# Patient Record
Sex: Male | Born: 1965 | Race: Black or African American | Hispanic: No | Marital: Married | Smoking: Current every day smoker
Health system: Southern US, Community
[De-identification: ages and names within clinical notes are randomized; demographics above are authoritative.]

## PROBLEM LIST (undated history)

## (undated) ENCOUNTER — Emergency Department (HOSPITAL_COMMUNITY)
Admission: EM | Disposition: A | Discharge status: Home / Self Care | Payer: Medicaid Other | Attending: Emergency Medicine | Admitting: Emergency Medicine

## (undated) DIAGNOSIS — E785 Hyperlipidemia, unspecified: Secondary | ICD-10-CM

## (undated) DIAGNOSIS — K852 Alcohol induced acute pancreatitis without necrosis or infection: Secondary | ICD-10-CM

## (undated) DIAGNOSIS — F329 Major depressive disorder, single episode, unspecified: Secondary | ICD-10-CM

## (undated) DIAGNOSIS — G8929 Other chronic pain: Secondary | ICD-10-CM

## (undated) DIAGNOSIS — F32A Depression, unspecified: Secondary | ICD-10-CM

## (undated) DIAGNOSIS — R519 Headache, unspecified: Secondary | ICD-10-CM

## (undated) DIAGNOSIS — G4730 Sleep apnea, unspecified: Secondary | ICD-10-CM

## (undated) DIAGNOSIS — I739 Peripheral vascular disease, unspecified: Secondary | ICD-10-CM

## (undated) DIAGNOSIS — M1990 Unspecified osteoarthritis, unspecified site: Secondary | ICD-10-CM

## (undated) DIAGNOSIS — E119 Type 2 diabetes mellitus without complications: Secondary | ICD-10-CM

## (undated) DIAGNOSIS — I10 Essential (primary) hypertension: Secondary | ICD-10-CM

## (undated) DIAGNOSIS — R51 Headache: Secondary | ICD-10-CM

## (undated) DIAGNOSIS — I639 Cerebral infarction, unspecified: Secondary | ICD-10-CM

## (undated) DIAGNOSIS — K219 Gastro-esophageal reflux disease without esophagitis: Secondary | ICD-10-CM

## (undated) DIAGNOSIS — I509 Heart failure, unspecified: Secondary | ICD-10-CM

## (undated) HISTORY — DX: Essential (primary) hypertension: I10

## (undated) HISTORY — DX: Cerebral infarction, unspecified: I63.9

## (undated) HISTORY — DX: Peripheral vascular disease, unspecified: I73.9

## (undated) HISTORY — DX: Alcohol induced acute pancreatitis without necrosis or infection: K85.20

## (undated) HISTORY — PX: OTHER SURGICAL HISTORY: SHX169

## (undated) HISTORY — DX: Other chronic pain: G89.29

## (undated) HISTORY — DX: Gastro-esophageal reflux disease without esophagitis: K21.9

## (undated) HISTORY — PX: CLOSED REDUCTION MANDIBULAR FRACTURE W/ ARCH BARS: SHX1360

---

## 2002-06-20 ENCOUNTER — Encounter: Payer: Self-pay | Admitting: *Deleted

## 2002-06-20 ENCOUNTER — Inpatient Hospital Stay (HOSPITAL_COMMUNITY): Admission: EM | Admit: 2002-06-20 | Discharge: 2002-06-21 | Payer: Self-pay

## 2002-06-21 ENCOUNTER — Encounter: Payer: Self-pay | Admitting: *Deleted

## 2002-06-24 ENCOUNTER — Ambulatory Visit (HOSPITAL_COMMUNITY): Admission: RE | Admit: 2002-06-24 | Discharge: 2002-06-24 | Payer: Self-pay | Admitting: Cardiovascular Disease

## 2003-08-09 ENCOUNTER — Emergency Department (HOSPITAL_COMMUNITY): Admission: EM | Admit: 2003-08-09 | Discharge: 2003-08-09 | Payer: Self-pay | Admitting: Emergency Medicine

## 2003-11-08 ENCOUNTER — Emergency Department (HOSPITAL_COMMUNITY): Admission: EM | Admit: 2003-11-08 | Discharge: 2003-11-08 | Payer: Self-pay | Admitting: Emergency Medicine

## 2005-07-30 ENCOUNTER — Emergency Department (HOSPITAL_COMMUNITY): Admission: EM | Admit: 2005-07-30 | Discharge: 2005-07-30 | Payer: Self-pay | Admitting: Emergency Medicine

## 2006-08-31 ENCOUNTER — Emergency Department (HOSPITAL_COMMUNITY): Admission: EM | Admit: 2006-08-31 | Discharge: 2006-08-31 | Payer: Self-pay | Admitting: Emergency Medicine

## 2006-09-20 ENCOUNTER — Emergency Department (HOSPITAL_COMMUNITY): Admission: EM | Admit: 2006-09-20 | Discharge: 2006-09-20 | Payer: Self-pay | Admitting: Emergency Medicine

## 2007-01-23 ENCOUNTER — Emergency Department (HOSPITAL_COMMUNITY): Admission: EM | Admit: 2007-01-23 | Discharge: 2007-01-23 | Payer: Self-pay | Admitting: Emergency Medicine

## 2007-02-16 ENCOUNTER — Emergency Department (HOSPITAL_COMMUNITY): Admission: EM | Admit: 2007-02-16 | Discharge: 2007-02-16 | Payer: Self-pay | Admitting: Emergency Medicine

## 2007-03-26 ENCOUNTER — Emergency Department (HOSPITAL_COMMUNITY): Admission: EM | Admit: 2007-03-26 | Discharge: 2007-03-26 | Payer: Self-pay | Admitting: Emergency Medicine

## 2007-04-30 ENCOUNTER — Emergency Department (HOSPITAL_COMMUNITY): Admission: EM | Admit: 2007-04-30 | Discharge: 2007-04-30 | Payer: Self-pay | Admitting: Emergency Medicine

## 2007-05-13 ENCOUNTER — Emergency Department (HOSPITAL_COMMUNITY): Admission: EM | Admit: 2007-05-13 | Discharge: 2007-05-13 | Payer: Self-pay | Admitting: Emergency Medicine

## 2007-06-16 ENCOUNTER — Emergency Department (HOSPITAL_COMMUNITY): Admission: EM | Admit: 2007-06-16 | Discharge: 2007-06-16 | Payer: Self-pay | Admitting: Emergency Medicine

## 2007-06-27 ENCOUNTER — Emergency Department (HOSPITAL_COMMUNITY): Admission: EM | Admit: 2007-06-27 | Discharge: 2007-06-27 | Payer: Self-pay | Admitting: *Deleted

## 2007-10-15 ENCOUNTER — Emergency Department (HOSPITAL_COMMUNITY): Admission: EM | Admit: 2007-10-15 | Discharge: 2007-10-16 | Payer: Self-pay | Admitting: Emergency Medicine

## 2008-06-23 ENCOUNTER — Emergency Department (HOSPITAL_COMMUNITY): Admission: EM | Admit: 2008-06-23 | Discharge: 2008-06-23 | Payer: Self-pay | Admitting: Emergency Medicine

## 2008-08-05 ENCOUNTER — Emergency Department (HOSPITAL_COMMUNITY): Admission: EM | Admit: 2008-08-05 | Discharge: 2008-08-05 | Payer: Self-pay | Admitting: Emergency Medicine

## 2008-09-27 ENCOUNTER — Emergency Department (HOSPITAL_COMMUNITY): Admission: EM | Admit: 2008-09-27 | Discharge: 2008-09-27 | Payer: Self-pay | Admitting: Emergency Medicine

## 2008-11-17 ENCOUNTER — Emergency Department (HOSPITAL_COMMUNITY): Admission: EM | Admit: 2008-11-17 | Discharge: 2008-11-17 | Payer: Self-pay | Admitting: Emergency Medicine

## 2008-12-07 ENCOUNTER — Ambulatory Visit (HOSPITAL_COMMUNITY): Admission: RE | Admit: 2008-12-07 | Discharge: 2008-12-07 | Payer: Self-pay | Admitting: Orthopaedic Surgery

## 2008-12-16 ENCOUNTER — Ambulatory Visit (HOSPITAL_COMMUNITY): Admission: RE | Admit: 2008-12-16 | Discharge: 2008-12-16 | Payer: Self-pay | Admitting: Orthopaedic Surgery

## 2009-02-07 ENCOUNTER — Inpatient Hospital Stay (HOSPITAL_COMMUNITY): Admission: RE | Admit: 2009-02-07 | Discharge: 2009-02-08 | Payer: Self-pay | Admitting: Orthopaedic Surgery

## 2009-02-20 ENCOUNTER — Encounter (HOSPITAL_COMMUNITY): Admission: RE | Admit: 2009-02-20 | Discharge: 2009-03-22 | Payer: Self-pay | Admitting: Orthopaedic Surgery

## 2009-02-26 ENCOUNTER — Emergency Department (HOSPITAL_COMMUNITY): Admission: EM | Admit: 2009-02-26 | Discharge: 2009-02-26 | Payer: Self-pay | Admitting: Emergency Medicine

## 2009-03-23 ENCOUNTER — Encounter (HOSPITAL_COMMUNITY): Admission: RE | Admit: 2009-03-23 | Discharge: 2009-05-03 | Payer: Self-pay | Admitting: Orthopaedic Surgery

## 2009-05-05 ENCOUNTER — Encounter (HOSPITAL_COMMUNITY): Admission: RE | Admit: 2009-05-05 | Discharge: 2009-06-04 | Payer: Self-pay | Admitting: Orthopaedic Surgery

## 2009-07-06 ENCOUNTER — Inpatient Hospital Stay (HOSPITAL_COMMUNITY): Admission: RE | Admit: 2009-07-06 | Discharge: 2009-07-07 | Payer: Self-pay | Admitting: Orthopaedic Surgery

## 2009-09-10 ENCOUNTER — Emergency Department (HOSPITAL_COMMUNITY): Admission: EM | Admit: 2009-09-10 | Discharge: 2009-09-10 | Payer: Self-pay | Admitting: Emergency Medicine

## 2009-09-11 ENCOUNTER — Emergency Department (HOSPITAL_COMMUNITY): Admission: EM | Admit: 2009-09-11 | Discharge: 2009-09-11 | Payer: Self-pay | Admitting: Emergency Medicine

## 2009-09-12 ENCOUNTER — Emergency Department (HOSPITAL_COMMUNITY): Admission: EM | Admit: 2009-09-12 | Discharge: 2009-09-12 | Payer: Self-pay | Admitting: Emergency Medicine

## 2009-12-07 ENCOUNTER — Emergency Department (HOSPITAL_COMMUNITY): Admission: EM | Admit: 2009-12-07 | Discharge: 2009-12-07 | Payer: Self-pay | Admitting: Emergency Medicine

## 2010-03-27 ENCOUNTER — Encounter (HOSPITAL_COMMUNITY): Admission: RE | Admit: 2010-03-27 | Discharge: 2010-04-26 | Payer: Self-pay | Admitting: Orthopaedic Surgery

## 2010-04-27 ENCOUNTER — Encounter (HOSPITAL_COMMUNITY): Admission: RE | Admit: 2010-04-27 | Discharge: 2010-05-27 | Payer: Self-pay | Admitting: Orthopaedic Surgery

## 2010-08-20 ENCOUNTER — Emergency Department (HOSPITAL_COMMUNITY): Admission: EM | Admit: 2010-08-20 | Discharge: 2010-08-20 | Payer: Self-pay | Admitting: Emergency Medicine

## 2010-11-20 ENCOUNTER — Emergency Department (HOSPITAL_COMMUNITY)
Admission: EM | Admit: 2010-11-20 | Discharge: 2010-11-20 | Payer: Self-pay | Source: Home / Self Care | Admitting: Emergency Medicine

## 2010-12-30 ENCOUNTER — Encounter: Payer: Self-pay | Admitting: Orthopaedic Surgery

## 2011-01-04 ENCOUNTER — Emergency Department (HOSPITAL_COMMUNITY)
Admission: EM | Admit: 2011-01-04 | Discharge: 2011-01-04 | Payer: Self-pay | Source: Home / Self Care | Admitting: Emergency Medicine

## 2011-01-12 ENCOUNTER — Emergency Department (HOSPITAL_COMMUNITY)
Admission: EM | Admit: 2011-01-12 | Discharge: 2011-01-12 | Disposition: A | Discharge status: Home / Self Care | Payer: Medicaid Other | Attending: Emergency Medicine | Admitting: Emergency Medicine

## 2011-01-12 ENCOUNTER — Emergency Department (HOSPITAL_COMMUNITY): Admit: 2011-01-12 | Discharge: 2011-01-12 | Disposition: A | Discharge status: Home / Self Care | Payer: Medicaid Other

## 2011-01-12 DIAGNOSIS — R05 Cough: Secondary | ICD-10-CM | POA: Insufficient documentation

## 2011-01-12 DIAGNOSIS — M549 Dorsalgia, unspecified: Secondary | ICD-10-CM | POA: Insufficient documentation

## 2011-01-12 DIAGNOSIS — R059 Cough, unspecified: Secondary | ICD-10-CM | POA: Insufficient documentation

## 2011-01-12 DIAGNOSIS — I10 Essential (primary) hypertension: Secondary | ICD-10-CM | POA: Insufficient documentation

## 2011-01-12 DIAGNOSIS — Z79899 Other long term (current) drug therapy: Secondary | ICD-10-CM | POA: Insufficient documentation

## 2011-01-12 DIAGNOSIS — R0602 Shortness of breath: Secondary | ICD-10-CM | POA: Insufficient documentation

## 2011-02-06 ENCOUNTER — Encounter (HOSPITAL_COMMUNITY): Payer: Medicaid Other

## 2011-02-06 ENCOUNTER — Other Ambulatory Visit: Payer: Self-pay | Admitting: General Surgery

## 2011-02-06 LAB — BASIC METABOLIC PANEL
Calcium: 9.2 mg/dL (ref 8.4–10.5)
GFR calc Af Amer: >60 mL/min (ref >60)
GFR calc non Af Amer: >60 mL/min (ref >60)
Glucose, Bld: 89 mg/dL (ref 70–99)
Sodium: 138 mEq/L (ref 135–145)

## 2011-02-06 LAB — CBC
HCT: 44.6 % (ref 39.0–52.0)
MCHC: 33.6 g/dL (ref 30.0–36.0)
RDW: 13.7 % (ref 11.5–15.5)

## 2011-02-13 ENCOUNTER — Other Ambulatory Visit: Payer: Self-pay | Admitting: General Surgery

## 2011-02-13 ENCOUNTER — Ambulatory Visit (HOSPITAL_COMMUNITY)
Admission: RE | Admit: 2011-02-13 | Discharge: 2011-02-13 | Disposition: A | Discharge status: Home / Self Care | Payer: Medicaid Other | Source: Ambulatory Visit | Attending: General Surgery | Admitting: General Surgery

## 2011-02-13 DIAGNOSIS — A630 Anogenital (venereal) warts: Secondary | ICD-10-CM | POA: Insufficient documentation

## 2011-02-13 DIAGNOSIS — I10 Essential (primary) hypertension: Secondary | ICD-10-CM | POA: Insufficient documentation

## 2011-02-13 DIAGNOSIS — Z79899 Other long term (current) drug therapy: Secondary | ICD-10-CM | POA: Insufficient documentation

## 2011-03-02 ENCOUNTER — Emergency Department (HOSPITAL_COMMUNITY)
Admission: EM | Admit: 2011-03-02 | Discharge: 2011-03-03 | Disposition: A | Discharge status: Home / Self Care | Payer: Medicaid Other | Source: Home / Self Care | Attending: Emergency Medicine | Admitting: Emergency Medicine

## 2011-03-02 DIAGNOSIS — Y838 Other surgical procedures as the cause of abnormal reaction of the patient, or of later complication, without mention of misadventure at the time of the procedure: Secondary | ICD-10-CM | POA: Insufficient documentation

## 2011-03-02 DIAGNOSIS — IMO0002 Reserved for concepts with insufficient information to code with codable children: Secondary | ICD-10-CM | POA: Insufficient documentation

## 2011-03-02 DIAGNOSIS — Y849 Medical procedure, unspecified as the cause of abnormal reaction of the patient, or of later complication, without mention of misadventure at the time of the procedure: Secondary | ICD-10-CM | POA: Insufficient documentation

## 2011-03-02 DIAGNOSIS — S3130XA Unspecified open wound of scrotum and testes, initial encounter: Secondary | ICD-10-CM | POA: Insufficient documentation

## 2011-03-03 ENCOUNTER — Emergency Department (HOSPITAL_COMMUNITY)
Admission: EM | Admit: 2011-03-03 | Discharge: 2011-03-03 | Disposition: A | Discharge status: Home / Self Care | Payer: Medicaid Other | Attending: Emergency Medicine | Admitting: Emergency Medicine

## 2011-03-17 LAB — COMPREHENSIVE METABOLIC PANEL
AST: 43 U/L — ABNORMAL HIGH (ref 0–37)
Albumin: 4 g/dL (ref 3.5–5.2)
Calcium: 9.7 mg/dL (ref 8.4–10.5)
Chloride: 107 mEq/L (ref 96–112)
Creatinine, Ser: 0.97 mg/dL (ref 0.4–1.5)
GFR calc Af Amer: >60 mL/min (ref >60)
Total Bilirubin: 0.5 mg/dL (ref 0.3–1.2)
Total Protein: 6.8 g/dL (ref 6.0–8.3)

## 2011-03-17 LAB — URINALYSIS, MICROSCOPIC ONLY
Glucose, UA: NEGATIVE mg/dL
Ketones, ur: NEGATIVE mg/dL
Specific Gravity, Urine: >1.03 — ABNORMAL HIGH (ref 1.005–1.030)
pH: 5.5 (ref 5.0–8.0)

## 2011-03-17 LAB — CBC
MCV: 93.3 fL (ref 78.0–100.0)
Platelets: 205 10*3/uL (ref 150–400)
RDW: 14.7 % (ref 11.5–15.5)
WBC: 5.3 10*3/uL (ref 4.0–10.5)

## 2011-03-17 LAB — DIFFERENTIAL
Eosinophils Relative: 1 % (ref 0–5)
Lymphocytes Relative: 27 % (ref 12–46)
Lymphs Abs: 1.4 10*3/uL (ref 0.7–4.0)
Monocytes Absolute: 0.3 10*3/uL (ref 0.1–1.0)
Monocytes Relative: 5 % (ref 3–12)

## 2011-03-17 LAB — GLUCOSE, CAPILLARY: Glucose-Capillary: 106 mg/dL — ABNORMAL HIGH (ref 70–99)

## 2011-03-26 LAB — CBC
MCV: 92.2 fL (ref 78.0–100.0)
Platelets: 237 10*3/uL (ref 150–400)
RDW: 13.6 % (ref 11.5–15.5)
WBC: 5.4 10*3/uL (ref 4.0–10.5)

## 2011-03-26 LAB — COMPREHENSIVE METABOLIC PANEL
AST: 39 U/L — ABNORMAL HIGH (ref 0–37)
Albumin: 4.1 g/dL (ref 3.5–5.2)
Chloride: 103 mEq/L (ref 96–112)
Creatinine, Ser: 0.89 mg/dL (ref 0.4–1.5)
GFR calc Af Amer: >60 mL/min (ref >60)
Potassium: 3.9 mEq/L (ref 3.5–5.1)
Total Bilirubin: 0.5 mg/dL (ref 0.3–1.2)
Total Protein: 6.6 g/dL (ref 6.0–8.3)

## 2011-03-26 LAB — URINALYSIS, ROUTINE W REFLEX MICROSCOPIC
Bilirubin Urine: NEGATIVE
Glucose, UA: NEGATIVE mg/dL
Ketones, ur: NEGATIVE mg/dL
Nitrite: NEGATIVE
Specific Gravity, Urine: >1.03 — ABNORMAL HIGH (ref 1.005–1.030)
pH: 5.5 (ref 5.0–8.0)

## 2011-03-26 LAB — DIFFERENTIAL
Basophils Absolute: 0 10*3/uL (ref 0.0–0.1)
Eosinophils Relative: 1 % (ref 0–5)
Lymphocytes Relative: 30 % (ref 12–46)
Monocytes Absolute: 0.4 10*3/uL (ref 0.1–1.0)
Monocytes Relative: 8 % (ref 3–12)
Neutro Abs: 3.3 10*3/uL (ref 1.7–7.7)

## 2011-04-01 ENCOUNTER — Emergency Department (HOSPITAL_COMMUNITY): Payer: Medicaid Other

## 2011-04-01 ENCOUNTER — Emergency Department (HOSPITAL_COMMUNITY)
Admission: EM | Admit: 2011-04-01 | Discharge: 2011-04-02 | Disposition: A | Discharge status: Home / Self Care | Payer: Medicaid Other | Attending: Emergency Medicine | Admitting: Emergency Medicine

## 2011-04-01 DIAGNOSIS — H5316 Psychophysical visual disturbances: Secondary | ICD-10-CM | POA: Insufficient documentation

## 2011-04-01 DIAGNOSIS — Z79899 Other long term (current) drug therapy: Secondary | ICD-10-CM | POA: Insufficient documentation

## 2011-04-01 DIAGNOSIS — G8929 Other chronic pain: Secondary | ICD-10-CM | POA: Insufficient documentation

## 2011-04-01 DIAGNOSIS — R51 Headache: Secondary | ICD-10-CM | POA: Insufficient documentation

## 2011-04-01 DIAGNOSIS — R079 Chest pain, unspecified: Secondary | ICD-10-CM | POA: Insufficient documentation

## 2011-04-01 DIAGNOSIS — R404 Transient alteration of awareness: Secondary | ICD-10-CM | POA: Insufficient documentation

## 2011-04-01 DIAGNOSIS — I10 Essential (primary) hypertension: Secondary | ICD-10-CM | POA: Insufficient documentation

## 2011-04-01 DIAGNOSIS — G4730 Sleep apnea, unspecified: Secondary | ICD-10-CM | POA: Insufficient documentation

## 2011-04-01 DIAGNOSIS — M549 Dorsalgia, unspecified: Secondary | ICD-10-CM | POA: Insufficient documentation

## 2011-04-01 LAB — BASIC METABOLIC PANEL
CO2: 26 mEq/L (ref 19–32)
Chloride: 106 mEq/L (ref 96–112)
GFR calc non Af Amer: >60 mL/min (ref >60)
Glucose, Bld: 125 mg/dL — ABNORMAL HIGH (ref 70–99)
Potassium: 3.5 mEq/L (ref 3.5–5.1)
Sodium: 139 mEq/L (ref 135–145)

## 2011-04-01 LAB — URINALYSIS, ROUTINE W REFLEX MICROSCOPIC
Glucose, UA: NEGATIVE mg/dL
Hgb urine dipstick: NEGATIVE
Ketones, ur: NEGATIVE mg/dL
Leukocytes, UA: NEGATIVE
Protein, ur: 30 mg/dL — AB

## 2011-04-01 LAB — CBC
HCT: 45.3 % (ref 39.0–52.0)
Hemoglobin: 15.9 g/dL (ref 13.0–17.0)
MCV: 92.6 fL (ref 78.0–100.0)
Platelets: 220 10*3/uL (ref 150–400)
RBC: 4.89 MIL/uL (ref 4.22–5.81)
WBC: 7 10*3/uL (ref 4.0–10.5)

## 2011-04-01 LAB — DIFFERENTIAL
Lymphocytes Relative: 19 % (ref 12–46)
Lymphs Abs: 1.3 10*3/uL (ref 0.7–4.0)
Neutro Abs: 5 10*3/uL (ref 1.7–7.7)
Neutrophils Relative %: 71 % (ref 43–77)

## 2011-04-01 LAB — RAPID URINE DRUG SCREEN, HOSP PERFORMED
Benzodiazepines: NOT DETECTED
Cocaine: NOT DETECTED

## 2011-04-01 LAB — POCT CARDIAC MARKERS
CKMB, poc: 2.4 ng/mL (ref 1.0–8.0)
Myoglobin, poc: 151 ng/mL (ref 12–200)
Troponin i, poc: <0.05 ng/mL (ref 0.00–0.09)

## 2011-04-04 ENCOUNTER — Emergency Department (HOSPITAL_COMMUNITY): Payer: Medicaid Other

## 2011-04-04 ENCOUNTER — Emergency Department (HOSPITAL_COMMUNITY)
Admission: EM | Admit: 2011-04-04 | Discharge: 2011-04-04 | Disposition: A | Discharge status: Home / Self Care | Payer: Medicaid Other | Attending: Emergency Medicine | Admitting: Emergency Medicine

## 2011-04-04 DIAGNOSIS — X58XXXA Exposure to other specified factors, initial encounter: Secondary | ICD-10-CM | POA: Insufficient documentation

## 2011-04-04 DIAGNOSIS — S335XXA Sprain of ligaments of lumbar spine, initial encounter: Secondary | ICD-10-CM | POA: Insufficient documentation

## 2011-04-08 NOTE — Op Note (Signed)
  NAME:  Taylor Obrien, Taylor Obrien NO.:  [id number]  MEDICAL RECORD NO.:  [id number]           PATIENT TYPE:  O  LOCATION:  DAYP                          FACILITY:  [facility]  PHYSICIAN:  Tilford Pillar, MD      DATE OF BIRTH:  1966-10-12  DATE OF PROCEDURE:  02/13/2011 DATE OF DISCHARGE:  02/13/2011                              OPERATIVE REPORT   PREOPERATIVE DIAGNOSIS:  Condyloma of the penis and perineum.  POSTOPERATIVE DIAGNOSIS:  Condyloma of the penis and perineum.  PROCEDURE:  Electrocautery, desiccation of condylomatous warts of the penis and perineum.  SURGEON:  Tilford Pillar, MD  ANESTHESIA:  General endotracheal, local anesthetic 0.5% Sensorcaine plain.  SPECIMEN:  Pedunculated wart sent as permanent specimen to Pathology.  ESTIMATED BLOOD LOSS:  Minimal  DISPOSITION:  Postanesthetic Care Unit.  INDICATIONS:  The patient is a 45 year old male who presented to my office with a history of multiple genitalia warts easily been increasing in size.  He have 1 area which was causing some discomfort, pain, and occasional bleeding.  On evaluation he had multiple warts including one large pedunculated wart of the penile shaft.  Risks, benefits and alternatives of excision and desiccation were discussed at length with the patient including but not limited to the risk of recurrence, bleeding, infection as well as postoperative pain.  His questions and concerns were addressed and the patient was consented for the planned procedure.  OPERATION:  The patient was taken to the operating room.  He was placed in supine position on the operating room table, at which time the general anesthetic was administered.  Once the patient was asleep, he was endotracheally intubated by Anesthesia.  At this point he was placed into a high lithotomy position in bilateral Yellofin stirrups.  His perineum was prepped with Betadine solution and draped in standard fashion.  At this point we  began careful dissection with electrocautery. The pedunculated wart was divided and excised and sent as a permanent specimen.  Multiple warts were meticulously desiccated with the electrocautery.  Due to the number of warts, this process did take a long duration to desiccate all clearly evidencing gross-appearing warts. At this point there was no bleeding.  The area was covered with Silvadene cream and the gauze dressing was placed as best as possible around the genitalia as well as the perineum.  The drapes were removed. The patient was taken down the Yellofin stirrups, placed into the mesh underwear to help secure the dressing and then was transferred back to a regular hospital bed, then transferred to the postanesthetic care unit in stable condition.  At the conclusion of procedure, all instrument, sponge and needle counts were correct.  The patient tolerated the procedure well.     Tilford Pillar, MD     BZ/MEDQ  D:  04/02/2011  T:  04/03/2011  Job:  [id number]  Electronically Signed by Tilford Pillar MD on 04/08/2011 02:24:41 PM

## 2011-04-23 NOTE — Op Note (Signed)
NAME:  Taylor Obrien, Taylor Obrien NO.:  [id number]   MEDICAL RECORD NO.:  [id number]          PATIENT TYPE:  INP   LOCATION:  A306                          FACILITY:  [facility]   PHYSICIAN:  J. Darreld Mclean, M.D. DATE OF BIRTH:  March 24, 1966   DATE OF PROCEDURE:  02/07/2009  DATE OF DISCHARGE:                               OPERATIVE REPORT   PREOPERATIVE DIAGNOSIS:  Tear right rotator cuff.   POSTOPERATIVE DIAGNOSIS:  Tear right rotator cuff.   PROCEDURE:  Open repair right rotator cuff modified Neer acromioplasty  piece of surgical anchor.   ANESTHESIA:  General.   TOURNIQUET:  No tourniquet.   DRAINS:  No drains.   SURGEON:  J. Darreld Mclean, MD   INDICATIONS:  The patient is a 45 year old male with pain and tenderness  in his right shoulder.  MRI shows tear on the right shoulder of the  supraspinatus at its insertion.  Distractor surgery was not successful,  so it is not recommended.  Risks and imponderables of the procedure have  been explained as well as alternatives.   DESCRIPTION OF PROCEDURE:  The patient was seen in the holding area.  The right shoulder was identified as correct surgical site.  We put a  mark on the right shoulder, placed a mark on the right shoulder.  He was  taken to the OR and given general anesthesia while supine.  Sandbag  placed under the right shoulder under the padding of the table.  He was  positioned in a semi-barber position.  He was prepped and draped in the  usual manner.  We had a time-out, identified Taylor Obrien as the patient,  right shoulder as the correct surgical site.  All instrumentation was  was deemed to be properly working.  Surgical team was prepared.  Incision was made between the end of the acromion and the coracoid.  Careful dissection of the deltoid was opened and so-called weak spot was  opened.  Coracoacromial ligament was identified and removed.  Modified  Neer acromioplasty was carried out with broad-based  osteotome.  A power  rasp was used to smooth this out.  Tear with insertion of the  supraspinatus and this was repaired with a surgical anchor.  Good repair  was obtained.  The shoulder was moved.  We have no significant  difficulty or impingement.  The deltoid was repaired with 2-0 chromic  described by near.  Subcutaneous tissue were closed with interrupted 2-0  plain and a running subcuticular 3-0 nylon was used.  Steri-Strips  reinforced to one.  Sterile dressing applied, bulky dressing applied.  It should be noted I  used Marcaine 0.25% into the wound prior to application of the Steri-  Strips.  The patient tolerated the procedure well.  He will be admitted  overnight for pain control.           ______________________________  J. Darreld Mclean, M.D.     JWK/MEDQ  D:  02/07/2009  T:  02/07/2009  Job:  [id number]
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
## 2011-04-23 NOTE — H&P (Signed)
NAME:  Taylor Obrien, Taylor Obrien                 ACCOUNT NO.:  [id number]   MEDICAL RECORD NO.:  [id number]          PATIENT TYPE:  AMB   LOCATION:  DAY                           FACILITY:  [facility]   PHYSICIAN:  J. Darreld Mclean, M.D. DATE OF BIRTH:  May 20, 1966   DATE OF ADMISSION:  07/05/2009  DATE OF DISCHARGE:  LH                              HISTORY & PHYSICAL   CHIEF COMPLAINT:  My shoulder hurts.   SUBJECTIVE:  The patient is a 45 year old male who I did surgery on his  right rotator cuff on 02/07/2009 earlier this year.  He has done well  and has progressed well, but just recently he fell.  He is still having  some pain again in the right shoulder.  Obtained x-rays and the surgical  anchor that I placed had come loose, previously it was well-located.  He  is having pain with motion of his shoulder.  I have shown him the x-  rays, explained what has happened.  The anchor will need to be removed  because it is now a loose body within the joint.  I told him I would do  the surgery, put him in the hospital overnight for pain control and can  let him go the next day.   PAST MEDICAL HISTORY:  1. Positive for a problem with his neck.  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  MRI shows this and it is stable.  2. The patient has a history of hypertension.   ALLERGIES:  He has no allergies.   SOCIAL HISTORY:  He smokes and uses alcohol socially.  His family doctor  is [facility].  The patient lives in [place].  Is  married.  He works for the [employer].   CURRENT MEDICATIONS:  1. Benazepril 40 mg daily.  2. Flexeril 10 mg as needed.  3. Amlodipine 10 mg daily.  4. Labetalol 200 mg daily.  5. The patient has recently been on some mild hydrocodone, 7.5.   OBJECTIVE:  VITAL SIGNS:  The patient is 5 feet 6 inches, weight 225  pounds, pulse 84, respirations 16, afebrile.  GENERAL:  The  patient is alert, cooperative and oriented.  HEENT: Negative.  NECK:  Supple.  LUNGS:  Clear to P and A.  HEART: Regular without murmur heard.  ABDOMEN:  Soft, nontender without masses.  He is obese, stocky built.  EXTREMITIES:  Range of motion of the right shoulder is good.  He can  abduct to about 90-100 degrees, forward flex to 120, but has pain and  will wince at times, other times he does not.  He has a well-healed  scar.  Neurovascularly is intact.  Other extremities are negative.  CNS: Intact.  SKIN:  Intact.   IMPRESSION:  Status post right rotator cuff tear with loose surgical  anchor.   PLAN:  Removal of surgical anchor.  I will do this under general  anesthesia.  Admit the patient  overnight for pain control.  Labs are  pending.                                            ______________________________  J. Darreld Mclean, M.D.     JWK/MEDQ  D:  07/05/2009  T:  07/05/2009  Job:  [id number]

## 2011-04-23 NOTE — Discharge Summary (Signed)
NAME:  Taylor Obrien, SUSTAITA NO.:  [id number]   MEDICAL RECORD NO.:  [id number]          PATIENT TYPE:  INP   LOCATION:  A306                          FACILITY:  [facility]   PHYSICIAN:  J. Darreld Mclean, M.D. DATE OF BIRTH:  1966-05-18   DATE OF ADMISSION:  02/07/2009  DATE OF DISCHARGE:  LH                               DISCHARGE SUMMARY   DISCHARGE DIAGNOSIS:  Rotator cuff tear, right.   PROCEDURE PERFORMED:  Open repair of rotator cuff tear on the right.   DISCHARGE ASSESSMENT:  Appropriate.   PROGNOSIS:  Good.   DISPOSITION:  To home.  To be seen in my office on March 15 at 9:00 for  suture removal.   WOUND CARE:  Thin dressing applied.  To keep the wound dry.  Use of  Cryo/Cuff.  He will be shown how to use the Cryo/Cuff prior to  discharge.   DISCHARGE MEDICATIONS:  1. Percocet 7.5/325 p.o. q.4h. has been added.  2. He is to stop his hydrocodone.  3. Resume all the other medicines.  Please see medical reconciliation      sheet.   BRIEF HISTORY:  The patient was admitted and underwent the above-  mentioned procedure.  He tolerated it well.  Postop, his pain has been  well-controlled with a PCA pump.  Neurovascular is intact.  He slept  well.  Neurovascular remained intact.  He remained afebrile.  Vital  signs are stable.  Care of the wound is as stated.  I will see him in  the office on March 15.  If any difficulties, to contact me through the  office or hospital.  The beeper system numbers have been provided.                                            ______________________________  Shela Commons. Darreld Mclean, M.D.     JWK/MEDQ  D:  02/08/2009  T:  02/08/2009  Job:  [id number]

## 2011-04-23 NOTE — Op Note (Signed)
NAME:  Taylor Obrien, Taylor Obrien                 ACCOUNT NO.:  [id number]   MEDICAL RECORD NO.:  [id number]          PATIENT TYPE:  INP   LOCATION:  A308                          FACILITY:  [facility]   PHYSICIAN:  J. Darreld Mclean, M.D. DATE OF BIRTH:  10-23-1966   DATE OF PROCEDURE:  07/06/2009  DATE OF DISCHARGE:                               OPERATIVE REPORT   PREOPERATIVE DIAGNOSIS:  Status post rotator cuff repair on the right  shoulder now with loose surgical anchor within the right shoulder joint.   POSTOPERATIVE DIAGNOSIS:  Status post rotator cuff repair on the right  shoulder now with loose surgical anchor within the right shoulder joint.   PROCEDURE:  Removal of foreign body (loose surgical anchor) right  shoulder.   ANESTHESIA:  General.   SURGEON:  J. Darreld Mclean, MD   DRAINS:  No drains.   INDICATIONS:  The patient is a 45 year old male who had shoulder surgery  in March.  He had a rotator cuff tear.  He has done very well until  recently when he fell.  He had increased pain and tenderness to his  shoulder.  His motion have been very good and his strength was  returning.  I got x-ray of the office last week and showed that the  surgical anchor was displaced within the joint.  I recommended this  removal.  The patient thought about it over the weekend and came back  and saw me Monday and elected to have the ankle removed.  I told him it  has an impingement on all loose body including the joint.  Risks and  imponderables procedure were discussed preoperatively at that time.  He  asked appropriate questions and appeared to understand.  I told him I  got to limit the incision as small as possible so that we can get the  anchor out or reopen the entire wound as originally done.   DESCRIPTION OF PROCEDURE:  The patient was seen in the holding area.  The right shoulder was identified as correct surgical site.  He placed a  mark on the right shoulder.  I placed a mark on the right  shoulder.  He  was brought to the operating room and given general anesthesia while  supine.  He was placed in the semi-barber position on the table.  He was  prepped and draped in the usual manner.  We had a time-out, identified  the patient as Mr. Maragh and the right shoulder as the correct surgical  site.  All instrumentation was deemed to be properly positioned and  working.  Operating room team knew each other.  A C-arm was brought in  first, but due to the metal on the table, we could not visualize the  shoulder well, therefore this was removed and regular x-ray was brought  in.  I got to scalp film after he was prepped and draped and I put 3  needles and tried to localize where the anchor was.  It was closed with  the largest needle.  We made a small incision over this area, but  I  could not readily identify.  Then repeated x-rays again with needles in  place and then got also a lateral view.  This isolated it better.  It  took a little while, but with careful dissection, I was able to find the  anchor and removed it.  Part of the tear was still attached and I  removed this portion of the suture.  Rotator cuff appeared to be intact  and was one area, of course, where the anchor had come through, but  other than that the rotator cuff looked good.  Repaired this with  Surgilon suture #1.  The deltoid was repaired with 2-0 chromic.  The  subcutaneous tissues reapproximated using 2-0 chromic and then a  running subcuticular 3-0 nylon.  Sterile dressing applied.  The patient  tolerated the procedure well, went to recovery in good condition.  He  had been placed in the hospital overnight on observation to make sure  there is pain control.  We will let him to go home tomorrow if he is  doing well.            ______________________________  J. Darreld Mclean, M.D.     JWK/MEDQ  D:  07/06/2009  T:  07/07/2009  Job:  [id number]

## 2011-04-23 NOTE — Discharge Summary (Signed)
NAME:  Taylor Obrien, Taylor Obrien                 ACCOUNT NO.:  [id number]   MEDICAL RECORD NO.:  [id number]          PATIENT TYPE:  INP   LOCATION:  A308                          FACILITY:  [facility]   PHYSICIAN:  J. Darreld Mclean, M.D. DATE OF BIRTH:  12/13/1965   DATE OF ADMISSION:  07/06/2009  DATE OF DISCHARGE:  07/30/2010LH                               DISCHARGE SUMMARY   DISCHARGE DIAGNOSIS:  Status post rotator cuff repair on the right with  a loose orthopedic device (surgical anchor).   DISCHARGE STATUS:  Improved and sent home.   DISCHARGE MEDICATIONS:  Percocet 7.5/325 one every 2 hours p.r.n. pain.   PROCEDURE PERFORMED:  Removal of surgical anchor in the right shoulder.   The patient was seen in my office in approximately 10 days, 2 weeks.   Wound care has been explained.   Begin gentle range of motion as tolerated.   Use a sling as tolerated.   BRIEF HISTORY:  The patient had surgery on his right shoulder for  rotator cuff repair in March, he did well, went to physical therapy and  was progressing nicely.  Several weeks ago, he fell, injured his right  shoulder, he had increasing pain.  X-rays revealed the surgical anchor  come out, it was loose within the joint.  I recommended removal.  He  agreed.  He was admitted yesterday for surgery and he underwent the  procedure, anchor was removed without difficulty.  He has done well last  night.  Pain is well-controlled.  Neurovascular is intact.   He understands wound care, begin gentle range of motion as tolerated.  I  will see him in the office as stated.  If difficulty, contact me, labs  were negative.                                            ______________________________  J. Darreld Mclean, M.D.     JWK/MEDQ  D:  07/07/2009  T:  07/07/2009  Job:  [id number]

## 2011-04-26 NOTE — Procedures (Signed)
[facility]  Patient:    SOLON, ALBAN Visit Number: [id number] MRN: [id number]          Service Type: OUT Location: RAD Attending Physician:  Burnetta Sabin Dictated by:   Slater Bing, M.D. Proc. Date: 06/24/02 Admit Date:  06/24/2002 Discharge Date: 06/24/2002                              Echocardiograms  CLINICAL DATA:  The patient is a 45 year old gentleman with hypertension, cardiomegaly, and an abnormal EKG.  IMPRESSION: 1. Technically adequate echocardiographic study. 2. Mild left atrial enlargement; normal right atrium and right ventricle. 3. Normal mitral, tricuspid, and pulmonic valves. 4. Probably normal aortic valve; number of leaflets cannot be unequivocally    determined but probably trileaflet. 5. Normal internal dimension of the left ventricle; moderate concentric LVH.    Normal regional and global LV systolic function. 6. Normal IVC. Dictated by:   Lincoln Bing, M.D. Attending Physician:  Burnetta Sabin DD:  06/24/02 TD:  06/30/02 Job: [id number]

## 2011-05-22 ENCOUNTER — Emergency Department (HOSPITAL_COMMUNITY)
Admission: EM | Admit: 2011-05-22 | Discharge: 2011-05-22 | Disposition: A | Discharge status: Home / Self Care | Payer: Medicaid Other | Attending: Emergency Medicine | Admitting: Emergency Medicine

## 2011-05-22 DIAGNOSIS — M545 Low back pain, unspecified: Secondary | ICD-10-CM | POA: Insufficient documentation

## 2011-05-22 DIAGNOSIS — G4730 Sleep apnea, unspecified: Secondary | ICD-10-CM | POA: Insufficient documentation

## 2011-05-22 DIAGNOSIS — F17200 Nicotine dependence, unspecified, uncomplicated: Secondary | ICD-10-CM | POA: Insufficient documentation

## 2011-05-22 DIAGNOSIS — I10 Essential (primary) hypertension: Secondary | ICD-10-CM | POA: Insufficient documentation

## 2011-07-25 ENCOUNTER — Emergency Department (HOSPITAL_COMMUNITY)
Admission: EM | Admit: 2011-07-25 | Discharge: 2011-07-25 | Disposition: A | Discharge status: Other Acute Inpatient Hospital | Payer: Medicaid Other | Attending: Emergency Medicine | Admitting: Emergency Medicine

## 2011-07-25 DIAGNOSIS — E119 Type 2 diabetes mellitus without complications: Secondary | ICD-10-CM | POA: Insufficient documentation

## 2011-07-25 DIAGNOSIS — M549 Dorsalgia, unspecified: Secondary | ICD-10-CM | POA: Insufficient documentation

## 2011-07-25 DIAGNOSIS — R45851 Suicidal ideations: Secondary | ICD-10-CM | POA: Insufficient documentation

## 2011-07-25 DIAGNOSIS — I10 Essential (primary) hypertension: Secondary | ICD-10-CM | POA: Insufficient documentation

## 2011-07-25 DIAGNOSIS — E785 Hyperlipidemia, unspecified: Secondary | ICD-10-CM | POA: Insufficient documentation

## 2011-07-25 DIAGNOSIS — F17200 Nicotine dependence, unspecified, uncomplicated: Secondary | ICD-10-CM | POA: Insufficient documentation

## 2011-07-25 HISTORY — DX: Hyperlipidemia, unspecified: E78.5

## 2011-07-25 LAB — CBC
HCT: 46.3 % (ref 39.0–52.0)
Hemoglobin: 16.2 g/dL (ref 13.0–17.0)
MCH: 33.2 pg (ref 26.0–34.0)
MCHC: 35 g/dL (ref 30.0–36.0)

## 2011-07-25 LAB — BASIC METABOLIC PANEL
BUN: 14 mg/dL (ref 6–23)
Chloride: 103 mEq/L (ref 96–112)
Glucose, Bld: 87 mg/dL (ref 70–99)
Potassium: 3.6 mEq/L (ref 3.5–5.1)

## 2011-07-25 LAB — URINALYSIS, ROUTINE W REFLEX MICROSCOPIC
Bilirubin Urine: NEGATIVE
Nitrite: NEGATIVE
Specific Gravity, Urine: 1.03 (ref 1.005–1.030)
Urobilinogen, UA: 0.2 mg/dL (ref 0.0–1.0)

## 2011-07-25 LAB — DIFFERENTIAL
Basophils Relative: 0 % (ref 0–1)
Eosinophils Absolute: 0.1 10*3/uL (ref 0.0–0.7)
Monocytes Absolute: 0.6 10*3/uL (ref 0.1–1.0)
Monocytes Relative: 8 % (ref 3–12)

## 2011-07-25 LAB — ETHANOL: Alcohol, Ethyl (B): <11 mg/dL (ref 0–11)

## 2011-07-25 LAB — URINE MICROSCOPIC-ADD ON

## 2011-07-25 LAB — RAPID URINE DRUG SCREEN, HOSP PERFORMED: Barbiturates: NOT DETECTED

## 2011-07-25 MED ORDER — BENAZEPRIL HCL 10 MG PO TABS
40.0000 mg | ORAL_TABLET | Freq: Every day | ORAL | Status: DC
  Administered 2011-07-25: 40 mg via ORAL
  Filled 2011-07-25: qty 4

## 2011-07-25 MED ORDER — ACETAMINOPHEN 325 MG PO TABS
650.0000 mg | ORAL_TABLET | ORAL | Status: DC | PRN
  Administered 2011-07-25: 650 mg via ORAL
  Filled 2011-07-25: qty 2

## 2011-07-25 MED ORDER — LORAZEPAM 1 MG PO TABS: 1.0000 mg | ORAL_TABLET | Freq: Three times a day (TID) | ORAL | Status: DC | PRN

## 2011-07-25 MED ORDER — AMLODIPINE BESYLATE 5 MG PO TABS
10.0000 mg | ORAL_TABLET | Freq: Every day | ORAL | Status: DC
  Administered 2011-07-25: 10 mg via ORAL
  Filled 2011-07-25: qty 2

## 2011-07-25 NOTE — ED Notes (Signed)
Pt brought in by [employer].  Was [place] the train tracks stating " I want to end my life, I can't deal with it anymore".  [employer] offered to help and got him to come here to the hospital.

## 2011-07-25 NOTE — ED Notes (Signed)
Pt has hx of depression but has been unable to afford his medication. Pt reports being found standing at the train tracks this a.m by [employer].  Pt reports wanting to jump in front of the train.  Pt states " it is my destiny to be hit by that train".  Pt reports " i have bills, family, and demons that make me want to kill myself".  Pt admits to taking street drugs for relief of symptoms.  Pt is calm and cooperative.  Pt states that he wants help.  Pt in secured room, in paper scrubs, sitter at bedside.  nad noted

## 2011-07-25 NOTE — ED Notes (Signed)
Left in c/o [employer] for transport; calm, cooperative.
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
## 2011-07-25 NOTE — ED Notes (Signed)
Pt c/o back pain and is questioning "is anyone going to do anything about my back? That's why I'm here."; pt reminded that he was brought in by [employer] and told his nurse upon arrival that he felt like he wanted to jump in front of a train; informed that this is the reason he is being worked up from a medical clearance/psychiatric stand point, and the reason for the sitter at his bedside. Pt denies ever saying this and states he "does not need all that.  I'm not going to jump in front of no train." EDPA notified-states pt's back has been examined and that he has Tylenol ordered prn pain.  Medicated per orders. Pt now questioning if he is going to have CT of his back done. EDPA notified of pt questions and asked her to please speak with pt re: plan of care.

## 2011-07-25 NOTE — ED Provider Notes (Addendum)
History     CSN: [id number] Arrival date & time: 07/25/2011  9:31 AM  Chief Complaint  Patient presents with  . Suicidal   HPI Comments: Patient presents with the police [place] and professing he wants to die.  Upon further questioning patient denies suicidal ideation,  States he is here for medicine for his chronic back pain.  He states he drove himself here,  He did not come with the police, and the only person he has spoken to since arrival was his pastor.   It is unclear how long his suicidal ideation has been present given lack of cooperativeness by patient.  Patient is a 45 y.o. male presenting with mental health disorder. The history is provided by the patient and the police. History Limited By: Patient not forthcoming with reason for this visit.  Mental Health Problem Episode onset: Unclear.  Associated symptoms comments: Unknown, except for chronic back pain. He does have a plan to commit suicide. He contemplates harming himself. He has not already injured self. He does not contemplate injuring another person. Risk factors that are present for mental illness include a history of mental illness and substance abuse.    Past Medical History  Diagnosis Date  . Hypertension   . Diabetes mellitus   . Hyperlipidemia     Past Surgical History  Procedure Date  . Rotator cuff repair     No family history on file.  History  Substance Use Topics  . Smoking status: Current Everyday Smoker  . Smokeless tobacco: Not on file  . Alcohol Use: Yes      Review of Systems  Unable to perform ROS   Physical Exam  BP 178/115  Pulse 82  Temp(Src) 98.8 F (37.1 C) (Oral)  Resp 18  Ht 5\' 5"  (1.651 m)  Wt 185 lb (83.915 kg)  BMI 30.79 kg/m2  SpO2 99%  Physical Exam  Nursing note and vitals reviewed. Constitutional: He is oriented to person, place, and time. He appears well-developed and well-nourished.       Hypertension.  Has not taken home  meds today.  HENT:  Head: Normocephalic and atraumatic.  Eyes: Conjunctivae are normal.  Neck: Normal range of motion.  Cardiovascular: Normal rate, regular rhythm and normal heart sounds.   Pulmonary/Chest: Effort normal and breath sounds normal. He has no wheezes.  Abdominal: Soft. Bowel sounds are normal.  Musculoskeletal: Normal range of motion.  Neurological: He is alert and oriented to person, place, and time. No cranial nerve deficit. Coordination normal.  Skin: Skin is warm and dry.  Psychiatric: His speech is normal. His affect is blunt. He expresses inappropriate judgment. He expresses suicidal ideation. He expresses suicidal plans.    ED Course  Procedures  MDM ACT team here to assess and place patient.  He has been involuntarily committed,  As did try to leave shortly after arrival.    Psych hold orders written. Pt w depression, [place]. Has normal mood/affect in ed. Also c/o chronic back pain. No injury. Spine nt.   Medical screening examination/treatment/procedure(s) were conducted as a shared visit with non-physician practitioner(s) and myself.  I personally evaluated the patient during the encounter   Candis Musa, [place] 07/25/11 1215  Suzi Roots, MD 07/25/11 1216  Suzi Roots, MD 07/25/11 [phone]

## 2011-07-25 NOTE — ED Notes (Signed)
Pt agitated, stating he did not know that he needed a sitter to be with him; states he thought he was just coming up here to get help with his back pain; [employer] telephoned d/t pt agitated.

## 2011-08-02 ENCOUNTER — Emergency Department (HOSPITAL_COMMUNITY)
Admission: EM | Admit: 2011-08-02 | Discharge: 2011-08-02 | Discharge status: Against Medical Advice | Payer: Medicaid Other | Attending: Emergency Medicine | Admitting: Emergency Medicine

## 2011-08-02 ENCOUNTER — Encounter (HOSPITAL_COMMUNITY): Payer: Self-pay | Admitting: *Deleted

## 2011-08-02 DIAGNOSIS — Z5321 Procedure and treatment not carried out due to patient leaving prior to being seen by health care provider: Secondary | ICD-10-CM

## 2011-08-02 DIAGNOSIS — Z5320 Procedure and treatment not carried out because of patient's decision for unspecified reasons: Secondary | ICD-10-CM | POA: Insufficient documentation

## 2011-08-02 DIAGNOSIS — M549 Dorsalgia, unspecified: Secondary | ICD-10-CM | POA: Insufficient documentation

## 2011-08-02 NOTE — ED Notes (Signed)
Upon PA entering room to evaluate pt. Pt was not in room. Corky Crafts was on counter. Pt was not in bathroom or lobby. Name called in lobby and no answer. Pt is a LWBS prior to eval by provider.

## 2011-08-02 NOTE — ED Notes (Signed)
Pt c/o lower back pain. Pt states that he has chronic back pain and it got worse this am. Pt denies injury.

## 2011-08-08 NOTE — ED Provider Notes (Signed)
History     CSN: [id number] Arrival date & time: 08/02/2011 11:18 AM  Chief Complaint  Patient presents with  . Back Pain   HPI  Past Medical History  Diagnosis Date  . Hypertension   . Diabetes mellitus   . Hyperlipidemia     Past Surgical History  Procedure Date  . Rotator cuff repair     History reviewed. No pertinent family history.  History  Substance Use Topics  . Smoking status: Current Everyday Smoker  . Smokeless tobacco: Not on file  . Alcohol Use: Yes      Review of Systems  Physical Exam  BP 165/111  Pulse 80  Temp(Src) 98.5 F (36.9 C) (Oral)  Resp 20  Ht 5\' 5"  (1.651 m)  Wt 217 lb (98.431 kg)  BMI 36.11 kg/m2  SpO2 100%  Physical Exam  ED Course  Procedures  MDM      Toy Baker, MD 08/08/11 1643

## 2011-09-17 LAB — COMPREHENSIVE METABOLIC PANEL
ALT: 41
AST: 33
Albumin: 3.9
CO2: 26
Calcium: 9.8
GFR calc Af Amer: >60
GFR calc non Af Amer: >60
Sodium: 141

## 2011-09-17 LAB — URINALYSIS, ROUTINE W REFLEX MICROSCOPIC
Bilirubin Urine: NEGATIVE
Glucose, UA: NEGATIVE
Ketones, ur: NEGATIVE
Protein, ur: 100 — AB

## 2011-09-17 LAB — CBC
MCHC: 34.7
RBC: 5.15
WBC: 7.4

## 2011-09-17 LAB — DIFFERENTIAL
Basophils Relative: 1
Eosinophils Relative: 1
Lymphs Abs: 2.1
Monocytes Absolute: 0.6
Neutro Abs: 4.5

## 2011-09-17 LAB — POCT CARDIAC MARKERS
CKMB, poc: 2
Troponin i, poc: <0.05

## 2011-09-17 LAB — RAPID URINE DRUG SCREEN, HOSP PERFORMED
Benzodiazepines: NOT DETECTED
Cocaine: NOT DETECTED
Opiates: NOT DETECTED
Tetrahydrocannabinol: NOT DETECTED

## 2011-10-15 ENCOUNTER — Encounter (HOSPITAL_COMMUNITY): Payer: Self-pay | Admitting: Emergency Medicine

## 2011-10-15 ENCOUNTER — Emergency Department (HOSPITAL_COMMUNITY)
Admission: EM | Admit: 2011-10-15 | Discharge: 2011-10-15 | Disposition: A | Discharge status: Home / Self Care | Payer: Medicaid Other | Attending: Emergency Medicine | Admitting: Emergency Medicine

## 2011-10-15 ENCOUNTER — Emergency Department (HOSPITAL_COMMUNITY): Payer: Medicaid Other

## 2011-10-15 DIAGNOSIS — E119 Type 2 diabetes mellitus without complications: Secondary | ICD-10-CM | POA: Insufficient documentation

## 2011-10-15 DIAGNOSIS — I10 Essential (primary) hypertension: Secondary | ICD-10-CM | POA: Insufficient documentation

## 2011-10-15 DIAGNOSIS — F3289 Other specified depressive episodes: Secondary | ICD-10-CM | POA: Insufficient documentation

## 2011-10-15 DIAGNOSIS — F17200 Nicotine dependence, unspecified, uncomplicated: Secondary | ICD-10-CM | POA: Insufficient documentation

## 2011-10-15 DIAGNOSIS — W268XXA Contact with other sharp object(s), not elsewhere classified, initial encounter: Secondary | ICD-10-CM | POA: Insufficient documentation

## 2011-10-15 DIAGNOSIS — Y92009 Unspecified place in unspecified non-institutional (private) residence as the place of occurrence of the external cause: Secondary | ICD-10-CM | POA: Insufficient documentation

## 2011-10-15 DIAGNOSIS — Z23 Encounter for immunization: Secondary | ICD-10-CM | POA: Insufficient documentation

## 2011-10-15 DIAGNOSIS — S81009A Unspecified open wound, unspecified knee, initial encounter: Secondary | ICD-10-CM | POA: Insufficient documentation

## 2011-10-15 DIAGNOSIS — IMO0002 Reserved for concepts with insufficient information to code with codable children: Secondary | ICD-10-CM | POA: Insufficient documentation

## 2011-10-15 DIAGNOSIS — F329 Major depressive disorder, single episode, unspecified: Secondary | ICD-10-CM | POA: Insufficient documentation

## 2011-10-15 DIAGNOSIS — E785 Hyperlipidemia, unspecified: Secondary | ICD-10-CM | POA: Insufficient documentation

## 2011-10-15 HISTORY — DX: Major depressive disorder, single episode, unspecified: F32.9

## 2011-10-15 HISTORY — DX: Depression, unspecified: F32.A

## 2011-10-15 MED ORDER — HYDROCODONE-ACETAMINOPHEN 5-325 MG PO TABS: 1.0000 | ORAL_TABLET | ORAL | Status: AC | PRN

## 2011-10-15 MED ORDER — HYDROCODONE-ACETAMINOPHEN 5-325 MG PO TABS
1.0000 | ORAL_TABLET | Freq: Once | ORAL | Status: AC
  Administered 2011-10-15: 1 via ORAL
  Filled 2011-10-15: qty 1

## 2011-10-15 MED ORDER — TETANUS-DIPHTH-ACELL PERTUSSIS 5-2.5-18.5 LF-MCG/0.5 IM SUSP
0.5000 mL | Freq: Once | INTRAMUSCULAR | Status: AC
  Administered 2011-10-15: 0.5 mL via INTRAMUSCULAR
  Filled 2011-10-15: qty 0.5

## 2011-10-15 MED ORDER — CEPHALEXIN 500 MG PO CAPS: 500.0000 mg | ORAL_CAPSULE | Freq: Four times a day (QID) | ORAL | Status: AC

## 2011-10-15 NOTE — ED Notes (Signed)
Patient c/o laceration to left lower leg. Per patient cut it yesterday with a old machete. Patient reports applying neosporin and bandage last night. Area scabbed over, no active bleeding noted. Slight swelling noted at site. Patient unsure of tetanus shot status.

## 2011-10-17 NOTE — ED Provider Notes (Signed)
History     CSN: [id number] Arrival date & time: 10/15/2011  2:46 PM   First MD Initiated Contact with Patient 10/15/11 1555      Chief Complaint  Patient presents with  . Extremity Laceration    (Consider location/radiation/quality/duration/timing/severity/associated sxs/prior treatment) Patient is a 45 y.o. male presenting with leg pain. The history is provided by the patient.  Leg Pain  The incident occurred yesterday. The incident occurred at home. The injury mechanism was a direct blow (Patient injured his left lower leg by hitting it with a hatchet while chopping wood.). The pain is present in the left leg. The quality of the pain is described as sharp. The pain is at a severity of 10/10. The pain is severe. The pain has been constant since onset. Pertinent negatives include no numbness and no inability to bear weight. He reports no foreign bodies present. He has tried nothing for the symptoms.    Past Medical History  Diagnosis Date  . Hypertension   . Diabetes mellitus   . Hyperlipidemia   . Depression     Past Surgical History  Procedure Date  . Rotator cuff repair     Family History  Problem Relation Age of Onset  . Diabetes Mother   . Hypertension Mother   . Diabetes Father   . Hypertension Father   . Diabetes Brother   . Hypertension Brother     History  Substance Use Topics  . Smoking status: Current Everyday Smoker -- 0.5 packs/day for 30 years    Types: Cigarettes  . Smokeless tobacco: Never Used  . Alcohol Use: 4.2 oz/week    7 Cans of beer per week      Review of Systems  Constitutional: Negative for fever.  HENT: Negative for sore throat and neck pain.   Eyes: Negative.   Respiratory: Negative for chest tightness and shortness of breath.   Cardiovascular: Negative for chest pain.  Gastrointestinal: Negative for abdominal pain.  Genitourinary: Negative.   Musculoskeletal: Positive for arthralgias. Negative for joint swelling.  Skin:  Positive for wound. Negative for rash.  Neurological: Negative for dizziness, weakness, light-headedness, numbness and headaches.  Hematological: Negative.   Psychiatric/Behavioral: Negative.     Allergies  Oxcarbazepine  Home Medications   Current Outpatient Rx  Name Route Sig Dispense Refill  . AMLODIPINE BESYLATE 10 MG PO TABS Oral Take 10 mg by mouth daily.     Marland Kitchen BENAZEPRIL HCL 40 MG PO TABS Oral Take 40 mg by mouth 2 (two) times daily.      Marland Kitchen FLUOXETINE HCL 20 MG PO CAPS Oral Take 20 mg by mouth daily.      Marland Kitchen PERPHENAZINE 4 MG PO TABS Oral Take 4 mg by mouth at bedtime.      Marland Kitchen SIMVASTATIN 20 MG PO TABS Oral Take 20 mg by mouth at bedtime.      . CEPHALEXIN 500 MG PO CAPS Oral Take 1 capsule (500 mg total) by mouth 4 (four) times daily. 28 capsule 0  . HYDROCODONE-ACETAMINOPHEN 5-325 MG PO TABS Oral Take 1 tablet by mouth every 4 (four) hours as needed for pain. 30 tablet 0    BP 187/112  Pulse 112  Temp(Src) 98.5 F (36.9 C) (Oral)  Resp 20  Ht 5\' 5"  (1.651 m)  Wt 220 lb (99.791 kg)  BMI 36.61 kg/m2  SpO2 100%  Physical Exam  Nursing note and vitals reviewed. Constitutional: He is oriented to person, place, and time. He appears well-developed  and well-nourished.  HENT:  Head: Normocephalic and atraumatic.  Eyes: Conjunctivae are normal.  Neck: Normal range of motion.  Cardiovascular: Normal rate and intact distal pulses.   Pulmonary/Chest: Effort normal and breath sounds normal.  Abdominal: Soft. He exhibits no distension.  Musculoskeletal: Normal range of motion.       3 cm hematoma mid anterior left tibia with 1 cm laceration,  Approximated and hemostatic.  Neurological: He is alert and oriented to person, place, and time.  Skin: Skin is warm and dry. Laceration noted.       Per musculoskeletal  comment  Psychiatric: He has a normal mood and affect.    ED Course  Procedures (including critical care time)  Laceration is 24 hours old,  Well approximated and  hemostatic,  No stitches or other wound repair indicated.    1. Laceration       MDM  Tetanus updated.  Patient also placed on Keflex to decrease risk for infection.        Candis Musa, PA 10/17/11 2216

## 2011-10-18 NOTE — ED Provider Notes (Signed)
Medical screening examination/treatment/procedure(s) were performed by non-physician practitioner and as supervising physician I was immediately available for consultation/collaboration.   Joya Gaskins, MD 10/18/11 1214

## 2012-04-15 ENCOUNTER — Emergency Department (HOSPITAL_COMMUNITY): Payer: Medicaid Other

## 2012-04-15 ENCOUNTER — Other Ambulatory Visit: Payer: Self-pay

## 2012-04-15 ENCOUNTER — Emergency Department (HOSPITAL_COMMUNITY)
Admission: EM | Admit: 2012-04-15 | Discharge: 2012-04-16 | Disposition: A | Discharge status: Home / Self Care | Payer: Medicaid Other | Attending: Emergency Medicine | Admitting: Emergency Medicine

## 2012-04-15 ENCOUNTER — Encounter (HOSPITAL_COMMUNITY): Payer: Self-pay

## 2012-04-15 DIAGNOSIS — I10 Essential (primary) hypertension: Secondary | ICD-10-CM | POA: Insufficient documentation

## 2012-04-15 DIAGNOSIS — I498 Other specified cardiac arrhythmias: Secondary | ICD-10-CM | POA: Insufficient documentation

## 2012-04-15 DIAGNOSIS — E785 Hyperlipidemia, unspecified: Secondary | ICD-10-CM | POA: Insufficient documentation

## 2012-04-15 DIAGNOSIS — K2990 Gastroduodenitis, unspecified, without bleeding: Secondary | ICD-10-CM | POA: Insufficient documentation

## 2012-04-15 DIAGNOSIS — R079 Chest pain, unspecified: Secondary | ICD-10-CM | POA: Insufficient documentation

## 2012-04-15 DIAGNOSIS — F329 Major depressive disorder, single episode, unspecified: Secondary | ICD-10-CM | POA: Insufficient documentation

## 2012-04-15 DIAGNOSIS — F3289 Other specified depressive episodes: Secondary | ICD-10-CM | POA: Insufficient documentation

## 2012-04-15 DIAGNOSIS — R0602 Shortness of breath: Secondary | ICD-10-CM | POA: Insufficient documentation

## 2012-04-15 DIAGNOSIS — K2970 Gastritis, unspecified, without bleeding: Secondary | ICD-10-CM | POA: Insufficient documentation

## 2012-04-15 LAB — CBC
MCH: 33.5 pg (ref 26.0–34.0)
MCV: 95.5 fL (ref 78.0–100.0)
Platelets: 196 10*3/uL (ref 150–400)
RBC: 4.45 MIL/uL (ref 4.22–5.81)
RDW: 14.3 % (ref 11.5–15.5)
WBC: 6.9 10*3/uL (ref 4.0–10.5)

## 2012-04-15 LAB — POCT I-STAT TROPONIN I: Troponin i, poc: 0.05 ng/mL (ref 0.00–0.08)

## 2012-04-15 LAB — BASIC METABOLIC PANEL
CO2: 24 mEq/L (ref 19–32)
Calcium: 9.7 mg/dL (ref 8.4–10.5)
Chloride: 100 mEq/L (ref 96–112)
Creatinine, Ser: 1.43 mg/dL — ABNORMAL HIGH (ref 0.50–1.35)
GFR calc Af Amer: 68 mL/min — ABNORMAL LOW (ref >90)
Sodium: 138 mEq/L (ref 135–145)

## 2012-04-15 MED ORDER — ASPIRIN 81 MG PO CHEW
324.0000 mg | CHEWABLE_TABLET | Freq: Once | ORAL | Status: AC
  Administered 2012-04-15: 324 mg via ORAL
  Filled 2012-04-15: qty 4

## 2012-04-15 NOTE — ED Notes (Signed)
Pt states his chest pain is better at this time.

## 2012-04-15 NOTE — ED Notes (Signed)
Hurting in chest, short of breath, burning sensation, started after eating per pt. This is the 4th day that it has happened. Tonight it has gotten worse per pt.

## 2012-04-15 NOTE — ED Notes (Signed)
Pt reports burning in his chest after eating. States spicy food & alcohol makes it worse. Pt states chest burning, back pain & headache.

## 2012-04-15 NOTE — ED Notes (Signed)
Was advised by pharmacy tech that pt states he has been taking 1600mg  ibuprofen 3 times a day for pain. PA idol notified.

## 2012-04-16 MED ORDER — PANTOPRAZOLE SODIUM 40 MG PO TBEC: 40.0000 mg | DELAYED_RELEASE_TABLET | Freq: Every day | ORAL | Status: DC

## 2012-04-16 NOTE — ED Notes (Signed)
Pt alert & oriented x4, stable gait. Pt given discharge instructions, paperwork & prescription(s). Patient instructed to stop at the registration desk to finish any additional paperwork. pt verbalized understanding. Pt left department w/ no further questions.  

## 2012-04-16 NOTE — ED Notes (Signed)
PA states going to draw a 0200 troponin.

## 2012-04-16 NOTE — Progress Notes (Signed)
0115 Assumed care/dispostion of patient who presented with chest discomfort after eating. First troponin 0.95. EKG is normal for the patient. Awaiting second troponin. 0250 Second troponin 0.09.  Patient has remained pain free since arrival in the ER. With recent history of ingestion of regular ibuprofen, agree with probable cause being GI. D/C ibuprofen. Rx for protonix written by mid level.Patient is ready for discharge.

## 2012-04-16 NOTE — ED Provider Notes (Signed)
History     CSN: [id number]  Arrival date & time 04/15/12  2049   First MD Initiated Contact with Patient 04/15/12 2112      Chief Complaint  Patient presents with  . Shortness of Breath  . Chest Pain  . Headache    (Consider location/radiation/quality/duration/timing/severity/associated sxs/prior treatment) HPI Comments: Taylor Obrien presents for evaluation of chest pain which he describes as squeezing sensation that lasted for about an hour after he ate dinner this evening around 8 PM.  He reports the pain was constant and resolved spontaneously prior to arrival.   He denies diaphoresis, lightheadedness and palpitations during this event.    He has taken no medications including no antacids or aspirin prior to arrival.  He does describe having a moderate headache at the time of the chest pain which is also resolved.   He also reports chronic shortness of breath with exertion but does notice the past 4 days he has had increased shortness of breath, describing that while he was fishing today he had to rest several times as [place].  He did not have any chest pain during the shortness of breath episodes.  Positive risk factors include hypertension hyperlipidemia and tobacco abuse.  Patient is a 46 y.o. male presenting with chest pain. The history is provided by the patient.  Chest Pain The chest pain is resolved. The pain is associated with eating. At its most intense, the pain is at 9/10. The pain is currently at 0/10. The quality of the pain is described as pressure-like, burning and squeezing. The pain does not radiate. Primary symptoms include shortness of breath. Pertinent negatives for primary symptoms include no fever, no wheezing, no palpitations, no abdominal pain, no nausea and no dizziness.  Pertinent negatives for associated symptoms include no numbness and no weakness. He tried nothing for the symptoms.  His past medical history is  significant for diabetes, hyperlipidemia and hypertension.     Past Medical History  Diagnosis Date  . Hypertension   . Hyperlipidemia   . Depression     Past Surgical History  Procedure Date  . Rotator cuff repair     Family History  Problem Relation Age of Onset  . Diabetes Mother   . Hypertension Mother   . Diabetes Father   . Hypertension Father   . Diabetes Brother   . Hypertension Brother     History  Substance Use Topics  . Smoking status: Current Everyday Smoker -- 0.5 packs/day for 30 years    Types: Cigarettes  . Smokeless tobacco: Never Used  . Alcohol Use: No      Review of Systems  Constitutional: Negative for fever.  HENT: Negative for congestion, sore throat and neck pain.   Eyes: Negative.   Respiratory: Positive for chest tightness and shortness of breath. Negative for wheezing.   Cardiovascular: Negative for chest pain and palpitations.  Gastrointestinal: Negative for nausea and abdominal pain.  Genitourinary: Negative.   Musculoskeletal: Negative for joint swelling and arthralgias.  Skin: Negative.  Negative for rash and wound.  Neurological: Negative for dizziness, weakness, light-headedness, numbness and headaches.  Hematological: Negative.   Psychiatric/Behavioral: Negative.     Allergies  Oxcarbazepine  Home Medications   Current Outpatient Rx  Name Route Sig Dispense Refill  . AMLODIPINE BESYLATE 10 MG PO TABS Oral Take 10 mg by mouth daily.     . ABILIFY PO Oral Take 1 tablet by  mouth every evening.    Marland Kitchen BENAZEPRIL HCL 40 MG PO TABS Oral Take 40 mg by mouth 2 (two) times daily.      Marland Kitchen FLUOXETINE HCL 20 MG PO CAPS Oral Take 20 mg by mouth daily.      Marland Kitchen HYDROCODONE-APAP-DIETARY PROD PO Oral Take 1 tablet by mouth daily as needed. For pain    . IBUPROFEN 800 MG PO TABS Oral Take 1,600 mg by mouth 3 (three) times daily as needed. For pain    . SIMVASTATIN 20 MG PO TABS Oral Take 20 mg by mouth at bedtime.      Marland Kitchen PANTOPRAZOLE SODIUM  40 MG PO TBEC Oral Take 1 tablet (40 mg total) by mouth daily. 30 tablet 0    BP 170/111  Pulse 97  Temp(Src) 98.5 F (36.9 C) (Oral)  Resp 22  Wt 230 lb (104.327 kg)  SpO2 100%  Physical Exam  Nursing note and vitals reviewed. Constitutional: He appears well-developed and well-nourished.  HENT:  Head: Normocephalic and atraumatic.  Eyes: Conjunctivae are normal.  Neck: Normal range of motion.  Cardiovascular: Normal rate, regular rhythm, normal heart sounds and intact distal pulses.   No murmur heard. Pulmonary/Chest: Effort normal and breath sounds normal. No respiratory distress. He has no wheezes. He has no rales.  Abdominal: Soft. Bowel sounds are normal. There is no tenderness.  Musculoskeletal: Normal range of motion. He exhibits no edema.  Neurological: He is alert.  Skin: Skin is warm and dry.  Psychiatric: He has a normal mood and affect.    ED Course  Procedures (including critical care time)  Results for orders placed during the hospital encounter of 04/15/12  CBC      Component Value Range   WBC 6.9  4.0 - 10.5 (K/uL)   RBC 4.45  4.22 - 5.81 (MIL/uL)   Hemoglobin 14.9  13.0 - 17.0 (g/dL)   HCT 16.1  09.6 - 04.5 (%)   MCV 95.5  78.0 - 100.0 (fL)   MCH 33.5  26.0 - 34.0 (pg)   MCHC 35.1  30.0 - 36.0 (g/dL)   RDW 40.9  81.1 - 91.4 (%)   Platelets 196  150 - 400 (K/uL)  BASIC METABOLIC PANEL      Component Value Range   Sodium 138  135 - 145 (mEq/L)   Potassium 3.6  3.5 - 5.1 (mEq/L)   Chloride 100  96 - 112 (mEq/L)   CO2 24  19 - 32 (mEq/L)   Glucose, Bld 127 (*) 70 - 99 (mg/dL)   BUN 10  6 - 23 (mg/dL)   Creatinine, Ser 7.82 (*) 0.50 - 1.35 (mg/dL)   Calcium 9.7  8.4 - 95.6 (mg/dL)   GFR calc non Af Amer 59 (*) >90 (mL/min)   GFR calc Af Amer 68 (*) >90 (mL/min)  POCT I-STAT TROPONIN I      Component Value Range   Troponin i, poc 0.05  0.00 - 0.08 (ng/mL)   Comment 3           D-DIMER, QUANTITATIVE      Component Value Range   D-Dimer, Quant  <0.22  0.00 - 0.48 (ug/mL-FEU)   Dg Chest Portable 1 View  04/15/2012  *RADIOLOGY REPORT*  Clinical Data: Shortness of breath and chest pain.  PORTABLE CHEST - 1 VIEW  Comparison: 04/01/2011  Findings: 2149 hours.  Lung volumes are low. The cardiopericardial silhouette is enlarged.  Vascular congestion noted.  There is bibasilar atelectasis.  No  dense airspace consolidation.  No pleural effusion is visible. Telemetry leads overlie the chest.  IMPRESSION: Low volume film with cardiomegaly and vascular congestion.  Basilar atelectasis.  Original Report Authenticated By: ERIC A. MANSELL, M.D.     1. Gastritis   2. Hypertension      Date: 04/15/2012  Rate: 101  Rhythm: sinus tachycardia  QRS Axis: normal  Intervals: QT prolonged  ST/T Wave abnormalities: ST depressions laterally and early repolarization  Conduction Disutrbances:none  Narrative Interpretation:   Old EKG Reviewed: unchanged from 04/01/2011      MDM  1:29 AM Patient has remained pain free since arrival in ed.  It has come to light that patient has been taking ibuprofen 1600 mg tid prn (low back pain).  And has been taking this elevated amount this past week.  Suspect gastritis as source of chest pain,  Particularly with pain worsening after eating a meal tonight.  However,  Given significant risk factors for cad/angina,  Will obtain second 6 hour cardiac troponin prior to disposition of patient.  If second marker is negative,  Pt should dc his ibuprofen and be place on protonix.    Patient discussed with Dr Colon Branch who will dispo pt.         Burgess Amor, PA 04/16/12 1610  Nicoletta Dress. Colon Branch, MD 04/16/12 [phone]

## 2012-04-16 NOTE — Discharge Instructions (Signed)
Gastritis Gastritis is an inflammation (the body's way of reacting to injury and/or infection) of the stomach. It is often caused by viral or bacterial (germ) infections. It can also be caused by chemicals (including alcohol) and medications. This illness may be associated with generalized malaise (feeling tired, not well), cramps, and fever. The illness may last 2 to 7 days. If symptoms of gastritis continue, gastroscopy (looking into the stomach with a telescope-like instrument), biopsy (taking tissue samples), and/or blood tests may be necessary to determine the cause. Antibiotics will not affect the illness unless there is a bacterial infection present. One common bacterial cause of gastritis is an organism known as H. Pylori. This can be treated with antibiotics. Other forms of gastritis are caused by too much acid in the stomach. They can be treated with medications such as H2 blockers and antacids. Home treatment is usually all that is needed. Young children will quickly become dehydrated (loss of body fluids) if vomiting and diarrhea are both present. Medications may be given to control nausea. Medications are usually not given for diarrhea unless especially bothersome. Some medications slow the removal of the virus from the gastrointestinal tract. This slows down the healing process. HOME CARE INSTRUCTIONS Home care instructions for nausea and vomiting:  For adults: drink small amounts of fluids often. Drink at least 2 quarts a day. Take sips frequently. Do not drink large amounts of fluid at one time. This may worsen the nausea.   Only take over-the-counter or prescription medicines for pain, discomfort, or fever as directed by your caregiver.   Drink clear liquids only. Those are anything you can see through such as water, broth, or soft drinks.   Once you are keeping clear liquids down, you may start full liquids, soups, juices, and ice cream or sherbet. Slowly add bland (plain, not spicy)  foods to your diet.  Home care instructions for diarrhea:  Diarrhea can be caused by bacterial infections or a virus. Your condition should improve with time, rest, fluids, and/or anti-diarrheal medication.   Until your diarrhea is under control, you should drink clear liquids often in small amounts. Clear liquids include: water, broth, jell-o water and weak tea.  Avoid:  Milk.   Fruits.   Tobacco.   Alcohol.   Extremely hot or cold fluids.   Too much intake of anything at one time.  When your diarrhea stops you may add the following foods, which help the stool to become more formed:  Rice.   Bananas.   Apples without skin.   Dry toast.  Once these foods are tolerated you may add low-fat yogurt and low-fat cottage cheese. They will help to restore the normal bacterial balance in your bowel. Wash your hands well to avoid spreading bacteria (germ) or virus. SEEK IMMEDIATE MEDICAL CARE IF:   You are unable to keep fluids down.   Vomiting or diarrhea become persistent (constant).   Abdominal pain develops, increases, or localizes. (Right sided pain can be appendicitis. Left sided pain in adults can be diverticulitis.)   You develop a fever (an oral temperature above 102 F (38.9 C)).   Diarrhea becomes excessive or contains blood or mucus.   You have excessive weakness, dizziness, fainting or extreme thirst.   You are not improving or you are getting worse.   You have any other questions or concerns.  Document Released: 11/19/2001 Document Revised: 11/14/2011 Document Reviewed: 11/25/2005 [facility] Patient Information 2012 [place], [place].Hypertension Information As your heart beats, it forces blood through  your arteries. This force is your blood pressure. If the pressure is too high, it is called hypertension (HTN) or high blood pressure. HTN is dangerous because you may have it and not know it. High blood pressure may mean that your heart has to work harder to pump  blood. Your arteries may be narrow or stiff. The extra work puts you at risk for heart disease, stroke, and other problems.  Blood pressure consists of two numbers, a higher number over a lower, 110/72, for example. It is stated as "110 over 72." The ideal is below 120 for the top number (systolic) and under 80 for the bottom (diastolic).  You should pay close attention to your blood pressure if you have certain conditions such as:  Heart failure.   Prior heart attack.   Diabetes   Chronic kidney disease.   Prior stroke.   Multiple risk factors for heart disease.  To see if you have HTN, your blood pressure should be measured while you are seated with your arm held at the level of the heart. It should be measured at least twice. A one-time elevated blood pressure reading (especially in the Emergency Department) does not mean that you need treatment. There may be conditions in which the blood pressure is different between your right and left arms. It is important to see your caregiver soon for a recheck. Most people have essential hypertension which means that there is not a specific cause. This type of high blood pressure may be lowered by changing lifestyle factors such as:  Stress.   Smoking.   Lack of exercise.   Excessive weight.   Drug/tobacco/alcohol use.   Eating less salt.  Most people do not have symptoms from high blood pressure until it has caused damage to the body. Effective treatment can often prevent, delay or reduce that damage. TREATMENT  Treatment for high blood pressure, when a cause has been identified, is directed at the cause. There are a large number of medications to treat HTN. These fall into several categories, and your caregiver will help you select the medicines that are best for you. Medications may have side effects. You should review side effects with your caregiver. If your blood pressure stays high after you have made lifestyle changes or started on  medicines,   Your medication(s) may need to be changed.   Other problems may need to be addressed.   Be certain you understand your prescriptions, and know how and when to take your medicine.   Be sure to follow up with your caregiver within the time frame advised (usually within two weeks) to have your blood pressure rechecked and to review your medications.   If you are taking more than one medicine to lower your blood pressure, make sure you know how and at what times they should be taken. Taking two medicines at the same time can result in blood pressure that is too low.  Document Released: 01/28/2006 Document Revised: 08/07/2011 Document Reviewed: 02/04/2008 [facility] Patient Information 2012 [place], [place].   You are taking a dangerous amount of ibuprofen  Which can be causing you to have your gastritis pain and also can be the source of your elevated blood pressure.  This medication can also cause you to have kidney problems.  Stop taking this medication and start using the protonix prescribed to help protect and heal your stomach from the irritation I suspect the ibuprofen has caused.  Call your doctor for a recheck of your symptoms  within the next week.  Your lab tests tonight do not show any sign of a cardiac source for your symptoms tonight.

## 2012-04-30 ENCOUNTER — Encounter: Payer: Self-pay | Admitting: Cardiology

## 2012-04-30 ENCOUNTER — Encounter (INDEPENDENT_AMBULATORY_CARE_PROVIDER_SITE_OTHER): Payer: Medicaid Other | Admitting: Cardiology

## 2012-05-01 ENCOUNTER — Encounter: Payer: Self-pay | Admitting: Cardiology

## 2012-05-01 ENCOUNTER — Ambulatory Visit: Payer: Medicaid Other | Admitting: Cardiology

## 2012-05-02 NOTE — Progress Notes (Signed)
This encounter was created in error - please disregard.

## 2012-07-02 ENCOUNTER — Emergency Department (HOSPITAL_COMMUNITY)
Admission: EM | Admit: 2012-07-02 | Discharge: 2012-07-02 | Disposition: A | Discharge status: Home / Self Care | Payer: Medicaid Other | Attending: Emergency Medicine | Admitting: Emergency Medicine

## 2012-07-02 ENCOUNTER — Emergency Department (HOSPITAL_COMMUNITY): Payer: Medicaid Other

## 2012-07-02 ENCOUNTER — Encounter (HOSPITAL_COMMUNITY): Payer: Self-pay | Admitting: Emergency Medicine

## 2012-07-02 DIAGNOSIS — K219 Gastro-esophageal reflux disease without esophagitis: Secondary | ICD-10-CM | POA: Insufficient documentation

## 2012-07-02 DIAGNOSIS — F17200 Nicotine dependence, unspecified, uncomplicated: Secondary | ICD-10-CM | POA: Insufficient documentation

## 2012-07-02 DIAGNOSIS — I10 Essential (primary) hypertension: Secondary | ICD-10-CM | POA: Insufficient documentation

## 2012-07-02 DIAGNOSIS — M5416 Radiculopathy, lumbar region: Secondary | ICD-10-CM

## 2012-07-02 DIAGNOSIS — G8929 Other chronic pain: Secondary | ICD-10-CM | POA: Insufficient documentation

## 2012-07-02 DIAGNOSIS — IMO0002 Reserved for concepts with insufficient information to code with codable children: Secondary | ICD-10-CM | POA: Insufficient documentation

## 2012-07-02 DIAGNOSIS — M545 Low back pain: Secondary | ICD-10-CM

## 2012-07-02 DIAGNOSIS — E785 Hyperlipidemia, unspecified: Secondary | ICD-10-CM | POA: Insufficient documentation

## 2012-07-02 MED ORDER — PREDNISONE 20 MG PO TABS
60.0000 mg | ORAL_TABLET | Freq: Once | ORAL | Status: AC
  Administered 2012-07-02: 60 mg via ORAL
  Filled 2012-07-02: qty 3

## 2012-07-02 MED ORDER — DIAZEPAM 5 MG PO TABS
10.0000 mg | ORAL_TABLET | Freq: Once | ORAL | Status: AC
  Administered 2012-07-02: 10 mg via ORAL
  Filled 2012-07-02: qty 2

## 2012-07-02 MED ORDER — HYDROMORPHONE HCL PF 1 MG/ML IJ SOLN
1.0000 mg | Freq: Once | INTRAMUSCULAR | Status: AC
  Administered 2012-07-02: 1 mg via INTRAMUSCULAR
  Filled 2012-07-02: qty 1

## 2012-07-02 MED ORDER — HYDROCODONE-ACETAMINOPHEN 5-325 MG PO TABS: 1.0000 | ORAL_TABLET | Freq: Four times a day (QID) | ORAL | Status: DC | PRN

## 2012-07-02 MED ORDER — DIAZEPAM 5 MG PO TABS: 10.0000 mg | ORAL_TABLET | Freq: Four times a day (QID) | ORAL | Status: AC | PRN

## 2012-07-02 MED ORDER — ONDANSETRON 4 MG PO TBDP
4.0000 mg | ORAL_TABLET | Freq: Once | ORAL | Status: AC
  Administered 2012-07-02: 4 mg via ORAL
  Filled 2012-07-02: qty 1

## 2012-07-02 MED ORDER — PREDNISONE 50 MG PO TABS: ORAL_TABLET | ORAL | Status: AC

## 2012-07-02 NOTE — ED Notes (Signed)
Pt returned from xray

## 2012-07-02 NOTE — ED Notes (Signed)
Pt given phone to call friend to pick him up. Pt talking on phone at this time.

## 2012-07-02 NOTE — ED Notes (Signed)
Having back pain for three days. History of pain. Was getting cortisone shots but quit

## 2012-07-02 NOTE — ED Notes (Signed)
Pt now states that he help lift someone out of a wheelchair and since has had worsening pain.

## 2012-07-02 NOTE — ED Provider Notes (Signed)
History     CSN: [id number]  Arrival date & time 07/02/12  1601   First MD Initiated Contact with Patient 07/02/12 0818      Chief Complaint  Patient presents with  . Back Pain    (Consider location/radiation/quality/duration/timing/severity/associated sxs/prior treatment) HPI Comments: Has a  Several year history of low back pain but worse in past 3 days and radiation to R foot new in same time period.  No incontinence.  No saddle dysthesia.  Worse with movement.  PCP is dr. Janna Arch.  Also states he's seen "dr. Freida Busman, a spine specialist" who has administered cortisone injections into spine with no relief.  "pins and needles" sensation to  R foot.  Patient is a 46 y.o. male presenting with back pain. The history is provided by the patient. No language interpreter was used.  Back Pain  This is a chronic problem. The problem occurs constantly. The problem has not changed since onset.Associated with: does not recall any specific event  but thinks he may have caused a flare up  "playing with my kids". The pain is present in the lumbar spine. The pain is severe. The symptoms are aggravated by bending and twisting. The pain is the same all the time. Associated symptoms include leg pain and paresthesias. Pertinent negatives include no fever, no bowel incontinence, no perianal numbness, no bladder incontinence, no dysuria, no paresis, no tingling and no weakness. He has tried nothing for the symptoms. Risk factors include obesity.    Past Medical History  Diagnosis Date  . Essential hypertension, benign   . Hyperlipidemia   . Depression   . Chronic pain   . GERD (gastroesophageal reflux disease)     Past Surgical History  Procedure Date  . Right rotator cuff repair   . Closed reduction mandibular fracture w/ arch bars     + multiple extractions  . Removal foreign body right shoulder   . Condyloma resection     Family History  Problem Relation Age of Onset  . Diabetes Mother   .  Hypertension Mother   . Diabetes Father   . Hypertension Father   . Diabetes Brother   . Hypertension Brother     History  Substance Use Topics  . Smoking status: Current Everyday Smoker -- 0.5 packs/day for 30 years    Types: Cigarettes  . Smokeless tobacco: Never Used  . Alcohol Use: No      Review of Systems  Constitutional: Negative for fever and chills.  Gastrointestinal: Negative for bowel incontinence.  Genitourinary: Negative for bladder incontinence, dysuria, urgency, frequency, hematuria and penile pain.  Musculoskeletal: Positive for back pain.  Neurological: Positive for paresthesias. Negative for tingling and weakness.  All other systems reviewed and are negative.    Allergies  Oxcarbazepine  Home Medications   Current Outpatient Rx  Name Route Sig Dispense Refill  . AMLODIPINE BESYLATE 10 MG PO TABS Oral Take 10 mg by mouth daily.     Marland Kitchen BENAZEPRIL HCL 40 MG PO TABS Oral Take 40 mg by mouth 2 (two) times daily.      Marland Kitchen FLUOXETINE HCL 20 MG PO CAPS Oral Take 20 mg by mouth daily.      Marland Kitchen HYDROCODONE-ACETAMINOPHEN 7.5-325 MG PO TABS Oral Take 1 tablet by mouth every 6 (six) hours as needed. Pain    . IBUPROFEN 800 MG PO TABS Oral Take 1,600 mg by mouth 3 (three) times daily as needed. For pain    . SIMVASTATIN 20 MG  PO TABS Oral Take 20 mg by mouth at bedtime.      Marland Kitchen DIAZEPAM 5 MG PO TABS Oral Take 2 tablets (10 mg total) by mouth every 6 (six) hours as needed for anxiety. 20 tablet 0  . HYDROCODONE-ACETAMINOPHEN 5-325 MG PO TABS Oral Take 1 tablet by mouth every 6 (six) hours as needed for pain. 20 tablet 0  . PREDNISONE 50 MG PO TABS  One tab po QD 5 tablet 0    BP 189/118  Pulse 99  Temp 98.4 F (36.9 C) (Oral)  Resp 20  Ht 5\' 9"  (1.753 m)  Wt 225 lb (102.059 kg)  BMI 33.23 kg/m2  SpO2 98%  Physical Exam  Nursing note and vitals reviewed. Constitutional: He is oriented to person, place, and time. He appears well-developed and well-nourished.    HENT:  Head: Normocephalic and atraumatic.  Eyes: EOM are normal.  Neck: Normal range of motion.  Cardiovascular: Normal rate, regular rhythm, normal heart sounds and intact distal pulses.   Pulmonary/Chest: Effort normal and breath sounds normal. No respiratory distress.  Abdominal: Soft. He exhibits no distension. There is no tenderness.  Musculoskeletal:       Lumbar back: He exhibits decreased range of motion. He exhibits no tenderness, no bony tenderness, no swelling, no deformity, no spasm and normal pulse.       Back:  Neurological: He is alert and oriented to person, place, and time. He has normal strength. He displays normal reflexes. No sensory deficit. Coordination and gait normal.  Reflex Scores:      Patellar reflexes are 3+ on the right side and 3+ on the left side.      Achilles reflexes are 3+ on the right side and 3+ on the left side. Skin: Skin is warm and dry.  Psychiatric: He has a normal mood and affect. Judgment normal.    ED Course  Procedures (including critical care time)  Labs Reviewed - No data to display Dg Lumbar Spine Complete  07/02/2012  *RADIOLOGY REPORT*  Clinical Data: Low back pain radiating down right leg, injured lifting  LUMBAR SPINE - COMPLETE 4+ VIEW  Comparison: 04/04/2011  Findings: Five non-rib bearing lumbar vertebrae. Vertebral body and disc space heights maintained. No acute fracture, subluxation or bone destruction. Minimal atherosclerotic calcification. No spondylolysis. SI joints symmetric. Question osseous demineralization.  IMPRESSION: No acute osseous abnormalities.  Original Report Authenticated By: Lollie Marrow, M.D.     1. Acute lumbar back pain   2. Lumbar radiculopathy       MDM  Ice rx-pred 60 mg, 5 rx-hydrocodone, 20 rx-valium 10 mg, 20 F/u with dr. Theotis Burrow, PA 07/02/12 1015

## 2012-07-03 NOTE — ED Provider Notes (Signed)
Medical screening examination/treatment/procedure(s) were performed by non-physician practitioner and as supervising physician I was immediately available for consultation/collaboration.  Donnetta Hutching, MD 07/03/12 [phone]

## 2012-07-11 ENCOUNTER — Inpatient Hospital Stay (HOSPITAL_COMMUNITY)
Admission: EM | Admit: 2012-07-11 | Discharge: 2012-07-12 | DRG: 305 | Discharge status: Against Medical Advice | Payer: Medicaid Other | Attending: Internal Medicine | Admitting: Internal Medicine

## 2012-07-11 ENCOUNTER — Emergency Department (HOSPITAL_COMMUNITY): Payer: Medicaid Other

## 2012-07-11 ENCOUNTER — Encounter (HOSPITAL_COMMUNITY): Payer: Self-pay | Admitting: *Deleted

## 2012-07-11 DIAGNOSIS — K219 Gastro-esophageal reflux disease without esophagitis: Secondary | ICD-10-CM | POA: Diagnosis present

## 2012-07-11 DIAGNOSIS — E785 Hyperlipidemia, unspecified: Secondary | ICD-10-CM | POA: Diagnosis present

## 2012-07-11 DIAGNOSIS — Z833 Family history of diabetes mellitus: Secondary | ICD-10-CM

## 2012-07-11 DIAGNOSIS — M545 Low back pain, unspecified: Secondary | ICD-10-CM | POA: Diagnosis present

## 2012-07-11 DIAGNOSIS — I161 Hypertensive emergency: Secondary | ICD-10-CM

## 2012-07-11 DIAGNOSIS — F17200 Nicotine dependence, unspecified, uncomplicated: Secondary | ICD-10-CM | POA: Diagnosis present

## 2012-07-11 DIAGNOSIS — Z79899 Other long term (current) drug therapy: Secondary | ICD-10-CM

## 2012-07-11 DIAGNOSIS — I10 Essential (primary) hypertension: Principal | ICD-10-CM | POA: Diagnosis present

## 2012-07-11 DIAGNOSIS — Z8249 Family history of ischemic heart disease and other diseases of the circulatory system: Secondary | ICD-10-CM

## 2012-07-11 DIAGNOSIS — G8929 Other chronic pain: Secondary | ICD-10-CM | POA: Diagnosis present

## 2012-07-11 DIAGNOSIS — R079 Chest pain, unspecified: Secondary | ICD-10-CM | POA: Diagnosis present

## 2012-07-11 DIAGNOSIS — F3289 Other specified depressive episodes: Secondary | ICD-10-CM | POA: Diagnosis present

## 2012-07-11 DIAGNOSIS — F329 Major depressive disorder, single episode, unspecified: Secondary | ICD-10-CM | POA: Diagnosis present

## 2012-07-11 DIAGNOSIS — IMO0002 Reserved for concepts with insufficient information to code with codable children: Secondary | ICD-10-CM

## 2012-07-11 LAB — COMPREHENSIVE METABOLIC PANEL
AST: 47 U/L — ABNORMAL HIGH (ref 0–37)
Albumin: 3.8 g/dL (ref 3.5–5.2)
BUN: 14 mg/dL (ref 6–23)
Calcium: 9.7 mg/dL (ref 8.4–10.5)
Chloride: 98 mEq/L (ref 96–112)
Creatinine, Ser: 1.3 mg/dL (ref 0.50–1.35)
Total Bilirubin: 0.3 mg/dL (ref 0.3–1.2)
Total Protein: 7.4 g/dL (ref 6.0–8.3)

## 2012-07-11 LAB — CBC WITH DIFFERENTIAL/PLATELET
Basophils Absolute: 0 10*3/uL (ref 0.0–0.1)
Basophils Relative: 0 % (ref 0–1)
Eosinophils Absolute: 0.1 10*3/uL (ref 0.0–0.7)
Eosinophils Relative: 2 % (ref 0–5)
HCT: 46.4 % (ref 39.0–52.0)
Hemoglobin: 16.1 g/dL (ref 13.0–17.0)
MCH: 33.1 pg (ref 26.0–34.0)
MCHC: 34.7 g/dL (ref 30.0–36.0)
Monocytes Absolute: 0.6 10*3/uL (ref 0.1–1.0)
Monocytes Relative: 7 % (ref 3–12)
RDW: 13.5 % (ref 11.5–15.5)

## 2012-07-11 LAB — SALICYLATE LEVEL: Salicylate Lvl: <2 mg/dL — ABNORMAL LOW (ref 2.8–20.0)

## 2012-07-11 LAB — ETHANOL: Alcohol, Ethyl (B): 67 mg/dL — ABNORMAL HIGH (ref 0–11)

## 2012-07-11 LAB — TROPONIN I: Troponin I: <0.3 ng/mL (ref <0.30)

## 2012-07-11 MED ORDER — NITROGLYCERIN IN D5W 200-5 MCG/ML-% IV SOLN
5.0000 ug/min | Freq: Once | INTRAVENOUS | Status: AC
  Administered 2012-07-11: 5 ug/min via INTRAVENOUS
  Filled 2012-07-11: qty 250

## 2012-07-11 MED ORDER — FLUOXETINE HCL 20 MG PO CAPS
20.0000 mg | ORAL_CAPSULE | Freq: Every day | ORAL | Status: DC
  Filled 2012-07-11 (×2): qty 1

## 2012-07-11 MED ORDER — MORPHINE SULFATE 4 MG/ML IJ SOLN
4.0000 mg | Freq: Once | INTRAMUSCULAR | Status: AC
  Administered 2012-07-11: 4 mg via INTRAVENOUS
  Filled 2012-07-11: qty 1

## 2012-07-11 MED ORDER — HYDROCODONE-ACETAMINOPHEN 7.5-325 MG PO TABS
1.0000 | ORAL_TABLET | Freq: Four times a day (QID) | ORAL | Status: DC | PRN
  Administered 2012-07-12 (×2): 1 via ORAL
  Filled 2012-07-11 (×2): qty 1

## 2012-07-11 MED ORDER — ASPIRIN 81 MG PO CHEW
CHEWABLE_TABLET | ORAL | Status: AC
  Administered 2012-07-11: 81 mg
  Filled 2012-07-11: qty 4

## 2012-07-11 MED ORDER — DIAZEPAM 5 MG PO TABS
10.0000 mg | ORAL_TABLET | Freq: Four times a day (QID) | ORAL | Status: DC | PRN
  Administered 2012-07-12: 10 mg via ORAL
  Filled 2012-07-11: qty 2

## 2012-07-11 MED ORDER — BENAZEPRIL HCL 10 MG PO TABS
40.0000 mg | ORAL_TABLET | Freq: Two times a day (BID) | ORAL | Status: DC
  Administered 2012-07-12: 40 mg via ORAL
  Filled 2012-07-11: qty 4
  Filled 2012-07-11 (×4): qty 1

## 2012-07-11 MED ORDER — AMLODIPINE BESYLATE 5 MG PO TABS: 10.0000 mg | ORAL_TABLET | Freq: Every day | ORAL | Status: DC

## 2012-07-11 NOTE — ED Notes (Addendum)
Pt reporting pain in left side of chest, states he does have some in right arm as well.  Reporting headache.  Reports pain began "about 10 min ago".  Pt somewhat uncooperative in answering questions.  Reports having been drinking today states "about a 40".

## 2012-07-11 NOTE — ED Provider Notes (Signed)
History   This chart was scribed for [employer]. [name], MD by Charolett Bumpers . The patient was seen in room APA07/APA07. [name] care was started at 2058.    CSN: [id number]  Arrival date & time 07/11/12  2039   First MD Initiated Contact with Patient 07/11/12 2058      Chief Complaint  Patient presents with  . Chest Pain    (Consider location/radiation/quality/duration/timing/severity/associated sxs/prior treatment) HPI Taylor Obrien is a 46 y.o. male who presents to the Emergency Department complaining of constant, moderate left-sided chest pain with an onset of 10 minutes PTA. Pt describes his chest pain as burning. Pt reports his symptoms are relieved with "calming down". Pt reports associated right arm pain and headache. Pt denies any associated cough or SOB. Pt reports a h/o similar chest pain but not this severe. Pt denies any h/o MI. Pt reports a h/o sleep apnea. Pt reports that he is a smoker. BP here in ED is 204/121. Pt reports drinking "about a 40" today. Pt reports taking X3 aspirin PTA with no relief.    Past Medical History  Diagnosis Date  . Essential hypertension, benign   . Hyperlipidemia   . Depression   . Chronic pain   . GERD (gastroesophageal reflux disease)     Past Surgical History  Procedure Date  . Right rotator cuff repair   . Closed reduction mandibular fracture w/ arch bars     + multiple extractions  . Removal foreign body right shoulder   . Condyloma resection     Family History  Problem Relation Age of Onset  . Diabetes Mother   . Hypertension Mother   . Diabetes Father   . Hypertension Father   . Diabetes Brother   . Hypertension Brother     History  Substance Use Topics  . Smoking status: Current Everyday Smoker -- 0.5 packs/day for 30 years    Types: Cigarettes  . Smokeless tobacco: Never Used  . Alcohol Use: No      Review of Systems  Respiratory: Negative for cough and shortness of breath.   Cardiovascular:  Positive for chest pain.  Musculoskeletal: Positive for myalgias.  Neurological: Positive for headaches.  All other systems reviewed and are negative.    Allergies  Oxcarbazepine  Home Medications   Current Outpatient Rx  Name Route Sig Dispense Refill  . AMLODIPINE BESYLATE 10 MG PO TABS Oral Take 10 mg by mouth daily.     . ASPIRIN 325 MG PO TABS Oral Take 975 mg by mouth 3 (three) times daily as needed.    Marland Kitchen BENAZEPRIL HCL 40 MG PO TABS Oral Take 40 mg by mouth 2 (two) times daily.      Marland Kitchen DIAZEPAM 5 MG PO TABS Oral Take 2 tablets (10 mg total) by mouth every 6 (six) hours as needed for anxiety. 20 tablet 0  . FLUOXETINE HCL 20 MG PO CAPS Oral Take 20 mg by mouth daily.      Marland Kitchen HYDROCODONE-ACETAMINOPHEN 7.5-325 MG PO TABS Oral Take 1 tablet by mouth every 6 (six) hours as needed. Pain    . IBUPROFEN 800 MG PO TABS Oral Take 1,600 mg by mouth 3 (three) times daily as needed. For pain    . PREDNISONE 50 MG PO TABS  One tab po QD 5 tablet 0    BP 137/87  Pulse 94  Temp 98.2 F (36.8 C) (Oral)  Resp 18  Ht 5\' 5"  (1.651 m)  Wt  220 lb (99.791 kg)  BMI 36.61 kg/m2  SpO2 98%  Physical Exam  Nursing note and vitals reviewed. Constitutional: He is oriented to person, place, and time. He appears well-developed and well-nourished. No distress.       Obese. Smells of alcohol.   HENT:  Head: Normocephalic and atraumatic.  Eyes: EOM are normal. Pupils are equal, round, and reactive to light.  Neck: Neck supple. No tracheal deviation present.  Cardiovascular: Normal rate.   Pulmonary/Chest: Effort normal and breath sounds normal. No respiratory distress. He has no rales. He exhibits no tenderness.       No crackles or signs of pneumonia.   Abdominal: Soft. He exhibits no distension. There is hepatosplenomegaly. There is no tenderness. There is no rebound and no guarding. No hernia.  Musculoskeletal: Normal range of motion. He exhibits no edema.  Neurological: He is alert and oriented  to person, place, and time. No sensory deficit.  Skin: Skin is warm and dry.  Psychiatric: He has a normal mood and affect. His behavior is normal.    ED Course  Procedures (including critical care time)  DIAGNOSTIC STUDIES: Oxygen Saturation is 97% on room air, normal by my interpretation.    COORDINATION OF CARE:  21:00-Medication Orders: Aspirin 81 mg chewable tablet-once  21:05-Discussed planned course of treatment with the patient, who is agreeable at this time.   21:15-Medication Orders: Nitroglycerin 0.2 mg/mL in dextrose 5% infusion-once; Morphine 4 mg/mL injection 4 mg-once.   Results for orders placed during the hospital encounter of 07/11/12  CBC WITH DIFFERENTIAL      Component Value Range   WBC 8.2  4.0 - 10.5 K/uL   RBC 4.86  4.22 - 5.81 MIL/uL   Hemoglobin 16.1  13.0 - 17.0 g/dL   HCT 16.1  09.6 - 04.5 %   MCV 95.5  78.0 - 100.0 fL   MCH 33.1  26.0 - 34.0 pg   MCHC 34.7  30.0 - 36.0 g/dL   RDW 40.9  81.1 - 91.4 %   Platelets 215  150 - 400 K/uL   Neutrophils Relative 65  43 - 77 %   Neutro Abs 5.3  1.7 - 7.7 K/uL   Lymphocytes Relative 27  12 - 46 %   Lymphs Abs 2.2  0.7 - 4.0 K/uL   Monocytes Relative 7  3 - 12 %   Monocytes Absolute 0.6  0.1 - 1.0 K/uL   Eosinophils Relative 2  0 - 5 %   Eosinophils Absolute 0.1  0.0 - 0.7 K/uL   Basophils Relative 0  0 - 1 %   Basophils Absolute 0.0  0.0 - 0.1 K/uL  COMPREHENSIVE METABOLIC PANEL      Component Value Range   Sodium 136  135 - 145 mEq/L   Potassium 3.3 (*) 3.5 - 5.1 mEq/L   Chloride 98  96 - 112 mEq/L   CO2 23  19 - 32 mEq/L   Glucose, Bld 122 (*) 70 - 99 mg/dL   BUN 14  6 - 23 mg/dL   Creatinine, Ser 7.82  0.50 - 1.35 mg/dL   Calcium 9.7  8.4 - 95.6 mg/dL   Total Protein 7.4  6.0 - 8.3 g/dL   Albumin 3.8  3.5 - 5.2 g/dL   AST 47 (*) 0 - 37 U/L   ALT 60 (*) 0 - 53 U/L   Alkaline Phosphatase 53  39 - 117 U/L   Total Bilirubin 0.3  0.3 - 1.2 mg/dL  GFR calc non Af Amer 66 (*) >90 mL/min   GFR  calc Af Amer 76 (*) >90 mL/min  TROPONIN I      Component Value Range   Troponin I <0.30  <0.30 ng/mL  ETHANOL      Component Value Range   Alcohol, Ethyl (B) 67 (*) 0 - 11 mg/dL  SALICYLATE LEVEL      Component Value Range   Salicylate Lvl <2.0 (*) 2.8 - 20.0 mg/dL     Dg Chest Port 1 View  07/11/2012  *RADIOLOGY REPORT*  Clinical Data: 46 year old male with chest pain.  PORTABLE CHEST - 1 VIEW  Comparison: 04/15/2012  Findings: Cardiomegaly and mild pulmonary congestion noted. There is no evidence of focal airspace disease, pulmonary edema, suspicious pulmonary nodule/mass, pleural effusion, or pneumothorax. No acute bony abnormalities are identified.  IMPRESSION: Cardiomegaly with mild pulmonary vascular congestion.  Original Report Authenticated By: Rosendo Gros, M.D.     1. Chest pain   2. Hypertensive emergency      Date: 07/11/2012  Rate: 103  Rhythm: sinus tachycardia  QRS Axis: normal  Intervals: normal  ST/T Wave abnormalities: ST elevations inferiorly and ST depressions laterally  Conduction Disutrbances:none  Narrative Interpretation: LVH with repol changes  Old EKG Reviewed: unchanged    MDM  Patient with chest pain. Also severe hypertension. EKG shows some chronic changes with ST depression inferiorly and laterally and severe LVH. These changes are relatively stable compared to previous. [name] chest pain is improved with improvement of his blood pressure. He'll be admitted for chest pain and hypertensive emergency.  I personally performed the services described in this documentation, which was scribed in my presence. The recorded information has been reviewed and considered.       Juliet Rude. [name], MD 07/11/12 [phone]

## 2012-07-11 NOTE — ED Notes (Signed)
Made Dr. [name] aware that patient states he takes 3 full strength ASA 3xdaily, most days for pain, in addition to drinking an avg. Of 40 oz. ETOH daily.

## 2012-07-12 LAB — MRSA PCR SCREENING: MRSA by PCR: NEGATIVE

## 2012-07-12 LAB — CARDIAC PANEL(CRET KIN+CKTOT+MB+TROPI)
CK, MB: 6.1 ng/mL (ref 0.3–4.0)
Relative Index: 2.7 — ABNORMAL HIGH (ref 0.0–2.5)
Total CK: 229 U/L (ref 7–232)
Troponin I: 0.79 ng/mL (ref <0.30)

## 2012-07-12 MED ORDER — ENOXAPARIN SODIUM 40 MG/0.4ML ~~LOC~~ SOLN: 40.0000 mg | SUBCUTANEOUS | Status: DC

## 2012-07-12 MED ORDER — NITROGLYCERIN IN D5W 200-5 MCG/ML-% IV SOLN: 5.0000 ug/min | Freq: Once | INTRAVENOUS | Status: DC

## 2012-07-12 MED ORDER — SODIUM CHLORIDE 0.9 % IV SOLN: INTRAVENOUS | Status: DC

## 2012-07-12 MED ORDER — PNEUMOCOCCAL VAC POLYVALENT 25 MCG/0.5ML IJ INJ
0.5000 mL | INJECTION | INTRAMUSCULAR | Status: DC
  Filled 2012-07-12: qty 0.5

## 2012-07-12 MED ORDER — ACETAMINOPHEN 500 MG PO TABS: 500.0000 mg | ORAL_TABLET | ORAL | Status: DC | PRN

## 2012-07-12 MED ORDER — SODIUM CHLORIDE 0.9 % IJ SOLN
INTRAMUSCULAR | Status: AC
  Administered 2012-07-12: 3 mL
  Filled 2012-07-12: qty 3

## 2012-07-12 NOTE — Progress Notes (Signed)
md notified of ckmb of 6.1 and troponin of 0.79 and ekg results. New orders received and noted.

## 2012-07-12 NOTE — Progress Notes (Signed)
Subjective: Patient was admitted last night with chest pain and hypertensive crisis. He is feeling better today. His chest pain subsided. No headache.   Objective: Vital signs in last 24 hours: Temp:  [97.9 F (36.6 C)-98.5 F (36.9 C)] 97.9 F (36.6 C) (08/04 0800) Pulse Rate:  [63-103] 71  (08/04 0815) Resp:  [12-20] 16  (08/04 0815) BP: (104-204)/(54-121) 141/87 mmHg (08/04 0815) SpO2:  [93 %-100 %] 100 % (08/04 0815) Weight:  [95.4 kg (210 lb 5.1 oz)-99.791 kg (220 lb)] 96 kg (211 lb 10.3 oz) (08/04 0500) Weight change:  Last BM Date: 07/11/12  Intake/Output from previous day: 08/03 0701 - 08/04 0700 In: 254.7 [P.O.:240; I.V.:14.7] Out: -   PHYSICAL EXAM General appearance: alert and no distress Resp: clear to auscultation bilaterally Cardio: regular rate and rhythm, S1, S2 normal, no murmur, click, rub or gallop GI: soft, non-tender; bowel sounds normal; no masses,  no organomegaly Extremities: extremities normal, atraumatic, no cyanosis or edema  Lab Results:    @labtest @ [facility] No results found for this basename: PHART,PCO2,PO2ART,TCO2,HCO3 in the last 72 hours CULTURES Recent Results (from the past 240 hour(s))  MRSA PCR SCREENING     Status: Normal   Collection Time   07/12/12 12:18 AM      Component Value Range Status Comment   MRSA by PCR NEGATIVE  NEGATIVE Final    Studies/Results: Dg Chest Port 1 View  07/11/2012  *RADIOLOGY REPORT*  Clinical Data: 46 year old male with chest pain.  PORTABLE CHEST - 1 VIEW  Comparison: 04/15/2012  Findings: Cardiomegaly and mild pulmonary congestion noted. There is no evidence of focal airspace disease, pulmonary edema, suspicious pulmonary nodule/mass, pleural effusion, or pneumothorax. No acute bony abnormalities are identified.  IMPRESSION: Cardiomegaly with mild pulmonary vascular congestion.  Original Report Authenticated By: Rosendo Gros, M.D.    Medications: I have reviewed the patient's current  medications.  Assesment: 1.chest pain 2. Hypertensive crisis 3. Back pain Active Problems:  * No active hospital problems. *     Plan: 1. Continue telemetry 2. Continue serial EKG and cardiac enzyme 3. Cardiology consult.    LOS: 1 day   Monseratt Ledin 07/12/2012, 9:48 AM

## 2012-07-12 NOTE — H&P (Signed)
NAMEMarland Obrien  [name], [name] NO.:  [id number]  MEDICAL RECORD NO.:  [id number]  LOCATION:  IC09                          FACILITY:  [facility]  PHYSICIAN:  Mila Homer. Sudie Bailey, M.D.DATE OF BIRTH:  06-04-1966  DATE OF ADMISSION:  07/11/2012 DATE OF DISCHARGE:  LH                             HISTORY & PHYSICAL   This 46 year old developed chest pain and pain radiating down the left arm, and came to the emergency room tonight.  He has a long history: 1. Hypertension, which can be difficult to control. 2. Hyperlipidemia. 3. Depression. 4. Chronic low back pain. 5. Reflux esophagitis.  He smoked cigarettes from age 89, currently about a pack a day.  He has a family history of CAD, hypertension, diabetes, with diabetes and hypertension in both his mother, father, and brother.  He is currently married.  He has 4 children at home with 3 children who have left home.  He sees Dr. Felecia Shelling.  CURRENT MEDICATIONS: 1. Amlodipine 10 mg daily. 2. Benazepril 40 mg daily. 3. Diazepam 5 mg 2 q.6 hours for anxiety. 4. Fluoxetine 20 mg daily. 5. Hydrocodone/APAP 7.5/325 q.6 hours for pain. 6. Ibuprofen 800 mg which he apparently uses two 3 times a day. 7. Prednisone.  When he developed the chest discomfort, he took 3 regular strength aspirins.  In the emergency room, he had a blood pressure of 204/121, respiratory rate 20, pulse 103, temperature 98.2, and O2 saturation 97%. By the time I saw him, after nitroglycerin drip, 4 mg of morphine, 81 mg of aspirin, his chest discomfort had cleared and he was feeling better. He was alert and oriented.  He did give a fairly good history. SKIN:  Turgor was normal. HEART:  Regular rhythm, rate of about 90 on my exam. LUNGS:  Appeared to be clear throughout, but with somewhat decreased breath sounds. ABDOMEN:  Soft without organomegaly or mass. He had 1+ dorsalis pedis pulses.  White cell count was 8,200 of which 65% neutrophils, hemoglobin  16.1. His Chem profile showed a potassium 3.3, BUN 14, creatinine 1.3.  His glucose is 122.  AST 47, ALT 61, and troponin less than 0.30.  Chest x-ray showed cardiomegaly and mild pulmonary vascular congestion.  Recent lumbar spine was negative.  ADMISSION DIAGNOSES: 1. Hypertensive emergency. 2. Chest pain and pressure, probably secondary to hypertensive     cardiovascular disease. 3. Tobacco use disorder. 4. Hyperlipidemia. 5. Depression. He will be continued on the same outpatient antihypertensive meds, is currently on nitroglycerin drip.  We will get his blood pressure under control, recheck an EKG and enzymes in the morning, and Dr. Felecia Shelling, his local medical doctor, will see him at that time.  I talked to him about getting off cigarettes.  He tried Chantix in the past, but his depression got significantly worse with this.     Mila Homer. Sudie Bailey, M.D.     SDK/MEDQ  D:  07/11/2012  T:  07/12/2012  Job:  [id number]

## 2012-07-12 NOTE — Progress Notes (Signed)
Patient signed Non-responsibility for Leaving Against Medical Advice form, witnessed by Billy Coast, RN at [phone]. Dr. Felecia Shelling aware.

## 2012-07-24 NOTE — Progress Notes (Signed)
UR chart review completed.  

## 2012-08-04 NOTE — Discharge Summary (Signed)
Physician Discharge Summary  Patient ID: Taylor Obrien MRN: [id number] DOB/AGE: [id number] y.o. Primary Care Physician:Shontel Santee, MD Admit date: 07/11/2012 Discharge date: 08/04/2012    Discharge Diagnoses:  1.chest pain  2. Hypertensive crisis  3. Back pain  Active Problems:  * No active hospital problems. *    Medication List  As of 08/04/2012  9:27 PM   ASK your doctor about these medications         amLODipine 10 MG tablet   Commonly known as: NORVASC   Take 10 mg by mouth daily.      aspirin 325 MG tablet   Take 975 mg by mouth 3 (three) times daily as needed.      benazepril 40 MG tablet   Commonly known as: LOTENSIN   Take 40 mg by mouth 2 (two) times daily.      diazepam 5 MG tablet   Commonly known as: VALIUM   Take 2 tablets (10 mg total) by mouth every 6 (six) hours as needed for anxiety.      FLUoxetine 20 MG capsule   Commonly known as: PROZAC   Take 20 mg by mouth daily.      HYDROcodone-acetaminophen 7.5-325 MG per tablet   Commonly known as: NORCO   Take 1 tablet by mouth every 6 (six) hours as needed. Pain      ibuprofen 800 MG tablet   Commonly known as: ADVIL,MOTRIN   Take 1,600 mg by mouth 3 (three) times daily as needed. For pain      predniSONE 50 MG tablet   Commonly known as: DELTASONE   One tab po QD            Discharged Condition:stable    Consults: Cardiology consult pending  Significant Diagnostic Studies: Dg Chest Port 1 View  07/11/2012  *RADIOLOGY REPORT*  Clinical Data: 46 year old male with chest pain.  PORTABLE CHEST - 1 VIEW  Comparison: 04/15/2012  Findings: Cardiomegaly and mild pulmonary congestion noted. There is no evidence of focal airspace disease, pulmonary edema, suspicious pulmonary nodule/mass, pleural effusion, or pneumothorax. No acute bony abnormalities are identified.  IMPRESSION: Cardiomegaly with mild pulmonary vascular congestion.  Original Report Authenticated By: Rosendo Gros, M.D.    Lab  Results: Basic Metabolic Panel: No results found for this basename: NA:2,K:2,CL:2,CO2:2,GLUCOSE:2,BUN:2,CREATININE:2,CALCIUM:2,MG:2,PHOS:2 in the last 72 hours Liver Function Tests: No results found for this basename: AST:2,ALT:2,ALKPHOS:2,BILITOT:2,PROT:2,ALBUMIN:2 in the last 72 hours   CBC: No results found for this basename: WBC:2,NEUTROABS:2,HGB:2,HCT:2,MCV:2,PLT:2 in the last 72 hours  No results found for this or any previous visit (from the past 240 hour(s)).   Hospital Course:  Patient was admitted due to chest pain and hypertensive crisis, however patient signed out against medical advise before his evaluation was completed.  Discharge Exam: Blood pressure 141/87, pulse 71, temperature 97.9 F (36.6 C), temperature source Oral, resp. rate 14, height 5\' 5"  (1.651 m), weight 96 kg (211 lb 10.3 oz), SpO2 100.00%.   Disposition: home      Signed: Rennie Rouch   08/04/2012, 9:27 PM

## 2012-08-21 ENCOUNTER — Encounter: Payer: Self-pay | Admitting: Cardiology

## 2012-08-25 ENCOUNTER — Emergency Department (HOSPITAL_COMMUNITY): Payer: Medicaid Other

## 2012-08-25 ENCOUNTER — Emergency Department (HOSPITAL_COMMUNITY)
Admission: EM | Admit: 2012-08-25 | Discharge: 2012-08-25 | Disposition: A | Discharge status: Home / Self Care | Payer: Medicaid Other | Attending: Emergency Medicine | Admitting: Emergency Medicine

## 2012-08-25 ENCOUNTER — Encounter (HOSPITAL_COMMUNITY): Payer: Self-pay | Admitting: *Deleted

## 2012-08-25 DIAGNOSIS — Y9389 Activity, other specified: Secondary | ICD-10-CM | POA: Insufficient documentation

## 2012-08-25 DIAGNOSIS — F17200 Nicotine dependence, unspecified, uncomplicated: Secondary | ICD-10-CM | POA: Insufficient documentation

## 2012-08-25 DIAGNOSIS — G8929 Other chronic pain: Secondary | ICD-10-CM | POA: Insufficient documentation

## 2012-08-25 DIAGNOSIS — S838X9A Sprain of other specified parts of unspecified knee, initial encounter: Secondary | ICD-10-CM | POA: Insufficient documentation

## 2012-08-25 DIAGNOSIS — X500XXA Overexertion from strenuous movement or load, initial encounter: Secondary | ICD-10-CM | POA: Insufficient documentation

## 2012-08-25 DIAGNOSIS — E785 Hyperlipidemia, unspecified: Secondary | ICD-10-CM | POA: Insufficient documentation

## 2012-08-25 DIAGNOSIS — K219 Gastro-esophageal reflux disease without esophagitis: Secondary | ICD-10-CM | POA: Insufficient documentation

## 2012-08-25 DIAGNOSIS — Y998 Other external cause status: Secondary | ICD-10-CM | POA: Insufficient documentation

## 2012-08-25 DIAGNOSIS — S86919A Strain of unspecified muscle(s) and tendon(s) at lower leg level, unspecified leg, initial encounter: Secondary | ICD-10-CM

## 2012-08-25 MED ORDER — HYDROCODONE-ACETAMINOPHEN 5-500 MG PO TABS: 1.0000 | ORAL_TABLET | Freq: Four times a day (QID) | ORAL | Status: DC | PRN

## 2012-08-25 MED ORDER — NAPROXEN 500 MG PO TABS: 500.0000 mg | ORAL_TABLET | Freq: Two times a day (BID) | ORAL | Status: DC

## 2012-08-25 MED ORDER — METHOCARBAMOL 500 MG PO TABS: 500.0000 mg | ORAL_TABLET | Freq: Two times a day (BID) | ORAL | Status: DC | PRN

## 2012-08-25 MED ORDER — KETOROLAC TROMETHAMINE 60 MG/2ML IM SOLN
60.0000 mg | Freq: Once | INTRAMUSCULAR | Status: AC
  Administered 2012-08-25: 60 mg via INTRAMUSCULAR
  Filled 2012-08-25: qty 2

## 2012-08-25 NOTE — ED Notes (Signed)
Patient states he has always had high blood pressure. RN made aware.

## 2012-08-25 NOTE — ED Provider Notes (Signed)
History     CSN: [id number]  Arrival date & time 08/25/12  0340   First MD Initiated Contact with Patient 08/25/12 [phone]      Chief Complaint  Patient presents with  . Leg Pain    (Consider location/radiation/quality/duration/timing/severity/associated sxs/prior treatment) HPI Comments: 46 year old male who presents with right lower extremity pain. He states that earlier in the day he was helping somebody in a wheelchair and as he tried to push to person up the stairs he fell backwards pulling his right leg, twisting his right knee and falling to the ground. His pain has been constant, gradually worsening, located in the posterior calf on the right and radiating up to the right knee and just above the right knee. It is worse with ambulation, worse with dorsiflexion of the foot and extension of the knee. He denies numbness or weakness of the leg on the right and denies any other pain including no back pain neck pain or other lower extremity pain.  Patient is a 46 y.o. male presenting with leg pain. The history is provided by the patient.  Leg Pain  Pertinent negatives include no numbness.    Past Medical History  Diagnosis Date  . Essential hypertension, benign   . Hyperlipidemia   . Depression   . Chronic pain   . GERD (gastroesophageal reflux disease)     Past Surgical History  Procedure Date  . Right rotator cuff repair   . Closed reduction mandibular fracture w/ arch bars     + multiple extractions  . Removal foreign body right shoulder   . Condyloma resection     Family History  Problem Relation Age of Onset  . Diabetes Mother   . Hypertension Mother   . Diabetes Father   . Hypertension Father   . Diabetes Brother   . Hypertension Brother     History  Substance Use Topics  . Smoking status: Current Every Day Smoker -- 0.5 packs/day for 30 years    Types: Cigarettes  . Smokeless tobacco: Never Used  . Alcohol Use: No      Review of Systems    Musculoskeletal: Positive for gait problem. Negative for back pain and joint swelling.  Skin: Negative for wound.  Neurological: Negative for weakness and numbness.    Allergies  Oxcarbazepine  Home Medications   Current Outpatient Rx  Name Route Sig Dispense Refill  . AMLODIPINE BESYLATE 10 MG PO TABS Oral Take 10 mg by mouth daily.     . ASPIRIN 325 MG PO TABS Oral Take 975 mg by mouth 3 (three) times daily as needed.    Marland Kitchen BENAZEPRIL HCL 40 MG PO TABS Oral Take 40 mg by mouth 2 (two) times daily.      Marland Kitchen FLUOXETINE HCL 20 MG PO CAPS Oral Take 20 mg by mouth daily.      Marland Kitchen HYDROCODONE-ACETAMINOPHEN 7.5-325 MG PO TABS Oral Take 1 tablet by mouth every 6 (six) hours as needed. Pain    . HYDROCODONE-ACETAMINOPHEN 5-500 MG PO TABS Oral Take 1-2 tablets by mouth every 6 (six) hours as needed for pain. 15 tablet 0  . IBUPROFEN 800 MG PO TABS Oral Take 1,600 mg by mouth 3 (three) times daily as needed. For pain    . METHOCARBAMOL 500 MG PO TABS Oral Take 1 tablet (500 mg total) by mouth 2 (two) times daily as needed. 20 tablet 0  . NAPROXEN 500 MG PO TABS Oral Take 1 tablet (500 mg total) by  mouth 2 (two) times daily with a meal. 30 tablet 0    BP 182/104  Pulse 87  Temp 98.2 F (36.8 C) (Oral)  Resp 16  Ht 5\' 5"  (1.651 m)  Wt 205 lb (92.987 kg)  BMI 34.11 kg/m2  SpO2 96%  Physical Exam  Nursing note and vitals reviewed. Constitutional: He appears well-developed and well-nourished. No distress.  HENT:  Head: Normocephalic and atraumatic.  Eyes: Conjunctivae normal are normal. No scleral icterus.  Musculoskeletal: He exhibits tenderness ( dec ROM of the R knee 2/2 pain, has ttp in the calf, normal achilles tendon and preserved ability to dorsi and plantar flex the R foot despite pain). He exhibits no edema.       No ttp over the foot or the ankle on the R  Neurological: He is alert. Coordination normal.  Skin: Skin is warm and dry. No rash noted.    ED Course  Procedures  (including critical care time)  Labs Reviewed - No data to display Dg Knee Complete 4 Views Right  08/25/2012  *RADIOLOGY REPORT*  Clinical Data: Twisting injury.  Pain.  RIGHT KNEE - COMPLETE 4+ VIEW  Comparison: None.  Findings: Imaged bones, joints and soft tissues appear normal.  IMPRESSION: Normal study.   Original Report Authenticated By: Bernadene Bell. D'ALESSIO, M.D.      1. Muscle strain of lower leg       MDM  R/o frx, pain meds ordered, likely muscle strain.  Toradol IM ordered  X-ray negative for fracture, no dislocation, pain medications prescribed for home, the patient is well-appearing overall and in no acute distress.   Discharge Prescriptions include:  Robaxin Naprosyn Hydrocodone / apap   [name], MD 08/25/12 [phone]

## 2012-08-25 NOTE — ED Notes (Signed)
Patient was helping to transfer someone to car and pulled  Muscle right leg

## 2012-08-28 ENCOUNTER — Encounter: Payer: Self-pay | Admitting: *Deleted

## 2012-08-31 ENCOUNTER — Encounter: Payer: Self-pay | Admitting: Cardiology

## 2012-08-31 ENCOUNTER — Ambulatory Visit (INDEPENDENT_AMBULATORY_CARE_PROVIDER_SITE_OTHER): Payer: Medicaid Other | Admitting: Cardiology

## 2012-08-31 VITALS — BP 172/110 | HR 71 | Ht 65.0 in | Wt 213.0 lb

## 2012-08-31 DIAGNOSIS — I10 Essential (primary) hypertension: Secondary | ICD-10-CM

## 2012-08-31 DIAGNOSIS — I119 Hypertensive heart disease without heart failure: Secondary | ICD-10-CM

## 2012-08-31 DIAGNOSIS — R072 Precordial pain: Secondary | ICD-10-CM

## 2012-08-31 MED ORDER — SPIRONOLACTONE 25 MG PO TABS: 25.0000 mg | ORAL_TABLET | Freq: Every day | ORAL | Status: DC

## 2012-08-31 MED ORDER — LABETALOL HCL 200 MG PO TABS: 200.0000 mg | ORAL_TABLET | Freq: Two times a day (BID) | ORAL | Status: DC

## 2012-08-31 MED FILL — Hydrocodone-Acetaminophen Tab 5-325 MG: ORAL | Qty: 6 | Status: AC

## 2012-08-31 NOTE — Assessment & Plan Note (Signed)
Patient indicates a fairly long-standing history of poorly controlled hypertension. He states he has been taking his medicines as directed. I discussed with him regular walking regimen, weight loss, also strict sodium restriction. He will likely require additional multimodal therapy for better blood pressure control. I reviewed his recent lab work. He states that he did not tolerate HCTZ. We will try Aldactone 25 mg daily, also add low-dose labetalol 200 mg twice daily. Office followup is arranged.

## 2012-08-31 NOTE — Progress Notes (Signed)
Clinical Summary Taylor Obrien is a 46 y.o.male referred for cardiology consultation by Dr. Felecia Shelling. He was actually referred back in May, and is only now presenting. Record review finds recent hospital admission in August with chest pain and HTN urgency - signed out AMA prior to full evaluation. Single troponin I was normal at that time. CXR reported cardiomegaly with mild vascular congestion. ECG from that time showed sinus rhythm with LVH, BAE, and significant repolarization abnormalities. Today's tracing is similar.  He states that he has been compliant with his medications, on similar treatment over the last few years. We discussed salt restriction and walking regimen, also smoking cessation.  He describes intermittent chest pain, generally a tightness, some discomfort in his left arm and also left leg. Back pain also occurs at times. He reports functional limitation related to his back pain, no regular exercise regimen. We discussed his diet, specifically sodium restriction.  States he has been told in the past he has an "enlarged heart." No recent imaging noted.  Allergies  Allergen Reactions  . Oxcarbazepine Other (See Comments)    Patient goes out of right state of mind.     Current Outpatient Prescriptions  Medication Sig Dispense Refill  . amLODipine (NORVASC) 10 MG tablet Take 10 mg by mouth daily.       . benazepril (LOTENSIN) 40 MG tablet Take 40 mg by mouth 2 (two) times daily.        . cyclobenzaprine (FLEXERIL) 10 MG tablet Take 10 mg by mouth 3 (three) times daily as needed.      Marland Kitchen FLUoxetine (PROZAC) 20 MG capsule Take 20 mg by mouth daily.        Marland Kitchen HYDROcodone-acetaminophen (VICODIN) 5-500 MG per tablet Take 1-2 tablets by mouth every 6 (six) hours as needed for pain.  15 tablet  0  . ibuprofen (ADVIL,MOTRIN) 800 MG tablet Take 1,600 mg by mouth 3 (three) times daily as needed. For pain      . naproxen (NAPROSYN) 500 MG tablet Take 1 tablet (500 mg total) by mouth 2 (two)  times daily with a meal.  30 tablet  0  . labetalol (NORMODYNE) 200 MG tablet Take 1 tablet (200 mg total) by mouth 2 (two) times daily.  60 tablet  11  . spironolactone (ALDACTONE) 25 MG tablet Take 1 tablet (25 mg total) by mouth daily.  30 tablet  11    Past Medical History  Diagnosis Date  . Essential hypertension, benign   . Hyperlipidemia   . Depression   . Chronic pain   . GERD (gastroesophageal reflux disease)     Past Surgical History  Procedure Date  . Right rotator cuff repair   . Closed reduction mandibular fracture w/ arch bars     + multiple extractions  . Removal foreign body right shoulder   . Condyloma resection     Family History  Problem Relation Age of Onset  . Diabetes Mother   . Hypertension Mother   . Diabetes Father   . Hypertension Father   . Diabetes Brother   . Hypertension Brother     Social History Taylor Obrien reports that he has been smoking Cigarettes.  He has a 15 pack-year smoking history. He has never used smokeless tobacco. Taylor Obrien reports that he does not drink alcohol.  Review of Systems Denies any syncope, palpitations, bleeding problems, orthopnea, or PND. Reports chronic "arthritis" pain. Otherwise negative except as outlined.  Physical Examination Filed Vitals:  08/31/12 0945  BP: 172/110  Pulse: 71   Filed Weights   08/31/12 0945  Weight: 213 lb (96.616 kg)   Patient no acute distress. HEENT: Conjunctiva and lids normal, oropharynx clear. Neck: Supple, no elevated JVP or carotid bruits, no thyromegaly. Lungs: Clear to auscultation, nonlabored breathing at rest. Cardiac: Regular rate and rhythm, no S3, 2/6 systolic murmur, S4 noted, no pericardial rub. Abdomen: Soft, nontender, protuberant, bowel sounds present, no guarding or rebound. Extremities: No pitting edema, distal pulses 2+. Skin: Warm and dry. Musculoskeletal: No kyphosis. Neuropsychiatric: Alert and oriented x3, affect grossly appropriate.   Problem  List and Plan   Accelerated hypertension Patient indicates a fairly long-standing history of poorly controlled hypertension. He states he has been taking his medicines as directed. I discussed with him regular walking regimen, weight loss, also strict sodium restriction. He will likely require additional multimodal therapy for better blood pressure control. I reviewed his recent lab work. He states that he did not tolerate HCTZ. We will try Aldactone 25 mg daily, also add low-dose labetalol 200 mg twice daily. Office followup is arranged.  Hypertensive heart disease Echocardiogram to be obtained to assess LV size, EF, also wall thickness.  Precordial pain Depending on how he does on broadened medical therapy for additional blood pressure control, we will consider whether further evaluation is needed in the short-term. He denies any further episodes beyond his admission back in August.    [name], M.D., F.A.C.C.

## 2012-08-31 NOTE — Assessment & Plan Note (Signed)
Echocardiogram to be obtained to assess LV size, EF, also wall thickness.

## 2012-08-31 NOTE — Assessment & Plan Note (Signed)
Depending on how he does on broadened medical therapy for additional blood pressure control, we will consider whether further evaluation is needed in the short-term. He denies any further episodes beyond his admission back in August.

## 2012-08-31 NOTE — Patient Instructions (Addendum)
Your physician recommends that you schedule a follow-up appointment in: 1 month  Your physician has requested that you have an echocardiogram. Echocardiography is a painless test that uses sound waves to create images of your heart. It provides your doctor with information about the size and shape of your heart and how well your heart's chambers and valves are working. This procedure takes approximately one hour. There are no restrictions for this procedure.  Your physician has recommended you make the following change in your medication:  1 - START Aldactone 25 mg daily 2 - START Labetalol 200 mg twice daily

## 2012-09-02 ENCOUNTER — Ambulatory Visit (HOSPITAL_COMMUNITY)
Admission: RE | Admit: 2012-09-02 | Discharge: 2012-09-02 | Disposition: A | Discharge status: Home / Self Care | Payer: Medicaid Other | Source: Ambulatory Visit | Attending: Cardiology | Admitting: Cardiology

## 2012-09-02 DIAGNOSIS — R072 Precordial pain: Secondary | ICD-10-CM

## 2012-09-02 DIAGNOSIS — E785 Hyperlipidemia, unspecified: Secondary | ICD-10-CM | POA: Insufficient documentation

## 2012-09-02 DIAGNOSIS — I10 Essential (primary) hypertension: Secondary | ICD-10-CM | POA: Insufficient documentation

## 2012-09-02 DIAGNOSIS — I119 Hypertensive heart disease without heart failure: Secondary | ICD-10-CM

## 2012-09-02 DIAGNOSIS — R079 Chest pain, unspecified: Secondary | ICD-10-CM | POA: Insufficient documentation

## 2012-09-02 NOTE — Progress Notes (Signed)
  Echocardiogram 2D Echocardiogram has been performed.  Antony Salmon, RCS 09/02/2012, 9:07 AM

## 2012-09-03 ENCOUNTER — Other Ambulatory Visit (HOSPITAL_COMMUNITY): Payer: Self-pay | Admitting: Orthopaedic Surgery

## 2012-09-03 ENCOUNTER — Encounter: Payer: Self-pay | Admitting: *Deleted

## 2012-09-03 DIAGNOSIS — M545 Low back pain, unspecified: Secondary | ICD-10-CM

## 2012-09-08 ENCOUNTER — Ambulatory Visit (HOSPITAL_COMMUNITY)
Admission: RE | Admit: 2012-09-08 | Discharge: 2012-09-08 | Disposition: A | Discharge status: Home / Self Care | Payer: Medicaid Other | Source: Ambulatory Visit | Attending: Orthopaedic Surgery | Admitting: Orthopaedic Surgery

## 2012-09-08 DIAGNOSIS — M545 Low back pain, unspecified: Secondary | ICD-10-CM | POA: Insufficient documentation

## 2012-09-08 DIAGNOSIS — M79609 Pain in unspecified limb: Secondary | ICD-10-CM | POA: Insufficient documentation

## 2012-09-18 ENCOUNTER — Telehealth: Payer: Self-pay | Admitting: *Deleted

## 2012-09-18 NOTE — Telephone Encounter (Signed)
Patient notified of Echocardiogram results.

## 2012-10-02 ENCOUNTER — Ambulatory Visit: Payer: Medicaid Other | Admitting: Cardiology

## 2012-10-06 ENCOUNTER — Ambulatory Visit: Payer: Medicaid Other | Admitting: Cardiology

## 2012-10-08 ENCOUNTER — Encounter (HOSPITAL_COMMUNITY): Payer: Self-pay | Admitting: Emergency Medicine

## 2012-10-08 ENCOUNTER — Emergency Department (HOSPITAL_COMMUNITY): Payer: Medicaid Other

## 2012-10-08 ENCOUNTER — Observation Stay (HOSPITAL_COMMUNITY)
Admission: EM | Admit: 2012-10-08 | Discharge: 2012-10-09 | Discharge status: Against Medical Advice | Payer: Medicaid Other | Attending: Internal Medicine | Admitting: Internal Medicine

## 2012-10-08 DIAGNOSIS — M549 Dorsalgia, unspecified: Secondary | ICD-10-CM | POA: Insufficient documentation

## 2012-10-08 DIAGNOSIS — G8929 Other chronic pain: Secondary | ICD-10-CM | POA: Diagnosis present

## 2012-10-08 DIAGNOSIS — Z720 Tobacco use: Secondary | ICD-10-CM | POA: Diagnosis present

## 2012-10-08 DIAGNOSIS — F329 Major depressive disorder, single episode, unspecified: Secondary | ICD-10-CM | POA: Insufficient documentation

## 2012-10-08 DIAGNOSIS — Z23 Encounter for immunization: Secondary | ICD-10-CM | POA: Insufficient documentation

## 2012-10-08 DIAGNOSIS — R079 Chest pain, unspecified: Secondary | ICD-10-CM | POA: Diagnosis present

## 2012-10-08 DIAGNOSIS — F17200 Nicotine dependence, unspecified, uncomplicated: Secondary | ICD-10-CM | POA: Insufficient documentation

## 2012-10-08 DIAGNOSIS — F3289 Other specified depressive episodes: Secondary | ICD-10-CM | POA: Insufficient documentation

## 2012-10-08 DIAGNOSIS — I10 Essential (primary) hypertension: Principal | ICD-10-CM | POA: Diagnosis present

## 2012-10-08 DIAGNOSIS — R0602 Shortness of breath: Secondary | ICD-10-CM | POA: Insufficient documentation

## 2012-10-08 DIAGNOSIS — F1210 Cannabis abuse, uncomplicated: Secondary | ICD-10-CM | POA: Insufficient documentation

## 2012-10-08 DIAGNOSIS — K219 Gastro-esophageal reflux disease without esophagitis: Secondary | ICD-10-CM

## 2012-10-08 LAB — POCT I-STAT TROPONIN I: Troponin i, poc: 0.1 ng/mL (ref 0.00–0.08)

## 2012-10-08 LAB — BASIC METABOLIC PANEL
BUN: 12 mg/dL (ref 6–23)
Chloride: 101 mEq/L (ref 96–112)
GFR calc non Af Amer: 82 mL/min — ABNORMAL LOW (ref >90)
Glucose, Bld: 129 mg/dL — ABNORMAL HIGH (ref 70–99)
Potassium: 3.3 mEq/L — ABNORMAL LOW (ref 3.5–5.1)

## 2012-10-08 LAB — CBC
HCT: 44.9 % (ref 39.0–52.0)
Hemoglobin: 15.8 g/dL (ref 13.0–17.0)
MCHC: 35.2 g/dL (ref 30.0–36.0)

## 2012-10-08 MED ORDER — ASPIRIN 81 MG PO CHEW
324.0000 mg | CHEWABLE_TABLET | Freq: Once | ORAL | Status: AC
  Administered 2012-10-08: 324 mg via ORAL

## 2012-10-08 MED ORDER — ASPIRIN 81 MG PO CHEW
CHEWABLE_TABLET | ORAL | Status: AC
  Filled 2012-10-08: qty 4

## 2012-10-08 MED ORDER — NITROGLYCERIN IN D5W 200-5 MCG/ML-% IV SOLN
5.0000 ug/min | INTRAVENOUS | Status: DC
  Administered 2012-10-08: 5 ug/min via INTRAVENOUS
  Filled 2012-10-08: qty 250

## 2012-10-08 MED ORDER — MORPHINE SULFATE 4 MG/ML IJ SOLN
4.0000 mg | Freq: Once | INTRAMUSCULAR | Status: AC
  Administered 2012-10-08: 4 mg via INTRAVENOUS
  Filled 2012-10-08: qty 1

## 2012-10-08 MED ORDER — ONDANSETRON HCL 4 MG/2ML IJ SOLN
4.0000 mg | Freq: Once | INTRAMUSCULAR | Status: AC
  Administered 2012-10-08: 4 mg via INTRAVENOUS
  Filled 2012-10-08: qty 2

## 2012-10-08 NOTE — ED Provider Notes (Signed)
History   This chart was scribed for Sunnie Nielsen, MD, by Frederik Pear. The patient was seen in room APA18/APA18 and the patient's care was started at 2300.    CSN: [id number]  Arrival date & time 10/08/12  2236   First MD Initiated Contact with Patient 10/08/12 2300      Chief Complaint  Patient presents with  . Chest Pain  . Back Pain    (Consider location/radiation/quality/duration/timing/severity/associated sxs/prior treatment) HPI Comments: Taylor Obrien is a 46 y.o. male who presents to the Emergency Department complaining of severe, constant chest pain that radiates to the back and began approximately 35 minutes ago while at rest. He reports that he has experienced similar episodes of previous pain. He states that he has been on BP medication, but ran out 5 days ago. He reports that he had a cardiac catherisation a month ago and was supposed to follow up with his physician this week, but forgot. He has no h/o MI. The patient is a current every day smoker.   PCP is Dr. Felecia Shelling.  Patient is a 46 y.o. male presenting with chest pain and back pain. The history is limited by the condition of the patient.  Chest Pain Primary symptoms include shortness of breath. Pertinent negatives for primary symptoms include no fever, no abdominal pain, no nausea and no vomiting.    Back Pain  Associated symptoms include chest pain. Pertinent negatives include no fever and no abdominal pain.    Past Medical History  Diagnosis Date  . Essential hypertension, benign   . Hyperlipidemia   . Depression   . Chronic pain   . GERD (gastroesophageal reflux disease)     Past Surgical History  Procedure Date  . Right rotator cuff repair   . Closed reduction mandibular fracture w/ arch bars     + multiple extractions  . Removal foreign body right shoulder   . Condyloma resection     Family History  Problem Relation Age of Onset  . Diabetes Mother   . Hypertension Mother   . Diabetes Father    . Hypertension Father   . Diabetes Brother   . Hypertension Brother     History  Substance Use Topics  . Smoking status: Current Every Day Smoker -- 0.5 packs/day for 30 years    Types: Cigarettes  . Smokeless tobacco: Never Used  . Alcohol Use: 0.0 oz/week      Review of Systems  Constitutional: Negative for fever.  Respiratory: Positive for shortness of breath.   Cardiovascular: Positive for chest pain.  Gastrointestinal: Negative for nausea, vomiting, abdominal pain and diarrhea.  Musculoskeletal: Positive for back pain.  Skin: Negative for rash.  All other systems reviewed and are negative.    Allergies  Oxcarbazepine  Home Medications   Current Outpatient Rx  Name Route Sig Dispense Refill  . AMLODIPINE BESYLATE 10 MG PO TABS Oral Take 10 mg by mouth daily.     Marland Kitchen BENAZEPRIL HCL 40 MG PO TABS Oral Take 40 mg by mouth 2 (two) times daily.      . CYCLOBENZAPRINE HCL 10 MG PO TABS Oral Take 10 mg by mouth 3 (three) times daily as needed.    Marland Kitchen FLUOXETINE HCL 20 MG PO CAPS Oral Take 20 mg by mouth daily.      Marland Kitchen HYDROCODONE-ACETAMINOPHEN 5-500 MG PO TABS Oral Take 1-2 tablets by mouth every 6 (six) hours as needed for pain. 15 tablet 0  . IBUPROFEN 800 MG PO  TABS Oral Take 1,600 mg by mouth 3 (three) times daily as needed. For pain    . LABETALOL HCL 200 MG PO TABS Oral Take 1 tablet (200 mg total) by mouth 2 (two) times daily. 60 tablet 11  . NAPROXEN 500 MG PO TABS Oral Take 1 tablet (500 mg total) by mouth 2 (two) times daily with a meal. 30 tablet 0  . SPIRONOLACTONE 25 MG PO TABS Oral Take 1 tablet (25 mg total) by mouth daily. 30 tablet 11    BP 221/128  Pulse 101  Temp 98.8 F (37.1 C) (Oral)  Resp 20  Ht 5\' 5"  (1.651 m)  Wt 220 lb (99.791 kg)  BMI 36.61 kg/m2  SpO2 98%  Physical Exam  Constitutional: He is oriented to person, place, and time. He appears well-developed and well-nourished.  HENT:  Head: Normocephalic and atraumatic.  Eyes: EOM are  normal. Pupils are equal, round, and reactive to light.  Neck: Neck supple. No tracheal deviation present.  Cardiovascular: Normal rate and normal heart sounds.        Radial pulses are equal.  Pulmonary/Chest: Effort normal and breath sounds normal. No stridor. No respiratory distress. He has no wheezes. He has no rales. He exhibits no tenderness.  Abdominal: Soft. Bowel sounds are normal. He exhibits no distension. There is no tenderness.  Musculoskeletal: Normal range of motion. He exhibits no edema and no tenderness.       Calves are nontender. Legs are negative for homans.   Neurological: He is alert and oriented to person, place, and time.  Skin: Skin is warm and dry.    ED Course  Procedures (including critical care time)  DIAGNOSTIC STUDIES: Oxygen Saturation is 98% on room air, normal by my interpretation.    COORDINATION OF CARE:  23:15- Discussed planned course of treatment with the patient, including aspirin and blood work, who is agreeable at this time.  23:30- Medication Orders- asprin chewable tablet 324 mg- Once.  Results for orders placed during the hospital encounter of 10/08/12  CBC      Component Value Range   WBC 6.9  4.0 - 10.5 K/uL   RBC 4.71  4.22 - 5.81 MIL/uL   Hemoglobin 15.8  13.0 - 17.0 g/dL   HCT 29.5  62.1 - 30.8 %   MCV 95.3  78.0 - 100.0 fL   MCH 33.5  26.0 - 34.0 pg   MCHC 35.2  30.0 - 36.0 g/dL   RDW 65.7  84.6 - 96.2 %   Platelets 192  150 - 400 K/uL  BASIC METABOLIC PANEL      Component Value Range   Sodium 137  135 - 145 mEq/L   Potassium 3.3 (*) 3.5 - 5.1 mEq/L   Chloride 101  96 - 112 mEq/L   CO2 24  19 - 32 mEq/L   Glucose, Bld 129 (*) 70 - 99 mg/dL   BUN 12  6 - 23 mg/dL   Creatinine, Ser 9.52  0.50 - 1.35 mg/dL   Calcium 9.5  8.4 - 84.1 mg/dL   GFR calc non Af Amer 82 (*) >90 mL/min   GFR calc Af Amer >90  >90 mL/min  PRO B NATRIURETIC PEPTIDE      Component Value Range   Pro B Natriuretic peptide (BNP) 85.3  0 - 125 pg/mL    TROPONIN I      Component Value Range   Troponin I <0.30  <0.30 ng/mL  POCT I-STAT TROPONIN I  Component Value Range   Troponin i, poc 0.10 (*) 0.00 - 0.08 ng/mL   Comment NOTIFIED PHYSICIAN     Comment 3            Dg Chest Portable 1 View  10/08/2012  *RADIOLOGY REPORT*  Clinical Data: Chest pain.  PORTABLE CHEST - 1 VIEW  Comparison: Chest 07/11/2012.  Findings: There is marked cardiomegaly without edema.  Lungs clear. No pneumothorax pleural fluid.  IMPRESSION: Cardiomegaly without acute disease.   Original Report Authenticated By: Holley Dexter, M.D.     Date: 10/09/2012  Rate: 100  Rhythm: normal sinus rhythm  QRS Axis: normal  Intervals: normal  ST/T Wave abnormalities: nonspecific ST/T changes  Conduction Disutrbances:none  Narrative Interpretation: LVH with old T wave inversions and ST depressions inf and lat leads  Old EKG Reviewed: changes noted     CRITICAL CARE Performed by: Hagen Bohorquez   Total critical care time: 35  Critical care time was exclusive of separately billable procedures and treating other patients.  Critical care was necessary to treat or prevent imminent or life-threatening deterioration.  Critical care was time spent personally by me on the following activities: development of treatment plan with patient and/or surrogate as well as nursing, discussions with consultants, evaluation of patient's response to treatment, examination of patient, obtaining history from patient or surrogate, ordering and performing treatments and interventions, ordering and review of laboratory studies, ordering and review of radiographic studies, pulse oximetry and re-evaluation of patient's condition. Dx HTN Urgency with active CP and ECG changes. Serial assessments for active CP improving with IV narcotics and NTG.   NTG IV drip for pain and BP control requiring ICU vs step down ICU.    12:11 AM d/w Dr Orvan Falconer, plan Labetalol drip and admit. Step down admit to  Dr Felecia Shelling   MDM  HTN urgency with CP and ECG changes, evaluated with CXR and labs and started IV nitro for symptoms and BP control, switched to Labetalol drip and admitted by Dr Orvan Falconer   I personally performed the services described in this documentation, which was scribed in my presence. The recorded information has been reviewed and considered.         Sunnie Nielsen, MD 10/09/12 0330

## 2012-10-08 NOTE — ED Notes (Signed)
Pt reports a burning to his chest & back. Pt very anxious at this time. Pt has not had his blood pressure meds in the last 3 to 4 days due to running out.

## 2012-10-08 NOTE — ED Notes (Signed)
Patient c/o mid chest pain that he states is burning.  Patient also c/o upper back pain.

## 2012-10-09 ENCOUNTER — Encounter (HOSPITAL_COMMUNITY): Payer: Self-pay | Admitting: Internal Medicine

## 2012-10-09 DIAGNOSIS — K219 Gastro-esophageal reflux disease without esophagitis: Secondary | ICD-10-CM

## 2012-10-09 DIAGNOSIS — Z720 Tobacco use: Secondary | ICD-10-CM | POA: Diagnosis present

## 2012-10-09 DIAGNOSIS — R079 Chest pain, unspecified: Secondary | ICD-10-CM

## 2012-10-09 DIAGNOSIS — M549 Dorsalgia, unspecified: Secondary | ICD-10-CM

## 2012-10-09 DIAGNOSIS — G8929 Other chronic pain: Secondary | ICD-10-CM | POA: Diagnosis present

## 2012-10-09 DIAGNOSIS — I10 Essential (primary) hypertension: Secondary | ICD-10-CM

## 2012-10-09 LAB — CBC
Hemoglobin: 14 g/dL (ref 13.0–17.0)
MCH: 32.8 pg (ref 26.0–34.0)
MCHC: 34 g/dL (ref 30.0–36.0)
Platelets: 184 10*3/uL (ref 150–400)

## 2012-10-09 LAB — BASIC METABOLIC PANEL
BUN: 13 mg/dL (ref 6–23)
Calcium: 8.7 mg/dL (ref 8.4–10.5)
GFR calc Af Amer: 88 mL/min — ABNORMAL LOW (ref >90)
GFR calc non Af Amer: 76 mL/min — ABNORMAL LOW (ref >90)
Glucose, Bld: 97 mg/dL (ref 70–99)
Potassium: 3.6 mEq/L (ref 3.5–5.1)
Sodium: 139 mEq/L (ref 135–145)

## 2012-10-09 LAB — TROPONIN I: Troponin I: 0.67 ng/mL (ref <0.30)

## 2012-10-09 LAB — APTT: aPTT: 32 seconds (ref 24–37)

## 2012-10-09 LAB — MRSA PCR SCREENING: MRSA by PCR: NEGATIVE

## 2012-10-09 LAB — PROTIME-INR: Prothrombin Time: 12.8 seconds (ref 11.6–15.2)

## 2012-10-09 MED ORDER — LABETALOL HCL 5 MG/ML IV SOLN
0.5000 mg/min | INTRAVENOUS | Status: DC
  Administered 2012-10-09: 0.5 mg/min via INTRAVENOUS
  Filled 2012-10-09: qty 100

## 2012-10-09 MED ORDER — ONDANSETRON HCL 4 MG/2ML IJ SOLN: 4.0000 mg | Freq: Three times a day (TID) | INTRAMUSCULAR | Status: DC | PRN

## 2012-10-09 MED ORDER — PANTOPRAZOLE SODIUM 40 MG IV SOLR
40.0000 mg | INTRAVENOUS | Status: DC
  Administered 2012-10-09 (×2): 40 mg via INTRAVENOUS
  Filled 2012-10-09 (×2): qty 40

## 2012-10-09 MED ORDER — HYDROMORPHONE HCL PF 1 MG/ML IJ SOLN: 1.0000 mg | INTRAMUSCULAR | Status: DC | PRN

## 2012-10-09 MED ORDER — ACETAMINOPHEN 325 MG PO TABS: 650.0000 mg | ORAL_TABLET | ORAL | Status: DC | PRN

## 2012-10-09 MED ORDER — NICOTINE 21 MG/24HR TD PT24
21.0000 mg | MEDICATED_PATCH | Freq: Every day | TRANSDERMAL | Status: DC
  Administered 2012-10-09 (×2): 21 mg via TRANSDERMAL
  Filled 2012-10-09 (×2): qty 1

## 2012-10-09 MED ORDER — BENAZEPRIL HCL 10 MG PO TABS
40.0000 mg | ORAL_TABLET | Freq: Two times a day (BID) | ORAL | Status: DC
  Administered 2012-10-09: 40 mg via ORAL
  Filled 2012-10-09: qty 4

## 2012-10-09 MED ORDER — SPIRONOLACTONE 25 MG PO TABS
25.0000 mg | ORAL_TABLET | Freq: Every day | ORAL | Status: DC
  Administered 2012-10-09: 25 mg via ORAL
  Filled 2012-10-09: qty 1

## 2012-10-09 MED ORDER — ONDANSETRON HCL 4 MG/2ML IJ SOLN: 4.0000 mg | INTRAMUSCULAR | Status: DC | PRN

## 2012-10-09 MED ORDER — TRAZODONE HCL 50 MG PO TABS: 25.0000 mg | ORAL_TABLET | Freq: Every evening | ORAL | Status: DC | PRN

## 2012-10-09 MED ORDER — ASPIRIN EC 81 MG PO TBEC
81.0000 mg | DELAYED_RELEASE_TABLET | Freq: Every day | ORAL | Status: DC
  Administered 2012-10-09: 81 mg via ORAL
  Filled 2012-10-09: qty 1

## 2012-10-09 MED ORDER — OXYCODONE HCL 5 MG PO TABS
5.0000 mg | ORAL_TABLET | ORAL | Status: DC | PRN
  Administered 2012-10-09: 5 mg via ORAL
  Filled 2012-10-09: qty 1

## 2012-10-09 MED ORDER — LABETALOL HCL 5 MG/ML IV SOLN
INTRAVENOUS | Status: AC
  Filled 2012-10-09: qty 12

## 2012-10-09 MED ORDER — HYDROMORPHONE HCL PF 1 MG/ML IJ SOLN
0.5000 mg | INTRAMUSCULAR | Status: DC | PRN
  Administered 2012-10-09 (×2): 1 mg via INTRAVENOUS
  Filled 2012-10-09 (×2): qty 1

## 2012-10-09 MED ORDER — FLEET ENEMA 7-19 GM/118ML RE ENEM: 1.0000 | ENEMA | Freq: Once | RECTAL | Status: DC | PRN

## 2012-10-09 MED ORDER — BISACODYL 5 MG PO TBEC: 5.0000 mg | DELAYED_RELEASE_TABLET | Freq: Every day | ORAL | Status: DC | PRN

## 2012-10-09 MED ORDER — INFLUENZA VIRUS VACC SPLIT PF IM SUSP
0.5000 mL | Freq: Once | INTRAMUSCULAR | Status: AC
  Administered 2012-10-09: 0.5 mL via INTRAMUSCULAR
  Filled 2012-10-09: qty 0.5

## 2012-10-09 MED ORDER — AMLODIPINE BESYLATE 5 MG PO TABS
10.0000 mg | ORAL_TABLET | Freq: Every day | ORAL | Status: DC
  Administered 2012-10-09: 10 mg via ORAL
  Filled 2012-10-09: qty 2

## 2012-10-09 MED ORDER — PNEUMOCOCCAL VAC POLYVALENT 25 MCG/0.5ML IJ INJ
0.5000 mL | INJECTION | INTRAMUSCULAR | Status: DC
  Filled 2012-10-09: qty 0.5

## 2012-10-09 MED ORDER — FLUOXETINE HCL 20 MG PO CAPS
20.0000 mg | ORAL_CAPSULE | Freq: Every day | ORAL | Status: DC
  Administered 2012-10-09: 20 mg via ORAL
  Filled 2012-10-09: qty 1

## 2012-10-09 NOTE — ED Notes (Addendum)
CRITICAL VALUE ALERT  Critical value received:  tropinin 0.34  Date of notification:  10/09/12  Time of notification:  0220  Critical value read back:yes  Nurse who received alert:  Lorriane Shire, RN  MD notified (1st page):  campbell  Time of first page:  0222  MD notified (2nd page):  Time of second page:  Responding MD:  campbell  Time MD responded:  [phone]

## 2012-10-09 NOTE — Progress Notes (Signed)
DR Felecia Shelling CALLED AND INFORMED THAT PT STATING THAT HE WILL BE LEAVING AMA. DR Felecia Shelling STATES THAT THIS HAS HAPPENED BEFORE AND THAT RN IS TO INFORM THE PT THAT DR Felecia Shelling WILL NO LONGER BE HIS PRIMARY CARE PHYSICAN WHEN HE LEAVES THE HOSPITAL.

## 2012-10-09 NOTE — Plan of Care (Signed)
Problem: Consults Goal: Chest Pain Patient Education (See Patient Education module for education specifics.) Outcome: Progressing Pt came to ED for c/o chest &back pain.Pt BP=229/126. Pt was started on NTG drip & then changed to a labatolol drip@0 .16mcg/min with bp129/83

## 2012-10-09 NOTE — Progress Notes (Signed)
PT HEARD RN SPEAKING W/ DR Felecia Shelling. PT GOT DRESSED AND STATED THAT THE STAFF WERE "SNITCHES" AND THAT HE WAS LEAVING AMA NOW. PT SIGNED AMA PAPER AND ALLOWED RN TO REMOVE IV. THEN HE LEFT THE FLOOR.Marland Kitchen BEFORE LEAVING RN TOLD PT THAT DR Felecia Shelling STATED HE WOULD NO LONGER BE HIS PRIMARY DOCTOR. PT SAOD THAT HE UNDERSTOOD

## 2012-10-09 NOTE — ED Notes (Signed)
AC called for medications. 

## 2012-10-09 NOTE — Progress Notes (Signed)
Subjective: Admitted last night due to hypertensive emergency. Patient didn't his medications for 4 days. Claims he didn't had money for copayment to get his medications. He feels better today. No headache or chest pain.  Objective: Vital signs in last 24 hours: Temp:  [98.3 F (36.8 C)-98.8 F (37.1 C)] 98.3 F (36.8 C) (11/01 0800) Pulse Rate:  [74-101] 75  (11/01 0900) Resp:  [14-20] 17  (11/01 0900) BP: (113-221)/(62-128) 123/77 mmHg (11/01 0900) SpO2:  [91 %-100 %] 92 % (11/01 0900) Weight:  [97.2 kg (214 lb 4.6 oz)-99.791 kg (220 lb)] 97.2 kg (214 lb 4.6 oz) (11/01 0300) Weight change:  Last BM Date: 10/08/12  Intake/Output from previous day: 10/31 0701 - 11/01 0700 In: 40.8 [I.V.:40.8] Out: -   PHYSICAL EXAM General appearance: alert and no distress Resp: clear to auscultation bilaterally Cardio: regular rate and rhythm, S1, S2 normal, no murmur, click, rub or gallop GI: soft, non-tender; bowel sounds normal; no masses,  no organomegaly Extremities: extremities normal, atraumatic, no cyanosis or edema  Lab Results:    @labtest @ [facility] No results found for this basename: PHART,PCO2,PO2ART,TCO2,HCO3 in the last 72 hours CULTURES Recent Results (from the past 240 hour(s))  MRSA PCR SCREENING     Status: Normal   Collection Time   10/09/12  3:21 AM      Component Value Range Status Comment   MRSA by PCR NEGATIVE  NEGATIVE Final    Studies/Results: Dg Chest Portable 1 View  10/08/2012  *RADIOLOGY REPORT*  Clinical Data: Chest pain.  PORTABLE CHEST - 1 VIEW  Comparison: Chest 07/11/2012.  Findings: There is marked cardiomegaly without edema.  Lungs clear. No pneumothorax pleural fluid.  IMPRESSION: Cardiomegaly without acute disease.   Original Report Authenticated By: Holley Dexter, M.D.     Medications: I have reviewed the patient's current medications.  Assesment: Active Problems:  Accelerated hypertension  Chest pain  GERD (gastroesophageal reflux  disease)  Chronic back pain  Tobacco abuse    Plan: We will taper and D/c IV labetalol Continue patient on oral antihypertensives Continue tel metry We will continue to monitor B/P    LOS: 1 day   Troyce Febo 10/09/2012, 10:29 AM

## 2012-10-09 NOTE — ED Notes (Signed)
Pt remains on cardiac monitor w/ NIBP vital signs WNL. NAD noted. No needs voiced at this time. 

## 2012-10-09 NOTE — Plan of Care (Signed)
Problem: Phase I Progression Outcomes Goal: Hemodynamically stable Outcome: Not Progressing Pt BP  Is normal with labetalol drip. Pt cannot afford RX's Goal: Anginal pain relieved Outcome: Progressing No  C/O any chest pain since arrival to ICU. Pt did receive prn dilaudid for c/o #5 burning back pain Goal: MD aware of Cardiac Marker results Outcome: Progressing Troponin @ 0500=0.67 Dr Orvan Falconer updated

## 2012-10-09 NOTE — Plan of Care (Signed)
Problem: Consults Goal: Tobacco Cessation referral if indicated Outcome: Not Progressing Pt has smoked for 25 years about 1/2 ppd. He has a nicotine patch. He would benefit greatly from this referral

## 2012-10-09 NOTE — Progress Notes (Signed)
PT STATING HE  NEEDS  TO LEAVE  BEFORE 1400 D/T TO FAMILY OBLIGATION.  RN INFORMED PT THAT HIS CONDITION WAS VERY SERIOUS AND THE DRS WOULD NOT DISCHARGE HIM AT THIS TIME. PT STATED THAT HE KNEW HE WOULD BE LEAVING AMA BEFORE 1400. PT COUNSELED ON IMPORTANCE OF STABLIZING BP AND TAKING BP MEDS AS PRESCRIBED TO PREVENT STROKE OR FUTHER HEART DAMAGE. CONSELED ON IMPORTANCE OF MD FOLLOW UP AND HEART HEALTHY DIET.  IV LABETALOL OFF AT THIS TIME AND PO ANTIHYPERTENSIVE MEDS GIVEN. NSL REMAINS IN PLACE IN CASE IV LABETALOL NEEDS TO BE RESTARTED.

## 2012-10-09 NOTE — Progress Notes (Signed)
UR Chart Review Completed  

## 2012-10-09 NOTE — ED Notes (Signed)
Attempted to call report. Was advised nurse receiving pt will call this nurse back for report. 

## 2012-10-09 NOTE — H&P (Signed)
Taylor Obrien History and Physical  Taylor Obrien  Taylor Obrien:[id number]  DOB: 1966/10/10   DOA: 10/09/2012   PCP:   Taylor Gully, MD   Chief Complaint:  Chest pain and headaches since t today  HPI: Taylor Obrien is an 46 y.o. male.   Obese African American gentleman with a history of stage III hypertension noncompliant with medical therapy, presents to the emergency room with the above symptoms. He describes the pain as burning associated with a numbness of his right arm, and headache. He also reports a pain and tenderness in his central back which she says is been going on for some time., And is also associated with pain across both shoulders. He denies nausea vomiting or fever.  In the emergency room his blood pressure was found to to be 220/130 and the hospitalist service was called to assist with management  After some months patient was eventually convinced to see a cardiologist one month ago and an echo at that time showed hypertensive cardiomyopathy without any wall motion abnormality; ejection fraction was 65%  This gentleman does have a history of tobacco and marijuana use. He does have a history of depression  Rewiew of Systems:   All systems negative except as marked bold or noted in the HPI;  Constitutional: Negative for malaise, fever and chills. ;  Eyes: Negative for eye pain, redness and discharge. ;  ENMT: Negative for ear pain, hoarseness, nasal congestion, sinus pressure and sore throat. ;  Respiratory: Negative for cough, hemoptysis, wheezing and stridor. ;  Gastrointestinal: Negative for nausea, vomiting, diarrhea, constipation, abdominal pain, melena, blood in stool, hematemesis, jaundice and rectal bleeding. unusual weight loss..   Genitourinary: Negative for frequency, dysuria, incontinence,flank pain and hematuria; Musculoskeletal:  Negative for swelling and trauma.;  Skin: . Negative for pruritus, rash, abrasions, bruising and skin lesion.; ulcerations Neuro:  Negative for headache, lightheadedness and neck stiffness. Negative for weakness, altered level of consciousness , altered mental status, extremity weakness, burning feet, involuntary movement, seizure and syncope.  Psych: negative for , insomnia, tearfulness, panic attacks, hallucinations, paranoia, suicidal or homicidal ideation   Past Medical History  Diagnosis Date  . Essential hypertension, benign   . Hyperlipidemia   . Depression   . Chronic pain   . GERD (gastroesophageal reflux disease)     Past Surgical History  Procedure Date  . Right rotator cuff repair   . Closed reduction mandibular fracture w/ arch bars     + multiple extractions  . Removal foreign body right shoulder   . Condyloma resection     Medications:  HOME MEDS: Prior to Admission medications   Medication Sig Start Date End Date Taking? Authorizing Provider  amLODipine (NORVASC) 10 MG tablet Take 10 mg by mouth daily.     Historical Provider, MD  benazepril (LOTENSIN) 40 MG tablet Take 40 mg by mouth 2 (two) times daily.      Historical Provider, MD  cyclobenzaprine (FLEXERIL) 10 MG tablet Take 10 mg by mouth 3 (three) times daily as needed.    Historical Provider, MD  FLUoxetine (PROZAC) 20 MG capsule Take 20 mg by mouth daily.      Historical Provider, MD  HYDROcodone-acetaminophen (VICODIN) 5-500 MG per tablet Take 1-2 tablets by mouth every 6 (six) hours as needed for pain. 08/25/12   [name], MD  ibuprofen (ADVIL,MOTRIN) 800 MG tablet Take 1,600 mg by mouth 3 (three) times daily as needed. For pain    Historical Provider, MD  labetalol (  NORMODYNE) 200 MG tablet Take 1 tablet (200 mg total) by mouth 2 (two) times daily. 08/31/12   [name], MD  naproxen (NAPROSYN) 500 MG tablet Take 1 tablet (500 mg total) by mouth 2 (two) times daily with a meal. 08/25/12   [name], MD  spironolactone (ALDACTONE) 25 MG tablet Take 1 tablet (25 mg total) by mouth daily. 08/31/12   [name], MD       Allergies:  Allergies  Allergen Reactions  . Oxcarbazepine Other (See Comments)    Patient goes out of right state of mind.     Social History:   reports that he has been smoking Cigarettes.  He has a 15 pack-year smoking history. He has never used smokeless tobacco. He reports that he drinks alcohol. He reports that he uses illicit drugs (Marijuana).  Family History: Family History  Problem Relation Age of Onset  . Diabetes Mother   . Hypertension Mother   . Diabetes Father   . Hypertension Father   . Diabetes Brother   . Hypertension Brother      Physical Exam: Filed Vitals:   10/08/12 2243 10/08/12 2340 10/09/12 0001 10/09/12 0041  BP: 221/128 181/101 171/98 165/94  Pulse: 101 97 91 93  Temp: 98.8 F (37.1 C)     TempSrc: Oral     Resp: 20 19 19 18   Height: 5\' 5"  (1.651 m)     Weight: 99.791 kg (220 lb)     SpO2: 98% 100% 93% 94%   Blood pressure 165/94, pulse 93, temperature 98.8 F (37.1 C), temperature source Oral, resp. rate 18, height 5\' 5"  (1.651 m), weight 99.791 kg (220 lb), SpO2 94.00%.  GEN:  Pleasant obese African American gentleman lying in the stretcher; cooperative with exam PSYCH:  alert and oriented x4; appears somewhat anxious;  HEENT: Mucous membranes pink and anicteric; PERRLA; EOM intact; thick neck or carotid bruit; no JVD; Breasts:: Not examined CHEST WALL: No tenderness CHEST: Normal respiration, occasional rhonchi HEART: Regular rate and rhythm; S4 gallop; 3/6 early systolic murmur BACK: No kyphosis or scoliosis; tender over the lower thoracic spine ABDOMEN: Obese, soft non-tender; no masses, no organomegaly, normal abdominal bowel sounds;  no intertriginous candida. Rectal Exam: Not done EXTREMITIES: ; age-appropriate arthropathy of the hands and knees; no edema; no ulcerations. Genitalia: not examined PULSES: 2+ and symmetric SKIN: Normal hydration no rash or ulceration CNS: Cranial nerves 2-12 grossly intact no focal  lateralizing neurologic deficit   Labs on Admission:  Basic Metabolic Panel:  Lab 10/08/12 4098  NA 137  K 3.3*  CL 101  CO2 24  GLUCOSE 129*  BUN 12  CREATININE 1.08  CALCIUM 9.5  MG --  PHOS --   Liver Function Tests: No results found for this basename: AST:5,ALT:5,ALKPHOS:5,BILITOT:5,PROT:5,ALBUMIN:5 in the last 168 hours No results found for this basename: LIPASE:5,AMYLASE:5 in the last 168 hours No results found for this basename: AMMONIA:5 in the last 168 hours CBC:  Lab 10/08/12 2247  WBC 6.9  NEUTROABS --  HGB 15.8  HCT 44.9  MCV 95.3  PLT 192   Cardiac Enzymes:  Lab 10/08/12 2247  CKTOTAL --  CKMB --  CKMBINDEX --  TROPONINI <0.30   BNP: No components found with this basename: POCBNP:5 D-dimer: No components found with this basename: D-DIMER:5 CBG: No results found for this basename: GLUCAP:5 in the last 168 hours  Radiological Exams on Admission: Dg Chest Portable 1 View  10/08/2012  *RADIOLOGY REPORT*  Clinical  Data: Chest pain.  PORTABLE CHEST - 1 VIEW  Comparison: Chest 07/11/2012.  Findings: There is marked cardiomegaly without edema.  Lungs clear. No pneumothorax pleural fluid.  IMPRESSION: Cardiomegaly without acute disease.   Original Report Authenticated By: Holley Dexter, M.D.     EKG: Independently reviewed. Sinus tachycardia   Assessment/Plan Present on Admission:  .Accelerated hypertension .Chest pain, likely secondary to the above  .GERD (gastroesophageal reflux disease), a possible cause of chest pain in the setting of end-stage use  .Chronic back pain .Tobacco abuse Marijuana abuse Depression  PLAN: We'll admit this gentleman to the step down unit and place him on a labetalol drip to slowly titrate his blood pressure down.  We'll immediately restart his oral antihypertensive medications, with the exception of his labetalol. Will give her proton pump inhibitors for his GERD at the same time we'll give Dilaudid when  necessary for his pain. Will not use Lovenox for DVT prophylaxis given his markedly elevated blood pressure risk of cerebral bleed, will use SCDs and Ted stocking. Counseling on my on tobacco abuse and often nicotine replacement    Code Status: FULL CODE   Critical care time: 60 minutes.   Rosina Cressler Nocturnist Taylor Obrien Pager [phone]   10/09/2012, 1:10 AM

## 2012-11-17 NOTE — Discharge Summary (Signed)
Physician Discharge Summary  Patient ID: MENACHEM URBANEK MRN: [id number] DOB/AGE: [birth year] 46 y.o. Primary Care Physician:Isobel Eisenhuth, MD Admit date: 10/08/2012 Discharge date: 11/17/2012   Discharge Diagnoses:   Active Problems:  Accelerated hypertension  Chest pain  GERD (gastroesophageal reflux disease)  Chronic back pain  Tobacco abuse     Medication List     As of 11/17/2012  4:25 PM    ASK your doctor about these medications         amLODipine 10 MG tablet   Commonly known as: NORVASC   Take 10 mg by mouth daily.      benazepril 40 MG tablet   Commonly known as: LOTENSIN   Take 40 mg by mouth 2 (two) times daily.      cyclobenzaprine 10 MG tablet   Commonly known as: FLEXERIL   Take 10 mg by mouth 3 (three) times daily as needed.      FLUoxetine 20 MG capsule   Commonly known as: PROZAC   Take 20 mg by mouth daily.      HYDROcodone-acetaminophen 5-500 MG per tablet   Commonly known as: VICODIN   Take 1-2 tablets by mouth every 6 (six) hours as needed for pain.      ibuprofen 800 MG tablet   Commonly known as: ADVIL,MOTRIN   Take 1,600 mg by mouth 3 (three) times daily as needed. For pain      labetalol 200 MG tablet   Commonly known as: NORMODYNE   Take 1 tablet (200 mg total) by mouth 2 (two) times daily.      naproxen 500 MG tablet   Commonly known as: NAPROSYN   Take 1 tablet (500 mg total) by mouth 2 (two) times daily with a meal.      spironolactone 25 MG tablet   Commonly known as: ALDACTONE   Take 1 tablet (25 mg total) by mouth daily.        Discharged Condition:*stable   Consults:*none Significant Diagnostic Studies: No results found.  Lab Results: Basic Metabolic Panel: No results found for this basename: NA:2,K:2,CL:2,CO2:2,GLUCOSE:2,BUN:2,CREATININE:2,CALCIUM:2,MG:2,PHOS:2 in the last 72 hours Liver Function Tests: No results found for this basename: AST:2,ALT:2,ALKPHOS:2,BILITOT:2,PROT:2,ALBUMIN:2 in the last 72  hours   CBC: No results found for this basename: WBC:2,NEUTROABS:2,HGB:2,HCT:2,MCV:2,PLT:2 in the last 72 hours  No results found for this or any previous visit (from the past 240 hour(s)).   Hospital Course: * PATIENT WAS ADMITTED DUE TO ACCELERATED HYPERTENSION. PATIENT WAS ADMITTED UNDER TELEMETRY AND TREATMENT WAS STARTED BUT PATIENT SIGNED OUT AGAINST MEDICAL ADVISE   Discharge Exam: Blood pressure 146/85, pulse 69, temperature 98.3 F (36.8 C), temperature source Oral, resp. rate 14, height 5\' 5"  (1.651 m), weight 97.2 kg (214 lb 4.6 oz), SpO2 97.00%. *\ Disposition: * HOME     Signed: Braeson Rupe   11/17/2012, 4:25 PM

## 2012-11-27 ENCOUNTER — Other Ambulatory Visit (HOSPITAL_COMMUNITY): Payer: Self-pay | Admitting: Internal Medicine

## 2012-11-27 ENCOUNTER — Ambulatory Visit (HOSPITAL_COMMUNITY)
Admission: RE | Admit: 2012-11-27 | Discharge: 2012-11-27 | Disposition: A | Discharge status: Home / Self Care | Payer: Medicaid Other | Source: Ambulatory Visit | Attending: Internal Medicine | Admitting: Internal Medicine

## 2012-11-27 DIAGNOSIS — M549 Dorsalgia, unspecified: Secondary | ICD-10-CM | POA: Insufficient documentation

## 2012-11-27 DIAGNOSIS — R059 Cough, unspecified: Secondary | ICD-10-CM | POA: Insufficient documentation

## 2012-11-27 DIAGNOSIS — J40 Bronchitis, not specified as acute or chronic: Secondary | ICD-10-CM

## 2012-11-27 DIAGNOSIS — R05 Cough: Secondary | ICD-10-CM | POA: Insufficient documentation

## 2013-06-07 ENCOUNTER — Encounter (HOSPITAL_COMMUNITY): Payer: Self-pay | Admitting: *Deleted

## 2013-06-07 ENCOUNTER — Emergency Department (HOSPITAL_COMMUNITY)
Admission: EM | Admit: 2013-06-07 | Discharge: 2013-06-07 | Discharge status: Against Medical Advice | Payer: Medicaid Other | Source: Home / Self Care

## 2013-06-07 ENCOUNTER — Emergency Department (HOSPITAL_COMMUNITY): Payer: Medicaid Other

## 2013-06-07 ENCOUNTER — Emergency Department (HOSPITAL_COMMUNITY)
Admission: EM | Admit: 2013-06-07 | Discharge: 2013-06-07 | Disposition: A | Discharge status: Home / Self Care | Payer: Medicaid Other | Attending: Emergency Medicine | Admitting: Emergency Medicine

## 2013-06-07 DIAGNOSIS — F329 Major depressive disorder, single episode, unspecified: Secondary | ICD-10-CM | POA: Insufficient documentation

## 2013-06-07 DIAGNOSIS — F17200 Nicotine dependence, unspecified, uncomplicated: Secondary | ICD-10-CM | POA: Insufficient documentation

## 2013-06-07 DIAGNOSIS — I10 Essential (primary) hypertension: Secondary | ICD-10-CM | POA: Insufficient documentation

## 2013-06-07 DIAGNOSIS — F3289 Other specified depressive episodes: Secondary | ICD-10-CM | POA: Insufficient documentation

## 2013-06-07 DIAGNOSIS — Z8719 Personal history of other diseases of the digestive system: Secondary | ICD-10-CM | POA: Insufficient documentation

## 2013-06-07 DIAGNOSIS — Z8639 Personal history of other endocrine, nutritional and metabolic disease: Secondary | ICD-10-CM | POA: Insufficient documentation

## 2013-06-07 DIAGNOSIS — Z79899 Other long term (current) drug therapy: Secondary | ICD-10-CM | POA: Insufficient documentation

## 2013-06-07 DIAGNOSIS — Z862 Personal history of diseases of the blood and blood-forming organs and certain disorders involving the immune mechanism: Secondary | ICD-10-CM | POA: Insufficient documentation

## 2013-06-07 DIAGNOSIS — G8929 Other chronic pain: Secondary | ICD-10-CM | POA: Insufficient documentation

## 2013-06-07 LAB — COMPREHENSIVE METABOLIC PANEL
ALT: 58 U/L — ABNORMAL HIGH (ref 0–53)
AST: 69 U/L — ABNORMAL HIGH (ref 0–37)
Albumin: 3.9 g/dL (ref 3.5–5.2)
Alkaline Phosphatase: 56 U/L (ref 39–117)
GFR calc non Af Amer: 78 mL/min — ABNORMAL LOW (ref >90)
Glucose, Bld: 90 mg/dL (ref 70–99)
Total Bilirubin: 0.3 mg/dL (ref 0.3–1.2)

## 2013-06-07 LAB — CBC WITH DIFFERENTIAL/PLATELET
Basophils Relative: 0 % (ref 0–1)
Eosinophils Absolute: 0.1 10*3/uL (ref 0.0–0.7)
Eosinophils Relative: 2 % (ref 0–5)
Hemoglobin: 15.6 g/dL (ref 13.0–17.0)
Lymphs Abs: 1.9 10*3/uL (ref 0.7–4.0)
MCH: 33.9 pg (ref 26.0–34.0)
MCHC: 35.9 g/dL (ref 30.0–36.0)
MCV: 94.3 fL (ref 78.0–100.0)
Monocytes Relative: 8 % (ref 3–12)
RBC: 4.6 MIL/uL (ref 4.22–5.81)

## 2013-06-07 MED ORDER — LABETALOL HCL 5 MG/ML IV SOLN
40.0000 mg | Freq: Once | INTRAVENOUS | Status: AC
  Administered 2013-06-07: 40 mg via INTRAVENOUS
  Filled 2013-06-07: qty 8

## 2013-06-07 MED ORDER — BENAZEPRIL HCL 10 MG PO TABS: 40.0000 mg | ORAL_TABLET | Freq: Every day | ORAL | Status: DC

## 2013-06-07 MED ORDER — HYDROMORPHONE HCL PF 1 MG/ML IJ SOLN
1.0000 mg | Freq: Once | INTRAMUSCULAR | Status: AC
  Administered 2013-06-07: 1 mg via INTRAVENOUS
  Filled 2013-06-07: qty 1

## 2013-06-07 MED ORDER — OXYCODONE-ACETAMINOPHEN 5-325 MG PO TABS
ORAL_TABLET | ORAL | Status: AC
  Administered 2013-06-07: 1 via ORAL
  Filled 2013-06-07: qty 1

## 2013-06-07 MED ORDER — OXYCODONE-ACETAMINOPHEN 5-325 MG PO TABS
1.0000 | ORAL_TABLET | Freq: Once | ORAL | Status: AC
  Administered 2013-06-07: 1 via ORAL

## 2013-06-07 MED ORDER — HYDROCHLOROTHIAZIDE 25 MG PO TABS: 25.0000 mg | ORAL_TABLET | Freq: Every day | ORAL | Status: DC

## 2013-06-07 MED ORDER — CLONIDINE HCL 0.2 MG PO TABS
0.2000 mg | ORAL_TABLET | Freq: Once | ORAL | Status: AC
  Administered 2013-06-07: 0.2 mg via ORAL
  Filled 2013-06-07: qty 1

## 2013-06-07 MED ORDER — HYDROCODONE-ACETAMINOPHEN 5-325 MG PO TABS: 1.0000 | ORAL_TABLET | Freq: Three times a day (TID) | ORAL | Status: DC | PRN

## 2013-06-07 NOTE — ED Provider Notes (Signed)
History     This chart was scribed for Benny Lennert, MD, MD by Smitty Pluck, ED Scribe. The patient was seen in room APA15/[facility]15 and the patient's care was started at 8:42PM.  CSN: [id number] Arrival date & time 06/07/13  1956    Chief Complaint  Patient presents with  . Headache    Patient is a 47 y.o. male presenting with headaches. The history is provided by the patient and medical records. No language interpreter was used.  Headache Pain location:  Occipital Quality:  Unable to specify Radiates to: posterior neck. Severity currently:  Unable to specify Severity at highest:  Unable to specify Duration:  1 day Timing:  Constant Chronicity:  Recurrent Similar to prior headaches: yes   Relieved by:  None tried Ineffective treatments:  None tried Associated symptoms: no abdominal pain, no back pain, no congestion, no cough, no diarrhea, no fatigue, no seizures and no sinus pressure    HPI Comments: Taylor Obrien is a 47 y.o. male who presents to the Emergency Department complaining of constant, moderate occipital HA onset 1 day ago with pain radiating to posterior neck. Pt reports having elevated BP at home 229/147. Pt states that he has not taken his HTN medication within the past week. Pt denies fever, chills, nausea, vomiting, diarrhea, weakness, cough, SOB and any other pain.  PCP  Dr Agustin Cree  Past Medical History  Diagnosis Date  . Essential hypertension, benign   . Hyperlipidemia   . Depression   . Chronic pain   . GERD (gastroesophageal reflux disease)    Past Surgical History  Procedure Laterality Date  . Right rotator cuff repair    . Closed reduction mandibular fracture w/ arch bars      + multiple extractions  . Removal foreign body right shoulder    . Condyloma resection     Family History  Problem Relation Age of Onset  . Diabetes Mother   . Hypertension Mother   . Diabetes Father   . Hypertension Father   . Diabetes Brother   . Hypertension  Brother    History  Substance Use Topics  . Smoking status: Current Every Day Smoker -- 0.50 packs/day for 30 years    Types: Cigarettes  . Smokeless tobacco: Never Used  . Alcohol Use: 0.0 oz/week     Comment: daily    Review of Systems  Constitutional: Negative for appetite change and fatigue.  HENT: Negative for congestion, sinus pressure and ear discharge.   Eyes: Negative for discharge.  Respiratory: Negative for cough.   Cardiovascular: Negative for chest pain.  Gastrointestinal: Negative for abdominal pain and diarrhea.  Genitourinary: Negative for frequency and hematuria.  Musculoskeletal: Negative for back pain.  Skin: Negative for rash.  Neurological: Positive for headaches. Negative for seizures.  Psychiatric/Behavioral: Negative for hallucinations.    Allergies  Oxcarbazepine  Home Medications   Current Outpatient Rx  Name  Route  Sig  Dispense  Refill  . acetaminophen (TYLENOL) 500 MG tablet   Oral   Take 1,000 mg by mouth every 6 (six) hours as needed for pain.         Marland Kitchen amLODipine (NORVASC) 10 MG tablet   Oral   Take 10 mg by mouth daily.          . benazepril (LOTENSIN) 40 MG tablet   Oral   Take 40 mg by mouth 2 (two) times daily.           . cyclobenzaprine (  FLEXERIL) 10 MG tablet   Oral   Take 10 mg by mouth 3 (three) times daily as needed.         Marland Kitchen FLUoxetine (PROZAC) 20 MG capsule   Oral   Take 20 mg by mouth daily.            BP 200/127  Pulse 95  Temp(Src) 97.9 F (36.6 C) (Oral)  Resp 20  Ht 5\' 5"  (1.651 m)  Wt 220 lb (99.791 kg)  BMI 36.61 kg/m2  SpO2 100% Physical Exam  Nursing note and vitals reviewed. Constitutional: He is oriented to person, place, and time. He appears well-developed.  HENT:  Head: Normocephalic.  Eyes: EOM are normal. No scleral icterus.  Both eyes are hyperemic    Neck: Neck supple. No thyromegaly present.  Cardiovascular: Normal rate and regular rhythm.  Exam reveals no gallop and no  friction rub.   No murmur heard. Pulmonary/Chest: No stridor. He has no wheezes. He has no rales. He exhibits no tenderness.  Abdominal: He exhibits no distension. There is no tenderness. There is no rebound.  Musculoskeletal: Normal range of motion. He exhibits no edema.  Lymphadenopathy:    He has no cervical adenopathy.  Neurological: He is oriented to person, place, and time. Coordination normal.  Skin: No rash noted. No erythema.  Psychiatric: He has a normal mood and affect. His behavior is normal.    ED Course  Procedures (including critical care time) DIAGNOSTIC STUDIES: Oxygen Saturation is 100% on room air, normal by my interpretation.    COORDINATION OF CARE: 8:46 PM Discussed ED treatment with pt and pt agrees.  8:49 PM Ordered:  Medications  cloNIDine (CATAPRES) tablet 0.2 mg (0.2 mg Oral Given 06/07/13 2052)  HYDROmorphone (DILAUDID) injection 1 mg (1 mg Intravenous Given 06/07/13 2052)  labetalol (NORMODYNE,TRANDATE) injection 40 mg (40 mg Intravenous Given 06/07/13 2214)       Labs Reviewed  COMPREHENSIVE METABOLIC PANEL - Abnormal; Notable for the following:    AST 69 (*)    ALT 58 (*)    GFR calc non Af Amer 78 (*)    All other components within normal limits  CBC WITH DIFFERENTIAL   Ct Head Wo Contrast  06/07/2013   *RADIOLOGY REPORT*  Clinical Data: Occipital headache for 1 day.  Posterior neck pain.  CT HEAD WITHOUT CONTRAST  Technique:  Contiguous axial images were obtained from the base of the skull through the vertex without contrast.  Comparison: None.  Findings: No mass lesion, mass effect, midline shift, hydrocephalus, hemorrhage.  No territorial ischemia or acute infarction.  Calvarium intact.  Paranasal sinuses are within normal limits.  IMPRESSION: Negative CT head.   Original Report Authenticated By: Andreas Newport, M.D.   No diagnosis found.  MDM    .   Benny Lennert, MD 06/07/13 [phone]

## 2013-06-07 NOTE — ED Notes (Signed)
Been out of bp meds over 1 wk - woke up this morning, checked bp and was 199 systolic.  C/o visual disturbances, back pain and neck pain .

## 2013-06-07 NOTE — ED Notes (Signed)
Headache, and bp up,when checked at home.  Has not taken his bp med in over 1 week,

## 2013-06-07 NOTE — ED Notes (Signed)
Pt stopped while waking up the hallway. Pt stated he was leaving because "It is taking too long". PT signed AMA form, has not been seen by the physician. Pt told the risks of leaving could include worsening of condition or death. Pt stated, "It is what it is" and ambulated out.

## 2013-07-30 ENCOUNTER — Emergency Department (HOSPITAL_COMMUNITY)
Admission: EM | Admit: 2013-07-30 | Discharge: 2013-07-30 | Disposition: A | Discharge status: Home / Self Care | Payer: Medicaid Other | Attending: Emergency Medicine | Admitting: Emergency Medicine

## 2013-07-30 ENCOUNTER — Encounter (HOSPITAL_COMMUNITY): Payer: Self-pay | Admitting: *Deleted

## 2013-07-30 DIAGNOSIS — M545 Low back pain, unspecified: Secondary | ICD-10-CM | POA: Insufficient documentation

## 2013-07-30 DIAGNOSIS — G8929 Other chronic pain: Secondary | ICD-10-CM

## 2013-07-30 DIAGNOSIS — Z8719 Personal history of other diseases of the digestive system: Secondary | ICD-10-CM | POA: Insufficient documentation

## 2013-07-30 DIAGNOSIS — Z79899 Other long term (current) drug therapy: Secondary | ICD-10-CM | POA: Insufficient documentation

## 2013-07-30 DIAGNOSIS — F17200 Nicotine dependence, unspecified, uncomplicated: Secondary | ICD-10-CM | POA: Insufficient documentation

## 2013-07-30 DIAGNOSIS — Z862 Personal history of diseases of the blood and blood-forming organs and certain disorders involving the immune mechanism: Secondary | ICD-10-CM | POA: Insufficient documentation

## 2013-07-30 DIAGNOSIS — Z8659 Personal history of other mental and behavioral disorders: Secondary | ICD-10-CM | POA: Insufficient documentation

## 2013-07-30 DIAGNOSIS — I10 Essential (primary) hypertension: Secondary | ICD-10-CM | POA: Insufficient documentation

## 2013-07-30 DIAGNOSIS — Z8639 Personal history of other endocrine, nutritional and metabolic disease: Secondary | ICD-10-CM | POA: Insufficient documentation

## 2013-07-30 MED ORDER — OXYCODONE-ACETAMINOPHEN 5-325 MG PO TABS: 1.0000 | ORAL_TABLET | ORAL | Status: DC | PRN

## 2013-07-30 MED ORDER — LABETALOL HCL 200 MG PO TABS: 200.0000 mg | ORAL_TABLET | Freq: Two times a day (BID) | ORAL | Status: DC

## 2013-07-30 MED ORDER — OXYCODONE-ACETAMINOPHEN 5-325 MG PO TABS
2.0000 | ORAL_TABLET | Freq: Once | ORAL | Status: AC
  Administered 2013-07-30: 2 via ORAL
  Filled 2013-07-30: qty 2

## 2013-07-30 MED ORDER — LABETALOL HCL 100 MG PO TABS: 200.0000 mg | ORAL_TABLET | Freq: Two times a day (BID) | ORAL | Status: DC

## 2013-07-30 NOTE — ED Notes (Signed)
Chronic back pain, worsening today.  Denies new injury.

## 2013-07-30 NOTE — ED Notes (Signed)
Pt says he has chronic back pain from his neck down.  Says his pain pills are not helping.  No recent injury.  Alert, says he has been taking his pt meds

## 2013-07-30 NOTE — ED Provider Notes (Signed)
CSN: [id number]     Arrival date & time 07/30/13  1059 History     First MD Initiated Contact with Patient 07/30/13 1121     Chief Complaint  Patient presents with  . Back Pain   (Consider location/radiation/quality/duration/timing/severity/associated sxs/prior Treatment) HPI Comments: ALPHONSA BRICKLE is a 47 y.o. Male presenting with acute on chronic low back pain which has which has been worsened  for the past several days.   Patient denies any new injury specifically.  There is no  radiation into his lower extremities.  There has been no weakness or numbness in the lower extremities, no abdominal pain and no urinary or bowel retention or incontinence.  Patient does not have a history of cancer or IVDU.  His pain is improved with leaning forward, worse with attempts to stand straight.  He is taking ibuprofen at home without relief.  His blood pressure is very elevated today, yet he denies symptoms including headache, visual changes, chest pain or shortness of breath.  He is taking his bp medicines which include norvasc, lotensin and hctz.  And reports took his medicines this morning.        The history is provided by the patient.    Past Medical History  Diagnosis Date  . Essential hypertension, benign   . Hyperlipidemia   . Depression   . Chronic pain   . GERD (gastroesophageal reflux disease)    Past Surgical History  Procedure Laterality Date  . Right rotator cuff repair    . Closed reduction mandibular fracture w/ arch bars      + multiple extractions  . Removal foreign body right shoulder    . Condyloma resection     Family History  Problem Relation Age of Onset  . Diabetes Mother   . Hypertension Mother   . Diabetes Father   . Hypertension Father   . Diabetes Brother   . Hypertension Brother    History  Substance Use Topics  . Smoking status: Current Every Day Smoker -- 0.50 packs/day for 30 years    Types: Cigarettes  . Smokeless tobacco: Never Used  . Alcohol  Use: 0.0 oz/week     Comment: daily    Review of Systems  Constitutional: Negative for fever.  HENT: Negative for congestion, sore throat and neck pain.   Eyes: Negative.  Negative for visual disturbance.  Respiratory: Negative for chest tightness and shortness of breath.   Cardiovascular: Negative for chest pain and leg swelling.  Gastrointestinal: Negative for nausea, abdominal pain, constipation and abdominal distention.  Genitourinary: Negative.  Negative for dysuria, urgency, frequency, flank pain and difficulty urinating.  Musculoskeletal: Positive for back pain. Negative for joint swelling, arthralgias and gait problem.  Skin: Negative.  Negative for rash and wound.  Neurological: Negative for dizziness, weakness, light-headedness, numbness and headaches.  Psychiatric/Behavioral: Negative.     Allergies  Oxcarbazepine  Home Medications   Current Outpatient Rx  Name  Route  Sig  Dispense  Refill  . acetaminophen (TYLENOL) 500 MG tablet   Oral   Take 1,000 mg by mouth every 6 (six) hours as needed for pain.         Marland Kitchen amLODipine (NORVASC) 10 MG tablet   Oral   Take 10 mg by mouth daily.          . benazepril (LOTENSIN) 10 MG tablet   Oral   Take 4 tablets (40 mg total) by mouth daily.   30 tablet   0   .  benazepril (LOTENSIN) 40 MG tablet   Oral   Take 40 mg by mouth 2 (two) times daily.           . hydrochlorothiazide (HYDRODIURIL) 25 MG tablet   Oral   Take 1 tablet (25 mg total) by mouth daily.   30 tablet   0   . HYDROcodone-acetaminophen (NORCO/VICODIN) 5-325 MG per tablet   Oral   Take 1 tablet by mouth every 8 (eight) hours as needed for pain.   20 tablet   0    BP 182/122  Pulse 83  Temp(Src) 98.8 F (37.1 C) (Oral)  Resp 16  Ht 5\' 5"  (1.651 m)  Wt 205 lb (92.987 kg)  BMI 34.11 kg/m2  SpO2 100% Physical Exam  Nursing note and vitals reviewed. Constitutional: He appears well-developed and well-nourished.  Uncomfortable appearing,  standing, bent over counter top upon entering room  HENT:  Head: Normocephalic.  Eyes: Conjunctivae are normal.  Neck: Normal range of motion. Neck supple.  Cardiovascular: Normal rate and intact distal pulses.   Pedal pulses normal.  Pulmonary/Chest: Effort normal.  Abdominal: Soft. Bowel sounds are normal. He exhibits no distension, no pulsatile midline mass and no mass. There is no tenderness.  Musculoskeletal: Normal range of motion. He exhibits no edema.       Lumbar back: He exhibits tenderness. He exhibits no swelling, no edema and no spasm.  Neurological: He is alert. He has normal strength. He displays no atrophy and no tremor. No sensory deficit. Gait normal.  Reflex Scores:      Patellar reflexes are 2+ on the right side and 2+ on the left side.      Achilles reflexes are 2+ on the right side and 2+ on the left side. No strength deficit noted in hip and knee flexor and extensor muscle groups.  Ankle flexion and extension intact.  Skin: Skin is warm and dry.  Psychiatric: He has a normal mood and affect.    ED Course   Procedures (including critical care time)  Labs Reviewed - No data to display No results found. No diagnosis found.  MDM  Discussed case including elevated bp prior to dc with Dr. Bebe Shaggy.  Review of patients chart including cardiology consult from last fall recommending adding labetalol 200 mg bid.  Pt is unaware of this med rec.  This was prescribed for him today, asked to f/u with his pcp within 1 week for a recheck of his bp.  Continue other meds.  Prescribed oxycodone for back pain.  No neuro deficit on exam or by history to suggest emergent or surgical presentation.  Also discussed worsened sx that should prompt immediate re-evaluation including distal weakness, bowel/bladder retention/incontinence.        Burgess Amor, PA-C 08/01/13 [phone]

## 2013-07-30 NOTE — ED Notes (Signed)
edpa request pt be moved to ED side due to elevated BP

## 2013-07-30 NOTE — ED Notes (Signed)
Pt with chronic neck pain that radiates down back, pt states taken ibuprofen at home

## 2013-08-04 NOTE — ED Provider Notes (Signed)
Medical screening examination/treatment/procedure(s) were performed by non-physician practitioner and as supervising physician I was immediately available for consultation/collaboration.   Joya Gaskins, MD 08/04/13 [phone]

## 2013-08-31 ENCOUNTER — Encounter: Payer: Self-pay | Admitting: *Deleted

## 2014-10-03 ENCOUNTER — Encounter (HOSPITAL_COMMUNITY): Payer: Self-pay | Admitting: Emergency Medicine

## 2014-10-03 ENCOUNTER — Emergency Department (HOSPITAL_COMMUNITY)
Admission: EM | Admit: 2014-10-03 | Discharge: 2014-10-03 | Disposition: A | Discharge status: Home / Self Care | Payer: Medicaid Other | Attending: Emergency Medicine | Admitting: Emergency Medicine

## 2014-10-03 DIAGNOSIS — Z79899 Other long term (current) drug therapy: Secondary | ICD-10-CM | POA: Diagnosis not present

## 2014-10-03 DIAGNOSIS — G8929 Other chronic pain: Secondary | ICD-10-CM | POA: Insufficient documentation

## 2014-10-03 DIAGNOSIS — Z720 Tobacco use: Secondary | ICD-10-CM | POA: Insufficient documentation

## 2014-10-03 DIAGNOSIS — Z8639 Personal history of other endocrine, nutritional and metabolic disease: Secondary | ICD-10-CM | POA: Diagnosis not present

## 2014-10-03 DIAGNOSIS — R51 Headache: Secondary | ICD-10-CM | POA: Diagnosis not present

## 2014-10-03 DIAGNOSIS — I10 Essential (primary) hypertension: Secondary | ICD-10-CM | POA: Insufficient documentation

## 2014-10-03 DIAGNOSIS — Z8659 Personal history of other mental and behavioral disorders: Secondary | ICD-10-CM | POA: Diagnosis not present

## 2014-10-03 DIAGNOSIS — K088 Other specified disorders of teeth and supporting structures: Secondary | ICD-10-CM | POA: Diagnosis not present

## 2014-10-03 DIAGNOSIS — K0889 Other specified disorders of teeth and supporting structures: Secondary | ICD-10-CM

## 2014-10-03 DIAGNOSIS — Z8719 Personal history of other diseases of the digestive system: Secondary | ICD-10-CM | POA: Insufficient documentation

## 2014-10-03 DIAGNOSIS — K029 Dental caries, unspecified: Secondary | ICD-10-CM | POA: Insufficient documentation

## 2014-10-03 MED ORDER — AMOXICILLIN 500 MG PO CAPS: 500.0000 mg | ORAL_CAPSULE | Freq: Three times a day (TID) | ORAL | Status: DC

## 2014-10-03 MED ORDER — HYDROCODONE-ACETAMINOPHEN 5-325 MG PO TABS: 1.0000 | ORAL_TABLET | ORAL | Status: DC | PRN

## 2014-10-03 MED ORDER — HYDROCHLOROTHIAZIDE 25 MG PO TABS: 25.0000 mg | ORAL_TABLET | Freq: Every day | ORAL | Status: DC

## 2014-10-03 MED ORDER — AMLODIPINE BESYLATE 10 MG PO TABS: 10.0000 mg | ORAL_TABLET | Freq: Every day | ORAL | Status: DC

## 2014-10-03 NOTE — ED Provider Notes (Signed)
CSN: [id number]     Arrival date & time 10/03/14  3810 History   First MD Initiated Contact with Patient 10/03/14 1005     Chief Complaint  Patient presents with  . Dental Pain     (Consider location/radiation/quality/duration/timing/severity/associated sxs/prior Treatment) Patient is a 48 y.o. male presenting with tooth pain. The history is provided by the patient.  Dental Pain Location:  Upper Quality:  Throbbing and aching Severity:  Severe Onset quality:  Sudden Duration:  1 day Timing:  Intermittent Progression:  Worsening Chronicity:  Chronic Context: dental caries and poor dentition   Relieved by:  Nothing Worsened by:  Nothing tried Ineffective treatments:  Acetaminophen Associated symptoms: facial pain, gum swelling and headaches   Associated symptoms: no difficulty swallowing, no drooling and no neck pain   Risk factors: lack of dental care and smoking     Past Medical History  Diagnosis Date  . Essential hypertension, benign   . Hyperlipidemia   . Depression   . Chronic pain   . GERD (gastroesophageal reflux disease)    Past Surgical History  Procedure Laterality Date  . Right rotator cuff repair    . Closed reduction mandibular fracture w/ arch bars      + multiple extractions  . Removal foreign body right shoulder    . Condyloma resection     Family History  Problem Relation Age of Onset  . Diabetes Mother   . Hypertension Mother   . Diabetes Father   . Hypertension Father   . Diabetes Brother   . Hypertension Brother    History  Substance Use Topics  . Smoking status: Current Every Day Smoker -- 0.50 packs/day for 30 years    Types: Cigarettes  . Smokeless tobacco: Never Used  . Alcohol Use: 0.0 oz/week     Comment: daily    Review of Systems  Constitutional: Negative for activity change.       All ROS Neg except as noted in HPI  HENT: Positive for dental problem. Negative for drooling.   Eyes: Negative for photophobia and discharge.   Respiratory: Negative for cough, shortness of breath and wheezing.   Cardiovascular: Negative for chest pain and palpitations.  Gastrointestinal: Negative for abdominal pain and blood in stool.  Genitourinary: Negative for dysuria, frequency and hematuria.  Musculoskeletal: Negative for arthralgias, back pain and neck pain.  Skin: Negative.   Neurological: Positive for headaches. Negative for dizziness, seizures and speech difficulty.  Psychiatric/Behavioral: Negative for hallucinations and confusion.      Allergies  Oxcarbazepine  Home Medications   Prior to Admission medications   Medication Sig Start Date End Date Taking? Authorizing Provider  acetaminophen (TYLENOL) 500 MG tablet Take 1,000 mg by mouth every 6 (six) hours as needed for pain.    Historical Provider, MD  amLODipine (NORVASC) 10 MG tablet Take 10 mg by mouth daily.     Historical Provider, MD  benazepril (LOTENSIN) 10 MG tablet Take 4 tablets (40 mg total) by mouth daily. 06/07/13   Maudry Diego, MD  benazepril (LOTENSIN) 40 MG tablet Take 40 mg by mouth 2 (two) times daily.      Historical Provider, MD  hydrochlorothiazide (HYDRODIURIL) 25 MG tablet Take 1 tablet (25 mg total) by mouth daily. 06/07/13   Maudry Diego, MD  HYDROcodone-acetaminophen (NORCO/VICODIN) 5-325 MG per tablet Take 1 tablet by mouth every 8 (eight) hours as needed for pain. 06/07/13   Maudry Diego, MD  labetalol (NORMODYNE) 200 MG  tablet Take 1 tablet (200 mg total) by mouth 2 (two) times daily. 07/30/13   Evalee Jefferson, PA-C  oxyCODONE-acetaminophen (PERCOCET/ROXICET) 5-325 MG per tablet Take 1 tablet by mouth every 4 (four) hours as needed for pain. 07/30/13   Evalee Jefferson, PA-C   BP 204/133  Pulse 98  Temp(Src) 98.4 F (36.9 C) (Oral)  Resp 17  Ht 5\' 6"  (1.676 m)  Wt 220 lb (99.791 kg)  BMI 35.53 kg/m2  SpO2 100% Physical Exam  Nursing note and vitals reviewed. Constitutional: He is oriented to person, place, and time. He appears  well-developed and well-nourished.  Non-toxic appearance.  HENT:  Head: Normocephalic.  Right Ear: Tympanic membrane and external ear normal.  Left Ear: Tympanic membrane and external ear normal.  Multiple dental caries, left greater than right. No visible abscess. Advanced gum disease.  Eyes: EOM and lids are normal. Pupils are equal, round, and reactive to light.  Neck: Normal range of motion. Neck supple. Carotid bruit is not present.  Cardiovascular: Normal rate, regular rhythm, normal heart sounds, intact distal pulses and normal pulses.   Pulmonary/Chest: Breath sounds normal. No respiratory distress.  Abdominal: Soft. Bowel sounds are normal. There is no tenderness. There is no guarding.  Musculoskeletal: Normal range of motion.  Lymphadenopathy:       Head (right side): No submandibular adenopathy present.       Head (left side): No submandibular adenopathy present.    He has no cervical adenopathy.  Neurological: He is alert and oriented to person, place, and time. He has normal strength. No cranial nerve deficit or sensory deficit.  Skin: Skin is warm and dry.  Psychiatric: He has a normal mood and affect. His speech is normal.    ED Course  Procedures (including critical care time) Labs Review Labs Reviewed - No data to display  Imaging Review No results found.   EKG Interpretation None      MDM  Blood pressure 204/133 on admission. Pt has hx of hypertension Vital signs reviewed. No visible abscess. No evidence for Ludwig's angina.   Plan - amoxil, norco,  For dental pain. Rx for norvasc and HCTC for hypertension until pt can see his PCP. Discussed importance of medication compliance and danger of hypertension.   Final diagnoses:  Pain, dental  Essential hypertension    *I have reviewed nursing notes, vital signs, and all appropriate lab and imaging results for this patient.Lenox Ahr, PA-C 10/06/14 [phone]

## 2014-10-03 NOTE — ED Notes (Signed)
Patient c/o left upper and lower dental pain that started yesterday. C/o this causing headache on left side.

## 2014-10-03 NOTE — Discharge Instructions (Signed)
Dental Pain Toothache is pain in or around a tooth. It may get worse with chewing or with cold or heat.  HOME CARE  Your dentist may use a numbing medicine during treatment. If so, you may need to avoid eating until the medicine wears off. Ask your dentist about this.  Only take medicine as told by your dentist or doctor.  Avoid chewing food near the painful tooth until after all treatment is done. Ask your dentist about this. GET HELP RIGHT AWAY IF:   The problem gets worse or new problems appear.  You have a fever.  There is redness and puffiness (swelling) of the face, jaw, or neck.  You cannot open your mouth.  There is pain in the jaw.  There is very bad pain that is not helped by medicine. MAKE SURE YOU:   Understand these instructions.  Will watch your condition.  Will get help right away if you are not doing well or get worse. Document Released: 05/13/2008 Document Revised: 02/17/2012 Document Reviewed: 05/13/2008 [facility] Patient Information 2015 [place], [place]. This information is not intended to replace advice given to you by your health care provider. Make sure you discuss any questions you have with your health care provider.  Hypertension Hypertension is another name for high blood pressure. High blood pressure forces your heart to work harder to pump blood. A blood pressure reading has two numbers, which includes a higher number over a lower number (example: 110/72). HOME CARE   Have your blood pressure rechecked by your doctor.  Only take medicine as told by your doctor. Follow the directions carefully. The medicine does not work as well if you skip doses. Skipping doses also puts you at risk for problems.  Do not smoke.  Monitor your blood pressure at home as told by your doctor. GET HELP IF:  You think you are having a reaction to the medicine you are taking.  You have repeat headaches or feel dizzy.  You have puffiness (swelling) in your  ankles.  You have trouble with your vision. GET HELP RIGHT AWAY IF:   You get a very bad headache and are confused.  You feel weak, numb, or faint.  You get chest or belly (abdominal) pain.  You throw up (vomit).  You cannot breathe very well. MAKE SURE YOU:   Understand these instructions.  Will watch your condition.  Will get help right away if you are not doing well or get worse. Document Released: 05/13/2008 Document Revised: 11/30/2013 Document Reviewed: 09/17/2013 [facility] Patient Information 2015 [place], [place]. This information is not intended to replace advice given to you by your health care provider. Make sure you discuss any questions you have with your health care provider.

## 2014-10-06 NOTE — ED Provider Notes (Signed)
Medical screening examination/treatment/procedure(s) were performed by non-physician practitioner and as supervising physician I was immediately available for consultation/collaboration.   EKG Interpretation None        Delice Bison Chasity Outten, DO 10/06/14 1220

## 2014-11-24 ENCOUNTER — Emergency Department (HOSPITAL_COMMUNITY)
Admission: EM | Admit: 2014-11-24 | Discharge: 2014-11-24 | Disposition: A | Discharge status: Home / Self Care | Payer: Medicaid Other | Attending: Emergency Medicine | Admitting: Emergency Medicine

## 2014-11-24 ENCOUNTER — Encounter (HOSPITAL_COMMUNITY): Payer: Self-pay | Admitting: Emergency Medicine

## 2014-11-24 DIAGNOSIS — Z8719 Personal history of other diseases of the digestive system: Secondary | ICD-10-CM | POA: Diagnosis not present

## 2014-11-24 DIAGNOSIS — S3091XA Unspecified superficial injury of lower back and pelvis, initial encounter: Secondary | ICD-10-CM | POA: Insufficient documentation

## 2014-11-24 DIAGNOSIS — Z8659 Personal history of other mental and behavioral disorders: Secondary | ICD-10-CM | POA: Diagnosis not present

## 2014-11-24 DIAGNOSIS — Z79899 Other long term (current) drug therapy: Secondary | ICD-10-CM | POA: Insufficient documentation

## 2014-11-24 DIAGNOSIS — Y998 Other external cause status: Secondary | ICD-10-CM | POA: Diagnosis not present

## 2014-11-24 DIAGNOSIS — Y9301 Activity, walking, marching and hiking: Secondary | ICD-10-CM | POA: Diagnosis not present

## 2014-11-24 DIAGNOSIS — Z8639 Personal history of other endocrine, nutritional and metabolic disease: Secondary | ICD-10-CM | POA: Insufficient documentation

## 2014-11-24 DIAGNOSIS — Y9289 Other specified places as the place of occurrence of the external cause: Secondary | ICD-10-CM | POA: Diagnosis not present

## 2014-11-24 DIAGNOSIS — W108XXA Fall (on) (from) other stairs and steps, initial encounter: Secondary | ICD-10-CM | POA: Diagnosis not present

## 2014-11-24 DIAGNOSIS — Z720 Tobacco use: Secondary | ICD-10-CM | POA: Diagnosis not present

## 2014-11-24 DIAGNOSIS — I10 Essential (primary) hypertension: Secondary | ICD-10-CM | POA: Diagnosis not present

## 2014-11-24 DIAGNOSIS — M549 Dorsalgia, unspecified: Secondary | ICD-10-CM

## 2014-11-24 DIAGNOSIS — R3919 Other difficulties with micturition: Secondary | ICD-10-CM | POA: Insufficient documentation

## 2014-11-24 DIAGNOSIS — G8929 Other chronic pain: Secondary | ICD-10-CM | POA: Insufficient documentation

## 2014-11-24 LAB — URINALYSIS, ROUTINE W REFLEX MICROSCOPIC
Bilirubin Urine: NEGATIVE
Glucose, UA: NEGATIVE mg/dL
KETONES UR: NEGATIVE mg/dL
LEUKOCYTES UA: NEGATIVE
NITRITE: NEGATIVE
PH: 5 (ref 5.0–8.0)
Protein, ur: 100 mg/dL — AB
SPECIFIC GRAVITY, URINE: 1.025 (ref 1.005–1.030)
Urobilinogen, UA: 0.2 mg/dL (ref 0.0–1.0)

## 2014-11-24 LAB — URINE MICROSCOPIC-ADD ON

## 2014-11-24 MED ORDER — OXYCODONE-ACETAMINOPHEN 5-325 MG PO TABS: 1.0000 | ORAL_TABLET | ORAL | Status: DC | PRN

## 2014-11-24 MED ORDER — IBUPROFEN 800 MG PO TABS
800.0000 mg | ORAL_TABLET | Freq: Once | ORAL | Status: AC
Start: 2014-11-24 — End: 2014-11-24
  Administered 2014-11-24: 800 mg via ORAL
  Filled 2014-11-24: qty 1

## 2014-11-24 NOTE — ED Provider Notes (Signed)
CSN: [id number]     Arrival date & time 11/24/14  1105 History  This chart was scribed for Sharyon Cable, MD by Tula Nakayama, ED Scribe. This patient was seen in room APA07/APA07 and the patient's care was started at 11:45 AM.    Chief Complaint  Patient presents with  . Tailbone Pain   Patient is a 49 y.o. male presenting with back pain. The history is provided by the patient. No language interpreter was used.  Back Pain Location:  Sacro-iliac joint Quality:  Aching Radiates to:  Does not radiate Pain severity:  Moderate Pain is:  Same all the time Onset quality:  Gradual Duration:  2 days Timing:  Constant Progression:  Unchanged Chronicity:  New Context: falling   Relieved by:  Nothing Worsened by:  Coughing, sneezing and bowel movement Ineffective treatments:  None tried Associated symptoms: no abdominal pain, no bladder incontinence, no bowel incontinence, no chest pain, no fever, no numbness and no weakness     HPI Comments: Taylor Obrien is a 48 y.o. male who presents to the Emergency Department complaining of constant, unchanged, moderate sacral pain that started 2 days ago after he fell onto his buttocks. He notes difficulty moving bowels and difficulty urinating due to pain as associated symptoms. Pt states he missed a step while walking on stairs. He reports that the pain becomes worse with sneezing, coughing and bowel movements. He denies a history of back surgery. Pt also denies fever, vomiting, abdominal pain, chest pain, weakness, numbness and incontinence as associated symptoms.  Past Medical History  Diagnosis Date  . Essential hypertension, benign   . Hyperlipidemia   . Depression   . Chronic pain   . GERD (gastroesophageal reflux disease)    Past Surgical History  Procedure Laterality Date  . Right rotator cuff repair    . Closed reduction mandibular fracture w/ arch bars      + multiple extractions  . Removal foreign body right shoulder    .  Condyloma resection     Family History  Problem Relation Age of Onset  . Diabetes Mother   . Hypertension Mother   . Diabetes Father   . Hypertension Father   . Diabetes Brother   . Hypertension Brother    History  Substance Use Topics  . Smoking status: Current Every Day Smoker -- 0.50 packs/day for 30 years    Types: Cigarettes  . Smokeless tobacco: Never Used  . Alcohol Use: 0.0 oz/week     Comment: daily-per patient 10 40oz a week    Review of Systems  Constitutional: Negative for fever.  Cardiovascular: Negative for chest pain.  Gastrointestinal: Negative for vomiting, abdominal pain and bowel incontinence.  Genitourinary: Positive for difficulty urinating. Negative for bladder incontinence.  Musculoskeletal: Positive for back pain.  Skin: Negative for wound.  Neurological: Negative for weakness and numbness.  All other systems reviewed and are negative.   Allergies  Oxcarbazepine  Home Medications   Prior to Admission medications   Medication Sig Start Date End Date Taking? Authorizing Provider  acetaminophen (TYLENOL) 500 MG tablet Take 1,000 mg by mouth every 6 (six) hours as needed for pain.   Yes Historical Provider, MD  amLODipine (NORVASC) 10 MG tablet Take 1 tablet (10 mg total) by mouth daily. 10/03/14  Yes Lenox Ahr, PA-C  benazepril (LOTENSIN) 40 MG tablet Take 40 mg by mouth 2 (two) times daily.     Yes Historical Provider, MD  hydrochlorothiazide (HYDRODIURIL) 25  MG tablet Take 1 tablet (25 mg total) by mouth daily. 10/03/14  Yes Lenox Ahr, PA-C  labetalol (NORMODYNE) 200 MG tablet Take 1 tablet (200 mg total) by mouth 2 (two) times daily. 07/30/13  Yes Evalee Jefferson, PA-C  oxyCODONE-acetaminophen (PERCOCET/ROXICET) 5-325 MG per tablet Take 1 tablet by mouth every 4 (four) hours as needed for severe pain. 11/24/14   Sharyon Cable, MD   BP 168/107 mmHg  Pulse 98  Temp(Src) 98.7 F (37.1 C) (Oral)  Resp 20  Ht 5\' 5"  (1.651 m)  Wt 220 lb  (99.791 kg)  BMI 36.61 kg/m2  SpO2 98% Physical Exam  Nursing note and vitals reviewed.  CONSTITUTIONAL: Well developed/well nourished HEAD: Normocephalic/atraumatic EYES: EOMI/PERRL ENMT: Mucous membranes moist NECK: supple no meningeal signs SPINE/BACK: sacral tenderness, no other spinal tenderness No bruising/crepitance/stepoffs noted to spine CV: S1/S2 noted, no murmurs/rubs/gallops noted LUNGS: Lungs are clear to auscultation bilaterally, no apparent distress ABDOMEN: soft, nontender, no rebound or guarding GU:no cva tenderness NEURO: Awake/alert, equal motor 5/5 strength noted with the following: hip flexion/knee flexion/extension, foot dorsi/plantar flexion, great toe extension intact bilaterally, no clonus bilaterally, plantar reflex appropriate (toes downgoing), no sensory deficit in any dermatome.  Equal patellar/achilles reflex noted (2+) in bilateral lower extremities.  Pt is able to ambulate unassisted. EXTREMITIES: pulses normal, full ROM SKIN: warm, color normal PSYCH: no abnormalities of mood noted, alert and oriented to situation   ED Course  Procedures  DIAGNOSTIC STUDIES: Oxygen Saturation is 98% on RA, normal by my interpretation.    COORDINATION OF CARE: 11:51 AM Discussed treatment plan with pt at bedside and pt agreed to plan. Pt well appearing, he is ambulatory, he had no thoracic/lumbar tenderness, I don't imaging required at this time  Labs Review Labs Reviewed  URINALYSIS, ROUTINE W REFLEX MICROSCOPIC - Abnormal; Notable for the following:    Hgb urine dipstick TRACE (*)    Protein, ur 100 (*)    All other components within normal limits  URINE MICROSCOPIC-ADD ON - Abnormal; Notable for the following:    Squamous Epithelial / LPF FEW (*)    All other components within normal limits    MDM   Final diagnoses:  None    Nursing notes including past medical history and social history reviewed and considered in documentation Labs/vital reviewed  myself and considered during evaluation   I personally performed the services described in this documentation, which was scribed in my presence. The recorded information has been reviewed and is accurate.      Sharyon Cable, MD 11/24/14 [id number]

## 2014-11-24 NOTE — ED Notes (Addendum)
Patient c/o sacral pain. Per patient was carrying food in hands and was not paying attention to steps, missed step and fall onto buttock. Denies hitting head or LOC. Patient reports taking ibuprofen with no relief. Patient ambulated to triage, gait steady. Per patient pain with urinating and BMs.

## 2014-11-24 NOTE — ED Notes (Signed)
Patient given discharge instruction, verbalized understand. Patient ambulatory out of the department.  

## 2014-11-24 NOTE — Discharge Instructions (Signed)

## 2015-01-29 ENCOUNTER — Emergency Department (HOSPITAL_COMMUNITY)
Admission: EM | Admit: 2015-01-29 | Discharge: 2015-01-29 | Disposition: A | Discharge status: Home / Self Care | Payer: Medicaid Other | Attending: Emergency Medicine | Admitting: Emergency Medicine

## 2015-01-29 ENCOUNTER — Encounter (HOSPITAL_COMMUNITY): Payer: Self-pay

## 2015-01-29 DIAGNOSIS — Z8659 Personal history of other mental and behavioral disorders: Secondary | ICD-10-CM | POA: Diagnosis not present

## 2015-01-29 DIAGNOSIS — R319 Hematuria, unspecified: Secondary | ICD-10-CM | POA: Diagnosis present

## 2015-01-29 DIAGNOSIS — Z8719 Personal history of other diseases of the digestive system: Secondary | ICD-10-CM | POA: Insufficient documentation

## 2015-01-29 DIAGNOSIS — Z720 Tobacco use: Secondary | ICD-10-CM | POA: Insufficient documentation

## 2015-01-29 DIAGNOSIS — I10 Essential (primary) hypertension: Secondary | ICD-10-CM | POA: Insufficient documentation

## 2015-01-29 DIAGNOSIS — Z79899 Other long term (current) drug therapy: Secondary | ICD-10-CM | POA: Diagnosis not present

## 2015-01-29 DIAGNOSIS — Z8639 Personal history of other endocrine, nutritional and metabolic disease: Secondary | ICD-10-CM | POA: Insufficient documentation

## 2015-01-29 DIAGNOSIS — R361 Hematospermia: Secondary | ICD-10-CM | POA: Diagnosis not present

## 2015-01-29 DIAGNOSIS — G8929 Other chronic pain: Secondary | ICD-10-CM | POA: Insufficient documentation

## 2015-01-29 LAB — URINE MICROSCOPIC-ADD ON

## 2015-01-29 LAB — URINALYSIS, ROUTINE W REFLEX MICROSCOPIC
Bilirubin Urine: NEGATIVE
Glucose, UA: NEGATIVE mg/dL
Ketones, ur: NEGATIVE mg/dL
Leukocytes, UA: NEGATIVE
Nitrite: NEGATIVE
Protein, ur: >300 mg/dL — AB
Specific Gravity, Urine: >1.03 — ABNORMAL HIGH (ref 1.005–1.030)
Urobilinogen, UA: 0.2 mg/dL (ref 0.0–1.0)
pH: 6 (ref 5.0–8.0)

## 2015-01-29 MED ORDER — AMLODIPINE BESYLATE 5 MG PO TABS
10.0000 mg | ORAL_TABLET | Freq: Once | ORAL | Status: AC
  Administered 2015-01-29: 10 mg via ORAL
  Filled 2015-01-29: qty 2

## 2015-01-29 MED ORDER — HYDROCHLOROTHIAZIDE 25 MG PO TABS
25.0000 mg | ORAL_TABLET | Freq: Every day | ORAL | Status: DC
  Administered 2015-01-29: 25 mg via ORAL
  Filled 2015-01-29 (×2): qty 1

## 2015-01-29 NOTE — ED Notes (Signed)
MD at bedside. 

## 2015-01-29 NOTE — ED Provider Notes (Signed)
CSN: [id number]     Arrival date & time 01/29/15  7106 History  This chart was scribed for Taylor Manifold, MD by Dellis Filbert, ED Scribe. The patient was seen in [place] and the patient's care was started at 7:57 AM.   Chief Complaint  Patient presents with  . S74.5    The history is provided by the patient. No language interpreter was used.    HPI Comments: Taylor Obrien is a 49 y.o. male who presents to the Emergency Department complaining of blood in his semen and urine. First noticed 8 days ago. Blood is a bright red. Noticed more when he ejaculates but has seen it in his urine. Pt does have a history of HTN.  Past Medical History  Diagnosis Date  . Essential hypertension, benign   . Hyperlipidemia   . Depression   . Chronic pain   . GERD (gastroesophageal reflux disease)    Past Surgical History  Procedure Laterality Date  . Right rotator cuff repair    . Closed reduction mandibular fracture w/ arch bars      + multiple extractions  . Removal foreign body right shoulder    . Condyloma resection     Family History  Problem Relation Age of Onset  . Diabetes Mother   . Hypertension Mother   . Diabetes Father   . Hypertension Father   . Diabetes Brother   . Hypertension Brother    History  Substance Use Topics  . Smoking status: Current Every Day Smoker -- 0.50 packs/day for 30 years    Types: Cigarettes  . Smokeless tobacco: Never Used  . Alcohol Use: 0.0 oz/week     Comment: daily-per patient 10 40oz a week    Review of Systems  Genitourinary: Positive for hematuria.  All other systems reviewed and are negative.  Allergies  Oxcarbazepine  Home Medications   Prior to Admission medications   Medication Sig Start Date End Date Taking? Authorizing Provider  acetaminophen (TYLENOL) 500 MG tablet Take 1,000 mg by mouth every 6 (six) hours as needed for pain.    Historical Provider, MD  amLODipine (NORVASC) 10 MG tablet Take 1 tablet (10 mg total) by mouth  daily. 10/03/14   Lenox Ahr, PA-C  benazepril (LOTENSIN) 40 MG tablet Take 40 mg by mouth 2 (two) times daily.      Historical Provider, MD  hydrochlorothiazide (HYDRODIURIL) 25 MG tablet Take 1 tablet (25 mg total) by mouth daily. 10/03/14   Lenox Ahr, PA-C  labetalol (NORMODYNE) 200 MG tablet Take 1 tablet (200 mg total) by mouth 2 (two) times daily. 07/30/13   Evalee Jefferson, PA-C  oxyCODONE-acetaminophen (PERCOCET/ROXICET) 5-325 MG per tablet Take 1 tablet by mouth every 4 (four) hours as needed for severe pain. 11/24/14   Sharyon Cable, MD   BP 183/143 mmHg  Pulse 96  Temp(Src) 98.6 F (37 C) (Oral)  Resp 18  Ht 5\' 5"  (1.651 m)  Wt 205 lb (92.987 kg)  BMI 34.11 kg/m2  SpO2 97% Physical Exam  Constitutional: He appears well-developed and well-nourished. No distress.  HENT:  Head: Normocephalic and atraumatic.  Eyes: Conjunctivae are normal. Right eye exhibits no discharge. Left eye exhibits no discharge.  Neck: Neck supple.  Cardiovascular: Normal rate, regular rhythm and normal heart sounds.  Exam reveals no gallop and no friction rub.   No murmur heard. Pulmonary/Chest: Effort normal and breath sounds normal. No respiratory distress.  Abdominal: Soft. He exhibits no distension.  There is no tenderness.  Genitourinary:  Normal external male genitalia. No lesions noted. No discharge. No adenopathy. Testicles nontender. Prostate smooth, mildly enlarged, nontender.   Musculoskeletal: He exhibits no edema or tenderness.  Neurological: He is alert.  Skin: Skin is warm and dry.  Psychiatric: He has a normal mood and affect. His behavior is normal. Thought content normal.  Nursing note and vitals reviewed.   ED Course  Procedures  DIAGNOSTIC STUDIES: Oxygen Saturation is 97% on room air, normal by my interpretation.    COORDINATION OF CARE: 8:03 AM Discussed treatment plan with pt at bedside and pt agreed to plan.  Labs Review Labs Reviewed  URINALYSIS, ROUTINE W  REFLEX MICROSCOPIC  RPR  GC/CHLAMYDIA PROBE AMP ([place])   Imaging Review No results found.   EKG Interpretation None      MDM   Final diagnoses:  Hematospermia   48yM with hematospermia. No other complaints. Exam nonfocal. Likely benign process. Will check for STD. Prostate felt normal. Potentially 2/2 malignant hypertension? No symptoms otherwise. Proteinuria noted though. Advised of importance of compliance with medications and need to not delay follow-up with PCP as I suspect he will need titration of meds. Reassurance provided at this point. Urology follow-up information provided if these symptoms persist beyond a couple more weeksor he has other symptoms.   Vitals - 1 value per visit 01/29/2015 92/42/6834  SYSTOLIC 196 222  DIASTOLIC 979 892[[facility] patient has taking B   Vitals - 1 value per visit 10/03/2014 07/30/2013 03/25/4080  SYSTOLIC 448 185 631  DIASTOLIC 497[WYO not had BP meds x 2 days.[ 126 119   Vitals - 1 value per visit 3/78/5885  SYSTOLIC 027  DIASTOLIC 741     I personally preformed the services scribed in my presence. The recorded information has been reviewed is accurate. Taylor Manifold, MD.      Taylor Manifold, MD 02/02/15 [phone]

## 2015-01-29 NOTE — ED Notes (Signed)
Pt reports  Dark red blood in semen since valentines day.

## 2015-01-29 NOTE — ED Notes (Signed)
Pharmacy called and they stated medication should be delivered to ED as soon as possible.

## 2015-01-30 LAB — GC/CHLAMYDIA PROBE AMP (~~LOC~~) NOT AT ARMC
Chlamydia: NEGATIVE
Neisseria Gonorrhea: NEGATIVE

## 2015-01-30 LAB — RPR: RPR Ser Ql: NONREACTIVE

## 2015-01-31 LAB — URINE CULTURE
Colony Count: NO GROWTH
Culture: NO GROWTH

## 2015-09-22 ENCOUNTER — Encounter (HOSPITAL_COMMUNITY): Payer: Self-pay

## 2015-09-22 ENCOUNTER — Emergency Department (HOSPITAL_COMMUNITY)
Admission: EM | Admit: 2015-09-22 | Discharge: 2015-09-22 | Disposition: A | Discharge status: Home / Self Care | Payer: Medicaid Other | Attending: Emergency Medicine | Admitting: Emergency Medicine

## 2015-09-22 DIAGNOSIS — Z8639 Personal history of other endocrine, nutritional and metabolic disease: Secondary | ICD-10-CM | POA: Diagnosis not present

## 2015-09-22 DIAGNOSIS — Z79899 Other long term (current) drug therapy: Secondary | ICD-10-CM | POA: Insufficient documentation

## 2015-09-22 DIAGNOSIS — G8929 Other chronic pain: Secondary | ICD-10-CM | POA: Insufficient documentation

## 2015-09-22 DIAGNOSIS — Z8659 Personal history of other mental and behavioral disorders: Secondary | ICD-10-CM | POA: Diagnosis not present

## 2015-09-22 DIAGNOSIS — Z8719 Personal history of other diseases of the digestive system: Secondary | ICD-10-CM | POA: Insufficient documentation

## 2015-09-22 DIAGNOSIS — Z720 Tobacco use: Secondary | ICD-10-CM | POA: Diagnosis not present

## 2015-09-22 DIAGNOSIS — N410 Acute prostatitis: Secondary | ICD-10-CM | POA: Diagnosis not present

## 2015-09-22 DIAGNOSIS — I10 Essential (primary) hypertension: Secondary | ICD-10-CM | POA: Insufficient documentation

## 2015-09-22 DIAGNOSIS — R369 Urethral discharge, unspecified: Secondary | ICD-10-CM | POA: Diagnosis present

## 2015-09-22 LAB — URINALYSIS, ROUTINE W REFLEX MICROSCOPIC
Bilirubin Urine: NEGATIVE
GLUCOSE, UA: NEGATIVE mg/dL
KETONES UR: NEGATIVE mg/dL
Leukocytes, UA: NEGATIVE
NITRITE: NEGATIVE
Protein, ur: 100 mg/dL — AB
Specific Gravity, Urine: >1.03 — ABNORMAL HIGH (ref 1.005–1.030)
Urobilinogen, UA: 0.2 mg/dL (ref 0.0–1.0)
pH: 6 (ref 5.0–8.0)

## 2015-09-22 LAB — URINE MICROSCOPIC-ADD ON

## 2015-09-22 LAB — CBG MONITORING, ED: Glucose-Capillary: 105 mg/dL — ABNORMAL HIGH (ref 65–99)

## 2015-09-22 MED ORDER — CIPROFLOXACIN HCL 500 MG PO TABS: 500.0000 mg | ORAL_TABLET | Freq: Two times a day (BID) | ORAL | Status: DC

## 2015-09-22 NOTE — ED Notes (Signed)
Patient states he noticed blood in his semen when he ejaculates. Patient denies pain or any other urinary symptoms.

## 2015-09-22 NOTE — ED Provider Notes (Signed)
CSN: [id number]     Arrival date & time 09/22/15  5993 History  By signing my name below, I, Taylor Obrien, attest that this documentation has been prepared under the direction and in the presence of No att. providers found. Electronically Signed: Terressa Obrien, ED Scribe. 09/22/2015. 8:32 AM.  Chief Complaint  Patient presents with  . Penile Discharge   The history is provided by the patient. No language interpreter was used.   HPI Comments: Taylor Obrien is a 49 y.o. male, with PMHx noted below, who presents to the Emergency Department complaining of penile discharge onset several days ago. Pt describes the discharge as blood in his semen that occurs only after sexual intercourse. Pt is sexually active with women only. Pt denies any urinary Sx (including dysuria, hematuria), however, pt notes that he urinates 40x a day at baseline. Pt further fever, chills, flank pain, perineal pain.   Past Medical History  Diagnosis Date  . Essential hypertension, benign   . Hyperlipidemia   . Depression   . Chronic pain   . GERD (gastroesophageal reflux disease)    Past Surgical History  Procedure Laterality Date  . Right rotator cuff repair    . Closed reduction mandibular fracture w/ arch bars      + multiple extractions  . Removal foreign body right shoulder    . Condyloma resection     Family History  Problem Relation Age of Onset  . Diabetes Mother   . Hypertension Mother   . Diabetes Father   . Hypertension Father   . Diabetes Brother   . Hypertension Brother    Social History  Substance Use Topics  . Smoking status: Current Every Day Smoker -- 0.50 packs/day for 30 years    Types: Cigarettes  . Smokeless tobacco: Never Used  . Alcohol Use: 0.0 oz/week     Comment: daily-per patient 10 40oz a week    Review of Systems  Constitutional: Negative for fever and chills.  Genitourinary: Positive for frequency (at baseline  (urinates 40x a day) ) and discharge. Negative for dysuria,  hematuria, flank pain and penile pain.    A complete 10 system review of systems was obtained and all systems are negative except as noted in the HPI and PMH.    Allergies  Oxcarbazepine  Home Medications   Prior to Admission medications   Medication Sig Start Date End Date Taking? Authorizing Provider  acetaminophen (TYLENOL) 500 MG tablet Take 1,000 mg by mouth every 6 (six) hours as needed for pain.    Historical Provider, MD  amLODipine (NORVASC) 10 MG tablet Take 1 tablet (10 mg total) by mouth daily. 10/03/14   Lily Kocher, PA-C  ciprofloxacin (CIPRO) 500 MG tablet Take 1 tablet (500 mg total) by mouth 2 (two) times daily. 09/22/15   Nat Christen, MD  hydrochlorothiazide (HYDRODIURIL) 25 MG tablet Take 1 tablet (25 mg total) by mouth daily. 10/03/14   Lily Kocher, PA-C  labetalol (NORMODYNE) 200 MG tablet Take 1 tablet (200 mg total) by mouth 2 (two) times daily. Patient not taking: Reported on 01/29/2015 07/30/13   Evalee Jefferson, PA-C  oxyCODONE-acetaminophen (PERCOCET/ROXICET) 5-325 MG per tablet Take 1 tablet by mouth every 4 (four) hours as needed for severe pain. Patient not taking: Reported on 01/29/2015 11/24/14   Ripley Fraise, MD   Triage Vitals: BP 215/128 mmHg  Pulse 97  Temp(Src) 98.1 F (36.7 C) (Oral)  Resp 20  Ht 5\' 5"  (1.651 m)  Wt 197  lb (89.359 kg)  BMI 32.78 kg/m2  SpO2 100% Physical Exam  Constitutional: He is oriented to person, place, and time. He appears well-developed and well-nourished.  HENT:  Head: Normocephalic and atraumatic.  Eyes: Conjunctivae and EOM are normal. Pupils are equal, round, and reactive to light.  Neck: Normal range of motion. Neck supple.  Cardiovascular: Normal rate and regular rhythm.   Pulmonary/Chest: Effort normal and breath sounds normal.  Abdominal: Soft. Bowel sounds are normal.  Genitourinary: Testes normal and penis normal. Circumcised.  Perineum normal.   Musculoskeletal: Normal range of motion.  Neurological: He  is alert and oriented to person, place, and time.  Skin: Skin is warm and dry.  Psychiatric: He has a normal mood and affect. His behavior is normal.  Nursing note and vitals reviewed.   ED Course  Procedures (including critical care time) DIAGNOSTIC STUDIES: Oxygen Saturation is 100% on ra, nl by my interpretation.    COORDINATION OF CARE: 8:11 AM-Discussed treatment plan which includes UA and GC/chlamydia swab, consult with urology, with pt at bedside and pt agreed to plan.   Labs Review Labs Reviewed  URINALYSIS, ROUTINE W REFLEX MICROSCOPIC (NOT AT [facility]) - Abnormal; Notable for the following:    Specific Gravity, Urine >1.030 (*)    Hgb urine dipstick SMALL (*)    Protein, ur 100 (*)    All other components within normal limits  URINE MICROSCOPIC-ADD ON - Abnormal; Notable for the following:    Casts GRANULAR CAST (*)    All other components within normal limits  CBG MONITORING, ED - Abnormal; Notable for the following:    Glucose-Capillary 105 (*)    All other components within normal limits  GC/CHLAMYDIA PROBE AMP ([place]) NOT AT [facility]   I have personally reviewed and evaluated these lab results as part of my medical decision-making.  MDM   Final diagnoses:  Acute prostatitis    Patient has not taken his blood pressure medicine yet today. He states his BP is normally very high. Discharge medications Cipro 500 mg twice a day. Referral to urology for prostate study.  I, [name], personally performed the services described in this documentation. All medical record entries made by the scribe were at my direction and in my presence.  I have reviewed the chart and discharge instructions and agree that the record reflects my personal performance and is accurate and complete. Ocean Schildt.  09/22/2015. 3:47 PM.     Nat Christen, MD 09/22/15 [phone]

## 2015-09-22 NOTE — Discharge Instructions (Signed)
Prostatitis Prostatitis is redness, soreness, and puffiness (swelling) of the prostate gland. The prostate gland is the walnut-sized gland located just below your bladder. HOME CARE:   Take all medicines as told by your doctor.  Take warm-water baths (sitz baths) as told by your doctor. GET HELP IF:  Your symptoms get worse, not better.  You have a fever. GET HELP RIGHT AWAY IF:   You have chills.  You feel sick to your stomach (nauseous) or like you will throw up (vomit).  You feel lightheaded or like you will pass out (faint).  You are unable to pee (urinate).  You have blood or blood clumps (clots) in your pee (urine). MAKE SURE YOU:  Understand these instructions.  Will watch your condition.  Will get help right away if you are not doing well or get worse.   This information is not intended to replace advice given to you by your health care provider. Make sure you discuss any questions you have with your health care provider.   Document Released: 05/26/2012 Document Revised: 12/16/2014 Document Reviewed: 06/14/2013 Elsevier Interactive Patient Education 2016 [employer].   Prescription for antibiotic. You'll need to see a specialist called a urologist. Phone number given. I would recommend calling them today to try to get an appointment.   We have also obtained cultures for gonorrhea and chlamydia which are pending.  Your glucose was normal

## 2015-09-25 LAB — GC/CHLAMYDIA PROBE AMP (~~LOC~~) NOT AT ARMC
CHLAMYDIA, DNA PROBE: NEGATIVE
NEISSERIA GONORRHEA: NEGATIVE

## 2015-11-18 ENCOUNTER — Inpatient Hospital Stay (HOSPITAL_COMMUNITY): Payer: Medicaid Other | Admitting: Anesthesiology

## 2015-11-18 ENCOUNTER — Inpatient Hospital Stay (HOSPITAL_COMMUNITY): Payer: Medicaid Other

## 2015-11-18 ENCOUNTER — Encounter (HOSPITAL_COMMUNITY)
Admission: EM | Disposition: A | Discharge status: Rehabilitation Facility | Payer: Self-pay | Source: Home / Self Care | Attending: Neurology

## 2015-11-18 ENCOUNTER — Encounter (HOSPITAL_COMMUNITY): Payer: Self-pay

## 2015-11-18 ENCOUNTER — Emergency Department (HOSPITAL_COMMUNITY): Payer: Medicaid Other

## 2015-11-18 ENCOUNTER — Inpatient Hospital Stay (HOSPITAL_COMMUNITY)
Admission: EM | Admit: 2015-11-18 | Discharge: 2015-11-23 | DRG: 062 | Disposition: A | Discharge status: Rehabilitation Facility | Payer: Medicaid Other | Attending: Neurology | Admitting: Neurology

## 2015-11-18 DIAGNOSIS — N179 Acute kidney failure, unspecified: Secondary | ICD-10-CM | POA: Diagnosis not present

## 2015-11-18 DIAGNOSIS — Z888 Allergy status to other drugs, medicaments and biological substances status: Secondary | ICD-10-CM | POA: Diagnosis not present

## 2015-11-18 DIAGNOSIS — I69398 Other sequelae of cerebral infarction: Secondary | ICD-10-CM | POA: Diagnosis not present

## 2015-11-18 DIAGNOSIS — R471 Dysarthria and anarthria: Secondary | ICD-10-CM | POA: Diagnosis present

## 2015-11-18 DIAGNOSIS — I10 Essential (primary) hypertension: Secondary | ICD-10-CM | POA: Insufficient documentation

## 2015-11-18 DIAGNOSIS — G894 Chronic pain syndrome: Secondary | ICD-10-CM | POA: Diagnosis present

## 2015-11-18 DIAGNOSIS — E782 Mixed hyperlipidemia: Secondary | ICD-10-CM | POA: Insufficient documentation

## 2015-11-18 DIAGNOSIS — G8194 Hemiplegia, unspecified affecting left nondominant side: Secondary | ICD-10-CM | POA: Diagnosis present

## 2015-11-18 DIAGNOSIS — G9589 Other specified diseases of spinal cord: Secondary | ICD-10-CM | POA: Diagnosis present

## 2015-11-18 DIAGNOSIS — F418 Other specified anxiety disorders: Secondary | ICD-10-CM | POA: Diagnosis present

## 2015-11-18 DIAGNOSIS — I63421 Cerebral infarction due to embolism of right anterior cerebral artery: Secondary | ICD-10-CM | POA: Diagnosis present

## 2015-11-18 DIAGNOSIS — Z6836 Body mass index (BMI) 36.0-36.9, adult: Secondary | ICD-10-CM

## 2015-11-18 DIAGNOSIS — Z8249 Family history of ischemic heart disease and other diseases of the circulatory system: Secondary | ICD-10-CM

## 2015-11-18 DIAGNOSIS — I6350 Cerebral infarction due to unspecified occlusion or stenosis of unspecified cerebral artery: Secondary | ICD-10-CM

## 2015-11-18 DIAGNOSIS — K219 Gastro-esophageal reflux disease without esophagitis: Secondary | ICD-10-CM | POA: Diagnosis present

## 2015-11-18 DIAGNOSIS — E785 Hyperlipidemia, unspecified: Secondary | ICD-10-CM | POA: Diagnosis present

## 2015-11-18 DIAGNOSIS — Z881 Allergy status to other antibiotic agents status: Secondary | ICD-10-CM

## 2015-11-18 DIAGNOSIS — G4733 Obstructive sleep apnea (adult) (pediatric): Secondary | ICD-10-CM | POA: Diagnosis not present

## 2015-11-18 DIAGNOSIS — I639 Cerebral infarction, unspecified: Secondary | ICD-10-CM | POA: Diagnosis not present

## 2015-11-18 DIAGNOSIS — F1010 Alcohol abuse, uncomplicated: Secondary | ICD-10-CM | POA: Diagnosis present

## 2015-11-18 DIAGNOSIS — I63031 Cerebral infarction due to thrombosis of right carotid artery: Secondary | ICD-10-CM | POA: Insufficient documentation

## 2015-11-18 DIAGNOSIS — R269 Unspecified abnormalities of gait and mobility: Secondary | ICD-10-CM | POA: Diagnosis not present

## 2015-11-18 DIAGNOSIS — R531 Weakness: Secondary | ICD-10-CM | POA: Diagnosis present

## 2015-11-18 DIAGNOSIS — F17210 Nicotine dependence, cigarettes, uncomplicated: Secondary | ICD-10-CM | POA: Diagnosis present

## 2015-11-18 DIAGNOSIS — I6789 Other cerebrovascular disease: Secondary | ICD-10-CM | POA: Diagnosis not present

## 2015-11-18 DIAGNOSIS — I129 Hypertensive chronic kidney disease with stage 1 through stage 4 chronic kidney disease, or unspecified chronic kidney disease: Secondary | ICD-10-CM | POA: Diagnosis not present

## 2015-11-18 DIAGNOSIS — F32A Depression, unspecified: Secondary | ICD-10-CM | POA: Insufficient documentation

## 2015-11-18 DIAGNOSIS — R740 Nonspecific elevation of levels of transaminase and lactic acid dehydrogenase [LDH]: Secondary | ICD-10-CM | POA: Diagnosis present

## 2015-11-18 DIAGNOSIS — I69354 Hemiplegia and hemiparesis following cerebral infarction affecting left non-dominant side: Secondary | ICD-10-CM | POA: Diagnosis not present

## 2015-11-18 DIAGNOSIS — F1210 Cannabis abuse, uncomplicated: Secondary | ICD-10-CM | POA: Insufficient documentation

## 2015-11-18 DIAGNOSIS — R300 Dysuria: Secondary | ICD-10-CM | POA: Diagnosis not present

## 2015-11-18 DIAGNOSIS — I739 Peripheral vascular disease, unspecified: Secondary | ICD-10-CM | POA: Diagnosis present

## 2015-11-18 DIAGNOSIS — F329 Major depressive disorder, single episode, unspecified: Secondary | ICD-10-CM | POA: Diagnosis not present

## 2015-11-18 DIAGNOSIS — R609 Edema, unspecified: Secondary | ICD-10-CM | POA: Diagnosis not present

## 2015-11-18 DIAGNOSIS — I69954 Hemiplegia and hemiparesis following unspecified cerebrovascular disease affecting left non-dominant side: Secondary | ICD-10-CM | POA: Diagnosis not present

## 2015-11-18 DIAGNOSIS — R030 Elevated blood-pressure reading, without diagnosis of hypertension: Secondary | ICD-10-CM | POA: Diagnosis not present

## 2015-11-18 DIAGNOSIS — I63521 Cerebral infarction due to unspecified occlusion or stenosis of right anterior cerebral artery: Secondary | ICD-10-CM | POA: Insufficient documentation

## 2015-11-18 DIAGNOSIS — N170 Acute kidney failure with tubular necrosis: Secondary | ICD-10-CM | POA: Diagnosis not present

## 2015-11-18 DIAGNOSIS — Z880 Allergy status to penicillin: Secondary | ICD-10-CM | POA: Diagnosis not present

## 2015-11-18 HISTORY — PX: RADIOLOGY WITH ANESTHESIA: SHX6223

## 2015-11-18 LAB — COMPREHENSIVE METABOLIC PANEL
ALT: 64 U/L — ABNORMAL HIGH (ref 17–63)
AST: 90 U/L — AB (ref 15–41)
Albumin: 4.1 g/dL (ref 3.5–5.0)
Alkaline Phosphatase: 57 U/L (ref 38–126)
Anion gap: 13 (ref 5–15)
BUN: 10 mg/dL (ref 6–20)
CHLORIDE: 97 mmol/L — AB (ref 101–111)
CO2: 27 mmol/L (ref 22–32)
Calcium: 9.4 mg/dL (ref 8.9–10.3)
Creatinine, Ser: 1.16 mg/dL (ref 0.61–1.24)
GFR calc Af Amer: >60 mL/min (ref >60)
GFR calc non Af Amer: >60 mL/min (ref >60)
GLUCOSE: 140 mg/dL — AB (ref 65–99)
POTASSIUM: 3.7 mmol/L (ref 3.5–5.1)
Sodium: 137 mmol/L (ref 135–145)
Total Bilirubin: 1.2 mg/dL (ref 0.3–1.2)
Total Protein: 8 g/dL (ref 6.5–8.1)

## 2015-11-18 LAB — URINALYSIS, ROUTINE W REFLEX MICROSCOPIC
Bilirubin Urine: NEGATIVE
GLUCOSE, UA: 100 mg/dL — AB
Ketones, ur: NEGATIVE mg/dL
Leukocytes, UA: NEGATIVE
Nitrite: NEGATIVE
Specific Gravity, Urine: 1.02 (ref 1.005–1.030)
pH: 6.5 (ref 5.0–8.0)

## 2015-11-18 LAB — I-STAT TROPONIN, ED: Troponin i, poc: 0.1 ng/mL (ref 0.00–0.08)

## 2015-11-18 LAB — RAPID URINE DRUG SCREEN, HOSP PERFORMED
AMPHETAMINES: NOT DETECTED
BARBITURATES: NOT DETECTED
BENZODIAZEPINES: NOT DETECTED
Cocaine: NOT DETECTED
Opiates: NOT DETECTED
TETRAHYDROCANNABINOL: POSITIVE — AB

## 2015-11-18 LAB — CBC
HCT: 48.7 % (ref 39.0–52.0)
HEMOGLOBIN: 17.4 g/dL — AB (ref 13.0–17.0)
MCH: 34.5 pg — ABNORMAL HIGH (ref 26.0–34.0)
MCHC: 35.7 g/dL (ref 30.0–36.0)
MCV: 96.6 fL (ref 78.0–100.0)
Platelets: 172 10*3/uL (ref 150–400)
RBC: 5.04 MIL/uL (ref 4.22–5.81)
RDW: 13.1 % (ref 11.5–15.5)
WBC: 7.4 10*3/uL (ref 4.0–10.5)

## 2015-11-18 LAB — CBG MONITORING, ED: GLUCOSE-CAPILLARY: 133 mg/dL — AB (ref 65–99)

## 2015-11-18 LAB — I-STAT CHEM 8, ED
BUN: 8 mg/dL (ref 6–20)
CHLORIDE: 99 mmol/L — AB (ref 101–111)
CREATININE: 1.1 mg/dL (ref 0.61–1.24)
Calcium, Ion: 1.1 mmol/L — ABNORMAL LOW (ref 1.12–1.23)
Glucose, Bld: 135 mg/dL — ABNORMAL HIGH (ref 65–99)
HEMATOCRIT: 54 % — AB (ref 39.0–52.0)
HEMOGLOBIN: 18.4 g/dL — AB (ref 13.0–17.0)
Potassium: 3.7 mmol/L (ref 3.5–5.1)
Sodium: 138 mmol/L (ref 135–145)
TCO2: 26 mmol/L (ref 0–100)

## 2015-11-18 LAB — URINE MICROSCOPIC-ADD ON

## 2015-11-18 LAB — DIFFERENTIAL
BASOS PCT: 0 %
Basophils Absolute: 0 10*3/uL (ref 0.0–0.1)
EOS ABS: 0.1 10*3/uL (ref 0.0–0.7)
Eosinophils Relative: 1 %
Lymphocytes Relative: 19 %
Lymphs Abs: 1.4 10*3/uL (ref 0.7–4.0)
Monocytes Absolute: 0.5 10*3/uL (ref 0.1–1.0)
Monocytes Relative: 7 %
NEUTROS PCT: 73 %
Neutro Abs: 5.4 10*3/uL (ref 1.7–7.7)

## 2015-11-18 LAB — ETHANOL: Alcohol, Ethyl (B): <5 mg/dL (ref <5)

## 2015-11-18 LAB — PROTIME-INR
INR: 0.98 (ref 0.00–1.49)
Prothrombin Time: 13.2 seconds (ref 11.6–15.2)

## 2015-11-18 LAB — MRSA PCR SCREENING: MRSA by PCR: NEGATIVE

## 2015-11-18 LAB — APTT: APTT: 29 s (ref 24–37)

## 2015-11-18 SURGERY — RADIOLOGY WITH ANESTHESIA
Anesthesia: General

## 2015-11-18 MED ORDER — ONDANSETRON HCL 4 MG/2ML IJ SOLN
INTRAMUSCULAR | Status: DC | PRN
  Administered 2015-11-18: 4 mg via INTRAVENOUS

## 2015-11-18 MED ORDER — SODIUM CHLORIDE 0.9 % IV SOLN
INTRAVENOUS | Status: DC | PRN
  Administered 2015-11-18: 15:00:00 via INTRAVENOUS

## 2015-11-18 MED ORDER — LABETALOL HCL 5 MG/ML IV SOLN
10.0000 mg | Freq: Once | INTRAVENOUS | Status: AC
  Administered 2015-11-18: 10 mg via INTRAVENOUS

## 2015-11-18 MED ORDER — PANTOPRAZOLE SODIUM 40 MG IV SOLR
40.0000 mg | Freq: Every day | INTRAVENOUS | Status: DC
  Administered 2015-11-18 – 2015-11-22 (×5): 40 mg via INTRAVENOUS
  Filled 2015-11-18 (×5): qty 40

## 2015-11-18 MED ORDER — ALTEPLASE 100 MG IV SOLR
INTRAVENOUS | Status: AC
  Administered 2015-11-18: 81 mg via INTRAVENOUS
  Filled 2015-11-18: qty 100

## 2015-11-18 MED ORDER — ONDANSETRON HCL 4 MG/2ML IJ SOLN
4.0000 mg | INTRAMUSCULAR | Status: DC | PRN
  Administered 2015-11-18: 4 mg via INTRAVENOUS

## 2015-11-18 MED ORDER — LORAZEPAM 2 MG/ML IJ SOLN
0.5000 mg | Freq: Once | INTRAMUSCULAR | Status: AC
  Administered 2015-11-18: 0.5 mg via INTRAVENOUS
  Filled 2015-11-18: qty 1

## 2015-11-18 MED ORDER — ONDANSETRON HCL 4 MG/2ML IJ SOLN
INTRAMUSCULAR | Status: AC
Start: 2015-11-18 — End: 2015-11-18
  Filled 2015-11-18: qty 2

## 2015-11-18 MED ORDER — LABETALOL HCL 5 MG/ML IV SOLN
INTRAVENOUS | Status: AC
  Administered 2015-11-18: 10 mg via INTRAVENOUS
  Filled 2015-11-18: qty 4

## 2015-11-18 MED ORDER — LORAZEPAM 2 MG/ML IJ SOLN: 0.0000 mg | Freq: Two times a day (BID) | INTRAMUSCULAR | Status: AC

## 2015-11-18 MED ORDER — ADULT MULTIVITAMIN W/MINERALS CH
1.0000 | ORAL_TABLET | Freq: Every day | ORAL | Status: DC
  Administered 2015-11-18 – 2015-11-23 (×4): 1 via ORAL
  Filled 2015-11-18 (×4): qty 1

## 2015-11-18 MED ORDER — IOHEXOL 350 MG/ML SOLN
75.0000 mL | Freq: Once | INTRAVENOUS | Status: AC | PRN
  Administered 2015-11-18: 75 mL via INTRAVENOUS

## 2015-11-18 MED ORDER — LORAZEPAM 2 MG/ML IJ SOLN
1.0000 mg | Freq: Once | INTRAMUSCULAR | Status: AC
  Administered 2015-11-18: 1 mg via INTRAVENOUS
  Filled 2015-11-18: qty 1

## 2015-11-18 MED ORDER — THIAMINE HCL 100 MG/ML IJ SOLN
100.0000 mg | Freq: Every day | INTRAMUSCULAR | Status: DC
  Administered 2015-11-18 – 2015-11-21 (×4): 100 mg via INTRAVENOUS
  Filled 2015-11-18 (×4): qty 2

## 2015-11-18 MED ORDER — STROKE: EARLY STAGES OF RECOVERY BOOK
Freq: Once | Status: AC
  Administered 2015-11-18: 18:00:00
  Filled 2015-11-18: qty 1

## 2015-11-18 MED ORDER — NICARDIPINE HCL IN NACL 20-0.86 MG/200ML-% IV SOLN
3.0000 mg/h | INTRAVENOUS | Status: DC
  Administered 2015-11-18: 16:00:00 via INTRAVENOUS
  Administered 2015-11-18: 12.5 mg/h via INTRAVENOUS
  Administered 2015-11-18: 15 mg/h via INTRAVENOUS
  Administered 2015-11-19: 7 mg/h via INTRAVENOUS
  Administered 2015-11-19: 5 mg/h via INTRAVENOUS
  Administered 2015-11-19: 10 mg/h via INTRAVENOUS
  Administered 2015-11-19: 7 mg/h via INTRAVENOUS
  Administered 2015-11-19: 7.5 mg/h via INTRAVENOUS
  Administered 2015-11-20 (×3): 10 mg/h via INTRAVENOUS
  Administered 2015-11-20: 11 mg/h via INTRAVENOUS
  Administered 2015-11-21 (×2): 5 mg/h via INTRAVENOUS
  Administered 2015-11-21 (×2): 7.5 mg/h via INTRAVENOUS
  Administered 2015-11-21: 3 mg/h via INTRAVENOUS
  Administered 2015-11-21: 2.5 mg/h via INTRAVENOUS
  Filled 2015-11-18 (×17): qty 200

## 2015-11-18 MED ORDER — IOHEXOL 300 MG/ML  SOLN
200.0000 mL | Freq: Once | INTRAMUSCULAR | Status: AC | PRN
  Administered 2015-11-18: 75 mL via INTRAVENOUS

## 2015-11-18 MED ORDER — ALTEPLASE 100 MG IV SOLR
INTRAVENOUS | Status: AC
  Filled 2015-11-18: qty 100

## 2015-11-18 MED ORDER — NICARDIPINE HCL IN NACL 20-0.86 MG/200ML-% IV SOLN
3.0000 mg/h | Freq: Once | INTRAVENOUS | Status: AC
  Administered 2015-11-18: 5 mg/h via INTRAVENOUS
  Filled 2015-11-18: qty 200

## 2015-11-18 MED ORDER — ALTEPLASE (STROKE) FULL DOSE INFUSION
0.9000 mg/kg | Freq: Once | INTRAVENOUS | Status: AC
  Administered 2015-11-18: 81 mg via INTRAVENOUS

## 2015-11-18 MED ORDER — INFLUENZA VAC SPLIT QUAD 0.5 ML IM SUSY
0.5000 mL | PREFILLED_SYRINGE | INTRAMUSCULAR | Status: AC
  Administered 2015-11-19: 0.5 mL via INTRAMUSCULAR
  Filled 2015-11-18: qty 0.5

## 2015-11-18 MED ORDER — LABETALOL HCL 5 MG/ML IV SOLN
20.0000 mg | Freq: Once | INTRAVENOUS | Status: AC
  Administered 2015-11-18: 20 mg via INTRAVENOUS
  Filled 2015-11-18: qty 4

## 2015-11-18 MED ORDER — LORAZEPAM 1 MG PO TABS: 1.0000 mg | ORAL_TABLET | Freq: Four times a day (QID) | ORAL | Status: AC | PRN

## 2015-11-18 MED ORDER — SODIUM CHLORIDE 0.9 % IV SOLN: 50.0000 mL | Freq: Once | INTRAVENOUS | Status: AC

## 2015-11-18 MED ORDER — SODIUM CHLORIDE 0.9 % IV SOLN: INTRAVENOUS | Status: DC

## 2015-11-18 MED ORDER — LIDOCAINE HCL (PF) 1 % IJ SOLN
INTRAMUSCULAR | Status: AC
  Filled 2015-11-18: qty 30

## 2015-11-18 MED ORDER — PROMETHAZINE HCL 25 MG/ML IJ SOLN: 6.2500 mg | INTRAMUSCULAR | Status: DC | PRN

## 2015-11-18 MED ORDER — ACETAMINOPHEN 325 MG PO TABS
650.0000 mg | ORAL_TABLET | ORAL | Status: DC | PRN
  Administered 2015-11-20: 650 mg via ORAL
  Filled 2015-11-18: qty 2

## 2015-11-18 MED ORDER — VITAMIN B-1 100 MG PO TABS
100.0000 mg | ORAL_TABLET | Freq: Every day | ORAL | Status: DC
  Administered 2015-11-22 – 2015-11-23 (×2): 100 mg via ORAL
  Filled 2015-11-18 (×2): qty 1

## 2015-11-18 MED ORDER — CETYLPYRIDINIUM CHLORIDE 0.05 % MT LIQD
7.0000 mL | Freq: Two times a day (BID) | OROMUCOSAL | Status: DC
  Administered 2015-11-18 – 2015-11-23 (×10): 7 mL via OROMUCOSAL

## 2015-11-18 MED ORDER — LABETALOL HCL 5 MG/ML IV SOLN
10.0000 mg | INTRAVENOUS | Status: DC | PRN
  Administered 2015-11-20 (×4): 10 mg via INTRAVENOUS
  Filled 2015-11-18 (×4): qty 4

## 2015-11-18 MED ORDER — LABETALOL HCL 5 MG/ML IV SOLN
INTRAVENOUS | Status: AC
  Filled 2015-11-18: qty 4

## 2015-11-18 MED ORDER — FOLIC ACID 1 MG PO TABS
1.0000 mg | ORAL_TABLET | Freq: Every day | ORAL | Status: DC
  Administered 2015-11-18 – 2015-11-23 (×5): 1 mg via ORAL
  Filled 2015-11-18 (×5): qty 1

## 2015-11-18 MED ORDER — SODIUM CHLORIDE 0.9 % IV SOLN
INTRAVENOUS | Status: DC
  Administered 2015-11-18: 17:00:00 via INTRAVENOUS

## 2015-11-18 MED ORDER — ACETAMINOPHEN 650 MG RE SUPP: 650.0000 mg | RECTAL | Status: DC | PRN

## 2015-11-18 MED ORDER — LORAZEPAM 2 MG/ML IJ SOLN
0.0000 mg | Freq: Four times a day (QID) | INTRAMUSCULAR | Status: AC
  Administered 2015-11-19: 1 mg via INTRAVENOUS
  Administered 2015-11-20: 2 mg via INTRAVENOUS
  Filled 2015-11-18 (×3): qty 1

## 2015-11-18 MED ORDER — LORAZEPAM 2 MG/ML IJ SOLN
1.0000 mg | Freq: Four times a day (QID) | INTRAMUSCULAR | Status: AC | PRN
  Administered 2015-11-18: 1 mg via INTRAVENOUS
  Filled 2015-11-18: qty 1

## 2015-11-18 NOTE — Anesthesia Preprocedure Evaluation (Addendum)
Anesthesia Evaluation  Patient identified by MRN, date of birth, ID band Patient awake    Reviewed: Allergy & Precautions, NPO status , [name] Chart, lab work & pertinent test results  Airway Mallampati: II  TM Distance: >3 FB Neck ROM: Full    Dental no notable dental hx. (+) Poor Dentition, Dental Advisory Given   Pulmonary Current Smoker,    Pulmonary exam normal breath sounds clear to auscultation       Cardiovascular hypertension, Pt. on medications Normal cardiovascular exam Rhythm:Regular Rate:Normal  Uncontrolled HTN   Neuro/Psych CVA negative psych ROS   GI/Hepatic negative GI ROS, Neg liver ROS,   Endo/Other  negative endocrine ROS  Renal/GU negative Renal ROS  negative genitourinary   Musculoskeletal negative musculoskeletal ROS (+)   Abdominal   Peds negative pediatric ROS (+)  Hematology negative hematology ROS (+)   Anesthesia Other Findings   Reproductive/Obstetrics negative OB ROS                         Anesthesia Physical Anesthesia Plan  ASA: III and emergent  Anesthesia Plan: MAC   Post-op Pain Management:    Induction: Intravenous  Airway Management Planned: Nasal Cannula  Additional Equipment:   Intra-op Plan:   Post-operative Plan:   Informed Consent: I have reviewed the patients History and Physical, chart, labs and discussed the procedure including the risks, benefits and alternatives for the proposed anesthesia with the patient or authorized representative who has indicated his/her understanding and acceptance.   Dental advisory given  Plan Discussed with: CRNA and Surgeon  Anesthesia Plan Comments:        Anesthesia Quick Evaluation

## 2015-11-18 NOTE — ED Notes (Signed)
Called [facility] to page [facility] Hospitalist at [facility] for Dr. Rogene Houston.

## 2015-11-18 NOTE — ED Provider Notes (Signed)
CSN: [id number]     Arrival date & time 11/18/15  1047 History  By signing my name below, I, Hilda Lias, attest that this documentation has been prepared under the direction and in the presence of Fredia Sorrow, MD. Electronically Signed: Hilda Lias, ED Scribe. 11/18/2015. 11:16 AM.    Chief Complaint  Patient presents with  . Code Stroke    The history is provided by the patient. No language interpreter was used.   HPI Comments: Taylor Obrien is a 49 y.o. male who presents to the Emergency Department complaining of constant weakness to his left leg and left arm that has been present since this morning around 0900. Pt reports he was normal when he woke up this morning, and states he was sitting on his computer at home when he began having weakness in his left arm and leg and dizziness. Pt denies any trouble speaking, hx of strokes in the past, and states he is not taking blood thinners.   [name] family reports patient with a history of heavy drinking. No history of any bleeding. No history of any prior strokes. Patient not on any blood thinners.  Past Medical History  Diagnosis Date  . Essential hypertension, benign   . Hyperlipidemia   . Depression   . Chronic pain   . GERD (gastroesophageal reflux disease)    Past Surgical History  Procedure Laterality Date  . Right rotator cuff repair    . Closed reduction mandibular fracture w/ arch bars      + multiple extractions  . Removal foreign body right shoulder    . Condyloma resection     Family History  Problem Relation Age of Onset  . Diabetes Mother   . Hypertension Mother   . Diabetes Father   . Hypertension Father   . Diabetes Brother   . Hypertension Brother    Social History  Substance Use Topics  . Smoking status: Current Every Day Smoker -- 0.50 packs/day for 30 years    Types: Cigarettes  . Smokeless tobacco: Never Used  . Alcohol Use: 0.0 oz/week     Comment: daily-per patiet    Review of Systems   Constitutional: Negative for fever.  HENT: Negative for congestion.   Eyes: Negative for visual disturbance.  Respiratory: Negative for shortness of breath.   Cardiovascular: Negative for chest pain.  Gastrointestinal: Negative for nausea, vomiting and abdominal pain.  Genitourinary: Negative for dysuria.  Musculoskeletal: Negative for back pain.  Skin: Negative for rash.  Neurological: Positive for dizziness and weakness. Negative for facial asymmetry, speech difficulty and headaches.  Hematological: Does not bruise/bleed easily.  Psychiatric/Behavioral: Negative for confusion.  All other systems reviewed and are negative.     Allergies  Oxcarbazepine  Home Medications   Prior to Admission medications   Medication Sig Start Date End Date Taking? Authorizing Provider  acetaminophen (TYLENOL) 500 MG tablet Take 1,000 mg by mouth every 6 (six) hours as needed for pain.    Historical Provider, MD  amLODipine (NORVASC) 10 MG tablet Take 1 tablet (10 mg total) by mouth daily. 10/03/14   Lily Kocher, PA-C  ciprofloxacin (CIPRO) 500 MG tablet Take 1 tablet (500 mg total) by mouth 2 (two) times daily. 09/22/15   Nat Christen, MD  hydrochlorothiazide (HYDRODIURIL) 25 MG tablet Take 1 tablet (25 mg total) by mouth daily. 10/03/14   Lily Kocher, PA-C   BP 156/106 mmHg  Pulse 83  Temp(Src) 98.6 F (37 C) (Oral)  Resp 18  Ht 5\' 5"  (1.651 m)  Wt 89.812 kg  BMI 32.95 kg/m2  SpO2 97% Physical Exam  Constitutional: He is oriented to person, place, and time. He appears well-developed and well-nourished. No distress.  HENT:  Head: Normocephalic and atraumatic.  Mouth/Throat: Oropharynx is clear and moist.  Eyes: Conjunctivae and EOM are normal. Pupils are equal, round, and reactive to light.  Neck: Normal range of motion. Neck supple.  Cardiovascular: Normal rate, regular rhythm and normal heart sounds.   No murmur heard. Pulmonary/Chest: Effort normal and breath sounds normal. No  respiratory distress.  Abdominal: Soft. Bowel sounds are normal. There is no tenderness.  Musculoskeletal: Normal range of motion.  Neurological: He is alert and oriented to person, place, and time.  Patient with significant weakness in the left leg and some heaviness in partial weakness in the left arm. No facial droop no visual changes no speech problems.  Skin: Skin is warm. No erythema.  Nursing note and vitals reviewed.   ED Course  Procedures (including critical care time)  DIAGNOSTIC STUDIES: Oxygen Saturation is 100% on room air, normal by my interpretation.    COORDINATION OF CARE: 11:02 AM Discussed treatment plan with pt at bedside and pt agreed to plan.   Labs Review Labs Reviewed  CBC - Abnormal; Notable for the following:    Hemoglobin 17.4 (*)    MCH 34.5 (*)    All other components within normal limits  COMPREHENSIVE METABOLIC PANEL - Abnormal; Notable for the following:    Chloride 97 (*)    Glucose, Bld 140 (*)    AST 90 (*)    ALT 64 (*)    All other components within normal limits  URINALYSIS, ROUTINE W REFLEX MICROSCOPIC (NOT AT [facility]) - Abnormal; Notable for the following:    Glucose, UA 100 (*)    Hgb urine dipstick SMALL (*)    Protein, ur >300 (*)    All other components within normal limits  URINE MICROSCOPIC-ADD ON - Abnormal; Notable for the following:    Squamous Epithelial / LPF 0-5 (*)    Bacteria, UA FEW (*)    All other components within normal limits  I-STAT CHEM 8, ED - Abnormal; Notable for the following:    Chloride 99 (*)    Glucose, Bld 135 (*)    Calcium, Ion 1.10 (*)    Hemoglobin 18.4 (*)    HCT 54.0 (*)    All other components within normal limits  I-STAT TROPOININ, ED - Abnormal; Notable for the following:    Troponin i, poc 0.10 (*)    All other components within normal limits  CBG MONITORING, ED - Abnormal; Notable for the following:    Glucose-Capillary 133 (*)    All other components within normal limits  ETHANOL   PROTIME-INR  APTT  DIFFERENTIAL  URINE RAPID DRUG SCREEN, HOSP PERFORMED    Imaging Review Ct Head Wo Contrast  11/18/2015  CLINICAL DATA:  One day history of left-sided weakness EXAM: CT HEAD WITHOUT CONTRAST TECHNIQUE: Contiguous axial images were obtained from the base of the skull through the vertex without intravenous contrast. COMPARISON:  June 07, 2013 FINDINGS: The ventricles are normal in size and configuration. There is no intracranial mass, hemorrhage, extra-axial fluid collection, or midline shift. There is a small focus of decreased attenuation in the midline of the upper pons, possibly a small brainstem infarct. There is minimal small vessel disease adjacent to the frontal horns of each lateral ventricle. Elsewhere gray-white compartments  appear normal. Bony calvarium appears intact. The visualized mastoid air cells are clear. No intraorbital lesions are identified. IMPRESSION: Small focus of decreased attenuation in the midline of the upper pons, not present previously. A small brainstem infarct must be of concern given this appearance. Elsewhere gray-white compartments appear normal except for rather minimal small vessel disease adjacent to the frontal horns of the lateral ventricles. No edema is appreciable. No hemorrhage. Critical Value/emergent results were called by telephone at the time of interpretation on 11/18/2015 at 11:20 am to Dr. Fredia Sorrow , who verbally acknowledged these results. Electronically Signed   By: Lowella Grip III M.D.   On: 11/18/2015 11:21   I have personally reviewed and evaluated these images and lab results as part of my medical decision-making.   EKG Interpretation   Date/Time:  Saturday November 18 2015 11:00:01 EST Ventricular Rate:  102 PR Interval:  174 QRS Duration: 105 QT Interval:  361 QTC Calculation: 470 R Axis:   -3 Text Interpretation:  Sinus tachycardia Biatrial enlargement LVH with  secondary repolarization abnormality  Anterior ST elevation, probably due  to LVH Confirmed by Galen Malkowski  MD, Nikkita Adeyemi [id number]) on 11/18/2015 11:03:54 AM         CRITICAL CARE Performed by: Fredia Sorrow Total critical care time: 90 minutes Critical care time was exclusive of separately billable procedures and treating other patients. Critical care was necessary to treat or prevent imminent or life-threatening deterioration. Critical care was time spent personally by me on the following activities: development of treatment plan with patient and/or surrogate as well as nursing, discussions with consultants, evaluation of patient's response to treatment, examination of patient, obtaining history from patient or surrogate, ordering and performing treatments and interventions, ordering and review of laboratory studies, ordering and review of radiographic studies, pulse oximetry and re-evaluation of patient's condition.  MDM   Final diagnoses:  CVA (cerebral infarction)    The patient presented with acute onset stroke symptoms at 9:00 this morning. Patient was awake sitting at a computer table slumped over sudden weakness in left leg and heaviness and some weakness left arm. No headache no speech problems did have some dizziness. No visual problems.  Patient was recognized as potential stroke. NH the was 6 to start with. Code stroke called. Interviewed by specialist on-call neurologic team. Recommended TPA. [name] initial blood pressures were 220/125. Patient received labetalol 20 mg initially and then followed up with 10 mg several times without significant improvement in the diastolic. The systolic did come down below 185. They recommended that the diastolic be at [id number]. So started on a Cardene drip. It eventually did get his diastolic down to [id number]. TPA was started. Once the blood pressure was down. Shortly after TPA started patient started to develop left facial droop and a little bit of occasional slurred speech. Also some numbness  which increased his NIH scale to 12. Patient without any severe headache. All symptoms remained on the left side.   Patient was arranged for transfer to the neuro ICU at [place] discussed with neural hospitalist Dr. Leonel Ramsay. He recommended getting a CT angiogram of head and neck while [facility] was coming to get the patient. This has been ordered. [name] blood pressure currently is 156/106.    I personally performed the services described in this documentation, which was scribed in my presence. The recorded information has been reviewed and is accurate.       Fredia Sorrow, MD 11/18/15 1314

## 2015-11-18 NOTE — Addendum Note (Signed)
Addendum  created 11/18/15 1658 by Myrtie Soman, MD   Modules edited: Clinical Notes   Clinical Notes:  File: [id number]

## 2015-11-18 NOTE — Anesthesia Postprocedure Evaluation (Signed)
Anesthesia Post Note  Patient: Taylor Obrien  Procedure(s) Performed: Procedure(s) (LRB): RADIOLOGY WITH ANESTHESIA (N/A)  Patient location during evaluation: ICU Anesthesia Type: MAC Level of consciousness: awake and awake and alert Pain management: satisfactory to patient Vital Signs Assessment: post-procedure vital signs reviewed and stable Respiratory status: spontaneous breathing and patient connected to nasal cannula oxygen Cardiovascular status: blood pressure returned to baseline and stable Postop Assessment: no headache and no backache Anesthetic complications: no    Last Vitals:  Filed Vitals:   11/18/15 1445 11/18/15 1500  BP: 162/107 150/100  Pulse: 88 92  Temp:    Resp: 21 18    Last Pain:  Filed Vitals:   11/18/15 1543  PainSc: 0-No pain                 Patria Warzecha

## 2015-11-18 NOTE — H&P (Addendum)
Neurology H&P  CC: Stroke  History is obtained from:patient   HPI: Taylor Obrien is a 49 y.o. male with a history of htn who presents with acute onset left sided weakness that started right at 9am. He was evaluated at [facility] and had a CTA which shows an M2 occlusion. He has a continued NIH of 9 after tpa and has a potentially intervenable lesion and therefore I have discussed with Dr. Estanislado Pandy who will perform angiogram to see if he is able to intervene.    LKW: 9am tpa given?: yes  ROS: A 14 point ROS was performed and is negative except as noted in the HPI.    Past Medical History  Diagnosis Date  . Essential hypertension, benign   . Hyperlipidemia   . Depression   . Chronic pain   . GERD (gastroesophageal reflux disease)      Family History  Problem Relation Age of Onset  . Diabetes Mother   . Hypertension Mother   . Diabetes Father   . Hypertension Father   . Diabetes Brother   . Hypertension Brother      Social History:  reports that he has been smoking Cigarettes.  He has a 15 pack-year smoking history. He has never used smokeless tobacco. He reports that he drinks alcohol. He reports that he uses illicit drugs (Marijuana).   Exam: Current vital signs: BP 159/101 mmHg  Pulse 85  Temp(Src) 98.6 F (37 C) (Oral)  Resp 18  Ht 5\' 5"  (1.651 m)  Wt 89.812 kg (198 lb)  BMI 32.95 kg/m2  SpO2 96% Vital signs in last 24 hours: Temp:  [98.6 F (37 C)] 98.6 F (37 C) (12/10 1059) Pulse Rate:  [72-104] 85 (12/10 1328) Resp:  [16-20] 18 (12/10 1328) BP: (156-220)/(101-145) 159/101 mmHg (12/10 1328) SpO2:  [95 %-100 %] 96 % (12/10 1328) Weight:  [89.812 kg (198 lb)] 89.812 kg (198 lb) (12/10 1059)  Physical Exam  Constitutional: Appears well-developed and well-nourished.  Psych: Affect appropriate to situation Eyes: No scleral injection HENT: No OP obstrucion Head: Normocephalic.  Cardiovascular: Normal rate and regular rhythm.  Respiratory: Effort  normal and breath sounds normal to anterior ascultation GI: Soft.  No distension. There is no tenderness.  Skin: WDI  Neuro: Mental Status: Patient is awake, alert, oriented to person, place, month, year, and situation. Patient is able to give a clear and coherent history. No signs of aphasia or neglect Cranial Nerves: II: Visual Fields are full. Pupils are equal, round, and reactive to light.   III,IV, VI: EOMI without ptosis or diploplia.  V: Facial sensation is symmetric to temperature VII: Facial movement is mildly decerased on the left wwith mild dysarthria.  VIII: hearing is intact to voice X: Uvula elevates symmetrically XI: Shoulder shrug is symmetric. XII: tongue is midline without atrophy or fasciculations.  Motor: Tone is normal. Bulk is normal. 5/5 strength on the right, no movemetn left leg, he is able to move his left arm distally, but no effort vs gravity proximally  Sensory: Sensation is symmetric to light touch and temperature in the arms and legs. Deep Tendon Reflexes: 2+ and symmetric in the biceps and patellae.  Cerebellar: FNF and HKS are intact on the right.     I have reviewed labs in epic and the results pertinent to this consultation are: Cbc unremarkable cmp - mildly elevated transaminases.   I have reviewed the images obtained:CTA - decreased perfusion in teh anterior division.   Impression: 49  yo M with right MCA branch infarct. He is receiving IV tpa and going for angio to determine if the proximal branch occlusion is intervenable.   Recommendations: 1. HgbA1c, fasting lipid panel 2. To IR for intervention.  3. Frequent neuro checks 4. Echocardiogram 5. Prophylactic therapy-Antiplatelet med: none for 24 hours.  6. Risk factor modification 7. Telemetry monitoring 8. PT consult, OT consult, Speech consult    Roland Rack, MD [facility] [phone]  If 7pm- 7am, please page neurology on call as listed in  [place].  Outside of [place] tpa window and M2 vessel too small for mechanical intervention, therefore no intervention was possible. He also has a drinking history, with a 40 oz beer every day,  will start CIWA.  Roland Rack, MD [facility] [phone]  If 7pm- 7am, please page neurology on call as listed in [place].

## 2015-11-18 NOTE — Transfer of Care (Signed)
Immediate Anesthesia Transfer of Care Note  Patient: Taylor Obrien  Procedure(s) Performed: Procedure(s): RADIOLOGY WITH ANESTHESIA (N/A)  Patient Location: ICU  Anesthesia Type:MAC  Level of Consciousness: awake and alert   Airway & Oxygen Therapy: Patient Spontanous Breathing and Patient connected to nasal cannula oxygen  Post-op Assessment: Report given to RN and Post -op Vital signs reviewed and stable  Post vital signs: Reviewed and stable  Last Vitals:  Filed Vitals:   11/18/15 1445 11/18/15 1500  BP: 162/107 150/100  Pulse: 88 92  Temp:    Resp: 21 18    Complications: No apparent anesthesia complications

## 2015-11-18 NOTE — ED Notes (Signed)
Patient and family talking to determine if he would like to receive TPA.

## 2015-11-18 NOTE — ED Notes (Addendum)
Patient no longer has sensation in left leg. Patient unable to lift left arm but moves fingers. Weak grip.

## 2015-11-18 NOTE — Progress Notes (Signed)
CODE STROKE, Beeper  10:57 am  , Call from ED 10 :59, Scan begun @ 11:06.  11:12 called [facility] radiology spoke with O'Donnell.

## 2015-11-18 NOTE — ED Notes (Signed)
PT reports was sitting at the computer at home and had sudden onset of dizziness, heaviness in left arm, and unable to move left leg at 9am.

## 2015-11-18 NOTE — ED Notes (Signed)
Administering labetalol to bring blood pressure to 185/110 before TPA can be started.

## 2015-11-18 NOTE — ED Notes (Signed)
Received Faxed report from [facility] and given to EDP.

## 2015-11-18 NOTE — Procedures (Addendum)
S/P 4 vessel cerebral arteriogram. RT CFA approach. 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

## 2015-11-19 ENCOUNTER — Inpatient Hospital Stay (HOSPITAL_COMMUNITY): Payer: Medicaid Other

## 2015-11-19 DIAGNOSIS — E785 Hyperlipidemia, unspecified: Secondary | ICD-10-CM | POA: Insufficient documentation

## 2015-11-19 DIAGNOSIS — I10 Essential (primary) hypertension: Secondary | ICD-10-CM | POA: Insufficient documentation

## 2015-11-19 DIAGNOSIS — I63031 Cerebral infarction due to thrombosis of right carotid artery: Secondary | ICD-10-CM | POA: Insufficient documentation

## 2015-11-19 DIAGNOSIS — E782 Mixed hyperlipidemia: Secondary | ICD-10-CM | POA: Insufficient documentation

## 2015-11-19 LAB — LIPID PANEL
CHOL/HDL RATIO: 6.9 ratio
CHOLESTEROL: 277 mg/dL — AB (ref 0–200)
HDL: 40 mg/dL — AB (ref >40)
LDL Cholesterol: 179 mg/dL — ABNORMAL HIGH (ref 0–99)
Triglycerides: 290 mg/dL — ABNORMAL HIGH (ref <150)
VLDL: 58 mg/dL — ABNORMAL HIGH (ref 0–40)

## 2015-11-19 MED ORDER — SODIUM BICARBONATE 8.4 % IV SOLN
INTRAVENOUS | Status: AC
  Filled 2015-11-19: qty 50

## 2015-11-19 MED ORDER — SODIUM BICARBONATE 8.4 % IV SOLN
INTRAVENOUS | Status: DC
Start: 2015-11-19 — End: 2015-11-19
  Filled 2015-11-19: qty 50

## 2015-11-19 MED ORDER — ASPIRIN 300 MG RE SUPP
300.0000 mg | Freq: Every day | RECTAL | Status: DC
  Administered 2015-11-19 – 2015-11-20 (×2): 300 mg via RECTAL
  Filled 2015-11-19 (×2): qty 1

## 2015-11-19 MED ORDER — SODIUM BICARBONATE 8.4 % IV SOLN
INTRAVENOUS | Status: AC
Start: 2015-11-19 — End: 2015-11-19
  Filled 2015-11-19: qty 50

## 2015-11-19 MED ORDER — HYDROMORPHONE HCL 1 MG/ML IJ SOLN
2.0000 mg | INTRAMUSCULAR | Status: DC | PRN
  Administered 2015-11-19 (×2): 2 mg via INTRAVENOUS
  Administered 2015-11-20: 1 mg via INTRAVENOUS
  Administered 2015-11-20 – 2015-11-22 (×5): 2 mg via INTRAVENOUS
  Filled 2015-11-19 (×8): qty 2

## 2015-11-19 NOTE — Progress Notes (Signed)
5Fr RFA sheath removed using an Exoseal device at 0850am.  Manual pressure used. Groin site soft with no signs of hematoma.  Distal DP pulse is 2+. Pressure dressing applied with sandbag.  Site review with Su Grand.

## 2015-11-19 NOTE — Progress Notes (Addendum)
STROKE TEAM PROGRESS NOTE   HISTORY Taylor Obrien is a 49 y.o. male with a history of htn who presented with acute onset left sided weakness that started right at 9am on the day of admission. He was evaluated at [facility] and had a CTA which shows an M2 occlusion. He has a continued NIH of 9 after tpa and had a lesion possibly amenable to intervention.  Therefore Dr. Estanislado Pandy performed angiogram .  Findings below.   LKW: 9am tpa given?: yes  Dr Estanislado Pandy S/P 4 vessel cerebral arteriogram. RT CFA approach. 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  Overall he lethargic and unable to discuss his condition in detail.     OBJECTIVE Temp:  [97.6 F (36.4 C)-98.6 F (37 C)] 98.3 F (36.8 C) (12/11 0800) Pulse Rate:  [58-104] 90 (12/11 0800) Cardiac Rhythm:  [-] Normal sinus rhythm (12/10 2000) Resp:  [0-25] 16 (12/11 0800) BP: (120-220)/(79-145) 138/93 mmHg (12/11 0800) SpO2:  [92 %-100 %] 100 % (12/11 0800) Arterial Line BP: (138-178)/(72-100) 159/81 mmHg (12/11 0800) Weight:  [89.812 kg (198 lb)-98.567 kg (217 lb 4.8 oz)] 98.567 kg (217 lb 4.8 oz) (12/10 1430)  CBC:   Recent Labs Lab 11/18/15 1100 11/18/15 1111  WBC 7.4  --   NEUTROABS 5.4  --   HGB 17.4* 18.4*  HCT 48.7 54.0*  MCV 96.6  --   PLT 172  --     Basic Metabolic Panel:   Recent Labs Lab 11/18/15 1100 11/18/15 1111  NA 137 138  K 3.7 3.7  CL 97* 99*  CO2 27  --   GLUCOSE 140* 135*  BUN 10 8  CREATININE 1.16 1.10  CALCIUM 9.4  --     Lipid Panel:     Component Value Date/Time   CHOL 277* 11/19/2015 0549   TRIG 290* 11/19/2015 0549   HDL 40* 11/19/2015 0549   CHOLHDL 6.9 11/19/2015 0549   VLDL 58* 11/19/2015 0549   LDLCALC 179* 11/19/2015 0549   HgbA1c: No results found for: HGBA1C Urine Drug Screen:     Component Value  Date/Time   LABOPIA NONE DETECTED 11/18/2015 1234   COCAINSCRNUR NONE DETECTED 11/18/2015 1234   LABBENZ NONE DETECTED 11/18/2015 1234   AMPHETMU NONE DETECTED 11/18/2015 1234   THCU POSITIVE* 11/18/2015 1234   LABBARB NONE DETECTED 11/18/2015 1234      IMAGING  Ct Angio Head and Neck W/cm &/or Wo Cm 11/18/2015   1. Limited by venous dominant contrast timing in the distal neck and head.  2. Anterior division right MCA M2 occlusion strongly suspected. The right right ICA and MCA M1 segment are patent.  3. Bilateral carotid bifurcation atherosclerosis without hemodynamically significant stenosis. Negative arch and CCA origins.  4. Vertebral arteries are poorly evaluated in the neck. No posterior circulation occlusion.    Ct Head Wo Contrast 11/18/2015   1. Moderate sized area of cortically based infarct in the medial right superior frontal gyrus compatible with posterior right ACA versus MCA infarct.  2. No associated hemorrhage or mass effect.  3. Otherwise stable noncontrast CT appearance of the brain.    Ct Head Wo Contrast 11/18/2015   Small focus of decreased attenuation in the midline of the upper pons, not present previously. A small brainstem infarct must be of concern given this appearance. Elsewhere gray-white compartments appear  normal except for rather minimal small vessel disease adjacent to the frontal horns of the lateral ventricles. No edema is appreciable. No hemorrhage.   S/P 4 vessel cerebral arteriogram. RT CFA approach. 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  Psych: Affect appropriate to situation Eyes: No scleral injection; PERRL Head: Normocephalic.  Cardiovascular: Normal rate and regular rhythm.  Respiratory: Effort normal and breath sounds normal to  anterior ascultation GI: Soft. No distension. There is no tenderness.  Skin: WDI Groin site bandaged and bandage is bloody.  10lb weight on site  Neuro: Mental Status: Patient is awake, alert, oriented to person, place, month, year, and situation. No signs of aphasia or neglect  Cranial Nerves: II: Pupils are equal, round, and reactive to light.  III,IV, VI: EOMI without ptosis or diploplia.  V: Facial sensation is symmetric to temperature VII: Facial movement is mildly decerased on the left with mild dysarthria.  VIII: hearing is intact to voice XI: Shoulder shrug decreased on the left. XII: tongue is midline without atrophy or fasciculations.   Motor: Tone is normal. Bulk is normal. 5/5 strength on the right, no movement left leg to command, but occasional reflexive movement; He is able to grip but does not have strength proximally.  No effort vs gravity proximally   Sensory: Sensation is decreased on the left   Cerebellar:  Not tested due to lethargy  Gait: Not tested due to line in groin and hemiplegia  ASSESSMENT/PLAN [name] is a 49 y.o. male with history of depression, ongoing tobacco use, hyperlipidemia, chronic pain, and substance abuse presenting with acute onset of left-sided weakness. He received TPA 81 mg on Saturday, 11/18/2015 at 1145.  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  Resultant  Left hemiplegia  MRI  pending  MRA  not ordered  Carotid Doppler  refer to CTA of the neck  2D Echo  pending  LDL 179  HgbA1c pending  VTE prophylaxis - SCDs Diet NPO time specified  No antithrombotic prior to admission, now on No antithrombotic - secondary to TPA.  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   Substance abuse history   Other Active Problems  Status post cerebral angiogram without intervention  Status post [facility] day # 1  CRITICAL CARE NEUROLOGY ATTENDING NOTE Patient was seen and examined by me personally. I reviewed viewed imaging studies, performed medical decision making and plan of care.  The laboratory and radiographic studies were personally reviewed by me.  ROS completed by me personally.  Only complaint is right leg pain  Assessment and plan completed by me personally and fully documented above.   Condition is critical   CT scan at 24 hours  MRI pending  ECHO pending  Stroke  workup ongoing  This patient is critically ill and at significant risk of neurological worsening, death and care requires constant monitoring of vital signs, hemodynamics,respiratory and cardiac monitoring, extensive review of multiple databases, frequent neurological assessment, discussion with family, other specialists and medical decision making of high complexity.  This critical care time does not reflect procedure time, or teaching time or supervisory time of PA/NP/Med Resident etc. but could involve care discussion time.  I spent 30 minutes of Neurocritical Care time in the care of  this patient.  SIGNED BY: Dr. Elissa Hefty      To contact Stroke Continuity provider, please refer to [email or web address] After hours, contact General Neurology

## 2015-11-19 NOTE — Progress Notes (Signed)
Restraints removed after removal of femoral sheath. Order was not discontinued at that time. Patient following commands and acting appropriately. Will continue to monitor.

## 2015-11-19 NOTE — Progress Notes (Signed)
Dr. Estanislado Pandy notified of patient's increased bleeding from groin site. Orders received to restart cardene to maintain SBP of <130-140's and to apply pressure to groin site. RN will continue to monitor patient closely.

## 2015-11-20 ENCOUNTER — Inpatient Hospital Stay (HOSPITAL_COMMUNITY): Payer: Medicaid Other

## 2015-11-20 ENCOUNTER — Encounter (HOSPITAL_COMMUNITY): Payer: Self-pay | Admitting: Interventional Radiology

## 2015-11-20 DIAGNOSIS — Z720 Tobacco use: Secondary | ICD-10-CM

## 2015-11-20 DIAGNOSIS — I10 Essential (primary) hypertension: Secondary | ICD-10-CM | POA: Insufficient documentation

## 2015-11-20 DIAGNOSIS — F329 Major depressive disorder, single episode, unspecified: Secondary | ICD-10-CM | POA: Insufficient documentation

## 2015-11-20 DIAGNOSIS — I6789 Other cerebrovascular disease: Secondary | ICD-10-CM

## 2015-11-20 DIAGNOSIS — F1010 Alcohol abuse, uncomplicated: Secondary | ICD-10-CM | POA: Insufficient documentation

## 2015-11-20 DIAGNOSIS — E785 Hyperlipidemia, unspecified: Secondary | ICD-10-CM

## 2015-11-20 DIAGNOSIS — F1210 Cannabis abuse, uncomplicated: Secondary | ICD-10-CM | POA: Insufficient documentation

## 2015-11-20 DIAGNOSIS — I63521 Cerebral infarction due to unspecified occlusion or stenosis of right anterior cerebral artery: Secondary | ICD-10-CM

## 2015-11-20 DIAGNOSIS — F32A Depression, unspecified: Secondary | ICD-10-CM | POA: Insufficient documentation

## 2015-11-20 DIAGNOSIS — G894 Chronic pain syndrome: Secondary | ICD-10-CM | POA: Insufficient documentation

## 2015-11-20 LAB — HEMOGLOBIN A1C
HEMOGLOBIN A1C: 5.8 % — AB (ref 4.8–5.6)
MEAN PLASMA GLUCOSE: 120 mg/dL

## 2015-11-20 LAB — TROPONIN I
TROPONIN I: 0.07 ng/mL — AB (ref <0.031)
TROPONIN I: 0.08 ng/mL — AB (ref <0.031)
Troponin I: 0.07 ng/mL — ABNORMAL HIGH (ref <0.031)

## 2015-11-20 MED ORDER — NICOTINE 14 MG/24HR TD PT24
14.0000 mg | MEDICATED_PATCH | Freq: Every day | TRANSDERMAL | Status: DC
  Administered 2015-11-20 – 2015-11-23 (×4): 14 mg via TRANSDERMAL
  Filled 2015-11-20 (×4): qty 1

## 2015-11-20 MED ORDER — LABETALOL HCL 5 MG/ML IV SOLN
10.0000 mg | INTRAVENOUS | Status: DC | PRN
Start: 2015-11-20 — End: 2015-11-23
  Administered 2015-11-20 – 2015-11-23 (×5): 20 mg via INTRAVENOUS
  Filled 2015-11-20 (×5): qty 4

## 2015-11-20 NOTE — Evaluation (Signed)
Occupational Therapy Evaluation Patient Details Name: Taylor Obrien MRN: [id number] DOB: February 08, 1966 Today's Date: 11/20/2015    History of Present Illness pt presents with Posterior R ACA Infarct s/p Cerebral Angiogram.  pt with hx of HTN, Depression, and Polysubstance.     Clinical Impression   Pt was independent prior to admission.  Presents with dense [name] with impaired sensation and neglect, R gaze preference, impaired cognition and poor balance with pushing to L side.  He requires +2 assist for all mobility and is dependent in self care.  Pt will need intensive rehab.  Will follow acutely.    Follow Up Recommendations  CIR;Supervision/Assistance - 24 hour    Equipment Recommendations  3 in 1 bedside comode;Tub/shower bench    Recommendations for Other Services       Precautions / Restrictions Precautions Precautions: Fall Restrictions Weight Bearing Restrictions: No      Mobility Bed Mobility Overal bed mobility: Needs Assistance;+2 for physical assistance Bed Mobility: Supine to Sit     Supine to sit: Max assist;+2 for physical assistance;HOB elevated     General bed mobility comments: pt does attempt to participate in bed mobility with R UE, but needs A for L UE and LE and trunk.  pt strong pusher with R UE and trunk.    Transfers Overall transfer level: Needs assistance Equipment used: 2 person hand held assist Transfers: Sit to/from [facility] Sit to Stand: Max assist;+2 physical assistance Stand pivot transfers: Total assist;+2 physical assistance       General transfer comment: pt needs A positioning Bil LEs prior to attempting to stand.  Blocking Bil LEs and strong facilitation for weightshifting towards R side as pt continues to be strong pusher to L side.  Total A to complete transfer towards R side.      Balance Overall balance assessment: Needs assistance Sitting-balance support: Single extremity supported;No upper  extremity supported;Feet supported Sitting balance-Leahy Scale: Poor Sitting balance - Comments: pt strong pusher towards L side.  Needs consistent cues and facilitation for weightshifting towards R and reaching R UE towards R side.  Worked on propping on R elbow while sitting.   Postural control: Left lateral lean Standing balance support: During functional activity Standing balance-Leahy Scale: Zero                              ADL Overall ADL's : Needs assistance/impaired Eating/Feeding: Minimal assistance;Sitting Eating/Feeding Details (indicate cue type and reason): used cup, spoon and finger fed Grooming: Wash/dry face;Set up;Sitting   Upper Body Bathing: Maximal assistance;Bed level   Lower Body Bathing: Total assistance;Bed level   Upper Body Dressing : Maximal assistance;Sitting   Lower Body Dressing: Total assistance;Bed level   Toilet Transfer: +2 for physical assistance;Maximal assistance;Stand-pivot;BSC   Toileting- Clothing Manipulation and Hygiene: +2 for physical assistance;Total assistance;Sit to/from stand               Vision Vision Assessment?: Yes Eye Alignment: Within Functional Limits Ocular Range of Motion: Within Functional Limits (grossly able to follow OTs face ) Alignment/Gaze Preference: Gaze right Visual Fields: Left visual field deficit Additional Comments: able to locate food items L of midline with maximum verbal cues   Perception     Praxis      Pertinent Vitals/Pain Pain Assessment: No/denies pain Pain Score: 0-No pain     Hand Dominance Right   Extremity/Trunk Assessment Upper Extremity Assessment Upper Extremity Assessment: [name]  deficits/detail [name] Deficits / Details: gross grasp only, no other voluntary movement, L neglect [name] Sensation: decreased light touch;decreased proprioception [name] Coordination: decreased fine motor;decreased gross motor   Lower Extremity Assessment Lower Extremity Assessment: Defer to  PT evaluation [name] Deficits / Details: No active movement noted.  pt flaccid in LE.  Sensation diminished, but does have some sense of pain and ? proprioception.   [name] Sensation: decreased light touch;decreased proprioception [name] Coordination: decreased fine motor;decreased gross motor   Cervical / Trunk Assessment Cervical / Trunk Assessment: Normal   Communication Communication Communication: Other (comment) (low volume)   Cognition Arousal/Alertness: Awake/alert Behavior During Therapy: WFL for tasks assessed/performed Overall Cognitive Status: Impaired/Different from baseline Area of Impairment: Attention;Following commands;Safety/judgement   Current Attention Level: Sustained   Following Commands: Follows one step commands with increased time (and multimodal cues) Safety/Judgement: Decreased awareness of safety;Decreased awareness of deficits     General Comments: pt with L sided Neglect and strong gaze preference to R side.  pt with decreased awareness of L sided weakness and safety with mobility.     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  Lives With:  (4 children)    Prior Functioning/Environment Level of Independence: Independent             OT Diagnosis: Generalized weakness;Cognitive deficits;Hemiplegia non-dominant side   OT Problem List: Decreased strength;Decreased activity tolerance;Impaired balance (sitting and/or  standing);Impaired vision/perception;Decreased coordination;Decreased cognition;Decreased safety awareness;Decreased knowledge of use of DME or AE;Impaired sensation;Impaired tone;Impaired UE functional use   OT Treatment/Interventions: Self-care/ADL training;Neuromuscular education;Therapeutic activities;DME and/or AE instruction;Therapeutic exercise;Visual/perceptual remediation/compensation;Patient/family education;Balance training;Cognitive remediation/compensation    OT Goals(Current goals can be found in the care plan section) Acute Rehab OT Goals Patient Stated Goal: Per wife to move well enough for her to A at home.   OT Goal Formulation: With patient Time For Goal Achievement: 12/04/15 Potential to Achieve Goals: Good ADL Goals Pt Will Perform Eating: with supervision;sitting;with set-up Pt Will Perform Grooming: with min assist;sitting Pt Will Perform Upper Body Dressing: with mod assist;sitting Pt Will Transfer to Toilet: with mod assist;stand pivot transfer;bedside commode Additional ADL Goal #1: Pt will sit EOB x 5 minutes with min guard assist in preparation for ADL. Additional ADL Goal #2: Pt will locate  ADL items R of midline with minimal verbal cues. Additional ADL Goal #3: Pt will perform bed mobility with min assist in preparation for ADL.  OT Frequency: Min 3X/week   Barriers to D/C:            Co-evaluation PT/OT/SLP Co-Evaluation/Treatment: Yes Reason for Co-Treatment: For patient/therapist safety PT goals addressed during session: Mobility/safety with mobility;Balance;Strengthening/ROM OT goals addressed during session: ADL's and self-care      End of Session Equipment Utilized During Treatment: Gait belt Nurse Communication: Mobility status;Need for lift equipment  Activity Tolerance: Patient tolerated treatment well Patient left: in chair;with call bell/phone within reach;with chair alarm set;with family/visitor present   Time: 1127-1202 OT Time  Calculation (min):  35 min Charges:  OT General Charges $OT Visit: 1 Procedure OT Evaluation $Initial OT Evaluation Tier I: 1 Procedure G-Codes:    [name] 11/20/2015, 3:00 PM  [phone]

## 2015-11-20 NOTE — Evaluation (Signed)
Physical Therapy Evaluation Patient Details Name: Taylor Obrien MRN: [id number] DOB: 14-Mar-1966 Today's Date: 11/20/2015   History of Present Illness  pt presents with Posterior R ACA Infarct s/p Cerebral Angiogram.  pt with hx of HTN, Depression, and Polysubstance.    Clinical Impression  Pt very eager for mobility and at this time requires 2 person A for all mobility.  Pt is a strong pusher towards L side and needs consistent cueing to position R UE to minimize pushing.  Pt with L sided neglect, but with consistent cueing is able to bring gaze past midline.  Feel pt would benefit from CIR at D/C to maximize independence prior to returning to home.  Will continue to follow.      Follow Up Recommendations CIR    Equipment Recommendations  None recommended by PT    Recommendations for Other Services Rehab consult     Precautions / Restrictions Precautions Precautions: Fall Restrictions Weight Bearing Restrictions: No      Mobility  Bed Mobility Overal bed mobility: Needs Assistance;+2 for physical assistance Bed Mobility: Supine to Sit     Supine to sit: Max assist;+2 for physical assistance;HOB elevated     General bed mobility comments: pt does attempt to participate in bed mobility with R UE, but needs A for L UE and LE and trunk.  pt strong pusher with R UE and trunk.    Transfers Overall transfer level: Needs assistance Equipment used: 2 person hand held assist Transfers: Sit to/from [facility] Sit to Stand: Max assist;+2 physical assistance Stand pivot transfers: Total assist;+2 physical assistance       General transfer comment: pt needs A positioning Bil LEs prior to attempting to stand.  Blocking Bil LEs and strong facilitation for weightshifting towards R side as pt continues to be strong pusher to L side.  Total A to complete transfer towards R side.    Ambulation/Gait                Stairs            Wheelchair Mobility     Modified Rankin (Stroke Patients Only) Modified Rankin (Stroke Patients Only) Pre-Morbid Rankin Score: No symptoms Modified Rankin: Severe disability     Balance Overall balance assessment: Needs assistance Sitting-balance support: Single extremity supported;No upper extremity supported;Feet supported Sitting balance-Leahy Scale: Poor Sitting balance - Comments: pt strong pusher towards L side.  Needs consistent cues and facilitation for weightshifting towards R and reaching R UE towards R side.  Worked on propping on R elbow while sitting.   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  pt flaccid in LE.  Sensation diminished, but does have some sense of pain and ? proprioception.    Cervical / Trunk Assessment: Normal  Communication   Communication:  (Speaks very quietly.)  Cognition Arousal/Alertness: Awake/alert Behavior During Therapy: WFL for tasks assessed/performed Overall Cognitive Status: Impaired/Different from baseline Area of Impairment:  Attention;Following commands;Safety/judgement   Current Attention Level: Selective   Following Commands: Follows one step commands with increased time Safety/Judgement: Decreased awareness of safety;Decreased awareness of deficits     General Comments: pt with L sided Neglect and strong gaze preference to R side.  pt with decreased awareness of L sided weakness and safety with mobility.      General Comments      Exercises        Assessment/Plan    PT Assessment Patient needs continued PT services  PT Diagnosis Difficulty walking;Hemiplegia non-dominant side   PT Problem List Decreased strength;Decreased activity tolerance;Decreased balance;Decreased mobility;Decreased coordination;Decreased cognition;Decreased knowledge of use of DME;Decreased safety awareness;Impaired sensation  PT Treatment Interventions DME instruction;Gait training;Functional mobility training;Therapeutic activities;Therapeutic exercise;Balance training;Neuromuscular re-education;Cognitive remediation;Patient/family education   PT Goals (Current goals can be found in the Care Plan section) Acute Rehab PT Goals Patient Stated Goal: Per wife to move well enough for her to A at home.   PT Goal Formulation: With patient/family Time For Goal Achievement: 12/04/15 Potential to Achieve Goals: Good    Frequency Min 4X/week   Barriers to discharge        Co-evaluation PT/OT/SLP Co-Evaluation/Treatment: Yes Reason for Co-Treatment: For patient/therapist safety PT goals addressed during session: Mobility/safety with mobility;Balance;Strengthening/ROM OT goals addressed during session: ADL's and self-care       End of Session Equipment Utilized During Treatment: Gait belt Activity Tolerance: Patient tolerated treatment well Patient left: in chair;with call bell/phone within reach;with chair alarm set;with family/visitor present Nurse Communication: Mobility status         Time: [id number] PT Time  Calculation (min) (ACUTE ONLY): 29 min   Charges:   PT Evaluation $Initial PT Evaluation Tier I: 1 Procedure     PT G CodesCatarina Hartshorn, [place] 11/20/2015, 12:21 PM

## 2015-11-20 NOTE — Evaluation (Signed)
Speech Language Pathology Evaluation Patient Details Name: Taylor Obrien MRN: [id number] DOB: April 06, 1966 Today's Date: 11/20/2015 Time: [id number] SLP Time Calculation (min) (ACUTE ONLY): 27 min  Problem List:  Patient Active Problem List   Diagnosis Date Noted  . Cerebrovascular accident (CVA) due to occlusion of right anterior cerebral artery ([place])   . Essential hypertension   . Depression   . Chronic pain syndrome   . ETOH abuse   . Marijuana abuse   . Cerebrovascular accident (CVA) due to thrombosis of right carotid artery ([place])   . Malignant hypertension   . Hyperlipidemia   . CVA (cerebral infarction) 11/18/2015  . Stroke (cerebrum) ([place]) 11/18/2015  . Chest pain 10/09/2012  . GERD (gastroesophageal reflux disease) 10/09/2012  . Chronic back pain 10/09/2012  . Tobacco abuse 10/09/2012  . Hypertensive heart disease 08/31/2012  . Accelerated hypertension 08/31/2012  . Precordial pain 08/31/2012   Past Medical History:  Past Medical History  Diagnosis Date  . Essential hypertension, benign   . Hyperlipidemia   . Depression   . Chronic pain   . GERD (gastroesophageal reflux disease)    Past Surgical History:  Past Surgical History  Procedure Laterality Date  . Right rotator cuff repair    . Closed reduction mandibular fracture w/ arch bars      + multiple extractions  . Removal foreign body right shoulder    . Condyloma resection    . Radiology with anesthesia N/A 11/18/2015    Procedure: RADIOLOGY WITH ANESTHESIA;  Surgeon: Luanne Bras, MD;  Location: [facility];  Service: Radiology;  Laterality: N/A;   HPI:  pt presents with Posterior R ACA Infarct s/p Cerebral Angiogram.  pt with hx of HTN, Depression, and Polysubstance.     Assessment / Plan / Recommendation Clinical Impression  Pt has moderate cognitive impairments in the areas of sustained attention, storage of new information, basic problem solving, and emergent/anticipatory awareness. He requires Max  cues to attend to the left side of his environment. His speech is dysarthric although primarily impacted by hypophonia. Pt will benefit from acute SLP services as well as CIR level therapy upon discharge.    SLP Assessment  Patient needs continued Speech Lanaguage Pathology Services    Follow Up Recommendations  Inpatient Rehab    Frequency and Duration min 2x/week  2 weeks      SLP Evaluation Prior Functioning  Cognitive/Linguistic Baseline: Within functional limits Type of Home: Apartment  Lives With: Spouse;Other (Comment) (4 children) Available Help at Discharge: Family;Available 24 hours/day Vocation: Full time employment   Cognition  Overall Cognitive Status: Impaired/Different from baseline Arousal/Alertness: Awake/alert Orientation Level: Oriented X4 Attention: Sustained Sustained Attention: Impaired Sustained Attention Impairment: Verbal basic;Functional basic Memory: Impaired Memory Impairment: Storage deficit;Decreased recall of new information Awareness: Impaired Awareness Impairment: Emergent impairment;Anticipatory impairment Problem Solving: Impaired Problem Solving Impairment: Verbal basic;Functional basic Behaviors: Impulsive Safety/Judgment: Impaired    Comprehension  Auditory Comprehension Overall Auditory Comprehension: Impaired Yes/No Questions: Within Functional Limits Commands: Impaired One Step Basic Commands: 75-100% accurate Two Step Basic Commands: 25-49% accurate Complex Commands: 25-49% accurate Conversation: Simple Interfering Components: Attention;Working memory;Processing speed    Expression Expression Primary Mode of Expression: Verbal Verbal Expression Overall Verbal Expression: Appears within functional limits for tasks assessed Written Expression Dominant Hand: Right   Oral / Motor Oral Motor/Sensory Function Overall Oral Motor/Sensory Function: Mild impairment Facial ROM: Reduced left;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 VII (facial)  dysfunction Facial Symmetry: Abnormal symmetry left;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 VII (facial) dysfunction Lingual ROM: Within Functional Limits  Lingual Symmetry: Within Functional Limits Lingual Strength: Within Functional Limits Mandible: Within Functional Limits Motor Speech Overall Motor Speech: Impaired Respiration: Within functional limits Phonation: Low vocal intensity Resonance: Within functional limits Articulation: Within functional limitis Intelligibility: Intelligibility reduced Conversation: 50-74% accurate Motor Planning: Witnin functional limits Effective Techniques: Increased vocal intensity    Germain Osgood, M.A. CCC-SLP [phone]  Germain Osgood 11/20/2015, 1:51 PM

## 2015-11-20 NOTE — Progress Notes (Signed)
Rehab Admissions Coordinator Note:  Patient was screened by Retta Diones for appropriateness for an Inpatient Acute Rehab Consult.  At this time, we are recommending Inpatient Rehab consult.  Retta Diones 11/20/2015, 12:27 PM  I can be reached at [phone].

## 2015-11-20 NOTE — Progress Notes (Signed)
STROKE TEAM PROGRESS NOTE   HISTORY Taylor Obrien is a 49 y.o. male with a history of htn who presented with acute onset left sided weakness that started right at 9am on the day of admission. He was evaluated at [facility] and had a CTA which shows an M2 occlusion. He has a continued NIH of 9 after tpa and had a lesion possibly amenable to intervention.  Therefore Dr. Estanislado Pandy performed angiogram .  Findings below. 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  Overall he lethargic and unable to discuss his condition in detail.     OBJECTIVE Temp:  [97.9 F (36.6 C)-98.9 F (37.2 C)] 98.9 F (37.2 C) (12/12 1200) Pulse Rate:  [76-101] 83 (12/12 1530) Cardiac Rhythm:  [-] Normal sinus rhythm (12/12 0800) Resp:  [12-27] 13 (12/12 1530) BP: (136-194)/(80-123) 155/97 mmHg (12/12 1530) SpO2:  [91 %-100 %] 96 % (12/12 1530)  CBC:   Recent Labs Lab 11/18/15 1100 11/18/15 1111  WBC 7.4  --   NEUTROABS 5.4  --   HGB 17.4* 18.4*  HCT 48.7 54.0*  MCV 96.6  --   PLT 172  --     Basic Metabolic Panel:   Recent Labs Lab 11/18/15 1100 11/18/15 1111  NA 137 138  K 3.7 3.7  CL 97* 99*  CO2 27  --   GLUCOSE 140* 135*  BUN 10 8  CREATININE 1.16 1.10  CALCIUM 9.4  --     Lipid Panel:     Component Value Date/Time   CHOL 277* 11/19/2015 0549   TRIG 290* 11/19/2015 0549   HDL 40* 11/19/2015 0549   CHOLHDL 6.9 11/19/2015 0549   VLDL 58* 11/19/2015 0549   LDLCALC 179* 11/19/2015 0549   HgbA1c:  Lab Results  Component Value Date   HGBA1C 5.8* 11/19/2015   Urine Drug Screen:     Component Value Date/Time   LABOPIA NONE DETECTED 11/18/2015 1234   COCAINSCRNUR NONE DETECTED 11/18/2015 1234   LABBENZ NONE DETECTED 11/18/2015 1234   AMPHETMU NONE DETECTED 11/18/2015 1234   THCU  POSITIVE* 11/18/2015 1234   LABBARB NONE DETECTED 11/18/2015 1234      IMAGING  Ct Angio Head and Neck W/cm &/or Wo Cm 11/18/2015   1. Limited by venous dominant contrast timing in the distal neck and head.  2. Anterior division right MCA M2 occlusion strongly suspected. The right right ICA and MCA M1 segment are patent.  3. Bilateral carotid bifurcation atherosclerosis without hemodynamically significant stenosis. Negative arch and CCA origins.  4. Vertebral arteries are poorly evaluated in the neck. No posterior circulation occlusion.    Ct Head Wo Contrast 11/18/2015   1. Moderate sized area of cortically based infarct in the medial right superior frontal gyrus compatible with posterior right ACA versus MCA infarct.  2. No associated hemorrhage or mass effect.  3. Otherwise stable noncontrast CT appearance of the brain.    Ct Head Wo Contrast 11/18/2015   Small focus of decreased attenuation in the midline of the upper pons, not present previously. A small brainstem infarct must be of concern given this appearance. Elsewhere gray-white compartments appear normal except for rather minimal small vessel disease adjacent to the frontal horns of the lateral ventricles. No edema is appreciable. No hemorrhage.   S/P 4 vessel cerebral  arteriogram. RT CFA approach. 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  Psych: Affect appropriate to situation Eyes: No scleral injection; PERRL Head: Normocephalic.  Cardiovascular: Normal rate and regular rhythm.  Respiratory: Effort normal and breath sounds normal to anterior ascultation GI: Soft. No distension. There is no tenderness.  Skin: WDI Groin site bandaged and bandage is bloody.  10lb weight on site  Neuro: Mental Status: Patient is  awake, alert, oriented to person, place, month, year, and situation. No signs of aphasia or neglect  Cranial Nerves: II: Pupils are equal, round, and reactive to light.  III,IV, VI: EOMI without ptosis or diploplia.  V: Facial sensation is symmetric to temperature VII: Facial movement is mildly decerased on the left with mild dysarthria.  VIII: hearing is intact to voice XI: Shoulder shrug decreased on the left. XII: tongue is midline without atrophy or fasciculations.   Motor: Tone is normal. Bulk is normal. 5/5 strength on the right, no movement left leg to command, but occasional reflexive movement; He is able to grip but does not have strength proximally.  No effort vs gravity proximally   Sensory: Sensation is decreased on the left   Cerebellar:  Not tested due to lethargy  Gait: Not tested due to line in groin and hemiplegia  ASSESSMENT/PLAN [name] is a 49 y.o. male with history of depression, ongoing tobacco use, hyperlipidemia, chronic pain, and substance abuse presenting with acute onset of left-sided weakness. He received TPA 81 mg on Saturday, 11/18/2015 at 1145.  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  Resultant  Left hemiplegia MRI  1. Large acute right ACA territory infarct. No associated hemorrhage or mass effect. 2. No other acute intracranial abnormality. 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 3. 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    MRA  not ordered see catheter angio  Carotid Doppler  refer to CTA of the neck  2D Echo  pending  LDL 179  HgbA1c 5.8  VTE prophylaxis - SCDs Diet NPO time specified Diet regular Room service appropriate?: Yes; Fluid consistency:: Thin  No antithrombotic prior to admission, now on No antithrombotic - secondary  to TPA.  Patient counseled to be compliant with his antithrombotic medications  Ongoing aggressive stroke risk factor management  Therapy recommendations: Pending  Disposition: Pending  Hypertension  Stable Permissive hypertension (OK if < 220/120) but gradually normalize in 5-7 days; however, currently has malignant hypertension and requires Nicardipine for BP control  Hyperlipidemia  Home meds: No lipid lowering medications prior to admission  LDL 179, goal < 70  Added Lipitor 40 mg daily (currently NPO)  Continue statin at discharge   Other Stroke Risk Factors  Cigarette smoker; will need cessation counseling  ETOH use  Obesity, Body mass index is 36.16 kg/(m^2).   Substance abuse history   Other Active Problems  Status post cerebral angiogram without intervention  Status post [facility] day # 2 I had a long discussion with the patient's wife at the bedside about his condition, plan of care and answered questions. Recommend transfer to the neurology floor bed if off IV blood pressure medications. Mobilize out of bed. Physical occupational rehabilitation consults.    Antony Contras, MD  To contact Stroke Continuity provider, please refer to [email or web address] After hours, contact General Neurology

## 2015-11-20 NOTE — Evaluation (Signed)
Clinical/Bedside Swallow Evaluation Patient Details  Name: Taylor Obrien MRN: [id number] Date of Birth: 1965-12-22  Today's Date: 11/20/2015 Time: SLP Start Time (ACUTE ONLY): 1148 SLP Stop Time (ACUTE ONLY): 1215 SLP Time Calculation (min) (ACUTE ONLY): 27 min  Past Medical History:  Past Medical History  Diagnosis Date  . Essential hypertension, benign   . Hyperlipidemia   . Depression   . Chronic pain   . GERD (gastroesophageal reflux disease)    Past Surgical History:  Past Surgical History  Procedure Laterality Date  . Right rotator cuff repair    . Closed reduction mandibular fracture w/ arch bars      + multiple extractions  . Removal foreign body right shoulder    . Condyloma resection    . Radiology with anesthesia N/A 11/18/2015    Procedure: RADIOLOGY WITH ANESTHESIA;  Surgeon: Luanne Bras, MD;  Location: [facility];  Service: Radiology;  Laterality: N/A;   HPI:  pt presents with Posterior R ACA Infarct s/p Cerebral Angiogram.  pt with hx of HTN, Depression, and Polysubstance.     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 Anterior loss is observed primarily with self-fed trials of puree, however he appears to sense the food and uses his tongue to clean his lips with Mod I. He uses a lingual sweep to clean the inside of his oral cavity as well. One instance of delayed throat clearing noted after several consecutive straw sips. Recommend to initiate a regular diet and thin liquids but with full supervision given cognitive function (see speech/language evaluation for further details) and impulsivity. SLP to f/u briefly given mild aspiration risk due to cognitive deficits.    Aspiration Risk  Mild aspiration risk    Diet Recommendation  Regular diet, thin liquids Full supervision due to cognitive deficits   Medication Administration: Whole meds with liquid    Other  Recommendations Oral  Care Recommendations: Oral care BID   Follow up Recommendations  Inpatient Rehab    Frequency and Duration min 2x/week  1 week       Prognosis        Swallow Study   General Date of Onset: 11/18/15 HPI: pt presents with Posterior R ACA Infarct s/p Cerebral Angiogram.  pt with hx of HTN, Depression, and Polysubstance.   Type of Study: Bedside Swallow Evaluation Previous Swallow Assessment: none in chart Diet Prior to this Study: NPO Temperature Spikes Noted: No Respiratory Status: Room air History of Recent Intubation: No Behavior/Cognition: Alert;Cooperative;Pleasant mood;Distractible;Requires cueing Oral Cavity Assessment: Within Functional Limits Oral Care Completed by SLP: No Vision: Functional for self-feeding (needs cues to attend to left side) Self-Feeding Abilities: Able to feed self;Needs set up Patient Positioning: Upright in chair Baseline Vocal Quality: Low vocal intensity    Oral/Motor/Sensory Function Overall Oral Motor/Sensory Function: Mild impairment Facial ROM: Reduced left;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 VII (facial) dysfunction Facial Symmetry: Abnormal symmetry left;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 VII (facial) dysfunction Lingual ROM: Within Functional Limits Lingual Symmetry: Within Functional Limits Lingual Strength: Within Functional Limits Mandible: Within Functional Limits   Ice Chips Ice chips: Not tested   Thin Liquid Thin Liquid: Impaired Presentation: Cup;Self Fed;Straw Pharyngeal  Phase Impairments: Throat Clearing - Delayed    Nectar Thick Nectar Thick Liquid: Not tested   Honey Thick Honey Thick Liquid: Not tested   Puree Puree: Impaired Presentation: Self Fed;Spoon Oral Phase Impairments: Reduced labial seal Oral Phase Functional Implications: Left anterior spillage   Solid Solid:  Within functional limits Presentation: Self Fed      Germain Osgood, M.A. CCC-SLP [phone]  Germain Osgood 11/20/2015,12:31 PM

## 2015-11-20 NOTE — Consult Note (Addendum)
Physical Medicine and Rehabilitation Consult   Reason for Consult: Left sided weakness, right gaze preference with left inattention. Referring Physician: Dr. Leonie Man.   HPI: Taylor Obrien is a 49 y.o. male with history of HTN, depression, chronic pain who was admitted via [facility] on 11/18/15 with acute onset of left sided weakness.  CTA showed M2 occlusion. He was treated with TPA and underwent cerebral angio showing occluded R-ACA proximal segment with partial  distal leptomeningeal collateralization and R-MCA without filling defects. 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 UDS positive for THC.  Neurology feels that patient with embolic stroke of unknown etiology and work up underway. Therapy evaluations initiated and patient limited by right sided weakness, left inattention with right gaze preference, lacks awareness of deficits, poor safety with strong pusher tendency and needed BLE blocked for transfers.  CIR recommended for further therapies.   Review of Systems  Eyes: Negative for blurred vision and double vision.  Respiratory: Negative for cough, sputum production and shortness of breath.   Cardiovascular: Negative for chest pain, palpitations and leg swelling.  Gastrointestinal: Positive for heartburn. Negative for nausea and vomiting.  Musculoskeletal: Positive for myalgias, back pain and joint pain (chronic right shoulder).  Skin: Negative for rash.  Neurological: Positive for focal weakness and headaches. Negative for dizziness and tingling.  Psychiatric/Behavioral: Negative for memory loss. The patient does not have insomnia.   All other systems reviewed and are negative.    Past Medical History  Diagnosis Date  . Essential hypertension, benign   . Hyperlipidemia   . Depression   . Chronic pain   . GERD  (gastroesophageal reflux disease)     Past Surgical History  Procedure Laterality Date  . Right rotator cuff repair    . Closed reduction mandibular fracture w/ arch bars      + multiple extractions  . Removal foreign body right shoulder    . Condyloma resection    . Radiology with anesthesia N/A 11/18/2015    Procedure: RADIOLOGY WITH ANESTHESIA;  Surgeon: Luanne Bras, MD;  Location: [facility];  Service: Radiology;  Laterality: N/A;    Family History  Problem Relation Age of Onset  . Diabetes Mother   . Hypertension Mother   . Diabetes Father   . Hypertension Father   . Diabetes Brother   . Hypertension Brother     Social History:  Married. Lives with wife and 4 children in a first floor apt.  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  Wife laid off couple of months ago and is available 24/7 at discharge. He reports that he has been smoking Cigarettes-1 PPD.  He has a 15 pack-year smoking history. He has never used smokeless tobacco. He reports that he drinks three 40 ounce cans of beers/daily.  He reports that he uses illicit drugs (Marijuana)---"smokes 2-3 blunts daily"    Allergies  Allergen Reactions  . Amoxicillin   . Oxcarbazepine Other (See Comments)    Patient goes out of right state of mind.   . Penicillins     Medications Prior to Admission  Medication Sig Dispense Refill  . acetaminophen (TYLENOL) 500 MG tablet Take 1,000 mg by mouth every 6 (six) hours as needed for pain.    Marland Kitchen amLODipine (NORVASC) 10 MG tablet Take 1 tablet (10 mg total) by mouth daily. 30 tablet 0  .  cloNIDine (CATAPRES) 0.3 MG tablet Take 0.3 mg by mouth at bedtime.    . hydrochlorothiazide (HYDRODIURIL) 25 MG tablet Take 1 tablet (25 mg total) by mouth daily. 30 tablet 0  . HYDROcodone-acetaminophen (NORCO) 7.5-325 MG tablet Take 1 tablet by mouth every 4 (four) hours as needed for moderate pain.      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  pt strong pusher with R UE and trunk.   Transfers Overall transfer level: Needs assistance Equipment used: 2 person hand held assist Transfers: Sit to/from Stand, Stand Pivot Transfers Sit to Stand: Max assist, +2 physical assistance Stand pivot transfers: Total assist, +2 physical assistance General transfer comment: pt needs A positioning Bil LEs prior to attempting to stand.  Blocking Bil LEs and strong facilitation for weightshifting towards R side as pt continues to be strong pusher to L side.  Total A to complete transfer towards R side.        ADL:    Cognition: Cognition Overall Cognitive Status: Impaired/Different from baseline Orientation Level: Oriented X4 Cognition Arousal/Alertness: Awake/alert Behavior During Therapy: WFL for tasks assessed/performed Overall Cognitive Status: Impaired/Different from baseline Area of Impairment: Attention, Following commands, Safety/judgement Current Attention Level: Selective Following Commands: Follows one step commands with increased time Safety/Judgement: Decreased awareness of safety, Decreased awareness of  deficits General Comments: pt with L sided Neglect and strong gaze preference to R side.  pt with decreased awareness of L sided weakness and safety with mobility.     Blood pressure 182/111, pulse 80, temperature 98.9 F (37.2 C), temperature source Oral, resp. rate 15, height 5\' 5"  (1.651 m), weight 98.567 kg (217 lb 4.8 oz), SpO2 94 %. Physical Exam  Nursing note and vitals reviewed. Constitutional: He is oriented to person, place, and time. He appears well-developed and well-nourished.  HENT:  Head: Normocephalic.  Right Ear: External ear normal.  Left Ear: External ear normal.  Foam dressing on scalp  Eyes: Pupils are equal, round, and reactive to light. Scleral icterus is present.  Neck: Normal range of motion. Neck supple.  Cardiovascular: Normal rate and regular rhythm.   Respiratory: Effort normal. No respiratory distress. He has no wheezes.  Upper airway sounds  GI: Soft. Bowel sounds are normal. He exhibits no distension. There is no tenderness.  Musculoskeletal: He exhibits no edema or tenderness.  PROM WNL  Neurological: He is alert and oriented to person, place, and time. He has normal reflexes.  Slumped to the left in the chair and unable to correct even with max verbal/tactile cues.  Left inattention but able to turn eyes to left field.  He is impulsive and lacks insight/ awareness of deficits. Able to follow simple one and two step commands but with perseverative behaviors requiring redirection.  Left facial weakness Motor: RUE/RLE: 5/5 proximal to distal [name]: 0/5 shoulder abduction, elbow flex/ext, 3/5 finger grip [name]: 0/5 proximal to distal   Skin: Skin is warm and dry.  Foam dressing on scalp  Psychiatric: His affect is inappropriate. His speech is not slurred. He is slowed. He expresses impulsivity. He is inattentive.    Results for orders placed or performed during the hospital encounter of 11/18/15 (from the past 24 hour(s))  Troponin I (q 6hr x 3)      Status: Abnormal   Collection Time: 11/20/15 10:38 AM  Result Value Ref Range   Troponin I 0.07 (H) <0.031 ng/mL   Ct Angio Head W/cm &/or Wo Cm  11/18/2015  ADDENDUM REPORT: 11/18/2015 14:02 ADDENDUM: Study discussed by telephone with Dr. Roland Rack on 11/18/2015 at 1357 hours. At this time the patient is in route from [facility]. Electronically Signed   By: Genevie Ann M.D.   On: 11/18/2015 14:02  11/18/2015  CLINICAL DATA:  49 year old male code stroke. Left side weakness. Initial encounter. EXAM: CT ANGIOGRAPHY HEAD AND NECK TECHNIQUE: Multidetector CT imaging of the head and neck was performed using the standard protocol during bolus administration of intravenous contrast. Multiplanar CT image reconstructions and MIPs were obtained to evaluate the vascular anatomy. Carotid stenosis measurements (when applicable) are obtained utilizing NASCET criteria, using the distal internal carotid diameter as the denominator. CONTRAST:  26mL OMNIPAQUE IOHEXOL 350 MG/ML SOLN COMPARISON:  Head CT without contrast 1107 hours today. And earlier Cervical spine MRI 12/16/2008. FINDINGS: CTA NECK Skeleton: Chronic odontoid fracture or os odontoidium, unchanged from 2010 No acute osseous abnormality identified. Visualized paranasal sinuses and mastoids are clear. Other neck: Cardiomegaly. No pericardial effusion. Negative visualized lung parenchyma. No superior mediastinal lymphadenopathy. Streak artifact at the thoracic inlet related to large body habitus. Thyroid, larynx, pharynx, parapharyngeal spaces, retropharyngeal space, sublingual space, submandibular glands, and parotid glands are within normal limits. No cervical lymphadenopathy. Aortic arch: Suboptimal arterial contrast bolus timing distal to the common carotid arteries. Negative visualized thoracic aorta. There is coronary artery calcified plaque and/or stent noted. No great vessel origin stenosis. Bovine type arch configuration. Right carotid  system: Streak artifact at the thoracic inlet degrading detail of the proximal right CCA which appears normal. Suboptimal contrast bolus as the vessel approaches the right carotid bifurcation. At the bifurcation there is calcified plaque, but no hemodynamically significant stenosis. Negative cervical right ICA otherwise. Left carotid system: Bovine type arch configuration otherwise negative left CCA origin. Suboptimal contrast bolus beginning just proximal to the carotid bifurcation. Soft and calcified plaque at the carotid bifurcation but no proximal left ICA stenosis results. More distal cervical left ICA appears within normal limits. Vertebral arteries:No proximal subclavian artery stenosis. Detail of both vertebral artery origins and V1 segments is degraded by streak artifact. Both V2 segments are patent in the neck and appear codominant. CTA HEAD Posterior circulation: Suboptimal intracranial contrast, venous contrast phase is dominant. Both distal vertebral arteries are patent. beyond the left PICA origin the right is dominant. No distal vertebral artery stenosis identified. No definite basilar artery stenosis. Fetal type right PCA origin. Left PCA origin within normal limits. No proximal PCA occlusion. Anterior circulation: Suboptimal intracranial contrast, primarily in the venous phase. Moderate calcified siphon plaque. Both ICA siphons remain patent. Both carotid termini are patent. Both MCA and ACA origins are patent. Tortuous proximal ACAs. Both proximal and mid A2 segments are patent. Left MCA M1 segment and left MCA bifurcation are patent. Right  MCA M1 segment is patent, but there is asymmetric decreased enhancement in the anterior right MCA division. Venous sinuses: Patent. Anatomic variants: Fetal type right PCA origin. No changes of acute cortically based infarct are identified on this study. IMPRESSION: 1. Limited by venous dominant contrast timing in the distal neck and head. 2. Anterior division  right MCA M2 occlusion strongly suspected. The right right ICA and MCA M1 segment are patent. 3. Bilateral carotid bifurcation atherosclerosis without hemodynamically significant stenosis. Negative arch and CCA origins. 4. Vertebral arteries are poorly evaluated in the neck. No posterior circulation occlusion. Electronically Signed: By: Genevie Ann M.D. On: 11/18/2015 13:56   Ct Head Wo Contrast  11/18/2015  CLINICAL DATA:  49 year old male code stroke status post neuro intervention. Initial encounter. EXAM: CT HEAD WITHOUT CONTRAST TECHNIQUE: Contiguous axial images were obtained from the base of the skull through the vertex without intravenous contrast. COMPARISON:  CTA 1319 hours today. Noncontrast head CT 1107 hours today. FINDINGS: No acute intracranial hemorrhage identified. No midline shift, mass effect, or evidence of intracranial mass lesion. There is some residual intra-arterial contrast, most apparent in the venous sinuses. There is cytotoxic edema in the medial right superior frontal gyrus (series 2, image 23). No other changes of acute cortically based infarct are identified. Gray-white matter differentiation elsewhere is stable. No acute osseous abnormality identified. Stable paranasal sinuses and mastoids. Stable orbit and scalp soft tissues. IMPRESSION: 1. Moderate sized area of cortically based infarct in the medial right superior frontal gyrus compatible with posterior right ACA versus MCA infarct. 2. No associated hemorrhage or mass effect. 3. Otherwise stable noncontrast CT appearance of the brain. Electronically Signed   By: Genevie Ann M.D.   On: 11/18/2015 20:29     Mr Brain Wo Contrast  11/19/2015  CLINICAL DATA:  49 year old male presented with acute onset left side weakness, suspected right M2 branch occlusion on CTA. Status post IV tPA cerebral angiogram revealing right ACA A2 occlusion, no right MCA defect. Initial encounter. EXAM: MRI HEAD WITHOUT CONTRAST TECHNIQUE: Multiplanar,  multiecho pulse sequences of the brain and surrounding structures were obtained without intravenous contrast. COMPARISON:  Post angiogram CT head 11/18/2015 at 2003 hours, and earlier. Including cervical spinal MRI 12/16/2008 FINDINGS: Confluent restricted diffusion throughout the posterior right ACA territory encompassing an area of 9 cm long, and extending from the right corpus callosum cephalad to the right superior frontal gyrus. Diffusion abnormality extends to the right splenium. Associated cytotoxic edema with T2 and FLAIR hyperintensity. Gyral swelling with no associated hemorrhage. No significant intracranial mass effect. No other convincing restricted diffusion. Major intracranial vascular flow voids are preserved. No ventriculomegaly. Negative pituitary. There is a chronic lacunar infarct in the central pons (series 8, image 10). This is new since 2010. Chronic focal spinal cord myelomalacia re- identified at the odontoid level. Chronically abnormal odontoid compatible with ununited remote fracture. Chronic lacunar infarct of the left caudate or caudothalamic groove also suspected (series 11, image 17). No chronic cerebral blood products. Aside from these an the acute findings there is mild to moderate for age nonspecific white matter (mostly periventricular) T2 and FLAIR hyperintensity. Visible internal auditory structures appear normal. Normal bone marrow signal. Trace mastoid fluid. Negative paranasal sinuses. Orbit and scalp soft tissues are within normal limits. IMPRESSION: 1. Large acute right ACA territory infarct. No associated hemorrhage or mass effect. 2. No other acute intracranial abnormality. 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 3. 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 Electronically Signed   By: Genevie Ann M.D.   On: 11/19/2015 13:38     Assessment/Plan: Diagnosis: Acute R-ACA infarct Labs and images independently reviewed.  Records reviewed and summated above. Stroke: Continue secondary stroke prophylaxis and Risk Factor Modification listed below:   Antiplatelet therapy Blood Pressure Management:  Continue current medication with prn's with permisive HTN per primary team Statin Agent Substance abuse:  Cont to counsel Left sided hemiparesis: fit for orthotics to prevent contractures (resting hand splint for day, wrist cock up splint at night, PRAFO, etc)  Motor recovery: Fluoxetine  1. Does the need for close, 24 hr/day medical supervision in concert with the patient's rehab needs make it unreasonable for this patient to be served in a less intensive setting? Yes  2. Co-Morbidities requiring supervision/potential complications: HTN (monitor and provide prns in accordance with increased physical exertion and pain), depression (ensure mood does not limit functional progress; consider medications if warranted), chronic pain (biofeedback training with therapies to help reduce reliance on opiate pain medications, monitor pain control during therapies, and sedation at rest and titrate to maximum efficacy to ensure participation and gains in therapies), substance abuse (marijuana, tobacco, Etoh - cont to counsel, watch for withdrawals, CIWA) 3. Due to safety, skin/wound care, disease management, medication administration, pain management and patient education, does the patient require 24 hr/day rehab nursing? Yes 4. Does the patient require coordinated care of a physician, rehab nurse, PT (1-2 hrs/day, 5 days/week) and OT (1-2 hrs/day, 5 days/week) to address physical and functional deficits in the context of the above medical diagnosis(es)? Yes Addressing deficits in the following areas: balance, endurance, locomotion, strength, transferring, bathing, dressing, grooming, toileting and psychosocial support 5. Can the patient actively  participate in an intensive therapy program of at least 3 hrs of therapy per day at least 5 days per week? Yes 6. The potential for patient to make measurable gains while on inpatient rehab is excellent 7. Anticipated functional outcomes upon discharge from inpatient rehab are min assist and mod assist  with PT, min assist with OT, n/a with SLP. 8. Estimated rehab length of stay to reach the above functional goals is: 20-24 days. 9. Does the patient have adequate social supports and living environment to accommodate these discharge functional goals? Yes 10. Anticipated D/C setting: Home 11. Anticipated post D/C treatments: HH therapy and Home excercise program 12. Overall Rehab/Functional Prognosis: good  RECOMMENDATIONS: This patient's condition is appropriate for continued rehabilitative care in the following setting: CIR Patient has agreed to participate in recommended program. Potentially Note that insurance prior authorization may be required for reimbursement for recommended care.  Comment: Rehab Admissions Coordinator to follow up.  Delice Lesch, MD 11/20/2015

## 2015-11-20 NOTE — Progress Notes (Signed)
  Echocardiogram 2D Echocardiogram has been performed.  Diamond Nickel 11/20/2015, 3:33 PM

## 2015-11-20 NOTE — Progress Notes (Signed)
    [facility] has been requested to perform a transesophageal echocardiogram for stroke evaluation.  After careful review of history and examination, the risks and benefits of transesophageal echocardiogram have been explained including risks of esophageal damage, perforation (1:10,000 risk), bleeding, pharyngeal hematoma as well as other potential complications associated with conscious sedation including aspiration, arrhythmia, respiratory failure and death. Alternatives to treatment were discussed, questions were answered. Patient is willing to proceed.  TEE - Dr. Aundra Dubin @ 1500. NPO after midnight. Meds with sips.   Taylor Broeker, PA-C 11/20/2015 7:05 PM

## 2015-11-20 NOTE — Care Management Note (Signed)
Case Management Note  Patient Details  Name: Taylor Obrien MRN: [id number] Date of Birth: 10-04-1966  Subjective/Objective:    Pt admitted on 11/18/15 with Rt MCA branch infarct.  PTA, pt independent, lives with spouse.                  Action/Plan: PT/OT recommending CIR, and consult has been ordered.  Wife states she can provide 24hr assistance at dc.  Will follow progress.    Expected Discharge Date:                  Expected Discharge Plan:  [place]  In-House Referral:     Discharge planning Services  CM Consult  Post Acute Care Choice:    Choice offered to:     DME Arranged:    DME Agency:     HH Arranged:    [place] Agency:     Status of Service:  In process, will continue to follow  Medicare Important Message Given:    Date Medicare IM Given:    Medicare IM give by:    Date Additional Medicare IM Given:    Additional Medicare Important Message give by:     If discussed at [place] of Stay Meetings, dates discussed:    Additional Comments:  Reinaldo Raddle, RN, BSN  Trauma/Neuro ICU Case Manager [phone]

## 2015-11-20 NOTE — Progress Notes (Signed)
MD notified of patient's new onset confusion. Patient still following commands and pupils are equal and reactive. Patient alert and oriented to self and time and partially situation. No new orders received at this time. RN will continue to monitor patient closely.

## 2015-11-21 ENCOUNTER — Other Ambulatory Visit: Payer: Self-pay | Admitting: Nurse Practitioner

## 2015-11-21 ENCOUNTER — Encounter (HOSPITAL_COMMUNITY)
Admission: EM | Disposition: A | Discharge status: Rehabilitation Facility | Payer: Self-pay | Source: Home / Self Care | Attending: Neurology

## 2015-11-21 ENCOUNTER — Inpatient Hospital Stay (HOSPITAL_COMMUNITY): Payer: Medicaid Other

## 2015-11-21 DIAGNOSIS — I639 Cerebral infarction, unspecified: Secondary | ICD-10-CM

## 2015-11-21 HISTORY — PX: TEE WITHOUT CARDIOVERSION: SHX5443

## 2015-11-21 SURGERY — ECHOCARDIOGRAM, TRANSESOPHAGEAL
Anesthesia: Moderate Sedation

## 2015-11-21 MED ORDER — HYDROCHLOROTHIAZIDE 25 MG PO TABS
25.0000 mg | ORAL_TABLET | Freq: Every day | ORAL | Status: DC
  Administered 2015-11-21 – 2015-11-23 (×3): 25 mg via ORAL
  Filled 2015-11-21 (×3): qty 1

## 2015-11-21 MED ORDER — DIPHENHYDRAMINE HCL 50 MG/ML IJ SOLN
INTRAMUSCULAR | Status: AC
  Filled 2015-11-21: qty 1

## 2015-11-21 MED ORDER — MIDAZOLAM HCL 10 MG/2ML IJ SOLN
INTRAMUSCULAR | Status: DC | PRN
  Administered 2015-11-21: 1 mg via INTRAVENOUS
  Administered 2015-11-21 (×2): 2 mg via INTRAVENOUS

## 2015-11-21 MED ORDER — ASPIRIN 325 MG PO TABS
325.0000 mg | ORAL_TABLET | Freq: Every day | ORAL | Status: DC
  Administered 2015-11-21 – 2015-11-23 (×3): 325 mg via ORAL
  Filled 2015-11-21 (×2): qty 1

## 2015-11-21 MED ORDER — MIDAZOLAM HCL 5 MG/ML IJ SOLN
INTRAMUSCULAR | Status: AC
  Filled 2015-11-21: qty 2

## 2015-11-21 MED ORDER — CLONIDINE HCL 0.1 MG PO TABS
0.3000 mg | ORAL_TABLET | Freq: Every evening | ORAL | Status: DC | PRN
  Administered 2015-11-21 – 2015-11-22 (×4): 0.3 mg via ORAL
  Filled 2015-11-21 (×5): qty 3

## 2015-11-21 MED ORDER — SODIUM CHLORIDE 0.9 % IV SOLN: INTRAVENOUS | Status: DC

## 2015-11-21 MED ORDER — AMLODIPINE BESYLATE 10 MG PO TABS
10.0000 mg | ORAL_TABLET | Freq: Every day | ORAL | Status: DC
  Administered 2015-11-21 – 2015-11-23 (×3): 10 mg via ORAL
  Filled 2015-11-21 (×3): qty 1

## 2015-11-21 MED ORDER — FENTANYL CITRATE (PF) 100 MCG/2ML IJ SOLN
INTRAMUSCULAR | Status: AC
  Filled 2015-11-21: qty 2

## 2015-11-21 MED ORDER — BUTAMBEN-TETRACAINE-BENZOCAINE 2-2-14 % EX AERO
INHALATION_SPRAY | CUTANEOUS | Status: DC | PRN
  Administered 2015-11-21: 2 via TOPICAL

## 2015-11-21 MED ORDER — ASPIRIN 325 MG PO TABS
ORAL_TABLET | ORAL | Status: AC
  Filled 2015-11-21: qty 1

## 2015-11-21 MED ORDER — FENTANYL CITRATE (PF) 100 MCG/2ML IJ SOLN
INTRAMUSCULAR | Status: DC | PRN
  Administered 2015-11-21 (×3): 25 ug via INTRAVENOUS

## 2015-11-21 NOTE — Progress Notes (Signed)
  Echocardiogram Echocardiogram Transesophageal has been performed.  Darlina Sicilian M 11/21/2015, 4:05 PM

## 2015-11-21 NOTE — Progress Notes (Signed)
Physical Therapy Treatment Patient Details Name: Taylor Obrien MRN: [id number] DOB: 11-24-1966 Today's Date: 11/21/2015    History of Present Illness pt presents with Posterior R ACA Infarct s/p Cerebral Angiogram.  pt with hx of HTN, Depression, and Polysubstance.      PT Comments    Pt with improved ability to follow cueing today and able to decrease amount of pushing with R UE.  Pt also needs less cueing to attend to L side and even tracked PT to L side without cueing.  Continue to feel pt would benefit from CIR at D/C.    Follow Up Recommendations  CIR     Equipment Recommendations  None recommended by PT    Recommendations for Other Services Rehab consult     Precautions / Restrictions Precautions Precautions: Fall Restrictions Weight Bearing Restrictions: No    Mobility  Bed Mobility Overal bed mobility: Needs Assistance;+2 for physical assistance Bed Mobility: Supine to Sit     Supine to sit: Mod assist;+2 for physical assistance     General bed mobility comments: cues for sequencing and use of R UE to minimize pushing.    Transfers Overall transfer level: Needs assistance Equipment used: 2 person hand held assist Transfers: Sit to/from [facility] Sit to Stand: Mod assist;+2 physical assistance Stand pivot transfers: Max assist;+2 physical assistance       General transfer comment: With cueing, pt with decreased pushing today and better able to participate in transfer towards R side.  Continues to need L LE blocked and increased A to complete pivot.    Ambulation/Gait                 Stairs            Wheelchair Mobility    Modified Rankin (Stroke Patients Only) Modified Rankin (Stroke Patients Only) Pre-Morbid Rankin Score: No symptoms Modified Rankin: Severe disability     Balance Overall balance assessment: Needs assistance Sitting-balance support: Single extremity supported;Feet supported Sitting balance-Leahy  Scale: Poor Sitting balance - Comments: pt strong pusher towards L side.  Needs consistent cues and facilitation for weightshifting towards R and reaching R UE towards R side.  Worked on propping on R elbow while sitting.   Postural control: Left lateral lean Standing balance support: No upper extremity supported;During functional activity Standing balance-Leahy Scale: Poor                      Cognition Arousal/Alertness: Awake/alert Behavior During Therapy: WFL for tasks assessed/performed Overall Cognitive Status: Impaired/Different from baseline Area of Impairment: Attention;Following commands;Safety/judgement   Current Attention Level: Selective   Following Commands: Follows one step commands with increased time Safety/Judgement: Decreased awareness of safety;Decreased awareness of deficits     General Comments: Less cueing needed today to attend to L side.      Exercises      General Comments        Pertinent Vitals/Pain Pain Assessment: No/denies pain    Home Living                      Prior Function            PT Goals (current goals can now be found in the care plan section) Acute Rehab PT Goals Patient Stated Goal: Per wife to move well enough for her to A at home.   PT Goal Formulation: With patient/family Time For Goal Achievement: 12/04/15 Potential to Achieve Goals: Good Progress  towards PT goals: Progressing toward goals    Frequency  Min 4X/week    PT Plan Current plan remains appropriate    Co-evaluation             End of Session Equipment Utilized During Treatment: Gait belt Activity Tolerance: Patient tolerated treatment well Patient left: in chair;with call bell/phone within reach;with chair alarm set;with family/visitor present     Time: 0835-0900 PT Time Calculation (min) (ACUTE ONLY): 25 min  Charges:  $Therapeutic Activity: 23-37 mins                    G CodesCatarina Hartshorn,  [place] 11/21/2015, 9:48 AM

## 2015-11-21 NOTE — CV Procedure (Signed)
Procedure: TEE  Indication: CVA  Sedation: Versed 5 mg IV, Fentanyl 75 mcg IV  Findings: Please see echo section for full report.  Normal LV size with moderate-severe LV hypertrophy.  EF 55-60%. Normal RV size and systolic function.  Mild left atrial enlargement with no LA appendage thrombus.  Normal RA.  No significant mitral regurgitation.  Trileaflet aortic valve without stenosis or regurgitation.  Normal caliber aorta with minimal plaque.  Negative bubble study, no PFO or ASD.   No source for embolism noted.   [name] 11/21/2015 3:51 PM

## 2015-11-21 NOTE — Progress Notes (Signed)
SLP Cancellation Note  Patient Details Name: Taylor Obrien MRN: [id number] DOB: [birth year]   Cancelled treatment:       Reason Eval/Treat Not Completed: Medical issues which prohibited therapy. Per chart, pt is NPO pending TEE this afternoon. Will f/u as able - likely to be done on next date given planned time for procedure.   Taylor Obrien, M.A. CCC-SLP [phone]  Taylor Obrien 11/21/2015, 8:50 AM

## 2015-11-21 NOTE — H&P (View-Only) (Signed)
STROKE TEAM PROGRESS NOTE   HISTORY Taylor Obrien is a 49 y.o. male with a history of htn who presented with acute onset left sided weakness that started right at 9am on the day of admission. He was evaluated at [facility] and had a CTA which shows an M2 occlusion. He has a continued NIH of 9 after tpa and had a lesion possibly amenable to intervention.  Therefore Dr. Estanislado Pandy performed angiogram .  Findings below. 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  Overall he is awake and able to discuss his condition in detail.  Plan is to do TEE followed by a loop recorder today   OBJECTIVE Temp:  [97.5 F (36.4 C)-98.5 F (36.9 C)] 98.4 F (36.9 C) (12/13 1146) Pulse Rate:  [71-112] 88 (12/13 1429) Cardiac Rhythm:  [-] Normal sinus rhythm (12/13 1200) Resp:  [10-30] 14 (12/13 1429) BP: (123-186)/(83-120) 155/105 mmHg (12/13 1400) SpO2:  [92 %-100 %] 99 % (12/13 1429)  CBC:   Recent Labs Lab 11/18/15 1100 11/18/15 1111  WBC 7.4  --   NEUTROABS 5.4  --   HGB 17.4* 18.4*  HCT 48.7 54.0*  MCV 96.6  --   PLT 172  --     Basic Metabolic Panel:   Recent Labs Lab 11/18/15 1100 11/18/15 1111  NA 137 138  K 3.7 3.7  CL 97* 99*  CO2 27  --   GLUCOSE 140* 135*  BUN 10 8  CREATININE 1.16 1.10  CALCIUM 9.4  --     Lipid Panel:     Component Value Date/Time   CHOL 277* 11/19/2015 0549   TRIG 290* 11/19/2015 0549   HDL 40* 11/19/2015 0549   CHOLHDL 6.9 11/19/2015 0549   VLDL 58* 11/19/2015 0549   LDLCALC 179* 11/19/2015 0549   HgbA1c:  Lab Results  Component Value Date   HGBA1C 5.8* 11/19/2015   Urine Drug Screen:     Component Value Date/Time   LABOPIA NONE DETECTED 11/18/2015 1234   COCAINSCRNUR NONE DETECTED 11/18/2015 1234   LABBENZ NONE DETECTED 11/18/2015 1234   AMPHETMU NONE DETECTED 11/18/2015 1234   THCU POSITIVE* 11/18/2015 1234   LABBARB NONE DETECTED 11/18/2015 1234      IMAGING  Ct Angio Head and Neck W/cm &/or Wo Cm 11/18/2015   1. Limited by venous dominant contrast timing in the distal neck and head.  2. Anterior division right MCA M2 occlusion strongly suspected. The right right ICA and MCA M1 segment are patent.  3. Bilateral carotid bifurcation atherosclerosis without hemodynamically significant stenosis. Negative arch and CCA origins.  4. Vertebral arteries are poorly evaluated in the neck. No posterior circulation occlusion.    Ct Head Wo Contrast 11/18/2015   1. Moderate sized area of cortically based infarct in the medial right superior frontal gyrus compatible with posterior right ACA versus MCA infarct.  2. No associated hemorrhage or mass effect.  3. Otherwise stable noncontrast CT appearance of the brain.    Ct Head Wo Contrast 11/18/2015   Small focus of decreased attenuation in the midline of the upper pons, not present previously. A small brainstem infarct must be of concern given this appearance. Elsewhere gray-white compartments appear normal except for rather minimal small vessel disease adjacent to the frontal horns of the lateral ventricles. No edema  is appreciable. No hemorrhage.   S/P 4 vessel cerebral arteriogram. RT CFA approach. 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  Psych: Affect appropriate to situation Eyes: No scleral injection; PERRL Head: Normocephalic.  Cardiovascular: Normal rate and regular rhythm.  Respiratory: Effort normal and breath sounds normal to anterior ascultation GI: Soft. No distension. There is no tenderness.  Skin: WDI Groin site bandaged and bandage is bloody.  10lb  weight on site  Neuro: Mental Status: Patient is awake, alert, oriented to person, place, month, year, and situation. No signs of aphasia or neglect  Cranial Nerves: II: Pupils are equal, round, and reactive to light.  III,IV, VI: EOMI without ptosis or diploplia.  V: Facial sensation is symmetric to temperature VII: Facial movement is mildly decerased on the left with mild dysarthria.  VIII: hearing is intact to voice XI: Shoulder shrug decreased on the left. XII: tongue is midline without atrophy or fasciculations.   Motor: Tone is normal. Bulk is normal. 5/5 strength on the right, no movement left leg to command, but occasional reflexive movement; He is able to grip but does not have strength proximally.  No effort vs gravity proximally   Sensory: Sensation is decreased on the left   Cerebellar:  Not tested due to lethargy  Gait: Not tested due to line in groin and hemiplegia  ASSESSMENT/PLAN [name] is a 49 y.o. male with history of depression, ongoing tobacco use, hyperlipidemia, chronic pain, and substance abuse presenting with acute onset of left-sided weakness. He received TPA 81 mg on Saturday, 11/18/2015 at 1145.  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  Resultant  Left hemiplegia MRI  1. Large acute right ACA territory infarct. No associated hemorrhage or mass effect. 2. No other acute intracranial abnormality. 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 3. 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    MRA  not ordered see catheter angio  Carotid Doppler  refer to CTA of the neck  2D Echo  pending  LDL 179  HgbA1c 5.8  VTE prophylaxis - SCDs Diet NPO time specified  No antithrombotic prior to admission, now on No antithrombotic - secondary to TPA.  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   Substance abuse history   Other Active Problems  Status post cerebral angiogram without intervention  Status post [facility] day # 3 I had a long discussion with the patient and his wife at the bedside about his condition, plan of care and answered questions. Recommend transfer to the neurology floor bed if off IV blood pressure medications. Mobilize out of bed. TEE and loop recorder today.  Antony Contras, MD   To contact Stroke Continuity provider, please refer to [email or web address] After hours, contact General Neurology

## 2015-11-21 NOTE — Consult Note (Signed)
ELECTROPHYSIOLOGY CONSULT NOTE  Patient ID: Taylor Obrien MRN: [id number], DOB/AGE: February 19, 1966   Admit date: 11/18/2015 Date of Consult: 11/21/2015  Primary Physician: PROVIDER NOT IN SYSTEM Primary Cardiologist: Domenic Polite Reason for Consultation: Cryptogenic stroke; recommendations regarding Implantable Loop Recorder  History of Present Illness Taylor Obrien was admitted on 11/18/2015 with acute onset left sided weakness.  His symptoms began at 9AM the morning of admission.  He was seen at [facility] where imaging demonstrated M2 occlusion. He was transferred to [facility] for further evaluation.  He was given tPA.   He has undergone workup for stroke including echocardiogram and imaging of carotids.  The patient has been monitored on telemetry which has demonstrated sinus rhythm with no arrhythmias.  Inpatient stroke work-up is to be completed with a TEE.   Echocardiogram this admission demonstrated EF 0000000, grade 2 diastolic dysfunction, LA 51.  Lab work is reviewed.  Prior to admission, the patient denies chest pain, shortness of breath, dizziness, palpitations, or syncope.     EP has been asked to evaluate for placement of an implantable loop recorder to monitor for atrial fibrillation.   Past Medical History  Diagnosis Date  . Essential hypertension, benign   . Hyperlipidemia   . Depression   . Chronic pain   . GERD (gastroesophageal reflux disease)      Surgical History:  Past Surgical History  Procedure Laterality Date  . Right rotator cuff repair    . Closed reduction mandibular fracture w/ arch bars      + multiple extractions  . Removal foreign body right shoulder    . Condyloma resection    . Radiology with anesthesia N/A 11/18/2015    Procedure: RADIOLOGY WITH ANESTHESIA;  Surgeon: Luanne Bras, MD;  Location: [facility];  Service: Radiology;  Laterality: N/A;     Prescriptions prior to admission  Medication Sig Dispense Refill Last Dose  . acetaminophen  (TYLENOL) 500 MG tablet Take 1,000 mg by mouth every 6 (six) hours as needed for pain.   Past Week at Unknown time  . amLODipine (NORVASC) 10 MG tablet Take 1 tablet (10 mg total) by mouth daily. 30 tablet 0 Past Week at Unknown time  . cloNIDine (CATAPRES) 0.3 MG tablet Take 0.3 mg by mouth at bedtime.   Past Week at Unknown time  . hydrochlorothiazide (HYDRODIURIL) 25 MG tablet Take 1 tablet (25 mg total) by mouth daily. 30 tablet 0 Past Week at Unknown time  . HYDROcodone-acetaminophen (NORCO) 7.5-325 MG tablet Take 1 tablet by mouth every 4 (four) hours as needed for moderate pain.   Past Week at Unknown time    Inpatient Medications:  . antiseptic oral rinse  7 mL Mouth Rinse BID  . aspirin  300 mg Rectal Daily  . folic acid  1 mg Oral Daily  . LORazepam  0-4 mg Intravenous Q12H  . multivitamin with minerals  1 tablet Oral Daily  . nicotine  14 mg Transdermal Daily  . pantoprazole (PROTONIX) IV  40 mg Intravenous QHS  . thiamine  100 mg Oral Daily   Or  . thiamine  100 mg Intravenous Daily    Allergies:  Allergies  Allergen Reactions  . Amoxicillin   . Oxcarbazepine Other (See Comments)    Patient goes out of right state of mind.   . Penicillins     Social History   Social History  . Marital Status: Married    Spouse Name: N/A  . Number of Children:  N/A  . Years of Education: N/A   Occupational History  . Not on file.   Social History Main Topics  . Smoking status: Current Every Day Smoker -- 0.50 packs/day for 30 years    Types: Cigarettes  . Smokeless tobacco: Never Used  . Alcohol Use: 0.0 oz/week     Comment: daily-per patiet  . Drug Use: Yes    Special: Marijuana  . Sexual Activity: Yes    Birth Control/ Protection: None   Other Topics Concern  . Not on file   Social History Narrative     Family History  Problem Relation Age of Onset  . Diabetes Mother   . Hypertension Mother   . Diabetes Father   . Hypertension Father   . Diabetes Brother   .  Hypertension Brother       Review of Systems: All other systems reviewed and are otherwise negative except as noted above.  Physical Exam: Filed Vitals:   11/21/15 0545 11/21/15 0600 11/21/15 0615 11/21/15 0630  BP: 173/106 167/105 173/119 157/105  Pulse: 89 83 84 82  Temp:      TempSrc:      Resp: 18 20 13 16   Height:      Weight:      SpO2: 98% 100% 100% 100%    GEN- The patient is obese appearing, alert and oriented x 3 today.   Head- normocephalic, atraumatic Eyes-  Sclera clear, conjunctiva pink Ears- hearing intact Oropharynx- clear Neck- supple Lungs- Clear to ausculation bilaterally, normal work of breathing Heart- Regular rate and rhythm, no murmurs, rubs or gallops  GI- soft, NT, ND, + BS Extremities- no clubbing, cyanosis, or edema MS- no significant deformity or atrophy Skin- no rash or lesion Psych- euthymic mood, full affect   Labs:   Lab Results  Component Value Date   WBC 7.4 11/18/2015   HGB 18.4* 11/18/2015   HCT 54.0* 11/18/2015   MCV 96.6 11/18/2015   PLT 172 11/18/2015    Recent Labs Lab 11/18/15 1100 11/18/15 1111  NA 137 138  K 3.7 3.7  CL 97* 99*  CO2 27  --   BUN 10 8  CREATININE 1.16 1.10  CALCIUM 9.4  --   PROT 8.0  --   BILITOT 1.2  --   ALKPHOS 57  --   ALT 64*  --   AST 90*  --   GLUCOSE 140* 135*     Radiology/Studies: Ct Angio Head W/cm &/or Wo Cm 11/18/2015  ADDENDUM REPORT: 11/18/2015 14:02 ADDENDUM: Study discussed by telephone with Dr. Roland Rack on 11/18/2015 at 1357 hours. At this time the patient is in route from [facility]. Electronically Signed   By: Genevie Ann M.D.   On: 11/18/2015 14:02 11/18/2015  CLINICAL DATA:  49 year old male code stroke. Left side weakness. Initial encounter. EXAM: CT ANGIOGRAPHY HEAD AND NECK TECHNIQUE: Multidetector CT imaging of the head and neck was performed using the standard protocol during bolus administration of intravenous contrast. Multiplanar CT image  reconstructions and MIPs were obtained to evaluate the vascular anatomy. Carotid stenosis measurements (when applicable) are obtained utilizing NASCET criteria, using the distal internal carotid diameter as the denominator. CONTRAST:  4mL OMNIPAQUE IOHEXOL 350 MG/ML SOLN COMPARISON:  Head CT without contrast 1107 hours today. And earlier Cervical spine MRI 12/16/2008. FINDINGS: CTA NECK Skeleton: Chronic odontoid fracture or os odontoidium, unchanged from 2010 No acute osseous abnormality identified. Visualized paranasal sinuses and mastoids are clear. Other neck: Cardiomegaly. No  pericardial effusion. Negative visualized lung parenchyma. No superior mediastinal lymphadenopathy. Streak artifact at the thoracic inlet related to large body habitus. Thyroid, larynx, pharynx, parapharyngeal spaces, retropharyngeal space, sublingual space, submandibular glands, and parotid glands are within normal limits. No cervical lymphadenopathy. Aortic arch: Suboptimal arterial contrast bolus timing distal to the common carotid arteries. Negative visualized thoracic aorta. There is coronary artery calcified plaque and/or stent noted. No great vessel origin stenosis. Bovine type arch configuration. Right carotid system: Streak artifact at the thoracic inlet degrading detail of the proximal right CCA which appears normal. Suboptimal contrast bolus as the vessel approaches the right carotid bifurcation. At the bifurcation there is calcified plaque, but no hemodynamically significant stenosis. Negative cervical right ICA otherwise. Left carotid system: Bovine type arch configuration otherwise negative left CCA origin. Suboptimal contrast bolus beginning just proximal to the carotid bifurcation. Soft and calcified plaque at the carotid bifurcation but no proximal left ICA stenosis results. More distal cervical left ICA appears within normal limits. Vertebral arteries:No proximal subclavian artery stenosis. Detail of both vertebral  artery origins and V1 segments is degraded by streak artifact. Both V2 segments are patent in the neck and appear codominant. CTA HEAD Posterior circulation: Suboptimal intracranial contrast, venous contrast phase is dominant. Both distal vertebral arteries are patent. beyond the left PICA origin the right is dominant. No distal vertebral artery stenosis identified. No definite basilar artery stenosis. Fetal type right PCA origin. Left PCA origin within normal limits. No proximal PCA occlusion. Anterior circulation: Suboptimal intracranial contrast, primarily in the venous phase. Moderate calcified siphon plaque. Both ICA siphons remain patent. Both carotid termini are patent. Both MCA and ACA origins are patent. Tortuous proximal ACAs. Both proximal and mid A2 segments are patent. Left MCA M1 segment and left MCA bifurcation are patent. Right MCA M1 segment is patent, but there is asymmetric decreased enhancement in the anterior right MCA division. Venous sinuses: Patent. Anatomic variants: Fetal type right PCA origin. No changes of acute cortically based infarct are identified on this study. IMPRESSION: 1. Limited by venous dominant contrast timing in the distal neck and head. 2. Anterior division right MCA M2 occlusion strongly suspected. The right right ICA and MCA M1 segment are patent. 3. Bilateral carotid bifurcation atherosclerosis without hemodynamically significant stenosis. Negative arch and CCA origins. 4. Vertebral arteries are poorly evaluated in the neck. No posterior circulation occlusion. Electronically Signed: By: Genevie Ann M.D. On: 11/18/2015 13:56   Mr Brain Wo Contrast 11/19/2015  CLINICAL DATA:  49 year old male presented with acute onset left side weakness, suspected right M2 branch occlusion on CTA. Status post IV tPA cerebral angiogram revealing right ACA A2 occlusion, no right MCA defect. Initial encounter. EXAM: MRI HEAD WITHOUT CONTRAST TECHNIQUE: Multiplanar, multiecho pulse sequences of  the brain and surrounding structures were obtained without intravenous contrast. COMPARISON:  Post angiogram CT head 11/18/2015 at 2003 hours, and earlier. Including cervical spinal MRI 12/16/2008 FINDINGS: Confluent restricted diffusion throughout the posterior right ACA territory encompassing an area of 9 cm long, and extending from the right corpus callosum cephalad to the right superior frontal gyrus. Diffusion abnormality extends to the right splenium. Associated cytotoxic edema with T2 and FLAIR hyperintensity. Gyral swelling with no associated hemorrhage. No significant intracranial mass effect. No other convincing restricted diffusion. Major intracranial vascular flow voids are preserved. No ventriculomegaly. Negative pituitary. There is a chronic lacunar infarct in the central pons (series 8, image 10). This is new since 2010. Chronic focal spinal cord myelomalacia re- identified at  the odontoid level. Chronically abnormal odontoid compatible with ununited remote fracture. Chronic lacunar infarct of the left caudate or caudothalamic groove also suspected (series 11, image 17). No chronic cerebral blood products. Aside from these an the acute findings there is mild to moderate for age nonspecific white matter (mostly periventricular) T2 and FLAIR hyperintensity. Visible internal auditory structures appear normal. Normal bone marrow signal. Trace mastoid fluid. Negative paranasal sinuses. Orbit and scalp soft tissues are within normal limits. IMPRESSION: 1. Large acute right ACA territory infarct. No associated hemorrhage or mass effect. 2. No other acute intracranial abnormality. 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 3. 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 Electronically Signed   By: Genevie Ann M.D.   On: 11/19/2015 13:38    12-lead ECG sinus tach,  rate 102, LVH, normal intervals All prior EKG's in EPIC reviewed with no documented atrial fibrillation  Telemetry sinus rhythm  Assessment and Plan:  1. Cryptogenic stroke The patient presents with cryptogenic stroke.  The patient has a TEE planned for later today.  I spoke at length with the patient about monitoring for afib with either a 30 day event monitor or an implantable loop recorder.  Risks, benefits, and alteratives to implantable loop recorder were discussed with the patient today.   At this time, the patient is very clear in their decision to proceed with 30 day monitor. Will order and follow up in the office in 6 weeks to review results. If no AF detected, would recommend ILR at that time.    Please call with questions.    I have seen, examined the patient, and reviewed the above assessment and plan.  On exam, morbidly obese, RRR. Changes to above are made where necessary.  Would consider outpatient sleep study.  Offered 30 day event monitor vs implantable loop recorder today.  He is very clear that he would prefer 30 day monitor.  He has severe LA enlargement and is likely to have atrial fibrillation discovered on 30 day monitor.  Outpatient follow-up with Dr Domenic Polite in [place].  Could consider implantable loop recorder if no afib on 30 monitor upon follow-up  Electrophysiology team to see as needed while here. Please call with questions.   Co Sign: Thompson Grayer, MD 11/21/2015 7:53 AM

## 2015-11-21 NOTE — Interval H&P Note (Signed)
History and Physical Interval Note:  11/21/2015 3:37 PM  Taylor Obrien  has presented today for surgery, with the diagnosis of stroke  The various methods of treatment have been discussed with the patient and family. After consideration of risks, benefits and other options for treatment, the patient has consented to  Procedure(s): TRANSESOPHAGEAL ECHOCARDIOGRAM (TEE) (N/A) as a surgical intervention .  The patient's history has been reviewed, patient examined, no change in status, stable for surgery.  I have reviewed the patient's chart and labs.  Questions were answered to the patient's satisfaction.     [name] [employer]

## 2015-11-21 NOTE — Progress Notes (Signed)
STROKE TEAM PROGRESS NOTE   HISTORY Taylor Obrien is a 49 y.o. male with a history of htn who presented with acute onset left sided weakness that started right at 9am on the day of admission. He was evaluated at [facility] and had a CTA which shows an M2 occlusion. He has a continued NIH of 9 after tpa and had a lesion possibly amenable to intervention.  Therefore Dr. Estanislado Pandy performed angiogram .  Findings below. 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  Overall he is awake and able to discuss his condition in detail.  Plan is to do TEE followed by a loop recorder today   OBJECTIVE Temp:  [97.5 F (36.4 C)-98.5 F (36.9 C)] 98.4 F (36.9 C) (12/13 1146) Pulse Rate:  [71-112] 88 (12/13 1429) Cardiac Rhythm:  [-] Normal sinus rhythm (12/13 1200) Resp:  [10-30] 14 (12/13 1429) BP: (123-186)/(83-120) 155/105 mmHg (12/13 1400) SpO2:  [92 %-100 %] 99 % (12/13 1429)  CBC:   Recent Labs Lab 11/18/15 1100 11/18/15 1111  WBC 7.4  --   NEUTROABS 5.4  --   HGB 17.4* 18.4*  HCT 48.7 54.0*  MCV 96.6  --   PLT 172  --     Basic Metabolic Panel:   Recent Labs Lab 11/18/15 1100 11/18/15 1111  NA 137 138  K 3.7 3.7  CL 97* 99*  CO2 27  --   GLUCOSE 140* 135*  BUN 10 8  CREATININE 1.16 1.10  CALCIUM 9.4  --     Lipid Panel:     Component Value Date/Time   CHOL 277* 11/19/2015 0549   TRIG 290* 11/19/2015 0549   HDL 40* 11/19/2015 0549   CHOLHDL 6.9 11/19/2015 0549   VLDL 58* 11/19/2015 0549   LDLCALC 179* 11/19/2015 0549   HgbA1c:  Lab Results  Component Value Date   HGBA1C 5.8* 11/19/2015   Urine Drug Screen:     Component Value Date/Time   LABOPIA NONE DETECTED 11/18/2015 1234   COCAINSCRNUR NONE DETECTED 11/18/2015 1234   LABBENZ NONE DETECTED 11/18/2015 1234   AMPHETMU NONE DETECTED 11/18/2015 1234   THCU POSITIVE* 11/18/2015 1234   LABBARB NONE DETECTED 11/18/2015 1234      IMAGING  Ct Angio Head and Neck W/cm &/or Wo Cm 11/18/2015   1. Limited by venous dominant contrast timing in the distal neck and head.  2. Anterior division right MCA M2 occlusion strongly suspected. The right right ICA and MCA M1 segment are patent.  3. Bilateral carotid bifurcation atherosclerosis without hemodynamically significant stenosis. Negative arch and CCA origins.  4. Vertebral arteries are poorly evaluated in the neck. No posterior circulation occlusion.    Ct Head Wo Contrast 11/18/2015   1. Moderate sized area of cortically based infarct in the medial right superior frontal gyrus compatible with posterior right ACA versus MCA infarct.  2. No associated hemorrhage or mass effect.  3. Otherwise stable noncontrast CT appearance of the brain.    Ct Head Wo Contrast 11/18/2015   Small focus of decreased attenuation in the midline of the upper pons, not present previously. A small brainstem infarct must be of concern given this appearance. Elsewhere gray-white compartments appear normal except for rather minimal small vessel disease adjacent to the frontal horns of the lateral ventricles. No edema  is appreciable. No hemorrhage.   S/P 4 vessel cerebral arteriogram. RT CFA approach. 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  Psych: Affect appropriate to situation Eyes: No scleral injection; PERRL Head: Normocephalic.  Cardiovascular: Normal rate and regular rhythm.  Respiratory: Effort normal and breath sounds normal to anterior ascultation GI: Soft. No distension. There is no tenderness.  Skin: WDI Groin site bandaged and bandage is bloody.  10lb  weight on site  Neuro: Mental Status: Patient is awake, alert, oriented to person, place, month, year, and situation. No signs of aphasia or neglect  Cranial Nerves: II: Pupils are equal, round, and reactive to light.  III,IV, VI: EOMI without ptosis or diploplia.  V: Facial sensation is symmetric to temperature VII: Facial movement is mildly decerased on the left with mild dysarthria.  VIII: hearing is intact to voice XI: Shoulder shrug decreased on the left. XII: tongue is midline without atrophy or fasciculations.   Motor: Tone is normal. Bulk is normal. 5/5 strength on the right, no movement left leg to command, but occasional reflexive movement; He is able to grip but does not have strength proximally.  No effort vs gravity proximally   Sensory: Sensation is decreased on the left   Cerebellar:  Not tested due to lethargy  Gait: Not tested due to line in groin and hemiplegia  ASSESSMENT/PLAN [name] is a 49 y.o. male with history of depression, ongoing tobacco use, hyperlipidemia, chronic pain, and substance abuse presenting with acute onset of left-sided weakness. He received TPA 81 mg on Saturday, 11/18/2015 at 1145.  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  Resultant  Left hemiplegia MRI  1. Large acute right ACA territory infarct. No associated hemorrhage or mass effect. 2. No other acute intracranial abnormality. 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 3. 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    MRA  not ordered see catheter angio  Carotid Doppler  refer to CTA of the neck  2D Echo  pending  LDL 179  HgbA1c 5.8  VTE prophylaxis - SCDs Diet NPO time specified  No antithrombotic prior to admission, now on No antithrombotic - secondary to TPA.  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   Substance abuse history   Other Active Problems  Status post cerebral angiogram without intervention  Status post [facility] day # 3 I had a long discussion with the patient and his wife at the bedside about his condition, plan of care and answered questions. Recommend transfer to the neurology floor bed if off IV blood pressure medications. Mobilize out of bed. TEE and loop recorder today.  Antony Contras, MD   To contact Stroke Continuity provider, please refer to [email or web address] After hours, contact General Neurology

## 2015-11-21 NOTE — Progress Notes (Signed)
Inpatient Rehabilitation  Pt. Currently down for TEE and remains on cardene drip.  I will follow up with pt. And family tomorrow.  Please call if questions.  [facility] Admissions Coordinator Cell [phone] Office [phone]

## 2015-11-22 ENCOUNTER — Encounter (HOSPITAL_COMMUNITY): Payer: Self-pay | Admitting: Cardiology

## 2015-11-22 NOTE — Progress Notes (Signed)
Speech Language Pathology Treatment: Dysphagia  Patient Details Name: Taylor Obrien MRN: [id number] DOB: 06-Aug-1966 Today's Date: 11/22/2015 Time: [id number] SLP Time Calculation (min) (ACUTE ONLY): 10 min  Assessment / Plan / Recommendation Clinical Impression  Treatment focused on diet tolerance and use of compensatory strategies. Skilled observation of consumption of am meal noted. Overall, oropharyngeal swallowing abilities appear [facility] without overt indication of aspiration. Patient continues however to require max verbal and tactile cueing for use of small bites/sips and slow rate as a general precaution as patient severely impulsive. Although goals is for modified independent at this time, swallowing function appears to be significantly improved. Would recommend discontinuation of dysphagia goals with focus on impulsivity during cognitive treatment. Meal time or other functional ADL would continue to be a good time to address this goal.    HPI HPI: pt presents with Posterior R ACA Infarct s/p Cerebral Angiogram.  pt with hx of HTN, Depression, and Polysubstance.        SLP Plan  Discharge SLP treatment due to (comment) (see clinical impression statement)     Recommendations  Diet recommendations: Regular;Thin liquid Liquids provided via: Cup;Straw Medication Administration: Whole meds with liquid Supervision: Patient able to self feed;Intermittent supervision to cue for compensatory strategies Compensations: Slow rate;Small sips/bites Postural Changes and/or Swallow Maneuvers: Seated upright 90 degrees              Oral Care Recommendations: Oral care BID Follow up Recommendations: Inpatient Rehab Plan: Discharge SLP treatment due to (comment) (see clinical impression statement)  Gabriel Rainwater [place], CCC-SLP [phone]  Taylor Obrien Meryl 11/22/2015, 9:42 AM

## 2015-11-22 NOTE — Progress Notes (Signed)
STROKE TEAM PROGRESS NOTE   HISTORY Taylor Obrien is a 49 y.o. male with a history of htn who presented with acute onset left sided weakness that started right at 9am on the day of admission. He was evaluated at [facility] and had a CTA which shows an M2 occlusion. He has a continued NIH of 9 after tpa and had a lesion possibly amenable to intervention.  Therefore Dr. Estanislado Pandy performed angiogram .  Findings below. 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  Overall he is awake and able to discuss his condition in detail.  TEE fwas unremarkable and Dr Rayann Heman thinks 30 day monitor is preferable over loop recorder implant   OBJECTIVE Temp:  [98.2 F (36.8 C)-98.6 F (37 C)] 98.3 F (36.8 C) (12/14 1134) Pulse Rate:  [68-108] 102 (12/14 1200) Cardiac Rhythm:  [-] Sinus tachycardia (12/14 1200) Resp:  [10-21] 17 (12/14 1200) BP: (113-195)/(79-121) 157/99 mmHg (12/14 0900) SpO2:  [93 %-100 %] 100 % (12/14 1200)  CBC:   Recent Labs Lab 11/18/15 1100 11/18/15 1111  WBC 7.4  --   NEUTROABS 5.4  --   HGB 17.4* 18.4*  HCT 48.7 54.0*  MCV 96.6  --   PLT 172  --     Basic Metabolic Panel:   Recent Labs Lab 11/18/15 1100 11/18/15 1111  NA 137 138  K 3.7 3.7  CL 97* 99*  CO2 27  --   GLUCOSE 140* 135*  BUN 10 8  CREATININE 1.16 1.10  CALCIUM 9.4  --     Lipid Panel:     Component Value Date/Time   CHOL 277* 11/19/2015 0549   TRIG 290* 11/19/2015 0549   HDL 40* 11/19/2015 0549   CHOLHDL 6.9 11/19/2015 0549   VLDL 58* 11/19/2015 0549   LDLCALC 179* 11/19/2015 0549   HgbA1c:  Lab Results  Component Value Date   HGBA1C 5.8* 11/19/2015   Urine Drug Screen:     Component Value Date/Time   LABOPIA NONE DETECTED 11/18/2015 1234   COCAINSCRNUR NONE DETECTED 11/18/2015 1234   LABBENZ NONE DETECTED 11/18/2015 1234   AMPHETMU NONE DETECTED 11/18/2015 1234   THCU POSITIVE* 11/18/2015 1234   LABBARB NONE DETECTED 11/18/2015 1234      IMAGING  Ct Angio Head and Neck W/cm &/or Wo Cm 11/18/2015   1. Limited by venous dominant contrast timing in the distal neck and head.  2. Anterior division right MCA M2 occlusion strongly suspected. The right right ICA and MCA M1 segment are patent.  3. Bilateral carotid bifurcation atherosclerosis without hemodynamically significant stenosis. Negative arch and CCA origins.  4. Vertebral arteries are poorly evaluated in the neck. No posterior circulation occlusion.    Ct Head Wo Contrast 11/18/2015   1. Moderate sized area of cortically based infarct in the medial right superior frontal gyrus compatible with posterior right ACA versus MCA infarct.  2. No associated hemorrhage or mass effect.  3. Otherwise stable noncontrast CT appearance of the brain.    Ct Head Wo Contrast 11/18/2015   Small focus of decreased attenuation in the midline of the upper pons, not present previously. A small brainstem infarct must be of concern given this appearance. Elsewhere gray-white compartments appear normal except for rather minimal small vessel disease adjacent to the frontal horns of the  lateral ventricles. No edema is appreciable. No hemorrhage.   S/P 4 vessel cerebral arteriogram. RT CFA approach. 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  Psych: Affect appropriate to situation Eyes: No scleral injection; PERRL Head: Normocephalic.  Cardiovascular: Normal rate and regular rhythm.  Respiratory: Effort normal and breath sounds normal to anterior ascultation GI: Soft. No distension. There is no tenderness.  Skin: WDI Groin site  bandaged and bandage is bloody.  10lb weight on site  Neuro: Mental Status: Patient is awake, alert, oriented to person, place, month, year, and situation. No signs of aphasia or neglect  Cranial Nerves: II: Pupils are equal, round, and reactive to light.  III,IV, VI: EOMI without ptosis or diploplia.  V: Facial sensation is symmetric to temperature VII: Facial movement is mildly decerased on the left with mild dysarthria.  VIII: hearing is intact to voice XI: Shoulder shrug decreased on the left. XII: tongue is midline without atrophy or fasciculations.   Motor: Tone is normal. Bulk is normal. 5/5 strength on the right, no movement left leg to command, but occasional reflexive movement; He is able to grip but does not have strength proximally.  No effort vs gravity proximally   Sensory: Sensation is decreased on the left   Cerebellar:  Not tested due to lethargy  Gait: Not tested due to line in groin and hemiplegia  ASSESSMENT/PLAN Taylor Obrien is a 49 y.o. male with history of depression, ongoing tobacco use, hyperlipidemia, chronic pain, and substance abuse presenting with acute onset of left-sided weakness. He received TPA 81 mg on Saturday, 11/18/2015 at 1145.  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  Resultant  Left hemiplegia MRI  1. Large acute right ACA territory infarct. No associated hemorrhage or mass effect. 2. No other acute intracranial abnormality. 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 3. 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    MRA  not ordered see catheter angio  Carotid Doppler  refer to CTA of the neck  2D Echo  Left ventricle: The cavity size was normal. There was moderate concentric hypertrophy. Systolic function was normal. The estimated  ejection fraction was in the range of 55% to 60%. There is akinesis of the basalinferior myocardium.LDL 179  HgbA1c 5.8  VTE prophylaxis - SCDs Diet regular Room service appropriate?: Yes; Fluid consistency:: Thin  No antithrombotic prior to admission, now on No antithrombotic - secondary to TPA.  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   Substance abuse history   Other Active Problems  Status post cerebral angiogram without intervention  Status post [facility] day # 4 I had a long discussion with  the patient and his wife at the bedside about his condition, plan of care and answered questions. Recommend transfer to the neurology floor bed i Mobilize out of bed. Rehab disposition pending   Antony Contras, MD   To contact Stroke Continuity provider, please refer to [email or web address] After hours, contact General Neurology

## 2015-11-22 NOTE — Progress Notes (Signed)
Inpatient Rehabilitation  I met the patient at the bedside then his wife in the waiting area. I explained the details and purpose of IP Rehab.  Pt. And wife would like for pt. To receive his rehab care in CIR.  I will follow along for medical readiness and will tentatively plan to admit when he is medically ready, pending bed availability.  Please call if questions.  [place] Admissions Coordinator Cell [phone] Office [phone]

## 2015-11-23 ENCOUNTER — Inpatient Hospital Stay (HOSPITAL_COMMUNITY)
Admission: AD | Admit: 2015-11-23 | Discharge: 2015-12-20 | DRG: 064 | Disposition: A | Discharge status: Home Health | Payer: Medicaid Other | Source: Intra-hospital | Attending: Physical Medicine & Rehabilitation | Admitting: Physical Medicine & Rehabilitation

## 2015-11-23 DIAGNOSIS — I63031 Cerebral infarction due to thrombosis of right carotid artery: Secondary | ICD-10-CM

## 2015-11-23 DIAGNOSIS — I69398 Other sequelae of cerebral infarction: Secondary | ICD-10-CM

## 2015-11-23 DIAGNOSIS — I69954 Hemiplegia and hemiparesis following unspecified cerebrovascular disease affecting left non-dominant side: Secondary | ICD-10-CM

## 2015-11-23 DIAGNOSIS — R269 Unspecified abnormalities of gait and mobility: Secondary | ICD-10-CM

## 2015-11-23 DIAGNOSIS — E785 Hyperlipidemia, unspecified: Secondary | ICD-10-CM | POA: Diagnosis present

## 2015-11-23 DIAGNOSIS — Z9119 Patient's noncompliance with other medical treatment and regimen: Secondary | ICD-10-CM | POA: Diagnosis not present

## 2015-11-23 DIAGNOSIS — F418 Other specified anxiety disorders: Secondary | ICD-10-CM | POA: Diagnosis present

## 2015-11-23 DIAGNOSIS — N170 Acute kidney failure with tubular necrosis: Secondary | ICD-10-CM | POA: Diagnosis not present

## 2015-11-23 DIAGNOSIS — G4733 Obstructive sleep apnea (adult) (pediatric): Secondary | ICD-10-CM | POA: Insufficient documentation

## 2015-11-23 DIAGNOSIS — I129 Hypertensive chronic kidney disease with stage 1 through stage 4 chronic kidney disease, or unspecified chronic kidney disease: Secondary | ICD-10-CM | POA: Diagnosis present

## 2015-11-23 DIAGNOSIS — F17210 Nicotine dependence, cigarettes, uncomplicated: Secondary | ICD-10-CM | POA: Diagnosis present

## 2015-11-23 DIAGNOSIS — N179 Acute kidney failure, unspecified: Secondary | ICD-10-CM

## 2015-11-23 DIAGNOSIS — R4587 Impulsiveness: Secondary | ICD-10-CM | POA: Diagnosis present

## 2015-11-23 DIAGNOSIS — E876 Hypokalemia: Secondary | ICD-10-CM | POA: Diagnosis present

## 2015-11-23 DIAGNOSIS — I69390 Apraxia following cerebral infarction: Secondary | ICD-10-CM

## 2015-11-23 DIAGNOSIS — R030 Elevated blood-pressure reading, without diagnosis of hypertension: Secondary | ICD-10-CM | POA: Diagnosis not present

## 2015-11-23 DIAGNOSIS — M21379 Foot drop, unspecified foot: Secondary | ICD-10-CM | POA: Diagnosis present

## 2015-11-23 DIAGNOSIS — R29810 Facial weakness: Secondary | ICD-10-CM | POA: Diagnosis present

## 2015-11-23 DIAGNOSIS — R4189 Other symptoms and signs involving cognitive functions and awareness: Secondary | ICD-10-CM | POA: Diagnosis present

## 2015-11-23 DIAGNOSIS — I10 Essential (primary) hypertension: Secondary | ICD-10-CM

## 2015-11-23 DIAGNOSIS — R609 Edema, unspecified: Secondary | ICD-10-CM | POA: Diagnosis not present

## 2015-11-23 DIAGNOSIS — I69354 Hemiplegia and hemiparesis following cerebral infarction affecting left non-dominant side: Secondary | ICD-10-CM | POA: Diagnosis not present

## 2015-11-23 DIAGNOSIS — G4700 Insomnia, unspecified: Secondary | ICD-10-CM | POA: Diagnosis present

## 2015-11-23 DIAGNOSIS — I63521 Cerebral infarction due to unspecified occlusion or stenosis of right anterior cerebral artery: Secondary | ICD-10-CM

## 2015-11-23 DIAGNOSIS — R300 Dysuria: Secondary | ICD-10-CM | POA: Insufficient documentation

## 2015-11-23 DIAGNOSIS — B369 Superficial mycosis, unspecified: Secondary | ICD-10-CM | POA: Diagnosis present

## 2015-11-23 DIAGNOSIS — R040 Epistaxis: Secondary | ICD-10-CM | POA: Diagnosis not present

## 2015-11-23 DIAGNOSIS — N411 Chronic prostatitis: Secondary | ICD-10-CM | POA: Diagnosis present

## 2015-11-23 DIAGNOSIS — N183 Chronic kidney disease, stage 3 (moderate): Secondary | ICD-10-CM | POA: Diagnosis present

## 2015-11-23 DIAGNOSIS — IMO0001 Reserved for inherently not codable concepts without codable children: Secondary | ICD-10-CM

## 2015-11-23 DIAGNOSIS — I63421 Cerebral infarction due to embolism of right anterior cerebral artery: Principal | ICD-10-CM | POA: Diagnosis present

## 2015-11-23 HISTORY — DX: Essential (primary) hypertension: I10

## 2015-11-23 MED ORDER — CLONIDINE HCL 0.1 MG PO TABS
0.1000 mg | ORAL_TABLET | ORAL | Status: DC | PRN
  Administered 2015-11-23 – 2015-11-29 (×5): 0.1 mg via ORAL
  Filled 2015-11-23 (×6): qty 1

## 2015-11-23 MED ORDER — FLEET ENEMA 7-19 GM/118ML RE ENEM: 1.0000 | ENEMA | Freq: Once | RECTAL | Status: DC | PRN

## 2015-11-23 MED ORDER — PROCHLORPERAZINE 25 MG RE SUPP: 12.5000 mg | Freq: Four times a day (QID) | RECTAL | Status: DC | PRN

## 2015-11-23 MED ORDER — AMLODIPINE BESYLATE 10 MG PO TABS
10.0000 mg | ORAL_TABLET | Freq: Every day | ORAL | Status: DC
  Administered 2015-11-24 – 2015-11-27 (×4): 10 mg via ORAL
  Filled 2015-11-23 (×4): qty 1

## 2015-11-23 MED ORDER — METHOCARBAMOL 500 MG PO TABS
500.0000 mg | ORAL_TABLET | Freq: Four times a day (QID) | ORAL | Status: DC | PRN
  Administered 2015-12-01 – 2015-12-12 (×3): 500 mg via ORAL
  Filled 2015-11-23 (×3): qty 1

## 2015-11-23 MED ORDER — PANTOPRAZOLE SODIUM 40 MG PO TBEC: 40.0000 mg | DELAYED_RELEASE_TABLET | Freq: Every day | ORAL | Status: DC

## 2015-11-23 MED ORDER — FOLIC ACID 1 MG PO TABS
1.0000 mg | ORAL_TABLET | Freq: Every day | ORAL | Status: DC
  Administered 2015-11-24 – 2015-12-20 (×27): 1 mg via ORAL
  Filled 2015-11-23 (×27): qty 1

## 2015-11-23 MED ORDER — VITAMIN B-1 100 MG PO TABS
100.0000 mg | ORAL_TABLET | Freq: Every day | ORAL | Status: DC
  Administered 2015-11-24 – 2015-12-20 (×27): 100 mg via ORAL
  Filled 2015-11-23 (×27): qty 1

## 2015-11-23 MED ORDER — TRAZODONE HCL 50 MG PO TABS
25.0000 mg | ORAL_TABLET | Freq: Every evening | ORAL | Status: DC | PRN
  Administered 2015-11-24 – 2015-11-25 (×2): 50 mg via ORAL
  Filled 2015-11-23 (×2): qty 1

## 2015-11-23 MED ORDER — ALUM & MAG HYDROXIDE-SIMETH 200-200-20 MG/5ML PO SUSP: 30.0000 mL | ORAL | Status: DC | PRN

## 2015-11-23 MED ORDER — ATORVASTATIN CALCIUM 40 MG PO TABS
40.0000 mg | ORAL_TABLET | Freq: Every day | ORAL | Status: DC
  Administered 2015-11-23: 40 mg via ORAL
  Filled 2015-11-23: qty 1

## 2015-11-23 MED ORDER — BISACODYL 10 MG RE SUPP
10.0000 mg | Freq: Every day | RECTAL | Status: DC | PRN
  Administered 2015-11-26 – 2015-12-01 (×2): 10 mg via RECTAL
  Filled 2015-11-23 (×3): qty 1

## 2015-11-23 MED ORDER — ASPIRIN 325 MG PO TABS
325.0000 mg | ORAL_TABLET | Freq: Every day | ORAL | Status: DC
  Administered 2015-11-24 – 2015-12-20 (×27): 325 mg via ORAL
  Filled 2015-11-23 (×27): qty 1

## 2015-11-23 MED ORDER — PANTOPRAZOLE SODIUM 40 MG PO TBEC
40.0000 mg | DELAYED_RELEASE_TABLET | Freq: Every day | ORAL | Status: DC
  Administered 2015-11-23 – 2015-12-19 (×27): 40 mg via ORAL
  Filled 2015-11-23 (×27): qty 1

## 2015-11-23 MED ORDER — SENNOSIDES-DOCUSATE SODIUM 8.6-50 MG PO TABS
1.0000 | ORAL_TABLET | Freq: Every evening | ORAL | Status: DC | PRN
  Filled 2015-11-23: qty 1

## 2015-11-23 MED ORDER — GUAIFENESIN-DM 100-10 MG/5ML PO SYRP: 5.0000 mL | ORAL_SOLUTION | Freq: Four times a day (QID) | ORAL | Status: DC | PRN

## 2015-11-23 MED ORDER — PROCHLORPERAZINE EDISYLATE 5 MG/ML IJ SOLN: 5.0000 mg | Freq: Four times a day (QID) | INTRAMUSCULAR | Status: DC | PRN

## 2015-11-23 MED ORDER — CLONIDINE HCL 0.3 MG PO TABS
0.3000 mg | ORAL_TABLET | Freq: Every evening | ORAL | Status: DC | PRN
  Administered 2015-11-23 – 2015-11-26 (×5): 0.3 mg via ORAL
  Filled 2015-11-23 (×5): qty 1

## 2015-11-23 MED ORDER — NICOTINE 14 MG/24HR TD PT24
14.0000 mg | MEDICATED_PATCH | Freq: Every day | TRANSDERMAL | Status: DC
  Administered 2015-11-24 – 2015-12-20 (×28): 14 mg via TRANSDERMAL
  Filled 2015-11-23 (×29): qty 1

## 2015-11-23 MED ORDER — HYDROCODONE-ACETAMINOPHEN 7.5-325 MG PO TABS
1.0000 | ORAL_TABLET | ORAL | Status: DC | PRN
  Administered 2015-11-23 – 2015-11-27 (×3): 1 via ORAL
  Administered 2015-11-28 (×2): 2 via ORAL
  Administered 2015-11-29: 1 via ORAL
  Administered 2015-11-29: 2 via ORAL
  Administered 2015-11-30 – 2015-12-12 (×16): 1 via ORAL
  Administered 2015-12-13: 2 via ORAL
  Administered 2015-12-14 – 2015-12-17 (×7): 1 via ORAL
  Filled 2015-11-23 (×8): qty 1
  Filled 2015-11-23: qty 2
  Filled 2015-11-23: qty 1
  Filled 2015-11-23: qty 2
  Filled 2015-11-23 (×3): qty 1
  Filled 2015-11-23: qty 2
  Filled 2015-11-23 (×3): qty 1
  Filled 2015-11-23: qty 2
  Filled 2015-11-23 (×3): qty 1
  Filled 2015-11-23: qty 2
  Filled 2015-11-23: qty 1
  Filled 2015-11-23: qty 2
  Filled 2015-11-23 (×4): qty 1
  Filled 2015-11-23: qty 2
  Filled 2015-11-23 (×2): qty 1

## 2015-11-23 MED ORDER — PROCHLORPERAZINE MALEATE 5 MG PO TABS: 5.0000 mg | ORAL_TABLET | Freq: Four times a day (QID) | ORAL | Status: DC | PRN

## 2015-11-23 MED ORDER — ADULT MULTIVITAMIN W/MINERALS CH
1.0000 | ORAL_TABLET | Freq: Every day | ORAL | Status: DC
  Administered 2015-11-24 – 2015-12-20 (×27): 1 via ORAL
  Filled 2015-11-23 (×27): qty 1

## 2015-11-23 MED ORDER — ATORVASTATIN CALCIUM 40 MG PO TABS
40.0000 mg | ORAL_TABLET | Freq: Every day | ORAL | Status: DC
  Administered 2015-11-24 – 2015-12-19 (×26): 40 mg via ORAL
  Filled 2015-11-23 (×26): qty 1

## 2015-11-23 MED ORDER — DIPHENHYDRAMINE HCL 12.5 MG/5ML PO ELIX: 12.5000 mg | ORAL_SOLUTION | Freq: Four times a day (QID) | ORAL | Status: DC | PRN

## 2015-11-23 NOTE — Care Management Note (Signed)
Case Management Note  Patient Details  Name: MARCH WALDROP MRN: [id number] Date of Birth: [birth year]  Subjective/Objective:    Pt medically stable for dc to Inpatient Rehab today.                  Action/Plan: Plan dc to CIR later today when bed available.    Expected Discharge Date:     11/23/2015             Expected Discharge Plan:  [place]  In-House Referral:     Discharge planning Services  CM Consult  Post Acute Care Choice:    Choice offered to:     DME Arranged:    DME Agency:     HH Arranged:    HH Agency:     Status of Service:  Completed, signed off  Medicare Important Message Given:    Date Medicare IM Given:    Medicare IM give by:    Date Additional Medicare IM Given:    Additional Medicare Important Message give by:     If discussed at [place] of Stay Meetings, dates discussed:    Additional Comments:  Reinaldo Raddle, RN, BSN  Trauma/Neuro ICU Case Manager [phone]

## 2015-11-23 NOTE — PMR Pre-admission (Signed)
PMR Admission Coordinator Pre-Admission Assessment  Patient: Taylor Obrien is an 49 y.o., male MRN: [id number] DOB: [birth year] Height: 5\' 5"  (165.1 cm) Weight: 98.567 kg (217 lb 4.8 oz)              Insurance Information HMO:    PPO:      PCP:      IPA:      80/20:      OTHER:  PRIMARY: Medicaid [place] Access in effect as of 123456     Policy#:  [id number]      Subscriber:  self CM Name:       Phone#:      Fax#:  Pre-Cert#:       Employer:  Benefits:  Phone #:      Name:  Eff. Date:      Deduct:       Out of Pocket Max:       Life Max:  CIR:       SNF:  Outpatient:      Co-Pay:  Home Health:       Co-Pay:  DME:      Co-Pay:  Providers:  SECONDARY:        Policy#:       Subscriber:  CM Name:       Phone#:      Fax#:  Pre-Cert#:       Employer:  Benefits:  Phone #:      Name:  Eff. Date:      Deduct:       Out of Pocket Max:       Life Max:  CIR:       SNF:  Outpatient:      Co-Pay:  Home Health:       Co-Pay:  DME:      Co-Pay:   Medicaid Application Date:       Case Manager:  Disability Application Date:       Case Worker:   Emergency Facilities manager Information    Name Relation Home Work Mobile   [place] Spouse [phone]  [phone]   Lewayne Bunting [phone]       Current Medical History  Patient Admitting Diagnosis: Acute R-ACA infarct History of Present Illness: HPI: Taylor Obrien is a 49 y.o. male with history of HTN, depression, chronic pain who was admitted via [facility] on 11/18/15 with acute onset of left sided weakness. CTA showed M2 occlusion. He was treated with TPA and underwent cerebral angio showing occluded R-ACA proximal segment with partial distal leptomeningeal collateralization and R-MCA without filling defects. 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 UDS  positive for THC. Neurology feels that patient with embolic stroke of unknown etiology and 30 day monitor recommended by cardiology as TEE showed moderate LVH with EF 55-60% but no thrombus, ASD or PFO. Patient has had malignant requiring IV labetalol and Neurology recommends permissive HTN but gradually normalize in 5-7 days. Swallow evaluation done and patient started on regular diet. Patient with resultant right sided weakness, left inattention, poor awareness of deficits, poor safety with strong pusher tendencies. Therapy ongoing and working on pre-gait activity a well as [name] stability. CIR mmended for further therapies. Total: 10 NIH    Past Medical History  Past Medical History  Diagnosis Date  . Essential hypertension, benign   . Hyperlipidemia   .  Depression   . Chronic pain   . GERD (gastroesophageal reflux disease)     Family History  family history includes Diabetes in his brother, father, and mother; Hypertension in his brother, father, and mother.  Prior Rehab/Hospitalizations:  Has the patient had major surgery during 100 days prior to admission? No  Current Medications   Current facility-administered medications:  .  acetaminophen (TYLENOL) tablet 650 mg, 650 mg, Oral, Q4H PRN, 650 mg at 11/20/15 1817 **OR** acetaminophen (TYLENOL) suppository 650 mg, 650 mg, Rectal, Q4H PRN, Greta Doom, MD .  amLODipine (NORVASC) tablet 10 mg, 10 mg, Oral, Daily, Garvin Fila, MD, 10 mg at 11/23/15 0919 .  antiseptic oral rinse (CPC / CETYLPYRIDINIUM CHLORIDE 0.05%) solution 7 mL, 7 mL, Mouth Rinse, BID, Greta Doom, MD, 7 mL at 11/23/15 1000 .  aspirin tablet 325 mg, 325 mg, Oral, Daily, Garvin Fila, MD, 325 mg at 11/23/15 0919 .  atorvastatin (LIPITOR) tablet 40 mg, 40 mg, Oral, q1800, Garvin Fila, MD .  cloNIDine (CATAPRES) tablet 0.3 mg, 0.3 mg, Oral, QHS,MR X 1, Garvin Fila, MD, 0.3 mg at 11/22/15 2241 .  folic acid (FOLVITE) tablet 1 mg, 1 mg,  Oral, Daily, Greta Doom, MD, 1 mg at 11/23/15 0919 .  hydrochlorothiazide (HYDRODIURIL) tablet 25 mg, 25 mg, Oral, Daily, Garvin Fila, MD, 25 mg at 11/23/15 0919 .  HYDROmorphone (DILAUDID) injection 2 mg, 2 mg, Intravenous, Q4H PRN, Catha Gosselin, MD, 2 mg at 11/22/15 2041 .  labetalol (NORMODYNE,TRANDATE) injection 10-20 mg, 10-20 mg, Intravenous, Q10 min PRN, Garvin Fila, MD, 20 mg at 11/23/15 0906 .  multivitamin with minerals tablet 1 tablet, 1 tablet, Oral, Daily, Greta Doom, MD, 1 tablet at 11/23/15 0919 .  nicardipine (CARDENE) 20mg  in 0.86% saline 217ml IV infusion (0.1 mg/ml), 3-15 mg/hr, Intravenous, Continuous, Greta Doom, MD, Stopped at 11/21/15 2259 .  nicotine (NICODERM CQ - dosed in mg/24 hours) patch 14 mg, 14 mg, Transdermal, Daily, Garvin Fila, MD, 14 mg at 11/23/15 0920 .  ondansetron (ZOFRAN) injection 4 mg, 4 mg, Intravenous, Q4H PRN, Greta Doom, MD, 4 mg at 11/18/15 1900 .  pantoprazole (PROTONIX) EC tablet 40 mg, 40 mg, Oral, QHS, Eudelia Bunch, RPH .  thiamine (VITAMIN B-1) tablet 100 mg, 100 mg, Oral, Daily, 100 mg at 11/23/15 0919 **OR** [DISCONTINUED] thiamine (B-1) injection 100 mg, 100 mg, Intravenous, Daily, Greta Doom, MD, 100 mg at 11/21/15 1100  Patients Current Diet: Diet regular Room service appropriate?: Yes; Fluid consistency:: Thin  Precautions / Restrictions Precautions Precautions: Fall Restrictions Weight Bearing Restrictions: No   Has the patient had 2 or more falls or a fall with injury in the past year?No  Prior Activity Level Community (5-7x/wk): Pt. was working part time with [employer] 3-5 days per week.  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 None of  the above no device used PTA  Prior Functional Level Prior Function Level of Independence: Independent  Self Care: Did the patient need help bathing, dressing, using the toilet or eating?  Independent  Indoor Mobility: Did the patient need assistance with walking from room to room (with or without device)? Independent  Stairs: Did the patient need assistance with internal or external stairs (with or  without device)? Independent  Functional Cognition: Did the patient need help planning regular tasks such as shopping or remembering to take medications? Independent  Current Functional Level Cognition  Arousal/Alertness: Awake/alert Overall Cognitive Status: Impaired/Different from baseline Current Attention Level: Selective Orientation Level: Oriented X4 Following Commands: Follows one step commands with increased time Safety/Judgement: Decreased awareness of safety, Decreased awareness of deficits General Comments: improved awareness of L visual field Attention: Sustained Sustained Attention: Impaired Sustained Attention Impairment: Verbal basic, Functional basic Memory: Impaired Memory Impairment: Storage deficit, Decreased recall of new information Awareness: Impaired Awareness Impairment: Emergent impairment, Anticipatory impairment Problem Solving: Impaired Problem Solving Impairment: Verbal basic, Functional basic Behaviors: Impulsive Safety/Judgment: Impaired    Extremity Assessment (includes Sensation/Coordination)  Upper Extremity Assessment: [name] deficits/detail [name] Deficits / Details: gross grasp only, no other voluntary movement, L neglect [name] Sensation: decreased light touch, decreased proprioception [name] Coordination: decreased fine motor, decreased gross motor  Lower Extremity Assessment: Defer to PT evaluation [name] Deficits / Details: No active movement noted.  pt flaccid in LE.  Sensation diminished, but does have some sense of pain and ? proprioception.   [name]  Sensation: decreased light touch, decreased proprioception [name] Coordination: decreased fine motor, decreased gross motor    ADLs  Overall ADL's : Needs assistance/impaired Eating/Feeding: Minimal assistance, Bed level Eating/Feeding Details (indicate cue type and reason): assist to open containers and cut food Grooming: Wash/dry hands, Wash/dry face, Oral care, Minimal assistance, Bed level Upper Body Bathing: Maximal assistance, Bed level Lower Body Bathing: Total assistance, Bed level Upper Body Dressing : Maximal assistance, Sitting Lower Body Dressing: Total assistance, Bed level Toilet Transfer: +2 for physical assistance, Maximal assistance, Stand-pivot, BSC Toileting- Clothing Manipulation and Hygiene: +2 for physical assistance, Total assistance, Sit to/from stand General ADL Comments: Focus on resistive push-pull patterns, joint approximation [name] followed by grasp/release transferring object from hand to hand and towel exercises with L UE supported on table. Instructed pt in visual compensation/monitioring L UE. Pt reporting that L UE "feels like it belongs to someone else."      Mobility  Overal bed mobility: Needs Assistance, +2 for physical assistance Bed Mobility: Supine to Sit Supine to sit: Mod assist, +2 for physical assistance General bed mobility comments: cues for sequencing and use of R UE to minimize pushing.      Transfers  Overall transfer level: Needs assistance Equipment used: 2 person hand held assist Transfers: Sit to/from Stand, Stand Pivot Transfers Sit to Stand: Mod assist, +2 physical assistance Stand pivot transfers: Max assist, +2 physical assistance General transfer comment: With cueing, pt with decreased pushing today and better able to participate in transfer towards R side.  Continues to need L LE blocked and increased A to complete pivot.      Ambulation / Gait / Stairs / Office manager / Balance Dynamic Sitting  Balance Sitting balance - Comments: pt strong pusher towards L side.  Needs consistent cues and facilitation for weightshifting towards R and reaching R UE towards R side.  Worked on propping on R elbow while sitting.   Balance Overall balance assessment: Needs assistance Sitting-balance support: Single extremity supported, Feet supported Sitting balance-Leahy Scale: Poor Sitting balance - Comments: pt strong pusher towards L side.  Needs consistent cues and facilitation for weightshifting towards R and reaching R UE towards R side.  Worked on propping on R elbow while sitting.   Postural control: Left lateral lean Standing balance support: No upper extremity supported,  During functional activity Standing balance-Leahy Scale: Poor    Special needs/care consideration BiPAP/CPAP per wife, pt has sleep apnea but does not use his cpap CPM   no Continuous Drip IV  no Dialysis   no        Life Vest   no Oxygen   no Special Bed   no Trach Size   no Wound Vac (area)   no       Skin WDL per nursing assessment                             Bowel mgmt: last BM 11/20/15  Bladder mgmt: urinary catheter Diabetic mgmt     Previous Home Environment Living Arrangements: Spouse/significant other, Children (4 kids)  Lives With:  (4 children) Available Help at Discharge: Family, Available 24 hours/day Type of Home: Apartment Home Layout: One level Home Access: Stairs to enter Entrance Stairs-Rails: Right, Left, Can reach both Entrance Stairs-Number of Steps: 4 Bathroom Shower/Tub: Chiropodist: Standard Home Care Services: No 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  Discharge Living Setting Plans for Discharge Living Setting: [name] home Type of Home at Discharge: Apartment Discharge Home Layout: One level Discharge Home Access: Stairs to enter Entrance Stairs-Rails: Right, Left Entrance Stairs-Number of Steps: 4-5 (steps lead down to apartment) Discharge  Bathroom Shower/Tub: Tub/shower unit Discharge Bathroom Toilet: Standard Discharge Bathroom Accessibility: Yes How Accessible: Accessible via walker Does the patient have any problems obtaining your medications?: No  Social/Family/Support Systems Patient Roles: Spouse, Parent Anticipated Caregiver: wife, Counsellor Anticipated Ambulance person Information: [phone] (cell) Ability/Limitations of Caregiver: no limitations Caregiver Availability: 24/7 Discharge Plan Discussed with Primary Caregiver: Yes Is Caregiver In Agreement with Plan?: Yes Does Caregiver/Family have Issues with Lodging/Transportation while Pt is in Rehab?: No   Goals/Additional Needs Patient/Family Goal for Rehab: min and mod PT; min OT , n/a SLP Expected length of stay: 20-24 days Cultural Considerations: none Dietary Needs: regular diet, thin liquids Equipment Needs: TBA Pt/Family Agrees to Admission and willing to participate: Yes Program Orientation Provided & Reviewed with Pt/Caregiver Including Roles  & Responsibilities: Yes   Decrease burden of Care through IP rehab admission:  no   Possible need for SNF placement upon discharge:   Not anticipated   Patient Condition: This patient's medical and functional status has changed since the consult dated: 11/20/15 in which the Rehabilitation Physician determined and documented that the patient's condition is appropriate for intensive rehabilitative care in an inpatient rehabilitation facility. See "History of Present Illness" (above) for medical update. Functional changes are:  +2 max assist for transfers, improved tracking during therapy session. [name] medical and functional status update has been discussed with the Rehabilitation physician and patient remains appropriate for inpatient rehabilitation. Will admit to inpatient rehab today.  Preadmission Screen Completed By:  Gerlean Ren, 11/23/2015 5:05  PM ______________________________________________________________________   Discussed status with Dr.  Letta Pate on 11/23/15 at  1705  and received telephone approval for admission today.  Admission Coordinator:  Gerlean Ren, time 1705 Sudie Grumbling 11/23/15

## 2015-11-23 NOTE — Progress Notes (Signed)
STROKE TEAM PROGRESS NOTE   HISTORY Taylor Obrien is a 49 y.o. male with a history of htn who presented with acute onset left sided weakness that started right at 9am on the day of admission. He was evaluated at [facility] and had a CTA which shows an M2 occlusion. He has a continued NIH of 9 after tpa and had a lesion possibly amenable to intervention.  Therefore Dr. Estanislado Pandy performed angiogram .  Findings below. 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 The plan is for the patient to go to the inpatient rehabilitation center today.   OBJECTIVE Temp:  [98.1 F (36.7 C)-98.9 F (37.2 C)] 98.6 F (37 C) (12/15 0800) Pulse Rate:  [67-102] 82 (12/15 1100) Cardiac Rhythm:  [-] Normal sinus rhythm (12/15 0800) Resp:  [11-22] 18 (12/15 1100) BP: (117-209)/(80-123) 158/104 mmHg (12/15 1100) SpO2:  [92 %-100 %] 98 % (12/15 1100)  CBC:   Recent Labs Lab 11/18/15 1100 11/18/15 1111  WBC 7.4  --   NEUTROABS 5.4  --   HGB 17.4* 18.4*  HCT 48.7 54.0*  MCV 96.6  --   PLT 172  --     Basic Metabolic Panel:   Recent Labs Lab 11/18/15 1100 11/18/15 1111  NA 137 138  K 3.7 3.7  CL 97* 99*  CO2 27  --   GLUCOSE 140* 135*  BUN 10 8  CREATININE 1.16 1.10  CALCIUM 9.4  --     Lipid Panel:     Component Value Date/Time   CHOL 277* 11/19/2015 0549   TRIG 290* 11/19/2015 0549   HDL 40* 11/19/2015 0549   CHOLHDL 6.9 11/19/2015 0549   VLDL 58* 11/19/2015 0549   LDLCALC 179* 11/19/2015 0549   HgbA1c:  Lab Results  Component Value Date   HGBA1C 5.8* 11/19/2015   Urine Drug Screen:     Component Value Date/Time   LABOPIA NONE DETECTED 11/18/2015 1234   COCAINSCRNUR NONE DETECTED 11/18/2015 1234   LABBENZ NONE DETECTED 11/18/2015 1234   AMPHETMU NONE DETECTED 11/18/2015  1234   THCU POSITIVE* 11/18/2015 1234   LABBARB NONE DETECTED 11/18/2015 1234      IMAGING  Ct Angio Head and Neck W/cm &/or Wo Cm 11/18/2015   1. Limited by venous dominant contrast timing in the distal neck and head.  2. Anterior division right MCA M2 occlusion strongly suspected. The right right ICA and MCA M1 segment are patent.  3. Bilateral carotid bifurcation atherosclerosis without hemodynamically significant stenosis. Negative arch and CCA origins.  4. Vertebral arteries are poorly evaluated in the neck. No posterior circulation occlusion.    Ct Head Wo Contrast 11/18/2015   1. Moderate sized area of cortically based infarct in the medial right superior frontal gyrus compatible with posterior right ACA versus MCA infarct.  2. No associated hemorrhage or mass effect.  3. Otherwise stable noncontrast CT appearance of the brain.    Ct Head Wo Contrast 11/18/2015   Small focus of decreased attenuation in the midline of the upper pons, not present previously. A small brainstem infarct must be of concern given this appearance. Elsewhere gray-white compartments appear normal except for rather minimal small vessel disease adjacent to the frontal horns of the lateral ventricles. No edema is appreciable. No hemorrhage.   S/P 4 vessel  cerebral arteriogram. RT CFA approach. 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  Psych: Affect appropriate to situation Eyes: No scleral injection; PERRL Head: Normocephalic.  Cardiovascular: Normal rate and regular rhythm.  Respiratory: Effort normal and breath sounds normal to anterior ascultation GI: Soft. No distension. There is no tenderness.  Skin: WDI Groin site bandaged and bandage is bloody.  10lb weight on site  Neuro: Mental  Status: Patient is awake, alert, oriented to person, place, month, year, and situation. No signs of aphasia or neglect  Cranial Nerves: II: Pupils are equal, round, and reactive to light.  III,IV, VI: EOMI without ptosis or diploplia.  V: Facial sensation is symmetric to temperature VII: Facial movement is mildly decerased on the left with mild dysarthria.  VIII: hearing is intact to voice XI: Shoulder shrug decreased on the left. XII: tongue is midline without atrophy or fasciculations.   Motor: Tone is normal. Bulk is normal. 5/5 strength on the right, no movement left leg to command, but occasional reflexive movement; He is able to grip but does not have strength proximally.  No effort vs gravity proximally   Sensory: Sensation is decreased on the left   Cerebellar:  Not tested due to lethargy  Gait: Not tested due to line in groin and hemiplegia  ASSESSMENT/PLAN [name] is a 49 y.o. male with history of depression, ongoing tobacco use, hyperlipidemia, chronic pain, and substance abuse presenting with acute onset of left-sided weakness. He received TPA 81 mg on Saturday, 11/18/2015 at 1145.  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  Resultant  Left hemiplegia MRI  1. Large acute right ACA territory infarct. No associated hemorrhage or mass effect. 2. No other acute intracranial abnormality. 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 3. 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    MRA  not ordered see catheter angio  Carotid Doppler  refer to CTA of the neck  2D Echo  Left ventricle: The cavity size was normal. There was moderate concentric hypertrophy. Systolic function was normal. The estimated ejection fraction was in the range of 55% to 60%. There is akinesis of the  basalinferior myocardium.LDL 179  HgbA1c 5.8  VTE prophylaxis - SCDs Diet regular Room service appropriate?: Yes; Fluid consistency:: Thin  No antithrombotic prior to admission, now on No antithrombotic - secondary to TPA.  Patient counseled to be compliant with his antithrombotic medications  Ongoing aggressive stroke risk factor management  Therapy recommendation :Rehab   Disposition - inpatient rehabilitation today.  Hypertension  Stable Permissive hypertension (OK if < 220/120) but gradually normalize in 5-7 days.   Hyperlipidemia  Home meds: No lipid lowering medications prior to admission  LDL 179, goal < 70  Added Lipitor 40 mg daily   Continue statin at discharge   Other Stroke Risk Factors  Cigarette smoker; will need cessation counseling  ETOH use  Obesity, Body mass index is 36.16 kg/(m^2).   Substance abuse history   Other Active Problems  Status post cerebral angiogram without intervention  Status post [facility] day # [place] PA-C [facility] Pager [phone] 11/23/2015, 11:56 AM  I have personally examined this patient, reviewed notes, independently  viewed imaging studies, participated in medical decision making and plan of care. I have made any additions or clarifications directly to the above note. Agree with note above. Patient is neurologically stable to be transferred to inpatient rehabilitation today. Follow-up as an outpatient in stroke clinic in 1-2 months. Antony Contras, MD Medical Director [facility] Pager: [phone] 11/23/2015 5:38 PM    To contact Stroke Continuity provider, please refer to [email or web address] After hours, contact General Neurology

## 2015-11-23 NOTE — H&P (Signed)
Physical Medicine and Rehabilitation Admission H&P   Chief Complaint  Patient presents with  . Left sided weakness, right gaze preference with left inattention    HPI: Taylor Obrien is a 49 y.o. male with history of HTN, depression, chronic pain who was admitted via [facility] on 11/18/15 with acute onset of left sided weakness. CTA showed M2 occlusion. He was treated with TPA and underwent cerebral angio showing occluded R-ACA proximal segment with partial distal leptomeningeal collateralization and R-MCA without filling defects. 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 UDS positive for THC. Neurology feels that patient with embolic stroke of unknown etiology and 30 day monitor recommended by cardiology as TEE showed moderate LVH with EF 55-60% but no thrombus, ASD or PFO. Patient has had malignant requiring IV labetalol and Neurology recommends permissive HTN but gradually normalize in 5-7 days. Swallow evaluation done and patient started on regular diet. Patient with resultant right sided weakness, left inattention, poor awareness of deficits, poor safety with strong pusher tendencies. Therapy ongoing and working on pre-gait activity a well as [name] stability. CIR mmended for further therapies.   "I want to be home by Christmas"  Review of Systems  HENT: Negative for hearing loss.  Eyes: Negative for blurred vision and double vision.  Respiratory: Negative for cough and shortness of breath.  Cardiovascular: Negative for chest pain and palpitations.  Gastrointestinal: Positive for constipation. Negative for heartburn, nausea and abdominal pain.  Genitourinary: Positive for urgency (with incontinence) and frequency.  Musculoskeletal: Positive for back pain (chronic issues) and joint pain (chronic right shoulder issues.  ). Negative for myalgias.  Neurological: Positive for focal weakness. Negative for dizziness, tingling and headaches.  Psychiatric/Behavioral: Negative for depression. The patient has insomnia.      Past Medical History  Diagnosis Date  . Essential hypertension, benign   . Hyperlipidemia   . Depression   . Chronic pain   . GERD (gastroesophageal reflux disease)     Past Surgical History  Procedure Laterality Date  . Right rotator cuff repair    . Closed reduction mandibular fracture w/ arch bars      + multiple extractions  . Removal foreign body right shoulder    . Condyloma resection    . Radiology with anesthesia N/A 11/18/2015    Procedure: RADIOLOGY WITH ANESTHESIA; Surgeon: Luanne Bras, MD; Location: [facility]; Service: Radiology; Laterality: N/A;  . Tee without cardioversion N/A 11/21/2015    Procedure: TRANSESOPHAGEAL ECHOCARDIOGRAM (TEE); Surgeon: Larey Dresser, MD; Location: [facility] ENDOSCOPY; Service: Cardiovascular; Laterality: N/A;    Family History  Problem Relation Age of Onset  . Diabetes Mother   . Hypertension Mother   . Diabetes Father   . Hypertension Father   . Diabetes Brother   . Hypertension Brother      Social History: Married. Lives with wife and 4 children in a first floor apt. 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 Wife laid off couple of months ago and is available 24/7 at discharge. He reports that he has been smoking Cigarettes-1 PPD. He has a 15 pack-year smoking history. He has never used smokeless tobacco. He reports that he drinks three 40 ounce cans of beers/daily. He reports that he uses illicit drugs (Marijuana)---"smokes 2-3 blunts daily"     Allergies  Allergen Reactions  . Amoxicillin   . Oxcarbazepine Other (See Comments)  Patient goes out of right state of mind.   . Penicillins     Medications Prior to  Admission  Medication Sig Dispense Refill  . acetaminophen (TYLENOL) 500 MG tablet Take 1,000 mg by mouth every 6 (six) hours as needed for pain.    Marland Kitchen amLODipine (NORVASC) 10 MG tablet Take 1 tablet (10 mg total) by mouth daily. 30 tablet 0  . cloNIDine (CATAPRES) 0.3 MG tablet Take 0.3 mg by mouth at bedtime.    . hydrochlorothiazide (HYDRODIURIL) 25 MG tablet Take 1 tablet (25 mg total) by mouth daily. 30 tablet 0  . HYDROcodone-acetaminophen (NORCO) 7.5-325 MG tablet Take 1 tablet by mouth every 4 (four) hours as needed for moderate pain.      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  Transfers Overall transfer level: Needs assistance Equipment used: 2 person hand held assist Transfers: Sit to/from Stand, Stand Pivot Transfers Sit to Stand: Mod assist, +2 physical assistance Stand pivot transfers: Max assist, +2 physical assistance General transfer comment: With cueing, pt with decreased pushing today and better able to participate in transfer towards R side. Continues to need L LE blocked and increased A to  complete pivot.       ADL: ADL Overall ADL's : Needs assistance/impaired Eating/Feeding: Minimal assistance, Sitting Eating/Feeding Details (indicate cue type and reason): used cup, spoon and finger fed Grooming: Wash/dry face, Set up, Sitting Upper Body Bathing: Maximal assistance, Bed level Lower Body Bathing: Total assistance, Bed level Upper Body Dressing : Maximal assistance, Sitting Lower Body Dressing: Total assistance, Bed level Toilet Transfer: +2 for physical assistance, Maximal assistance, Stand-pivot, BSC Toileting- Clothing Manipulation and Hygiene: +2 for physical assistance, Total assistance, Sit to/from stand  Cognition: Cognition Overall Cognitive Status: Impaired/Different from baseline Arousal/Alertness: Awake/alert Orientation Level: Oriented X4 Attention: Sustained Sustained Attention: Impaired Sustained Attention Impairment: Verbal basic, Functional basic Memory: Impaired Memory Impairment: Storage deficit, Decreased recall of new information Awareness: Impaired Awareness Impairment: Emergent impairment, Anticipatory impairment Problem Solving: Impaired Problem Solving Impairment: Verbal basic, Functional basic Behaviors: Impulsive Safety/Judgment: Impaired Cognition Arousal/Alertness: Awake/alert Behavior During Therapy: WFL for tasks assessed/performed Overall Cognitive Status: Impaired/Different from baseline Area of Impairment: Attention, Following commands, Safety/judgement Current Attention Level: Selective Following Commands: Follows one step commands with increased time Safety/Judgement: Decreased awareness of safety, Decreased awareness of deficits General Comments: Less cueing needed today to attend to L side.   Physical Exam: Blood pressure 158/104, pulse 82, temperature 98.6 F (37 C), temperature source Oral, resp. rate 18, height 5\' 5"  (1.651 m), weight 98.567 kg (217 lb 4.8 oz), SpO2 98 %. Physical Exam  Nursing note and vitals  reviewed. Constitutional: He is oriented to person, place, and time. He appears well-developed and well-nourished.  HENT:  Head: Normocephalic and atraumatic.  Mouth/Throat: Oropharyngeal exudate present.  Eyes:  EOM are normal. Pupils are equal, round, and reactive to light. Right eye exhibits discharge. Left eye exhibits discharge.  Neck: Normal range of motion. Neck supple.  Cardiovascular: Normal rate and regular rhythm.  No murmur heard. Respiratory: Effort normal and breath sounds normal. No respiratory distress. He has no wheezes.  GI: Soft. Bowel sounds are normal. He exhibits no distension. There is no tenderness. There is no rebound.  Musculoskeletal: He exhibits no edema or tenderness.  Neurological: He is alert and oriented to person, place, and time.  Has poor postural reflexes with tendency to slump to the left. Left inattention but able to turn eyes to left field.  He is impulsive and lacks insight/ awareness of deficits. Able to follow simple one and two step commands. Impulsive with decreased insight into deficits.   Left facial weakness without dysarthria.  Motor: RUE/RLE: 5/5 proximal to distal [name]: 0/5 shoulder abduction, elbow flex/ext, 3/5 finger grip [name]: 0/5 proximal to distal  Skin: Skin is warm and dry. No rash noted. No erythema.  Psychiatric: He has a normal mood and affect. His speech is normal. He expresses impulsivity.     Lab Results Last 48 Hours    No results found for this or any previous visit (from the past 48 hour(s)).    Imaging Results (Last 48 hours)    No results found.       Medical Problem List and Plan: 1. 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 DVT Prophylaxis/Anticoagulation: Mechanical: Sequential compression devices, below knee Bilateral lower extremities 3. Pain Management: Denies any HA today. Will order hydrocodone prn for pain 4. Mood: LCSW to follow for evaluation and support. Has supportive girlfriend.   5. Neuropsych: This patient is not fully capable of making decisions on his own behalf. 6. Skin/Wound Care: Routine pressure relief measures. Maintain adequate nutrition and hydration status.  7. Fluids/Electrolytes/Nutrition: Monitor I/O. Appetite remains good. Check lytes in am. 8. HTN: Continue HCTZ and Norvasc daily. Is also getting catapres 0.3 mg with repeat doses at nights. Monitor every 6 hours as still labile requiring IV labetalol. Will add catapres prn additionally for SBP > 180 or DBP >105 as per Neurology parameters. 9. Dyslipidemia: On Lipitor.  10. H/o depression with anxiety: Used to see mental health in the past. Has been off meds for years.       Post Admission Physician Evaluation: 1. Functional deficits secondary to Left Hemiparesis secondary to right anterior cerebral artery infarct. 2. Patient is admitted to receive collaborative, interdisciplinary care between the physiatrist, rehab nursing staff, and therapy team. 3. [name] level of medical complexity and substantial therapy needs in context of that medical necessity cannot be provided at a lesser intensity of care such as a SNF. 4. Patient has experienced substantial functional loss from his/her baseline which was documented above under the "Functional History" and "Functional Status" headings. Judging by the patient's diagnosis, physical exam, and functional history, the patient has potential for functional progress which will result in measurable gains while on inpatient rehab. These gains will be of substantial and practical use upon discharge in facilitating mobility and self-care at the household level. 5. Physiatrist will provide 24 hour management of medical needs as well as oversight of the therapy plan/treatment and provide guidance as appropriate regarding the interaction of the two. 6. 24 hour rehab nursing will assist with bladder management, bowel management, safety, skin/wound care, disease  management, medication administration, pain management and patient education and help integrate therapy concepts, techniques,education,  etc. 7. PT will assess and treat for/with: pre gait, gait training, endurance , safety, equipment, neuromuscular re education. Goals are: Sup/MinA. 8. OT will assess and treat for/with: ADLs, Cognitive perceptual skills, Neuromuscular re education, safety, endurance, equipment. Goals are: Min assist to supervision for ADLs, maximize neuromuscular reeducation left upper extremity. Therapy may proceed with showering this patient. 9. SLP will assess and treat for/with: Evaluate  attention, concentration, problem solving. Goals are: Supervision medication management.Increase awareness of deficits 10. Case Management and Social Worker will assess and treat for psychological issues and discharge planning. 11. Team conference will be held weekly to assess progress toward goals and to determine barriers to discharge. 12. Patient will receive at least 3 hours of therapy per day at least 5 days per week. 13. ELOS: 20-22 days  14. Prognosis: good     Charlett Blake M.D. [facility] FAAPM&R (Sports Med, Neuromuscular Med) Diplomate [place] Board of Electrodiagnostic Med  11/23/2015

## 2015-11-23 NOTE — Progress Notes (Signed)
[name], MD Physician Addendum Physical Medicine and Rehabilitation Consult Note 11/20/2015 12:29 PM  Related encounter: ED to Hosp-Admission (Current) from 11/18/2015 in [place] [facility]    Expand All Collapse All        Physical Medicine and Rehabilitation Consult   Reason for Consult: Left sided weakness, right gaze preference with left inattention. Referring Physician: Dr. Leonie Man.   HPI: Taylor Obrien is a 49 y.o. male with history of HTN, depression, chronic pain who was admitted via [facility] on 11/18/15 with acute onset of left sided weakness. CTA showed M2 occlusion. He was treated with TPA and underwent cerebral angio showing occluded R-ACA proximal segment with partial distal leptomeningeal collateralization and R-MCA without filling defects. 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 UDS positive for THC. Neurology feels that patient with embolic stroke of unknown etiology and work up underway. Therapy evaluations initiated and patient limited by right sided weakness, left inattention with right gaze preference, lacks awareness of deficits, poor safety with strong pusher tendency and needed BLE blocked for transfers. CIR recommended for further therapies.   Review of Systems  Eyes: Negative for blurred vision and double vision.  Respiratory: Negative for cough, sputum production and shortness of breath.  Cardiovascular: Negative for chest pain, palpitations and leg swelling.  Gastrointestinal: Positive for heartburn. Negative for nausea and vomiting.  Musculoskeletal: Positive for myalgias, back pain and joint pain (chronic right shoulder).  Skin: Negative for rash.  Neurological: Positive for focal weakness and headaches. Negative for dizziness and tingling.    Psychiatric/Behavioral: Negative for memory loss. The patient does not have insomnia.  All other systems reviewed and are negative.    Past Medical History  Diagnosis Date  . Essential hypertension, benign   . Hyperlipidemia   . Depression   . Chronic pain   . GERD (gastroesophageal reflux disease)     Past Surgical History  Procedure Laterality Date  . Right rotator cuff repair    . Closed reduction mandibular fracture w/ arch bars      + multiple extractions  . Removal foreign body right shoulder    . Condyloma resection    . Radiology with anesthesia N/A 11/18/2015    Procedure: RADIOLOGY WITH ANESTHESIA; Surgeon: Luanne Bras, MD; Location: [facility]; Service: Radiology; Laterality: N/A;    Family History  Problem Relation Age of Onset  . Diabetes Mother   . Hypertension Mother   . Diabetes Father   . Hypertension Father   . Diabetes Brother   . Hypertension Brother     Social History: Married. Lives with wife and 4 children in a first floor apt. 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 Wife laid off couple of months ago and is available 24/7 at discharge. He reports that he has been smoking Cigarettes-1 PPD. He has a 15 pack-year smoking history. He has never used smokeless tobacco. He reports that he drinks three 40 ounce cans of beers/daily. He reports that he uses illicit drugs (Marijuana)---"smokes 2-3 blunts daily"    Allergies  Allergen Reactions  . Amoxicillin   . Oxcarbazepine Other (See Comments)    Patient goes out of right state of mind.   . Penicillins     Medications Prior to Admission  Medication Sig Dispense Refill  . acetaminophen (TYLENOL) 500 MG tablet Take 1,000 mg by mouth  every 6 (six) hours as needed for pain.    Marland Kitchen amLODipine (NORVASC) 10 MG tablet Take 1 tablet (10 mg total) by mouth daily. 30 tablet 0  . cloNIDine  (CATAPRES) 0.3 MG tablet Take 0.3 mg by mouth at bedtime.    . hydrochlorothiazide (HYDRODIURIL) 25 MG tablet Take 1 tablet (25 mg total) by mouth daily. 30 tablet 0  . HYDROcodone-acetaminophen (NORCO) 7.5-325 MG tablet Take 1 tablet by mouth every 4 (four) hours as needed for moderate pain.      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 pt strong pusher with R UE and trunk.  Transfers Overall transfer level: Needs assistance Equipment used: 2 person hand held assist Transfers: Sit to/from Stand, Stand Pivot Transfers Sit to Stand: Max assist, +2 physical assistance Stand pivot transfers: Total assist, +2 physical assistance General transfer comment: pt needs A positioning Bil LEs prior to attempting to stand. Blocking Bil LEs and strong facilitation for weightshifting towards R side as pt continues to be strong pusher to L side. Total A to complete transfer towards R side.       ADL:    Cognition: Cognition Overall Cognitive Status: Impaired/Different from baseline Orientation Level: Oriented  X4 Cognition Arousal/Alertness: Awake/alert Behavior During Therapy: WFL for tasks assessed/performed Overall Cognitive Status: Impaired/Different from baseline Area of Impairment: Attention, Following commands, Safety/judgement Current Attention Level: Selective Following Commands: Follows one step commands with increased time Safety/Judgement: Decreased awareness of safety, Decreased awareness of deficits General Comments: pt with L sided Neglect and strong gaze preference to R side. pt with decreased awareness of L sided weakness and safety with mobility.    Blood pressure 182/111, pulse 80, temperature 98.9 F (37.2 C), temperature source Oral, resp. rate 15, height 5\' 5"  (1.651 m), weight 98.567 kg (217 lb 4.8 oz), SpO2 94 %. Physical Exam  Nursing note and vitals reviewed. Constitutional: He is oriented to person, place, and time. He appears well-developed and well-nourished.  HENT:  Head: Normocephalic.  Right Ear: External ear normal.  Left Ear: External ear normal.  Foam dressing on scalp  Eyes: Pupils are equal, round, and reactive to light. Scleral icterus is present.  Neck: Normal range of motion. Neck supple.  Cardiovascular: Normal rate and regular rhythm.  Respiratory: Effort normal. No respiratory distress. He has no wheezes.  Upper airway sounds  GI: Soft. Bowel sounds are normal. He exhibits no distension. There is no tenderness.  Musculoskeletal: He exhibits no edema or tenderness.  PROM WNL  Neurological: He is alert and oriented to person, place, and time. He has normal reflexes.  Slumped to the left in the chair and unable to correct even with max verbal/tactile cues.  Left inattention but able to turn eyes to left field.  He  is impulsive and lacks insight/ awareness of deficits. Able to follow simple one and two step commands but with perseverative behaviors requiring redirection.  Left facial weakness Motor: RUE/RLE: 5/5 proximal to distal [name]: 0/5  shoulder abduction, elbow flex/ext, 3/5 finger grip [name]: 0/5 proximal to distal  Skin: Skin is warm and dry.  Foam dressing on scalp  Psychiatric: His affect is inappropriate. His speech is not slurred. He is slowed. He expresses impulsivity. He is inattentive.     Lab Results Last 24 Hours    Results for orders placed or performed during the hospital encounter of 11/18/15 (from the past 24 hour(s))  Troponin I (q 6hr x 3) Status: Abnormal   Collection Time: 11/20/15 10:38 AM  Result Value Ref Range   Troponin I 0.07 (H) <0.031 ng/mL     Ct Angio Head W/cm &/or Wo Cm  11/18/2015 ADDENDUM REPORT: 11/18/2015 14:02 ADDENDUM: Study discussed by telephone with Dr. Roland Rack on 11/18/2015 at 1357 hours. At this time the patient is in route [facility]. Electronically Signed By: Genevie Ann M.D. On: 11/18/2015 14:02  11/18/2015 CLINICAL DATA: 49 year old male code stroke. Left side weakness. Initial encounter. EXAM: CT ANGIOGRAPHY HEAD AND NECK TECHNIQUE: Multidetector CT imaging of the head and neck was performed using the standard protocol during bolus administration of intravenous contrast. Multiplanar CT image reconstructions and MIPs were obtained to evaluate the vascular anatomy. Carotid stenosis measurements (when applicable) are obtained utilizing NASCET criteria, using the distal internal carotid diameter as the denominator. CONTRAST: 58mL OMNIPAQUE IOHEXOL 350 MG/ML SOLN COMPARISON: Head CT without contrast 1107 hours today. And earlier Cervical spine MRI 12/16/2008. FINDINGS: CTA NECK Skeleton: Chronic odontoid fracture or os odontoidium, unchanged from 2010 No acute osseous abnormality identified. Visualized paranasal sinuses and mastoids are clear. Other neck: Cardiomegaly. No pericardial effusion. Negative visualized lung parenchyma. No superior mediastinal lymphadenopathy. Streak artifact at the thoracic inlet related to large body habitus.  Thyroid, larynx, pharynx, parapharyngeal spaces, retropharyngeal space, sublingual space, submandibular glands, and parotid glands are within normal limits. No cervical lymphadenopathy. Aortic arch: Suboptimal arterial contrast bolus timing distal to the common carotid arteries. Negative visualized thoracic aorta. There is coronary artery calcified plaque and/or stent noted. No great vessel origin stenosis. Bovine type arch configuration. Right carotid system: Streak artifact at the thoracic inlet degrading detail of the proximal right CCA which appears normal. Suboptimal contrast bolus as the vessel approaches the right carotid bifurcation. At the bifurcation there is calcified plaque, but no hemodynamically significant stenosis. Negative cervical right ICA otherwise. Left carotid system: Bovine type arch configuration otherwise negative left CCA origin. Suboptimal contrast bolus beginning just proximal to the carotid bifurcation. Soft and calcified plaque at the carotid bifurcation but no proximal left ICA stenosis results. More distal cervical left ICA appears within normal limits. Vertebral arteries:No proximal subclavian artery stenosis. Detail of both vertebral artery origins and V1 segments is degraded by streak artifact. Both V2 segments are patent in the neck and appear codominant. CTA HEAD Posterior circulation: Suboptimal intracranial contrast, venous contrast phase is dominant. Both distal vertebral arteries are patent. beyond the left PICA origin the right is dominant. No distal vertebral artery stenosis identified. No definite basilar artery stenosis. Fetal type right PCA origin. Left PCA origin within normal limits. No proximal PCA occlusion. Anterior circulation: Suboptimal intracranial contrast, primarily in the venous phase. Moderate calcified siphon plaque. Both ICA siphons remain patent. Both carotid termini are patent. Both MCA and ACA origins are patent. Tortuous proximal  ACAs. Both proximal  and mid A2 segments are patent. Left MCA M1 segment and left MCA bifurcation are patent. Right MCA M1 segment is patent, but there is asymmetric decreased enhancement in the anterior right MCA division. Venous sinuses: Patent. Anatomic variants: Fetal type right PCA origin. No changes of acute cortically based infarct are identified on this study. IMPRESSION: 1. Limited by venous dominant contrast timing in the distal neck and head. 2. Anterior division right MCA M2 occlusion strongly suspected. The right right ICA and MCA M1 segment are patent. 3. Bilateral carotid bifurcation atherosclerosis without hemodynamically significant stenosis. Negative arch and CCA origins. 4. Vertebral arteries are poorly evaluated in the neck. No posterior circulation occlusion. Electronically Signed: By: Genevie Ann M.D. On: 11/18/2015 13:56   Ct Head Wo Contrast  11/18/2015 CLINICAL DATA: 49 year old male code stroke status post neuro intervention. Initial encounter. EXAM: CT HEAD WITHOUT CONTRAST TECHNIQUE: Contiguous axial images were obtained from the base of the skull through the vertex without intravenous contrast. COMPARISON: CTA 1319 hours today. Noncontrast head CT 1107 hours today. FINDINGS: No acute intracranial hemorrhage identified. No midline shift, mass effect, or evidence of intracranial mass lesion. There is some residual intra-arterial contrast, most apparent in the venous sinuses. There is cytotoxic edema in the medial right superior frontal gyrus (series 2, image 23). No other changes of acute cortically based infarct are identified. Gray-white matter differentiation elsewhere is stable. No acute osseous abnormality identified. Stable paranasal sinuses and mastoids. Stable orbit and scalp soft tissues. IMPRESSION: 1. Moderate sized area of cortically based infarct in the medial right superior frontal gyrus compatible with posterior right ACA versus MCA infarct. 2. No associated hemorrhage or mass effect. 3.  Otherwise stable noncontrast CT appearance of the brain. Electronically Signed By: Genevie Ann M.D. On: 11/18/2015 20:29     Mr Brain Wo Contrast  11/19/2015 CLINICAL DATA: 49 year old male presented with acute onset left side weakness, suspected right M2 branch occlusion on CTA. Status post IV tPA cerebral angiogram revealing right ACA A2 occlusion, no right MCA defect. Initial encounter. EXAM: MRI HEAD WITHOUT CONTRAST TECHNIQUE: Multiplanar, multiecho pulse sequences of the brain and surrounding structures were obtained without intravenous contrast. COMPARISON: Post angiogram CT head 11/18/2015 at 2003 hours, and earlier. Including cervical spinal MRI 12/16/2008 FINDINGS: Confluent restricted diffusion throughout the posterior right ACA territory encompassing an area of 9 cm long, and extending from the right corpus callosum cephalad to the right superior frontal gyrus. Diffusion abnormality extends to the right splenium. Associated cytotoxic edema with T2 and FLAIR hyperintensity. Gyral swelling with no associated hemorrhage. No significant intracranial mass effect. No other convincing restricted diffusion. Major intracranial vascular flow voids are preserved. No ventriculomegaly. Negative pituitary. There is a chronic lacunar infarct in the central pons (series 8, image 10). This is new since 2010. Chronic focal spinal cord myelomalacia re- identified at the odontoid level. Chronically abnormal odontoid compatible with ununited remote fracture. Chronic lacunar infarct of the left caudate or caudothalamic groove also suspected (series 11, image 17). No chronic cerebral blood products. Aside from these an the acute findings there is mild to moderate for age nonspecific white matter (mostly periventricular) T2 and FLAIR hyperintensity. Visible internal auditory structures appear normal. Normal bone marrow signal. Trace mastoid fluid. Negative paranasal sinuses. Orbit and scalp soft tissues are within  normal limits. IMPRESSION: 1. Large acute right ACA territory infarct. No associated hemorrhage or mass effect. 2. No other acute intracranial abnormality. 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 3. 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 Electronically Signed By: Genevie Ann M.D. On: 11/19/2015 13:38    Assessment/Plan: Diagnosis: Acute R-ACA infarct Labs and images independently reviewed. Records reviewed and summated above. Stroke: Continue secondary stroke prophylaxis and Risk Factor Modification listed below:  Antiplatelet therapy Blood Pressure Management: Continue current medication with prn's with permisive HTN per primary team Statin Agent Substance abuse: Cont to counsel Left sided hemiparesis: fit for orthotics to prevent contractures (resting hand splint for day, wrist cock up splint at night, PRAFO, etc)  Motor recovery: Fluoxetine  1. Does the need for close, 24 hr/day medical supervision in concert with the patient's rehab needs make it unreasonable for this patient to be served in a less intensive setting? Yes  2. Co-Morbidities requiring supervision/potential complications: HTN (monitor and provide prns in accordance with increased physical exertion and pain), depression (ensure mood does not limit functional progress; consider medications if warranted), chronic pain (biofeedback training with therapies to help reduce reliance on opiate pain medications, monitor pain control during therapies, and sedation at rest and titrate to maximum efficacy to ensure participation and gains in therapies), substance abuse (marijuana, tobacco, Etoh - cont to counsel, watch for withdrawals, CIWA) 3. Due to safety, skin/wound care, disease management, medication administration, pain management and patient education, does the patient  require 24 hr/day rehab nursing? Yes 4. Does the patient require coordinated care of a physician, rehab nurse, PT (1-2 hrs/day, 5 days/week) and OT (1-2 hrs/day, 5 days/week) to address physical and functional deficits in the context of the above medical diagnosis(es)? Yes Addressing deficits in the following areas: balance, endurance, locomotion, strength, transferring, bathing, dressing, grooming, toileting and psychosocial support 5. Can the patient actively participate in an intensive therapy program of at least 3 hrs of therapy per day at least 5 days per week? Yes 6. The potential for patient to make measurable gains while on inpatient rehab is excellent 7. Anticipated functional outcomes upon discharge from inpatient rehab are min assist and mod assist with PT, min assist with OT, n/a with SLP. 8. Estimated rehab length of stay to reach the above functional goals is: 20-24 days. 9. Does the patient have adequate social supports and living environment to accommodate these discharge functional goals? Yes 10. Anticipated D/C setting: Home 11. Anticipated post D/C treatments: HH therapy and Home excercise program 12. Overall Rehab/Functional Prognosis: good  RECOMMENDATIONS: This patient's condition is appropriate for continued rehabilitative care in the following setting: CIR Patient has agreed to participate in recommended program. Potentially Note that insurance prior authorization may be required for reimbursement for recommended care.  Comment: Rehab Admissions Coordinator to follow up.  Delice Lesch, MD 11/20/2015       Revision History     Date/Time User Provider Type Action   11/20/2015 3:36 PM [name], MD Physician Addend   11/20/2015 1:50 PM [name], MD Physician Sign   11/20/2015 1:14 PM Bary Leriche, PA-C Physician Assistant [place]   View Details Report       Routing History

## 2015-11-23 NOTE — Discharge Summary (Signed)
Stroke Discharge Summary  Patient ID: Taylor Obrien   MRN: [id number]      DOB: 1966/07/26  Date of Admission: 11/18/2015 Date of Discharge: 11/23/2015  Attending Physician:  Garvin Fila, MD, Stroke MD  Consulting Physician(s):  Treatment Team:  [facility] Stroke, MD cardiology - Dr. Thompson Grayer - 30 day cardiac event monitor recommended Physical medicine and rehabilitation - Dr. Posey Pronto  [name] PCP:  PR[facility] NOT [facility]  Discharge Diagnoses: Large acute right ACA territory infarct. 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  Past Medical History  Diagnosis Date  . Essential hypertension, benign   . Hyperlipidemia   . Depression   . Chronic pain   . GERD (gastroesophageal reflux disease)    Past Surgical History  Procedure Laterality Date  . Right rotator cuff repair    . Closed reduction mandibular fracture w/ arch bars      + multiple extractions  . Removal foreign body right shoulder    . Condyloma resection    . Radiology with anesthesia N/A 11/18/2015    Procedure: RADIOLOGY WITH ANESTHESIA;  Surgeon: Luanne Bras, MD;  Location: [facility];  Service: Radiology;  Laterality: N/A;  . Tee without cardioversion N/A 11/21/2015    Procedure: TRANSESOPHAGEAL ECHOCARDIOGRAM (TEE);  Surgeon: Larey Dresser, MD;  Location: [facility];  Service: Cardiovascular;  Laterality: N/A;    Medications to be continued on Rehab . amLODipine  10 mg Oral Daily  .  antiseptic oral rinse  7 mL Mouth Rinse BID  . aspirin  325 mg Oral Daily  . atorvastatin  40 mg Oral q1800  . cloNIDine  0.3 mg Oral QHS,MR X 1  . folic acid  1 mg Oral Daily  . hydrochlorothiazide  25 mg Oral Daily  . multivitamin with minerals  1 tablet Oral Daily  . nicotine  14 mg Transdermal Daily  . pantoprazole  40 mg Oral QHS  . thiamine  100 mg Oral Daily    LABORATORY STUDIES CBC    Component Value Date/Time   WBC 7.4 11/18/2015 1100   RBC 5.04 11/18/2015 1100   HGB 18.4* 11/18/2015 1111   HCT 54.0* 11/18/2015 1111   PLT 172 11/18/2015 1100   MCV 96.6 11/18/2015 1100   MCH 34.5* 11/18/2015 1100   MCHC 35.7 11/18/2015 1100   RDW 13.1 11/18/2015 1100   LYMPHSABS 1.4 11/18/2015 1100   MONOABS 0.5 11/18/2015 1100   EOSABS 0.1 11/18/2015 1100   BASOSABS 0.0 11/18/2015 1100   CMP    Component Value Date/Time   NA 138 11/18/2015 1111   K 3.7 11/18/2015 1111   CL 99* 11/18/2015 1111   CO2 27 11/18/2015 1100   GLUCOSE 135* 11/18/2015 1111   BUN 8 11/18/2015 1111   CREATININE 1.10 11/18/2015 1111   CALCIUM 9.4 11/18/2015 1100   PROT 8.0 11/18/2015 1100   ALBUMIN 4.1 11/18/2015 1100   AST 90* 11/18/2015 1100   ALT 64* 11/18/2015 1100  ALKPHOS 57 11/18/2015 1100   BILITOT 1.2 11/18/2015 1100   GFRNONAA >60 11/18/2015 1100   GFRAA >60 11/18/2015 1100   COAGS Lab Results  Component Value Date   INR 0.98 11/18/2015   INR 0.97 10/09/2012   Lipid Panel    Component Value Date/Time   CHOL 277* 11/19/2015 0549   TRIG 290* 11/19/2015 0549   HDL 40* 11/19/2015 0549   CHOLHDL 6.9 11/19/2015 0549   VLDL 58* 11/19/2015 0549   LDLCALC 179* 11/19/2015 0549   HgbA1C  Lab Results  Component Value Date   HGBA1C 5.8* 11/19/2015   Cardiac Panel (last 3 results)   Recent Labs  11/20/15 2140  TROPONINI 0.08*   Urinalysis    Component Value Date/Time   COLORURINE YELLOW 11/18/2015 [place] 11/18/2015 1234   LABSPEC 1.020 11/18/2015 1234    PHURINE 6.5 11/18/2015 1234   GLUCOSEU 100* 11/18/2015 1234   HGBUR SMALL* 11/18/2015 1234   [place] 11/18/2015 [place] 11/18/2015 1234   PROTEINUR >300* 11/18/2015 1234   UROBILINOGEN 0.2 09/22/2015 0750   NITRITE NEGATIVE 11/18/2015 1234   LEUKOCYTESUR NEGATIVE 11/18/2015 1234   Urine Drug Screen     Component Value Date/Time   LABOPIA NONE DETECTED 11/18/2015 1234   COCAINSCRNUR NONE DETECTED 11/18/2015 1234   LABBENZ NONE DETECTED 11/18/2015 1234   AMPHETMU NONE DETECTED 11/18/2015 1234   THCU POSITIVE* 11/18/2015 1234   LABBARB NONE DETECTED 11/18/2015 1234    Alcohol Level    Component Value Date/Time   ETH <5 11/18/2015 1100     SIGNIFICANT DIAGNOSTIC STUDIES  Ct Angio Head and Neck W/cm &/or Wo Cm 11/18/2015  1. Limited by venous dominant contrast timing in the distal neck and head.  2. Anterior division right MCA M2 occlusion strongly suspected. The right right ICA and MCA M1 segment are patent.  3. Bilateral carotid bifurcation atherosclerosis without hemodynamically significant stenosis. Negative arch and CCA origins.  4. Vertebral arteries are poorly evaluated in the neck. No posterior circulation occlusion.    Ct Head Wo Contrast 11/18/2015  1. Moderate sized area of cortically based infarct in the medial right superior frontal gyrus compatible with posterior right ACA versus MCA infarct.  2. No associated hemorrhage or mass effect.  3. Otherwise stable noncontrast CT appearance of the brain.    Ct Head Wo Contrast 11/18/2015  Small focus of decreased attenuation in the midline of the upper pons, not present previously. A small brainstem infarct must be of concern given this appearance. Elsewhere gray-white compartments appear normal except for rather minimal small vessel disease adjacent to the frontal horns of the lateral ventricles. No edema is appreciable. No hemorrhage.    MRI of the brain without  contrast 11/19/2015 1. Large acute right ACA territory infarct. No associated hemorrhage or mass effect. 2. No other acute intracranial abnormality. 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 3. 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      S/P 4 vessel cerebral arteriogram. RT CFA approach. 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 Topical anesthesia was obtained using benzocaine spray. A transesophageal probe was inserted by the attending cardiologist. Image quality was poor. This was a technically difficult study. The study  was technically difficult, as a result of inadequate probe contact. Intravenous contrast (agitated saline) was administered. - Left ventricle: The cavity size was normal. Wall thickness was increased in a pattern of moderate to severe LVH. Systolic function was normal. The estimated ejection fraction was in the range of 55% to 60%. Wall motion was normal; there were no regional wall motion abnormalities. - Aortic valve: There was no stenosis. - Aorta: Normal caliber aorta with minimal plaque. - Mitral valve: There was no significant regurgitation. - Left atrium: The atrium was mildly dilated. No evidence of thrombus in the atrial cavity or appendage. - Right ventricle: The cavity size was normal. Systolic function was normal. - Right atrium: No evidence of thrombus in the atrial cavity or appendage. - Atrial septum: No defect or patent foramen ovale was identified. Echo contrast study showed no right-to-left atrial level shunt, at baseline or with  provocation. Impressions: - No cardiac source of emboli was indentified.   2-D echocardiogram 11/20/2015 Study Conclusions - Left ventricle: The cavity size was normal. There was moderate concentric hypertrophy. Systolic function was normal. The estimated ejection fraction was in the range of 55% to 60%. There is akinesis of the basalinferior myocardium. Features are consistent with a pseudonormal left ventricular filling pattern, with concomitant abnormal relaxation and increased filling pressure (grade 2 diastolic dysfunction). Doppler parameters are consistent with high ventricular filling pressure. - Left atrium: The atrium was severely dilated.    HISTORY OF PRESENT ILLNES SAW PERELLI is a 49 y.o. male with a history of htn who presented with acute onset left sided weakness that started right at 9am on the day of admission. He was evaluated at [facility] and had a CTA which shows an M2 occlusion. He had a continued NIH of 9 after tpa and had a lesion possibly amenable to intervention. Therefore Dr. Estanislado Pandy performed angiogram . Findings below.  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 ERICO DENTE is a 49 y.o. male with history of depression, ongoing tobacco use, hyperlipidemia, chronic pain, and substance abuse presenting with acute onset of left-sided weakness. He received TPA 81 mg on Saturday, 11/18/2015 at 1145.  Stroke: Non-dominant infarct embolic secondary to an unknown source.  Resultant Left hemiplegia  MRI- as above  MRA not ordered see catheter angio  Carotid Doppler refer to CTA of the neck  2D Echo and TEE as noted above  LDL 179  HgbA1c 5.8  VTE prophylaxis - SCDs  Diet NPO time specified  No antithrombotic prior to admission, now on aspirin 325 mg  daily  Patient counseled to be compliant with his antithrombotic medications  Ongoing aggressive stroke risk factor management  Therapy recommendations: Inpatient rehabilitation admission  Disposition: Admitted to the inpatient rehabilitation center at [facility]  Hypertension  Stable  Permissive hypertension (OK if < 220/120) but gradually normalize in 5-7 days; however, currently has malignant hypertension and requires Nicardipine for BP control  Hyperlipidemia  Home meds: No lipid lowering medications prior to admission  LDL 179, goal < 70  Added Lipitor 40 mg daily  Continue statin at discharge   Other Stroke Risk Factors  Cigarette smoker; will need cessation counseling  ETOH use  Obesity, Body mass index is 36.16 kg/(m^2).   Substance abuse history   Other Active Problems  Status post cerebral angiogram without intervention  Status  post TPA  DISCHARGE EXAM Blood pressure 139/105, pulse 84, temperature 99.8 F (37.7 C), temperature source Oral, resp. rate 23, height 5\' 5"  (1.651 m), weight 98.567 kg (217 lb 4.8 oz), SpO2 97 %.  Constitutional: Appears well-developed and well-nourished.  Psych: Affect appropriate to situation Eyes: No scleral injection; PERRL Head: Normocephalic.  Cardiovascular: Normal rate and regular rhythm.  Respiratory: Effort normal and breath sounds normal to anterior ascultation GI: Soft. No distension. There is no tenderness.  Skin: WDI Groin site bandaged and bandage is bloody. 10lb weight on site  Neuro: Mental Status: Patient is awake, alert, oriented to person, place, month, year, and situation. No signs of aphasia or neglect  Cranial Nerves: II: Pupils are equal, round, and reactive to light.  III,IV, VI: EOMI without ptosis or diploplia.  V: Facial sensation is symmetric to temperature VII: Facial movement is mildly decerased on the left with mild dysarthria.  VIII: hearing is intact to  voice XI: Shoulder shrug decreased on the left. XII: tongue is midline without atrophy or fasciculations.   Motor: Tone is normal. Bulk is normal. 5/5 strength on the right, no movement left leg to command, but occasional reflexive movement; He is able to grip but does not have strength proximally. No effort vs gravity proximally   Sensory: Sensation is decreased on the left  Cerebellar: Not tested due to lethargy  Gait: Not tested due to line in groin and hemiplegia  Discharge Diet  Diet regular Room service appropriate?: Yes; Fluid consistency:: Thin liquids  DISCHARGE PLAN  Disposition:  Transfer to [facility] for ongoing PT, OT and ST  aspirin 325 mg daily for secondary stroke prevention.  Recommend ongoing risk factor control by Primary Care Physician at time of discharge from inpatient rehabilitation.  Follow-up PROVIDER NOT IN SYSTEM in 2 weeks following discharge from rehab.  Follow-up with Dr. Antony Contras, Stroke Clinic in 2 months.   25 minutes were spent preparing discharge.  Mikey Bussing PA-C [facility] Pager [phone] 11/23/2015, 5:40 PM  I have personally examined this patient, reviewed notes, independently viewed imaging studies, participated in medical decision making and plan of care. I have made any additions or clarifications directly to the above note. Agree with note above.   Antony Contras, MD Medical Director [facility] Pager: [phone] 12/12/2015 8:35 AM

## 2015-11-23 NOTE — Progress Notes (Signed)
Occupational Therapy Treatment Patient Details Name: Taylor Obrien MRN: [id number] DOB: [birth year] Today's Date: 11/23/2015    History of present illness pt presents with Posterior R ACA Infarct s/p Cerebral Angiogram.  pt with hx of HTN, Depression, and Polysubstance.     OT comments  Pt able to self feed with assist to cut food and open containers.  Locating ADL and food items in L visual field. Excellent participation in neuromuscular rehab activities. Pt is eager to go rehab and regain independence.  Follow Up Recommendations  CIR;Supervision/Assistance - 24 hour    Equipment Recommendations  3 in 1 bedside comode;Tub/shower bench    Recommendations for Other Services      Precautions / Restrictions Precautions Precautions: Fall       Mobility Bed Mobility                  Transfers                      Balance                                   ADL Overall ADL's : Needs assistance/impaired Eating/Feeding: Minimal assistance;Bed level Eating/Feeding Details (indicate cue type and reason): assist to open containers and cut food Grooming: Wash/dry hands;Wash/dry face;Oral care;Minimal assistance;Bed level                                 General ADL Comments: Focus on resistive push-pull patterns, joint approximation [name] followed by grasp/release transferring object from hand to hand and towel exercises with L UE supported on table. Instructed pt in visual compensation/monitioring L UE. Pt reporting that L UE "feels like it belongs to someone else."        Vision                 Additional Comments: Pt able to locate 10/10 objects in L hemispace.   Perception     Praxis      Cognition   Behavior During Therapy: WFL for tasks assessed/performed Overall Cognitive Status: Impaired/Different from baseline Area of Impairment: Safety/judgement;Following commands        Following Commands: Follows one step  commands with increased time Safety/Judgement: Decreased awareness of safety;Decreased awareness of deficits     General Comments: improved awareness of L visual field    Extremity/Trunk Assessment               Exercises     Shoulder Instructions       General Comments      Pertinent Vitals/ Pain       Pain Assessment: No/denies pain  Home Living                                          Prior Functioning/Environment              Frequency Min 3X/week     Progress Toward Goals  OT Goals(current goals can now be found in the care plan section)  Progress towards OT goals: Progressing toward goals  Acute Rehab OT Goals Patient Stated Goal: Per wife to move well enough for her to A at home.    Plan Discharge plan remains appropriate    Co-evaluation  End of Session     Activity Tolerance Patient tolerated treatment well   Patient Left in bed;with call bell/phone within reach;with bed alarm set   Nurse Communication          Time: [id number] OT Time Calculation (min): 23 min  Charges: OT General Charges $OT Visit: 1 Procedure OT Treatments $Self Care/Home Management : 8-22 mins $Neuromuscular Re-education: 8-22 mins  [name] 11/23/2015, 3:53 PM  [phone]

## 2015-11-23 NOTE — Progress Notes (Signed)
[facility] Admission Coordinator Signed Physical Medicine and Rehabilitation PMR Pre-admission 11/23/2015 4:48 PM  Related encounter: ED to Hosp-Admission (Current) from 11/18/2015 in [place] [facility]    Expand All Collapse All   PMR Admission Coordinator Pre-Admission Assessment  Patient: Taylor Obrien is an 49 y.o., male MRN: [id number] DOB: May 26, 1966 Height: 5\' 5"  (165.1 cm) Weight: 98.567 kg (217 lb 4.8 oz)  Insurance Information HMO: PPO: PCP: IPA: 80/20: OTHER:  PRIMARY: Medicaid [place] Access in effect as of 123456 Policy#: [id number]  Subscriber: self CM Name: Phone#: Fax#:  Pre-Cert#: Employer:  Benefits: Phone #: Name:  Eff. Date: Deduct: Out of Pocket Max: Life Max:  CIR: SNF:  Outpatient: Co-Pay:  Home Health: Co-Pay:  DME: Co-Pay:  Providers:  SECONDARY: Policy#: Subscriber:  CM Name: Phone#: Fax#:  Pre-Cert#: Employer:  Benefits: Phone #: Name:  Eff. Date: Deduct: Out of Pocket Max: Life Max:  CIR: SNF:  Outpatient: Co-Pay:  Home Health: Co-Pay:  DME: Co-Pay:   Medicaid Application Date: Case Manager:  Disability Application Date: Case Worker:   Emergency Facilities manager Information    Name Relation Home Work Mobile   [place] Spouse [phone]  [phone]   Lewayne Bunting [phone]       Current Medical History  Patient Admitting Diagnosis: Acute R-ACA infarct History of Present Illness: HPI: HASSEL TETTERTON is a 49 y.o. male with history of HTN, depression, chronic pain who was admitted via [facility] on 11/18/15 with acute  onset of left sided weakness. CTA showed M2 occlusion. He was treated with TPA and underwent cerebral angio showing occluded R-ACA proximal segment with partial distal leptomeningeal collateralization and R-MCA without filling defects. 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 UDS positive for THC. Neurology feels that patient with embolic stroke of unknown etiology and 30 day monitor recommended by cardiology as TEE showed moderate LVH with EF 55-60% but no thrombus, ASD or PFO. Patient has had malignant requiring IV labetalol and Neurology recommends permissive HTN but gradually normalize in 5-7 days. Swallow evaluation done and patient started on regular diet. Patient with resultant right sided weakness, left inattention, poor awareness of deficits, poor safety with strong pusher tendencies. Therapy ongoing and working on pre-gait activity a well as [name] stability. CIR mmended for further therapies. Total: 10 NIH    Past Medical History  Past Medical History  Diagnosis Date  . Essential hypertension, benign   . Hyperlipidemia   . Depression   . Chronic pain   . GERD (gastroesophageal reflux disease)     Family History  family history includes Diabetes in his brother, father, and mother; Hypertension in his brother, father, and mother.  Prior Rehab/Hospitalizations:  Has the patient had major surgery during 100 days prior to admission? No  Current Medications   Current facility-administered medications:  . acetaminophen (TYLENOL) tablet 650 mg, 650 mg, Oral, Q4H PRN, 650 mg at 11/20/15 1817 **OR** acetaminophen (TYLENOL) suppository 650 mg, 650 mg, Rectal, Q4H PRN, Greta Doom, MD . amLODipine (NORVASC) tablet 10 mg, 10 mg, Oral, Daily, Garvin Fila, MD, 10 mg at 11/23/15 0919 .  antiseptic oral rinse (CPC / CETYLPYRIDINIUM CHLORIDE 0.05%) solution 7 mL, 7 mL, Mouth Rinse, BID, Greta Doom, MD, 7 mL at 11/23/15 1000 . aspirin tablet 325 mg, 325 mg, Oral, Daily, Pramod S Sethi,  MD, 325 mg at 11/23/15 0919 . atorvastatin (LIPITOR) tablet 40 mg, 40 mg, Oral, q1800, Garvin Fila, MD . cloNIDine (CATAPRES) tablet 0.3 mg, 0.3 mg, Oral, QHS,MR X 1, Garvin Fila, MD, 0.3 mg at 11/22/15 2241 . folic acid (FOLVITE) tablet 1 mg, 1 mg, Oral, Daily, Greta Doom, MD, 1 mg at 11/23/15 0919 . hydrochlorothiazide (HYDRODIURIL) tablet 25 mg, 25 mg, Oral, Daily, Garvin Fila, MD, 25 mg at 11/23/15 0919 . HYDROmorphone (DILAUDID) injection 2 mg, 2 mg, Intravenous, Q4H PRN, Catha Gosselin, MD, 2 mg at 11/22/15 2041 . labetalol (NORMODYNE,TRANDATE) injection 10-20 mg, 10-20 mg, Intravenous, Q10 min PRN, Garvin Fila, MD, 20 mg at 11/23/15 0906 . multivitamin with minerals tablet 1 tablet, 1 tablet, Oral, Daily, Greta Doom, MD, 1 tablet at 11/23/15 0919 . nicardipine (CARDENE) 20mg  in 0.86% saline 211ml IV infusion (0.1 mg/ml), 3-15 mg/hr, Intravenous, Continuous, Greta Doom, MD, Stopped at 11/21/15 2259 . nicotine (NICODERM CQ - dosed in mg/24 hours) patch 14 mg, 14 mg, Transdermal, Daily, Garvin Fila, MD, 14 mg at 11/23/15 0920 . ondansetron (ZOFRAN) injection 4 mg, 4 mg, Intravenous, Q4H PRN, Greta Doom, MD, 4 mg at 11/18/15 1900 . pantoprazole (PROTONIX) EC tablet 40 mg, 40 mg, Oral, QHS, Eudelia Bunch, RPH . thiamine (VITAMIN B-1) tablet 100 mg, 100 mg, Oral, Daily, 100 mg at 11/23/15 0919 **OR** [DISCONTINUED] thiamine (B-1) injection 100 mg, 100 mg, Intravenous, Daily, Greta Doom, MD, 100 mg at 11/21/15 1100  Patients Current Diet: Diet regular Room service appropriate?: Yes; Fluid consistency:: Thin  Precautions / Restrictions Precautions Precautions: Fall Restrictions Weight Bearing  Restrictions: No   Has the patient had 2 or more falls or a fall with injury in the past year?No  Prior Activity Level Community (5-7x/wk): Pt. was working part time with [employer] 3-5 days per week. 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 None of the above no device used PTA  Prior Functional Level Prior Function Level of Independence: Independent  Self Care: Did the patient need help bathing, dressing, using the toilet or eating? Independent  Indoor Mobility: Did the patient need assistance with walking from room to room (with or without device)? Independent  Stairs: Did the patient need assistance with internal or external stairs (with or without device)? Independent  Functional Cognition: Did the patient need help planning regular tasks such as shopping or remembering to take medications? Independent  Current Functional Level Cognition  Arousal/Alertness: Awake/alert Overall Cognitive Status: Impaired/Different from baseline Current Attention Level: Selective Orientation Level: Oriented X4 Following Commands: Follows one step commands with increased time Safety/Judgement: Decreased awareness of safety, Decreased awareness of deficits General Comments: improved awareness of L visual field Attention: Sustained Sustained Attention: Impaired Sustained Attention Impairment: Verbal basic, Functional basic Memory: Impaired Memory Impairment: Storage deficit, Decreased recall of new information Awareness: Impaired Awareness Impairment: Emergent impairment, Anticipatory impairment Problem Solving: Impaired Problem Solving Impairment: Verbal basic, Functional basic Behaviors: Impulsive Safety/Judgment: Impaired   Extremity Assessment (includes  Sensation/Coordination)  Upper Extremity Assessment: [name] deficits/detail [name] Deficits / Details: gross grasp only, no other voluntary movement, L neglect [name] Sensation: decreased light touch, decreased proprioception [name] Coordination: decreased fine motor, decreased gross motor  Lower Extremity Assessment: Defer to PT evaluation [name] Deficits / Details: No  active movement noted. pt flaccid in LE. Sensation diminished, but does have some sense of pain and ? proprioception.  [name] Sensation: decreased light touch, decreased proprioception [name] Coordination: decreased fine motor, decreased gross motor    ADLs  Overall ADL's : Needs assistance/impaired Eating/Feeding: Minimal assistance, Bed level Eating/Feeding Details (indicate cue type and reason): assist to open containers and cut food Grooming: Wash/dry hands, Wash/dry face, Oral care, Minimal assistance, Bed level Upper Body Bathing: Maximal assistance, Bed level Lower Body Bathing: Total assistance, Bed level Upper Body Dressing : Maximal assistance, Sitting Lower Body Dressing: Total assistance, Bed level Toilet Transfer: +2 for physical assistance, Maximal assistance, Stand-pivot, BSC Toileting- Clothing Manipulation and Hygiene: +2 for physical assistance, Total assistance, Sit to/from stand General ADL Comments: Focus on resistive push-pull patterns, joint approximation [name] followed by grasp/release transferring object from hand to hand and towel exercises with L UE supported on table. Instructed pt in visual compensation/monitioring L UE. Pt reporting that L UE "feels like it belongs to someone else."     Mobility  Overal bed mobility: Needs Assistance, +2 for physical assistance Bed Mobility: Supine to Sit Supine to sit: Mod assist, +2 for physical assistance General bed mobility comments: cues for sequencing and use of R UE to minimize pushing.     Transfers  Overall transfer level: Needs assistance Equipment  used: 2 person hand held assist Transfers: Sit to/from Stand, Stand Pivot Transfers Sit to Stand: Mod assist, +2 physical assistance Stand pivot transfers: Max assist, +2 physical assistance General transfer comment: With cueing, pt with decreased pushing today and better able to participate in transfer towards R side. Continues to need L LE blocked and increased A to complete pivot.     Ambulation / Gait / Stairs / Office manager / Balance Dynamic Sitting Balance Sitting balance - Comments: pt strong pusher towards L side. Needs consistent cues and facilitation for weightshifting towards R and reaching R UE towards R side. Worked on propping on R elbow while sitting.  Balance Overall balance assessment: Needs assistance Sitting-balance support: Single extremity supported, Feet supported Sitting balance-Leahy Scale: Poor Sitting balance - Comments: pt strong pusher towards L side. Needs consistent cues and facilitation for weightshifting towards R and reaching R UE towards R side. Worked on propping on R elbow while sitting.  Postural control: Left lateral lean Standing balance support: No upper extremity supported, During functional activity Standing balance-Leahy Scale: Poor    Special needs/care consideration BiPAP/CPAP per wife, pt has sleep apnea but does not use his cpap CPM no Continuous Drip IV no Dialysis no  Life Vest no Oxygen no Special Bed no Trach Size no Wound Vac (area) no  Skin WDL per nursing assessment  Bowel mgmt: last BM 11/20/15  Bladder mgmt: urinary catheter Diabetic mgmt     Previous Home Environment Living Arrangements: Spouse/significant other, Children (4 kids) Lives With: (4 children) Available Help at Discharge: Family, Available 24 hours/day Type of Home: Apartment Home Layout: One level Home Access: Stairs to enter Entrance Stairs-Rails: Right,  Left, Can reach both Entrance Stairs-Number of Steps: 4 Bathroom Shower/Tub: Chiropodist: Standard Home Care Services: No 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  Discharge Living Setting Plans for Discharge Living Setting: [name] home Type of Home at Discharge: Apartment Discharge Home Layout: One level Discharge Home Access: Stairs to enter Entrance Stairs-Rails: Right, Left Entrance Stairs-Number of Steps: 4-5 (steps lead down to apartment) Discharge Bathroom Shower/Tub:  Tub/shower unit Discharge Bathroom Toilet: Standard Discharge Bathroom Accessibility: Yes How Accessible: Accessible via walker Does the patient have any problems obtaining your medications?: No  Social/Family/Support Systems Patient Roles: Spouse, Parent Anticipated Caregiver: wife, Kalai Royes Anticipated Ambulance person Information: [phone] (cell) Ability/Limitations of Caregiver: no limitations Caregiver Availability: 24/7 Discharge Plan Discussed with Primary Caregiver: Yes Is Caregiver In Agreement with Plan?: Yes Does Caregiver/Family have Issues with Lodging/Transportation while Pt is in Rehab?: No   Goals/Additional Needs Patient/Family Goal for Rehab: min and mod PT; min OT , n/a SLP Expected length of stay: 20-24 days Cultural Considerations: none Dietary Needs: regular diet, thin liquids Equipment Needs: TBA Pt/Family Agrees to Admission and willing to participate: Yes Program Orientation Provided & Reviewed with Pt/Caregiver Including Roles & Responsibilities: Yes   Decrease burden of Care through IP rehab admission: no   Possible need for SNF placement upon discharge: Not anticipated   Patient Condition: This patient's medical and functional status has changed since the consult dated: 11/20/15 in which the Rehabilitation Physician determined and documented that the patient's condition is appropriate for intensive rehabilitative care in  an inpatient rehabilitation facility. See "History of Present Illness" (above) for medical update. Functional changes are: +2 max assist for transfers, improved tracking during therapy session. [name] medical and functional status update has been discussed with the Rehabilitation physician and patient remains appropriate for inpatient rehabilitation. Will admit to inpatient rehab today.  Preadmission Screen Completed By: Gerlean Ren, 11/23/2015 5:05 PM ______________________________________________________________________  Discussed status with Dr. Letta Pate on 11/23/15 at 1705 and received telephone approval for admission today.  Admission Coordinator: Gerlean Ren, time [id number] /Date 11/23/15          Cosigned by: Charlett Blake, MD at 11/23/2015 5:30 PM  Revision History

## 2015-11-23 NOTE — Progress Notes (Signed)
Patient was admitted onto inpatient rehab to room 4 M 05. Patient and wife at bedside given admission packet, and all questions answered. Patient oriented to call bell, and discussed safety plan and safety agreement- patient and patient's wife verbalized understanding. On admission patient vital signs collected. Manual blood pressure noted to be 190/118, patient given PRN Clonidine 0.1mg . Report given to on-coming nurse-will continue to monitor patient.

## 2015-11-24 ENCOUNTER — Ambulatory Visit (HOSPITAL_COMMUNITY): Payer: Medicaid Other

## 2015-11-24 ENCOUNTER — Inpatient Hospital Stay (HOSPITAL_COMMUNITY): Payer: Medicaid Other | Admitting: Physical Therapy

## 2015-11-24 ENCOUNTER — Inpatient Hospital Stay (HOSPITAL_COMMUNITY): Payer: Medicaid Other

## 2015-11-24 DIAGNOSIS — I10 Essential (primary) hypertension: Secondary | ICD-10-CM

## 2015-11-24 DIAGNOSIS — I69398 Other sequelae of cerebral infarction: Secondary | ICD-10-CM

## 2015-11-24 DIAGNOSIS — I69954 Hemiplegia and hemiparesis following unspecified cerebrovascular disease affecting left non-dominant side: Secondary | ICD-10-CM

## 2015-11-24 DIAGNOSIS — R609 Edema, unspecified: Secondary | ICD-10-CM

## 2015-11-24 DIAGNOSIS — I63521 Cerebral infarction due to unspecified occlusion or stenosis of right anterior cerebral artery: Secondary | ICD-10-CM

## 2015-11-24 DIAGNOSIS — R269 Unspecified abnormalities of gait and mobility: Secondary | ICD-10-CM

## 2015-11-24 LAB — COMPREHENSIVE METABOLIC PANEL
ALK PHOS: 48 U/L (ref 38–126)
ALT: 55 U/L (ref 17–63)
AST: 51 U/L — AB (ref 15–41)
Albumin: 3.1 g/dL — ABNORMAL LOW (ref 3.5–5.0)
Anion gap: 11 (ref 5–15)
BUN: 16 mg/dL (ref 6–20)
CALCIUM: 9.8 mg/dL (ref 8.9–10.3)
CHLORIDE: 96 mmol/L — AB (ref 101–111)
CO2: 28 mmol/L (ref 22–32)
CREATININE: 1.48 mg/dL — AB (ref 0.61–1.24)
GFR calc Af Amer: >60 mL/min (ref >60)
GFR, EST NON AFRICAN AMERICAN: 55 mL/min — AB (ref >60)
Glucose, Bld: 126 mg/dL — ABNORMAL HIGH (ref 65–99)
Potassium: 3.5 mmol/L (ref 3.5–5.1)
Sodium: 135 mmol/L (ref 135–145)
Total Bilirubin: 0.9 mg/dL (ref 0.3–1.2)
Total Protein: 7 g/dL (ref 6.5–8.1)

## 2015-11-24 MED ORDER — HYDROCHLOROTHIAZIDE 12.5 MG PO CAPS
12.5000 mg | ORAL_CAPSULE | Freq: Every day | ORAL | Status: DC
  Administered 2015-11-24: 12.5 mg via ORAL
  Filled 2015-11-24 (×2): qty 1

## 2015-11-24 NOTE — Progress Notes (Signed)
VASCULAR LAB PRELIMINARY  PRELIMINARY  PRELIMINARY  PRELIMINARY  Bilateral lower extremity venous duplex  completed.    Preliminary report:  Bilateral:  No evidence of DVT, superficial thrombosis, or Baker's Cyst.    Lowery Paullin, RVT 11/24/2015, 2:36 PM

## 2015-11-24 NOTE — Progress Notes (Signed)
49 y.o. male with history of HTN, depression, chronic pain who was admitted via [facility] on 11/18/15 with acute onset of left sided weakness. CTA showed M2 occlusion. He was treated with TPA and underwent cerebral angio showing occluded R-ACA proximal segment with partial distal leptomeningeal collateralization and R-MCA without filling defects. 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 UDS positive for THC. Neurology feels that patient with embolic stroke of unknown etiology and 30 day monitor recommended by cardiology as TEE showed moderate LVH with EF 55-60% but no thrombus  Subjective/Complaints:   Objective: Vital Signs: Blood pressure 153/100, pulse 74, temperature 98.9 F (37.2 C), temperature source Oral, resp. rate 20, SpO2 100 %. No results found. Results for orders placed or performed during the hospital encounter of 11/23/15 (from the past 72 hour(s))  Comprehensive metabolic panel     Status: Abnormal   Collection Time: 11/24/15  5:03 AM  Result Value Ref Range   Sodium 135 135 - 145 mmol/L   Potassium 3.5 3.5 - 5.1 mmol/L   Chloride 96 (L) 101 - 111 mmol/L   CO2 28 22 - 32 mmol/L   Glucose, Bld 126 (H) 65 - 99 mg/dL   BUN 16 6 - 20 mg/dL   Creatinine, Ser 1.48 (H) 0.61 - 1.24 mg/dL   Calcium 9.8 8.9 - 10.3 mg/dL   Total Protein 7.0 6.5 - 8.1 g/dL   Albumin 3.1 (L) 3.5 - 5.0 g/dL   AST 51 (H) 15 - 41 U/L   ALT 55 17 - 63 U/L   Alkaline Phosphatase 48 38 - 126 U/L   Total Bilirubin 0.9 0.3 - 1.2 mg/dL   GFR calc non Af Amer 55 (L) >60 mL/min   GFR calc Af Amer >60 >60 mL/min    Comment: (NOTE) The eGFR has been calculated using the CKD EPI equation. This calculation has not been validated in all clinical situations. eGFR's persistently <60 mL/min signify possible Chronic Kidney Disease.    Anion gap 11 5 -  15     HEENT: normal Cardio: RRR and no murmur Resp: CTA B/L and unlabored GI: BS positive and NT, ND Extremity:  Pulses positive and No Edema Skin:   Intact Neuro: Alert/Oriented, Abnormal Sensory absent in [name] and [name], Abnormal Motor 2- Left biceps and triceps, 3- grip , 0/5 [name]    and Abnormal FMC Ataxic/ dec FMC Musc/Skel:  Other no pain with UE and LE ROM Gen NAD   Assessment/Plan: 1. Functional deficits secondary to Right ACA infarct with Left hemiparesis  which require 3+ hours per day of interdisciplinary therapy in a comprehensive inpatient rehab setting. Physiatrist is providing close team supervision and 24 hour management of active medical problems listed below. Physiatrist and rehab team continue to assess barriers to discharge/monitor patient progress toward functional and medical goals. FIM:                   Function - Comprehension Comprehension: Auditory Comprehension assist level: Understands basic 50 - 74% of the time/ requires cueing 25 - 49% of the time  Function - Expression Expression: Verbal Expression assist level: Expresses basic 50 - 74% of the time/requires cueing 25 - 49% of the time. Needs to repeat parts of sentences.  Function - Social Interaction Social Interaction assist level: Interacts appropriately 50 - 74% of the time - May be  physically or verbally inappropriate.  Function - Problem Solving Problem solving assist level: Solves basic 50 - 74% of the time/requires cueing 25 - 49% of the time  Function - Memory Memory assist level: Recognizes or recalls 50 - 74% of the time/requires cueing 25 - 49% of the time Patient normally able to recall (first 3 days only): Current season, That he or she is in a hospital  Medical Problem List and Plan: 1.  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  DVT Prophylaxis/Anticoagulation: Mechanical: Sequential compression devices, below knee Bilateral lower extremities, Screening doppler  today 3. Pain Management: Denies any HA today. Will order hydrocodone prn for pain 4. Mood: LCSW to follow for evaluation and support.  Has supportive wife   5. Neuropsych: This patient is not fully capable of making decisions on his own behalf. 6. Skin/Wound Care: Routine pressure relief measures. Maintain adequate nutrition and hydration status.   7. Fluids/Electrolytes/Nutrition: Monitor I/O. Appetite remains good. Check lytes in am. 8.  HTN: restart HCTZ and cont 76m  Norvasc daily. Is also getting catapres 0.3 mg with repeat doses at nights.   Monitor every 6 hours as still labile requiring IV labetalol. Will add catapres prn additionally for SBP > 180 or DBP >105 as per Neurology parameters. 9. Dyslipidemia: On Lipitor.   10. H/o depression with anxiety: Used to see mental health in the past. Has been off meds for years.   LOS (Days) 1 A FACE TO FACE EVALUATION WAS PERFORMED  Taylor Obrien E 11/24/2015, 7:17 AM

## 2015-11-24 NOTE — Progress Notes (Signed)
Social Work  Social Work Assessment and Plan  Patient Details  Name: Taylor Obrien MRN: [id number] Date of Birth: 12-Oct-1966  Today's Date: 11/24/2015  Problem List:  Patient Active Problem List   Diagnosis Date Noted  . 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 Gait disturbance, post-stroke 11/23/2015  . Stroke due to occlusion of right anterior cerebral artery ([place]) 11/23/2015  . Cerebrovascular accident (CVA) due to occlusion of right anterior cerebral artery ([place])   . Essential hypertension   . Depression   . Chronic pain syndrome   . ETOH abuse   . Marijuana abuse   . Cerebrovascular accident (CVA) due to thrombosis of right carotid artery ([place])   . Malignant hypertension   . Hyperlipidemia   . CVA (cerebral infarction) 11/18/2015  . Stroke (cerebrum) ([place]) 11/18/2015  . Chest pain 10/09/2012  . GERD (gastroesophageal reflux disease) 10/09/2012  . Chronic back pain 10/09/2012  . Tobacco abuse 10/09/2012  . Hypertensive heart disease 08/31/2012  . Accelerated hypertension 08/31/2012  . Precordial pain 08/31/2012   Past Medical History:  Past Medical History  Diagnosis Date  . Essential hypertension, benign   . Hyperlipidemia   . Depression   . Chronic pain   . GERD (gastroesophageal reflux disease)    Past Surgical History:  Past Surgical History  Procedure Laterality Date  . Right rotator cuff repair    . Closed reduction mandibular fracture w/ arch bars      + multiple extractions  . Removal foreign body right shoulder    . Condyloma resection    . Radiology with anesthesia N/A 11/18/2015    Procedure: RADIOLOGY WITH ANESTHESIA;  Surgeon: Luanne Bras, MD;  Location: [facility];  Service: Radiology;  Laterality: N/A;  . Tee without cardioversion N/A 11/21/2015    Procedure: TRANSESOPHAGEAL ECHOCARDIOGRAM (TEE);  Surgeon: Larey Dresser, MD;  Location: [facility] ENDOSCOPY;  Service:  Cardiovascular;  Laterality: N/A;   Social History:  reports that he has been smoking Cigarettes.  He has a 15 pack-year smoking history. He has never used smokeless tobacco. He reports that he drinks alcohol. He reports that he uses illicit drugs (Marijuana).  Family / Support Systems Marital Status: Married Patient Roles: Spouse, Parent, Other (Comment) (employee) Spouse/Significant Other: Rosario Adie [phone] Children: Four children range in age 73 yo-16 yo Other Supports: John-brother [phone] Anticipated Caregiver: Wife Ability/Limitations of Caregiver: recently laid off Caregiver Availability: 24/7 Family Dynamics: Close family who look out for one another. Wife is here and propviding emotional support. Pt is somewhat down regarding his deficits from his stroke. He does not want to burden his wife and would like to be home for Christmas but may not due to just next week.  Social History Preferred language: English Religion:  Cultural Background: No issues Education: High School Read: Yes Write: Yes Employment Status: Employed Name of Employer: [employer] part time Return to Work Plans: Would like to return but unsure if will be able too Freight forwarder Issues: No issues Guardian/Conservator: None-according to MD pt is not fully capable of making his own decisions while here, so will look toward his wife since she is next o kin.   Abuse/Neglect Physical Abuse: Denies Verbal Abuse: Denies Sexual Abuse: Denies Exploitation of patient/patient's resources: Denies Self-Neglect: Denies  Emotional Status Pt's affect, behavior adn adjustment status: Pt is trying to be positive and states: " My rehab is just beginning."  He wants to be  independent and recover from this. Wife is here and supportive, she worries about him and his history of depression and feels it may affect him now. He is willing to do what he can to improve while  here. Recent Psychosocial Issues: other health issues-chronic back pain.  Pyschiatric History: History of depression was on medication but not now, may need to re-start due to stroke and high risk of depression. Neuro-psych to see on Monday for coping and assess for depression. Will ask MD regarding starting medication Substance Abuse History: Tobacco and positive for THC he feels not a big issue, can quit at any time.  Will discuss more throughout his stay  Patient / Family Perceptions, Expectations & Goals Pt/Family understanding of illness & functional limitations: Pt and wife can explain his stroke and deficits. Both have spoken with the MD and are trying to be optimistic abut his recovery. Wife feels her questions and concerns are being addressed. Premorbid pt/family roles/activities: Husband, father, Brother, employee, friend, etc Anticipated changes in roles/activities/participation: resume Pt/family expectations/goals: Pt states: " It's is early I want to do well here."  Wife states: " I hope he does well and can do for himself, but I will help."  [employer]: None Premorbid Home Care/DME Agencies: None Transportation available at discharge: Wife Resource referrals recommended: Neuropsychology, Support group (specify)  Discharge Planning Living Arrangements: Spouse/significant other, Children Support Systems: Spouse/significant other, Children, Other relatives, Friends/neighbors Type of Residence: Private residence Insurance Resources: Medicaid (specify county) [employer]) Pensions consultant: Employment, Secondary school teacher Screen Referred: Yes Living Expenses: Education officer, community Management: Patient, Spouse Does the patient have any problems obtaining your medications?: No (was not seeng a MD prior to admission) Home Management: Wife and pt helped one another Patient/Family Preliminary Plans: Return home wiht family, wife recenlty laid off and can  assist him. Their four children are in school during the day. Wife plans to be here as much as poossible to see him in therapies, kids going to be out for Christmas break next two weeks. Social Work Anticipated Follow Up Needs: HH/OP, Support Group  Clinical Impression Pleasant gentleman who is willing to put forth the effort in order to make progress here. Supportive wife who is willing to assist and be here as much as possible, they have four school age children at home. Have encouraged wife to apply for SSD, they already have Medicaid. Neuro-psych to see pt Monday due to his history of depression concern is he will become depressed due to deficits from his stroke and not being able to be home for Christmas. Will work on getting a PCP for pt prior to discharge and work on discharge plan.  Elease Hashimoto 11/24/2015, 2:37 PM

## 2015-11-24 NOTE — Evaluation (Signed)
Occupational Therapy Assessment and Plan  Patient Details  Name: STIVEN KASPAR MRN: [id number] Date of Birth: Mar 18, 1966  OT Diagnosis: abnormal posture, apraxia, cognitive deficits and hemiplegia affecting non-dominant side Rehab Potential: Rehab Potential (ACUTE ONLY): Good ELOS: 3-4 weeks   Today's Date: 11/24/2015 OT Individual Time: 1030-1200 OT Individual Time Calculation (min): 90 min     Problem List:  Patient Active Problem List   Diagnosis Date Noted  . 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 Gait disturbance, post-stroke 11/23/2015  . Stroke due to occlusion of right anterior cerebral artery ([place]) 11/23/2015  . Cerebrovascular accident (CVA) due to occlusion of right anterior cerebral artery ([place])   . Essential hypertension   . Depression   . Chronic pain syndrome   . ETOH abuse   . Marijuana abuse   . Cerebrovascular accident (CVA) due to thrombosis of right carotid artery ([place])   . Malignant hypertension   . Hyperlipidemia   . CVA (cerebral infarction) 11/18/2015  . Stroke (cerebrum) ([place]) 11/18/2015  . Chest pain 10/09/2012  . GERD (gastroesophageal reflux disease) 10/09/2012  . Chronic back pain 10/09/2012  . Tobacco abuse 10/09/2012  . Hypertensive heart disease 08/31/2012  . Accelerated hypertension 08/31/2012  . Precordial pain 08/31/2012    Past Medical History:  Past Medical History  Diagnosis Date  . Essential hypertension, benign   . Hyperlipidemia   . Depression   . Chronic pain   . GERD (gastroesophageal reflux disease)    Past Surgical History:  Past Surgical History  Procedure Laterality Date  . Right rotator cuff repair    . Closed reduction mandibular fracture w/ arch bars      + multiple extractions  . Removal foreign body right shoulder    . Condyloma resection    . Radiology with anesthesia N/A 11/18/2015    Procedure: RADIOLOGY WITH ANESTHESIA;   Surgeon: Luanne Bras, MD;  Location: [facility];  Service: Radiology;  Laterality: N/A;  . Tee without cardioversion N/A 11/21/2015    Procedure: TRANSESOPHAGEAL ECHOCARDIOGRAM (TEE);  Surgeon: Larey Dresser, MD;  Location: [facility] ENDOSCOPY;  Service: Cardiovascular;  Laterality: N/A;    Assessment & Plan Clinical Impression: Patient is a 49 y.o. male with history of HTN, depression, chronic pain who was admitted via [facility] on 11/18/15 with acute onset of left sided weakness. CTA showed M2 occlusion. He was treated with TPA and underwent cerebral angio showing occluded R-ACA proximal segment with partial distal leptomeningeal collateralization and R-MCA without filling defects. 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 UDS positive for THC. Neurology feels that patient with embolic stroke of unknown etiology and 30 day monitor recommended by cardiology as TEE showed moderate LVH with EF 55-60% but no thrombus, ASD or PFO. Patient has had malignant requiring IV labetalol and Neurology recommends permissive HTN but gradually normalize in 5-7 days. Swallow evaluation done and patient started on regular diet. Patient with resultant right sided weakness, left inattention, poor awareness of deficits, poor safety with strong pusher tendencies. Therapy ongoing and working on pre-gait activity a well as [name] stability.    Patient transferred to CIR on 11/23/2015.    Patient currently requires overall max assist with basic self-care skills secondary to impaired timing and sequencing, unbalanced muscle activation, motor apraxia and decreased motor planning.  Prior to hospitalization,  patient could complete BADL/iADL independently.  Patient will benefit from skilled intervention to decrease level of assist with basic self-care skills prior to  discharge home with care partner.  Anticipate patient will require 24 hour supervision and follow up home health.  OT - End of Session Activity Tolerance: Tolerates 30+ min activity with multiple rests Endurance Deficit: Yes OT Assessment Rehab Potential (ACUTE ONLY): Good OT Patient demonstrates impairments in the following area(s): Balance;Cognition;Endurance;Safety;Perception;Motor OT Basic ADL's Functional Problem(s): Eating;Grooming;Bathing;Dressing;Toileting OT Advanced ADL's Functional Problem(s): Laundry OT Transfers Functional Problem(s): Toilet;Tub/Shower OT Additional Impairment(s): Fuctional Use of Upper Extremity OT Plan OT Intensity: Minimum of 1-2 x/day, 45 to 90 minutes OT Frequency: 5 out of 7 days OT Duration/Estimated Length of Stay: 2-3 weeks OT Treatment/Interventions: Balance/vestibular training;Discharge planning;DME/adaptive equipment instruction;Patient/family education;Neuromuscular re-education;Functional mobility training;Self Care/advanced ADL retraining;Therapeutic Activities;Therapeutic Exercise;UE/LE Coordination activities;Cognitive remediation/compensation OT Self Feeding Anticipated Outcome(s): Supervision OT Basic Self-Care Anticipated Outcome(s): Miin Assist OT Toileting Anticipated Outcome(s): Min Assist OT Bathroom Transfers Anticipated Outcome(s): Min Assist OT Recommendation Patient destination: Home Follow Up Recommendations: 24 hour supervision/assistance Equipment Recommended: To be determined  Skilled Therapeutic Intervention OT 1:1 evaluation completed with treatment provided to address transfers, sitting balance, postural control, attention, and family education on treatment goals and methods.   Pt presents with left inattention, pusher syndrome, and motor planning deficits requiring +2 assist to complete transfers.   Pt able to wash upper body with overall mod assist but required max-total assist to bathe legs seated at sink.     OT  Evaluation Precautions/Restrictions  Precautions Precautions: Fall Restrictions Weight Bearing Restrictions: No  General Chart Reviewed: Yes Family/Caregiver Present: Yes ([place])  Vital Signs Therapy Vitals BP: (!) 161/104 mmHg  Pain Pain Assessment Pain Assessment: No/denies pain  Home Living/Prior Functioning Home Living Available Help at Discharge: Family Type of Home: Apartment Home Access: Stairs to enter Technical brewer of Steps: 4 Entrance Stairs-Rails: Right, Left, Can reach both Home Layout: One level Bathroom Shower/Tub: Optometrist: Yes Additional Comments: works for the [employer], part-time 4 hrs/wk  Lives With: Spouse (4 children: 2 boys and 2 girls  44-16 y/o) IADL History Homemaking Responsibilities: Yes Meal Prep Responsibility: Secondary Laundry Responsibility: Secondary Cleaning Responsibility: No Bill Paying/Finance Responsibility: Primary Shopping Responsibility: Primary Child Care Responsibility: Secondary Current License: Yes Mode of Transportation:  (1991 Mazda PU, standard trans) Education: GED Occupation: Part time employment Type of Occupation: Biomedical scientist, general clean-up, some painting Leisure and Hobbies: Likes to "make tracks" (mixing music, electronically), played drums, violin, alto sax Prior Function Level of Independence: Independent with basic ADLs  Able to Take Stairs?: Yes Driving: Yes Vocation: Part time employment  ADL ADL ADL Comments: see Functional Tool  Vision/Perception  Vision- History Baseline Vision/History: No visual deficits Wears Glasses: Reading only Patient Visual Report: No change from baseline Vision- Assessment Vision Assessment?: Yes Eye Alignment: Within Functional Limits Ocular Range of Motion: Within Functional Limits   Cognition Memory: Impaired Memory Impairment: Storage deficit;Decreased recall of new  information Attention: Sustained Sustained Attention: Appears intact Awareness: Impaired Awareness Impairment: Emergent impairment;Anticipatory impairment Safety/Judgment: Impaired Comments: very impulsive  Sensation Sensation Light Touch: Impaired Detail Light Touch Impaired Details: Absent [name];Impaired [name] Stereognosis: Impaired by gross assessment Stereognosis Impaired Details: Impaired [name] Hot/Cold: Appears Intact Proprioception Impaired Details: Absent [name];Impaired [name] Coordination Gross Motor Movements are Fluid and Coordinated: No Fine Motor Movements are Fluid and Coordinated: No Coordination and Movement Description: Alien hand (left hand)  Motor  Motor Motor: Hemiplegia;Abnormal tone;Abnormal  postural alignment and control  Trunk/Postural Assessment  Cervical Assessment Cervical Assessment: Within Functional Limits Thoracic Assessment Thoracic Assessment: Exceptions to [facility] (left lateral lean) Lumbar Assessment Lumbar Assessment: Exceptions to [facility] (Posterior tilt) Postural Control Postural Control: Deficits on evaluation Trunk Control: impaired and L lateral lean   Balance Balance Balance Assessed: Yes Static Sitting Balance Static Sitting - Level of Assistance: 1: +2 Total assist;3: Mod assist Dynamic Sitting Balance Dynamic Sitting - Level of Assistance: 1: +1 Total assist Sitting balance - Comments: pt strong pusher towards L side.  Needs consistent cues and facilitation for weightshifting towards R and reaching R UE towards R side.  Half lap tray at left UE for w/c. Static Standing Balance Static Standing - Level of Assistance: 1: +2 Total assist Dynamic Standing Balance Dynamic Standing - Level of Assistance: 1: +2 Total assist  Extremity/Trunk Assessment RUE Assessment RUE Assessment: Within Functional Limits [name] Assessment [name] Assessment: left hemiparesis (strong grip/forearm/biceps 4+/5; 0/5 triceps, 1+/5 shoulder flexion),    See Function  Navigator for Current Functional Status.   Refer to Care Plan for Long Term Goals  Recommendations for other services: Speech (cognitive assess and treat)  Discharge Criteria: Patient will be discharged from OT if patient refuses treatment 3 consecutive times without medical reason, if treatment goals not met, if there is a change in medical status, if patient makes no progress towards goals or if patient is discharged from hospital.  The above assessment, treatment plan, treatment alternatives and goals were discussed and mutually agreed upon: by patient and by family  [facility] 11/24/2015, 3:17 PM

## 2015-11-24 NOTE — Progress Notes (Signed)
Physical Therapy Session Note  Patient Details  Name: Taylor Obrien MRN: [id number] Date of Birth: Oct 20, 1966  Today's Date: 11/24/2015 PT Individual Time: 0930-1005 PT Individual Time Calculation (min): 35 min   Short Term Goals: Week 1:  PT Short Term Goal 1 (Week 1): Pt will be able to demonstrate static sitting balance at midline with mod assist x 5 min PT Short Term Goal 2 (Week 1): Pt will be able to perform basic transfers on level surfaces with max assist of 1 PT Short Term Goal 3 (Week 1): Pt will be able to attend to [name] and [name] during transfers 50% of the time PT Short Term Goal 4 (Week 1): Pt will be able to propel w/c with hemitechnique x 50' with min assist  Skilled Therapeutic Interventions/Progress Updates:    Pt received up in w/c with wife present entire session. Neuromuscular Reeducation - PT instructs pt in L LE NMR activity: seated hip adduction with PT providing manual resistance to R (strong) LE in order to facilitation neurologic overflow and 2-/5 L hip adduction seen with this technique: x 10 reps. W/C Management - PT instructs pt in hemi-technique of self propelling manual w/c with R UE/LE req mod - max A for steering due to poor coordination of R UE/LE. R UE demonstrate strong propulsion, but R LE poor use of foot to steer (shoes on). PT took pt's BP after w/c propulsion and found it to be 165/109 in sit. PT notified RN as diastolic BP was above parameters (105) and she agrees to continue monitoring pt. Pt ended up in w/c with quick release belt in place and wife present. Continue per PT POC.   Therapy Documentation Precautions:  Precautions Precautions: Fall Precaution Comments: [name] Restrictions Weight Bearing Restrictions: No Pain: Pain Assessment Pain Assessment: No/denies pain   See Function Navigator for Current Functional Status.   Therapy/Group: Individual Therapy  Yakima Kreitzer M 11/24/2015, 12:38 PM

## 2015-11-24 NOTE — Interval H&P Note (Signed)
Taylor Obrien was admitted today to Inpatient Rehabilitation with the diagnosis of Left Hemiparesis due to R ACA infarct.  The patient's history has been reviewed, patient examined, and there is no change in status.  Patient continues to be appropriate for intensive inpatient rehabilitation.  I have reviewed the patient's chart and labs.  Questions were answered to the patient's satisfaction. The PAPE has been reviewed and assessment remains appropriate.  Alysia Penna E 11/24/2015, 7:14 AM

## 2015-11-24 NOTE — Care Management Note (Signed)
[facility] Individual Statement of Services  Patient Name:  Taylor Obrien  Date:  11/24/2015  Welcome to the [facility].  Our goal is to provide you with an individualized program based on your diagnosis and situation, designed to meet your specific needs.  With this comprehensive rehabilitation program, you will be expected to participate in at least 3 hours of rehabilitation therapies Monday-Friday, with modified therapy programming on the weekends.  Your rehabilitation program will include the following services:  Physical Therapy (PT), Occupational Therapy (OT), Speech Therapy (ST), 24 hour per day rehabilitation nursing, Therapeutic Recreaction (TR), Neuropsychology, Case Management (Social Worker), Rehabilitation Medicine, Nutrition Services and Pharmacy Services  Weekly team conferences will be held on Wednesday to discuss your progress.  Your Social Worker will talk with you frequently to get your input and to update you on team discussions.  Team conferences with you and your family in attendance may also be held.  Expected length of stay: 21-28 days  Overall anticipated outcome: min/mod level  Depending on your progress and recovery, your program may change. Your Social Worker will coordinate services and will keep you informed of any changes. Your Social Worker's name and contact numbers are listed  below.  The following services may also be recommended but are not provided by the [facility] will be made to provide these services after discharge if needed.  Arrangements include referral to agencies that provide these services.  Your insurance has been verified to be:  Medicaid Your primary doctor is:  None  Pertinent information will be shared with your doctor and your  insurance company.  Social Worker:  Taylor Obrien, Taylor Obrien or ([phone]  Information discussed with and copy given to patient by: Elease Hashimoto, 11/24/2015, 10:38 AM

## 2015-11-24 NOTE — Progress Notes (Signed)
Physical Therapy Session Note  Patient Details  Name: Taylor Obrien MRN: [id number] Date of Birth: 10-14-1966  Today's Date: 11/24/2015 PT Individual Time: 1500-1530 PT Individual Time Calculation (min): 30 min   Short Term Goals: Week 1:  PT Short Term Goal 1 (Week 1): Pt will be able to demonstrate static sitting balance at midline with mod assist x 5 min PT Short Term Goal 2 (Week 1): Pt will be able to perform basic transfers on level surfaces with max assist of 1 PT Short Term Goal 3 (Week 1): Pt will be able to attend to [name] and [name] during transfers 50% of the time PT Short Term Goal 4 (Week 1): Pt will be able to propel w/c with hemitechnique x 50' with min assist  Skilled Therapeutic Interventions/Progress Updates:    Session focused on education with pt and pt's wife on positioning and management of [name] in the bed, risk of subluxation if pulling on shoulder or not protected, ROM and stretching to [name] with return demonstration by wife, and edema control techniques for [name]/[name] as appropriate. Both receptive to learning.   Therapy Documentation Precautions:  Precautions Precautions: Fall Precaution Comments: [name] Restrictions Weight Bearing Restrictions: No  Pain:  Denies pain.    See Function Navigator for Current Functional Status.   Therapy/Group: Individual Therapy  [name] [name], PT, DPT  11/24/2015, 3:46 PM

## 2015-11-24 NOTE — IPOC Note (Signed)
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 (HCC) Active Problems:   Gait disturbance, post-stroke   Stroke due to occlusion of right anterior cerebral artery [facility])     Functional Problem List: Nursing Bladder, Bowel, Medication Management, Pain, Safety, Behavior  PT Balance, Edema, Endurance, Motor, Pain, Perception, Safety, Sensory, Skin Integrity  OT Balance, Cognition, Endurance, Safety, Perception, Motor  SLP    TR         Basic ADL's: OT Eating, Grooming, Bathing, Dressing, Toileting     Advanced  ADL's: OT Laundry     Transfers: PT Bed Mobility, Bed to Chair, Musician, Manufacturing systems engineer, Metallurgist: PT Ambulation, Emergency planning/management officer, Stairs     Additional Impairments: OT Fuctional Use of Upper Extremity  SLP        TR      Anticipated Outcomes Item Anticipated Outcome  Self Feeding Supervision  Swallowing      Basic self-care  Miin Assist  Toileting  Min Assist   Bathroom Transfers Min Assist  Bowel/Bladder  Continent of bowel and bladder with timed toileting.   Transfers  min assist  Locomotion  supervision w/c mobility; mod assist gait short distances  Communication     Cognition     Pain  Pain less than 3  Safety/Judgment  Supervision   Therapy Plan: PT Intensity: Minimum of 1-2 x/day ,45 to 90 minutes PT Frequency: 5 out of 7 days PT Duration Estimated Length of Stay: 21-28 days OT Intensity: Minimum of 1-2 x/day, 45 to 90 minutes OT Frequency: 5 out of 7 days OT Duration/Estimated Length of Stay: 3-4 weeks         Team Interventions: Nursing Interventions Patient/Family Education, Bowel Management, Bladder Management, Disease Management/Prevention, Medication Management, Pain Management   PT interventions Ambulation/gait training, Balance/vestibular training, Cognitive remediation/compensation, Community reintegration, Discharge planning, Disease management/prevention, DME/adaptive equipment instruction, Functional mobility training, Functional electrical stimulation, Neuromuscular re-education, Pain management, Patient/family education, Psychosocial support, Skin care/wound management, Splinting/orthotics, Stair training, Therapeutic Activities, Therapeutic Exercise, UE/LE Strength taining/ROM, UE/LE Coordination activities, Visual/perceptual remediation/compensation, Wheelchair propulsion/positioning  OT Interventions Training and development officer, Discharge planning, DME/adaptive equipment instruction, Patient/family education, Neuromuscular re-education, Functional mobility training, Self Care/advanced ADL retraining, Therapeutic Activities, Therapeutic Exercise, UE/LE Coordination activities, Cognitive remediation/compensation  SLP Interventions    TR Interventions    SW/CM Interventions Discharge Planning, Psychosocial Support, Patient/Family Education    Team Discharge Planning: Destination: PT-Home ,OT- Home , SLP-  Projected Follow-up: PT-Home health PT, 24 hour supervision/assistance, OT-  24 hour supervision/assistance, SLP-  Projected Equipment Needs: PT-Wheelchair (measurements), Wheelchair cushion (measurements), Other (comment) (assistive device TBD), OT- To be determined, SLP-  Equipment Details: PT- , OT-  Patient/family involved in discharge planning: PT- Patient, Family member/caregiver,  OT-Patient, Family member/caregiver, SLP-   MD ELOS: 20-22d Medical Rehab Prognosis:  Good Assessment: 49 y.o. male with history of HTN, depression, chronic pain who was admitted via [facility] on 11/18/15 with acute onset of left sided weakness. CTA showed M2 occlusion. He was treated with TPA and underwent cerebral angio showing occluded R-ACA proximal segment with partial distal  leptomeningeal collateralization and R-MCA without filling defects. 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  Treatment team will focus on ADLs and mobility with goals set at [facility]  See Team Conference Notes for weekly updates to the plan of care

## 2015-11-24 NOTE — Evaluation (Signed)
Physical Therapy Assessment and Plan  Patient Details  Name: Taylor Obrien MRN: [id number] Date of Birth: 25-Feb-1966  PT Diagnosis: Abnormal posture, Difficulty walking, Edema, Hemiplegia non-dominant, Hypotonia, Impaired cognition, Impaired sensation, Muscle weakness, Pain in joint and Paralysis Rehab Potential: Good ELOS: 21-28 days   Today's Date: 11/24/2015 PT Individual Time: 0800-0900 PT Individual Time Calculation (min): 60 min    Problem List:  Patient Active Problem List   Diagnosis Date Noted  . 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 Gait disturbance, post-stroke 11/23/2015  . Stroke due to occlusion of right anterior cerebral artery ([place]) 11/23/2015  . Cerebrovascular accident (CVA) due to occlusion of right anterior cerebral artery ([place])   . Essential hypertension   . Depression   . Chronic pain syndrome   . ETOH abuse   . Marijuana abuse   . Cerebrovascular accident (CVA) due to thrombosis of right carotid artery ([place])   . Malignant hypertension   . Hyperlipidemia   . CVA (cerebral infarction) 11/18/2015  . Stroke (cerebrum) ([place]) 11/18/2015  . Chest pain 10/09/2012  . GERD (gastroesophageal reflux disease) 10/09/2012  . Chronic back pain 10/09/2012  . Tobacco abuse 10/09/2012  . Hypertensive heart disease 08/31/2012  . Accelerated hypertension 08/31/2012  . Precordial pain 08/31/2012    Past Medical History:  Past Medical History  Diagnosis Date  . Essential hypertension, benign   . Hyperlipidemia   . Depression   . Chronic pain   . GERD (gastroesophageal reflux disease)    Past Surgical History:  Past Surgical History  Procedure Laterality Date  . Right rotator cuff repair    . Closed reduction mandibular fracture w/ arch bars      + multiple extractions  . Removal foreign body right shoulder    . Condyloma resection    . Radiology with anesthesia N/A  11/18/2015    Procedure: RADIOLOGY WITH ANESTHESIA;  Surgeon: Luanne Bras, MD;  Location: [place];  Service: Radiology;  Laterality: N/A;  . Tee without cardioversion N/A 11/21/2015    Procedure: TRANSESOPHAGEAL ECHOCARDIOGRAM (TEE);  Surgeon: Larey Dresser, MD;  Location: [facility] ENDOSCOPY;  Service: Cardiovascular;  Laterality: N/A;    Assessment & Plan Clinical Impression: Patient is a 49 y.o. year old male with recent admission to the hospital with history of HTN, depression, chronic pain who was admitted via [facility] on 11/18/15 with acute onset of left sided weakness. CTA showed M2 occlusion. He was treated with TPA and underwent cerebral angio showing occluded R-ACA proximal segment with partial distal leptomeningeal collateralization and R-MCA without filling defects. 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 UDS positive for THC. Neurology feels that patient with embolic stroke of unknown etiology and 30 day monitor recommended by cardiology as TEE showed moderate LVH with EF 55-60% but no thrombus, ASD or PFO. Patient has had malignant requiring IV labetalol and Neurology recommends permissive HTN but gradually normalize in 5-7 days. Swallow evaluation done and patient started on regular diet. Patient with resultant right sided weakness, left inattention, poor awareness of deficits, poor safety with strong pusher tendencies. Therapy ongoing and working on pre-gait activity a well as [name] stability..  Patient transferred to CIR on 11/23/2015 .   Patient currently requires total assist +2 with mobility secondary to muscle weakness, muscle joint tightness and muscle paralysis, decreased  cardiorespiratoy endurance, abnormal tone, decreased coordination and decreased motor planning, decreased midline orientation and decreased motor  planning, decreased awareness, decreased problem solving and decreased safety awareness and decreased sitting balance, decreased standing balance, decreased postural control, hemiplegia and decreased balance strategies.  Prior to hospitalization, patient was independent  with mobility and lived with Spouse (4 children: 2 boys and 2 girls  57-16 y/o) in a [place] home.  Home access is 4Stairs to enter.  Patient will benefit from skilled PT intervention to maximize safe functional mobility, minimize fall risk and decrease caregiver burden for planned discharge home with 24 hour supervision.  Anticipate patient will benefit from follow up [place] at discharge.  PT - End of Session Activity Tolerance: Decreased this session Endurance Deficit: Yes PT Assessment Rehab Potential (ACUTE/IP ONLY): Good Barriers to Discharge: Inaccessible home environment PT Patient demonstrates impairments in the following area(s): Balance;Edema;Endurance;Motor;Pain;Perception;Safety;Sensory;Skin Integrity PT Transfers Functional Problem(s): Bed Mobility;Bed to Chair;Car;Furniture PT Locomotion Functional Problem(s): Ambulation;Wheelchair Mobility;Stairs PT Plan PT Intensity: Minimum of 1-2 x/day ,45 to 90 minutes PT Frequency: 5 out of 7 days PT Duration Estimated Length of Stay: 21-28 days PT Treatment/Interventions: Ambulation/gait training;Balance/vestibular training;Cognitive remediation/compensation;Community reintegration;Discharge planning;Disease management/prevention;DME/adaptive equipment instruction;Functional mobility training;Functional electrical stimulation;Neuromuscular re-education;Pain management;Patient/family education;Psychosocial support;Skin care/wound management;Splinting/orthotics;Stair training;Therapeutic Activities;Therapeutic Exercise;UE/LE Strength taining/ROM;UE/LE Coordination activities;Visual/perceptual remediation/compensation;Wheelchair propulsion/positioning PT Transfers Anticipated  Outcome(s): min assist PT Locomotion Anticipated Outcome(s): supervision w/c mobility; mod assist gait short distances PT Recommendation Recommendations for Other Services: Speech consult Follow Up Recommendations: Home health PT;24 hour supervision/assistance Patient destination: Home Equipment Recommended: Wheelchair (measurements);Wheelchair cushion (measurements);Other (comment) (assistive device TBD)  Skilled Therapeutic Intervention Individual treatment initiated with focus on orientation to PT POC and goals, neuro re-ed for functional balance, reorientation to midline, and postural control re-training, functional transfers, initiated gait training, w/c positioning, and education on importance and attention to [name]/[name] during mobility. Pt noted with impaired cognition demonstrating decreased awareness, impulsivity, impaired sequencing, and impaired problem solving with recommendation for speech consult to assess. Pt's wife present during session and helpful and supportive. Education ongoing.  PT Evaluation Precautions/Restrictions Precautions Precautions: Fall Precaution Comments: [name] Restrictions Weight Bearing Restrictions: No Pain Reports discomfort in shoulders and neck - repositioned.  Home Living/Prior Functioning Home Living Available Help at Discharge: Family Type of Home: Apartment Home Access: Stairs to enter Technical brewer of Steps: 4 Entrance Stairs-Rails: Right;Left;Can reach both Home Layout: One level Bathroom Shower/Tub: Optometrist: Yes Additional Comments: works for the [employer], part-time 4 hrs/wk  Lives With: Spouse (4 children: 2 boys and 2 girls  38-16 y/o) Prior Function Level of Independence: Independent with basic ADLs  Able to Take Stairs?: Yes Driving: Yes Vocation: Part time employment Vision/Perception  Vision - Assessment Eye Alignment: Within Functional  Limits Ocular Range of Motion: Within Functional Limits Perception Perception: Impaired Inattention/Neglect:  (L inattention noted) Spatial Orientation: impaired orientation to midline. Pt with heavy L lateral lean  Cognition Orientation Level: Oriented X4 Attention: Sustained Sustained Attention: Appears intact Memory: Impaired Memory Impairment: Storage deficit;Decreased recall of new information Awareness: Impaired Awareness Impairment: Emergent impairment;Anticipatory impairment Safety/Judgment: Impaired Comments: very impulsive Sensation Sensation Light Touch: Impaired Detail Light Touch Impaired Details: Absent [name];Impaired [name] Proprioception: Impaired Detail Proprioception Impaired Details: Absent [name];Impaired [name] Coordination Gross Motor Movements are Fluid and Coordinated: No Motor  Motor Motor: Hemiplegia;Abnormal tone;Abnormal postural alignment and control   Trunk/Postural Assessment  Cervical Assessment Cervical Assessment: Within Functional Limits Thoracic Assessment Thoracic Assessment: Exceptions to [facility] (impaired balance; L lateral  lean) Lumbar Assessment Lumbar Assessment: Exceptions to [facility] (posterior tilt) Postural Control Postural Control: Deficits on evaluation Trunk Control: impaired and L lateral lean  Balance Balance Balance Assessed: Yes Static Sitting Balance Static Sitting - Level of Assistance: 1: +2 Total assist;3: Mod assist Dynamic Sitting Balance Dynamic Sitting - Level of Assistance: 1: +1 Total assist Static Standing Balance Static Standing - Level of Assistance: 1: +2 Total assist Dynamic Standing Balance Dynamic Standing - Level of Assistance: 1: +2 Total assist Extremity Assessment     See OT evaluation for UE assessment RLE Assessment RLE Assessment: Within Functional Limits [name] Assessment [name] Assessment: Exceptions to [facility] [name] Strength [name] Overall Strength Comments: No active movement noted. Maybe trace at hip [name]  Tone [name] Tone: Hypotonic   See Function Navigator for Current Functional Status.   Refer to Care Plan for Long Term Goals  Recommendations for other services: Other: speech consult  Discharge Criteria: Patient will be discharged from PT if patient refuses treatment 3 consecutive times without medical reason, if treatment goals not met, if there is a change in medical status, if patient makes no progress towards goals or if patient is discharged from hospital.  The above assessment, treatment plan, treatment alternatives and goals were discussed and mutually agreed upon: by patient and by family  Juanna Cao, PT, DPT  11/24/2015, 12:29 PM

## 2015-11-24 NOTE — H&P (View-Only) (Signed)
Physical Medicine and Rehabilitation Admission H&P   Chief Complaint  Patient presents with  . Left sided weakness, right gaze preference with left inattention    HPI: Taylor Obrien is a 49 y.o. male with history of HTN, depression, chronic pain who was admitted via [facility] on 11/18/15 with acute onset of left sided weakness. CTA showed M2 occlusion. He was treated with TPA and underwent cerebral angio showing occluded R-ACA proximal segment with partial distal leptomeningeal collateralization and R-MCA without filling defects. 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 UDS positive for THC. Neurology feels that patient with embolic stroke of unknown etiology and 30 day monitor recommended by cardiology as TEE showed moderate LVH with EF 55-60% but no thrombus, ASD or PFO. Patient has had malignant requiring IV labetalol and Neurology recommends permissive HTN but gradually normalize in 5-7 days. Swallow evaluation done and patient started on regular diet. Patient with resultant right sided weakness, left inattention, poor awareness of deficits, poor safety with strong pusher tendencies. Therapy ongoing and working on pre-gait activity a well as [name] stability. CIR mmended for further therapies.   "I want to be home by Christmas"  Review of Systems  HENT: Negative for hearing loss.  Eyes: Negative for blurred vision and double vision.  Respiratory: Negative for cough and shortness of breath.  Cardiovascular: Negative for chest pain and palpitations.  Gastrointestinal: Positive for constipation. Negative for heartburn, nausea and abdominal pain.  Genitourinary: Positive for urgency (with incontinence) and frequency.  Musculoskeletal: Positive for back pain (chronic issues) and joint pain (chronic right shoulder issues.  ). Negative for myalgias.  Neurological: Positive for focal weakness. Negative for dizziness, tingling and headaches.  Psychiatric/Behavioral: Negative for depression. The patient has insomnia.      Past Medical History  Diagnosis Date  . Essential hypertension, benign   . Hyperlipidemia   . Depression   . Chronic pain   . GERD (gastroesophageal reflux disease)     Past Surgical History  Procedure Laterality Date  . Right rotator cuff repair    . Closed reduction mandibular fracture w/ arch bars      + multiple extractions  . Removal foreign body right shoulder    . Condyloma resection    . Radiology with anesthesia N/A 11/18/2015    Procedure: RADIOLOGY WITH ANESTHESIA; Surgeon: Luanne Bras, MD; Location: [place]; Service: Radiology; Laterality: N/A;  . Tee without cardioversion N/A 11/21/2015    Procedure: TRANSESOPHAGEAL ECHOCARDIOGRAM (TEE); Surgeon: Larey Dresser, MD; Location: [facility] ENDOSCOPY; Service: Cardiovascular; Laterality: N/A;    Family History  Problem Relation Age of Onset  . Diabetes Mother   . Hypertension Mother   . Diabetes Father   . Hypertension Father   . Diabetes Brother   . Hypertension Brother      Social History: Married. Lives with wife and 4 children in a first floor apt. 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 Wife laid off couple of months ago and is available 24/7 at discharge. He reports that he has been smoking Cigarettes-1 PPD. He has a 15 pack-year smoking history. He has never used smokeless tobacco. He reports that he drinks three 40 ounce cans of beers/daily. He reports that he uses illicit drugs (Marijuana)---"smokes 2-3 blunts daily"     Allergies  Allergen Reactions  . Amoxicillin   . Oxcarbazepine Other (See Comments)  Patient goes out of right state of mind.   . Penicillins     Medications Prior to  Admission  Medication Sig Dispense Refill  . acetaminophen (TYLENOL) 500 MG tablet Take 1,000 mg by mouth every 6 (six) hours as needed for pain.    Marland Kitchen amLODipine (NORVASC) 10 MG tablet Take 1 tablet (10 mg total) by mouth daily. 30 tablet 0  . cloNIDine (CATAPRES) 0.3 MG tablet Take 0.3 mg by mouth at bedtime.    . hydrochlorothiazide (HYDRODIURIL) 25 MG tablet Take 1 tablet (25 mg total) by mouth daily. 30 tablet 0  . HYDROcodone-acetaminophen (NORCO) 7.5-325 MG tablet Take 1 tablet by mouth every 4 (four) hours as needed for moderate pain.      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  Transfers Overall transfer level: Needs assistance Equipment used: 2 person hand held assist Transfers: Sit to/from Stand, Stand Pivot Transfers Sit to Stand: Mod assist, +2 physical assistance Stand pivot transfers: Max assist, +2 physical assistance General transfer comment: With cueing, pt with decreased pushing today and better able to participate in transfer towards R side. Continues to need L LE blocked and increased A to  complete pivot.       ADL: ADL Overall ADL's : Needs assistance/impaired Eating/Feeding: Minimal assistance, Sitting Eating/Feeding Details (indicate cue type and reason): used cup, spoon and finger fed Grooming: Wash/dry face, Set up, Sitting Upper Body Bathing: Maximal assistance, Bed level Lower Body Bathing: Total assistance, Bed level Upper Body Dressing : Maximal assistance, Sitting Lower Body Dressing: Total assistance, Bed level Toilet Transfer: +2 for physical assistance, Maximal assistance, Stand-pivot, BSC Toileting- Clothing Manipulation and Hygiene: +2 for physical assistance, Total assistance, Sit to/from stand  Cognition: Cognition Overall Cognitive Status: Impaired/Different from baseline Arousal/Alertness: Awake/alert Orientation Level: Oriented X4 Attention: Sustained Sustained Attention: Impaired Sustained Attention Impairment: Verbal basic, Functional basic Memory: Impaired Memory Impairment: Storage deficit, Decreased recall of new information Awareness: Impaired Awareness Impairment: Emergent impairment, Anticipatory impairment Problem Solving: Impaired Problem Solving Impairment: Verbal basic, Functional basic Behaviors: Impulsive Safety/Judgment: Impaired Cognition Arousal/Alertness: Awake/alert Behavior During Therapy: WFL for tasks assessed/performed Overall Cognitive Status: Impaired/Different from baseline Area of Impairment: Attention, Following commands, Safety/judgement Current Attention Level: Selective Following Commands: Follows one step commands with increased time Safety/Judgement: Decreased awareness of safety, Decreased awareness of deficits General Comments: Less cueing needed today to attend to L side.   Physical Exam: Blood pressure 158/104, pulse 82, temperature 98.6 F (37 C), temperature source Oral, resp. rate 18, height 5\' 5"  (1.651 m), weight 98.567 kg (217 lb 4.8 oz), SpO2 98 %. Physical Exam  Nursing note and vitals  reviewed. Constitutional: He is oriented to person, place, and time. He appears well-developed and well-nourished.  HENT:  Head: Normocephalic and atraumatic.  Mouth/Throat: Oropharyngeal exudate present.  Eyes:  EOM are normal. Pupils are equal, round, and reactive to light. Right eye exhibits discharge. Left eye exhibits discharge.  Neck: Normal range of motion. Neck supple.  Cardiovascular: Normal rate and regular rhythm.  No murmur heard. Respiratory: Effort normal and breath sounds normal. No respiratory distress. He has no wheezes.  GI: Soft. Bowel sounds are normal. He exhibits no distension. There is no tenderness. There is no rebound.  Musculoskeletal: He exhibits no edema or tenderness.  Neurological: He is alert and oriented to person, place, and time.  Has poor postural reflexes with tendency to slump to the left. Left inattention but able to turn eyes to left field.  He is impulsive and lacks insight/ awareness of deficits. Able to follow simple one and two step commands. Impulsive with decreased insight into deficits.   Left facial weakness without dysarthria.  Motor: RUE/RLE: 5/5 proximal to distal [name]: 0/5 shoulder abduction, elbow flex/ext, 3/5 finger grip [name]: 0/5 proximal to distal  Skin: Skin is warm and dry. No rash noted. No erythema.  Psychiatric: He has a normal mood and affect. His speech is normal. He expresses impulsivity.     Lab Results Last 48 Hours    No results found for this or any previous visit (from the past 48 hour(s)).    Imaging Results (Last 48 hours)    No results found.       Medical Problem List and Plan: 1. 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 DVT Prophylaxis/Anticoagulation: Mechanical: Sequential compression devices, below knee Bilateral lower extremities 3. Pain Management: Denies any HA today. Will order hydrocodone prn for pain 4. Mood: LCSW to follow for evaluation and support. Has supportive girlfriend.   5. Neuropsych: This patient is not fully capable of making decisions on his own behalf. 6. Skin/Wound Care: Routine pressure relief measures. Maintain adequate nutrition and hydration status.  7. Fluids/Electrolytes/Nutrition: Monitor I/O. Appetite remains good. Check lytes in am. 8. HTN: Continue HCTZ and Norvasc daily. Is also getting catapres 0.3 mg with repeat doses at nights. Monitor every 6 hours as still labile requiring IV labetalol. Will add catapres prn additionally for SBP > 180 or DBP >105 as per Neurology parameters. 9. Dyslipidemia: On Lipitor.  10. H/o depression with anxiety: Used to see mental health in the past. Has been off meds for years.       Post Admission Physician Evaluation: 1. Functional deficits secondary to Left Hemiparesis secondary to right anterior cerebral artery infarct. 2. Patient is admitted to receive collaborative, interdisciplinary care between the physiatrist, rehab nursing staff, and therapy team. 3. [name] level of medical complexity and substantial therapy needs in context of that medical necessity cannot be provided at a lesser intensity of care such as a SNF. 4. Patient has experienced substantial functional loss from his/her baseline which was documented above under the "Functional History" and "Functional Status" headings. Judging by the patient's diagnosis, physical exam, and functional history, the patient has potential for functional progress which will result in measurable gains while on inpatient rehab. These gains will be of substantial and practical use upon discharge in facilitating mobility and self-care at the household level. 5. Physiatrist will provide 24 hour management of medical needs as well as oversight of the therapy plan/treatment and provide guidance as appropriate regarding the interaction of the two. 6. 24 hour rehab nursing will assist with bladder management, bowel management, safety, skin/wound care, disease  management, medication administration, pain management and patient education and help integrate therapy concepts, techniques,education,  etc. 7. PT will assess and treat for/with: pre gait, gait training, endurance , safety, equipment, neuromuscular re education. Goals are: Sup/MinA. 8. OT will assess and treat for/with: ADLs, Cognitive perceptual skills, Neuromuscular re education, safety, endurance, equipment. Goals are: Min assist to supervision for ADLs, maximize neuromuscular reeducation left upper extremity. Therapy may proceed with showering this patient. 9. SLP will assess and treat for/with: Evaluate  attention, concentration, problem solving. Goals are: Supervision medication management.Increase awareness of deficits 10. Case Management and Social Worker will assess and treat for psychological issues and discharge planning. 11. Team conference will be held weekly to assess progress toward goals and to determine barriers to discharge. 12. Patient will receive at least 3 hours of therapy per day at least 5 days per week. 13. ELOS: 20-22 days  14. Prognosis: good     Charlett Blake M.D. [facility] FAAPM&R (Sports Med, Neuromuscular Med) Diplomate [place] Board of Electrodiagnostic Med  11/23/2015

## 2015-11-25 ENCOUNTER — Inpatient Hospital Stay (HOSPITAL_COMMUNITY): Payer: Medicaid Other | Admitting: Occupational Therapy

## 2015-11-25 ENCOUNTER — Inpatient Hospital Stay (HOSPITAL_COMMUNITY): Payer: Medicaid Other | Admitting: Physical Therapy

## 2015-11-25 ENCOUNTER — Inpatient Hospital Stay (HOSPITAL_COMMUNITY): Payer: Medicaid Other | Admitting: Speech Pathology

## 2015-11-25 MED ORDER — HYDROCHLOROTHIAZIDE 25 MG PO TABS
25.0000 mg | ORAL_TABLET | Freq: Every day | ORAL | Status: DC
  Administered 2015-11-25 – 2015-11-29 (×5): 25 mg via ORAL
  Filled 2015-11-25 (×4): qty 1

## 2015-11-25 NOTE — Evaluation (Signed)
Speech Language Pathology Assessment and Plan  Patient Details  Name: Taylor Obrien MRN: [id number] Date of Birth: 1966/10/20  SLP Diagnosis: Cognitive Impairments;Dysphagia  Rehab Potential: Good ELOS: 7-10 days    Today's Date: 11/25/2015 SLP Individual Time: 1300-1400 SLP Individual Time Calculation (min): 60 min   Problem List:  Patient Active Problem List   Diagnosis Date Noted  . Hemiplegia and hemiparesis following unspecified cerebrovascular disease affecting left non-dominant side ([place]) 11/23/2015  . Gait disturbance, post-stroke 11/23/2015  . Stroke due to occlusion of right anterior cerebral artery ([place]) 11/23/2015  . Cerebrovascular accident (CVA) due to occlusion of right anterior cerebral artery ([place])   . Essential hypertension   . Depression   . Chronic pain syndrome   . ETOH abuse   . Marijuana abuse   . Cerebrovascular accident (CVA) due to thrombosis of right carotid artery ([place])   . Malignant hypertension   . Hyperlipidemia   . CVA (cerebral infarction) 11/18/2015  . Stroke (cerebrum) ([place]) 11/18/2015  . Chest pain 10/09/2012  . GERD (gastroesophageal reflux disease) 10/09/2012  . Chronic back pain 10/09/2012  . Tobacco abuse 10/09/2012  . Hypertensive heart disease 08/31/2012  . Accelerated hypertension 08/31/2012  . Precordial pain 08/31/2012   Past Medical History:  Past Medical History  Diagnosis Date  . Essential hypertension, benign   . Hyperlipidemia   . Depression   . Chronic pain   . GERD (gastroesophageal reflux disease)    Past Surgical History:  Past Surgical History  Procedure Laterality Date  . Right rotator cuff repair    . Closed reduction mandibular fracture w/ arch bars      + multiple extractions  . Removal foreign body right shoulder    . Condyloma resection    . Radiology with anesthesia N/A 11/18/2015    Procedure: RADIOLOGY WITH ANESTHESIA;  Surgeon: Luanne Bras, MD;  Location: [place];  Service: Radiology;   Laterality: N/A;  . Tee without cardioversion N/A 11/21/2015    Procedure: TRANSESOPHAGEAL ECHOCARDIOGRAM (TEE);  Surgeon: Larey Dresser, MD;  Location: [place];  Service: Cardiovascular;  Laterality: N/A;    Assessment / Plan / Recommendation Clinical Impression Taylor Obrien is a 49 y.o. male with history of HTN, depression, chronic pain who was admitted via [facility] on 11/18/15 with acute onset of left sided weakness.  CTA showed M2 occlusion. He was treated with TPA and underwent cerebral angio showing occluded R-ACA proximal segment with partial  distal leptomeningeal collateralization and R-MCA without filling defects. 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 UDS positive for THC.  Neurology feels that patient with embolic stroke of unknown etiology and 30 day monitor recommended by cardiology as TEE showed moderate LVH with EF 55-60% but no thrombus, ASD or PFO.  Patient has had malignant requiring IV labetalol and Neurology recommends permissive HTN  but gradually normalize in 5-7 days.  Swallow evaluation done and patient started on regular diet.  Patient with resultant right sided weakness, left inattention, poor awareness of deficits, poor safety with strong pusher tendencies. Therapy ongoing and working on pre-gait activity a well as [name] stability.   CIR recommended for further therapies. Patient would benefit from rehabilitation to increase cognition and overall functional independence related to problem solving, safety awareness, memory and safe swallow.  Skilled Therapeutic Interventions  Bedside swallow evaluation completed as well as Oral Motor examination. Pt noted with L-sided weakness and decreased ROM. Mild-Mod pocketing noted in L-sulci. Pt seemed to be aware of pocketed material and cleared with tongue and/or  finger sweep. No overt s/s of aspiration noted with regular/thin liquids. Speech-language evaluation completed. Pt noted with mild-mod cognitive deficits. Pt limited by impulsive behaviors throughout evaluation, requiring verbal cues for redirection. Expressive and Receptive communication abilities appear WFL, as well as reading and writing abilities. Pt noted with decreased problem solving and recall. Pt was 60% accurate with simple problem solving scenarios and 50% accurate with recall of new information. Pt reports that he handles his own medication and finances at home.   SLP Assessment  Patient will need skilled Speech Lanaguage Pathology Services during CIR admission    Recommendations  SLP Diet Recommendations: Thin;Age appropriate regular solids Liquid Administration via: Cup;Straw Medication Administration: Whole meds with liquid Supervision: Patient able to self feed;Intermittent supervision to cue for compensatory strategies Compensations: Slow rate;Small sips/bites Postural Changes and/or Swallow Maneuvers: Seated upright 90 degrees Oral Care Recommendations: Oral care BID Patient destination: Home Follow up Recommendations: None Equipment Recommended: None recommended by SLP    SLP Frequency 3 to 5 out of 7 days   SLP Treatment/Interventions Cognitive remediation/compensation;Functional tasks;Oral motor exercises;Cueing hierarchy;Internal/external aids;Patient/family education;Medication managment;Speech/Language facilitation;Therapeutic Activities;Therapeutic Exercise;Environmental controls;Dysphagia/aspiration precaution training   Pain Pain Assessment Pain Assessment: No/denies pain Prior Functioning Cognitive/Linguistic Baseline: Within functional limits Type of Home: Apartment  Lives With: Spouse Available Help at Discharge: Family Education: GED Vocation: Part time employment  Function:  Eating Eating   Modified Consistency Diet: No Eating Assist Level: Set up  assist for           Cognition Comprehension Comprehension assist level: Understands basic 90% of the time/cues < 10% of the time  Expression   Expression assist level: Expresses basic 90% of the time/requires cueing < 10% of the time.  Social Interaction Social Interaction assist level: Interacts appropriately 75 - 89% of the time - Needs redirection for appropriate language or to initiate interaction.  Problem Solving Problem solving assist level: Solves basic 25 - 49% of the time - needs direction more than half the time to initiate, plan or complete simple activities  Memory Memory assist level: Recognizes or recalls 50 - 74% of the time/requires cueing 25 - 49% of the time   Short Term Goals: Week 1: SLP Short Term Goal 1 (Week 1): Pt will locate call bell and demonstrate appropriate use, min A SLP Short Term Goal 2 (Week 1): pt will demonstrate functional problem solving for mildly complex tasks with supervision and min verbal cues.  SLP Short Term Goal 3 (Week 1): Pt will utilize external aids to recall new information, min A SLP Short Term Goal 4 (Week 1): Pt will complete oral motor exercises to decrease pocketed in L sulci, min A  Refer to Care Plan for Long Term Goals  Recommendations for other services: None  Discharge Criteria: Patient will be discharged from SLP if patient refuses treatment 3 consecutive times without medical reason, if treatment goals not met, if there is a change in medical status, if patient makes no progress towards goals or if patient is discharged from hospital.  The above assessment, treatment plan, treatment alternatives and goals were discussed and mutually agreed upon: by patient  Adele Dan 11/25/2015, 3:58 PM

## 2015-11-25 NOTE — Progress Notes (Signed)
Occupational Therapy Session Note  Patient Details  Name: Taylor Obrien MRN: [id number] Date of Birth: 06/15/1966  Today's Date: 12/17/20161000-1100   (60 min)  1st session      Short Term Goals: Week 1:  OT Short Term Goal 1 (Week 1): Pt will complete upper body dressing sitting at EOB with mod assist OT Short Term Goal 2 (Week 1): Pt will transfer from w/c to drop-arm commode with max assist OT Short Term Goal 3 (Week 1): Pt will complete 3 of 5 grooming tasks with min cues to sequence OT Short Term Goal 4 (Week 1): Pt will demo improved attention to left as evidenced by ability to locate supplies needed for BADL with min vc OT Short Term Goal 5 (Week 1): Pt will bathe seated at sink with mod assist to maintain supported standing while washing buttocks. :     Skilled Therapeutic Interventions/Progress Updates:    1.  Skilled OT intervention with treatment focus on the following: sitting balance, transfers, sit to stand, [name] NMRE, midline control and decreased leaning to left.    Engaged in bathing and dressing at sink.  OT instructs pt on putting weight through right hip to obtain symmetrical posture.  Did sit to stand with +2 for safety with max cues to WB on right leg.  Pt. Left in wc at end with all needs in reach.    2.   Time:  1545-1615   (30 min) Pain:  none Individual session  Brushed teeth with [name] and mod guiding.  Did [name] exercises with finger tapping and placing clothe from lap tray to rolling table.  Pt perserverated on activiy and he was aware of it.  He commented that "I just can not stop."   Pt. Left in wc at end with all needs in reach.      Therapy Documentation Precautions:  Precautions Precautions: Fall Precaution Comments: [name] Restrictions Weight Bearing Restrictions: No       Pain:  none   ADL: ADL ADL Comments: see Functional Tool   See Function Navigator for Current Functional Status.   Therapy/Group: Individual Therapy  Lisa Roca 11/25/2015, 10:10 AM

## 2015-11-25 NOTE — Progress Notes (Signed)
49 y.o. male with history of HTN, depression, chronic pain who was admitted via [facility] on 11/18/15 with acute onset of left sided weakness. CTA showed M2 occlusion. He was treated with TPA and underwent cerebral angio showing occluded R-ACA proximal segment with partial distal leptomeningeal collateralization and R-MCA without filling defects. 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 UDS positive for THC. Neurology feels that patient with embolic stroke of unknown etiology and 30 day monitor recommended by cardiology as TEE showed moderate LVH with EF 55-60% but no thrombus  Subjective/Complaints: Had a good night. Denies headache. Appetite good. Making progress with therapies.  ROS: Pt denies fever, rash/itching, headache, blurred or double vision, nausea, vomiting, abdominal pain, diarrhea, chest pain, shortness of breath, palpitations, dysuria, dizziness, neck or back pain, bleeding, anxiety, or depression   Objective: Vital Signs: Blood pressure 117/76, pulse 84, temperature 99 F (37.2 C), temperature source Oral, resp. rate 18, SpO2 100 %. No results found. Results for orders placed or performed during the hospital encounter of 11/23/15 (from the past 72 hour(s))  Comprehensive metabolic panel     Status: Abnormal   Collection Time: 11/24/15  5:03 AM  Result Value Ref Range   Sodium 135 135 - 145 mmol/L   Potassium 3.5 3.5 - 5.1 mmol/L   Chloride 96 (L) 101 - 111 mmol/L   CO2 28 22 - 32 mmol/L   Glucose, Bld 126 (H) 65 - 99 mg/dL   BUN 16 6 - 20 mg/dL   Creatinine, Ser 1.48 (H) 0.61 - 1.24 mg/dL   Calcium 9.8 8.9 - 10.3 mg/dL   Total Protein 7.0 6.5 - 8.1 g/dL   Albumin 3.1 (L) 3.5 - 5.0 g/dL   AST 51 (H) 15 - 41 U/L   ALT 55 17 - 63 U/L   Alkaline Phosphatase 48 38 - 126 U/L   Total Bilirubin 0.9 0.3 - 1.2 mg/dL   GFR calc non Af Amer 55 (L) >60 mL/min   GFR calc Af Amer >60 >60 mL/min    Comment: (NOTE) The eGFR has been calculated using the CKD EPI equation. This calculation has not been validated in all clinical situations. eGFR's persistently <60 mL/min signify possible Chronic Kidney Disease.    Anion gap 11 5 - 15     HEENT: normal Cardio: RRR and no murmur Resp: CTA B/L and unlabored GI: BS positive and NT, ND Extremity:  Pulses positive and No Edema Skin:   Intact Neuro: Alert/Oriented, Abnormal Sensory absent in [name] and [name], Abnormal Motor 2- Left biceps and triceps, 3- grip , 0/5 [name]    and Abnormal FMC Ataxic/ dec FMC. Early tone [name]. 3-4 beats clonus at ankle left Musc/Skel:  Other no pain with UE and LE ROM Gen NAD   Assessment/Plan: 1. Functional deficits secondary to Right ACA infarct with Left hemiparesis  which require 3+ hours per day of interdisciplinary therapy in a comprehensive inpatient rehab setting. Physiatrist is providing close team supervision and 24 hour management of active medical problems listed below. Physiatrist and rehab team continue to assess barriers to discharge/monitor patient progress toward functional and medical goals. FIM: Function - Bathing Position: Wheelchair/chair at sink Body parts bathed by patient: Left arm, Chest, Abdomen, Left upper leg Body parts bathed by helper: Right arm, Back Assist Level: Touching or steadying assistance(Pt > 75%)  Function- Upper Body Dressing/Undressing  What is the patient wearing?: Hospital gown Assist Level: Touching or steadying assistance(Pt > 75%) Function - Lower Body Dressing/Undressing What is the patient wearing?: Hospital Gown Position: Wheelchair/chair at sink Assist for lower body dressing: Touching or steadying assistance (Pt > 75%)  Function - Toileting Toileting steps completed by patient: Adjust clothing prior to toileting, Performs perineal hygiene, Adjust clothing after toileting Assist  level: Two helpers  Function - Air cabin crew transfer assistive device: Elevated toilet seat/BSC over toilet Assist level to toilet: 2 helpers Assist level from toilet: 2 helpers  Function - Chair/bed transfer Chair/bed transfer [place]: Squat pivot Chair/bed transfer assist level: 2 helpers  Function - Locomotion: Wheelchair Will patient use wheelchair at discharge?: Yes Type: Manual Max wheelchair distance: 150' Assist Level: Maximal assistance (Pt 25 - 49%) (mod-max A) Assist Level: Maximal assistance (Pt 25 - 49%) Assist Level: Maximal assistance (Pt 25 - 49%) Turns around,maneuvers to table,bed, and toilet,negotiates 3% grade,maneuvers on rugs and over doorsills: No Function - Locomotion: Ambulation Assistive device: Hand held assist Max distance: 2 Assist level: 2 helpers Walk 10 feet activity did not occur: Safety/medical concerns Walk 50 feet with 2 turns activity did not occur: Safety/medical concerns Walk 150 feet activity did not occur: Safety/medical concerns Walk 10 feet on uneven surfaces activity did not occur: Safety/medical concerns  Function - Comprehension Comprehension: Auditory Comprehension assist level: Understands basic 90% of the time/cues < 10% of the time  Function - Expression Expression: Verbal Expression assist level: Expresses basic 90% of the time/requires cueing < 10% of the time.  Function - Social Interaction Social Interaction assist level: Interacts appropriately 90% of the time - Needs monitoring or encouragement for participation or interaction.  Function - Problem Solving Problem solving assist level: Solves basic 25 - 49% of the time - needs direction more than half the time to initiate, plan or complete simple activities  Function - Memory Memory assist level: Recognizes or recalls 50 - 74% of the time/requires cueing 25 - 49% of the time Patient normally able to recall (first 3 days only): Current season, That he or she  is in a hospital  Medical Problem List and Plan: 1.  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  DVT Prophylaxis/Anticoagulation: Mechanical: Sequential compression devices, below knee Bilateral lower extremities, Screening doppler today 3. Pain Management: no h/a's.  hydrocodone prn for pain 4. Mood: LCSW to follow for evaluation and support.      5. Neuropsych: This patient is not fully capable of making decisions on his own behalf. 6. Skin/Wound Care: Routine pressure relief measures. Maintain adequate nutrition and hydration status.   7. Fluids/Electrolytes/Nutrition: Monitor I/O. Appetite remains good.  8.  HTN: increase HCTZ to 76m and cont 16m Norvasc daily. Is also getting catapres 0.3 mg with repeat doses at night prn.     catapres prn additionally for SBP > 180 or DBP >105 as per Neurology parameters. 9. Dyslipidemia: On Lipitor.   10. H/o depression with anxiety: Used to see mental health in the past. Has been off meds for years.   LOS (Days) 2 A FACE TO FACE EVALUATION WAS PERFORMED  Janyth Riera T 11/25/2015, 7:41 AM

## 2015-11-25 NOTE — Progress Notes (Signed)
Physical Therapy Session Note  Patient Details  Name: Taylor Obrien MRN: [id number] Date of Birth: 11/18/1966  Today's Date: 11/25/2015 PT Individual Time: 0800-0900 PT Individual Time Calculation (min): 60 min   Short Term Goals: Week 1:  PT Short Term Goal 1 (Week 1): Pt will be able to demonstrate static sitting balance at midline with mod assist x 5 min PT Short Term Goal 2 (Week 1): Pt will be able to perform basic transfers on level surfaces with max assist of 1 PT Short Term Goal 3 (Week 1): Pt will be able to attend to [name] and [name] during transfers 50% of the time PT Short Term Goal 4 (Week 1): Pt will be able to propel w/c with hemitechnique x 50' with min assist  Skilled Therapeutic Interventions/Progress Updates:  Pt was seen bedside in the am. Pt transferred supine to edge of bed with head of bed elevated, side rails and max A with verbal cues and increased time. Pt transferred sit to stand with max A and verbal cues. Pt transferred edge of bed to w/c squat pivot with max A and second person stand by for safety. Pt propelled w/c about 100 feet with R UE and LE with S and multiple verbal cues, increased time for mobility. Pt transferred w/c to car with sliding board and max A with second person min A to maintain placement of sliding board with verbal cues for technique. Pt transferred car to w/c with squat pivot transfer max A and second person stand by for safety. Pt propelled w/c about 100 feet with R UE and LE with S and verbal cues. Pt transferred w/c to mat, mat to w/c with max A and verbal cues, squat pivot transfer. Pt propelled w/c back towards room about 75 feet with R LE and UE with S and verbal cues. Pt returned to room and left with call bell within reach and quick release belt in place.   Therapy Documentation Precautions:  Precautions Precautions: Fall Precaution Comments: [name] Restrictions Weight Bearing Restrictions: No  Pain: No c/o pain.   See  Function Navigator for Current Functional Status.   Therapy/Group: Individual Therapy  Dub Amis 11/25/2015, 9:38 AM

## 2015-11-26 ENCOUNTER — Inpatient Hospital Stay (HOSPITAL_COMMUNITY): Payer: Medicaid Other | Admitting: Physical Therapy

## 2015-11-26 LAB — BASIC METABOLIC PANEL
ANION GAP: 11 (ref 5–15)
BUN: 21 mg/dL — ABNORMAL HIGH (ref 6–20)
CALCIUM: 9.8 mg/dL (ref 8.9–10.3)
CO2: 28 mmol/L (ref 22–32)
Chloride: 97 mmol/L — ABNORMAL LOW (ref 101–111)
Creatinine, Ser: 1.52 mg/dL — ABNORMAL HIGH (ref 0.61–1.24)
GFR, EST NON AFRICAN AMERICAN: 53 mL/min — AB (ref >60)
GLUCOSE: 126 mg/dL — AB (ref 65–99)
POTASSIUM: 3.5 mmol/L (ref 3.5–5.1)
Sodium: 136 mmol/L (ref 135–145)

## 2015-11-26 MED ORDER — SALINE SPRAY 0.65 % NA SOLN
1.0000 | NASAL | Status: DC | PRN
  Administered 2015-12-01: 1 via NASAL
  Filled 2015-11-26: qty 44

## 2015-11-26 MED ORDER — ALPRAZOLAM 0.25 MG PO TABS
0.2500 mg | ORAL_TABLET | Freq: Two times a day (BID) | ORAL | Status: DC | PRN
  Administered 2015-11-29 – 2015-12-14 (×2): 0.25 mg via ORAL
  Filled 2015-11-26 (×2): qty 1

## 2015-11-26 NOTE — Progress Notes (Signed)
49 y.o. male with history of HTN, depression, chronic pain who was admitted via [facility] on 11/18/15 with acute onset of left sided weakness. CTA showed M2 occlusion. He was treated with TPA and underwent cerebral angio showing occluded R-ACA proximal segment with partial distal leptomeningeal collateralization and R-MCA without filling defects. 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 UDS positive for THC. Neurology feels that patient with embolic stroke of unknown etiology and 30 day monitor recommended by cardiology as TEE showed moderate LVH with EF 55-60% but no thrombus  Subjective/Complaints: Doesn't have control of left arm. It reaches, grabs, "does things on its own"---pulled out iv. Complains of epistaxis also  ROS: Pt denies fever, rash/itching, headache, blurred or double vision, nausea, vomiting, abdominal pain, diarrhea, chest pain, shortness of breath, palpitations, dysuria, dizziness, neck or back pain, bleeding, anxiety, or depression   Objective: Vital Signs: Blood pressure 151/96, pulse 86, temperature 98.8 F (37.1 C), temperature source Oral, resp. rate 18, SpO2 100 %. No results found. Results for orders placed or performed during the hospital encounter of 11/23/15 (from the past 72 hour(s))  Comprehensive metabolic panel     Status: Abnormal   Collection Time: 11/24/15  5:03 AM  Result Value Ref Range   Sodium 135 135 - 145 mmol/L   Potassium 3.5 3.5 - 5.1 mmol/L   Chloride 96 (L) 101 - 111 mmol/L   CO2 28 22 - 32 mmol/L   Glucose, Bld 126 (H) 65 - 99 mg/dL   BUN 16 6 - 20 mg/dL   Creatinine, Ser 1.48 (H) 0.61 - 1.24 mg/dL   Calcium 9.8 8.9 - 10.3 mg/dL   Total Protein 7.0 6.5 - 8.1 g/dL   Albumin 3.1 (L) 3.5 - 5.0 g/dL   AST 51 (H) 15 - 41 U/L   ALT 55 17 - 63 U/L   Alkaline Phosphatase 48 38 - 126  U/L   Total Bilirubin 0.9 0.3 - 1.2 mg/dL   GFR calc non Af Amer 55 (L) >60 mL/min   GFR calc Af Amer >60 >60 mL/min    Comment: (NOTE) The eGFR has been calculated using the CKD EPI equation. This calculation has not been validated in all clinical situations. eGFR's persistently <60 mL/min signify possible Chronic Kidney Disease.    Anion gap 11 5 - 15  Basic metabolic panel     Status: Abnormal   Collection Time: 11/26/15  5:30 AM  Result Value Ref Range   Sodium 136 135 - 145 mmol/L   Potassium 3.5 3.5 - 5.1 mmol/L   Chloride 97 (L) 101 - 111 mmol/L   CO2 28 22 - 32 mmol/L   Glucose, Bld 126 (H) 65 - 99 mg/dL   BUN 21 (H) 6 - 20 mg/dL   Creatinine, Ser 1.52 (H) 0.61 - 1.24 mg/dL   Calcium 9.8 8.9 - 10.3 mg/dL   GFR calc non Af Amer 53 (L) >60 mL/min   GFR calc Af Amer >60 >60 mL/min    Comment: (NOTE) The eGFR has been calculated using the CKD EPI equation. This calculation has not been validated in all clinical situations. eGFR's persistently <60 mL/min signify possible Chronic Kidney Disease.    Anion gap 11 5 - 15     HEENT: normal Cardio: RRR and no murmur Resp: CTA B/L and unlabored GI: BS positive and NT, ND Extremity:  Pulses positive and No Edema Skin:   Intact Neuro: Alert/Oriented, Abnormal Sensory absent in [name] and [name], Abnormal Motor 2- Left biceps and triceps, 3- grip , 0/5 [name]    and Abnormal FMC Ataxic/ dec FMC. Early tone [name]. 3-4 beats clonus at ankle left Musc/Skel:  Other no pain with UE and LE ROM Gen NAD   Assessment/Plan: 1. Functional deficits secondary to Right ACA infarct with Left hemiparesis  which require 3+ hours per day of interdisciplinary therapy in a comprehensive inpatient rehab setting. Physiatrist is providing close team supervision and 24 hour management of active medical problems listed below. Physiatrist and rehab team continue to assess barriers to discharge/monitor patient progress toward functional and medical  goals. FIM: Function - Bathing Position: Wheelchair/chair at sink Body parts bathed by patient: Left arm, Chest, Abdomen, Left upper leg, Right upper leg Body parts bathed by helper: Right arm, Back, Right lower leg, Left lower leg, Buttocks, Front perineal area Bathing not applicable: Front perineal area, Buttocks Assist Level: Touching or steadying assistance(Pt > 75%)  Function- Upper Body Dressing/Undressing What is the patient wearing?: Hospital gown, Pull over shirt/dress Assist Level: Touching or steadying assistance(Pt > 75%) Function - Lower Body Dressing/Undressing What is the patient wearing?: Hospital Gown, Pants, Non-skid slipper socks Position: Wheelchair/chair at sink Pants- Performed by helper: Thread/unthread right pants leg, Thread/unthread left pants leg, Pull pants up/down Non-skid slipper socks- Performed by helper: Don/doff right sock, Don/doff left sock Assist for footwear: Maximal assist Assist for lower body dressing: Touching or steadying assistance (Pt > 75%)  Function - Toileting Toileting activity did not occur: Refused Toileting steps completed by patient: Adjust clothing prior to toileting, Performs perineal hygiene, Adjust clothing after toileting Assist level: Two helpers  Function - Air cabin crew transfer activity did not occur: Risk analyst transfer assistive device: Elevated toilet seat/BSC over toilet Assist level to toilet: 2 helpers Assist level from toilet: 2 helpers  Function - Chair/bed transfer Chair/bed transfer method: Squat pivot Chair/bed transfer assist level: 2 helpers Chair/bed transfer assistive device: Armrests  Function - Locomotion: Wheelchair Will patient use wheelchair at discharge?: Yes Type: Manual Max wheelchair distance: 100 Assist Level: Supervision or verbal cues Assist Level: Supervision or verbal cues Assist Level: Maximal assistance (Pt 25 - 49%) Turns around,maneuvers to table,bed, and  toilet,negotiates 3% grade,maneuvers on rugs and over doorsills: No Function - Locomotion: Ambulation Assistive device: Hand held assist Max distance: 2 Assist level: 2 helpers Walk 10 feet activity did not occur: Safety/medical concerns Walk 50 feet with 2 turns activity did not occur: Safety/medical concerns Walk 150 feet activity did not occur: Safety/medical concerns Walk 10 feet on uneven surfaces activity did not occur: Safety/medical concerns  Function - Comprehension Comprehension: Auditory Comprehension assist level: Understands basic 90% of the time/cues < 10% of the time  Function - Expression Expression: Verbal Expression assist level: Expresses basic 90% of the time/requires cueing < 10% of the time.  Function - Social Interaction Social Interaction assist level: Interacts appropriately 75 - 89% of the time - Needs redirection for appropriate language or to initiate interaction.  Function - Problem Solving Problem solving assist level: Solves basic 25 - 49% of the time - needs direction more than half the time to initiate, plan or complete simple activities  Function - Memory Memory assist level: Recognizes or recalls 50 - 74% of the time/requires cueing 25 - 49% of the time Patient normally able to recall (first 3 days only): Current season, Location of own  room, Staff names and faces, That he or she is in a hospital  Medical Problem List and Plan: 1.  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  DVT Prophylaxis/Anticoagulation: Mechanical: Sequential compression devices, below knee Bilateral lower extremities, 3. Pain Management: no h/a's.  hydrocodone prn for pain 4. Mood: LCSW to follow for evaluation and support.      5. Neuropsych: This patient is not fully capable of making decisions on his own behalf. 6. Skin/Wound Care: Routine pressure  relief measures. Maintain adequate nutrition and hydration status.   7. Fluids/Electrolytes/Nutrition: Monitor I/O. Appetite remains good.  8.  HTN: increased HCTZ to 37m and cont 170m Norvasc daily. Is also getting catapres 0.3 mg with repeat doses at night prn.     catapres prn additionally for SBP > 180 or DBP >105 as per Neurology parameters.  -some improvement yesterday 9. Dyslipidemia: On Lipitor.   10. H/o depression with anxiety: Used to see mental health in the past. Has been off meds for years.   LOS (Days) 3 A FACE TO FACE EVALUATION WAS PERFORMED  SWARTZ,ZACHARY T 11/26/2015, 7:50 AM

## 2015-11-26 NOTE — Progress Notes (Signed)
Physical Therapy Session Note  Patient Details  Name: Taylor Obrien MRN: [id number] Date of Birth: 1966-03-27  Today's Date: 11/26/2015 PT Individual Time: 1300-1400 PT Individual Time Calculation (min): 60 min   Short Term Goals: Week 1:  PT Short Term Goal 1 (Week 1): Pt will be able to demonstrate static sitting balance at midline with mod assist x 5 min PT Short Term Goal 2 (Week 1): Pt will be able to perform basic transfers on level surfaces with max assist of 1 PT Short Term Goal 3 (Week 1): Pt will be able to attend to [name] and [name] during transfers 50% of the time PT Short Term Goal 4 (Week 1): Pt will be able to propel w/c with hemitechnique x 50' with min assist  Skilled Therapeutic Interventions/Progress Updates:  Pt was seen bedside in the pm. Pt propelled w/c about 150 feet with R UE and LE, S and verbal cues. Pt transferred w/c to edge of mat, edge of mat to w/c x 2 with max A and verbal cues. In parallel bars treatment focused on NMR. Pt stood multiple times with mod A and verbal cues. While standing treatment focused on upright posture, midline and weight shifting. Pt ambulated 25 and 4 feet in hallway with R rail and max A with second person follow with w/c. Pt required verbal cues for sequencing as well as physical assist to advance L LE and support L LE during stance phase. Pt propelled w/c back to room about 150 feet with R UE and LE, S and verbal cues. Pt left sitting up in w/c with quick release belt in place and call within reach.   Therapy Documentation Precautions:  Precautions Precautions: Fall Precaution Comments: [name] Restrictions Weight Bearing Restrictions: No General:   Pain: No c/o pain.   See Function Navigator for Current Functional Status.   Therapy/Group: Individual Therapy  Dub Amis 11/26/2015, 2:35 PM

## 2015-11-27 ENCOUNTER — Inpatient Hospital Stay (HOSPITAL_COMMUNITY): Payer: Medicaid Other | Admitting: *Deleted

## 2015-11-27 ENCOUNTER — Inpatient Hospital Stay (HOSPITAL_COMMUNITY): Payer: Medicaid Other | Admitting: Physical Therapy

## 2015-11-27 ENCOUNTER — Encounter (HOSPITAL_COMMUNITY): Payer: Medicaid Other

## 2015-11-27 ENCOUNTER — Inpatient Hospital Stay (HOSPITAL_COMMUNITY): Payer: Medicaid Other | Admitting: Speech Pathology

## 2015-11-27 ENCOUNTER — Encounter (HOSPITAL_COMMUNITY): Payer: Self-pay | Admitting: Physical Medicine & Rehabilitation

## 2015-11-27 DIAGNOSIS — N179 Acute kidney failure, unspecified: Secondary | ICD-10-CM

## 2015-11-27 DIAGNOSIS — I10 Essential (primary) hypertension: Secondary | ICD-10-CM

## 2015-11-27 HISTORY — DX: Essential (primary) hypertension: I10

## 2015-11-27 MED ORDER — TRAZODONE HCL 50 MG PO TABS: 50.0000 mg | ORAL_TABLET | Freq: Every evening | ORAL | Status: DC | PRN

## 2015-11-27 MED ORDER — TRAZODONE HCL 50 MG PO TABS
50.0000 mg | ORAL_TABLET | Freq: Every day | ORAL | Status: DC
  Administered 2015-11-27 – 2015-11-29 (×3): 50 mg via ORAL
  Filled 2015-11-27 (×3): qty 1

## 2015-11-27 MED ORDER — CLONIDINE HCL 0.3 MG/24HR TD PTWK
0.3000 mg | MEDICATED_PATCH | TRANSDERMAL | Status: DC
  Administered 2015-11-27 – 2015-12-18 (×4): 0.3 mg via TRANSDERMAL
  Filled 2015-11-27 (×4): qty 1

## 2015-11-27 MED ORDER — AMLODIPINE BESYLATE 10 MG PO TABS
10.0000 mg | ORAL_TABLET | Freq: Every day | ORAL | Status: DC
  Administered 2015-11-28 – 2015-12-01 (×4): 10 mg via ORAL
  Filled 2015-11-27 (×4): qty 1

## 2015-11-27 MED ORDER — CLONIDINE HCL 0.2 MG PO TABS
0.2000 mg | ORAL_TABLET | Freq: Three times a day (TID) | ORAL | Status: DC
  Administered 2015-11-27: 0.2 mg via ORAL
  Filled 2015-11-27: qty 1

## 2015-11-27 NOTE — Progress Notes (Signed)
Occupational Therapy Session Note  Patient Details  Name: Taylor Obrien MRN: [id number] Date of Birth: [birth year]  Today's Date: 11/27/2015 OT Individual Time: 1100-1200 OT Individual Time Calculation (min): 60 min    Short Term Goals: Week 1:  OT Short Term Goal 1 (Week 1): Pt will complete upper body dressing sitting at EOB with mod assist OT Short Term Goal 2 (Week 1): Pt will transfer from w/c to drop-arm commode with max assist OT Short Term Goal 3 (Week 1): Pt will complete 3 of 5 grooming tasks with min cues to sequence OT Short Term Goal 4 (Week 1): Pt will demo improved attention to left as evidenced by ability to locate supplies needed for BADL with min vc OT Short Term Goal 5 (Week 1): Pt will bathe seated at sink with mod assist to maintain supported standing while washing buttocks.  Skilled Therapeutic Interventions/Progress Updates: ADL-retraining with focus on weight-shifting, static/dyhamic sitting balance, transfers, effective use of DME, and improved left attention.   Pt completed bed mobility and transfers to w/c and tub bench with max vc for technique, min-max facilitation to weight shift and place [name] and steadying assist during supported static standing required to wash buttocks and pull up pants.   Overall, pt is improving in ability to maintain upright static sitting without leaning or pushing toward his left however he continues to require cues, physical assist and additional supports to correct sitting posture posteriorly.   Pt demo'd improved attention to left while grooming at sink this session.   See functional assessment tool for explicit details on performance of BADL.     Therapy Documentation Precautions:  Precautions Precautions: Fall Precaution Comments: [name] Restrictions Weight Bearing Restrictions: No  Vital Signs: Therapy Vitals BP: (!) 103/102 mmHg   Pain:No/denies pain   ADL: ADL ADL Comments: see Functional  Tool    Therapy/Group: Individual Therapy  [place] 11/27/2015, 12:31 PM

## 2015-11-27 NOTE — Progress Notes (Signed)
Speech Language Pathology Daily Session Note  Patient Details  Name: Taylor Obrien MRN: [id number] Date of Birth: 18-Jul-1966  Today's Date: 11/27/2015 SLP Individual Time: 1000-1055 SLP Individual Time Calculation (min): 55 min  Short Term Goals: Week 1: SLP Short Term Goal 1 (Week 1): Pt will locate call bell and demonstrate appropriate use with min A verbal cues in 75% of observable opportunities. SLP Short Term Goal 2 (Week 1): pt will demonstrate functional problem solving for basic and familiar tasks with min verbal cues.  SLP Short Term Goal 3 (Week 1): Pt will utilize external aids to recall daily, functional information with min A verbal and visual cues.  SLP Short Term Goal 4 (Week 1): Patient will demonstrate selective attention in a mildly distracting enviornment for 15 minutes with Min A verbal cues for redirection.  SLP Short Term Goal 5 (Week 1): Patient will attend to the left field of enviornment/[name] during functional tasks with Min A verbal cues.  SLP Short Term Goal 6 (Week 1): Patient will consume current diet with minimal overt s/s of aspiration with Mod I for use of swallowing compensatory strategies.   Skilled Therapeutic Interventions: Skilled treatment session focused on cognitive goals. SLP facilitated session by providing Mod A verbal cues to maximize safety and decrease impulsivity with functional and familiar tasks. Patient attended to the left field of environment during a reading task at the paragraph level with Min A verbal and visual cues and required Mod A verbal cues for redirection to task due to his phone constantly ringing throughout the session. Patient also required Mod-Max A verbal cues to attend to [name] throughout functional tasks. Patient left upright in bed with all needs within reach and bed alarm on. Continue with current plan of care.    Function:  Eating Eating     Eating Assist Level: Set up assist for   Eating Set Up Assist For: Opening  containers       Cognition Comprehension Comprehension assist level: Understands basic 90% of the time/cues < 10% of the time  Expression   Expression assist level: Expresses basic 90% of the time/requires cueing < 10% of the time.  Social Interaction Social Interaction assist level: Interacts appropriately 75 - 89% of the time - Needs redirection for appropriate language or to initiate interaction.  Problem Solving Problem solving assist level: Solves basic 50 - 74% of the time/requires cueing 25 - 49% of the time  Memory Memory assist level: Recognizes or recalls 50 - 74% of the time/requires cueing 25 - 49% of the time    Pain Pain Assessment Pain Assessment: 0-10 Pain Score: 6  Pain Type: Neuropathic pain Pain Location: Head Pain Orientation: Right;Left Pain Descriptors / Indicators: Throbbing Pain Frequency: Constant Pain Onset: On-going Pain Intervention(s): RN made aware  Therapy/Group: Individual Therapy  Ritika Hellickson 11/27/2015, 4:19 PM

## 2015-11-27 NOTE — ED Notes (Addendum)
Foley placed while waiting for patient's blood pressure to come down to neurologist suggested range 185/110.

## 2015-11-27 NOTE — Progress Notes (Signed)
Recreational Therapy Assessment and Plan  Patient Details  Name: Taylor Obrien MRN: [id number] Date of Birth: 1966/07/14 Today's Date: 11/27/2015  Rehab Potential: Good ELOS: 3 weeks   Assessment Clinical Impression:  Problem List:  Patient Active Problem List   Diagnosis Date Noted  . 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 Gait disturbance, post-stroke 11/23/2015  . Stroke due to occlusion of right anterior cerebral artery ([place]) 11/23/2015  . Cerebrovascular accident (CVA) due to occlusion of right anterior cerebral artery ([place])   . Essential hypertension   . Depression   . Chronic pain syndrome   . ETOH abuse   . Marijuana abuse   . Cerebrovascular accident (CVA) due to thrombosis of right carotid artery ([place])   . Malignant hypertension   . Hyperlipidemia   . CVA (cerebral infarction) 11/18/2015  . Stroke (cerebrum) ([place]) 11/18/2015  . Chest pain 10/09/2012  . GERD (gastroesophageal reflux disease) 10/09/2012  . Chronic back pain 10/09/2012  . Tobacco abuse 10/09/2012  . Hypertensive heart disease 08/31/2012  . Accelerated hypertension 08/31/2012  . Precordial pain 08/31/2012    Past Medical History:  Past Medical History  Diagnosis Date  . Essential hypertension, benign   . Hyperlipidemia   . Depression   . Chronic pain   . GERD (gastroesophageal reflux disease)    Past Surgical History:  Past Surgical History  Procedure Laterality Date  . Right rotator cuff repair    . Closed reduction mandibular fracture w/ arch bars      + multiple extractions  . Removal foreign body right shoulder    . Condyloma resection    . Radiology with anesthesia N/A 11/18/2015    Procedure: RADIOLOGY WITH ANESTHESIA; Surgeon: Luanne Bras, MD; Location: [facility]; Service:  Radiology; Laterality: N/A;  . Tee without cardioversion N/A 11/21/2015    Procedure: TRANSESOPHAGEAL ECHOCARDIOGRAM (TEE); Surgeon: Larey Dresser, MD; Location: [facility] ENDOSCOPY; Service: Cardiovascular; Laterality: N/A;    Assessment & Plan Clinical Impression: Patient is a 49 y.o. year old male with recent admission to the hospital with history of HTN, depression, chronic pain who was admitted via [facility] on 11/18/15 with acute onset of left sided weakness. CTA showed M2 occlusion. He was treated with TPA and underwent cerebral angio showing occluded R-ACA proximal segment with partial distal leptomeningeal collateralization and R-MCA without filling defects. 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 UDS positive for THC. Neurology feels that patient with embolic stroke of unknown etiology and 30 day monitor recommended by cardiology as TEE showed moderate LVH with EF 55-60% but no thrombus, ASD or PFO. Patient has had malignant requiring IV labetalol and Neurology recommends permissive HTN but gradually normalize in 5-7 days. Swallow evaluation done and patient started on regular diet. Patient with resultant right sided weakness, left inattention, poor awareness of deficits, poor safety with strong pusher tendencies. Therapy ongoing and working on pre-gait activity a well as [name] stability.. Patient transferred to CIR on 11/23/2015 .       Pt presents with decreased activity tolerance, decreased functional mobility, decreased balance, decreased coordination, decreased awareness of midline, decreased problem solving, decreased attention, decreased safety Limiting pt's independence with leisure/community pursuits.   Leisure History/Participation Premorbid leisure interest/current participation: Petra Kuba - Passenger transport manager  Interests: Music (Comment) ("spinning tracks" making music, rapping) Other Leisure Interests: Television;Movies  Leisure Participation Style: With Family/Friends Awareness of Community Resources: Good-identify 3 post discharge leisure resources Psychosocial / Spiritual Social interaction - Mood/Behavior: Cooperative Strengths/Weaknesses Patient Strengths/Abilities: Willingness to participate Patient weaknesses: Physical limitations TR Patient demonstrates impairments in the following area(s): Behavior;Edema;Endurance;Motor;Safety;Perception;Sensory  Plan Rec Therapy Plan Is patient appropriate for Therapeutic Recreation?: Yes Rehab Potential: Good Treatment times per week: Min 1 time per week >20 minutes Estimated Length of Stay: 2 weeks TR Treatment/Interventions: Adaptive equipment instruction;1:1 session;Balance/vestibular training;Functional mobility training;Community reintegration;Cognitive remediation/compensation;Patient/family education;Therapeutic activities;Recreation/leisure participation;Provide activity resources in room;Therapeutic exercise;UE/LE Coordination activities  Recommendations for other services: None  Discharge Criteria: Patient will be discharged from TR if patient refuses treatment 3 consecutive times without medical reason.  If treatment goals not met, if there is a change in medical status, if patient makes no progress towards goals or if patient is discharged from hospital.  The above assessment, treatment plan, treatment alternatives and goals were discussed and mutually agreed upon: by patient  Onset 11/27/2015, 3:51 PM

## 2015-11-27 NOTE — ED Notes (Signed)
Patient being transported to CT

## 2015-11-27 NOTE — Progress Notes (Signed)
Patient information reviewed and entered into eRehab system by Charnetta Wulff, RN, CRRN, PPS Coordinator.  Information including medical coding and functional independence measure will be reviewed and updated through discharge.    

## 2015-11-27 NOTE — ED Notes (Addendum)
Patient back in room from CT. Cardene stopped while in CT in order to infuse IVP dye. Verbal order for Cardene to remain stopped till further notice given by Dr Rogene Houston.

## 2015-11-27 NOTE — Progress Notes (Signed)
Physical Therapy Session Note  Patient Details  Name: Taylor Obrien MRN: [id number] Date of Birth: 1966-02-20  Today's Date: 11/27/2015 PT Individual Time: [id number] PT Individual Time Calculation (min): 88 min   Short Term Goals: Week 1:  PT Short Term Goal 1 (Week 1): Pt will be able to demonstrate static sitting balance at midline with mod assist x 5 min PT Short Term Goal 2 (Week 1): Pt will be able to perform basic transfers on level surfaces with max assist of 1 PT Short Term Goal 3 (Week 1): Pt will be able to attend to [name] and [name] during transfers 50% of the time PT Short Term Goal 4 (Week 1): Pt will be able to propel w/c with hemitechnique x 50' with min assist  Skilled Therapeutic Interventions/Progress Updates:   Pt received in w/c after lunch; pt reporting headache 6/10.  Assessed BP and RN notified for pain medication and HTN medication due to elevated BP.  Pt performed w/c mobility x 150' in controlled environment with R hemi technique and min A with verbal and visual cues for sequencing, safety and attention to L environment.  In gym performed car transfer training again without slideboard with squat pivot w/c <> car with max A and max verbal and tactile cues for sequencing, full anterior and lateral lean/weight shifting to minimize pushing to L and cues for full pivot to L side.  Pt continued to require max A for postural control and balance and to bring [name] into and out of car.  Transitioned to larger gym and BP reassessed; BP still high after HTN medication; RN and PA notified.  PA recommending seated activity until BP below parameters.  Pt transferred w/c > mat with max A squat pivot.  Pt performed NMR as below.  BP re-assessed by RN and below parameters.  Performed NMR during sit<>Stand activity as below.  BP assessed after sit <> stand activity with BP returning to 164/117; ceased standing activities and returned to w/c squat pivot max A.  Pt also reporting HA and dizziness; RN  notified.  Returned to room; pt elected to remain up in w/c.  Pt left with quick release belt in place and all items within reach.    Therapy Documentation Precautions:  Precautions Precautions: Fall Precaution Comments: [name] Restrictions Weight Bearing Restrictions: No Vital Signs: Therapy Vitals Temp: 98.9 F (37.2 C) Temp Source: Oral Pulse Rate: 100 Resp: 18 BP: (!) 177/98 mmHg Patient Position (if appropriate): Sitting Oxygen Therapy SpO2: 99 % O2 Device: Not Delivered Pain: Pain Assessment Pain Assessment: 0-10 Pain Score: 6  Pain Type: Neuropathic pain Pain Location: Head Pain Orientation: Right;Left Pain Descriptors / Indicators: Throbbing Pain Frequency: Constant Pain Onset: On-going Pain Intervention(s): RN made aware Other Treatments: Treatments Neuromuscular Facilitation: Left;Upper Extremity;Lower Extremity;Forced use;Activity to increase coordination;Activity to increase motor control;Activity to increase timing and sequencing;Activity to increase grading;Activity to increase sustained activation;Activity to increase lateral weight shifting;Activity to increase anterior-posterior weight shifting in sitting with focus on attention to L, sequencing, attention to task, postural control, sitting balance, coordination and motor control of [name] during pipe tree activity in unsupported sitting on mat with mod A and max verbal cues for sequencing, attention to and muscle grading of [name] as well as self-correction of LOB.  Continued NMR in sitting with reaching up, forwards and to the L for controlled weight shifting and activation of extensors in L trunk and LE during reaching in sitting and during reaching with sit <> stand from  mat for full forwards weight shifting with +2 A.      See Function Navigator for Current Functional Status.   Therapy/Group: Individual Therapy  Raylene Everts [facility] 11/27/2015, 3:45 PM

## 2015-11-27 NOTE — Plan of Care (Signed)
Problem: RH PAIN MANAGEMENT Goal: RH STG PAIN MANAGED AT OR BELOW PT'S PAIN GOAL 3 or less  Outcome: Not Progressing Reports headache pain as 8

## 2015-11-27 NOTE — Progress Notes (Signed)
Subjective/Complaints: Patient seen and examined this a.m. sleeping in bed. Patient states he had difficulty falling asleep as result of being in the hospital.  ROS: Denies CP,SOB, N/V/D   Objective: Vital Signs: Blood pressure 140/102, pulse 64, temperature 98.5 F (36.9 C), temperature source Oral, resp. rate 18, SpO2 96 %. No results found. Results for orders placed or performed during the hospital encounter of 11/23/15 (from the past 72 hour(s))  Basic metabolic panel     Status: Abnormal   Collection Time: 11/26/15  5:30 AM  Result Value Ref Range   Sodium 136 135 - 145 mmol/L   Potassium 3.5 3.5 - 5.1 mmol/L   Chloride 97 (L) 101 - 111 mmol/L   CO2 28 22 - 32 mmol/L   Glucose, Bld 126 (H) 65 - 99 mg/dL   BUN 21 (H) 6 - 20 mg/dL   Creatinine, Ser 1.52 (H) 0.61 - 1.24 mg/dL   Calcium 9.8 8.9 - 10.3 mg/dL   GFR calc non Af Amer 53 (L) >60 mL/min   GFR calc Af Amer >60 >60 mL/min    Comment: (NOTE) The eGFR has been calculated using the CKD EPI equation. This calculation has not been validated in all clinical situations. eGFR's persistently <60 mL/min signify possible Chronic Kidney Disease.    Anion gap 11 5 - 15    BP 140/102 mmHg  Pulse 64  Temp(Src) 98.5 F (36.9 C) (Oral)  Resp 18  SpO2 96% Gen NAD. Vital signs reviewed  HEENT:  Normocephalic, atraumatic Cardio: RRR and no murmur Resp: CTA B/L and unlabored GI: BS positive and NT, ND Musc/Skel:  No tenderness.  No Edema Neuro: Alert/Oriented,  Sensation diminished to light touch left upper and left lower extremity Motor: [name]: Shoulder abduction 2/5, elbow flexion/extension 3/5, finger grip 3+/5 [name]: 0/5 RUE/RLE: 5/5 Skin:   Intact. Warm and dry.  Assessment/Plan: 1. Functional deficits secondary to Right ACA infarct with Left hemiparesis  which require 3+ hours per day of interdisciplinary therapy in a comprehensive inpatient rehab setting. Physiatrist is providing close team supervision and 24 hour  management of active medical problems listed below. Physiatrist and rehab team continue to assess barriers to discharge/monitor patient progress toward functional and medical goals. FIM: Function - Bathing Position: Wheelchair/chair at sink Body parts bathed by patient: Left arm, Chest, Abdomen, Left upper leg, Right upper leg Body parts bathed by helper: Right arm, Back, Right lower leg, Left lower leg, Buttocks, Front perineal area Bathing not applicable: Front perineal area, Buttocks Assist Level: Touching or steadying assistance(Pt > 75%)  Function- Upper Body Dressing/Undressing What is the patient wearing?: Hospital gown, Pull over shirt/dress Assist Level: Touching or steadying assistance(Pt > 75%) Function - Lower Body Dressing/Undressing What is the patient wearing?: Hospital Gown, Pants, Non-skid slipper socks Position: Wheelchair/chair at sink Pants- Performed by helper: Thread/unthread right pants leg, Thread/unthread left pants leg, Pull pants up/down Non-skid slipper socks- Performed by helper: Don/doff right sock, Don/doff left sock Assist for footwear: Maximal assist Assist for lower body dressing: Touching or steadying assistance (Pt > 75%)  Function - Toileting Toileting activity did not occur: Refused Toileting steps completed by patient: Adjust clothing prior to toileting, Performs perineal hygiene, Adjust clothing after toileting Assist level: Two helpers  Function - Air cabin crew transfer activity did not occur: Risk analyst transfer assistive device: Elevated toilet seat/BSC over toilet Assist level to toilet: 2 helpers Assist level from toilet: 2 helpers  Function - Chair/bed transfer Chair/bed transfer method: Squat pivot  Chair/bed transfer assist level: Maximal assist (Pt 25 - 49%/lift and lower) Chair/bed transfer assistive device: Armrests  Function - Locomotion: Wheelchair Will patient use wheelchair at discharge?: Yes Type: Manual Max  wheelchair distance: 150 Assist Level: Supervision or verbal cues Assist Level: Supervision or verbal cues Assist Level: Supervision or verbal cues Turns around,maneuvers to table,bed, and toilet,negotiates 3% grade,maneuvers on rugs and over doorsills: No Function - Locomotion: Ambulation Assistive device: Rail in hallway Max distance: 25 Assist level: 2 helpers Walk 10 feet activity did not occur: Safety/medical concerns Assist level: 2 helpers Walk 50 feet with 2 turns activity did not occur: Safety/medical concerns Walk 150 feet activity did not occur: Safety/medical concerns Walk 10 feet on uneven surfaces activity did not occur: Safety/medical concerns  Function - Comprehension Comprehension: Auditory Comprehension assist level: Understands basic 90% of the time/cues < 10% of the time  Function - Expression Expression: Verbal Expression assist level: Expresses basic 90% of the time/requires cueing < 10% of the time.  Function - Social Interaction Social Interaction assist level: Interacts appropriately 75 - 89% of the time - Needs redirection for appropriate language or to initiate interaction.  Function - Problem Solving Problem solving assist level: Solves basic 25 - 49% of the time - needs direction more than half the time to initiate, plan or complete simple activities  Function - Memory Memory assist level: Recognizes or recalls 50 - 74% of the time/requires cueing 25 - 49% of the time Patient normally able to recall (first 3 days only): Current season, Location of own room, Staff names and faces, That he or she is in a hospital  Medical Problem List and Plan: 1.  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  DVT Prophylaxis/Anticoagulation: Mechanical: Sequential compression devices, below knee Bilateral lower extremities, 3. Pain Management:  hydrocodone prn for pain 4. Mood: LCSW to follow for evaluation and support.      5.  Neuropsych: This patient is not fully capable of making decisions on his own behalf. 6. Skin/Wound Care: Routine pressure relief measures. Maintain adequate nutrition and hydration status.   7. Fluids/Electrolytes/Nutrition: Monitor I/O. Appetite remains good.  8.  HTN: HCTZ 23m, cont 136m Norvasc daily. Is also getting catapres 0.3 mg with repeat doses at night prn.       catapres prn additionally for SBP > 180 or DBP >105 as per Neurology parameters.  Within acceptable range today 9. Dyslipidemia: On Lipitor.   10. H/o depression with anxiety: Used to see mental health in the past. Has been off meds for years.   low dose xanax started 11. AKI: Cr. 1.52 on 12/18.  Will encourage fluid intake and consider IVF if necessary.  Will recheck labs periodically.  LOS (Days) 4 A FACE TO FACE EVALUATION WAS PERFORMED  Taylor Obrien Taylor [place]ix2/19/2016, 8:41 AM

## 2015-11-28 ENCOUNTER — Inpatient Hospital Stay (HOSPITAL_COMMUNITY): Payer: Medicaid Other

## 2015-11-28 ENCOUNTER — Inpatient Hospital Stay (HOSPITAL_COMMUNITY): Payer: Medicaid Other | Admitting: Speech Pathology

## 2015-11-28 ENCOUNTER — Inpatient Hospital Stay (HOSPITAL_COMMUNITY): Payer: Medicaid Other | Admitting: Physical Therapy

## 2015-11-28 NOTE — Progress Notes (Signed)
Physical Therapy Session Note  Patient Details  Name: Taylor Obrien MRN: [id number] Date of Birth: 1966/10/30  Today's Date: 11/28/2015 PT Individual Time: 1435-1546 PT Individual Time Calculation (min): 71 min   Short Term Goals: Week 1:  PT Short Term Goal 1 (Week 1): Pt will be able to demonstrate static sitting balance at midline with mod assist x 5 min PT Short Term Goal 2 (Week 1): Pt will be able to perform basic transfers on level surfaces with max assist of 1 PT Short Term Goal 3 (Week 1): Pt will be able to attend to [name] and [name] during transfers 50% of the time PT Short Term Goal 4 (Week 1): Pt will be able to propel w/c with hemitechnique x 50' with min assist  Skilled Therapeutic Interventions/Progress Updates:  Pt received in w/c with wife present; wife requesting to learn how to perform transfers to/from toilet with use of Stedy.  BP assessed prior to beginning transfer training with Dinamap and manual cuff; BP continues to be elevated above parameters. RN notified who provide pt with medication and pt agreeable to perform transfer training with wife.  Demonstrated and had wife return demonstrate how to safely set up w/c and Stedy, where to position herself during sit <> stand, positioning of [name] and safety during transfer w/c <>toilet; also discussed safety with pt regarding attention to [name] and maintaining standing long enough to allow wife to assist with clothing management and hygiene.  Returned to w/c and pt requesting to go to Taylor Obrien.  Pt transported in w/c total A to avoid elevating BP further.  In dayroom performed NMR with [name]; following Holiday Party pt's BP reassessed with diastolic increasing to [id number] and HR 101; pt reporting throbbing headache.  Returned to Taylor Obrien station in w/c total A and Therapist, sports and PA notified of elevated BP.  PA recommending pt return to bed to rest; pt transferred w/c > bed squat pivot max A and sit > supine min-mod A.  Pt left with all items  within reach and wife present.   Therapy Documentation Precautions:  Precautions Precautions: Fall Precaution Comments: [name] Restrictions Weight Bearing Restrictions: No General: PT Amount of Missed Time (min): 19 Minutes PT Missed Treatment Reason: Other (Comment) (BP elevated above parameters) Vital Signs: Therapy Vitals Pulse Rate: (!) 101 BP: (!) 205/132 mmHg Patient Position (if appropriate): Sitting Pain: Pain Assessment Pain Score: 5  Pain Type: Neuropathic pain Pain Location: Head Pain Orientation: Left Pain Descriptors / Indicators: Throbbing Pain Onset: Gradual Pain Intervention(s): RN made aware Other Treatments: Treatments Neuromuscular Facilitation: Left;Upper Extremity;Activity to increase coordination;Activity to increase motor control;Activity to increase timing and sequencing;Activity to increase grading;Activity to increase sustained activation with [name] with focus on sustained attention to [name] in distracting environment while holding plate steady while therapist placed food on plate.  Pt then engaged in functional eating/drinking tasks with [name] with focus on coordination, motor planning, muscle grading of self feeding as well was switching between tasks of holding fork, food or drink cup.  Pt required increased time, verbal and visual cues for cessation of task or to release grasp due to perseveration.     See Function Navigator for Current Functional Status.   Therapy/Group: Individual Therapy  Taylor Obrien [facility] 11/28/2015, 3:59 PM

## 2015-11-28 NOTE — Progress Notes (Signed)
Subjective/Complaints: [name] lying in bed. He states that he feels a headache coming on and believes that his blood pressure is increasing again.  ROS: + Headaches. Denies CP,SOB, N/V/D   Objective: Vital Signs: Blood pressure 162/102, pulse 78, temperature 98.3 F (36.8 C), temperature source Oral, resp. rate 20, SpO2 99 %. No results found. Results for orders placed or performed during the hospital encounter of 11/23/15 (from the past 72 hour(s))  Basic metabolic panel     Status: Abnormal   Collection Time: 11/26/15  5:30 AM  Result Value Ref Range   Sodium 136 135 - 145 mmol/L   Potassium 3.5 3.5 - 5.1 mmol/L   Chloride 97 (L) 101 - 111 mmol/L   CO2 28 22 - 32 mmol/L   Glucose, Bld 126 (H) 65 - 99 mg/dL   BUN 21 (H) 6 - 20 mg/dL   Creatinine, Ser 1.52 (H) 0.61 - 1.24 mg/dL   Calcium 9.8 8.9 - 10.3 mg/dL   GFR calc non Af Amer 53 (L) >60 mL/min   GFR calc Af Amer >60 >60 mL/min    Comment: (NOTE) The eGFR has been calculated using the CKD EPI equation. This calculation has not been validated in all clinical situations. eGFR's persistently <60 mL/min signify possible Chronic Kidney Disease.    Anion gap 11 5 - 15    BP 162/102 mmHg  Pulse 78  Temp(Src) 98.3 F (36.8 C) (Oral)  Resp 20  SpO2 99% Gen NAD. Vital signs reviewed  HEENT:  Normocephalic, atraumatic Cardio: RRR and no murmur Resp: CTA B/L and unlabored GI: BS positive and NT, ND Musc/Skel:  No tenderness.  No Edema Neuro: Alert and Oriented Sensation diminished to light touch left upper and left lower extremity Motor: [name]: Shoulder abduction 2/5, elbow flexion/extension 3/5, finger grip 3+/5 [name]: 0/5 RUE/RLE: 5/5 Skin:   Intact. Warm and dry.  Assessment/Plan: 1. Functional deficits secondary to Right ACA infarct with Left hemiparesis  which require 3+ hours per day of interdisciplinary therapy in a comprehensive inpatient rehab setting. Physiatrist is providing close team supervision and 24 hour  management of active medical problems listed below. Physiatrist and rehab team continue to assess barriers to discharge/monitor patient progress toward functional and medical goals. FIM: Function - Bathing Position: Shower Body parts bathed by patient: Right arm, Left arm, Chest, Abdomen, Front perineal area, Right upper leg, Left upper leg, Right lower leg Body parts bathed by helper: Buttocks, Left lower leg, Back Bathing not applicable: Front perineal area, Buttocks Assist Level: Touching or steadying assistance(Pt > 75%)  Function- Upper Body Dressing/Undressing What is the patient wearing?: Pull over shirt/dress Pull over shirt/dress - Perfomed by patient: Thread/unthread right sleeve, Thread/unthread left sleeve Pull over shirt/dress - Perfomed by helper: Put head through opening, Pull shirt over trunk Assist Level: Touching or steadying assistance(Pt > 75%) Function - Lower Body Dressing/Undressing What is the patient wearing?: Non-skid slipper socks, Socks Position: Wheelchair/chair at sink Pants- Performed by helper: Thread/unthread right pants leg, Thread/unthread left pants leg, Pull pants up/down, Fasten/unfasten pants Non-skid slipper socks- Performed by helper: Don/doff right sock, Don/doff left sock Assist for footwear: Maximal assist Assist for lower body dressing: Touching or steadying assistance (Pt > 75%)  Function - Toileting Toileting activity did not occur: Refused Toileting steps completed by patient: Adjust clothing prior to toileting, Performs perineal hygiene, Adjust clothing after toileting Assist level: Touching or steadying assistance (Pt.75%), Two helpers  Function - Air cabin crew transfer activity did not occur: Refused  Toilet transfer assistive device: Mechanical lift Mechanical lift: Stedy Assist level to toilet: Moderate assist (Pt 50 - 74%/lift or lower) Assist level from toilet: Moderate assist (Pt 50 - 74%/lift or lower)  Function -  Chair/bed transfer Chair/bed transfer method: Squat pivot Chair/bed transfer assist level: Maximal assist (Pt 25 - 49%/lift and lower) Chair/bed transfer assistive device: Armrests  Function - Locomotion: Wheelchair Will patient use wheelchair at discharge?: Yes Type: Manual Max wheelchair distance: 150 Assist Level: Touching or steadying assistance (Pt > 75%) Assist Level: Touching or steadying assistance (Pt > 75%) Assist Level: Touching or steadying assistance (Pt > 75%) Turns around,maneuvers to table,bed, and toilet,negotiates 3% grade,maneuvers on rugs and over doorsills: No Function - Locomotion: Ambulation Assistive device: Rail in hallway Max distance: 25 Assist level: 2 helpers Walk 10 feet activity did not occur: Safety/medical concerns Assist level: 2 helpers Walk 50 feet with 2 turns activity did not occur: Safety/medical concerns Walk 150 feet activity did not occur: Safety/medical concerns Walk 10 feet on uneven surfaces activity did not occur: Safety/medical concerns  Function - Comprehension Comprehension: Auditory Comprehension assist level: Understands basic 90% of the time/cues < 10% of the time  Function - Expression Expression: Verbal Expression assist level: Expresses basic 90% of the time/requires cueing < 10% of the time.  Function - Social Interaction Social Interaction assist level: Interacts appropriately 75 - 89% of the time - Needs redirection for appropriate language or to initiate interaction.  Function - Problem Solving Problem solving assist level: Solves basic 50 - 74% of the time/requires cueing 25 - 49% of the time  Function - Memory Memory assist level: Recognizes or recalls 50 - 74% of the time/requires cueing 25 - 49% of the time Patient normally able to recall (first 3 days only): Current season, Location of own room, Staff names and faces, That he or she is in a hospital  Medical Problem List and Plan: 1.  Left Hemiparesis secondary  to Right ACA infarct  alien arm symptoms [name] 2.  DVT Prophylaxis/Anticoagulation: Mechanical: Sequential compression devices, below knee Bilateral lower extremities 3. Pain Management:  hydrocodone prn for pain 4. Mood: LCSW to follow for evaluation and support.      5. Neuropsych: This patient is not fully capable of making decisions on his own behalf. 6. Skin/Wound Care: Routine pressure relief measures. Maintain adequate nutrition and hydration status.   7. Fluids/Electrolytes/Nutrition: Monitor I/O. Appetite remains good.  8.  HTN: HCTZ 80m, cont 134m Norvasc daily, catapres 0.3 patch.   catapres prn additionally for SBP > 180 or DBP >105 as per Neurology parameters.  Was elevated yesterday afternoon, however appears to be trending down in addition of new medication.   Will continue to monitor  9. Dyslipidemia: On Lipitor.   10. H/o depression with anxiety: Used to see mental health in the past. Has been off meds for years.   low dose xanax started 11. AKI: Cr. 1.52 on 12/18.  Will encourage fluid intake and consider IVF if necessary.  Will recheck labs periodically.  LOS (Days) 5 A FACE TO FACE EVALUATION WAS PERFORMED  Taylor Obrien AnLorie Phenix2/20/2016, 8:44 AM

## 2015-11-28 NOTE — Progress Notes (Signed)
Occupational Therapy Session Note  Patient Details  Name: Taylor Obrien MRN: [id number] Date of Birth: 1966-01-17  Today's Date: 11/28/2015 OT Individual Time: 0930-1030 OT Individual Time Calculation (min): 60 min    Short Term Goals: Week 1:  OT Short Term Goal 1 (Week 1): Pt will complete upper body dressing sitting at EOB with mod assist OT Short Term Goal 2 (Week 1): Pt will transfer from w/c to drop-arm commode with max assist OT Short Term Goal 3 (Week 1): Pt will complete 3 of 5 grooming tasks with min cues to sequence OT Short Term Goal 4 (Week 1): Pt will demo improved attention to left as evidenced by ability to locate supplies needed for BADL with min vc OT Short Term Goal 5 (Week 1): Pt will bathe seated at sink with mod assist to maintain supported standing while washing buttocks.  Skilled Therapeutic Interventions/Progress Updates:    Pt resting in bed upon arrival with wife present.  Pt engaged in BADL retraining including bathing at shower level and dressing with sit<>stand from w/c at sink.  Pt required max A for squat pivot transfer bed->w/c and w/c<>tub bench in shower.  Pt required max verbal cues for sequencing and safety awareness.  Pt is impulsive during transitional movements.  Pt required max verbal cues for attention to [name] and positioning during sit<>stand and transfers. Pt initiates use of [name] for all functional tasks but requires mod verbal cues for [name] awareness when not engaged functionally.  Pt recalled hemi dressing strategies but required physical assistance to complete tasks. Pt requires mod A for standing balance with support at Lt knee to prevent buckling.  Focus on activity tolerance, sit<>stand, standing balance, functional transfers, attention to left, [name] attention, and safety awareness to increase independence with BADLs.  Therapy Documentation Precautions:  Precautions Precautions: Fall Precaution Comments: [name] Restrictions Weight  Bearing Restrictions: No Pain:  Pt denied pain ADL: ADL ADL Comments: see Functional Tool  See Function Navigator for Current Functional Status.   Therapy/Group: Individual Therapy  Leroy Libman 11/28/2015, 10:33 AM

## 2015-11-28 NOTE — Progress Notes (Signed)
Speech Language Pathology Daily Session Note  Patient Details  Name: Taylor Obrien MRN: [id number] Date of Birth: 1966/06/17  Today's Date: 11/28/2015 SLP Individual Time: 1130-1210 SLP Individual Time Calculation (min): 40 min  Short Term Goals: Week 1: SLP Short Term Goal 1 (Week 1): Pt will locate call bell and demonstrate appropriate use with min A verbal cues in 75% of observable opportunities. SLP Short Term Goal 2 (Week 1): pt will demonstrate functional problem solving for basic and familiar tasks with min verbal cues.  SLP Short Term Goal 3 (Week 1): Pt will utilize external aids to recall daily, functional information with min A verbal and visual cues.  SLP Short Term Goal 4 (Week 1): Patient will demonstrate selective attention in a mildly distracting enviornment for 15 minutes with Min A verbal cues for redirection.  SLP Short Term Goal 5 (Week 1): Patient will attend to the left field of enviornment/[name] during functional tasks with Min A verbal cues.  SLP Short Term Goal 6 (Week 1): Patient will consume current diet with minimal overt s/s of aspiration with Mod I for use of swallowing compensatory strategies.   Skilled Therapeutic Interventions:  Pt was seen for skilled ST targeting cognitive goals. SLP facilitated the session with a basic money management task targeting functional problem solving.  Pt required mod assist verbal cues for organization as well as to recognize and correct errors during the abovementioned task due to impulsivity and decreased awareness of errors.  Pt demonstrated slightly improved impulsivity during a medication management task but required overall min assist verbal cues to recognize and correct errors when organizing pills into a medication chart.  While propelling his wheelchair back to his room at the end of today's session, pt required overall max assist verbal, visual, and tactile cues for sequencing and awareness of obstacles on his left.  Pt left  upright in wheelchair with quick release belt donned.  Call bell left within reach.  Continue per current plan of care.    Function:  Eating Eating        Cognition Comprehension Comprehension assist level: Understands basic 90% of the time/cues < 10% of the time  Expression   Expression assist level: Expresses basic 90% of the time/requires cueing < 10% of the time.  Social Interaction Social Interaction assist level: Interacts appropriately 90% of the time - Needs monitoring or encouragement for participation or interaction.  Problem Solving Problem solving assist level: Solves basic 50 - 74% of the time/requires cueing 25 - 49% of the time  Memory Memory assist level: Recognizes or recalls 50 - 74% of the time/requires cueing 25 - 49% of the time    Pain Pain Assessment Pain Assessment: No/denies pain   Therapy/Group: Individual Therapy  Taylor Obrien, [name] 11/28/2015, 4:35 PM

## 2015-11-29 ENCOUNTER — Inpatient Hospital Stay (HOSPITAL_COMMUNITY): Payer: Medicaid Other

## 2015-11-29 ENCOUNTER — Inpatient Hospital Stay (HOSPITAL_COMMUNITY): Payer: Medicaid Other | Admitting: Speech Pathology

## 2015-11-29 DIAGNOSIS — R030 Elevated blood-pressure reading, without diagnosis of hypertension: Secondary | ICD-10-CM

## 2015-11-29 DIAGNOSIS — IMO0001 Reserved for inherently not codable concepts without codable children: Secondary | ICD-10-CM | POA: Insufficient documentation

## 2015-11-29 DIAGNOSIS — I69354 Hemiplegia and hemiparesis following cerebral infarction affecting left non-dominant side: Secondary | ICD-10-CM | POA: Insufficient documentation

## 2015-11-29 DIAGNOSIS — R040 Epistaxis: Secondary | ICD-10-CM

## 2015-11-29 DIAGNOSIS — I69959 Hemiplegia and hemiparesis following unspecified cerebrovascular disease affecting unspecified side: Secondary | ICD-10-CM

## 2015-11-29 DIAGNOSIS — I69993 Ataxia following unspecified cerebrovascular disease: Secondary | ICD-10-CM

## 2015-11-29 DIAGNOSIS — I6350 Cerebral infarction due to unspecified occlusion or stenosis of unspecified cerebral artery: Secondary | ICD-10-CM

## 2015-11-29 LAB — PROTEIN / CREATININE RATIO, URINE
Creatinine, Urine: 90.83 mg/dL
Protein Creatinine Ratio: 1.1 mg/mg{Cre} — ABNORMAL HIGH (ref 0.00–0.15)
Total Protein, Urine: 100 mg/dL

## 2015-11-29 LAB — URINE MICROSCOPIC-ADD ON: RBC / HPF: NONE SEEN RBC/hpf (ref 0–5)

## 2015-11-29 LAB — URINALYSIS, ROUTINE W REFLEX MICROSCOPIC
BILIRUBIN URINE: NEGATIVE
GLUCOSE, UA: NEGATIVE mg/dL
HGB URINE DIPSTICK: NEGATIVE
Ketones, ur: NEGATIVE mg/dL
Leukocytes, UA: NEGATIVE
Nitrite: NEGATIVE
PH: 5 (ref 5.0–8.0)
Protein, ur: 100 mg/dL — AB
SPECIFIC GRAVITY, URINE: 1.015 (ref 1.005–1.030)

## 2015-11-29 MED ORDER — CHLORTHALIDONE 50 MG PO TABS
50.0000 mg | ORAL_TABLET | Freq: Every day | ORAL | Status: DC
  Administered 2015-11-30 – 2015-12-01 (×2): 50 mg via ORAL
  Filled 2015-11-29 (×4): qty 1

## 2015-11-29 MED ORDER — CHLORTHALIDONE 25 MG PO TABS: 25.0000 mg | ORAL_TABLET | Freq: Every day | ORAL | Status: DC

## 2015-11-29 MED ORDER — LABETALOL HCL 100 MG PO TABS
100.0000 mg | ORAL_TABLET | Freq: Three times a day (TID) | ORAL | Status: DC
  Administered 2015-11-29: 100 mg via ORAL
  Filled 2015-11-29: qty 1

## 2015-11-29 MED ORDER — LABETALOL HCL 200 MG PO TABS
200.0000 mg | ORAL_TABLET | Freq: Three times a day (TID) | ORAL | Status: DC
  Administered 2015-11-29 – 2015-12-20 (×62): 200 mg via ORAL
  Filled 2015-11-29 (×42): qty 1
  Filled 2015-11-29: qty 2
  Filled 2015-11-29 (×19): qty 1

## 2015-11-29 NOTE — Consult Note (Addendum)
Taylor Obrien is an 49 y.o. male referred by Dr Letta Pate   Chief Complaint: Uncontrolled HTN HPI: 49yo BM recently hospitalized for CVA and transferred to rehab.  Asked to see for poorly controlled HTN.  Has known about HTN since at least 1996 but has not taken meds consistently.  Hx of excessive alcohol drinking and sleep apnea for which he does not use CPAP machine.  Says he was on benazepril in the past.  Has not been low Na diet.  No known hx CKD and Scr 1.1 on admission 11/18/15, however, Scr now about 1.5.  Had episode of gross hematuria and hematospermia 2 months ago that he say was related to prostatitis.  Past Medical History  Diagnosis Date  . Essential hypertension, benign   . Hyperlipidemia   . Depression   . Chronic pain   . GERD (gastroesophageal reflux disease)   . HTN (hypertension) 11/27/2015    Past Surgical History  Procedure Laterality Date  . Right rotator cuff repair    . Closed reduction mandibular fracture w/ arch bars      + multiple extractions  . Removal foreign body right shoulder    . Condyloma resection    . Radiology with anesthesia N/A 11/18/2015    Procedure: RADIOLOGY WITH ANESTHESIA;  Surgeon: Luanne Bras, MD;  Location: [place];  Service: Radiology;  Laterality: N/A;  . Tee without cardioversion N/A 11/21/2015    Procedure: TRANSESOPHAGEAL ECHOCARDIOGRAM (TEE);  Surgeon: Larey Dresser, MD;  Location: [facility] ENDOSCOPY;  Service: Cardiovascular;  Laterality: N/A;    Family History  Problem Relation Age of Onset  . Diabetes Mother   . Hypertension Mother   . Diabetes Father   . Hypertension Father   . Diabetes Brother   . Hypertension Brother   Fh neg for CKD  Social History:  reports that he has been smoking Cigarettes.  He has a 15 pack-year smoking history. He has never used smokeless tobacco. He reports that he drinks alcohol. He reports that he uses illicit drugs (Marijuana). Married and lives with wife in [place].  Has 5 children.   Works for [employer] in [place]. Allergies:  Allergies  Allergen Reactions  . Amoxicillin   . Oxcarbazepine Other (See Comments)    Patient goes out of right state of mind.   . Penicillins     Medications Prior to Admission  Medication Sig Dispense Refill  . acetaminophen (TYLENOL) 500 MG tablet Take 1,000 mg by mouth every 6 (six) hours as needed for pain.    Marland Kitchen amLODipine (NORVASC) 10 MG tablet Take 1 tablet (10 mg total) by mouth daily. 30 tablet 0  . cloNIDine (CATAPRES) 0.3 MG tablet Take 0.3 mg by mouth at bedtime.    . hydrochlorothiazide (HYDRODIURIL) 25 MG tablet Take 1 tablet (25 mg total) by mouth daily. 30 tablet 0  . HYDROcodone-acetaminophen (NORCO) 7.5-325 MG tablet Take 1 tablet by mouth every 4 (four) hours as needed for moderate pain.       Lab Results: UA: >300 protein 6-30 wbc's 0-5 rbc though this was done 11/18/15  No results for input(s): WBC, HGB, HCT, PLT in the last 72 hours. BMET No results for input(s): NA, K, CL, CO2, GLUCOSE, BUN, CREATININE, CALCIUM, PHOS in the last 72 hours.  Invalid input(s): MAG LFT No results for input(s): PROT, ALBUMIN, AST, ALT, ALKPHOS, BILITOT, BILIDIR, IBILI in the last 72 hours. No results found.  ROS: No change in vision No SOB No CP No  abd pain No arthritic CO Weakness Lt Arm and leg No dysuria  PHYSICAL EXAM: Blood pressure 194/128, pulse 95, temperature 98.1 F (36.7 C), temperature source Oral, resp. rate 18, SpO2 100 %. HEENT: PERRLA EOMI NECK:No bruits, No JVD LUNGS:Clear CARDIAC:RRR wo MRG ABD:+ BS NTND No HSM  No bruits EXT:No CCE 4/5 strength in Lt arm.  Unable to raise Lt leg off bed Pulses: 1/4 DP and PT NEURO:CNI Ox3  Assessment: 1. HTN, poorly controlled 2. 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 Large Rt stroke with residual Lt sided weakness 4. Sleep apnea, untreated 5. ETOH abuse PLAN: 1. Note renal US for RAS ordered 2. Increase labetalol to 200mg  BID 3. Recheck UA and  quantify proteinuria 4. Check SPEP due to proteinuria though my suspicion is low 5. Like to see how renal fx does before considering ARB and also make sure he will FU 6. Recheck Scr in AM 7. Discussed need to get sleep apnea treated to help with BP control as  well as need to stop drinking ETOH 8.  Dietitian to see for low Na diet 9. [place] T 11/29/2015, 11:30 AM

## 2015-11-29 NOTE — Progress Notes (Signed)
Occupational Therapy Note  Patient Details  Name: Taylor Obrien MRN: [id number] Date of Birth: 07-Feb-1966  Today's Date: 11/29/2015 OT Individual Time: 1430-1500 OT Individual Time Calculation (min): 30 min   Pt denied pain Individual therapy  Pt resting in bed upon arrival and agreeable to therapy. Pt engaged in dynamic sitting tasks while seated EOB.  Focus on increased controlled use of [name] to grasp items and place them in a different location.  Focus on lateral leans to increased sitting balance when engaging BUE in functional tasks.    [facility] 11/29/2015, 3:05 PM

## 2015-11-29 NOTE — Progress Notes (Signed)
Occupational Therapy Session Note  Patient Details  Name: Taylor Obrien MRN: [id number] Date of Birth: 26-Nov-1966  Today's Date: 11/29/2015 OT Individual Time: 0930-1030 OT Individual Time Calculation (min): 60 min    Short Term Goals: Week 1:  OT Short Term Goal 1 (Week 1): Pt will complete upper body dressing sitting at EOB with mod assist OT Short Term Goal 2 (Week 1): Pt will transfer from w/c to drop-arm commode with max assist OT Short Term Goal 3 (Week 1): Pt will complete 3 of 5 grooming tasks with min cues to sequence OT Short Term Goal 4 (Week 1): Pt will demo improved attention to left as evidenced by ability to locate supplies needed for BADL with min vc OT Short Term Goal 5 (Week 1): Pt will bathe seated at sink with mod assist to maintain supported standing while washing buttocks.  Skilled Therapeutic Interventions/Progress Updates:    Pt engaged in BADL retraining including bathing at shower level and dressing with sit<>stand from w/c at sink.  Focus on increased controlled use of [name] for all functional tasks, standing balance, functional transfers, hemi dressing techniques and safety awareness.  Pt continues to exhibit slight impulsivity with transitional movements.  Pt initiates use of [name] in all functional tasks.  Pt performs squat pivot transfers with mod A and mod verbal cues for sequencing and safety.   Therapy Documentation Precautions:  Precautions Precautions: Fall Precaution Comments: [name] Restrictions Weight Bearing Restrictions: No Pain: Pain Assessment Pain Assessment: No/denies pain ADL: ADL ADL Comments: see Functional Tool  See Function Navigator for Current Functional Status.   Therapy/Group: Individual Therapy  Leroy Libman 11/29/2015, 10:33 AM
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
## 2015-11-29 NOTE — Progress Notes (Addendum)
Subjective/Complaints: Pt seen this AM.  He states he slept much better overnight.  He does note epistaxis for the last few days and dry nose.   ROS: Denies CP,SOB, N/V/D   Objective: Vital Signs: Blood pressure 190/100, pulse 95, temperature 98.1 F (36.7 C), temperature source Oral, resp. rate 18, SpO2 100 %. No results found. No results found for this or any previous visit (from the past 72 hour(s)).  BP 190/100 mmHg  Pulse 95  Temp(Src) 98.1 F (36.7 C) (Oral)  Resp 18  SpO2 100% Gen NAD. Vital signs reviewed  HEENT:  Normocephalic, atraumatic Cardio: RRR and no murmur Resp: CTA B/L and unlabored GI: BS positive and NT, ND Musc/Skel:  No tenderness.  No Edema Neuro: Alert and Oriented Sensation diminished to light touch left upper and left lower extremity Motor: [name]: Shoulder abduction 2/5, elbow flexion/extension 3+/5, finger grip 4/5 [name]: 0/5 RUE/RLE: 5/5 Skin:   Intact. Warm and dry.  Assessment/Plan: 1. Functional deficits secondary to Right ACA infarct with Left hemiparesis  which require 3+ hours per day of interdisciplinary therapy in a comprehensive inpatient rehab setting. Physiatrist is providing close team supervision and 24 hour management of active medical problems listed below. Physiatrist and rehab team continue to assess barriers to discharge/monitor patient progress toward functional and medical goals. FIM: Function - Bathing Position: Shower Body parts bathed by patient: Right arm, Left arm, Chest, Abdomen, Front perineal area, Right upper leg, Left upper leg Body parts bathed by helper: Buttocks, Right lower leg, Left lower leg Bathing not applicable: Back Assist Level: Touching or steadying assistance(Pt > 75%)  Function- Upper Body Dressing/Undressing What is the patient wearing?: Pull over shirt/dress Pull over shirt/dress - Perfomed by patient: Thread/unthread right sleeve, Thread/unthread left sleeve, Put head through opening Pull over  shirt/dress - Perfomed by helper: Pull shirt over trunk Assist Level: Touching or steadying assistance(Pt > 75%) Function - Lower Body Dressing/Undressing What is the patient wearing?: Pants, Socks, Shoes Position: Wheelchair/chair at sink Pants- Performed by patient: Thread/unthread right pants leg Pants- Performed by helper: Thread/unthread left pants leg, Pull pants up/down Non-skid slipper socks- Performed by helper: Don/doff right sock, Don/doff left sock Socks - Performed by helper: Don/doff right sock, Don/doff left sock Shoes - Performed by helper: Don/doff right shoe, Don/doff left shoe, Fasten right, Fasten left Assist for footwear: Dependant Assist for lower body dressing: Touching or steadying assistance (Pt > 75%)  Function - Toileting Toileting activity did not occur: Refused Toileting steps completed by patient: Adjust clothing prior to toileting, Performs perineal hygiene, Adjust clothing after toileting Toileting steps completed by helper: Adjust clothing after toileting, Performs perineal hygiene, Adjust clothing prior to toileting Toileting Assistive Devices: Grab bar or rail Assist level: Touching or steadying assistance (Pt.75%)  Function - Air cabin crew transfer activity did not occur: Refused Toilet transfer assistive device: Facilities manager lift: Stedy Assist level to toilet: Touching or steadying assistance (Pt > 75%) Assist level from toilet: Touching or steadying assistance (Pt > 75%)  Function - Chair/bed transfer Chair/bed transfer method: Squat pivot Chair/bed transfer assist level: 2 helpers Chair/bed transfer assistive device: Armrests Chair/bed transfer details: Verbal cues for precautions/safety, Verbal cues for technique  Function - Locomotion: Wheelchair Will patient use wheelchair at discharge?: Yes Type: Manual Max wheelchair distance: 150 Assist Level: Touching or steadying assistance (Pt > 75%) Assist Level: Touching  or steadying assistance (Pt > 75%) Assist Level: Touching or steadying assistance (Pt > 75%) Turns around,maneuvers to table,bed, and toilet,negotiates  3% grade,maneuvers on rugs and over doorsills: No Function - Locomotion: Ambulation Assistive device: Rail in hallway Max distance: 25 Assist level: 2 helpers Walk 10 feet activity did not occur: Safety/medical concerns Assist level: 2 helpers Walk 50 feet with 2 turns activity did not occur: Safety/medical concerns Walk 150 feet activity did not occur: Safety/medical concerns Walk 10 feet on uneven surfaces activity did not occur: Safety/medical concerns  Function - Comprehension Comprehension: Auditory Comprehension assist level: Understands basic 90% of the time/cues < 10% of the time  Function - Expression Expression: Verbal Expression assist level: Expresses basic 90% of the time/requires cueing < 10% of the time.  Function - Social Interaction Social Interaction assist level: Interacts appropriately 90% of the time - Needs monitoring or encouragement for participation or interaction.  Function - Problem Solving Problem solving assist level: Solves basic 50 - 74% of the time/requires cueing 25 - 49% of the time  Function - Memory Memory assist level: Recognizes or recalls 50 - 74% of the time/requires cueing 25 - 49% of the time Patient normally able to recall (first 3 days only): Current season, Location of own room, Staff names and faces, That he or she is in a hospital  Medical Problem List and Plan: 1.  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  DVT Prophylaxis/Anticoagulation: Mechanical: Sequential compression devices, below knee Bilateral lower extremities 3. Pain Management:  hydrocodone prn for pain 4. Mood: LCSW to follow for evaluation and support.      5. Neuropsych: This patient is not fully capable of making decisions on his own behalf. 6. Skin/Wound Care: Routine pressure  relief measures. Maintain adequate nutrition and hydration status.   7. Fluids/Electrolytes/Nutrition: Monitor I/O. Appetite remains good.  8.  HTN:   Cont 10mg   Norvasc 10 daily, catapres 0.3 patch.   HCTZ changed to chlorthalidone on 12/21  Catapres prn additionally for SBP > 180 or DBP >105 as per Neurology parameters.  Renal ultrasound ordered  Will consult Nephro as well  Will continue to monitor  9. Dyslipidemia: On Lipitor.   10. H/o depression with anxiety: Used to see mental health in the past. Has been off meds for years.   low dose xanax started 11. AKI: Cr. 1.52 on 12/18.  Will encourage fluid intake and consider IVF if necessary.  Will recheck labs periodically. 12. Epistaxes  Will order humidifier   LOS (Days) 6 A FACE TO FACE EVALUATION WAS PERFORMED  [name] 11/29/2015, 9:15 AM

## 2015-11-29 NOTE — Progress Notes (Signed)
Patient noted with high blood pressures throughout day. See flowsheet. Blood pressure decreased around 1410 for 143/86. Patient reports feeling better. Continue with plan of care .  Mliss Sax

## 2015-11-29 NOTE — Progress Notes (Signed)
Physical Therapy Session Note  Patient Details  Name: Taylor Obrien MRN: [id number] Date of Birth: 03-09-1966  Today's Date: 11/29/2015 PT Individual Time:0800  - 0900, 60 min    Short Term Goals: Week 1:  PT Short Term Goal 1 (Week 1): Pt will be able to demonstrate static sitting balance at midline with mod assist x 5 min PT Short Term Goal 2 (Week 1): Pt will be able to perform basic transfers on level surfaces with max assist of 1 PT Short Term Goal 3 (Week 1): Pt will be able to attend to [name] and [name] during transfers 50% of the time PT Short Term Goal 4 (Week 1): Pt will be able to propel w/c with hemitechnique x 50' with min assist  Skilled Therapeutic Interventions/Progress Updates: wife observed and assisted when requested.  W/c> mat to R with +2 assist; impulsive and inattentive to L side.  neuromuscular re-education via demo, VCS, visual feedback for [name] grasp and release, trunk shortening/lengthening/rotating during reaching L/R out of BOS laterally and upward; pelvic dissociation during reciprocal scooting; midline orientation during static sitting and A-P during reaching; R and L lateral leans; L stance stability during standing with bil UE support. flex/extension.    Slide board transfer mat> w/c to L with mod/max assist.    Pt returned to room and left resting in w/c, with all needs in place and wife in room.  Quick release belt donned.  Therapy Documentation Precautions:  Precautions Precautions: Fall Precaution Comments: [name] Restrictions Weight Bearing Restrictions: No   Vital Signs: Therapy Vitals Pulse Rate:95 BP: (!) 170/16mmHg Patient Position (if appropriate): sitting Pain: Pain Assessment Pain Assessment: No/denies pain     See Function Navigator for Current Functional Status.   Therapy/Group: Individual Therapy  Chancey Ringel 11/29/2015, 4:38 PM

## 2015-11-29 NOTE — Progress Notes (Signed)
Speech Language Pathology Daily Session Note  Patient Details  Name: Taylor Obrien MRN: [id number] Date of Birth: 11-30-1966  Today's Date: 11/29/2015 SLP Individual Time: [id number] SLP Individual Time Calculation (min): 32 min  Short Term Goals: Week 1: SLP Short Term Goal 1 (Week 1): Pt will locate call bell and demonstrate appropriate use with min A verbal cues in 75% of observable opportunities. SLP Short Term Goal 2 (Week 1): pt will demonstrate functional problem solving for basic and familiar tasks with min verbal cues.  SLP Short Term Goal 3 (Week 1): Pt will utilize external aids to recall daily, functional information with min A verbal and visual cues.  SLP Short Term Goal 4 (Week 1): Patient will demonstrate selective attention in a mildly distracting enviornment for 15 minutes with Min A verbal cues for redirection.  SLP Short Term Goal 5 (Week 1): Patient will attend to the left field of enviornment/[name] during functional tasks with Min A verbal cues.  SLP Short Term Goal 6 (Week 1): Patient will consume current diet with minimal overt s/s of aspiration with Mod I for use of swallowing compensatory strategies.   Skilled Therapeutic Interventions: Skilled treatment session focused on cognitive goals. Upon arrival, patient was awake while sitting upright in the wheelchair. Patient independently requested to use the bathroom and required Min A verbal cues for safety with transfer via the [facility] and for attention to [name]. SLP also facilitated the session by providing Min-Mod A question cues for functional problem solving and recall during a new learning, novel task. Patient independently turned off his television to decrease distractions and maximize selective attention to task. Patient left upright in wheelchair with all needs within reach. Continue with current plan of care.    Function:  Cognition Comprehension Comprehension assist level: Understands basic 90% of the time/cues < 10%  of the time  Expression   Expression assist level: Expresses basic 90% of the time/requires cueing < 10% of the time.  Social Interaction Social Interaction assist level: Interacts appropriately 90% of the time - Needs monitoring or encouragement for participation or interaction.  Problem Solving Problem solving assist level: Solves basic 50 - 74% of the time/requires cueing 25 - 49% of the time  Memory Memory assist level: Recognizes or recalls 50 - 74% of the time/requires cueing 25 - 49% of the time    Pain Pain Assessment Pain Assessment: No/denies pain  Therapy/Group: Individual Therapy  Saahas Hidrogo 11/29/2015, 4:49 PM

## 2015-11-29 NOTE — Progress Notes (Signed)
Speech Language Pathology Daily Session Note  Patient Details  Name: Taylor Obrien MRN: [id number] Date of Birth: May 14, 1966  Today's Date: 11/29/2015 SLP Individual Time: 1105-1150 SLP Individual Time Calculation (min): 45 min  Short Term Goals: Week 1: SLP Short Term Goal 1 (Week 1): Pt will locate call bell and demonstrate appropriate use with min A verbal cues in 75% of observable opportunities. SLP Short Term Goal 2 (Week 1): pt will demonstrate functional problem solving for basic and familiar tasks with min verbal cues.  SLP Short Term Goal 3 (Week 1): Pt will utilize external aids to recall daily, functional information with min A verbal and visual cues.  SLP Short Term Goal 4 (Week 1): Patient will demonstrate selective attention in a mildly distracting enviornment for 15 minutes with Min A verbal cues for redirection.  SLP Short Term Goal 5 (Week 1): Patient will attend to the left field of enviornment/[name] during functional tasks with Min A verbal cues.  SLP Short Term Goal 6 (Week 1): Patient will consume current diet with minimal overt s/s of aspiration with Mod I for use of swallowing compensatory strategies.   Skilled Therapeutic Interventions:  Pt was seen for skilled ST targeting cognitive goals.  Per RN, pt with elevated BPs today and PA requested bedside therapies today.  SLP facilitated the session with a novel card game targeting functional problem solving.  Pt required mod assist verbal cues for impulsivity and working memory of task protocols and procedures.  SLP attempted a catalog shopping task; however, MD arrived from nephrology for consult regarding hypertension.  As a result, pt missed 15 minutes of skilled ST.  Pt left in bed with wife and MD at bedside.  Continue per current plan of care.    Function:  Eating Eating                 Cognition Comprehension Comprehension assist level: Understands basic 90% of the time/cues < 10% of the time  Expression    Expression assist level: Expresses basic 90% of the time/requires cueing < 10% of the time.  Social Interaction Social Interaction assist level: Interacts appropriately 90% of the time - Needs monitoring or encouragement for participation or interaction.  Problem Solving Problem solving assist level: Solves basic 50 - 74% of the time/requires cueing 25 - 49% of the time  Memory Memory assist level: Recognizes or recalls 50 - 74% of the time/requires cueing 25 - 49% of the time    Pain Pain Assessment Pain Assessment: No/denies pain  Therapy/Group: Individual Therapy  Taylor Obrien, [name] 11/29/2015, 1:06 PM

## 2015-11-29 NOTE — Patient Care Conference (Signed)
Inpatient RehabilitationTeam Conference and Plan of Care Update Date: 11/29/2015   Time: 10:40 AM    Patient Name: Taylor Obrien      Medical Record Number: [id number]  Date of Birth: 1966/11/15 Sex: Male         Room/Bed: 4M05C/4M05C-01 Payor Info: Payor: MEDICAID [place] / Plan: MEDICAID [place] ACCESS / Product Type: *No Product type* /    Admitting Diagnosis: r cva  Admit Date/Time:  11/23/2015  6:43 PM Admission Comments: No comment available   Primary Diagnosis:  Hemiplegia and hemiparesis following unspecified cerebrovascular disease affecting left non-dominant side (HCC) Principal Problem: Hemiplegia and hemiparesis following unspecified cerebrovascular disease affecting left non-dominant side [facility])  Patient Active Problem List   Diagnosis Date Noted  . OSA (obstructive sleep apnea)   . Hypokalemia   . Elevated blood pressure   . Hemiparesis affecting left side as late effect of stroke ([place])   . Epistaxis   . HTN (hypertension) 11/27/2015  . AKI (acute kidney injury) ([place]) 11/27/2015  . Hemiplegia and hemiparesis following unspecified cerebrovascular disease affecting left non-dominant side ([place]) 11/23/2015  . Gait disturbance, post-stroke 11/23/2015  . Stroke due to occlusion of right anterior cerebral artery ([place]) 11/23/2015  . Cerebrovascular accident (CVA) due to occlusion of right anterior cerebral artery ([place])   . Essential hypertension   . Depression   . Chronic pain syndrome   . ETOH abuse   . Marijuana abuse   . Cerebrovascular accident (CVA) due to thrombosis of right carotid artery ([place])   . Malignant hypertension   . Hyperlipidemia   . CVA (cerebral infarction) 11/18/2015  . Stroke (cerebrum) ([place]) 11/18/2015  . Chest pain 10/09/2012  . GERD (gastroesophageal reflux disease) 10/09/2012  . Chronic back pain 10/09/2012  . Tobacco abuse 10/09/2012  . Hypertensive heart disease 08/31/2012  . Accelerated hypertension 08/31/2012  . Precordial pain 08/31/2012     Expected Discharge Date: Expected Discharge Date: 12/15/15  Team Members Present: Physician leading conference: Dr. Delice Lesch Social Worker Present: Ovidio Kin, LCSW Nurse Present: Dorien Chihuahua, RN PT Present: Georjean Mode, PT OT Present: Willeen Cass, OT SLP Present: Windell Moulding, SLP PPS Coordinator present : Daiva Nakayama, RN, CRRN     Current Status/Progress Goal Weekly Team Focus  Medical   Left Hemiparesis secondary to Right ACA infarct with hx of noncompliance  Improve BP, renal function, epistaxis, family edu  see above   Bowel/Bladder   Continent of bowel and bladder. LBM 11/27/15  Pt to remain continent of bowel and bladder  Monitor   Swallow/Nutrition/ Hydration     na        ADL's             Mobility   Supervision-min A w/c mobility, mod-max A squat pivots; gait/standing +2 but limited due to continued elevated BP above parameters  supervision bed mobility and w/c mobility, min A transfers, mod A standing and gait  NMR for [name] and [name], motor control/coordination, safety with transfers, family ed with wife for toilet transfers with [facility]   Communication     na        Safety/Cognition/ Behavioral Observations  mod assist for basic tasks due to impulsivity, left inattention, poor awareness of deficits, decreased sustained attention to tasks  min assist   safety awareness, awareness of deficits, impulsivity, problem solving    Pain   Vicodin 1-2tabs q 4hrs for headache discomfort  <3  Monitor for nonverbal signs of pain   Skin  R groin incision healing appropriately  No additional skin breakdown  Monitor q shift. Encourage turn q 2hrs      *See Care Plan and progress notes for long and short-term goals.  Barriers to Discharge: Uncontrolled BP, elevated Cr, safety awareness    Possible Resolutions to Barriers:  Optimize BP meds, Nephro consulted, pt and family edu    Discharge Planning/Teaching Needs:  Wife to be his caregiver recently laided off  from job. Neuro-psych seeing weekly for support. Wife here to observe in therpaies      Team Discussion:  Goals-min/mod level of assist-neuro-psych seeing for support. MD adjusting BP medicines. When high it limits his therapies. Chainging diet to Low sodium 2 g. Having renal ultrasound and Neph MD consult. Wife to be his caregiver  Revisions to Treatment Plan:  None   Continued Need for Acute Rehabilitation Level of Care: The patient requires daily medical management by a physician with specialized training in physical medicine and rehabilitation for the following conditions: Daily direction of a multidisciplinary physical rehabilitation program to ensure safe treatment while eliciting the highest outcome that is of practical value to the patient.: Yes Daily medical management of patient stability for increased activity during participation in an intensive rehabilitation regime.: Yes Daily analysis of laboratory values and/or radiology reports with any subsequent need for medication adjustment of medical intervention for : Neurological problems;Other  Jarrod Bodkins, Gardiner Rhyme 11/30/2015, 1:56 PM

## 2015-11-30 ENCOUNTER — Inpatient Hospital Stay (HOSPITAL_COMMUNITY): Payer: Medicaid Other | Admitting: Speech Pathology

## 2015-11-30 ENCOUNTER — Inpatient Hospital Stay (HOSPITAL_COMMUNITY): Payer: Medicaid Other | Admitting: Occupational Therapy

## 2015-11-30 ENCOUNTER — Inpatient Hospital Stay (HOSPITAL_COMMUNITY): Payer: Medicaid Other

## 2015-11-30 DIAGNOSIS — G4733 Obstructive sleep apnea (adult) (pediatric): Secondary | ICD-10-CM | POA: Insufficient documentation

## 2015-11-30 DIAGNOSIS — E876 Hypokalemia: Secondary | ICD-10-CM | POA: Insufficient documentation

## 2015-11-30 LAB — RENAL FUNCTION PANEL
ALBUMIN: 2.7 g/dL — AB (ref 3.5–5.0)
Anion gap: 12 (ref 5–15)
BUN: 22 mg/dL — AB (ref 6–20)
CALCIUM: 9.3 mg/dL (ref 8.9–10.3)
CO2: 27 mmol/L (ref 22–32)
Chloride: 95 mmol/L — ABNORMAL LOW (ref 101–111)
Creatinine, Ser: 1.29 mg/dL — ABNORMAL HIGH (ref 0.61–1.24)
GFR calc Af Amer: >60 mL/min (ref >60)
GFR calc non Af Amer: >60 mL/min (ref >60)
GLUCOSE: 95 mg/dL (ref 65–99)
PHOSPHORUS: 6.2 mg/dL — AB (ref 2.5–4.6)
Potassium: 3.1 mmol/L — ABNORMAL LOW (ref 3.5–5.1)
SODIUM: 134 mmol/L — AB (ref 135–145)

## 2015-11-30 LAB — PROTEIN ELECTROPHORESIS, SERUM
A/G RATIO SPE: 0.9 (ref 0.7–1.7)
ALPHA-1-GLOBULIN: 0.3 g/dL (ref 0.0–0.4)
Albumin ELP: 3 g/dL (ref 2.9–4.4)
Alpha-2-Globulin: 1.2 g/dL — ABNORMAL HIGH (ref 0.4–1.0)
Beta Globulin: 1.1 g/dL (ref 0.7–1.3)
GLOBULIN, TOTAL: 3.5 g/dL (ref 2.2–3.9)
Gamma Globulin: 1 g/dL (ref 0.4–1.8)
TOTAL PROTEIN ELP: 6.5 g/dL (ref 6.0–8.5)

## 2015-11-30 MED ORDER — POTASSIUM CHLORIDE CRYS ER 20 MEQ PO TBCR
20.0000 meq | EXTENDED_RELEASE_TABLET | Freq: Once | ORAL | Status: AC
  Administered 2015-11-30: 20 meq via ORAL
  Filled 2015-11-30: qty 1

## 2015-11-30 MED ORDER — TRAZODONE HCL 50 MG PO TABS
100.0000 mg | ORAL_TABLET | Freq: Every day | ORAL | Status: DC
  Administered 2015-11-30 – 2015-12-19 (×20): 100 mg via ORAL
  Filled 2015-11-30 (×20): qty 2

## 2015-11-30 NOTE — Progress Notes (Signed)
Occupational Therapy Session Note  Patient Details  Name: Taylor Obrien MRN: [id number] Date of Birth: [birth year]  Today's Date: 11/30/2015 OT Individual Time: 1300-1414 OT Individual Time Calculation (min): 74 min    Short Term Goals: Week 1:  OT Short Term Goal 1 (Week 1): Pt will complete upper body dressing sitting at EOB with mod assist OT Short Term Goal 2 (Week 1): Pt will transfer from w/c to drop-arm commode with max assist OT Short Term Goal 3 (Week 1): Pt will complete 3 of 5 grooming tasks with min cues to sequence OT Short Term Goal 4 (Week 1): Pt will demo improved attention to left as evidenced by ability to locate supplies needed for BADL with min vc OT Short Term Goal 5 (Week 1): Pt will bathe seated at sink with mod assist to maintain supported standing while washing buttocks.  Skilled Therapeutic Interventions/Progress Updates:  Upon entering the room, pt seated in wheelchair with no c/o pain this session. OT transferred pt onto toilet with mod A. Pt required assistance for clothing management and hygiene. Pt returning to wheelchair and requesting to wash this session. Mod A for stand pivot into walk in shower from wheelchair <>TTB with use of grab bars. Pt showing good safety awareness in shower but notifying therapist when he wished to stand. Pt dressing with sit<>stand at sink side. Pt requiring mod assist for sit <>stand with therapist blocking L knee in standing. Pt utilizing mirror for visual feedback to correct body posture. OT encouraged use of L UE for functional tasks. Once dressed, pt seated in wheelchair with quick release belt donned and call bell within reach upon exiting the room.   Therapy Documentation Precautions:  Precautions Precautions: Fall Precaution Comments: [name]; watch BP; "alien" L UE Restrictions Weight Bearing Restrictions: No Vital Signs: Therapy Vitals Temp: 98.8 F (37.1 C) Pulse Rate: 86 Resp: 18 BP: 102/88 mmHg Patient  Position (if appropriate): Sitting Oxygen Therapy SpO2: 100 % O2 Device: Not Delivered ADL: ADL ADL Comments: see Functional Tool  See Function Navigator for Current Functional Status.   Therapy/Group: Individual Therapy  Phineas Semen 11/30/2015, 4:31 PM

## 2015-11-30 NOTE — Progress Notes (Signed)
Social Work Patient ID: Taylor Obrien, male   DOB: [birth year], 49 y.o.   MRN: [id number] Met with pt and spoke with wife via telephone to discuss team conference goals-supervision/mod assist with ambulation and target discharge date 1/6. He is pleased with his progress and is hopeful he will make more progress and not be so much care for his wife. She has been here and observed pt in therapies. She will do whatever she needs to do for him, she is very committed to him. Will work on obtaining a PCP for pt and discharge plans.

## 2015-11-30 NOTE — Plan of Care (Signed)
Problem: Food- and Nutrition-Related Knowledge Deficit (NB-1.1) Goal: Nutrition education Formal process to instruct or train a patient/client in a skill or to impart knowledge to help patients/clients voluntarily manage or modify food choices and eating behavior to maintain or improve health. Outcome: Completed/Met Date Met:  11/30/15 Nutrition Education Note  RD consulted for nutrition education regarding a low sodium diet.  RD provided "Low Sodium Nutrition Therapy" handout from the Academy of [employer]etics. Reviewed patient's dietary recall. Provided examples on ways to decrease sodium intake in diet. Discouraged intake of processed foods and use of salt shaker. Encouraged fresh fruits and vegetables as well as whole grain sources of carbohydrates to maximize fiber intake. RD discussed why it is important for patient to adhere to diet recommendations. Teach back method used.  Expect good compliance.  Current diet order is 2 gram sodium diet, patient is consuming approximately 100% of meals at this time. Labs and medications reviewed. No further nutrition interventions warranted at this time. RD contact information provided. If additional nutrition issues arise, please re-consult RD.   Corrin Parker, MS, RD, LDN Pager # [phone] After hours/ weekend pager # [phone]

## 2015-11-30 NOTE — Progress Notes (Addendum)
Subjective/Complaints: Pt seen this AM resting in bed.  He states he slept well overnight and feels better this AM.  He did not have a H/a or nose bleed yesterday.  .   ROS: Denies CP,SOB, N/V/D   Objective: Vital Signs: Blood pressure 163/102, pulse 66, temperature 97.7 F (36.5 C), temperature source Oral, resp. rate 16, SpO2 100 %. No results found. Results for orders placed or performed during the hospital encounter of 11/23/15 (from the past 72 hour(s))  Urinalysis, Routine w reflex microscopic (not at [facility])     Status: Abnormal   Collection Time: 11/29/15  3:22 PM  Result Value Ref Range   Color, Urine YELLOW YELLOW   APPearance CLEAR CLEAR   Specific Gravity, Urine 1.015 1.005 - 1.030   pH 5.0 5.0 - 8.0   Glucose, UA NEGATIVE NEGATIVE mg/dL   Hgb urine dipstick NEGATIVE NEGATIVE   Bilirubin Urine NEGATIVE NEGATIVE   Ketones, ur NEGATIVE NEGATIVE mg/dL   Protein, ur 100 (A) NEGATIVE mg/dL   Nitrite NEGATIVE NEGATIVE   Leukocytes, UA NEGATIVE NEGATIVE  Protein / creatinine ratio, urine     Status: Abnormal   Collection Time: 11/29/15  3:22 PM  Result Value Ref Range   Creatinine, Urine 90.83 mg/dL   Total Protein, Urine 100 mg/dL    Comment: NO NORMAL RANGE ESTABLISHED FOR THIS TEST   Protein Creatinine Ratio 1.10 (H) 0.00 - 0.15 mg/mg[Cre]  Urine microscopic-add on     Status: Abnormal   Collection Time: 11/29/15  3:22 PM  Result Value Ref Range   Squamous Epithelial / LPF 0-5 (A) NONE SEEN   WBC, UA 0-5 0 - 5 WBC/hpf   RBC / HPF NONE SEEN 0 - 5 RBC/hpf   Bacteria, UA RARE (A) NONE SEEN   Casts GRANULAR CAST (A) NEGATIVE  Renal function panel     Status: Abnormal   Collection Time: 11/30/15  4:20 AM  Result Value Ref Range   Sodium 134 (L) 135 - 145 mmol/L   Potassium 3.1 (L) 3.5 - 5.1 mmol/L   Chloride 95 (L) 101 - 111 mmol/L   CO2 27 22 - 32 mmol/L   Glucose, Bld 95 65 - 99 mg/dL   BUN 22 (H) 6 - 20 mg/dL   Creatinine, Ser 1.29 (H) 0.61 - 1.24 mg/dL   Calcium  9.3 8.9 - 10.3 mg/dL   Phosphorus 6.2 (H) 2.5 - 4.6 mg/dL   Albumin 2.7 (L) 3.5 - 5.0 g/dL   GFR calc non Af Amer >60 >60 mL/min   GFR calc Af Amer >60 >60 mL/min    Comment: (NOTE) The eGFR has been calculated using the CKD EPI equation. This calculation has not been validated in all clinical situations. eGFR's persistently <60 mL/min signify possible Chronic Kidney Disease.    Anion gap 12 5 - 15    BP 163/102 mmHg  Pulse 66  Temp(Src) 97.7 F (36.5 C) (Oral)  Resp 16  SpO2 100% Gen NAD. Vital signs reviewed  HEENT:  Normocephalic, atraumatic Cardio: RRR and no murmur Resp: CTA B/L and unlabored GI: BS positive and NT, ND Musc/Skel:  No tenderness.  No Edema Neuro: Alert and Oriented Sensation diminished to light touch left upper and left lower extremity Motor: [name]: Shoulder abduction 2/5, elbow flexion/extension 3+/5, finger grip 4/5 [name]: 0/5 RUE/RLE: 5/5 Skin:   Intact. Warm and dry.  Assessment/Plan: 1. Functional deficits secondary to Right ACA infarct with Left hemiparesis  which require 3+ hours per day of  interdisciplinary therapy in a comprehensive inpatient rehab setting. Physiatrist is providing close team supervision and 24 hour management of active medical problems listed below. Physiatrist and rehab team continue to assess barriers to discharge/monitor patient progress toward functional and medical goals. FIM: Function - Bathing Position: Shower Body parts bathed by patient: Right arm, Left arm, Chest, Abdomen, Front perineal area, Right upper leg, Left upper leg Body parts bathed by helper: Buttocks, Right lower leg, Left lower leg Bathing not applicable: Back Assist Level: Touching or steadying assistance(Pt > 75%)  Function- Upper Body Dressing/Undressing What is the patient wearing?: Pull over shirt/dress Pull over shirt/dress - Perfomed by patient: Thread/unthread right sleeve, Thread/unthread left sleeve, Put head through opening, Pull shirt over  trunk Pull over shirt/dress - Perfomed by helper: Pull shirt over trunk Assist Level: Supervision or verbal cues Function - Lower Body Dressing/Undressing What is the patient wearing?: Pants, Non-skid slipper socks Position: Wheelchair/chair at sink Pants- Performed by patient: Thread/unthread right pants leg Pants- Performed by helper: Thread/unthread left pants leg, Pull pants up/down Non-skid slipper socks- Performed by helper: Don/doff right sock, Don/doff left sock Socks - Performed by helper: Don/doff right sock, Don/doff left sock Shoes - Performed by helper: Don/doff right shoe, Don/doff left shoe, Fasten right, Fasten left Assist for footwear: Dependant Assist for lower body dressing: Touching or steadying assistance (Pt > 75%)  Function - Toileting Toileting activity did not occur: Refused Toileting steps completed by patient: Adjust clothing prior to toileting, Performs perineal hygiene, Adjust clothing after toileting Toileting steps completed by helper: Adjust clothing after toileting, Performs perineal hygiene, Adjust clothing prior to toileting Toileting Assistive Devices: Grab bar or rail Assist level: Touching or steadying assistance (Pt.75%)  Function - Air cabin crew transfer activity did not occur: Refused Toilet transfer assistive device: Facilities manager lift: Stedy Assist level to toilet: Touching or steadying assistance (Pt > 75%) Assist level from toilet: Touching or steadying assistance (Pt > 75%)  Function - Chair/bed transfer Chair/bed transfer method: Squat pivot Chair/bed transfer assist level: 2 helpers Chair/bed transfer assistive device: Armrests Chair/bed transfer details: Verbal cues for precautions/safety, Verbal cues for technique  Function - Locomotion: Wheelchair Will patient use wheelchair at discharge?: Yes Type: Manual Max wheelchair distance: 150 Assist Level: Touching or steadying assistance (Pt > 75%) Assist Level:  Touching or steadying assistance (Pt > 75%) Assist Level: Touching or steadying assistance (Pt > 75%) Turns around,maneuvers to table,bed, and toilet,negotiates 3% grade,maneuvers on rugs and over doorsills: No Function - Locomotion: Ambulation Assistive device: Rail in hallway Max distance: 25 Assist level: 2 helpers Walk 10 feet activity did not occur: Safety/medical concerns Assist level: 2 helpers Walk 50 feet with 2 turns activity did not occur: Safety/medical concerns Walk 150 feet activity did not occur: Safety/medical concerns Walk 10 feet on uneven surfaces activity did not occur: Safety/medical concerns  Function - Comprehension Comprehension: Auditory Comprehension assist level: Understands basic 90% of the time/cues < 10% of the time  Function - Expression Expression: Verbal Expression assist level: Expresses basic 90% of the time/requires cueing < 10% of the time.  Function - Social Interaction Social Interaction assist level: Interacts appropriately 90% of the time - Needs monitoring or encouragement for participation or interaction.  Function - Problem Solving Problem solving assist level: Solves basic 50 - 74% of the time/requires cueing 25 - 49% of the time  Function - Memory Memory assist level: Recognizes or recalls 50 - 74% of the time/requires cueing 25 - 49% of the  time Patient normally able to recall (first 3 days only): Current season, Location of own room, Staff names and faces, That he or she is in a hospital  Medical Problem List and Plan: 1.  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  DVT Prophylaxis/Anticoagulation: Mechanical: Sequential compression devices, below knee Bilateral lower extremities 3. Pain Management:  hydrocodone prn for pain 4. Mood: LCSW to follow for evaluation and support.      5. Neuropsych: This patient is not fully capable of making decisions on his own behalf. 6. Skin/Wound Care: Routine  pressure relief measures. Maintain adequate nutrition and hydration status.   7. Fluids/Electrolytes/Nutrition: Monitor I/O. Appetite remains good.   Hypokalemia: 3.1 on 12/22, replaced 8.  HTN:   HCTZ changed to chlorthalidone on 12/21  Catapres prn additionally for SBP > 180 or DBP >105 as per Neurology parameters.  Renal ultrasound ordered, pending  Nephro consulted, appreciate recs, Labetolol increased to 200  Will continue to monitor and follow recs per Nephro 9. Dyslipidemia: On Lipitor.   10. H/o depression with anxiety: Used to see mental health in the past. Has been off meds for years.   low dose xanax started 11. AKI: Cr. 1.29 on 12/22.    Will recheck labs periodically. 12. Epistaxes  Will order humidifier, pending 13. OSA  CPAP ordered  LOS (Days) 7 A FACE TO FACE EVALUATION WAS PERFORMED  Ajla Mcgeachy Lorie Phenix 11/30/2015, 8:52 AM

## 2015-11-30 NOTE — Progress Notes (Signed)
Speech Language Pathology Daily Session Note  Patient Details  Name: Taylor Obrien MRN: [id number] Date of Birth: September 08, 1966  Today's Date: 11/30/2015 SLP Individual Time: 1109-1205 SLP Individual Time Calculation (min): 56 min  Short Term Goals: Week 1: SLP Short Term Goal 1 (Week 1): Pt will locate call bell and demonstrate appropriate use with min A verbal cues in 75% of observable opportunities. SLP Short Term Goal 2 (Week 1): pt will demonstrate functional problem solving for basic and familiar tasks with min verbal cues.  SLP Short Term Goal 3 (Week 1): Pt will utilize external aids to recall daily, functional information with min A verbal and visual cues.  SLP Short Term Goal 4 (Week 1): Patient will demonstrate selective attention in a mildly distracting enviornment for 15 minutes with Min A verbal cues for redirection.  SLP Short Term Goal 5 (Week 1): Patient will attend to the left field of enviornment/[name] during functional tasks with Min A verbal cues.  SLP Short Term Goal 6 (Week 1): Patient will consume current diet with minimal overt s/s of aspiration with Mod I for use of swallowing compensatory strategies.   Skilled Therapeutic Interventions:  Pt was seen for skilled ST targeting cognitive goals.  SLP facilitated the session with a novel visuospatial-constructional problem solving task, during which pt required mod assist verbal cues to plan and execute a problem solving strategy.  Mod assist verbal cues were also needed to recognize and correct errors.  Furthermore, SLP facilitated the session with a novel card game targeting attention to task.  Pt selectively attended to task for 10 minutes with min assist verbal cues for redirection.  Pt was returned to room and left with call bell within reach and quick release belt donned for safety.  Continue per current plan of care.      Function:  Eating Eating                 Cognition Comprehension Comprehension assist  level: Understands basic 90% of the time/cues < 10% of the time  Expression   Expression assist level: Expresses basic 90% of the time/requires cueing < 10% of the time.  Social Interaction Social Interaction assist level: Interacts appropriately 90% of the time - Needs monitoring or encouragement for participation or interaction.  Problem Solving Problem solving assist level: Solves basic 50 - 74% of the time/requires cueing 25 - 49% of the time  Memory Memory assist level: Recognizes or recalls 50 - 74% of the time/requires cueing 25 - 49% of the time    Pain Pain Assessment Pain Assessment: No/denies pain  Therapy/Group: Individual Therapy  Rashidi Loh, [name] 11/30/2015, 6:12 PM

## 2015-11-30 NOTE — Plan of Care (Signed)
Problem: RH PAIN MANAGEMENT Goal: RH STG PAIN MANAGED AT OR BELOW PT'S PAIN GOAL 3 or less  Outcome: Progressing No c/o pain     

## 2015-11-30 NOTE — Progress Notes (Signed)
S: No new CO O:BP 153/88 mmHg  Pulse 87  Temp(Src) 97.7 F (36.5 C) (Oral)  Resp 16  SpO2 100%  Intake/Output Summary (Last 24 hours) at 11/30/15 1053 Last data filed at 11/30/15 1000  Gross per 24 hour  Intake    960 ml  Output   1350 ml  Net   -390 ml   Weight change:  [id number] and alert CVS: RRR Resp:clear Abd:+ BS NTND Ext:no edema NEURO:CNI Ox3 Decreased strength lt leg >> lt arm   . amLODipine  10 mg Oral Daily  . aspirin  325 mg Oral Daily  . atorvastatin  40 mg Oral q1800  . chlorthalidone  50 mg Oral Daily  . cloNIDine  0.3 mg Transdermal Weekly  . folic acid  1 mg Oral Daily  . labetalol  200 mg Oral TID  . multivitamin with minerals  1 tablet Oral Daily  . nicotine  14 mg Transdermal Daily  . pantoprazole  40 mg Oral QHS  . thiamine  100 mg Oral Daily  . traZODone  100 mg Oral QHS   No results found. BMET    Component Value Date/Time   NA 134* 11/30/2015 0420   K 3.1* 11/30/2015 0420   CL 95* 11/30/2015 0420   CO2 27 11/30/2015 0420   GLUCOSE 95 11/30/2015 0420   BUN 22* 11/30/2015 0420   CREATININE 1.29* 11/30/2015 0420   CALCIUM 9.3 11/30/2015 0420   GFRNONAA >60 11/30/2015 0420   GFRAA >60 11/30/2015 0420   CBC    Component Value Date/Time   WBC 7.4 11/18/2015 1100   RBC 5.04 11/18/2015 1100   HGB 18.4* 11/18/2015 1111   HCT 54.0* 11/18/2015 1111   PLT 172 11/18/2015 1100   MCV 96.6 11/18/2015 1100   MCH 34.5* 11/18/2015 1100   MCHC 35.7 11/18/2015 1100   RDW 13.1 11/18/2015 1100   LYMPHSABS 1.4 11/18/2015 1100   MONOABS 0.5 11/18/2015 1100   EOSABS 0.1 11/18/2015 1100   BASOSABS 0.0 11/18/2015 1100     Assessment: 1. HTN, under better control.  Goal SBP 130-140 for now 2. CKD 2/3 sec HTN 3. SP stroke 4. Sleep apnea 5 ETOH abuse 6. Hypokalemia Plan: 1. Treating his sleep apnea will help with BP control when out of hospital. 2. Cont current meds 3.  Renal art doppler pending 4. Replace K   Coutney Wildermuth T

## 2015-11-30 NOTE — Progress Notes (Signed)
Physical Therapy Session Note  Patient Details  Name: Taylor Obrien MRN: [id number] Date of Birth: June 28, 1966  Today's Date: 11/30/2015 PT Individual Time: 0903-1003 PT Individual Time Calculation (min): 60 min   Short Term Goals: Week 1:  PT Short Term Goal 1 (Week 1): Pt will be able to demonstrate static sitting balance at midline with mod assist x 5 min PT Short Term Goal 2 (Week 1): Pt will be able to perform basic transfers on level surfaces with max assist of 1 PT Short Term Goal 3 (Week 1): Pt will be able to attend to [name] and [name] during transfers 50% of the time PT Short Term Goal 4 (Week 1): Pt will be able to propel w/c with hemitechnique x 50' with min assist  Skilled Therapeutic Interventions/Progress Updates:  W/c> NuStep stand pivot with mod/max assist.  neuromuscular re-education via forced use, manual and visual cues for bil LEs at level 3, focusing on coordinated, alternating movement with L hand assisting in pushing [name] into extension; standing in parallel bars, wt shifting L><R and with = wt bearing, working on L stance stability with max assist.  Pt tends to hyperextend L knee, which is painful.  Attempted L hip flexion in sitting with visual floor targets; pt unable to elicit any muscle contracton.    PT returned pt to room, and left him resting in w/c with quick release belt on, talking on phone, with all needs within reach. Therapy Documentation Precautions:  Precautions Precautions: Fall Precaution Comments: [name]; watch BP; "alien" L UE Restrictions Weight Bearing Restrictions: No   Vital Signs: Therapy Vitals Pulse Rate: 87 BP: (!) 153/88 mmHg (after activity) Patient Position (if appropriate): Sitting Pain: Pain Assessment Pain Assessment: No/denies pain     See Function Navigator for Current Functional Status.   Therapy/Group: Individual Therapy  [name] 11/30/2015, 10:24 AM

## 2015-12-01 ENCOUNTER — Inpatient Hospital Stay (HOSPITAL_COMMUNITY): Payer: Medicaid Other | Admitting: Occupational Therapy

## 2015-12-01 ENCOUNTER — Inpatient Hospital Stay (HOSPITAL_COMMUNITY): Payer: Medicaid Other | Admitting: Speech Pathology

## 2015-12-01 ENCOUNTER — Inpatient Hospital Stay (HOSPITAL_COMMUNITY): Payer: Medicaid Other

## 2015-12-01 ENCOUNTER — Inpatient Hospital Stay (HOSPITAL_COMMUNITY): Payer: Medicaid Other | Admitting: Physical Therapy

## 2015-12-01 DIAGNOSIS — R030 Elevated blood-pressure reading, without diagnosis of hypertension: Secondary | ICD-10-CM

## 2015-12-01 LAB — BASIC METABOLIC PANEL
ANION GAP: 12 (ref 5–15)
BUN: 25 mg/dL — AB (ref 6–20)
CHLORIDE: 97 mmol/L — AB (ref 101–111)
CO2: 29 mmol/L (ref 22–32)
Calcium: 9.6 mg/dL (ref 8.9–10.3)
Creatinine, Ser: 1.9 mg/dL — ABNORMAL HIGH (ref 0.61–1.24)
GFR calc Af Amer: 47 mL/min — ABNORMAL LOW (ref >60)
GFR calc non Af Amer: 40 mL/min — ABNORMAL LOW (ref >60)
GLUCOSE: 116 mg/dL — AB (ref 65–99)
POTASSIUM: 4.1 mmol/L (ref 3.5–5.1)
Sodium: 138 mmol/L (ref 135–145)

## 2015-12-01 MED ORDER — BISACODYL 10 MG RE SUPP: 10.0000 mg | Freq: Every day | RECTAL | Status: DC | PRN

## 2015-12-01 MED ORDER — SENNOSIDES-DOCUSATE SODIUM 8.6-50 MG PO TABS
2.0000 | ORAL_TABLET | Freq: Every day | ORAL | Status: DC
  Administered 2015-12-01 – 2015-12-19 (×15): 2 via ORAL
  Filled 2015-12-01 (×18): qty 2

## 2015-12-01 MED ORDER — CHLORTHALIDONE 50 MG PO TABS
50.0000 mg | ORAL_TABLET | Freq: Every day | ORAL | Status: DC
  Administered 2015-12-02 – 2015-12-17 (×16): 50 mg via ORAL
  Filled 2015-12-01 (×17): qty 1

## 2015-12-01 MED ORDER — FLEET ENEMA 7-19 GM/118ML RE ENEM: 1.0000 | ENEMA | Freq: Every day | RECTAL | Status: DC | PRN

## 2015-12-01 MED ORDER — AMLODIPINE BESYLATE 10 MG PO TABS
10.0000 mg | ORAL_TABLET | Freq: Every day | ORAL | Status: DC
  Administered 2015-12-02 – 2015-12-20 (×19): 10 mg via ORAL
  Filled 2015-12-01 (×19): qty 1

## 2015-12-01 NOTE — Progress Notes (Signed)
Renal artery duplex has been completed. Preliminary results = No evidence of renal artery stenosis.   Landry Mellow, RDMS, RVT 12/01/2015

## 2015-12-01 NOTE — Progress Notes (Signed)
Physical Therapy Note  Patient Details  Name: Taylor Obrien MRN: [id number] Date of Birth: 07-Nov-1966 Today's Date: 12/01/2015    Time:1055-1151 56 minutes  1:1 No c/o pain. Standing in parallel bars with max A for L LE control, tactile cues for glute and quad contraction. Standing reaching with R UE with max/total A for balance and L LE control, improves with tactile cues and manual facilitation at trunk and hips and verbal cues for smooth and controlled movements.  Nustep for L LE mm activation x 8 minutes level 2 with tactile cuing.  W/c mobility hemitechnique 50' x 2 with cues for safety.  Pt needs to use urinal, PT assisted with max A to stand and steady with nurse tech assisting with urinal use. Pt left in room with nurse tech present, quick release belt donned, needs at hand.   Benjamyn Hestand 12/01/2015, 11:52 AM

## 2015-12-01 NOTE — Progress Notes (Signed)
Patient tolerating therapy. B/P elevated this pm prior to therapy. 1316 B/P 144/113 per OT. Rechecked at 1350 for 127/102. Scheduled labetalol given and B/P re checked at 1416 for 138/78. Patient remained asymptomatic. Continue with plan of care. Mliss Sax

## 2015-12-01 NOTE — Progress Notes (Addendum)
Speech Language Pathology Weekly Progress and Session Note  Patient Details  Name: Taylor Obrien MRN: [id number] Date of Birth: 07/17/1966  Beginning of progress report period: November 24, 2015  End of progress report period: December 01, 2015  Today's Date: 12/01/2015 SLP Individual Time: 1005-1030; 5188-4166 SLP Individual Time Calculation (min): 25 min; 13 min  Short Term Goals: Week 1: SLP Short Term Goal 1 (Week 1): Pt will locate call bell and demonstrate appropriate use with min A verbal cues in 75% of observable opportunities. SLP Short Term Goal 1 - Progress (Week 1): Met SLP Short Term Goal 2 (Week 1): pt will demonstrate functional problem solving for basic and familiar tasks with min verbal cues.  SLP Short Term Goal 2 - Progress (Week 1): Met SLP Short Term Goal 3 (Week 1): Pt will utilize external aids to recall daily, functional information with min A verbal and visual cues.  SLP Short Term Goal 3 - Progress (Week 1): Other (comment) (not addressed this reporting period, will carry goals into next reporting period ) SLP Short Term Goal 4 (Week 1): Patient will demonstrate selective attention in a mildly distracting enviornment for 15 minutes with Min A verbal cues for redirection.  SLP Short Term Goal 4 - Progress (Week 1): Met SLP Short Term Goal 5 (Week 1): Patient will attend to the left field of enviornment/[name] during functional tasks with Min A verbal cues.  SLP Short Term Goal 5 - Progress (Week 1): Met SLP Short Term Goal 6 (Week 1): Patient will consume current diet with minimal overt s/s of aspiration with Mod I for use of swallowing compensatory strategies.  SLP Short Term Goal 6 - Progress (Week 1): Met    New Short Term Goals: Week 2: SLP Short Term Goal 1 (Week 2): Pt will locate call bell and demonstrate appropriate use with supervision cues in 75% of observable opportunities. SLP Short Term Goal 2 (Week 2): pt will demonstrate functional problem solving for  basic and familiar tasks with supervision cues.  SLP Short Term Goal 3 (Week 2): Pt will utilize external aids to recall daily, functional information with min A verbal and visual cues.  SLP Short Term Goal 4 (Week 2): Patient will demonstrate selective attention in a mildly distracting enviornment for 15 minutes with supervision cues for redirection.  SLP Short Term Goal 5 (Week 2): Patient will attend to the left field of enviornment/[name] during functional tasks with supervision cues.   Weekly Progress Updates:  Pt made functional gains this reporting period and has met 5 out of 5 targeted short term goals.  1 short term goal not addressed due to focus of therapies on problem solving, attention, and left inattention.  Pt requires overall min assist to complete basic, familiar tasks due to decreased emergent awareness of deficits, impulsivity, decreased selective attention to tasks, decreased recall of daily information, and decreased functional problem solving.  Pt would continue to benefit from skilled ST while inpatient in order to maximize functional independence and reduce burden of care prior to discharge.  Anticipate that pt will need 24/7 supervision and ST follow up at next level of care.     Intensity: Minumum of 1-2 x/day, 30 to 90 minutes Frequency: 3 to 5 out of 7 days Duration/Length of Stay: anticipated d/c date of 1/6 Treatment/Interventions: Cognitive remediation/compensation;Functional tasks;Oral motor exercises;Cueing hierarchy;Internal/external aids;Patient/family education;Medication managment;Speech/Language facilitation;Therapeutic Activities;Therapeutic Exercise;Environmental controls;Dysphagia/aspiration precaution training   Daily Session  Skilled Therapeutic Interventions:  Session 1: Pt was seen  for skilled ST targeting cognitive goals.  SLP facilitated the session with a basic table top task targeting problem solving and selective attention.  Pt selectively attended to  task in a highly distracting environment for ~20 minutes with mod faded to min assist verbal cues.  Pt also benefited from min verbal cues for organization for functional problem solving during the abovementioned task.  Pt was left in wheelchair with quick release belt donned for safety.      Session 2: Pt was seen for skilled ST targeting dysphagia goals.  SLP facilitated the session with a snack of regular textures and thin liquids for ongoing diagnostic treatment of swallowing function.  Pt consumed both solids and liquids without overt s/s of aspiration.  Pt was able to masticate and clear solids from the oral cavity effectively with mod I.  Pt reports no difficulty tolerating his currently prescribed diet.  As a result, recommend discharging swallowing goals. Goals updated on this date to reflect current progress and plan of care.        Function:   Eating Eating   Modified Consistency Diet: No Eating Assist Level: Swallowing techniques: self managed           Cognition Comprehension Comprehension assist level: Understands basic 90% of the time/cues < 10% of the time  Expression      Social Interaction Social Interaction assist level: Interacts appropriately 90% of the time - Needs monitoring or encouragement for participation or interaction.  Problem Solving Problem solving assist level: Solves basic 75 - 89% of the time/requires cueing 10 - 24% of the time  Memory Memory assist level: Recognizes or recalls 75 - 89% of the time/requires cueing 10 - 24% of the time   General    Pain Pain Assessment Pain Assessment: No/denies pain  Therapy/Group: Individual Therapy  Kerrianne Jeng, [name] 12/01/2015, 4:44 PM

## 2015-12-01 NOTE — Progress Notes (Signed)
Subjective/Complaints: Patient seen this morning lying in bed. He states he slept much better last night and he wore his CPAP all night without issues.  ROS: Denies CP,SOB, N/V/D   Objective: Vital Signs: Blood pressure 137/75, pulse 75, temperature 98.9 F (37.2 C), temperature source Oral, resp. rate 20, SpO2 94 %. No results found. Results for orders placed or performed during the hospital encounter of 11/23/15 (from the past 72 hour(s))  Protein electrophoresis, serum     Status: Abnormal   Collection Time: 11/29/15  1:35 PM  Result Value Ref Range   Total Protein ELP 6.5 6.0 - 8.5 g/dL   Albumin ELP 3.0 2.9 - 4.4 g/dL   Alpha-1-Globulin 0.3 0.0 - 0.4 g/dL   Alpha-2-Globulin 1.2 (H) 0.4 - 1.0 g/dL   Beta Globulin 1.1 0.7 - 1.3 g/dL   Gamma Globulin 1.0 0.4 - 1.8 g/dL   M-Spike, % Not Observed Not Observed g/dL   SPE Interp. Comment     Comment: (NOTE) The SPE pattern is suggestive of a subacute inflammatory response. This condition represents an intermediate stage between two possible courses for acute inflammation: total convalescense with a return to normal, or the onset of a chronic inflammatory condition. Performed At: [facility] [place], [place] [id number] Lindon Romp MD VX:[id number]    Comment Comment     Comment: (NOTE) Protein electrophoresis scan will follow via computer, mail, or courier delivery.    GLOBULIN, TOTAL 3.5 2.2 - 3.9 g/dL   A/G Ratio 0.9 0.7 - 1.7  Urinalysis, Routine w reflex microscopic (not at [facility])     Status: Abnormal   Collection Time: 11/29/15  3:22 PM  Result Value Ref Range   Color, Urine YELLOW YELLOW   APPearance CLEAR CLEAR   Specific Gravity, Urine 1.015 1.005 - 1.030   pH 5.0 5.0 - 8.0   Glucose, UA NEGATIVE NEGATIVE mg/dL   Hgb urine dipstick NEGATIVE NEGATIVE   Bilirubin Urine NEGATIVE NEGATIVE   Ketones, ur NEGATIVE NEGATIVE mg/dL   Protein, ur 100 (A) NEGATIVE mg/dL   Nitrite NEGATIVE  NEGATIVE   Leukocytes, UA NEGATIVE NEGATIVE  Protein / creatinine ratio, urine     Status: Abnormal   Collection Time: 11/29/15  3:22 PM  Result Value Ref Range   Creatinine, Urine 90.83 mg/dL   Total Protein, Urine 100 mg/dL    Comment: NO NORMAL RANGE ESTABLISHED FOR THIS TEST   Protein Creatinine Ratio 1.10 (H) 0.00 - 0.15 mg/mg[Cre]  Urine microscopic-add on     Status: Abnormal   Collection Time: 11/29/15  3:22 PM  Result Value Ref Range   Squamous Epithelial / LPF 0-5 (A) NONE SEEN   WBC, UA 0-5 0 - 5 WBC/hpf   RBC / HPF NONE SEEN 0 - 5 RBC/hpf   Bacteria, UA RARE (A) NONE SEEN   Casts GRANULAR CAST (A) NEGATIVE  Renal function panel     Status: Abnormal   Collection Time: 11/30/15  4:20 AM  Result Value Ref Range   Sodium 134 (L) 135 - 145 mmol/L   Potassium 3.1 (L) 3.5 - 5.1 mmol/L   Chloride 95 (L) 101 - 111 mmol/L   CO2 27 22 - 32 mmol/L   Glucose, Bld 95 65 - 99 mg/dL   BUN 22 (H) 6 - 20 mg/dL   Creatinine, Ser 1.29 (H) 0.61 - 1.24 mg/dL   Calcium 9.3 8.9 - 10.3 mg/dL   Phosphorus 6.2 (H) 2.5 - 4.6 mg/dL  Albumin 2.7 (L) 3.5 - 5.0 g/dL   GFR calc non Af Amer >60 >60 mL/min   GFR calc Af Amer >60 >60 mL/min    Comment: (NOTE) The eGFR has been calculated using the CKD EPI equation. This calculation has not been validated in all clinical situations. eGFR's persistently <60 mL/min signify possible Chronic Kidney Disease.    Anion gap 12 5 - 15  Basic metabolic panel     Status: Abnormal   Collection Time: 12/01/15  5:30 AM  Result Value Ref Range   Sodium 138 135 - 145 mmol/L   Potassium 4.1 3.5 - 5.1 mmol/L    Comment: DELTA CHECK NOTED   Chloride 97 (L) 101 - 111 mmol/L   CO2 29 22 - 32 mmol/L   Glucose, Bld 116 (H) 65 - 99 mg/dL   BUN 25 (H) 6 - 20 mg/dL   Creatinine, Ser 1.90 (H) 0.61 - 1.24 mg/dL   Calcium 9.6 8.9 - 10.3 mg/dL   GFR calc non Af Amer 40 (L) >60 mL/min   GFR calc Af Amer 47 (L) >60 mL/min    Comment: (NOTE) The eGFR has been  calculated using the CKD EPI equation. This calculation has not been validated in all clinical situations. eGFR's persistently <60 mL/min signify possible Chronic Kidney Disease.    Anion gap 12 5 - 15    BP 137/75 mmHg  Pulse 75  Temp(Src) 98.9 F (37.2 C) (Oral)  Resp 20  SpO2 94% Gen NAD. Vital signs reviewed  HEENT:  Normocephalic, atraumatic Cardio: RRR and no murmur Resp: CTA B/L and unlabored GI: BS positive and NT, ND Musc/Skel:  No tenderness.  No Edema Neuro: Alert and Oriented Sensation diminished to light touch left upper and left lower extremity Motor: [name]: Shoulder abduction 3/5, elbow flexion/extension 3+/5, finger grip 4/5 [name]: 0/5 RUE/RLE: 5/5 Skin:   Intact. Warm and dry.  Assessment/Plan: 1. Functional deficits secondary to Right ACA infarct with Left hemiparesis  which require 3+ hours per day of interdisciplinary therapy in a comprehensive inpatient rehab setting. Physiatrist is providing close team supervision and 24 hour management of active medical problems listed below. Physiatrist and rehab team continue to assess barriers to discharge/monitor patient progress toward functional and medical goals. FIM: Function - Bathing Position: Shower Body parts bathed by patient: Right arm, Left arm, Chest, Abdomen, Front perineal area, Right upper leg, Left upper leg Body parts bathed by helper: Buttocks, Right lower leg, Left lower leg Bathing not applicable: Back Assist Level:  (mod A)  Function- Upper Body Dressing/Undressing What is the patient wearing?: Pull over shirt/dress Pull over shirt/dress - Perfomed by patient: Thread/unthread right sleeve, Thread/unthread left sleeve, Put head through opening, Pull shirt over trunk Pull over shirt/dress - Perfomed by helper: Pull shirt over trunk Assist Level: Supervision or verbal cues Function - Lower Body Dressing/Undressing What is the patient wearing?: Pants, Non-skid slipper socks Position:  Wheelchair/chair at sink Pants- Performed by patient: Thread/unthread right pants leg Pants- Performed by helper: Thread/unthread left pants leg, Pull pants up/down Non-skid slipper socks- Performed by helper: Don/doff right sock, Don/doff left sock Socks - Performed by helper: Don/doff right sock, Don/doff left sock Shoes - Performed by helper: Don/doff right shoe, Don/doff left shoe, Fasten right, Fasten left Assist for footwear: Dependant Assist for lower body dressing:  (max A)  Function - Toileting Toileting activity did not occur: Refused Toileting steps completed by patient: Adjust clothing prior to toileting, Performs perineal hygiene, Adjust clothing after  toileting Toileting steps completed by helper: Adjust clothing prior to toileting, Performs perineal hygiene, Adjust clothing after toileting Toileting Assistive Devices: Grab bar or rail Assist level: Two helpers  Function - Air cabin crew transfer activity did not occur: Risk analyst transfer assistive device: Facilities manager lift: Stedy Assist level to toilet: Touching or steadying assistance (Pt > 75%) Assist level from toilet: Touching or steadying assistance (Pt > 75%)  Function - Chair/bed transfer Chair/bed transfer method: Squat pivot Chair/bed transfer assist level: Maximal assist (Pt 25 - 49%/lift and lower) Chair/bed transfer assistive device: Armrests Chair/bed transfer details: Verbal cues for precautions/safety, Verbal cues for technique  Function - Locomotion: Wheelchair Will patient use wheelchair at discharge?: Yes Type: Manual Max wheelchair distance: 150 Assist Level: Touching or steadying assistance (Pt > 75%) Assist Level: Touching or steadying assistance (Pt > 75%) Assist Level: Touching or steadying assistance (Pt > 75%) Turns around,maneuvers to table,bed, and toilet,negotiates 3% grade,maneuvers on rugs and over doorsills: No Function - Locomotion: Ambulation Assistive  device: Rail in hallway Max distance: 25 Assist level: 2 helpers Walk 10 feet activity did not occur: Safety/medical concerns Assist level: 2 helpers Walk 50 feet with 2 turns activity did not occur: Safety/medical concerns Walk 150 feet activity did not occur: Safety/medical concerns Walk 10 feet on uneven surfaces activity did not occur: Safety/medical concerns  Function - Comprehension Comprehension: Auditory Comprehension assist level: Understands basic 90% of the time/cues < 10% of the time  Function - Expression Expression: Verbal Expression assist level: Expresses basic 90% of the time/requires cueing < 10% of the time.  Function - Social Interaction Social Interaction assist level: Interacts appropriately 90% of the time - Needs monitoring or encouragement for participation or interaction.  Function - Problem Solving Problem solving assist level: Solves basic 50 - 74% of the time/requires cueing 25 - 49% of the time  Function - Memory Memory assist level: Recognizes or recalls 50 - 74% of the time/requires cueing 25 - 49% of the time Patient normally able to recall (first 3 days only): Current season, Location of own room, Staff names and faces, That he or she is in a hospital  Medical Problem List and Plan: 1.  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  DVT Prophylaxis/Anticoagulation: Mechanical: Sequential compression devices, below knee Bilateral lower extremities 3. Pain Management:  hydrocodone prn for pain 4. Mood: LCSW to follow for evaluation and support.      5. Neuropsych: This patient is not fully capable of making decisions on his own behalf. 6. Skin/Wound Care: Routine pressure relief measures. Maintain adequate nutrition and hydration status.   7. Fluids/Electrolytes/Nutrition: Monitor I/O. Appetite remains good.   Hypokalemia: 4.1 on 12/23 8.  HTN:   HCTZ changed to chlorthalidone on 12/21  Catapres prn additionally for  SBP > 180 or DBP >105 as per Neurology parameters.    Nephro consulted, appreciate recs, Labetolol increased to 200  Will continue to monitor and follow recs per Nephro  Improved this AM, 137/75  Renal ultrasound ordered, remains pending 9. Dyslipidemia: On Lipitor.   10. H/o depression with anxiety: Used to see mental health in the past. Has been off meds for years.   low dose xanax started 11. AKI: Cr. 1.90 on 12/ 23.    Will recheck labs periodically. 12. Epistaxes  Resolved  Humidifier ordered 13. OSA  CPAP ordered  Patient sleeping better with device  LOS (Days) 8 A FACE TO FACE EVALUATION  WAS PERFORMED  Trask Vosler Lorie Phenix 12/01/2015, 9:43 AM

## 2015-12-01 NOTE — Progress Notes (Addendum)
Occupational Therapy Session Note  Patient Details  Name: Taylor Obrien MRN: [id number] Date of Birth: 06-Dec-1966  Today's Date: 12/01/2015 OT Individual Time: 1300-1400 OT Individual Time Calculation (min): 60 min    Short Term Goals: Week 1:  OT Short Term Goal 1 (Week 1): Pt will complete upper body dressing sitting at EOB with mod assist OT Short Term Goal 2 (Week 1): Pt will transfer from w/c to drop-arm commode with max assist OT Short Term Goal 3 (Week 1): Pt will complete 3 of 5 grooming tasks with min cues to sequence OT Short Term Goal 4 (Week 1): Pt will demo improved attention to left as evidenced by ability to locate supplies needed for BADL with min vc OT Short Term Goal 5 (Week 1): Pt will bathe seated at sink with mod assist to maintain supported standing while washing buttocks.  Skilled Therapeutic Interventions/Progress Updates:    1:1 NMR focus on functional use of left hand grossly and [place] with table top activities. Pt transferred w/c to mat squat pivot with min A with extra time and VC and tactile cues.  On EOM focused on in hand manipulation, grasp and release on command, coordination required to manipulate items including picking up items of different sizes and  Opening and closing containers. Pt does present with right hand associative reactions.   See BP in vitals to see limited activity due to high diastolic number.   Left pt in room with safety belt on.   Therapy Documentation Precautions:  Precautions Precautions: Fall Precaution Comments: [name]; watch BP; "alien" L UE Restrictions Weight Bearing Restrictions: No Vital Signs:  144/113 at beginning of session - RN and Pa made aware. PA recommended only seated therapeutic activities at this time Therapy Vitals Pulse Rate: 87 BP: (!) 127/102 mmHg Patient Position (if appropriate): Sitting Pain:  no c/o pain  ADL: ADL ADL Comments: see Functional Tool  See Function Navigator for Current  Functional Status.   Therapy/Group: Individual Therapy  Willeen Cass [facility] 12/01/2015, 2:52 PM

## 2015-12-01 NOTE — Progress Notes (Signed)
Subjective:  Blood pressure up and down- as low as 102/88- as high as 180/90- renal artery duplex neg for RAS- creatinine bumped today- UOP seems OK Objective Vital signs in last 24 hours: Filed Vitals:   11/30/15 2028 11/30/15 2030 12/01/15 0002 12/01/15 0625  BP: 150/80 180/90 110/60 137/75  Pulse: 80 80 76 75  Temp:    98.9 F (37.2 C)  TempSrc:    Oral  Resp:    20  SpO2:    94%   Weight change:   Intake/Output Summary (Last 24 hours) at 12/01/15 1112 Last data filed at 11/30/15 1849  Gross per 24 hour  Intake    480 ml  Output    450 ml  Net     30 ml    Assessment/ Plan: Pt is a 49 y.o. yo male who was admitted on 11/23/2015 with  CVA and poorly controlled HTN  Assessment/Plan: 1. HTN- is on amlodipine 10 daily- admin at 8 AM -chlorthalidone 50 daily at 10 AM- clonidine #3 patch as well as 0.1  PRN- also labetalol 200 TID - has dips that may be too low for him but also some highs- I wonder how much anxiety is having to do with his highs- we may need to use xanax more for his highs instead of going to clonidine- I will make modification in PRN order  2. Renal- creatinine bumped today- possibly because blood pressure got too low for him - see recs for above.  U/A negative for blood- only 100 of protein and granular casts which strengthens the argument for ATN- BP getting too low for him  [facility] [place] A    Labs: Basic Metabolic Panel:  Recent Labs Lab 11/26/15 0530 11/30/15 0420 12/01/15 0530  NA 136 134* 138  K 3.5 3.1* 4.1  CL 97* 95* 97*  CO2 28 27 29   GLUCOSE 126* 95 116*  BUN 21* 22* 25*  CREATININE 1.52* 1.29* 1.90*  CALCIUM 9.8 9.3 9.6  PHOS  --  6.2*  --    Liver Function Tests:  Recent Labs Lab 11/30/15 0420  ALBUMIN 2.7*   No results for input(s): LIPASE, AMYLASE in the last 168 hours. No results for input(s): AMMONIA in the last 168 hours. CBC: No results for input(s): WBC, NEUTROABS, HGB, HCT, MCV, PLT in the last 168 hours. Cardiac  Enzymes: No results for input(s): CKTOTAL, CKMB, CKMBINDEX, TROPONINI in the last 168 hours. CBG: No results for input(s): GLUCAP in the last 168 hours.  Iron Studies: No results for input(s): IRON, TIBC, TRANSFERRIN, FERRITIN in the last 72 hours. Studies/Results: No results found. Medications: Infusions:    Scheduled Medications: . amLODipine  10 mg Oral Daily  . aspirin  325 mg Oral Daily  . atorvastatin  40 mg Oral q1800  . chlorthalidone  50 mg Oral Daily  . cloNIDine  0.3 mg Transdermal Weekly  . folic acid  1 mg Oral Daily  . labetalol  200 mg Oral TID  . multivitamin with minerals  1 tablet Oral Daily  . nicotine  14 mg Transdermal Daily  . pantoprazole  40 mg Oral QHS  . thiamine  100 mg Oral Daily  . traZODone  100 mg Oral QHS    have reviewed scheduled and prn medications.  Physical Exam: General: working with PT- reports dizziness Heart:  RRR Lungs: clear Abdomen: soft, non tender Extremities: no edema     12/01/2015,11:12 AM  LOS: 8 days

## 2015-12-01 NOTE — Progress Notes (Signed)
Occupational Therapy Session Note  Patient Details  Name: Taylor Obrien MRN: [id number] Date of Birth: 08-29-1966  Today's Date: 12/01/2015 OT Individual Time: 0900-1005 OT Individual Time Calculation (min): 65 min    Short Term Goals: Week 1:  OT Short Term Goal 1 (Week 1): Pt will complete upper body dressing sitting at EOB with mod assist OT Short Term Goal 2 (Week 1): Pt will transfer from w/c to drop-arm commode with max assist OT Short Term Goal 3 (Week 1): Pt will complete 3 of 5 grooming tasks with min cues to sequence OT Short Term Goal 4 (Week 1): Pt will demo improved attention to left as evidenced by ability to locate supplies needed for BADL with min vc OT Short Term Goal 5 (Week 1): Pt will bathe seated at sink with mod assist to maintain supported standing while washing buttocks. Week 2:    Week 3:     Skilled Therapeutic Interventions/Progress Updates:    Pt sitting in wc eating breakfast upon OT arrival.  Agreed to shower using stedy for transfers.  Went over UE NMRE instructed in this OT's last session.  Pt recalled exercises with no cues.  Used stedy to toilet and then to shower.  Pt able to perform peri care.  Transferred to shower with stedy.  Did sit to stand with grab bars and mod assist.  Pt completed bathing and then transferred to stedy. Completed dressing at wc level.  Did sit to stand at sink with mod assist.   Pt. Provided verbal cues when he was about to move and when he wished to stand. Pt dressing with sit<>stand at sink side. Pt requiring mod assist for sit <>stand with therapist blocking L knee in standing. Pt utilizing mirror for visual feedback to correct body posture. OT encouraged use of L UE for functional tasks..  Pt has made much improvement with static midline control and posture.   Once dressed, pt seated in wheelchair with quick release belt donned and call bell within reach upon exiting the room.   Therapy Documentation Precautions:   Precautions Precautions: Fall Precaution Comments: [name]; watch BP; "alien" L UE Restrictions Weight Bearing Restrictions: No       Pain: Pain Assessment Pain Assessment: No/denies pain ADL: ADL ADL Comments: see Functional Tool       See Function Navigator for Current Functional Status.   Therapy/Group: Individual Therapy  Lisa Roca 12/01/2015, 6:37 PM

## 2015-12-02 LAB — RENAL FUNCTION PANEL
Albumin: 2.8 g/dL — ABNORMAL LOW (ref 3.5–5.0)
Anion gap: 11 (ref 5–15)
BUN: 25 mg/dL — AB (ref 6–20)
CHLORIDE: 98 mmol/L — AB (ref 101–111)
CO2: 30 mmol/L (ref 22–32)
Calcium: 9.9 mg/dL (ref 8.9–10.3)
Creatinine, Ser: 1.59 mg/dL — ABNORMAL HIGH (ref 0.61–1.24)
GFR calc Af Amer: 58 mL/min — ABNORMAL LOW (ref >60)
GFR calc non Af Amer: 50 mL/min — ABNORMAL LOW (ref >60)
GLUCOSE: 104 mg/dL — AB (ref 65–99)
POTASSIUM: 3.5 mmol/L (ref 3.5–5.1)
Phosphorus: 5 mg/dL — ABNORMAL HIGH (ref 2.5–4.6)
Sodium: 139 mmol/L (ref 135–145)

## 2015-12-02 MED ORDER — SORBITOL 70 % SOLN
30.0000 mL | Freq: Every day | Status: DC | PRN
  Administered 2015-12-02 – 2015-12-18 (×4): 30 mL via ORAL
  Filled 2015-12-02 (×4): qty 30

## 2015-12-02 NOTE — Progress Notes (Signed)
Subjective:  Blood pressure seems good the last 24 hours- creatinine back down  Objective Vital signs in last 24 hours: Filed Vitals:   12/01/15 1350 12/01/15 1416 12/01/15 2034 12/02/15 0605  BP: 127/102 138/78 136/90 140/91  Pulse: 87 80 72 71  Temp:  98.7 F (37.1 C)  98.2 F (36.8 C)  TempSrc:  Oral  Oral  Resp:  20  20  SpO2:  100%  98%   Weight change:   Intake/Output Summary (Last 24 hours) at 12/02/15 [id number] Last data filed at 12/02/15 0606  Gross per 24 hour  Intake    480 ml  Output    300 ml  Net    180 ml    Assessment/ Plan: Pt is a 49 y.o. yo male who was admitted on 11/23/2015 with  CVA and poorly controlled HTN  Assessment/Plan: 1. HTN- is on amlodipine 10 daily- admin at 8 AM -chlorthalidone 50 daily at 10 AM- clonidine #3 patch as well as 0.1  PRN- also labetalol 200 TID - had dips that were too low for him and gave him some ATN-seems to have stabilized out some- I wonder how much anxiety is having to do with his highs- we are using xanax more for his highs instead of going to clonidine- I have made modification in PRN order  2. Renal- creatinine bumped yest- back down today- possibly because blood pressure got too low for him -   U/A negative for blood- only 100 of protein and granular casts which strengthens the argument for ATN- BP getting too low for him- is better today  3. Will check labs on 12/26- will not see tomorrow- call if any questions- will revisit on 12/26  [facility] A    Labs: Basic Metabolic Panel:  Recent Labs Lab 11/30/15 0420 12/01/15 0530 12/02/15 0647  NA 134* 138 139  K 3.1* 4.1 3.5  CL 95* 97* 98*  CO2 27 29 30   GLUCOSE 95 116* 104*  BUN 22* 25* 25*  CREATININE 1.29* 1.90* 1.59*  CALCIUM 9.3 9.6 9.9  PHOS 6.2*  --  5.0*   Liver Function Tests:  Recent Labs Lab 11/30/15 0420 12/02/15 0647  ALBUMIN 2.7* 2.8*   No results for input(s): LIPASE, AMYLASE in the last 168 hours. No results for input(s): AMMONIA in  the last 168 hours. CBC: No results for input(s): WBC, NEUTROABS, HGB, HCT, MCV, PLT in the last 168 hours. Cardiac Enzymes: No results for input(s): CKTOTAL, CKMB, CKMBINDEX, TROPONINI in the last 168 hours. CBG: No results for input(s): GLUCAP in the last 168 hours.  Iron Studies: No results for input(s): IRON, TIBC, TRANSFERRIN, FERRITIN in the last 72 hours. Studies/Results: No results found. Medications: Infusions:    Scheduled Medications: . amLODipine  10 mg Oral Daily  . aspirin  325 mg Oral Daily  . atorvastatin  40 mg Oral q1800  . chlorthalidone  50 mg Oral Daily  . cloNIDine  0.3 mg Transdermal Weekly  . folic acid  1 mg Oral Daily  . labetalol  200 mg Oral TID  . multivitamin with minerals  1 tablet Oral Daily  . nicotine  14 mg Transdermal Daily  . pantoprazole  40 mg Oral QHS  . senna-docusate  2 tablet Oral QHS  . thiamine  100 mg Oral Daily  . traZODone  100 mg Oral QHS    have reviewed scheduled and prn medications.  Physical Exam: General: NAD  Heart:  RRR Lungs: clear Abdomen: soft, non  tender Extremities: no edema     12/02/2015,9:06 AM  LOS: 9 days

## 2015-12-02 NOTE — Progress Notes (Signed)
Subjective/Complaints:  Patient feels well. He is eager to get started with therapy again. Nurse reports he hasn't had a bowel movement since December 19.  Objective: Vital Signs: Blood pressure 140/91, pulse 71, temperature 98.2 F (36.8 C), temperature source Oral, resp. rate 20, SpO2 98 %.    BP 140/91 mmHg  Pulse 71  Temp(Src) 98.2 F (36.8 C) (Oral)  Resp 20  SpO2 98%   Overweight male in no acute distress. HEENT exam atraumatic, normocephalic Neck is supple without lymphadenopathy. Chest is clear to auscultation without increased her current breathing. Cardiac exam S1 and S2 are regular Abdominal exam overweight, bowel sounds, soft, nontender Extremities without significant edema.  Assessment/Plan: 1. Functional deficits secondary to Right ACA infarct with Left hemiparesis    Medical Problem List and Plan: 1.  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  DVT Prophylaxis/Anticoagulation: Mechanical: Sequential compression devices, below knee Bilateral lower extremities 3. Pain Management:  Patient currently denies pain. 4. Mood: LCSW to follow for evaluation and support.      5. Neuropsych: This patient is not fully capable of making decisions on his own behalf. 6. Skin/Wound Care: Routine pressure relief measures. Maintain adequate nutrition and hydration status.   7. Fluids/Electrolytes/Nutrition: Monitor I/O. Appetite remains good.    Basic Metabolic Panel:    Component Value Date/Time   NA 139 12/02/2015 0647   K 3.5 12/02/2015 0647   CL 98* 12/02/2015 0647   CO2 30 12/02/2015 0647   BUN 25* 12/02/2015 0647   CREATININE 1.59* 12/02/2015 0647   GLUCOSE 104* 12/02/2015 0647   CALCIUM 9.9 12/02/2015 0647    8.  HTN: 127/102-144/113  HCTZ changed to chlorthalidone on 12/21  Catapres prn additionally for SBP > 180 or DBP >105 as per Neurology parameters.    Nephrology is following and making recommendations and supplying  orders. 9. Dyslipidemia: On Lipitor.   10. H/o depression with anxiety: Used to see mental health in the past. Has been off meds for years.   low dose xanax started 11. AKI:  See labs above. 12. Epistaxes  No Recurrence. 13. OSA    Patient sleeping better with device  LOS (Days) 9 A FACE TO [place] 12/02/2015, 8:39 AM

## 2015-12-02 NOTE — Progress Notes (Signed)
Placed patient on CPAP and he is tolerating it well. RT will continue to monitor as needed.

## 2015-12-03 NOTE — Progress Notes (Signed)
Subjective/Complaints:  No complaints this morning. He is enjoying therapy and wants to do more of it.  Objective: Vital Signs: Blood pressure 139/82, pulse 65, temperature 97.9 F (36.6 C), temperature source Oral, resp. rate 18, SpO2 100 %.    BP 139/82 mmHg  Pulse 65  Temp(Src) 97.9 F (36.6 C) (Oral)  Resp 18  SpO2 100%   Overweight male in no acute distress. HEENT exam atraumatic, normocephalic Neck is supple without lymphadenopathy. Chest is clear to auscultation without increased her current breathing. Cardiac exam S1 and S2 are regular Abdominal exam overweight, bowel sounds, soft, nontender Extremities without significant edema.  Assessment/Plan: 1. Functional deficits secondary to Right ACA infarct with Left hemiparesis    Medical Problem List and Plan: 1.  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  DVT Prophylaxis/Anticoagulation: Mechanical: Sequential compression devices, below knee Bilateral lower extremities 3. Pain Management:  Patient currently denies pain. 4. Mood: LCSW to follow for evaluation and support.      5. Neuropsych: This patient is not fully capable of making decisions on his own behalf. 6. Skin/Wound Care: Routine pressure relief measures. Maintain adequate nutrition and hydration status.   7. Fluids/Electrolytes/Nutrition: Monitor I/O. Appetite remains good.    Basic Metabolic Panel:    Component Value Date/Time   NA 139 12/02/2015 0647   K 3.5 12/02/2015 0647   CL 98* 12/02/2015 0647   CO2 30 12/02/2015 0647   BUN 25* 12/02/2015 0647   CREATININE 1.59* 12/02/2015 0647   GLUCOSE 104* 12/02/2015 0647   CALCIUM 9.9 12/02/2015 0647    8.  HTN: 132/80-148/93  HCTZ changed to chlorthalidone on 12/21  Catapres prn additionally for SBP > 180 or DBP >105 as per Neurology parameters.    Nephrology is following and making recommendations and supplying orders. 9. Dyslipidemia: On Lipitor.   10. H/o depression  with anxiety: Used to see mental health in the past. Has been off meds for years.   low dose xanax started 11. AKI:  See labs above. 12. Epistaxes  No Recurrence. 13. OSA    Patient sleeping better with device  LOS (Days) [place] 12/03/2015, 8:51 AM

## 2015-12-03 NOTE — Progress Notes (Signed)
Placed patient on CPAP for the night via auto-mode.  

## 2015-12-04 ENCOUNTER — Inpatient Hospital Stay (HOSPITAL_COMMUNITY): Payer: Medicaid Other | Admitting: Physical Therapy

## 2015-12-04 ENCOUNTER — Inpatient Hospital Stay (HOSPITAL_COMMUNITY): Payer: Medicaid Other | Admitting: Occupational Therapy

## 2015-12-04 ENCOUNTER — Inpatient Hospital Stay (HOSPITAL_COMMUNITY): Payer: Medicaid Other | Admitting: Speech Pathology

## 2015-12-04 LAB — RENAL FUNCTION PANEL
ALBUMIN: 3.3 g/dL — AB (ref 3.5–5.0)
Anion gap: 10 (ref 5–15)
BUN: 23 mg/dL — AB (ref 6–20)
CO2: 30 mmol/L (ref 22–32)
CREATININE: 1.68 mg/dL — AB (ref 0.61–1.24)
Calcium: 9.9 mg/dL (ref 8.9–10.3)
Chloride: 98 mmol/L — ABNORMAL LOW (ref 101–111)
GFR calc Af Amer: 54 mL/min — ABNORMAL LOW (ref >60)
GFR, EST NON AFRICAN AMERICAN: 47 mL/min — AB (ref >60)
Glucose, Bld: 112 mg/dL — ABNORMAL HIGH (ref 65–99)
PHOSPHORUS: 5.1 mg/dL — AB (ref 2.5–4.6)
Potassium: 3.5 mmol/L (ref 3.5–5.1)
SODIUM: 138 mmol/L (ref 135–145)

## 2015-12-04 MED ORDER — CLOTRIMAZOLE 1 % EX CREA
TOPICAL_CREAM | Freq: Two times a day (BID) | CUTANEOUS | Status: DC
  Administered 2015-12-04: 12:00:00 via TOPICAL
  Administered 2015-12-05: 1 via TOPICAL
  Administered 2015-12-05 – 2015-12-19 (×17): via TOPICAL
  Filled 2015-12-04: qty 15

## 2015-12-04 NOTE — Progress Notes (Signed)
Occupational Therapy Session Note  Patient Details  Name: Taylor Obrien MRN: [id number] Date of Birth: 31-Aug-1966  Today's Date: 12/04/2015 OT Individual Time:  - 0800-0900   (60 min)      Short Term Goals: Week 1:  OT Short Term Goal 1 (Week 1): Pt will complete upper body dressing sitting at EOB with mod assist OT Short Term Goal 2 (Week 1): Pt will transfer from w/c to drop-arm commode with max assist OT Short Term Goal 3 (Week 1): Pt will complete 3 of 5 grooming tasks with min cues to sequence OT Short Term Goal 4 (Week 1): Pt will demo improved attention to left as evidenced by ability to locate supplies needed for BADL with min vc OT Short Term Goal 5 (Week 1): Pt will bathe seated at sink with mod assist to maintain supported standing while washing buttocks. :     Skilled Therapeutic Interventions/Progress Updates:    Addressed NMRE, functional mobility in ADL setting, transfers.  Pt reported no pain.   Trasferred >wc>shower chair with max assist;  sit to stand with mod assist for transitional mvmts.  Gripping with [name] through out session with increased time to release.  Pt needed min assist to released 25 % of time to release grip.  Completed dressing with mod cues to verbalize his transitional movements.  Pt became more consistent hear end of session with this.  SEE Function.  Pt. Left in wc with next therapy.     Therapy Documentation Precautions:  Precautions Precautions: Fall Precaution Comments: [name]; watch BP; "alien" L UE Restrictions Weight Bearing Restrictions: No      Pain: Pain Assessment Pain Score: none  ADL ADL Comments: see Functional Tool     See Function Navigator for Current Functional Status.   Therapy/Group: Individual Therapy  Lisa Roca 12/04/2015, 7:45 AM

## 2015-12-04 NOTE — Progress Notes (Signed)
Subjective/Complaints: Patient seen this morning resting comfortably in bed. He states he is wearing his CPAP for the last 3 or 4 days and has had good sleep as a result. He states he had a "boring weekend" and is anxious to resume therapies.  ROS: Denies CP,SOB, N/V/D   Objective: Vital Signs: Blood pressure 151/89, pulse 66, temperature 98 F (36.7 C), temperature source Oral, resp. rate 18, SpO2 100 %. No results found. Results for orders placed or performed during the hospital encounter of 11/23/15 (from the past 72 hour(s))  Renal function panel     Status: Abnormal   Collection Time: 12/02/15  6:47 AM  Result Value Ref Range   Sodium 139 135 - 145 mmol/L   Potassium 3.5 3.5 - 5.1 mmol/L   Chloride 98 (L) 101 - 111 mmol/L   CO2 30 22 - 32 mmol/L   Glucose, Bld 104 (H) 65 - 99 mg/dL   BUN 25 (H) 6 - 20 mg/dL   Creatinine, Ser 1.59 (H) 0.61 - 1.24 mg/dL   Calcium 9.9 8.9 - 10.3 mg/dL   Phosphorus 5.0 (H) 2.5 - 4.6 mg/dL   Albumin 2.8 (L) 3.5 - 5.0 g/dL   GFR calc non Af Amer 50 (L) >60 mL/min   GFR calc Af Amer 58 (L) >60 mL/min    Comment: (NOTE) The eGFR has been calculated using the CKD EPI equation. This calculation has not been validated in all clinical situations. eGFR's persistently <60 mL/min signify possible Chronic Kidney Disease.    Anion gap 11 5 - 15  Renal function panel     Status: Abnormal   Collection Time: 12/04/15  6:40 AM  Result Value Ref Range   Sodium 138 135 - 145 mmol/L   Potassium 3.5 3.5 - 5.1 mmol/L   Chloride 98 (L) 101 - 111 mmol/L   CO2 30 22 - 32 mmol/L   Glucose, Bld 112 (H) 65 - 99 mg/dL   BUN 23 (H) 6 - 20 mg/dL   Creatinine, Ser 1.68 (H) 0.61 - 1.24 mg/dL   Calcium 9.9 8.9 - 10.3 mg/dL   Phosphorus 5.1 (H) 2.5 - 4.6 mg/dL   Albumin 3.3 (L) 3.5 - 5.0 g/dL   GFR calc non Af Amer 47 (L) >60 mL/min   GFR calc Af Amer 54 (L) >60 mL/min    Comment: (NOTE) The eGFR has been calculated using the CKD EPI equation. This calculation has  not been validated in all clinical situations. eGFR's persistently <60 mL/min signify possible Chronic Kidney Disease.    Anion gap 10 5 - 15    BP 151/89 mmHg  Pulse 66  Temp(Src) 98 F (36.7 C) (Oral)  Resp 18  SpO2 100% Gen NAD. Vital signs reviewed  HEENT:  Normocephalic, atraumatic Cardio: RRR and no murmur Resp: CTA B/L and unlabored GI: BS positive and NT, ND Musc/Skel:  No tenderness.  No Edema Neuro: Alert and Oriented Sensation diminished to light touch left upper and left lower extremity Motor: [name]: Shoulder abduction 4+/5, elbow flexion/extension 4+/5, finger grip 4/5 [name]: 0/5 RUE/RLE: 5/5 Skin:   Intact. Warm and dry.  Assessment/Plan: 1. Functional deficits secondary to Right ACA infarct with Left hemiparesis  which require 3+ hours per day of interdisciplinary therapy in a comprehensive inpatient rehab setting. Physiatrist is providing close team supervision and 24 hour management of active medical problems listed below. Physiatrist and rehab team continue to assess barriers to discharge/monitor patient progress toward functional and medical goals. FIM: Function -  Bathing Position: Shower Body parts bathed by patient: Right arm, Left arm, Chest, Abdomen, Front perineal area, Right upper leg, Left upper leg Body parts bathed by helper: Right lower leg, Left lower leg, Buttocks Bathing not applicable: Back Assist Level: Touching or steadying assistance(Pt > 75%)  Function- Upper Body Dressing/Undressing What is the patient wearing?: Pull over shirt/dress Pull over shirt/dress - Perfomed by patient: Thread/unthread right sleeve, Thread/unthread left sleeve, Put head through opening, Pull shirt over trunk Pull over shirt/dress - Perfomed by helper: Pull shirt over trunk Assist Level: Supervision or verbal cues Function - Lower Body Dressing/Undressing What is the patient wearing?: Pants Position: Bed Pants- Performed by patient: Thread/unthread right pants  leg Pants- Performed by helper: Thread/unthread left pants leg, Pull pants up/down Non-skid slipper socks- Performed by helper: Don/doff right sock, Don/doff left sock Socks - Performed by helper: Don/doff right sock, Don/doff left sock Shoes - Performed by helper: Don/doff right shoe, Don/doff left shoe, Fasten right, Fasten left Assist for footwear: Dependant Assist for lower body dressing:  (max A)  Function - Toileting Toileting activity did not occur: No continent bowel/bladder event Toileting steps completed by patient: Adjust clothing prior to toileting, Performs perineal hygiene, Adjust clothing after toileting Toileting steps completed by helper: Adjust clothing prior to toileting, Performs perineal hygiene, Adjust clothing after toileting Toileting Assistive Devices: Grab bar or rail Assist level: Touching or steadying assistance (Pt.75%)  Function - Air cabin crew transfer activity did not occur: N/A Toilet transfer assistive device: Mechanical lift Mechanical lift: Stedy Assist level to toilet: Touching or steadying assistance (Pt > 75%) Assist level from toilet: Touching or steadying assistance (Pt > 75%)  Function - Chair/bed transfer Chair/bed transfer method: Squat pivot Chair/bed transfer assist level: Maximal assist (Pt 25 - 49%/lift and lower) Chair/bed transfer assistive device: Armrests Chair/bed transfer details: Verbal cues for precautions/safety, Verbal cues for technique  Function - Locomotion: Wheelchair Will patient use wheelchair at discharge?: Yes Type: Manual Max wheelchair distance: 150 Assist Level: Touching or steadying assistance (Pt > 75%) Assist Level: Touching or steadying assistance (Pt > 75%) Assist Level: Touching or steadying assistance (Pt > 75%) Turns around,maneuvers to table,bed, and toilet,negotiates 3% grade,maneuvers on rugs and over doorsills: No Function - Locomotion: Ambulation Assistive device: Rail in hallway Max  distance: 25 Assist level: 2 helpers Walk 10 feet activity did not occur: Safety/medical concerns Assist level: 2 helpers Walk 50 feet with 2 turns activity did not occur: Safety/medical concerns Walk 150 feet activity did not occur: Safety/medical concerns Walk 10 feet on uneven surfaces activity did not occur: Safety/medical concerns  Function - Comprehension Comprehension: Auditory Comprehension assist level: Understands basic 90% of the time/cues < 10% of the time  Function - Expression Expression: Verbal Expression assist level: Expresses basic 90% of the time/requires cueing < 10% of the time.  Function - Social Interaction Social Interaction assist level: Interacts appropriately 90% of the time - Needs monitoring or encouragement for participation or interaction.  Function - Problem Solving Problem solving assist level: Solves basic 75 - 89% of the time/requires cueing 10 - 24% of the time  Function - Memory Memory assist level: Recognizes or recalls 75 - 89% of the time/requires cueing 10 - 24% of the time Patient normally able to recall (first 3 days only): Current season, Location of own room, Staff names and faces, That he or she is in a hospital  Medical Problem List and Plan: 1.  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  DVT Prophylaxis/Anticoagulation: Mechanical: Sequential compression devices, below knee Bilateral lower extremities 3. Pain Management:  hydrocodone prn for pain 4. Mood: LCSW to follow for evaluation and support.      5. Neuropsych: This patient is not fully capable of making decisions on his own behalf. 6. Skin/Wound Care: Routine pressure relief measures. Maintain adequate nutrition and hydration status.   7. Fluids/Electrolytes/Nutrition: Monitor I/O. Appetite remains good.   Hypokalemia:  15 on 12/26 8.  HTN:   HCTZ changed to chlorthalidone on 12/21  Catapres prn additionally for SBP > 180 or DBP >105 as per  Neurology parameters.  Nephro consulted, appreciate recs, Labetolol increased to 200  Will continue to monitor and follow recs per Nephro  Renal ultrasound  Unremarkable 9. Dyslipidemia: On Lipitor.   10. H/o depression with anxiety: Used to see mental health in the past. Has been off meds for years.   low dose xanax started 11. AKI/CKD: Cr.  1.68 on 12/26    Will recheck labs periodically. 12. Epistaxes  Resolved  Humidifier ordered 13. OSA  CPAP ordered  Patient sleeping better with device 14. Fungal rash  He states he gets this at home as well  Will start Lotrimin on 12/26  LOS (Days) 11 A FACE TO FACE EVALUATION WAS PERFORMED  [name] 12/04/2015, 9:44 AM

## 2015-12-04 NOTE — Progress Notes (Signed)
Physical Therapy Note  Patient Details  Name: Taylor Obrien MRN: [id number] Date of Birth: 10-04-1966 Today's Date: 12/04/2015    Time: 1045-1155 70 minutes  1:1 No c/o pain.  Session focused on NMR with forced use of L LE.  Pt performed multiple reps of standing reaching tasks in all directions with reaching and squatting with max A for balance, tactile cues for posture and L LE control. Pt requires max manual facilitation to control L LE in stance and for trunk control during R LE movement tasks. Pt attempted to step with R LE but L LE still too weak to be safe and functional, pt then attempts to tap toes and heel on R with max A for standing balance and L LE control, much improved with repetition.  Functional transfers with min A to strong side, mod A to weak side with max cuing for sequencing and safety.  Pt continues to be motivated to improve.   Taylor Obrien 12/04/2015, 2:34 PM

## 2015-12-04 NOTE — Progress Notes (Signed)
Physical Therapy Weekly Progress Note  Patient Details  Name: Taylor Obrien MRN: [id number] Date of Birth: 09-25-1966  Beginning of progress report period: November 24, 2015 End of progress report period: December 04, 2015   Patient has met 4 of 4 short term goals.  Improved attention and awareness, increased strength and mobility.  Patient continues to demonstrate the following deficits: decreased L sided strength, impaired postural control, trunk weakness, decreased mobility and therefore will continue to benefit from skilled PT intervention to enhance overall performance with activity tolerance, balance, postural control, ability to compensate for deficits, functional use of  left upper extremity and left lower extremity, attention, awareness and coordination.  Patient progressing toward long term goals. Gait goal downgraded to max A.  Continue plan of care.  PT Short Term Goals Week 1:  PT Short Term Goal 1 (Week 1): Pt will be able to demonstrate static sitting balance at midline with mod assist x 5 min PT Short Term Goal 1 - Progress (Week 1): Met PT Short Term Goal 2 (Week 1): Pt will be able to perform basic transfers on level surfaces with max assist of 1 PT Short Term Goal 2 - Progress (Week 1): Met PT Short Term Goal 3 (Week 1): Pt will be able to attend to [name] and [name] during transfers 50% of the time PT Short Term Goal 3 - Progress (Week 1): Met PT Short Term Goal 4 (Week 1): Pt will be able to propel w/c with hemitechnique x 50' with min assist PT Short Term Goal 4 - Progress (Week 1): Met Week 2:  PT Short Term Goal 1 (Week 2): Pt will consistently perform bed <> chair transfers with min A PT Short Term Goal 2 (Week 2): Pt will perform gait 20' with +2 assist PT Short Term Goal 3 (Week 2): Pt will demo standing balance for functional task with max A  Skilled Therapeutic Interventions/Progress Updates:  Ambulation/gait training;Balance/vestibular training;Cognitive  remediation/compensation;Community reintegration;Discharge planning;Disease management/prevention;DME/adaptive equipment instruction;Functional mobility training;Functional electrical stimulation;Neuromuscular re-education;Pain management;Patient/family education;Psychosocial support;Skin care/wound management;Splinting/orthotics;Stair training;Therapeutic Activities;Therapeutic Exercise;UE/LE Strength taining/ROM;UE/LE Coordination activities;Visual/perceptual remediation/compensation;Wheelchair propulsion/positioning     See Function Navigator for Current Functional Status.   DONAWERTH,KAREN 12/04/2015, 7:45 AM

## 2015-12-04 NOTE — Progress Notes (Signed)
Subjective:  Blood pressure seems reasonable the last 48 hours- creatinine stable- report floaters but no dizziness  Objective Vital signs in last 24 hours: Filed Vitals:   12/03/15 1400 12/03/15 2042 12/03/15 2226 12/04/15 0526  BP: 126/74 134/84  151/89  Pulse: 81 81 77 66  Temp: 98.7 F (37.1 C)   98 F (36.7 C)  TempSrc: Oral   Oral  Resp: 18  16 18   SpO2: 98%  97% 100%   Weight change:   Intake/Output Summary (Last 24 hours) at 12/04/15 0954 Last data filed at 12/04/15 [id number]  Gross per 24 hour  Intake    720 ml  Output   1300 ml  Net   -580 ml    Assessment/ Plan: Pt is a 49 y.o. yo male who was admitted on 11/23/2015 with  CVA and poorly controlled HTN  Assessment/Plan: 1. HTN- is on amlodipine 10 daily- admin at 8 AM -chlorthalidone 50 daily at 10 AM- clonidine #3 patch as well as 0.1  PRN- also labetalol 200 TID - had dips that were too low for him and gave him some ATN-seems to have stabilized out some- no changes in regimen  2. Renal- creatinine bumped up to 2 this hosp- back down and fairly stable today-    U/A negative for blood- only 100 of protein and granular casts, SPEP neg- crt now as good as it was the last few months, suspect this is just due to variability in BP still- will take time to settle- will order chemistries later in the week to assess 3. Will order labs to be checked on 12/28 and follow at a distance- call us back for concerns .    Kamille Toomey A    Labs: Basic Metabolic Panel:  Recent Labs Lab 11/30/15 0420 12/01/15 0530 12/02/15 0647 12/04/15 0640  NA 134* 138 139 138  K 3.1* 4.1 3.5 3.5  CL 95* 97* 98* 98*  CO2 27 29 30 30   GLUCOSE 95 116* 104* 112*  BUN 22* 25* 25* 23*  CREATININE 1.29* 1.90* 1.59* 1.68*  CALCIUM 9.3 9.6 9.9 9.9  PHOS 6.2*  --  5.0* 5.1*   Liver Function Tests:  Recent Labs Lab 11/30/15 0420 12/02/15 0647 12/04/15 0640  ALBUMIN 2.7* 2.8* 3.3*   No results for input(s): LIPASE, AMYLASE in the last  168 hours. No results for input(s): AMMONIA in the last 168 hours. CBC: No results for input(s): WBC, NEUTROABS, HGB, HCT, MCV, PLT in the last 168 hours. Cardiac Enzymes: No results for input(s): CKTOTAL, CKMB, CKMBINDEX, TROPONINI in the last 168 hours. CBG: No results for input(s): GLUCAP in the last 168 hours.  Iron Studies: No results for input(s): IRON, TIBC, TRANSFERRIN, FERRITIN in the last 72 hours. Studies/Results: No results found. Medications: Infusions:    Scheduled Medications: . amLODipine  10 mg Oral Daily  . aspirin  325 mg Oral Daily  . atorvastatin  40 mg Oral q1800  . chlorthalidone  50 mg Oral Daily  . cloNIDine  0.3 mg Transdermal Weekly  . clotrimazole   Topical BID  . folic acid  1 mg Oral Daily  . labetalol  200 mg Oral TID  . multivitamin with minerals  1 tablet Oral Daily  . nicotine  14 mg Transdermal Daily  . pantoprazole  40 mg Oral QHS  . senna-docusate  2 tablet Oral QHS  . thiamine  100 mg Oral Daily  . traZODone  100 mg Oral QHS    have reviewed  scheduled and prn medications.  Physical Exam: General: NAD  Heart:  RRR Lungs: clear Abdomen: soft, non tender Extremities: no edema     12/04/2015,9:54 AM  LOS: 11 days

## 2015-12-04 NOTE — Progress Notes (Signed)
Speech Language Pathology Daily Session Note  Patient Details  Name: Taylor Obrien MRN: [id number] Date of Birth: 1966/03/20  Today's Date: 12/04/2015 SLP Individual Time: 0903-1000 SLP Individual Time Calculation (min): 57 min  Short Term Goals: Week 2: SLP Short Term Goal 1 (Week 2): Pt will locate call bell and demonstrate appropriate use with supervision cues in 75% of observable opportunities. SLP Short Term Goal 2 (Week 2): pt will demonstrate functional problem solving for basic and familiar tasks with supervision cues.  SLP Short Term Goal 3 (Week 2): Pt will utilize external aids to recall daily, functional information with min A verbal and visual cues.  SLP Short Term Goal 4 (Week 2): Patient will demonstrate selective attention in a mildly distracting enviornment for 15 minutes with supervision cues for redirection.  SLP Short Term Goal 5 (Week 2): Patient will attend to the left field of enviornment/[name] during functional tasks with supervision cues.   Skilled Therapeutic Interventions:  Pt was seen for skilled ST targeting cognitive goals.  SLP facilitated the session with a novel card game targeting memory and functional problem solving.  Pt generated category-picture associations to facilitate category recall with min assist verbal cues.  Pt was able to recall 4 out of 4 categories after a 2-3 minute delay with supervision question cues.  Pt required min assist faded to supervision cues for mental flexibility to generate specific category members.  Pt selectively attended to task in a moderately distracting environment for 30 minute intervals with supervision cues for redirection.  Continue per current plan of care.    Function:  Eating Eating                 Cognition Comprehension Comprehension assist level: Understands basic 90% of the time/cues < 10% of the time  Expression   Expression assist level: Expresses basic 90% of the time/requires cueing < 10% of the time.   Social Interaction Social Interaction assist level: Interacts appropriately 90% of the time - Needs monitoring or encouragement for participation or interaction.  Problem Solving Problem solving assist level: Solves basic 75 - 89% of the time/requires cueing 10 - 24% of the time  Memory Memory assist level: Recognizes or recalls 75 - 89% of the time/requires cueing 10 - 24% of the time    Pain Pain Assessment Pain Assessment: No/denies pain  Therapy/Group: Individual Therapy  Aryella Besecker, [name] 12/04/2015, 12:13 PM

## 2015-12-04 NOTE — Progress Notes (Signed)
Patient placed on CPAP for the night with no complications. Patient is tolerating it well and RT will continue to monitor as needed.

## 2015-12-05 ENCOUNTER — Inpatient Hospital Stay (HOSPITAL_COMMUNITY): Payer: Medicaid Other | Admitting: Occupational Therapy

## 2015-12-05 ENCOUNTER — Inpatient Hospital Stay (HOSPITAL_COMMUNITY): Payer: Medicaid Other | Admitting: Physical Therapy

## 2015-12-05 ENCOUNTER — Inpatient Hospital Stay (HOSPITAL_COMMUNITY): Payer: Medicaid Other | Admitting: Speech Pathology

## 2015-12-05 MED ORDER — CITALOPRAM HYDROBROMIDE 10 MG PO TABS
10.0000 mg | ORAL_TABLET | Freq: Every day | ORAL | Status: DC
Start: 2015-12-05 — End: 2015-12-20
  Administered 2015-12-05 – 2015-12-20 (×16): 10 mg via ORAL
  Filled 2015-12-05 (×16): qty 1

## 2015-12-05 NOTE — Progress Notes (Signed)
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 rate 18, SpO2 100 %. No results found. Results for orders placed or performed during the hospital encounter of 11/23/15 (from the past 72 hour(s))  Renal function panel     Status: Abnormal   Collection Time: 12/04/15  6:40 AM  Result Value Ref Range   Sodium 138 135 - 145 mmol/L   Potassium 3.5 3.5 - 5.1 mmol/L   Chloride 98 (L) 101 - 111 mmol/L   CO2 30 22 - 32 mmol/L   Glucose, Bld 112 (H) 65 - 99 mg/dL   BUN 23 (H) 6 - 20 mg/dL   Creatinine, Ser 1.68 (H) 0.61 - 1.24 mg/dL   Calcium 9.9 8.9 - 10.3 mg/dL   Phosphorus 5.1 (H) 2.5 - 4.6 mg/dL   Albumin 3.3 (L) 3.5 - 5.0 g/dL   GFR calc non Af Amer 47 (L) >60 mL/min   GFR calc Af Amer 54 (L) >60 mL/min    Comment: (NOTE) The eGFR has been calculated using the CKD EPI equation. This calculation has not been validated in all clinical situations. eGFR's persistently <60 mL/min signify possible Chronic Kidney Disease.    Anion gap 10 5 - 15    BP 149/87 mmHg  Pulse 70  Temp(Src) 98.6 F (37 C) (Oral)  Resp 18  SpO2 100% Gen NAD. Vital signs reviewed  HEENT:  Normocephalic, atraumatic Cardio: RRR and no murmur Resp: CTA B/L and unlabored GI: BS positive and NT, ND Musc/Skel:  No tenderness.  No Edema Neuro: Alert and Oriented Sensation diminished to light touch left upper and left lower extremity Motor: [name]: Shoulder abduction 4+/5, elbow flexion/extension 4+/5, finger grip 4/5 [name]: 0/5 RUE/RLE: 5/5 Skin:   Intact. Warm and dry.  Assessment/Plan: 1. Functional deficits secondary to Right ACA infarct with Left hemiparesis  which require 3+ hours per day of interdisciplinary therapy in a comprehensive inpatient rehab setting. Physiatrist is providing close team supervision and 24 hour management of active medical problems listed  below. Physiatrist and rehab team continue to assess barriers to discharge/monitor patient progress toward functional and medical goals. FIM: Function - Bathing Position: Shower Body parts bathed by patient: Right arm, Left arm, Chest, Abdomen, Front perineal area, Right upper leg, Left upper leg Body parts bathed by helper: Right lower leg, Left lower leg, Buttocks Bathing not applicable: Back Assist Level: Touching or steadying assistance(Pt > 75%)  Function- Upper Body Dressing/Undressing What is the patient wearing?: Pull over shirt/dress Pull over shirt/dress - Perfomed by patient: Thread/unthread right sleeve, Thread/unthread left sleeve, Put head through opening, Pull shirt over trunk Pull over shirt/dress - Perfomed by helper: Pull shirt over trunk Assist Level: Touching or steadying assistance(Pt > 75%) Function - Lower Body Dressing/Undressing What is the patient wearing?: Pants Position: Wheelchair/chair at sink Pants- Performed by patient: Thread/unthread right pants leg, Pull pants up/down, Fasten/unfasten pants Pants- Performed by helper: Thread/unthread left pants leg Non-skid slipper socks- Performed by helper: Don/doff right sock, Don/doff left sock Socks - Performed by helper: Don/doff right sock, Don/doff left sock Shoes - Performed by helper: Don/doff right shoe, Don/doff left shoe, Fasten right, Fasten left Assist for footwear: Dependant Assist for lower body dressing:  (max A)  Function - Toileting Toileting activity did not occur: No continent bowel/bladder event Toileting steps completed by patient: Adjust clothing prior to toileting, Performs perineal  hygiene, Adjust clothing after toileting Toileting steps completed by helper: Adjust clothing prior to toileting, Performs perineal hygiene, Adjust clothing after toileting Toileting Assistive Devices: Grab bar or rail Assist level: Touching or steadying assistance (Pt.75%)  Function - Toilet Transfers Toilet  transfer activity did not occur: N/A Toilet transfer assistive device: Mechanical lift Mechanical lift: Stedy Assist level to toilet: Touching or steadying assistance (Pt > 75%) Assist level from toilet: Touching or steadying assistance (Pt > 75%)  Function - Chair/bed transfer Chair/bed transfer method: Squat pivot Chair/bed transfer assist level: Moderate assist (Pt 50 - 74%/lift or lower) Chair/bed transfer assistive device: Armrests Chair/bed transfer details: Verbal cues for precautions/safety, Verbal cues for technique  Function - Locomotion: Wheelchair Will patient use wheelchair at discharge?: Yes Type: Manual Max wheelchair distance: 150 Assist Level: Touching or steadying assistance (Pt > 75%) Assist Level: Touching or steadying assistance (Pt > 75%) Assist Level: Touching or steadying assistance (Pt > 75%) Turns around,maneuvers to table,bed, and toilet,negotiates 3% grade,maneuvers on rugs and over doorsills: No Function - Locomotion: Ambulation Assistive device: Rail in hallway Max distance: 25 Assist level: 2 helpers Walk 10 feet activity did not occur: Safety/medical concerns Assist level: 2 helpers Walk 50 feet with 2 turns activity did not occur: Safety/medical concerns Walk 150 feet activity did not occur: Safety/medical concerns Walk 10 feet on uneven surfaces activity did not occur: Safety/medical concerns  Function - Comprehension Comprehension: Auditory Comprehension assist level: Understands basic 90% of the time/cues < 10% of the time  Function - Expression Expression: Verbal Expression assist level: Expresses basic 90% of the time/requires cueing < 10% of the time.  Function - Social Interaction Social Interaction assist level: Interacts appropriately 90% of the time - Needs monitoring or encouragement for participation or interaction.  Function - Problem Solving Problem solving assist level: Solves basic 75 - 89% of the time/requires cueing 10 - 24%  of the time  Function - Memory Memory assist level: Recognizes or recalls 75 - 89% of the time/requires cueing 10 - 24% of the time Patient normally able to recall (first 3 days only): Current season, Location of own room, Staff names and faces, That he or she is in a hospital  Medical Problem List and Plan: 1.  Left Hemiparesis secondary to Right ACA infarct, also reduce sensation [name]>[name]  alien arm symptoms [name] 2.  DVT Prophylaxis/Anticoagulation: Mechanical: Sequential compression devices, below knee Bilateral lower extremities 3. Pain Management:  hydrocodone prn for pain 4. Mood: LCSW to follow for evaluation and support.      5. Neuropsych: This patient is not fully capable of making decisions on his own behalf. 6. Skin/Wound Care: Routine pressure relief measures. Maintain adequate nutrition and hydration status.   7. Fluids/Electrolytes/Nutrition: Monitor I/O. Appetite remains good.   Hypokalemia:  boderline low at 3.5 last BMET 8.  HTN:   HCTZ changed to chlorthalidone on 12/21  Catapres prn additionally for SBP > 180 or DBP >105 as per Neurology parameters.  Nephro consulted, appreciate recs, Labetolol increased to 200  Will continue to monitor and follow recs per Nephro  Renal ultrasound  Unremarkable 9. Dyslipidemia: On Lipitor.   10. H/o depression with anxiety: Used to see mental health in the past. Has been off meds for years. Start SSRI  low dose xanax started 11. AKI/CKD: Cr.  1.68 on 12/26  - Nephro following  Will recheck labs periodically. 12. Epistaxis  Resolved  Humidifier ordered 13. OSA  CPAP ordered  Patient sleeping better with  device 14. Fungal rash  He states he gets this at home as well  Will start Lotrimin on 12/26  LOS (Days) 12 A FACE TO FACE EVALUATION WAS PERFORMED  Azarya Oconnell E 12/05/2015, 7:51 AM

## 2015-12-05 NOTE — Progress Notes (Signed)
Occupational Therapy Session Note  Patient Details  Name: Taylor Obrien MRN: [id number] Date of Birth: [birth year]  Today's Date: 12/05/2015 OT Individual Time: 1400-1430 OT Individual Time Calculation (min): 30 min    Short Term Goals: Week 1:  OT Short Term Goal 1 (Week 1): Pt will complete upper body dressing sitting at EOB with mod assist OT Short Term Goal 2 (Week 1): Pt will transfer from w/c to drop-arm commode with max assist OT Short Term Goal 3 (Week 1): Pt will complete 3 of 5 grooming tasks with min cues to sequence OT Short Term Goal 4 (Week 1): Pt will demo improved attention to left as evidenced by ability to locate supplies needed for BADL with min vc OT Short Term Goal 5 (Week 1): Pt will bathe seated at sink with mod assist to maintain supported standing while washing buttocks.  Skilled Therapeutic Interventions/Progress Updates:    Pt seen for OT tx session focusing on neuro re-ed with functional use of L UE. Pt sitting up in w/c upon arrival, agreeable to tx session. Pt's wife present for session. He self propelled w/c to therapy gym with increased time using hemi technique and VCs to attend to task. In gym, pt completed functional reaching task, reaching for items in various planes with L UE and placing them on left side. Pt demonstrated good functional control and grasp/ release of L UE during intentional movements with L UE reaching for cups. Then completed grasp/ release using smaller size object (golf balls), able to manipulate with increased time and concentration.  Pt then completed stereogenosis activity reaching into back to pick out various ADL items. Pt able to correctly identify ~80% of objects. Pt returned to room total A at end of session, left sitting in room with all needs in reach and wife present.   Therapy Documentation Precautions:  Precautions Precautions: Fall Precaution Comments: [name]; watch BP; "alien" L UE Restrictions Weight Bearing  Restrictions: No Pain:   No/ denies pain ADL: ADL ADL Comments: see Functional Tool  See Function Navigator for Current Functional Status.   Therapy/Group: Individual Therapy  Lewis, Brennan Litzinger C 12/05/2015, 7:22 AM

## 2015-12-05 NOTE — Plan of Care (Signed)
Problem: RH SAFETY Goal: RH STG ADHERE TO SAFETY PRECAUTIONS W/ASSISTANCE/DEVICE STG Adhere to Safety Precautions With Assistance/Device. Supervision  Outcome: Progressing Utilizing bed alarm and quick release belt up in chair. Patient calls for assist .

## 2015-12-05 NOTE — Progress Notes (Signed)
Occupational Therapy Weekly Progress Note  Patient Details  Name: Taylor Obrien MRN: [id number] Date of Birth: [birth year]  Beginning of progress report period: November 25, 2015 End of progress report period: December 05, 2015  Today's Date: 12/05/2015 OT Individual Time: 0900-1000 OT Individual Time Calculation (min): 60 min    Patient has met 5 of 5 short term goals.  Pt making steady progress towards goals and shows motivation towards OT intervention. Pt continues to have decreased coordination, motor planning, and strength in L UE/LE. Pt performing toilet transfer and shower transfer with mod A for squat pivot transfers. Bed >wheechair to the R side with min A squat pivot and mod A needed for transfer to the left side. Pt needing mod verbal cues for proper technique and safety. Pt will continue to benefit from OT intervention.   Patient continues to demonstrate the following deficits: decreased I in self care, balance, functional mobility, functional use of L UE/LE, safety, and standing and therefore will continue to benefit from skilled OT intervention to enhance overall performance with BADL.  Patient progressing toward long term goals..  Continue plan of care.  OT Short Term Goals Week 1:  OT Short Term Goal 1 (Week 1): Pt will complete upper body dressing sitting at EOB with mod assist OT Short Term Goal 1 - Progress (Week 1): Met OT Short Term Goal 2 (Week 1): Pt will transfer from w/c to drop-arm commode with max assist OT Short Term Goal 2 - Progress (Week 1): Met OT Short Term Goal 3 (Week 1): Pt will complete 3 of 5 grooming tasks with min cues to sequence OT Short Term Goal 3 - Progress (Week 1): Met OT Short Term Goal 4 (Week 1): Pt will demo improved attention to left as evidenced by ability to locate supplies needed for BADL with min vc OT Short Term Goal 4 - Progress (Week 1): Met OT Short Term Goal 5 (Week 1): Pt will bathe seated at sink with mod assist to maintain  supported standing while washing buttocks. OT Short Term Goal 5 - Progress (Week 1): Met Week 2:  OT Short Term Goal 1 (Week 2): STGs=LTGs secondary to estimated LOS  Skilled Therapeutic Interventions/Progress Updates:  Upon entering the room, pt supine in bed with wife present in the room. Pt with no c/o pain this session. He declines bathing and dressing this session. Pt needing Mod A for supine >sit for trunk and L LE with use of bed rails. Pt sitting on EOB with mod A verbal cues for proper technique for squat pivot transfer bed >wheelchair with mod A for lifting. Pt propelling wheelchair to sink for grooming tasks while seated with set up - supervision and forced use of L UE for functional tasks. Pt brushing teeth so hard he caused bleeding in gums. Pt engaged in bilateral coordination task of folding laundry with Both hands. Pt requiring mod verbal cues for technique and encouragement. Pt discovering that smaller articles of clothing are much easier to manage. Pt remained seated in wheelchair at end of session with wife present in room. She requested quick release belt remained off while she stayed with pt.   Therapy Documentation Precautions:  Precautions Precautions: Fall Precaution Comments: [name]; watch BP; "alien" L UE Restrictions Weight Bearing Restrictions: No General:   Vital Signs: Therapy Vitals Pulse Rate: 76 BP: (!) 150/80 mmHg Pain: Pain Assessment Pain Assessment: No/denies pain Pain Score: 2  ADL: ADL ADL Comments: see Functional Tool Exercises:  Other Treatments:    See Function Navigator for Current Functional Status.   Therapy/Group: Individual Therapy  Phineas Semen 12/05/2015, 12:29 PM

## 2015-12-05 NOTE — Plan of Care (Signed)
Problem: RH Cognition - SLP Goal: RH LTG Patient will demonstrate orientation with cues LTG: Patient will demonstrate orientation to person/place/time/situation with cues (SLP)  Outcome: Not Applicable Date Met:  09/32/67 Pt fully oriented consistently, goal no longer applicable  Problem: RH Problem Solving Goal: LTG Patient will demonstrate problem solving for (SLP) LTG: Patient will demonstrate problem solving for basic/complex daily situations with cues (SLP)  Upgraded   Problem: RH Memory Goal: LTG Patient will follow step by step directions w/cues (SLP) LTG: Patient will follow step by step directions with cues (SLP).  Upgraded     Goal: LTG Patient will use memory compensatory aids to (SLP) LTG: Patient will use memory compensatory aids to recall biographical/new, daily complex information with cues (SLP)  Upgraded

## 2015-12-05 NOTE — Progress Notes (Signed)
Physical Therapy Note  Patient Details  Name: Taylor Obrien MRN: [id number] Date of Birth: 04/20/1966 Today's Date: 12/05/2015    Time: 1030-1125 55 minutes  1:1 No c/o pain.  NMR in standing with focus on forced use of L LE and postural control. Pt requires mod/max manual facilitation for standing balance and posture, max-total A when wt shifting onto L LE. Pt performed squats with tactile cues and focus on full extension of  L LE, improved with repetition. kinetron for LE strengthening with pt able to push pedal with L LE without assistance at no resistance.  Gait in hallway with railing with max A for posture and L LE control. Pt still with difficulty with stance phase control of L LE, needs total A to advance L LE. Some adductor tone emerging in L LE.  Pt pleased with progress and increasing L LE strength.   Jovian Lembcke 12/05/2015, 11:24 AM

## 2015-12-05 NOTE — Progress Notes (Addendum)
Speech Language Pathology Daily Session Note  Patient Details  Name: Taylor Obrien MRN: [id number] Date of Birth: March 29, 1966  Today's Date: 12/05/2015 SLP Individual Time: 1005-1030; 1447- 1515 SLP Individual Time Calculation (min): 25 min; 28 min   Short Term Goals: Week 2: SLP Short Term Goal 1 (Week 2): Pt will locate call bell and demonstrate appropriate use with supervision cues in 75% of observable opportunities. SLP Short Term Goal 2 (Week 2): pt will demonstrate functional problem solving for basic and familiar tasks with supervision cues.  SLP Short Term Goal 3 (Week 2): Pt will utilize external aids to recall daily, functional information with min A verbal and visual cues.  SLP Short Term Goal 4 (Week 2): Patient will demonstrate selective attention in a mildly distracting enviornment for 15 minutes with supervision cues for redirection.  SLP Short Term Goal 5 (Week 2): Patient will attend to the left field of enviornment/[name] during functional tasks with supervision cues.   Skilled Therapeutic Interventions:  Session 1: Pt was seen for skilled ST targeting cognitive goals.  Upon arrival, pt was transferring from toilet with assistance from his wife and use of Stedy.  Wife had been signed off to complete transfers.  Pt required overall supervision verbal cues for safety during transfers and pt demonstrated improved impulsivity in comparison to initial therapy sessions.  SLP facilitated the session with a deductive reasoning task targeting functional problem solving, during which pt required min assist verbal cues for organization.  Pt began but did not complete task due to time constraints; as a result, SLP will address task at next available appointment.   Session 2: Pt was seen for skilled ST targeting cognitive goals.  Pt completed the deductive reasoning task from this morning's therapy session with mod assist verbal cues to recognize and correct errors.  SLP also facilitated the  session with a semi-complex scheduling task targeting functional problem solving.  Pt required overall min assist verbal cues for organization to sequence 7 steps for  100% accuracy during the abovementioned task.  Pt was left in wheelchair with wife at bedside.  Continue per current plan of care.    Function:  Eating Eating                 Cognition Comprehension Comprehension assist level: Follows basic conversation/direction with extra time/assistive device  Expression   Expression assist level: Expresses basic 90% of the time/requires cueing < 10% of the time.  Social Interaction Social Interaction assist level: Interacts appropriately 75 - 89% of the time - Needs redirection for appropriate language or to initiate interaction.  Problem Solving Problem solving assist level: Solves basic 75 - 89% of the time/requires cueing 10 - 24% of the time  Memory Memory assist level: Recognizes or recalls 75 - 89% of the time/requires cueing 10 - 24% of the time    Pain Pain Assessment (Session 1)  Pain Assessment: No/denies pain Pain Assessment (Session 2)  Pain Assessment: No/denies pain   Therapy/Group: Individual Therapy  Alethea Terhaar, [name] 12/05/2015, 12:13 PM

## 2015-12-06 ENCOUNTER — Inpatient Hospital Stay (HOSPITAL_COMMUNITY): Payer: Medicaid Other

## 2015-12-06 ENCOUNTER — Inpatient Hospital Stay (HOSPITAL_COMMUNITY): Payer: Medicaid Other | Admitting: Occupational Therapy

## 2015-12-06 ENCOUNTER — Inpatient Hospital Stay (HOSPITAL_COMMUNITY): Payer: Medicaid Other | Admitting: *Deleted

## 2015-12-06 ENCOUNTER — Inpatient Hospital Stay (HOSPITAL_COMMUNITY): Payer: Medicaid Other | Admitting: Speech Pathology

## 2015-12-06 LAB — RENAL FUNCTION PANEL
Albumin: 3.2 g/dL — ABNORMAL LOW (ref 3.5–5.0)
Anion gap: 10 (ref 5–15)
BUN: 20 mg/dL (ref 6–20)
CALCIUM: 10 mg/dL (ref 8.9–10.3)
CO2: 30 mmol/L (ref 22–32)
CREATININE: 1.58 mg/dL — AB (ref 0.61–1.24)
Chloride: 97 mmol/L — ABNORMAL LOW (ref 101–111)
GFR, EST AFRICAN AMERICAN: 59 mL/min — AB (ref >60)
GFR, EST NON AFRICAN AMERICAN: 51 mL/min — AB (ref >60)
Glucose, Bld: 124 mg/dL — ABNORMAL HIGH (ref 65–99)
Phosphorus: 5.4 mg/dL — ABNORMAL HIGH (ref 2.5–4.6)
Potassium: 3.4 mmol/L — ABNORMAL LOW (ref 3.5–5.1)
SODIUM: 137 mmol/L (ref 135–145)

## 2015-12-06 MED ORDER — POTASSIUM CHLORIDE CRYS ER 10 MEQ PO TBCR
10.0000 meq | EXTENDED_RELEASE_TABLET | Freq: Every day | ORAL | Status: DC
  Administered 2015-12-06 – 2015-12-19 (×14): 10 meq via ORAL
  Filled 2015-12-06 (×14): qty 1

## 2015-12-06 NOTE — Patient Care Conference (Signed)
Inpatient RehabilitationTeam Conference and Plan of Care Update Date: 12/06/2015   Time: 10:45 AM    Patient Name: Taylor Obrien      Medical Record Number: [id number]  Date of Birth: 10/07/1966 Sex: Male         Room/Bed: 4W16C/4W16C-01 Payor Info: Payor: MEDICAID [place] / Plan: MEDICAID [place] ACCESS / Product Type: *No Product type* /    Admitting Diagnosis: r cva  Admit Date/Time:  11/23/2015  6:43 PM Admission Comments: No comment available   Primary Diagnosis:  Hemiplegia and hemiparesis following unspecified cerebrovascular disease affecting left non-dominant side (HCC) Principal Problem: Hemiplegia and hemiparesis following unspecified cerebrovascular disease affecting left non-dominant side [facility])  Patient Active Problem List   Diagnosis Date Noted  . OSA (obstructive sleep apnea)   . Hypokalemia   . Elevated blood pressure   . Hemiparesis affecting left side as late effect of stroke ([place])   . Epistaxis   . HTN (hypertension) 11/27/2015  . AKI (acute kidney injury) ([place]) 11/27/2015  . Hemiplegia and hemiparesis following unspecified cerebrovascular disease affecting left non-dominant side ([place]) 11/23/2015  . Gait disturbance, post-stroke 11/23/2015  . Stroke due to occlusion of right anterior cerebral artery ([place]) 11/23/2015  . Cerebrovascular accident (CVA) due to occlusion of right anterior cerebral artery ([place])   . Essential hypertension   . Depression   . Chronic pain syndrome   . ETOH abuse   . Marijuana abuse   . Cerebrovascular accident (CVA) due to thrombosis of right carotid artery ([place])   . Malignant hypertension   . Hyperlipidemia   . CVA (cerebral infarction) 11/18/2015  . Stroke (cerebrum) ([place]) 11/18/2015  . Chest pain 10/09/2012  . GERD (gastroesophageal reflux disease) 10/09/2012  . Chronic back pain 10/09/2012  . Tobacco abuse 10/09/2012  . Hypertensive heart disease 08/31/2012  . Accelerated hypertension 08/31/2012  . Precordial pain 08/31/2012     Expected Discharge Date: Expected Discharge Date: 12/20/15  Team Members Present: Social Worker Present: Ovidio Kin, LCSW Nurse Present: Dorien Chihuahua, RN PT Present: Georjean Mode, PT OT Present: Clyda Greener, OT SLP Present: Windell Moulding, SLP PPS Coordinator present : Daiva Nakayama, RN, CRRN     Current Status/Progress Goal Weekly Team Focus  Medical   absent sensation, poor [name] control. alien hand  home at [facility]  orthotic management , family training   Bowel/Bladder   continent bowel and bladder 1 episode of incontinence during the night , patient thought he had on condom cath. LBM 12-26  mod I  continue with plan of care    Swallow/Nutrition/ Hydration   regular, thin; ST signed off on dysphagia goals.           ADL's   mod A functional transfers, Min A UB self care, Mod A for LB self care, Mod A bathing, standing balance with mod assistance as well.   supervision - min A  NMR for L, pt/family education, transfer training, standing balanbce, self care re training.    Mobility   mod transfers, max/total for limited gait, min assist w/c x 150'; very little motor return [name]  supervision bed mobility and w/c mobility, min A transfers, mod A standing and gait  neuro re-ed, mobility, pre-gait/gait, safety, family ed, pt ed, family ed   Communication     na        Safety/Cognition/ Behavioral Observations  Min assist for semi-complex tasks, improved impulsivity and attention to the left, improved awareness of deficits, improved selective attention to tasks.  Supervision, upgraded   continue to address awareness of deficits, higher level problem solving, selective attention to tasks, recall of daily information   Pain   pain decresed  min use of vicodin decrease headaches  less than 3  continue with plan of care .   Skin   dry flaky skin to bilateral edges of nose and cheeks. lotimen ointment applied   no new breakdown  continue with plan of care       *See Care Plan and  progress notes for long and short-term goals.  Barriers to Discharge: heavy assist wife taking care of 4 kids at home    Possible Resolutions to Barriers:  cont med management, extend stay    Discharge Planning/Teaching Needs:  Wife has not been in as mcuh kids out of school for holiday-will need to begin family education ealy next week      Team Discussion:  Team felt turing a corner and able to participate more in therapies and making progress. Goals met for SP swallowing. L-UE better, still lack sensation in arm and leg. AFO-ordered. celexa started-mood brighter. Wife here to observe and participating. Four steps into home, questioning if needs a ramp  Revisions to Treatment Plan:  Changed discharge date due to progress-1/11   Continued Need for Acute Rehabilitation Level of Care: The patient requires daily medical management by a physician with specialized training in physical medicine and rehabilitation for the following conditions: Daily direction of a multidisciplinary physical rehabilitation program to ensure safe treatment while eliciting the highest outcome that is of practical value to the patient.: Yes Daily medical management of patient stability for increased activity during participation in an intensive rehabilitation regime.: Yes Daily analysis of laboratory values and/or radiology reports with any subsequent need for medication adjustment of medical intervention for : Neurological problems;Other  Elease Hashimoto 12/07/2015, 8:36 AM

## 2015-12-06 NOTE — Progress Notes (Signed)
Occupational Therapy Note  Patient Details  Name: TARY DIEUDONNE MRN: [id number] Date of Birth: 24-Jul-1966  Today's Date: 12/06/2015 OT Individual Time: 1300-1345 OT Individual Time Calculation (min): 45 min   Pt denied pain Individual Therapy  Pt engaged in dynamic standing/squating tasks while reaching for objects with [name] across midline and to left.  Pt performed squat from seated position to reach objects outside BOS and place the object in a different plane while remaining in squat position.  Pt required support on Lt knee and min A at trunk to maintain balance.  Pt was able to grasp and release objects during tasks.  Pt also engaged in assembling a PVC structure utilizing BUE to complete task.  Pt continues to exhibit impulsive movements during transitions required mod multimodal cues for safety and sequencing.   Leotis Shames [facility] 12/06/2015, 2:53 PM

## 2015-12-06 NOTE — Progress Notes (Signed)
Occupational Therapy Session Note  Patient Details  Name: Taylor Obrien MRN: [id number] Date of Birth: 1966/03/12  Today's Date: 12/06/2015 OT Individual Time: 0800-0900 OT Individual Time Calculation (min): 60 min    Short Term Goals: Week 2:  OT Short Term Goal 1 (Week 2): STGs=LTGs secondary to estimated LOS  Skilled Therapeutic Interventions/Progress Updates:    Pt already stated he had washed up earlier with assistance from wife secondary to bladder incontinence.  Worked on Probation officer with increased time.  Pt noted with pinching of the left hand with difficulty releasing objects.  Max assist needed for crossing the [name] over the right knee to donn sock and shoe.  He was able to use both UEs to donn sock and shoe on the right foot with max assist.  Mod instructional cueing also needed for sequencing LB dressing as well.  Second part of session had him working on [name] functional use, sit to stand transitions and standing balance.  He needed mod assist for sit to stand and max assist to maintain standing while incorporating [name] functional reach.  He demonstrates increased rotation to the left in standing and needs initial hand over hand to grasp clothespins with the [name].  He was able to carry them out with supervision after the initial one.  Pt internally distracted at times by gas episodes resulting in the need to sit back down.  Max facilitation for activation of the left knee in standing and to correct LOB posteriorly.    Therapy Documentation Precautions:  Precautions Precautions: Fall Precaution Comments: [name]; watch BP; "alien" L UE Restrictions Weight Bearing Restrictions: No  Pain: Pain Assessment Pain Assessment: No/denies pain ADL: ADL ADL Comments: see Functional Tool  See Function Navigator for Current Functional Status.   Therapy/Group: Individual Therapy  Johneric Mcfadden OTR/L 12/06/2015, 10:42 AM

## 2015-12-06 NOTE — Progress Notes (Addendum)
Physical Therapy Session Note  Patient Details  Name: Taylor Obrien MRN: [id number] Date of Birth: [birth year]  Today's Date: 12/06/2015 PT Individual Time: 1130-1200; 1400-1430 PT Individual Time Calculation (min): 30 min , 30 min  Short Term Goals: Week 2:  PT Short Term Goal 1 (Week 2): Pt will consistently perform bed <> chair transfers with min A PT Short Term Goal 2 (Week 2): Pt will perform gait 20' with +2 assist PT Short Term Goal 3 (Week 2): Pt will demo standing balance for functional task with max A  Skilled Therapeutic Interventions/Progress Updates:  tx 1:  Wife Rosario Adie present and assisted when asked.  Therapeutic activity standing in static stander x 20 minutes without c/o, during L hand pinch grip/release of clothes pins on /off vertical rod.  neuromuscular re-education via manual cues, VCS, visual feedback bil hip terminal extension, fine motor function L hand, and midline orientation due to L pelvic back rotation and drift L. Pt returned to sitting in w/c, quick release belt donned, and wife pushed pt back to room. Pt's sitting posture poor with present cushion in w/c; marked posterior pelvic tilt; plan to change.     tx 2:  Squat pivot to R and L with mod assist, focusing on head/hips relationship and avoiding ballistic movements.  Family ed training for bed mobility, supine exs.  Sit><supine through R side-lying with mod assist for trunk and [name].  Wife trained in bil bridging without use of UEs, and bil lower trunk rotation x 10 x 2 each. W/c cushion changed to [employer] with apple wood insert for improved pelvic positioning and comfort. Pt passed off to SLP.  Therapy Documentation Precautions:  Precautions Precautions: Fall Precaution Comments: [name]; watch BP; "alien" L UE Restrictions Weight Bearing Restrictions: No   Pain: Pain Assessment Pain Assessment: No/denies pain            See Function Navigator for Current Functional  Status.   Therapy/Group: Individual Therapy  Adasyn Mcadams 12/06/2015, 12:06 PM

## 2015-12-06 NOTE — Progress Notes (Addendum)
Subjective/Complaints: Seen in therapy this morning. Patient is standing but mainly bears weight on the right side. No significant problems with spasticity noted.  ROS: Denies CP,SOB, N/V/D   Objective: Vital Signs: Blood pressure 150/80, pulse 68, temperature 98.4 F (36.9 C), temperature source Oral, resp. rate 17, SpO2 100 %. No results found. Results for orders placed or performed during the hospital encounter of 11/23/15 (from the past 72 hour(s))  Renal function panel     Status: Abnormal   Collection Time: 12/04/15  6:40 AM  Result Value Ref Range   Sodium 138 135 - 145 mmol/L   Potassium 3.5 3.5 - 5.1 mmol/L   Chloride 98 (L) 101 - 111 mmol/L   CO2 30 22 - 32 mmol/L   Glucose, Bld 112 (H) 65 - 99 mg/dL   BUN 23 (H) 6 - 20 mg/dL   Creatinine, Ser 1.68 (H) 0.61 - 1.24 mg/dL   Calcium 9.9 8.9 - 10.3 mg/dL   Phosphorus 5.1 (H) 2.5 - 4.6 mg/dL   Albumin 3.3 (L) 3.5 - 5.0 g/dL   GFR calc non Af Amer 47 (L) >60 mL/min   GFR calc Af Amer 54 (L) >60 mL/min    Comment: (NOTE) The eGFR has been calculated using the CKD EPI equation. This calculation has not been validated in all clinical situations. eGFR's persistently <60 mL/min signify possible Chronic Kidney Disease.    Anion gap 10 5 - 15  Renal function panel     Status: Abnormal   Collection Time: 12/06/15  5:33 AM  Result Value Ref Range   Sodium 137 135 - 145 mmol/L   Potassium 3.4 (L) 3.5 - 5.1 mmol/L   Chloride 97 (L) 101 - 111 mmol/L   CO2 30 22 - 32 mmol/L   Glucose, Bld 124 (H) 65 - 99 mg/dL   BUN 20 6 - 20 mg/dL   Creatinine, Ser 1.58 (H) 0.61 - 1.24 mg/dL   Calcium 10.0 8.9 - 10.3 mg/dL   Phosphorus 5.4 (H) 2.5 - 4.6 mg/dL   Albumin 3.2 (L) 3.5 - 5.0 g/dL   GFR calc non Af Amer 51 (L) >60 mL/min   GFR calc Af Amer 59 (L) >60 mL/min    Comment: (NOTE) The eGFR has been calculated using the CKD EPI equation. This calculation has not been validated in all clinical situations. eGFR's persistently <60 mL/min  signify possible Chronic Kidney Disease.    Anion gap 10 5 - 15    BP 150/80 mmHg  Pulse 68  Temp(Src) 98.4 F (36.9 C) (Oral)  Resp 17  SpO2 100% Gen NAD. Vital signs reviewed  HEENT:  Normocephalic, atraumatic Cardio: RRR and no murmur Resp: CTA B/L and unlabored GI: BS positive and NT, ND Musc/Skel:  No tenderness.  No Edema Neuro: Alert and Oriented Sensation Absent to light touch left upper and left lower extremity Motor: [name]: Shoulder abduction 4+/5, elbow flexion/extension 4+/5, finger grip 4/5 [name]: 0/5 RUE/RLE: 5/5 Skin:   Intact. Warm and dry.  Assessment/Plan: 1. Functional deficits secondary to Right ACA infarct with Left hemiparesis  which require 3+ hours per day of interdisciplinary therapy in a comprehensive inpatient rehab setting. Physiatrist is providing close team supervision and 24 hour management of active medical problems listed below. Physiatrist and rehab team continue to assess barriers to discharge/monitor patient progress toward functional and medical goals. FIM: Function - Bathing Bathing activity did not occur: Refused Position: Shower Body parts bathed by patient: Right arm, Left arm, Chest, Abdomen,  Front perineal area, Right upper leg, Left upper leg Body parts bathed by helper: Right lower leg, Left lower leg, Buttocks Bathing not applicable: Back Assist Level: Touching or steadying assistance(Pt > 75%)  Function- Upper Body Dressing/Undressing Upper body dressing/undressing activity did not occur: Refused What is the patient wearing?: Pull over shirt/dress Pull over shirt/dress - Perfomed by patient: Thread/unthread right sleeve, Thread/unthread left sleeve, Put head through opening, Pull shirt over trunk Pull over shirt/dress - Perfomed by helper: Pull shirt over trunk Assist Level: Touching or steadying assistance(Pt > 75%) Function - Lower Body Dressing/Undressing Lower body dressing/undressing activity did not occur: Refused What is  the patient wearing?: Pants Position: Wheelchair/chair at sink Pants- Performed by patient: Thread/unthread right pants leg, Pull pants up/down, Fasten/unfasten pants Pants- Performed by helper: Thread/unthread left pants leg Non-skid slipper socks- Performed by helper: Don/doff right sock, Don/doff left sock Socks - Performed by helper: Don/doff right sock, Don/doff left sock Shoes - Performed by helper: Don/doff right shoe, Don/doff left shoe, Fasten right, Fasten left Assist for footwear: Dependant Assist for lower body dressing:  (max A)  Function - Toileting Toileting activity did not occur: No continent bowel/bladder event Toileting steps completed by patient: Adjust clothing prior to toileting, Performs perineal hygiene, Adjust clothing after toileting Toileting steps completed by helper: Adjust clothing prior to toileting, Performs perineal hygiene, Adjust clothing after toileting Toileting Assistive Devices: Grab bar or rail Assist level: Touching or steadying assistance (Pt.75%)  Function - Air cabin crew transfer activity did not occur: N/A Toilet transfer assistive device: Mechanical lift Mechanical lift: Stedy Assist level to toilet: Touching or steadying assistance (Pt > 75%) Assist level from toilet: Touching or steadying assistance (Pt > 75%)  Function - Chair/bed transfer Chair/bed transfer method: Squat pivot Chair/bed transfer assist level: Moderate assist (Pt 50 - 74%/lift or lower) Chair/bed transfer assistive device: Armrests Chair/bed transfer details: Verbal cues for precautions/safety, Verbal cues for technique  Function - Locomotion: Wheelchair Will patient use wheelchair at discharge?: Yes Type: Manual Max wheelchair distance: 150 Assist Level: Touching or steadying assistance (Pt > 75%) Assist Level: Touching or steadying assistance (Pt > 75%) Assist Level: Touching or steadying assistance (Pt > 75%) Turns around,maneuvers to table,bed, and  toilet,negotiates 3% grade,maneuvers on rugs and over doorsills: No Function - Locomotion: Ambulation Assistive device: Rail in hallway Max distance: 25 Assist level: 2 helpers Walk 10 feet activity did not occur: Safety/medical concerns Assist level: 2 helpers Walk 50 feet with 2 turns activity did not occur: Safety/medical concerns Walk 150 feet activity did not occur: Safety/medical concerns Walk 10 feet on uneven surfaces activity did not occur: Safety/medical concerns  Function - Comprehension Comprehension: Auditory Comprehension assist level: Follows basic conversation/direction with extra time/assistive device  Function - Expression Expression: Verbal Expression assist level: Expresses complex 90% of the time/cues < 10% of the time  Function - Social Interaction Social Interaction assist level: Interacts appropriately 75 - 89% of the time - Needs redirection for appropriate language or to initiate interaction.  Function - Problem Solving Problem solving assist level: Solves basic 75 - 89% of the time/requires cueing 10 - 24% of the time  Function - Memory Memory assist level: Recognizes or recalls 75 - 89% of the time/requires cueing 10 - 24% of the time Patient normally able to recall (first 3 days only): Current season, Location of own room, Staff names and faces, That he or she is in a hospital  Medical Problem List and Plan: 1.  Left Hemiparesis  secondary to Right ACA infarct, also reduce sensation [name]>[name]-Team conference today please see physician documentation under team conference tab, met with team face-to-face to discuss problems,progress, and goals. Formulized individual treatment plan based on medical history, underlying problem and comorbidities.  alien arm symptoms [name] 2.  DVT Prophylaxis/Anticoagulation: Mechanical: Sequential compression devices, below knee Bilateral lower extremities 3. Pain Management:  hydrocodone prn for pain 4. Mood: LCSW to follow for  evaluation and support.      5. Neuropsych: This patient is not fully capable of making decisions on his own behalf. 6. Skin/Wound Care: Routine pressure relief measures. Maintain adequate nutrition and hydration status.   7. Fluids/Electrolytes/Nutrition: Monitor I/O. Appetite remains good.   Hypokalemia:  boderline low at 3.5 last BMET, Recheck be met today, K3.4 will increase supplementation 8.  HTN: 150/81 this am  HCTZ changed to chlorthalidone on 12/21  [place]ap[place] prn additionally for SBP > 180 or DBP >105 as per Neurology parameters.  Nephro consulted, appreciate recs, Labetolol increased to 200     9. Dyslipidemia: On Lipitor.   10. H/o depression with anxiety: Used to see mental health in the past. Has been off meds for years. Start SSRI- Celexa on 12/27  low dose xanax started, no plans to d/c home on this 11. AKI/CKD: GFR 51 today, improving  Will recheck labs periodically.  12. OSA  CPAP at noc     LOS (Days) 13 A FACE TO FACE EVALUATION WAS PERFORMED  KIRSTEINS,ANDREW E 12/06/2015, 8:34 AM

## 2015-12-06 NOTE — Progress Notes (Signed)
Social Work Patient ID: Taylor Obrien, male   DOB: 23-May-1966, 49 y.o.   MRN: [id number] Met with pt and wife to discuss team conference goals and progress, pt upset discharge date changed and wants to know if does better can he go home sooner. Discussed the amount of care he will be for his wife and the need to get him as high level as possible before going home. Concerned about the four steps into home and possible Need for ramp. Wife pleased with how well he is doing but also asking if he will have someone to assist her at home. Will see if eligible for PCS services to assist wife with his care. Made him aware if he progresses more quickly he could go home sooner. They also have four children ranging in age from 60-9 yo, wife is responsible for. Will conitnue to work on discharge needs  For home.

## 2015-12-06 NOTE — Progress Notes (Signed)
Speech Language Pathology Daily Session Note  Patient Details  Name: Taylor Obrien MRN: [id number] Date of Birth: [birth year]  Today's Date: 12/06/2015 SLP Individual Time: 1430-1500 SLP Individual Time Calculation (min): 30 min  Short Term Goals: Week 2: SLP Short Term Goal 1 (Week 2): Pt will locate call bell and demonstrate appropriate use with supervision cues in 75% of observable opportunities. SLP Short Term Goal 2 (Week 2): pt will demonstrate functional problem solving for basic and familiar tasks with supervision cues.  SLP Short Term Goal 3 (Week 2): Pt will utilize external aids to recall daily, functional information with min A verbal and visual cues.  SLP Short Term Goal 4 (Week 2): Patient will demonstrate selective attention in a mildly distracting enviornment for 15 minutes with supervision cues for redirection.  SLP Short Term Goal 5 (Week 2): Patient will attend to the left field of enviornment/[name] during functional tasks with supervision cues.   Skilled Therapeutic Interventions:  Pt was seen for skilled ST targeting cognitive goals. SLP facilitated the session with a money management task targeting functional problem solving.  Pt balanced a check book with overall min assist verbal cues for organization as well as to recognize and correct errors for 100% accuracy.  Pt was returned to room and left in wheelchair with quick release belt donned and wife at bedside.  Continue per current plan of care.    Function:  Eating Eating                 Cognition Comprehension Comprehension assist level: Follows basic conversation/direction with extra time/assistive device  Expression   Expression assist level: Expresses complex 90% of the time/cues < 10% of the time  Social Interaction Social Interaction assist level: Interacts appropriately 75 - 89% of the time - Needs redirection for appropriate language or to initiate interaction.  Problem Solving Problem solving assist  level: Solves basic 75 - 89% of the time/requires cueing 10 - 24% of the time  Memory Memory assist level: Recognizes or recalls 75 - 89% of the time/requires cueing 10 - 24% of the time    Pain Pain Assessment Pain Assessment: No/denies pain  Therapy/Group: Individual Therapy  Areatha Kalata, [name] 12/06/2015, 3:50 PM

## 2015-12-07 ENCOUNTER — Inpatient Hospital Stay (HOSPITAL_COMMUNITY): Payer: Medicaid Other

## 2015-12-07 ENCOUNTER — Inpatient Hospital Stay (HOSPITAL_COMMUNITY): Payer: Medicaid Other | Admitting: Occupational Therapy

## 2015-12-07 ENCOUNTER — Inpatient Hospital Stay (HOSPITAL_COMMUNITY): Payer: Medicaid Other | Admitting: Speech Pathology

## 2015-12-07 NOTE — Progress Notes (Addendum)
Speech Language Pathology Daily Session Note  Patient Details  Name: Taylor Obrien MRN: [id number] Date of Birth: May 28, 1966  Today's Date: 12/07/2015 SLP Individual Time: 1003  - [id number]; 60 min     Short Term Goals: Week 2: SLP Short Term Goal 1 (Week 2): Pt will locate call bell and demonstrate appropriate use with supervision cues in 75% of observable opportunities. SLP Short Term Goal 2 (Week 2): pt will demonstrate functional problem solving for basic and familiar tasks with supervision cues.  SLP Short Term Goal 3 (Week 2): Pt will utilize external aids to recall daily, functional information with min A verbal and visual cues.  SLP Short Term Goal 4 (Week 2): Patient will demonstrate selective attention in a mildly distracting enviornment for 15 minutes with supervision cues for redirection.  SLP Short Term Goal 5 (Week 2): Patient will attend to the left field of enviornment/[name] during functional tasks with supervision cues.   Skilled Therapeutic Interventions:  Pt was seen for skilled ST targeting cognitive goals.  SLP facilitated the session with a medication management task targeting functional problem solving and recall of daily information.  Pt recalled function of medications when named for ~50% accuracy independently, improving to ~75% accuracy with mod assist verbal cues and repetition of information via spaced retrieval training.  Pt required overall min assist verbal cues for organization and to recognize and correct errors when loading pills of varying dosages and frequencies into a QD pill organizer.  Pt was returned to room and left in wheelchair with wife at bedside.  Continue per current plan of care.    Function:  Eating Eating               Cognition Comprehension Comprehension assist level: Follows basic conversation/direction with extra time/assistive device  Expression   Expression assist level: Expresses complex 90% of the time/cues < 10% of the time   Social Interaction Social Interaction assist level: Interacts appropriately 75 - 89% of the time - Needs redirection for appropriate language or to initiate interaction.  Problem Solving Problem solving assist level: Solves basic 25 - 49% of the time - needs direction more than half the time to initiate, plan or complete simple activities  Memory Memory assist level: Recognizes or recalls 25 - 49% of the time/requires cueing 50 - 75% of the time    Pain Pain Assessment Pain Assessment: 0-10 Pain Score: 4 Pain Type: Acute pain Pain Location: Head Pain Descriptors / Indicators: Aching Pain Intervention(s):RN made aware  Therapy/Group: Individual Therapy  Kissie Ziolkowski, [name] 12/07/2015, 12:42 PM

## 2015-12-07 NOTE — Progress Notes (Signed)
   12/04/15 1900  What Happened  Was fall witnessed? No  Was patient injured? No  Patient found on floor  Found by Staff-comment (Melissa R and Lyman Balingit H)  Stated prior activity other (comment) (slid out od chair)  Follow Up  MD notified Sharion Settler PA  Time MD notified [place] notified Yes-comment (called pt at the time of the fall)  Time family notified 1918  Additional tests No  Progress note created (see row info) Yes  Adult Fall Risk Assessment  Risk Factor Category (scoring not indicated) High fall risk per protocol (document High fall risk)  [name] Fall Risk High Fall Risk (>13 points)  Adult Fall Risk Interventions  Required Bundle Interventions *See Row Information* High fall risk - low, moderate, and high requirements implemented  Additional Interventions Fall risk signage;Room near nurses station;Use of appropriate toileting equipment (bedpan, BSC, etc.)  Fall with Injury Screening  Risk For Fall Injury- See Row Information  Nurse judgement  Intervention(s) for 2 or more risk criteria identified Gait Belt;Low Bed  Vitals  Temp 98 F (36.7 C)  BP (!) 155/93 mmHg  BP Location Left Arm  BP Method Automatic  Patient Position (if appropriate) Sitting  Pulse Rate 85  Pulse Rate Source Dinamap  Resp 18  Oxygen Therapy  SpO2 100 %  O2 Device Room Air  Pain Assessment  Pain Assessment 0-10  Pain Score 9  Pain Type Acute pain  Pain Location Sacrum  Pain Descriptors / Indicators Aching;Discomfort  Pain Onset Gradual

## 2015-12-07 NOTE — Progress Notes (Signed)
Occupational Therapy Session Note  Patient Details  Name: Taylor Obrien MRN: [id number] Date of Birth: 1966/02/08  Today's Date: 12/07/2015 OT Individual Time: [id number] OT Individual Time Calculation (min): 75 min    Short Term Goals: Week 2:  OT Short Term Goal 1 (Week 2): STGs=LTGs secondary to estimated LOS  Skilled Therapeutic Interventions/Progress Updates:    Pt completed shower and dressing during session.  Mod assist for all squat pivot transfers with max assist for stand pivot from the shower.  Pt needs total assist for positioning and moving the [name].  Mod assist for standing in the shower with BUE support, however if attempting to wash his peri area in standing he needs max assist to maintain standing balance.  Therapist issued LH sponge for washing lower legs and feet secondary to decreased room available.  Max assist needed for crossing the [name] over the right knee when performing LB dressing tasks.  Mod assist for sit to stand when pulling brief and pants over hips.  Pt demonstrates very little active movement in the [name] with sit to stand and the leg tends to externally rotate as well.  He was able to complete grooming tasks in sitting with increased time and supervision.  He was able to also integrate the [name] throughout session but needed increased time and still exhibits motor apraxia when attempting to use for opening items or brushing his teeth.    Therapy Documentation Precautions:  Precautions Precautions: Fall Precaution Comments: [name]; watch BP; "alien" L UE Restrictions Weight Bearing Restrictions: No  Pain: Pain Assessment Pain Assessment: 0-10 Pain Score: 0-No pain Pain Type: Acute pain Pain Location: Head Pain Descriptors / Indicators: Aching Pain Intervention(s): Medication (See eMAR) ADL: ADL ADL Comments: see Functional Tool   See Function Navigator for Current Functional Status.   Therapy/Group: Individual Therapy  Anastasio Wogan  OTR/L 12/07/2015, 12:25 PM

## 2015-12-07 NOTE — Progress Notes (Signed)
Subjective/Complaints: No issues overnite, did not use condom cath, wife took him 3 times to BR ROS: Denies CP,SOB, N/V/D   Objective: Vital Signs: Blood pressure 161/91, pulse 67, temperature 98.2 F (36.8 C), temperature source Oral, resp. rate 18, SpO2 98 %. No results found. Results for orders placed or performed during the hospital encounter of 11/23/15 (from the past 72 hour(s))  Renal function panel     Status: Abnormal   Collection Time: 12/06/15  5:33 AM  Result Value Ref Range   Sodium 137 135 - 145 mmol/L   Potassium 3.4 (L) 3.5 - 5.1 mmol/L   Chloride 97 (L) 101 - 111 mmol/L   CO2 30 22 - 32 mmol/L   Glucose, Bld 124 (H) 65 - 99 mg/dL   BUN 20 6 - 20 mg/dL   Creatinine, Ser 1.58 (H) 0.61 - 1.24 mg/dL   Calcium 10.0 8.9 - 10.3 mg/dL   Phosphorus 5.4 (H) 2.5 - 4.6 mg/dL   Albumin 3.2 (L) 3.5 - 5.0 g/dL   GFR calc non Af Amer 51 (L) >60 mL/min   GFR calc Af Amer 59 (L) >60 mL/min    Comment: (NOTE) The eGFR has been calculated using the CKD EPI equation. This calculation has not been validated in all clinical situations. eGFR's persistently <60 mL/min signify possible Chronic Kidney Disease.    Anion gap 10 5 - 15    BP 161/91 mmHg  Pulse 67  Temp(Src) 98.2 F (36.8 C) (Oral)  Resp 18  SpO2 98% Gen NAD. Vital signs reviewed  HEENT:  Normocephalic, atraumatic Cardio: RRR and no murmur Resp: CTA B/L and unlabored GI: BS positive and NT, ND Musc/Skel:  No tenderness.  No Edema Neuro: Alert and Oriented Sensation Absent to light touch left upper and left lower extremity Motor: [name]: Shoulder abduction 4+/5, elbow flexion/extension 4+/5, finger grip 4/5 [name]: 0/5 RUE/RLE: 5/5 Skin:   Intact. Warm and dry.  Assessment/Plan: 1. Functional deficits secondary to Right ACA infarct with Left hemiparesis  which require 3+ hours per day of interdisciplinary therapy in a comprehensive inpatient rehab setting. Physiatrist is providing close team supervision and 24 hour  management of active medical problems listed below. Physiatrist and rehab team continue to assess barriers to discharge/monitor patient progress toward functional and medical goals. FIM: Function - Bathing Bathing activity did not occur: Refused Position: Shower Body parts bathed by patient: Right arm, Left arm, Chest, Abdomen, Front perineal area, Right upper leg, Left upper leg Body parts bathed by helper: Right lower leg, Left lower leg, Buttocks Bathing not applicable: Back Assist Level: Touching or steadying assistance(Pt > 75%)  Function- Upper Body Dressing/Undressing Upper body dressing/undressing activity did not occur: Refused What is the patient wearing?: Pull over shirt/dress Pull over shirt/dress - Perfomed by patient: Thread/unthread right sleeve, Thread/unthread left sleeve, Put head through opening, Pull shirt over trunk Pull over shirt/dress - Perfomed by helper: Pull shirt over trunk Assist Level: Touching or steadying assistance(Pt > 75%) Function - Lower Body Dressing/Undressing Lower body dressing/undressing activity did not occur: Refused What is the patient wearing?: Pants Position: Wheelchair/chair at sink Pants- Performed by patient: Thread/unthread right pants leg, Pull pants up/down, Fasten/unfasten pants Pants- Performed by helper: Thread/unthread left pants leg Non-skid slipper socks- Performed by patient: Don/doff right sock Non-skid slipper socks- Performed by helper: Don/doff right sock, Don/doff left sock Socks - Performed by helper: Don/doff right sock, Don/doff left sock Shoes - Performed by helper: Don/doff right shoe, Don/doff left shoe, Fasten right,  Fasten left Assist for footwear: Dependant Assist for lower body dressing: Assistive device  Function - Toileting Toileting activity did not occur: No continent bowel/bladder event Toileting steps completed by patient: Performs perineal hygiene, Adjust clothing prior to toileting, Adjust clothing  after toileting Toileting steps completed by helper: Adjust clothing prior to toileting, Performs perineal hygiene, Adjust clothing after toileting Toileting Assistive Devices: Grab bar or rail Assist level: Touching or steadying assistance (Pt.75%)  Function - Air cabin crew transfer activity did not occur: N/A Toilet transfer assistive device: Mechanical lift Mechanical lift: Stedy Assist level to toilet: Touching or steadying assistance (Pt > 75%) Assist level from toilet: Touching or steadying assistance (Pt > 75%)  Function - Chair/bed transfer Chair/bed transfer method: Squat pivot Chair/bed transfer assist level: Moderate assist (Pt 50 - 74%/lift or lower) Chair/bed transfer assistive device: Armrests Chair/bed transfer details: Verbal cues for precautions/safety, Verbal cues for technique  Function - Locomotion: Wheelchair Will patient use wheelchair at discharge?: Yes Type: Manual Max wheelchair distance: 150 Assist Level: Touching or steadying assistance (Pt > 75%) Assist Level: Touching or steadying assistance (Pt > 75%) Assist Level: Touching or steadying assistance (Pt > 75%) Turns around,maneuvers to table,bed, and toilet,negotiates 3% grade,maneuvers on rugs and over doorsills: No Function - Locomotion: Ambulation Assistive device: Rail in hallway Max distance: 25 Assist level: 2 helpers Walk 10 feet activity did not occur: Safety/medical concerns Assist level: 2 helpers Walk 50 feet with 2 turns activity did not occur: Safety/medical concerns Walk 150 feet activity did not occur: Safety/medical concerns Walk 10 feet on uneven surfaces activity did not occur: Safety/medical concerns  Function - Comprehension Comprehension: Auditory Comprehension assist level: Follows basic conversation/direction with extra time/assistive device  Function - Expression Expression: Verbal Expression assist level: Expresses complex 90% of the time/cues < 10% of the  time  Function - Social Interaction Social Interaction assist level: Interacts appropriately 75 - 89% of the time - Needs redirection for appropriate language or to initiate interaction.  Function - Problem Solving Problem solving assist level: Solves basic 25 - 49% of the time - needs direction more than half the time to initiate, plan or complete simple activities  Function - Memory Memory assist level: Recognizes or recalls 25 - 49% of the time/requires cueing 50 - 75% of the time Patient normally able to recall (first 3 days only): Current season, Location of own room, Staff names and faces, That he or she is in a hospital  Medical Problem List and Plan: 1.  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  DVT Prophylaxis/Anticoagulation: Mechanical: Sequential compression devices, below knee Bilateral lower extremities 3. Pain Management:  hydrocodone prn for pain 4. Mood: LCSW to follow for evaluation and support.      5. Neuropsych: This patient is not fully capable of making decisions on his own behalf. 6. Skin/Wound Care: Routine pressure relief measures. Maintain adequate nutrition and hydration status.   7. Fluids/Electrolytes/Nutrition: Monitor I/O. Appetite remains good.   Hypokalemia:  boderline low at 3.4 will start po supplementation 8.  HTN: 161/91 this am- but generally running in desired range  Norvasc, normodyne , chlorthalidone   Catapres prn additionally for SBP > 180 or DBP >105 as per Neurology parameters.  Nephro consulted, appreciate recs,     9. Dyslipidemia: On Lipitor.   10. H/o depression with anxiety: Used to see mental health in the past. Has been off meds for years. Start SSRI-  Celexa on 12/27  low dose xanax started, will wean no plans to d/c home on this 11. AKI/CKD: GFR 51 today, improving  Will recheck labs in am  12. OSA  CPAP at noc     LOS (Days) 14 A FACE TO FACE EVALUATION WAS  PERFORMED  Jansen Sciuto E 12/07/2015, 8:04 AM

## 2015-12-07 NOTE — Progress Notes (Signed)
Physical Therapy Session Note  Patient Details  Name: Taylor Obrien MRN: [id number] Date of Birth: 1966/06/08  Today's Date: 12/07/2015 PT Individual Time: 1400-1430 PT Individual Time Calculation (min): 30 min   Short Term Goals: Week 2:  PT Short Term Goal 1 (Week 2): Pt will consistently perform bed <> chair transfers with min A PT Short Term Goal 2 (Week 2): Pt will perform gait 20' with +2 assist PT Short Term Goal 3 (Week 2): Pt will demo standing balance for functional task with max A    Skilled Therapeutic Interventions/Progress Updates:   tx 1:  W/c propulsion using hemi method on level tile, room> gym.  VCs for lock bil brakes, and use R foot efficiently for turning, instead of backing up slightly when attempting to turn. neuromuscular re-education via forced use, visual feedback, manual cues for seated [name] mass extension from mass flexed position; muscle flicker felt [id number] reps, and gait using railing in hall.  Pt required max assist for advancement of [name], wt shifting, max VCs for upright trunk, forward gaze. No active extension detected [name].  Trial of Toe Off LAFO for L stance stability in parallel bars for wt shifting L><R x 20.  Pt has difficulty relaxing [name] on bar, limiting wt shift to L. L knee buckled with full L wt bearing.     PT returned pt to room; quick release belt donned. Wife present. PT instructed pt and wife in placing a towel roll next to L hip/thigh in w/c, to limit Lhip ER and sacral sitting which results from it. Safety plan updated regarding w/c positioning.  tx 2:  Wife using Stedy to transfer pt off toilet.  W/c propulsion on level tile in hall.  Up/down 6 3" high steps L rail, sideways up with max assist, down with +2 due to unsafe placemetn of R foot on step. Wife returned pt to room. Therapy Documentation Precautions:  Precautions Precautions: Fall Precaution Comments: [name]; watch BP; "alien" L UE Restrictions Weight Bearing Restrictions:  No Pain: Pain Assessment "Hurts a little bit" Pain Type: Acute pain Pain Location: Head Pain Descriptors / Indicators: Aching Pain Intervention(s): Medication (See eMAR)      See Function Navigator for Current Functional Status.   Therapy/Group: Individual Therapy  [name] 12/07/2015, 2:51 PM

## 2015-12-08 ENCOUNTER — Inpatient Hospital Stay (HOSPITAL_COMMUNITY): Payer: Medicaid Other

## 2015-12-08 ENCOUNTER — Inpatient Hospital Stay (HOSPITAL_COMMUNITY): Payer: Medicaid Other | Admitting: Speech Pathology

## 2015-12-08 ENCOUNTER — Inpatient Hospital Stay (HOSPITAL_COMMUNITY): Payer: Medicaid Other | Admitting: Occupational Therapy

## 2015-12-08 NOTE — Plan of Care (Signed)
Problem: RH PAIN MANAGEMENT Goal: RH STG PAIN MANAGED AT OR BELOW PT'S PAIN GOAL 3 or less  Outcome: Not Progressing Rate pain 6/10

## 2015-12-08 NOTE — Progress Notes (Signed)
Physical Therapy Session Note  Patient Details  Name: Taylor Obrien MRN: [id number] Date of Birth: 1966-02-26  Today's Date: 12/08/2015 PT Individual Time: 1132-1205; 1300-1350 PT Individual Time Calculation (min): 33 min , 50 min  Short Term Goals: Week 2:  PT Short Term Goal 1 (Week 2): Pt will consistently perform bed <> chair transfers with min A PT Short Term Goal 2 (Week 2): Pt will perform gait 20' with +2 assist PT Short Term Goal 3 (Week 2): Pt will demo standing balance for functional task with max A     Skilled Therapeutic Interventions/Progress Updates:  tx 1:  W/c propulsion using hemi method.  Pt initially tries to use bil UEs without [name], but is unable to steer.  Up 3% grade in hallway to [address] with min assist x 1, max VCs, down 3% grade with min assist x 5.  Pt unable to grasp concept/motor plan using R hand and foot to slow w/c down and use hand and foot to brake. Sit>stand using R hand on upright of Universal wt machine, min guard assist.  neuromuscular re-education via manual cues, VCs, for L knee control in standing, x 2 x 10.  Pt unable to elicit extension for 5-7 reps, then began to extend knee and able to repeat several times. Switched pt into 18' wide w/c, with basic cushion and back.  Discussed apartment accessibility; pt and wife state they would have to move to get into a handicap-accessible apt.  PT issued Home Measurements Form; 18" wide w/c is almost 27" wide; wife informed.  tx 2:  neuromuscular re-education via forced use, manual and VCs for standing in static stander for terminal hip ext, wt shifting R><L and midline orientation; also gait for L stance control, wt shifting, decreasing L back rotation of pelvis.  Family ed ongoing with squat pivot transfers w/c>< level mat to R and L, donning L AFO. Pt demonstrates safer transfers to L. Gait with hallway rail x 10' , L Toe Off AFO, transitioning to Taylor Obrien x 20' with wife steering it, and max/total assist for  [name] forward progression and L stance stability, wt shifting; max multimodal cues for upright trunk, forward gaze. Pt propelled w/c back to room with supervision, hemi method; wife with pt.     Therapy Documentation Precautions:  Precautions Precautions: Fall Precaution Comments: [name]; watch BP; "alien" L UE Restrictions Weight Bearing Restrictions: No Pain: Pain Assessment Pain Assessment: No/denies pain       See Function Navigator for Current Functional Status.   Therapy/Group: Individual Therapy  Taylor Obrien 12/08/2015, 12:09 PM

## 2015-12-08 NOTE — Progress Notes (Signed)
Speech Language Pathology Weekly Progress and Session Note  Patient Details  Name: Taylor Obrien MRN: [id number] Date of Birth: [birth year]  Beginning of progress report period: December 01, 2015   End of progress report period: December 08, 2015   Today's Date: 12/08/2015 SLP Individual Time: 0900-1000 SLP Individual Time Calculation (min): 60 min  Short Term Goals: Week 2: SLP Short Term Goal 1 (Week 2): Pt will locate call bell and demonstrate appropriate use with supervision cues in 75% of observable opportunities. SLP Short Term Goal 1 - Progress (Week 2): Met SLP Short Term Goal 2 (Week 2): pt will demonstrate functional problem solving for basic and familiar tasks with supervision cues.  SLP Short Term Goal 2 - Progress (Week 2): Met SLP Short Term Goal 3 (Week 2): Pt will utilize external aids to recall daily, functional information with min A verbal and visual cues.  SLP Short Term Goal 3 - Progress (Week 2): Progressing toward goal SLP Short Term Goal 4 (Week 2): Patient will demonstrate selective attention in a mildly distracting enviornment for 15 minutes with supervision cues for redirection.  SLP Short Term Goal 4 - Progress (Week 2): Met SLP Short Term Goal 5 (Week 2): Patient will attend to the left field of enviornment/[name] during functional tasks with supervision cues.  SLP Short Term Goal 5 - Progress (Week 2): Met    New Short Term Goals: Week 3: SLP Short Term Goal 1 (Week 3): Pt will demonstrate functional problem solving for semi-complex tasks with min assist verbal cues.  SLP Short Term Goal 2 (Week 3): Pt will utilize external aids to recall daily, functional information with min A verbal and visual cues.  SLP Short Term Goal 3 (Week 3): Patient will demonstrate selective attention in a mildly distracting enviornment for 45-60 minutes with supervision cues for redirection.  SLP Short Term Goal 4 (Week 3): Patient will attend to the left field of enviornment/[name]  during functional tasks with mod I   Weekly Progress Updates:  Pt has made functional gains and has met 4 out of 5 short term goals.  Pt requires mod assist for semi-complex tasks due to decreased selective attention to tasks, decreased emergent awareness of deficits, decreased functional problem solving, and decreased recall of new information.  Pt would continue to benefit from skilled ST while inpatient in order to maximize functional independence and reduce burden of care prior to discharge.  Pt and family education is ongoing.  Continue to anticipate that pt will need close 24/7 supervision at discharge due to cognitive deficits in addition to ST follow up at next level of care.     Intensity: Minumum of 1-2 x/day, 30 to 90 minutes Frequency: 3 to 5 out of 7 days Duration/Length of Stay: anticipated d/c date of 1/6 Treatment/Interventions: Cognitive remediation/compensation;Functional tasks;Oral motor exercises;Cueing hierarchy;Internal/external aids;Patient/family education;Medication managment;Speech/Language facilitation;Therapeutic Activities;Therapeutic Exercise;Environmental controls;Dysphagia/aspiration precaution training   Daily Session  Skilled Therapeutic Interventions: Pt was seen for skilled ST targeting cognitive goals.  Pt completed yesterday's medication management task with overall mod assist verbal cues to recognize and correct errors.  Pt demonstrated decreased carryover of function of medications from yesterday's therapy session; therefore, SLP again utilized spaced retrieval training to facilitate recall of daily information.  Pt was initially only able to recall 2 medications but after use of the abovementioned intervention, pt was able to recall ~75% of medications after a ~1-2 minute delay with min assist verbal cues.  Pt was returned to room and left  with call bell within reach.  Quick release belt donned for safety.  Goals updated on this date to reflect current progress  and plan of care.      Function:   Eating Eating                 Cognition Comprehension Comprehension assist level: Follows basic conversation/direction with extra time/assistive device  Expression   Expression assist level: Expresses complex 90% of the time/cues < 10% of the time  Social Interaction Social Interaction assist level: Interacts appropriately 75 - 89% of the time - Needs redirection for appropriate language or to initiate interaction.  Problem Solving Problem solving assist level: Solves basic 50 - 74% of the time/requires cueing 25 - 49% of the time  Memory Memory assist level: Recognizes or recalls 50 - 74% of the time/requires cueing 25 - 49% of the time   General    Pain Pain Assessment Pain Assessment: No/denies pain  Therapy/Group: Individual Therapy  Bettyann Birchler, [name] 12/08/2015, 12:39 PM

## 2015-12-08 NOTE — Progress Notes (Signed)
Occupational Therapy Session Note  Patient Details  Name: Taylor Obrien MRN: [id number] Date of Birth: [birth year]  Today's Date: 12/08/2015 OT Individual Time: 0800-0900 OT Individual Time Calculation (min): 60 min   Skilled Therapeutic Interventions/Progress Updates:    Pt donned shoes with mod assist overall with total assist for tying them.  Therapist attempted to get shoe buttons to try but none in stock at this time so hopefully can try later next week.  Had pt work on wheelchair mobility to the therapy gym with mod assist and max instructional cueing.  Pt continually pushing the wheelchair to the left side of the hall secondary to demonstrating decreased functional coordination and use in the [name].  Once in the gym had pt transfer to the therapy mat with max assist stand pivot.  Pt needing cueing to slow down with sit to stand transitions as he demonstrates increased impulsivity.  Worked on activation of the left trunk with incorporating the [name] for reciprical scooting tasks.  Pt needing max assist to complete as he performs all transitional movements with overuse of the right trunk and RLE especially.  Had pt transition to supine to work on activation of the abdominals for rolling to the right side.  He needed total assist for flexion the left knee but then could bring his head into flexion and reach across with the RUE to complete rolling.  Noted slight activation of the left glutes with bridging activity prior to rolling.  Transitioned back to sitting with min assist for therapist to bring the [name] off of the EOM.  Finished session with sit to stand and stand squat transitions.  Max assist needed for facilitation of the left knee into extension.  Slight tone noted in the hamstrings when attempting to assist with this.  Pt returned to room at end of session.  Therapist issued small pieces of foam for pt to work in picking up and releasing with the [name] as well as in-hand manipulation.    Therapy  Documentation Precautions:  Precautions Precautions: Fall Precaution Comments: [name]; watch BP; "alien" L UE Restrictions Weight Bearing Restrictions: No  Pain: Pain Assessment Pain Assessment: No/denies pain ADL: See Function Navigator for Current Functional Status.   Therapy/Group: Individual Therapy  Rayana Geurin OTR/L 12/08/2015, 11:37 AM

## 2015-12-08 NOTE — Progress Notes (Signed)
Subjective/Complaints: Occ spasms of [name], wife helped him stretch last noc ROS: Denies CP,SOB, N/V/D   Objective: Vital Signs: Blood pressure 155/88, pulse 65, temperature 98 F (36.7 C), temperature source Oral, resp. rate 16, SpO2 96 %. No results found. Results for orders placed or performed during the hospital encounter of 11/23/15 (from the past 72 hour(s))  Renal function panel     Status: Abnormal   Collection Time: 12/06/15  5:33 AM  Result Value Ref Range   Sodium 137 135 - 145 mmol/L   Potassium 3.4 (L) 3.5 - 5.1 mmol/L   Chloride 97 (L) 101 - 111 mmol/L   CO2 30 22 - 32 mmol/L   Glucose, Bld 124 (H) 65 - 99 mg/dL   BUN 20 6 - 20 mg/dL   Creatinine, Ser 1.58 (H) 0.61 - 1.24 mg/dL   Calcium 10.0 8.9 - 10.3 mg/dL   Phosphorus 5.4 (H) 2.5 - 4.6 mg/dL   Albumin 3.2 (L) 3.5 - 5.0 g/dL   GFR calc non Af Amer 51 (L) >60 mL/min   GFR calc Af Amer 59 (L) >60 mL/min    Comment: (NOTE) The eGFR has been calculated using the CKD EPI equation. This calculation has not been validated in all clinical situations. eGFR's persistently <60 mL/min signify possible Chronic Kidney Disease.    Anion gap 10 5 - 15    BP 155/88 mmHg  Pulse 65  Temp(Src) 98 F (36.7 C) (Oral)  Resp 16  SpO2 96% Gen NAD. Vital signs reviewed  HEENT:  Normocephalic, atraumatic Cardio: RRR and no murmur Resp: CTA B/L and unlabored GI: BS positive and NT, ND Musc/Skel:  No tenderness.  No Edema Neuro: Alert and Oriented Sensation Absent to light touch left upper and left lower extremity Motor: [name]: Shoulder abduction 4+/5, elbow flexion/extension 4+/5, finger grip 4/5 [name]: 0/5 RUE/RLE: 5/5 Skin:   Intact. Warm and dry.  Assessment/Plan: 1. Functional deficits secondary to Right ACA infarct with Left hemiparesis  which require 3+ hours per day of interdisciplinary therapy in a comprehensive inpatient rehab setting. Physiatrist is providing close team supervision and 24 hour management of active  medical problems listed below. Physiatrist and rehab team continue to assess barriers to discharge/monitor patient progress toward functional and medical goals. FIM: Function - Bathing Bathing activity did not occur: Refused Position: Shower Body parts bathed by patient: Right arm, Left arm, Chest, Abdomen, Front perineal area, Right upper leg, Left upper leg, Right lower leg, Left lower leg Body parts bathed by helper: Buttocks, Back Bathing not applicable: Back Assist Level: Touching or steadying assistance(Pt > 75%)  Function- Upper Body Dressing/Undressing Upper body dressing/undressing activity did not occur: Refused What is the patient wearing?: Pull over shirt/dress Pull over shirt/dress - Perfomed by patient: Thread/unthread right sleeve, Thread/unthread left sleeve, Put head through opening, Pull shirt over trunk Pull over shirt/dress - Perfomed by helper: Pull shirt over trunk Assist Level: Touching or steadying assistance(Pt > 75%) Function - Lower Body Dressing/Undressing Lower body dressing/undressing activity did not occur: Refused What is the patient wearing?: Pants, Socks, Shoes Position: Wheelchair/chair at [employer]- Performed by patient: Thread/unthread right pants leg Pants- Performed by helper: Thread/unthread left pants leg, Pull pants up/down Non-skid slipper socks- Performed by patient: Don/doff right sock Non-skid slipper socks- Performed by helper: Don/doff right sock, Don/doff left sock Socks - Performed by helper: Don/doff right sock, Don/doff left sock Shoes - Performed by patient: Don/doff right shoe Shoes - Performed by helper: Don/doff left shoe, Fasten  right, Fasten left Assist for footwear: Dependant Assist for lower body dressing: Assistive device  Function - Toileting Toileting activity did not occur: No continent bowel/bladder event Toileting steps completed by patient: Performs perineal hygiene, Adjust clothing prior to toileting, Adjust  clothing after toileting Toileting steps completed by helper: Adjust clothing prior to toileting, Performs perineal hygiene, Adjust clothing after toileting Toileting Assistive Devices: Grab bar or rail Assist level: Touching or steadying assistance (Pt.75%)  Function - Toilet Transfers Toilet transfer activity did not occur: N/A Toilet transfer assistive device: Mechanical lift Mechanical lift: Stedy Assist level to toilet: Touching or steadying assistance (Pt > 75%) Assist level from toilet: Touching or steadying assistance (Pt > 75%)  Function - Chair/bed transfer Chair/bed transfer method: Squat pivot Chair/bed transfer assist level: Moderate assist (Pt 50 - 74%/lift or lower) Chair/bed transfer assistive device: Armrests Chair/bed transfer details: Verbal cues for precautions/safety, Verbal cues for technique  Function - Locomotion: Wheelchair Will patient use wheelchair at discharge?: Yes Type: Manual Max wheelchair distance: 125 Assist Level: Supervision or verbal cues Assist Level: Supervision or verbal cues Assist Level: Touching or steadying assistance (Pt > 75%) Turns around,maneuvers to table,bed, and toilet,negotiates 3% grade,maneuvers on rugs and over doorsills: No Function - Locomotion: Ambulation Assistive device: Rail in hallway, Orthosis Max distance: 25 Assist level: Maximal assist (Pt 25 - 49%) Walk 10 feet activity did not occur: Safety/medical concerns Assist level: 2 helpers Walk 50 feet with 2 turns activity did not occur: Safety/medical concerns Walk 150 feet activity did not occur: Safety/medical concerns Walk 10 feet on uneven surfaces activity did not occur: Safety/medical concerns  Function - Comprehension Comprehension: Auditory Comprehension assist level: Follows basic conversation/direction with extra time/assistive device  Function - Expression Expression: Verbal Expression assist level: Expresses complex 90% of the time/cues < 10% of the  time  Function - Social Interaction Social Interaction assist level: Interacts appropriately 75 - 89% of the time - Needs redirection for appropriate language or to initiate interaction.  Function - Problem Solving Problem solving assist level: Solves basic 25 - 49% of the time - needs direction more than half the time to initiate, plan or complete simple activities  Function - Memory Memory assist level: Recognizes or recalls 25 - 49% of the time/requires cueing 50 - 75% of the time Patient normally able to recall (first 3 days only): Current season, Location of own room, Staff names and faces, That he or she is in a hospital  Medical Problem List and Plan: 1.  Left Hemiparesis secondary to Right ACA infarct,, ambulating max A with Rail ,  alien arm symptoms [name] improving 2.  DVT Prophylaxis/Anticoagulation: Mechanical: Sequential compression devices, below knee Bilateral lower extremities 3. Pain Management:  hydrocodone prn for pain 4. Mood: LCSW to follow for evaluation and support.      5. Neuropsych: This patient is not fully capable of making decisions on his own behalf. 6. Skin/Wound Care: Routine pressure relief measures. Maintain adequate nutrition and hydration status.   7. Fluids/Electrolytes/Nutrition: Monitor I/O. Appetite remains good.   Hypokalemia:  boderline low at 3.4 cont po supplementation 8.  HTN: 155/88 this am- but generally running higher in am, check orthostatics  Norvasc, normodyne , chlorthalidone   Catapres prn additionally for SBP > 180 or DBP >105 as per Neurology parameters.  Nephro consulted, appreciate recs,     9. Dyslipidemia: On Lipitor.   10. H/o depression with anxiety: Used to see mental health in the past. Has been off meds  for years. Start SSRI- Celexa on 12/27  low dose xanax started, will d/c 11. AKI/CKD: GFR 59 today, no need to recheck prior to d/c    12. OSA  CPAP at noc     LOS (Days) 15 A FACE TO FACE EVALUATION WAS  PERFORMED  KIRSTEINS,ANDREW E 12/08/2015, 7:27 AM

## 2015-12-09 ENCOUNTER — Inpatient Hospital Stay (HOSPITAL_COMMUNITY): Payer: Medicaid Other | Admitting: Physical Therapy

## 2015-12-09 ENCOUNTER — Inpatient Hospital Stay (HOSPITAL_COMMUNITY): Payer: Medicaid Other | Admitting: Occupational Therapy

## 2015-12-09 DIAGNOSIS — R300 Dysuria: Secondary | ICD-10-CM | POA: Insufficient documentation

## 2015-12-09 LAB — URINE MICROSCOPIC-ADD ON: BACTERIA UA: NONE SEEN

## 2015-12-09 LAB — URINALYSIS, ROUTINE W REFLEX MICROSCOPIC
Bilirubin Urine: NEGATIVE
GLUCOSE, UA: NEGATIVE mg/dL
HGB URINE DIPSTICK: NEGATIVE
Ketones, ur: NEGATIVE mg/dL
LEUKOCYTES UA: NEGATIVE
Nitrite: NEGATIVE
PROTEIN: 30 mg/dL — AB
SPECIFIC GRAVITY, URINE: 1.017 (ref 1.005–1.030)
pH: 5 (ref 5.0–8.0)

## 2015-12-09 NOTE — Progress Notes (Signed)
Pt complained of burning with urination, new onset. MD notified, orders given for urinalysis

## 2015-12-09 NOTE — Progress Notes (Signed)
Subjective/Complaints: Pt slept well overnight, but did not use his CPAP because it was making him too hot.  He also states his facial rash is improving.   ROS: Denies CP,SOB, N/V/D   Objective: Vital Signs: Blood pressure 127/82, pulse 74, temperature 98.7 F (37.1 C), temperature source Oral, resp. rate 18, SpO2 100 %. No results found. Results for orders placed or performed during the hospital encounter of 11/23/15 (from the past 72 hour(s))  Urinalysis, Routine w reflex microscopic (not at [facility])     Status: Abnormal   Collection Time: 12/09/15  3:17 PM  Result Value Ref Range   Color, Urine YELLOW YELLOW   APPearance CLEAR CLEAR   Specific Gravity, Urine 1.017 1.005 - 1.030   pH 5.0 5.0 - 8.0   Glucose, UA NEGATIVE NEGATIVE mg/dL   Hgb urine dipstick NEGATIVE NEGATIVE   Bilirubin Urine NEGATIVE NEGATIVE   Ketones, ur NEGATIVE NEGATIVE mg/dL   Protein, ur 30 (A) NEGATIVE mg/dL   Nitrite NEGATIVE NEGATIVE   Leukocytes, UA NEGATIVE NEGATIVE  Urine microscopic-add on     Status: Abnormal   Collection Time: 12/09/15  3:17 PM  Result Value Ref Range   Squamous Epithelial / LPF 0-5 (A) NONE SEEN   WBC, UA 0-5 0 - 5 WBC/hpf   RBC / HPF 0-5 0 - 5 RBC/hpf   Bacteria, UA NONE SEEN NONE SEEN    BP 127/82 mmHg  Pulse 74  Temp(Src) 98.7 F (37.1 C) (Oral)  Resp 18  SpO2 100% Gen NAD. Vital signs reviewed  HEENT:  Normocephalic, atraumatic Cardio: RRR and no murmur Resp: CTA B/L and unlabored GI: BS positive and NT, ND Musc/Skel:  No tenderness.  No Edema Neuro: Alert and Oriented Sensation Absent to light touch left upper and left lower extremity Motor: [name]: Shoulder abduction 4+/5, elbow flexion/extension 4+/5, finger grip 4/5 [name]: 0/5 RUE/RLE: Antigravity strength Skin:   Intact. Warm and dry.  Assessment/Plan: 1. Functional deficits secondary to Right ACA infarct with Left hemiparesis  which require 3+ hours per day of interdisciplinary therapy in a comprehensive  inpatient rehab setting. Physiatrist is providing close team supervision and 24 hour management of active medical problems listed below. Physiatrist and rehab team continue to assess barriers to discharge/monitor patient progress toward functional and medical goals. FIM: Function - Bathing Bathing activity did not occur: Refused Position: Shower Body parts bathed by patient: Right arm, Left arm, Chest, Abdomen, Front perineal area, Right upper leg, Left upper leg, Right lower leg, Left lower leg Body parts bathed by helper: Buttocks, Back Bathing not applicable: Back Assist Level: Touching or steadying assistance(Pt > 75%)  Function- Upper Body Dressing/Undressing Upper body dressing/undressing activity did not occur: Refused What is the patient wearing?: Pull over shirt/dress Pull over shirt/dress - Perfomed by patient: Thread/unthread right sleeve, Thread/unthread left sleeve, Put head through opening, Pull shirt over trunk Pull over shirt/dress - Perfomed by helper: Pull shirt over trunk Assist Level: Touching or steadying assistance(Pt > 75%) Function - Lower Body Dressing/Undressing Lower body dressing/undressing activity did not occur: Refused What is the patient wearing?: Pants, Socks, Shoes Position: Wheelchair/chair at [employer]- Performed by patient: Thread/unthread right pants leg Pants- Performed by helper: Thread/unthread left pants leg, Pull pants up/down Non-skid slipper socks- Performed by patient: Don/doff right sock Non-skid slipper socks- Performed by helper: Don/doff right sock, Don/doff left sock Socks - Performed by helper: Don/doff right sock, Don/doff left sock Shoes - Performed by patient: Don/doff right shoe Shoes - Performed by  helper: Don/doff left shoe, Fasten right, Fasten left Assist for footwear: Dependant Assist for lower body dressing: Assistive device  Function - Toileting Toileting activity did not occur: No continent bowel/bladder  event Toileting steps completed by patient: Performs perineal hygiene, Adjust clothing prior to toileting, Adjust clothing after toileting Toileting steps completed by helper: Adjust clothing prior to toileting, Performs perineal hygiene, Adjust clothing after toileting Toileting Assistive Devices: Grab bar or rail Assist level: Touching or steadying assistance (Pt.75%)  Function - Air cabin crew transfer activity did not occur: N/A Toilet transfer assistive device: Mechanical lift Mechanical lift: Stedy Assist level to toilet: Touching or steadying assistance (Pt > 75%) Assist level from toilet: Touching or steadying assistance (Pt > 75%)  Function - Chair/bed transfer Chair/bed transfer method: Squat pivot Chair/bed transfer assist level: Moderate assist (Pt 50 - 74%/lift or lower) Chair/bed transfer assistive device: Armrests Chair/bed transfer details: Verbal cues for precautions/safety, Verbal cues for technique  Function - Locomotion: Wheelchair Will patient use wheelchair at discharge?: Yes Type: Manual Max wheelchair distance: 150 Assist Level: Supervision or verbal cues Assist Level: Supervision or verbal cues Assist Level: Supervision or verbal cues Turns around,maneuvers to table,bed, and toilet,negotiates 3% grade,maneuvers on rugs and over doorsills: No Function - Locomotion: Ambulation Assistive device: Rail in hallway, Orthosis Max distance: 25 Assist level: Maximal assist (Pt 25 - 49%) Walk 10 feet activity did not occur: Safety/medical concerns Assist level: 2 helpers Walk 50 feet with 2 turns activity did not occur: Safety/medical concerns Walk 150 feet activity did not occur: Safety/medical concerns Walk 10 feet on uneven surfaces activity did not occur: Safety/medical concerns  Function - Comprehension Comprehension: Auditory Comprehension assist level: Follows basic conversation/direction with extra time/assistive device  Function -  Expression Expression: Verbal Expression assist level: Expresses complex 90% of the time/cues < 10% of the time  Function - Social Interaction Social Interaction assist level: Interacts appropriately 75 - 89% of the time - Needs redirection for appropriate language or to initiate interaction.  Function - Problem Solving Problem solving assist level: Solves basic 50 - 74% of the time/requires cueing 25 - 49% of the time  Function - Memory Memory assist level: Recognizes or recalls 50 - 74% of the time/requires cueing 25 - 49% of the time Patient normally able to recall (first 3 days only): Current season, Location of own room, Staff names and faces, That he or she is in a hospital  Medical Problem List and Plan: 1.  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  DVT Prophylaxis/Anticoagulation: Mechanical: Sequential compression devices, below knee Bilateral lower extremities 3. Pain Management:  hydrocodone prn for pain 4. Mood: LCSW to follow for evaluation and support.      5. Neuropsych: This patient is not fully capable of making decisions on his own behalf. 6. Skin/Wound Care: Routine pressure relief measures. Maintain adequate nutrition and hydration status.   7. Fluids/Electrolytes/Nutrition: Monitor I/O. Appetite remains good.   Hypokalemia:  boderline low at 3.4 cont po supplementation 8.  HTN:  Norvasc, normodyne , chlorthalidone   Catapres prn additionally for SBP > 180 or DBP >105 as per Neurology parameters.  Nephro consulted, appreciate recs  Fairly well controlled this AM 9. Dyslipidemia: On Lipitor.   10. H/o depression with anxiety: Used to see mental health in the past. Has been off meds for years.   Started SSRI- Celexa on 12/27  low dose xanax d/ced 11. AKI/CKD:  GFR 59 on last check, no need to recheck prior to d/c 12. OSA  CPAP at noc 13. Dysuria  UA, Ucx ordered   LOS (Days) 16 A FACE TO  FACE EVALUATION WAS PERFORMED  [name] 12/09/2015, 5:54 PM

## 2015-12-09 NOTE — Progress Notes (Addendum)
Physical Therapy Session Note  Patient Details  Name: Taylor Obrien MRN: [id number] Date of Birth: 26-Feb-1966  Today's Date: 12/09/2015 PT Individual Time: 1000-1030 PT Individual Time Calculation (min): 30 min   Short Term Goals: Week 1:  PT Short Term Goal 1 (Week 1): Pt will be able to demonstrate static sitting balance at midline with mod assist x 5 min PT Short Term Goal 1 - Progress (Week 1): Met PT Short Term Goal 2 (Week 1): Pt will be able to perform basic transfers on level surfaces with max assist of 1 PT Short Term Goal 2 - Progress (Week 1): Met PT Short Term Goal 3 (Week 1): Pt will be able to attend to [name] and [name] during transfers 50% of the time PT Short Term Goal 3 - Progress (Week 1): Met PT Short Term Goal 4 (Week 1): Pt will be able to propel w/c with hemitechnique x 50' with min assist PT Short Term Goal 4 - Progress (Week 1): Met Week 2:  PT Short Term Goal 1 (Week 2): Pt will consistently perform bed <> chair transfers with min A PT Short Term Goal 2 (Week 2): Pt will perform gait 20' with +2 assist PT Short Term Goal 3 (Week 2): Pt will demo standing balance for functional task with max A  Skilled Therapeutic Interventions/Progress Updates:    Pt demonstrates improvement in session requiring decreased assist for later transfer trials. Pt benefits most from cues for weight shift and activation of L lateral L/S flexors. Pt would continue to benefit from skilled PT services to increase functional mobility.  Therapy Documentation Precautions:  Precautions Precautions: Fall Precaution Comments: [name]; watch BP; "alien" L UE Restrictions Weight Bearing Restrictions: No Pain: Pain Assessment Pain Assessment: 0-10 Pain Score: 0-No pain Mobility:  Mod A for transfers with cues for weight shift, LE placement, core activation, and technique Other Treatments:  Pt educated on rehab plan, safety in mobility, core stability, and weight shift throughout. Pt  performs transfers x20 in session. Pt performs RUE reaching in sitting with [name] forced use 3x10. Pt performs standing RUE reaching with facilitation of L lateral trunk flexion 2x10. Pt performs Static standing 2'x2 with cues for L lateral trunk flexion. Partial sit to stands with [name] forced use performed x10.  See Function Navigator for Current Functional Status.   Therapy/Group: Individual Therapy  Monia Pouch 12/09/2015, 10:18 AM

## 2015-12-09 NOTE — Progress Notes (Signed)
Physical Therapy Session Note  Patient Details  Name: Taylor Obrien MRN: [id number] Date of Birth: 28-May-1966  Today's Date: 12/09/2015 PT Individual Time: 1135-1200 and [id number] PT Individual Time Calculation (min): 25 min and 28 min  Short Term Goals: Week 2:  PT Short Term Goal 1 (Week 2): Pt will consistently perform bed <> chair transfers with min A PT Short Term Goal 2 (Week 2): Pt will perform gait 20' with +2 assist PT Short Term Goal 3 (Week 2): Pt will demo standing balance for functional task with max A  Skilled Therapeutic Interventions/Progress Updates:   AM session: pt received in w/c with wife present.  Wife reports she has not practiced car transfer with pt.  Pt and wife agreeable to practice on car simulator.  Vitals assessed prior to beginning: 143/93, HR: 72 bpm, 99% within parameters.  In gym verbalized and demonstrated safe squat pivot sequence to patient and wife.  Pt performed transfer w/c <> car squat pivot with therapist x 2 reps with therapist providing mod A during pivot for [name] positioning and control, facilitation at trunk to maintain forward lean and weight shift during sit > squat and during full pivot + max verbal cues for sequencing (pt continues to attempt to stand or use momentum causing COG to shift posterior).  Pt and wife gave repeat demonstration x 2 reps with first attempt pt performing transfer too quickly and throwing wife off balance; re-emphasized with pt allowing wife to count and to initiate sit > squat and pivot in controlled manner.  After transfer training returned to room and pt set up for lunch.  PM session: pt received in room after toileting with wife.  Pt reporting headache this pm, denied need for pain medication.  Vitals assessed in gym: 139/90.  Pt reporting fatigue but willing to participate.  Pt transferred w/c < >Nustep squat pivot with mod A with continued need for therapist to provide [name] positioning and control, facilitation at trunk to  maintain forward lean and anterior weight shift.  On Nustep performed NMR; see below.  Returned to room in w/c with wife present and all items within reach.    Therapy Documentation Precautions:  Precautions Precautions: Fall Precaution Comments: [name]; watch BP; "alien" L UE Restrictions Weight Bearing Restrictions: No Vital Signs: Therapy Vitals Pulse Rate: 79 BP: 121/88 mmHg Pain: Pain Assessment Pain Assessment: 0-10 Pain Score: 0-No pain Other Treatments: Treatments Neuromuscular Facilitation: Right;Left;Lower Extremity;Forced use;Activity to increase coordination;Activity to increase motor control;Activity to increase timing and sequencing;Activity to increase grading;Activity to increase sustained activation;Activity to increase lateral weight shifting;Activity to increase anterior-posterior weight shifting on Nustep with LE only with focus on grading of RLE activation and sustained extension activation of [name] x 6 minutes at level 4 resistance.  Transitioned to squatting activity to facilitate WB and proprioceptive input through bilat UE and LE for increased activation and weight shifting with pt placing bilat UE on w/c arm rests in front of him and coming to squat position from Nustep seat and maintaining position with COG in midline x 2 reps with max A from therapist to maintain [name] position, [name] position and pelvis position in midline for activation of [name].     See Function Navigator for Current Functional Status.   Therapy/Group: Individual Therapy  [name] 12/09/2015, 1:06 PM

## 2015-12-09 NOTE — Progress Notes (Signed)
Occupational Therapy Session Note  Patient Details  Name: Taylor Obrien MRN: [id number] Date of Birth: 1966-03-17  Today's Date: 12/09/2015 OT Individual Time:  -   [id number]  (60 min)  1st session                                          1400-1500  (60 min)  2nd session      Short Term Goals: Week 1:  Week 2:  OT Short Term Goal 1 (Week 2): STGs=LTGs secondary to estimated LOS     Skilled Therapeutic Interventions/Progress Updates:   1st session:   Wife assisting pt to toilet using stedy.  Checked off.  Pt propelled wc to gym with min cues for not hitting on left side.  Pt transferred to mat with mod assist with lateral scooting to right.  Wife present during session.  Educated wife and pt on facilitating [name] exension movements.   Extension exercises with stop in law, tray passing and reaching performed with decreased elbow extension.  Transitioned to side lying with increased difficulty with segmental movements and control. Transferred to wc and propelled wc back to room.  2nd session:  Pt propelled wc to gym; transferred to mat with min assist and lateral scoots to left.  Engaged in [place] using yard stick for measuring length.  Pt reached 23 inches on left and 33 on right.  Pt reported it was difficult to reach and maintain eccentric control.  DId sit to stand x 5 with control and pushing with [name]  Completed holding arm in extension over cards for 5 seconds.  Pt. Had more difficulty crossing midline and holding than laterally.  Pt. Returned to room with all needs in reach.    Therapy Documentation Precautions:  Precautions Precautions: Fall Precaution Comments: [name]; watch BP; "alien" L UE Restrictions Weight Bearing Restrictions: No      Pain: Pain Assessment Pain Assessment: 0-10 Pain Score: Asleep Pain Type: Acute pain Pain Location: Head Pain Descriptors / Indicators: Headache Pain Onset: Gradual Pain Intervention(s): Medication (See eMAR) ADL: ADL ADL  Comments: see Functional Tool     See Function Navigator for Current Functional Status.   Therapy/Group: Individual Therapy  Lisa Roca 12/09/2015, 8:58 AM

## 2015-12-10 ENCOUNTER — Inpatient Hospital Stay (HOSPITAL_COMMUNITY): Payer: Medicaid Other | Admitting: Physical Therapy

## 2015-12-10 NOTE — Progress Notes (Signed)
RT note: Pt. placed on CPAP, tolerating well, nasal mask, room air, RT to monitor.

## 2015-12-10 NOTE — Progress Notes (Signed)
Physical Therapy Session Note  Patient Details  Name: Taylor Obrien MRN: [id number] Date of Birth: Jul 31, 1966  Today's Date: 12/10/2015 PT Individual Time: 1100-1200 PT Individual Time Calculation (min): 60 min   Short Term Goals: Week 2:  PT Short Term Goal 1 (Week 2): Pt will consistently perform bed <> chair transfers with min A PT Short Term Goal 2 (Week 2): Pt will perform gait 20' with +2 assist PT Short Term Goal 3 (Week 2): Pt will demo standing balance for functional task with max A  Skilled Therapeutic Interventions/Progress Updates:    Pt received up in w/c with wife present entire session. W/C Management - PT instructs pt in w/c propulsion with B UEs x 150' req Supervision and increased time for management of L UE. Pt req verbal cues and hand over hand assist to locate legrest release lever on each side, but pt is able to manage brakes with supervision. Neuromuscular Reeducation - PT places pt in // bars and instructs him in marching R LE up & down, while PT blocks L LE from buckling (L AFO in place) 2 x 10 reps - focus on forced use in weightbearing of L LE to elicit muscle activation. PT then places standing mirror in front of pt in // bars and instructs him in weight shifts to his L using mirror feedback - pt does well with hip weightshifts but leaves shoulders to the R. PT instructs pt in relaxing R elbow & shoulder and pt is briefly able to overcompensate weight shifting to his L. PT progresses this activity to do it with eyes closed so pt can begin to feel a weight shift L, progressed back to eyes open. Pt demonstrates much improved weight shift to L, but L knee slowly buckles/"melts" with good weight shifts and must be blocked by PT. PT instructs pt in B hip adduction in sit with PT maximally resisting R hip adduction in order to utilize neurologic overflow and is able to elicit L hip adduction muscle activation x 10 reps. Therapeutic Exercise - PT places pt on nu-step machine with L  hip guide attachment to prevent passive L hip ER at L3 x 10 minutes B LEs only with rest breaks taken prn. Pt is slowly progressing, evidenced by decreased pushing in sit & stand, improved squat-pivot transfers, and beginnings of L LE motor return. Pt ended up in w/c. Continue per PT POC.   Therapy Documentation Precautions:  Precautions Precautions: Fall Precaution Comments: [name]; watch BP; "alien" L UE Restrictions Weight Bearing Restrictions: No Pain: Pain Assessment Pain Assessment: No/denies pain   See Function Navigator for Current Functional Status.   Therapy/Group: Individual Therapy  Doak Mah M 12/10/2015, 11:03 AM

## 2015-12-10 NOTE — Progress Notes (Signed)
Subjective/Complaints: States he went to sleep at 9:00 last night and Mrs. Diboll drop. He is doing well, but complains of dysuria.   ROS: + Dysuria. Denies CP,SOB, N/V/D   Objective: Vital Signs: Blood pressure 142/86, pulse 67, temperature 98 F (36.7 C), temperature source Oral, resp. rate 20, SpO2 100 %. No results found. Results for orders placed or performed during the hospital encounter of 11/23/15 (from the past 72 hour(s))  Urinalysis, Routine w reflex microscopic (not at [facility])     Status: Abnormal   Collection Time: 12/09/15  3:17 PM  Result Value Ref Range   Color, Urine YELLOW YELLOW   APPearance CLEAR CLEAR   Specific Gravity, Urine 1.017 1.005 - 1.030   pH 5.0 5.0 - 8.0   Glucose, UA NEGATIVE NEGATIVE mg/dL   Hgb urine dipstick NEGATIVE NEGATIVE   Bilirubin Urine NEGATIVE NEGATIVE   Ketones, ur NEGATIVE NEGATIVE mg/dL   Protein, ur 30 (A) NEGATIVE mg/dL   Nitrite NEGATIVE NEGATIVE   Leukocytes, UA NEGATIVE NEGATIVE  Urine microscopic-add on     Status: Abnormal   Collection Time: 12/09/15  3:17 PM  Result Value Ref Range   Squamous Epithelial / LPF 0-5 (A) NONE SEEN   WBC, UA 0-5 0 - 5 WBC/hpf   RBC / HPF 0-5 0 - 5 RBC/hpf   Bacteria, UA NONE SEEN NONE SEEN    BP 142/86 mmHg  Pulse 67  Temp(Src) 98 F (36.7 C) (Oral)  Resp 20  SpO2 100% Gen NAD. Vital signs reviewed  HEENT:  Normocephalic, atraumatic Cardio: RRR and no murmur Resp: CTA B/L and unlabored GI: BS positive and NT, ND Musc/Skel:  No tenderness.  No Edema Neuro: Alert and Oriented Sensation Absent to light touch left upper and left lower extremity Motor: [name]: Shoulder abduction 4+/5, elbow flexion/extension 4+/5, finger grip 4 +/5 [name]: 0/5 RUE/RLE: Antigravity strength Skin:   Intact. Warm and dry.  Assessment/Plan: 1. Functional deficits secondary to Right ACA infarct with Left hemiparesis  which require 3+ hours per day of interdisciplinary therapy in a comprehensive inpatient rehab  setting. Physiatrist is providing close team supervision and 24 hour management of active medical problems listed below. Physiatrist and rehab team continue to assess barriers to discharge/monitor patient progress toward functional and medical goals. FIM: Function - Bathing Bathing activity did not occur: Refused Position: Shower Body parts bathed by patient: Right arm, Left arm, Chest, Abdomen, Front perineal area, Right upper leg, Left upper leg, Right lower leg, Left lower leg Body parts bathed by helper: Buttocks, Back Bathing not applicable: Back Assist Level: Touching or steadying assistance(Pt > 75%)  Function- Upper Body Dressing/Undressing Upper body dressing/undressing activity did not occur: Refused What is the patient wearing?: Pull over shirt/dress Pull over shirt/dress - Perfomed by patient: Thread/unthread right sleeve, Thread/unthread left sleeve, Put head through opening, Pull shirt over trunk Pull over shirt/dress - Perfomed by helper: Pull shirt over trunk Assist Level: Touching or steadying assistance(Pt > 75%) Function - Lower Body Dressing/Undressing Lower body dressing/undressing activity did not occur: Refused What is the patient wearing?: Pants, Socks, Shoes Position: Wheelchair/chair at [employer]- Performed by patient: Thread/unthread right pants leg Pants- Performed by helper: Thread/unthread left pants leg, Pull pants up/down Non-skid slipper socks- Performed by patient: Don/doff right sock Non-skid slipper socks- Performed by helper: Don/doff right sock, Don/doff left sock Socks - Performed by helper: Don/doff right sock, Don/doff left sock Shoes - Performed by patient: Don/doff right shoe Shoes - Performed by helper: Don/doff  left shoe, Fasten right, Fasten left Assist for footwear: Dependant Assist for lower body dressing: Assistive device  Function - Toileting Toileting activity did not occur: No continent bowel/bladder event Toileting steps  completed by patient: Performs perineal hygiene, Adjust clothing prior to toileting, Adjust clothing after toileting Toileting steps completed by helper: Adjust clothing prior to toileting, Performs perineal hygiene, Adjust clothing after toileting Toileting Assistive Devices: Grab bar or rail Assist level: Touching or steadying assistance (Pt.75%)  Function - Toilet Transfers Toilet transfer activity did not occur: N/A Toilet transfer assistive device: Mechanical lift Mechanical lift: Stedy Assist level to toilet: Touching or steadying assistance (Pt > 75%) Assist level from toilet: Touching or steadying assistance (Pt > 75%)  Function - Chair/bed transfer Chair/bed transfer method: Squat pivot Chair/bed transfer assist level: Moderate assist (Pt 50 - 74%/lift or lower) Chair/bed transfer assistive device: Armrests Chair/bed transfer details: Verbal cues for precautions/safety, Verbal cues for technique  Function - Locomotion: Wheelchair Will patient use wheelchair at discharge?: Yes Type: Manual Max wheelchair distance: 150 Assist Level: Supervision or verbal cues Assist Level: Supervision or verbal cues Assist Level: Supervision or verbal cues Turns around,maneuvers to table,bed, and toilet,negotiates 3% grade,maneuvers on rugs and over doorsills: No Function - Locomotion: Ambulation Assistive device: Rail in hallway, Orthosis Max distance: 25 Assist level: Maximal assist (Pt 25 - 49%) Walk 10 feet activity did not occur: Safety/medical concerns Assist level: 2 helpers Walk 50 feet with 2 turns activity did not occur: Safety/medical concerns Walk 150 feet activity did not occur: Safety/medical concerns Walk 10 feet on uneven surfaces activity did not occur: Safety/medical concerns  Function - Comprehension Comprehension: Auditory Comprehension assist level: Follows basic conversation/direction with extra time/assistive device  Function - Expression Expression:  Verbal Expression assist level: Expresses complex 90% of the time/cues < 10% of the time  Function - Social Interaction Social Interaction assist level: Interacts appropriately 75 - 89% of the time - Needs redirection for appropriate language or to initiate interaction.  Function - Problem Solving Problem solving assist level: Solves basic 50 - 74% of the time/requires cueing 25 - 49% of the time  Function - Memory Memory assist level: Recognizes or recalls 50 - 74% of the time/requires cueing 25 - 49% of the time Patient normally able to recall (first 3 days only): Current season, Location of own room, Staff names and faces, That he or she is in a hospital  Medical Problem List and Plan: 1.  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  DVT Prophylaxis/Anticoagulation: Mechanical: Sequential compression devices, below knee Bilateral lower extremities 3. Pain Management:  hydrocodone prn for pain 4. Mood: LCSW to follow for evaluation and support.      5. Neuropsych: This patient is not fully capable of making decisions on his own behalf. 6. Skin/Wound Care: Routine pressure relief measures. Maintain adequate nutrition and hydration status.   7. Fluids/Electrolytes/Nutrition: Monitor I/O. Appetite remains good.   Hypokalemia:  boderline low at 3.4 cont po supplementation 8.  HTN:  Norvasc, normodyne , chlorthalidone   Catapres prn additionally for SBP > 180 or DBP >105 as per Neurology parameters.  Nephro consulted, appreciate recs  Slightly elevated this a.m. otherwise fairly well controlled yesterday   Will continue to monitor  9. Dyslipidemia: On Lipitor.   10. H/o depression with anxiety: Used to see mental health in the past. Has been off meds for years.   Started SSRI- Celexa  on 12/27  low dose xanax d/ced 11. AKI/CKD: GFR 59 on last check, no need to recheck prior to d/c 12. OSA  CPAP at noc 13.  Dysuria  UA relatively unremarkable for infection  Ucx pending   LOS (Days) 17 A FACE TO FACE EVALUATION WAS PERFORMED  Taylor Obrien 12/10/2015, 9:06 AM

## 2015-12-11 ENCOUNTER — Inpatient Hospital Stay (HOSPITAL_COMMUNITY): Payer: Medicaid Other | Admitting: Speech Pathology

## 2015-12-11 ENCOUNTER — Inpatient Hospital Stay (HOSPITAL_COMMUNITY): Payer: Medicaid Other | Admitting: Occupational Therapy

## 2015-12-11 ENCOUNTER — Inpatient Hospital Stay (HOSPITAL_COMMUNITY): Payer: Medicaid Other

## 2015-12-11 LAB — URINE CULTURE

## 2015-12-11 MED ORDER — SULFAMETHOXAZOLE-TRIMETHOPRIM 800-160 MG PO TABS
1.0000 | ORAL_TABLET | Freq: Two times a day (BID) | ORAL | Status: DC
  Administered 2015-12-11 – 2015-12-16 (×12): 1 via ORAL
  Filled 2015-12-11 (×15): qty 1

## 2015-12-11 NOTE — Progress Notes (Signed)
Social Work Patient ID: Taylor Obrien, male   DOB: March 06, 1966, 50 y.o.   MRN: [id number] Met with pt to give him MD and medical information for wife to assist with the SSD application. Have contacted wife but no voice mail and contacted home phone with no answer. Pt reports wife will be in tomorrow, will talk with her then and provide other information if needed.

## 2015-12-11 NOTE — Progress Notes (Addendum)
Speech Language Pathology Daily Session Notes  Patient Details  Name: Taylor Obrien MRN: [id number] Date of Birth: September 13, 1966  Today's Date: 12/11/2015  Session 1 SLP Individual Time: 0900-0930 SLP Individual Time Calculation (min): 30 min Session 2 SLP Individual Time: 1001-1030 SLP Individual Time Calculation (min): 29 min  Short Term Goals: Week 3: SLP Short Term Goal 1 (Week 3): Pt will demonstrate functional problem solving for semi-complex tasks with min assist verbal cues.  SLP Short Term Goal 2 (Week 3): Pt will utilize external aids to recall daily, functional information with min A verbal and visual cues.  SLP Short Term Goal 3 (Week 3): Patient will demonstrate selective attention in a mildly distracting enviornment for 45-60 minutes with supervision cues for redirection.  SLP Short Term Goal 4 (Week 3): Patient will attend to the left field of enviornment/[name] during functional tasks with mod I   Skilled Therapeutic Interventions: Session 1 Skilled treatment session focused on addressing cognition goals. SLP facilitated session by providing Mod verbal cues to increase patient's attention to left upper extremity while propelling wheelchair from room to speech office.  Patient required Supervision cues to recall location.  SLP also facilitated session with semi-complex instructions, to locate low sodium diet restrictions; patient required Mod assist verbal and visual cues to problem solve use of Internet and search browsers.  Session was ended with SLP asking patient to think of 3 different breakfast, lunch and dinner meal plans.   Session 2 Skilled treatment session focused on continuation of semi-complex planning task from earlier session.  After mental menu planning, patient with appropriate questions regarding if recipes were low sodium and required Mod assist cues to search for information.  Patient required Mod cues to attend to and utilize left upper extremity during typing.   Continue with current plan of care.   Function:  Eating Eating                 Cognition Comprehension Comprehension assist level: Follows basic conversation/direction with extra time/assistive device  Expression   Expression assist level: Expresses complex 90% of the time/cues < 10% of the time  Social Interaction Social Interaction assist level: Interacts appropriately 90% of the time - Needs monitoring or encouragement for participation or interaction.  Problem Solving Problem solving assist level: Solves basic 50 - 74% of the time/requires cueing 25 - 49% of the time  Memory Memory assist level: Recognizes or recalls 50 - 74% of the time/requires cueing 25 - 49% of the time    Pain Pain Assessment Pain Assessment: No/denies pain x2  Therapy/Group: Individual Therapy x2  Carmelia Roller., [place] [id number]  [place] 12/11/2015, 12:47 PM

## 2015-12-11 NOTE — Progress Notes (Signed)
Physical Therapy Session Note  Patient Details  Name: Taylor Obrien MRN: [id number] Date of Birth: 1965/12/16  Today's Date: 12/11/2015 PT Individual Time: 1430-1530 PT Individual Time Calculation (min): 60 min   Short Term Goals: Week 2:  PT Short Term Goal 1 (Week 2): Pt will consistently perform bed <> chair transfers with min A PT Short Term Goal 2 (Week 2): Pt will perform gait 20' with +2 assist PT Short Term Goal 3 (Week 2): Pt will demo standing balance for functional task with max A  Skilled Therapeutic Interventions/Progress Updates:    Session focused on neuro re-ed to address postural control, balance, functional use and weightbearing through [name]/[name], and weightshifting, functional transfer training with squat pivot technique and emphasis on pt directing transfer (mod assist for transfer), and w/c propulsion training using RLE and BUE at supervision level with extra time and verbal cues for efficiency. Pt able to gait with hallway rail on R and L AFO with max assist for facilitation for weightshifting, blocking/control at L knee in stance, and weighshifting x 20' with +2 for w/c follow for safety. Pt requires cues throughout for safety during gait activity. For neuro re-ed in standing to address the above mentioned, pt engaged in [employer] (which he thoroughly enjoyed!) with min to max assist for balance, blocking and positioning of L knee, and weighshifting for maintaining midline/postural control during activity. Seated rest breaks as needed. End of session positioned in w/c with call bell and all needs in reach.   Therapy Documentation Precautions:  Precautions Precautions: Fall Precaution Comments: flaccid [name] and motor planning difficulties with the [name] Restrictions Weight Bearing Restrictions: No    Pain: Pain Assessment Pain Assessment: No/denies pain   See Function Navigator for Current Functional Status.   Therapy/Group: Individual Therapy  Taylor Obrien  [name], PT, DPT  12/11/2015, 3:49 PM

## 2015-12-11 NOTE — Progress Notes (Signed)
Patient placed on Auto CPAP. Patient tolerating well. No O2 bleed in needed. RT will continue to monitor as needed.

## 2015-12-11 NOTE — Progress Notes (Signed)
Speech Language Pathology Daily Session Note  Patient Details  Name: Taylor Obrien MRN: [id number] Date of Birth: 1966-08-18  Today's Date: 12/11/2015 SLP Individual Time: 1400-1430 SLP Individual Time Calculation (min): 30 min  Short Term Goals: Week 3: SLP Short Term Goal 1 (Week 3): Pt will demonstrate functional problem solving for semi-complex tasks with min assist verbal cues.  SLP Short Term Goal 2 (Week 3): Pt will utilize external aids to recall daily, functional information with min A verbal and visual cues.  SLP Short Term Goal 3 (Week 3): Patient will demonstrate selective attention in a mildly distracting enviornment for 45-60 minutes with supervision cues for redirection.  SLP Short Term Goal 4 (Week 3): Patient will attend to the left field of enviornment/[name] during functional tasks with mod I   Skilled Therapeutic Interventions: Skilled treatment session focused on cognitive goals. SLP facilitated session by providing Mod A verbal and question cues for problem solving during a novel task. Patient also required Mod A verbal cues for use of [name] throughout task and was Mod I for selective attention in a moderately distracting environment for 20 minutes. Patient left upright in wheelchair with all needs within reach. Continue with current plan of care.    Function:  Cognition Comprehension Comprehension assist level: Follows basic conversation/direction with extra time/assistive device  Expression   Expression assist level: Expresses complex 90% of the time/cues < 10% of the time  Social Interaction Social Interaction assist level: Interacts appropriately 90% of the time - Needs monitoring or encouragement for participation or interaction.  Problem Solving Problem solving assist level: Solves basic 50 - 74% of the time/requires cueing 25 - 49% of the time  Memory Memory assist level: Recognizes or recalls 50 - 74% of the time/requires cueing 25 - 49% of the time    Pain Pain  Assessment Pain Assessment: No/denies pain  Therapy/Group: Individual Therapy  Annmarie Plemmons 12/11/2015, 3:59 PM

## 2015-12-11 NOTE — Progress Notes (Signed)
Occupational Therapy Session Note  Patient Details  Name: Taylor Obrien MRN: [id number] Date of Birth: 08-17-1966  Today's Date: 12/11/2015 OT Individual Time: 1103-1200 OT Individual Time Calculation (min): 57 min    Short Term Goals: Week 1:  OT Short Term Goal 1 (Week 1): Pt will complete upper body dressing sitting at EOB with mod assist OT Short Term Goal 1 - Progress (Week 1): Met OT Short Term Goal 2 (Week 1): Pt will transfer from w/c to drop-arm commode with max assist OT Short Term Goal 2 - Progress (Week 1): Met OT Short Term Goal 3 (Week 1): Pt will complete 3 of 5 grooming tasks with min cues to sequence OT Short Term Goal 3 - Progress (Week 1): Met OT Short Term Goal 4 (Week 1): Pt will demo improved attention to left as evidenced by ability to locate supplies needed for BADL with min vc OT Short Term Goal 4 - Progress (Week 1): Met OT Short Term Goal 5 (Week 1): Pt will bathe seated at sink with mod assist to maintain supported standing while washing buttocks. OT Short Term Goal 5 - Progress (Week 1): Met  Skilled Therapeutic Interventions/Progress Updates:    Pt worked on functional wheelchair mobility using both UEs to propel chair.  Mod assist overall as pt demonstrates difficulty coordinating use of both the RUE and [name] at the same time.  Decreased efficiency with use of the [name] noted.  Practiced tub shower transfers in the ADL apartment using the tub shower bench.  Mod assist for completion of this including therapist assist to lift the [name] into and out of the tub.  Discussed use of a hand held shower as well, which wife will have to purchase.  Transitioned to use of the UE ergonometer for [name] coordination.  Had pt perform 3 intervals of 4 mins with isolated use to the [name] only.  At times pt with decreased ability to coordinate movement efficiently but maintained RPMs at 15-24 in both forward and reverse direction.  After completion and transfer back to the wheelchair with  mod assist, pt was rolled back to his room where he was left in the wheelchair with safety belt in place.    Therapy Documentation Precautions:  Precautions Precautions: Fall Precaution Comments: flaccid [name] and motor planning difficulties with the [name] Restrictions Weight Bearing Restrictions: No  Pain: Pain Assessment Pain Assessment: No/denies pain ADL: See Function Navigator for Current Functional Status.   Therapy/Group: Individual Therapy  Daishaun Ayre OTR/L 12/11/2015, 12:33 PM

## 2015-12-11 NOTE — Progress Notes (Signed)
Subjective/Complaints: Pt had an episode of burning with urination, states he saw blood in urine.  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 Denies CP,SOB, N/V/D   Objective: Vital Signs: Blood pressure 165/89, pulse 67, temperature 98.4 F (36.9 C), temperature source Oral, resp. rate 18, SpO2 99 %. No results found. Results for orders placed or performed during the hospital encounter of 11/23/15 (from the past 72 hour(s))  Urinalysis, Routine w reflex microscopic (not at [facility])     Status: Abnormal   Collection Time: 12/09/15  3:17 PM  Result Value Ref Range   Color, Urine YELLOW YELLOW   APPearance CLEAR CLEAR   Specific Gravity, Urine 1.017 1.005 - 1.030   pH 5.0 5.0 - 8.0   Glucose, UA NEGATIVE NEGATIVE mg/dL   Hgb urine dipstick NEGATIVE NEGATIVE   Bilirubin Urine NEGATIVE NEGATIVE   Ketones, ur NEGATIVE NEGATIVE mg/dL   Protein, ur 30 (A) NEGATIVE mg/dL   Nitrite NEGATIVE NEGATIVE   Leukocytes, UA NEGATIVE NEGATIVE  Urine microscopic-add on     Status: Abnormal   Collection Time: 12/09/15  3:17 PM  Result Value Ref Range   Squamous Epithelial / LPF 0-5 (A) NONE SEEN   WBC, UA 0-5 0 - 5 WBC/hpf   RBC / HPF 0-5 0 - 5 RBC/hpf   Bacteria, UA NONE SEEN NONE SEEN  Culture, Urine     Status: None (Preliminary result)   Collection Time: 12/10/15  3:29 PM  Result Value Ref Range   Specimen Description URINE, RANDOM    Special Requests NONE    Culture NO GROWTH < 24 HOURS    Report Status PENDING     BP 165/89 mmHg  Pulse 67  Temp(Src) 98.4 F (36.9 C) (Oral)  Resp 18  SpO2 99% Gen NAD. Vital signs reviewed  HEENT:  Normocephalic, atraumatic Cardio: RRR and no murmur Resp: CTA B/L and unlabored GI: BS positive and NT, ND Musc/Skel:  No tenderness.  No Edema Neuro: Alert and Oriented Sensation Absent to light touch left upper and left lower extremity Motor: [name]: Shoulder abduction 4+/5, elbow flexion/extension 4+/5, finger grip 4  +/5 [name]: 0/5 RUE/RLE: Antigravity strength Skin:   Intact. Warm and dry.  Assessment/Plan: 1. Functional deficits secondary to Right ACA infarct with Left hemiparesis  which require 3+ hours per day of interdisciplinary therapy in a comprehensive inpatient rehab setting. Physiatrist is providing close team supervision and 24 hour management of active medical problems listed below. Physiatrist and rehab team continue to assess barriers to discharge/monitor patient progress toward functional and medical goals. FIM: Function - Bathing Bathing activity did not occur: Refused Position: Shower Body parts bathed by patient: Right arm, Left arm, Chest, Abdomen, Front perineal area, Right upper leg, Left upper leg, Right lower leg, Left lower leg Body parts bathed by helper: Buttocks, Back Bathing not applicable: Back Assist Level: Touching or steadying assistance(Pt > 75%)  Function- Upper Body Dressing/Undressing Upper body dressing/undressing activity did not occur: Refused What is the patient wearing?: Pull over shirt/dress Pull over shirt/dress - Perfomed by patient: Thread/unthread right sleeve, Thread/unthread left sleeve, Put head through opening, Pull shirt over trunk Pull over shirt/dress - Perfomed by helper: Pull shirt over trunk Assist Level: Touching or steadying assistance(Pt > 75%) Function - Lower Body Dressing/Undressing Lower body dressing/undressing activity did not occur: Refused What is the patient wearing?: Pants, Socks, Shoes Position: Wheelchair/chair at sink Pants- Performed by patient: Thread/unthread right pants  leg Pants- Performed by helper: Thread/unthread left pants leg, Pull pants up/down Non-skid slipper socks- Performed by patient: Don/doff right sock Non-skid slipper socks- Performed by helper: Don/doff right sock, Don/doff left sock Socks - Performed by helper: Don/doff right sock, Don/doff left sock Shoes - Performed by patient: Don/doff right shoe Shoes  - Performed by helper: Don/doff left shoe, Fasten right, Fasten left Assist for footwear: Dependant Assist for lower body dressing: Assistive device  Function - Toileting Toileting activity did not occur: No continent bowel/bladder event Toileting steps completed by patient: Performs perineal hygiene, Adjust clothing prior to toileting, Adjust clothing after toileting Toileting steps completed by helper: Adjust clothing prior to toileting, Performs perineal hygiene, Adjust clothing after toileting Toileting Assistive Devices: Grab bar or rail Assist level: Touching or steadying assistance (Pt.75%)  Function - Air cabin crew transfer activity did not occur: N/A Toilet transfer assistive device: Mechanical lift Mechanical lift: Stedy Assist level to toilet: Touching or steadying assistance (Pt > 75%) Assist level from toilet: Touching or steadying assistance (Pt > 75%)  Function - Chair/bed transfer Chair/bed transfer method: Squat pivot Chair/bed transfer assist level: Moderate assist (Pt 50 - 74%/lift or lower) Chair/bed transfer assistive device: Armrests Chair/bed transfer details: Verbal cues for precautions/safety, Verbal cues for technique, Manual facilitation for placement, Manual facilitation for weight bearing, Verbal cues for sequencing  Function - Locomotion: Wheelchair Will patient use wheelchair at discharge?: Yes Type: Manual Max wheelchair distance: 150' Assist Level: Supervision or verbal cues Assist Level: Supervision or verbal cues Assist Level: Supervision or verbal cues Turns around,maneuvers to table,bed, and toilet,negotiates 3% grade,maneuvers on rugs and over doorsills: No Function - Locomotion: Ambulation Assistive device: Rail in hallway, Orthosis Max distance: 25 Assist level: Maximal assist (Pt 25 - 49%) Walk 10 feet activity did not occur: Safety/medical concerns Assist level: 2 helpers Walk 50 feet with 2 turns activity did not occur:  Safety/medical concerns Walk 150 feet activity did not occur: Safety/medical concerns Walk 10 feet on uneven surfaces activity did not occur: Safety/medical concerns  Function - Comprehension Comprehension: Auditory Comprehension assist level: Follows basic conversation/direction with extra time/assistive device  Function - Expression Expression: Verbal Expression assist level: Expresses complex 90% of the time/cues < 10% of the time  Function - Social Interaction Social Interaction assist level: Interacts appropriately 75 - 89% of the time - Needs redirection for appropriate language or to initiate interaction.  Function - Problem Solving Problem solving assist level: Solves basic 50 - 74% of the time/requires cueing 25 - 49% of the time  Function - Memory Memory assist level: Recognizes or recalls 50 - 74% of the time/requires cueing 25 - 49% of the time Patient normally able to recall (first 3 days only): Current season, Location of own room, Staff names and faces, That he or she is in a hospital  Medical Problem List and Plan: 1.  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  DVT Prophylaxis/Anticoagulation: Mechanical: Sequential compression devices, below knee Bilateral lower extremities 3. Pain Management:  hydrocodone prn for pain 4. Mood: LCSW to follow for evaluation and support.      5. Neuropsych: This patient is not fully capable of making decisions on his own behalf. 6. Skin/Wound Care: Routine pressure relief measures. Maintain adequate nutrition and hydration status.   7. Fluids/Electrolytes/Nutrition: Monitor I/O. Appetite remains good.   Hypokalemia:  boderline low at 3.4 cont po supplementation 8.  HTN:  Norvasc,  normodyne , chlorthalidone at max doses- reluctant to add ACE or diuretic givne recent AKI  Catapres prn additionally for SBP > 180 or DBP >105 as per Neurology parameters.  Nephro  consulted, appreciate recs  Systolic  [id number]  Will continue to monitor  9. Dyslipidemia: On Lipitor.   10. H/o depression with anxiety: Used to see mental health in the past. Has been off meds for years.   Started SSRI- Celexa on 12/27  low dose xanax d/ced 11. AKI/CKD: GFR 59 on last check, no need to recheck prior to d/c 12. OSA  CPAP at noc 13. Dysuria-hx of prostatitis- urine is neg which would be consistent , given hx will tx with bactrim x 2 wks     LOS (Days) 18 A FACE TO FACE EVALUATION WAS PERFORMED  KIRSTEINS,ANDREW E 12/11/2015, 8:39 AM

## 2015-12-12 ENCOUNTER — Inpatient Hospital Stay (HOSPITAL_COMMUNITY): Payer: Medicaid Other | Admitting: *Deleted

## 2015-12-12 ENCOUNTER — Inpatient Hospital Stay (HOSPITAL_COMMUNITY): Payer: Medicaid Other | Admitting: Physical Therapy

## 2015-12-12 ENCOUNTER — Inpatient Hospital Stay (HOSPITAL_COMMUNITY): Payer: Medicaid Other | Admitting: Occupational Therapy

## 2015-12-12 ENCOUNTER — Inpatient Hospital Stay (HOSPITAL_COMMUNITY): Payer: Medicaid Other | Admitting: Speech Pathology

## 2015-12-12 NOTE — Progress Notes (Signed)
Recreational Therapy Session Note  Patient Details  Name: Taylor Obrien MRN: [id number] Date of Birth: December 15, 1965 Today's Date: 12/12/2015  Pain: no c/o Skilled Therapeutic Interventions/Progress Updates: Session focused on education about use of leisure time post discharge and making healthy lifestyle changes.  Pt states intentions of "not smoking & drinking" at discharge.  Pt also states interest & concern about intimacy and sexual intercourse.  Discussion with pt about the above and informed RN & PA of pt's questions and concerns.  Pt stated appreciation   Therapy/Group: Individual Therapy   Laurelle Skiver 12/12/2015, 3:49 PM

## 2015-12-12 NOTE — Progress Notes (Signed)
Occupational Therapy Session Note  Patient Details  Name: Taylor Obrien MRN: [id number] Date of Birth: 1966-06-14  Today's Date: 12/12/2015 OT Individual Time: 0800-0900 OT Individual Time Calculation (min): 60 min    Short Term Goals: Week 1:  OT Short Term Goal 1 (Week 1): Pt will complete upper body dressing sitting at EOB with mod assist OT Short Term Goal 1 - Progress (Week 1): Met OT Short Term Goal 2 (Week 1): Pt will transfer from w/c to drop-arm commode with max assist OT Short Term Goal 2 - Progress (Week 1): Met OT Short Term Goal 3 (Week 1): Pt will complete 3 of 5 grooming tasks with min cues to sequence OT Short Term Goal 3 - Progress (Week 1): Met OT Short Term Goal 4 (Week 1): Pt will demo improved attention to left as evidenced by ability to locate supplies needed for BADL with min vc OT Short Term Goal 4 - Progress (Week 1): Met OT Short Term Goal 5 (Week 1): Pt will bathe seated at sink with mod assist to maintain supported standing while washing buttocks. OT Short Term Goal 5 - Progress (Week 1): Met  Skilled Therapeutic Interventions/Progress Updates:    Pt completed bathing, grooming, and dressing tasks during session.  Mod assist for transfers from wheelchair to the walk-in shower.  Supervision for all bathing except for washing peri anal, which he needed mod assist to maintain standing balance with BUE support.  Max assist for him letting go with the RUE when washing.  Max facilitation at the left knee for support in standing as well.  He needs max assist for maintaining the [name] over the right knee for dressing tasks.  Pt continues to demonstrate impulsivity with sit to stand, requiring max demonstratrional cueing to position his [name] and to flex his trunk forward and move slowly.  Pt completed grooming in sitting position from wheelchair with supervision as well.  Pt left in wheelchair with call button and phone within reach.  Safety belt in place at end of session.     Therapy Documentation Precautions:  Precautions Precautions: Fall Precaution Comments: flaccid [name] and motor planning difficulties with the [name] Restrictions Weight Bearing Restrictions: No   Pain: Pain Assessment Pain Assessment: No/denies pain Pain Score: 0-No pain ADL: See Function Navigator for Current Functional Status.   Therapy/Group: Individual Therapy  Bird Tailor OTR/L 12/12/2015, 11:56 AM

## 2015-12-12 NOTE — Progress Notes (Signed)
Speech Language Pathology Daily Session Note  Patient Details  Name: Taylor Obrien MRN: [id number] Date of Birth: Feb 14, 1966  Today's Date: 12/12/2015 SLP Individual Time: 1000-1100 SLP Individual Time Calculation (min): 60 min  Short Term Goals: Week 3: SLP Short Term Goal 1 (Week 3): Pt will demonstrate functional problem solving for semi-complex tasks with min assist verbal cues.  SLP Short Term Goal 2 (Week 3): Pt will utilize external aids to recall daily, functional information with min A verbal and visual cues.  SLP Short Term Goal 3 (Week 3): Patient will demonstrate selective attention in a mildly distracting enviornment for 45-60 minutes with supervision cues for redirection.  SLP Short Term Goal 4 (Week 3): Patient will attend to the left field of enviornment/[name] during functional tasks with mod I   Skilled Therapeutic Interventions:  Pt was seen for skilled ST targeting cognitive goals.  SLP facilitated the session with a semi-complex problem solving task targeting organization and abstract reasoning.  Pt required mod-max assist verbal cues for organization during the abovementioned task, min assist question cues needed for abstract reasoning.  Pt also benefited from use of a bottom marginal anchor and finger tracking to facilitate visual scanning from left to right while reading task materials.  Pt was able to utilize the abovementioned interventions following initial instruction with mod faded to min assist verbal cues.   SLP also facilitated the session with a categorical naming task targeting functional problem solving.  Pt required min assist verbal cues for mental flexibility and extra time to complete the abovementioned task.  Pt selectively attended to the abovementioned tasks for ~30 minute intervals with supervision cues for redirection.  Pt was handed off to PT at the end of today's session.  Continue per current plan of care.     Function:  Eating Eating                Cognition Comprehension Comprehension assist level: Follows basic conversation/direction with extra time/assistive device  Expression   Expression assist level: Expresses complex 90% of the time/cues < 10% of the time  Social Interaction Social Interaction assist level: Interacts appropriately 90% of the time - Needs monitoring or encouragement for participation or interaction.  Problem Solving Problem solving assist level: Solves basic 50 - 74% of the time/requires cueing 25 - 49% of the time  Memory Memory assist level: Recognizes or recalls 50 - 74% of the time/requires cueing 25 - 49% of the time    Pain Pain Assessment Pain Assessment: No/denies pain Pain Score: 0-No pain  Therapy/Group: Individual Therapy  Ahri Olson, [name] 12/12/2015, 12:38 PM

## 2015-12-12 NOTE — Progress Notes (Signed)
Subjective/Complaints: Pt states trial AFO is helping him  ROS: + Dysuria. Denies CP,SOB, N/V/D   Objective: Vital Signs: Blood pressure 152/86, pulse 64, temperature 98.3 F (36.8 C), temperature source Oral, resp. rate 18, SpO2 100 %. No results found. Results for orders placed or performed during the hospital encounter of 11/23/15 (from the past 72 hour(s))  Urinalysis, Routine w reflex microscopic (not at [facility])     Status: Abnormal   Collection Time: 12/09/15  3:17 PM  Result Value Ref Range   Color, Urine YELLOW YELLOW   APPearance CLEAR CLEAR   Specific Gravity, Urine 1.017 1.005 - 1.030   pH 5.0 5.0 - 8.0   Glucose, UA NEGATIVE NEGATIVE mg/dL   Hgb urine dipstick NEGATIVE NEGATIVE   Bilirubin Urine NEGATIVE NEGATIVE   Ketones, ur NEGATIVE NEGATIVE mg/dL   Protein, ur 30 (A) NEGATIVE mg/dL   Nitrite NEGATIVE NEGATIVE   Leukocytes, UA NEGATIVE NEGATIVE  Urine microscopic-add on     Status: Abnormal   Collection Time: 12/09/15  3:17 PM  Result Value Ref Range   Squamous Epithelial / LPF 0-5 (A) NONE SEEN   WBC, UA 0-5 0 - 5 WBC/hpf   RBC / HPF 0-5 0 - 5 RBC/hpf   Bacteria, UA NONE SEEN NONE SEEN  Culture, Urine     Status: None   Collection Time: 12/10/15  3:29 PM  Result Value Ref Range   Specimen Description URINE, RANDOM    Special Requests NONE    Culture 5,000 COLONIES/mL INSIGNIFICANT GROWTH    Report Status 12/11/2015 FINAL     BP 152/86 mmHg  Pulse 64  Temp(Src) 98.3 F (36.8 C) (Oral)  Resp 18  SpO2 100% Gen NAD. Vital signs reviewed  HEENT:  Normocephalic, atraumatic Cardio: RRR and no murmur Resp: CTA B/L and unlabored GI: BS positive and NT, ND Musc/Skel:  No tenderness.  No Edema Neuro: Alert and Oriented Sensation Absent to light touch left upper and left lower extremity Motor: [name]: Shoulder abduction 4+/5, elbow flexion/extension 4+/5, finger grip 4 +/5 [name]: 0/5 RUE/RLE: Antigravity strength Skin:   Intact. Warm and  dry.  Assessment/Plan: 1. Functional deficits secondary to Right ACA infarct with Left hemiparesis  which require 3+ hours per day of interdisciplinary therapy in a comprehensive inpatient rehab setting. Physiatrist is providing close team supervision and 24 hour management of active medical problems listed below. Physiatrist and rehab team continue to assess barriers to discharge/monitor patient progress toward functional and medical goals. FIM: Function - Bathing Bathing activity did not occur: Refused Position: Shower Body parts bathed by patient: Right arm, Left arm, Chest, Abdomen, Front perineal area, Right upper leg, Left upper leg, Right lower leg, Left lower leg Body parts bathed by helper: Buttocks, Back Bathing not applicable: Back Assist Level: Touching or steadying assistance(Pt > 75%)  Function- Upper Body Dressing/Undressing Upper body dressing/undressing activity did not occur: Refused What is the patient wearing?: Pull over shirt/dress Pull over shirt/dress - Perfomed by patient: Thread/unthread right sleeve, Thread/unthread left sleeve, Put head through opening, Pull shirt over trunk Pull over shirt/dress - Perfomed by helper: Pull shirt over trunk Assist Level: Touching or steadying assistance(Pt > 75%) Function - Lower Body Dressing/Undressing Lower body dressing/undressing activity did not occur: Refused What is the patient wearing?: Pants, Socks, Shoes Position: Wheelchair/chair at [employer]- Performed by patient: Thread/unthread right pants leg Pants- Performed by helper: Thread/unthread left pants leg, Pull pants up/down Non-skid slipper socks- Performed by patient: Don/doff right sock Non-skid slipper  socks- Performed by helper: Don/doff right sock, Don/doff left sock Socks - Performed by helper: Don/doff right sock, Don/doff left sock Shoes - Performed by patient: Don/doff right shoe Shoes - Performed by helper: Don/doff left shoe, Fasten right, Fasten  left Assist for footwear: Dependant Assist for lower body dressing: Assistive device  Function - Toileting Toileting activity did not occur: No continent bowel/bladder event Toileting steps completed by patient: Performs perineal hygiene, Adjust clothing prior to toileting, Adjust clothing after toileting Toileting steps completed by helper: Adjust clothing prior to toileting, Performs perineal hygiene, Adjust clothing after toileting Toileting Assistive Devices: Grab bar or rail Assist level: Touching or steadying assistance (Pt.75%)  Function - Air cabin crew transfer activity did not occur: N/A Toilet transfer assistive device: Mechanical lift Mechanical lift: Stedy Assist level to toilet: Touching or steadying assistance (Pt > 75%) Assist level from toilet: Touching or steadying assistance (Pt > 75%)  Function - Chair/bed transfer Chair/bed transfer method: Squat pivot Chair/bed transfer assist level: Moderate assist (Pt 50 - 74%/lift or lower) Chair/bed transfer assistive device: Armrests, Orthosis Chair/bed transfer details: Verbal cues for precautions/safety, Verbal cues for technique, Manual facilitation for placement, Manual facilitation for weight bearing, Verbal cues for sequencing  Function - Locomotion: Wheelchair Will patient use wheelchair at discharge?: Yes Type: Manual Max wheelchair distance: 150' Assist Level: Supervision or verbal cues Assist Level: Supervision or verbal cues Assist Level: Supervision or verbal cues Turns around,maneuvers to table,bed, and toilet,negotiates 3% grade,maneuvers on rugs and over doorsills: No Function - Locomotion: Ambulation Assistive device: Rail in hallway, Orthosis Max distance: 20 Assist level: Maximal assist (Pt 25 - 49%) Walk 10 feet activity did not occur: Safety/medical concerns Assist level: 2 helpers (+2 for w/c follow; max assist gait) Walk 50 feet with 2 turns activity did not occur: Safety/medical  concerns Walk 150 feet activity did not occur: Safety/medical concerns Walk 10 feet on uneven surfaces activity did not occur: Safety/medical concerns  Function - Comprehension Comprehension: Auditory Comprehension assist level: Follows basic conversation/direction with extra time/assistive device  Function - Expression Expression: Verbal Expression assist level: Expresses complex 90% of the time/cues < 10% of the time  Function - Social Interaction Social Interaction assist level: Interacts appropriately 90% of the time - Needs monitoring or encouragement for participation or interaction.  Function - Problem Solving Problem solving assist level: Solves basic 50 - 74% of the time/requires cueing 25 - 49% of the time  Function - Memory Memory assist level: Recognizes or recalls 50 - 74% of the time/requires cueing 25 - 49% of the time Patient normally able to recall (first 3 days only): Current season, Location of own room, Staff names and faces, That he or she is in a hospital  Medical Problem List and Plan: 1.  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  DVT Prophylaxis/Anticoagulation: Mechanical: Sequential compression devices, below knee Bilateral lower extremities 3. Pain Management:  hydrocodone prn for pain 4. Mood: LCSW to follow for evaluation and support.      5. Neuropsych: This patient is not fully capable of making decisions on his own behalf. 6. Skin/Wound Care: Routine pressure relief measures. Maintain adequate nutrition and hydration status.   7. Fluids/Electrolytes/Nutrition: Monitor I/O. Appetite remains good.   Hypokalemia:  boderline low at 3.4 cont po supplementation 8.  HTN:  Norvasc, normodyne , chlorthalidone at max doses- reluctant to add ACE or diuretic givne recent AKI  Catapres  prn additionally for SBP > 180 or DBP >105 as per Neurology parameters.  Nephro consulted, appreciate  recs  152/86 this am  Will continue to monitor  9. Dyslipidemia: On Lipitor.   10. H/o depression with anxiety: Used to see mental health in the past. Has been off meds for years.   Started SSRI- Celexa on 12/27  low dose xanax d/ced 11. AKI/CKD: GFR 59 on last check,will recheck now that pt on bactrim 12. OSA  CPAP at noc 13. 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

## 2015-12-12 NOTE — Progress Notes (Signed)
Physical Therapy Note  Patient Details  Name: Taylor Obrien MRN: [id number] Date of Birth: January 27, 1966 Today's Date: 12/12/2015    Time:1100-1155 55 minutes  1:1 No c/o pain.  Standing balance training with wii bowling. Pt mod/max A for standing balance, tactile cues and manual facilitation for L LE control, pt with poor awareness of LOB during bowling game.  Pt able to perform transfers with min A w/c <> mat. Gait at railing in hallway with max A, pt with increased L adductor tone noted, max A to advance L LE, mod-max A for posture and wt shifting during gait. Pt improved with continued verbal and tactile cues and repetition.  W/c mobility with hemi technique with supervision in controlled environment.  Time: 1400-1425 25 minutes  1:1 No c/o pain.  Pt able to transfer w/c <> nustep with min A, cues for safety and set up. Nu step with LEs only level 2 x 10 minutes for strengthening and control. Pt requires strap to keep L LE from abducting/ER during nustep activity. Pt enjoys nu step and states he legs feel "strong" after session.   [name] 12/12/2015, 3:05 PM

## 2015-12-12 NOTE — Progress Notes (Signed)
Social Work Patient ID: Taylor Obrien, male   DOB: [birth year], 50 y.o.   MRN: [id number] Spoke with wife via telephone to discuss information for SSD application she will get that tonight. She also wanted for worker to d a FL2 so pt can be placed on the CAP list for assistance at home. She wants worker to mail it to her home, will do once MD signs.

## 2015-12-12 NOTE — Progress Notes (Signed)
Orthopedic Tech Progress Note Patient Details:  Taylor Obrien 1966/07/21 [id number]  Patient ID: [name], male   DOB: 08-14-1966, 50 y.o.   MRN: [id number] Called in advanced brace order; spoke with Richardson Landry with the on call answering service  [name] 12/12/2015, 9:21 AM

## 2015-12-13 ENCOUNTER — Encounter (HOSPITAL_COMMUNITY): Payer: Medicaid Other

## 2015-12-13 ENCOUNTER — Inpatient Hospital Stay (HOSPITAL_COMMUNITY): Payer: Medicaid Other | Admitting: Speech Pathology

## 2015-12-13 ENCOUNTER — Inpatient Hospital Stay (HOSPITAL_COMMUNITY): Payer: Medicaid Other

## 2015-12-13 ENCOUNTER — Inpatient Hospital Stay (HOSPITAL_COMMUNITY): Payer: Medicaid Other | Admitting: Occupational Therapy

## 2015-12-13 LAB — BASIC METABOLIC PANEL
Anion gap: 12 (ref 5–15)
BUN: 20 mg/dL (ref 6–20)
CHLORIDE: 98 mmol/L — AB (ref 101–111)
CO2: 28 mmol/L (ref 22–32)
CREATININE: 1.84 mg/dL — AB (ref 0.61–1.24)
Calcium: 10.4 mg/dL — ABNORMAL HIGH (ref 8.9–10.3)
GFR calc Af Amer: 49 mL/min — ABNORMAL LOW (ref >60)
GFR, EST NON AFRICAN AMERICAN: 42 mL/min — AB (ref >60)
GLUCOSE: 97 mg/dL (ref 65–99)
Potassium: 3.8 mmol/L (ref 3.5–5.1)
SODIUM: 138 mmol/L (ref 135–145)

## 2015-12-13 LAB — CBC
HCT: 42.7 % (ref 39.0–52.0)
Hemoglobin: 14.7 g/dL (ref 13.0–17.0)
MCH: 32.5 pg (ref 26.0–34.0)
MCHC: 34.4 g/dL (ref 30.0–36.0)
MCV: 94.5 fL (ref 78.0–100.0)
PLATELETS: 286 10*3/uL (ref 150–400)
RBC: 4.52 MIL/uL (ref 4.22–5.81)
RDW: 12.3 % (ref 11.5–15.5)
WBC: 7.7 10*3/uL (ref 4.0–10.5)

## 2015-12-13 NOTE — Progress Notes (Addendum)
Subjective/Complaints: Patient is using urinal independently without incontinence except for one episode while sleeping  ROS: + Dysuria. Denies CP,SOB, N/V/D   Objective: Vital Signs: Blood pressure 156/87, pulse 70, temperature 98.5 F (36.9 C), temperature source Oral, resp. rate 18, weight 87.181 kg (192 lb 3.2 oz), SpO2 98 %. No results found. Results for orders placed or performed during the hospital encounter of 11/23/15 (from the past 72 hour(s))  Culture, Urine     Status: None   Collection Time: 12/10/15  3:29 PM  Result Value Ref Range   Specimen Description URINE, RANDOM    Special Requests NONE    Culture 5,000 COLONIES/mL INSIGNIFICANT GROWTH    Report Status 12/11/2015 FINAL     BP 156/87 mmHg  Pulse 70  Temp(Src) 98.5 F (36.9 C) (Oral)  Resp 18  Wt 87.181 kg (192 lb 3.2 oz)  SpO2 98% Gen NAD. Vital signs reviewed  HEENT:  Normocephalic, atraumatic Cardio: RRR and no murmur Resp: CTA B/L and unlabored GI: BS positive and NT, ND Musc/Skel:  No tenderness.  No Edema Neuro: Alert and Oriented Sensation Absent to light touch left upper and left lower extremity Motor: [name]: Shoulder abduction 4+/5, elbow flexion/extension 4+/5, finger grip 4 +/5 [name]: 0/5 RUE/RLE: Antigravity strength Skin:   Intact. Warm and dry.  Assessment/Plan: 1. Functional deficits secondary to Right ACA infarct with Left hemiparesis  which require 3+ hours per day of interdisciplinary therapy in a comprehensive inpatient rehab setting. Physiatrist is providing close team supervision and 24 hour management of active medical problems listed below. Physiatrist and rehab team continue to assess barriers to discharge/monitor patient progress toward functional and medical goals. FIM: Function - Bathing Bathing activity did not occur: Refused Position: Shower Body parts bathed by patient: Right arm, Left arm, Chest, Abdomen, Front perineal area, Right upper leg, Left upper leg, Right lower leg,  Left lower leg Body parts bathed by helper: Buttocks, Back Bathing not applicable: Back Assist Level: Touching or steadying assistance(Pt > 75%)  Function- Upper Body Dressing/Undressing Upper body dressing/undressing activity did not occur: Refused What is the patient wearing?: Pull over shirt/dress Pull over shirt/dress - Perfomed by patient: Thread/unthread right sleeve, Thread/unthread left sleeve, Put head through opening, Pull shirt over trunk Pull over shirt/dress - Perfomed by helper: Pull shirt over trunk Assist Level: Touching or steadying assistance(Pt > 75%) Function - Lower Body Dressing/Undressing Lower body dressing/undressing activity did not occur: Refused What is the patient wearing?: Pants, Socks, Shoes Position: Wheelchair/chair at [employer]- Performed by patient: Thread/unthread right pants leg Pants- Performed by helper: Thread/unthread left pants leg, Pull pants up/down Non-skid slipper socks- Performed by patient: Don/doff right sock Non-skid slipper socks- Performed by helper: Don/doff right sock, Don/doff left sock Socks - Performed by patient: Don/doff right sock Socks - Performed by helper: Don/doff left sock Shoes - Performed by patient: Don/doff right shoe Shoes - Performed by helper: Fasten right, Fasten left, Don/doff left shoe Assist for footwear: Dependant Assist for lower body dressing: Assistive device  Function - Toileting Toileting activity did not occur: No continent bowel/bladder event Toileting steps completed by patient: Performs perineal hygiene, Adjust clothing prior to toileting, Adjust clothing after toileting Toileting steps completed by helper: Adjust clothing prior to toileting, Performs perineal hygiene, Adjust clothing after toileting Toileting Assistive Devices: Grab bar or rail Assist level: Touching or steadying assistance (Pt.75%)  Function - Toilet Transfers Toilet transfer activity did not occur: N/A Toilet transfer  assistive device: Mechanical lift Mechanical lift: [employer]  Assist level to toilet: Touching or steadying assistance (Pt > 75%) Assist level from toilet: Touching or steadying assistance (Pt > 75%)  Function - Chair/bed transfer Chair/bed transfer method: Squat pivot Chair/bed transfer assist level: Moderate assist (Pt 50 - 74%/lift or lower) Chair/bed transfer assistive device: Armrests, Orthosis Chair/bed transfer details: Verbal cues for precautions/safety, Verbal cues for technique, Manual facilitation for placement, Manual facilitation for weight bearing, Verbal cues for sequencing  Function - Locomotion: Wheelchair Will patient use wheelchair at discharge?: Yes Type: Manual Max wheelchair distance: 150' Assist Level: Supervision or verbal cues Assist Level: Supervision or verbal cues Assist Level: Supervision or verbal cues Turns around,maneuvers to table,bed, and toilet,negotiates 3% grade,maneuvers on rugs and over doorsills: No Function - Locomotion: Ambulation Assistive device: Rail in hallway, Orthosis Max distance: 20 Assist level: Maximal assist (Pt 25 - 49%) Walk 10 feet activity did not occur: Safety/medical concerns Assist level: 2 helpers (+2 for w/c follow; max assist gait) Walk 50 feet with 2 turns activity did not occur: Safety/medical concerns Walk 150 feet activity did not occur: Safety/medical concerns Walk 10 feet on uneven surfaces activity did not occur: Safety/medical concerns  Function - Comprehension Comprehension: Auditory Comprehension assist level: Follows basic conversation/direction with extra time/assistive device  Function - Expression Expression: Verbal Expression assist level: Expresses complex 90% of the time/cues < 10% of the time  Function - Social Interaction Social Interaction assist level: Interacts appropriately 90% of the time - Needs monitoring or encouragement for participation or interaction.  Function - Problem Solving Problem  solving assist level: Solves basic 50 - 74% of the time/requires cueing 25 - 49% of the time  Function - Memory Memory assist level: Recognizes or recalls 50 - 74% of the time/requires cueing 25 - 49% of the time Patient normally able to recall (first 3 days only): Current season, Location of own room, Staff names and faces, That he or she is in a hospital  Medical Problem List and Plan: 1.  Left Hemiparesis secondary to Right ACA infarct, Team conference today please see physician documentation under team conference tab, met with team face-to-face to discuss problems,progress, and goals. Formulized individual treatment plan based on medical history, underlying problem and comorbidities. 2.  DVT Prophylaxis/Anticoagulation: Mechanical: Sequential compression devices, below knee Bilateral lower extremities 3. Pain Management:  hydrocodone prn for pain 4. Mood: LCSW to follow for evaluation and support.      5. Neuropsych: This patient is not fully capable of making decisions on his own behalf. 6. Skin/Wound Care: Routine pressure relief measures. Maintain adequate nutrition and hydration status.   7. Fluids/Electrolytes/Nutrition: Monitor I/O. Appetite remains good.   Hypokalemia:  boderline low at 3.4 cont po supplementation,recheck BMET 8.  HTN:  Norvasc, normodyne , chlorthalidone, Catapres TTS at max doses- NO ACE or diuretic given recent AKI  Catapres prn additionally for SBP > 180 or DBP >105 as per Neurology parameters Nephro consulted, appreciate recs  156/87 this am  Will continue to monitor  9. Dyslipidemia: On Lipitor.   10. H/o depression with anxiety: Used to see mental health in the past. Has been off meds for years.   Started SSRI- Celexa on 12/27  low dose xanax d/ced 11. AKI/CKD: GFR 59 on last check,will recheck Today now that pt on bactrim 12. OSA  CPAP at noc  13. 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

## 2015-12-13 NOTE — Progress Notes (Signed)
Physical Therapy Session Note  Patient Details  Name: Taylor Obrien MRN: [id number] Date of Birth: 1966/09/03  Today's Date: 12/13/2015 PT Individual Time: 1300-1430 PT Individual Time Calculation (min): 90 min       Skilled Therapeutic Interventions/Progress Updates: pt reported he needed to use urinal to void.  Pt began using urinal in sitting, but L hand bumped urinal, spilling urine on pt, floor, and clothing.  +2 for balance and clothing management to clean up.    Marcello Moores Page from [facility] here for consultation for L AFO.  He observed gait with pt wearing trial Toe Off LAFO during gait.  Marcello Moores will return on Friday 12/15/15 to determine if an AFO is indicated at this point.  Neuromuscular re-education via visual feedback, forced use, VCS, manual cues for L knee flex/ext with foot on MaxiSlide in sitting and standing, pre-gait and gait.  Pt unable to elicit knee motions in sitting or standing.  Gait with hallway rail, L Toe Off AFO and L shoe cover x 10' x 2, x 25' with max assist, and w/c follow, and gait with weighted grocery cart x 15' +2 for control of cart, [name] advancement and [name] stance stability.  w/c> mat to L unsafe, impulsive transfer with pt requesting pt not to stand up, just lift bottom up, but pt stood fully, and was unable to advance [name] at all with resulting LOB; requireing +2 to prevent fall.  neuromuscular re-education via forced use, manual and VCs for bil terminal hip ext in tall kneeling, x 10 x 2.  When attempting to use R hand to reach in tall kneeling, pt quickly flexed at trunk, dropping chest onto stool in front of him, unsafely; activity terminated.  Biased standing to L during RUE activity on table in front of him, to manipulate stacking cups; L knee continually buckled.  With max VCS, pt able to extend knee 50% of tries. Pt returned to room; quick belt donned and all needs within reach.   Pt is limited by impulsivity, decreased memory, motor planning  problems. Therapy Documentation Precautions:  Precautions Precautions: Fall Precaution Comments: flaccid [name] and motor planning difficulties with the [name] Restrictions Weight Bearing Restrictions: No        See Function Navigator for Current Functional Status.   Therapy/Group: Individual Therapy  Arleen Bar 12/13/2015, 4:30 PM

## 2015-12-13 NOTE — Progress Notes (Signed)
Placed pt on cpap tolerating well no issues to report.

## 2015-12-13 NOTE — Consult Note (Signed)
DIAGNOSTIC EVALUATION - [name]   MEDICAL NECESSITY:  Taylor Obrien was seen on the [facility] for an initial diagnostic evaluation owing to the patient's diagnosis of cerebral infarction.   Records indicate that Taylor Obrien is a "50 y.o. male with history of HTN, depression, chronic pain who was admitted via [facility] on 11/18/15 with acute onset of left sided weakness.  CTA showed M2 occlusion. He was treated with TPA and underwent cerebral angio showing occluded R-ACA proximal segment with partial distal leptomeningeal collateralization and R-MCA without filling defects. 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 UDS positive for THC.  Neurology feels that patient with embolic stroke of unknown etiology and 30 day monitor recommended by cardiology as TEE showed moderate LVH with EF 55-60% but no thrombus, ASD or PFO.  Patient has had malignant [HTN] requiring IV labetalol and Neurology recommends permissive HTN but gradually normalize in 5-7 days."   Patient was previously seen by this provider for neurocognitive testing. Results from that visit were as follows:   Overall, Taylor Obrien denied experiencing any overt cognitive symptoms, though testing revealed mild issues with delayed free recall. However, most of his skills were within normal limits. From an emotional standpoint, he denied suffering any adjustment issues but it is clear that he had a traumatic childhood that influenced the rest of his life. Counseling is strongly recommended and I will follow the patient throughout this admission. I will ask his social worker to provide resources for more intensive psychotherapy upon discharge. A psychiatry consult at some point may also be warranted. Substance abuse counseling  could also be beneficial. I ask that his social work inquire about treatment for sleep disturbance. 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  During today's visit, Taylor Obrien endorsed a much improved mood. He was more optimistic and his affect was brighter. He has been sleeping better with CPAP therapy as well as trazadone, which is also likely helping with depression and anxiety. He successfully use nicotine patches for smoking cessation. He remains interested in participating in counseling upon discharge. He is thinking about returning to a behavioral health program in [facility] (name unknown). He wishes for the program to additionally assist with anger management. I encouraged him to also communicate with them his issues with substance abuse. There was some discussion about concern for completing basic self-care activities and the humiliation that his medical situation can cause. We spent time processing these feelings.   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 He is excited about his discharge date (the 11th) and the plan is for his wife to assist with his transition home as well as with instrumental daily activities and basic self-care. Therapy continues to go well and he feels that progress is being made. He described the rehab staff as "excellent." From a cognitive perspective, no new issues reported.   No adjustment issues endorsed. Suicidal/homicidal ideation, plan or intent was denied. No manic or hypomanic episodes were reported. The patient denied experiencing any auditory/visual hallucinations. No major behavioral or personality changes were endorsed.   PROCEDURES: [1 unit 90791] Diagnostic clinical interview  Review of available records   IMPRESSION: Mr.  Obrien reported no cognitive difficulties.  Mood appears much improved. Recommend continuing trazadone for sleep and depression/anxiety. He plans to seek counseling with a behavioral health program in [facility] but I would love for him to have some additional resources for anger management and substance abuse in case this organization does not provide these services. I will ask that his Social Worker compile this information. With regard to alien hand syndrome, I encouraged him to keep his left hand busy with a stress ball or hand strengthener. I was happy to hear that he was supplied with a CPAP machine to help him get the best quality sleep. Time was also spent discussing stroke recovery course.   Neuropsychology plans to follow-up with Taylor Obrien next Monday for one final check-in and to ensure he feels confident about his discharge on the 11th. This can be done formally or informally.     Rutha Bouchard, Psy.D.  Clinical Neuropsychologist

## 2015-12-13 NOTE — Patient Care Conference (Signed)
Inpatient RehabilitationTeam Conference and Plan of Care Update Date: 12/13/2015   Time: 10:30 AM    Patient Name: Taylor Obrien      Medical Record Number: [id number]  Date of Birth: 01-Nov-1966 Sex: Male         Room/Bed: 4W16C/4W16C-01 Payor Info: Payor: MEDICAID [place] / Plan: MEDICAID [place] ACCESS / Product Type: *No Product type* /    Admitting Diagnosis: r cva  Admit Date/Time:  11/23/2015  6:43 PM Admission Comments: No comment available   Primary Diagnosis:  Hemiplegia and hemiparesis following unspecified cerebrovascular disease affecting left non-dominant side (HCC) Principal Problem: Hemiplegia and hemiparesis following unspecified cerebrovascular disease affecting left non-dominant side [facility])  Patient Active Problem List   Diagnosis Date Noted  . Dysuria   . OSA (obstructive sleep apnea)   . Hypokalemia   . Elevated blood pressure   . Hemiparesis affecting left side as late effect of stroke ([place])   . Epistaxis   . HTN (hypertension) 11/27/2015  . AKI (acute kidney injury) ([place]) 11/27/2015  . Hemiplegia and hemiparesis following unspecified cerebrovascular disease affecting left non-dominant side ([place]) 11/23/2015  . Gait disturbance, post-stroke 11/23/2015  . Stroke due to occlusion of right anterior cerebral artery ([place]) 11/23/2015  . Cerebrovascular accident (CVA) due to occlusion of right anterior cerebral artery ([place])   . Essential hypertension   . Depression   . Chronic pain syndrome   . ETOH abuse   . Marijuana abuse   . Cerebrovascular accident (CVA) due to thrombosis of right carotid artery ([place])   . Malignant hypertension   . Hyperlipidemia   . CVA (cerebral infarction) 11/18/2015  . Stroke (cerebrum) ([place]) 11/18/2015  . Chest pain 10/09/2012  . GERD (gastroesophageal reflux disease) 10/09/2012  . Chronic back pain 10/09/2012  . Tobacco abuse 10/09/2012  . Hypertensive heart disease 08/31/2012  . Accelerated hypertension 08/31/2012  . Precordial pain  08/31/2012    Expected Discharge Date: Expected Discharge Date: 12/20/15  Team Members Present: Physician leading conference: Dr. Alysia Penna Social Worker Present: Ovidio Kin, LCSW Nurse Present: Heather Roberts, RN PT Present: Georjean Mode, PT OT Present: Clyda Greener, OT SLP Present: Windell Moulding, SLP PPS Coordinator present : Daiva Nakayama, RN, CRRN     Current Status/Progress Goal Weekly Team Focus  Medical   increased tone Left hip adductors, sup ADL, no active movement  home with family assist  Training with new orthotic, D/C planning   Bowel/Bladder   Continent of bowel and bladder; LBM 1/2  Mod I  Assess and treat for constipation as needed   Swallow/Nutrition/ Hydration     na        ADL's   mod assist for LB selfcare sit to stand, supervision for UB selfcare, mod assist for functional transfers,  still with motor planning issues with [name] use  supervision - min A  neuromuscular re-education,  transfer training, balance re-training, [name] coordination and functional use, selfcare re-training, pt'family education   Mobility   min A transfers, max A gait at railing, supervision w/c  supervision bed mobility and w/c mobility, min A transfers, mod A standing and gait  neuro re-ed, mobility, gait/pre-gait   Communication     na        Safety/Cognition/ Behavioral Observations  min assist for basic tasks, mod assist for complex tasks   supervision   continue to address semi-complex cognition    Pain   C/o occasional headache- norco adequately relieves pain  < 3  Assess and  treat for pain q shift and prn   Skin   lotrimen cream applied to face rash- no skin breakdown or infection noted elsewhere  Mod I  Assess skin q shift and prn      *See Care Plan and progress notes for long and short-term goals.  Barriers to Discharge: has an apt with 4 descending steps to enter    Possible Resolutions to Barriers:  cont rehab    Discharge Planning/Teaching Needs:  Wife  planning to come in to begin family training to begin to prepare for discharge next week. Working on getting pt CAP services and PCS      Team Discussion:  Making good progress in therapies. More encouraged by his progress. BP controlled on four meds. MD addressing pt's questions regarding sex. Alien hand better. Four steps into apartment need much family education with wife and son or another family member. Need to begin family education with wife  Revisions to Treatment Plan:  None   Continued Need for Acute Rehabilitation Level of Care: The patient requires daily medical management by a physician with specialized training in physical medicine and rehabilitation for the following conditions: Daily direction of a multidisciplinary physical rehabilitation program to ensure safe treatment while eliciting the highest outcome that is of practical value to the patient.: Yes Daily medical management of patient stability for increased activity during participation in an intensive rehabilitation regime.: Yes Daily analysis of laboratory values and/or radiology reports with any subsequent need for medication adjustment of medical intervention for : Neurological problems  Zarinah Oviatt, Gardiner Rhyme 12/13/2015, 12:24 PM

## 2015-12-13 NOTE — Progress Notes (Signed)
Speech Language Pathology Daily Session Note  Patient Details  Name: Taylor Obrien MRN: [id number] Date of Birth: Mar 03, 1966  Today's Date: 12/13/2015 SLP Individual Time: 1000-1030; 1432-1500  SLP Individual Time Calculation (min): 30 min; 28 min   Short Term Goals: Week 3: SLP Short Term Goal 1 (Week 3): Pt will demonstrate functional problem solving for semi-complex tasks with min assist verbal cues.  SLP Short Term Goal 2 (Week 3): Pt will utilize external aids to recall daily, functional information with min A verbal and visual cues.  SLP Short Term Goal 3 (Week 3): Patient will demonstrate selective attention in a mildly distracting enviornment for 45-60 minutes with supervision cues for redirection.  SLP Short Term Goal 4 (Week 3): Patient will attend to the left field of enviornment/[name] during functional tasks with mod I   Skilled Therapeutic Interventions:  Session 1: Pt was seen for skilled ST targeting cognitive goals.  SLP facilitated the session with a semi-complex functional problem solving task.  Pt required mod assist verbal and visual cues for organization and sequencing when navigating websites to plan a family trip.  He selectively attended to task in a mildly distracting environment for >20 minutes with supervision cues for redirection.  Pt was returned to room and left with call bell within reach, quick release belt donned for safety.  Continue per current plan of care.   Session 2:  Pt was seen for skilled ST targeting cognitive goals.  SLP continued with AM therapy session's semi-complex problem solving task.  Pt continue to require mod assist verbal and visual cues for sequencing and organization when navigating between websites to find targeted information. Supervision cues needed to complete functional math calculations accurately to calculate a total budget of the abovementioned trip.  Pt also watched a 4 minute video related to his trip and was able to immediately recall 3  details from the video with min question cues.  Pt was returned to room and left with call bell within reach and quick release belt donned for safety.  Continue per current plan of care.    Function:  Eating Eating                 Cognition Comprehension Comprehension assist level: Follows basic conversation/direction with extra time/assistive device  Expression   Expression assist level: Expresses complex 90% of the time/cues < 10% of the time  Social Interaction Social Interaction assist level: Interacts appropriately 90% of the time - Needs monitoring or encouragement for participation or interaction.  Problem Solving Problem solving assist level: Solves basic 50 - 74% of the time/requires cueing 25 - 49% of the time  Memory Memory assist level: Recognizes or recalls 50 - 74% of the time/requires cueing 25 - 49% of the time    Pain Pain Assessment Pain Assessment: No/denies pain Pain Score: 0-No pain  Therapy/Group: Individual Therapy  Travonta Gill, [name] 12/13/2015, 12:39 PM

## 2015-12-13 NOTE — Progress Notes (Signed)
Occupational Therapy Weekly Progress Note  Patient Details  Name: Taylor Obrien MRN: [id number] Date of Birth: Jul 23, 1966  Beginning of progress report period: December 06, 2015 End of progress report period: December 13, 2015  Today's Date: 12/13/2015 OT Individual Time: 0900-1000 OT Individual Time Calculation (min): 60 min    Skilled Therapeutic Interventions/Progress Updates:    Weekly Progress:  Mr. Folk continues to need mod assist for squat pivot transfers from wheelchair to tub bench and commode seat.  Mod assist for sit to stand when performing LB selfcare as well as he demonstrates very little activity in the left knee.  Pt still exhibit impulsivity as well and tends to throw his body around during these transitional movements instead of moving slowly.  Increased difficulty noted with motor planning in the [name] as well but uses functionally with self care tasks with increased time.  Supervision for UB selfcare tasks and mod assist for LB.  He continues to need max assist to cross and maintain the [name] over the right knee with dressing tasks following hemi techniques.  Feel he is making steady progress overall but will still need min to mod assist at discharge with update goals accordingly.    Pt still demonstrates decreased balance, decreased awareness, decreased [name] strength and functional use, decreased sensation, decreased motor planning and will continue to benefit from continued OT.  Care Plan goals have been downgraded based on pt's progress   Session Note:  Worked on neuromuscular re-education for the [name] and [name] during session.  Had pt work on [employer] pipe puzzle incorporating problem solving and planning as well.  Pt able to spontaneously use the [name] for activity with good results, however he would occasionally grasp the wrong piece with the [name] repeatedly secondary to apraxia.  Overall min questioning cueing needed to complete PVC puzzle.  Had pt work on sit to stand transitions and  standing while performing puzzle as well.  Had pt work on standing while reaching down to the left with the left UE and then having to return to standing to place the piece.  Max assist needed for completion of intervals and support the [name].  At times pt falling backwards and to the left requiring max assist to correct or having to sit down and re-organize his body before standing.  Emphasis on slow forward trunk flexion with [name] placed correctly during all sit to stand transitons.      Therapy Documentation Precautions:  Precautions Precautions: Fall Precaution Comments: flaccid [name] and motor planning difficulties with the [name] Restrictions Weight Bearing Restrictions: No  Pain: Pain Assessment Pain Assessment: No/denies pain Pain Score: 0-No pain ADL: ADL ADL Comments: see Functional Tool  See Function Navigator for Current Functional Status.   Therapy/Group: Individual Therapy  Cabria Micalizzi OTR/L  12/13/2015, 10:58 AM

## 2015-12-14 ENCOUNTER — Inpatient Hospital Stay (HOSPITAL_COMMUNITY): Payer: Medicaid Other | Admitting: Speech Pathology

## 2015-12-14 ENCOUNTER — Inpatient Hospital Stay (HOSPITAL_COMMUNITY): Payer: Medicaid Other

## 2015-12-14 NOTE — Progress Notes (Signed)
Patient reported having chest pain at approximately 1640. Patient denied shortness of breath. Reported discomfort in left chest. Patient reported "grabbing pain and would let go then grab again". VS B/P 133/84 HR 75 O2 sat 100% on room air. P. Love, PA notified and orders received. EKG  Results obtained and given to P. Love, PA with no new orders. Taylor Obrien

## 2015-12-14 NOTE — Progress Notes (Signed)
Physical Therapy Weekly Progress Note  Patient Details  Name: Taylor Obrien MRN: [id number] Date of Birth: Jun 24, 1966  Beginning of progress report period: 12/05/15 End of progress report period: 12/13/15, late entry  Today's Date: 12/14/2015   -     Patient has met 2 of 3 short term goals.  Pt performed an unsafe transfer yesterday when asked to stand fully, but he did and [name] stayed in an awkward position as he shifted wt toward support surface; +2 needed.  Patient continues to demonstrate the following deficits:impulsivity, motor control, coordination, postural control, balance, memory and therefore will continue to benefit from skilled PT intervention to enhance overall performance with balance, postural control, ability to compensate for deficits, functional use of  left upper extremity and left lower extremity, awareness and coordination.  Patient progressing toward long term goals..  Continue plan of care. Pt's wife has participated in family ed, but needs more.  Access to pt's apt is down 4 steps with railing on R as he descends.  Family says landlord will not add another railing.  PT informed pt and wife that another adult will be needed for pt to descend these steps.  They plan for their teen age son to help, and he will come in for family ed.   PT Short Term Goals Week 2:  PT Short Term Goal 1 (Week 2): Pt will consistently perform bed <> chair transfers with min A PT Short Term Goal 1 - Progress (Week 2): Not met PT Short Term Goal 2 (Week 2): Pt will perform gait 20' with +2 assist PT Short Term Goal 2 - Progress (Week 2): Met PT Short Term Goal 3 (Week 2): Pt will demo standing balance for functional task with max A PT Short Term Goal 3 - Progress (Week 2): Met STG = LTGs due to LOS     Therapy Documentation Precautions:  Precautions Precautions: Fall Precaution Comments: flaccid [name] and motor planning difficulties with the [name] Restrictions Weight Bearing Restrictions: No      See Function Navigator for Current Functional Status.  Therapy/Group: Individual Therapy  [name] 12/14/2015, 8:43 AM

## 2015-12-14 NOTE — Plan of Care (Signed)
Problem: RH BOWEL ELIMINATION Goal: RH STG MANAGE BOWEL WITH ASSISTANCE STG Manage Bowel with Assistance. Mod I  Outcome: Not Progressing Required mod assist with sorbitol Goal: RH STG MANAGE BOWEL W/MEDICATION W/ASSISTANCE STG Manage Bowel with Medication with Assistance. Mod I  Outcome: Not Progressing Mod assist

## 2015-12-14 NOTE — Progress Notes (Signed)
Orthopedic Tech Progress Note Patient Details:  Taylor Obrien 04-08-1966 [id number]  Patient ID: [name], male   DOB: [birth year], 50 y.o.   MRN: [id number]   [name] 12/14/2015, 7:51 AMCalled [facility] for left AFO.

## 2015-12-14 NOTE — Progress Notes (Signed)
Speech Language Pathology Daily Session Note  Patient Details  Name: Taylor Obrien MRN: [id number] Date of Birth: [birth year]  Today's Date: 12/14/2015 SLP Individual Time: 1003-1100 SLP Individual Time Calculation (min): 57 min  Short Term Goals: Week 3: SLP Short Term Goal 1 (Week 3): Pt will demonstrate functional problem solving for semi-complex tasks with min assist verbal cues.  SLP Short Term Goal 2 (Week 3): Pt will utilize external aids to recall daily, functional information with min A verbal and visual cues.  SLP Short Term Goal 3 (Week 3): Patient will demonstrate selective attention in a mildly distracting enviornment for 45-60 minutes with supervision cues for redirection.  SLP Short Term Goal 4 (Week 3): Patient will attend to the left field of enviornment/[name] during functional tasks with mod I   Skilled Therapeutic Interventions:  Pt was seen for skilled ST targeting cognitive goals.  SLP facilitated the session with a semi-complex deductive reasoning task targeting functional problem solving.  Pt required max assist for organization, impulsivity, and left attention to complete that abovementioned task for 100% accuracy.  SLP also facilitated the session with a descriptive naming task targeting selective attention.  Pt was able to selectively attend to task for 20 minutes with supervision cues for redirection.  Pt was returned to room and left with call bell within reach.  Continue per current plan of care.   Function:  Eating Eating                 Cognition Comprehension Comprehension assist level: Follows basic conversation/direction with extra time/assistive device  Expression   Expression assist level: Expresses complex 90% of the time/cues < 10% of the time  Social Interaction Social Interaction assist level: Interacts appropriately 90% of the time - Needs monitoring or encouragement for participation or interaction.  Problem Solving Problem solving assist level:  Solves basic 90% of the time/requires cueing < 10% of the time  Memory Memory assist level: Recognizes or recalls 50 - 74% of the time/requires cueing 25 - 49% of the time    Pain Pain Assessment Pain Assessment: No/denies pain Pain Score: 7  Pain Type: Acute pain Pain Location: Back Pain Orientation: Lower Pain Descriptors / Indicators: Aching Pain Frequency: Occasional Pain Onset: Gradual Patients Stated Pain Goal: 3 Pain Intervention(s): Medication (See eMAR) (vicodin 1 po)  Therapy/Group: Individual Therapy  Rayli Wiederhold, Elmyra Ricks L 12/14/2015, 1:40 PM

## 2015-12-14 NOTE — Progress Notes (Addendum)
Physical Therapy Session Note  Patient Details  Name: Taylor Obrien MRN: [id number] Date of Birth: 03/31/1966  Today's Date: 12/14/2015 PT Individual Time: 1300-1430 PT Individual Time Calculation (min): 90 min   Short Term Goals: Week 3: = LTGs due to LOS    Skilled Therapeutic Interventions/Progress Updates:    neuromuscular re-education via forced use, visual feedback with mirror, manual cues, VCs for gait with L AFO, L  Knee ACE, shoe cover; up/down 8 3" high steps , R rail; L visual attention for L foot placement before transfer; Kinetron in sitting and standing,  focusing on L mass extension; L visual attention and L hand use for large peg board in sitting, dual task of keeping track of numbers of 3 colors of pegs..  Pt demonstrated minimal forward progression of L LE during gait and extension on Kinetron. In standing, pt able to extend knee on command 50% of tries.  Gait on steps required +2 due to impulsivity, L knee buckling, motor planning problems; ascended forward, descended backward, step to method, R rail only. Gait on steps unsafe even with +2.  Pt reported that [employer] is building a ramp at his apt; PT passed this on to [place] and requested verification.  With dual task, pt remembered totals correctly for 2/3 colors.   PT returned pt to his room and left him sitting in w/c with quick release belt on and all needs within reach.    Therapy Documentation Precautions:  Precautions Precautions: Fall Precaution Comments: flaccid [name] and motor planning difficulties with the [name] Restrictions Weight Bearing Restrictions: No Pain: Pain Assessment Pain Assessment: No/denies pain     See Function Navigator for Current Functional Status.   Therapy/Group: Individual Therapy  Toniyah Dilmore 12/14/2015, 2:44 PM

## 2015-12-14 NOTE — Progress Notes (Signed)
Patient reported to have brief episode of chest pain this afternoon. No radiation, no SOB or N/V.  EKG shows NSR. Patient resting comfortably without complaints. He reports doing a lot of UE activity with therapy and question MS symptoms.   Exam: Chest wall: non-tender to palpation.  Lungs clear without wheezes. Heart: RRR, no murmurs.  Abdomen: soft, non-tender. +BS.   A/P: 1. Transient episode CP: Question muscle spasms. Heat advised. Will monitor for now.

## 2015-12-14 NOTE — Progress Notes (Signed)
Subjective/Complaints: Appreciate neuropsych note Low back pain occurs with prolonged sitting no prior hx ROS: + Dysuria. Denies CP,SOB, N/V/D   Objective: Vital Signs: Blood pressure 141/78, pulse 64, temperature 98 F (36.7 C), temperature source Oral, resp. rate 18, weight 87.181 kg (192 lb 3.2 oz), SpO2 96 %. No results found. Results for orders placed or performed during the hospital encounter of 11/23/15 (from the past 72 hour(s))  CBC     Status: None   Collection Time: 12/13/15 12:15 PM  Result Value Ref Range   WBC 7.7 4.0 - 10.5 K/uL   RBC 4.52 4.22 - 5.81 MIL/uL   Hemoglobin 14.7 13.0 - 17.0 g/dL   HCT 42.7 39.0 - 52.0 %   MCV 94.5 78.0 - 100.0 fL   MCH 32.5 26.0 - 34.0 pg   MCHC 34.4 30.0 - 36.0 g/dL   RDW 12.3 11.5 - 15.5 %   Platelets 286 150 - 400 K/uL  Basic metabolic panel     Status: Abnormal   Collection Time: 12/13/15 12:15 PM  Result Value Ref Range   Sodium 138 135 - 145 mmol/L   Potassium 3.8 3.5 - 5.1 mmol/L   Chloride 98 (L) 101 - 111 mmol/L   CO2 28 22 - 32 mmol/L   Glucose, Bld 97 65 - 99 mg/dL   BUN 20 6 - 20 mg/dL   Creatinine, Ser 1.84 (H) 0.61 - 1.24 mg/dL   Calcium 10.4 (H) 8.9 - 10.3 mg/dL   GFR calc non Af Amer 42 (L) >60 mL/min   GFR calc Af Amer 49 (L) >60 mL/min    Comment: (NOTE) The eGFR has been calculated using the CKD EPI equation. This calculation has not been validated in all clinical situations. eGFR's persistently <60 mL/min signify possible Chronic Kidney Disease.    Anion gap 12 5 - 15    BP 141/78 mmHg  Pulse 64  Temp(Src) 98 F (36.7 C) (Oral)  Resp 18  Wt 87.181 kg (192 lb 3.2 oz)  SpO2 96% Gen NAD. Vital signs reviewed  HEENT:  Normocephalic, atraumatic Cardio: RRR and no murmur Resp: CTA B/L and unlabored GI: BS positive and NT, ND Musc/Skel:  No tenderness.  No Edema Neuro: Alert and Oriented Sensation Absent to light touch left upper and left lower extremity Motor: [name]: Shoulder abduction 4+/5, elbow  flexion/extension 4+/5, finger grip 4 +/5 [name]: 0/5 RUE/RLE: Antigravity strength Skin:   Intact. Warm and dry.  Assessment/Plan: 1. Functional deficits secondary to Right ACA infarct with Left hemiparesis  which require 3+ hours per day of interdisciplinary therapy in a comprehensive inpatient rehab setting. Physiatrist is providing close team supervision and 24 hour management of active medical problems listed below. Physiatrist and rehab team continue to assess barriers to discharge/monitor patient progress toward functional and medical goals. FIM: Function - Bathing Bathing activity did not occur: Refused Position: Shower Body parts bathed by patient: Right arm, Left arm, Chest, Abdomen, Front perineal area, Right upper leg, Left upper leg, Right lower leg, Left lower leg Body parts bathed by helper: Buttocks, Back Bathing not applicable: Back Assist Level: Touching or steadying assistance(Pt > 75%)  Function- Upper Body Dressing/Undressing Upper body dressing/undressing activity did not occur: Refused What is the patient wearing?: Pull over shirt/dress Pull over shirt/dress - Perfomed by patient: Thread/unthread right sleeve, Thread/unthread left sleeve, Put head through opening, Pull shirt over trunk Pull over shirt/dress - Perfomed by helper: Pull shirt over trunk Assist Level: Touching or steadying assistance(Pt >  75%) Function - Lower Body Dressing/Undressing Lower body dressing/undressing activity did not occur: Refused What is the patient wearing?: Shoes Position: Wheelchair/chair at sink Pants- Performed by patient: Thread/unthread right pants leg Pants- Performed by helper: Thread/unthread left pants leg, Pull pants up/down Non-skid slipper socks- Performed by patient: Don/doff right sock Non-skid slipper socks- Performed by helper: Don/doff right sock, Don/doff left sock Socks - Performed by patient: Don/doff right sock Socks - Performed by helper: Don/doff left  sock Shoes - Performed by patient: Don/doff right shoe Shoes - Performed by helper: Don/doff left shoe, Fasten right, Fasten left Assist for footwear: Dependant Assist for lower body dressing: Assistive device  Function - Toileting Toileting activity did not occur: No continent bowel/bladder event Toileting steps completed by patient: Performs perineal hygiene, Adjust clothing prior to toileting, Adjust clothing after toileting Toileting steps completed by helper: Adjust clothing prior to toileting, Performs perineal hygiene, Adjust clothing after toileting Toileting Assistive Devices: Grab bar or rail Assist level: Touching or steadying assistance (Pt.75%)  Function - Air cabin crew transfer activity did not occur: N/A Toilet transfer assistive device: Mechanical lift Mechanical lift: Stedy Assist level to toilet: Touching or steadying assistance (Pt > 75%) Assist level from toilet: Touching or steadying assistance (Pt > 75%)  Function - Chair/bed transfer Chair/bed transfer method: Stand pivot Chair/bed transfer assist level: 2 helpers ([name] became trapped under him as he turned to L) Chair/bed transfer assistive device: Armrests, Orthosis Chair/bed transfer details: Verbal cues for precautions/safety, Verbal cues for technique, Manual facilitation for placement, Manual facilitation for weight bearing, Verbal cues for sequencing  Function - Locomotion: Wheelchair Will patient use wheelchair at discharge?: Yes Type: Manual Max wheelchair distance: 150' Assist Level: Supervision or verbal cues Assist Level: Supervision or verbal cues Assist Level: Supervision or verbal cues Turns around,maneuvers to table,bed, and toilet,negotiates 3% grade,maneuvers on rugs and over doorsills: No Function - Locomotion: Ambulation Assistive device: Rail in hallway, Orthosis Max distance: 25 Assist level: Maximal assist (Pt 25 - 49%) Walk 10 feet activity did not occur: Safety/medical  concerns Assist level: 2 helpers Walk 50 feet with 2 turns activity did not occur: Safety/medical concerns Walk 150 feet activity did not occur: Safety/medical concerns Walk 10 feet on uneven surfaces activity did not occur: Safety/medical concerns  Function - Comprehension Comprehension: Auditory Comprehension assist level: Follows basic conversation/direction with extra time/assistive device  Function - Expression Expression: Verbal Expression assist level: Expresses complex 90% of the time/cues < 10% of the time  Function - Social Interaction Social Interaction assist level: Interacts appropriately 90% of the time - Needs monitoring or encouragement for participation or interaction.  Function - Problem Solving Problem solving assist level: Solves basic less than 25% of the time - needs direction nearly all the time or does not effectively solve problems and may need a restraint for safety  Function - Memory Memory assist level: Recognizes or recalls 50 - 74% of the time/requires cueing 25 - 49% of the time Patient normally able to recall (first 3 days only): Current season, Location of own room, Staff names and faces, That he or she is in a hospital  Medical Problem List and Plan: 1.  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  DVT Prophylaxis/Anticoagulation: Mechanical: Sequential compression devices, below knee Bilateral lower extremities 3. Pain Management:  hydrocodone prn for pain, ad Kpad for low back 4. Mood: LCSW to follow for evaluation and support.      5. Neuropsych: This patient is  not fully capable of making decisions on his own behalf. 6. Skin/Wound Care: Routine pressure relief measures. Maintain adequate nutrition and hydration status.   7. Fluids/Electrolytes/Nutrition: Monitor I/O. Appetite remains good.   Hypokalemia:  boderline low at 3.4 cont po supplementation,recheck BMET 8.  HTN:  Norvasc, normodyne , chlorthalidone, Catapres TTS  at max doses- NO ACE or diuretic given recent AKI  Catapres prn additionally for SBP > 180 or DBP >105 as per Neurology parameters Nephro consulted, appreciate recs  141/78 this am  Will continue to monitor  9. Dyslipidemia: On Lipitor.   10. H/o depression with anxiety: Used to see mental health in the past. Has been off meds for years.   Started SSRI- Celexa on 12/27  low dose xanax d/ced 11. AKI/CKD: GFR 42 worsening likely due to  bactrim 12. OSA  CPAP at noc  13. 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

## 2015-12-14 NOTE — Progress Notes (Signed)
Occupational Therapy Session Note  Patient Details  Name: Taylor Obrien MRN: [id number] Date of Birth: 1966/04/30  Today's Date: 12/14/2015 OT Individual Time: [id number] Individual Time Calculation (min): 60 min   Short Term Goals: Week 2:  OT Short Term Goal 1 (Week 2): STGs=LTGs secondary to estimated LOS  Skilled Therapeutic Interventions/Progress Updates: ADL-retraining at shower level with focus on improved awareness and attention to [name] during transfers, re-ed on functional use of [name] as stabilizer, improved dynamic standing balance, and discharge planning.   Pt received supine in bed c/o back pain and receptive for assist out of bed to shower.   +2 helper present but not required as pt was able to roll to his right and rise to sit at EOB w/o HOB elevated or bed rails this date, given extra time and min cues for technique.   Pt required mod vc to sequence squat pivot transfer to w/c with steadying assist and manual facilitation to place [name] properly prior to transfer.   Pt able to propel w/c to doorway of bathroom unassisted with min vc for technique and problem-solving.   Pt completed squat pivot transfer to shower using grab bar again with manual facilitation to attend and place [name] with foot flat and within proper distance prior to transfer.   No instability noted during transfer and sitting on bench as pt undressed however he required cues to reposition 3 times d/t migration toward edge of bench.   Pt bathed thoroughly using LH sponge but requested assist to thoroughly wash buttocks while standing supported by grab bar in shower.  Pt recovered to w/c and dressed at sink with overall min assist to lace underwear and pant left leg.   Pt attempts to cross over leg but is unable to manage dominant extension pattern or release leg using his left hand.   As pt reports that wpouse will assist with lower body dressing, OT educated pt on use of [name] as stabilizer while standing to incorporate use of  left hand grasp response interfering with skilled use of left hand during BADL.  Pt attempts to use both hands to pull up pants when standing but loses his balance during each attempt as grasp response dominates and interferes with protective reaction during LOB.   Pt able to don shirt with min vc for problem solving.    Pt left in w/c at end of session with call light placed within reach, QRB applied for safety.     Therapy Documentation Precautions:  Precautions Precautions: Fall Precaution Comments: flaccid [name] and motor planning difficulties with the [name] Restrictions Weight Bearing Restrictions: No  Vital Signs: Therapy Vitals Temp: 98 F (36.7 C) Temp Source: Oral Pulse Rate: 64 Resp: 18 BP: (!) 141/78 mmHg Patient Position (if appropriate): Lying Oxygen Therapy SpO2: 96 % O2 Device: Not Delivered   Pain: Pain Assessment Pain Assessment: 0-10 Pain Score: 7  Pain Type: Acute pain Pain Location: Back Pain Descriptors / Indicators: Aching Pain Intervention(s): Shower;Distraction  ADL: ADL ADL Comments: see Functional Tool   See Function Navigator for Current Functional Status.   Therapy/Group: Individual Therapy  Paula Busenbark,Jodie 12/14/2015, 9:30 AM

## 2015-12-15 ENCOUNTER — Inpatient Hospital Stay (HOSPITAL_COMMUNITY): Payer: Medicaid Other | Admitting: Speech Pathology

## 2015-12-15 ENCOUNTER — Inpatient Hospital Stay (HOSPITAL_COMMUNITY): Payer: Medicaid Other | Admitting: Occupational Therapy

## 2015-12-15 ENCOUNTER — Inpatient Hospital Stay (HOSPITAL_COMMUNITY): Payer: Medicaid Other | Admitting: Physical Therapy

## 2015-12-15 MED ORDER — HYDROCERIN EX CREA
TOPICAL_CREAM | Freq: Three times a day (TID) | CUTANEOUS | Status: DC
Start: 2015-12-15 — End: 2015-12-20
  Administered 2015-12-15 – 2015-12-20 (×9): via TOPICAL
  Filled 2015-12-15: qty 113

## 2015-12-15 NOTE — Progress Notes (Signed)
Speech Language Pathology Weekly Progress and Session Note  Patient Details  Name: Taylor Obrien MRN: [id number] Date of Birth: May 13, 1966  Beginning of progress report period: December 08, 2015  End of progress report period: December 15, 2015  Today's Date: 12/15/2015 SLP Individual Time: 1004-1103 SLP Individual Time Calculation (min): 59 min  Short Term Goals: Week 3: SLP Short Term Goal 1 (Week 3): Pt will demonstrate functional problem solving for semi-complex tasks with min assist verbal cues.  SLP Short Term Goal 1 - Progress (Week 3): Progressing toward goal SLP Short Term Goal 2 (Week 3): Pt will utilize external aids to recall daily, functional information with min A verbal and visual cues.  SLP Short Term Goal 2 - Progress (Week 3): Progressing toward goal SLP Short Term Goal 3 (Week 3): Patient will demonstrate selective attention in a mildly distracting enviornment for 45-60 minutes with supervision cues for redirection.  SLP Short Term Goal 3 - Progress (Week 3): Met SLP Short Term Goal 4 (Week 3): Patient will attend to the left field of enviornment/[name] during functional tasks with mod I  SLP Short Term Goal 4 - Progress (Week 3): Met    New Short Term Goals: Week 4: SLP Short Term Goal 1 (Week 4): Pt will demonstrate functional problem solving for semi-complex tasks with min assist verbal cues.  SLP Short Term Goal 2 (Week 4): Pt will utilize external aids to recall daily, functional information with min A verbal and visual cues.  SLP Short Term Goal 3 (Week 4): Patient will demonstrate selective attention in a moderately distracting enviornment for 45-60 minutes with supervision cues for redirection.  SLP Short Term Goal 4 (Week 4): Pt will recognize and correct errors in the moment during basic tasks with min assist verbal cues.   Weekly Progress Updates:  Pt made functional gains this reporting period and has met 2 out of 4 short term goals. Pt is currently at and  overall mod assist level for semi-complex functional tasks due to decreased emergent awareness of deficits, impulsivity, decreased recall of new information and decreased working memory, decreased selective attention to task, as well as decreased functional problem solving.  Pt would continue to benefit from skilled ST while inpatient in order to maximize functional independence and reduce burden of care prior to discharge.  Continue to anticipate that pt will need 24/7 supervision at discharge in addition to follow up ST services at next level of care.  Pt and family education is ongoing.     Intensity: Minumum of 1-2 x/day, 30 to 90 minutes Frequency: 3 to 5 out of 7 days Duration/Length of Stay: anticipated d/c date of 1/11 Treatment/Interventions: Cognitive remediation/compensation;Functional tasks;Oral motor exercises;Cueing hierarchy;Internal/external aids;Patient/family education;Medication managment;Speech/Language facilitation;Therapeutic Activities;Therapeutic Exercise;Environmental controls;Dysphagia/aspiration precaution training   Daily Session  Skilled Therapeutic Interventions: Pt was seen for skilled ST targeting cognitive goals.  SLP facilitated the session with a semi-complex deductive reasoning/scheduling task targeting functional problem solving.  Pt was able to place items in sequential order when given inferential clues with mod assist verbal cues to recognize and correct errors; max assist verbal cues were needed during another deductive reasoning puzzle due to increased cognitive load for working memory and organization. Pt was also able to identify ~50% of written errors during a functional problem solving task, which improved to 100% accuracy for error recognition with mod assist verbal and visual cues.  Pt was returned to room and left in bathroom with nurse tech present to assist in toileting.  Goals updated on this date to reflect current progress and plan of care.         Function:   Eating Eating                 Cognition Comprehension Comprehension assist level: Follows basic conversation/direction with extra time/assistive device  Expression   Expression assist level: Expresses complex 90% of the time/cues < 10% of the time  Social Interaction Social Interaction assist level: Interacts appropriately 90% of the time - Needs monitoring or encouragement for participation or interaction.  Problem Solving Problem solving assist level: Solves basic 50 - 74% of the time/requires cueing 25 - 49% of the time  Memory Memory assist level: Recognizes or recalls 50 - 74% of the time/requires cueing 25 - 49% of the time   General    Pain Pain Assessment Pain Assessment: No/denies pain  Therapy/Group: Individual Therapy  Liyat Faulkenberry, [name] 12/15/2015, 12:41 PM

## 2015-12-15 NOTE — Progress Notes (Signed)
Subjective/Complaints: Brief episode of CP yesterday evaluated by PA, impession was MSK related, occurred while sitting inchair but had been exercising arm in therapy earlier Pt states episode lasted a few minutes, no diaphoresis, no recurrent ROS: + Dysuria. Denies CP,SOB, N/V/D   Objective: Vital Signs: Blood pressure 141/87, pulse 68, temperature 98.5 F (36.9 C), temperature source Oral, resp. rate 18, weight 87.181 kg (192 lb 3.2 oz), SpO2 98 %. No results found. Results for orders placed or performed during the hospital encounter of 11/23/15 (from the past 72 hour(s))  CBC     Status: None   Collection Time: 12/13/15 12:15 PM  Result Value Ref Range   WBC 7.7 4.0 - 10.5 K/uL   RBC 4.52 4.22 - 5.81 MIL/uL   Hemoglobin 14.7 13.0 - 17.0 g/dL   HCT 42.7 39.0 - 52.0 %   MCV 94.5 78.0 - 100.0 fL   MCH 32.5 26.0 - 34.0 pg   MCHC 34.4 30.0 - 36.0 g/dL   RDW 12.3 11.5 - 15.5 %   Platelets 286 150 - 400 K/uL  Basic metabolic panel     Status: Abnormal   Collection Time: 12/13/15 12:15 PM  Result Value Ref Range   Sodium 138 135 - 145 mmol/L   Potassium 3.8 3.5 - 5.1 mmol/L   Chloride 98 (L) 101 - 111 mmol/L   CO2 28 22 - 32 mmol/L   Glucose, Bld 97 65 - 99 mg/dL   BUN 20 6 - 20 mg/dL   Creatinine, Ser 1.84 (H) 0.61 - 1.24 mg/dL   Calcium 10.4 (H) 8.9 - 10.3 mg/dL   GFR calc non Af Amer 42 (L) >60 mL/min   GFR calc Af Amer 49 (L) >60 mL/min    Comment: (NOTE) The eGFR has been calculated using the CKD EPI equation. This calculation has not been validated in all clinical situations. eGFR's persistently <60 mL/min signify possible Chronic Kidney Disease.    Anion gap 12 5 - 15    BP 141/87 mmHg  Pulse 68  Temp(Src) 98.5 F (36.9 C) (Oral)  Resp 18  Wt 87.181 kg (192 lb 3.2 oz)  SpO2 98% Gen NAD. Vital signs reviewed  HEENT:  Normocephalic, atraumatic Cardio: RRR and no murmur Resp: CTA B/L and unlabored GI: BS positive and NT, ND Musc/Skel:  No tenderness.  No  Edema Neuro: Alert and Oriented Sensation Absent to light touch left upper and left lower extremity Motor: [name]: Shoulder abduction 4+/5, elbow flexion/extension 4+/5, finger grip 4 +/5 [name]: 0/5 RUE/RLE: Antigravity strength Skin:   Intact. Warm and dry.  Assessment/Plan: 1. Functional deficits secondary to Right ACA infarct with Left hemiparesis  which require 3+ hours per day of interdisciplinary therapy in a comprehensive inpatient rehab setting. Physiatrist is providing close team supervision and 24 hour management of active medical problems listed below. Physiatrist and rehab team continue to assess barriers to discharge/monitor patient progress toward functional and medical goals. FIM: Function - Bathing Bathing activity did not occur: Refused Position: Shower Body parts bathed by patient: Left arm, Right arm, Chest, Abdomen, Front perineal area, Right upper leg, Right lower leg, Left lower leg, Left upper leg Body parts bathed by helper: Buttocks Bathing not applicable: Back Assist Level: Touching or steadying assistance(Pt > 75%)  Function- Upper Body Dressing/Undressing Upper body dressing/undressing activity did not occur: Refused What is the patient wearing?: Pull over shirt/dress Pull over shirt/dress - Perfomed by patient: Thread/unthread right sleeve, Thread/unthread left sleeve, Put head through opening, Pull  shirt over trunk Pull over shirt/dress - Perfomed by helper: Pull shirt over trunk Assist Level: Set up, Supervision or verbal cues Set up : To obtain clothing/put away Function - Lower Body Dressing/Undressing Lower body dressing/undressing activity did not occur: Refused What is the patient wearing?: Underwear, Pants, Non-skid slipper socks Position: Wheelchair/chair at sink Underwear - Performed by patient: Thread/unthread right underwear leg, Pull underwear up/down Underwear - Performed by helper: Thread/unthread left underwear leg Pants- Performed by patient:  Thread/unthread right pants leg, Thread/unthread left pants leg Pants- Performed by helper: Pull pants up/down Non-skid slipper socks- Performed by patient: Don/doff right sock Non-skid slipper socks- Performed by helper: Don/doff left sock Socks - Performed by patient: Don/doff right sock Socks - Performed by helper: Don/doff left sock Shoes - Performed by patient: Don/doff right shoe Shoes - Performed by helper: Don/doff left shoe, Fasten right, Fasten left Assist for footwear: Dependant Assist for lower body dressing: Touching or steadying assistance (Pt > 75%)  Function - Toileting Toileting activity did not occur: No continent bowel/bladder event Toileting steps completed by patient: Performs perineal hygiene, Adjust clothing prior to toileting, Adjust clothing after toileting Toileting steps completed by helper: Adjust clothing prior to toileting, Performs perineal hygiene, Adjust clothing after toileting Toileting Assistive Devices: Grab bar or rail Assist level: Touching or steadying assistance (Pt.75%)  Function - Air cabin crew transfer activity did not occur: N/A Toilet transfer assistive device: Mechanical lift Mechanical lift: Stedy Assist level to toilet: Touching or steadying assistance (Pt > 75%) Assist level from toilet: Touching or steadying assistance (Pt > 75%)  Function - Chair/bed transfer Chair/bed transfer method: Squat pivot Chair/bed transfer assist level: Moderate assist (Pt 50 - 74%/lift or lower) Chair/bed transfer assistive device: Armrests, Orthosis Chair/bed transfer details: Verbal cues for technique, Visual cues for safe use of DME/AE, Verbal cues for precautions/safety, Verbal cues for sequencing  Function - Locomotion: Wheelchair Will patient use wheelchair at discharge?: Yes Type: Manual Max wheelchair distance: 150' Assist Level: Supervision or verbal cues Assist Level: Supervision or verbal cues Assist Level: Supervision or verbal  cues Turns around,maneuvers to table,bed, and toilet,negotiates 3% grade,maneuvers on rugs and over doorsills: No Function - Locomotion: Ambulation Assistive device: Rail in hallway, Orthosis Max distance: 25 Assist level: 2 helpers (wc follow) Walk 10 feet activity did not occur: Safety/medical concerns Assist level: 2 helpers Walk 50 feet with 2 turns activity did not occur: Safety/medical concerns Walk 150 feet activity did not occur: Safety/medical concerns Walk 10 feet on uneven surfaces activity did not occur: Safety/medical concerns  Function - Comprehension Comprehension: Auditory Comprehension assist level: Follows basic conversation/direction with extra time/assistive device  Function - Expression Expression: Verbal Expression assist level: Expresses complex 90% of the time/cues < 10% of the time  Function - Social Interaction Social Interaction assist level: Interacts appropriately 90% of the time - Needs monitoring or encouragement for participation or interaction.  Function - Problem Solving Problem solving assist level: Solves basic less than 25% of the time - needs direction nearly all the time or does not effectively solve problems and may need a restraint for safety  Function - Memory Memory assist level: Recognizes or recalls 25 - 49% of the time/requires cueing 50 - 75% of the time Patient normally able to recall (first 3 days only): Current season, Location of own room, Staff names and faces, That he or she is in a hospital  Medical Problem List and Plan: 1.  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  DVT Prophylaxis/Anticoagulation: Mechanical: Sequential compression devices, below knee Bilateral lower extremities 3. Pain Management:  hydrocodone prn for pain, ad Kpad for low back 4. Mood: LCSW to follow for evaluation and support.      5. Neuropsych: This patient is not fully capable of making decisions on his own behalf. 6.  Skin/Wound Care: Routine pressure relief measures. Maintain adequate nutrition and hydration status.   7. Fluids/Electrolytes/Nutrition: Monitor I/O. Appetite remains good.   Hypokalemia:  boderline low at 3.4 cont po supplementation,recheck BMET 8.  HTN:  Norvasc, normodyne , chlorthalidone, Catapres TTS at max doses- NO ACE or diuretic given recent AKI  Catapres prn additionally for SBP > 180 or DBP >105 as per Neurology parameters Nephro consulted, appreciate recs  141/87 this am- acceptable reading  Will continue to monitor  9. Dyslipidemia: On Lipitor.   10. H/o depression with anxiety: Used to see mental health in the past. Has been off meds for years.   Started SSRI- Celexa on 12/27- appreciate Neuropsych follow up  low dose xanax d/ced 11. AKI/CKD: GFR 42 worsening likely due to  bactrim 12. OSA  CPAP at noc  13. 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

## 2015-12-15 NOTE — Progress Notes (Signed)
Occupational Therapy Session Note  Patient Details  Name: Taylor Obrien MRN: [id number] Date of Birth: [birth year]  Today's Date: 12/15/2015 OT Individual Time: 0903-1000 OT Individual Time Calculation (min): 57 min    Short Term Goals: Week 2:  OT Short Term Goal 1 (Week 2): STGs=LTGs secondary to estimated LOS Week 3:  OT Short Term Goal 1 (Week 3): Continue working on short term goals downgraded to min/mod assist level for discharge.   Skilled Therapeutic Interventions/Progress Updates:    1:1 self care retraining including grooming and dressing at sink. (Pt declined bathing today due to through shower yesterday). Focused on ability to orient clothing from folded and inside out position (the way they were in the drawer) with more than reasonable amt of time but was able to be successful. Focus on controlled use of left hand at non-dominant level with cues to release items at times. Pt required at times a to cross left LE over right knee to thread underwear and pants and A to sustain position. Pt required min VC for threading the correct side. Focus on [facility] and bilateral hand coordination with tying shoes. Pt with increased success with foot propped up on foot stool.  He was able to successfully tie left shoe after 3 attempts. Required A for right side fastening today. Performed sit to stands 4x with min A with multimodal cues for positioning at midline. Pt participated in grooming with focus on bilateral UE use.  Assisted with urinal with mod A. Left in w/c with quick release.    Therapy Documentation Precautions:  Precautions Precautions: Fall Precaution Comments: flaccid [name] and motor planning difficulties with the [name] Restrictions Weight Bearing Restrictions: No Pain:  no c/o pain this morning ADL: ADL ADL Comments: see Functional Tool  See Function Navigator for Current Functional Status.   Therapy/Group: Individual Therapy  Willeen Cass [facility] 12/15/2015, 9:25 AM

## 2015-12-15 NOTE — Progress Notes (Signed)
Physical Therapy Note  Patient Details  Name: Taylor Obrien MRN: [id number] Date of Birth: [birth year] Today's Date: 12/15/2015    Time: 1300-1428 88 minutes  1:1 no c/o pain.  Spoke with pt regarding home set up. Pt states he has a side door with only 1 STE. Pt would have to go through the grass to access this door but his wife is able to bump him up/down 1 step in w/c.  Pt agrees to pt/family education with his wife on bumping up/down step in w/c.  Met with orthotist and performed gait multiple attempts with blue rocker AFO. Pt better able to clear L LE and with more stability in stance with AFO. Pt educated by orthotist on wear and maintenance of AFO. Pt gait at railing in hallway x 5 with max A for wt shift and L LE clearance.  Tall kneeling with UEs on bench with focus on Lt hip strength and control with mod A of 2.  Pt able to get glute initiation with reaching tasks and tactile cues.  Prone hip flexor stretch x 3 minutes.  Stair negotiation x 4 stairs 6''.  Pt requires +2 assist for safety, manual facilitation for wt shifting and L LE control, cues for safety.  Pt improving and getting trace return in Lt hamstrings and glutes.   Salisha Bardsley 12/15/2015, 2:28 PM

## 2015-12-16 ENCOUNTER — Inpatient Hospital Stay (HOSPITAL_COMMUNITY): Payer: Medicaid Other | Admitting: Physical Therapy

## 2015-12-16 NOTE — Progress Notes (Signed)
Physical Therapy Session Note  Patient Details  Name: Taylor Obrien MRN: [id number] Date of Birth: 1965-12-16  Today's Date: 12/16/2015 PT Individual Time: 1000-1030 PT Individual Time Calculation (min): 30 min   Short Term Goals: Week 1:  PT Short Term Goal 1 (Week 1): Pt will be able to demonstrate static sitting balance at midline with mod assist x 5 min PT Short Term Goal 1 - Progress (Week 1): Met PT Short Term Goal 2 (Week 1): Pt will be able to perform basic transfers on level surfaces with max assist of 1 PT Short Term Goal 2 - Progress (Week 1): Met PT Short Term Goal 3 (Week 1): Pt will be able to attend to [name] and [name] during transfers 50% of the time PT Short Term Goal 3 - Progress (Week 1): Met PT Short Term Goal 4 (Week 1): Pt will be able to propel w/c with hemitechnique x 50' with min assist PT Short Term Goal 4 - Progress (Week 1): Met Week 2:  PT Short Term Goal 1 (Week 2): Pt will consistently perform bed <> chair transfers with min A PT Short Term Goal 1 - Progress (Week 2): Not met PT Short Term Goal 2 (Week 2): Pt will perform gait 20' with +2 assist PT Short Term Goal 2 - Progress (Week 2): Met PT Short Term Goal 3 (Week 2): Pt will demo standing balance for functional task with max A PT Short Term Goal 3 - Progress (Week 2): Met  Skilled Therapeutic Interventions/Progress Updates:   [name] with limited active weight bearing and supinates at rest in sitting limiting transfers. Pt benefits most from self approximation through [name] with BUE to increase [name] weightbearing in 1/2 stand. Pt would continue to benefit from skilled PT services to increase functional mobility.  Therapy Documentation Precautions:  Precautions Precautions: Fall Precaution Comments: hypotonic [name] and motor planning difficulties with the [name] Restrictions Weight Bearing Restrictions: No Vital Signs: Therapy Vitals Temp: 98.5 F (36.9 C) Temp Source: Oral Pulse Rate: 67 Resp: 20 BP: (!)  142/86 mmHg Patient Position (if appropriate): Lying Oxygen Therapy SpO2: 100 % O2 Device: Not Delivered Pain: Pain Assessment Pain Assessment: 0-10 Pain Score: 3  Pain Location: Arm Pain Orientation: Right Mobility:  Mod A transfers with cues for weight shift, sequencing, and technique Other Treatments:  Pt performs anterior weight shifts with BUE approximation through [name], partial sit to stands with BUE approximation through [name], hip abd with orange theraband, 1/2 standing weight shifts with BUE approximation weight shifts. Pt educated on forced use, weight shift, and progressing mobility. Transfers x10 in session. Static standing 1'x2 in session.    See Function Navigator for Current Functional Status.   Therapy/Group: Individual Therapy  Monia Pouch 12/16/2015, 10:12 AM

## 2015-12-16 NOTE — Progress Notes (Signed)
Subjective/Complaints: No further CP, slept well 4 BMs after prune juice yesterday ROS: + Dysuria. Denies CP,SOB, N/V/D   Objective: Vital Signs: Blood pressure 142/86, pulse 67, temperature 98.5 F (36.9 C), temperature source Oral, resp. rate 20, weight 87.181 kg (192 lb 3.2 oz), SpO2 100 %. No results found. Results for orders placed or performed during the hospital encounter of 11/23/15 (from the past 72 hour(s))  CBC     Status: None   Collection Time: 12/13/15 12:15 PM  Result Value Ref Range   WBC 7.7 4.0 - 10.5 K/uL   RBC 4.52 4.22 - 5.81 MIL/uL   Hemoglobin 14.7 13.0 - 17.0 g/dL   HCT 42.7 39.0 - 52.0 %   MCV 94.5 78.0 - 100.0 fL   MCH 32.5 26.0 - 34.0 pg   MCHC 34.4 30.0 - 36.0 g/dL   RDW 12.3 11.5 - 15.5 %   Platelets 286 150 - 400 K/uL  Basic metabolic panel     Status: Abnormal   Collection Time: 12/13/15 12:15 PM  Result Value Ref Range   Sodium 138 135 - 145 mmol/L   Potassium 3.8 3.5 - 5.1 mmol/L   Chloride 98 (L) 101 - 111 mmol/L   CO2 28 22 - 32 mmol/L   Glucose, Bld 97 65 - 99 mg/dL   BUN 20 6 - 20 mg/dL   Creatinine, Ser 1.84 (H) 0.61 - 1.24 mg/dL   Calcium 10.4 (H) 8.9 - 10.3 mg/dL   GFR calc non Af Amer 42 (L) >60 mL/min   GFR calc Af Amer 49 (L) >60 mL/min    Comment: (NOTE) The eGFR has been calculated using the CKD EPI equation. This calculation has not been validated in all clinical situations. eGFR's persistently <60 mL/min signify possible Chronic Kidney Disease.    Anion gap 12 5 - 15    BP 142/86 mmHg  Pulse 67  Temp(Src) 98.5 F (36.9 C) (Oral)  Resp 20  Wt 87.181 kg (192 lb 3.2 oz)  SpO2 100% Gen NAD. Vital signs reviewed  HEENT:  Normocephalic, atraumatic Cardio: RRR and no murmur Resp: CTA B/L and unlabored GI: BS positive and NT, ND Musc/Skel:  No tenderness.  No Edema Neuro: Alert and Oriented Sensation Absent to light touch left upper and left lower extremity Motor: [name]: Shoulder abduction 4+/5, elbow flexion/extension  4+/5, finger grip 4 +/5 [name]: 0/5 RUE/RLE: Antigravity strength Skin:   Intact. Warm and dry.  Assessment/Plan: 1. Functional deficits secondary to Right ACA infarct with Left hemiparesis  which require 3+ hours per day of interdisciplinary therapy in a comprehensive inpatient rehab setting. Physiatrist is providing close team supervision and 24 hour management of active medical problems listed below. Physiatrist and rehab team continue to assess barriers to discharge/monitor patient progress toward functional and medical goals. FIM: Function - Bathing Bathing activity did not occur: Refused Position: Shower Body parts bathed by patient: Left arm, Right arm, Chest, Abdomen, Front perineal area, Right upper leg, Right lower leg, Left lower leg, Left upper leg Body parts bathed by helper: Buttocks Bathing not applicable: Back Assist Level: Touching or steadying assistance(Pt > 75%)  Function- Upper Body Dressing/Undressing Upper body dressing/undressing activity did not occur: Refused What is the patient wearing?: Pull over shirt/dress Pull over shirt/dress - Perfomed by patient: Thread/unthread right sleeve, Thread/unthread left sleeve, Put head through opening, Pull shirt over trunk Pull over shirt/dress - Perfomed by helper: Pull shirt over trunk Assist Level: Set up, Supervision or verbal cues Set up :  To obtain clothing/put away Function - Lower Body Dressing/Undressing Lower body dressing/undressing activity did not occur: Refused What is the patient wearing?: Underwear, Pants, Socks, Shoes Position: Wheelchair/chair at sink Underwear - Performed by patient: Thread/unthread right underwear leg, Pull underwear up/down Underwear - Performed by helper: Thread/unthread left underwear leg Pants- Performed by patient: Thread/unthread right pants leg, Thread/unthread left pants leg, Pull pants up/down Pants- Performed by helper: Pull pants up/down Non-skid slipper socks- Performed by  patient: Don/doff right sock Non-skid slipper socks- Performed by helper: Don/doff left sock Socks - Performed by patient: Don/doff right sock, Don/doff left sock Socks - Performed by helper: Don/doff left sock Shoes - Performed by patient: Don/doff right shoe, Don/doff left shoe, Fasten left Shoes - Performed by helper: Fasten right Assist for footwear: Supervision/touching assist Assist for lower body dressing: Touching or steadying assistance (Pt > 75%)  Function - Toileting Toileting activity did not occur: No continent bowel/bladder event Toileting steps completed by patient: Performs perineal hygiene, Adjust clothing prior to toileting, Adjust clothing after toileting Toileting steps completed by helper: Adjust clothing prior to toileting, Performs perineal hygiene, Adjust clothing after toileting Toileting Assistive Devices: Grab bar or rail Assist level: Touching or steadying assistance (Pt.75%)  Function - Toilet Transfers Toilet transfer activity did not occur: N/A Toilet transfer assistive device: Mechanical lift Mechanical lift: Stedy Assist level to toilet: Touching or steadying assistance (Pt > 75%) Assist level from toilet: Touching or steadying assistance (Pt > 75%)  Function - Chair/bed transfer Chair/bed transfer method: Squat pivot Chair/bed transfer assist level: Moderate assist (Pt 50 - 74%/lift or lower) Chair/bed transfer assistive device: Armrests, Orthosis Chair/bed transfer details: Verbal cues for technique, Visual cues for safe use of DME/AE, Verbal cues for precautions/safety, Verbal cues for sequencing  Function - Locomotion: Wheelchair Will patient use wheelchair at discharge?: Yes Type: Manual Max wheelchair distance: 150' Assist Level: Supervision or verbal cues Assist Level: Supervision or verbal cues Assist Level: Supervision or verbal cues Turns around,maneuvers to table,bed, and toilet,negotiates 3% grade,maneuvers on rugs and over doorsills:  No Function - Locomotion: Ambulation Assistive device: Rail in hallway, Orthosis Max distance: 25 Assist level: 2 helpers (wc follow) Walk 10 feet activity did not occur: Safety/medical concerns Assist level: 2 helpers Walk 50 feet with 2 turns activity did not occur: Safety/medical concerns Walk 150 feet activity did not occur: Safety/medical concerns Walk 10 feet on uneven surfaces activity did not occur: Safety/medical concerns  Function - Comprehension Comprehension: Auditory Comprehension assist level: Follows basic conversation/direction with extra time/assistive device  Function - Expression Expression: Verbal Expression assist level: Expresses complex 90% of the time/cues < 10% of the time  Function - Social Interaction Social Interaction assist level: Interacts appropriately 90% of the time - Needs monitoring or encouragement for participation or interaction.  Function - Problem Solving Problem solving assist level: Solves basic 50 - 74% of the time/requires cueing 25 - 49% of the time  Function - Memory Memory assist level: Recognizes or recalls 50 - 74% of the time/requires cueing 25 - 49% of the time Patient normally able to recall (first 3 days only): Current season, Location of own room, Staff names and faces, That he or she is in a hospital  Medical Problem List and Plan: 1.  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  DVT Prophylaxis/Anticoagulation: Mechanical: Sequential compression devices, below knee Bilateral lower extremities 3. Pain Management:  hydrocodone prn for pain, ad Kpad for low back, MSK CP resolved 4. Mood: LCSW to follow  for evaluation and support.      5. Neuropsych: This patient is not fully capable of making decisions on his own behalf. 6. Skin/Wound Care: Routine pressure relief measures. Maintain adequate nutrition and hydration status.   7. Fluids/Electrolytes/Nutrition: Monitor I/O. Appetite remains good.   Hypokalemia:  boderline  low at 3.4 cont po supplementation,recheck BMET 8.  HTN:  Norvasc, normodyne , chlorthalidone, Catapres TTS at max doses- NO ACE or diuretic given recent AKI  Catapres prn additionally for SBP > 180 or DBP >105 as per Neurology parameters Nephro consulted, appreciate recs  142/86 this am- no med adjustment   9. Dyslipidemia: On Lipitor.   10. H/o depression with anxiety: Used to see mental health in the past. Has been off meds for years.   Started SSRI- Celexa on 12/27- appreciate Neuropsych follow up  low dose xanax d/ced 11. AKI/CKD: GFR  worsening likely due to  Bactrim will recheck after switch to Cipro 12. OSA  CPAP at noc  13. 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

## 2015-12-17 ENCOUNTER — Inpatient Hospital Stay (HOSPITAL_COMMUNITY): Payer: Medicaid Other | Admitting: Physical Therapy

## 2015-12-17 LAB — BASIC METABOLIC PANEL
ANION GAP: 10 (ref 5–15)
BUN: 17 mg/dL (ref 6–20)
CHLORIDE: 96 mmol/L — AB (ref 101–111)
CO2: 29 mmol/L (ref 22–32)
CREATININE: 1.97 mg/dL — AB (ref 0.61–1.24)
Calcium: 9.9 mg/dL (ref 8.9–10.3)
GFR calc non Af Amer: 39 mL/min — ABNORMAL LOW (ref >60)
GFR, EST AFRICAN AMERICAN: 45 mL/min — AB (ref >60)
Glucose, Bld: 106 mg/dL — ABNORMAL HIGH (ref 65–99)
POTASSIUM: 3.6 mmol/L (ref 3.5–5.1)
SODIUM: 135 mmol/L (ref 135–145)

## 2015-12-17 MED ORDER — CIPROFLOXACIN HCL 250 MG PO TABS
250.0000 mg | ORAL_TABLET | Freq: Two times a day (BID) | ORAL | Status: DC
  Administered 2015-12-17 – 2015-12-20 (×7): 250 mg via ORAL
  Filled 2015-12-17 (×7): qty 1

## 2015-12-17 NOTE — Progress Notes (Signed)
Subjective/Complaints: L Leg shakes when he yawns ROS: + Dysuria. Denies CP,SOB, N/V/D   Objective: Vital Signs: Blood pressure 150/97, pulse 68, temperature 97.7 F (36.5 C), temperature source Oral, resp. rate 18, weight 87.181 kg (192 lb 3.2 oz), SpO2 100 %. No results found. Results for orders placed or performed during the hospital encounter of 11/23/15 (from the past 72 hour(s))  Basic metabolic panel     Status: Abnormal   Collection Time: 12/17/15  6:06 AM  Result Value Ref Range   Sodium 135 135 - 145 mmol/L   Potassium 3.6 3.5 - 5.1 mmol/L   Chloride 96 (L) 101 - 111 mmol/L   CO2 29 22 - 32 mmol/L   Glucose, Bld 106 (H) 65 - 99 mg/dL   BUN 17 6 - 20 mg/dL   Creatinine, Ser 1.97 (H) 0.61 - 1.24 mg/dL   Calcium 9.9 8.9 - 10.3 mg/dL   GFR calc non Af Amer 39 (L) >60 mL/min   GFR calc Af Amer 45 (L) >60 mL/min    Comment: (NOTE) The eGFR has been calculated using the CKD EPI equation. This calculation has not been validated in all clinical situations. eGFR's persistently <60 mL/min signify possible Chronic Kidney Disease.    Anion gap 10 5 - 15    BP 150/97 mmHg  Pulse 68  Temp(Src) 97.7 F (36.5 C) (Oral)  Resp 18  Wt 87.181 kg (192 lb 3.2 oz)  SpO2 100% Gen NAD. Vital signs reviewed  HEENT:  Normocephalic, atraumatic Cardio: RRR and no murmur Resp: CTA B/L and unlabored GI: BS positive and NT, ND Musc/Skel:  No tenderness.  No Edema Neuro: Alert and Oriented Sensation Absent to light touch left upper and left lower extremity Motor: [name]: Shoulder abduction 4+/5, elbow flexion/extension 4+/5, finger grip 4 +/5 [name]: 0/5 RUE/RLE: Antigravity strength Skin:   Intact. Warm and dry.  Assessment/Plan: 1. Functional deficits secondary to Right ACA infarct with Left hemiparesis  which require 3+ hours per day of interdisciplinary therapy in a comprehensive inpatient rehab setting. Physiatrist is providing close team supervision and 24 hour management of active  medical problems listed below. Physiatrist and rehab team continue to assess barriers to discharge/monitor patient progress toward functional and medical goals. FIM: Function - Bathing Bathing activity did not occur: Refused Position: Shower Body parts bathed by patient: Left arm, Right arm, Chest, Abdomen, Front perineal area, Right upper leg, Right lower leg, Left lower leg, Left upper leg Body parts bathed by helper: Buttocks Bathing not applicable: Back Assist Level: Touching or steadying assistance(Pt > 75%)  Function- Upper Body Dressing/Undressing Upper body dressing/undressing activity did not occur: Refused What is the patient wearing?: Pull over shirt/dress Pull over shirt/dress - Perfomed by patient: Thread/unthread right sleeve, Thread/unthread left sleeve, Put head through opening, Pull shirt over trunk Pull over shirt/dress - Perfomed by helper: Pull shirt over trunk Assist Level: Set up, Supervision or verbal cues Set up : To obtain clothing/put away Function - Lower Body Dressing/Undressing Lower body dressing/undressing activity did not occur: Refused What is the patient wearing?: Underwear, Pants, Socks, Shoes Position: Wheelchair/chair at sink Underwear - Performed by patient: Thread/unthread right underwear leg, Pull underwear up/down Underwear - Performed by helper: Thread/unthread left underwear leg Pants- Performed by patient: Thread/unthread right pants leg, Thread/unthread left pants leg, Pull pants up/down Pants- Performed by helper: Pull pants up/down Non-skid slipper socks- Performed by patient: Don/doff right sock Non-skid slipper socks- Performed by helper: Don/doff left sock Socks - Performed by  patient: Don/doff right sock, Don/doff left sock Socks - Performed by helper: Don/doff left sock Shoes - Performed by patient: Don/doff right shoe, Don/doff left shoe, Fasten left Shoes - Performed by helper: Fasten right Assist for footwear: Supervision/touching  assist Assist for lower body dressing: Touching or steadying assistance (Pt > 75%)  Function - Toileting Toileting activity did not occur: No continent bowel/bladder event Toileting steps completed by patient: Performs perineal hygiene, Adjust clothing prior to toileting, Adjust clothing after toileting Toileting steps completed by helper: Adjust clothing prior to toileting, Performs perineal hygiene, Adjust clothing after toileting Toileting Assistive Devices: Grab bar or rail Assist level: Touching or steadying assistance (Pt.75%)  Function - Toilet Transfers Toilet transfer activity did not occur: N/A Toilet transfer assistive device: Mechanical lift Mechanical lift: Stedy Assist level to toilet: Touching or steadying assistance (Pt > 75%) Assist level from toilet: Touching or steadying assistance (Pt > 75%)  Function - Chair/bed transfer Chair/bed transfer method: Squat pivot Chair/bed transfer assist level: Moderate assist (Pt 50 - 74%/lift or lower) Chair/bed transfer assistive device: Armrests, Orthosis Chair/bed transfer details: Verbal cues for technique, Visual cues for safe use of DME/AE, Verbal cues for precautions/safety, Verbal cues for sequencing  Function - Locomotion: Wheelchair Will patient use wheelchair at discharge?: Yes Type: Manual Max wheelchair distance: 150' Assist Level: Supervision or verbal cues Assist Level: Supervision or verbal cues Assist Level: Supervision or verbal cues Turns around,maneuvers to table,bed, and toilet,negotiates 3% grade,maneuvers on rugs and over doorsills: No Function - Locomotion: Ambulation Assistive device: Rail in hallway, Orthosis Max distance: 25 Assist level:  (wc follow) Walk 10 feet activity did not occur: Safety/medical concerns Assist level: 2 helpers Walk 50 feet with 2 turns activity did not occur: Safety/medical concerns Walk 150 feet activity did not occur: Safety/medical concerns Walk 10 feet on uneven  surfaces activity did not occur: Safety/medical concerns  Function - Comprehension Comprehension: Auditory Comprehension assist level: Follows basic conversation/direction with extra time/assistive device  Function - Expression Expression: Verbal Expression assist level: Expresses complex 90% of the time/cues < 10% of the time  Function - Social Interaction Social Interaction assist level: Interacts appropriately 90% of the time - Needs monitoring or encouragement for participation or interaction.  Function - Problem Solving Problem solving assist level: Solves basic 50 - 74% of the time/requires cueing 25 - 49% of the time  Function - Memory Memory assist level: Recognizes or recalls 50 - 74% of the time/requires cueing 25 - 49% of the time Patient normally able to recall (first 3 days only): Current season, Location of own room, Staff names and faces, That he or she is in a hospital  Medical Problem List and Plan: 1.  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  DVT Prophylaxis/Anticoagulation: Mechanical: Sequential compression devices, below knee Bilateral lower extremities 3. Pain Management:  hydrocodone prn for pain, ad Kpad for low back, MSK CP resolved 4. Mood: LCSW to follow for evaluation and support.      5. Neuropsych: This patient is not fully capable of making decisions on his own behalf. 6. Skin/Wound Care: Routine pressure relief measures. Maintain adequate nutrition and hydration status.   7. Fluids/Electrolytes/Nutrition: Monitor I/O. Appetite remains good.   Hypokalemia:  boderline low at 3.6 cont po supplementation,recheck BMET in am 8.  HTN:  Norvasc, normodyne , chlorthalidone, Catapres TTS at max doses- NO ACE or diuretic given recent AKI  Catapres prn additionally for SBP > 180 or DBP >105 as per Neurology parameters Nephro  consulted, appreciate recs  150/97 this am- no med adjustment   9. Dyslipidemia: On Lipitor.   10. H/o depression with anxiety:  Used to see mental health in the past. Has been off meds for years.   Started SSRI- Celexa on 12/27- appreciate Neuropsych follow up  low dose xanax d/ced 11. AKI/CKD: GFR  worsening likely due to  Bactrim will recheck after switch to Cipro, BMET in am, may need nephro f/u 12. OSA  CPAP at noc  13. 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

## 2015-12-17 NOTE — Progress Notes (Signed)
Occupational Therapy Note  Patient Details  Name: ALTON SEIVERT MRN: [id number] Date of Birth: [birth year]  Today's Date: 12/17/2015 OT Individual Time: 1530-1600 OT Individual Time Calculation (min): 30 min   Pt denied pain Individual therapy  Pt engaged in [name] [facility] and gross motor tasks with focus on controlled movements.  Activities included stacking cups, placing small pegs in peg board, stacking checkers, and flipping and stacking cards.  Pt's wife present and provided appropriate level of supervision and direction.   Leotis Shames [facility] 12/17/2015, 4:13 PM

## 2015-12-18 ENCOUNTER — Inpatient Hospital Stay (HOSPITAL_COMMUNITY): Payer: Medicaid Other | Admitting: Occupational Therapy

## 2015-12-18 ENCOUNTER — Inpatient Hospital Stay (HOSPITAL_COMMUNITY): Payer: Medicaid Other | Admitting: Speech Pathology

## 2015-12-18 ENCOUNTER — Inpatient Hospital Stay (HOSPITAL_COMMUNITY): Payer: Medicaid Other

## 2015-12-18 LAB — URINALYSIS, ROUTINE W REFLEX MICROSCOPIC
BILIRUBIN URINE: NEGATIVE
Glucose, UA: NEGATIVE mg/dL
Ketones, ur: 15 mg/dL — AB
Leukocytes, UA: NEGATIVE
NITRITE: NEGATIVE
PH: 5.5 (ref 5.0–8.0)
Protein, ur: 100 mg/dL — AB

## 2015-12-18 LAB — URINE MICROSCOPIC-ADD ON

## 2015-12-18 MED ORDER — CHLORTHALIDONE 50 MG PO TABS
50.0000 mg | ORAL_TABLET | Freq: Every day | ORAL | Status: DC
  Administered 2015-12-18 – 2015-12-19 (×2): 50 mg via ORAL
  Filled 2015-12-18 (×3): qty 1

## 2015-12-18 MED ORDER — HYDROCODONE-ACETAMINOPHEN 7.5-325 MG PO TABS
1.0000 | ORAL_TABLET | Freq: Four times a day (QID) | ORAL | Status: DC | PRN
  Administered 2015-12-18 – 2015-12-20 (×5): 1 via ORAL
  Filled 2015-12-18 (×6): qty 1

## 2015-12-18 NOTE — Progress Notes (Signed)
Occupational Therapy Session Note  Patient Details  Name: Taylor Obrien MRN: [id number] Date of Birth: [birth year]  Today's Date: 12/18/2015 OT Individual Time: 1300-1329 OT Individual Time Calculation (min): 29 min    Skilled Therapeutic Interventions/Progress Updates:    Educated pt on FM coordination activities and provided handout for pt and pt's spouse.  Pt and spouse both voice understanding.  Discussed progression of exercises as well such as progressing from picking up larger checkers to picking up coins.  Next had pt work on the [employer] down in the gym.  He completed 3 sets of 3 mins each.  First 2 sets performed with resistance on level 9 using just the [name].  He then complete one last set using both UEs on level 15 resistance.  All sets were completed with RPMs at level 25 or greater.  Pt still with significant difficulty advancing the wheelchair and coordinating BUE movement when rolling himself back to the room.  Pt left with spouse, still up in the wheelchair with call button within reach.    Therapy Documentation Precautions:  Precautions Precautions: Fall Precaution Comments: hypotonic [name] and motor planning difficulties with the [name] Restrictions Weight Bearing Restrictions: No  Pain: Pain Assessment Pain Assessment: No/denies pain Pain Score: 1  Pain Type: Acute pain Pain Location: Back Pain Orientation: Left Pain Radiating Towards: leg Pain Descriptors / Indicators: Aching;Discomfort Pain Frequency: Occasional Pain Onset: Gradual Patients Stated Pain Goal: 3 Pain Intervention(s): Medication (See eMAR) ADL: See Function Navigator for Current Functional Status.   Therapy/Group: Individual Therapy  Tremond Shimabukuro OTR/L 12/18/2015, 3:46 PM

## 2015-12-18 NOTE — Progress Notes (Signed)
Subjective/Complaints: Slept ok, no new issues     ROS: + Dysuria. Denies CP,SOB, N/V/D   Objective: Vital Signs: Blood pressure 146/80, pulse 76, temperature 97.4 F (36.3 C), temperature source Oral, resp. rate 18, weight 87.181 kg (192 lb 3.2 oz), SpO2 99 %. No results found. Results for orders placed or performed during the hospital encounter of 11/23/15 (from the past 72 hour(s))  Basic metabolic panel     Status: Abnormal   Collection Time: 12/17/15  6:06 AM  Result Value Ref Range   Sodium 135 135 - 145 mmol/L   Potassium 3.6 3.5 - 5.1 mmol/L   Chloride 96 (L) 101 - 111 mmol/L   CO2 29 22 - 32 mmol/L   Glucose, Bld 106 (H) 65 - 99 mg/dL   BUN 17 6 - 20 mg/dL   Creatinine, Ser 1.97 (H) 0.61 - 1.24 mg/dL   Calcium 9.9 8.9 - 10.3 mg/dL   GFR calc non Af Amer 39 (L) >60 mL/min   GFR calc Af Amer 45 (L) >60 mL/min    Comment: (NOTE) The eGFR has been calculated using the CKD EPI equation. This calculation has not been validated in all clinical situations. eGFR's persistently <60 mL/min signify possible Chronic Kidney Disease.    Anion gap 10 5 - 15    BP 146/80 mmHg  Pulse 76  Temp(Src) 97.4 F (36.3 C) (Oral)  Resp 18  Wt 87.181 kg (192 lb 3.2 oz)  SpO2 99% Gen NAD. Vital signs reviewed  HEENT:  Normocephalic, atraumatic Cardio: RRR and no murmur Resp: CTA B/L and unlabored GI: BS positive and NT, ND Musc/Skel:  No tenderness.  No Edema Neuro: Alert and Oriented Sensation Absent to light touch left upper and left lower extremity Motor: [name]: Shoulder abduction 4+/5, elbow flexion/extension 4+/5, finger grip 4 +/5 [name]: 0/5 RUE/RLE: Antigravity strength Skin:   Intact. Warm and dry.  Assessment/Plan: 1. Functional deficits secondary to Right ACA infarct with Left hemiparesis  which require 3+ hours per day of interdisciplinary therapy in a comprehensive inpatient rehab setting. Physiatrist is providing close team supervision and 24 hour management of active medical  problems listed below. Physiatrist and rehab team continue to assess barriers to discharge/monitor patient progress toward functional and medical goals. FIM: Function - Bathing Bathing activity did not occur: Refused Position: Shower Body parts bathed by patient: Left arm, Right arm, Chest, Abdomen, Front perineal area, Right upper leg, Right lower leg, Left lower leg, Left upper leg Body parts bathed by helper: Buttocks Bathing not applicable: Back Assist Level: Touching or steadying assistance(Pt > 75%)  Function- Upper Body Dressing/Undressing Upper body dressing/undressing activity did not occur: Refused What is the patient wearing?: Pull over shirt/dress Pull over shirt/dress - Perfomed by patient: Thread/unthread right sleeve, Thread/unthread left sleeve, Put head through opening, Pull shirt over trunk Pull over shirt/dress - Perfomed by helper: Pull shirt over trunk Assist Level: Set up, Supervision or verbal cues Set up : To obtain clothing/put away Function - Lower Body Dressing/Undressing Lower body dressing/undressing activity did not occur: Refused What is the patient wearing?: Underwear, Pants, Socks, Shoes Position: Wheelchair/chair at sink Underwear - Performed by patient: Thread/unthread right underwear leg, Pull underwear up/down Underwear - Performed by helper: Thread/unthread left underwear leg Pants- Performed by patient: Thread/unthread right pants leg, Thread/unthread left pants leg, Pull pants up/down Pants- Performed by helper: Pull pants up/down Non-skid slipper socks- Performed by patient: Don/doff right sock Non-skid slipper socks- Performed by helper: Don/doff left sock Socks -  Performed by patient: Don/doff right sock, Don/doff left sock Socks - Performed by helper: Don/doff left sock Shoes - Performed by patient: Don/doff right shoe, Don/doff left shoe, Fasten left Shoes - Performed by helper: Fasten right Assist for footwear: Supervision/touching  assist Assist for lower body dressing: Touching or steadying assistance (Pt > 75%)  Function - Toileting Toileting activity did not occur: No continent bowel/bladder event Toileting steps completed by patient: Performs perineal hygiene, Adjust clothing prior to toileting, Adjust clothing after toileting Toileting steps completed by helper: Adjust clothing prior to toileting, Performs perineal hygiene, Adjust clothing after toileting Toileting Assistive Devices: Grab bar or rail Assist level: Touching or steadying assistance (Pt.75%)  Function - Toilet Transfers Toilet transfer activity did not occur: N/A Toilet transfer assistive device: Mechanical lift Mechanical lift: Stedy Assist level to toilet: Touching or steadying assistance (Pt > 75%) Assist level from toilet: Touching or steadying assistance (Pt > 75%)  Function - Chair/bed transfer Chair/bed transfer method: Squat pivot Chair/bed transfer assist level: Moderate assist (Pt 50 - 74%/lift or lower) Chair/bed transfer assistive device: Armrests, Orthosis Chair/bed transfer details: Verbal cues for technique, Visual cues for safe use of DME/AE, Verbal cues for precautions/safety, Verbal cues for sequencing  Function - Locomotion: Wheelchair Will patient use wheelchair at discharge?: Yes Type: Manual Max wheelchair distance: 150' Assist Level: Supervision or verbal cues Assist Level: Supervision or verbal cues Assist Level: Supervision or verbal cues Turns around,maneuvers to table,bed, and toilet,negotiates 3% grade,maneuvers on rugs and over doorsills: No Function - Locomotion: Ambulation Assistive device: Rail in hallway, Orthosis Max distance: 25 Assist level:  (wc follow) Walk 10 feet activity did not occur: Safety/medical concerns Assist level: 2 helpers Walk 50 feet with 2 turns activity did not occur: Safety/medical concerns Walk 150 feet activity did not occur: Safety/medical concerns Walk 10 feet on uneven  surfaces activity did not occur: Safety/medical concerns  Function - Comprehension Comprehension: Auditory Comprehension assist level: Follows basic conversation/direction with extra time/assistive device  Function - Expression Expression: Verbal Expression assist level: Expresses complex 90% of the time/cues < 10% of the time  Function - Social Interaction Social Interaction assist level: Interacts appropriately 90% of the time - Needs monitoring or encouragement for participation or interaction.  Function - Problem Solving Problem solving assist level: Solves basic 50 - 74% of the time/requires cueing 25 - 49% of the time  Function - Memory Memory assist level: Recognizes or recalls 50 - 74% of the time/requires cueing 25 - 49% of the time Patient normally able to recall (first 3 days only): Current season, Location of own room, Staff names and faces, That he or she is in a hospital  Medical Problem List and Plan: 1.  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  DVT Prophylaxis/Anticoagulation: Mechanical: Sequential compression devices, below knee Bilateral lower extremities 3. Pain Management:  hydrocodone prn for pain, ad Kpad for low back, MSK CP resolved 4. Mood: LCSW to follow for evaluation and support.      5. Neuropsych: This patient is not fully capable of making decisions on his own behalf. 6. Skin/Wound Care: Routine pressure relief measures. Maintain adequate nutrition and hydration status.   7. Fluids/Electrolytes/Nutrition: Monitor I/O. Appetite remains good.   Hypokalemia:  boderline low at 3.6 cont po supplementation,recheck BMET today 8.  HTN:  Norvasc, normodyne , chlorthalidone, Catapres TTS at max doses- NO ACE or diuretic given recent AKI  Catapres prn additionally for SBP > 180 or DBP >105 as per Neurology parameters  Nephro consulted, appreciate recs  146/80 this am- no med adjustment   9. Dyslipidemia: On Lipitor.   10. H/o depression with anxiety:  Used to see mental health in the past. Has been off meds for years.   Started SSRI- Celexa on 12/27- appreciate Neuropsych follow up  low dose xanax d/ced 11. AKI/CKD: GFR  worsening likely due to  Bactrim will recheck after switch to Cipro, BMET Pending- Labs late today, ask Nephro to re eval 12. OSA  CPAP at noc-Pt with variable compliance  13. 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

## 2015-12-18 NOTE — Progress Notes (Signed)
Social Work Patient ID: Taylor Obrien, male   DOB: 26-Oct-1966, 50 y.o.   MRN: [id number] Met with pt and wife who was here for family education today to prepare for his discharge Wed. Both feel it went well and wife is comfortable with his care needs. Equipment coming to room tomorrow and follow up arranged via [facility]. MD offices are closed today due to bad weather. Will contact tomorrow to set up follow up appointment with MD office. Pt on [facility] waiting list in [place], [place] sent last week. Work toward discharge for [employer].

## 2015-12-18 NOTE — Progress Notes (Signed)
  [facility] Progress Note    Assessment/ Plan:   Admitted 11/23/2015 with CVA and poorly controlled HTN, had been started on Norvasc 10mg  daily and chlorthalidone + clonidine patch and labetolol 200mg  TID w/ intermittent drops in BP leading to ischemic ATN. Now reconsulted bec of bump in creatinine. I don't see any obvious offenders.   1. HTN- is on amlodipine 10 daily- admin at 8 AM -chlorthalidone 50 daily at 10 AM- clonidine #3 patch weekly 0.3mg  also labetalol 200 TID - had dips that were too low for him and gave him some ATN-seems to have stabilized out some. - BP seems to be consistently high in the morning which suggests that he may not have a nocturnal dip in BP. - Will change chlorthalidone to 50mg  at 5pm to help stabilize the BP. 2. Renal- creatinine bumped up to 1.9 this hosp; likely hemodynamic. Only offending agent would be cipro but would not stop that as of yet.  - Will first recheck a U/A. Previous U/A with only 100 of protein and granular casts and minimal RBC/WBC.  SPEP neg.  - Variation in Cr likely  due to variability in BP --> will follow chemistry panels to see if it settles.  Subjective:    No complaints. Denies f/c/n/v. Good appetite. No dizziness or diplopia.   Objective:   BP 146/80 mmHg  Pulse 76  Temp(Src) 97.4 F (36.3 C) (Oral)  Resp 18  Wt 87.181 kg (192 lb 3.2 oz)  SpO2 99%  Intake/Output Summary (Last 24 hours) at 12/18/15 1323 Last data filed at 12/18/15 0936  Gross per 24 hour  Intake    480 ml  Output    575 ml  Net    -95 ml   Weight change:   Physical Exam: General: NAD  Heart: RRR Lungs: clear Abdomen: soft, non tender Extremities: no edema  Imaging: No results found.  Labs: BMET  Recent Labs Lab 12/13/15 1215 12/17/15 0606  NA 138 135  K 3.8 3.6  CL 98* 96*  CO2 28 29  GLUCOSE 97 106*  BUN 20 17  CREATININE 1.84* 1.97*  CALCIUM 10.4* 9.9   CBC  Recent Labs Lab 12/13/15 1215  WBC 7.7  HGB  14.7  HCT 42.7  MCV 94.5  PLT 286    Medications:    . amLODipine  10 mg Oral Daily  . aspirin  325 mg Oral Daily  . atorvastatin  40 mg Oral q1800  . chlorthalidone  50 mg Oral Daily  . ciprofloxacin  250 mg Oral BID  . citalopram  10 mg Oral Daily  . cloNIDine  0.3 mg Transdermal Weekly  . clotrimazole   Topical BID  . folic acid  1 mg Oral Daily  . hydrocerin   Topical TID  . labetalol  200 mg Oral TID  . multivitamin with minerals  1 tablet Oral Daily  . nicotine  14 mg Transdermal Daily  . pantoprazole  40 mg Oral QHS  . potassium chloride  10 mEq Oral Daily  . senna-docusate  2 tablet Oral QHS  . thiamine  100 mg Oral Daily  . traZODone  100 mg Oral QHS      Otelia Santee, MD 12/18/2015, 1:23 PM

## 2015-12-18 NOTE — Progress Notes (Addendum)
Physical Therapy Session Note  Patient Details  Name: Taylor Obrien MRN: [id number] Date of Birth: 08-Mar-1966  Today's Date: 12/18/2015 PT Individual Time: 1103-1208 PT Individual Time Calculation (min): 65 min   Short Term Goals: Week 2:  PT Short Term Goal 1 (Week 2): Pt will consistently perform bed <> chair transfers with min A PT Short Term Goal 1 - Progress (Week 2): Not met PT Short Term Goal 2 (Week 2): Pt will perform gait 20' with +2 assist PT Short Term Goal 2 - Progress (Week 2): Met PT Short Term Goal 3 (Week 2): Pt will demo standing balance for functional task with max A PT Short Term Goal 3 - Progress (Week 2): Met Week 3: =  LTGs due to LOS       Skilled Therapeutic Interventions/Progress Updates:   w/c propulsion using hemi method.  wife here for family ed; she observed and return demonstrated simulated truck transfer, folding w/c, donning L AFO, bumping w/c up/down ramp/curb.  Pt has a side entry down 1 step, in grass.  PT discussed possible need for someone to shovel snow to make a pathway for approach to step, down to apt.  She is not positive who they will get a ride home with on Wed, to d/c home, but they may be in a regular truck. Pt transferred to simulated truck ht seat using partial stand technique.  Gait using hallway railing x 25' with max assist for progression/placement of L foot, L hip and knee ext, upright trunk.  Max multimodal cues throughout.  Pt wearing shoe cover over L shoe, LAFO.  Pt initiates L hip flexion, but is unable to initiate knee extension.  L hip retracted throughout all phases of gait. Gait x 15' pushing grocery cart with bil hands, max/mod assist, with wife assisting in steering cart.  Pt benefits from using his L hand in a safe way.   Wife returned pt to room.    Therapy Documentation Precautions:  Precautions Precautions: Fall Precaution Comments: hypotonic [name] and motor planning difficulties with the [name] Restrictions Weight  Bearing Restrictions: No    See Function Navigator for Current Functional Status.   Therapy/Group: Individual Therapy  Shloimy Michalski 12/18/2015, 12:16 PM

## 2015-12-18 NOTE — Progress Notes (Signed)
Speech Language Pathology Daily Session Note  Patient Details  Name: Taylor Obrien MRN: [id number] Date of Birth: [birth year]  Today's Date: 12/18/2015 SLP Individual Time: 0902-1000 SLP Individual Time Calculation (min): 58 min  Short Term Goals: Week 4: SLP Short Term Goal 1 (Week 4): Pt will demonstrate functional problem solving for semi-complex tasks with min assist verbal cues.  SLP Short Term Goal 2 (Week 4): Pt will utilize external aids to recall daily, functional information with min A verbal and visual cues.  SLP Short Term Goal 3 (Week 4): Patient will demonstrate selective attention in a moderately distracting enviornment for 45-60 minutes with supervision cues for redirection.  SLP Short Term Goal 4 (Week 4): Pt will recognize and correct errors in the moment during basic tasks with min assist verbal cues.   Skilled Therapeutic Interventions:  Pt was seen for skilled ST targeting family education.  Pt's wife was present and actively participatory in training session.  SLP provided skilled education regarding goals and progress in therapies, including rationale for ST services.  SLP also provided skilled instruction for techniques to maximize attention to tasks, memory compensatory strategies, and functional problem solving strategies.  Handouts were provided to facilitate carryover of strategies in the home environment.  SLP also provided a handout of cognitive reorganization activities to increase treatment effects in between therapy sessions at home.  All questions were answered to pt's and family's satisfaction at this time.  Pt was left with wife at bedside and call bell left within reach.  Continue per current plan of care.    Function:  Eating Eating                 Cognition Comprehension Comprehension assist level: Follows basic conversation/direction with extra time/assistive device  Expression   Expression assist level: Expresses complex 90% of the time/cues < 10%  of the time  Social Interaction Social Interaction assist level: Interacts appropriately 90% of the time - Needs monitoring or encouragement for participation or interaction.  Problem Solving Problem solving assist level: Solves basic 50 - 74% of the time/requires cueing 25 - 49% of the time  Memory Memory assist level: Recognizes or recalls 50 - 74% of the time/requires cueing 25 - 49% of the time    Pain Pain Assessment Pain Assessment: No/denies pain   Therapy/Group: Individual Therapy  [name], [name] 12/18/2015, 3:56 PM

## 2015-12-18 NOTE — Progress Notes (Signed)
Occupational Therapy Session Note  Patient Details  Name: Taylor Obrien MRN: [id number] Date of Birth: 09-30-1966  Today's Date: 12/18/2015 OT Individual Time: 0800-0900 OT Individual Time Calculation (min): 60 min    Short Term Goals: Week 3:  OT Short Term Goal 1 (Week 3): Continue working on short term goals downgraded to min/mod assist level for discharge.   Skilled Therapeutic Interventions/Progress Updates:    Pt's wife present for session for family education.  Had them work on sit to stand transitions, squat pivot toilet transfers, and squat pivot tub/shower transfers.  Pt tends to still move impulsively with transfers and attempts to stand instead of completing squat pivot method.  Pt's  Wife is able to help cue him and complete 90% of setup prior to transfers.  Min instructional cueing to remove arm rests on the side he is transferring to.  Discussed need for hand held shower and suction cup grab bar for safety.    Therapy Documentation Precautions:  Precautions Precautions: Fall Precaution Comments: hypotonic [name] and motor planning difficulties with the [name] Restrictions Weight Bearing Restrictions: No  ADL: See Function Navigator for Current Functional Status.   Therapy/Group: Individual Therapy  Sinia Antosh OTR/L 12/18/2015, 12:19 PM

## 2015-12-19 ENCOUNTER — Ambulatory Visit (HOSPITAL_COMMUNITY): Payer: Medicaid Other | Admitting: *Deleted

## 2015-12-19 ENCOUNTER — Inpatient Hospital Stay (HOSPITAL_COMMUNITY): Payer: Medicaid Other | Admitting: Speech Pathology

## 2015-12-19 ENCOUNTER — Inpatient Hospital Stay (HOSPITAL_COMMUNITY): Payer: Medicaid Other | Admitting: Occupational Therapy

## 2015-12-19 ENCOUNTER — Telehealth: Payer: Self-pay

## 2015-12-19 LAB — BASIC METABOLIC PANEL
ANION GAP: 7 (ref 5–15)
BUN: 26 mg/dL — ABNORMAL HIGH (ref 6–20)
CO2: 30 mmol/L (ref 22–32)
Calcium: 9.4 mg/dL (ref 8.9–10.3)
Chloride: 99 mmol/L — ABNORMAL LOW (ref 101–111)
Creatinine, Ser: 1.99 mg/dL — ABNORMAL HIGH (ref 0.61–1.24)
GFR calc Af Amer: 44 mL/min — ABNORMAL LOW (ref >60)
GFR, EST NON AFRICAN AMERICAN: 38 mL/min — AB (ref >60)
GLUCOSE: 112 mg/dL — AB (ref 65–99)
POTASSIUM: 3.3 mmol/L — AB (ref 3.5–5.1)
Sodium: 136 mmol/L (ref 135–145)

## 2015-12-19 MED ORDER — POTASSIUM CHLORIDE CRYS ER 20 MEQ PO TBCR
20.0000 meq | EXTENDED_RELEASE_TABLET | Freq: Once | ORAL | Status: AC
  Administered 2015-12-19: 20 meq via ORAL
  Filled 2015-12-19: qty 1

## 2015-12-19 MED ORDER — POTASSIUM CHLORIDE CRYS ER 20 MEQ PO TBCR
20.0000 meq | EXTENDED_RELEASE_TABLET | Freq: Every day | ORAL | Status: DC
  Administered 2015-12-20: 20 meq via ORAL
  Filled 2015-12-19 (×2): qty 1

## 2015-12-19 NOTE — Plan of Care (Signed)
Problem: RH Balance Goal: LTG: Patient will maintain dynamic sitting balance (OT) LTG: Patient will maintain dynamic sitting balance with assistance during activities of daily living (OT)  Outcome: Not Met (add Reason) Needs min assist for dynamic sitting balance.

## 2015-12-19 NOTE — Progress Notes (Signed)
Subjective/Complaints: No pain c/os  Slept ok, no new issues     ROS: + Dysuria. Denies CP,SOB, N/V/D   Objective: Vital Signs: Blood pressure 135/81, pulse 61, temperature 97.6 F (36.4 C), temperature source Oral, resp. rate 16, weight 87.181 kg (192 lb 3.2 oz), SpO2 98 %. No results found. Results for orders placed or performed during the hospital encounter of 11/23/15 (from the past 72 hour(s))  Basic metabolic panel     Status: Abnormal   Collection Time: 12/17/15  6:06 AM  Result Value Ref Range   Sodium 135 135 - 145 mmol/L   Potassium 3.6 3.5 - 5.1 mmol/L   Chloride 96 (L) 101 - 111 mmol/L   CO2 29 22 - 32 mmol/L   Glucose, Bld 106 (H) 65 - 99 mg/dL   BUN 17 6 - 20 mg/dL   Creatinine, Ser 1.97 (H) 0.61 - 1.24 mg/dL   Calcium 9.9 8.9 - 10.3 mg/dL   GFR calc non Af Amer 39 (L) >60 mL/min   GFR calc Af Amer 45 (L) >60 mL/min    Comment: (NOTE) The eGFR has been calculated using the CKD EPI equation. This calculation has not been validated in all clinical situations. eGFR's persistently <60 mL/min signify possible Chronic Kidney Disease.    Anion gap 10 5 - 15  Urinalysis, Routine w reflex microscopic (not at [facility])     Status: Abnormal   Collection Time: 12/18/15  6:45 PM  Result Value Ref Range   Color, Urine YELLOW YELLOW   APPearance HAZY (A) CLEAR   Specific Gravity, Urine >1.030 (H) 1.005 - 1.030    Comment: REPEATED TO VERIFY   pH 5.5 5.0 - 8.0   Glucose, UA NEGATIVE NEGATIVE mg/dL   Hgb urine dipstick MODERATE (A) NEGATIVE   Bilirubin Urine NEGATIVE NEGATIVE   Ketones, ur 15 (A) NEGATIVE mg/dL   Protein, ur 100 (A) NEGATIVE mg/dL   Nitrite NEGATIVE NEGATIVE   Leukocytes, UA NEGATIVE NEGATIVE  Urine microscopic-add on     Status: Abnormal   Collection Time: 12/18/15  6:45 PM  Result Value Ref Range   Squamous Epithelial / LPF 0-5 (A) NONE SEEN   WBC, UA 0-5 0 - 5 WBC/hpf   RBC / HPF 6-30 0 - 5 RBC/hpf   Bacteria, UA RARE (A) NONE SEEN   Casts HYALINE  CASTS (A) NEGATIVE   Urine-Other SPERM PRESENT   Basic metabolic panel     Status: Abnormal   Collection Time: 12/19/15  5:34 AM  Result Value Ref Range   Sodium 136 135 - 145 mmol/L   Potassium 3.3 (L) 3.5 - 5.1 mmol/L   Chloride 99 (L) 101 - 111 mmol/L   CO2 30 22 - 32 mmol/L   Glucose, Bld 112 (H) 65 - 99 mg/dL   BUN 26 (H) 6 - 20 mg/dL   Creatinine, Ser 1.99 (H) 0.61 - 1.24 mg/dL   Calcium 9.4 8.9 - 10.3 mg/dL   GFR calc non Af Amer 38 (L) >60 mL/min   GFR calc Af Amer 44 (L) >60 mL/min    Comment: (NOTE) The eGFR has been calculated using the CKD EPI equation. This calculation has not been validated in all clinical situations. eGFR's persistently <60 mL/min signify possible Chronic Kidney Disease.    Anion gap 7 5 - 15    BP 135/81 mmHg  Pulse 61  Temp(Src) 97.6 F (36.4 C) (Oral)  Resp 16  Wt 87.181 kg (192 lb 3.2 oz)  SpO2  98% Gen NAD. Vital signs reviewed  HEENT:  Normocephalic, atraumatic Cardio: RRR and no murmur Resp: CTA B/L and unlabored GI: BS positive and NT, ND Musc/Skel:  No tenderness.  No Edema Neuro: Alert and Oriented Sensation Absent to light touch left upper and left lower extremity Motor: [name]: Shoulder abduction 4+/5, elbow flexion/extension 4+/5, finger grip 4 +/5 [name]: 0/5 RUE/RLE: Antigravity strength Skin:   Intact. Warm and dry.  Assessment/Plan: 1. Functional deficits secondary to Right ACA infarct with Left hemiparesis  which require 3+ hours per day of interdisciplinary therapy in a comprehensive inpatient rehab setting. Physiatrist is providing close team supervision and 24 hour management of active medical problems listed below. Physiatrist and rehab team continue to assess barriers to discharge/monitor patient progress toward functional and medical goals. FIM: Function - Bathing Bathing activity did not occur: Refused Position: Shower Body parts bathed by patient: Left arm, Right arm, Chest, Abdomen, Front perineal area, Right upper  leg, Right lower leg, Left lower leg, Left upper leg Body parts bathed by helper: Buttocks Bathing not applicable: Back Assist Level: Touching or steadying assistance(Pt > 75%)  Function- Upper Body Dressing/Undressing Upper body dressing/undressing activity did not occur: Refused What is the patient wearing?: Pull over shirt/dress Pull over shirt/dress - Perfomed by patient: Thread/unthread right sleeve, Thread/unthread left sleeve, Put head through opening, Pull shirt over trunk Pull over shirt/dress - Perfomed by helper: Pull shirt over trunk Assist Level: Set up, Supervision or verbal cues Set up : To obtain clothing/put away Function - Lower Body Dressing/Undressing Lower body dressing/undressing activity did not occur: Refused What is the patient wearing?: Shoes Position: Wheelchair/chair at sink Underwear - Performed by patient: Thread/unthread right underwear leg, Pull underwear up/down Underwear - Performed by helper: Thread/unthread left underwear leg Pants- Performed by patient: Thread/unthread right pants leg, Thread/unthread left pants leg, Pull pants up/down Pants- Performed by helper: Pull pants up/down Non-skid slipper socks- Performed by patient: Don/doff right sock Non-skid slipper socks- Performed by helper: Don/doff left sock Socks - Performed by patient: Don/doff right sock, Don/doff left sock Socks - Performed by helper: Don/doff left sock Shoes - Performed by patient: Fasten right, Fasten left, Don/doff left shoe Shoes - Performed by helper: Don/doff right shoe Assist for footwear: Supervision/touching assist Assist for lower body dressing: Touching or steadying assistance (Pt > 75%)  Function - Toileting Toileting activity did not occur: No continent bowel/bladder event Toileting steps completed by patient: Performs perineal hygiene, Adjust clothing prior to toileting, Adjust clothing after toileting Toileting steps completed by helper: Adjust clothing prior to  toileting, Performs perineal hygiene, Adjust clothing after toileting Toileting Assistive Devices: Grab bar or rail Assist level: Touching or steadying assistance (Pt.75%)  Function - Air cabin crew transfer activity did not occur: N/A Toilet transfer assistive device: Mechanical lift Mechanical lift: Stedy Assist level to toilet: Touching or steadying assistance (Pt > 75%) Assist level from toilet: Touching or steadying assistance (Pt > 75%)  Function - Chair/bed transfer Chair/bed transfer method: Squat pivot Chair/bed transfer assist level: Moderate assist (Pt 50 - 74%/lift or lower) Chair/bed transfer assistive device: Armrests, Orthosis Chair/bed transfer details: Verbal cues for technique, Visual cues for safe use of DME/AE, Verbal cues for precautions/safety, Verbal cues for sequencing  Function - Locomotion: Wheelchair Will patient use wheelchair at discharge?: Yes Type: Manual Max wheelchair distance: 150 Assist Level: Supervision or verbal cues Assist Level: Supervision or verbal cues Assist Level: Supervision or verbal cues Turns around,maneuvers to table,bed, and toilet,negotiates 3% grade,maneuvers on  rugs and over doorsills: No Function - Locomotion: Ambulation Assistive device: Rail in hallway, Orthosis Max distance: 25 Assist level:  (wc follow) Walk 10 feet activity did not occur: Safety/medical concerns Assist level: Maximal assist (Pt 25 - 49%) Walk 50 feet with 2 turns activity did not occur: Safety/medical concerns Walk 150 feet activity did not occur: Safety/medical concerns Walk 10 feet on uneven surfaces activity did not occur: Safety/medical concerns  Function - Comprehension Comprehension: Auditory Comprehension assist level: Follows basic conversation/direction with extra time/assistive device  Function - Expression Expression: Verbal Expression assist level: Expresses complex 90% of the time/cues < 10% of the time  Function - Social  Interaction Social Interaction assist level: Interacts appropriately 90% of the time - Needs monitoring or encouragement for participation or interaction.  Function - Problem Solving Problem solving assist level: Solves basic 50 - 74% of the time/requires cueing 25 - 49% of the time  Function - Memory Memory assist level: Recognizes or recalls 50 - 74% of the time/requires cueing 25 - 49% of the time Patient normally able to recall (first 3 days only): Current season, Location of own room, Staff names and faces, That he or she is in a hospital  Medical Problem List and Plan: 1.  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  DVT Prophylaxis/Anticoagulation: Mechanical: Sequential compression devices, below knee Bilateral lower extremities 3. Pain Management:  hydrocodone prn for pain, ad Kpad for low back, MSK CP resolved 4. Mood: LCSW to follow for evaluation and support.      5. Neuropsych: This patient is not fully capable of making decisions on his own behalf. 6. Skin/Wound Care: Routine pressure relief measures. Maintain adequate nutrition and hydration status.   7. Fluids/Electrolytes/Nutrition: Monitor I/O. Appetite remains good.   Hypokalemia:  boderline low at 3.3 increase po supplementation,renal also following 8.  HTN:  Norvasc, normodyne , chlorthalidone, Catapres TTS at max doses- NO ACE or diuretic given recent AKI  Catapres prn additionally for SBP > 180 or DBP >105 as per Neurology parameters Nephro consulted, appreciate recs  135/81 this am- no med adjustment   9. Dyslipidemia: On Lipitor.   10. H/o depression with anxiety: Used to see mental health in the past. Has been off meds for years.   Started SSRI- Celexa on 12/27- appreciate Neuropsych follow up  low dose xanax d/ced 11. AKI/CKD: GFR  worsening likely due to  ATN, pressure related per Renal who will f/u today-should not hold up d/c in am 12. OSA  CPAP at noc-  13. 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

## 2015-12-19 NOTE — Progress Notes (Signed)
Social Work Patient ID: Taylor Obrien, male   DOB: Sep 08, 1966, 50 y.o.   MRN: [id number] Met with pt and wife to [employer] questions pt had regarding transportation services-[facility] and medical appointments. Aware sleep study appointment cancelled here in [place]. Pam-PA working on scheduling one in [place] area closer to pt's home. Have scheduled medical appointment for Jan 16 @ 9:00 am in the clinic where he has been followed. Equipment has been delivered to his room so ready for home. Wife feels comfortable with the care pt requires and is ready for him to come home. Will also complete [facility] paperwork and fax in  For services prior to CAP services. Both aware and will wait a phone call from them.

## 2015-12-19 NOTE — Progress Notes (Signed)
Speech Language Pathology Discharge Summary  Patient Details  Name: Taylor Obrien MRN: [id number] Date of Birth: [birth year]  Today's Date: 12/19/2015 SLP Individual Time: 0800-0900 SLP Individual Time Calculation (min): 60 min   Skilled Therapeutic Interventions:  Pt was seen for skilled ST targeting cognitive goals.  SLP administered the [facility] standardized cognitive assessment to measure progress from initial evaluation.  Pt scored a 23/30 on evaluation (n>/= 26) with deficits most notable for executive function and sentence recall.  Pt in agreement with recommendation for continued ST follow up to continue to address cognitive function.  SLP also facilitated the session with a previously targeted categorical naming task targeting functional problem solving.  Pt was able to selectively attend to task for >30 minutes with multiple environmental distractions with self directed redirection to task.  Pt required intermittent min assist verbal cues to recall previously named items but was otherwise able to name category members with supervision.  All education is complete at this time.  All pt's and family's questions were answered to their satisfaction.  Pt was left in wheelchair with wife at bedside.      Patient has met 4 of 6 long term goals.  Patient to discharge at [facility] level.  Reasons goals not met:  Pt still with deficits in intellectual/emergent awareness and functional problem solving requiring min-mod assist   Clinical Impression/Discharge Summary:  Pt made functional gains while inpatient and is discharging having met 4 out of 6 long term goals.  Pt is currently an overall min assist for basic to semi-complex functional tasks due to decreased intellectual/emergent awareness of deficits, impaired recall of new information, and decreased functional problem solving.  Pt and family education is complete at this time.  Pt is discharging home with 24/7 supervision from wife.  Pt would benefit  from additional ST follow up at next level of care to address cognitive function.    Care Partner:  Caregiver Able to Provide Assistance: Yes  Type of Caregiver Assistance: Physical;Cognitive  Recommendation:  Home Health SLP;Outpatient SLP;24 hour supervision/assistance  Rationale for SLP Follow Up: Reduce caregiver burden;Maximize cognitive function and independence   Equipment: none recommended by SLP    Reasons for discharge: Discharged from hospital   Patient/Family Agrees with Progress Made and Goals Achieved: Yes   Function:  Eating Eating                 Cognition Comprehension Comprehension assist level: Follows basic conversation/direction with extra time/assistive device  Expression   Expression assist level: Expresses basic needs/ideas: With extra time/assistive device  Social Interaction Social Interaction assist level: Interacts appropriately 75 - 89% of the time - Needs redirection for appropriate language or to initiate interaction.  Problem Solving Problem solving assist level: Solves basic 75 - 89% of the time/requires cueing 10 - 24% of the time  Memory Memory assist level: Recognizes or recalls 75 - 89% of the time/requires cueing 10 - 24% of the time   Windell Moulding L 12/19/2015, 11:11 AM

## 2015-12-19 NOTE — Discharge Instructions (Signed)
Inpatient Rehab Discharge Instructions   [facility] Discharge date and time:    Activities/Precautions/ Functional Status: Activity: activity as tolerated Diet: cardiac diet 2 gram Wound Care: none needed   Functional status:  ___ No restrictions     ___ Walk up steps independently ___ 24/7 supervision/assistance   ___ Walk up steps with assistance ___ Intermittent supervision/assistance  ___ Bathe/dress independently ___ Walk with walker     ___ Bathe/dress with assistance ___ Walk Independently    ___ Shower independently ___ Walk with assistance    ___ Shower with assistance ___ No alcohol     ___ Return to work/school ________  Special Instructions:     COMMUNITY REFERRALS UPON DISCHARGE:    Home Health:   PT,OT,RN    [place]   Date of last service:12/20/2015  Medical Equipment/Items Ordered:WHEELCHAIR, DROP-ARM [place]   [place] LIST-FL2 SENT COUNSELING: [place] m-f 8:00-5:00 PM TRANSPORTATION: [facility]  [phone]  GENERAL COMMUNITY RESOURCES FOR PATIENT/FAMILY: Support Groups:[facility] SUPPORT GROUP  My questions have been answered and I understand these instructions. I will adhere to these goals and the provided educational materials after my discharge from the hospital.  Patient/Caregiver Signature _______________________________ Date __________  Clinician Signature _______________________________________ Date __________  Please bring this form and your medication list with you to all your follow-up doctor's appointments.

## 2015-12-19 NOTE — Progress Notes (Signed)
Occupational Therapy Session Note  Patient Details  Name: Taylor Obrien MRN: [id number] Date of Birth: [birth year]  Today's Date: 12/19/2015 OT Individual Time: 1100-1130 OT Individual Time Calculation (min): 30 min    Short Term Goals: Week 1:  OT Short Term Goal 1 (Week 1): Pt will complete upper body dressing sitting at EOB with mod assist OT Short Term Goal 1 - Progress (Week 1): Met OT Short Term Goal 2 (Week 1): Pt will transfer from w/c to drop-arm commode with max assist OT Short Term Goal 2 - Progress (Week 1): Met OT Short Term Goal 3 (Week 1): Pt will complete 3 of 5 grooming tasks with min cues to sequence OT Short Term Goal 3 - Progress (Week 1): Met OT Short Term Goal 4 (Week 1): Pt will demo improved attention to left as evidenced by ability to locate supplies needed for BADL with min vc OT Short Term Goal 4 - Progress (Week 1): Met OT Short Term Goal 5 (Week 1): Pt will bathe seated at sink with mod assist to maintain supported standing while washing buttocks. OT Short Term Goal 5 - Progress (Week 1): Met Week 2:  OT Short Term Goal 1 (Week 2): STGs=LTGs secondary to estimated LOS Week 3:  OT Short Term Goal 1 (Week 3): Continue working on short term goals downgraded to min/mod assist level for discharge.   Skilled Therapeutic Interventions/Progress Updates:    Pt seen for skilled OT with a focus on family education with patient and his wife with drop arm BSC transfers.  Therapist demonstrated with pt 2x set up, positioning, and technique to complete squat  Pivot transfer to wide drop arm BSC. The [facility] that they will take home had arrived so pt was able to transfer to that one and seat set at correct height.  Pt's wife was able to return demonstrate the transfer practicing with her husband.  Also practiced pt using R hand on arm rest to give him more support when standing as she assists him with clothing management. Discussed having a 3rd family member present the first few  times they do this transfer at home.  They plan to use BSC over the toilet at home.    Therapy Documentation Precautions:  Precautions Precautions: Fall Precaution Comments: hypotonic [name] and motor planning difficulties with the [name] Restrictions Weight Bearing Restrictions: No    Vital Signs: Therapy Vitals Temp: 97.6 F (36.4 C) Temp Source: Oral Pulse Rate: 61 Resp: 16 BP: 135/81 mmHg Patient Position (if appropriate): Lying Oxygen Therapy SpO2: 98 % O2 Device: Not Delivered Pain:   ADL: ADL ADL Comments: see Functional Tool  See Function Navigator for Current Functional Status.   Therapy/Group: Individual Therapy  SAGUIER,JULIA 12/19/2015, 8:29 AM

## 2015-12-19 NOTE — Progress Notes (Signed)
Occupational Therapy Discharge Summary  Patient Details  Name: Taylor Obrien MRN: [id number] Date of Birth: 1966/09/22  Today's Date: 12/19/2015 OT Individual Time: 0901-1000 OT Individual Time Calculation (min): 59 min   Session Note:  Pt's wife assisted with ADL session including shower, grooming, bathing tasks.  She was able to help with transfer squat pivot to the tub bench for bathing and then back to the chair.  Educated her on not just helping the pt with bathing tasks but having him complete all aspects including turning on and off the water and soaping up his washcloth.  Discussed having him stand with min assist while using both UEs on the grab bar and for her just to complete peri hygiene secondary to his increased likely hood of falling in the shower if he lets go of the grab bar with his right hand to perform tasks.  He was able to complete dressing tasks at the sink with mod assist for LB and supervision for UB.  He continues to need cueing to position the [name], flex trunk forward, and move slowly through the position.    Patient has met 12 of 15 long term goals due to improved balance, postural control, ability to compensate for deficits, functional use of  LEFT upper extremity, improved attention, improved awareness and improved coordination.  Patient to discharge at [facility] Assist level.  [name] care partner is independent to provide the necessary physical and cognitive assistance at discharge.    Reasons goals not met: Pt continues to need min assist for dynamic standing balance during selfcare tasks.  He also needs mod assist for LB dressing tasks  Recommendation:  Patient will benefit from ongoing skilled OT services in home health setting to continue to advance functional skills in the area of BADL.  Pt still demonstrates significant balance deficits as well as motor planning issues and safety awareness issues related to his CVA.  Feel he will need 24 hour assist for safety  and ongoing [place].  He needs constant verbal cueing for the setting up and completion of all selfcare tasks and functional transfers.  Taylor Obrien continues to move impulsivity with lots of extensor pattern movements during all transitional movements as well.  Feel he has potential to reach a supervision level or better with increased OT services.  His wife has been educated and is able to return demonstrate safe assist with all selfcare tasks at this time.   Equipment: tub bench, drop arm commode  Reasons for discharge: treatment goals met and discharge from hospital  Patient/family agrees with progress made and goals achieved: Yes  OT Discharge Precautions/Restrictions  Precautions Precautions: Fall Precaution Comments: hypotonic [name] and motor planning difficulties with the [name]  Pain Pain Assessment Pain Assessment: 0-10 Pain Score: 8  Pain Type: Acute pain Pain Location: Back Pain Intervention(s): RN made aware ADL ADL ADL Comments: see Functional Tool Vision/Perception  Vision- History Baseline Vision/History: No visual deficits Wears Glasses: Reading only Patient Visual Report: No change from baseline Vision- Assessment Vision Assessment?: Yes Eye Alignment: Within Functional Limits Ocular Range of Motion: Within Functional Limits Alignment/Gaze Preference: Within Defined Limits Tracking/Visual Pursuits: Able to track stimulus in all quads without difficulty Saccades: Within functional limits Convergence: Within functional limits Visual Fields: No apparent deficits Praxis Praxis-Other Comments: 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  Cognition Overall Cognitive Status: Impaired/Different from baseline Arousal/Alertness: Awake/alert Orientation Level: Oriented X4 Attention: Selective Sustained Attention: Appears intact Sustained Attention  Impairment: Verbal basic;Functional basic Selective Attention: Appears intact Memory Impairment:  Decreased short term memory Awareness: Impaired Awareness Impairment: Emergent impairment Problem Solving: Impaired Problem Solving Impairment: Functional basic Reasoning: Impaired Safety/Judgment: Impaired Comments: Pt with decreased emergent and anticipatory awareness with regards to functional transfers and sit to stand.  Pt needs step by step cueing for positioning of the [name] and for scooting forward to the EOC and performing transitonal movmements in a slow and controlled fashion.   Sensation Sensation Light Touch: Impaired Detail Light Touch Impaired Details: Impaired [name] Stereognosis: Appears Intact (Pt able to correctly identify 3/3 objects placed in the palm of the hand. ) Hot/Cold: Appears Intact Proprioception: Impaired Detail (Decreased proprioception noted in the wrist and digits) Proprioception Impaired Details: Impaired [name] Coordination Gross Motor Movements are Fluid and Coordinated: No Fine Motor Movements are Fluid and Coordinated: No Coordination and Movement Description: Pt uses the [name] functionally for all tasks but demonstrates decreased gross and FM coordination.  Decreased ability to actively let go of objects at times and will also repeatedly reach for objects, such as the wheelchair brake, when not really trying to do so.   Finger Nose Finger Test: much slower movment with finger to nose in the [name] compared to the right.  Motor  Motor Motor: Hemiplegia;Motor apraxia Motor - Discharge Observations: Pt still with dense hemiparesis in the [name] with some knee flexor tone as well.  Decreased coordination and use of the [name] as well as decreased motor planning. Mobility  Transfers Transfers: Sit to Stand;Stand to Sit Sit to Stand: 4: Min assist;From toilet;With armrests;With upper extremity assist Sit to Stand Details: Verbal cues for technique;Manual facilitation for weight shifting;Verbal cues for sequencing Stand to Sit: 4: Min assist;With upper extremity assist;To  toilet Stand to Sit Details (indicate cue type and reason): Manual facilitation for weight shifting;Verbal cues for safe use of DME/AE;Manual facilitation for placement  Trunk/Postural Assessment  Cervical Assessment Cervical Assessment: Within Functional Limits Thoracic Assessment Thoracic Assessment: Within Functional Limits Lumbar Assessment Lumbar Assessment: Exceptions to [facility] (pt maintains posterior pelvic tilt) Postural Control Trunk Control: Pt still with occasional LOB posteriorly and to the right in sitting during unsupported dressing tasks.  Balance Static Sitting Balance Static Sitting - Level of Assistance: 5: Stand by assistance Dynamic Sitting Balance Dynamic Sitting - Level of Assistance: 4: Min assist Sitting balance - Comments: Pt with LOB to the right in sitting unsupported when donning LB clothing.  Static Standing Balance Static Standing - Level of Assistance: 4: Min assist Dynamic Standing Balance Dynamic Standing - Level of Assistance: 3: Mod assist Extremity/Trunk Assessment RUE Assessment RUE Assessment: Within Functional Limits [name] Assessment [name] Assessment: Exceptions to [facility] (Pt with AROM WFLS for all joints and gross strength 4/5 throughout.  Pt with limited gross and FM coordination secondary to motor planning deficits and "Alien arm syndrome".  Decreased ability to tie shoes and peforming in-hand manipulation. )   See Function Navigator for Current Functional Status.  Muzamil Harker OTR/L 12/19/2015, 1:36 PM

## 2015-12-19 NOTE — Plan of Care (Signed)
Problem: RH Problem Solving Goal: LTG Patient will demonstrate problem solving for (SLP) LTG: Patient will demonstrate problem solving for basic/complex daily situations with cues (SLP)  Outcome: Not Met (add Reason) Pt can complete basic tasks with min assist but needs mod assist for complex tasks   Problem: RH Awareness Goal: LTG: Patient will demonstrate intellectual/emergent (SLP) LTG: Patient will demonstrate intellectual/emergent/anticipatory awareness with assist during a cognitive/linguistic activity (SLP)  Outcome: Not Met (add Reason) Min assist needed for emergent awareness of deficits, supervision for intellectual awareness

## 2015-12-19 NOTE — Progress Notes (Signed)
Social Work  Discharge Note  The overall goal for the admission was met for:   Discharge location: [place]  Length of Stay: Yes-27 DAYS  Discharge activity level: Yes-MIN/MOD WHEELCHAIR LEVEL  Home/community participation: Yes  Services provided included: MD, RD, PT, OT, SLP, RN, CM, TR, Pharmacy, Neuropsych and SW  Financial Services: Medicaid  Follow-up services arranged: Home Health: [facility]-PT,OT,SP,RN, DME: [facility] and Patient/Family has no preference for HH/DME agencies  Comments (or additional information):WIFE HERE FOR TWO DAYS AND LEARNED PT'S CARE, BOTH COMFORTABLE WITH HIS CARE AND READY TO [place]. DISABILITY APPLICATION PENDING [facility]-COUNSELING INFORMATION GIVEN TO BOTH TO FOLLOW UP. PCS REFERRAL MADE AND PLACED ON CAP WAITING LIST IN [place] [facility] INFORMATION GIVEN FOR ASSIST WITH  TRANSPORTATION. FEELS NO NEED FOR SUBSTANCE ABUSE SERVICES WILL GET THROUGH [facility]  Patient/Family verbalized understanding of follow-up arrangements: Yes  Individual responsible for coordination of the follow-up plan: PATIENT & CHIQUITA-WIFE  Confirmed correct DME delivered: Elease Hashimoto 12/19/2015    Elease Hashimoto

## 2015-12-19 NOTE — Progress Notes (Signed)
Physical Therapy Note  Patient Details  Name: Taylor Obrien MRN: [id number] Date of Birth: Jul 30, 1966 Today's Date: 12/19/2015    Time: 1300-1355 55 minutes  1:1 No c/o pain.  Co-tx with rec therapist for education with pt/wife and discussion of home safety, energy conservation, problem solving ideas for home.  Pt propelled w/c in home environment with supervision, 2 x 150' with supervision in controlled environment.  Gait at railing in hallway with max A x 30' with manual facilitation for wt shift and L LE advancement.  Stair negotiation with bilat handrails with +2 assist for safety with manual facilitation for L LE advancement and wt shifts, cues for safety on stairs.  Nu step per pt request for LE strengthening x 6 minutes level 2.  Both pt and wife state that they have no further questions about d/c home and feel safe and ready to d/c home tomorrow.   Kirtan Sada 12/19/2015, 2:43 PM

## 2015-12-19 NOTE — Progress Notes (Signed)
Physical Therapy Discharge Summary  Patient Details  Name: Taylor Obrien MRN: [id number] Date of Birth: 1966-09-18    Patient has met 7 of 8 long term goals due to improved activity tolerance, improved balance, improved postural control, increased strength, ability to compensate for deficits, functional use of  left upper extremity and left lower extremity, improved attention and improved awareness.  Patient to discharge at a wheelchair level [name].   [name] care partner is independent to provide the necessary physical and cognitive assistance at discharge.  Reasons goals not met: pt requires +2 assist for stairs  Recommendation:  Patient will benefit from ongoing skilled PT services in home health setting to continue to advance safe functional mobility, address ongoing impairments in strength, balance, gait, mobility, and minimize fall risk.  Equipment: w/c, AFO  Reasons for discharge: treatment goals met and discharge from hospital  Patient/family agrees with progress made and goals achieved: Yes  PT Discharge Precautions/Restrictions Precautions Precautions: Fall Precaution Comments: hypotonic [name] and motor planning difficulties with the [name] Restrictions Weight Bearing Restrictions: No  Cognition Overall Cognitive Status: Impaired/Different from baseline Arousal/Alertness: Awake/alert Orientation Level: Oriented X4 Attention: Selective Sustained Attention: Appears intact Sustained Attention Impairment: Verbal basic;Functional basic Selective Attention: Appears intact Reasoning: Impaired Safety/Judgment: Impaired Comments: Pt with decreased emergent and anticipatory awareness with regards to functional transfers and sit to stand.  Pt needs step by step cueing for positioning of the [name] and for scooting forward to the EOC and performing transitonal movmements in a slow and controlled fashion.   Sensation Sensation Light Touch: Impaired Detail Light Touch Impaired  Details: Impaired [name];Impaired [name] Proprioception: Impaired Detail (Decreased proprioception noted in the wrist and digits) Proprioception Impaired Details: Impaired [name];Impaired [name] Coordination Gross Motor Movements are Fluid and Coordinated: No Fine Motor Movements are Fluid and Coordinated: No Coordination and Movement Description: hemiplegi  Motor  Motor Motor: Hemiplegia;Motor apraxia Motor - Discharge Observations: Pt still with dense hemiparesis in the [name] with some knee flexor tone as well.  Decreased coordination and use of the [name] as well as decreased motor planning.   Trunk/Postural Assessment  Cervical Assessment Cervical Assessment: Within Functional Limits Thoracic Assessment Thoracic Assessment: Within Functional Limits Lumbar Assessment Lumbar Assessment:  (posterior tilt) Postural Control Trunk Control: pt still with occasional LOB to the Rt and posteriorly  Balance Static Sitting Balance Static Sitting - Level of Assistance: 5: Stand by assistance Dynamic Sitting Balance Dynamic Sitting - Level of Assistance: 4: Min assist Sitting balance - Comments: Pt with LOB to the right in sitting unsupported when donning LB clothing.  Static Standing Balance Static Standing - Level of Assistance: 4: Min assist Dynamic Standing Balance Dynamic Standing - Level of Assistance: 2: Max assist Extremity Assessment  RUE Assessment RUE Assessment: Within Functional Limits [name] Assessment [name] Assessment: Exceptions to [facility] (Pt with AROM WFLS for all joints and gross strength 4/5 throughout.  Pt with limited gross and FM coordination secondary to motor planning deficits and "Alien arm syndrome".  Decreased ability to tie shoes and peforming in-hand manipulation. ) RLE Assessment RLE Assessment: Within Functional Limits [name] Strength [name] Overall Strength Comments: trace hamstring and glute strength, increased tone in adductors, still 0/5 ankle PF/DF and knee ext   See Function  Navigator for Current Functional Status.  Taylor Obrien 12/19/2015, 1:57 PM

## 2015-12-19 NOTE — Progress Notes (Signed)
Social Work Patient ID: Taylor Obrien, male   DOB: Oct 05, 1966, 50 y.o.   MRN: [id number] Contacted Neuro office to cancel pt's appointment tomorrow, told he needs to re-schedule when able. Met with pt and wife to inform them of this.

## 2015-12-19 NOTE — Progress Notes (Signed)
  [facility] Progress Note    Assessment/ Plan:   Admitted 11/23/2015 with CVA and poorly controlled HTN, had been started on Norvasc 10mg  daily and chlorthalidone + clonidine patch and labetolol 200mg  TID w/ intermittent drops in BP leading to ischemic ATN. Now reconsulted bec of bump in creatinine. I don't see any obvious offenders.   1. HTN- is on amlodipine 10 daily- admin at 8 AM -chlorthalidone 50 daily at 10 AM- clonidine #3 patch weekly 0.3mg  also labetalol 200 TID - had dips that were too low for him and gave him some ATN-seems to have stabilized out some. - BP seems to be consistently high in the morning which suggests that he may not have a nocturnal dip in BP. - I just changed chlorthalidone to 50mg  at 5pm to help stabilize the BP. 2. Renal- creatinine bumped up to 1.9 this hosp; likely hemodynamic. Only offending agent would be cipro but would not stop that as of yet.  - Will first recheck a U/A. Previous U/A with only 100 of protein and granular casts and minimal RBC/WBC. SPEP neg.  - Variation in Cr likely due to variability in BP --> will follow chemistry panels to see if it settles - Renal function ~ same as yesterday. If stable then can check every other day. He's feeling great.   Subjective:   No complaints. Denies f/c/n/v. Good appetite. No dizziness or diplopia.   Objective:   BP 135/81 mmHg  Pulse 61  Temp(Src) 97.6 F (36.4 C) (Oral)  Resp 16  Wt 87.181 kg (192 lb 3.2 oz)  SpO2 98%  Intake/Output Summary (Last 24 hours) at 12/19/15 1344 Last data filed at 12/19/15 1300  Gross per 24 hour  Intake    720 ml  Output    425 ml  Net    295 ml   Weight change:   Physical Exam: General: NAD  Heart: RRR Lungs: clear Abdomen: soft, non tender Extremities: no edema  Imaging: No results found.  Labs: BMET  Recent Labs Lab 12/13/15 1215 12/17/15 0606 12/19/15 0534  NA 138 135 136  K 3.8 3.6 3.3*  CL 98* 96* 99*  CO2 28 29 30    GLUCOSE 97 106* 112*  BUN 20 17 26*  CREATININE 1.84* 1.97* 1.99*  CALCIUM 10.4* 9.9 9.4   CBC  Recent Labs Lab 12/13/15 1215  WBC 7.7  HGB 14.7  HCT 42.7  MCV 94.5  PLT 286    Medications:    . amLODipine  10 mg Oral Daily  . aspirin  325 mg Oral Daily  . atorvastatin  40 mg Oral q1800  . chlorthalidone  50 mg Oral Daily  . ciprofloxacin  250 mg Oral BID  . citalopram  10 mg Oral Daily  . cloNIDine  0.3 mg Transdermal Weekly  . clotrimazole   Topical BID  . folic acid  1 mg Oral Daily  . hydrocerin   Topical TID  . labetalol  200 mg Oral TID  . multivitamin with minerals  1 tablet Oral Daily  . nicotine  14 mg Transdermal Daily  . pantoprazole  40 mg Oral QHS  . [START ON 12/20/2015] potassium chloride  20 mEq Oral Daily  . potassium chloride  20 mEq Oral Once  . senna-docusate  2 tablet Oral QHS  . thiamine  100 mg Oral Daily  . traZODone  100 mg Oral QHS      Taylor Santee, MD 12/19/2015, 1:44 PM

## 2015-12-19 NOTE — Plan of Care (Signed)
Problem: RH Dressing Goal: LTG Patient will perform lower body dressing w/assist (OT) LTG: Patient will perform lower body dressing with assist, with/without cues in positioning using equipment (OT)  Outcome: Not Met (add Reason) Currently needs mod assist for dressing.  Problem: RH Awareness Goal: LTG: Patient will demonstrate intellectual/emergent (OT) LTG: Patient will demonstrate intellectual/emergent/anticipatory awareness with assist during a functional activity (OT)  Outcome: Not Met (add Reason) Still needs cueing for emergent and anticipatory awareness.

## 2015-12-19 NOTE — Telephone Encounter (Signed)
LM to confirm that patient can come to appt tomorrow (pt may be in rehab facility currently). I asked him to call back if he cannot make appt.

## 2015-12-20 ENCOUNTER — Institutional Professional Consult (permissible substitution): Payer: Medicaid Other | Admitting: Neurology

## 2015-12-20 LAB — BASIC METABOLIC PANEL
ANION GAP: 11 (ref 5–15)
BUN: 22 mg/dL — ABNORMAL HIGH (ref 6–20)
CALCIUM: 9.8 mg/dL (ref 8.9–10.3)
CO2: 28 mmol/L (ref 22–32)
Chloride: 99 mmol/L — ABNORMAL LOW (ref 101–111)
Creatinine, Ser: 1.82 mg/dL — ABNORMAL HIGH (ref 0.61–1.24)
GFR, EST AFRICAN AMERICAN: 49 mL/min — AB (ref >60)
GFR, EST NON AFRICAN AMERICAN: 43 mL/min — AB (ref >60)
Glucose, Bld: 110 mg/dL — ABNORMAL HIGH (ref 65–99)
Potassium: 3.2 mmol/L — ABNORMAL LOW (ref 3.5–5.1)
Sodium: 138 mmol/L (ref 135–145)

## 2015-12-20 MED ORDER — CHLORTHALIDONE 50 MG PO TABS: 50.0000 mg | ORAL_TABLET | Freq: Every day | ORAL | Status: DC

## 2015-12-20 MED ORDER — CIPROFLOXACIN HCL 250 MG PO TABS: 250.0000 mg | ORAL_TABLET | Freq: Two times a day (BID) | ORAL | Status: DC

## 2015-12-20 MED ORDER — ACETAMINOPHEN 500 MG PO TABS: 1000.0000 mg | ORAL_TABLET | Freq: Three times a day (TID) | ORAL | Status: DC | PRN

## 2015-12-20 MED ORDER — NICOTINE 14 MG/24HR TD PT24: 14.0000 mg | MEDICATED_PATCH | Freq: Every day | TRANSDERMAL | Status: DC

## 2015-12-20 MED ORDER — SALINE SPRAY 0.65 % NA SOLN: 1.0000 | NASAL | Status: DC | PRN

## 2015-12-20 MED ORDER — AMLODIPINE BESYLATE 10 MG PO TABS: 10.0000 mg | ORAL_TABLET | Freq: Every day | ORAL | Status: DC

## 2015-12-20 MED ORDER — TRAZODONE HCL 100 MG PO TABS: 100.0000 mg | ORAL_TABLET | Freq: Every day | ORAL | Status: DC

## 2015-12-20 MED ORDER — LABETALOL HCL 200 MG PO TABS: 200.0000 mg | ORAL_TABLET | Freq: Three times a day (TID) | ORAL | Status: DC

## 2015-12-20 MED ORDER — FOLIC ACID 1 MG PO TABS: 1.0000 mg | ORAL_TABLET | Freq: Every day | ORAL | Status: DC

## 2015-12-20 MED ORDER — PANTOPRAZOLE SODIUM 40 MG PO TBEC: 40.0000 mg | DELAYED_RELEASE_TABLET | Freq: Every day | ORAL | Status: DC

## 2015-12-20 MED ORDER — HYDROCODONE-ACETAMINOPHEN 7.5-325 MG PO TABS: 1.0000 | ORAL_TABLET | Freq: Three times a day (TID) | ORAL | Status: DC | PRN

## 2015-12-20 MED ORDER — ATORVASTATIN CALCIUM 40 MG PO TABS: 40.0000 mg | ORAL_TABLET | Freq: Every day | ORAL | Status: DC

## 2015-12-20 MED ORDER — SENNOSIDES-DOCUSATE SODIUM 8.6-50 MG PO TABS: 2.0000 | ORAL_TABLET | Freq: Every day | ORAL | Status: DC

## 2015-12-20 MED ORDER — POTASSIUM CHLORIDE CRYS ER 20 MEQ PO TBCR
20.0000 meq | EXTENDED_RELEASE_TABLET | Freq: Once | ORAL | Status: AC
  Administered 2015-12-20: 20 meq via ORAL

## 2015-12-20 MED ORDER — HYDROCERIN EX CREA: 1.0000 "application " | TOPICAL_CREAM | Freq: Three times a day (TID) | CUTANEOUS | Status: DC

## 2015-12-20 MED ORDER — ADULT MULTIVITAMIN W/MINERALS CH: 1.0000 | ORAL_TABLET | Freq: Every day | ORAL | Status: DC

## 2015-12-20 MED ORDER — POTASSIUM CHLORIDE CRYS ER 20 MEQ PO TBCR: 20.0000 meq | EXTENDED_RELEASE_TABLET | Freq: Two times a day (BID) | ORAL | Status: DC

## 2015-12-20 MED ORDER — CLOTRIMAZOLE 1 % EX CREA: TOPICAL_CREAM | Freq: Two times a day (BID) | CUTANEOUS | Status: DC

## 2015-12-20 MED ORDER — ASPIRIN 325 MG PO TABS: 325.0000 mg | ORAL_TABLET | Freq: Every day | ORAL | Status: DC

## 2015-12-20 MED ORDER — CITALOPRAM HYDROBROMIDE 10 MG PO TABS: 10.0000 mg | ORAL_TABLET | Freq: Every day | ORAL | Status: DC

## 2015-12-20 MED ORDER — CLONIDINE HCL 0.3 MG/24HR TD PTWK: 0.3000 mg | MEDICATED_PATCH | TRANSDERMAL | Status: DC

## 2015-12-20 MED ORDER — ALPRAZOLAM 0.25 MG PO TABS: 0.2500 mg | ORAL_TABLET | Freq: Two times a day (BID) | ORAL | Status: DC | PRN

## 2015-12-20 NOTE — Discharge Summary (Signed)
Physician Discharge Summary  Patient ID: Taylor Obrien MRN: [id number] DOB/AGE: 1966/09/05 50 y.o.  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  Labs:  Basic Metabolic Panel:  Recent Labs Lab 12/17/15 0606 12/19/15 0534 12/20/15 0630  NA 135 136 138  K 3.6 3.3* 3.2*  CL 96* 99* 99*  CO2 29 30 28   GLUCOSE 106* 112* 110*  BUN 17 26* 22*  CREATININE 1.97* 1.99* 1.82*  CALCIUM 9.9 9.4 9.8    CBC: CBC Latest Ref Rng 12/13/2015 11/18/2015 11/18/2015  WBC 4.0 - 10.5 K/uL 7.7 - 7.4  Hemoglobin 13.0 - 17.0 g/dL 14.7 18.4(H) 17.4(H)  Hematocrit 39.0 - 52.0 % 42.7 54.0(H) 48.7  Platelets 150 - 400 K/uL 286 - 172     CBG: No results for input(s): GLUCAP in the last 168 hours.  Brief HPI:   Taylor Obrien is a 50 y.o. male with history of HTN, depression, chronic pain who was admitted via [facility] on 11/18/15 with acute onset of left sided weakness. CTA showed M2 occlusion. He was treated with TPA and underwent cerebral angio showing occluded R-ACA proximal segment with partial distal leptomeningeal collateralization and R-MCA without filling defects. 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 UDS positive for THC. Neurology feels that patient with  embolic stroke of unknown etiology and 30 day monitor recommended by cardiology as TEE showed moderate LVH with EF 55-60% but no thrombus, ASD or PFO.  Patient with  resultant right sided weakness, left inattention, poor awareness of deficits, poor safety with strong pusher tendencies. Therapy ongoing and working on pre-gait activity a well as [name] stability. CIR mmended for further therapies.    Hospital Course: Taylor Obrien was admitted to rehab 11/23/2015 for inpatient therapies to consist of PT, ST and OT at least three hours five days a week. Past admission physiatrist, therapy team and rehab RN have worked together to provide customized collaborative inpatient rehab. Blood pressures were monitored on tid basis but continued to be difficult to control and patient developed worsening of renal status with rise in creatinine to 1.8.  renal artery duplex was negative for stenosis and Dr. Mercy Moore with nephrology was consulted for input.  UA showed evidence of protein and SPEP was negative.  Medications were adjusted to avoid hypotension as this felt to causing  ATN affecting renal function and medications were adjusted to prevent this. His creatinine has stabilized at  1.8 to1.9 range.  Lytes have been monitored and has had persistent hypokalemia therefore K dur was increased to bid at discharge. He had recurrent episode of hematuria but urine culture was negative for infection. Hematuria likely due to recurrent prostatitis and hee was started 3 week antibiotic course of treatment. He was treated with septra briefly but this was changed to cipro due to worsening of Cr.  Headaches have resolved but he has required use of vicodin for management of chronic right shoulder pain. He has been using this twice a day and this was refilled due to pending follow up appointment with Dr. Luna Glasgow.   Po intake has been good and he's continent of bowel and bladder. Mood and outlook has improved with addition of Celexa to  help with mood disruption. Dr. Vikki Ports psychologist has been following for support and patient has been instructed to follow up with [facility] for counseling after discharge.  Trazodone was added to help manage insomnia and low dose xanax has been used as needed to manage uncontrolled left arm movements. He has been attempting to use CPAP during his hospital stay and preferred to have sleep study set up in his home town.  His primary care was contacted and is to refer him for evaluation in [place]. Patient has been educated on importance of mediation adherence, THC cessation and well as compliance with MD office visits.  He has made steady progress and was fitted with L-AFO to help foot drop and knee stability.  He continues to be limited dense left hemiparesis with sensory deficits and motor apraxia. He is currently at min assist at wheelchair level. He will continue to receive follow up [place], [place], [place] and [place] by [place]. He has also been given information on [facility] and placed on CAP waiting list.    Rehab course: During patient's stay in rehab weekly team conferences were held to monitor patient's progress, set goals and discuss barriers to discharge. At admission, patient had min to moderate cognitive deficits with impulsive behaviors as well as left facial weakness with pocketing. He required max assist with self-care tasks and +2 total assist with mobility. He has had improvement in activity tolerance, balance, postural control, as well as ability to compensate for deficits. He is has had improvement in functional use [name]  And [name] as well as improved awareness. He is able to complete bathing and LB dressing with min assist. He requires min assist for transfers and min to moderate assistance with standing balance. He is able to complete basic to semi-complex tasks with min assist and requires min assist for recall, problem solving and safety/awareness. Family education was done with wife  regarding all aspects of care as well as safety precautions.    Disposition: Home  Diet: Heart Healthy. 2 gram sodium.   Special Instructions: 1. No driving. No strenuous activity. 2. Needs 24 hours supervision/assistance.  3. Needs to have GU referral rescheduled for work up of hematuria.  4. Needs to have sleep study set up.  5. Needs to follow up with [facility] in [place] for 30 day event monitor.    Discharge Instructions    Ambulatory referral to Physical Medicine Rehab    Complete by:  As directed   Needs follow up post stroke appointment in 4 weeks            Medication List    STOP taking these medications        cloNIDine 0.3 MG tablet  Commonly known as:  CATAPRES  Replaced by:  cloNIDine 0.3 mg/24hr patch     hydrochlorothiazide 25 MG tablet  Commonly known as:  HYDRODIURIL      TAKE these medications        acetaminophen 500 MG tablet  Commonly known as:  TYLENOL  Take 2 tablets (1,000 mg total) by mouth every 8 (eight) hours as needed.  ALPRAZolam 0.25 MG tablet  Commonly known as:  XANAX  Take 1 tablet (0.25 mg total) by mouth 2 (two) times daily as needed for anxiety (uncontrolled left arm movements).     amLODipine 10 MG tablet  Commonly known as:  NORVASC  Take 1 tablet (10 mg total) by mouth daily.     aspirin 325 MG tablet  Take 1 tablet (325 mg total) by mouth daily.     atorvastatin 40 MG tablet  Commonly known as:  LIPITOR  Take 1 tablet (40 mg total) by mouth daily at 6 PM.     chlorthalidone 50 MG tablet  Commonly known as:  HYGROTON  Take 1 tablet (50 mg total) by mouth daily.     ciprofloxacin 250 MG tablet  Commonly known as:  CIPRO  Take 1 tablet (250 mg total) by mouth 2 (two) times daily.     citalopram 10 MG tablet  Commonly known as:  CELEXA  Take 1 tablet (10 mg total) by mouth daily.     cloNIDine 0.3 mg/24hr patch  Commonly known as:  CATAPRES - Dosed in mg/24 hr  Place 1 patch (0.3 mg total) onto  the skin once a week. Next change on monday     clotrimazole 1 % cream  Commonly known as:  LOTRIMIN  Apply topically 2 (two) times daily.     folic acid 1 MG tablet  Commonly known as:  FOLVITE  Take 1 tablet (1 mg total) by mouth daily.     hydrocerin Crea  Apply 1 application topically 3 (three) times daily. Available over the counter     HYDROcodone-acetaminophen 7.5-325 MG tablet--Rx # 60 pills  Commonly known as:  NORCO  Take 1 tablet by mouth every 8 (eight) hours as needed for moderate pain.     labetalol 200 MG tablet  Commonly known as:  NORMODYNE  Take 1 tablet (200 mg total) by mouth 3 (three) times daily.     multivitamin with minerals Tabs tablet  Take 1 tablet by mouth daily.     nicotine 14 mg/24hr patch  Commonly known as:  NICODERM CQ - dosed in mg/24 hours  Place 1 patch (14 mg total) onto the skin daily.     pantoprazole 40 MG tablet  Commonly known as:  PROTONIX  Take 1 tablet (40 mg total) by mouth at bedtime.     potassium chloride SA 20 MEQ tablet  Commonly known as:  K-DUR,KLOR-CON  Take 1 tablet (20 mEq total) by mouth 2 (two) times daily.     senna-docusate 8.6-50 MG tablet  Commonly known as:  Senokot-S  Take 2 tablets by mouth at bedtime.     sodium chloride 0.65 % Soln nasal spray  Commonly known as:  OCEAN  Place 1 spray into both nostrils as needed for congestion.     traZODone 100 MG tablet  Commonly known as:  DESYREL  Take 1 tablet (100 mg total) by mouth at bedtime.       Follow-up Information    Follow up with Charlett Blake, MD.   Specialty:  Physical Medicine and Rehabilitation   Why:  office to call you with follow up appointment   Contact information:   [place] [address] [phone]       Follow up with Antony Contras, MD. Call today.   Specialties:  Neurology, Radiology   Why:  for stroke follow up in 4 weeks.    Contact information:  [address] [place]  [address] [phone]       Follow up with [facility] On 12/25/2015.   Why:  APPT @ 9:00 am for post hospital follow up. Needs referral to sleep clinic and urology for follow up.    Contact information:   [place] [address] [phone]       Signed: Bary Leriche 12/22/2015, 5:52 PM

## 2015-12-20 NOTE — Progress Notes (Signed)
Subjective/Complaints: No new c/os Discussed CPAP, sleep study, only tolerated CPAP for 3 hr last noc  Slept ok, no new issues     ROS: + Dysuria. Denies CP,SOB, N/V/D   Objective: Vital Signs: Blood pressure 146/82, pulse 63, temperature 98.3 F (36.8 C), temperature source Oral, resp. rate 20, weight 87.181 kg (192 lb 3.2 oz), SpO2 96 %. No results found. Results for orders placed or performed during the hospital encounter of 11/23/15 (from the past 72 hour(s))  Urinalysis, Routine w reflex microscopic (not at [facility])     Status: Abnormal   Collection Time: 12/18/15  6:45 PM  Result Value Ref Range   Color, Urine YELLOW YELLOW   APPearance HAZY (A) CLEAR   Specific Gravity, Urine >1.030 (H) 1.005 - 1.030    Comment: REPEATED TO VERIFY   pH 5.5 5.0 - 8.0   Glucose, UA NEGATIVE NEGATIVE mg/dL   Hgb urine dipstick MODERATE (A) NEGATIVE   Bilirubin Urine NEGATIVE NEGATIVE   Ketones, ur 15 (A) NEGATIVE mg/dL   Protein, ur 100 (A) NEGATIVE mg/dL   Nitrite NEGATIVE NEGATIVE   Leukocytes, UA NEGATIVE NEGATIVE  Urine microscopic-add on     Status: Abnormal   Collection Time: 12/18/15  6:45 PM  Result Value Ref Range   Squamous Epithelial / LPF 0-5 (A) NONE SEEN   WBC, UA 0-5 0 - 5 WBC/hpf   RBC / HPF 6-30 0 - 5 RBC/hpf   Bacteria, UA RARE (A) NONE SEEN   Casts HYALINE CASTS (A) NEGATIVE   Urine-Other SPERM PRESENT   Basic metabolic panel     Status: Abnormal   Collection Time: 12/19/15  5:34 AM  Result Value Ref Range   Sodium 136 135 - 145 mmol/L   Potassium 3.3 (L) 3.5 - 5.1 mmol/L   Chloride 99 (L) 101 - 111 mmol/L   CO2 30 22 - 32 mmol/L   Glucose, Bld 112 (H) 65 - 99 mg/dL   BUN 26 (H) 6 - 20 mg/dL   Creatinine, Ser 1.99 (H) 0.61 - 1.24 mg/dL   Calcium 9.4 8.9 - 10.3 mg/dL   GFR calc non Af Amer 38 (L) >60 mL/min   GFR calc Af Amer 44 (L) >60 mL/min    Comment: (NOTE) The eGFR has been calculated using the CKD EPI equation. This calculation has not been validated in all  clinical situations. eGFR's persistently <60 mL/min signify possible Chronic Kidney Disease.    Anion gap 7 5 - 15    BP 146/82 mmHg  Pulse 63  Temp(Src) 98.3 F (36.8 C) (Oral)  Resp 20  Wt 87.181 kg (192 lb 3.2 oz)  SpO2 96% Gen NAD. Vital signs reviewed  HEENT:  Normocephalic, atraumatic Cardio: RRR and no murmur Resp: CTA B/L and unlabored GI: BS positive and NT, ND Musc/Skel:  No tenderness.  No Edema Neuro: Alert and Oriented Sensation Absent to light touch left upper and left lower extremity Motor: [name]: Shoulder abduction 4+/5, elbow flexion/extension 4+/5, finger grip 4 +/5 [name]: 0/5 RUE/RLE: Antigravity strength Skin:   Intact. Warm and dry.  Assessment/Plan: 1. Functional deficits secondary to Right ACA infarct with Left hemiparesis  which require 3+ hours per day of interdisciplinary therapy in a comprehensive inpatient rehab setting. Physiatrist is providing close team supervision and 24 hour management of active medical problems listed below. Physiatrist and rehab team continue to assess barriers to discharge/monitor patient progress toward functional and medical goals. FIM: Function - Bathing Bathing activity did not occur:  Refused Position: Research scientist (life sciences) parts bathed by patient: Left arm, Right arm, Chest, Abdomen, Front perineal area, Right upper leg, Right lower leg, Left lower leg, Left upper leg Body parts bathed by helper: Buttocks, Back Bathing not applicable: Back Assist Level: Touching or steadying assistance(Pt > 75%)  Function- Upper Body Dressing/Undressing Upper body dressing/undressing activity did not occur: Refused What is the patient wearing?: Pull over shirt/dress Pull over shirt/dress - Perfomed by patient: Thread/unthread right sleeve, Thread/unthread left sleeve, Put head through opening, Pull shirt over trunk Pull over shirt/dress - Perfomed by helper: Pull shirt over trunk Assist Level: Set up, Supervision or verbal cues Set up : To  obtain clothing/put away Function - Lower Body Dressing/Undressing Lower body dressing/undressing activity did not occur: Refused What is the patient wearing?: Shoes, Underwear, Pants, Socks, AFO Position: Wheelchair/chair at sink Underwear - Performed by patient: Thread/unthread right underwear leg, Pull underwear up/down, Thread/unthread left underwear leg Underwear - Performed by helper: Thread/unthread right underwear leg, Thread/unthread left underwear leg, Pull underwear up/down Pants- Performed by patient: Thread/unthread right pants leg, Thread/unthread left pants leg, Pull pants up/down Pants- Performed by helper: Pull pants up/down Non-skid slipper socks- Performed by patient: Don/doff right sock, Don/doff left sock Non-skid slipper socks- Performed by helper: Don/doff left sock Socks - Performed by patient: Don/doff right sock, Don/doff left sock Socks - Performed by helper: Don/doff left sock Shoes - Performed by patient: Don/doff right shoe Shoes - Performed by helper: Fasten right, Don/doff left shoe AFO - Performed by helper: Don/doff left AFO Assist for footwear: Supervision/touching assist Assist for lower body dressing: Touching or steadying assistance (Pt > 75%)  Function - Toileting Toileting activity did not occur: No continent bowel/bladder event Toileting steps completed by patient: Performs perineal hygiene, Adjust clothing prior to toileting, Adjust clothing after toileting Toileting steps completed by helper: Adjust clothing prior to toileting, Performs perineal hygiene, Adjust clothing after toileting Toileting Assistive Devices: Grab bar or rail Assist level: Touching or steadying assistance (Pt.75%)  Function - Toilet Transfers Toilet transfer activity did not occur: N/A Toilet transfer assistive device: Mechanical lift Mechanical lift: Stedy Assist level to toilet: Touching or steadying assistance (Pt > 75%) Assist level from toilet: Touching or steadying  assistance (Pt > 75%)  Function - Chair/bed transfer Chair/bed transfer method: Squat pivot Chair/bed transfer assist level: Touching or steadying assistance (Pt > 75%) Chair/bed transfer assistive device: Armrests, Orthosis Chair/bed transfer details: Verbal cues for technique, Visual cues for safe use of DME/AE, Verbal cues for precautions/safety, Verbal cues for sequencing  Function - Locomotion: Wheelchair Will patient use wheelchair at discharge?: Yes Type: Manual Max wheelchair distance: 150 Assist Level: Supervision or verbal cues Assist Level: Supervision or verbal cues Assist Level: Supervision or verbal cues Turns around,maneuvers to table,bed, and toilet,negotiates 3% grade,maneuvers on rugs and over doorsills: No Function - Locomotion: Ambulation Assistive device: Rail in hallway, Orthosis Max distance: 30 Assist level: Maximal assist (Pt 25 - 49%) Walk 10 feet activity did not occur: Safety/medical concerns Assist level: Maximal assist (Pt 25 - 49%) Walk 50 feet with 2 turns activity did not occur: Safety/medical concerns Walk 150 feet activity did not occur: Safety/medical concerns Walk 10 feet on uneven surfaces activity did not occur: Safety/medical concerns  Function - Comprehension Comprehension: Auditory Comprehension assist level: Follows basic conversation/direction with extra time/assistive device  Function - Expression Expression: Verbal Expression assist level: Expresses complex 90% of the time/cues < 10% of the time  Function - Social Interaction Social Interaction assist  level: Interacts appropriately 90% of the time - Needs monitoring or encouragement for participation or interaction.  Function - Problem Solving Problem solving assist level: Solves basic 50 - 74% of the time/requires cueing 25 - 49% of the time  Function - Memory Memory assist level: Recognizes or recalls 50 - 74% of the time/requires cueing 25 - 49% of the time Patient normally  able to recall (first 3 days only): Current season, Location of own room, Staff names and faces, That he or she is in a hospital  Medical Problem List and Plan: 1.  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  DVT Prophylaxis/Anticoagulation: Mechanical: Sequential compression devices, below knee Bilateral lower extremities 3. Pain Management:  hydrocodone prn for pain, ad Kpad for low back, MSK CP resolved 4. Mood: LCSW to follow for evaluation and support.      5. Neuropsych: This patient is not fully capable of making decisions on his own behalf. 6. Skin/Wound Care: Routine pressure relief measures. Maintain adequate nutrition and hydration status.   7. Fluids/Electrolytes/Nutrition: Monitor I/O. Appetite remains good.   Hypokalemia:  boderline low at 3.3 increase po supplementation,renal also following 8.  HTN:  Norvasc, normodyne , chlorthalidone, Catapres TTS at max doses- NO ACE or diuretic given recent AKI  F/u Renal and PCP 9. Dyslipidemia: On Lipitor.   10. H/o depression with anxiety: Used to see mental health in the past. Has been off meds for years.   Started SSRI- Celexa on 12/27- appreciate Neuropsych follow up  low dose xanax d/ced 11. AKI/CKD: ATN, renal f/u as oupt, will check BMET prior to D/C if still going up ask renal to re eval prior to D/C 12. OSA  CPAP at noc-Sleep study as outpt  13. 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

## 2015-12-20 NOTE — Progress Notes (Signed)
Recreational Therapy Discharge Summary Patient Details  Name: JEVON SHELLS MRN: [id number] Date of Birth: [birth year] Today's Date: 12/20/2015  Long term goals set: 1  Long term goals met: 1  Comments on progress toward goals: Pt has made good progress toward goal and is ready for discharge home with wife to provide/coordinate 24 hour supervision/assist.  Pt is discharging home at overall supervision-min assist level for TR task seated.  Pt is anxious to return home and to participate in previously enjoyed activities.  Education during all TR sessions on use of leisure time with potential adaptation/modifications.  Reasons for discharge: discharge from hospital  Patient/family agrees with progress made and goals achieved: Yes  Joshus Rogan 12/20/2015, 10:51 AM

## 2015-12-20 NOTE — Consult Note (Signed)
  NEUROPSYCHOLOGY NOTE - [facility]   MEDICAL NECESSITY:  Mr. Tenny was seen on the [facility] for follow-up owing to the patient's diagnosis of cerebral infarction.   Records indicate that Mr. Danos is a "50 y.o. male with history of HTN, depression, chronic pain who was admitted via [facility] on 11/18/15 with acute onset of left sided weakness.  CTA showed M2 occlusion. He was treated with TPA and underwent cerebral angio showing occluded R-ACA proximal segment with partial distal leptomeningeal collateralization and R-MCA without filling defects. 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 UDS positive for THC.  Neurology feels that patient with embolic stroke of unknown etiology and 30 day monitor recommended by cardiology as TEE showed moderate LVH with EF 55-60% but no thrombus, ASD or PFO.  Patient has had malignant [HTN] requiring IV labetalol and Neurology recommends permissive HTN but gradually normalize in 5-7 days."   During today's visit, Mr. Angst endorsed a much improved mood. He is more optimistic and his affect continues to be brighter. His sleep remains improved with CPAP therapy and trazadone, which is also likely helping with depression and anxiety. He mentioned having to reschedule a sleep study and he asks that his social worker assist with this endeavor. He also never received certain paperwork involving financial assistance (CAPS?). He again would like his social worker to look into this for him. With regard to discharging home, he is excited and eager. He still plans to participate in psychotherapy once he gets acclimated to being home. His wife will support him with the transition. 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  Strategies to help with this were reviewed.    IMPRESSION: Mr. Delage reported no new cognitive difficulties. Mood continues to improve. Recommend continuing trazadone for sleep and depression/anxiety. He will stick with the plan to seek counseling with a behavioral health program in [place]. He asks that his Education officer, museum to assist with this as well as to check on paperwork that has yet to be received (CAPS?). In addition, he wishes to receive help with a sleep study that needs to be rescheduled. At this time, I have no concerns about his transition home. Neuropsychology does not need to follow-up any more throughout this admission.     Rutha Bouchard, Psy.D.  Clinical Neuropsychologist

## 2015-12-20 NOTE — Progress Notes (Signed)
Recreational Therapy Session Note  Patient Details  Name: Taylor Obrien MRN: [id number] Date of Birth: 1966/11/21 Today's Date: 12/20/2015 LATE ENTRY from 12/19/15 Pain: no c/o Skilled Therapeutic Interventions/Progress Updates: Session focused on discharge planning with pt & wife during co-treat with PT.  Pt propelled w/c to and from therapy gym with supervision. Discussed developing a plan with specific leisure activities and responsibilities for pt at discharge to reduce boredom, risk of depression & potential for substance abuse. Both stated agreement & understanding & had no further questions or concerns. Therapy/Group: Co-Treatment   Taylor Obrien 12/20/2015, 10:46 AM

## 2015-12-20 NOTE — Progress Notes (Signed)
Patient A/O, no noted distress. Patient tolerated meds well. Patient slept throughout the night. Spouse stated she administered all topical creams. Staff will continue to monitor and meet needs.

## 2015-12-21 NOTE — Progress Notes (Signed)
Pt and wife verbaliized understanding of D/C instructions given by [facility] Pam Love. VSS. Pt. requested PRN Norco prior to DC. PAC aware.

## 2016-01-01 DIAGNOSIS — I10 Essential (primary) hypertension: Secondary | ICD-10-CM | POA: Diagnosis not present

## 2016-01-01 DIAGNOSIS — I69354 Hemiplegia and hemiparesis following cerebral infarction affecting left non-dominant side: Secondary | ICD-10-CM | POA: Diagnosis not present

## 2016-01-01 DIAGNOSIS — G8929 Other chronic pain: Secondary | ICD-10-CM | POA: Diagnosis not present

## 2016-01-01 DIAGNOSIS — I69318 Other symptoms and signs involving cognitive functions following cerebral infarction: Secondary | ICD-10-CM | POA: Diagnosis not present

## 2016-01-02 ENCOUNTER — Other Ambulatory Visit: Payer: Self-pay | Admitting: Nurse Practitioner

## 2016-01-02 DIAGNOSIS — I639 Cerebral infarction, unspecified: Secondary | ICD-10-CM

## 2016-01-02 DIAGNOSIS — I4891 Unspecified atrial fibrillation: Secondary | ICD-10-CM

## 2016-01-03 ENCOUNTER — Ambulatory Visit (INDEPENDENT_AMBULATORY_CARE_PROVIDER_SITE_OTHER): Payer: Medicaid Other

## 2016-01-03 DIAGNOSIS — I4891 Unspecified atrial fibrillation: Secondary | ICD-10-CM | POA: Diagnosis not present

## 2016-01-03 DIAGNOSIS — I639 Cerebral infarction, unspecified: Secondary | ICD-10-CM

## 2016-01-05 ENCOUNTER — Encounter: Payer: Self-pay | Admitting: Physical Medicine & Rehabilitation

## 2016-01-18 ENCOUNTER — Institutional Professional Consult (permissible substitution): Payer: Medicaid Other | Admitting: Neurology

## 2016-01-18 ENCOUNTER — Ambulatory Visit: Payer: Self-pay | Admitting: Neurology

## 2016-01-19 ENCOUNTER — Other Ambulatory Visit: Payer: Self-pay | Admitting: Physical Medicine & Rehabilitation

## 2016-01-19 ENCOUNTER — Encounter: Payer: Medicaid Other | Attending: Physical Medicine & Rehabilitation

## 2016-01-19 ENCOUNTER — Ambulatory Visit (HOSPITAL_BASED_OUTPATIENT_CLINIC_OR_DEPARTMENT_OTHER): Payer: Medicaid Other | Admitting: Physical Medicine & Rehabilitation

## 2016-01-19 ENCOUNTER — Encounter: Payer: Self-pay | Admitting: Physical Medicine & Rehabilitation

## 2016-01-19 VITALS — BP 135/85 | HR 74 | Resp 14

## 2016-01-19 DIAGNOSIS — K219 Gastro-esophageal reflux disease without esophagitis: Secondary | ICD-10-CM | POA: Insufficient documentation

## 2016-01-19 DIAGNOSIS — G8929 Other chronic pain: Secondary | ICD-10-CM | POA: Insufficient documentation

## 2016-01-19 DIAGNOSIS — E785 Hyperlipidemia, unspecified: Secondary | ICD-10-CM | POA: Diagnosis not present

## 2016-01-19 DIAGNOSIS — I69319 Unspecified symptoms and signs involving cognitive functions following cerebral infarction: Secondary | ICD-10-CM | POA: Diagnosis not present

## 2016-01-19 DIAGNOSIS — G8114 Spastic hemiplegia affecting left nondominant side: Secondary | ICD-10-CM | POA: Insufficient documentation

## 2016-01-19 DIAGNOSIS — I10 Essential (primary) hypertension: Secondary | ICD-10-CM | POA: Insufficient documentation

## 2016-01-19 DIAGNOSIS — F329 Major depressive disorder, single episode, unspecified: Secondary | ICD-10-CM | POA: Diagnosis not present

## 2016-01-19 MED ORDER — TRAZODONE HCL 100 MG PO TABS: 100.0000 mg | ORAL_TABLET | Freq: Every day | ORAL | Status: DC

## 2016-01-19 MED ORDER — CITALOPRAM HYDROBROMIDE 10 MG PO TABS: 10.0000 mg | ORAL_TABLET | Freq: Every day | ORAL | Status: DC

## 2016-01-19 MED ORDER — NICOTINE 7 MG/24HR TD PT24: 14.0000 mg | MEDICATED_PATCH | Freq: Every day | TRANSDERMAL | Status: DC

## 2016-01-19 MED ORDER — ATORVASTATIN CALCIUM 40 MG PO TABS: 40.0000 mg | ORAL_TABLET | Freq: Every day | ORAL | Status: DC

## 2016-01-19 MED ORDER — LABETALOL HCL 200 MG PO TABS: 200.0000 mg | ORAL_TABLET | Freq: Three times a day (TID) | ORAL | Status: DC

## 2016-01-19 MED ORDER — CHLORTHALIDONE 50 MG PO TABS: 50.0000 mg | ORAL_TABLET | Freq: Every day | ORAL | Status: DC

## 2016-01-19 MED ORDER — POTASSIUM CHLORIDE CRYS ER 20 MEQ PO TBCR: 20.0000 meq | EXTENDED_RELEASE_TABLET | Freq: Two times a day (BID) | ORAL | Status: DC

## 2016-01-19 MED ORDER — CLOTRIMAZOLE 1 % EX CREA: TOPICAL_CREAM | Freq: Two times a day (BID) | CUTANEOUS | Status: DC

## 2016-01-19 MED ORDER — TIZANIDINE HCL 4 MG PO TABS: 4.0000 mg | ORAL_TABLET | Freq: Every day | ORAL | Status: DC

## 2016-01-19 MED ORDER — PANTOPRAZOLE SODIUM 40 MG PO TBEC: 40.0000 mg | DELAYED_RELEASE_TABLET | Freq: Every day | ORAL | Status: DC

## 2016-01-19 MED ORDER — AMLODIPINE BESYLATE 10 MG PO TABS: 10.0000 mg | ORAL_TABLET | Freq: Every day | ORAL | Status: DC

## 2016-01-19 NOTE — Progress Notes (Signed)
Subjective:    Patient ID: Taylor Obrien, male    DOB: 1966/05/02, 50 y.o.   MRN: [id number] 50 y.o. male with history of HTN, depression, chronic pain who was admitted via [facility] on 11/18/15 with acute onset of left sided weakness.  CTA showed M2 occlusion. He was treated with TPA and underwent cerebral angio showing occluded R-ACA proximal segment with partial  distal leptomeningeal collateralization and R-MCA without filling defects. 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 UDS positive for THC.  Neurology feels that patient with embolic stroke of unknown etiology and 30 day monitor recommended by cardiology as TEE showed moderate LVH with EF 55-60% but no thrombus, ASD or PFO Admit date: 11/23/2015 Discharge date: 12/20/2015  HPI Patient has completed inpatient rehabilitation as well as home health. He continues to require help for bathing and dressing and mobility. He lives in [place]. He Will follow up with primary care Next week.  He has run out of all of his medications today Pain Inventory Average Pain 7 Pain Right Now NA My pain is sharp, dull and aching  In the last 24 hours, has pain interfered with the following? General activity 3 Relation with others 1 Enjoyment of life 4 What TIME of day is your pain at its worst? daytime, evening Sleep (in general) Good  Pain is worse with: sitting Pain improves with: rest and medication Relief from Meds: good  Mobility ability to climb steps?  no do you drive?  yes use a wheelchair needs help with transfers  Function not employed: date last employed 11/18/15 I need assistance with the following:  dressing, toileting, meal prep and household duties Do you have any goals in this area?  yes  Neuro/Psych bladder control  problems weakness tingling spasms confusion depression anxiety  Prior Studies Any changes since last visit?  no CT/MRI  Physicians involved in your care Any changes since last visit?  no Primary care .   Family History  Problem Relation Age of Onset  . Diabetes Mother   . Hypertension Mother   . Drug abuse Mother   . Hyperlipidemia Mother   . Diabetes Father   . Hypertension Father   . Hyperlipidemia Father   . Diabetes Brother   . Hypertension Brother    Social History   Social History  . Marital Status: Married    Spouse Name: N/A  . Number of Children: N/A  . Years of Education: N/A   Social History Main Topics  . Smoking status: Current Every Day Smoker -- 0.50 packs/day for 30 years    Types: Cigarettes  . Smokeless tobacco: Never Used  . Alcohol Use: 0.0 oz/week     Comment: daily-per patiet  . Drug Use: Yes    Special: Marijuana  . Sexual Activity: Yes    Birth Control/ Protection: None   Other Topics Concern  . None   Social History Narrative   Past Surgical History  Procedure Laterality Date  . Right rotator cuff repair    . Closed reduction mandibular fracture w/ arch bars      + multiple extractions  . Removal foreign body right shoulder    . Condyloma resection    . Radiology with anesthesia N/A 11/18/2015    Procedure: RADIOLOGY WITH ANESTHESIA;  Surgeon: Luanne Bras, MD;  Location: [facility];  Service: Radiology;  Laterality:  N/A;  Darden Dates without cardioversion N/A 11/21/2015    Procedure: TRANSESOPHAGEAL ECHOCARDIOGRAM (TEE);  Surgeon: Larey Dresser, MD;  Location: [facility];  Service: Cardiovascular;  Laterality: N/A;   Past Medical History  Diagnosis Date  . Essential hypertension, benign   . Hyperlipidemia   . Depression   . Chronic pain   . GERD (gastroesophageal reflux disease)   . HTN (hypertension) 11/27/2015   BP 135/85 mmHg  Pulse 74  Resp 14  SpO2 98%  Opioid Risk Score:   Fall Risk Score:  `1  Depression  screen PHQ 2/9  Depression screen PHQ 2/9 01/19/2016  Decreased Interest 0  Down, Depressed, Hopeless 0  PHQ - 2 Score 0     Review of Systems  Constitutional:       Bowel control problems  Neurological: Positive for weakness.       Tingling spasms  Psychiatric/Behavioral: Positive for confusion and dysphoric mood. The patient is nervous/anxious.   All other systems reviewed and are negative.      Objective:   Physical Exam  Constitutional: He is oriented to person, place, and time. He appears well-developed and well-nourished.  HENT:  Head: Normocephalic and atraumatic.  Eyes: Conjunctivae are normal. Pupils are equal, round, and reactive to light.  Cardiovascular: Normal rate, regular rhythm and normal heart sounds.   Pulmonary/Chest: Effort normal and breath sounds normal.  Abdominal: Soft. Bowel sounds are normal.  Neurological: He is alert and oriented to person, place, and time. No sensory deficit.  Reflex Scores:      Patellar reflexes are 2+ on the right side and 3+ on the left side. Trace left hip knee extensor synergy 0 at the ankle 0 hip flexor Left upper 4/5 in the deltoid, biceps, triceps, grip 5/5 in the right side deltoid bicep tricep grip hip flexor and extensor ankle dorsiflexor Sensation equal to light touch bilateral upper and lower limbs  Psychiatric: He has a normal mood and affect.  Nursing note and vitals reviewed.         Assessment & Plan:  1. Right ACA infarct with left hemiparesis affecting the lower extremity Greater than the Upper extremity Recommend outpatient PT OT and speech I have written orders for this to be performed in [place]  Physical medicine follow-up in 4-6 weeks 2. Multiple stroke risk factors including hypertension and hyperlipidemia. Refilled all of his medications from the hospital today. I instructed both he and his wife that further refills Would need to come from his primary care physician. I reviewed all his  medications when he is taking when he is not taking. I will prescriptions for each of them one-month supply.

## 2016-01-19 NOTE — Patient Instructions (Addendum)
[facility] Ob[facility] outpatient rehabilitation will call you to set up appointments for outpatient PT OT and speech therapy  The spasm medicine is called tizanidine also called Zanaflex  Further prescriptions will come from your primary doctor

## 2016-01-22 NOTE — Telephone Encounter (Signed)
We received this electronic request for refills.  Patient was seen by Dr. Letta Pate on 01/19/2016...Marland KitchenMarland KitchenMarland Kitchenplease advise

## 2016-01-26 ENCOUNTER — Ambulatory Visit (HOSPITAL_COMMUNITY): Payer: Medicaid Other | Attending: Physical Medicine & Rehabilitation | Admitting: Occupational Therapy

## 2016-01-26 ENCOUNTER — Encounter (HOSPITAL_COMMUNITY): Payer: Self-pay | Admitting: Occupational Therapy

## 2016-01-26 DIAGNOSIS — IMO0002 Reserved for concepts with insufficient information to code with codable children: Secondary | ICD-10-CM

## 2016-01-26 DIAGNOSIS — I63521 Cerebral infarction due to unspecified occlusion or stenosis of right anterior cerebral artery: Secondary | ICD-10-CM

## 2016-01-26 DIAGNOSIS — R29898 Other symptoms and signs involving the musculoskeletal system: Secondary | ICD-10-CM

## 2016-01-26 DIAGNOSIS — I6980 Unspecified sequelae of other cerebrovascular disease: Secondary | ICD-10-CM | POA: Insufficient documentation

## 2016-01-26 DIAGNOSIS — I69954 Hemiplegia and hemiparesis following unspecified cerebrovascular disease affecting left non-dominant side: Secondary | ICD-10-CM | POA: Diagnosis present

## 2016-01-26 DIAGNOSIS — R279 Unspecified lack of coordination: Secondary | ICD-10-CM | POA: Diagnosis present

## 2016-01-26 DIAGNOSIS — Z658 Other specified problems related to psychosocial circumstances: Secondary | ICD-10-CM | POA: Insufficient documentation

## 2016-01-26 DIAGNOSIS — M6281 Muscle weakness (generalized): Secondary | ICD-10-CM | POA: Diagnosis present

## 2016-01-26 DIAGNOSIS — Z789 Other specified health status: Secondary | ICD-10-CM

## 2016-01-26 NOTE — Patient Instructions (Signed)
 -  Weightbearing exercises: While standing at table or countertop, place weight on left hand/arm while reaching for objects with right arm. Complete reaching 10X with weight on left arm/hand.     -Grasp and release: When left hand spasticity prevents release of an object, use right hand to bend left wrist forward into wrist flexion to relax flexor tone and release object.        Home Exercises Program Theraputty Exercises  Do the following exercises 1-2 times a day using your affected hand.  1. Roll putty into a ball.  2. Make into a pancake.  3. Roll putty into a roll.  4. Pinch along log with first finger and thumb.   5. Make into a ball.  6. Roll it back into a log.   7. Pinch using thumb and side of first finger.  8. Roll into a ball, then flatten into a pancake.  9. Using your fingers, make putty into a mountain.

## 2016-01-26 NOTE — Therapy (Signed)
[facility] [place], [place], [address] Phone: [phone]   Fax:  [phone]  Occupational Therapy Evaluation  Patient Details  Name: Taylor Obrien MRN: [id number] Date of Birth: [birth year] Referring Provider: Dr. Alysia Penna  Encounter Date: 01/26/2016      OT End of Session - 01/26/16 1052    Visit Number 1   Number of Visits 9   Date for OT Re-Evaluation 03/22/16  mini reassessment 02/19/2016   Authorization Type Medicaid   Authorization Time Period Requesting 8 treatment visits 02/07/16-03/22/16   OT Start Time 0932   OT Stop Time 1020   OT Time Calculation (min) 48 min   Activity Tolerance Patient tolerated treatment well   Behavior During Therapy [facility] for tasks assessed/performed      Past Medical History  Diagnosis Date  . Essential hypertension, benign   . Hyperlipidemia   . Depression   . Chronic pain   . GERD (gastroesophageal reflux disease)   . HTN (hypertension) 11/27/2015    Past Surgical History  Procedure Laterality Date  . Right rotator cuff repair    . Closed reduction mandibular fracture w/ arch bars      + multiple extractions  . Removal foreign body right shoulder    . Condyloma resection    . Radiology with anesthesia N/A 11/18/2015    Procedure: RADIOLOGY WITH ANESTHESIA;  Surgeon: Luanne Bras, MD;  Location: [facility];  Service: Radiology;  Laterality: N/A;  . Tee without cardioversion N/A 11/21/2015    Procedure: TRANSESOPHAGEAL ECHOCARDIOGRAM (TEE);  Surgeon: Larey Dresser, MD;  Location: [facility];  Service: Cardiovascular;  Laterality: N/A;    There were no vitals filed for this visit.  Visit Diagnosis:  Stroke due to occlusion of right anterior cerebral artery (HCC)  Hemiplegia and hemiparesis following unspecified cerebrovascular disease affecting left non-dominant side (HCC)  Decreased grip strength of left hand  Decreased independence with activities of daily  living  Lack of coordination due to stroke      Subjective Assessment - 01/26/16 1048    Subjective  S: I'm ready to get moving again.    Patient is accompained by: Family member  wife   Pertinent History Pt is a 50 y/o male s/p CVA on 11/20/15 presenting with left spastic hemiplegia. Pt received CIR for 4 weeks and HH services for an additional 4 weeks. Pt is anxious to work on independence in ADL tasks and would like to be able to stand and perform functional mobility tasks independently. Pt was referred for occupational therapy evaluation and treatment by Dr. Alysia Penna.    Patient Stated Goals To be able to be independent again.    Currently in Pain? No/denies           [facility] OT Assessment - 01/26/16 0932    Assessment   Diagnosis left spastic hemiparesis   Referring Provider Dr. Alysia Penna   Onset Date 11/20/15   Prior Therapy CIR, HH services   Precautions   Precautions Fall   Balance Screen   Has the patient fallen in the past 6 months Yes   How many times? 3   Has the patient had a decrease in activity level because of a fear of falling?  No   Is the patient reluctant to leave their home because of a fear of falling?  No   Home  Environment   Family/patient expects to be discharged to: Private residence   Living Arrangements  Spouse/significant other   Available Help at Discharge Family   Bathroom Shower/Tub Tub/Shower unit   How accessible Accessible via walker   Home Equipment Wheelchair - manual;Tub bench;Bedside commode   Lives With Spouse   Prior Function   Level of Independence Independent with basic ADLs   Vocation Part time employment   Vocation Requirements maintenance tasks   Leisure fishing, spending time outdoors, playing drums   ADL   Eating/Feeding Set up   Grooming Set up   Grooming details pt able to complete grooming tasks once wife has necessary items out for him   Upper Body Bathing Supervision/safety   Upper Body bathing details  Per pt and wife report: pt is able to perform upper body bathing with supervision   Lower Body Bathing Minimal assistance   Lower Body Bathing Details Per pt and wife report, pt is able to perform a majority of lower body bathing. Pt wife assists with difficulty to reach areas such as feet & lower legs   Upper Body Dressing Performed;Minimal assistance   Upper Body Dressing Details Min assist required for donning jacket at end of task   Lower Body Dressing Minimal assistance   Lower Body Dressing Details Pt able to dress with mod I at bed level, requires assistance for pulling pants over hips when dressing with sit to stand transfer   Toilet Tranfer Moderate assistance   Toilet Transfer Details (indicate cue type and reason Pt unable to fit wheelchair into bathroom therefore is using BSC for toileting. Pt wife assists with squat-pivot or stand-pivot transfer   Nature conservation officer Minimal assistance   Tub/Shower Transfer Moderate assistance   Tub/Shower Transfer Details (indicate cue type and reason Pt able to slide on tub bench, wife assists to lift left leg into tub.   Tub/Shower Environmental consultant   Transfers/Ambulation Related to ADL's Pt unable to fit wheelchair into bathroom, therefore wife provides max assist with functional mobility into bathroom. Pt using wheelchair at all other times   Mobility   Mobility Status Needs assist   Written Expression   Dominant Hand Right   Vision - History   Baseline Vision Wears glasses only for reading   Cognition   Overall Cognitive Status Within Functional Limits for tasks assessed   Sensation   Light Touch Impaired Detail   Light Touch Impaired Details Impaired [name]   Stereognosis Impaired Detail   Stereognosis Impaired Details Impaired [name]   Hot/Cold Appears Intact   Additional Comments Pt able to identify 2/5 items with  stereognosis testing, unable to describe other items accurately. Pt light touch sensation present but altered. For example: when middle finger touched he reports thumb is touched; reports middle finger on second trial   Coordination   Gross Motor Movements are Fluid and Coordinated No   Fine Motor Movements are Fluid and Coordinated No   Coordination and Movement Description Pt has difficulty with bilateral coordination    9 Hole Peg Test Right;Left   Right 9 Hole Peg Test 27.60"   Left 9 Hole Peg Test 31.10"   ROM / Strength   AROM / PROM / Strength Strength;AROM;PROM   AROM   Overall AROM Comments A/ROM of [name] is WFL   PROM   Overall PROM Comments A/ROM of [name] is WNL   Strength   Strength Assessment Site Shoulder;Elbow;Wrist;Hand   Right/Left Shoulder Left   Left Shoulder  Flexion 4/5   Left Shoulder ABduction 4/5   Left Shoulder Internal Rotation 4/5   Left Shoulder External Rotation 4/5   Right/Left Elbow Left   Left Elbow Flexion 4/5   Left Elbow Extension 4/5   Right/Left Wrist Left   Left Wrist Flexion 4/5   Left Wrist Extension 4/5   Left Wrist Radial Deviation 4/5   Left Wrist Ulnar Deviation 4/5   Right/Left hand Left;Right   Right Hand Gross Grasp Functional   Right Hand Grip (lbs) 122   Right Hand Lateral Pinch 20 lbs   Right Hand 3 Point Pinch 18 lbs   Left Hand Gross Grasp Functional   Left Hand Grip (lbs) 70   Left Hand Lateral Pinch 20 lbs   Left Hand 3 Point Pinch 16 lbs                         OT Education - 01/26/16 1047    Education provided Yes   Education Details Provided weightbearing exercise, theraputty exercises, educated on relaxing flexor tone in left hand during grasp and release tasks   Person(s) Educated Patient   Methods Explanation;Demonstration;Handout   Comprehension Verbalized understanding;Returned demonstration          OT Short Term Goals - 01/26/16 1104    OT SHORT TERM GOAL #1   Title Pt will be educated on  and independent in HEP to continue learned techniques and exercises at home.    Time 4   Period Weeks   Status New   OT SHORT TERM GOAL #2   Title Pt will increase left grip strength by 10# to increase ability to incorporate [name] during daily tasks.    Time 4   Period Weeks   Status New   OT SHORT TERM GOAL #3   Title Pt will improve bilateral hand coordination to increase ability to hold an object/wiring and use a screwdriver successfully.    Time 4   Period Weeks   Status New   OT SHORT TERM GOAL #4   Title Pt will require min assist for LB dressing tasks incorporating sit to stand transfer to donn pants.    Time 4   Period Weeks   OT SHORT TERM GOAL #5   Title Pt will be independent in use of sock aid to donn socks and improve bilateral coordination.    Time 4   Period Weeks   Status New           OT Long Term Goals - 01/26/16 1301    OT LONG TERM GOAL #1   Title Pt will achieve highest level of functioning and independence with basic ADL tasks and leisure activities.    Time 8   Period Weeks   Status New   OT LONG TERM GOAL #2   Title Pt will require supervision during LB dressing tasks while incorporating sit to stand transfer.    Time 8   Period Weeks   Status New   OT LONG TERM GOAL #3   Title Pt will increase bilateral hand coordination to increase ability to complete fishing preparation tasks with mod independence.    Time 8   Period Weeks   Status New   OT LONG TERM GOAL #4   Title Pt will increase [name] strength to 4+/5 to increase ability to use [name] for daily tasks including positioning for dressing, bathing, etc.    Time 8   Period Weeks  Status New               Plan - 01/26/16 1055    Clinical Impression Statement A: Pt is a 50 y/o male s/p CVA on 11/20/15 presenting with spastic left hemiplegia, resulting in difficulty with independence in ADL tasks, decreased [name] strength, decreased grip strength, and difficulty with bilateral coordination  tasks. Pt's wife is currently assisting with ADL tasks at home as needed and pt is working on incorporating the [name] during daily tasks. Provided pt with weightbearing exercises, grasp and release pattern, and theraputty exercises for HEP.     Pt will benefit from skilled therapeutic intervention in order to improve on the following deficits (Retired) Decreased strength;Impaired sensation;Decreased knowledge of use of DME;Impaired tone;Impaired UE functional use;Decreased mobility;Decreased activity tolerance;Increased muscle spasms;Decreased coordination;Impaired flexibility;Decreased endurance   Rehab Potential Good   OT Frequency 2x / week  1x/week    OT Duration 2 weeks  for additional 4 weeks   OT Treatment/Interventions Self-care/ADL training;Energy conservation;DME and/or AE instruction;Passive range of motion;Visual/perceptual remediation/compensation;Patient/family education;Electrical Stimulation;Therapist, nutritional;Therapeutic exercise;Manual Therapy;Therapeutic activities;Splinting   Plan P: Pt will benefit from skilled OT services to increase ability to perform ADL and leisure tasks with greater independence, increase [name] grip strength, increase bilateral coordination, increase overal function of [name]. Treatment plan: A/ROM and strengthening exercises for [name], ADL/self-care training, grip strengthening exercises, bilateral coordination tasks, patient/family education.    OT Home Exercise Plan weightbearing, instructions for grasp and release, theraputty exercises-green   Consulted and Agree with Plan of Care Patient;Family member/caregiver   Family Member Consulted wife        Problem List Patient Active Problem List   Diagnosis Date Noted  . Dysuria   . OSA (obstructive sleep apnea)   . Hypokalemia   . Elevated blood pressure   . Hemiparesis affecting left side as late effect of stroke ([place])   . Epistaxis   . HTN (hypertension) 11/27/2015  . AKI (acute kidney injury)  ([place]) 11/27/2015  . Hemiplegia and hemiparesis following unspecified cerebrovascular disease affecting left non-dominant side ([place]) 11/23/2015  . Gait disturbance, post-stroke 11/23/2015  . Stroke due to occlusion of right anterior cerebral artery ([place]) 11/23/2015  . Cerebrovascular accident (CVA) due to occlusion of right anterior cerebral artery ([place])   . Essential hypertension   . Depression   . Chronic pain syndrome   . ETOH abuse   . Marijuana abuse   . Cerebrovascular accident (CVA) due to thrombosis of right carotid artery ([place])   . Malignant hypertension   . Hyperlipidemia   . CVA (cerebral infarction) 11/18/2015  . Stroke (cerebrum) ([place]) 11/18/2015  . Chest pain 10/09/2012  . GERD (gastroesophageal reflux disease) 10/09/2012  . Chronic back pain 10/09/2012  . Tobacco abuse 10/09/2012  . Hypertensive heart disease 08/31/2012  . Accelerated hypertension 08/31/2012  . Precordial pain 08/31/2012    Guadelupe Sabin, OTR/L  [phone]  01/26/2016, 1:08 PM  [facility] [address] [place], [place], [address] Phone: [phone]   Fax:  [phone]  Name: Taylor Obrien MRN: [id number] Date of Birth: [birth year]

## 2016-02-07 ENCOUNTER — Ambulatory Visit (HOSPITAL_COMMUNITY): Payer: Medicaid Other | Admitting: Speech Pathology

## 2016-02-07 ENCOUNTER — Ambulatory Visit (HOSPITAL_COMMUNITY): Payer: Medicaid Other | Admitting: Occupational Therapy

## 2016-02-09 ENCOUNTER — Encounter (HOSPITAL_COMMUNITY): Payer: Medicaid Other | Admitting: Occupational Therapy

## 2016-02-12 ENCOUNTER — Ambulatory Visit: Payer: Medicaid Other | Admitting: Physical Therapy

## 2016-02-13 ENCOUNTER — Encounter (HOSPITAL_COMMUNITY): Payer: Medicaid Other | Admitting: Occupational Therapy

## 2016-02-15 ENCOUNTER — Encounter (HOSPITAL_COMMUNITY): Payer: Medicaid Other | Admitting: Occupational Therapy

## 2016-02-16 ENCOUNTER — Ambulatory Visit (HOSPITAL_BASED_OUTPATIENT_CLINIC_OR_DEPARTMENT_OTHER): Payer: Medicaid Other | Admitting: Physical Medicine & Rehabilitation

## 2016-02-16 ENCOUNTER — Encounter: Payer: Medicaid Other | Attending: Physical Medicine & Rehabilitation

## 2016-02-16 ENCOUNTER — Encounter: Payer: Self-pay | Admitting: Physical Medicine & Rehabilitation

## 2016-02-16 VITALS — BP 128/62 | HR 72 | Resp 14

## 2016-02-16 DIAGNOSIS — I10 Essential (primary) hypertension: Secondary | ICD-10-CM | POA: Insufficient documentation

## 2016-02-16 DIAGNOSIS — K219 Gastro-esophageal reflux disease without esophagitis: Secondary | ICD-10-CM | POA: Diagnosis not present

## 2016-02-16 DIAGNOSIS — F329 Major depressive disorder, single episode, unspecified: Secondary | ICD-10-CM | POA: Diagnosis not present

## 2016-02-16 DIAGNOSIS — R269 Unspecified abnormalities of gait and mobility: Secondary | ICD-10-CM

## 2016-02-16 DIAGNOSIS — I69954 Hemiplegia and hemiparesis following unspecified cerebrovascular disease affecting left non-dominant side: Secondary | ICD-10-CM | POA: Diagnosis not present

## 2016-02-16 DIAGNOSIS — G8114 Spastic hemiplegia affecting left nondominant side: Secondary | ICD-10-CM | POA: Insufficient documentation

## 2016-02-16 DIAGNOSIS — I69319 Unspecified symptoms and signs involving cognitive functions following cerebral infarction: Secondary | ICD-10-CM | POA: Insufficient documentation

## 2016-02-16 DIAGNOSIS — G8929 Other chronic pain: Secondary | ICD-10-CM | POA: Insufficient documentation

## 2016-02-16 DIAGNOSIS — E785 Hyperlipidemia, unspecified: Secondary | ICD-10-CM | POA: Diagnosis not present

## 2016-02-16 DIAGNOSIS — I69398 Other sequelae of cerebral infarction: Secondary | ICD-10-CM

## 2016-02-16 MED ORDER — TIZANIDINE HCL 4 MG PO TABS: 8.0000 mg | ORAL_TABLET | Freq: Every day | ORAL | Status: DC

## 2016-02-16 NOTE — Progress Notes (Signed)
Subjective:    Patient ID: Taylor Obrien, male    DOB: [birth year], 50 y.o.   MRN: [id number]  HPI 50 y.o. male with history of HTN, depression, chronic pain who was admitted via [facility] on 11/18/15 with acute onset of left sided weakness.  CTA showed M2 occlusion. He was treated with TPA and underwent cerebral angio showing occluded R-ACA proximal segment with partial  distal leptomeningeal collateralization and R-MCA without filling defects. 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 UDS positive for THC.  Neurology feels that patient with embolic stroke of unknown etiology and 30 day monitor recommended by cardiology as TEE showed moderate LVH with EF 55-60% but no thrombus, ASD or PFO Admit date: 11/23/2015 Discharge date: 12/20/2015  OP PT visit 3/16 Gained 10-15 lb No falls Left Thigh and leg pain and spasm , worse at night, On Zanaflex 4 mg daily at bedtime. This is not enough. Seen by PCP changed medications for burning with urination, will see urology  Pain Inventory Average Pain 6 Pain Right Now 6 My pain is sharp, stabbing, tingling and aching  In the last 24 hours, has pain interfered with the following? General activity 4 Relation with others 5 Enjoyment of life 7 What TIME of day is your pain at its worst? evening Sleep (in general) Fair  Pain is worse with: sitting and some activites Pain improves with: rest, heat/ice and medication Relief from Meds: 5  Mobility ability to climb steps?  no do you drive?  no use a wheelchair needs help with transfers Do you have any goals in this area?  yes  Function I need assistance with the following:  toileting and household duties  Neuro/Psych bladder control problems weakness trouble walking spasms confusion depression anxiety  Prior Studies Any changes  since last visit?  no  Physicians involved in your care Any changes since last visit?  no   Family History  Problem Relation Age of Onset  . Diabetes Mother   . Hypertension Mother   . Drug abuse Mother   . Hyperlipidemia Mother   . Diabetes Father   . Hypertension Father   . Hyperlipidemia Father   . Diabetes Brother   . Hypertension Brother    Social History   Social History  . Marital Status: Married    Spouse Name: N/A  . Number of Children: N/A  . Years of Education: N/A   Social History Main Topics  . Smoking status: Current Every Day Smoker -- 0.50 packs/day for 30 years    Types: Cigarettes  . Smokeless tobacco: Never Used  . Alcohol Use: 0.0 oz/week     Comment: daily-per patiet  . Drug Use: Yes    Special: Marijuana  . Sexual Activity: Yes    Birth Control/ Protection: None   Other Topics Concern  . None   Social History Narrative   Past Surgical History  Procedure Laterality Date  . Right rotator cuff repair    . Closed reduction mandibular fracture w/ arch bars      + multiple extractions  . Removal foreign body right shoulder    . Condyloma resection    . Radiology with anesthesia N/A 11/18/2015    Procedure: RADIOLOGY WITH ANESTHESIA;  Surgeon: Luanne Bras, MD;  Location: [facility];  Service: Radiology;  Laterality: N/A;  . Tee without cardioversion N/A 11/21/2015  Procedure: TRANSESOPHAGEAL ECHOCARDIOGRAM (TEE);  Surgeon: Larey Dresser, MD;  Location: [facility] ENDOSCOPY;  Service: Cardiovascular;  Laterality: N/A;   Past Medical History  Diagnosis Date  . Essential hypertension, benign   . Hyperlipidemia   . Depression   . Chronic pain   . GERD (gastroesophageal reflux disease)   . HTN (hypertension) 11/27/2015   BP 128/62 mmHg  Pulse 72  Resp 14  SpO2 96%  Opioid Risk Score:   Fall Risk Score:  `1  Depression screen PHQ 2/9  Depression screen PHQ 2/9 01/19/2016  Decreased Interest 0  Down, Depressed, Hopeless 0  PHQ - 2 Score  0     Review of Systems  Genitourinary: Positive for dysuria.  All other systems reviewed and are negative.      Objective:   Physical Exam  Constitutional: He is oriented to person, place, and time. He appears well-developed and well-nourished.  HENT:  Head: Normocephalic and atraumatic.  Eyes: Conjunctivae and EOM are normal. Pupils are equal, round, and reactive to light.  Neurological: He is alert and oriented to person, place, and time.  Psychiatric: He has a normal mood and affect.  Nursing note and vitals reviewed.    4/5 left deltoid, biceps, triceps, grip 0/5 left hip flexor, knee extensor, 2 minus hip knee extensor synergy, 0 at the ankle dorsi flexor plantar flexor 5/5 right deltoid, biceps, triceps and grip Intact finger to thumb opposition on the left side Posture dysdiadochokinesis rapid alternating supination and pronation left side  Normal tone left upper extremity Left lower extremity has sustained clonus at the ankle.     Assessment & Plan:  1. 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 His tone is interfering with sleep at night. Will increase tizanidine to 8 mg daily at bedtime We'll be starting therapy next week which should help Hamstring exercises, stretching  If above is not helpful consider tibial nerve block with phenol   Return to clinic in 1 month

## 2016-02-16 NOTE — Patient Instructions (Signed)
May need a botox injection or a nerve block if your spasms don't improve   Hamstring Strain With Rehab The hamstring muscle and tendons are vulnerable to muscle or tendon tear (strain). Hamstring tears cause pain and inflammation in the backside of the thigh, where the hamstring muscles are located. The hamstring is comprised of three muscles that are responsible for straightening the hip, bending the knee, and stabilizing the knee. These muscles are important for walking, running, and jumping. Hamstring strain is the most common injury of the thigh. Hamstring strains are classified as grade 1 or 2 strains. Grade 1 strains cause pain, but the tendon is not lengthened. Grade 2 strains include a lengthened ligament due to the ligament being stretched or partially ruptured. With grade 2 strains there is still function, although the function may be decreased.  SYMPTOMS   Pain, tenderness, swelling, warmth, or redness over the hamstring muscles, at the back of the thigh.  Pain that gets worse during and after intense activity.  A "pop" heard in the area, at the time of injury.  Muscle spasm in the hamstring muscles.  Pain or weakness with running, jumping, or bending the knee against resistance.  Crackling sound (crepitation) when the tendon is moved or touched.  Bruising (contusion) in the thigh within 48 hours of injury.  Loss of fullness of the muscle, or area of muscle bulging in the case of a complete rupture. CAUSES  A muscle strain occurs when a force is placed on the muscle or tendon that is greater than it can withstand. Common causes of injury include:  Strain from overuse or sudden increase in the frequency, duration, or intensity of activity.  Single violent blow or force to the back of the knee or the hamstring area of the thigh. RISK INCREASES WITH:  Sports that require quick starts (sprinting, racquetball, tennis).  Sports that require jumping (basketball,  volleyball).  Kicking sports and water skiing.  Contact sports (soccer, football).  Poor strength and flexibility.  Failure to warm up properly before activity.  Previous thigh, knee, or pelvis injury.  Poor exercise technique.  Poor posture. PREVENTION  Maintain physical fitness:  Strength, flexibility, and endurance.  Cardiovascular fitness.  Learn and use proper exercise technique and posture.  Wear proper fitted and padded protective equipment. PROGNOSIS  If treated properly, hamstring strains are usually curable in 2 to 6 weeks. RELATED COMPLICATIONS   Longer healing time, if not properly treated or if not given adequate time to heal.  Chronically inflamed tendon, causing persistent pain with activity that may progress to constant pain.  Recurring symptoms, if activity is resumed too soon.  Vulnerable to repeated injury (in up to 25% of cases). TREATMENT  Treatment first involves the use of ice and medication to help reduce pain and inflammation. It is also important to complete strengthening and stretching exercises, as well as modifying any activities that aggravate the symptoms. These exercises may be completed at home or with a therapist. Your caregiver may recommend the use of crutches to help reduce pain and discomfort, especially is the strain is severe enough to cause limping. If the tendon has pulled away from the bone, then surgery is necessary to reattach it. MEDICATION   If pain medicine is needed, nonsteroidal anti-inflammatory medicines (aspirin and ibuprofen), or other minor pain relievers (acetaminophen), are often advised.  Do not take pain medicine for 7 days before surgery.  Prescription pain relievers may be given if your caregiver thinks they are needed.  Use only as directed and only as much as you need.  Corticosteroid injections may be recommended. However, these injections should only be used for serious cases, as they can only be given a  certain number of times.  Ointments applied to the skin may be beneficial. HEAT AND COLD  Cold treatment (icing) relieves pain and reduces inflammation. Cold treatment should be applied for 10 to 15 minutes every 2 to 3 hours, and immediately after activity that aggravates your symptoms. Use ice packs or an ice massage.  Heat treatment may be used before performing the stretching and strengthening activities prescribed by your caregiver, physical therapist, or athletic trainer. Use a heat pack or a warm water soak. SEEK MEDICAL CARE IF:   Symptoms get worse or do not improve in 2 weeks, despite treatment.  New, unexplained symptoms develop. (Drugs used in treatment may produce side effects.) EXERCISES RANGE OF MOTION (ROM) AND STRETCHING EXERCISES - Hamstring Strain These exercises may help you when beginning to rehabilitate your injury. Your symptoms may go away with or without further involvement from your physician, physical therapist or athletic trainer. While completing these exercises, remember:   Restoring tissue flexibility helps normal motion to return to the joints. This allows healthier, less painful movement and activity.  An effective stretch should be held for at least 30 seconds.  A stretch should never be painful. You should only feel a gentle lengthening or release in the stretched tissue. STRETCH - Hamstrings, Standing  Stand or sit, and extend your right / left leg, placing your foot on a chair or foot stool.  Keep a slight arch in your low back and your hips straight forward.  Lead with your chest, and lean forward at the waist until you feel a gentle stretch in the back of your right / left knee or thigh. (When done correctly, this exercise requires leaning only a small distance.)  Hold this position for __________ seconds. Repeat __________ times. Complete this stretch __________ times per day. STRETCH - Hamstrings, Supine   Lie on your back. Loop a belt or  towel over the ball of your right / left foot.  Straighten your right / left knee and slowly pull on the belt to raise your leg. Do not allow the right / left knee to bend. Keep your opposite leg flat on the floor.  Raise the leg until you feel a gentle stretch behind your right / left knee or thigh. Hold this position for __________ seconds. Repeat __________ times. Complete this stretch __________ times per day.  STRETCH - Hamstrings, Doorway  Lie on your back with your right / left leg extended and resting on the wall, and the opposite leg flat on the ground through the door. Initially, position your bottom farther away from the wall.  Keep your right / left knee straight. If you feel a stretch behind your knee or thigh, hold this position for __________ seconds.  If you do not feel a stretch, scoot your bottom closer to the door and hold __________ seconds. Repeat __________ times. Complete this stretch __________ times per day.  STRETCH - Hamstrings/Adductors, V-Sit   Sit on the floor with your legs extended in a large "V," keeping your knees straight.  With your head and chest upright, bend at your waist reaching for your left foot to stretch your right thigh muscles.  You should feel a stretch in your right inner thigh. Hold for __________ seconds.  Return to the upright position to  relax your leg muscles.  Continuing to keep your chest upright, bend straight forward at your waist to stretch your hamstrings.  You should feel a stretch behind both of your thighs and knees. Hold for __________ seconds.  Return to the upright position to relax your leg muscles.  With your head and chest upright, bend at your waist reaching for your right foot to stretch your left thigh muscles.  You should feel a stretch in your left inner thigh. Hold for __________ seconds.  Return to the upright position to relax your leg muscles. Repeat __________ times. Complete this exercise __________  times per day.  STRENGTHENING EXERCISES - Hamstring Strain These exercises may help you when beginning to rehabilitate your injury. They may resolve your symptoms with or without further involvement from your physician, physical therapist or athletic trainer. While completing these exercises, remember:   Muscles can gain both the endurance and the strength needed for everyday activities through controlled exercises.  Complete these exercises as instructed by your physician, physical therapist or athletic trainer. Increase the resistance and repetitions only as guided.  You may experience muscle soreness or fatigue, but the pain or discomfort you are trying to eliminate should never get worse during these exercises. If this pain does get worse, stop and make certain you are following the directions exactly. If the pain is still present after adjustments, discontinue the exercise until you can discuss the trouble with your clinician. STRENGTH - Hip Extensors, Straight Leg Raises   Lie on your stomach on a firm surface.  Tense the muscles in your buttocks to lift your right / left leg about 4 inches. If you cannot lift your leg this high without arching your back, place a pillow under your hips.  Keep your knee straight. Hold for __________ seconds.  Slowly lower your leg to the starting position and allow it to relax completely before starting the next repetition. Repeat __________ times. Complete this exercise __________ times per day.  STRENGTH - Hamstring, Isometrics   Lie on your back on a firm surface.  Bend your right / left knee approximately __________ degrees.  Dig your heel into the surface, as if you are trying to pull it toward your buttocks. Tighten the muscles in the back of your thighs to "dig" as hard as you can, without increasing any pain.  Hold this position for __________ seconds.  Release the tension gradually and allow your muscles to completely relax for __________  seconds between each exercise. Repeat __________ times. Complete this exercise __________ times per day.  STRENGTH - Hamstring, Curls   Lay on your stomach with your legs extended. (If you lay on a bed, your feet may hang over the edge.)  Tighten the muscles in the back of your thigh to bend your right / left knee up to 90 degrees. Keep your hips flat on the bed or floor.  Hold this position for __________ seconds.  Slowly lower your leg back to the starting position. Repeat __________ times. Complete this exercise __________ times per day.  OPTIONAL ANKLE WEIGHTS: Begin with ____________________, but DO NOT exceed ____________________. Increase in 1 pound/0.5 kilogram increments.   This information is not intended to replace advice given to you by your health care provider. Make sure you discuss any questions you have with your health care provider.   Document Released: 11/25/2005 Document Revised: 12/16/2014 Document Reviewed: 03/09/2009 Elsevier Interactive Patient Education [employer].

## 2016-02-19 ENCOUNTER — Telehealth: Payer: Self-pay | Admitting: Physical Medicine & Rehabilitation

## 2016-02-19 NOTE — Telephone Encounter (Signed)
Patient needs refills on: Celexa; Potassium Chloride; Labetalol; Chlorthalidone and Protonix.

## 2016-02-20 ENCOUNTER — Other Ambulatory Visit: Payer: Self-pay | Admitting: *Deleted

## 2016-02-20 ENCOUNTER — Telehealth: Payer: Self-pay | Admitting: Physical Medicine & Rehabilitation

## 2016-02-20 NOTE — Telephone Encounter (Signed)
Wife is calling wondering why we will not refill his medication. I have left message for her to call back.  Please advise if you are willing to refill

## 2016-02-20 NOTE — Telephone Encounter (Signed)
Request already rejected, Electronic script sent back to pharmacy to be rerouted to patient's PCP.

## 2016-02-20 NOTE — Telephone Encounter (Signed)
Please see my last note. It says that his primary care physician saw him which means they can take over the medications.. My first office note indicated that I would fill his prescriptions for 1 more month only until he gets in with his primary care

## 2016-02-20 NOTE — Telephone Encounter (Signed)
Spoke with wife to let her know that they will need to get his prescriptions filled by his primary care.

## 2016-02-22 ENCOUNTER — Encounter (HOSPITAL_COMMUNITY): Payer: Medicaid Other | Admitting: Occupational Therapy

## 2016-02-22 ENCOUNTER — Ambulatory Visit (HOSPITAL_COMMUNITY): Payer: Medicaid Other | Attending: Physical Medicine & Rehabilitation | Admitting: Physical Therapy

## 2016-02-22 DIAGNOSIS — M6281 Muscle weakness (generalized): Secondary | ICD-10-CM | POA: Diagnosis present

## 2016-02-22 DIAGNOSIS — M25669 Stiffness of unspecified knee, not elsewhere classified: Secondary | ICD-10-CM

## 2016-02-22 DIAGNOSIS — I63521 Cerebral infarction due to unspecified occlusion or stenosis of right anterior cerebral artery: Secondary | ICD-10-CM | POA: Diagnosis present

## 2016-02-22 DIAGNOSIS — R29898 Other symptoms and signs involving the musculoskeletal system: Secondary | ICD-10-CM | POA: Diagnosis present

## 2016-02-22 DIAGNOSIS — I69954 Hemiplegia and hemiparesis following unspecified cerebrovascular disease affecting left non-dominant side: Secondary | ICD-10-CM

## 2016-02-22 DIAGNOSIS — R2681 Unsteadiness on feet: Secondary | ICD-10-CM | POA: Diagnosis present

## 2016-02-22 DIAGNOSIS — R279 Unspecified lack of coordination: Secondary | ICD-10-CM | POA: Insufficient documentation

## 2016-02-22 DIAGNOSIS — I6980 Unspecified sequelae of other cerebrovascular disease: Secondary | ICD-10-CM | POA: Diagnosis present

## 2016-02-22 DIAGNOSIS — Z658 Other specified problems related to psychosocial circumstances: Secondary | ICD-10-CM | POA: Insufficient documentation

## 2016-02-22 DIAGNOSIS — IMO0002 Reserved for concepts with insufficient information to code with codable children: Secondary | ICD-10-CM

## 2016-02-22 DIAGNOSIS — Z7409 Other reduced mobility: Secondary | ICD-10-CM

## 2016-02-22 DIAGNOSIS — R531 Weakness: Secondary | ICD-10-CM

## 2016-02-22 DIAGNOSIS — Z789 Other specified health status: Secondary | ICD-10-CM

## 2016-02-22 NOTE — Therapy (Signed)
[place], [place], [address] Phone: [phone]   Fax:  [phone]  Physical Therapy Evaluation  Patient Details  Name: Taylor Obrien MRN: [id number] Date of Birth: 04/13/1966 Referring Provider: Alysia Penna, MD  Encounter Date: 02/22/2016      PT End of Session - 02/22/16 1043    Visit Number 1   Number of Visits 4   Date for PT Re-Evaluation 03/21/16   Authorization Type Medicaid [place]    Authorization Time Period 02/22/16 to 03/21/16   PT Start Time 0930   PT Stop Time 1045   PT Time Calculation (min) 75 min   Equipment Utilized During Treatment Gait belt   Activity Tolerance Patient tolerated treatment well   Behavior During Therapy [facility] for tasks assessed/performed      Past Medical History  Diagnosis Date  . Essential hypertension, benign   . Hyperlipidemia   . Depression   . Chronic pain   . GERD (gastroesophageal reflux disease)   . HTN (hypertension) 11/27/2015    Past Surgical History  Procedure Laterality Date  . Right rotator cuff repair    . Closed reduction mandibular fracture w/ arch bars      + multiple extractions  . Removal foreign body right shoulder    . Condyloma resection    . Radiology with anesthesia N/A 11/18/2015    Procedure: RADIOLOGY WITH ANESTHESIA;  Surgeon: Luanne Bras, MD;  Location: [place];  Service: Radiology;  Laterality: N/A;  . Tee without cardioversion N/A 11/21/2015    Procedure: TRANSESOPHAGEAL ECHOCARDIOGRAM (TEE);  Surgeon: Larey Dresser, MD;  Location: [place];  Service: Cardiovascular;  Laterality: N/A;    There were no vitals filed for this visit.  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 He made it to the bed for 10-15 minutes then couldn't get out of bed. His L side is primarily affected. He has more difficulty with [name] function than anything. He has has "many" falls since his strok while attempting to transfer from his wheelchair to the toilet. He received home health services, but did not get up and walk much other than what his wife was able to help him with.    Patient is accompained by: Family member  Pt's daughter   Pertinent History HTN, CVA, Depression, GERD, Chronic pain syndrome, R RTC sx.    Limitations Standing;Walking;House hold activities   How long can you stand comfortably? Unable    How long can you walk comfortably? only able to take a few steps with wife assisting with LE advancement   Patient Stated Goals moving and walking better   Currently in Pain? Yes   Pain Score 8    Pain Location Buttocks  Back and buttock   Pain Orientation Mid   Pain Descriptors / Indicators Throbbing   Pain Type Chronic pain   Pain Radiating Towards radiating into BLE  Pain Onset More than a month ago   Pain Frequency Intermittent   Aggravating Factors  Sitting for long periods of time   Pain Relieving Factors getting out of his chair             [facility] PT Assessment - 02/22/16 0001    Assessment   Medical Diagnosis L spastic hemiparesis s/p CVA   Referring Provider Alysia Penna, MD   Next MD Visit unsure   Precautions   Precautions Fall   Balance Screen   Has the patient fallen in the past 6 months Yes   How many times? 5   Has the patient had a decrease in activity level because of a fear of falling?  Yes   Is the patient  reluctant to leave their home because of a fear of falling?  No   Home Environment   Living Environment Private residence   Additional Comments Unable to get wheelchair through his bathroom door    Prior Function   Level of Independence Independent  prior to CVA   Tone   Assessment Location Left Lower Extremity   PROM   Overall PROM Comments L hip IR limited, L hip ER WNL   Strength   Right Hip Flexion 3-/5   Right Hip ABduction 4/5  in sitting   Left Hip Flexion 0/5   Left Hip ABduction 1/5  in sitting   Right Knee Flexion 4+/5   Right Knee Extension 5/5   Left Knee Flexion 1/5   Left Knee Extension 1/5   Right Ankle Dorsiflexion 5/5   Left Ankle Dorsiflexion 0/5   Bed Mobility   Bed Mobility Rolling Left;Rolling Right;Scooting to HOB;Sit to Supine;Supine to Sit   Rolling Right 6: Modified independent (Device/Increase time)   Rolling Left 6: Modified independent (Device/Increase time)   Supine to Sit 4: Min assist   Sit to Supine --   Transfers   Transfers Stand Pivot Transfers;Sit to Stand;Stand to Sit   Sit to Stand 4: Min assist   Stand Pivot Transfers 4: Min assist   Ambulation/Gait   Ambulation Distance (Feet) 4 Feet  ModA from PT and pt swinging his hip for LE advancement   Assistive device Rolling walker   Gait Pattern Step-to pattern;Decreased dorsiflexion - left;Decreased hip/knee flexion - left;Decreased stance time - left  increased weight bearing through BUE   Wheelchair Mobility   Wheelchair Mobility --                   [facility] Adult PT Treatment/Exercise - 02/22/16 0001    Exercises   Exercises Lumbar;Knee/Hip   Lumbar Exercises: Seated   Other Seated Lumbar Exercises Seated PWR! sequence   Knee/Hip Exercises: Aerobic   Nustep 8 minutes including transfer from w/c <> seat. L1  Pt symptoms monitored throughout session                PT Education - 02/22/16 1035    Education provided Yes   Education Details Discussed POC,  provided and reviewed initial HEP, discussed getting a RW for home to improve safety and decrease caregiver support   Person(s) Educated Patient   Methods Explanation;Demonstration;Handout   Comprehension Verbalized understanding;Returned demonstration          PT Short Term Goals - 02/22/16 1059    PT SHORT TERM GOAL #1   Title Pt will demonstrate improved w/c safety and mobility evident by his ability to lock/unlock wheels prior to transfer to various surfaces as well  as navigating around obstacles throughout his session without cuing from therapist.   Time 2   Period Weeks   Status New   PT SHORT TERM GOAL #2   Title Pt will be able to demonstrate pressure relief techniques to decrease pain raiting in his low back and buttock region.    Time 2   Period Weeks   Status New   PT SHORT TERM GOAL #3   Title Pt will demonstrate improved bed mobility evident by his ability to move supine to sit each direction modI without cuing from therapist to assist with him getting out of bed in the morning.    Baseline Initial: ModI with verbal cues   Time 2   Period Weeks   Status New   PT SHORT TERM GOAL #4   Title Pt will demonstrate improved strength evident by his ability to perform sit to stand with ModI using RW to improve his transfers.   Time 2   Period Weeks   Status New   PT SHORT TERM GOAL #5   Title Pt will be independent and consistent at performing his HEP.    Time 2   Period Weeks   Status New           PT Long Term Goals - 02/22/16 1108    PT LONG TERM GOAL #1   Title Pt will demonstrate improved transfers evident by his ability to move from w/c to/from chair via stand pivot transfer and using LRAD for decreased caregiver support.    Baseline Initial: MinA using RW   Time 4   Period Weeks   Status New   PT LONG TERM GOAL #2   Title Pt will demonstrate recreated bathroom transfer with safety and no fall risk with SBA in order to increase independence with mobility in  the home.    Time 4   Period Weeks   Status New   PT LONG TERM GOAL #3   Title Pt will ambulate atleast 150 ft with improved [name] advancement and stability requiring no more than MinA using LRAD to improve his ability to ambulate around his home with decreased assistance.    Time 4   Period Weeks   Status New   PT LONG TERM GOAL #4   Title Pt will be able to correctly answer questions regarding safety scenerios and will demonstrate good safety awareness during transfers and general mobility throughout his sessions.    Time 4   Period Weeks   Status New   PT LONG TERM GOAL #5   Title Pt's wife will demonstrate correct guarding and assistance technqiues with mobility and gait to decrease fall and injury risk at home.    Time 4   Period Weeks   Status New               Plan - 02/22/16 1046    Clinical Impression Statement Pt is a pleasant and highly motivated 50yo M presenting to outpatient rehab with [name] s/p CVA in December of 2016. He did receive some therapy in the hospital as well as in the home environment. He presents with pain and limited endurance, balance, coordination, strength, and ROM impacting his ability to perform bed mobility, transfers and ambulate without increased assistance. He has also had multiple falls since returning home, all of which happened during his transfers from his w/c to the toilet. This is secondary to the listed impairments as well as his lack of an appropriate AD that  will fit through his bathroom door. He would benefit from a RW for home to decrease his risk of falls and further injury along with decreasing caregiver burden as his wife is currently assisting with all acitivity. He would benefit from skilled PT services to address his impairments to improve his mobility and overall functional independce at home and in the community.    Pt will benefit from skilled therapeutic intervention in order to improve on the following deficits Abnormal  gait;Decreased activity tolerance;Decreased balance;Decreased mobility;Decreased knowledge of use of DME;Decreased endurance;Decreased coordination;Decreased range of motion;Decreased strength;Difficulty walking;Increased muscle spasms;Impaired flexibility;Impaired sensation;Improper body mechanics;Postural dysfunction;Impaired tone;Pain   Rehab Potential Good   Clinical Impairments Affecting Rehab Potential Limited visits   PT Frequency 1x / week   PT Duration 4 weeks  Going to talk with rehab supervisor to see if we can approve pt for more visits   PT Treatment/Interventions ADLs/Self Care Home Management;Aquatic Therapy;Biofeedback;Cryotherapy;Electrical Stimulation;Moist Heat;Balance training;Therapeutic exercise;Therapeutic activities;Functional mobility training;Stair training;Gait training;Ultrasound;Neuromuscular re-education;Patient/family education;Orthotic Fit/Training;Wheelchair mobility training;Manual techniques;Taping;Passive range of motion   PT Next Visit Plan Continue with strengthening ther-ex, balance, core stabilization and pre-gait activity progression as appropriate. Teach spouse correct body mechanics with transfers, etc.   PT Home Exercise Plan Seated PWR! sequence   Recommended Other Services Pt would benefit from RW at home as well as KAFO/AFO for improved standing stability   Consulted and Agree with Plan of Care Patient         Problem List Patient Active Problem List   Diagnosis Date Noted  . Dysuria   . OSA (obstructive sleep apnea)   . Hypokalemia   . Elevated blood pressure   . Hemiparesis affecting left side as late effect of stroke ([place])   . Epistaxis   . HTN (hypertension) 11/27/2015  . AKI (acute kidney injury) ([place]) 11/27/2015  . 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 Gait disturbance, post-stroke 11/23/2015  . Stroke due to occlusion of right anterior cerebral  artery ([place]) 11/23/2015  . Cerebrovascular accident (CVA) due to occlusion of right anterior cerebral artery ([place])   . Essential hypertension   . Depression   . Chronic pain syndrome   . ETOH abuse   . Marijuana abuse   . Cerebrovascular accident (CVA) due to thrombosis of right carotid artery ([place])   . Malignant hypertension   . Hyperlipidemia   . CVA (cerebral infarction) 11/18/2015  . Stroke (cerebrum) ([place]) 11/18/2015  . Chest pain 10/09/2012  . GERD (gastroesophageal reflux disease) 10/09/2012  . Chronic back pain 10/09/2012  . Tobacco abuse 10/09/2012  . Hypertensive heart disease 08/31/2012  . Accelerated hypertension 08/31/2012  . Precordial pain 08/31/2012    Elly Modena 02/22/2016, 12:05 PM  [place] [address] [place], [place], [address] Phone: [phone]   Fax:  [phone]  Name: JOHAAN BLEND MRN: [id number] Date of Birth: 09-26-1966

## 2016-02-29 ENCOUNTER — Encounter (HOSPITAL_COMMUNITY): Payer: Medicaid Other

## 2016-03-05 ENCOUNTER — Other Ambulatory Visit: Payer: Self-pay

## 2016-03-05 ENCOUNTER — Ambulatory Visit (HOSPITAL_COMMUNITY): Payer: Medicaid Other | Admitting: Physical Therapy

## 2016-03-05 NOTE — Patient Outreach (Signed)
Telephone outreach to patient to obtain mRS was successfully completed. With patient permission, answers provided by patient's caregiver and wife, Taylor Obrien. mRS = [place], [employer]

## 2016-03-06 ENCOUNTER — Ambulatory Visit (HOSPITAL_COMMUNITY): Payer: Medicaid Other | Admitting: Speech Pathology

## 2016-03-07 ENCOUNTER — Encounter (HOSPITAL_COMMUNITY): Payer: Medicaid Other | Admitting: Occupational Therapy

## 2016-03-11 ENCOUNTER — Ambulatory Visit: Payer: Medicaid Other

## 2016-03-11 ENCOUNTER — Ambulatory Visit: Payer: Medicaid Other | Admitting: Physical Medicine & Rehabilitation

## 2016-03-13 ENCOUNTER — Ambulatory Visit (HOSPITAL_COMMUNITY): Payer: Medicaid Other | Attending: Physical Medicine & Rehabilitation

## 2016-03-13 DIAGNOSIS — R2681 Unsteadiness on feet: Secondary | ICD-10-CM | POA: Diagnosis present

## 2016-03-13 DIAGNOSIS — I6980 Unspecified sequelae of other cerebrovascular disease: Secondary | ICD-10-CM | POA: Insufficient documentation

## 2016-03-13 DIAGNOSIS — I63521 Cerebral infarction due to unspecified occlusion or stenosis of right anterior cerebral artery: Secondary | ICD-10-CM

## 2016-03-13 DIAGNOSIS — R29898 Other symptoms and signs involving the musculoskeletal system: Secondary | ICD-10-CM | POA: Diagnosis present

## 2016-03-13 DIAGNOSIS — M25669 Stiffness of unspecified knee, not elsewhere classified: Secondary | ICD-10-CM

## 2016-03-13 DIAGNOSIS — R279 Unspecified lack of coordination: Secondary | ICD-10-CM | POA: Insufficient documentation

## 2016-03-13 DIAGNOSIS — M6281 Muscle weakness (generalized): Secondary | ICD-10-CM | POA: Diagnosis present

## 2016-03-13 DIAGNOSIS — R531 Weakness: Secondary | ICD-10-CM

## 2016-03-13 DIAGNOSIS — Z658 Other specified problems related to psychosocial circumstances: Secondary | ICD-10-CM | POA: Diagnosis present

## 2016-03-13 DIAGNOSIS — I69954 Hemiplegia and hemiparesis following unspecified cerebrovascular disease affecting left non-dominant side: Secondary | ICD-10-CM | POA: Insufficient documentation

## 2016-03-13 DIAGNOSIS — IMO0002 Reserved for concepts with insufficient information to code with codable children: Secondary | ICD-10-CM

## 2016-03-13 DIAGNOSIS — Z789 Other specified health status: Secondary | ICD-10-CM

## 2016-03-13 DIAGNOSIS — R2689 Other abnormalities of gait and mobility: Secondary | ICD-10-CM | POA: Insufficient documentation

## 2016-03-13 NOTE — Patient Instructions (Signed)
Bridging    Slowly raise buttocks from floor, keeping stomach tight. Repeat 10 times per set. Do 1-2 sets per session. Do ____ sessions per day.  http://orth.exer.us/1096   Copyright  VHI. All rights reserved.   BED MOBILITY: Rolling    Bend knees and hips; reach arm across chest. Roll to side.5 reps per set. Roll legs and reach arm at same time with spinal precautions  Copyright  VHI. All rights reserved.

## 2016-03-13 NOTE — Therapy (Signed)
[place], [place], [address] Phone: [phone]   Fax:  [phone]  Physical Therapy Treatment  Patient Details  Name: FRANCESCA VOS MRN: [id number] Date of Birth: [birth year] Referring Provider: Alysia Penna, MD  Encounter Date: 03/13/2016      PT End of Session - 03/13/16 1202    Visit Number 2   Number of Visits 4   Date for PT Re-Evaluation 03/21/16   Authorization Type Medicaid [place]    Authorization Time Period 02/22/16 to 03/21/16: [employer] approval dates 02/28/2016-03/26/2016 4 units   PT Start Time 1022   PT Stop Time 1102   PT Time Calculation (min) 40 min   Equipment Utilized During Treatment Gait belt   Activity Tolerance Patient tolerated treatment well;Patient limited by fatigue   Behavior During Therapy [facility] for tasks assessed/performed      Past Medical History  Diagnosis Date  . Essential hypertension, benign   . Hyperlipidemia   . Depression   . Chronic pain   . GERD (gastroesophageal reflux disease)   . HTN (hypertension) 11/27/2015    Past Surgical History  Procedure Laterality Date  . Right rotator cuff repair    . Closed reduction mandibular fracture w/ arch bars      + multiple extractions  . Removal foreign body right shoulder    . Condyloma resection    . Radiology with anesthesia N/A 11/18/2015    Procedure: RADIOLOGY WITH ANESTHESIA;  Surgeon: Luanne Bras, MD;  Location: [place];  Service: Radiology;  Laterality: N/A;  . Tee without cardioversion N/A 11/21/2015    Procedure: TRANSESOPHAGEAL ECHOCARDIOGRAM (TEE);  Surgeon: Larey Dresser, MD;  Location: [place];  Service: Cardiovascular;  Laterality: N/A;    There were no vitals filed for this visit.  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   Pertinent History HTN, CVA, Depression, GERD, Chronic pain syndrome, R RTC sx.    Patient Stated Goals moving and walking better   Currently in Pain? No/denies           [facility] Adult PT Treatment/Exercise - 03/13/16 0001    Bed Mobility   Bed Mobility Rolling Right;Rolling Left;Right Sidelying to Sit;Left Sidelying to Sit   Rolling Right 6: Modified independent (Device/Increase time)   Rolling Left 6: Modified independent (Device/Increase time)   Supine to Sit 4: Min assist   Transfers   Transfers Stand Pivot Transfers;Sit to Stand;Stand to Sit   Sit to Stand 4: Min assist   Stand Pivot Transfers 4: Min assist   Comments 5 STS max cueing for handplacement   Knee/Hip Exercises: Seated   Sit to Sand 5 reps;with UE support  cueing for handplacement for safety   Knee/Hip Exercises: Supine   Bridges Limitations 2sets x 5 reps   Knee/Hip Exercises: Sidelying   Hip ABduction Right;15 reps                PT Education - 03/13/16 1149    Education provided Yes   Education Details Reviewed goals, compliance with HEP and  copy of eval given to pt/ spouse; Wife educated on Economist for transfers   Person(s) Educated Patient   Methods Explanation;Demonstration;Handout   Comprehension Verbalized understanding;Returned demonstration;Need further instruction          PT Short Term Goals - 02/22/16 1059    PT SHORT TERM GOAL #1   Title Pt will demonstrate improved w/c safety and mobility evident by his ability to lock/unlock wheels prior to transfer to various surfaces as well as navigating around obstacles throughout his session without cuing from therapist.   Time 2   Period Weeks    Status New   PT SHORT TERM GOAL #2   Title Pt will be able to demonstrate pressure relief techniques to decrease pain raiting in his low back and buttock region.    Time 2   Period Weeks   Status New   PT SHORT TERM GOAL #3   Title Pt will demonstrate improved bed mobility evident by his ability to move supine to sit each direction modI without cuing from therapist to assist with him getting out of bed in the morning.    Baseline Initial: ModI with verbal cues   Time 2   Period Weeks   Status New   PT SHORT TERM GOAL #4   Title Pt will demonstrate improved strength evident by his ability to perform sit to stand with ModI using RW to improve his transfers.   Time 2   Period Weeks   Status New   PT SHORT TERM GOAL #5   Title Pt will be independent and consistent at performing his HEP.    Time 2   Period Weeks   Status New           PT Long Term Goals - 02/22/16 1108    PT LONG TERM GOAL #1   Title Pt will demonstrate improved transfers evident by his ability to move from w/c to/from chair via stand pivot transfer and using LRAD for decreased caregiver support.    Baseline Initial: MinA using RW   Time 4   Period Weeks   Status New   PT LONG TERM GOAL #2   Title Pt will demonstrate recreated bathroom transfer with safety and no fall risk with SBA in order to increase independence with mobility in the home.    Time 4   Period Weeks   Status New   PT LONG TERM GOAL #3   Title Pt will ambulate atleast 150 ft with improved [name] advancement and stability requiring no more than MinA using LRAD to improve his ability to ambulate around his home with decreased assistance.    Time 4   Period Weeks   Status New   PT LONG TERM GOAL #4   Title Pt will be able to correctly answer questions regarding safety scenerios and will demonstrate good safety awareness during transfers and general mobility throughout his sessions.    Time 4   Period Weeks   Status New   PT LONG TERM GOAL #5    Title Pt's wife will demonstrate correct guarding and assistance technqiues with mobility and gait to decrease fall and injury risk at home.    Time 4   Period Weeks   Status New               Plan - 03/13/16 1247    Clinical Impression Statement Reviewed goals, compliance with HEP and copy of eval given to pt.  Pt and wife educated on importance  of proper body mechanics with transfers, able to complete independent sliding transfer from w/c <> mat with cueing for proper body mechnaics and handplacement for safety.  Pt educated on importance of improving awareness with safety to reduce risk of falls, constant cueing for handplacement prior sit to stand and to lock breaks prior transfers.  Pt able to complete rolling tasks independently with cueing to be aware of surrounding to reduce risk of rolling off mat.  Pt will continue to benefit from skilled intervention to improve awareness for safety and reduce risk of falls as well as education to spouse for safe body mechanics.     Pt will benefit from skilled therapeutic intervention in order to improve on the following deficits Abnormal gait;Decreased activity tolerance;Decreased balance;Decreased mobility;Decreased knowledge of use of DME;Decreased endurance;Decreased coordination;Decreased range of motion;Decreased strength;Difficulty walking;Increased muscle spasms;Impaired flexibility;Impaired sensation;Improper body mechanics;Postural dysfunction;Impaired tone;Pain   Rehab Potential Good   PT Frequency 1x / week   PT Duration 4 weeks   PT Treatment/Interventions ADLs/Self Care Home Management;Aquatic Therapy;Biofeedback;Cryotherapy;Electrical Stimulation;Moist Heat;Balance training;Therapeutic exercise;Therapeutic activities;Functional mobility training;Stair training;Gait training;Ultrasound;Neuromuscular re-education;Patient/family education;Orthotic Fit/Training;Wheelchair mobility training;Manual techniques;Taping;Passive range of motion    PT Next Visit Plan Continue with strengthening ther-ex, balance, core stabilization and pre-gait activity progression as appropriate. Teach spouse correct body mechanics with transfers, etc.        Problem List Patient Active Problem List   Diagnosis Date Noted  . Dysuria   . OSA (obstructive sleep apnea)   . Hypokalemia   . Elevated blood pressure   . Hemiparesis affecting left side as late effect of stroke ([place])   . Epistaxis   . HTN (hypertension) 11/27/2015  . AKI (acute kidney injury) ([place]) 11/27/2015  . Hemiplegia and hemiparesis following unspecified cerebrovascular disease affecting left non-dominant side ([place]) 11/23/2015  . Gait disturbance, post-stroke 11/23/2015  . Stroke due to occlusion of right anterior cerebral artery ([place]) 11/23/2015  . Cerebrovascular accident (CVA) due to occlusion of right anterior cerebral artery ([place])   . Essential hypertension   . Depression   . Chronic pain syndrome   . ETOH abuse   . Marijuana abuse   . Cerebrovascular accident (CVA) due to thrombosis of right carotid artery ([place])   . Malignant hypertension   . Hyperlipidemia   . CVA (cerebral infarction) 11/18/2015  . Stroke (cerebrum) ([place]) 11/18/2015  . Chest pain 10/09/2012  . GERD (gastroesophageal reflux disease) 10/09/2012  . Chronic back pain 10/09/2012  . Tobacco abuse 10/09/2012  . Hypertensive heart disease 08/31/2012  . Accelerated hypertension 08/31/2012  . Precordial pain 08/31/2012   Ihor Austin, [place]; [place]  Aldona Lento 03/13/2016, 12:56 PM  [facility] [address] [place], [place], [address] Phone: [phone]   Fax:  [phone]  Name: RESHARD KITTELSON MRN: [id number] Date of Birth: November 20, 1966

## 2016-03-14 ENCOUNTER — Encounter (HOSPITAL_COMMUNITY): Payer: Medicaid Other | Admitting: Occupational Therapy

## 2016-03-19 ENCOUNTER — Other Ambulatory Visit: Payer: Self-pay | Admitting: *Deleted

## 2016-03-19 ENCOUNTER — Ambulatory Visit (INDEPENDENT_AMBULATORY_CARE_PROVIDER_SITE_OTHER): Payer: Medicaid Other | Admitting: Neurology

## 2016-03-19 ENCOUNTER — Encounter: Payer: Self-pay | Admitting: Neurology

## 2016-03-19 VITALS — BP 120/79 | HR 89 | Ht 65.0 in

## 2016-03-19 DIAGNOSIS — I639 Cerebral infarction, unspecified: Secondary | ICD-10-CM | POA: Diagnosis not present

## 2016-03-19 MED ORDER — TIZANIDINE HCL 4 MG PO TABS: 8.0000 mg | ORAL_TABLET | Freq: Every day | ORAL | Status: DC

## 2016-03-19 NOTE — Patient Instructions (Signed)
I had a long d/w patient and his wife about his recent stroke, risk for recurrent stroke/TIAs, personally independently reviewed imaging studies and stroke evaluation results and answered questions.Continue aspirin 325 mg daily  for secondary stroke prevention and maintain strict control of hypertension with blood pressure goal below 130/90, diabetes with hemoglobin A1c goal below 6.5% and lipids with LDL cholesterol goal below 70 mg/dL. I complimented him on stopping smoking and drinking alcohol and encouraged him to continue to do so. I also advised the patient to eat a healthy diet with plenty of whole grains, cereals, fruits and vegetables, exercise regularly and maintain ideal body weight .Continue ongoing outpatient physical and occupational therapy. Followup in the future with stroke NP in 6 months or call earlier if necessary.  Stroke Prevention Some medical conditions and behaviors are associated with an increased chance of having a stroke. You may prevent a stroke by making healthy choices and managing medical conditions. HOW CAN I REDUCE MY RISK OF HAVING A STROKE?   Stay physically active. Get at least 30 minutes of activity on most or all days.  Do not smoke. It may also be helpful to avoid exposure to secondhand smoke.  Limit alcohol use. Moderate alcohol use is considered to be:  No more than 2 drinks per day for men.  No more than 1 drink per day for nonpregnant women.  Eat healthy foods. This involves:  Eating 5 or more servings of fruits and vegetables a day.  Making dietary changes that address high blood pressure (hypertension), high cholesterol, diabetes, or obesity.  Manage your cholesterol levels.  Making food choices that are high in fiber and low in saturated fat, trans fat, and cholesterol may control cholesterol levels.  Take any prescribed medicines to control cholesterol as directed by your health care provider.  Manage your diabetes.  Controlling your  carbohydrate and sugar intake is recommended to manage diabetes.  Take any prescribed medicines to control diabetes as directed by your health care provider.  Control your hypertension.  Making food choices that are low in salt (sodium), saturated fat, trans fat, and cholesterol is recommended to manage hypertension.  Ask your health care provider if you need treatment to lower your blood pressure. Take any prescribed medicines to control hypertension as directed by your health care provider.  If you are 47-78 years of age, have your blood pressure checked every 3-5 years. If you are 95 years of age or older, have your blood pressure checked every year.  Maintain a healthy weight.  Reducing calorie intake and making food choices that are low in sodium, saturated fat, trans fat, and cholesterol are recommended to manage weight.  Stop drug abuse.  Avoid taking birth control pills.  Talk to your health care provider about the risks of taking birth control pills if you are over 47 years old, smoke, get migraines, or have ever had a blood clot.  Get evaluated for sleep disorders (sleep apnea).  Talk to your health care provider about getting a sleep evaluation if you snore a lot or have excessive sleepiness.  Take medicines only as directed by your health care provider.  For some people, aspirin or blood thinners (anticoagulants) are helpful in reducing the risk of forming abnormal blood clots that can lead to stroke. If you have the irregular heart rhythm of atrial fibrillation, you should be on a blood thinner unless there is a good reason you cannot take them.  Understand all your medicine instructions.  Make  sure that other conditions (such as anemia or atherosclerosis) are addressed. SEEK IMMEDIATE MEDICAL CARE IF:   You have sudden weakness or numbness of the face, arm, or leg, especially on one side of the body.  Your face or eyelid droops to one side.  You have sudden  confusion.  You have trouble speaking (aphasia) or understanding.  You have sudden trouble seeing in one or both eyes.  You have sudden trouble walking.  You have dizziness.  You have a loss of balance or coordination.  You have a sudden, severe headache with no known cause.  You have new chest pain or an irregular heartbeat. Any of these symptoms may represent a serious problem that is an emergency. Do not wait to see if the symptoms will go away. Get medical help at once. Call your local emergency services (911 in [place] Do not drive yourself to the hospital.   This information is not intended to replace advice given to you by your health care provider. Make sure you discuss any questions you have with your health care provider.   Document Released: 01/02/2005 Document Revised: 12/16/2014 Document Reviewed: 05/28/2013 Elsevier Interactive Patient Education [employer].

## 2016-03-19 NOTE — Progress Notes (Signed)
[facility] [address] [place]. [place] [address] [phone]       OFFICE FOLLOW-UP NOTE  Taylor. Taylor Obrien Date of Birth:  Oct 30, 1966 Medical Record Number:  [id number]   HPI: Taylor Obrien is a 60 year African-American male seen for first office follow-up visit following hospital admission for stroke in December 2016. He is accompanied today by his wife. Taylor Obrien is a 50 y.o. male with a history of htn who presented with acute onset left sided weakness that started right at 9am on the day of admission. He was evaluated at [facility] and had a CTA which shows an M2 occlusion. He has a continued NIH of 9 after tpa and had a lesion possibly amenable to intervention. Therefore Dr. Estanislado Pandy performed angiogram . Findings below. 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 CT angiogram showed the occlusion of the anterior division of the right middle cerebral artery. Catheter angiogram done by Dr. Estanislado Pandy showed occlusion of the right anterior cerebral artery in the proximal A2 segment but the right middle cerebral artery was patent. Patient had dense left leg weakness which persisted throughout hospitalization. He was seen by physical occupational speech therapy and felt to be a good patient for inpatient rehabilitation where he was transferred. LDL cholesterol was elevated at 179 for which he was started on Lipitor. Hemoglobin A1c was 5.9. Urine drug screen was positive for marijuana. Patient was counseled to quit smoking cigarettes, marijuana as well as drinking alcohol which he states he has done. Transthoracic echo showed normal ejection fraction and transesophageal echo showed no cardiac source of embolism. Patient  has been home and has finished physical and occupational therapy and is currently but spitting and outpatient therapies. Unfortunately he remains quite plegic in his left leg and is unable to bear weight or walk even with assistance. He has 24-hour help at home. His blood pressure is well controlled and today it is 120/79. He is tolerating aspirin well without bleeding or bruising. ROS:   14 system review of systems is positive for  restless leg, apnea, frequent waking, snoring, back pain, joint swelling, aching muscles, muscle cramps, walking difficulty, neck pain, neck stiffness, agitation, behavior problem, depression and all other systems negative PMH:  Past Medical History  Diagnosis Date  . Essential hypertension, benign   . Hyperlipidemia   . Depression   . Chronic pain   . GERD (gastroesophageal reflux disease)   . HTN (hypertension) 11/27/2015  . Stroke [facility])     Social History:  Social History   Social History  . Marital Status: Married    Spouse Name: N/A  . Number of Children: N/A  . Years of Education: N/A   Occupational History  . Not on file.   Social History Main Topics  . Smoking status: Former Smoker -- 0.50 packs/day for 30 years    Types: Cigarettes    Quit date: 11/17/2015  . Smokeless tobacco: Never Used  . Alcohol Use: No  . Drug Use: Yes    Special: Marijuana  . Sexual Activity: Yes    Birth Control/ Protection: None   Other Topics Concern  . Not on file   Social History Narrative    Medications:   Current Outpatient  Prescriptions on File Prior to Visit  Medication Sig Dispense Refill  . acetaminophen (TYLENOL) 500 MG tablet Take 2 tablets (1,000 mg total) by mouth every 8 (eight) hours as needed. 30 tablet 0  . ALPRAZolam (XANAX) 0.25 MG tablet Take 1 tablet (0.25 mg total) by mouth 2 (two) times daily as needed for anxiety (uncontrolled left arm movements). 15 tablet 0  . amLODipine (NORVASC) 10 MG tablet Take 1 tablet (10 mg total) by mouth  daily. 30 tablet 0  . aspirin 325 MG tablet Take 1 tablet (325 mg total) by mouth daily. 100 tablet 0  . atorvastatin (LIPITOR) 40 MG tablet Take 1 tablet (40 mg total) by mouth daily at 6 PM. 30 tablet 0  . chlorthalidone (HYGROTON) 50 MG tablet Take 1 tablet (50 mg total) by mouth daily. 30 tablet 0  . citalopram (CELEXA) 10 MG tablet Take 1 tablet (10 mg total) by mouth daily. 30 tablet 0  . cloNIDine (CATAPRES - DOSED IN MG/24 HR) 0.3 mg/24hr patch Place 1 patch (0.3 mg total) onto the skin once a week. Next change on monday 4 patch 12  . clotrimazole (LOTRIMIN) 1 % cream Apply topically 2 (two) times daily. 60 g 0  . folic acid (FOLVITE) 1 MG tablet Take 1 tablet (1 mg total) by mouth daily. 30 tablet 0  . hydrocerin (EUCERIN) CREA Apply 1 application topically 3 (three) times daily. Available over the counter  0  . HYDROcodone-acetaminophen (NORCO) 7.5-325 MG tablet Take 1 tablet by mouth every 8 (eight) hours as needed for moderate pain. 60 tablet 0  . labetalol (NORMODYNE) 200 MG tablet Take 1 tablet (200 mg total) by mouth 3 (three) times daily. 90 tablet 0  . Multiple Vitamin (MULTIVITAMIN WITH MINERALS) TABS tablet Take 1 tablet by mouth daily. 100 tablet 1  . nicotine (NICODERM CQ - DOSED IN MG/24 HR) 7 mg/24hr patch Place 2 patches (14 mg total) onto the skin daily. 30 patch 1  . pantoprazole (PROTONIX) 40 MG tablet Take 1 tablet (40 mg total) by mouth at bedtime. 30 tablet 0  . potassium chloride SA (K-DUR,KLOR-CON) 20 MEQ tablet Take 1 tablet (20 mEq total) by mouth 2 (two) times daily. 60 tablet 0  . sodium chloride (OCEAN) 0.65 % SOLN nasal spray Place 1 spray into both nostrils as needed for congestion.  0  . tiZANidine (ZANAFLEX) 4 MG tablet Take 2 tablets (8 mg total) by mouth at bedtime. 60 tablet 1  . traZODone (DESYREL) 100 MG tablet Take 1 tablet (100 mg total) by mouth at bedtime. 30 tablet 1   No current facility-administered medications on file prior to visit.     Allergies:   Allergies  Allergen Reactions  . Amoxicillin   . Oxcarbazepine Other (See Comments)    Patient goes out of right state of mind.   . Penicillins     Physical Exam General: well developed, well nourished, seated, in no evident distress Head: head normocephalic and atraumatic.  Neck: supple with no carotid or supraclavicular bruits Cardiovascular: regular rate and rhythm, no murmurs Musculoskeletal: no deformity Skin:  no rash/petichiae Vascular:  Normal pulses all extremities Filed Vitals:   03/19/16 0905  BP: 120/79  Pulse: 89   Neurologic Exam Mental Status: Awake and fully alert. Oriented to place and time. Recent and remote memory intact. Attention span, concentration and fund of knowledge appropriate. Mood and affect appropriate.  Cranial Nerves: Fundoscopic exam reveals sharp disc margins. Pupils equal, briskly reactive to  light. Extraocular movements full without nystagmus. Visual fields full to confrontation. Hearing intact. Facial sensation intact. Face, tongue, palate moves normally and symmetrically.  Motor: Normal bulk and tone. Normal strength in all tested extremity muscles except left lower extremity is 0/5 with increase tone and spasticity.. Sensory.: intact to touch ,pinprick .position and vibratory sensation.  Coordination: Rapid alternating movements normal in all extremities. Finger-to-nose and heel-to-shin performed accurately bilaterally. Gait and Station: unable to arise from wheel chair hence gait deferred Reflexes: 1+ and symmetric. Toes downgoing.   NIHSS  3 Modified Rankin  4   ASSESSMENT: 80 year African-American male with the right anterior cerebral artery infarct in December 2016 of likely cryptogenic etiology with multiple vascular risk factors of hypertension, hyperlipidemia and smoking cigarettes, marijuana and alcohol.    PLAN: I had a long d/w patient and his wife about his recent stroke, risk for recurrent stroke/TIAs,  personally independently reviewed imaging studies and stroke evaluation results and answered questions.Continue aspirin 325 mg daily  for secondary stroke prevention and maintain strict control of hypertension with blood pressure goal below 130/90, diabetes with hemoglobin A1c goal below 6.5% and lipids with LDL cholesterol goal below 70 mg/dL. I complimented him on stopping smoking and drinking alcohol and encouraged him to continue to do so. I also advised the patient to eat a healthy diet with plenty of whole grains, cereals, fruits and vegetables, exercise regularly and maintain ideal body weight .Continue ongoing outpatient physical and occupational therapy.Greater than 50% of time during this 25 minute visit was spent on counseling,explanation of diagnosis, planning of further management, discussion with patient and family and coordination of care Followup in the future with stroke NP in 6 months or call earlier if necessary. Antony Contras, MD    Note: This document was prepared with digital dictation and possible smart phrase technology. Any transcriptional errors that result from this process are unintentional

## 2016-03-21 ENCOUNTER — Ambulatory Visit (HOSPITAL_COMMUNITY): Payer: Medicaid Other | Admitting: Physical Therapy

## 2016-03-21 DIAGNOSIS — R279 Unspecified lack of coordination: Secondary | ICD-10-CM

## 2016-03-21 DIAGNOSIS — M6281 Muscle weakness (generalized): Secondary | ICD-10-CM

## 2016-03-21 DIAGNOSIS — R2689 Other abnormalities of gait and mobility: Secondary | ICD-10-CM

## 2016-03-21 DIAGNOSIS — I69954 Hemiplegia and hemiparesis following unspecified cerebrovascular disease affecting left non-dominant side: Secondary | ICD-10-CM | POA: Diagnosis not present

## 2016-03-21 NOTE — Patient Instructions (Signed)
ADDITIONS TO HOME PROGRAM 03/21/16  1) FORWARD REACHING  While sitting solidly and comfortably on your wheelchair, place some targets on another chair in front of you and reach forward with each arm, 10 times each.            2) SIDE TO SIDE TRUNK MOTION   While sitting solidly and comfortably on the side of the couch or your bed, with both feet on the floor, drop to the right side so that you are putting your weight down through your right elbow/forearm, then use your tummy muscles to pull yourself up.  Repeat 10 times each side, making sure you are really putting weight down through your left arm especially when you go to the left.         3) PASSIVE ROTATION OF LEFT LEG TO REDUCE TONE   While you are laying down or sitting in your chair, you can have a family member passively (they do the work, you do not) rotate your left leg as demonstrated by PT today.

## 2016-03-21 NOTE — Therapy (Signed)
[facility] [place], [place], [address] Phone: [phone]   Fax:  [phone]  Physical Therapy Treatment  Patient Details  Name: Taylor Obrien MRN: [id number] Date of Birth: May 05, 1966 Referring Provider: Alysia Penna, MD  Encounter Date: 03/21/2016      PT End of Session - 03/21/16 1246    Visit Number 3   Number of Visits 4   Authorization Type Medicaid [place]    Authorization Time Period 02/22/16 to 03/21/16: [employer] approval dates 02/28/2016-03/26/2016 4 units; re-auth done 4/13 for last visit    PT Start Time 0944   PT Stop Time 1031   PT Time Calculation (min) 47 min   Equipment Utilized During Treatment Gait belt   Activity Tolerance Patient tolerated treatment well   Behavior During Therapy [facility] for tasks assessed/performed      Past Medical History  Diagnosis Date  . Essential hypertension, benign   . Hyperlipidemia   . Depression   . Chronic pain   . GERD (gastroesophageal reflux disease)   . HTN (hypertension) 11/27/2015  . Stroke [facility])     Past Surgical History  Procedure Laterality Date  . Right rotator cuff repair    . Closed reduction mandibular fracture w/ arch bars      + multiple extractions  . Removal foreign body right shoulder    . Condyloma resection    . Radiology with anesthesia N/A 11/18/2015    Procedure: RADIOLOGY WITH ANESTHESIA;  Surgeon: Luanne Bras, MD;  Location: [place];  Service: Radiology;  Laterality: N/A;  . Tee without cardioversion N/A 11/21/2015    Procedure: TRANSESOPHAGEAL ECHOCARDIOGRAM (TEE);  Surgeon: Larey Dresser, MD;  Location: [place];  Service: Cardiovascular;  Laterality: N/A;    There were no vitals filed for this visit.      Subjective Assessment - 03/21/16 1242    Subjective Patient reports he is doing well today, no pain and feels good    Pertinent History HTN, CVA, Depression, GERD, Chronic pain syndrome, R RTC sx.    Currently in Pain?  No/denies                         [facility] Adult PT Treatment/Exercise - 03/21/16 0001    Transfers   Comments WC to mat table close supervision/min guard    Knee/Hip Exercises: Seated   Other Seated Knee/Hip Exercises forward reaches t moving targer of cones; back extension into swiss ball; lateral turnk crunches elbow to table; cone rotations    Knee/Hip Exercises: Supine   Bridges Limitations 2x10; cues for form and definitive motion    Other Supine Knee/Hip Exercises supine hip ABD, Max assist for L LE 2x10 each                 PT Education - 03/21/16 1245    Education provided Yes   Education Details PT needs to update Medicaid re-auth before last visit can occur; wil call when clear    Person(s) Educated Patient;Child(ren)   Methods Explanation   Comprehension Verbalized understanding          PT Short Term Goals - 02/22/16 1059    PT SHORT TERM GOAL #1   Title Pt will demonstrate improved w/c safety and mobility evident by his ability to lock/unlock wheels prior to transfer to various surfaces as well as navigating around obstacles throughout his session without cuing from therapist.   Time 2  Period Weeks   Status New   PT SHORT TERM GOAL #2   Title Pt will be able to demonstrate pressure relief techniques to decrease pain raiting in his low back and buttock region.    Time 2   Period Weeks   Status New   PT SHORT TERM GOAL #3   Title Pt will demonstrate improved bed mobility evident by his ability to move supine to sit each direction modI without cuing from therapist to assist with him getting out of bed in the morning.    Baseline Initial: ModI with verbal cues   Time 2   Period Weeks   Status New   PT SHORT TERM GOAL #4   Title Pt will demonstrate improved strength evident by his ability to perform sit to stand with ModI using RW to improve his transfers.   Time 2   Period Weeks   Status New   PT SHORT TERM GOAL #5   Title Pt will be  independent and consistent at performing his HEP.    Time 2   Period Weeks   Status New           PT Long Term Goals - 02/22/16 1108    PT LONG TERM GOAL #1   Title Pt will demonstrate improved transfers evident by his ability to move from w/c to/from chair via stand pivot transfer and using LRAD for decreased caregiver support.    Baseline Initial: MinA using RW   Time 4   Period Weeks   Status New   PT LONG TERM GOAL #2   Title Pt will demonstrate recreated bathroom transfer with safety and no fall risk with SBA in order to increase independence with mobility in the home.    Time 4   Period Weeks   Status New   PT LONG TERM GOAL #3   Title Pt will ambulate atleast 150 ft with improved [name] advancement and stability requiring no more than MinA using LRAD to improve his ability to ambulate around his home with decreased assistance.    Time 4   Period Weeks   Status New   PT LONG TERM GOAL #4   Title Pt will be able to correctly answer questions regarding safety scenerios and will demonstrate good safety awareness during transfers and general mobility throughout his sessions.    Time 4   Period Weeks   Status New   PT LONG TERM GOAL #5   Title Pt's wife will demonstrate correct guarding and assistance technqiues with mobility and gait to decrease fall and injury risk at home.    Time 4   Period Weeks   Status New               Plan - 03/21/16 1247    Clinical Impression Statement focused on functional proximal muscle strengthening, also core activiation and mobility drills sitting at side of table today, with Mod assist and Mod faciliation for majority of activities today. Educated patient that he will need towait until Medicaid re-auth comes back before we can complete last session due to how dates of auth/scheduling worked out.    Rehab Potential Good   Clinical Impairments Affecting Rehab Potential Limited visits   PT Frequency Other (comment)  one more visit     PT Duration Other (comment)  one more visit    PT Treatment/Interventions ADLs/Self Care Home Management;Aquatic Therapy;Biofeedback;Cryotherapy;Electrical Stimulation;Moist Heat;Balance training;Therapeutic exercise;Therapeutic activities;Functional mobility training;Stair training;Gait training;Ultrasound;Neuromuscular re-education;Patient/family education;Orthotic Fit/Training;Wheelchair mobility training;Manual techniques;Taping;Passive range  of motion   PT Next Visit Plan Continue with strengthening ther-ex, balance, core stabilization and pre-gait activity progression as appropriate. Teach spouse correct body mechanics with transfers, etc.One last visit, don't worry about measures.    PT Home Exercise Plan updated today   Consulted and Agree with Plan of Care Patient      Patient will benefit from skilled therapeutic intervention in order to improve the following deficits and impairments:  Abnormal gait, Decreased activity tolerance, Decreased balance, Decreased mobility, Decreased knowledge of use of DME, Decreased endurance, Decreased coordination, Decreased range of motion, Decreased strength, Difficulty walking, Increased muscle spasms, Impaired flexibility, Impaired sensation, Improper body mechanics, Postural dysfunction, Impaired tone, Pain  Visit Diagnosis: Unspecified lack of coordination - Plan: PT plan of care cert/re-cert  Muscle weakness (generalized) - Plan: PT plan of care cert/re-cert  Other abnormalities of gait and mobility - Plan: PT plan of care cert/re-cert     Problem List Patient Active Problem List   Diagnosis Date Noted  . Dysuria   . OSA (obstructive sleep apnea)   . Hypokalemia   . Elevated blood pressure   . Hemiparesis affecting left side as late effect of stroke ([place])   . Epistaxis   . HTN (hypertension) 11/27/2015  . AKI (acute kidney injury) ([place]) 11/27/2015  . Hemiplegia and hemiparesis following unspecified cerebrovascular disease affecting left  non-dominant side ([place]) 11/23/2015  . Gait disturbance, post-stroke 11/23/2015  . Stroke due to occlusion of right anterior cerebral artery ([place]) 11/23/2015  . Cerebrovascular accident (CVA) due to occlusion of right anterior cerebral artery ([place])   . Essential hypertension   . Depression   . Chronic pain syndrome   . ETOH abuse   . Marijuana abuse   . Cerebrovascular accident (CVA) due to thrombosis of right carotid artery ([place])   . Malignant hypertension   . Hyperlipidemia   . CVA (cerebral infarction) 11/18/2015  . Stroke (cerebrum) ([place]) 11/18/2015  . Chest pain 10/09/2012  . GERD (gastroesophageal reflux disease) 10/09/2012  . Chronic back pain 10/09/2012  . Tobacco abuse 10/09/2012  . Hypertensive heart disease 08/31/2012  . Accelerated hypertension 08/31/2012  . Precordial pain 08/31/2012    Deniece Ree PT, DPT [place] [facility] [address] [place], [place], [address] Phone: [phone]   Fax:  [phone]  Name: MAURIZIO BRANDENBERGER MRN: [id number] Date of Birth: 09-Oct-1966

## 2016-03-28 ENCOUNTER — Ambulatory Visit (HOSPITAL_COMMUNITY): Payer: Medicaid Other | Admitting: Physical Therapy

## 2016-04-02 ENCOUNTER — Encounter: Payer: Medicaid Other | Attending: Physical Medicine & Rehabilitation

## 2016-04-02 ENCOUNTER — Encounter: Payer: Self-pay | Admitting: Physical Medicine & Rehabilitation

## 2016-04-02 ENCOUNTER — Ambulatory Visit (HOSPITAL_BASED_OUTPATIENT_CLINIC_OR_DEPARTMENT_OTHER): Payer: Medicaid Other | Admitting: Physical Medicine & Rehabilitation

## 2016-04-02 VITALS — BP 126/76 | HR 80 | Resp 16

## 2016-04-02 DIAGNOSIS — G8929 Other chronic pain: Secondary | ICD-10-CM | POA: Diagnosis present

## 2016-04-02 DIAGNOSIS — I69319 Unspecified symptoms and signs involving cognitive functions following cerebral infarction: Secondary | ICD-10-CM | POA: Insufficient documentation

## 2016-04-02 DIAGNOSIS — G8110 Spastic hemiplegia affecting unspecified side: Secondary | ICD-10-CM | POA: Diagnosis not present

## 2016-04-02 DIAGNOSIS — E785 Hyperlipidemia, unspecified: Secondary | ICD-10-CM | POA: Insufficient documentation

## 2016-04-02 DIAGNOSIS — I10 Essential (primary) hypertension: Secondary | ICD-10-CM | POA: Insufficient documentation

## 2016-04-02 DIAGNOSIS — F329 Major depressive disorder, single episode, unspecified: Secondary | ICD-10-CM | POA: Insufficient documentation

## 2016-04-02 DIAGNOSIS — G8114 Spastic hemiplegia affecting left nondominant side: Secondary | ICD-10-CM | POA: Diagnosis present

## 2016-04-02 DIAGNOSIS — K219 Gastro-esophageal reflux disease without esophagitis: Secondary | ICD-10-CM | POA: Insufficient documentation

## 2016-04-02 NOTE — Progress Notes (Signed)
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  Informed consent was obtained after describing the risks and benefits of the procedure with the patient this includes bleeding bruising and infection as well as medication side effects. The patient elected to proceed and has given written consent. Patient placed in a prone position on the exam table. External DC stimulation was applied to the popliteal space using a nerve stimulator. Plantar flexion twitch was obtained. The popliteal region was prepped with Betadine and then entered with a 22-gauge 40 mm needle electrode under electrical stimulation guidance. Plantar flexion which was obtained and confirmed. Then 4 cc of 5% phenol were injected. The patient tolerated procedure well. Post procedure instructions and followup visit were given. 

## 2016-04-02 NOTE — Patient Instructions (Signed)
Tibial nerve block with phenol today. This medication may start taking the fact today however full effect will be at about one week Duration of the effect is 3-6 months Side effects of medication may include right heel numbness or burning. Call if you have burning pain so we can recommend any medication for that. 

## 2016-04-23 ENCOUNTER — Ambulatory Visit (INDEPENDENT_AMBULATORY_CARE_PROVIDER_SITE_OTHER): Payer: Medicaid Other | Admitting: Orthopaedic Surgery

## 2016-04-23 ENCOUNTER — Encounter: Payer: Self-pay | Admitting: Orthopaedic Surgery

## 2016-04-23 VITALS — BP 152/88 | HR 76 | Temp 97.7°F | Ht 65.0 in | Wt 203.0 lb

## 2016-04-23 DIAGNOSIS — I69354 Hemiplegia and hemiparesis following cerebral infarction affecting left non-dominant side: Secondary | ICD-10-CM | POA: Diagnosis not present

## 2016-04-23 DIAGNOSIS — I10 Essential (primary) hypertension: Secondary | ICD-10-CM

## 2016-04-23 DIAGNOSIS — M25511 Pain in right shoulder: Secondary | ICD-10-CM | POA: Diagnosis not present

## 2016-04-23 MED ORDER — TRAMADOL HCL 50 MG PO TABS: 50.0000 mg | ORAL_TABLET | Freq: Four times a day (QID) | ORAL | Status: DC | PRN

## 2016-04-23 NOTE — Progress Notes (Signed)
Patient Taylor Obrien, male DOB:[birth year], 50 y.o. [id number]  Chief Complaint  Patient presents with  . Follow-up    Right shoulder pain    HPI  Taylor Obrien is a 50 y.o. male who has had lower back pain for some time.  I have not seen him since September.  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  He has residual of left sided weakness now.  He is in a wheelchair.  His right shoulder is painful.  He has some popping at times.  He has no new trauma or paresthesias.   Shoulder Pain  The pain is present in the right shoulder. This is a chronic problem. The current episode started more than 1 month ago. There has been no history of extremity trauma. The problem occurs daily. The problem has been gradually worsening. The quality of the pain is described as aching. The pain is at a severity of 4/10. The pain is moderate. The symptoms are aggravated by activity. He has tried cold, heat, NSAIDS and acetaminophen for the symptoms. The treatment provided moderate relief.   I did surgery on the right rotator cuff many years ago.  He had done well until recently.  I reviewed the information about his CVA.  He has hypertension controlled now.  It was very high in the past.  Body mass index is 33.78 kg/(m^2).   Review of Systems  HENT: Negative for congestion.   Respiratory: Negative for cough and shortness of breath.   Cardiovascular: Negative for chest pain and leg swelling.  Endocrine: Positive for cold intolerance.  Musculoskeletal: Positive for back pain, arthralgias and gait problem.  Allergic/Immunologic: Positive for environmental allergies.  Neurological: Positive for weakness.    Past Medical History  Diagnosis Date  . Essential hypertension, benign   . Hyperlipidemia   . Depression   . Chronic pain   . GERD (gastroesophageal reflux disease)   . HTN (hypertension) 11/27/2015  . Stroke [facility])     Past Surgical History  Procedure Laterality Date  . Right  rotator cuff repair    . Closed reduction mandibular fracture w/ arch bars      + multiple extractions  . Removal foreign body right shoulder    . Condyloma resection    . Radiology with anesthesia N/A 11/18/2015    Procedure: RADIOLOGY WITH ANESTHESIA;  Surgeon: Luanne Bras, MD;  Location: [facility];  Service: Radiology;  Laterality: N/A;  . Tee without cardioversion N/A 11/21/2015    Procedure: TRANSESOPHAGEAL ECHOCARDIOGRAM (TEE);  Surgeon: Larey Dresser, MD;  Location: [facility] ENDOSCOPY;  Service: Cardiovascular;  Laterality: N/A;    Family History  Problem Relation Age of Onset  . Diabetes Mother   . Hypertension Mother   . Drug abuse Mother   . Hyperlipidemia Mother   . Diabetes Father   . Hypertension Father   . Hyperlipidemia Father   . Diabetes Brother   . Hypertension Brother     Social History Social History  Substance Use Topics  . Smoking status: Former Smoker -- 0.50 packs/day for 30 years    Types: Cigarettes    Quit date: 11/17/2015  . Smokeless tobacco: Never Used  . Alcohol Use: No    Allergies  Allergen Reactions  . Oxcarbazepine Other (See Comments)    Patient goes out of right state of mind.     Current Outpatient Prescriptions  Medication Sig Dispense Refill  . acetaminophen (TYLENOL) 500 MG tablet Take 2 tablets (1,000  mg total) by mouth every 8 (eight) hours as needed. 30 tablet 0  . ALPRAZolam (XANAX) 0.25 MG tablet Take 1 tablet (0.25 mg total) by mouth 2 (two) times daily as needed for anxiety (uncontrolled left arm movements). 15 tablet 0  . amLODipine (NORVASC) 10 MG tablet Take 1 tablet (10 mg total) by mouth daily. 30 tablet 0  . aspirin 325 MG tablet Take 1 tablet (325 mg total) by mouth daily. 100 tablet 0  . atorvastatin (LIPITOR) 40 MG tablet Take 1 tablet (40 mg total) by mouth daily at 6 PM. 30 tablet 0  . chlorthalidone (HYGROTON) 50 MG tablet Take 1 tablet (50 mg total) by mouth daily. 30 tablet 0  . citalopram (CELEXA) 10 MG  tablet Take 1 tablet (10 mg total) by mouth daily. 30 tablet 0  . cloNIDine (CATAPRES - DOSED IN MG/24 HR) 0.3 mg/24hr patch Place 1 patch (0.3 mg total) onto the skin once a week. Next change on monday 4 patch 12  . clotrimazole (LOTRIMIN) 1 % cream Apply topically 2 (two) times daily. 60 g 0  . folic acid (FOLVITE) 1 MG tablet Take 1 tablet (1 mg total) by mouth daily. 30 tablet 0  . hydrocerin (EUCERIN) CREA Apply 1 application topically 3 (three) times daily. Available over the counter  0  . labetalol (NORMODYNE) 200 MG tablet Take 1 tablet (200 mg total) by mouth 3 (three) times daily. 90 tablet 0  . Multiple Vitamin (MULTIVITAMIN WITH MINERALS) TABS tablet Take 1 tablet by mouth daily. 100 tablet 1  . nicotine (NICODERM CQ - DOSED IN MG/24 HR) 7 mg/24hr patch Place 2 patches (14 mg total) onto the skin daily. 30 patch 1  . pantoprazole (PROTONIX) 40 MG tablet Take 1 tablet (40 mg total) by mouth at bedtime. 30 tablet 0  . potassium chloride SA (K-DUR,KLOR-CON) 20 MEQ tablet Take 1 tablet (20 mEq total) by mouth 2 (two) times daily. 60 tablet 0  . sodium chloride (OCEAN) 0.65 % SOLN nasal spray Place 1 spray into both nostrils as needed for congestion.  0  . Solifenacin Succinate (VESICARE PO) Take by mouth.    Marland Kitchen tiZANidine (ZANAFLEX) 4 MG tablet Take 2 tablets (8 mg total) by mouth at bedtime. 60 tablet 1  . traZODone (DESYREL) 100 MG tablet Take 1 tablet (100 mg total) by mouth at bedtime. 30 tablet 1  . traMADol (ULTRAM) 50 MG tablet Take 1 tablet (50 mg total) by mouth every 6 (six) hours as needed. 60 tablet 3   No current facility-administered medications for this visit.     Physical Exam  Blood pressure 152/88, pulse 76, temperature 97.7 F (36.5 C), height 5\' 5"  (1.651 m), weight 203 lb (92.08 kg).  Constitutional: overall normal hygiene, normal nutrition, well developed, normal grooming, normal body habitus. Assistive device:wheelchair  Musculoskeletal: gait and station Limp  left , muscle tone and strength are normal on the right, weakness on the left, no tremors or atrophy is present on the right, he has atrophy on the left.  .  Neurological: coordination overall normal.  Deep tendon reflex/nerve stretch intact.  Sensation normal.  Cranial nerves II-XII intact. He has left sided weakness  Skin:   Scar over the right anterior shoulder, otherwise overall no scars, lesions, ulcers or rashes. No psoriasis.  Psychiatric: Alert and oriented x 3.  Recent memory intact, remote memory unclear.  Normal mood and affect. Well groomed.  Good eye contact.  Cardiovascular: overall no swelling, no  varicosities, no edema bilaterally, normal temperatures of the legs and arms, no clubbing, cyanosis and good capillary refill.  Lymphatic: palpation is normal.  Examination of right Upper Extremity is done.  Inspection:   Overall:  Elbow non-tender without crepitus or defects, forearm non-tender without crepitus or defects, wrist non-tender without crepitus or defects, hand non-tender.    Shoulder: with glenohumeral joint tenderness, without effusion.   Upper arm: without swelling and tenderness   Range of motion:   Overall:  Full range of motion of the elbow, full range of motion of wrist and full range of motion in fingers.   Shoulder:  right  160 degrees forward flexion; 145 degrees abduction; 35 degrees internal rotation, 35 degrees external rotation, 15 degrees extension, 40 degrees adduction.   Stability:   Overall:  Shoulder, elbow and wrist stable   Strength and Tone:   Overall full shoulder muscles strength, full upper arm strength and normal upper arm bulk and tone.   The patient has been educated about the nature of the problem(s) and counseled on treatment options.  The patient appeared to understand what I have discussed and is in agreement with it.  Encounter Diagnoses  Name Primary?  . Right shoulder pain Yes  . Essential hypertension   . Hemiparesis  affecting left side as late effect of stroke ([place])    PROCEDURE NOTE:  The patient request injection, verbal consent was obtained.  The right shoulder was prepped appropriately after time out was performed.   Sterile technique was observed and injection of 1 cc of Depo-Medrol 40 mg with several cc's of plain xylocaine. Anesthesia was provided by ethyl chloride and a 20-gauge needle was used to inject the shoulder area. A posterior approach was used.  The injection was tolerated well.  A band aid dressing was applied.  The patient was advised to apply ice later today and tomorrow to the injection sight as needed.  PLAN Call if any problems.  Precautions discussed.  Continue current medications.   Return to clinic 1 month

## 2016-05-02 ENCOUNTER — Telehealth: Payer: Self-pay | Admitting: Orthopaedic Surgery

## 2016-05-02 NOTE — Telephone Encounter (Signed)
Take the Tramadol.

## 2016-05-02 NOTE — Telephone Encounter (Signed)
Patient called with question about medication prescribed at 04/23/16 office visit: traMADol (ULTRAM) 50 MG tablet [id number] - states "not helping much," and requests to speak with Dr Luna Glasgow.  I relayed that Dr Luna Glasgow prescribes the appropriate medication, and will relay the information for Doctor to review and advise.  [name] cell ph# is [phone]

## 2016-05-02 NOTE — Telephone Encounter (Signed)
Called back to patient and relayed.

## 2016-05-13 ENCOUNTER — Encounter: Payer: Self-pay | Admitting: Physical Medicine & Rehabilitation

## 2016-05-13 ENCOUNTER — Ambulatory Visit (HOSPITAL_BASED_OUTPATIENT_CLINIC_OR_DEPARTMENT_OTHER): Payer: Medicaid Other | Admitting: Physical Medicine & Rehabilitation

## 2016-05-13 ENCOUNTER — Encounter: Payer: Medicaid Other | Attending: Physical Medicine & Rehabilitation

## 2016-05-13 VITALS — BP 133/68 | HR 75 | Resp 14

## 2016-05-13 DIAGNOSIS — F329 Major depressive disorder, single episode, unspecified: Secondary | ICD-10-CM | POA: Diagnosis not present

## 2016-05-13 DIAGNOSIS — G8114 Spastic hemiplegia affecting left nondominant side: Secondary | ICD-10-CM | POA: Diagnosis not present

## 2016-05-13 DIAGNOSIS — E785 Hyperlipidemia, unspecified: Secondary | ICD-10-CM | POA: Diagnosis not present

## 2016-05-13 DIAGNOSIS — G8929 Other chronic pain: Secondary | ICD-10-CM | POA: Diagnosis present

## 2016-05-13 DIAGNOSIS — K219 Gastro-esophageal reflux disease without esophagitis: Secondary | ICD-10-CM | POA: Diagnosis not present

## 2016-05-13 DIAGNOSIS — I69354 Hemiplegia and hemiparesis following cerebral infarction affecting left non-dominant side: Secondary | ICD-10-CM | POA: Diagnosis not present

## 2016-05-13 DIAGNOSIS — I69319 Unspecified symptoms and signs involving cognitive functions following cerebral infarction: Secondary | ICD-10-CM | POA: Insufficient documentation

## 2016-05-13 DIAGNOSIS — I10 Essential (primary) hypertension: Secondary | ICD-10-CM | POA: Diagnosis not present

## 2016-05-13 DIAGNOSIS — I63521 Cerebral infarction due to unspecified occlusion or stenosis of right anterior cerebral artery: Secondary | ICD-10-CM | POA: Diagnosis not present

## 2016-05-13 NOTE — Progress Notes (Signed)
Subjective:    Patient ID: Taylor Obrien, male    DOB: [birth year], 50 y.o.   MRN: [id number]  HPI  Patient still has occasional clonus at the left ankle. He complains of weakness in the left leg as well as decreased coordination. Still has some spasms during the day. The Zanaflex helps at night but it makes him too drowsy during the day. The patient has some right shoulder pain and history of rotator cuff surgery and has followed up with orthopedics on this. Received an injection last month  Tramadol causing stomach upset,, discussed recommendations not to use nonsteroidal anti-inflammatories. He can use Tylenol Pain Inventory Average Pain 8 Pain Right Now 8 My pain is sharp, burning and aching  In the last 24 hours, has pain interfered with the following? General activity 8 Relation with others 1 Enjoyment of life 5 What TIME of day is your pain at its worst? evening Sleep (in general) Poor  Pain is worse with: walking and sitting Pain improves with: rest and medication Relief from Meds: 0  Mobility walk with assistance how many minutes can you walk? 2 do you drive?  yes use a wheelchair needs help with transfers transfers alone Do you have any goals in this area?  yes  Function not employed: date last employed . I need assistance with the following:  dressing, toileting, meal prep and household duties  Neuro/Psych bladder control problems weakness numbness tingling trouble walking spasms dizziness confusion depression anxiety  Prior Studies Any changes since last visit?  no  Physicians involved in your care Any changes since last visit?  no   Family History  Problem Relation Age of Onset  . Diabetes Mother   . Hypertension Mother   . Drug abuse Mother   . Hyperlipidemia Mother   . Diabetes Father   . Hypertension Father   . Hyperlipidemia Father   . Diabetes Brother   . Hypertension Brother    Social History   Social History  . Marital  Status: Married    Spouse Name: N/A  . Number of Children: N/A  . Years of Education: N/A   Social History Main Topics  . Smoking status: Former Smoker -- 0.50 packs/day for 30 years    Types: Cigarettes    Quit date: 11/17/2015  . Smokeless tobacco: Never Used  . Alcohol Use: No  . Drug Use: Yes    Special: Marijuana  . Sexual Activity: Yes    Birth Control/ Protection: None   Other Topics Concern  . None   Social History Narrative   Past Surgical History  Procedure Laterality Date  . Right rotator cuff repair    . Closed reduction mandibular fracture w/ arch bars      + multiple extractions  . Removal foreign body right shoulder    . Condyloma resection    . Radiology with anesthesia N/A 11/18/2015    Procedure: RADIOLOGY WITH ANESTHESIA;  Surgeon: Luanne Bras, MD;  Location: [facility];  Service: Radiology;  Laterality: N/A;  . Tee without cardioversion N/A 11/21/2015    Procedure: TRANSESOPHAGEAL ECHOCARDIOGRAM (TEE);  Surgeon: Larey Dresser, MD;  Location: [facility] ENDOSCOPY;  Service: Cardiovascular;  Laterality: N/A;   Past Medical History  Diagnosis Date  . Essential hypertension, benign   . Hyperlipidemia   . Depression   . Chronic pain   . GERD (gastroesophageal reflux disease)   . HTN (hypertension) 11/27/2015  . Stroke ([facility])    BP 133/68 mmHg  Pulse  75  Resp 14  SpO2 96%  Opioid Risk Score:   Fall Risk Score:  `1  Depression screen PHQ 2/9  Depression screen PHQ 2/9 01/19/2016  Decreased Interest 0  Down, Depressed, Hopeless 0  PHQ - 2 Score 0     Review of Systems  Constitutional: Positive for unexpected weight change.  Endocrine:       High blood sugar   All other systems reviewed and are negative.      Objective:   Physical Exam  Constitutional: He is oriented to person, place, and time. He appears well-developed and well-nourished.  HENT:  Head: Normocephalic and atraumatic.  Eyes: Conjunctivae and EOM are normal. Pupils are equal,  round, and reactive to light.  Neck: Normal range of motion.  Neurological: He is alert and oriented to person, place, and time.  Motor strength is 4 minus in the left deltoid, bicep, tricep, grip 3 minus left hip flexor and knee extensor trace ankle dorsiflexor plantar flexor He has 2 beats of clonus at the left ankle. He has Ashworth grade 3 rigidity at the quadriceps and 2 at the hamstring.  Psychiatric: He has a normal mood and affect.  Nursing note and vitals reviewed.  Decreased sensation left upper and left lower limb, correctly identifies  Light touch but  Has to think about it longer.         Assessment & Plan:  1. 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 He has problems with spasticity in left lower extremity. He is limited by the side effects of Zanaflex causing sedation. He is able to take it at night only. We discussed other treatment options we discussed that the tibial nerve block with phenol only works for spasticity from the knee down. His left thigh spasticity can be addressed with Botox would recommend the following dosing Rectus femoris 50 units, VMO 50 units, hamstring 100 units

## 2016-05-13 NOTE — Patient Instructions (Signed)
May discontinue tramadol, may take Tylenol for pain up to 4000 mg per day

## 2016-05-21 ENCOUNTER — Encounter: Payer: Self-pay | Admitting: Orthopaedic Surgery

## 2016-05-21 ENCOUNTER — Ambulatory Visit (INDEPENDENT_AMBULATORY_CARE_PROVIDER_SITE_OTHER): Payer: Medicaid Other | Admitting: Orthopaedic Surgery

## 2016-05-21 VITALS — BP 162/100 | HR 84 | Temp 98.1°F | Resp 16 | Ht 65.0 in | Wt 220.0 lb

## 2016-05-21 DIAGNOSIS — I69354 Hemiplegia and hemiparesis following cerebral infarction affecting left non-dominant side: Secondary | ICD-10-CM

## 2016-05-21 DIAGNOSIS — M25511 Pain in right shoulder: Secondary | ICD-10-CM

## 2016-05-21 DIAGNOSIS — I10 Essential (primary) hypertension: Secondary | ICD-10-CM

## 2016-05-21 NOTE — Progress Notes (Signed)
CC:  My right shoulder is still hurting.  He had good results from the injection to the right shoulder last time.  His pain has returned over the last week.  He would like another injection today.  The right shoulder has no effusion.  ROM is tender.  Encounter Diagnoses  Name Primary?  . Right shoulder pain Yes  . Essential hypertension   . Hemiparesis affecting left side as late effect of stroke ([place])     PROCEDURE NOTE:  The patient request injection, verbal consent was obtained.  The right shoulder was prepped appropriately after time out was performed.   Sterile technique was observed and injection of 1 cc of Depo-Medrol 40 mg with several cc's of plain xylocaine. Anesthesia was provided by ethyl chloride and a 20-gauge needle was used to inject the shoulder area. A posterior approach was used.  The injection was tolerated well.  A band aid dressing was applied.  The patient was advised to apply ice later today and tomorrow to the injection sight as needed.  Return in one week.  Continue present medicine.  Call if any problem.  Electronically Signed Sanjuana Kava, MD 6/13/20171:43 PM

## 2016-05-27 ENCOUNTER — Ambulatory Visit: Payer: Medicaid Other | Admitting: Physical Medicine & Rehabilitation

## 2016-06-03 ENCOUNTER — Ambulatory Visit (HOSPITAL_BASED_OUTPATIENT_CLINIC_OR_DEPARTMENT_OTHER): Payer: Medicaid Other | Admitting: Physical Medicine & Rehabilitation

## 2016-06-03 ENCOUNTER — Encounter: Payer: Self-pay | Admitting: Physical Medicine & Rehabilitation

## 2016-06-03 VITALS — BP 133/84 | HR 74 | Resp 14

## 2016-06-03 DIAGNOSIS — G8114 Spastic hemiplegia affecting left nondominant side: Secondary | ICD-10-CM | POA: Diagnosis not present

## 2016-06-03 DIAGNOSIS — G8110 Spastic hemiplegia affecting unspecified side: Secondary | ICD-10-CM | POA: Insufficient documentation

## 2016-06-03 NOTE — Progress Notes (Signed)
  Botox Injection for spasticity using needle EMG guidance  Dilution: 50 Units/ml Indication: Severe spasticity which interferes with ADL,mobility and/or  hygiene and is unresponsive to medication management and other conservative care Informed consent was obtained after describing risks and benefits of the procedure with the patient. This includes bleeding, bruising, infection, excessive weakness, or medication side effects. A REMS form is on file and signed. Needle: 25g 2" needle electrode Number of units per muscle Left VMO 50 units Left rectus femoris 50 units Hamstrings100 , 50 medial, 50 lateral All injections were done after obtaining appropriate EMG activity and after negative drawback for blood. The patient tolerated the procedure well. Post procedure instructions were given. A followup appointment was made.

## 2016-06-03 NOTE — Patient Instructions (Signed)

## 2016-06-13 ENCOUNTER — Other Ambulatory Visit: Payer: Self-pay | Admitting: Physical Medicine & Rehabilitation

## 2016-06-18 ENCOUNTER — Encounter: Payer: Self-pay | Admitting: Orthopaedic Surgery

## 2016-06-18 ENCOUNTER — Ambulatory Visit (INDEPENDENT_AMBULATORY_CARE_PROVIDER_SITE_OTHER): Payer: Medicaid Other | Admitting: Orthopaedic Surgery

## 2016-06-18 VITALS — BP 161/115 | HR 83 | Temp 97.5°F | Resp 16

## 2016-06-18 DIAGNOSIS — M25511 Pain in right shoulder: Secondary | ICD-10-CM

## 2016-06-18 DIAGNOSIS — I10 Essential (primary) hypertension: Secondary | ICD-10-CM

## 2016-06-18 MED ORDER — HYDROCODONE-ACETAMINOPHEN 5-325 MG PO TABS: 1.0000 | ORAL_TABLET | ORAL | Status: DC | PRN

## 2016-06-18 NOTE — Progress Notes (Signed)
Patient [id number] Taylor Obrien, male DOB:[birth year], 50 y.o. [id number]  Chief Complaint  Patient presents with  . Follow-up    shoulder pain    HPI  Taylor Obrien is a 50 y.o. male who has continued right shoulder pain.  The injection last time did not help. He has no new trauma, no paresthesias.  He is taking his medicine.  He is in a wheelchair and has to use his arm to propel it.  HPI  There is no weight on file to calculate BMI.  ROS  Review of Systems  HENT: Negative for congestion.   Respiratory: Negative for cough and shortness of breath.   Cardiovascular: Negative for chest pain and leg swelling.  Endocrine: Positive for cold intolerance.  Musculoskeletal: Positive for back pain, arthralgias and gait problem.  Allergic/Immunologic: Positive for environmental allergies.  Neurological: Positive for weakness.    Past Medical History  Diagnosis Date  . Essential hypertension, benign   . Hyperlipidemia   . Depression   . Chronic pain   . GERD (gastroesophageal reflux disease)   . HTN (hypertension) 11/27/2015  . Stroke [facility])     Past Surgical History  Procedure Laterality Date  . Right rotator cuff repair    . Closed reduction mandibular fracture w/ arch bars      + multiple extractions  . Removal foreign body right shoulder    . Condyloma resection    . Radiology with anesthesia N/A 11/18/2015    Procedure: RADIOLOGY WITH ANESTHESIA;  Surgeon: Luanne Bras, MD;  Location: [place];  Service: Radiology;  Laterality: N/A;  . Tee without cardioversion N/A 11/21/2015    Procedure: TRANSESOPHAGEAL ECHOCARDIOGRAM (TEE);  Surgeon: Larey Dresser, MD;  Location: [facility] ENDOSCOPY;  Service: Cardiovascular;  Laterality: N/A;    Family History  Problem Relation Age of Onset  . Diabetes Mother   . Hypertension Mother   . Drug abuse Mother   . Hyperlipidemia Mother   . Diabetes Father   . Hypertension Father   . Hyperlipidemia Father   . Diabetes Brother   .  Hypertension Brother     Social History Social History  Substance Use Topics  . Smoking status: Former Smoker -- 0.50 packs/day for 30 years    Types: Cigarettes    Quit date: 11/17/2015  . Smokeless tobacco: Never Used  . Alcohol Use: No    Allergies  Allergen Reactions  . Oxcarbazepine Other (See Comments)    Patient goes out of right state of mind.     Current Outpatient Prescriptions  Medication Sig Dispense Refill  . acetaminophen (TYLENOL) 500 MG tablet Take 2 tablets (1,000 mg total) by mouth every 8 (eight) hours as needed. 30 tablet 0  . ALPRAZolam (XANAX) 0.25 MG tablet Take 1 tablet (0.25 mg total) by mouth 2 (two) times daily as needed for anxiety (uncontrolled left arm movements). 15 tablet 0  . amLODipine (NORVASC) 10 MG tablet Take 1 tablet (10 mg total) by mouth daily. 30 tablet 0  . aspirin 325 MG tablet Take 1 tablet (325 mg total) by mouth daily. 100 tablet 0  . atorvastatin (LIPITOR) 40 MG tablet Take 1 tablet (40 mg total) by mouth daily at 6 PM. 30 tablet 0  . chlorthalidone (HYGROTON) 50 MG tablet Take 1 tablet (50 mg total) by mouth daily. 30 tablet 0  . citalopram (CELEXA) 10 MG tablet Take 1 tablet (10 mg total) by mouth daily. 30 tablet 0  . cloNIDine (CATAPRES - DOSED IN  MG/24 HR) 0.3 mg/24hr patch Place 1 patch (0.3 mg total) onto the skin once a week. Next change on monday 4 patch 12  . clotrimazole (LOTRIMIN) 1 % cream Apply topically 2 (two) times daily. 60 g 0  . folic acid (FOLVITE) 1 MG tablet Take 1 tablet (1 mg total) by mouth daily. 30 tablet 0  . hydrocerin (EUCERIN) CREA Apply 1 application topically 3 (three) times daily. Available over the counter  0  . labetalol (NORMODYNE) 200 MG tablet Take 1 tablet (200 mg total) by mouth 3 (three) times daily. 90 tablet 0  . Multiple Vitamin (MULTIVITAMIN WITH MINERALS) TABS tablet Take 1 tablet by mouth daily. 100 tablet 1  . nicotine (NICODERM CQ - DOSED IN MG/24 HR) 7 mg/24hr patch Place 2 patches  (14 mg total) onto the skin daily. 30 patch 1  . pantoprazole (PROTONIX) 40 MG tablet Take 1 tablet (40 mg total) by mouth at bedtime. 30 tablet 0  . potassium chloride SA (K-DUR,KLOR-CON) 20 MEQ tablet Take 1 tablet (20 mEq total) by mouth 2 (two) times daily. 60 tablet 0  . sodium chloride (OCEAN) 0.65 % SOLN nasal spray Place 1 spray into both nostrils as needed for congestion.  0  . Solifenacin Succinate (VESICARE PO) Take by mouth.    Marland Kitchen tiZANidine (ZANAFLEX) 4 MG tablet TAKE (2) TABLETS BY MOUTH AT BEDTIME. 60 tablet 0  . traZODone (DESYREL) 100 MG tablet Take 1 tablet (100 mg total) by mouth at bedtime. 30 tablet 1  . HYDROcodone-acetaminophen (NORCO/VICODIN) 5-325 MG tablet Take 1 tablet by mouth every 4 (four) hours as needed for moderate pain (Must last 15 days.Do not take and drive a car or use machinery.). 60 tablet 0   No current facility-administered medications for this visit.     Physical Exam  Blood pressure 161/115, pulse 83, temperature 97.5 F (36.4 C), resp. rate 16.  Constitutional: overall normal hygiene, normal nutrition, well developed, normal grooming, normal body habitus. Assistive device: in wheelchair  Musculoskeletal: gait and station Limp in wheelchair, muscle tone and strength are normal, no tremors or atrophy is present.  .  Neurological: coordination overall normal.  Deep tendon reflex/nerve stretch intact.  Sensation normal.  Cranial nerves II-XII intact.   Skin:   normal overall no scars, lesions, ulcers or rashes. No psoriasis.  Psychiatric: Alert and oriented x 3.  Recent memory intact, remote memory unclear.  Normal mood and affect. Well groomed.  Good eye contact.  Cardiovascular: overall no swelling, no varicosities, no edema bilaterally, normal temperatures of the legs and arms, no clubbing, cyanosis and good capillary refill.  Lymphatic: palpation is normal.  Examination of right Upper Extremity is done.  Inspection:   Overall:  Elbow  non-tender without crepitus or defects, forearm non-tender without crepitus or defects, wrist non-tender without crepitus or defects, hand non-tender.    Shoulder: with glenohumeral joint tenderness, without effusion.   Upper arm: without swelling and tenderness   Range of motion:   Overall:  Full range of motion of the elbow, full range of motion of wrist and full range of motion in fingers.   Shoulder:  right  165 degrees forward flexion; 145 degrees abduction; 30 degrees internal rotation, 30 degrees external rotation, 15 degrees extension, 40 degrees adduction.   Stability:   Overall:  Shoulder, elbow and wrist stable   Strength and Tone:   Overall full shoulder muscles strength, full upper arm strength and normal upper arm bulk and tone.  The patient has been educated about the nature of the problem(s) and counseled on treatment options.  The patient appeared to understand what I have discussed and is in agreement with it.  Encounter Diagnoses  Name Primary?  . Right shoulder pain Yes  . Essential hypertension     PLAN Call if any problems.  Precautions discussed.  Continue current medications.   Return to clinic 1 month   Electronically Signed Sanjuana Kava, MD 7/11/20179:07 AM

## 2016-07-03 ENCOUNTER — Telehealth: Payer: Self-pay | Admitting: Orthopaedic Surgery

## 2016-07-03 MED ORDER — HYDROCODONE-ACETAMINOPHEN 5-325 MG PO TABS: 1.0000 | ORAL_TABLET | ORAL | 0 refills | Status: DC | PRN

## 2016-07-03 NOTE — Telephone Encounter (Signed)
Hydrocodone-Acetaminophen 5/325mg  Qty 60 Tablets

## 2016-07-15 ENCOUNTER — Encounter: Payer: Medicaid Other | Attending: Physical Medicine & Rehabilitation

## 2016-07-15 ENCOUNTER — Ambulatory Visit: Payer: Self-pay | Admitting: Physical Medicine & Rehabilitation

## 2016-07-15 DIAGNOSIS — G8929 Other chronic pain: Secondary | ICD-10-CM | POA: Insufficient documentation

## 2016-07-15 DIAGNOSIS — K219 Gastro-esophageal reflux disease without esophagitis: Secondary | ICD-10-CM | POA: Insufficient documentation

## 2016-07-15 DIAGNOSIS — I10 Essential (primary) hypertension: Secondary | ICD-10-CM | POA: Insufficient documentation

## 2016-07-15 DIAGNOSIS — F329 Major depressive disorder, single episode, unspecified: Secondary | ICD-10-CM | POA: Insufficient documentation

## 2016-07-15 DIAGNOSIS — E785 Hyperlipidemia, unspecified: Secondary | ICD-10-CM | POA: Insufficient documentation

## 2016-07-15 DIAGNOSIS — G8114 Spastic hemiplegia affecting left nondominant side: Secondary | ICD-10-CM | POA: Insufficient documentation

## 2016-07-15 DIAGNOSIS — I69319 Unspecified symptoms and signs involving cognitive functions following cerebral infarction: Secondary | ICD-10-CM | POA: Insufficient documentation

## 2016-07-17 ENCOUNTER — Telehealth: Payer: Self-pay | Admitting: Orthopaedic Surgery

## 2016-07-17 MED ORDER — HYDROCODONE-ACETAMINOPHEN 5-325 MG PO TABS: 1.0000 | ORAL_TABLET | ORAL | 0 refills | Status: DC | PRN

## 2016-07-17 NOTE — Telephone Encounter (Signed)
Hydrocodone-Acetaminophen 5/325mg   Qty 60 Tablets

## 2016-07-18 ENCOUNTER — Ambulatory Visit: Payer: Medicaid Other | Admitting: Orthopaedic Surgery

## 2016-07-25 ENCOUNTER — Encounter: Payer: Self-pay | Admitting: Orthopaedic Surgery

## 2016-07-30 ENCOUNTER — Encounter: Payer: Self-pay | Admitting: Orthopaedic Surgery

## 2016-07-30 ENCOUNTER — Ambulatory Visit (INDEPENDENT_AMBULATORY_CARE_PROVIDER_SITE_OTHER): Payer: Medicaid Other | Admitting: Orthopaedic Surgery

## 2016-07-30 VITALS — BP 140/91 | HR 78 | Temp 97.9°F

## 2016-07-30 DIAGNOSIS — I10 Essential (primary) hypertension: Secondary | ICD-10-CM

## 2016-07-30 DIAGNOSIS — M25511 Pain in right shoulder: Secondary | ICD-10-CM | POA: Diagnosis not present

## 2016-07-30 DIAGNOSIS — I69354 Hemiplegia and hemiparesis following cerebral infarction affecting left non-dominant side: Secondary | ICD-10-CM

## 2016-07-30 MED ORDER — HYDROCODONE-ACETAMINOPHEN 5-325 MG PO TABS: 1.0000 | ORAL_TABLET | ORAL | 0 refills | Status: DC | PRN

## 2016-07-30 NOTE — Progress Notes (Signed)
Patient Taylor Obrien Taylor Obrien, male DOB:07/21/1966, 50 y.o. FU:[id number]  Chief Complaint  Patient presents with  . Follow-up    Right shoulder    HPI  [name] is a 50 y.o. male who has chronic right shoulder pain.  He has been doing his exercises.  He is also in a wheelchair manual because of recent CVA and difficulty walking.  The use of the wheelchair has helped his right shoulder strength and motion.  He has no new trauma, no paresthesias. HPI  There is no height or weight on file to calculate BMI.  ROS  Review of Systems  HENT: Negative for congestion.   Respiratory: Negative for cough and shortness of breath.   Cardiovascular: Negative for chest pain and leg swelling.  Endocrine: Positive for cold intolerance.  Musculoskeletal: Positive for arthralgias, back pain and gait problem.  Allergic/Immunologic: Positive for environmental allergies.  Neurological: Positive for weakness.    Past Medical History:  Diagnosis Date  . Chronic pain   . Depression   . Essential hypertension, benign   . GERD (gastroesophageal reflux disease)   . HTN (hypertension) 11/27/2015  . Hyperlipidemia   . Stroke [facility])     Past Surgical History:  Procedure Laterality Date  . CLOSED REDUCTION MANDIBULAR FRACTURE W/ ARCH BARS     + multiple extractions  . Condyloma resection    . RADIOLOGY WITH ANESTHESIA N/A 11/18/2015   Procedure: RADIOLOGY WITH ANESTHESIA;  Surgeon: Luanne Bras, MD;  Location: [place];  Service: Radiology;  Laterality: N/A;  . Removal foreign body right shoulder    . Right rotator cuff repair    . TEE WITHOUT CARDIOVERSION N/A 11/21/2015   Procedure: TRANSESOPHAGEAL ECHOCARDIOGRAM (TEE);  Surgeon: Larey Dresser, MD;  Location: [facility] ENDOSCOPY;  Service: Cardiovascular;  Laterality: N/A;    Family History  Problem Relation Age of Onset  . Diabetes Mother   . Hypertension Mother   . Drug abuse Mother   . Hyperlipidemia Mother   . Diabetes Father   .  Hypertension Father   . Hyperlipidemia Father   . Diabetes Brother   . Hypertension Brother     Social History Social History  Substance Use Topics  . Smoking status: Former Smoker    Packs/day: 0.50    Years: 30.00    Types: Cigarettes    Quit date: 11/17/2015  . Smokeless tobacco: Never Used  . Alcohol use No    Allergies  Allergen Reactions  . Oxcarbazepine Other (See Comments)    Patient goes out of right state of mind.     Current Outpatient Prescriptions  Medication Sig Dispense Refill  . acetaminophen (TYLENOL) 500 MG tablet Take 2 tablets (1,000 mg total) by mouth every 8 (eight) hours as needed. 30 tablet 0  . ALPRAZolam (XANAX) 0.25 MG tablet Take 1 tablet (0.25 mg total) by mouth 2 (two) times daily as needed for anxiety (uncontrolled left arm movements). 15 tablet 0  . amLODipine (NORVASC) 10 MG tablet Take 1 tablet (10 mg total) by mouth daily. 30 tablet 0  . aspirin 325 MG tablet Take 1 tablet (325 mg total) by mouth daily. 100 tablet 0  . atorvastatin (LIPITOR) 40 MG tablet Take 1 tablet (40 mg total) by mouth daily at 6 PM. 30 tablet 0  . chlorthalidone (HYGROTON) 50 MG tablet Take 1 tablet (50 mg total) by mouth daily. 30 tablet 0  . citalopram (CELEXA) 10 MG tablet Take 1 tablet (10 mg total) by mouth daily.  30 tablet 0  . cloNIDine (CATAPRES - DOSED IN MG/24 HR) 0.3 mg/24hr patch Place 1 patch (0.3 mg total) onto the skin once a week. Next change on monday 4 patch 12  . clotrimazole (LOTRIMIN) 1 % cream Apply topically 2 (two) times daily. 60 g 0  . folic acid (FOLVITE) 1 MG tablet Take 1 tablet (1 mg total) by mouth daily. 30 tablet 0  . hydrocerin (EUCERIN) CREA Apply 1 application topically 3 (three) times daily. Available over the counter  0  . HYDROcodone-acetaminophen (NORCO/VICODIN) 5-325 MG tablet Take 1 tablet by mouth every 4 (four) hours as needed for moderate pain (Must last 15 days.Do not take and drive a car or use machinery.). 50 tablet 0  .  labetalol (NORMODYNE) 200 MG tablet Take 1 tablet (200 mg total) by mouth 3 (three) times daily. 90 tablet 0  . Multiple Vitamin (MULTIVITAMIN WITH MINERALS) TABS tablet Take 1 tablet by mouth daily. 100 tablet 1  . nicotine (NICODERM CQ - DOSED IN MG/24 HR) 7 mg/24hr patch Place 2 patches (14 mg total) onto the skin daily. 30 patch 1  . pantoprazole (PROTONIX) 40 MG tablet Take 1 tablet (40 mg total) by mouth at bedtime. 30 tablet 0  . potassium chloride SA (K-DUR,KLOR-CON) 20 MEQ tablet Take 1 tablet (20 mEq total) by mouth 2 (two) times daily. 60 tablet 0  . sodium chloride (OCEAN) 0.65 % SOLN nasal spray Place 1 spray into both nostrils as needed for congestion.  0  . Solifenacin Succinate (VESICARE PO) Take by mouth.    Marland Kitchen tiZANidine (ZANAFLEX) 4 MG tablet TAKE (2) TABLETS BY MOUTH AT BEDTIME. 60 tablet 0  . traZODone (DESYREL) 100 MG tablet Take 1 tablet (100 mg total) by mouth at bedtime. 30 tablet 1   No current facility-administered medications for this visit.      Physical Exam  Blood pressure (!) 140/91, pulse 78, temperature 97.9 F (36.6 C).  Constitutional: overall normal hygiene, normal nutrition, well developed, normal grooming, normal body habitus. Assistive device:wheelchair  Musculoskeletal: gait and station Limp unable to walk secondary to CVA, muscle tone and strength are normal upper extremities, lower extremities with weakness and atrophy, no tremors is present.  .  Neurological: coordination overall normal.  Deep tendon reflex/nerve stretch intact.  Sensation normal.  Cranial nerves II-XII intact.   Skin:   normal overall no scars, lesions, ulcers or rashes. No psoriasis.  Psychiatric: Alert and oriented x 3.  Recent memory intact, remote memory unclear.  Normal mood and affect. Well groomed.  Good eye contact.  Cardiovascular: overall no swelling, no varicosities, no edema bilaterally, normal temperatures of the legs and arms, no clubbing, cyanosis and good  capillary refill.  Lymphatic: palpation is normal.  Examination of right Upper Extremity is done.  Inspection:   Overall:  Elbow non-tender without crepitus or defects, forearm non-tender without crepitus or defects, wrist non-tender without crepitus or defects, hand non-tender.    Shoulder: with glenohumeral joint tenderness, without effusion.   Upper arm: without swelling and tenderness   Range of motion:   Overall:  Full range of motion of the elbow, full range of motion of wrist and full range of motion in fingers.   Shoulder:  right  full degrees forward flexion; full degrees abduction; full degrees internal rotation, full degrees external rotation, full degrees extension, full degrees adduction.   Stability:   Overall:  Shoulder, elbow and wrist stable   Strength and Tone:   Overall  full shoulder muscles strength, full upper arm strength and normal upper arm bulk and tone.    The patient has been educated about the nature of the problem(s) and counseled on treatment options.  The patient appeared to understand what I have discussed and is in agreement with it.  No diagnosis found.  PLAN Call if any problems.  Precautions discussed.  Continue current medications.   Return to clinic 6 weeks   Electronically Signed Sanjuana Kava, MD 8/22/201710:27 AM

## 2016-08-02 ENCOUNTER — Ambulatory Visit (HOSPITAL_BASED_OUTPATIENT_CLINIC_OR_DEPARTMENT_OTHER): Payer: Medicaid Other | Admitting: Physical Medicine & Rehabilitation

## 2016-08-02 ENCOUNTER — Encounter: Payer: Self-pay | Admitting: Physical Medicine & Rehabilitation

## 2016-08-02 VITALS — BP 142/93 | HR 72

## 2016-08-02 DIAGNOSIS — K219 Gastro-esophageal reflux disease without esophagitis: Secondary | ICD-10-CM | POA: Diagnosis not present

## 2016-08-02 DIAGNOSIS — G8114 Spastic hemiplegia affecting left nondominant side: Secondary | ICD-10-CM | POA: Diagnosis present

## 2016-08-02 DIAGNOSIS — F329 Major depressive disorder, single episode, unspecified: Secondary | ICD-10-CM | POA: Diagnosis not present

## 2016-08-02 DIAGNOSIS — F17219 Nicotine dependence, cigarettes, with unspecified nicotine-induced disorders: Secondary | ICD-10-CM | POA: Diagnosis not present

## 2016-08-02 DIAGNOSIS — G8110 Spastic hemiplegia affecting unspecified side: Secondary | ICD-10-CM

## 2016-08-02 DIAGNOSIS — I10 Essential (primary) hypertension: Secondary | ICD-10-CM | POA: Diagnosis not present

## 2016-08-02 DIAGNOSIS — E785 Hyperlipidemia, unspecified: Secondary | ICD-10-CM | POA: Diagnosis not present

## 2016-08-02 DIAGNOSIS — G8929 Other chronic pain: Secondary | ICD-10-CM | POA: Diagnosis present

## 2016-08-02 DIAGNOSIS — I69319 Unspecified symptoms and signs involving cognitive functions following cerebral infarction: Secondary | ICD-10-CM | POA: Diagnosis present

## 2016-08-02 NOTE — Patient Instructions (Addendum)
Drink 8 oz of water prior to each meal  Next visit will be for botox, higher dose  Stop smoking  Lose weight  Join [employer] for aquatic therapy

## 2016-08-02 NOTE — Progress Notes (Signed)
Subjective:    Patient ID: Taylor Obrien, male    DOB: 1966-10-26, 50 y.o.   MRN: [id number]  HPI Walking with walker  But has fallen 4 times in 104months This freq is actually improving  Patient has unfortunately started smoking again. We once again discussed cessation as pertains to stroke risk. Patient has been gaining some weight. He is over 200 pounds and is only 5 5. The Botox was helpful for his leg spasms, but only partially. Still wakes up in the morning with his left leg shaking. He also complains that his urine smells very strong, but he doesn't really drink any water during the day. Pain Inventory Average Pain 8 Pain Right Now 8 My pain is sharp  In the last 24 hours, has pain interfered with the following? General activity 7 Relation with others 7 Enjoyment of life 7 What TIME of day is your pain at its worst? daytime and evening Sleep (in general) Poor  Pain is worse with: sitting and inactivity Pain improves with: heat/ice and TENS Relief from Meds: 5  Mobility walk with assistance use a walker how many minutes can you walk? 3 ability to climb steps?  no do you drive?  no use a wheelchair  Function not employed: date last employed . I need assistance with the following:  dressing, bathing and toileting  Neuro/Psych spasms confusion depression  Prior Studies Any changes since last visit?  no  Physicians involved in your care Any changes since last visit?  no   Family History  Problem Relation Age of Onset  . Diabetes Mother   . Hypertension Mother   . Drug abuse Mother   . Hyperlipidemia Mother   . Diabetes Father   . Hypertension Father   . Hyperlipidemia Father   . Diabetes Brother   . Hypertension Brother    Social History   Social History  . Marital status: Married    Spouse name: N/A  . Number of children: N/A  . Years of education: N/A   Social History Main Topics  . Smoking status: Former Smoker    Packs/day: 0.50   Years: 30.00    Types: Cigarettes    Quit date: 11/17/2015  . Smokeless tobacco: Never Used  . Alcohol use No  . Drug use:     Types: Marijuana  . Sexual activity: Yes    Birth control/ protection: None   Other Topics Concern  . None   Social History Narrative  . None   Past Surgical History:  Procedure Laterality Date  . CLOSED REDUCTION MANDIBULAR FRACTURE W/ ARCH BARS     + multiple extractions  . Condyloma resection    . RADIOLOGY WITH ANESTHESIA N/A 11/18/2015   Procedure: RADIOLOGY WITH ANESTHESIA;  Surgeon: Luanne Bras, MD;  Location: [place];  Service: Radiology;  Laterality: N/A;  . Removal foreign body right shoulder    . Right rotator cuff repair    . TEE WITHOUT CARDIOVERSION N/A 11/21/2015   Procedure: TRANSESOPHAGEAL ECHOCARDIOGRAM (TEE);  Surgeon: Larey Dresser, MD;  Location: [place];  Service: Cardiovascular;  Laterality: N/A;   Past Medical History:  Diagnosis Date  . Chronic pain   . Depression   . Essential hypertension, benign   . GERD (gastroesophageal reflux disease)   . HTN (hypertension) 11/27/2015  . Hyperlipidemia   . Stroke ([place])    BP (!) 142/93   Pulse 72   SpO2 91%   Opioid Risk Score:   Fall Risk  Score:  `1  Depression screen PHQ 2/9  Depression screen PHQ 2/9 01/19/2016  Decreased Interest 0  Down, Depressed, Hopeless 0  PHQ - 2 Score 0    Review of Systems  Constitutional: Negative.   HENT: Negative.   Eyes: Negative.   Respiratory: Negative.   Cardiovascular: Negative.   Gastrointestinal: Negative.   Endocrine: Negative.   Genitourinary: Negative.   Musculoskeletal: Positive for back pain.  Skin: Negative.   Neurological: Negative.   Hematological: Negative.   Psychiatric/Behavioral: Positive for confusion and dysphoric mood.       Objective:   Physical Exam  Constitutional: He is oriented to person, place, and time. He appears well-developed and well-nourished.  HENT:  Head: Normocephalic and  atraumatic.  Eyes: Conjunctivae and EOM are normal. Pupils are equal, round, and reactive to light.  Neck: Normal range of motion.  Neurological: He is alert and oriented to person, place, and time.  Psychiatric: He has a normal mood and affect.  Nursing note and vitals reviewed.   His motor strength is 4/5 in the left deltoid, biceps, triceps, grip 3/5 in the left hip flexor, knee extensor and 2 minus, ankle dorsiflexor, plantar flexor. Mood and affect are appropriate      Assessment & Plan:  1. 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 He has spasticity causing shaking of the right lower extremity, as is mainly activation of the quadriceps as well as hamstring. He would benefit from repeat Botox injection, but at a higher dose. I'm recommending 50 units to the VMO, 50 units to the rectus femoris, 25 units to the vastus intermedius, 25 units to the vastus lateralis 150 units to the hamstrings  We discussed no driving for now  We discussed smoking cessation  We discussed taking 8 ounces of water park to meals. This would help both with hydration also reduced appetite and promote  We discussed joining the [facility] in pursuing aquatic exercise with a light preserver

## 2016-08-07 DIAGNOSIS — Z139 Encounter for screening, unspecified: Secondary | ICD-10-CM

## 2016-08-07 NOTE — Congregational Nurse Program (Signed)
Congregational Nurse Program Note  Date of Encounter: 08/07/2016  Past Medical History: Past Medical History:  Diagnosis Date  . Chronic pain   . Depression   . Essential hypertension, benign   . GERD (gastroesophageal reflux disease)   . HTN (hypertension) 11/27/2015  . Hyperlipidemia   . Stroke [facility])     Encounter Details:     CNP Questionnaire - 08/07/16 1321      Patient Demographics   Is this a new or existing patient? New   Patient is considered a/an Not Applicable   Race African-American/Black     Patient Assistance   Location of Patient [place]   [name] financial/insurance status Low Income;Medicaid   Uninsured Patient No   Patient referred to apply for the following financial assistance Medicaid   Food insecurities addressed Not Applicable   Assistance securing medications No   Educational health offerings Behavioral health;Chronic disease;Hypertension;Navigating the healthcare system     Encounter Details   Primary purpose of visit Chronic Illness/Condition Visit;Education/Health Concerns;Family/Caregiver Support;Navigating the Healthcare System   Was an Emergency Department visit averted? No   Does patient have a medical provider? No  Client has several specialist, but is in need of PCP   Patient referred to Establish PCP;Private Practice   Was a mental health screening completed? (GAINS tool) No   Does patient have dental issues? No   Does patient have vision issues? No   Does your patient have an abnormal blood pressure today? No   Since previous encounter, have you referred patient for abnormal blood pressure that resulted in a new diagnosis or medication change? No   Does your patient have an abnormal blood glucose today? No   Since previous encounter, have you referred patient for abnormal blood glucose that resulted in a new diagnosis or medication change? No   Was there a life-saving intervention made? No      Client came  in today looking to connect with a Primary Care provider. He had been a patient of [facility] and was last seen in early June, before they closed. He currently is seeing specialist in [place], but he needs management also with a primary provider. He states he had been seen at a local practice , but does not want to return to the [facility].  PMH: CVA , Hypertension. Anxiety , sleep apnea, GERD as reported by client.  Plan: We discussed that the [place]: [place] is not a Primary Care center and the importance of having a medical provider. He and his wife state understanding. We attempted to connect him with a local MD in [place] that is accepting new Medicaid patients, however the office was closed. We did provide theed t contact number and office hours for that practice to the client and his wife. We also discussed that he could call [facility] in [place] to see if they would accept him there since he is familiar with them and they have access to his records. He preferred that and he and his wife will check into both options. We also discussed with client about calling [facility] for options of out of county transportation through possibly [place] or [facility]. Client and wife state they will make that call. He also had concerns regarding outstanding medical bills and we suggested contacting [employer] as well as following up with Medicaid to clarify his bills are being processed. Client and wife state understanding and state appreciation for  all the resources given. Client and wife instructed that they are welcome to come in anytime for a blood pressure check or assistance in resources for further needs.   Will follow for blood pressure checks and community resources only as needed.

## 2016-08-15 ENCOUNTER — Telehealth: Payer: Self-pay | Admitting: Orthopaedic Surgery

## 2016-08-15 MED ORDER — HYDROCODONE-ACETAMINOPHEN 5-325 MG PO TABS: 1.0000 | ORAL_TABLET | ORAL | 0 refills | Status: DC | PRN

## 2016-08-15 NOTE — Telephone Encounter (Signed)
Patient called for refill: HYDROcodone-acetaminophen (NORCO/VICODIN) 5-325 MG tablet  - patient's insurance is Medicaid.

## 2016-08-16 ENCOUNTER — Encounter: Payer: Self-pay | Admitting: Orthopaedic Surgery

## 2016-09-02 ENCOUNTER — Telehealth: Payer: Self-pay | Admitting: Orthopedic Surgery

## 2016-09-02 MED ORDER — HYDROCODONE-ACETAMINOPHEN 5-325 MG PO TABS: 1.0000 | ORAL_TABLET | Freq: Four times a day (QID) | ORAL | 0 refills | Status: DC | PRN

## 2016-09-02 NOTE — Telephone Encounter (Signed)
Patient requests a refill on Hydrocodone/Acetaminophen 5-325 mgs.  Qty 50  Sig: Take 1 tablet by mouth every 4 (four) hours as needed for moderate pain (Must last 15 days.Do not take and drive a car or use machinery.).          THIS PATIENT HAS MEDICAID

## 2016-09-06 ENCOUNTER — Encounter: Payer: Self-pay | Admitting: Physical Medicine & Rehabilitation

## 2016-09-06 ENCOUNTER — Encounter: Payer: Medicaid Other | Attending: Physical Medicine & Rehabilitation

## 2016-09-06 ENCOUNTER — Ambulatory Visit (HOSPITAL_BASED_OUTPATIENT_CLINIC_OR_DEPARTMENT_OTHER): Payer: Medicaid Other | Admitting: Physical Medicine & Rehabilitation

## 2016-09-06 VITALS — BP 142/92 | HR 85 | Resp 14

## 2016-09-06 DIAGNOSIS — G8114 Spastic hemiplegia affecting left nondominant side: Secondary | ICD-10-CM | POA: Insufficient documentation

## 2016-09-06 DIAGNOSIS — E785 Hyperlipidemia, unspecified: Secondary | ICD-10-CM | POA: Diagnosis not present

## 2016-09-06 DIAGNOSIS — K219 Gastro-esophageal reflux disease without esophagitis: Secondary | ICD-10-CM | POA: Diagnosis not present

## 2016-09-06 DIAGNOSIS — I10 Essential (primary) hypertension: Secondary | ICD-10-CM | POA: Insufficient documentation

## 2016-09-06 DIAGNOSIS — G8110 Spastic hemiplegia affecting unspecified side: Secondary | ICD-10-CM | POA: Diagnosis not present

## 2016-09-06 DIAGNOSIS — I69319 Unspecified symptoms and signs involving cognitive functions following cerebral infarction: Secondary | ICD-10-CM | POA: Diagnosis present

## 2016-09-06 DIAGNOSIS — F329 Major depressive disorder, single episode, unspecified: Secondary | ICD-10-CM | POA: Diagnosis not present

## 2016-09-06 DIAGNOSIS — G8929 Other chronic pain: Secondary | ICD-10-CM | POA: Insufficient documentation

## 2016-09-06 NOTE — Patient Instructions (Signed)
You received a Botox injection today. You may experience soreness at the needle injection sites. Please call us if any of the injection sites turns red after a couple days or if there is any drainage. You may experience muscle weakness as a result of Botox. This would improve with time but can take several weeks to improve. The Botox should start working in about one week. The Botox usually last 3 months. The injection can be repeated every 3 months as needed.  NO DRIVING

## 2016-09-06 NOTE — Progress Notes (Signed)
Botox Injection for spasticity using needle EMG guidance  Dilution: 50 Units/ml Indication: Severe spasticity which interferes with ADL,mobility and/or  hygiene and is unresponsive to medication management and other conservative care Informed consent was obtained after describing risks and benefits of the procedure with the patient. This includes bleeding, bruising, infection, excessive weakness, or medication side effects. A REMS form is on file and signed. Needle: 25g 2" needle electrode Number of units per muscle VMO 50 VastLat 25 Rectus fem 50 VastInt 25 Hamstrings150 All injections were done after obtaining appropriate EMG activity and after negative drawback for blood. The patient tolerated the procedure well. Post procedure instructions were given. A followup appointment was made.

## 2016-09-10 ENCOUNTER — Ambulatory Visit: Payer: Medicaid Other | Admitting: Orthopaedic Surgery

## 2016-09-17 ENCOUNTER — Encounter: Payer: Self-pay | Admitting: Orthopaedic Surgery

## 2016-09-17 ENCOUNTER — Ambulatory Visit (INDEPENDENT_AMBULATORY_CARE_PROVIDER_SITE_OTHER): Payer: Medicaid Other | Admitting: Orthopaedic Surgery

## 2016-09-17 VITALS — BP 138/88 | HR 86

## 2016-09-17 DIAGNOSIS — I69354 Hemiplegia and hemiparesis following cerebral infarction affecting left non-dominant side: Secondary | ICD-10-CM | POA: Diagnosis not present

## 2016-09-17 DIAGNOSIS — F17210 Nicotine dependence, cigarettes, uncomplicated: Secondary | ICD-10-CM | POA: Diagnosis not present

## 2016-09-17 DIAGNOSIS — G8929 Other chronic pain: Secondary | ICD-10-CM | POA: Diagnosis not present

## 2016-09-17 DIAGNOSIS — I10 Essential (primary) hypertension: Secondary | ICD-10-CM

## 2016-09-17 DIAGNOSIS — M25511 Pain in right shoulder: Secondary | ICD-10-CM

## 2016-09-17 MED ORDER — HYDROCODONE-ACETAMINOPHEN 5-325 MG PO TABS: 1.0000 | ORAL_TABLET | Freq: Four times a day (QID) | ORAL | 0 refills | Status: DC | PRN

## 2016-09-17 NOTE — Progress Notes (Signed)
Patient [id number] Taylor Obrien, male DOB:[birth year], 50 y.o. [id number]  Chief Complaint  Patient presents with  . Follow-up    Right Shoulder    HPI  Taylor Obrien is a 50 y.o. male who has chronic right shoulder pain.  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  His shoulder is stable.  He has more pain on cold rainy days.  He has no new trauma or paresthesias.  He is willing to go to [facility] for swim therapy.  He smokes, says he will not stop, that is the only thing left he enjoys.  He has tried the patches and they did not help. HPI  There is no height or weight on file to calculate BMI.  ROS  Review of Systems  HENT: Negative for congestion.   Respiratory: Negative for cough and shortness of breath.   Cardiovascular: Negative for chest pain and leg swelling.  Endocrine: Positive for cold intolerance.  Musculoskeletal: Positive for arthralgias, back pain and gait problem.  Allergic/Immunologic: Positive for environmental allergies.  Neurological: Positive for weakness.    Past Medical History:  Diagnosis Date  . Chronic pain   . Depression   . Essential hypertension, benign   . GERD (gastroesophageal reflux disease)   . HTN (hypertension) 11/27/2015  . Hyperlipidemia   . Stroke [facility])     Past Surgical History:  Procedure Laterality Date  . CLOSED REDUCTION MANDIBULAR FRACTURE W/ ARCH BARS     + multiple extractions  . Condyloma resection    . RADIOLOGY WITH ANESTHESIA N/A 11/18/2015   Procedure: RADIOLOGY WITH ANESTHESIA;  Surgeon: Luanne Bras, MD;  Location: [place];  Service: Radiology;  Laterality: N/A;  . Removal foreign body right shoulder    . Right rotator cuff repair    . TEE WITHOUT CARDIOVERSION N/A 11/21/2015   Procedure: TRANSESOPHAGEAL ECHOCARDIOGRAM (TEE);  Surgeon: Larey Dresser, MD;  [facility] END[facility]COPY;  Service: Cardiovascular;  Laterality: N/A;    Family History  Problem Relation Age of Onset  . Diabetes Mother   .  Hypertension Mother   . Drug abuse Mother   . Hyperlipidemia Mother   . Diabetes Father   . Hypertension Father   . Hyperlipidemia Father   . Diabetes Brother   . Hypertension Brother     Social History Social History  Substance Use Topics  . Smoking status: Former Smoker    Packs/day: 0.50    Years: 30.00    Types: Cigarettes    Quit date: 11/17/2015  . Smokeless tobacco: Never Used  . Alcohol use No    Allergies  Allergen Reactions  . Oxcarbazepine Other (See Comments)    Patient goes out of right state of mind.     Current Outpatient Prescriptions  Medication Sig Dispense Refill  . acetaminophen (TYLENOL) 500 MG tablet Take 2 tablets (1,000 mg total) by mouth every 8 (eight) hours as needed. 30 tablet 0  . ALPRAZolam (XANAX) 0.25 MG tablet Take 1 tablet (0.25 mg total) by mouth 2 (two) times daily as needed for anxiety (uncontrolled left arm movements). 15 tablet 0  . amLODipine (NORVASC) 10 MG tablet Take 1 tablet (10 mg total) by mouth daily. 30 tablet 0  . aspirin 325 MG tablet Take 1 tablet (325 mg total) by mouth daily. 100 tablet 0  . atorvastatin (LIPITOR) 40 MG tablet Take 1 tablet (40 mg total) by mouth daily at 6 PM. 30 tablet 0  . chlorthalidone (HYGROTON) 50 MG tablet  Take 1 tablet (50 mg total) by mouth daily. 30 tablet 0  . citalopram (CELEXA) 10 MG tablet Take 1 tablet (10 mg total) by mouth daily. 30 tablet 0  . cloNIDine (CATAPRES - DOSED IN MG/24 HR) 0.3 mg/24hr patch Place 1 patch (0.3 mg total) onto the skin once a week. Next change on monday 4 patch 12  . clotrimazole (LOTRIMIN) 1 % cream Apply topically 2 (two) times daily. 60 g 0  . folic acid (FOLVITE) 1 MG tablet Take 1 tablet (1 mg total) by mouth daily. 30 tablet 0  . hydrocerin (EUCERIN) CREA Apply 1 application topically 3 (three) times daily. Available over the counter  0  . HYDROcodone-acetaminophen (NORCO/VICODIN) 5-325 MG tablet Take 1 tablet by mouth every 6 (six) hours as needed for  moderate pain (Must last 15 days.Do not take and drive a car or use machinery.). 40 tablet 0  . labetalol (NORMODYNE) 200 MG tablet Take 1 tablet (200 mg total) by mouth 3 (three) times daily. 90 tablet 0  . Multiple Vitamin (MULTIVITAMIN WITH MINERALS) TABS tablet Take 1 tablet by mouth daily. 100 tablet 1  . nicotine (NICODERM CQ - DOSED IN MG/24 HR) 7 mg/24hr patch Place 2 patches (14 mg total) onto the skin daily. 30 patch 1  . pantoprazole (PROTONIX) 40 MG tablet Take 1 tablet (40 mg total) by mouth at bedtime. 30 tablet 0  . potassium chloride SA (K-DUR,KLOR-CON) 20 MEQ tablet Take 1 tablet (20 mEq total) by mouth 2 (two) times daily. 60 tablet 0  . sodium chloride (OCEAN) 0.65 % SOLN nasal spray Place 1 spray into both nostrils as needed for congestion.  0  . Solifenacin Succinate (VESICARE PO) Take by mouth.    Marland Kitchen tiZANidine (ZANAFLEX) 4 MG tablet TAKE (2) TABLETS BY MOUTH AT BEDTIME. 60 tablet 0  . traZODone (DESYREL) 100 MG tablet Take 1 tablet (100 mg total) by mouth at bedtime. 30 tablet 1   No current facility-administered medications for this visit.      Physical Exam  Blood pressure 138/88, pulse 86.  Constitutional: overall normal hygiene, normal nutrition, well developed, normal grooming, normal body habitus. Assistive device:wheelchair  Musculoskeletal: gait and station Limp in wheelchair, muscle tone and strength are normal on the right, weaker on the left from the CVA, no tremors or atrophy is present.  .  Neurological: coordination overall normal.  Deep tendon reflex/nerve stretch intact.  Sensation normal.  Cranial nerves II-XII intact.   Skin:   Normal overall no scars, lesions, ulcers or rashes. No psoriasis.  Psychiatric: Alert and oriented x 3.  Recent memory intact, remote memory unclear.  Normal mood and affect. Well groomed.  Good eye contact.  Cardiovascular: overall no swelling, no varicosities, no edema bilaterally, normal temperatures of the legs and  arms, no clubbing, cyanosis and good capillary refill.  Lymphatic: palpation is normal.  Examination of right Upper Extremity is done.  Inspection:   Overall:  Elbow non-tender without crepitus or defects, forearm non-tender without crepitus or defects, wrist non-tender without crepitus or defects, hand non-tender.    Shoulder: with glenohumeral joint tenderness, without effusion.   Upper arm: without swelling and tenderness   Range of motion:   Overall:  Full range of motion of the elbow, full range of motion of wrist and full range of motion in fingers.   Shoulder:  right  165 degrees forward flexion; 145 degrees abduction; 30 degrees internal rotation, 30 degrees external rotation, 15 degrees extension, 40  degrees adduction.   Stability:   Overall:  Shoulder, elbow and wrist stable   Strength and Tone:   Overall full shoulder muscles strength, full upper arm strength and normal upper arm bulk and tone.   The patient has been educated about the nature of the problem(s) and counseled on treatment options.  The patient appeared to understand what I have discussed and is in agreement with it.  Encounter Diagnoses  Name Primary?  . Chronic right shoulder pain Yes  . Essential hypertension   . Hemiparesis affecting left side as late effect of stroke ([place])   . Cigarette nicotine dependence without complication     PLAN Call if any problems.  Precautions discussed.  Continue current medications.   Return to clinic 2 months   Electronically Signed Sanjuana Kava, MD 10/10/20172:13 PM

## 2016-09-17 NOTE — Patient Instructions (Signed)
Smoking Cessation, Tips for Success If you are ready to quit smoking, congratulations! You have chosen to help yourself be healthier. Cigarettes bring nicotine, tar, carbon monoxide, and other irritants into your body. Your lungs, heart, and blood vessels will be able to work better without these poisons. There are many different ways to quit smoking. Nicotine gum, nicotine patches, a nicotine inhaler, or nicotine nasal spray can help with physical craving. Hypnosis, support groups, and medicines help break the habit of smoking. WHAT THINGS CAN I DO TO MAKE QUITTING EASIER?  Here are some tips to help you quit for good:  Pick a date when you will quit smoking completely. Tell all of your friends and family about your plan to quit on that date.  Do not try to slowly cut down on the number of cigarettes you are smoking. Pick a quit date and quit smoking completely starting on that day.  Throw away all cigarettes.   Clean and remove all ashtrays from your home, work, and car.  On a card, write down your reasons for quitting. Carry the card with you and read it when you get the urge to smoke.  Cleanse your body of nicotine. Drink enough water and fluids to keep your urine clear or pale yellow. Do this after quitting to flush the nicotine from your body.  Learn to predict your moods. Do not let a bad situation be your excuse to have a cigarette. Some situations in your life might tempt you into wanting a cigarette.  Never have "just one" cigarette. It leads to wanting another and another. Remind yourself of your decision to quit.  Change habits associated with smoking. If you smoked while driving or when feeling stressed, try other activities to replace smoking. Stand up when drinking your coffee. Brush your teeth after eating. Sit in a different chair when you read the paper. Avoid alcohol while trying to quit, and try to drink fewer caffeinated beverages. Alcohol and caffeine may urge you to  smoke.  Avoid foods and drinks that can trigger a desire to smoke, such as sugary or spicy foods and alcohol.  Ask people who smoke not to smoke around you.  Have something planned to do right after eating or having a cup of coffee. For example, plan to take a walk or exercise.  Try a relaxation exercise to calm you down and decrease your stress. Remember, you may be tense and nervous for the first 2 weeks after you quit, but this will pass.  Find new activities to keep your hands busy. Play with a pen, coin, or rubber band. Doodle or draw things on paper.  Brush your teeth right after eating. This will help cut down on the craving for the taste of tobacco after meals. You can also try mouthwash.   Use oral substitutes in place of cigarettes. Try using lemon drops, carrots, cinnamon sticks, or chewing gum. Keep them handy so they are available when you have the urge to smoke.  When you have the urge to smoke, try deep breathing.  Designate your home as a nonsmoking area.  If you are a heavy smoker, ask your health care provider about a prescription for nicotine chewing gum. It can ease your withdrawal from nicotine.  Reward yourself. Set aside the cigarette money you save and buy yourself something nice.  Look for support from others. Join a support group or smoking cessation program. Ask someone at home or at work to help you with your plan   to quit smoking.  Always ask yourself, "Do I need this cigarette or is this just a reflex?" Tell yourself, "Today, I choose not to smoke," or "I do not want to smoke." You are reminding yourself of your decision to quit.  Do not replace cigarette smoking with electronic cigarettes (commonly called e-cigarettes). The safety of e-cigarettes is unknown, and some may contain harmful chemicals.  If you relapse, do not give up! Plan ahead and think about what you will do the next time you get the urge to smoke. HOW WILL I FEEL WHEN I QUIT SMOKING? You  may have symptoms of withdrawal because your body is used to nicotine (the addictive substance in cigarettes). You may crave cigarettes, be irritable, feel very hungry, cough often, get headaches, or have difficulty concentrating. The withdrawal symptoms are only temporary. They are strongest when you first quit but will go away within 10-14 days. When withdrawal symptoms occur, stay in control. Think about your reasons for quitting. Remind yourself that these are signs that your body is healing and getting used to being without cigarettes. Remember that withdrawal symptoms are easier to treat than the major diseases that smoking can cause.  Even after the withdrawal is over, expect periodic urges to smoke. However, these cravings are generally short lived and will go away whether you smoke or not. Do not smoke! WHAT RESOURCES ARE AVAILABLE TO HELP ME QUIT SMOKING? Your health care provider can direct you to community resources or hospitals for support, which may include:  Group support.  Education.  Hypnosis.  Therapy.   This information is not intended to replace advice given to you by your health care provider. Make sure you discuss any questions you have with your health care provider.   Document Released: 08/23/2004 Document Revised: 12/16/2014 Document Reviewed: 05/13/2013 Elsevier Interactive Patient Education 2016 [employer]vier Inc.  

## 2016-09-18 ENCOUNTER — Ambulatory Visit: Payer: Medicaid Other | Admitting: Nurse Practitioner

## 2016-09-19 ENCOUNTER — Encounter: Payer: Self-pay | Admitting: Nurse Practitioner

## 2016-10-02 ENCOUNTER — Telehealth: Payer: Self-pay | Admitting: Orthopaedic Surgery

## 2016-10-18 ENCOUNTER — Other Ambulatory Visit: Payer: Self-pay | Admitting: Orthopaedic Surgery

## 2016-10-18 ENCOUNTER — Ambulatory Visit (HOSPITAL_BASED_OUTPATIENT_CLINIC_OR_DEPARTMENT_OTHER): Payer: Medicaid Other | Admitting: Physical Medicine & Rehabilitation

## 2016-10-18 ENCOUNTER — Encounter: Payer: Medicaid Other | Attending: Physical Medicine & Rehabilitation

## 2016-10-18 ENCOUNTER — Encounter: Payer: Self-pay | Admitting: Physical Medicine & Rehabilitation

## 2016-10-18 VITALS — BP 143/93 | HR 89 | Resp 16

## 2016-10-18 DIAGNOSIS — I69319 Unspecified symptoms and signs involving cognitive functions following cerebral infarction: Secondary | ICD-10-CM | POA: Diagnosis present

## 2016-10-18 DIAGNOSIS — G8114 Spastic hemiplegia affecting left nondominant side: Secondary | ICD-10-CM | POA: Insufficient documentation

## 2016-10-18 DIAGNOSIS — G8110 Spastic hemiplegia affecting unspecified side: Secondary | ICD-10-CM | POA: Diagnosis not present

## 2016-10-18 DIAGNOSIS — E785 Hyperlipidemia, unspecified: Secondary | ICD-10-CM | POA: Diagnosis not present

## 2016-10-18 DIAGNOSIS — F329 Major depressive disorder, single episode, unspecified: Secondary | ICD-10-CM | POA: Insufficient documentation

## 2016-10-18 DIAGNOSIS — K219 Gastro-esophageal reflux disease without esophagitis: Secondary | ICD-10-CM | POA: Insufficient documentation

## 2016-10-18 DIAGNOSIS — G8929 Other chronic pain: Secondary | ICD-10-CM | POA: Diagnosis present

## 2016-10-18 DIAGNOSIS — I10 Essential (primary) hypertension: Secondary | ICD-10-CM | POA: Insufficient documentation

## 2016-10-18 NOTE — Patient Instructions (Signed)
Prescription for left ankle brace to help with walking. Repeat Botox early January. Then order more physical therapy

## 2016-10-18 NOTE — Progress Notes (Signed)
Subjective:    Patient ID: Taylor Obrien, male    DOB: 04-18-1966, 50 y.o.   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 Underwent ototoxic injection 150 units into the left quadricep, 150 units into the left hamstrings. Pain Inventory Average Pain 8 Pain Right Now 8 My pain is sharp and aching  In the last 24 hours, has pain interfered with the following? General activity 0 Relation with others 0 Enjoyment of life 0 What TIME of day is your pain at its worst? not answered Sleep (in general) NA  Pain is worse with: some activites Pain improves with: medication and "smokes weed and it makes it lots bettef" Relief from Meds: from meds  Mobility walk with assistance ability to climb steps?  no do you drive?  no  Function disabled: date disabled . I need assistance with the following:  dressing and bathing  Neuro/Psych bladder control problems confusion depression anxiety  Prior Studies Any changes since last visit?  no  Physicians involved in your care Any changes since last visit?  no   Family History  Problem Relation Age of Onset  . Diabetes Mother   . Hypertension Mother   . Drug abuse Mother   . Hyperlipidemia Mother   . Diabetes Father   . Hypertension Father   . Hyperlipidemia Father   . Diabetes Brother   . Hypertension Brother    Social History   Social History  . Marital status: Married    Spouse name: N/A  . Number of children: N/A  . Years of education: N/A   Social History Main Topics  . Smoking status: Former Smoker    Packs/day: 0.50    Years: 30.00    Types: Cigarettes    Quit date: 11/17/2015  . Smokeless tobacco: Never Used  . Alcohol use No  . Drug use:     Types: Marijuana  . Sexual activity: Yes    Birth control/ protection: None   Other Topics Concern  . None   Social History Narrative  . None   Past Surgical History:  Procedure Laterality  Date  . CLOSED REDUCTION MANDIBULAR FRACTURE W/ ARCH BARS     + multiple extractions  . Condyloma resection    . RADIOLOGY WITH ANESTHESIA N/A 11/18/2015   Procedure: RADIOLOGY WITH ANESTHESIA;  Surgeon: Luanne Bras, MD;  Location: [place];  Service: Radiology;  Laterality: N/A;  . Removal foreign body right shoulder    . Right rotator cuff repair    . TEE WITHOUT CARDIOVERSION N/A 11/21/2015   Procedure: TRANSESOPHAGEAL ECHOCARDIOGRAM (TEE);  Surgeon: Larey Dresser, MD;  Location: [place];  Service: Cardiovascular;  Laterality: N/A;   Past Medical History:  Diagnosis Date  . Chronic pain   . Depression   . Essential hypertension, benign   . GERD (gastroesophageal reflux disease)   . HTN (hypertension) 11/27/2015  . Hyperlipidemia   . Stroke ([place])    BP (!) 143/93   Pulse 89   Resp 16   SpO2 93%   Opioid Risk Score:   Fall Risk Score:  `1  Depression screen PHQ 2/9  Depression screen [facility] 2/9 10/18/2016 01/19/2016  Decreased Interest 2 0  Down, Depressed, Hopeless 2 0  PHQ - 2 Score 4 0    Review of Systems  Constitutional: Positive for unexpected weight change.  Respiratory: Positive for apnea and cough.   Genitourinary: Positive for  difficulty urinating.  All other systems reviewed and are negative.      Objective:   Physical Exam  Constitutional: He is oriented to person, place, and time. He appears well-developed and well-nourished.  HENT:  Head: Normocephalic and atraumatic.  Eyes: Conjunctivae and EOM are normal. Pupils are equal, round, and reactive to light.  Neurological: He is alert and oriented to person, place, and time. He exhibits abnormal muscle tone.  [place] 2 at the left quadriceps and left hamstrings  Patient has footdrop on the left side. Trace ankle plantar flexors 0, ankle dorsiflexion 0, foot inversion and eversion. No evidence of clonus at the ankle  Skin: Skin is warm and dry.  Psychiatric: He has a normal mood and affect.    Nursing note and vitals reviewed.         Assessment & Plan:   1. Left spastic hemiplegia, improvement in proximal lower extremity tone after botulinum toxin injection as noted above. He has left foot drop and would benefit from left AFO to help control this as well as help control January bottom. Referral to orthotist. Repeat Botox in approximately 6 weeks after which we'll refer to outpatient PT

## 2016-10-19 ENCOUNTER — Other Ambulatory Visit: Payer: Self-pay | Admitting: Orthopaedic Surgery

## 2016-10-21 MED ORDER — HYDROCODONE-ACETAMINOPHEN 5-325 MG PO TABS: 1.0000 | ORAL_TABLET | Freq: Four times a day (QID) | ORAL | 0 refills | Status: DC | PRN

## 2016-10-21 NOTE — Telephone Encounter (Signed)
Refer to MyChart refill request below.  (Patient aware that due to Dr Luna Glasgow out of office this week, Dr Aline Brochure to review)

## 2016-10-28 MED ORDER — HYDROCODONE-ACETAMINOPHEN 5-325 MG PO TABS: 1.0000 | ORAL_TABLET | Freq: Four times a day (QID) | ORAL | 0 refills | Status: DC | PRN

## 2016-10-28 NOTE — Addendum Note (Signed)
Addended by: Willette Pa on: 10/28/2016 03:33 PM   Modules accepted: Orders

## 2016-11-05 ENCOUNTER — Telehealth: Payer: Self-pay | Admitting: Orthopaedic Surgery

## 2016-11-05 MED ORDER — HYDROCODONE-ACETAMINOPHEN 5-325 MG PO TABS: 1.0000 | ORAL_TABLET | Freq: Four times a day (QID) | ORAL | 0 refills | Status: DC | PRN

## 2016-11-11 ENCOUNTER — Ambulatory Visit (HOSPITAL_COMMUNITY): Payer: Medicaid Other | Admitting: Physical Therapy

## 2016-11-11 ENCOUNTER — Ambulatory Visit (HOSPITAL_COMMUNITY): Payer: Medicaid Other | Admitting: Specialist

## 2016-11-19 ENCOUNTER — Encounter: Payer: Self-pay | Admitting: Orthopaedic Surgery

## 2016-11-19 ENCOUNTER — Ambulatory Visit (INDEPENDENT_AMBULATORY_CARE_PROVIDER_SITE_OTHER): Payer: Medicaid Other | Admitting: Orthopaedic Surgery

## 2016-11-19 VITALS — BP 165/112 | HR 102 | Temp 97.5°F | Resp 18

## 2016-11-19 DIAGNOSIS — M25511 Pain in right shoulder: Secondary | ICD-10-CM

## 2016-11-19 DIAGNOSIS — I69354 Hemiplegia and hemiparesis following cerebral infarction affecting left non-dominant side: Secondary | ICD-10-CM

## 2016-11-19 DIAGNOSIS — F17210 Nicotine dependence, cigarettes, uncomplicated: Secondary | ICD-10-CM

## 2016-11-19 DIAGNOSIS — G8929 Other chronic pain: Secondary | ICD-10-CM

## 2016-11-19 DIAGNOSIS — I10 Essential (primary) hypertension: Secondary | ICD-10-CM | POA: Diagnosis not present

## 2016-11-19 MED ORDER — HYDROCODONE-ACETAMINOPHEN 5-325 MG PO TABS: 1.0000 | ORAL_TABLET | Freq: Four times a day (QID) | ORAL | 0 refills | Status: DC | PRN

## 2016-11-19 NOTE — Progress Notes (Signed)
Patient Taylor Obrien, male DOB:[birth year], 50 y.o. [id number]  Chief Complaint  Patient presents with  . Follow-up    Recheck right shoulder pain.    HPI  Taylor Obrien is a 50 y.o. male who has chronic right shoulder pain.  He has more pain with cold weather.  He has no new trauma, no paresthesias.  He is post CVA with left sided weakness. HPI  There is no height or weight on file to calculate BMI.  ROS  Review of Systems  HENT: Negative for congestion.   Respiratory: Negative for cough and shortness of breath.   Cardiovascular: Negative for chest pain and leg swelling.  Endocrine: Positive for cold intolerance.  Musculoskeletal: Positive for arthralgias, back pain and gait problem.  Allergic/Immunologic: Positive for environmental allergies.  Neurological: Positive for weakness.    Past Medical History:  Diagnosis Date  . Chronic pain   . Depression   . Essential hypertension, benign   . GERD (gastroesophageal reflux disease)   . HTN (hypertension) 11/27/2015  . Hyperlipidemia   . Stroke [facility])     Past Surgical History:  Procedure Laterality Date  . CLOSED REDUCTION MANDIBULAR FRACTURE W/ ARCH BARS     + multiple extractions  . Condyloma resection    . RADIOLOGY WITH ANESTHESIA N/A 11/18/2015   Procedure: RADIOLOGY WITH ANESTHESIA;  Surgeon: Luanne Bras, MD;  Location: [facility];  Service: Radiology;  Laterality: N/A;  . Removal foreign body right shoulder    . Right rotator cuff repair    . TEE WITHOUT CARDIOVERSION N/A 11/21/2015   Procedure: TRANSESOPHAGEAL ECHOCARDIOGRAM (TEE);  Surgeon: Larey Dresser, MD;  Location: [facility] ENDOSCOPY;  Service: Cardiovascular;  Laterality: N/A;    Family History  Problem Relation Age of Onset  . Diabetes Mother   . Hypertension Mother   . Drug abuse Mother   . Hyperlipidemia Mother   . Diabetes Father   . Hypertension Father   . Hyperlipidemia Father   . Diabetes Brother   . Hypertension Brother     Social  History Social History  Substance Use Topics  . Smoking status: Former Smoker    Packs/day: 0.50    Years: 30.00    Types: Cigarettes    Quit date: 11/17/2015  . Smokeless tobacco: Never Used  . Alcohol use No    Allergies  Allergen Reactions  . Oxcarbazepine Other (See Comments)    Patient goes out of right state of mind.     Current Outpatient Prescriptions  Medication Sig Dispense Refill  . acetaminophen (TYLENOL) 500 MG tablet Take 2 tablets (1,000 mg total) by mouth every 8 (eight) hours as needed. 30 tablet 0  . ALPRAZolam (XANAX) 0.25 MG tablet Take 1 tablet (0.25 mg total) by mouth 2 (two) times daily as needed for anxiety (uncontrolled left arm movements). 15 tablet 0  . amLODipine (NORVASC) 10 MG tablet Take 1 tablet (10 mg total) by mouth daily. 30 tablet 0  . aspirin 325 MG tablet Take 1 tablet (325 mg total) by mouth daily. 100 tablet 0  . atorvastatin (LIPITOR) 40 MG tablet Take 1 tablet (40 mg total) by mouth daily at 6 PM. 30 tablet 0  . chlorthalidone (HYGROTON) 50 MG tablet Take 1 tablet (50 mg total) by mouth daily. 30 tablet 0  . citalopram (CELEXA) 10 MG tablet Take 1 tablet (10 mg total) by mouth daily. 30 tablet 0  . cloNIDine (CATAPRES - DOSED IN MG/24 HR) 0.3 mg/24hr patch Place 1  patch (0.3 mg total) onto the skin once a week. Next change on monday 4 patch 12  . clotrimazole (LOTRIMIN) 1 % cream Apply topically 2 (two) times daily. 60 g 0  . folic acid (FOLVITE) 1 MG tablet Take 1 tablet (1 mg total) by mouth daily. 30 tablet 0  . hydrocerin (EUCERIN) CREA Apply 1 application topically 3 (three) times daily. Available over the counter  0  . HYDROcodone-acetaminophen (NORCO/VICODIN) 5-325 MG tablet Take 1 tablet by mouth every 6 (six) hours as needed for moderate pain. 25 tablet 0  . labetalol (NORMODYNE) 200 MG tablet Take 1 tablet (200 mg total) by mouth 3 (three) times daily. 90 tablet 0  . Multiple Vitamin (MULTIVITAMIN WITH MINERALS) TABS tablet Take 1  tablet by mouth daily. 100 tablet 1  . nicotine (NICODERM CQ - DOSED IN MG/24 HR) 7 mg/24hr patch Place 2 patches (14 mg total) onto the skin daily. 30 patch 1  . pantoprazole (PROTONIX) 40 MG tablet Take 1 tablet (40 mg total) by mouth at bedtime. 30 tablet 0  . potassium chloride SA (K-DUR,KLOR-CON) 20 MEQ tablet Take 1 tablet (20 mEq total) by mouth 2 (two) times daily. 60 tablet 0  . sodium chloride (OCEAN) 0.65 % SOLN nasal spray Place 1 spray into both nostrils as needed for congestion.  0  . Solifenacin Succinate (VESICARE PO) Take by mouth.    Marland Kitchen tiZANidine (ZANAFLEX) 4 MG tablet TAKE (2) TABLETS BY MOUTH AT BEDTIME. 60 tablet 0  . traZODone (DESYREL) 100 MG tablet Take 1 tablet (100 mg total) by mouth at bedtime. 30 tablet 1   No current facility-administered medications for this visit.      Physical Exam  Blood pressure (!) 165/112, pulse (!) 102, temperature 97.5 F (36.4 C), resp. rate 18.  Constitutional: overall normal hygiene, normal nutrition, well developed, normal grooming, normal body habitus. Assistive device:wheelchair  Musculoskeletal: gait and station Limp in wheelchair, muscle tone and strength are normal, no tremors or atrophy is present.  .  Neurological:weakness left side secondary to old CVA  Sensation normal.  Cranial nerves II-XII intact.   Skin:   Normal overall no scars, lesions, ulcers or rashes. No psoriasis.  Psychiatric: Alert and oriented x 3.  Recent memory intact, remote memory unclear.  Normal mood and affect. Well groomed.  Good eye contact.  Cardiovascular: overall no swelling, no varicosities, no edema bilaterally, normal temperatures of the legs and arms, no clubbing, cyanosis and good capillary refill.  Lymphatic: palpation is normal.  Examination of right Upper Extremity is done.  Inspection:   Overall:  Elbow non-tender without crepitus or defects, forearm non-tender without crepitus or defects, wrist non-tender without crepitus or  defects, hand non-tender.    Shoulder: with glenohumeral joint tenderness, without effusion.   Upper arm: without swelling and tenderness   Range of motion:   Overall:  Full range of motion of the elbow, full range of motion of wrist and full range of motion in fingers.   Shoulder:  right  165 degrees forward flexion; 140 degrees abduction; 30 degrees internal rotation, 30 degrees external rotation, 10 degrees extension, 40 degrees adduction.   Stability:   Overall:  Shoulder, elbow and wrist stable   Strength and Tone:   Overall full shoulder muscles strength, full upper arm strength and normal upper arm bulk and tone.   The patient has been educated about the nature of the problem(s) and counseled on treatment options.  The patient appeared to understand  what I have discussed and is in agreement with it.  Encounter Diagnoses  Name Primary?  . Chronic right shoulder pain Yes  . Essential hypertension   . Hemiparesis affecting left side as late effect of stroke ([place])   . Cigarette nicotine dependence without complication    PROCEDURE NOTE:  The patient request injection, verbal consent was obtained.  The right shoulder was prepped appropriately after time out was performed.   Sterile technique was observed and injection of 1 cc of Depo-Medrol 40 mg with several cc's of plain xylocaine. Anesthesia was provided by ethyl chloride and a 20-gauge needle was used to inject the shoulder area. A posterior approach was used.  The injection was tolerated well.  A band aid dressing was applied.  The patient was advised to apply ice later today and tomorrow to the injection sight as needed.   PLAN Call if any problems.  Precautions discussed.  Continue current medications.   Return to clinic 3 months   Electronically [place], MD 12/12/20172:38 PM

## 2016-12-11 ENCOUNTER — Ambulatory Visit (HOSPITAL_COMMUNITY): Payer: Medicaid Other | Admitting: Physical Therapy

## 2016-12-11 ENCOUNTER — Ambulatory Visit (HOSPITAL_COMMUNITY): Payer: Medicaid Other | Attending: Family | Admitting: Specialist

## 2016-12-11 DIAGNOSIS — I63031 Cerebral infarction due to thrombosis of right carotid artery: Secondary | ICD-10-CM | POA: Insufficient documentation

## 2016-12-11 DIAGNOSIS — R2689 Other abnormalities of gait and mobility: Secondary | ICD-10-CM | POA: Insufficient documentation

## 2016-12-11 DIAGNOSIS — I63521 Cerebral infarction due to unspecified occlusion or stenosis of right anterior cerebral artery: Secondary | ICD-10-CM | POA: Insufficient documentation

## 2016-12-11 DIAGNOSIS — R262 Difficulty in walking, not elsewhere classified: Secondary | ICD-10-CM | POA: Insufficient documentation

## 2016-12-11 DIAGNOSIS — R279 Unspecified lack of coordination: Secondary | ICD-10-CM | POA: Insufficient documentation

## 2016-12-11 DIAGNOSIS — R2681 Unsteadiness on feet: Secondary | ICD-10-CM | POA: Insufficient documentation

## 2016-12-11 NOTE — Therapy (Signed)
Taylor Obrien was referred to occupational therapy for evaluation and treatment s/p right cva with left side weakness.  He presents to our clinic today with functional use of his left arm, stating his main issue is his lower extremity.  All B/ADL deficits are related to lower extremity.  [name] insurance reimburses only one evaluation per year, therefore, occupational therapy completing screen only so that patient may be evaluated by physical therapy for lower extremity deficits.  Patient and wife agree with this course of treatment.  Patient rescheduled for a PT evaluation on 12/17/16 at 9 am.   Vangie Bicker, [place], OTR/L [phone]

## 2016-12-12 ENCOUNTER — Encounter: Payer: Self-pay | Admitting: Physical Medicine & Rehabilitation

## 2016-12-12 ENCOUNTER — Ambulatory Visit (HOSPITAL_BASED_OUTPATIENT_CLINIC_OR_DEPARTMENT_OTHER): Payer: Medicaid Other | Admitting: Physical Medicine & Rehabilitation

## 2016-12-12 ENCOUNTER — Encounter: Payer: Medicaid Other | Attending: Physical Medicine & Rehabilitation

## 2016-12-12 VITALS — BP 121/84 | HR 84

## 2016-12-12 DIAGNOSIS — I10 Essential (primary) hypertension: Secondary | ICD-10-CM | POA: Insufficient documentation

## 2016-12-12 DIAGNOSIS — I69319 Unspecified symptoms and signs involving cognitive functions following cerebral infarction: Secondary | ICD-10-CM | POA: Insufficient documentation

## 2016-12-12 DIAGNOSIS — G8114 Spastic hemiplegia affecting left nondominant side: Secondary | ICD-10-CM | POA: Insufficient documentation

## 2016-12-12 DIAGNOSIS — E785 Hyperlipidemia, unspecified: Secondary | ICD-10-CM | POA: Insufficient documentation

## 2016-12-12 DIAGNOSIS — G8929 Other chronic pain: Secondary | ICD-10-CM | POA: Diagnosis present

## 2016-12-12 DIAGNOSIS — F329 Major depressive disorder, single episode, unspecified: Secondary | ICD-10-CM | POA: Insufficient documentation

## 2016-12-12 DIAGNOSIS — K219 Gastro-esophageal reflux disease without esophagitis: Secondary | ICD-10-CM | POA: Insufficient documentation

## 2016-12-12 NOTE — Patient Instructions (Signed)
No driving recommended

## 2016-12-12 NOTE — Progress Notes (Signed)
Botox Injection for spasticity using needle EMG guidance  Dilution: 50 Units/ml Indication: Severe spasticity which interferes with ADL,mobility and/or  hygiene and is unresponsive to medication management and other conservative care Informed consent was obtained after describing risks and benefits of the procedure with the patient. This includes bleeding, bruising, infection, excessive weakness, or medication side effects. A REMS form is on file and signed. Needle: 25g 2" needle electrode Number of units per muscle VMO 50 VastLat 25 Rectus fem 50 VastInt 25 Hamstrings150 All injections were done after obtaining appropriate EMG activity and after negative drawback for blood. The patient tolerated the procedure well. Post procedure instructions were given. A followup appointment was made.  After the procedure, the patient stated that he wished to drive and I expressed my opinion that he is not safe to return to driving.   He also requested pain medication, offered to call in some tramadol. He demanded something stronger like oxycodone. He then started shouting in the waiting room of the office. Security was called and patient was escorted off the premises

## 2016-12-17 ENCOUNTER — Ambulatory Visit (HOSPITAL_COMMUNITY): Payer: Medicaid Other | Admitting: Physical Therapy

## 2016-12-17 DIAGNOSIS — I63031 Cerebral infarction due to thrombosis of right carotid artery: Secondary | ICD-10-CM | POA: Diagnosis not present

## 2016-12-17 DIAGNOSIS — I63521 Cerebral infarction due to unspecified occlusion or stenosis of right anterior cerebral artery: Secondary | ICD-10-CM | POA: Diagnosis not present

## 2016-12-17 DIAGNOSIS — R262 Difficulty in walking, not elsewhere classified: Secondary | ICD-10-CM

## 2016-12-17 DIAGNOSIS — R2681 Unsteadiness on feet: Secondary | ICD-10-CM | POA: Diagnosis present

## 2016-12-17 DIAGNOSIS — R279 Unspecified lack of coordination: Secondary | ICD-10-CM

## 2016-12-17 DIAGNOSIS — R2689 Other abnormalities of gait and mobility: Secondary | ICD-10-CM

## 2016-12-17 NOTE — Therapy (Signed)
Taylor Obrien, [place], [address] Phone: [phone]   Fax:  [phone]  Physical Therapy Evaluation  Patient Details  Name: Taylor Obrien MRN: [id number] Date of Birth: [birth year] Referring Provider: Bradd Obrien   Encounter Date: 12/17/2016      PT End of Session - 12/17/16 0951    Visit Number 1   Number of Visits 1   Authorization Type Medicaid [place]    Authorization Time Period 12/17/16 to 12/17/16   PT Start Time 0911   PT Stop Time 0943   PT Time Calculation (min) 32 min   Equipment Utilized During Treatment Gait belt   Activity Tolerance Patient tolerated treatment well   Behavior During Therapy [facility] for tasks assessed/performed      Past Medical History:  Diagnosis Date  . Chronic pain   . Depression   . Essential hypertension, benign   . GERD (gastroesophageal reflux disease)   . HTN (hypertension) 11/27/2015  . Hyperlipidemia   . Stroke [facility])     Past Surgical History:  Procedure Laterality Date  . CLOSED REDUCTION MANDIBULAR FRACTURE W/ ARCH BARS     + multiple extractions  . Condyloma resection    . RADIOLOGY WITH ANESTHESIA N/A 11/18/2015   Procedure: RADIOLOGY WITH ANESTHESIA;  Surgeon: Taylor Bras, MD;  Location: [facility];  Service: Radiology;  Laterality: N/A;  . Removal foreign body right shoulder    . Right rotator cuff repair    . TEE WITHOUT CARDIOVERSION N/A 11/21/2015   Procedure: TRANSESOPHAGEAL ECHOCARDIOGRAM (TEE);  Surgeon: Taylor Dresser, MD;  Location: [facility];  Service: Cardiovascular;  Laterality: N/A;    There were no vitals filed for this visit.       Subjective Assessment - 12/17/16 0915    Subjective Patient arrives stating that keeping his balance is the hardest thing for him right now; no falls recently and he has mostly been getting around in his WC. No acute strokes since last time he was seen by PT. He has not been keeping up with his HEP that he was given  by PT before. He reports that he is very stressed out and depressed recently.    Pertinent History CVA, HTN, OSA, L hemiplegia/paresis, depression, GERD   How long can you sit comfortably? unlimited    How long can you stand comfortably? able to stand with walker   How long can you walk comfortably? household distances with walker   Patient Stated Goals moving and walking better   Currently in Pain? Yes   Pain Score 8    Pain Location Other (Comment)  back and butt    Pain Orientation Right;Left   Pain Descriptors / Indicators Discomfort;Sharp   Pain Type Chronic pain   Pain Radiating Towards down B LEs    Pain Onset More than a month ago   Pain Frequency Constant   Aggravating Factors  back spasms, sitting a long time    Pain Relieving Factors getting out of chair    Effect of Pain on Daily Activities impact on life/mobility             [facility] PT Assessment - 12/17/16 0001      Assessment   Medical Diagnosis history of stroke    Referring Provider Taylor Obrien    Onset Date/Surgical Date --  chronic    Next MD Visit Febuary 2018     Precautions   Precaution Comments fall  Balance Screen   Has the patient fallen in the past 6 months No   Has the patient had a decrease in activity level because of a fear of falling?  Yes   Is the patient reluctant to leave their home because of a fear of falling?  No     Prior Function   Level of Independence Needs assistance with ADLs;Needs assistance with gait;Requires assistive device for independence     Strength   Right Hip Flexion 5/5   Right Hip ABduction 5/5  seated horizontal ABD    Left Hip Flexion 2+/5   Left Hip ABduction 3-/5  seated horizontal ABD    Right Knee Flexion 4+/5   Right Knee Extension 5/5   Left Knee Flexion 1/5   Left Knee Extension 2/5   Right Ankle Dorsiflexion 5/5   Left Ankle Dorsiflexion 2/5         Mod(I) sit to stand/stand to sit; min guard in parallel bars for chronic  hemiplegic/spastic gait pattern                   PT Education - 12/17/16 0950    Education provided Yes   Education Details Medicaid limitations for chronic CVA; HEP, pro bono clinic information   Person(s) Educated Patient;Spouse   Methods Explanation   Comprehension Verbalized understanding          PT Short Term Goals - 12/17/16 0953      PT SHORT TERM GOAL #1   Title Patient/caregiver to be independent in correctly and safety performing assigned HEP    Time 1   Period Days   Status New     PT SHORT TERM GOAL #2   Title Patient and caregiver to be educated and aware of pro bono clinic contact information and benefits    Time 1   Period Days   Status New           PT Long Term Goals - 12/17/16 [id number]      PT LONG TERM GOAL #1   Title N/A one time visit only                Plan - 12/17/16 0952    Clinical Impression Statement Patient arrives with chronic symptoms from an old CVA; he reports that his mobility has improved since last time he came to PT but he is still worried about his walking and balance. Due to insurance limitations he is only eligible for one visit at this time. Examination reveals ongoing chronic CVA symptoms including L LE weakness and spasticity, poor L LE proprioception, general significant unsteadiness, severe gait impairment, reduced safety awareness, and overall reduced mobility. Discussed and trialed all activities/exercises that DPT would like patient to include in HEP moving forward. Gave patient/family contact information for local pro bono clinics and educated them to contact one of these for safe ongoing balance training; will also plan to send order for [employer] wheelchair cushion to referring clinician. Patient only eligible for skilled evaluation at this time, one visit only  at this time.    Rehab Potential Good   Clinical Impairments Affecting Rehab Potential 1 time visit only, referred to pro bono    PT Frequency One  time visit   PT Duration Other (comment)  1x visit only    PT Treatment/Interventions ADLs/Self Care Home Management   PT Next Visit Plan 1 time visit only , referred to pro bono clinic    Consulted and Agree with Plan  of Care Patient;Family member/caregiver   Family Member Consulted spouse       Patient will benefit from skilled therapeutic intervention in order to improve the following deficits and impairments:  Abnormal gait, Decreased activity tolerance, Decreased balance, Decreased mobility, Decreased knowledge of use of DME, Decreased endurance, Decreased coordination, Decreased range of motion, Decreased strength, Difficulty walking, Increased muscle spasms, Impaired flexibility, Impaired sensation, Improper body mechanics, Postural dysfunction, Impaired tone, Pain  Visit Diagnosis: Unspecified lack of coordination - Plan: PT plan of care cert/re-cert  Other abnormalities of gait and mobility - Plan: PT plan of care cert/re-cert  Difficulty in walking, not elsewhere classified - Plan: PT plan of care cert/re-cert  Unsteadiness on feet - Plan: PT plan of care cert/re-cert     Problem List Patient Active Problem List   Diagnosis Date Noted  . Spastic hemiplegia affecting nondominant side ([place]) 06/03/2016  . Dysuria   . OSA (obstructive sleep apnea)   . Hypokalemia   . Elevated blood pressure   . Hemiparesis affecting left side as late effect of stroke ([place])   . Epistaxis   . HTN (hypertension) 11/27/2015  . AKI (acute kidney injury) ([place]) 11/27/2015  . Hemiplegia and hemiparesis following unspecified cerebrovascular disease affecting left non-dominant side ([place]) 11/23/2015  . Gait disturbance, post-stroke 11/23/2015  . Stroke due to occlusion of right anterior cerebral artery ([place]) 11/23/2015  . Cerebrovascular accident (CVA) due to occlusion of right anterior cerebral artery ([place])   . Essential hypertension   . Depression   . Chronic pain syndrome   . ETOH abuse   .  Marijuana abuse   . Cerebrovascular accident (CVA) due to thrombosis of right carotid artery ([place])   . Malignant hypertension   . Hyperlipidemia   . CVA (cerebral infarction) 11/18/2015  . Stroke (cerebrum) ([place]) 11/18/2015  . Chest pain 10/09/2012  . GERD (gastroesophageal reflux disease) 10/09/2012  . Chronic back pain 10/09/2012  . Tobacco abuse 10/09/2012  . Hypertensive heart disease 08/31/2012  . Accelerated hypertension 08/31/2012  . Precordial pain 08/31/2012     Deniece Ree PT, DPT [facility] [address] [place], [place], [address] Phone: [phone]   Fax:  [phone]  Name: HAMAD VANDUYNE MRN: [id number] Date of Birth: October 27, 1966

## 2016-12-17 NOTE — Patient Instructions (Signed)
Chair Push-Up    With hands pushing down on armrests, lean forward and try to lift buttocks. Return to sitting position. Repeat __4__ times per hour. You may also come to a full stand if you have a walker or counter in front of you for safety.  http://gt2.exer.us/465   Copyright  VHI. All rights reserved.     STANDING HEEL RAISES  While standing, raise up on your toes as you lift your heels off the ground.  Do this in front of a counter or in your walker with wheelchair behind you for safety.  Repeat 10 times, twice a day.    WEIGHT SHIFT - LATERAL  While in a standing position and knees partially bent, slowly shift your body weight side-to-side.  Do this at a counter top or in your walker with your wheelchair behind you for safety.  Repeat 10 times, twice a day.  SLOW AND CONTROLLED.      ELASTIC BAND - SEATED CLAMS  While sitting in a chair and an elastic band wrapped around your knees, move both knees to the sides to separate your legs. Keep contact of your feet on the floor the entire time.   Repeat 10 times, twice a day.     GLUTE SET - SUPINE  While lying on your back or sitting in your char, squeeze your buttocks and hold 3 seconds..   Repeat 10 times, twice a day.

## 2016-12-18 ENCOUNTER — Telehealth: Payer: Self-pay | Admitting: Orthopaedic Surgery

## 2016-12-18 NOTE — Telephone Encounter (Signed)
Dr. Brooke Bonito patient requests a refill on Hydrocodone/Acetaminophen (Norco)  5-325  Mgs.    Qty  25  Sig: Take 1 tablet by mouth every 6 (six) hours as needed for moderate pain.

## 2016-12-19 ENCOUNTER — Other Ambulatory Visit: Payer: Self-pay | Admitting: *Deleted

## 2016-12-19 MED ORDER — HYDROCODONE-ACETAMINOPHEN 5-325 MG PO TABS: 1.0000 | ORAL_TABLET | Freq: Four times a day (QID) | ORAL | 0 refills | Status: DC | PRN

## 2016-12-19 NOTE — Progress Notes (Signed)
Upon check out from the visit, Taylor Obrien realized that he did not have a prescription for pain medication. At the front desk, he started demanding his prescription.  We informed Mr. Cirello that Dr Letta Pate was not writing a prescription for a narcotic for him. He became enraged and threatening, and told the staff that he wanted his prescription and he did not want any f_____g tramadol either! He yelled that he would go off on this place if we did not give him his prescription.  He became increasingly agitated and frightened other patients in waiting room.  Practice manager Zorita Pang told Mr Aman to leave immediately. He refused. He wanted his prescription.  She continued to insist that he leave.  We have discharged him from the practice for aggressive and threatening behavior towards staff.

## 2016-12-31 ENCOUNTER — Ambulatory Visit (HOSPITAL_COMMUNITY): Payer: Self-pay

## 2016-12-31 ENCOUNTER — Telehealth (HOSPITAL_COMMUNITY): Payer: Self-pay | Admitting: Specialist

## 2016-12-31 DIAGNOSIS — I63521 Cerebral infarction due to unspecified occlusion or stenosis of right anterior cerebral artery: Secondary | ICD-10-CM

## 2016-12-31 NOTE — Telephone Encounter (Signed)
Pt has no vocie mail I will mail order for ROJO cushion for wheelchair to be purschased by the patient. NF 12/30/2016

## 2017-01-01 ENCOUNTER — Telehealth (HOSPITAL_COMMUNITY): Payer: Self-pay | Admitting: Physical Therapy

## 2017-01-01 ENCOUNTER — Encounter (HOSPITAL_COMMUNITY): Payer: Self-pay | Admitting: Physical Therapy

## 2017-01-01 ENCOUNTER — Telehealth: Payer: Self-pay | Admitting: Orthopaedic Surgery

## 2017-01-01 NOTE — Telephone Encounter (Signed)
Attempted to call patient to let him know we got ROJO cushion order back signed by MD, no answer and unable to leave message.   Deniece Ree PT, DPT [phone]

## 2017-01-01 NOTE — Telephone Encounter (Signed)
Hydrocodone-Acetaminophen  5/325mg  Qty 25 Tablets °

## 2017-01-01 NOTE — Telephone Encounter (Signed)
Attempted again to call patient regarding ROJO cushion order; no answer.   Deniece Ree PT, DPT [phone]

## 2017-01-01 NOTE — Telephone Encounter (Signed)
Denied.

## 2017-01-01 NOTE — Therapy (Signed)
[name], [place], [address] Phone: [phone]   Fax:  [phone]  Patient Details  Name: Taylor Obrien MRN: [id number] Date of Birth: [birth year] Referring Provider:  No ref. provider found  Encounter Date: 01/01/2017   I am aware that signed MD order for [facility] wheelchair cushion was faxed to our clinic, and personally attempted to contact patient/family multiple times today regarding this.   Unable to contact patient or family (see prior phone notes); spoke to front desk staff who reported that they had also tried, also with no success, and ultimately mailed a copy of the order to the patient's physical address.   Due to insurance limitations patient is ineligible for continued PT services however we will be happy to answer any questions they may have about this order/process of obtaining ROJO cushion.    Deniece Ree PT, DPT [phone]  [facility] [address] [place], [place], [address] Phone: [phone]   Fax:  [phone]

## 2017-01-01 NOTE — Therapy (Signed)
[place], [place], [address] Phone: [phone]   Fax:  [phone]  Patient Details  Name: Taylor Obrien MRN: [id number] Date of Birth: 1966/09/11 Referring Provider:  No ref. provider found  Encounter Date: 12/31/2016  error   Ailene Ravel, OTR/L,CBIS  [phone]  01/01/2017, 8:38 AM  [place], [place], [address] Phone: [phone]   Fax:  [phone]

## 2017-01-08 ENCOUNTER — Telehealth (HOSPITAL_COMMUNITY): Payer: Self-pay | Admitting: Nurse Practitioner

## 2017-01-08 NOTE — Telephone Encounter (Signed)
01/08/17 I called patient on 1/30 anda on 1/31 to let him know that his insurance will not cover for his therapy visit on 2/1.  I have been unable to reach him or his wife.  I also called his wife's # but the call didn't go thru.

## 2017-01-09 ENCOUNTER — Ambulatory Visit (HOSPITAL_COMMUNITY): Payer: Medicaid Other

## 2017-01-17 ENCOUNTER — Encounter (HOSPITAL_COMMUNITY): Payer: Self-pay | Admitting: Emergency Medicine

## 2017-01-17 ENCOUNTER — Emergency Department (HOSPITAL_COMMUNITY)
Admission: EM | Admit: 2017-01-17 | Discharge: 2017-01-17 | Disposition: A | Discharge status: Home / Self Care | Payer: Medicaid Other | Attending: Emergency Medicine | Admitting: Emergency Medicine

## 2017-01-17 DIAGNOSIS — Z87891 Personal history of nicotine dependence: Secondary | ICD-10-CM | POA: Diagnosis not present

## 2017-01-17 DIAGNOSIS — Z79899 Other long term (current) drug therapy: Secondary | ICD-10-CM | POA: Insufficient documentation

## 2017-01-17 DIAGNOSIS — Z7982 Long term (current) use of aspirin: Secondary | ICD-10-CM | POA: Insufficient documentation

## 2017-01-17 DIAGNOSIS — I10 Essential (primary) hypertension: Secondary | ICD-10-CM | POA: Diagnosis not present

## 2017-01-17 DIAGNOSIS — M545 Low back pain: Secondary | ICD-10-CM | POA: Insufficient documentation

## 2017-01-17 DIAGNOSIS — G8929 Other chronic pain: Secondary | ICD-10-CM | POA: Insufficient documentation

## 2017-01-17 MED ORDER — ACETAMINOPHEN 325 MG PO TABS
650.0000 mg | ORAL_TABLET | Freq: Once | ORAL | Status: AC
Start: 2017-01-17 — End: 2017-01-17
  Administered 2017-01-17: 650 mg via ORAL
  Filled 2017-01-17: qty 2

## 2017-01-17 MED ORDER — METHOCARBAMOL 500 MG PO TABS
1000.0000 mg | ORAL_TABLET | Freq: Four times a day (QID) | ORAL | 0 refills | Status: DC | PRN
Start: 2017-01-17 — End: 2018-01-24

## 2017-01-17 NOTE — ED Triage Notes (Signed)
Pt reports left lower back pain radiating down left leg since 2017.  Pt denies any new injury, states pain is worse today due to weather being cold.

## 2017-01-17 NOTE — Discharge Instructions (Signed)
Take the prescription as directed.  Apply moist heat or ice to the area(s) of discomfort, for 15 minutes at a time, several times per day for the next few days.  Do not fall asleep on a heating or ice pack.  Call your regular medical doctor and the Pain Management doctor today to schedule a follow up appointment next week.  Return to the Emergency Department immediately if worsening.

## 2017-01-17 NOTE — ED Notes (Signed)
Pt made aware to return if symptoms worsen or if any life threatening symptoms occur.   

## 2017-01-17 NOTE — ED Provider Notes (Signed)
[facility] DEPT Provider Note   CSN: [id number] Arrival date & time: 01/17/17  [id number]     History   Chief Complaint Chief Complaint  Patient presents with  . Back Pain    HPI Taylor Obrien is a 51 y.o. male.  HPI Pt was seen at 1025. Per pt, c/o gradual onset and persistence of constant acute flair of his chronic low back "pain" for the past several years.  Denies any change in his usual chronic pain pattern.  Pain worsens with palpation of the area and body position changes. Denies incont/retention of bowel or bladder, no saddle anesthesia, no focal motor weakness, no tingling/numbness in extremities, no fevers, no injury, no abd pain.   The symptoms have been associated with no other complaints. The patient has a significant history of similar symptoms previously, recently being evaluated for this complaint and multiple prior evals for same.    Past Medical History:  Diagnosis Date  . Chronic pain   . Depression   . Essential hypertension, benign   . GERD (gastroesophageal reflux disease)   . HTN (hypertension) 11/27/2015  . Hyperlipidemia   . Stroke [facility])     Patient Active Problem List   Diagnosis Date Noted  . Spastic hemiplegia affecting nondominant side ([place]) 06/03/2016  . Dysuria   . OSA (obstructive sleep apnea)   . Hypokalemia   . Elevated blood pressure   . Hemiparesis affecting left side as late effect of stroke ([place])   . Epistaxis   . HTN (hypertension) 11/27/2015  . AKI (acute kidney injury) ([place]) 11/27/2015  . Hemiplegia and hemiparesis following unspecified cerebrovascular disease affecting left non-dominant side ([place]) 11/23/2015  . Gait disturbance, post-stroke 11/23/2015  . Stroke due to occlusion of right anterior cerebral artery ([place]) 11/23/2015  . Cerebrovascular accident (CVA) due to occlusion of right anterior cerebral artery ([place])   . Essential hypertension   . Depression   . Chronic pain syndrome   . ETOH abuse   . Marijuana abuse   .  Cerebrovascular accident (CVA) due to thrombosis of right carotid artery ([place])   . Malignant hypertension   . Hyperlipidemia   . CVA (cerebral infarction) 11/18/2015  . Stroke (cerebrum) ([place]) 11/18/2015  . Chest pain 10/09/2012  . GERD (gastroesophageal reflux disease) 10/09/2012  . Chronic back pain 10/09/2012  . Tobacco abuse 10/09/2012  . Hypertensive heart disease 08/31/2012  . Accelerated hypertension 08/31/2012  . Precordial pain 08/31/2012    Past Surgical History:  Procedure Laterality Date  . CLOSED REDUCTION MANDIBULAR FRACTURE W/ ARCH BARS     + multiple extractions  . Condyloma resection    . RADIOLOGY WITH ANESTHESIA N/A 11/18/2015   Procedure: RADIOLOGY WITH ANESTHESIA;  Surgeon: Luanne Bras, MD;  Location: [facility];  Service: Radiology;  Laterality: N/A;  . Removal foreign body right shoulder    . Right rotator cuff repair    . TEE WITHOUT CARDIOVERSION N/A 11/21/2015   Procedure: TRANSESOPHAGEAL ECHOCARDIOGRAM (TEE);  Surgeon: Larey Dresser, MD;  Location: [facility];  Service: Cardiovascular;  Laterality: N/A;       Home Medications    Prior to Admission medications   Medication Sig Start Date End Date Taking? Authorizing Provider  acetaminophen (TYLENOL) 500 MG tablet Take 2 tablets (1,000 mg total) by mouth every 8 (eight) hours as needed. 12/20/15  Yes Ivan Anchors Love, PA-C  ALPRAZolam (XANAX) 0.25 MG tablet Take 1 tablet (0.25 mg total) by mouth 2 (two) times daily as needed for  anxiety (uncontrolled left arm movements). 12/20/15  Yes Ivan Anchors Love, PA-C  amLODipine (NORVASC) 10 MG tablet Take 1 tablet (10 mg total) by mouth daily. 01/19/16  Yes Charlett Blake, MD  atorvastatin (LIPITOR) 40 MG tablet Take 1 tablet (40 mg total) by mouth daily at 6 PM. 01/19/16  Yes Charlett Blake, MD  chlorthalidone (HYGROTON) 50 MG tablet Take 1 tablet (50 mg total) by mouth daily. 01/19/16  Yes Charlett Blake, MD  citalopram (CELEXA) 10 MG tablet Take 1  tablet (10 mg total) by mouth daily. 01/19/16  Yes Charlett Blake, MD  cloNIDine (CATAPRES - DOSED IN MG/24 HR) 0.3 mg/24hr patch Place 1 patch (0.3 mg total) onto the skin once a week. Next change on monday 12/20/15  Yes Ivan Anchors Love, PA-C  clotrimazole (LOTRIMIN) 1 % cream Apply topically 2 (two) times daily. 01/19/16  Yes Charlett Blake, MD  folic acid (FOLVITE) 1 MG tablet Take 1 tablet (1 mg total) by mouth daily. 12/20/15  Yes Ivan Anchors Love, PA-C  hydrocerin (EUCERIN) CREA Apply 1 application topically 3 (three) times daily. Available over the counter 12/20/15  Yes Ivan Anchors Love, PA-C  HYDROcodone-acetaminophen (NORCO/VICODIN) 5-325 MG tablet Take 1 tablet by mouth every 6 (six) hours as needed for moderate pain. 12/19/16  Yes Carole Civil, MD  labetalol (NORMODYNE) 200 MG tablet Take 1 tablet (200 mg total) by mouth 3 (three) times daily. 01/19/16  Yes Charlett Blake, MD  Multiple Vitamin (MULTIVITAMIN WITH MINERALS) TABS tablet Take 1 tablet by mouth daily. 12/20/15  Yes Ivan Anchors Love, PA-C  pantoprazole (PROTONIX) 40 MG tablet Take 1 tablet (40 mg total) by mouth at bedtime. 01/19/16  Yes Charlett Blake, MD  potassium chloride SA (K-DUR,KLOR-CON) 20 MEQ tablet Take 1 tablet (20 mEq total) by mouth 2 (two) times daily. 01/19/16  Yes Charlett Blake, MD  sodium chloride (OCEAN) 0.65 % SOLN nasal spray Place 1 spray into both nostrils as needed for congestion. 12/20/15  Yes Ivan Anchors Love, PA-C  Solifenacin Succinate (VESICARE PO) Take by mouth.   Yes Historical Provider, MD  tiZANidine (ZANAFLEX) 4 MG tablet TAKE (2) TABLETS BY MOUTH AT BEDTIME. 06/13/16  Yes Charlett Blake, MD  traZODone (DESYREL) 100 MG tablet Take 1 tablet (100 mg total) by mouth at bedtime. 01/19/16  Yes Charlett Blake, MD  aspirin 325 MG tablet Take 1 tablet (325 mg total) by mouth daily. 12/20/15   Bary Leriche, PA-C    Family History Family History  Problem Relation Age of Onset  . Diabetes Mother    . Hypertension Mother   . Drug abuse Mother   . Hyperlipidemia Mother   . Diabetes Father   . Hypertension Father   . Hyperlipidemia Father   . Diabetes Brother   . Hypertension Brother     Social History Social History  Substance Use Topics  . Smoking status: Former Smoker    Packs/day: 0.50    Years: 30.00    Types: Cigarettes    Quit date: 11/17/2015  . Smokeless tobacco: Never Used  . Alcohol use No     Allergies   Oxcarbazepine   Review of Systems Review of Systems ROS: Statement: All systems negative except as marked or noted in the HPI; Constitutional: Negative for fever and chills. ; ; Eyes: Negative for eye pain, redness and discharge. ; ; ENMT: Negative for ear pain, hoarseness, nasal congestion, sinus pressure and sore throat. ; ;  Cardiovascular: Negative for chest pain, palpitations, diaphoresis, dyspnea and peripheral edema. ; ; Respiratory: Negative for cough, wheezing and stridor. ; ; Gastrointestinal: Negative for nausea, vomiting, diarrhea, abdominal pain, blood in stool, hematemesis, jaundice and rectal bleeding. . ; ; Genitourinary: Negative for dysuria, flank pain and hematuria. ; ; Musculoskeletal: +LBP. Negative for neck pain. Negative for swelling and trauma.; ; Skin: Negative for pruritus, rash, abrasions, blisters, bruising and skin lesion.; ; Neuro: Negative for headache, lightheadedness and neck stiffness. Negative for weakness, altered level of consciousness, altered mental status, extremity weakness, paresthesias, involuntary movement, seizure and syncope.       Physical Exam Updated Vital Signs BP (!) 179/112 (BP Location: Left Arm)   Pulse 88   Temp 99 F (37.2 C) (Oral)   Resp 18   Ht 5\' 5"  (1.651 m)   Wt 210 lb (95.3 kg)   SpO2 99%   BMI 34.95 kg/m   Physical Exam 1030: Physical examination:  Nursing notes reviewed; Vital signs and O2 SAT reviewed;  Constitutional: Well developed, Well nourished, Well hydrated, In no acute distress;  Head:  Normocephalic, atraumatic; Eyes: EOMI, PERRL, No scleral icterus; ENMT: Mouth and pharynx normal, Mucous membranes moist; Neck: Supple, Full range of motion, No lymphadenopathy; Cardiovascular: Regular rate and rhythm, No gallop; Respiratory: Breath sounds clear & equal bilaterally, No wheezes.  Speaking full sentences with ease, Normal respiratory effort/excursion; Chest: Nontender, Movement normal; Abdomen: Soft, Nontender, Nondistended, Normal bowel sounds; Genitourinary: No CVA tenderness; Spine:  No midline CS, TS, LS tenderness. +mild TTP lumbar paraspinal muscles.;; Extremities: Pulses normal, No tenderness, No edema, No calf edema or asymmetry.; Neuro: AA&Ox3, Major CN grossly intact.  Speech clear. No gross focal motor or sensory deficits in extremities. Pt sitting in wheelchair and stool up quickly and without distress when I asked to examine him.; Skin: Color normal, Warm, Dry.   ED Treatments / Results  Labs (all labs ordered are listed, but only abnormal results are displayed)   EKG  EKG Interpretation None       Radiology   Procedures Procedures (including critical care time)  Medications Ordered in ED Medications - No data to display   Initial Impression / Assessment and Plan / ED Course  I have reviewed the triage vital signs and the nursing notes.  Pertinent labs & imaging results that were available during my care of the patient were reviewed by me and considered in my medical decision making (see chart for details).  MDM Reviewed: previous chart, nursing note and vitals   1035:  Long hx of chronic pain with multiple ED visits for same.  Pt endorses acute flair of his usual long standing chronic pain today, no change from his usual chronic pain pattern.  Pt encouraged to f/u with his PMD and Pain Management doctor for good continuity of care and control of his chronic pain. Aware he will not receive narcotic medications rx. Pt verb understanding.    Final  Clinical Impressions(s) / ED Diagnoses   Final diagnoses:  None    New Prescriptions New Prescriptions   No medications on file     Francine Graven, DO 01/21/17 [id number]

## 2017-02-18 ENCOUNTER — Encounter: Payer: Self-pay | Admitting: Orthopaedic Surgery

## 2017-02-18 ENCOUNTER — Ambulatory Visit (INDEPENDENT_AMBULATORY_CARE_PROVIDER_SITE_OTHER): Payer: Medicaid Other | Admitting: Orthopaedic Surgery

## 2017-02-18 VITALS — BP 145/107 | HR 89 | Temp 97.7°F

## 2017-02-18 DIAGNOSIS — F17210 Nicotine dependence, cigarettes, uncomplicated: Secondary | ICD-10-CM | POA: Diagnosis not present

## 2017-02-18 DIAGNOSIS — M25511 Pain in right shoulder: Secondary | ICD-10-CM | POA: Diagnosis not present

## 2017-02-18 DIAGNOSIS — I69354 Hemiplegia and hemiparesis following cerebral infarction affecting left non-dominant side: Secondary | ICD-10-CM

## 2017-02-18 DIAGNOSIS — I10 Essential (primary) hypertension: Secondary | ICD-10-CM | POA: Diagnosis not present

## 2017-02-18 DIAGNOSIS — G8929 Other chronic pain: Secondary | ICD-10-CM

## 2017-02-18 NOTE — Progress Notes (Signed)
Patient Taylor Obrien, male DOB:June 30, 1966, 51 y.o. [id number]  Chief Complaint  Patient presents with  . Follow-up    Chronic right shoulder pain    HPI  Taylor Obrien is a 51 y.o. male who has chronic right shoulder pain.  He is post rotator cuff repair.  He has pain with overhead use.  He has no new trauma, no paresthesias, no swelling.  He has left sided weakness secondary to old CVA.  He is in a wheelchair.  [facility]. HPI  There is no height or weight on file to calculate BMI.  ROS  Review of Systems  HENT: Negative for congestion.   Respiratory: Negative for cough and shortness of breath.   Cardiovascular: Negative for chest pain and leg swelling.  Endocrine: Positive for cold intolerance.  Musculoskeletal: Positive for arthralgias, back pain and gait problem.  Allergic/Immunologic: Positive for environmental allergies.  Neurological: Positive for weakness.    Past Medical History:  Diagnosis Date  . Chronic pain   . Depression   . Essential hypertension, benign   . GERD (gastroesophageal reflux disease)   . HTN (hypertension) 11/27/2015  . Hyperlipidemia   . Stroke [facility])     Past Surgical History:  Procedure Laterality Date  . CLOSED REDUCTION MANDIBULAR FRACTURE W/ ARCH BARS     + multiple extractions  . Condyloma resection    . RADIOLOGY WITH ANESTHESIA N/A 11/18/2015   Procedure: RADIOLOGY WITH ANESTHESIA;  Surgeon: Luanne Bras, MD;  Location: [facility];  Service: Radiology;  Laterality: N/A;  . Removal foreign body right shoulder    . Right rotator cuff repair    . TEE WITHOUT CARDIOVERSION N/A 11/21/2015   Procedure: TRANSESOPHAGEAL ECHOCARDIOGRAM (TEE);  Surgeon: Larey Dresser, MD;  Location: [facility] ENDOSCOPY;  Service: Cardiovascular;  Laterality: N/A;    Family History  Problem Relation Age of Onset  . Diabetes Mother   . Hypertension Mother   . Drug abuse Mother   .  Hyperlipidemia Mother   . Diabetes Father   . Hypertension Father   . Hyperlipidemia Father   . Diabetes Brother   . Hypertension Brother     Social History Social History  Substance Use Topics  . Smoking status: Current Every Day Smoker    Packs/day: 0.50    Years: 30.00    Types: Cigarettes    Last attempt to quit: 11/17/2015  . Smokeless tobacco: Never Used  . Alcohol use No    Allergies  Allergen Reactions  . Oxcarbazepine Other (See Comments)    Patient goes out of right state of mind.     Current Outpatient Prescriptions  Medication Sig Dispense Refill  . acetaminophen (TYLENOL) 500 MG tablet Take 2 tablets (1,000 mg total) by mouth every 8 (eight) hours as needed. 30 tablet 0  . ALPRAZolam (XANAX) 0.25 MG tablet Take 1 tablet (0.25 mg total) by mouth 2 (two) times daily as needed for anxiety (uncontrolled left arm movements). 15 tablet 0  . amLODipine (NORVASC) 10 MG tablet Take 1 tablet (10 mg total) by mouth daily. 30 tablet 0  . aspirin 325 MG tablet Take 1 tablet (325 mg total) by mouth daily. 100 tablet 0  . atorvastatin (LIPITOR) 40 MG tablet Take 1 tablet (40 mg total) by mouth daily at 6 PM. 30 tablet 0  . chlorthalidone (HYGROTON) 50 MG tablet Take 1 tablet (50 mg total) by mouth  daily. 30 tablet 0  . citalopram (CELEXA) 10 MG tablet Take 1 tablet (10 mg total) by mouth daily. 30 tablet 0  . cloNIDine (CATAPRES - DOSED IN MG/24 HR) 0.3 mg/24hr patch Place 1 patch (0.3 mg total) onto the skin once a week. Next change on monday 4 patch 12  . clotrimazole (LOTRIMIN) 1 % cream Apply topically 2 (two) times daily. 60 g 0  . folic acid (FOLVITE) 1 MG tablet Take 1 tablet (1 mg total) by mouth daily. 30 tablet 0  . hydrocerin (EUCERIN) CREA Apply 1 application topically 3 (three) times daily. Available over the counter  0  . HYDROcodone-acetaminophen (NORCO/VICODIN) 5-325 MG tablet Take 1 tablet by mouth every 6 (six) hours as needed for moderate pain. 25 tablet 0  .  labetalol (NORMODYNE) 200 MG tablet Take 1 tablet (200 mg total) by mouth 3 (three) times daily. 90 tablet 0  . methocarbamol (ROBAXIN) 500 MG tablet Take 2 tablets (1,000 mg total) by mouth 4 (four) times daily as needed for muscle spasms (muscle spasm/pain). 25 tablet 0  . Multiple Vitamin (MULTIVITAMIN WITH MINERALS) TABS tablet Take 1 tablet by mouth daily. 100 tablet 1  . pantoprazole (PROTONIX) 40 MG tablet Take 1 tablet (40 mg total) by mouth at bedtime. 30 tablet 0  . potassium chloride SA (K-DUR,KLOR-CON) 20 MEQ tablet Take 1 tablet (20 mEq total) by mouth 2 (two) times daily. 60 tablet 0  . sodium chloride (OCEAN) 0.65 % SOLN nasal spray Place 1 spray into both nostrils as needed for congestion.  0  . Solifenacin Succinate (VESICARE PO) Take by mouth.    Marland Kitchen tiZANidine (ZANAFLEX) 4 MG tablet TAKE (2) TABLETS BY MOUTH AT BEDTIME. 60 tablet 0  . traZODone (DESYREL) 100 MG tablet Take 1 tablet (100 mg total) by mouth at bedtime. 30 tablet 1   No current facility-administered medications for this visit.      Physical Exam  Blood pressure (!) 145/107, pulse 89, temperature 97.7 F (36.5 C).  Constitutional: overall normal hygiene, normal nutrition, well developed, normal grooming, normal body habitus. Assistive device:wheelchair  Musculoskeletal: gait and station Limp not standing, not evaluated, muscle tone and strength are normal on the right, weaker on the left, no tremors or atrophy is present.  .  Neurological: coordination overall normal.  Deep tendon reflex/nerve stretch intact.  Sensation normal.  Cranial nerves II-XII intact.   Skin:   Normal overall no scars, lesions, ulcers or rashes. No psoriasis.  Psychiatric: Alert and oriented x 3.  Recent memory intact, remote memory unclear.  Normal mood and affect. Well groomed.  Good eye contact.  Cardiovascular: overall no swelling, no varicosities, no edema bilaterally, normal temperatures of the legs and arms, no clubbing,  cyanosis and good capillary refill.  Lymphatic: palpation is normal.  Examination of right Upper Extremity is done.  Inspection:   Overall:  Elbow non-tender without crepitus or defects, forearm non-tender without crepitus or defects, wrist non-tender without crepitus or defects, hand non-tender.    Shoulder: with glenohumeral joint tenderness, without effusion.   Upper arm: without swelling and tenderness   Range of motion:   Overall:  Full range of motion of the elbow, full range of motion of wrist and full range of motion in fingers.   Shoulder:  right  145 degrees forward flexion; 120 degrees abduction; 35 degrees internal rotation, 35 degrees external rotation, 15 degrees extension, 40 degrees adduction.   Stability:   Overall:  Shoulder, elbow and wrist  stable   Strength and Tone:   Overall full shoulder muscles strength, full upper arm strength and normal upper arm bulk and tone.   The patient has been educated about the nature of the problem(s) and counseled on treatment options.  The patient appeared to understand what I have discussed and is in agreement with it.  Encounter Diagnoses  Name Primary?  . Chronic right shoulder pain Yes  . Essential hypertension   . Hemiparesis affecting left side as late effect of stroke ([place])   . Cigarette nicotine dependence without complication     PLAN Call if any problems.  Precautions discussed.  Continue current medications.   Return to clinic 3 months   Take Advil for pain.  Electronically Signed Sanjuana Kava, MD 3/13/20182:20 PM

## 2017-02-18 NOTE — Patient Instructions (Signed)
Steps to Quit Smoking Smoking tobacco can be bad for your health. It can also affect almost every organ in your body. Smoking puts you and people around you at risk for many serious [place] Bojanowski-lasting (chronic) diseases. Quitting smoking is hard, but it is one of the best things that you can do for your health. It is never too late to quit. What are the benefits of quitting smoking? When you quit smoking, you lower your risk for getting serious diseases and conditions. They can include:  Lung cancer or lung disease.  Heart disease.  Stroke.  Heart attack.  Not being able to have children (infertility).  Weak bones (osteoporosis) and broken bones (fractures). If you have coughing, wheezing, and shortness of breath, those symptoms may get better when you quit. You may also get sick less often. If you are pregnant, quitting smoking can help to lower your chances of having a baby of low birth weight. What can I do to help me quit smoking? Talk with your doctor about what can help you quit smoking. Some things you can do (strategies) include:  Quitting smoking totally, instead of slowly cutting back how much you smoke over a period of time.  Going to in-person counseling. You are more likely to quit if you go to many counseling sessions.  Using resources and support systems, such as:  Online chats with a counselor.  Phone quitlines.  Printed self-help materials.  Support groups or group counseling.  Text messaging programs.  Mobile phone apps or applications.  Taking medicines. Some of these medicines may have nicotine in them. If you are pregnant or breastfeeding, do not take any medicines to quit smoking unless your doctor says it is okay. Talk with your doctor about counseling or other things that can help you. Talk with your doctor about using more than one strategy at the same time, such as taking medicines while you are also going to in-person counseling. This can help make quitting  easier. What things can I do to make it easier to quit? Quitting smoking might feel very hard at first, but there is a lot that you can do to make it easier. Take these steps:  Talk to your family and friends. Ask them to support and encourage you.  Call phone quitlines, reach out to support groups, or work with a counselor.  Ask people who smoke to not smoke around you.  Avoid places that make you want (trigger) to smoke, such as:  Bars.  Parties.  Smoke-break areas at work.  Spend time with people who do not smoke.  Lower the stress in your life. Stress can make you want to smoke. Try these things to help your stress:  Getting regular exercise.  Deep-breathing exercises.  Yoga.  Meditating.  Doing a body scan. To do this, close your eyes, focus on one area of your body at a time from head to toe, and notice which parts of your body are tense. Try to relax the muscles in those areas.  Download or buy apps on your mobile phone or tablet that can help you stick to your quit plan. There are many free apps, such as QuitGuide from the CDC (Centers for Disease Control and Prevention). You can find more support from smokefree.gov and other websites. This information is not intended to replace advice given to you by your health care provider. Make sure you discuss any questions you have with your health care provider. Document Released: 09/21/2009 Document Revised: 07/23/2016 Document   Reviewed: 04/11/2015 Elsevier Interactive Patient Education  2017 Elsevier Inc.  

## 2017-02-25 IMAGING — CT CT HEAD W/O CM
1 series · 15 of 30 positions shown, 19 images · non-contrast
Comparison: June 07, 2013

CLINICAL DATA: One day history of left-sided weakness

EXAM:
CT HEAD WITHOUT CONTRAST
TECHNIQUE: Contiguous axial images were obtained from the base of the skull
through the vertex without intravenous contrast.

[Series 2: headtrauma 4.8 h37s · axial · 0.53mm/px · z∈[+145,+284]mm · 15 of 30 slices shown, 19 images]
[im 2/30  brain]
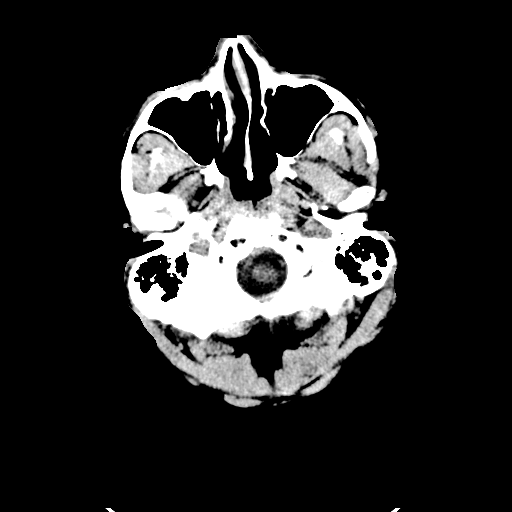
[im 2/30  bone]
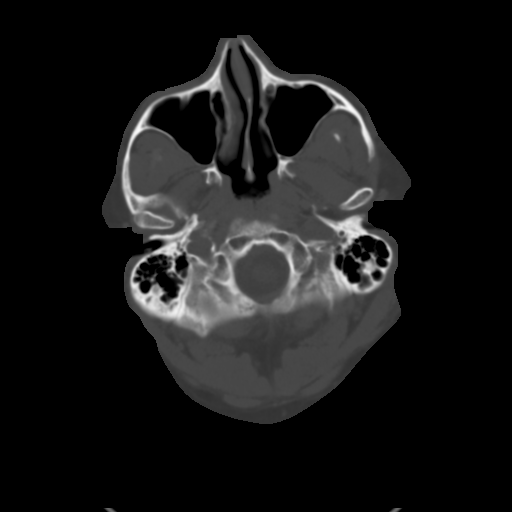
[im 4/30  brain]
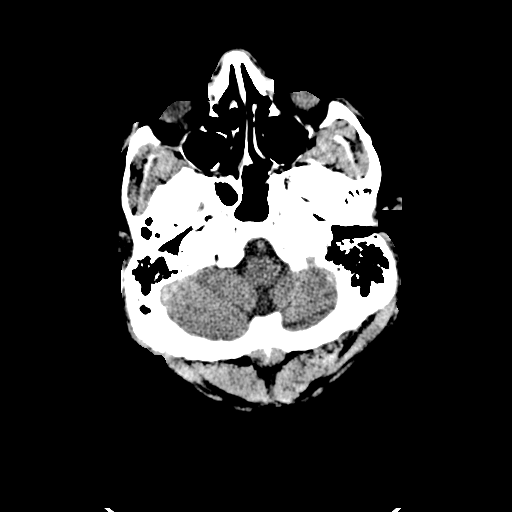
[im 6/30  brain]
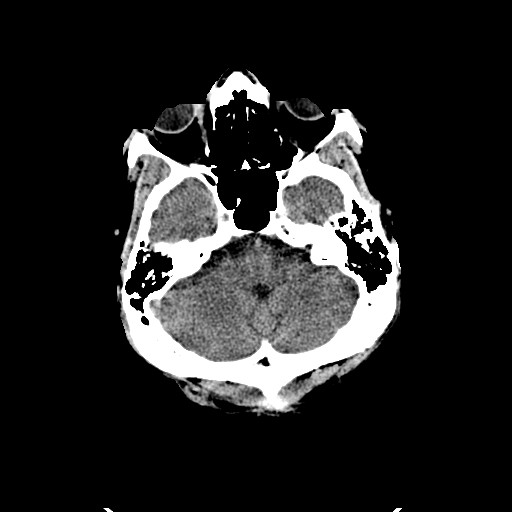
[im 8/30  brain]
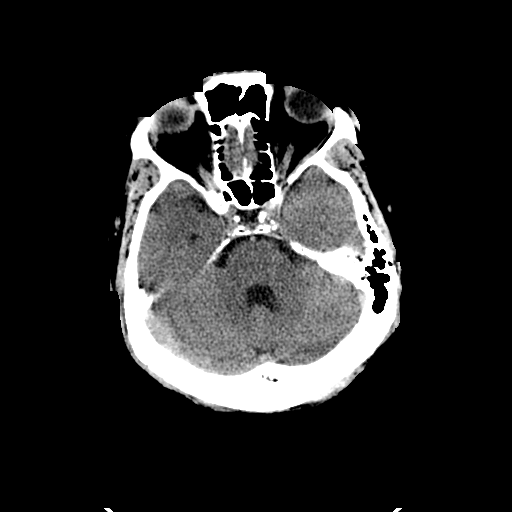
[im 10/30  brain]
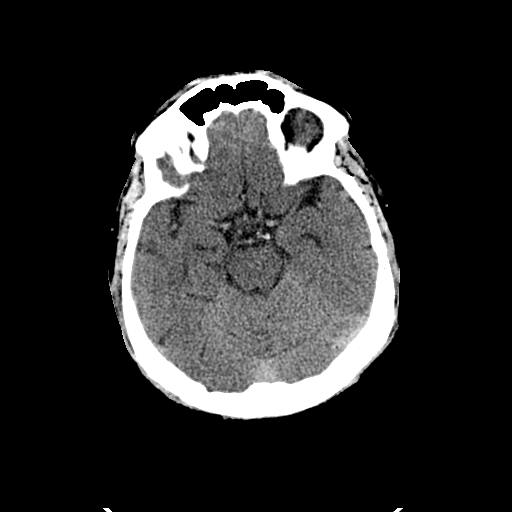
[im 10/30  bone]
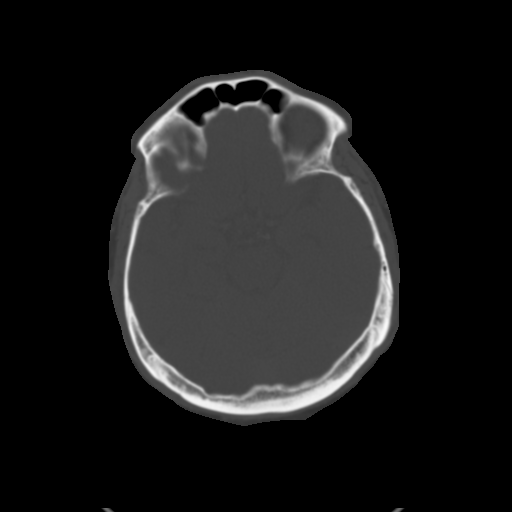
[im 12/30  brain]
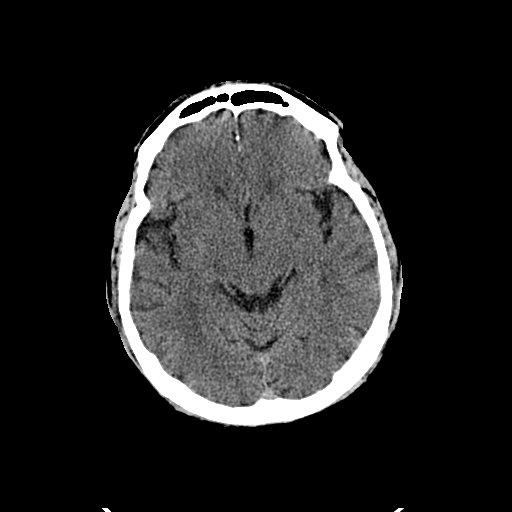
[im 14/30  brain]
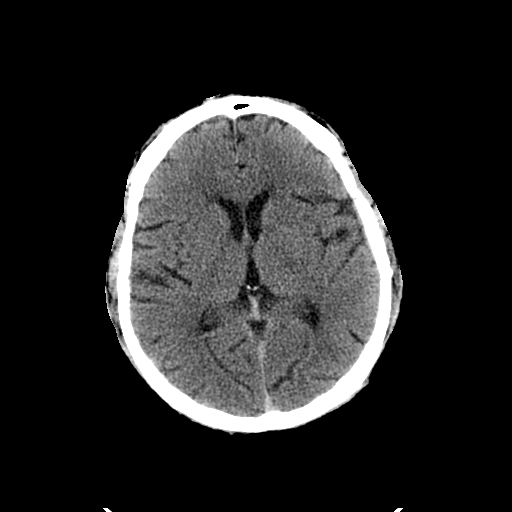
[im 16/30  brain]
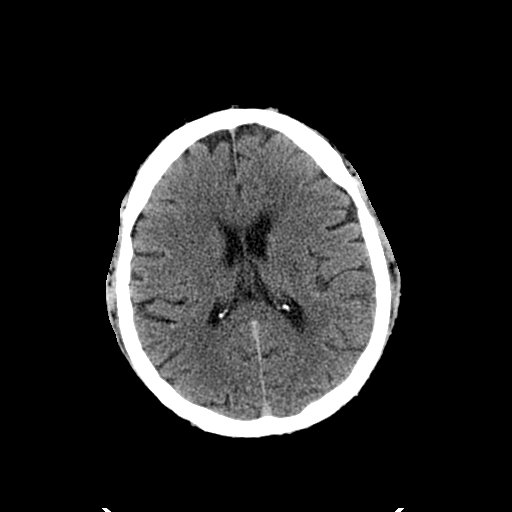
[im 17/30  brain]
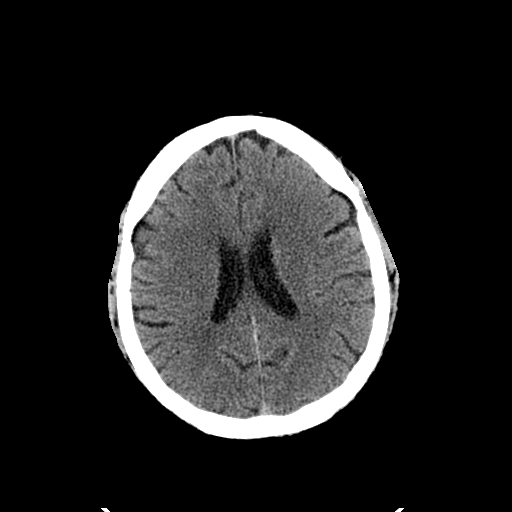
[im 17/30  bone]
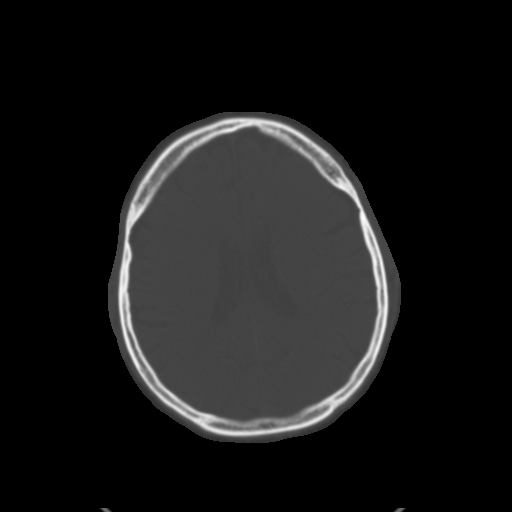
[im 19/30  brain]
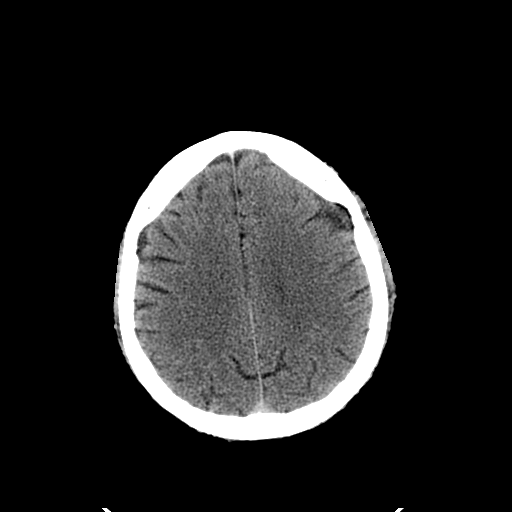
[im 21/30  brain]
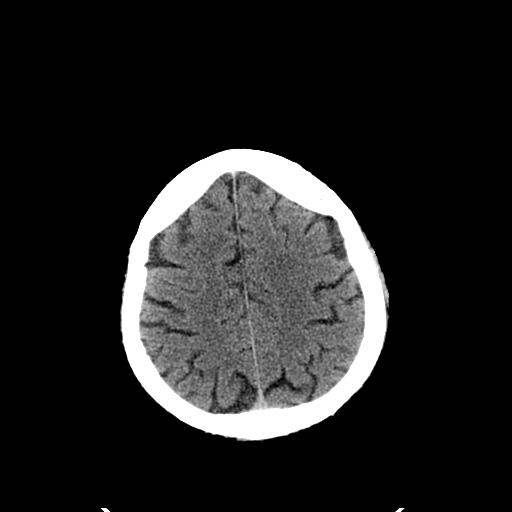
[im 23/30  brain]
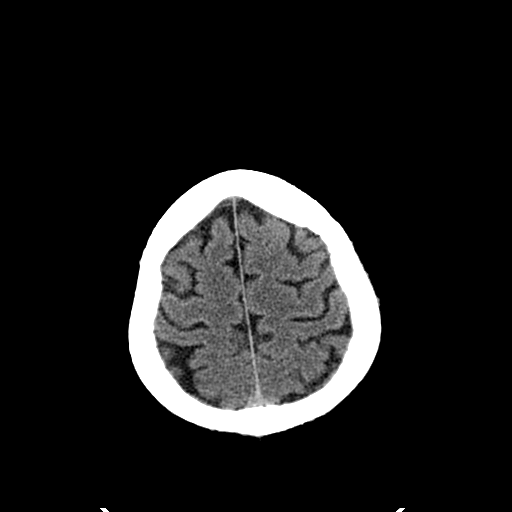
[im 25/30  brain]
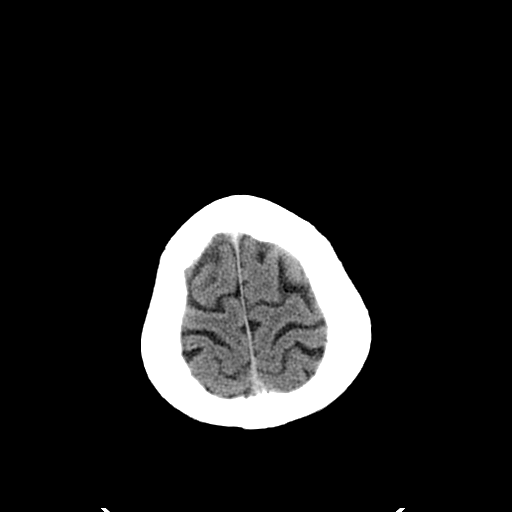
[im 25/30  bone]
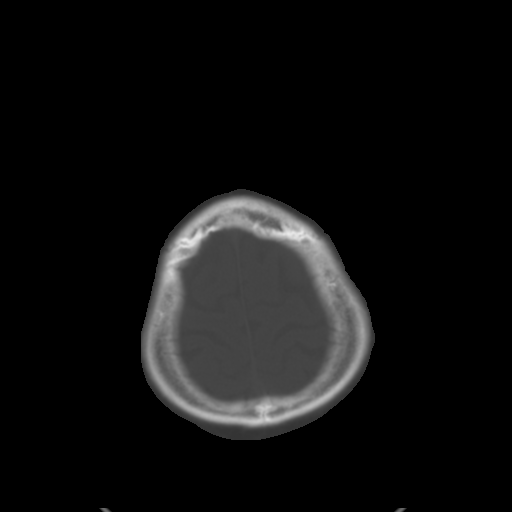
[im 27/30  brain]
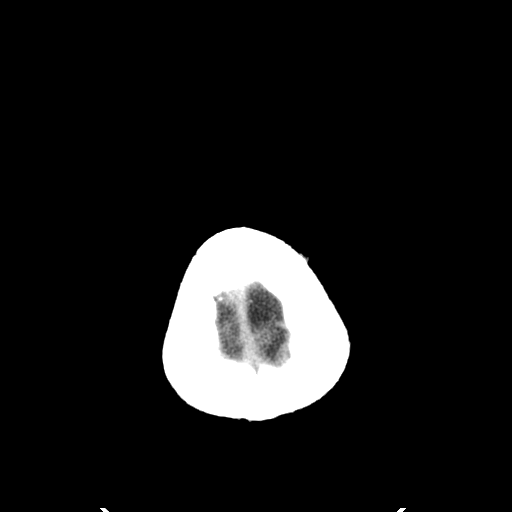
[im 29/30  brain]
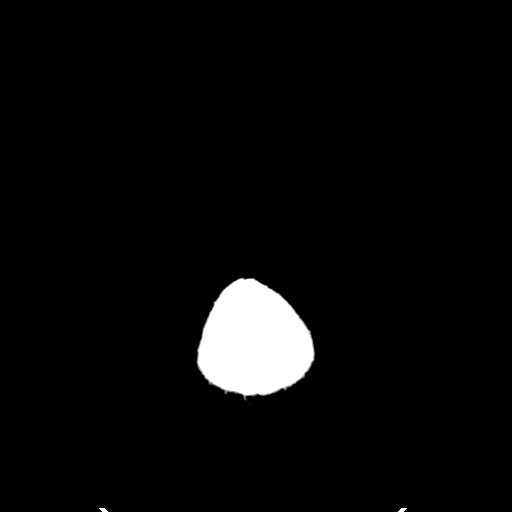

[15 of 30 positions shown; findings below may reference images not displayed]

FINDINGS: The ventricles are normal in size and configuration. There is no
intracranial mass, hemorrhage, extra-axial fluid collection, or
midline shift. There is a small focus of decreased attenuation in
the midline of the upper pons, possibly a small brainstem infarct.
There is minimal small vessel disease adjacent to the frontal horns
of each lateral ventricle. Elsewhere gray-white compartments appear
normal. Bony calvarium appears intact. The visualized mastoid air
cells are clear. No intraorbital lesions are identified.
IMPRESSION: Small focus of decreased attenuation in the midline of the upper
pons, not present previously. A small brainstem infarct must be of
concern given this appearance. Elsewhere gray-white compartments
appear normal except for rather minimal small vessel disease
adjacent to the frontal horns of the lateral ventricles. No edema is
appreciable. No hemorrhage.

Critical Value/emergent results were called by telephone at the time
of interpretation on 11/18/2015 at [DATE] to Dr. CHRISITO BROWNFIELD ,
who verbally acknowledged these results.

## 2017-02-25 IMAGING — CT CT ANGIO NECK
1 of 7 series · 2 of 33 positions shown · IV contrast (Omnipaque 300)
Comparison: Head CT without contrast 5512 hours today. And earlier
Cervical spine MRI 12/16/2008.

ADDENDUM:
Study discussed by telephone with Dr. Worlboss Konadu on
11/18/2015 at 7393 hours. At this time the patient is in route from
[HOSPITAL].
CLINICAL DATA: 44-year-old male code stroke. Left side weakness.
Initial encounter.

EXAM:
CT ANGIOGRAPHY HEAD AND NECK
TECHNIQUE: Multidetector CT imaging of the head and neck was performed using
the standard protocol during bolus administration of intravenous
contrast. Multiplanar CT image reconstructions and MIPs were
obtained to evaluate the vascular anatomy. Carotid stenosis
measurements (when applicable) are obtained utilizing NASCET
criteria, using the distal internal carotid diameter as the
denominator.
CONTRAST:  75mL OMNIPAQUE IOHEXOL 350 MG/ML SOLN

[Series 5: brain angio 2.0 pacs · axial · 0.49mm/px · z∈[+209,+345]mm · 2 of 206 slices shown]
[im 69/206  soft-tissue]
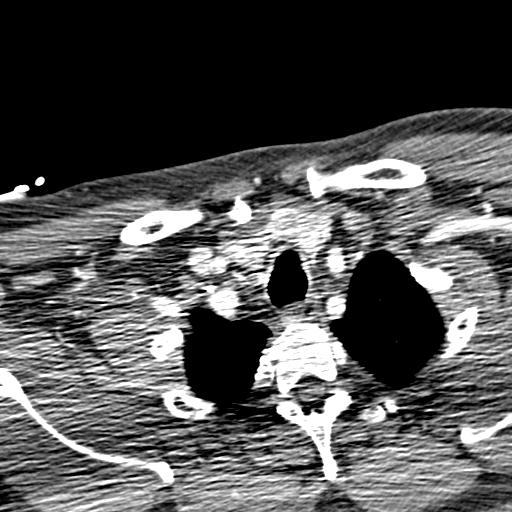
[im 137/206  bone]
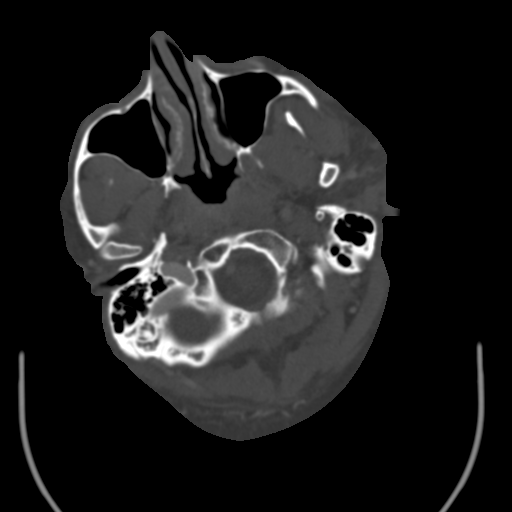

[2 of 33 positions shown; findings below may reference images not displayed]

FINDINGS: CTA NECK

Skeleton: Chronic odontoid fracture or os odontoidium, unchanged
from 9747 No acute osseous abnormality identified. Visualized
paranasal sinuses and mastoids are clear.

Other neck: Cardiomegaly. No pericardial effusion. Negative
visualized lung parenchyma. No superior mediastinal lymphadenopathy.

Streak artifact at the thoracic inlet related to large body habitus.
Thyroid, larynx, pharynx, parapharyngeal spaces, retropharyngeal
space, sublingual space, submandibular glands, and parotid glands
are within normal limits. No cervical lymphadenopathy.

Aortic arch: Suboptimal arterial contrast bolus timing distal to the
common carotid arteries. Negative visualized thoracic aorta. There
is coronary artery calcified plaque and/or stent noted. No great
vessel origin stenosis. Bovine type arch configuration.

Right carotid system: Streak artifact at the thoracic inlet
degrading detail of the proximal right CCA which appears normal.
Suboptimal contrast bolus as the vessel approaches the right carotid
bifurcation. At the bifurcation there is calcified plaque, but no
hemodynamically significant stenosis. Negative cervical right ICA
otherwise.

Left carotid system: Bovine type arch configuration otherwise
negative left CCA origin. Suboptimal contrast bolus beginning just
proximal to the carotid bifurcation. Soft and calcified plaque at
the carotid bifurcation but no proximal left ICA stenosis results.
More distal cervical left ICA appears within normal limits.

Vertebral arteries:No proximal subclavian artery stenosis. Detail of
both vertebral artery origins and V1 segments is degraded by streak
artifact.

Both V2 segments are patent in the neck and appear codominant.

CTA HEAD

Posterior circulation: Suboptimal intracranial contrast, venous
contrast phase is dominant.

Both distal vertebral arteries are patent. beyond the left PICA
origin the right is dominant. No distal vertebral artery stenosis
identified. No definite basilar artery stenosis. Fetal type right
PCA origin. Left PCA origin within normal limits. No proximal PCA
occlusion.

Anterior circulation: Suboptimal intracranial contrast, primarily in
the venous phase.

Moderate calcified siphon plaque. Both ICA siphons remain patent.
Both carotid termini are patent. Both MCA and ACA origins are
patent.

Tortuous proximal ACAs. Both proximal and mid A2 segments are
patent.

Left MCA M1 segment and left MCA bifurcation are patent. Right MCA
M1 segment is patent, but there is asymmetric decreased enhancement
in the anterior right MCA division.

Venous sinuses: Patent.

Anatomic variants: Fetal type right PCA origin.

No changes of acute cortically based infarct are identified on this
study.
IMPRESSION: 1. Limited by venous dominant contrast timing in the distal neck and
head.
2. Anterior division right MCA M2 occlusion strongly suspected. The
right right ICA and MCA M1 segment are patent.
3. Bilateral carotid bifurcation atherosclerosis without
hemodynamically significant stenosis. Negative arch and CCA origins.
4. Vertebral arteries are poorly evaluated in the neck. No posterior
circulation occlusion.

## 2017-03-18 ENCOUNTER — Encounter (HOSPITAL_COMMUNITY): Payer: Self-pay

## 2017-03-18 ENCOUNTER — Encounter (HOSPITAL_COMMUNITY)
Admission: RE | Admit: 2017-03-18 | Discharge: 2017-03-18 | Disposition: A | Discharge status: Home / Self Care | Payer: Medicaid Other | Source: Ambulatory Visit | Attending: Oral Surgery | Admitting: Oral Surgery

## 2017-03-18 ENCOUNTER — Emergency Department (HOSPITAL_COMMUNITY)
Admission: EM | Admit: 2017-03-18 | Discharge: 2017-03-18 | Disposition: A | Discharge status: Home / Self Care | Payer: Medicaid Other | Source: Home / Self Care | Attending: Emergency Medicine | Admitting: Emergency Medicine

## 2017-03-18 ENCOUNTER — Encounter (HOSPITAL_COMMUNITY): Payer: Self-pay | Admitting: Cardiology

## 2017-03-18 ENCOUNTER — Encounter (HOSPITAL_COMMUNITY): Payer: Self-pay | Admitting: Vascular Surgery

## 2017-03-18 DIAGNOSIS — I10 Essential (primary) hypertension: Secondary | ICD-10-CM | POA: Insufficient documentation

## 2017-03-18 DIAGNOSIS — E1165 Type 2 diabetes mellitus with hyperglycemia: Secondary | ICD-10-CM | POA: Diagnosis not present

## 2017-03-18 DIAGNOSIS — Z79899 Other long term (current) drug therapy: Secondary | ICD-10-CM | POA: Diagnosis not present

## 2017-03-18 DIAGNOSIS — Z7982 Long term (current) use of aspirin: Secondary | ICD-10-CM | POA: Diagnosis not present

## 2017-03-18 DIAGNOSIS — E119 Type 2 diabetes mellitus without complications: Secondary | ICD-10-CM

## 2017-03-18 DIAGNOSIS — I517 Cardiomegaly: Secondary | ICD-10-CM | POA: Diagnosis not present

## 2017-03-18 DIAGNOSIS — Z01818 Encounter for other preprocedural examination: Secondary | ICD-10-CM | POA: Diagnosis present

## 2017-03-18 DIAGNOSIS — R9431 Abnormal electrocardiogram [ECG] [EKG]: Secondary | ICD-10-CM | POA: Diagnosis not present

## 2017-03-18 DIAGNOSIS — R350 Frequency of micturition: Secondary | ICD-10-CM | POA: Diagnosis present

## 2017-03-18 DIAGNOSIS — Z7984 Long term (current) use of oral hypoglycemic drugs: Secondary | ICD-10-CM | POA: Insufficient documentation

## 2017-03-18 DIAGNOSIS — K029 Dental caries, unspecified: Secondary | ICD-10-CM | POA: Insufficient documentation

## 2017-03-18 DIAGNOSIS — F17210 Nicotine dependence, cigarettes, uncomplicated: Secondary | ICD-10-CM | POA: Diagnosis not present

## 2017-03-18 DIAGNOSIS — R739 Hyperglycemia, unspecified: Secondary | ICD-10-CM

## 2017-03-18 HISTORY — DX: Unspecified osteoarthritis, unspecified site: M19.90

## 2017-03-18 HISTORY — DX: Sleep apnea, unspecified: G47.30

## 2017-03-18 LAB — CBC
HCT: 46.6 % (ref 39.0–52.0)
HEMOGLOBIN: 16.6 g/dL (ref 13.0–17.0)
MCH: 31.4 pg (ref 26.0–34.0)
MCHC: 35.6 g/dL (ref 30.0–36.0)
MCV: 88.3 fL (ref 78.0–100.0)
PLATELETS: 191 10*3/uL (ref 150–400)
RBC: 5.28 MIL/uL (ref 4.22–5.81)
RDW: 13.1 % (ref 11.5–15.5)
WBC: 5.4 10*3/uL (ref 4.0–10.5)

## 2017-03-18 LAB — CBC WITH DIFFERENTIAL/PLATELET
Basophils Absolute: 0 10*3/uL (ref 0.0–0.1)
Basophils Relative: 0 %
EOS PCT: 1 %
Eosinophils Absolute: 0 10*3/uL (ref 0.0–0.7)
HCT: 47.1 % (ref 39.0–52.0)
HEMOGLOBIN: 17 g/dL (ref 13.0–17.0)
LYMPHS ABS: 1.6 10*3/uL (ref 0.7–4.0)
LYMPHS PCT: 24 %
MCH: 32.1 pg (ref 26.0–34.0)
MCHC: 36.1 g/dL — AB (ref 30.0–36.0)
MCV: 88.9 fL (ref 78.0–100.0)
MONOS PCT: 6 %
Monocytes Absolute: 0.4 10*3/uL (ref 0.1–1.0)
Neutro Abs: 4.8 10*3/uL (ref 1.7–7.7)
Neutrophils Relative %: 70 %
PLATELETS: 202 10*3/uL (ref 150–400)
RBC: 5.3 MIL/uL (ref 4.22–5.81)
RDW: 12.9 % (ref 11.5–15.5)
WBC: 6.9 10*3/uL (ref 4.0–10.5)

## 2017-03-18 LAB — COMPREHENSIVE METABOLIC PANEL
ALBUMIN: 3.8 g/dL (ref 3.5–5.0)
ALK PHOS: 70 U/L (ref 38–126)
ALT: 29 U/L (ref 17–63)
AST: 29 U/L (ref 15–41)
Anion gap: 13 (ref 5–15)
BUN: 8 mg/dL (ref 6–20)
CALCIUM: 9.6 mg/dL (ref 8.9–10.3)
CHLORIDE: 89 mmol/L — AB (ref 101–111)
CO2: 28 mmol/L (ref 22–32)
CREATININE: 1.25 mg/dL — AB (ref 0.61–1.24)
GFR calc non Af Amer: >60 mL/min (ref >60)
GLUCOSE: 559 mg/dL — AB (ref 65–99)
Potassium: 3.5 mmol/L (ref 3.5–5.1)
SODIUM: 130 mmol/L — AB (ref 135–145)
Total Bilirubin: 0.8 mg/dL (ref 0.3–1.2)
Total Protein: 7.1 g/dL (ref 6.5–8.1)

## 2017-03-18 LAB — BASIC METABOLIC PANEL
Anion gap: 13 (ref 5–15)
BUN: 12 mg/dL (ref 6–20)
CHLORIDE: 89 mmol/L — AB (ref 101–111)
CO2: 29 mmol/L (ref 22–32)
CREATININE: 1.22 mg/dL (ref 0.61–1.24)
Calcium: 10 mg/dL (ref 8.9–10.3)
GFR calc Af Amer: >60 mL/min (ref >60)
GFR calc non Af Amer: >60 mL/min (ref >60)
GLUCOSE: 497 mg/dL — AB (ref 65–99)
POTASSIUM: 3.3 mmol/L — AB (ref 3.5–5.1)
Sodium: 131 mmol/L — ABNORMAL LOW (ref 135–145)

## 2017-03-18 LAB — URINALYSIS, ROUTINE W REFLEX MICROSCOPIC
Bacteria, UA: NONE SEEN
Bilirubin Urine: NEGATIVE
Glucose, UA: >=500 mg/dL — AB
Ketones, ur: NEGATIVE mg/dL
Leukocytes, UA: NEGATIVE
Nitrite: NEGATIVE
PROTEIN: 100 mg/dL — AB
Specific Gravity, Urine: 1.032 — ABNORMAL HIGH (ref 1.005–1.030)
pH: 5 (ref 5.0–8.0)

## 2017-03-18 LAB — CBG MONITORING, ED
GLUCOSE-CAPILLARY: 413 mg/dL — AB (ref 65–99)
Glucose-Capillary: 552 mg/dL (ref 65–99)
Glucose-Capillary: 385 mg/dL — ABNORMAL HIGH (ref 65–99)

## 2017-03-18 MED ORDER — SODIUM CHLORIDE 0.9 % IV BOLUS (SEPSIS)
1000.0000 mL | Freq: Once | INTRAVENOUS | Status: AC
  Administered 2017-03-18: 1000 mL via INTRAVENOUS

## 2017-03-18 MED ORDER — GLUCOSE BLOOD VI STRP: ORAL_STRIP | 12 refills | Status: DC

## 2017-03-18 MED ORDER — ACETAMINOPHEN 500 MG PO TABS
1000.0000 mg | ORAL_TABLET | Freq: Once | ORAL | Status: AC
  Administered 2017-03-18: 1000 mg via ORAL
  Filled 2017-03-18: qty 2

## 2017-03-18 MED ORDER — METFORMIN HCL 500 MG PO TABS: 500.0000 mg | ORAL_TABLET | Freq: Two times a day (BID) | ORAL | 3 refills | Status: DC

## 2017-03-18 MED ORDER — BLOOD GLUCOSE MONITOR KIT: PACK | 0 refills | Status: DC

## 2017-03-18 NOTE — Progress Notes (Signed)
PATIENT STATED HE THINKS HE COULD HAVE AN ABCESS TOOTH (HAS TINGLING AT GUM).  ENCOURAGED PATIENT TO CALL DR. Hoyt Koch TODAY AND NOTIFY HIM OF POSSIBLE ABCESS AND ASK IF HE NEEDS TO STOP ASPIRIN .

## 2017-03-18 NOTE — Progress Notes (Signed)
[facility] IN [facility] CALLED WITH CRITICAL GLUCOSE OF 559.  NOTIFIED ALLISON ZELENAK OF CRITICAL LAB.

## 2017-03-18 NOTE — ED Notes (Signed)
Per Oral surgeon, states recently diagnosed with diabetes-has not seen MD for treatment-was suppose to have some extractions but CBG was over 500

## 2017-03-18 NOTE — Pre-Procedure Instructions (Deleted)
Malak Orantes Hinnenkamp  03/18/2017      [facility] - [place], [place] [address] Phone: [phone] Fax: [phone]    Your procedure is scheduled on Fri, April 13 @ 8:30 AM  Report to [facility] Admitting at 6:30 AM  Call this number if you have problems the morning of surgery:  [phone]   Remember:  Do not eat food or drink liquids after midnight.  Take these medicines the morning of surgery with A SIP OF WATER Amlodipine(Norvasc),Escitalopram(Lexapro),Pain Pill(if needed), and Labetalol(Normodyne)             Stop taking your Multivitamin. No Goody's,BC's,Aleve,Advil,Motrin,Ibuprofen,Fish Oil,or any Herbal Medications.    Do not wear jewelry.  Do not wear lotions, powders,colognes, or deoderant.  Men may shave face and neck.  Do not bring valuables to the hospital.  [facility] is not responsible for any belongings or valuables.  Contacts, dentures or bridgework may not be worn into surgery.  Leave your suitcase in the car.  After surgery it may be brought to your room.  For patients admitted to the hospital, discharge time will be determined by your treatment team.  Patients discharged the day of surgery will not be allowed to drive home.    [facility]  Before surgery, you can play an important role.  Because skin is not sterile, your skin needs to be as free of germs as possible.  You can reduce the number of germs on you skin by washing with CHG (chlorahexidine gluconate) soap before surgery.  CHG is an antiseptic cleaner which kills germs and bonds with the skin to continue killing germs even after washing.  Please DO NOT use if you have an allergy to CHG or antibacterial soaps.  If your skin becomes reddened/irritated stop using the CHG and inform your nurse when you arrive at Short Stay.  Do not shave (including legs and underarms) for at least 48 hours prior to  the first CHG shower.  You may shave your face.  Please follow these instructions carefully:   1.  Shower with CHG Soap the night before surgery and the                                morning of Surgery.  2.  If you choose to wash your hair, wash your hair first as usual with your       normal shampoo.  3.  After you shampoo, rinse your hair and body thoroughly to remove the                      Shampoo.  4.  Use CHG as you would any other liquid soap.  You can apply chg directly       to the skin and wash gently with scrungie or a clean washcloth.  5.  Apply the CHG Soap to your body ONLY FROM THE NECK DOWN.        Do not use on open wounds or open sores.  Avoid contact with your eyes,       ears, mouth and genitals (private parts).  Wash genitals (private parts)       with your normal soap.  6.  Wash thoroughly, paying special attention to the area where your surgery  will be performed.  7.  Thoroughly rinse your body with warm water from the neck down.  8.  DO NOT shower/wash with your normal soap after using and rinsing off       the CHG Soap.  9.  Pat yourself dry with a clean towel.            10.  Wear clean pajamas.            11.  Place clean sheets on your bed the night of your first shower and do not        sleep with pets.  Day of Surgery  Do not apply any lotions/deoderants the morning of surgery.  Please wear clean clothes to the hospital/surgery center.   Please read over the following fact sheets that you were given. Coughing and Deep Breathing

## 2017-03-18 NOTE — Pre-Procedure Instructions (Signed)
Payam Gribble Nanda  03/18/2017      [facility] - [place], [place] [address] Phone: [phone] Fax: [phone]    Your procedure is scheduled on Fri, April 13 @ 8:30 AM  Report to [facility] Admitting at 6:30 AM  Call this number if you have problems the morning of surgery:  [phone]   Remember:  Do not eat food or drink liquids after midnight.  Take these medicines the morning of surgery with A SIP OF WATER Amlodipine(Norvasc),Escitalopram(Lexapro),Pain Pill(if needed), and Labetalol(Normodyne)             Stop taking your Multivitamin. No Goody's,BC's,Aleve,Advil,Motrin,Ibuprofen,Fish Oil,or any Herbal Medications.    Do not wear jewelry.  Do not wear lotions, powders,colognes, or deoderant.  Men may shave face and neck.  Do not bring valuables to the hospital.  [facility] is not responsible for any belongings or valuables.  Contacts, dentures or bridgework may not be worn into surgery.  Leave your suitcase in the car.  After surgery it may be brought to your room.  For patients admitted to the hospital, discharge time will be determined by your treatment team.  Patients discharged the day of surgery will not be allowed to drive home.    [facility]  Before surgery, you can play an important role.  Because skin is not sterile, your skin needs to be as free of germs as possible.  You can reduce the number of germs on you skin by washing with CHG (chlorahexidine gluconate) soap before surgery.  CHG is an antiseptic cleaner which kills germs and bonds with the skin to continue killing germs even after washing.  Please DO NOT use if you have an allergy to CHG or antibacterial soaps.  If your skin becomes reddened/irritated stop using the CHG and inform your nurse when you arrive at Short Stay.  Do not shave (including legs and underarms) for at least 48 hours prior to  the first CHG shower.  You may shave your face.  Please follow these instructions carefully:   1.  Shower with CHG Soap the night before surgery and the                                morning of Surgery.  2.  If you choose to wash your hair, wash your hair first as usual with your       normal shampoo.  3.  After you shampoo, rinse your hair and body thoroughly to remove the                      Shampoo.  4.  Use CHG as you would any other liquid soap.  You can apply chg directly       to the skin and wash gently with scrungie or a clean washcloth.  5.  Apply the CHG Soap to your body ONLY FROM THE NECK DOWN.        Do not use on open wounds or open sores.  Avoid contact with your eyes,       ears, mouth and genitals (private parts).  Wash genitals (private parts)       with your normal soap.  6.  Wash thoroughly, paying special attention to the area where your surgery  will be performed.  7.  Thoroughly rinse your body with warm water from the neck down.  8.  DO NOT shower/wash with your normal soap after using and rinsing off       the CHG Soap.  9.  Pat yourself dry with a clean towel.            10.  Wear clean pajamas.            11.  Place clean sheets on your bed the night of your first shower and do not        sleep with pets.  Day of Surgery  Do not apply any lotions/deoderants the morning of surgery.  Please wear clean clothes to the hospital/surgery center.   Please read over the following fact sheets that you were given. Coughing and Deep Breathing

## 2017-03-18 NOTE — Progress Notes (Signed)
Anesthesia Chart Review: Patient is a 51 year old male scheduled for multiple teeth extractions with alveoloplasty on 03/21/17 by Dr. Diona Browner. PAT was this morning. He lives in [place].  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 He denied history of diabetes. BMI is consistent with obesity.  - PCP reported as "Dr. Quentin Cornwall" at the [facility] [phone]). However, he is due to get his Medicaid care any day now and will be seeing "Dr. Merlene Laughter." (Wife reports this is a primary care physician, but the only Dr. Merlene Laughter I know in [place] is a neurologist.) - He is not currently followed by a cardiologist, but was seen by Dr. Rozann Lesches on 08/31/12 for evaluation following admission for chest pain (he signed out AMA). Echo was ordered at that time and decision for additional testing would be based on results and HTN control.  Medical therapy was recommended following 09/02/12 echo. Patient has not been seen by Dr. Domenic Polite since. He did see Dr. Rayann Heman at the time of his 2016 CVA regarding consideration of a loop recorder. He wrote, " At this time, the patient is very clear in their decision to proceed with 30 day monitor. Will order and follow up in the office in 6 weeks to review results. If no AF detected, would recommend ILR at that time." No arrhythmias was found on event monitor. Patient has not been back to see Dr. Rayann Heman.  - Neurologist is Dr. Antony Contras, last visit 03/19/16.  Meds include amlodipine, aspirin 325 mg, Lipitor, chlorthalidone, Catapres, Lexapro, folic acid, labetalol, Protonix, KCl, Zanaflex, trazodone.  BP 126/90   Pulse 83   Temp 37.3 C   Resp 20   Ht 5\' 5"  (1.651 m)   Wt 230 lb (104.3 kg)   SpO2 98%   BMI 38.27 kg/m   EKG 03/18/17: NSR, RAE, LVH  with repolarization abnormality (ST elevation V1-2, T wave inversion anterolateral leads, non-specific T wave abnormality in inferior leads), inferior infarct (age undetermined). Overall, EKG tracings are similar to prior on 04/01/11 and 12/14/15.   12/2015 30 day Event monitor: Study Highlights Sinus rhythm with rare premature ventricular contactions No pauses or significant bradycardic episodes No sustained arrhythmias  TEE 11/21/15: Study Conclusions - Left ventricle: The cavity size was normal. Wall thickness was   increased in a pattern of moderate to severe LVH. Systolic   function was normal. The estimated ejection fraction was in the   range of 55% to 60%. Wall motion was normal; there were no   regional wall motion abnormalities. - Aortic valve: There was no stenosis. - Aorta: Normal caliber aorta with minimal plaque. - Mitral valve: There was no significant regurgitation. - Left atrium: The atrium was mildly dilated. No evidence of   thrombus in the atrial cavity or appendage. - Right ventricle: The cavity size was normal. Systolic function   was normal. - Right atrium: No evidence of thrombus in the atrial cavity or   appendage. - Atrial septum: No defect or patent foramen ovale was identified.   Echo contrast study showed no right-to-left atrial level shunt,   at baseline or with provocation. Impressions: - No cardiac source of emboli was indentified.  Echo (TTE) 11/20/15: Study Conclusions - Left ventricle: The cavity size was normal. There was moderate   concentric hypertrophy. Systolic function was normal. The  estimated ejection fraction was in the range of 55% to 60%. There   is akinesis of the basalinferior myocardium. Features are   consistent with a pseudonormal left ventricular filling pattern,   with concomitant abnormal relaxation and increased filling   pressure (grade 2 diastolic dysfunction). Doppler parameters are   consistent with high ventricular filling  pressure. - Left atrium: The atrium was severely dilated.  Renal artery Duplex 12/01/15: Summary: - No evidence of renal artery stenosis noted bilaterally. - Bilateral normal intrarenal resistive indices.  CTA head/neck 11/18/15: IMPRESSION: 1. Limited by venous dominant contrast timing in the distal neck and head. 2. Anterior division right MCA M2 occlusion strongly suspected. The right right ICA and MCA M1 segment are patent. 3. Bilateral carotid bifurcation atherosclerosis without hemodynamically significant stenosis. Negative arch and CCA origins. 4. Vertebral arteries are poorly evaluated in the neck. No posterior circulation occlusion.  Preoperative labs noted. Na 130, K 3.5, CO2 28, Glucose 559, anion gap 13. LFTs WNL. Cr 1.25, down from 1.82 on 12/20/15. CBC WNL.   I called and spoke with patient around 11:30 AM. He was at home with his wife. He reported excessive thirst, dry mouth, frequent urination. Some blurred vision, but unsure if related to hyperglycemia or not (reports he needs reading glasses). No N/V or abdominal pain. No confusion. Denied ETOH. He has a family history of DM, but denied known issues himself until today's call. Denied any recent steroids. He does have tingling in a left upper tooth and was concerned about possible abscess. He denied CP and SOB. No edema. He is primary wheelchair bound, but can transfer independently. He does do some leg exercises.   I advised patient and wife that he would need to go to the ED for further management of his hyperglycemia. (I also notified [facility] ED charge RN.) His glucose of 559 was non-fasting, but discussed that he likely has undiagnosed diabetes that will need on-going management. We discussed that unless there was an acute tooth abscess that needed urgent surgery that his oral surgery would likely be postponed until he was re-evaluated by a medical provider and cleared for surgery once glucose control had improved. I had also  reviewed history and EKGs with anesthesiologist Dr. Ermalene Postin. He agreed with medical clearance for dental surgery if patient without acute CV symptoms. I have left a voice message this morning for Dr. Lupita Leash staff to contact me to review labs.   [facility] Short Stay Center/Anesthesiology Phone [phone] 03/18/2017 1:53 PM

## 2017-03-18 NOTE — Discharge Instructions (Signed)
You have new onset diabetes.  Take metformin as prescribed, but you will need to see the health department or your PCP in 1 week for follow-up as often some people need to have medications changes.   You do look like you have blood in the urine. You will eventually need to follow-up with urology for work-up. Follow-up information is given.  Please return for worsening symptoms, including fever, confusion, intractable vomiting, difficulty breathing, severe abdominal pain or any other symptoms concerning to you.

## 2017-03-18 NOTE — ED Triage Notes (Signed)
Had blood work done at [facility] this morning for pre-op and was called and told to come to the ER for glucose 559.

## 2017-03-18 NOTE — ED Provider Notes (Signed)
[facility] DEPT Provider Note   CSN: [id number] Arrival date & time: 03/18/17  1232   By signing my name below, I, Eunice Blase, attest that this documentation has been prepared under the direction and in the presence of Forde Dandy, MD. Electronically signed, Eunice Blase, ED Scribe. 03/18/17. 2:16 PM.   History   Chief Complaint Chief Complaint  Patient presents with  . Hyperglycemia   The history is provided by the patient, the spouse and medical records. No language interpreter was used.  Hyperglycemia  Blood sugar level PTA:  559 Onset quality:  Unable to specify Timing:  Unable to specify Progression:  Improving Chronicity:  New Diabetes status:  Non-diabetic Context: new diabetes diagnosis   Context: not change in medication, not recent change in diet and not recent illness   Relieved by:  None tried Ineffective treatments:  None tried Associated symptoms: dehydration, increased thirst and weight change   Associated symptoms: no altered mental status, no chest pain, no confusion, no fatigue, no increased appetite, no malaise, no nausea, no shortness of breath, no vomiting and no weakness   Risk factors: obesity     Taylor Obrien is a 51 y.o. male with h/o HTN, GERD, Tobacco use, CVA, HLD, HKI and alcohol abuse transported by his wife to the Emergency Department with concern for low blood sugar readings (559) measured at [facility] PTA. He notes associated dry mouth, urinary frequency, decreased appetite, weight loss, worsened chronic 7/10 low back pain. He states his back pain is improved with tylenol and worsened with certain movements. He denies fatigue, chest pain, cough, SOB, abdominal pain, N/V/D, weakness. Past Medical History:  Diagnosis Date  . Arthritis   . Chronic pain   . Depression   . Essential hypertension, benign   . GERD (gastroesophageal reflux disease)   . HTN (hypertension) 11/27/2015  . Hyperlipidemia   . Sleep apnea    cpap   . Stroke [facility])    2016    Patient Active Problem List   Diagnosis Date Noted  . Spastic hemiplegia affecting nondominant side ([place]) 06/03/2016  . Dysuria   . OSA (obstructive sleep apnea)   . Hypokalemia   . Elevated blood pressure   . Hemiparesis affecting left side as late effect of stroke ([place])   . Epistaxis   . HTN (hypertension) 11/27/2015  . AKI (acute kidney injury) ([place]) 11/27/2015  . Hemiplegia and hemiparesis following unspecified cerebrovascular disease affecting left non-dominant side ([place]) 11/23/2015  . Gait disturbance, post-stroke 11/23/2015  . Stroke due to occlusion of right anterior cerebral artery ([place]) 11/23/2015  . Cerebrovascular accident (CVA) due to occlusion of right anterior cerebral artery ([place])   . Essential hypertension   . Depression   . Chronic pain syndrome   . ETOH abuse   . Marijuana abuse   . Cerebrovascular accident (CVA) due to thrombosis of right carotid artery ([place])   . Malignant hypertension   . Hyperlipidemia   . CVA (cerebral infarction) 11/18/2015  . Stroke (cerebrum) ([place]) 11/18/2015  . Chest pain 10/09/2012  . GERD (gastroesophageal reflux disease) 10/09/2012  . Chronic back pain 10/09/2012  . Tobacco abuse 10/09/2012  . Hypertensive heart disease 08/31/2012  . Accelerated hypertension 08/31/2012  . Precordial pain 08/31/2012    Past Surgical History:  Procedure Laterality Date  . CLOSED REDUCTION MANDIBULAR FRACTURE W/ ARCH BARS     + multiple extractions  . Condyloma resection    . RADIOLOGY WITH ANESTHESIA N/A 11/18/2015   Procedure:  RADIOLOGY WITH ANESTHESIA;  Surgeon: Luanne Bras, MD;  Location: [place];  Service: Radiology;  Laterality: N/A;  . Removal foreign body right shoulder    . Right rotator cuff repair    . TEE WITHOUT CARDIOVERSION N/A 11/21/2015   Procedure: TRANSESOPHAGEAL ECHOCARDIOGRAM (TEE);  Surgeon: Larey Dresser, MD;  Location: [place];  Service: Cardiovascular;  Laterality: N/A;       Home Medications      Prior to Admission medications   Medication Sig Start Date End Date Taking? Authorizing Provider  acetaminophen (TYLENOL) 500 MG tablet Take 2 tablets (1,000 mg total) by mouth every 8 (eight) hours as needed. Patient taking differently: Take 500 mg by mouth every 8 (eight) hours as needed for mild pain.  12/20/15  Yes Ivan Anchors Love, PA-C  amLODipine (NORVASC) 10 MG tablet Take 1 tablet (10 mg total) by mouth daily. 01/19/16  Yes Charlett Blake, MD  aspirin 325 MG tablet Take 1 tablet (325 mg total) by mouth daily. 12/20/15  Yes Ivan Anchors Love, PA-C  atorvastatin (LIPITOR) 40 MG tablet Take 1 tablet (40 mg total) by mouth daily at 6 PM. 01/19/16  Yes Charlett Blake, MD  chlorthalidone (HYGROTON) 50 MG tablet Take 1 tablet (50 mg total) by mouth daily. Patient taking differently: Take 50 mg by mouth every evening.  01/19/16  Yes Charlett Blake, MD  cloNIDine (CATAPRES - DOSED IN MG/24 HR) 0.3 mg/24hr patch Place 1 patch (0.3 mg total) onto the skin once a week. Next change on monday 12/20/15  Yes Ivan Anchors Love, PA-C  clotrimazole (LOTRIMIN) 1 % cream Apply topically 2 (two) times daily. 01/19/16  Yes Charlett Blake, MD  diphenhydramine-acetaminophen (TYLENOL PM) 25-500 MG TABS tablet Take 1-2 tablets by mouth at bedtime as needed (for sleep). Depends on insomnia if takes 1-2 tablets   Yes Historical Provider, MD  escitalopram (LEXAPRO) 10 MG tablet Take 10 mg by mouth daily.   Yes Historical Provider, MD  folic acid (FOLVITE) 1 MG tablet Take 1 tablet (1 mg total) by mouth daily. 12/20/15  Yes Ivan Anchors Love, PA-C  hydrocerin (EUCERIN) CREA Apply 1 application topically 3 (three) times daily. Available over the counter Patient taking differently: Apply 1 application topically 3 (three) times daily as needed (dry skin). Available over the counter 12/20/15  Yes Ivan Anchors Love, PA-C  labetalol (NORMODYNE) 200 MG tablet Take 1 tablet (200 mg total) by mouth 3 (three) times daily. 01/19/16  Yes  Charlett Blake, MD  Multiple Vitamin (MULTIVITAMIN WITH MINERALS) TABS tablet Take 1 tablet by mouth daily. 12/20/15  Yes Ivan Anchors Love, PA-C  pantoprazole (PROTONIX) 40 MG tablet Take 1 tablet (40 mg total) by mouth at bedtime. 01/19/16  Yes Charlett Blake, MD  potassium chloride SA (K-DUR,KLOR-CON) 20 MEQ tablet Take 1 tablet (20 mEq total) by mouth 2 (two) times daily. 01/19/16  Yes Charlett Blake, MD  sodium chloride (OCEAN) 0.65 % SOLN nasal spray Place 1 spray into both nostrils as needed for congestion. 12/20/15  Yes Ivan Anchors Love, PA-C  tiZANidine (ZANAFLEX) 4 MG tablet TAKE (2) TABLETS BY MOUTH AT BEDTIME. 06/13/16  Yes Charlett Blake, MD  traZODone (DESYREL) 100 MG tablet Take 1 tablet (100 mg total) by mouth at bedtime. 01/19/16  Yes Charlett Blake, MD  ALPRAZolam Duanne Moron) 0.25 MG tablet Take 1 tablet (0.25 mg total) by mouth 2 (two) times daily as needed for anxiety (uncontrolled left arm movements). Patient not  taking: Reported on 03/12/2017 12/20/15   Ivan Anchors Love, PA-C  blood glucose meter kit and supplies KIT Dispense based on patient and insurance preference. Use up to four times daily as directed. (FOR ICD-9 250.00, 250.01). 03/18/17   Forde Dandy, MD  citalopram (CELEXA) 10 MG tablet Take 1 tablet (10 mg total) by mouth daily. Patient not taking: Reported on 03/12/2017 01/19/16   Charlett Blake, MD  glucose blood test strip Use as instructed 03/18/17   Forde Dandy, MD  HYDROcodone-acetaminophen (NORCO/VICODIN) 5-325 MG tablet Take 1 tablet by mouth every 6 (six) hours as needed for moderate pain. Patient not taking: Reported on 03/12/2017 12/19/16   Carole Civil, MD  metFORMIN (GLUCOPHAGE) 500 MG tablet Take 1 tablet (500 mg total) by mouth 2 (two) times daily with a meal. 03/18/17   Forde Dandy, MD  methocarbamol (ROBAXIN) 500 MG tablet Take 2 tablets (1,000 mg total) by mouth 4 (four) times daily as needed for muscle spasms (muscle spasm/pain). Patient not taking:  Reported on 03/12/2017 01/17/17   Francine Graven, DO    Family History Family History  Problem Relation Age of Onset  . Diabetes Mother   . Hypertension Mother   . Drug abuse Mother   . Hyperlipidemia Mother   . Diabetes Father   . Hypertension Father   . Hyperlipidemia Father   . Diabetes Brother   . Hypertension Brother     Social History Social History  Substance Use Topics  . Smoking status: Current Every Day Smoker    Packs/day: 0.50    Years: 30.00    Types: Cigarettes    Last attempt to quit: 11/17/2015  . Smokeless tobacco: Never Used  . Alcohol use No     Allergies   Oxcarbazepine   Review of Systems Review of Systems  Constitutional: Positive for appetite change and unexpected weight change. Negative for fatigue.  Respiratory: Negative for cough and shortness of breath.   Cardiovascular: Negative for chest pain.  Gastrointestinal: Negative for diarrhea, nausea and vomiting.  Endocrine: Positive for polydipsia.  Genitourinary: Positive for frequency.  Musculoskeletal: Positive for back pain.  Neurological: Negative for weakness.  Psychiatric/Behavioral: Negative for confusion.    Physical Exam Updated Vital Signs BP 128/83   Pulse 89   Temp 98.7 F (37.1 C) (Oral)   Resp 20   Ht 5' 5"  (1.651 m)   Wt 230 lb (104.3 kg)   SpO2 97%   BMI 38.27 kg/m   Physical Exam Physical Exam  Nursing note and vitals reviewed. Constitutional: Well developed, well nourished, non-toxic, and in no acute distress Head: Normocephalic and atraumatic.  Mouth/Throat: Oropharynx is clear and moist.  Neck: Normal range of motion. Neck supple.  Cardiovascular: Normal rate and regular rhythm.   Pulmonary/Chest: Effort normal and breath sounds normal.  Abdominal: Soft. There is no tenderness. There is no rebound and no guarding.  Musculoskeletal: Normal range of motion.  Neurological: Alert, no facial droop, fluent speech, moves all extremities symmetrically Skin: Skin  is warm and dry.  Psychiatric: Cooperative   ED Treatments / Results  DIAGNOSTIC STUDIES: Oxygen Saturation is 97% on RA, normal by my interpretation.    COORDINATION OF CARE: 1:53 PM Discussed treatment plan with pt at bedside and pt agreed to plan. Will order medications and labs. Pt advised to F/U with PCP for blood sugar recheck.  Labs (all labs ordered are listed, but only abnormal results are displayed) Labs Reviewed  CBC WITH DIFFERENTIAL/PLATELET -  Abnormal; Notable for the following:       Result Value   MCHC 36.1 (*)    All other components within normal limits  BASIC METABOLIC PANEL - Abnormal; Notable for the following:    Sodium 131 (*)    Potassium 3.3 (*)    Chloride 89 (*)    Glucose, Bld 497 (*)    All other components within normal limits  URINALYSIS, ROUTINE W REFLEX MICROSCOPIC - Abnormal; Notable for the following:    Specific Gravity, Urine 1.032 (*)    Glucose, UA >=500 (*)    Hgb urine dipstick LARGE (*)    Protein, ur 100 (*)    Squamous Epithelial / LPF 0-5 (*)    All other components within normal limits  CBG MONITORING, ED - Abnormal; Notable for the following:    Glucose-Capillary 552 (*)    All other components within normal limits  CBG MONITORING, ED - Abnormal; Notable for the following:    Glucose-Capillary 413 (*)    All other components within normal limits    EKG  EKG Interpretation None       Radiology No results found.  Procedures Procedures (including critical care time)  Medications Ordered in ED Medications  sodium chloride 0.9 % bolus 1,000 mL (1,000 mLs Intravenous New Bag/Given 03/18/17 1526)  sodium chloride 0.9 % bolus 1,000 mL (0 mLs Intravenous Stopped 03/18/17 1526)  acetaminophen (TYLENOL) tablet 1,000 mg (1,000 mg Oral Given 03/18/17 1433)     Initial Impression / Assessment and Plan / ED Course  I have reviewed the triage vital signs and the nursing notes.  Pertinent labs & imaging results that were  available during my care of the patient were reviewed by me and considered in my medical decision making (see chart for details).     Presents with hyperglycemia, poct glucose 400-500s. Concerning for new onset type 2 DM as recent history of polyuria and polydipsia. No evidence of DKA or complications. Well appearing otherwise. Given IVF. Will start on metformin. Some diabetes teaching is provided. He can have follow-up at the health department, but states that his Medicaid is kicking in this week and he has a PCP that he is also been assigned to which she will also be able to follow-up.  The patient appears reasonably screened and/or stabilized for discharge and I doubt any other medical condition or other [facility] requiring further screening, evaluation, or treatment in the ED at this time prior to discharge.  Strict return and follow-up instructions reviewed. He expressed understanding of all discharge instructions and felt comfortable with the plan of care.   Final Clinical Impressions(s) / ED Diagnoses   Final diagnoses:  New onset type 2 diabetes mellitus (HCC)  Hyperglycemia    New Prescriptions New Prescriptions   BLOOD GLUCOSE METER KIT AND SUPPLIES KIT    Dispense based on patient and insurance preference. Use up to four times daily as directed. (FOR ICD-9 250.00, 250.01).   GLUCOSE BLOOD TEST STRIP    Use as instructed   METFORMIN (GLUCOPHAGE) 500 MG TABLET    Take 1 tablet (500 mg total) by mouth 2 (two) times daily with a meal.    I personally performed the services described in this documentation, which was scribed in my presence. The recorded information has been reviewed and is accurate.    Forde Dandy, MD 03/18/17 1630

## 2017-03-21 ENCOUNTER — Ambulatory Visit (HOSPITAL_COMMUNITY): Admission: RE | Admit: 2017-03-21 | Payer: Medicaid Other | Source: Ambulatory Visit | Admitting: Oral Surgery

## 2017-03-21 ENCOUNTER — Encounter (HOSPITAL_COMMUNITY): Admission: RE | Payer: Self-pay | Source: Ambulatory Visit

## 2017-03-21 SURGERY — MULTIPLE EXTRACTION WITH ALVEOLOPLASTY
Anesthesia: General | Laterality: Bilateral

## 2017-05-05 ENCOUNTER — Emergency Department (HOSPITAL_COMMUNITY)
Admission: EM | Admit: 2017-05-05 | Discharge: 2017-05-05 | Disposition: A | Discharge status: Home / Self Care | Payer: Medicaid Other | Attending: Emergency Medicine | Admitting: Emergency Medicine

## 2017-05-05 ENCOUNTER — Encounter (HOSPITAL_COMMUNITY): Payer: Self-pay | Admitting: *Deleted

## 2017-05-05 DIAGNOSIS — K047 Periapical abscess without sinus: Secondary | ICD-10-CM

## 2017-05-05 DIAGNOSIS — F1290 Cannabis use, unspecified, uncomplicated: Secondary | ICD-10-CM | POA: Insufficient documentation

## 2017-05-05 DIAGNOSIS — I10 Essential (primary) hypertension: Secondary | ICD-10-CM | POA: Diagnosis not present

## 2017-05-05 DIAGNOSIS — F17210 Nicotine dependence, cigarettes, uncomplicated: Secondary | ICD-10-CM | POA: Insufficient documentation

## 2017-05-05 DIAGNOSIS — Z79899 Other long term (current) drug therapy: Secondary | ICD-10-CM | POA: Diagnosis not present

## 2017-05-05 DIAGNOSIS — Z7984 Long term (current) use of oral hypoglycemic drugs: Secondary | ICD-10-CM | POA: Insufficient documentation

## 2017-05-05 DIAGNOSIS — K0889 Other specified disorders of teeth and supporting structures: Secondary | ICD-10-CM | POA: Diagnosis present

## 2017-05-05 DIAGNOSIS — Z7982 Long term (current) use of aspirin: Secondary | ICD-10-CM | POA: Diagnosis not present

## 2017-05-05 MED ORDER — CLINDAMYCIN HCL 150 MG PO CAPS: 150.0000 mg | ORAL_CAPSULE | Freq: Four times a day (QID) | ORAL | 0 refills | Status: DC

## 2017-05-05 MED ORDER — HYDROCODONE-ACETAMINOPHEN 5-325 MG PO TABS: 2.0000 | ORAL_TABLET | ORAL | 0 refills | Status: DC | PRN

## 2017-05-05 NOTE — Discharge Instructions (Signed)
See your Dentist for evaluation

## 2017-05-05 NOTE — ED Triage Notes (Signed)
Pt is here for dental pain.  Pt was seen for this recently and given antibiotics.  Pain has began increasing again.  Pt is waiting to see a dentist to have all his teeth pulled.  No fever with this.

## 2017-05-05 NOTE — ED Provider Notes (Signed)
[facility] DEPT Provider Note   CSN: [id number] Arrival date & time: 05/05/17  1111  By signing my name below, I, Collene Leyden, attest that this documentation has been prepared under the direction and in the presence of Helen Hashimoto, [place] . Electronically Signed: Collene Leyden, Scribe. 05/05/17. 12:37 PM.   History   Chief Complaint Chief Complaint  Patient presents with  . Dental Pain    HPI Comments: Taylor Obrien is a 51 y.o. male with a history of DM, who presents to the Emergency Department complaining of sudden-onset, constant left upper dental pain that began several days ago. Patient states he has a hole in his left upper tooth, which was supposed to be extracted one month ago, but he developed hypoglycemia. Patient was recently seen for the same problem, in which he was prescribed antibiotics. Patient is currently attempting to get this tooth extracted. No medications taken prior to arrival. Patient denies any fever, chills, nausea, vomiting, or facial swelling.    The history is provided by the patient. No language interpreter was used.    Past Medical History:  Diagnosis Date  . Arthritis   . Chronic pain   . Depression   . Essential hypertension, benign   . GERD (gastroesophageal reflux disease)   . HTN (hypertension) 11/27/2015  . Hyperlipidemia   . Sleep apnea    cpap   . Stroke [facility])    2016    Patient Active Problem List   Diagnosis Date Noted  . Spastic hemiplegia affecting nondominant side ([place]) 06/03/2016  . Dysuria   . OSA (obstructive sleep apnea)   . Hypokalemia   . Elevated blood pressure   . Hemiparesis affecting left side as late effect of stroke ([place])   . Epistaxis   . HTN (hypertension) 11/27/2015  . AKI (acute kidney injury) ([place]) 11/27/2015  . Hemiplegia and hemiparesis following unspecified cerebrovascular disease affecting left non-dominant side ([place]) 11/23/2015  . Gait disturbance, post-stroke 11/23/2015  . Stroke due to  occlusion of right anterior cerebral artery ([place]) 11/23/2015  . Cerebrovascular accident (CVA) due to occlusion of right anterior cerebral artery ([place])   . Essential hypertension   . Depression   . Chronic pain syndrome   . ETOH abuse   . Marijuana abuse   . Cerebrovascular accident (CVA) due to thrombosis of right carotid artery ([place])   . Malignant hypertension   . Hyperlipidemia   . CVA (cerebral infarction) 11/18/2015  . Stroke (cerebrum) ([place]) 11/18/2015  . Chest pain 10/09/2012  . GERD (gastroesophageal reflux disease) 10/09/2012  . Chronic back pain 10/09/2012  . Tobacco abuse 10/09/2012  . Hypertensive heart disease 08/31/2012  . Accelerated hypertension 08/31/2012  . Precordial pain 08/31/2012    Past Surgical History:  Procedure Laterality Date  . CLOSED REDUCTION MANDIBULAR FRACTURE W/ ARCH BARS     + multiple extractions  . Condyloma resection    . RADIOLOGY WITH ANESTHESIA N/A 11/18/2015   Procedure: RADIOLOGY WITH ANESTHESIA;  Surgeon: Luanne Bras, MD;  Location: [facility];  Service: Radiology;  Laterality: N/A;  . Removal foreign body right shoulder    . Right rotator cuff repair    . TEE WITHOUT CARDIOVERSION N/A 11/21/2015   Procedure: TRANSESOPHAGEAL ECHOCARDIOGRAM (TEE);  Surgeon: Larey Dresser, MD;  Location: [facility];  Service: Cardiovascular;  Laterality: N/A;       Home Medications    Prior to Admission medications   Medication Sig Start Date End Date Taking? Authorizing Provider  acetaminophen (  TYLENOL) 500 MG tablet Take 2 tablets (1,000 mg total) by mouth every 8 (eight) hours as needed. Patient taking differently: Take 500 mg by mouth every 8 (eight) hours as needed for mild pain.  12/20/15   Love, Ivan Anchors, PA-C  ALPRAZolam (XANAX) 0.25 MG tablet Take 1 tablet (0.25 mg total) by mouth 2 (two) times daily as needed for anxiety (uncontrolled left arm movements). Patient not taking: Reported on 03/12/2017 12/20/15   Love, Ivan Anchors, PA-C    amLODipine (NORVASC) 10 MG tablet Take 1 tablet (10 mg total) by mouth daily. 01/19/16   Kirsteins, [name], MD  aspirin 325 MG tablet Take 1 tablet (325 mg total) by mouth daily. 12/20/15   Love, Ivan Anchors, PA-C  atorvastatin (LIPITOR) 40 MG tablet Take 1 tablet (40 mg total) by mouth daily at 6 PM. 01/19/16   Kirsteins, [name], MD  blood glucose meter kit and supplies KIT Dispense based on patient and insurance preference. Use up to four times daily as directed. (FOR ICD-9 250.00, 250.01). 03/18/17   Forde Dandy, MD  chlorthalidone (HYGROTON) 50 MG tablet Take 1 tablet (50 mg total) by mouth daily. Patient taking differently: Take 50 mg by mouth every evening.  01/19/16   Kirsteins, [name], MD  citalopram (CELEXA) 10 MG tablet Take 1 tablet (10 mg total) by mouth daily. Patient not taking: Reported on 03/12/2017 01/19/16   Charlett Blake, MD  cloNIDine (CATAPRES - DOSED IN MG/24 HR) 0.3 mg/24hr patch Place 1 patch (0.3 mg total) onto the skin once a week. Next change on monday 12/20/15   Love, Ivan Anchors, PA-C  clotrimazole (LOTRIMIN) 1 % cream Apply topically 2 (two) times daily. 01/19/16   Kirsteins, [name], MD  diphenhydramine-acetaminophen (TYLENOL PM) 25-500 MG TABS tablet Take 1-2 tablets by mouth at bedtime as needed (for sleep). Depends on insomnia if takes 1-2 tablets    [provider]  escitalopram (LEXAPRO) 10 MG tablet Take 10 mg by mouth daily.    [provider]  folic acid (FOLVITE) 1 MG tablet Take 1 tablet (1 mg total) by mouth daily. 12/20/15   Bary Leriche, PA-C  glucose blood test strip Use as instructed 03/18/17   Forde Dandy, MD  hydrocerin (EUCERIN) CREA Apply 1 application topically 3 (three) times daily. Available over the counter Patient taking differently: Apply 1 application topically 3 (three) times daily as needed (dry skin). Available over the counter 12/20/15   Love, Ivan Anchors, PA-C  HYDROcodone-acetaminophen (NORCO/VICODIN) 5-325 MG tablet Take  1 tablet by mouth every 6 (six) hours as needed for moderate pain. Patient not taking: Reported on 03/12/2017 12/19/16   Carole Civil, MD  labetalol (NORMODYNE) 200 MG tablet Take 1 tablet (200 mg total) by mouth 3 (three) times daily. 01/19/16   Kirsteins, [name], MD  metFORMIN (GLUCOPHAGE) 500 MG tablet Take 1 tablet (500 mg total) by mouth 2 (two) times daily with a meal. 03/18/17   Forde Dandy, MD  methocarbamol (ROBAXIN) 500 MG tablet Take 2 tablets (1,000 mg total) by mouth 4 (four) times daily as needed for muscle spasms (muscle spasm/pain). Patient not taking: Reported on 03/12/2017 01/17/17   Francine Graven, DO  Multiple Vitamin (MULTIVITAMIN WITH MINERALS) TABS tablet Take 1 tablet by mouth daily. 12/20/15   Love, Ivan Anchors, PA-C  pantoprazole (PROTONIX) 40 MG tablet Take 1 tablet (40 mg total) by mouth at bedtime. 01/19/16   Kirsteins, [name], MD  potassium  chloride SA (K-DUR,KLOR-CON) 20 MEQ tablet Take 1 tablet (20 mEq total) by mouth 2 (two) times daily. 01/19/16   Kirsteins, [name], MD  sodium chloride (OCEAN) 0.65 % SOLN nasal spray Place 1 spray into both nostrils as needed for congestion. 12/20/15   Love, Ivan Anchors, PA-C  tiZANidine (ZANAFLEX) 4 MG tablet TAKE (2) TABLETS BY MOUTH AT BEDTIME. 06/13/16   Kirsteins, [name], MD  traZODone (DESYREL) 100 MG tablet Take 1 tablet (100 mg total) by mouth at bedtime. 01/19/16   Kirsteins, [name], MD    Family History Family History  Problem Relation Age of Onset  . Diabetes Mother   . Hypertension Mother   . Drug abuse Mother   . Hyperlipidemia Mother   . Diabetes Father   . Hypertension Father   . Hyperlipidemia Father   . Diabetes Brother   . Hypertension Brother     Social History Social History  Substance Use Topics  . Smoking status: Current Every Day Smoker    Packs/day: 0.50    Years: 30.00    Types: Cigarettes    Last attempt to quit: 11/17/2015  . Smokeless tobacco: Never Used  . Alcohol use No      Allergies   Oxcarbazepine   Review of Systems Review of Systems  Constitutional: Negative for chills and fever.  HENT: Positive for dental problem. Negative for facial swelling.   Gastrointestinal: Negative for nausea and vomiting.  All other systems reviewed and are negative.    Physical Exam Updated Vital Signs BP (!) 156/101 (BP Location: Right Arm)   Pulse 75   Temp 98.7 F (37.1 C) (Oral)   Resp 16   Wt 210 lb (95.3 kg)   SpO2 100%   BMI 34.95 kg/m   Physical Exam  Constitutional: He is oriented to person, place, and time. He appears well-developed.  HENT:  Head: Normocephalic and atraumatic.  Mouth/Throat: Oropharynx is clear and moist.  Poor dentition. Swelling of the left upper gumline.   Eyes: Conjunctivae and EOM are normal. Pupils are equal, round, and reactive to light.  Neck: Normal range of motion. Neck supple.  Cardiovascular: Normal rate.   Pulmonary/Chest: Effort normal.  Abdominal: Soft. Bowel sounds are normal.  Neurological: He is alert and oriented to person, place, and time.  Skin: Skin is warm and dry.  Psychiatric: He has a normal mood and affect.  Nursing note and vitals reviewed.    ED Treatments / Results  DIAGNOSTIC STUDIES: Oxygen Saturation is 100% on RA, normal by my interpretation.    COORDINATION OF CARE: 12:36 PM Discussed treatment plan with pt at bedside and pt agreed to plan, which includes ciprofloxacin and hydrocodone.   Labs (all labs ordered are listed, but only abnormal results are displayed) Labs Reviewed - No data to display  EKG  EKG Interpretation None       Radiology No results found.  Procedures Procedures (including critical care time)  Medications Ordered in ED Medications - No data to display   Initial Impression / Assessment and Plan / ED Course  I have reviewed the triage vital signs and the nursing notes.  Pertinent labs & imaging results that were available during my care of the  patient were reviewed by me and considered in my medical decision making (see chart for details).       Final Clinical Impressions(s) / ED Diagnoses   Final diagnoses:  Dental infection    New Prescriptions Discharge Medication List as of  05/05/2017 12:37 PM    START taking these medications   Details  clindamycin (CLEOCIN) 150 MG capsule Take 1 capsule (150 mg total) by mouth every 6 (six) hours., Starting Mon 05/05/2017, Print    HYDROcodone-acetaminophen (NORCO/VICODIN) 5-325 MG tablet Take 2 tablets by mouth every 4 (four) hours as needed., Starting Mon 05/05/2017, Print        I personally performed the services in this documentation, which was scribed in my presence.  The recorded information has been reviewed and considered.   Ronnald Collum. Current Meds  Medication Sig  . acetaminophen (TYLENOL) 500 MG tablet Take 2 tablets (1,000 mg total) by mouth every 8 (eight) hours as needed. (Patient taking differently: Take 500 mg by mouth every 8 (eight) hours as needed for mild pain. )  . amLODipine (NORVASC) 10 MG tablet Take 1 tablet (10 mg total) by mouth daily.  Marland Kitchen aspirin 325 MG tablet Take 1 tablet (325 mg total) by mouth daily.  Marland Kitchen atorvastatin (LIPITOR) 40 MG tablet Take 1 tablet (40 mg total) by mouth daily at 6 PM.  . blood glucose meter kit and supplies KIT Dispense based on patient and insurance preference. Use up to four times daily as directed. (FOR ICD-9 250.00, 250.01).  . chlorthalidone (HYGROTON) 50 MG tablet Take 1 tablet (50 mg total) by mouth daily. (Patient taking differently: Take 50 mg by mouth every evening. )  . cloNIDine (CATAPRES - DOSED IN MG/24 HR) 0.3 mg/24hr patch Place 1 patch (0.3 mg total) onto the skin once a week. Next change on monday  . diphenhydramine-acetaminophen (TYLENOL PM) 25-500 MG TABS tablet Take 1-2 tablets by mouth at bedtime as needed (for sleep). Depends on insomnia if takes 1-2 tablets  . escitalopram (LEXAPRO) 10 MG tablet Take 10  mg by mouth daily.  . folic acid (FOLVITE) 1 MG tablet Take 1 tablet (1 mg total) by mouth daily.  Marland Kitchen glucose blood test strip Use as instructed  . hydrocerin (EUCERIN) CREA Apply 1 application topically 3 (three) times daily. Available over the counter (Patient taking differently: Apply 1 application topically 3 (three) times daily as needed (dry skin). Available over the counter)  . labetalol (NORMODYNE) 200 MG tablet Take 1 tablet (200 mg total) by mouth 3 (three) times daily.  . metFORMIN (GLUCOPHAGE) 500 MG tablet Take 1 tablet (500 mg total) by mouth 2 (two) times daily with a meal.  . Multiple Vitamin (MULTIVITAMIN WITH MINERALS) TABS tablet Take 1 tablet by mouth daily.  . pantoprazole (PROTONIX) 40 MG tablet Take 1 tablet (40 mg total) by mouth at bedtime.  . potassium chloride SA (K-DUR,KLOR-CON) 20 MEQ tablet Take 1 tablet (20 mEq total) by mouth 2 (two) times daily.  Marland Kitchen tiZANidine (ZANAFLEX) 4 MG tablet TAKE (2) TABLETS BY MOUTH AT BEDTIME.  . traZODone (DESYREL) 100 MG tablet Take 1 tablet (100 mg total) by mouth at bedtime.   An After Visit Summary was printed and given to the patient.    Fransico Meadow, [place] 05/05/17 1610    Elnora Morrison, MD 05/06/17 [phone]

## 2017-05-18 ENCOUNTER — Emergency Department (HOSPITAL_COMMUNITY)
Admission: EM | Admit: 2017-05-18 | Discharge: 2017-05-18 | Disposition: A | Discharge status: Home / Self Care | Payer: Medicaid Other | Attending: Emergency Medicine | Admitting: Emergency Medicine

## 2017-05-18 ENCOUNTER — Encounter (HOSPITAL_COMMUNITY): Payer: Self-pay | Admitting: *Deleted

## 2017-05-18 ENCOUNTER — Emergency Department (HOSPITAL_COMMUNITY): Payer: Medicaid Other

## 2017-05-18 DIAGNOSIS — Y929 Unspecified place or not applicable: Secondary | ICD-10-CM | POA: Diagnosis not present

## 2017-05-18 DIAGNOSIS — S299XXA Unspecified injury of thorax, initial encounter: Secondary | ICD-10-CM | POA: Diagnosis present

## 2017-05-18 DIAGNOSIS — S20212A Contusion of left front wall of thorax, initial encounter: Secondary | ICD-10-CM | POA: Insufficient documentation

## 2017-05-18 DIAGNOSIS — Y999 Unspecified external cause status: Secondary | ICD-10-CM | POA: Insufficient documentation

## 2017-05-18 DIAGNOSIS — S40012A Contusion of left shoulder, initial encounter: Secondary | ICD-10-CM | POA: Insufficient documentation

## 2017-05-18 DIAGNOSIS — F17210 Nicotine dependence, cigarettes, uncomplicated: Secondary | ICD-10-CM | POA: Diagnosis not present

## 2017-05-18 DIAGNOSIS — W1839XA Other fall on same level, initial encounter: Secondary | ICD-10-CM | POA: Diagnosis not present

## 2017-05-18 DIAGNOSIS — Y9389 Activity, other specified: Secondary | ICD-10-CM | POA: Diagnosis not present

## 2017-05-18 DIAGNOSIS — Z7984 Long term (current) use of oral hypoglycemic drugs: Secondary | ICD-10-CM | POA: Insufficient documentation

## 2017-05-18 DIAGNOSIS — S20219A Contusion of unspecified front wall of thorax, initial encounter: Secondary | ICD-10-CM

## 2017-05-18 DIAGNOSIS — Z7982 Long term (current) use of aspirin: Secondary | ICD-10-CM | POA: Diagnosis not present

## 2017-05-18 DIAGNOSIS — I10 Essential (primary) hypertension: Secondary | ICD-10-CM | POA: Diagnosis not present

## 2017-05-18 NOTE — ED Triage Notes (Addendum)
Pt c/o back, left rib cage and left shoulder pain after falling backwards out of his wheelchair while going up a ramp about 4 days ago. Denies LOC upon fall. Pt denies SOB but reports pain with deep breathing.

## 2017-05-18 NOTE — ED Provider Notes (Signed)
[facility] DEPT Provider Note   CSN: [id number] Arrival date & time: 05/18/17  1045     History   Chief Complaint Chief Complaint  Patient presents with  . Back Pain    HPI Taylor Obrien is a 51 y.o. male.  The history is provided by the patient. No language interpreter was used.  Fall  This is a new problem. The current episode started more than 2 days ago. The problem occurs constantly. The problem has not changed since onset.Associated symptoms include chest pain. The symptoms are aggravated by exertion. Nothing relieves the symptoms. The treatment provided no relief.   Pt reports he fell out of his wheelchair 3 days ago.  Pt reports he hit his left shoulder and his chest.  Pt complains of pain with a deep breath. Past Medical History:  Diagnosis Date  . Arthritis   . Chronic pain   . Depression   . Essential hypertension, benign   . GERD (gastroesophageal reflux disease)   . HTN (hypertension) 11/27/2015  . Hyperlipidemia   . Sleep apnea    cpap   . Stroke [facility])    2016    Patient Active Problem List   Diagnosis Date Noted  . Spastic hemiplegia affecting nondominant side ([place]) 06/03/2016  . Dysuria   . OSA (obstructive sleep apnea)   . Hypokalemia   . Elevated blood pressure   . Hemiparesis affecting left side as late effect of stroke ([place])   . Epistaxis   . HTN (hypertension) 11/27/2015  . AKI (acute kidney injury) ([place]) 11/27/2015  . Hemiplegia and hemiparesis following unspecified cerebrovascular disease affecting left non-dominant side ([place]) 11/23/2015  . Gait disturbance, post-stroke 11/23/2015  . Stroke due to occlusion of right anterior cerebral artery ([place]) 11/23/2015  . Cerebrovascular accident (CVA) due to occlusion of right anterior cerebral artery ([place])   . Essential hypertension   . Depression   . Chronic pain syndrome   . ETOH abuse   . Marijuana abuse   . Cerebrovascular accident (CVA) due to thrombosis of right carotid artery ([place])     . Malignant hypertension   . Hyperlipidemia   . CVA (cerebral infarction) 11/18/2015  . Stroke (cerebrum) ([place]) 11/18/2015  . Chest pain 10/09/2012  . GERD (gastroesophageal reflux disease) 10/09/2012  . Chronic back pain 10/09/2012  . Tobacco abuse 10/09/2012  . Hypertensive heart disease 08/31/2012  . Accelerated hypertension 08/31/2012  . Precordial pain 08/31/2012    Past Surgical History:  Procedure Laterality Date  . CLOSED REDUCTION MANDIBULAR FRACTURE W/ ARCH BARS     + multiple extractions  . Condyloma resection    . RADIOLOGY WITH ANESTHESIA N/A 11/18/2015   Procedure: RADIOLOGY WITH ANESTHESIA;  Surgeon: Luanne Bras, MD;  Location: [facility];  Service: Radiology;  Laterality: N/A;  . Removal foreign body right shoulder    . Right rotator cuff repair    . TEE WITHOUT CARDIOVERSION N/A 11/21/2015   Procedure: TRANSESOPHAGEAL ECHOCARDIOGRAM (TEE);  Surgeon: Larey Dresser, MD;  Location: [facility];  Service: Cardiovascular;  Laterality: N/A;       Home Medications    Prior to Admission medications   Medication Sig Start Date End Date Taking? Authorizing Provider  acetaminophen (TYLENOL) 500 MG tablet Take 2 tablets (1,000 mg total) by mouth every 8 (eight) hours as needed. Patient taking differently: Take 500 mg by mouth every 8 (eight) hours as needed for mild pain.  12/20/15   Love, Ivan Anchors, PA-C  amLODipine (NORVASC) 10  MG tablet Take 1 tablet (10 mg total) by mouth daily. 01/19/16   Kirsteins, [name], MD  aspirin 325 MG tablet Take 1 tablet (325 mg total) by mouth daily. 12/20/15   Love, Ivan Anchors, PA-C  atorvastatin (LIPITOR) 40 MG tablet Take 1 tablet (40 mg total) by mouth daily at 6 PM. 01/19/16   Kirsteins, [name], MD  blood glucose meter kit and supplies KIT Dispense based on patient and insurance preference. Use up to four times daily as directed. (FOR ICD-9 250.00, 250.01). 03/18/17   Forde Dandy, MD  chlorthalidone (HYGROTON) 50 MG tablet Take 1  tablet (50 mg total) by mouth daily. Patient taking differently: Take 50 mg by mouth every evening.  01/19/16   Kirsteins, [name], MD  clindamycin (CLEOCIN) 150 MG capsule Take 1 capsule (150 mg total) by mouth every 6 (six) hours. 05/05/17   Fransico Meadow, PA-C  cloNIDine (CATAPRES - DOSED IN MG/24 HR) 0.3 mg/24hr patch Place 1 patch (0.3 mg total) onto the skin once a week. Next change on monday 12/20/15   Love, Ivan Anchors, PA-C  diphenhydramine-acetaminophen (TYLENOL PM) 25-500 MG TABS tablet Take 1-2 tablets by mouth at bedtime as needed (for sleep). Depends on insomnia if takes 1-2 tablets    [provider]  escitalopram (LEXAPRO) 10 MG tablet Take 10 mg by mouth daily.    [provider]  folic acid (FOLVITE) 1 MG tablet Take 1 tablet (1 mg total) by mouth daily. 12/20/15   Bary Leriche, PA-C  glucose blood test strip Use as instructed 03/18/17   Forde Dandy, MD  hydrocerin (EUCERIN) CREA Apply 1 application topically 3 (three) times daily. Available over the counter Patient taking differently: Apply 1 application topically 3 (three) times daily as needed (dry skin). Available over the counter 12/20/15   Love, Ivan Anchors, PA-C  HYDROcodone-acetaminophen (NORCO/VICODIN) 5-325 MG tablet Take 2 tablets by mouth every 4 (four) hours as needed. 05/05/17   Fransico Meadow, PA-C  labetalol (NORMODYNE) 200 MG tablet Take 1 tablet (200 mg total) by mouth 3 (three) times daily. 01/19/16   Kirsteins, [name], MD  metFORMIN (GLUCOPHAGE) 500 MG tablet Take 1 tablet (500 mg total) by mouth 2 (two) times daily with a meal. 03/18/17   Forde Dandy, MD  methocarbamol (ROBAXIN) 500 MG tablet Take 2 tablets (1,000 mg total) by mouth 4 (four) times daily as needed for muscle spasms (muscle spasm/pain). Patient not taking: Reported on 03/12/2017 01/17/17   Francine Graven, DO  Multiple Vitamin (MULTIVITAMIN WITH MINERALS) TABS tablet Take 1 tablet by mouth daily. 12/20/15   Love, Ivan Anchors, PA-C    pantoprazole (PROTONIX) 40 MG tablet Take 1 tablet (40 mg total) by mouth at bedtime. 01/19/16   Kirsteins, [name], MD  potassium chloride SA (K-DUR,KLOR-CON) 20 MEQ tablet Take 1 tablet (20 mEq total) by mouth 2 (two) times daily. 01/19/16   Kirsteins, [name], MD  tiZANidine (ZANAFLEX) 4 MG tablet TAKE (2) TABLETS BY MOUTH AT BEDTIME. 06/13/16   Kirsteins, [name], MD  traZODone (DESYREL) 100 MG tablet Take 1 tablet (100 mg total) by mouth at bedtime. 01/19/16   Kirsteins, [name], MD    Family History Family History  Problem Relation Age of Onset  . Diabetes Mother   . Hypertension Mother   . Drug abuse Mother   . Hyperlipidemia Mother   . Diabetes Father   . Hypertension Father   . Hyperlipidemia Father   .  Diabetes Brother   . Hypertension Brother     Social History Social History  Substance Use Topics  . Smoking status: Current Every Day Smoker    Packs/day: 0.50    Years: 30.00    Types: Cigarettes    Last attempt to quit: 11/17/2015  . Smokeless tobacco: Never Used  . Alcohol use No     Allergies   Oxcarbazepine   Review of Systems Review of Systems  Cardiovascular: Positive for chest pain.  All other systems reviewed and are negative.    Physical Exam Updated Vital Signs BP (!) 146/91   Pulse (!) 55   Temp 98.4 F (36.9 C) (Oral)   Resp 16   Ht _0  (1.651 m)   Wt 95.3 kg (210 lb)   SpO2 97%   BMI 34.95 kg/m   Physical Exam  Constitutional: He appears well-developed and well-nourished.  HENT:  Head: Normocephalic and atraumatic.  Right Ear: External ear normal.  Left Ear: External ear normal.  Nose: Nose normal.  Eyes: Conjunctivae are normal.  Neck: Neck supple.  Cardiovascular: Normal rate and regular rhythm.   No murmur heard. Pulmonary/Chest: Effort normal and breath sounds normal. No respiratory distress. He exhibits tenderness.  Abdominal: Soft. There is no tenderness.  Musculoskeletal: He exhibits no edema.  Tender left shoulder  to palpation,  Pain to palpation left ribs,    Neurological: He is alert.  Skin: Skin is warm and dry.  Psychiatric: He has a normal mood and affect.  Nursing note and vitals reviewed.    ED Treatments / Results  Labs (all labs ordered are listed, but only abnormal results are displayed) Labs Reviewed - No data to display  EKG  EKG Interpretation None       Radiology Dg Chest 2 View  Result Date: 05/18/2017 CLINICAL DATA:  Left rib, shoulder and back pain since a fall out of a wheelchair 4 days ago. EXAM: CHEST  2 VIEW COMPARISON:  PA and lateral chest 11/27/2012. FINDINGS: The lungs are clear. Heart size is upper normal. No pneumothorax or pleural effusion. No acute bony abnormality. IMPRESSION: No acute disease. Electronically Signed   By: Inge Rise M.D.   On: 05/18/2017 12:59   Dg Shoulder Left  Result Date: 05/18/2017 CLINICAL DATA:  Left shoulder pain since a fall backwards other wheelchair on a ramp 4 days ago. Initial encounter. EXAM: LEFT SHOULDER - 2+ VIEW COMPARISON:  None. FINDINGS: There is no acute bony or joint abnormality. Acromioclavicular osteoarthritis is noted. IMPRESSION: No acute abnormality. Acromioclavicular osteoarthritis. Electronically Signed   By: Inge Rise M.D.   On: 05/18/2017 13:00    Procedures Procedures (including critical care time)  Medications Ordered in ED Medications - No data to display   Initial Impression / Assessment and Plan / ED Course  I have reviewed the triage vital signs and the nursing notes.  Pertinent labs & imaging results that were available during my care of the patient were reviewed by me and considered in my medical decision making (see chart for details).     [place] database reviewed  Rx for oxycodone 5/29 for 0xycodone 90 tablets by Dr. Cindie Laroche  Final Clinical Impressions(s) / ED Diagnoses   Final diagnoses:  Contusion of chest wall, unspecified laterality, initial encounter  Contusion of left  shoulder, initial encounter    New Prescriptions New Prescriptions   No medications on file  Pt is in pain management.   I advised pt to follow up with  his MD for recheck. Ice Continue current medications.   Pt has not taken bp medications.  He will take at home.   Fransico Meadow, [place] 05/18/17 1406    Mesner, Corene Cornea, MD 05/18/17 [phone]

## 2017-05-18 NOTE — ED Notes (Signed)
Patient transported to X-ray 

## 2017-05-18 NOTE — Discharge Instructions (Signed)
See your Physician for recheck in 2-3 days  

## 2017-05-21 ENCOUNTER — Ambulatory Visit: Payer: Medicaid Other | Admitting: Orthopaedic Surgery

## 2017-09-21 ENCOUNTER — Encounter (HOSPITAL_COMMUNITY): Payer: Self-pay | Admitting: Emergency Medicine

## 2017-09-21 ENCOUNTER — Emergency Department (HOSPITAL_COMMUNITY): Payer: Medicaid Other

## 2017-09-21 ENCOUNTER — Emergency Department (HOSPITAL_COMMUNITY)
Admission: EM | Admit: 2017-09-21 | Discharge: 2017-09-21 | Disposition: A | Discharge status: Home / Self Care | Payer: Medicaid Other | Attending: Emergency Medicine | Admitting: Emergency Medicine

## 2017-09-21 DIAGNOSIS — J029 Acute pharyngitis, unspecified: Secondary | ICD-10-CM | POA: Diagnosis not present

## 2017-09-21 DIAGNOSIS — Z7982 Long term (current) use of aspirin: Secondary | ICD-10-CM | POA: Diagnosis not present

## 2017-09-21 DIAGNOSIS — I10 Essential (primary) hypertension: Secondary | ICD-10-CM | POA: Insufficient documentation

## 2017-09-21 DIAGNOSIS — Z8673 Personal history of transient ischemic attack (TIA), and cerebral infarction without residual deficits: Secondary | ICD-10-CM | POA: Diagnosis not present

## 2017-09-21 DIAGNOSIS — R6889 Other general symptoms and signs: Secondary | ICD-10-CM | POA: Diagnosis not present

## 2017-09-21 DIAGNOSIS — Z7984 Long term (current) use of oral hypoglycemic drugs: Secondary | ICD-10-CM | POA: Diagnosis not present

## 2017-09-21 DIAGNOSIS — F17210 Nicotine dependence, cigarettes, uncomplicated: Secondary | ICD-10-CM | POA: Insufficient documentation

## 2017-09-21 DIAGNOSIS — E119 Type 2 diabetes mellitus without complications: Secondary | ICD-10-CM | POA: Insufficient documentation

## 2017-09-21 DIAGNOSIS — Z79899 Other long term (current) drug therapy: Secondary | ICD-10-CM | POA: Insufficient documentation

## 2017-09-21 DIAGNOSIS — M791 Myalgia, unspecified site: Secondary | ICD-10-CM | POA: Diagnosis present

## 2017-09-21 DIAGNOSIS — R062 Wheezing: Secondary | ICD-10-CM | POA: Diagnosis not present

## 2017-09-21 HISTORY — DX: Type 2 diabetes mellitus without complications: E11.9

## 2017-09-21 LAB — COMPREHENSIVE METABOLIC PANEL
ALT: 43 U/L (ref 17–63)
AST: 40 U/L (ref 15–41)
Albumin: 3.8 g/dL (ref 3.5–5.0)
Alkaline Phosphatase: 55 U/L (ref 38–126)
Anion gap: 8 (ref 5–15)
BILIRUBIN TOTAL: 0.4 mg/dL (ref 0.3–1.2)
BUN: 11 mg/dL (ref 6–20)
CO2: 26 mmol/L (ref 22–32)
CREATININE: 1.26 mg/dL — AB (ref 0.61–1.24)
Calcium: 8.9 mg/dL (ref 8.9–10.3)
Chloride: 102 mmol/L (ref 101–111)
Glucose, Bld: 96 mg/dL (ref 65–99)
POTASSIUM: 4.2 mmol/L (ref 3.5–5.1)
Sodium: 136 mmol/L (ref 135–145)
TOTAL PROTEIN: 7 g/dL (ref 6.5–8.1)

## 2017-09-21 LAB — CBC WITH DIFFERENTIAL/PLATELET
BASOS ABS: 0 10*3/uL (ref 0.0–0.1)
Basophils Relative: 0 %
EOS ABS: 0 10*3/uL (ref 0.0–0.7)
EOS PCT: 1 %
HCT: 47.8 % (ref 39.0–52.0)
HEMOGLOBIN: 16.2 g/dL (ref 13.0–17.0)
Lymphocytes Relative: 26 %
Lymphs Abs: 1.2 10*3/uL (ref 0.7–4.0)
MCH: 31.7 pg (ref 26.0–34.0)
MCHC: 33.9 g/dL (ref 30.0–36.0)
MCV: 93.5 fL (ref 78.0–100.0)
Monocytes Absolute: 0.7 10*3/uL (ref 0.1–1.0)
Monocytes Relative: 14 %
NEUTROS PCT: 59 %
Neutro Abs: 2.8 10*3/uL (ref 1.7–7.7)
PLATELETS: 153 10*3/uL (ref 150–400)
RBC: 5.11 MIL/uL (ref 4.22–5.81)
RDW: 14.6 % (ref 11.5–15.5)
WBC: 4.8 10*3/uL (ref 4.0–10.5)

## 2017-09-21 LAB — URINALYSIS, ROUTINE W REFLEX MICROSCOPIC
Bacteria, UA: NONE SEEN
Bilirubin Urine: NEGATIVE
Glucose, UA: >=500 mg/dL — AB
HGB URINE DIPSTICK: NEGATIVE
KETONES UR: NEGATIVE mg/dL
LEUKOCYTES UA: NEGATIVE
Nitrite: NEGATIVE
PH: 5 (ref 5.0–8.0)
Specific Gravity, Urine: 1.024 (ref 1.005–1.030)

## 2017-09-21 LAB — RAPID STREP SCREEN (MED CTR MEBANE ONLY): Streptococcus, Group A Screen (Direct): NEGATIVE

## 2017-09-21 MED ORDER — IPRATROPIUM-ALBUTEROL 0.5-2.5 (3) MG/3ML IN SOLN
3.0000 mL | Freq: Once | RESPIRATORY_TRACT | Status: AC
  Administered 2017-09-21: 3 mL via RESPIRATORY_TRACT
  Filled 2017-09-21: qty 3

## 2017-09-21 MED ORDER — ACETAMINOPHEN 325 MG PO TABS
650.0000 mg | ORAL_TABLET | Freq: Once | ORAL | Status: AC
  Administered 2017-09-21: 650 mg via ORAL
  Filled 2017-09-21: qty 2

## 2017-09-21 MED ORDER — MAGIC MOUTHWASH W/LIDOCAINE: 5.0000 mL | Freq: Three times a day (TID) | ORAL | 0 refills | Status: DC | PRN

## 2017-09-21 MED ORDER — ALBUTEROL SULFATE HFA 108 (90 BASE) MCG/ACT IN AERS
2.0000 | INHALATION_SPRAY | Freq: Once | RESPIRATORY_TRACT | Status: AC
  Administered 2017-09-21: 2 via RESPIRATORY_TRACT
  Filled 2017-09-21: qty 6.7

## 2017-09-21 NOTE — ED Notes (Signed)
ED Provider at bedside. 

## 2017-09-21 NOTE — ED Provider Notes (Signed)
[facility] DEPT Provider Note   CSN: [id number] Arrival date & time: 09/21/17  0848     History   Chief Complaint Chief Complaint  Patient presents with  . Generalized Body Aches    HPI Taylor Obrien is a 51 y.o. male.  HPI   Taylor Obrien is a 51 y.o. male who presents to the Emergency Department complaining of generalized body aches, sore throat and productive cough.  Symptoms have been present for 4 days.  Cough is intermittent and associated with wheezing and productive of yellow sputum.  He reports having chills and sweats, but has not checked his temperature.  He has not tried any OTC medications for symptomatic relief.  Denies chest pain, shortness of breath, abdominal pain, vomiting or diarrhea.     Past Medical History:  Diagnosis Date  . Arthritis   . Chronic pain   . Depression   . Diabetes mellitus without complication (Taylor Obrien)   . Essential hypertension, benign   . GERD (gastroesophageal reflux disease)   . HTN (hypertension) 11/27/2015  . Hyperlipidemia   . Sleep apnea    cpap   . Stroke [facility])    2016    Patient Active Problem List   Diagnosis Date Noted  . Spastic hemiplegia affecting nondominant side (Taylor Obrien) 06/03/2016  . Dysuria   . OSA (obstructive sleep apnea)   . Hypokalemia   . Elevated blood pressure   . Hemiparesis affecting left side as late effect of stroke (Taylor Obrien)   . Epistaxis   . HTN (hypertension) 11/27/2015  . AKI (acute kidney injury) (Taylor Obrien) 11/27/2015  . Hemiplegia and hemiparesis following unspecified cerebrovascular disease affecting left non-dominant side ([place]) 11/23/2015  . Gait disturbance, post-stroke 11/23/2015  . Stroke due to occlusion of right anterior cerebral artery ([place]) 11/23/2015  . Cerebrovascular accident (CVA) due to occlusion of right anterior cerebral artery ([place])   . Essential hypertension   . Depression   . Chronic pain syndrome   . ETOH abuse   . Marijuana abuse   . 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 Malignant hypertension   . Hyperlipidemia   . CVA (cerebral infarction) 11/18/2015  . Stroke (cerebrum) ([place]) 11/18/2015  . Chest pain 10/09/2012  . GERD (gastroesophageal reflux disease) 10/09/2012  . Chronic back pain 10/09/2012  . Tobacco abuse 10/09/2012  . Hypertensive heart disease 08/31/2012  . Accelerated hypertension 08/31/2012  . Precordial pain 08/31/2012    Past Surgical History:  Procedure Laterality Date  . CLOSED REDUCTION MANDIBULAR FRACTURE W/ ARCH BARS     + multiple extractions  . Condyloma resection    . RADIOLOGY WITH ANESTHESIA N/A 11/18/2015   Procedure: RADIOLOGY WITH ANESTHESIA;  Surgeon: Luanne Bras, MD;  Location: [place];  Service: Radiology;  Laterality: N/A;  . Removal foreign body right shoulder    . Right rotator cuff repair    . TEE WITHOUT CARDIOVERSION N/A 11/21/2015   Procedure: TRANSESOPHAGEAL ECHOCARDIOGRAM (TEE);  Surgeon: Larey Dresser, MD;  Location: [place];  Service: Cardiovascular;  Laterality: N/A;       Home Medications    Prior to Admission medications   Medication Sig Start Date End Date Taking? Authorizing Provider  acetaminophen (TYLENOL) 500 MG tablet Take 2 tablets (1,000 mg total) by mouth every 8 (eight) hours as needed. Patient taking differently: Take 500 mg by mouth every 8 (eight) hours as needed for mild pain.  12/20/15   Bary Leriche, PA-C  amLODipine (NORVASC) 10 MG tablet Take 1 tablet (10 mg total) by mouth daily. 01/19/16   Kirsteins, [name], MD  aspirin 325 MG tablet Take 1 tablet (325 mg total) by mouth daily. 12/20/15   Love, Ivan Anchors, PA-C  atorvastatin (LIPITOR) 40 MG tablet Take 1 tablet (40 mg total) by mouth daily at 6 PM. 01/19/16   Kirsteins, [name], MD  blood glucose meter kit and supplies KIT Dispense based on patient and insurance preference. Use up to four times daily as directed. (FOR ICD-9 250.00, 250.01). 03/18/17   Forde Dandy, MD    chlorthalidone (HYGROTON) 50 MG tablet Take 1 tablet (50 mg total) by mouth daily. Patient taking differently: Take 50 mg by mouth every evening.  01/19/16   Kirsteins, [name], MD  clindamycin (CLEOCIN) 150 MG capsule Take 1 capsule (150 mg total) by mouth every 6 (six) hours. 05/05/17   Fransico Meadow, PA-C  cloNIDine (CATAPRES - DOSED IN MG/24 HR) 0.3 mg/24hr patch Place 1 patch (0.3 mg total) onto the skin once a week. Next change on monday 12/20/15   Love, Ivan Anchors, PA-C  diphenhydramine-acetaminophen (TYLENOL PM) 25-500 MG TABS tablet Take 1-2 tablets by mouth at bedtime as needed (for sleep). Depends on insomnia if takes 1-2 tablets    [provider]  escitalopram (LEXAPRO) 10 MG tablet Take 10 mg by mouth daily.    [provider]  folic acid (FOLVITE) 1 MG tablet Take 1 tablet (1 mg total) by mouth daily. 12/20/15   Bary Leriche, PA-C  glucose blood test strip Use as instructed 03/18/17   Forde Dandy, MD  hydrocerin (EUCERIN) CREA Apply 1 application topically 3 (three) times daily. Available over the counter Patient taking differently: Apply 1 application topically 3 (three) times daily as needed (dry skin). Available over the counter 12/20/15   Love, Ivan Anchors, PA-C  HYDROcodone-acetaminophen (NORCO/VICODIN) 5-325 MG tablet Take 2 tablets by mouth every 4 (four) hours as needed. 05/05/17   Fransico Meadow, PA-C  labetalol (NORMODYNE) 200 MG tablet Take 1 tablet (200 mg total) by mouth 3 (three) times daily. 01/19/16   Kirsteins, [name], MD  metFORMIN (GLUCOPHAGE) 500 MG tablet Take 1 tablet (500 mg total) by mouth 2 (two) times daily with a meal. 03/18/17   Forde Dandy, MD  methocarbamol (ROBAXIN) 500 MG tablet Take 2 tablets (1,000 mg total) by mouth 4 (four) times daily as needed for muscle spasms (muscle spasm/pain). Patient not taking: Reported on 03/12/2017 01/17/17   Francine Graven, DO  Multiple Vitamin (MULTIVITAMIN WITH MINERALS) TABS tablet Take 1 tablet by  mouth daily. 12/20/15   Love, Ivan Anchors, PA-C  pantoprazole (PROTONIX) 40 MG tablet Take 1 tablet (40 mg total) by mouth at bedtime. 01/19/16   Kirsteins, [name], MD  potassium chloride SA (K-DUR,KLOR-CON) 20 MEQ tablet Take 1 tablet (20 mEq total) by mouth 2 (two) times daily. 01/19/16   Kirsteins, [name], MD  tiZANidine (ZANAFLEX) 4 MG tablet TAKE (2) TABLETS BY MOUTH AT BEDTIME. 06/13/16   Kirsteins, [name], MD  traZODone (DESYREL) 100 MG tablet Take 1 tablet (100 mg total) by mouth at bedtime. 01/19/16   Kirsteins, [name], MD    Family History Family History  Problem Relation Age of Onset  . Diabetes Mother   . Hypertension Mother   . Drug abuse Mother   . Hyperlipidemia Mother   . Diabetes Father   . Hypertension Father   .  Hyperlipidemia Father   . Diabetes Brother   . Hypertension Brother     Social History Social History  Substance Use Topics  . Smoking status: Current Every Day Smoker    Packs/day: 0.50    Years: 30.00    Types: Cigarettes    Last attempt to quit: 11/17/2015  . Smokeless tobacco: Never Used  . Alcohol use No     Allergies   Oxcarbazepine   Review of Systems Review of Systems  Constitutional: Positive for chills. Negative for activity change, appetite change and fever.  HENT: Positive for sore throat. Negative for congestion, facial swelling, rhinorrhea and trouble swallowing.   Eyes: Negative for visual disturbance.  Respiratory: Positive for cough and wheezing. Negative for chest tightness, shortness of breath and stridor.   Gastrointestinal: Negative for abdominal pain, diarrhea, nausea and vomiting.  Genitourinary: Negative for decreased urine volume, difficulty urinating and dysuria.  Musculoskeletal: Positive for myalgias. Negative for neck pain and neck stiffness.  Skin: Negative.  Negative for rash.  Neurological: Negative for dizziness, syncope, speech difficulty, weakness, numbness and headaches.  Hematological: Negative for  adenopathy.  Psychiatric/Behavioral: Negative for confusion.  All other systems reviewed and are negative.    Physical Exam Updated Vital Signs BP (!) 148/99 (BP Location: Right Arm)   Pulse 90   Temp 99.7 F (37.6 C) (Oral)   Resp 16   Ht _0  (1.676 m)   Wt 99.8 kg (220 lb)   SpO2 95%   BMI 35.51 kg/m   Physical Exam  Constitutional: He is oriented to person, place, and time. He appears well-developed and well-nourished. No distress.  HENT:  Head: Atraumatic.  Mouth/Throat: Oropharynx is clear and moist.  Eyes: Pupils are equal, round, and reactive to light. EOM are normal.  Neck: Normal range of motion. Neck supple. No JVD present.  Cardiovascular: Normal rate, regular rhythm and intact distal pulses.   Pulmonary/Chest: Effort normal. No respiratory distress. He has wheezes.  Coarse lung sounds bilaterally with few scattered expiratory wheezes.  No rales  Abdominal: Soft. He exhibits no distension and no mass. There is no tenderness. There is no guarding.  Musculoskeletal: He exhibits no edema or tenderness.  Lymphadenopathy:    He has no cervical adenopathy.  Neurological: He is alert and oriented to person, place, and time. No sensory deficit. GCS eye subscore is 4. GCS verbal subscore is 5. GCS motor subscore is 6.  CN III-XII intact.    Skin: Skin is warm. Capillary refill takes less than 2 seconds. No rash noted.  Nursing note and vitals reviewed.    ED Treatments / Results  Labs (all labs ordered are listed, but only abnormal results are displayed) Labs Reviewed  URINALYSIS, ROUTINE W REFLEX MICROSCOPIC - Abnormal; Notable for the following:       Result Value   Glucose, UA >=500 (*)    Protein, ur >=300 (*)    Squamous Epithelial / LPF 0-5 (*)    All other components within normal limits  COMPREHENSIVE METABOLIC PANEL - Abnormal; Notable for the following:    Creatinine, Ser 1.26 (*)    All other components within normal limits  RAPID STREP SCREEN (NOT  AT [facility])  CULTURE, GROUP A STREP ([place])  CBC WITH DIFFERENTIAL/PLATELET    EKG  EKG Interpretation None       Radiology Dg Chest 2 View  Result Date: 09/21/2017 CLINICAL DATA:  Productive cough. EXAM: CHEST  2 VIEW COMPARISON:  Radiographs of May 18, 2017.  FINDINGS: The heart size and mediastinal contours are within normal limits. Both lungs are clear. No pneumothorax or pleural effusion is noted. The visualized skeletal structures are unremarkable. IMPRESSION: No active cardiopulmonary disease. Electronically Signed   By: Marijo [name], M.D.   On: 09/21/2017 09:46     Procedures Procedures (including critical care time)  Medications Ordered in ED Medications  ipratropium-albuterol (DUONEB) 0.5-2.5 (3) MG/3ML nebulizer solution 3 mL (3 mLs Nebulization Given 09/21/17 0946)  albuterol (PROVENTIL HFA;VENTOLIN HFA) 108 (90 Base) MCG/ACT inhaler 2 puff (2 puffs Inhalation Given 09/21/17 0946)  acetaminophen (TYLENOL) tablet 650 mg (650 mg Oral Given 09/21/17 0926)     Initial Impression / Assessment and Plan / ED Course  I have reviewed the triage vital signs and the nursing notes.  Pertinent labs & imaging results that were available during my care of the patient were reviewed by me and considered in my medical decision making (see chart for details).     Pt non-toxic appearing.  Sx's likely viral.  CXR w/o PNA. Wheezing cleared on recheck after the albuterol neb.  Pt states he is feeling better and requesting to be discharged.  Appears stable, agrees to tx plan and return precautions discussed.    Final Clinical Impressions(s) / ED Diagnoses   Final diagnoses:  Flu-like symptoms    New Prescriptions New Prescriptions   No medications on file     Kem Parkinson, Hershal Coria 09/23/17 1344    Noemi Chapel, MD 10/05/17 1627

## 2017-09-21 NOTE — ED Triage Notes (Signed)
Pt reports generalized body aches x 4 days with a productive cough.

## 2017-09-21 NOTE — Discharge Instructions (Signed)
Tylenol every 4-6 hrs for fever and body aches.  You can also take over the counter [place]uc[place] as directed for cough.  Follow-up with your doctor for recheck

## 2017-09-23 LAB — CULTURE, GROUP A STREP (THRC)

## 2018-01-17 ENCOUNTER — Encounter (HOSPITAL_COMMUNITY): Payer: Self-pay

## 2018-01-17 ENCOUNTER — Other Ambulatory Visit: Payer: Self-pay

## 2018-01-17 ENCOUNTER — Emergency Department (HOSPITAL_COMMUNITY): Payer: Medicaid Other

## 2018-01-17 ENCOUNTER — Emergency Department (HOSPITAL_COMMUNITY)
Admission: EM | Admit: 2018-01-17 | Discharge: 2018-01-17 | Disposition: A | Discharge status: Home / Self Care | Payer: Medicaid Other | Attending: Emergency Medicine | Admitting: Emergency Medicine

## 2018-01-17 DIAGNOSIS — R600 Localized edema: Secondary | ICD-10-CM | POA: Diagnosis not present

## 2018-01-17 DIAGNOSIS — Z7984 Long term (current) use of oral hypoglycemic drugs: Secondary | ICD-10-CM | POA: Insufficient documentation

## 2018-01-17 DIAGNOSIS — F17210 Nicotine dependence, cigarettes, uncomplicated: Secondary | ICD-10-CM | POA: Insufficient documentation

## 2018-01-17 DIAGNOSIS — R2242 Localized swelling, mass and lump, left lower limb: Secondary | ICD-10-CM | POA: Diagnosis present

## 2018-01-17 DIAGNOSIS — E119 Type 2 diabetes mellitus without complications: Secondary | ICD-10-CM | POA: Diagnosis not present

## 2018-01-17 DIAGNOSIS — I10 Essential (primary) hypertension: Secondary | ICD-10-CM | POA: Insufficient documentation

## 2018-01-17 DIAGNOSIS — Z79899 Other long term (current) drug therapy: Secondary | ICD-10-CM | POA: Insufficient documentation

## 2018-01-17 DIAGNOSIS — Z7982 Long term (current) use of aspirin: Secondary | ICD-10-CM | POA: Diagnosis not present

## 2018-01-17 LAB — COMPREHENSIVE METABOLIC PANEL
ALT: 37 U/L (ref 17–63)
ANION GAP: 13 (ref 5–15)
AST: 31 U/L (ref 15–41)
Albumin: 4.5 g/dL (ref 3.5–5.0)
Alkaline Phosphatase: 67 U/L (ref 38–126)
BUN: 15 mg/dL (ref 6–20)
CHLORIDE: 101 mmol/L (ref 101–111)
CO2: 24 mmol/L (ref 22–32)
Calcium: 9.6 mg/dL (ref 8.9–10.3)
Creatinine, Ser: 1.22 mg/dL (ref 0.61–1.24)
GFR calc Af Amer: >60 mL/min (ref >60)
GFR calc non Af Amer: >60 mL/min (ref >60)
Glucose, Bld: 116 mg/dL — ABNORMAL HIGH (ref 65–99)
Potassium: 4 mmol/L (ref 3.5–5.1)
SODIUM: 138 mmol/L (ref 135–145)
TOTAL PROTEIN: 8.3 g/dL — AB (ref 6.5–8.1)
Total Bilirubin: 0.6 mg/dL (ref 0.3–1.2)

## 2018-01-17 LAB — CBC WITH DIFFERENTIAL/PLATELET
BASOS ABS: 0 10*3/uL (ref 0.0–0.1)
BASOS PCT: 0 %
EOS ABS: 0.1 10*3/uL (ref 0.0–0.7)
Eosinophils Relative: 1 %
HEMATOCRIT: 50 % (ref 39.0–52.0)
Hemoglobin: 16.8 g/dL (ref 13.0–17.0)
Lymphocytes Relative: 23 %
Lymphs Abs: 1.7 10*3/uL (ref 0.7–4.0)
MCH: 31.8 pg (ref 26.0–34.0)
MCHC: 33.6 g/dL (ref 30.0–36.0)
MCV: 94.7 fL (ref 78.0–100.0)
MONO ABS: 0.6 10*3/uL (ref 0.1–1.0)
MONOS PCT: 9 %
Neutro Abs: 4.9 10*3/uL (ref 1.7–7.7)
Neutrophils Relative %: 67 %
PLATELETS: 209 10*3/uL (ref 150–400)
RBC: 5.28 MIL/uL (ref 4.22–5.81)
RDW: 14.6 % (ref 11.5–15.5)
WBC: 7.2 10*3/uL (ref 4.0–10.5)

## 2018-01-17 LAB — CBG MONITORING, ED: Glucose-Capillary: 131 mg/dL — ABNORMAL HIGH (ref 65–99)

## 2018-01-17 MED ORDER — GABAPENTIN 100 MG PO CAPS: 100.0000 mg | ORAL_CAPSULE | Freq: Three times a day (TID) | ORAL | 0 refills | Status: DC

## 2018-01-17 MED ORDER — KETOROLAC TROMETHAMINE 30 MG/ML IJ SOLN
30.0000 mg | Freq: Once | INTRAMUSCULAR | Status: AC
  Administered 2018-01-17: 30 mg via INTRAMUSCULAR
  Filled 2018-01-17: qty 1

## 2018-01-17 MED ORDER — GABAPENTIN 100 MG PO CAPS
100.0000 mg | ORAL_CAPSULE | Freq: Once | ORAL | Status: AC
  Administered 2018-01-17: 100 mg via ORAL
  Filled 2018-01-17: qty 1

## 2018-01-17 NOTE — ED Provider Notes (Signed)
[facility] EMERGENCY DEPARTMENT Provider Note   CSN: [id number] Arrival date & time: 01/17/18  1038     History   Chief Complaint Chief Complaint  Patient presents with  . Leg Swelling    HPI Taylor Obrien is a 52 y.o. male.  HPI  Patient with history of multiple medical issues as well as prior stroke, with chronic left-sided hemiplegia presents with left-sided leg swelling, as well as pain. Patient thinks the symptoms began about 4 days ago, and since onset is been persistent, with swelling, discomfort throughout the left leg. He notes that his left great toenail fell off at some point during this illness, but he is unsure of when. He denies other new complaints including dyspnea, chest pain, fever, chills.   Past Medical History:  Diagnosis Date  . Arthritis   . Chronic pain   . Depression   . Diabetes mellitus without complication ([place])   . Essential hypertension, benign   . GERD (gastroesophageal reflux disease)   . HTN (hypertension) 11/27/2015  . Hyperlipidemia   . Sleep apnea    cpap   . Stroke [facility])    2016    Patient Active Problem List   Diagnosis Date Noted  . Spastic hemiplegia affecting nondominant side ([place]) 06/03/2016  . Dysuria   . OSA (obstructive sleep apnea)   . Hypokalemia   . Elevated blood pressure   . Hemiparesis affecting left side as late effect of stroke ([place])   . Epistaxis   . HTN (hypertension) 11/27/2015  . AKI (acute kidney injury) ([place]) 11/27/2015  . Hemiplegia and hemiparesis following unspecified cerebrovascular disease affecting left non-dominant side ([place]) 11/23/2015  . Gait disturbance, post-stroke 11/23/2015  . Stroke due to occlusion of right anterior cerebral artery ([place]) 11/23/2015  . Cerebrovascular accident (CVA) due to occlusion of right anterior cerebral artery ([place])   . Essential hypertension   . Depression   . Chronic pain syndrome   . ETOH abuse   . Marijuana abuse   . 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 Malignant hypertension   . Hyperlipidemia   . CVA (cerebral infarction) 11/18/2015  . Stroke (cerebrum) ([place]) 11/18/2015  . Chest pain 10/09/2012  . GERD (gastroesophageal reflux disease) 10/09/2012  . Chronic back pain 10/09/2012  . Tobacco abuse 10/09/2012  . Hypertensive heart disease 08/31/2012  . Accelerated hypertension 08/31/2012  . Precordial pain 08/31/2012    Past Surgical History:  Procedure Laterality Date  . CLOSED REDUCTION MANDIBULAR FRACTURE W/ ARCH BARS     + multiple extractions  . Condyloma resection    . RADIOLOGY WITH ANESTHESIA N/A 11/18/2015   Procedure: RADIOLOGY WITH ANESTHESIA;  Surgeon: Luanne Bras, MD;  Location: [place];  Service: Radiology;  Laterality: N/A;  . Removal foreign body right shoulder    . Right rotator cuff repair    . TEE WITHOUT CARDIOVERSION N/A 11/21/2015   Procedure: TRANSESOPHAGEAL ECHOCARDIOGRAM (TEE);  Surgeon: Larey Dresser, MD;  Location: [place];  Service: Cardiovascular;  Laterality: N/A;       Home Medications    Prior to Admission medications   Medication Sig Start Date End Date Taking? Authorizing Provider  acetaminophen (TYLENOL) 500 MG tablet Take 2 tablets (1,000 mg total) by mouth every 8 (eight) hours as needed. Patient taking differently: Take 500 mg by mouth every 8 (eight) hours as needed for mild pain.  12/20/15   Love, Ivan Anchors, PA-C  amLODipine (NORVASC) 10  MG tablet Take 1 tablet (10 mg total) by mouth daily. 01/19/16   Kirsteins, [name], MD  aspirin 325 MG tablet Take 1 tablet (325 mg total) by mouth daily. 12/20/15   Love, Ivan Anchors, PA-C  atorvastatin (LIPITOR) 40 MG tablet Take 1 tablet (40 mg total) by mouth daily at 6 PM. 01/19/16   Kirsteins, [name], MD  blood glucose meter kit and supplies KIT Dispense based on patient and insurance preference. Use up to four times daily as directed. (FOR ICD-9 250.00, 250.01). 03/18/17   Forde Dandy, MD    chlorthalidone (HYGROTON) 50 MG tablet Take 1 tablet (50 mg total) by mouth daily. Patient taking differently: Take 50 mg by mouth every evening.  01/19/16   Kirsteins, [name], MD  clindamycin (CLEOCIN) 150 MG capsule Take 1 capsule (150 mg total) by mouth every 6 (six) hours. 05/05/17   Fransico Meadow, PA-C  cloNIDine (CATAPRES - DOSED IN MG/24 HR) 0.3 mg/24hr patch Place 1 patch (0.3 mg total) onto the skin once a week. Next change on monday 12/20/15   Love, Ivan Anchors, PA-C  diphenhydramine-acetaminophen (TYLENOL PM) 25-500 MG TABS tablet Take 1-2 tablets by mouth at bedtime as needed (for sleep). Depends on insomnia if takes 1-2 tablets    [provider]  escitalopram (LEXAPRO) 10 MG tablet Take 10 mg by mouth daily.    [provider]  folic acid (FOLVITE) 1 MG tablet Take 1 tablet (1 mg total) by mouth daily. 12/20/15   Bary Leriche, PA-C  glucose blood test strip Use as instructed 03/18/17   Forde Dandy, MD  hydrocerin (EUCERIN) CREA Apply 1 application topically 3 (three) times daily. Available over the counter Patient taking differently: Apply 1 application topically 3 (three) times daily as needed (dry skin). Available over the counter 12/20/15   Love, Ivan Anchors, PA-C  HYDROcodone-acetaminophen (NORCO/VICODIN) 5-325 MG tablet Take 2 tablets by mouth every 4 (four) hours as needed. 05/05/17   Fransico Meadow, PA-C  labetalol (NORMODYNE) 200 MG tablet Take 1 tablet (200 mg total) by mouth 3 (three) times daily. 01/19/16   Kirsteins, [name], MD  magic mouthwash w/lidocaine SOLN Take 5 mLs by mouth 3 (three) times daily as needed for mouth pain. Swish and spit, do not swallow 09/21/17   Triplett, Tammy, PA-C  metFORMIN (GLUCOPHAGE) 500 MG tablet Take 1 tablet (500 mg total) by mouth 2 (two) times daily with a meal. 03/18/17   Forde Dandy, MD  methocarbamol (ROBAXIN) 500 MG tablet Take 2 tablets (1,000 mg total) by mouth 4 (four) times daily as needed for muscle spasms (muscle  spasm/pain). Patient not taking: Reported on 03/12/2017 01/17/17   Francine Graven, DO  Multiple Vitamin (MULTIVITAMIN WITH MINERALS) TABS tablet Take 1 tablet by mouth daily. 12/20/15   Love, Ivan Anchors, PA-C  pantoprazole (PROTONIX) 40 MG tablet Take 1 tablet (40 mg total) by mouth at bedtime. 01/19/16   Kirsteins, [name], MD  potassium chloride SA (K-DUR,KLOR-CON) 20 MEQ tablet Take 1 tablet (20 mEq total) by mouth 2 (two) times daily. 01/19/16   Kirsteins, [name], MD  tiZANidine (ZANAFLEX) 4 MG tablet TAKE (2) TABLETS BY MOUTH AT BEDTIME. 06/13/16   Kirsteins, [name], MD  traZODone (DESYREL) 100 MG tablet Take 1 tablet (100 mg total) by mouth at bedtime. 01/19/16   Kirsteins, [name], MD    Family History Family History  Problem Relation Age of Onset  . Diabetes Mother   .  Hypertension Mother   . Drug abuse Mother   . Hyperlipidemia Mother   . Diabetes Father   . Hypertension Father   . Hyperlipidemia Father   . Diabetes Brother   . Hypertension Brother     Social History Social History   Tobacco Use  . Smoking status: Current Every Day Smoker    Packs/day: 0.50    Years: 30.00    Pack years: 15.00    Types: Cigarettes    Last attempt to quit: 11/17/2015    Years since quitting: 2.1  . Smokeless tobacco: Never Used  Substance Use Topics  . Alcohol use: No    Alcohol/week: 0.0 oz  . Drug use: Yes    Types: Marijuana     Allergies   Oxcarbazepine   Review of Systems Review of Systems  Constitutional:       Per HPI, otherwise negative  HENT:       Per HPI, otherwise negative  Respiratory:       Per HPI, otherwise negative  Cardiovascular:       Per HPI, otherwise negative  Gastrointestinal: Negative for vomiting.  Endocrine:       Negative aside from HPI  Genitourinary:       Neg aside from HPI   Musculoskeletal:       Per HPI, otherwise negative  Skin: Negative.   Allergic/Immunologic: Negative for immunocompromised state.  Neurological: Positive for  weakness and numbness. Negative for syncope.     Physical Exam Updated Vital Signs BP (!) 167/116 (BP Location: Right Arm)   Pulse 83   Temp 98.9 F (37.2 C) (Oral)   Resp 14   Ht 5' 5"  (1.651 m)   Wt 99.8 kg (220 lb)   SpO2 97%   BMI 36.61 kg/m   Physical Exam  Constitutional: He is oriented to person, place, and time. He appears well-developed. No distress.  HENT:  Head: Normocephalic and atraumatic.  Eyes: Conjunctivae and EOM are normal.  Cardiovascular: Normal rate and regular rhythm.  Pulmonary/Chest: Effort normal. No stridor. No respiratory distress.  Abdominal: He exhibits no distension.  Musculoskeletal: He exhibits no edema.  Minimally appreciable swelling left compared to right. Distal pulses are appreciable in the left foot, no discoloration.  Left great toenail is partially avulsed.  Neurological: He is alert and oriented to person, place, and time. He displays atrophy. He displays no tremor. He exhibits abnormal muscle tone.  Patient has atrophy and diminished strength in the left side.  Skin: Skin is warm and dry.  Psychiatric: He has a normal mood and affect.  Nursing note and vitals reviewed.    ED Treatments / Results  Labs (all labs ordered are listed, but only abnormal results are displayed) Labs Reviewed  COMPREHENSIVE METABOLIC PANEL - Abnormal; Notable for the following components:      Result Value   Glucose, Bld 116 (*)    Total Protein 8.3 (*)    All other components within normal limits  CBG MONITORING, ED - Abnormal; Notable for the following components:   Glucose-Capillary 131 (*)    All other components within normal limits  CBC WITH DIFFERENTIAL/PLATELET    EKG  EKG Interpretation None       Radiology US Venous Img Lower  Left (dvt Study)  Result Date: 01/17/2018 CLINICAL DATA:  Left lower extremity pain, swelling EXAM: LEFT LOWER EXTREMITY VENOUS DOPPLER ULTRASOUND TECHNIQUE: Gray-scale sonography with graded compression, as  well as color Doppler and duplex ultrasound were performed  to evaluate the lower extremity deep venous systems from the level of the common femoral vein and including the common femoral, femoral, profunda femoral, popliteal and calf veins including the posterior tibial, peroneal and gastrocnemius veins when visible. The superficial great saphenous vein was also interrogated. Spectral Doppler was utilized to evaluate flow at rest and with distal augmentation maneuvers in the common femoral, femoral and popliteal veins. COMPARISON:  None. FINDINGS: Contralateral Common Femoral Vein: Respiratory phasicity is normal and symmetric with the symptomatic side. No evidence of thrombus. Normal compressibility. Common Femoral Vein: No evidence of thrombus. Normal compressibility, respiratory phasicity and response to augmentation. Saphenofemoral Junction: No evidence of thrombus. Normal compressibility and flow on color Doppler imaging. Profunda Femoral Vein: No evidence of thrombus. Normal compressibility and flow on color Doppler imaging. Femoral Vein: No evidence of thrombus. Normal compressibility, respiratory phasicity and response to augmentation. Popliteal Vein: No evidence of thrombus. Normal compressibility, respiratory phasicity and response to augmentation. Calf Veins: No evidence of thrombus. Normal compressibility and flow on color Doppler imaging. Superficial Great Saphenous Vein: No evidence of thrombus. Normal compressibility. Venous Reflux:  None. Other Findings:  None. IMPRESSION: No evidence of deep venous thrombosis. Electronically Signed   By: Rolm Baptise M.D.   On: 01/17/2018 12:22    Procedures Procedures (including critical care time)  Medications Ordered in ED Medications  ketorolac (TORADOL) 30 MG/ML injection 30 mg (30 mg Intramuscular Given 01/17/18 1432)  gabapentin (NEURONTIN) capsule 100 mg (100 mg Oral Given 01/17/18 1432)     Initial Impression / Assessment and Plan / ED Course  I  have reviewed the triage vital signs and the nursing notes.  Pertinent labs & imaging results that were available during my care of the patient were reviewed by me and considered in my medical decision making (see chart for details).  3:34 PM On repeat exam the patient is awake alert, in no distress per We discussed all findings including reassuring ultrasound, no evidence for DVT, no evidence for infection. Swelling may be secondary to the patient's neurologic sequelae from his prior stroke, versus venous compromise. However, no evidence for acute occlusion, and with preserved arterial pulses, no warmth, no leukocytosis, there is low suspicion for other pathology including infection. Patient counseled on the need to follow-up with primary care, keep his leg elevated above his heart, and discharged in stable condition.  Final Clinical Impressions(s) / ED Diagnoses   Final diagnoses:  Leg edema    ED Discharge Orders        Ordered    gabapentin (NEURONTIN) 100 MG capsule  3 times daily     01/17/18 1536       Carmin Muskrat, MD 01/17/18 1536

## 2018-01-17 NOTE — ED Notes (Signed)
Have updated pt on wait time and rechecked VS. NAD.

## 2018-01-17 NOTE — ED Notes (Signed)
Pt states Toradol has not helped at all.

## 2018-01-17 NOTE — ED Triage Notes (Signed)
Pt complaining of left leg swelling that is related to his diabetes. States on his left foot, his left toenail came off a few days ago and he did not know it. States he is controlling his DM well, but does not know why left leg is swelling. Had a prior stroke in 2016.

## 2018-01-17 NOTE — Discharge Instructions (Signed)
As discussed, your evaluation today has been largely reassuring.  But, it is important that you monitor your condition carefully, and do not hesitate to return to the ED if you develop new, or concerning changes in your condition. ? ?Otherwise, please follow-up with your physician for appropriate ongoing care. ? ?

## 2018-01-20 NOTE — Pre-Procedure Instructions (Signed)
Taylor Obrien  01/20/2018      [facility] - [place], [place] [address] Phone: [phone] Fax: [phone]    Your procedure is scheduled on Fri. Feb. 15.  Report to [facility] Admitting at 6:30 [name]  Call this number if you have problems the morning of surgery:  [phone]   Remember:  Do not eat food or drink liquids after midnight on Thurs. Feb. 14   Take these medicines the morning of surgery with A SIP OF WATER : tylenol or hydrocodone if needed, amlodipine(norvasc), escitalopram (lexapro), gabapentin (neurontin), labetalol (normodyne),            7 days prior to surgery STOP taking any Aspirin(unless otherwise instructed by your surgeon), Aleve, Naproxen, Ibuprofen, Motrin, Advil, Goody's, BC's, all herbal medications, fish oil, and all vitamins                     How to Manage Your Diabetes Before and After Surgery  Why is it important to control my blood sugar before and after surgery? . Improving blood sugar levels before and after surgery helps healing and can limit problems. . A way of improving blood sugar control is eating a healthy diet by: o  Eating less sugar and carbohydrates o  Increasing activity/exercise o  Talking with your doctor about reaching your blood sugar goals . High blood sugars (greater than 180 mg/dL) can raise your risk of infections and slow your recovery, so you will need to focus on controlling your diabetes during the weeks before surgery. . Make sure that the doctor who takes care of your diabetes knows about your planned surgery including the date and location.  How do I manage my blood sugar before surgery? . Check your blood sugar at least 4 times a day, starting 2 days before surgery, to make sure that the level is not too high or low. o Check your blood sugar the morning of your surgery when you wake up and every 2 hours until you get to the Short Stay unit. . If  your blood sugar is less than 70 mg/dL, you will need to treat for low blood sugar: o Do not take insulin. o Treat a low blood sugar (less than 70 mg/dL) with  cup of clear juice (cranberry or apple), 4 glucose tablets, OR glucose gel. Recheck blood sugar in 15 minutes after treatment (to make sure it is greater than 70 mg/dL). If your blood sugar is not greater than 70 mg/dL on recheck, call [phone] o  for further instructions. . Report your blood sugar to the short stay nurse when you get to Short Stay.  . If you are admitted to the hospital after surgery: o Your blood sugar will be checked by the staff and you will probably be given insulin after surgery (instead of oral diabetes medicines) to make sure you have good blood sugar levels. o The goal for blood sugar control after surgery is 80-180 mg/dL.     WHAT DO I DO ABOUT MY DIABETES MEDICATION?   Marland Kitchen Do not take oral diabetes medicines (pills) the morning of surgery.    Do not wear jewelry.  Do not wear lotions, powders, or perfumes, or deodorant.  Do not shave 48 hours prior to surgery.  Men may shave face and neck.  Do not bring valuables to the hospital.  [facility] is not responsible for any  belongings or valuables.  Contacts, dentures or bridgework may not be worn into surgery.  Leave your suitcase in the car.  After surgery it may be brought to your room.  For patients admitted to the hospital, discharge time will be determined by your treatment team.  Patients discharged the day of surgery will not be allowed to drive home.    Special instructions:  [place]- Preparing For Surgery  Before surgery, you can play an important role. Because skin is not sterile, your skin needs to be as free of germs as possible. You can reduce the number of germs on your skin by washing with CHG (chlorahexidine gluconate) Soap before surgery.  CHG is an antiseptic cleaner which kills germs and bonds with the skin to continue  killing germs even after washing.  Please do not use if you have an allergy to CHG or antibacterial soaps. If your skin becomes reddened/irritated stop using the CHG.  Do not shave (including legs and underarms) for at least 48 hours prior to first CHG shower. It is OK to shave your face.  Please follow these instructions carefully.   1. Shower the NIGHT BEFORE SURGERY and the MORNING OF SURGERY with CHG.   2. If you chose to wash your hair, wash your hair first as usual with your normal shampoo.  3. After you shampoo, rinse your hair and body thoroughly to remove the shampoo.  4. Use CHG as you would any other liquid soap. You can apply CHG directly to the skin and wash gently with a scrungie or a clean washcloth.   5. Apply the CHG Soap to your body ONLY FROM THE NECK DOWN.  Do not use on open wounds or open sores. Avoid contact with your eyes, ears, mouth and genitals (private parts). Wash Face and genitals (private parts)  with your normal soap.  6. Wash thoroughly, paying special attention to the area where your surgery will be performed.  7. Thoroughly rinse your body with warm water from the neck down.  8. DO NOT shower/wash with your normal soap after using and rinsing off the CHG Soap.  9. Pat yourself dry with a CLEAN TOWEL.  10. Wear CLEAN PAJAMAS to bed the night before surgery, wear comfortable clothes the morning of surgery  11. Place CLEAN SHEETS on your bed the night of your first shower and DO NOT SLEEP WITH PETS.    Day of Surgery: Do not apply any deodorants/lotions. Please wear clean clothes to the hospital/surgery center.      Please read over the following fact sheets that you were given. Coughing and Deep Breathing

## 2018-01-21 ENCOUNTER — Other Ambulatory Visit: Payer: Self-pay

## 2018-01-21 ENCOUNTER — Encounter (HOSPITAL_COMMUNITY): Payer: Self-pay

## 2018-01-21 ENCOUNTER — Encounter (HOSPITAL_COMMUNITY)
Admission: RE | Admit: 2018-01-21 | Discharge: 2018-01-21 | Disposition: A | Discharge status: Home / Self Care | Payer: Medicaid Other | Source: Ambulatory Visit | Attending: Oral Surgery | Admitting: Oral Surgery

## 2018-01-21 DIAGNOSIS — F17210 Nicotine dependence, cigarettes, uncomplicated: Secondary | ICD-10-CM | POA: Insufficient documentation

## 2018-01-21 DIAGNOSIS — Z7984 Long term (current) use of oral hypoglycemic drugs: Secondary | ICD-10-CM | POA: Diagnosis not present

## 2018-01-21 DIAGNOSIS — G4730 Sleep apnea, unspecified: Secondary | ICD-10-CM | POA: Diagnosis not present

## 2018-01-21 DIAGNOSIS — Z01812 Encounter for preprocedural laboratory examination: Secondary | ICD-10-CM | POA: Diagnosis present

## 2018-01-21 DIAGNOSIS — E119 Type 2 diabetes mellitus without complications: Secondary | ICD-10-CM | POA: Insufficient documentation

## 2018-01-21 DIAGNOSIS — Z6834 Body mass index (BMI) 34.0-34.9, adult: Secondary | ICD-10-CM | POA: Diagnosis not present

## 2018-01-21 DIAGNOSIS — F329 Major depressive disorder, single episode, unspecified: Secondary | ICD-10-CM | POA: Insufficient documentation

## 2018-01-21 DIAGNOSIS — K219 Gastro-esophageal reflux disease without esophagitis: Secondary | ICD-10-CM | POA: Diagnosis not present

## 2018-01-21 DIAGNOSIS — I10 Essential (primary) hypertension: Secondary | ICD-10-CM | POA: Diagnosis not present

## 2018-01-21 DIAGNOSIS — N289 Disorder of kidney and ureter, unspecified: Secondary | ICD-10-CM | POA: Diagnosis not present

## 2018-01-21 DIAGNOSIS — E669 Obesity, unspecified: Secondary | ICD-10-CM | POA: Insufficient documentation

## 2018-01-21 HISTORY — DX: Headache, unspecified: R51.9

## 2018-01-21 HISTORY — DX: Headache: R51

## 2018-01-21 LAB — HEMOGLOBIN A1C
HEMOGLOBIN A1C: 6.5 % — AB (ref 4.8–5.6)
MEAN PLASMA GLUCOSE: 139.85 mg/dL

## 2018-01-21 LAB — GLUCOSE, CAPILLARY: GLUCOSE-CAPILLARY: 123 mg/dL — AB (ref 65–99)

## 2018-01-21 NOTE — Pre-Procedure Instructions (Signed)
Taylor Obrien  01/21/2018      [facility] - [place], [place] [address] Phone: [phone] Fax: [phone]    Your procedure is scheduled on Fri. Feb. 15.  Report to [facility] Admitting at 6:30 [name]  Call this number if you have problems the morning of surgery:  [phone]   Remember:  Do not eat food or drink liquids after midnight on Thurs. Feb. 14   Take these medicines the morning of surgery with A SIP OF WATER : tylenol or hydrocodone if needed, amlodipine(norvasc), escitalopram (lexapro), gabapentin (neurontin), labetalol (normodyne), TAMSULOSIN (FLOMAX)            7 days prior to surgery STOP taking any Aspirin(unless otherwise instructed by your surgeon), Aleve, Naproxen, Ibuprofen, Motrin, Advil, Goody's, BC's, all herbal medications, fish oil, and all vitamins                     How to Manage Your Diabetes Before and After Surgery  Why is it important to control my blood sugar before and after surgery? . Improving blood sugar levels before and after surgery helps healing and can limit problems. . A way of improving blood sugar control is eating a healthy diet by: o  Eating less sugar and carbohydrates o  Increasing activity/exercise o  Talking with your doctor about reaching your blood sugar goals . High blood sugars (greater than 180 mg/dL) can raise your risk of infections and slow your recovery, so you will need to focus on controlling your diabetes during the weeks before surgery. . Make sure that the doctor who takes care of your diabetes knows about your planned surgery including the date and location.  How do I manage my blood sugar before surgery? . Check your blood sugar at least 4 times a day, starting 2 days before surgery, to make sure that the level is not too high or low. o Check your blood sugar the morning of your surgery when you wake up and every 2 hours until you get to the  Short Stay unit. . If your blood sugar is less than 70 mg/dL, you will need to treat for low blood sugar: o Do not take insulin. o Treat a low blood sugar (less than 70 mg/dL) with  cup of clear juice (cranberry or apple), 4 glucose tablets, OR glucose gel. Recheck blood sugar in 15 minutes after treatment (to make sure it is greater than 70 mg/dL). If your blood sugar is not greater than 70 mg/dL on recheck, call [phone] o  for further instructions. . Report your blood sugar to the short stay nurse when you get to Short Stay.  . If you are admitted to the hospital after surgery: o Your blood sugar will be checked by the staff and you will probably be given insulin after surgery (instead of oral diabetes medicines) to make sure you have good blood sugar levels. o The goal for blood sugar control after surgery is 80-180 mg/dL.     WHAT DO I DO ABOUT MY DIABETES MEDICATION?   Marland Kitchen Do not take oral diabetes medicines (pills) the morning of surgery.    Do not wear jewelry.  Do not wear lotions, powders, or perfumes, or deodorant.  Do not shave 48 hours prior to surgery.  Men may shave face and neck.  Do not bring valuables to the hospital.  [facility] is not responsible  for any belongings or valuables.  Contacts, dentures or bridgework may not be worn into surgery.  Leave your suitcase in the car.  After surgery it may be brought to your room.  For patients admitted to the hospital, discharge time will be determined by your treatment team.  Patients discharged the day of surgery will not be allowed to drive home.    Special instructions:  [facility]- Preparing For Surgery  Before surgery, you can play an important role. Because skin is not sterile, your skin needs to be as free of germs as possible. You can reduce the number of germs on your skin by washing with CHG (chlorahexidine gluconate) Soap before surgery.  CHG is an antiseptic cleaner which kills germs and bonds with the  skin to continue killing germs even after washing.  Please do not use if you have an allergy to CHG or antibacterial soaps. If your skin becomes reddened/irritated stop using the CHG.  Do not shave (including legs and underarms) for at least 48 hours prior to first CHG shower. It is OK to shave your face.  Please follow these instructions carefully.   1. Shower the NIGHT BEFORE SURGERY and the MORNING OF SURGERY with CHG.   2. If you chose to wash your hair, wash your hair first as usual with your normal shampoo.  3. After you shampoo, rinse your hair and body thoroughly to remove the shampoo.  4. Use CHG as you would any other liquid soap. You can apply CHG directly to the skin and wash gently with a scrungie or a clean washcloth.   5. Apply the CHG Soap to your body ONLY FROM THE NECK DOWN.  Do not use on open wounds or open sores. Avoid contact with your eyes, ears, mouth and genitals (private parts). Wash Face and genitals (private parts)  with your normal soap.  6. Wash thoroughly, paying special attention to the area where your surgery will be performed.  7. Thoroughly rinse your body with warm water from the neck down.  8. DO NOT shower/wash with your normal soap after using and rinsing off the CHG Soap.  9. Pat yourself dry with a CLEAN TOWEL.  10. Wear CLEAN PAJAMAS to bed the night before surgery, wear comfortable clothes the morning of surgery  11. Place CLEAN SHEETS on your bed the night of your first shower and DO NOT SLEEP WITH PETS.    Day of Surgery: Do not apply any deodorants/lotions. Please wear clean clothes to the hospital/surgery center.      Please read over the following fact sheets that you were given. Coughing and Deep Breathing

## 2018-01-22 NOTE — Progress Notes (Signed)
Anesthesia Chart Review: Patient is a 52 year old male scheduled for multiple teeth extractions with alveoloplasty on 01/23/18 by Dr. Diona Browner. Surgery was initially scheduled for 03/21/17, but PAT labs revealed a glucose of > 500 leading to new diagnosis of DM2. Patient has since been started on DM medications and has gotten established with a PCP.   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 BMI is consistent with obesity.  - PCP is Dr. Lucia Gaskins. - He is not currently followed by a cardiologist, but was seen by Dr. Rozann Lesches on 08/31/12 for evaluation following admission for chest pain. Echo was ordered at that time and decision for additional testing would be based on results and HTN control.  Medical therapy was recommended following 09/02/12 echo. Patient has not been seen by Dr. Domenic Polite since. He did see Dr. Rayann Heman at the time of his 2016 CVA regarding consideration of a loop recorder. He wrote, "At this time, the patient is very clear in their decision to proceed with 30 day monitor. Will order and follow up in the office in 6 weeks to review results. If no AF detected, would recommend ILR at that time." No arrhythmias was found on event monitor. Patient has not been back to see Dr. Rayann Heman.   Meds include amlodipine, aspirin 325 mg (hold unless otherwise instructed by surgeon), Lipitor, chlorthalidone, Catapres, Jardiance, Lexapro, folic acid, gabapentin, Norco, labetalol, Ativan, metformin, Protonix, Flomax, trazodone, Zanaflex.   BP (!) 144/94 Comment: taken in the right arm  Pulse 85   Temp 37.1 C   Resp 18   Ht 5\' 5"  (1.651 m)   Wt 209 lb 1.6 oz (94.8 kg)   SpO2 100%   BMI 34.80 kg/m   EKG 01/21/18: NSR, LVH, inferior infarct (age undetermined), T wave  abnormality, consider lateral ischemia. He has had lateral T wave inversions since at least 10/15/07 and possible inferior infarct has been present since at least 12/14/15. (Multiple EKGs printed and placed in patient's hard chart. I had previously reviewed patient's 03/18/17 EKG with comparisons with anesthesiologist Dr. Oleta Mouse. See my 03/18/17 note.)   Event monitor 12/2015: Study Highlights Sinus rhythm with rare premature ventricular contactions No pauses or significant bradycardic episodes No sustained arrhythmias  TEE 11/21/15: Study Conclusions - Left ventricle: The cavity size was normal. Wall thickness was increased in a pattern of moderate to severe LVH. Systolic function was normal. The estimated ejection fraction was in the range of 55% to 60%. Wall motion was normal; there were no regional wall motion abnormalities. - Aortic valve: There was no stenosis. - Aorta: Normal caliber aorta with minimal plaque. - Mitral valve: There was no significant regurgitation. - Left atrium: The atrium was mildly dilated. No evidence of thrombus in the atrial cavity or appendage. - Right ventricle: The cavity size was normal. Systolic function was normal. - Right atrium: No evidence of thrombus in the atrial cavity or appendage. - Atrial septum: No defect or patent foramen ovale was identified. Echo contrast study showed no right-to-left atrial level shunt, at baseline or with provocation. Impressions: - No cardiac source of emboli was indentified.  Echo (TTE) 11/20/15: Study Conclusions - Left ventricle: The cavity size was normal. There was moderate concentric hypertrophy. Systolic function was normal. The estimated ejection fraction was in the range of 55% to 60%.  There is akinesis of the basalinferior myocardium. Features are consistent with a pseudonormal left ventricular filling pattern, with concomitant abnormal relaxation and increased  filling pressure (grade 2 diastolic dysfunction). Doppler parameters are consistent with high ventricular filling pressure. - Left atrium: The atrium was severely dilated.  Renal artery Duplex 12/01/15: Summary: - No evidence of renal artery stenosis noted bilaterally. - Bilateral normal intrarenal resistive indices.  CTA head/neck 11/18/15: IMPRESSION: 1. Limited by venous dominant contrast timing in the distal neck and head. 2. Anterior division right MCA M2 occlusion strongly suspected. The right right ICA and MCA M1 segment are patent. 3. Bilateral carotid bifurcation atherosclerosis without hemodynamically significant stenosis. Negative arch and CCA origins. 4. Vertebral arteries are poorly evaluated in the neck. No posterior circulation occlusion.  Preoperative labs and 01/17/18 labs noted. CBC WNL. Na 138, K 4.0, Cr 1.22. Glucose 116. A1c 6.5.  [name] EKGs appear stable. Diabetes appears well controlled by A1c. If no acute changes then I would anticipate he can proceed with dental extractions as planned.  George Hugh [facility] Short Stay Center/Anesthesiology Phone [phone] 01/22/2018 10:32 AM

## 2018-01-23 ENCOUNTER — Ambulatory Visit (HOSPITAL_COMMUNITY): Payer: Medicaid Other | Admitting: Emergency Medicine

## 2018-01-23 ENCOUNTER — Encounter (HOSPITAL_COMMUNITY): Payer: Self-pay | Admitting: General Practice

## 2018-01-23 ENCOUNTER — Ambulatory Visit (HOSPITAL_COMMUNITY)
Admission: RE | Admit: 2018-01-23 | Discharge: 2018-01-23 | Disposition: A | Discharge status: Home / Self Care | Payer: Medicaid Other | Source: Ambulatory Visit | Attending: Oral Surgery | Admitting: Oral Surgery

## 2018-01-23 ENCOUNTER — Encounter (HOSPITAL_COMMUNITY)
Admission: RE | Disposition: A | Discharge status: Home / Self Care | Payer: Self-pay | Source: Ambulatory Visit | Attending: Oral Surgery

## 2018-01-23 ENCOUNTER — Other Ambulatory Visit: Payer: Self-pay

## 2018-01-23 ENCOUNTER — Ambulatory Visit (HOSPITAL_COMMUNITY): Payer: Medicaid Other | Admitting: Certified Registered Nurse Anesthetist

## 2018-01-23 DIAGNOSIS — Z7984 Long term (current) use of oral hypoglycemic drugs: Secondary | ICD-10-CM | POA: Insufficient documentation

## 2018-01-23 DIAGNOSIS — I10 Essential (primary) hypertension: Secondary | ICD-10-CM | POA: Diagnosis not present

## 2018-01-23 DIAGNOSIS — E785 Hyperlipidemia, unspecified: Secondary | ICD-10-CM | POA: Diagnosis not present

## 2018-01-23 DIAGNOSIS — F329 Major depressive disorder, single episode, unspecified: Secondary | ICD-10-CM | POA: Insufficient documentation

## 2018-01-23 DIAGNOSIS — G8929 Other chronic pain: Secondary | ICD-10-CM | POA: Diagnosis not present

## 2018-01-23 DIAGNOSIS — Z79899 Other long term (current) drug therapy: Secondary | ICD-10-CM | POA: Diagnosis not present

## 2018-01-23 DIAGNOSIS — K029 Dental caries, unspecified: Secondary | ICD-10-CM | POA: Insufficient documentation

## 2018-01-23 DIAGNOSIS — Z8673 Personal history of transient ischemic attack (TIA), and cerebral infarction without residual deficits: Secondary | ICD-10-CM | POA: Diagnosis not present

## 2018-01-23 DIAGNOSIS — K056 Periodontal disease, unspecified: Secondary | ICD-10-CM | POA: Diagnosis not present

## 2018-01-23 DIAGNOSIS — F430 Acute stress reaction: Secondary | ICD-10-CM | POA: Insufficient documentation

## 2018-01-23 DIAGNOSIS — E119 Type 2 diabetes mellitus without complications: Secondary | ICD-10-CM | POA: Diagnosis not present

## 2018-01-23 DIAGNOSIS — Z7982 Long term (current) use of aspirin: Secondary | ICD-10-CM | POA: Diagnosis not present

## 2018-01-23 DIAGNOSIS — G4730 Sleep apnea, unspecified: Secondary | ICD-10-CM | POA: Diagnosis not present

## 2018-01-23 DIAGNOSIS — Z888 Allergy status to other drugs, medicaments and biological substances status: Secondary | ICD-10-CM | POA: Insufficient documentation

## 2018-01-23 DIAGNOSIS — K219 Gastro-esophageal reflux disease without esophagitis: Secondary | ICD-10-CM | POA: Diagnosis not present

## 2018-01-23 DIAGNOSIS — M1990 Unspecified osteoarthritis, unspecified site: Secondary | ICD-10-CM | POA: Diagnosis not present

## 2018-01-23 HISTORY — PX: MULTIPLE EXTRACTIONS WITH ALVEOLOPLASTY: SHX5342

## 2018-01-23 LAB — GLUCOSE, CAPILLARY: Glucose-Capillary: 127 mg/dL — ABNORMAL HIGH (ref 65–99)

## 2018-01-23 SURGERY — MULTIPLE EXTRACTION WITH ALVEOLOPLASTY
Anesthesia: General | Site: Mouth | Laterality: Bilateral

## 2018-01-23 MED ORDER — PROPOFOL 10 MG/ML IV BOLUS
INTRAVENOUS | Status: AC
  Filled 2018-01-23: qty 20

## 2018-01-23 MED ORDER — MIDAZOLAM HCL 5 MG/5ML IJ SOLN
INTRAMUSCULAR | Status: DC | PRN
  Administered 2018-01-23: 2 mg via INTRAVENOUS

## 2018-01-23 MED ORDER — SUCCINYLCHOLINE CHLORIDE 200 MG/10ML IV SOSY
PREFILLED_SYRINGE | INTRAVENOUS | Status: AC
  Filled 2018-01-23: qty 10

## 2018-01-23 MED ORDER — METOCLOPRAMIDE HCL 5 MG/ML IJ SOLN: 10.0000 mg | Freq: Once | INTRAMUSCULAR | Status: DC | PRN

## 2018-01-23 MED ORDER — ROCURONIUM BROMIDE 10 MG/ML (PF) SYRINGE
PREFILLED_SYRINGE | INTRAVENOUS | Status: AC
  Filled 2018-01-23: qty 5

## 2018-01-23 MED ORDER — DEXAMETHASONE SODIUM PHOSPHATE 10 MG/ML IJ SOLN
INTRAMUSCULAR | Status: AC
  Filled 2018-01-23: qty 1

## 2018-01-23 MED ORDER — OXYMETAZOLINE HCL 0.05 % NA SOLN
NASAL | Status: DC | PRN
  Administered 2018-01-23: 2 via NASAL

## 2018-01-23 MED ORDER — 0.9 % SODIUM CHLORIDE (POUR BTL) OPTIME
TOPICAL | Status: DC | PRN
  Administered 2018-01-23: 1000 mL

## 2018-01-23 MED ORDER — ESMOLOL HCL 100 MG/10ML IV SOLN
INTRAVENOUS | Status: DC | PRN
  Administered 2018-01-23: 20 mg via INTRAVENOUS

## 2018-01-23 MED ORDER — EPHEDRINE 5 MG/ML INJ
INTRAVENOUS | Status: AC
Start: 2018-01-23 — End: ?
  Filled 2018-01-23: qty 10

## 2018-01-23 MED ORDER — AMOXICILLIN 500 MG PO CAPS: 500.0000 mg | ORAL_CAPSULE | Freq: Three times a day (TID) | ORAL | 0 refills | Status: DC

## 2018-01-23 MED ORDER — FENTANYL CITRATE (PF) 100 MCG/2ML IJ SOLN
INTRAMUSCULAR | Status: DC | PRN
  Administered 2018-01-23: 50 ug via INTRAVENOUS
  Administered 2018-01-23: 100 ug via INTRAVENOUS

## 2018-01-23 MED ORDER — FENTANYL CITRATE (PF) 100 MCG/2ML IJ SOLN: 25.0000 ug | INTRAMUSCULAR | Status: DC | PRN

## 2018-01-23 MED ORDER — DEXTROSE 5 % IV SOLN
INTRAVENOUS | Status: DC | PRN
  Administered 2018-01-23: 30 ug/min via INTRAVENOUS

## 2018-01-23 MED ORDER — SUGAMMADEX SODIUM 200 MG/2ML IV SOLN
INTRAVENOUS | Status: DC | PRN
  Administered 2018-01-23: 200 mg via INTRAVENOUS

## 2018-01-23 MED ORDER — MEPERIDINE HCL 50 MG/ML IJ SOLN: 6.2500 mg | INTRAMUSCULAR | Status: DC | PRN

## 2018-01-23 MED ORDER — PHENYLEPHRINE 40 MCG/ML (10ML) SYRINGE FOR IV PUSH (FOR BLOOD PRESSURE SUPPORT)
PREFILLED_SYRINGE | INTRAVENOUS | Status: AC
  Filled 2018-01-23: qty 10

## 2018-01-23 MED ORDER — ROCURONIUM BROMIDE 100 MG/10ML IV SOLN
INTRAVENOUS | Status: DC | PRN
  Administered 2018-01-23: 70 mg via INTRAVENOUS

## 2018-01-23 MED ORDER — LIDOCAINE HCL (CARDIAC) 20 MG/ML IV SOLN
INTRAVENOUS | Status: DC | PRN
  Administered 2018-01-23: 100 mg via INTRAVENOUS

## 2018-01-23 MED ORDER — FENTANYL CITRATE (PF) 250 MCG/5ML IJ SOLN
INTRAMUSCULAR | Status: AC
  Filled 2018-01-23: qty 5

## 2018-01-23 MED ORDER — CHLORHEXIDINE GLUCONATE CLOTH 2 % EX PADS: 6.0000 | MEDICATED_PAD | Freq: Once | CUTANEOUS | Status: DC

## 2018-01-23 MED ORDER — PROPOFOL 10 MG/ML IV BOLUS
INTRAVENOUS | Status: DC | PRN
  Administered 2018-01-23: 150 mg via INTRAVENOUS
  Administered 2018-01-23: 50 mg via INTRAVENOUS

## 2018-01-23 MED ORDER — OXYCODONE-ACETAMINOPHEN 10-325 MG PO TABS: 1.0000 | ORAL_TABLET | Freq: Four times a day (QID) | ORAL | 0 refills | Status: DC | PRN

## 2018-01-23 MED ORDER — LIDOCAINE-EPINEPHRINE 2 %-1:100000 IJ SOLN
INTRAMUSCULAR | Status: AC
  Filled 2018-01-23: qty 3

## 2018-01-23 MED ORDER — SODIUM CHLORIDE 0.9 % IR SOLN
Status: DC | PRN
  Administered 2018-01-23: 1000 mL

## 2018-01-23 MED ORDER — ONDANSETRON HCL 4 MG/2ML IJ SOLN
INTRAMUSCULAR | Status: DC | PRN
  Administered 2018-01-23: 4 mg via INTRAVENOUS

## 2018-01-23 MED ORDER — LACTATED RINGERS IV SOLN: INTRAVENOUS | Status: DC

## 2018-01-23 MED ORDER — MIDAZOLAM HCL 2 MG/2ML IJ SOLN
INTRAMUSCULAR | Status: AC
  Filled 2018-01-23: qty 2

## 2018-01-23 MED ORDER — CEFAZOLIN SODIUM-DEXTROSE 2-4 GM/100ML-% IV SOLN
2.0000 g | INTRAVENOUS | Status: AC
  Administered 2018-01-23: 2 g via INTRAVENOUS
  Filled 2018-01-23: qty 100

## 2018-01-23 MED ORDER — OXYMETAZOLINE HCL 0.05 % NA SOLN
NASAL | Status: AC
  Filled 2018-01-23: qty 45

## 2018-01-23 MED ORDER — OXYMETAZOLINE HCL 0.05 % NA SOLN
NASAL | Status: DC | PRN
  Administered 2018-01-23: 1

## 2018-01-23 MED ORDER — LIDOCAINE 2% (20 MG/ML) 5 ML SYRINGE
INTRAMUSCULAR | Status: AC
  Filled 2018-01-23: qty 5

## 2018-01-23 MED ORDER — ONDANSETRON HCL 4 MG/2ML IJ SOLN
INTRAMUSCULAR | Status: AC
  Filled 2018-01-23: qty 2

## 2018-01-23 MED ORDER — PHENYLEPHRINE HCL 10 MG/ML IJ SOLN
INTRAMUSCULAR | Status: DC | PRN
  Administered 2018-01-23 (×2): 80 ug via INTRAVENOUS

## 2018-01-23 MED ORDER — SUGAMMADEX SODIUM 200 MG/2ML IV SOLN
INTRAVENOUS | Status: AC
Start: 2018-01-23 — End: ?
  Filled 2018-01-23: qty 2

## 2018-01-23 MED ORDER — LIDOCAINE-EPINEPHRINE 2 %-1:100000 IJ SOLN
INTRAMUSCULAR | Status: DC | PRN
  Administered 2018-01-23: 20 mL

## 2018-01-23 MED ORDER — LACTATED RINGERS IV SOLN
INTRAVENOUS | Status: DC
  Administered 2018-01-23: 07:00:00 via INTRAVENOUS

## 2018-01-23 SURGICAL SUPPLY — 31 items
BUR CROSS CUT FISSURE 1.6 (BURR) ×2 IMPLANT
BUR CROSS CUT FISSURE 1.6MM (BURR) ×1
BUR EGG ELITE 4.0 (BURR) ×2 IMPLANT
BUR EGG ELITE 4.0MM (BURR) ×1
CANISTER SUCT 3000ML PPV (MISCELLANEOUS) ×3 IMPLANT
COVER SURGICAL LIGHT HANDLE (MISCELLANEOUS) ×3 IMPLANT
CRADLE DONUT ADULT HEAD (MISCELLANEOUS) ×3 IMPLANT
DECANTER SPIKE VIAL GLASS SM (MISCELLANEOUS) ×2 IMPLANT
DRAPE U-SHAPE 76X120 STRL (DRAPES) ×3 IMPLANT
FLUID NSS /IRRIG 1000 ML XXX (MISCELLANEOUS) ×3 IMPLANT
GAUZE PACKING FOLDED 2  STR (GAUZE/BANDAGES/DRESSINGS) ×2
GAUZE PACKING FOLDED 2 STR (GAUZE/BANDAGES/DRESSINGS) ×1 IMPLANT
GLOVE BIO SURGEON STRL SZ 6.5 (GLOVE) ×2 IMPLANT
GLOVE BIO SURGEON STRL SZ7.5 (GLOVE) ×3 IMPLANT
GLOVE BIO SURGEONS STRL SZ 6.5 (GLOVE) ×1
GLOVE BIOGEL PI IND STRL 7.0 (GLOVE) ×1 IMPLANT
GLOVE BIOGEL PI INDICATOR 7.0 (GLOVE) ×2
GOWN STRL REUS W/ TWL LRG LVL3 (GOWN DISPOSABLE) ×1 IMPLANT
GOWN STRL REUS W/ TWL XL LVL3 (GOWN DISPOSABLE) ×1 IMPLANT
GOWN STRL REUS W/TWL LRG LVL3 (GOWN DISPOSABLE) ×3
GOWN STRL REUS W/TWL XL LVL3 (GOWN DISPOSABLE) ×3
KIT BASIN OR (CUSTOM PROCEDURE TRAY) ×3 IMPLANT
KIT TURNOVER KIT B (KITS) ×2 IMPLANT
NEEDLE 22X1 1/2 (OR ONLY) (NEEDLE) ×6 IMPLANT
NS IRRIG 1000ML POUR BTL (IV SOLUTION) ×3 IMPLANT
PAD ARMBOARD 7.5X6 YLW CONV (MISCELLANEOUS) ×3 IMPLANT
SUT CHROMIC 3 0 PS 2 (SUTURE) ×6 IMPLANT
SYR CONTROL 10ML LL (SYRINGE) ×5 IMPLANT
TRAY ENT MC OR (CUSTOM PROCEDURE TRAY) ×3 IMPLANT
TUBING IRRIGATION (MISCELLANEOUS) ×3 IMPLANT
YANKAUER SUCT BULB TIP NO VENT (SUCTIONS) ×3 IMPLANT

## 2018-01-23 NOTE — Op Note (Signed)
01/23/2018  8:43 AM  PATIENT:  Taylor Obrien  52 y.o. male  PRE-OPERATIVE DIAGNOSIS:  NON RESTORABLE TEETH NUMBER ONE, TWO, THREE, FOUR, FIVE, SIX, SEVEN, EIGHT, NINE, TEN, ELEVEN, TWELVE, THIRTEEN, FOURTEEN, FIFTEEN, SIXTEEN, SEVENTEEN, TWENTY, TWENTY-ONE, TWENTY-TWO, TWENTY-THREE, TWENTY-FOUR, TWENTY-FIVE, TWENTY-SIX, TWENTY-SEVEN, TWENTY-EIGHT, TWENTY-NINE, THIRTY-TWO  POST-OPERATIVE DIAGNOSIS:  SAME  PROCEDURE:  Procedure(s): DENTAL EXTRACTION OF TEETH NUMBER ONE, TWO, THREE, FOUR, FIVE, SIX, SEVEN, EIGHT, NINE, TEN, ELEVEN, TWELVE, THIRTEEN, FOURTEEN, FIFTEEN, SIXTEEN, SEVENTEEN, TWENTY, TWENTY-ONE, TWENTY-TWO, TWENTY-THREE, TWENTY-FOUR, TWENTY-FIVE, TWENTY-SIX, TWENTY-SEVEN, TWENTY-EIGHT, TWENTY-NINE, THIRTY-TWO WITH ALVEOLOPLASTY  SURGEON:  Surgeon(s): Diona Browner, DDS  ANESTHESIA:   local and general  EBL:  minimal  DRAINS: none   SPECIMEN:  No Specimen  COUNTS:  YES  PLAN OF CARE: Discharge to home after PACU  PATIENT DISPOSITION:  PACU - hemodynamically stable.   PROCEDURE DETAILS: Dictation # [id number]  Gae Bon, DMD 01/23/2018 8:43 AM

## 2018-01-23 NOTE — Anesthesia Preprocedure Evaluation (Signed)
Anesthesia Evaluation  Patient identified by MRN, date of birth, ID band Patient awake    Reviewed: Allergy & Precautions, NPO status , [name] Chart, lab work & pertinent test results  Airway Mallampati: II  TM Distance: >3 FB Neck ROM: Full    Dental no notable dental hx. (+) Poor Dentition, Loose, Missing, Chipped   Pulmonary sleep apnea and Continuous Positive Airway Pressure Ventilation , Current Smoker,    Pulmonary exam normal breath sounds clear to auscultation       Cardiovascular hypertension, Pt. on medications Normal cardiovascular exam Rhythm:Regular Rate:Normal     Neuro/Psych CVA, Residual Symptoms negative neurological ROS  negative psych ROS   GI/Hepatic Neg liver ROS, GERD  Poorly Controlled,  Endo/Other  diabetes, Type 2, Oral Hypoglycemic Agents  Renal/GU negative Renal ROS  negative genitourinary   Musculoskeletal negative musculoskeletal ROS (+)   Abdominal   Peds negative pediatric ROS (+)  Hematology negative hematology ROS (+)   Anesthesia Other Findings   Reproductive/Obstetrics negative OB ROS                             Anesthesia Physical Anesthesia Plan  ASA: III  Anesthesia Plan: General   Post-op Pain Management:    Induction: Intravenous  PONV Risk Score and Plan: 1 and Ondansetron and Treatment may vary due to age or medical condition  Airway Management Planned: Oral ETT  Additional Equipment:   Intra-op Plan:   Post-operative Plan: Extubation in OR  Informed Consent: I have reviewed the patients History and Physical, chart, labs and discussed the procedure including the risks, benefits and alternatives for the proposed anesthesia with the patient or authorized representative who has indicated his/her understanding and acceptance.   Dental advisory given  Plan Discussed with: CRNA  Anesthesia Plan Comments:         Anesthesia  Quick Evaluation

## 2018-01-23 NOTE — Transfer of Care (Signed)
Immediate Anesthesia Transfer of Care Note  Patient: Taylor Obrien  Procedure(s) Performed: DENTAL EXTRACTION OF TEETH NUMBER ONE, TWO, THREE, FOUR, FIVE, SIX, SEVEN, EIGHT, NINE, TEN, ELEVEN, TWELVE, THIRTEEN, FOURTEEN, FIFTEEN, SIXTEEN, SEVENTEEN, TWENTY, TWENTY-ONE, TWENTY-TWO, TWENTY-THREE, TWENTY-FOUR, TWENTY-FIVE, TWENTY-SIX, TWENTY-SEVEN, TWENTY-EIGHT, TWENTY-NINE, THIRTY-TWO WITH ALVEOLOPLASTY (Bilateral Mouth)  Patient Location: PACU  Anesthesia Type:General  Level of Consciousness: awake, alert , oriented and patient cooperative  Airway & Oxygen Therapy: Patient Spontanous Breathing and Patient connected to face mask oxygen  Post-op Assessment: Report given to RN and Post -op Vital signs reviewed and stable  Post vital signs: Reviewed and stable  Last Vitals:  Vitals:   01/23/18 0613  BP: (!) 144/94  Pulse: 83  Resp: 20  Temp: 36.7 C  SpO2: 95%    Last Pain:  Vitals:   01/23/18 0656  TempSrc:   PainSc: 5       Patients Stated Pain Goal: 3 (14/70/92 9574)  Complications: No apparent anesthesia complications

## 2018-01-23 NOTE — H&P (Signed)
HISTORY AND PHYSICAL  Taylor Obrien is a 52 y.o. male patient with CC: painful teeth. Referred by general dentist for full mouth extractions.  No diagnosis found.  Past Medical History:  Diagnosis Date  . Arthritis   . Chronic pain   . Depression   . Diabetes mellitus without complication ([place])   . Essential hypertension, benign   . GERD (gastroesophageal reflux disease)   . Headache   . HTN (hypertension) 11/27/2015  . Hyperlipidemia   . Sleep apnea    cpap   . Stroke [facility])    2016    Current Facility-Administered Medications  Medication Dose Route Frequency Provider Last Rate Last Dose  . ceFAZolin (ANCEF) IVPB 2g/100 mL premix  2 g Intravenous On Call to [place], [place], DDS      . Chlorhexidine Gluconate Cloth 2 % PADS 6 each  6 each Topical Once Diona Browner, DDS       And  . Chlorhexidine Gluconate Cloth 2 % PADS 6 each  6 each Topical Once Diona Browner, DDS      . lactated ringers infusion   Intravenous Continuous Montez Hageman, MD 10 mL/hr at 01/23/18 8182     Allergies  Allergen Reactions  . Oxcarbazepine Other (See Comments)    Patient goes out of right state of mind.    Active Problems:   * No active hospital problems. *  Vitals: Blood pressure (!) 144/94, pulse 83, temperature 98.1 F (36.7 C), temperature source Oral, resp. rate 20, height 5\' 5"  (1.651 m), weight 209 lb 1.6 oz (94.8 kg), SpO2 95 %. Lab results: Results for orders placed or performed during the hospital encounter of 01/23/18 (from the past 24 hour(s))  Glucose, capillary     Status: Abnormal   Collection Time: 01/23/18  6:11 AM  Result Value Ref Range   Glucose-Capillary 127 (H) 65 - 99 mg/dL   Radiology Results: No results found. General appearance: alert, cooperative and mildly obese Head: Normocephalic, without obvious abnormality, atraumatic Eyes: conjunctivae/corneas clear. PERRL, EOM's intact. Fundi benign. Nose: Nares normal. Septum midline. Mucosa normal. No drainage or  sinus tenderness. Throat: severe dental caries and periodontitis. no purulence, masses, trismus Neck: no adenopathy, supple, symmetrical, trachea midline and thyroid not enlarged, symmetric, no tenderness/mass/nodules Resp: clear to auscultation bilaterally Cardio: regular rate and rhythm, S1, S2 normal, no murmur, click, rub or gallop  Assessment: all teeth nonrestorable secondary to dental caries  Plan: full mouth extractions with alveoloplasty. Ga. Nasal. Day surgery.   Diona Browner 01/23/2018

## 2018-01-23 NOTE — Op Note (Signed)
NAME:  Taylor Obrien, Taylor Obrien NO.:  MEDICAL RECORD NO.:  [id number]  LOCATION:                                 FACILITY:  PHYSICIAN:  Gae Bon, M.D.       DATE OF BIRTH:  DATE OF PROCEDURE:  01/23/2018 DATE OF DISCHARGE:                              OPERATIVE REPORT   PREOPERATIVE DIAGNOSES:  Non-restorable teeth numbers 1, 2, 3, 4, 5, 6, 7, 8, 9, 10, 11, 12, 13, 14, 15, 16, 17, 20, 21, 22, 23, 24, 25, 26, 27, 28, 29, and 32 secondary to dental caries and periodontal disease.  POSTOPERATIVE DIAGNOSES:  Non-restorable teeth numbers 1, 2, 3, 4, 5, 6, 7, 8, 9, 10, 11, 12, 13, 14, 15, 16, 17, 20, 21, 22, 23, 24, 25, 26, 27, 28, 29, and 32 secondary to dental caries and periodontal disease.  PROCEDURES PERFORMED: 1. Extraction of teeth numbers 1, 2, 3, 4, 5, 6, 7, 8, 9, 10, 11, 12,     13, 14, 15, 16, 17, 20, 21, 22, 23, 24, 25, 26, 27, 28, 29, and 32. 2. Alveoloplasty of the right and left maxillae and mandible.  SURGEON:  Gae Bon, MD.  ANESTHESIA:  General nasal intubation.  PROCEDURE IN DETAIL:  The patient was taken to the operating room, and placed on the table in supine position.  General anesthesia was induced and a nasal endotracheal tube was placed and secured.  The eyes were protected and the patient was draped for surgery.  Time-out was performed.  The posterior pharynx was suctioned and a throat pack was placed.  The 2% lidocaine with 1:100,000 epinephrine was infiltrated in an inferior alveolar block in the right and left mandible and in buccal and palatal infiltration around the maxilla.  Additional anesthetic solution was given in the anterior mandible in the buccal fold.  A total of 20 mL was utilized.  A bite block was placed on the right side of the mouth, and a sweetheart retractor was used to retract the tongue.  A #15-blade was used to make an incision around teeth numbers 17, 20, 21, 22, 23, 24, 25, and 26 on the buccal  and lingual aspects in the gingival sulcus.  In the maxilla, an incision was made beginning at tooth #16, both buccally and palatally in the gingival sulcus until tooth # 7 was encountered.  The periosteum was reflected from around these teeth.  The teeth were elevated with a 301 elevator.  Teeth numbers 17, 20, 21, 23, 24, 25, and 26 were removed with the lower forceps.  Tooth #22 fractured upon attempted removal. The periosteum was further reflected, and then bone was removed with a Stryker handpiece and a fissure bur until the tooth was removed.  Then, in the maxilla, the 301 elevator was used to loosen the teeth and elevate them.  Then, teeth numbers 16, 14, 10, 9, 8, and 7 were removed with a dental forceps.  Teeth numbers 15, 13, 12, and 11 fractured upon attempted removal with the forceps.  So, additional bone was removed to remove roots and root fragments of these teeth.  When these teeth were removed, the sockets were curetted in the left maxilla and mandible. The periosteum was reflected to expose the alveolar crest, and then alveoloplasty was performed using an egg-shaped bur and bone file.  The areas were then irrigated and closed with 3-0 chromic.  The bite block and sweetheart retractor were repositioned into the other side of the mouth.  A #15-blade was used to make an incision around teeth numbers 1 through 6 buccally and gingivally, and around teeth numbers 27, 28, 29, and then carried proximally along the alveolar crest gingiva until tooth #32 was encountered and the sulcus was incised buccally and lingually around this tooth.  Then, the periosteal elevator was used to reflect the periosteum from around the teeth in the left maxilla and mandible.  The teeth were elevated with a 301 elevator, and then teeth numbers 32, 29, and 28 were removed with the dental forceps. Tooth #27 fractured upon attempted removal, and so the Stryker handpiece was used to remove bone around  the tooth circumferentially until the tooth was removed with the forceps.  The maxillae of teeth numbers 1, 2, and 3 were removed with a forceps.  Teeth numbers 4, 5, and 6 fractured upon attempted removal with the forceps.  So, bone was removed to allow for removal of roots and root fragments of these 3 teeth.  Then, the areas were curetted.  The periosteum was reflected to expose the alveolar crest, and the tissue was trimmed to allow for closure.  Then, alveoloplasty was performed in the right maxilla and mandible using the egg-shaped bur and bone file.  Then, the areas were irrigated and closed with 3-0 chromic.  The oral cavity was then inspected and found to have good contour, hemostasis, and closure.  The oral cavity was irrigated and suctioned.  The throat pack was removed.  The patient was left in the care of Anesthesia for extubation and transportation to recovery room with plans for discharge to day surgery.  ESTIMATED BLOOD LOSS:  Minimal.  COMPLICATIONS:  None.  SPECIMENS:  None.     Gae Bon, M.D.     SMJ/MEDQ  D:  01/23/2018  T:  01/23/2018  Job:  [id number]

## 2018-01-23 NOTE — Anesthesia Postprocedure Evaluation (Signed)
Anesthesia Post Note  Patient: Taylor Obrien  Procedure(s) Performed: DENTAL EXTRACTION OF TEETH NUMBER ONE, TWO, THREE, FOUR, FIVE, SIX, SEVEN, EIGHT, NINE, TEN, ELEVEN, TWELVE, THIRTEEN, FOURTEEN, FIFTEEN, SIXTEEN, SEVENTEEN, TWENTY, TWENTY-ONE, TWENTY-TWO, TWENTY-THREE, TWENTY-FOUR, TWENTY-FIVE, TWENTY-SIX, TWENTY-SEVEN, TWENTY-EIGHT, TWENTY-NINE, THIRTY-TWO WITH ALVEOLOPLASTY (Bilateral Mouth)     Patient location during evaluation: PACU Anesthesia Type: General Level of consciousness: awake and alert Pain management: pain level controlled Vital Signs Assessment: post-procedure vital signs reviewed and stable Respiratory status: spontaneous breathing, nonlabored ventilation, respiratory function stable and patient connected to nasal cannula oxygen Cardiovascular status: blood pressure returned to baseline and stable Postop Assessment: no apparent nausea or vomiting Anesthetic complications: no    Last Vitals:  Vitals:   01/23/18 0920 01/23/18 0933  BP: (!) 146/85 (!) 147/88  Pulse: 82 81  Resp: 13 15  Temp: (!) 36.2 C (!) 36.3 C  SpO2: 97% 94%    Last Pain:  Vitals:   01/23/18 0933  TempSrc:   PainSc: 0-No pain                 Montez Hageman

## 2018-01-23 NOTE — Anesthesia Procedure Notes (Signed)
Procedure Name: Intubation Date/Time: 01/23/2018 7:43 AM Performed by: Shirlyn Goltz, CRNA Pre-anesthesia Checklist: Patient identified, Emergency Drugs available, Suction available and Patient being monitored Patient Re-evaluated:Patient Re-evaluated prior to induction Oxygen Delivery Method: Circle system utilized Preoxygenation: Pre-oxygenation with 100% oxygen Induction Type: IV induction Ventilation: Mask ventilation without difficulty Laryngoscope Size: Mac and 4 Grade View: Grade I Nasal Tubes: Right and Nasal prep performed Tube size: 7.0 mm Number of attempts: 1 Placement Confirmation: ETT inserted through vocal cords under direct vision,  positive ETCO2 and breath sounds checked- equal and bilateral Tube secured with: Tape Dental Injury: Teeth and Oropharynx as per pre-operative assessment

## 2018-01-24 ENCOUNTER — Emergency Department (HOSPITAL_COMMUNITY): Payer: Medicaid Other

## 2018-01-24 ENCOUNTER — Observation Stay (HOSPITAL_COMMUNITY)
Admission: EM | Admit: 2018-01-24 | Discharge: 2018-01-25 | Disposition: A | Discharge status: Home / Self Care | Payer: Medicaid Other | Attending: Oral Surgery | Admitting: Oral Surgery

## 2018-01-24 ENCOUNTER — Emergency Department (HOSPITAL_COMMUNITY): Payer: Medicaid Other | Admitting: Anesthesiology

## 2018-01-24 ENCOUNTER — Encounter (HOSPITAL_COMMUNITY): Payer: Self-pay | Admitting: Emergency Medicine

## 2018-01-24 ENCOUNTER — Other Ambulatory Visit: Payer: Self-pay

## 2018-01-24 ENCOUNTER — Encounter (HOSPITAL_COMMUNITY)
Admission: EM | Disposition: A | Discharge status: Home / Self Care | Payer: Self-pay | Source: Home / Self Care | Attending: Emergency Medicine

## 2018-01-24 DIAGNOSIS — E785 Hyperlipidemia, unspecified: Secondary | ICD-10-CM | POA: Diagnosis not present

## 2018-01-24 DIAGNOSIS — Z7984 Long term (current) use of oral hypoglycemic drugs: Secondary | ICD-10-CM | POA: Insufficient documentation

## 2018-01-24 DIAGNOSIS — Z6834 Body mass index (BMI) 34.0-34.9, adult: Secondary | ICD-10-CM | POA: Insufficient documentation

## 2018-01-24 DIAGNOSIS — G8929 Other chronic pain: Secondary | ICD-10-CM | POA: Insufficient documentation

## 2018-01-24 DIAGNOSIS — K068 Other specified disorders of gingiva and edentulous alveolar ridge: Secondary | ICD-10-CM

## 2018-01-24 DIAGNOSIS — Z888 Allergy status to other drugs, medicaments and biological substances status: Secondary | ICD-10-CM | POA: Insufficient documentation

## 2018-01-24 DIAGNOSIS — F329 Major depressive disorder, single episode, unspecified: Secondary | ICD-10-CM | POA: Diagnosis not present

## 2018-01-24 DIAGNOSIS — K91840 Postprocedural hemorrhage and hematoma of a digestive system organ or structure following a digestive system procedure: Principal | ICD-10-CM | POA: Insufficient documentation

## 2018-01-24 DIAGNOSIS — F17200 Nicotine dependence, unspecified, uncomplicated: Secondary | ICD-10-CM | POA: Diagnosis not present

## 2018-01-24 DIAGNOSIS — E119 Type 2 diabetes mellitus without complications: Secondary | ICD-10-CM | POA: Insufficient documentation

## 2018-01-24 DIAGNOSIS — Y838 Other surgical procedures as the cause of abnormal reaction of the patient, or of later complication, without mention of misadventure at the time of the procedure: Secondary | ICD-10-CM | POA: Insufficient documentation

## 2018-01-24 DIAGNOSIS — I10 Essential (primary) hypertension: Secondary | ICD-10-CM | POA: Diagnosis not present

## 2018-01-24 DIAGNOSIS — Z8673 Personal history of transient ischemic attack (TIA), and cerebral infarction without residual deficits: Secondary | ICD-10-CM | POA: Insufficient documentation

## 2018-01-24 DIAGNOSIS — K219 Gastro-esophageal reflux disease without esophagitis: Secondary | ICD-10-CM | POA: Diagnosis not present

## 2018-01-24 DIAGNOSIS — Z79899 Other long term (current) drug therapy: Secondary | ICD-10-CM | POA: Diagnosis not present

## 2018-01-24 DIAGNOSIS — G4730 Sleep apnea, unspecified: Secondary | ICD-10-CM | POA: Insufficient documentation

## 2018-01-24 DIAGNOSIS — Z7982 Long term (current) use of aspirin: Secondary | ICD-10-CM | POA: Insufficient documentation

## 2018-01-24 DIAGNOSIS — Z9889 Other specified postprocedural states: Secondary | ICD-10-CM

## 2018-01-24 HISTORY — PX: TOOTH EXTRACTION: SHX859

## 2018-01-24 LAB — BASIC METABOLIC PANEL
Anion gap: 12 (ref 5–15)
BUN: 15 mg/dL (ref 6–20)
CO2: 25 mmol/L (ref 22–32)
Calcium: 9.2 mg/dL (ref 8.9–10.3)
Chloride: 100 mmol/L — ABNORMAL LOW (ref 101–111)
Creatinine, Ser: 1.29 mg/dL — ABNORMAL HIGH (ref 0.61–1.24)
Glucose, Bld: 158 mg/dL — ABNORMAL HIGH (ref 65–99)
POTASSIUM: 3.7 mmol/L (ref 3.5–5.1)
SODIUM: 137 mmol/L (ref 135–145)

## 2018-01-24 LAB — CBC WITH DIFFERENTIAL/PLATELET
BASOS ABS: 0 10*3/uL (ref 0.0–0.1)
Basophils Relative: 0 %
EOS ABS: 0.1 10*3/uL (ref 0.0–0.7)
EOS PCT: 1 %
HCT: 47.1 % (ref 39.0–52.0)
Hemoglobin: 15.4 g/dL (ref 13.0–17.0)
Lymphocytes Relative: 13 %
Lymphs Abs: 1.4 10*3/uL (ref 0.7–4.0)
MCH: 30.9 pg (ref 26.0–34.0)
MCHC: 32.7 g/dL (ref 30.0–36.0)
MCV: 94.4 fL (ref 78.0–100.0)
Monocytes Absolute: 1.2 10*3/uL — ABNORMAL HIGH (ref 0.1–1.0)
Monocytes Relative: 11 %
NEUTROS PCT: 75 %
Neutro Abs: 8.2 10*3/uL — ABNORMAL HIGH (ref 1.7–7.7)
PLATELETS: 199 10*3/uL (ref 150–400)
RBC: 4.99 MIL/uL (ref 4.22–5.81)
RDW: 14.5 % (ref 11.5–15.5)
WBC: 10.8 10*3/uL — AB (ref 4.0–10.5)

## 2018-01-24 LAB — GLUCOSE, CAPILLARY
GLUCOSE-CAPILLARY: 131 mg/dL — AB (ref 65–99)
Glucose-Capillary: 163 mg/dL — ABNORMAL HIGH (ref 65–99)
Glucose-Capillary: 190 mg/dL — ABNORMAL HIGH (ref 65–99)

## 2018-01-24 LAB — HEMOGLOBIN AND HEMATOCRIT, BLOOD
HCT: 45.2 % (ref 39.0–52.0)
HEMOGLOBIN: 15.4 g/dL (ref 13.0–17.0)

## 2018-01-24 LAB — PROTIME-INR
INR: 0.91
Prothrombin Time: 12.2 seconds (ref 11.4–15.2)

## 2018-01-24 SURGERY — EXTRACTION, TOOTH, MOLAR
Anesthesia: General | Site: Mouth

## 2018-01-24 MED ORDER — PROPOFOL 10 MG/ML IV BOLUS
INTRAVENOUS | Status: AC
  Filled 2018-01-24: qty 20

## 2018-01-24 MED ORDER — "TRANEXAMIC ACID 5% ORAL SOLUTION "
10.0000 mL | Freq: Once | ORAL | Status: AC
  Administered 2018-01-24: 10 mL via OROMUCOSAL
  Filled 2018-01-24: qty 10

## 2018-01-24 MED ORDER — OXYMETAZOLINE HCL 0.05 % NA SOLN
NASAL | Status: AC
  Filled 2018-01-24: qty 15

## 2018-01-24 MED ORDER — TAMSULOSIN HCL 0.4 MG PO CAPS
0.4000 mg | ORAL_CAPSULE | Freq: Every day | ORAL | Status: DC
  Administered 2018-01-24 – 2018-01-25 (×2): 0.4 mg via ORAL
  Filled 2018-01-24 (×2): qty 1

## 2018-01-24 MED ORDER — MEPERIDINE HCL 50 MG/ML IJ SOLN: 6.2500 mg | INTRAMUSCULAR | Status: DC | PRN

## 2018-01-24 MED ORDER — METOPROLOL TARTRATE 5 MG/5ML IV SOLN: 5.0000 mg | Freq: Four times a day (QID) | INTRAVENOUS | Status: DC | PRN

## 2018-01-24 MED ORDER — ONDANSETRON HCL 4 MG/2ML IJ SOLN
INTRAMUSCULAR | Status: DC | PRN
  Administered 2018-01-24: 4 mg via INTRAVENOUS

## 2018-01-24 MED ORDER — ONDANSETRON 4 MG PO TBDP: 4.0000 mg | ORAL_TABLET | Freq: Four times a day (QID) | ORAL | Status: DC | PRN

## 2018-01-24 MED ORDER — LABETALOL HCL 5 MG/ML IV SOLN
INTRAVENOUS | Status: AC
  Administered 2018-01-24: 5 mg via INTRAVENOUS
  Filled 2018-01-24: qty 4

## 2018-01-24 MED ORDER — DIPHENHYDRAMINE HCL 12.5 MG/5ML PO ELIX: 12.5000 mg | ORAL_SOLUTION | Freq: Four times a day (QID) | ORAL | Status: DC | PRN

## 2018-01-24 MED ORDER — PROPOFOL 10 MG/ML IV BOLUS
INTRAVENOUS | Status: DC | PRN
  Administered 2018-01-24: 200 mg via INTRAVENOUS

## 2018-01-24 MED ORDER — IOPAMIDOL (ISOVUE-300) INJECTION 61%
75.0000 mL | Freq: Once | INTRAVENOUS | Status: AC | PRN
  Administered 2018-01-24: 75 mL via INTRAVENOUS

## 2018-01-24 MED ORDER — LABETALOL HCL 5 MG/ML IV SOLN
INTRAVENOUS | Status: DC | PRN
  Administered 2018-01-24: 5 mg via INTRAVENOUS

## 2018-01-24 MED ORDER — SUCCINYLCHOLINE CHLORIDE 20 MG/ML IJ SOLN
INTRAMUSCULAR | Status: DC | PRN
  Administered 2018-01-24: 100 mg via INTRAVENOUS

## 2018-01-24 MED ORDER — MIDAZOLAM HCL 2 MG/2ML IJ SOLN: 0.5000 mg | Freq: Once | INTRAMUSCULAR | Status: DC | PRN

## 2018-01-24 MED ORDER — THROMBIN 5000 UNITS EX SOLR
CUTANEOUS | Status: AC
  Filled 2018-01-24: qty 5000

## 2018-01-24 MED ORDER — EPHEDRINE SULFATE 50 MG/ML IJ SOLN
INTRAMUSCULAR | Status: DC | PRN
  Administered 2018-01-24 (×2): 10 mg via INTRAVENOUS

## 2018-01-24 MED ORDER — LACTATED RINGERS IV SOLN
INTRAVENOUS | Status: DC | PRN
  Administered 2018-01-24: 12:00:00 via INTRAVENOUS

## 2018-01-24 MED ORDER — LABETALOL HCL 100 MG PO TABS
200.0000 mg | ORAL_TABLET | Freq: Three times a day (TID) | ORAL | Status: DC
  Administered 2018-01-24 – 2018-01-25 (×3): 200 mg via ORAL
  Filled 2018-01-24 (×3): qty 2

## 2018-01-24 MED ORDER — AMLODIPINE BESYLATE 10 MG PO TABS
10.0000 mg | ORAL_TABLET | Freq: Every day | ORAL | Status: DC
  Administered 2018-01-24 – 2018-01-25 (×2): 10 mg via ORAL
  Filled 2018-01-24 (×2): qty 1

## 2018-01-24 MED ORDER — LABETALOL HCL 5 MG/ML IV SOLN
5.0000 mg | INTRAVENOUS | Status: AC | PRN
Start: 2018-01-24 — End: 2018-01-24
  Administered 2018-01-24 (×2): 5 mg via INTRAVENOUS

## 2018-01-24 MED ORDER — DIPHENHYDRAMINE HCL 50 MG/ML IJ SOLN
12.5000 mg | Freq: Four times a day (QID) | INTRAMUSCULAR | Status: DC | PRN
Start: 2018-01-24 — End: 2018-01-25

## 2018-01-24 MED ORDER — MIDAZOLAM HCL 2 MG/2ML IJ SOLN
INTRAMUSCULAR | Status: AC
  Filled 2018-01-24: qty 2

## 2018-01-24 MED ORDER — HYDROMORPHONE HCL 1 MG/ML IJ SOLN: 0.5000 mg | INTRAMUSCULAR | Status: DC | PRN

## 2018-01-24 MED ORDER — METFORMIN HCL 500 MG PO TABS
500.0000 mg | ORAL_TABLET | Freq: Two times a day (BID) | ORAL | Status: DC
  Administered 2018-01-24 – 2018-01-25 (×2): 500 mg via ORAL
  Filled 2018-01-24 (×2): qty 1

## 2018-01-24 MED ORDER — CEFAZOLIN SODIUM-DEXTROSE 2-4 GM/100ML-% IV SOLN
2.0000 g | Freq: Three times a day (TID) | INTRAVENOUS | Status: DC
  Administered 2018-01-24 – 2018-01-25 (×3): 2 g via INTRAVENOUS
  Filled 2018-01-24 (×4): qty 100

## 2018-01-24 MED ORDER — "THROMBI-PAD 3""X3"" EX PADS"
MEDICATED_PAD | CUTANEOUS | Status: AC
  Administered 2018-01-24: 08:00:00
  Filled 2018-01-24: qty 1

## 2018-01-24 MED ORDER — TRAZODONE HCL 100 MG PO TABS
100.0000 mg | ORAL_TABLET | Freq: Every day | ORAL | Status: DC
  Administered 2018-01-24: 100 mg via ORAL
  Filled 2018-01-24: qty 1

## 2018-01-24 MED ORDER — DEXAMETHASONE SODIUM PHOSPHATE 10 MG/ML IJ SOLN
INTRAMUSCULAR | Status: DC | PRN
  Administered 2018-01-24: 10 mg via INTRAVENOUS

## 2018-01-24 MED ORDER — LIDOCAINE HCL (CARDIAC) 20 MG/ML IV SOLN
INTRAVENOUS | Status: DC | PRN
  Administered 2018-01-24: 60 mg via INTRAVENOUS

## 2018-01-24 MED ORDER — ONDANSETRON HCL 4 MG/2ML IJ SOLN: 4.0000 mg | Freq: Four times a day (QID) | INTRAMUSCULAR | Status: DC | PRN

## 2018-01-24 MED ORDER — CLONIDINE HCL 0.3 MG/24HR TD PTWK: 0.3000 mg | MEDICATED_PATCH | TRANSDERMAL | Status: DC

## 2018-01-24 MED ORDER — FENTANYL CITRATE (PF) 250 MCG/5ML IJ SOLN
INTRAMUSCULAR | Status: AC
  Filled 2018-01-24: qty 5

## 2018-01-24 MED ORDER — CANAGLIFLOZIN 100 MG PO TABS
100.0000 mg | ORAL_TABLET | Freq: Every day | ORAL | Status: DC
  Administered 2018-01-25: 100 mg via ORAL
  Filled 2018-01-24: qty 1

## 2018-01-24 MED ORDER — PROMETHAZINE HCL 25 MG/ML IJ SOLN
6.2500 mg | INTRAMUSCULAR | Status: DC | PRN
Start: 2018-01-24 — End: 2018-01-24

## 2018-01-24 MED ORDER — FENTANYL CITRATE (PF) 100 MCG/2ML IJ SOLN
INTRAMUSCULAR | Status: DC | PRN
  Administered 2018-01-24: 50 ug via INTRAVENOUS

## 2018-01-24 MED ORDER — METHOCARBAMOL 500 MG PO TABS
500.0000 mg | ORAL_TABLET | Freq: Four times a day (QID) | ORAL | Status: DC | PRN
Start: 2018-01-24 — End: 2018-01-25
  Administered 2018-01-24: 500 mg via ORAL
  Filled 2018-01-24: qty 1

## 2018-01-24 MED ORDER — LABETALOL HCL 5 MG/ML IV SOLN
5.0000 mg | INTRAVENOUS | Status: AC | PRN
  Administered 2018-01-24 (×2): 5 mg via INTRAVENOUS

## 2018-01-24 MED ORDER — SODIUM CHLORIDE 0.9 % IV SOLN
3.0000 g | Freq: Once | INTRAVENOUS | Status: AC
  Administered 2018-01-24: 3 g via INTRAVENOUS
  Filled 2018-01-24: qty 3

## 2018-01-24 MED ORDER — PANTOPRAZOLE SODIUM 40 MG PO TBEC
40.0000 mg | DELAYED_RELEASE_TABLET | Freq: Every day | ORAL | Status: DC
  Administered 2018-01-24: 40 mg via ORAL
  Filled 2018-01-24: qty 1

## 2018-01-24 MED ORDER — 0.9 % SODIUM CHLORIDE (POUR BTL) OPTIME
TOPICAL | Status: DC | PRN
Start: 2018-01-24 — End: 2018-01-24
  Administered 2018-01-24: 1000 mL

## 2018-01-24 MED ORDER — THROMBIN 5000 UNITS EX SOLR
CUTANEOUS | Status: DC | PRN
  Administered 2018-01-24: 5000 [IU] via TOPICAL

## 2018-01-24 MED ORDER — STERILE WATER FOR IRRIGATION IR SOLN
Status: DC | PRN
  Administered 2018-01-24: 1000 mL

## 2018-01-24 MED ORDER — MIDAZOLAM HCL 5 MG/5ML IJ SOLN
INTRAMUSCULAR | Status: DC | PRN
  Administered 2018-01-24: 1 mg via INTRAVENOUS

## 2018-01-24 MED ORDER — OXYCODONE HCL 5 MG PO TABS
5.0000 mg | ORAL_TABLET | ORAL | Status: DC | PRN
  Administered 2018-01-24 – 2018-01-25 (×3): 10 mg via ORAL
  Filled 2018-01-24 (×3): qty 2

## 2018-01-24 MED ORDER — FENTANYL CITRATE (PF) 100 MCG/2ML IJ SOLN
INTRAMUSCULAR | Status: AC
  Filled 2018-01-24: qty 2

## 2018-01-24 MED ORDER — HEMOSTATIC AGENTS (NO CHARGE) OPTIME
TOPICAL | Status: DC | PRN
  Administered 2018-01-24: 1 via TOPICAL

## 2018-01-24 MED ORDER — ACETAMINOPHEN 500 MG PO TABS
1000.0000 mg | ORAL_TABLET | Freq: Four times a day (QID) | ORAL | Status: DC
  Administered 2018-01-24 – 2018-01-25 (×2): 1000 mg via ORAL
  Filled 2018-01-24 (×2): qty 2

## 2018-01-24 MED ORDER — LORAZEPAM 1 MG PO TABS
1.0000 mg | ORAL_TABLET | Freq: Every day | ORAL | Status: DC
  Administered 2018-01-24: 1 mg via ORAL
  Filled 2018-01-24: qty 1

## 2018-01-24 MED ORDER — FENTANYL CITRATE (PF) 100 MCG/2ML IJ SOLN
50.0000 ug | Freq: Once | INTRAMUSCULAR | Status: AC
  Administered 2018-01-24: 50 ug via INTRAVENOUS

## 2018-01-24 MED ORDER — LIDOCAINE-EPINEPHRINE 2 %-1:100000 IJ SOLN
INTRAMUSCULAR | Status: AC
  Filled 2018-01-24: qty 1

## 2018-01-24 MED ORDER — FENTANYL CITRATE (PF) 100 MCG/2ML IJ SOLN
50.0000 ug | Freq: Once | INTRAMUSCULAR | Status: AC
  Administered 2018-01-24: 50 ug via INTRAVENOUS
  Filled 2018-01-24: qty 2

## 2018-01-24 MED ORDER — FENTANYL CITRATE (PF) 100 MCG/2ML IJ SOLN: 25.0000 ug | INTRAMUSCULAR | Status: DC | PRN

## 2018-01-24 MED ORDER — LIDOCAINE-EPINEPHRINE 2 %-1:100000 IJ SOLN
INTRAMUSCULAR | Status: DC | PRN
  Administered 2018-01-24: 10 mL

## 2018-01-24 MED ORDER — TRANEXAMIC ACID 1000 MG/10ML IV SOLN
INTRAVENOUS | Status: AC
  Filled 2018-01-24: qty 10

## 2018-01-24 MED ORDER — INSULIN ASPART 100 UNIT/ML ~~LOC~~ SOLN
0.0000 [IU] | Freq: Three times a day (TID) | SUBCUTANEOUS | Status: DC
  Administered 2018-01-24: 4 [IU] via SUBCUTANEOUS

## 2018-01-24 MED ORDER — ATORVASTATIN CALCIUM 40 MG PO TABS
40.0000 mg | ORAL_TABLET | Freq: Every day | ORAL | Status: DC
  Administered 2018-01-24: 40 mg via ORAL
  Filled 2018-01-24: qty 1

## 2018-01-24 MED ORDER — DEXTROSE-NACL 5-0.45 % IV SOLN
INTRAVENOUS | Status: DC
  Administered 2018-01-24: 16:00:00 via INTRAVENOUS

## 2018-01-24 MED ORDER — CHLORTHALIDONE 50 MG PO TABS
50.0000 mg | ORAL_TABLET | Freq: Every day | ORAL | Status: DC
  Administered 2018-01-25: 50 mg via ORAL
  Filled 2018-01-24: qty 1

## 2018-01-24 MED ORDER — PHENYLEPHRINE HCL 10 MG/ML IJ SOLN
INTRAMUSCULAR | Status: DC | PRN
  Administered 2018-01-24: 120 ug via INTRAVENOUS
  Administered 2018-01-24: 200 ug via INTRAVENOUS
  Administered 2018-01-24: 320 ug via INTRAVENOUS
  Administered 2018-01-24: 80 ug via INTRAVENOUS
  Administered 2018-01-24: 280 ug via INTRAVENOUS

## 2018-01-24 SURGICAL SUPPLY — 37 items
BLADE SURG 15 STRL LF DISP TIS (BLADE) ×1 IMPLANT
BLADE SURG 15 STRL SS (BLADE) ×3
BUR CROSS CUT FISSURE 1.6 (BURR) ×1 IMPLANT
BUR CROSS CUT FISSURE 1.6MM (BURR)
CANISTER SUCT 3000ML PPV (MISCELLANEOUS) ×3 IMPLANT
COVER SURGICAL LIGHT HANDLE (MISCELLANEOUS) ×3 IMPLANT
DECANTER SPIKE VIAL GLASS SM (MISCELLANEOUS) ×2 IMPLANT
ELECT NDL BLADE 2-5/6 (NEEDLE) IMPLANT
ELECT NEEDLE BLADE 2-5/6 (NEEDLE) ×3 IMPLANT
GAUZE PACKING FOLDED 2  STR (GAUZE/BANDAGES/DRESSINGS) ×2
GAUZE PACKING FOLDED 2 STR (GAUZE/BANDAGES/DRESSINGS) ×1 IMPLANT
GAUZE SPONGE 4X4 16PLY XRAY LF (GAUZE/BANDAGES/DRESSINGS) IMPLANT
GLOVE BIO SURGEON STRL SZ 6.5 (GLOVE) ×2 IMPLANT
GLOVE BIO SURGEON STRL SZ7.5 (GLOVE) ×3 IMPLANT
GLOVE BIO SURGEONS STRL SZ 6.5 (GLOVE) ×1
GLOVE BIOGEL PI IND STRL 7.0 (GLOVE) ×1 IMPLANT
GLOVE BIOGEL PI INDICATOR 7.0 (GLOVE) ×2
GOWN STRL REUS W/ TWL LRG LVL3 (GOWN DISPOSABLE) ×1 IMPLANT
GOWN STRL REUS W/ TWL XL LVL3 (GOWN DISPOSABLE) ×1 IMPLANT
GOWN STRL REUS W/TWL LRG LVL3 (GOWN DISPOSABLE) ×3
GOWN STRL REUS W/TWL XL LVL3 (GOWN DISPOSABLE) ×3
KIT BASIN OR (CUSTOM PROCEDURE TRAY) ×3 IMPLANT
KIT ROOM TURNOVER OR (KITS) ×3 IMPLANT
NEEDLE 22X1 1/2 (OR ONLY) (NEEDLE) ×3 IMPLANT
NS IRRIG 1000ML POUR BTL (IV SOLUTION) ×3 IMPLANT
PAD ARMBOARD 7.5X6 YLW CONV (MISCELLANEOUS) ×6 IMPLANT
PENCIL BUTTON HOLSTER BLD 10FT (ELECTRODE) ×3 IMPLANT
SPONGE SURGIFOAM ABS GEL 100 (HEMOSTASIS) ×2 IMPLANT
SUCTION FRAZIER HANDLE 10FR (MISCELLANEOUS) ×2
SUCTION TUBE FRAZIER 10FR DISP (MISCELLANEOUS) IMPLANT
SUT CHROMIC 3 0 PS 2 (SUTURE) ×6 IMPLANT
SUT VIC AB 3-0 FS2 27 (SUTURE) ×2 IMPLANT
SYR CONTROL 10ML LL (SYRINGE) ×3 IMPLANT
TOWEL OR 17X26 10 PK STRL BLUE (TOWEL DISPOSABLE) ×3 IMPLANT
TRAY ENT MC OR (CUSTOM PROCEDURE TRAY) ×3 IMPLANT
TUBING IRRIGATION (MISCELLANEOUS) IMPLANT
YANKAUER SUCT BULB TIP NO VENT (SUCTIONS) ×3 IMPLANT

## 2018-01-24 NOTE — Anesthesia Preprocedure Evaluation (Addendum)
Anesthesia Evaluation  Patient identified by MRN, date of birth, ID band Patient awake    Reviewed: Allergy & Precautions, NPO status , [name] Chart, lab work & pertinent test results  History of Anesthesia Complications Negative for: history of anesthetic complications  Airway Mallampati: II  TM Distance: >3 FB Neck ROM: Full    Dental  (+) Dental Advisory Given   Pulmonary sleep apnea (does not uase his CPAP) , Current Smoker,    breath sounds clear to auscultation       Cardiovascular hypertension, Pt. on medications (-) angina Rhythm:Regular Rate:Normal  '16 ECHO: EF 55-60%, valves OK   Neuro/Psych Depression CVA (L weakness), Residual Symptoms    GI/Hepatic Neg liver ROS, GERD  Poorly Controlled,  Endo/Other  diabetes (glu 158), Oral Hypoglycemic AgentsMorbid obesity  Renal/GU Renal InsufficiencyRenal disease (creat 1.29)     Musculoskeletal  (+) Arthritis ,   Abdominal (+) + obese,   Peds  Hematology   Anesthesia Other Findings   Reproductive/Obstetrics                            Anesthesia Physical Anesthesia Plan  ASA: III and emergent  Anesthesia Plan: General   Post-op Pain Management:    Induction: Rapid sequence and Intravenous  PONV Risk Score and Plan: 1 and Ondansetron  Airway Management Planned: Oral ETT and Video Laryngoscope Planned  Additional Equipment:   Intra-op Plan:   Post-operative Plan: Extubation in OR  Informed Consent: I have reviewed the patients History and Physical, chart, labs and discussed the procedure including the risks, benefits and alternatives for the proposed anesthesia with the patient or authorized representative who has indicated his/her understanding and acceptance.   Dental advisory given  Plan Discussed with: CRNA and Surgeon  Anesthesia Plan Comments: (Plan routine monitors, GETA with VideoGlide intubation)         Anesthesia Quick Evaluation

## 2018-01-24 NOTE — H&P (Signed)
HISTORY AND PHYSICAL  Taylor Obrien is a 52 y.o. male patient with CC: oral bleeding  HPI: Patient underwent full mouth extractions with alveoloplasty yesterday. Discharged home through short stay. Normal intra operative bleeding. Minimal bleeding post op in recovery room. Presented to [facility] ER 2am this morning with oral bleeding. Unable to stop in ER with pressure, topical thrombin, oral rinses with TXA. Transferred to [facility] ER for definitive treatment.  1. Gingival bleeding     Past Medical History:  Diagnosis Date  . Arthritis   . Chronic pain   . Depression   . Diabetes mellitus without complication ([place])   . Essential hypertension, benign   . GERD (gastroesophageal reflux disease)   . Headache   . HTN (hypertension) 11/27/2015  . Hyperlipidemia   . Sleep apnea    cpap   . Stroke [facility])    2016    Current Facility-Administered Medications  Medication Dose Route Frequency Provider Last Rate Last Dose  . fentaNYL (SUBLIMAZE) 100 MCG/2ML injection            Current Outpatient Medications  Medication Sig Dispense Refill  . acetaminophen (TYLENOL) 500 MG tablet Take 2 tablets (1,000 mg total) by mouth every 8 (eight) hours as needed. (Patient taking differently: Take 500 mg by mouth every 8 (eight) hours as needed for mild pain. ) 30 tablet 0  . amLODipine (NORVASC) 10 MG tablet Take 1 tablet (10 mg total) by mouth daily. 30 tablet 0  . amoxicillin (AMOXIL) 500 MG capsule Take 1 capsule (500 mg total) by mouth 3 (three) times daily. 21 capsule 0  . aspirin 325 MG tablet Take 1 tablet (325 mg total) by mouth daily. 100 tablet 0  . atorvastatin (LIPITOR) 40 MG tablet Take 1 tablet (40 mg total) by mouth daily at 6 PM. (Patient taking differently: Take 80 mg by mouth daily at 6 PM. ) 30 tablet 0  . blood glucose meter kit and supplies KIT Dispense based on patient and insurance preference. Use up to four times daily as directed. (FOR ICD-9 250.00, 250.01). 1 each 0  .  chlorthalidone (HYGROTON) 50 MG tablet Take 1 tablet (50 mg total) by mouth daily. (Patient taking differently: Take 50 mg by mouth every evening. ) 30 tablet 0  . cloNIDine (CATAPRES - DOSED IN MG/24 HR) 0.3 mg/24hr patch Place 1 patch (0.3 mg total) onto the skin once a week. Next change on monday 4 patch 12  . diphenhydramine-acetaminophen (TYLENOL PM) 25-500 MG TABS tablet Take 1-2 tablets by mouth at bedtime as needed (for sleep). Depends on insomnia if takes 1-2 tablets    . empagliflozin (JARDIANCE) 10 MG TABS tablet Take 10 mg by mouth daily.    Marland Kitchen escitalopram (LEXAPRO) 10 MG tablet Take 10 mg by mouth daily.    . folic acid (FOLVITE) 1 MG tablet Take 1 tablet (1 mg total) by mouth daily. 30 tablet 0  . gabapentin (NEURONTIN) 100 MG capsule Take 1 capsule (100 mg total) by mouth 3 (three) times daily. (Patient not taking: Reported on 01/21/2018) 21 capsule 0  . glucose blood test strip Use as instructed 100 each 12  . hydrocerin (EUCERIN) CREA Apply 1 application topically 3 (three) times daily. Available over the counter (Patient not taking: Reported on 01/21/2018)  0  . labetalol (NORMODYNE) 200 MG tablet Take 1 tablet (200 mg total) by mouth 3 (three) times daily. 90 tablet 0  . LORazepam (ATIVAN) 1 MG tablet Take 1 mg  by mouth at bedtime.    . Magnesium Hydroxide (MAGNESIA PO) Take 1 tablet by mouth daily.    . metFORMIN (GLUCOPHAGE) 500 MG tablet Take 1 tablet (500 mg total) by mouth 2 (two) times daily with a meal. 60 tablet 3  . methocarbamol (ROBAXIN) 500 MG tablet Take 2 tablets (1,000 mg total) by mouth 4 (four) times daily as needed for muscle spasms (muscle spasm/pain). (Patient not taking: Reported on 03/12/2017) 25 tablet 0  . Multiple Vitamin (MULTIVITAMIN WITH MINERALS) TABS tablet Take 1 tablet by mouth daily. 100 tablet 1  . oxyCODONE-acetaminophen (PERCOCET) 10-325 MG tablet Take 1 tablet by mouth every 6 (six) hours as needed for pain. 40 tablet 0  . pantoprazole (PROTONIX) 40  MG tablet Take 1 tablet (40 mg total) by mouth at bedtime. 30 tablet 0  . potassium chloride SA (K-DUR,KLOR-CON) 20 MEQ tablet Take 1 tablet (20 mEq total) by mouth 2 (two) times daily. (Patient not taking: Reported on 01/21/2018) 60 tablet 0  . tamsulosin (FLOMAX) 0.4 MG CAPS capsule Take 0.4 mg by mouth daily.    Marland Kitchen tiZANidine (ZANAFLEX) 4 MG tablet TAKE (2) TABLETS BY MOUTH AT BEDTIME. 60 tablet 0  . traZODone (DESYREL) 100 MG tablet Take 1 tablet (100 mg total) by mouth at bedtime. 30 tablet 1   Allergies  Allergen Reactions  . Oxcarbazepine Other (See Comments)    Patient goes out of right state of mind.    Active Problems:   * No active hospital problems. *  Vitals: Blood pressure (!) 165/122, pulse (!) 111, temperature 99.2 F (37.3 C), temperature source Oral, resp. rate 16, height _0  (1.651 m), weight 209 lb (94.8 kg), SpO2 91 %. Lab results: Results for orders placed or performed during the hospital encounter of 01/24/18 (from the past 24 hour(s))  Protime-INR     Status: None   Collection Time: 01/24/18  3:00 AM  Result Value Ref Range   Prothrombin Time 12.2 11.4 - 15.2 seconds   INR 4.31   Basic metabolic panel     Status: Abnormal   Collection Time: 01/24/18  3:00 AM  Result Value Ref Range   Sodium 137 135 - 145 mmol/L   Potassium 3.7 3.5 - 5.1 mmol/L   Chloride 100 (L) 101 - 111 mmol/L   CO2 25 22 - 32 mmol/L   Glucose, Bld 158 (H) 65 - 99 mg/dL   BUN 15 6 - 20 mg/dL   Creatinine, Ser 1.29 (H) 0.61 - 1.24 mg/dL   Calcium 9.2 8.9 - 10.3 mg/dL   GFR calc non Af Amer >60 >60 mL/min   GFR calc Af Amer >60 >60 mL/min   Anion gap 12 5 - 15  CBC with Differential/Platelet     Status: Abnormal   Collection Time: 01/24/18  3:00 AM  Result Value Ref Range   WBC 10.8 (H) 4.0 - 10.5 K/uL   RBC 4.99 4.22 - 5.81 MIL/uL   Hemoglobin 15.4 13.0 - 17.0 g/dL   HCT 47.1 39.0 - 52.0 %   MCV 94.4 78.0 - 100.0 fL   MCH 30.9 26.0 - 34.0 pg   MCHC 32.7 30.0 - 36.0 g/dL   RDW  14.5 11.5 - 15.5 %   Platelets 199 150 - 400 K/uL   Neutrophils Relative % 75 %   Neutro Abs 8.2 (H) 1.7 - 7.7 K/uL   Lymphocytes Relative 13 %   Lymphs Abs 1.4 0.7 - 4.0 K/uL   Monocytes Relative  11 %   Monocytes Absolute 1.2 (H) 0.1 - 1.0 K/uL   Eosinophils Relative 1 %   Eosinophils Absolute 0.1 0.0 - 0.7 K/uL   Basophils Relative 0 %   Basophils Absolute 0.0 0.0 - 0.1 K/uL   Radiology Results: Ct Maxillofacial W Contrast  Result Date: 01/24/2018 CLINICAL DATA:  Sharp constant bilateral face pain. Thirty teeth extracted today. Swelling and bleeding. History of stroke. EXAM: CT MAXILLOFACIAL WITH CONTRAST TECHNIQUE: Multidetector CT imaging of the maxillofacial structures was performed with intravenous contrast. Multiplanar CT image reconstructions were also generated. CONTRAST:  72m ISOVUE-300 IOPAMIDOL (ISOVUE-300) INJECTION 61% COMPARISON:  MRI of the head October 20, 2015 FINDINGS: OSSEOUS: The mandible is intact, the condyles are located. No destructive bony lesions. Status post recent extraction of all teeth with postprocedural fragmentation alveolar ridge. No acute facial fracture. ORBITS: Ocular globes and orbital contents are normal. SINUSES: Mild paranasal sinus mucosal thickening without air-fluid levels. Mastoid air cells are well aerated. Nasal septum is midline. Included mastoid aircells are well aerated. SOFT TISSUES: Severe mid lower face/perioral soft tissue swelling, subcutaneous fat stranding. Soft tissue swelling extends to RIGHT periorbital soft tissues. No focal fluid collection, focal hematoma, radiopaque foreign bodies or subcutaneous gas. No tongue swelling. Patent airway. Mild calcific atherosclerosis carotid bifurcations. LIMITED INTRACRANIAL: Old large RIGHT ACA territory infarct. IMPRESSION: 1. Severe facial soft tissue swelling attributable to postprocedural changes, cellulitis not excluded. No abscess. Status post recent extraction of all teeth. 2. Patent airway.  Electronically Signed   By: CElon AlasM.D.   On: 01/24/2018 04:20   General appearance: alert, cooperative and mild distress Head: Normocephalic, without obvious abnormality, atraumatic Eyes: conjunctivae/corneas clear. PERRL, EOM's intact. Fundi benign. Nose: Nares normal. Septum midline. Mucosa normal. No drainage or sinus tenderness. Throat: Liver clot right posterior maxilla a tooth # 1 area. Mild hemmorhage from this area. Sutures intact. No other active bleeding sites noted. Pharynx clear.  Neck: no adenopathy, supple, symmetrical, trachea midline and thyroid not enlarged, symmetric, no tenderness/mass/nodules  Assessment:Post-op oral bleeding s/p full mouth extractions 01/23/2018  Plan: EUA with re-suture oral incision site. GHardesty2/16/2019

## 2018-01-24 NOTE — Anesthesia Postprocedure Evaluation (Signed)
Anesthesia Post Note  Patient: Taylor Obrien  Procedure(s) Performed: SUTURE ORAL WOUND (N/A Mouth)     Patient location during evaluation: PACU Anesthesia Type: General Level of consciousness: awake and alert, patient cooperative and oriented Pain management: pain level controlled Vital Signs Assessment: post-procedure vital signs reviewed and stable Respiratory status: spontaneous breathing, nonlabored ventilation and respiratory function stable Cardiovascular status: blood pressure returned to baseline and stable Postop Assessment: no apparent nausea or vomiting Anesthetic complications: no    Last Vitals:  Vitals:   01/24/18 1430 01/24/18 1515  BP: (!) 162/110 (!) 156/93  Pulse: 97 98  Resp: 16 19  Temp:    SpO2: 95%     Last Pain:  Vitals:   01/24/18 1415  TempSrc:   PainSc: 0-No pain                 [name],E. Robbert Langlinais

## 2018-01-24 NOTE — ED Provider Notes (Signed)
PROGRESS NOTE                                                                                                                 This is a transfer from Dr. Christy Gentles for Dr. Hoyt Koch of oral surgery.   Taylor Obrien is a 52 y.o. male that had 28 teeth pulled yesterday, he is not anticoagulated, he is very difficult to control bleeding despite swish and spit with TXA, direct pressure and thrombin pad.  On my exam patient continues to bleed profusely.  Please refer to previous note for full HPI, ROS, PMH and PE.   Dr. Hoyt Koch to admit for definitive treatment.    Waynetta Pean 01/24/18 1243    Valarie Merino, MD 01/25/18 2142

## 2018-01-24 NOTE — Op Note (Signed)
01/24/2018  1:13 PM  PATIENT:  Taylor Obrien  52 y.o. male  PRE-OPERATIVE DIAGNOSIS:  Post operative Oral bleeding from incision  POST-OPERATIVE DIAGNOSIS:  SAME  PROCEDURE:  Procedure(s): SUTURE ORAL WOUND  SURGEON:  Surgeon(s): Diona Browner, DDS  ANESTHESIA:   local and general  EBL:  minimal  DRAINS: none   SPECIMEN:  No Specimen  COUNTS:  YES  PLAN OF CARE: Discharge to home after PACU  PATIENT DISPOSITION:  PACU - hemodynamically stable.   PROCEDURE DETAILS: Dictation # [id number]  Gae Bon, DMD 01/24/2018 1:13 PM

## 2018-01-24 NOTE — ED Triage Notes (Signed)
Pt states he had about 30 teeth removed today. Pt states he noticed increased swelling and bleeding to his mouth once he arrived home.

## 2018-01-24 NOTE — ED Notes (Signed)
Placed call to Dr Lupita Leash answering service, they are suppose to return call.

## 2018-01-24 NOTE — ED Notes (Signed)
Report called to OR  

## 2018-01-24 NOTE — ED Triage Notes (Signed)
Patient transferred to [facility] ED from [facility] by [place]. Carelink reports:  Patient had 28 teeth pulled yesterday. Had only held ASA 324 yesterday (one day). Bleeding bright red blood from upper right side of mouth. Patient sent to see Oral Surgeon. [facility] administered Fentanyl x 3. Pain 8/10 decreased to 5/10. 20 gauge in L forearm.  Patient hypertensive. Has not taken BP medication today. BP 170/112, Pulse 82.

## 2018-01-24 NOTE — ED Notes (Signed)
OR called and ready for pt. Horris Latino RN to call report.

## 2018-01-24 NOTE — ED Provider Notes (Signed)
[facility] EMERGENCY DEPARTMENT Provider Note   CSN: [id number] Arrival date & time: 01/24/18  0246     History   Chief Complaint Chief Complaint  Patient presents with  . Facial Swelling   Level 5 caveat due to acuity of condition.   HPI Taylor Obrien is a 52 y.o. male.  The history is provided by the patient.  Dental Pain   This is a new problem. The current episode started yesterday. The problem occurs constantly. The problem has been rapidly worsening. The pain is severe. He has tried nothing for the symptoms.  Patient presents with dental pain/swelling/bleeding.  He had multiple dental extractions done yesterday.  He began having bleeding in his mouth and swelling in his mouth.  Due to acuity of condition no other details known at this time  Past Medical History:  Diagnosis Date  . Arthritis   . Chronic pain   . Depression   . Diabetes mellitus without complication ([place])   . Essential hypertension, benign   . GERD (gastroesophageal reflux disease)   . Headache   . HTN (hypertension) 11/27/2015  . Hyperlipidemia   . Sleep apnea    cpap   . Stroke [facility])    2016    Patient Active Problem List   Diagnosis Date Noted  . Spastic hemiplegia affecting nondominant side ([place]) 06/03/2016  . Dysuria   . OSA (obstructive sleep apnea)   . Hypokalemia   . Elevated blood pressure   . Hemiparesis affecting left side as late effect of stroke ([place])   . Epistaxis   . HTN (hypertension) 11/27/2015  . AKI (acute kidney injury) ([place]) 11/27/2015  . Hemiplegia and hemiparesis following unspecified cerebrovascular disease affecting left non-dominant side ([place]) 11/23/2015  . Gait disturbance, post-stroke 11/23/2015  . Stroke due to occlusion of right anterior cerebral artery ([place]) 11/23/2015  . Cerebrovascular accident (CVA) due to occlusion of right anterior cerebral artery ([place])   . Essential hypertension   . Depression   . Chronic pain syndrome   . ETOH abuse   . Marijuana  abuse   . Cerebrovascular accident (CVA) due to thrombosis of right carotid artery ([place])   . Malignant hypertension   . Hyperlipidemia   . CVA (cerebral infarction) 11/18/2015  . Stroke (cerebrum) ([place]) 11/18/2015  . Chest pain 10/09/2012  . GERD (gastroesophageal reflux disease) 10/09/2012  . Chronic back pain 10/09/2012  . Tobacco abuse 10/09/2012  . Hypertensive heart disease 08/31/2012  . Accelerated hypertension 08/31/2012  . Precordial pain 08/31/2012    Past Surgical History:  Procedure Laterality Date  . CLOSED REDUCTION MANDIBULAR FRACTURE W/ ARCH BARS     + multiple extractions  . Condyloma resection    . RADIOLOGY WITH ANESTHESIA N/A 11/18/2015   Procedure: RADIOLOGY WITH ANESTHESIA;  Surgeon: Luanne Bras, MD;  Location: [place];  Service: Radiology;  Laterality: N/A;  . Removal foreign body right shoulder    . Right rotator cuff repair    . TEE WITHOUT CARDIOVERSION N/A 11/21/2015   Procedure: TRANSESOPHAGEAL ECHOCARDIOGRAM (TEE);  Surgeon: Larey Dresser, MD;  Location: [facility];  Service: Cardiovascular;  Laterality: N/A;       Home Medications    Prior to Admission medications   Medication Sig Start Date End Date Taking? Authorizing Provider  acetaminophen (TYLENOL) 500 MG tablet Take 2 tablets (1,000 mg total) by mouth every 8 (eight) hours as needed. Patient taking differently: Take 500 mg by mouth every 8 (eight) hours as needed for  mild pain.  12/20/15   Love, Ivan Anchors, PA-C  amLODipine (NORVASC) 10 MG tablet Take 1 tablet (10 mg total) by mouth daily. 01/19/16   Kirsteins, [name], MD  amoxicillin (AMOXIL) 500 MG capsule Take 1 capsule (500 mg total) by mouth 3 (three) times daily. 01/23/18   Diona Browner, DDS  aspirin 325 MG tablet Take 1 tablet (325 mg total) by mouth daily. 12/20/15   Love, Ivan Anchors, PA-C  atorvastatin (LIPITOR) 40 MG tablet Take 1 tablet (40 mg total) by mouth daily at 6 PM. Patient taking differently: Take 80 mg by mouth daily  at 6 PM.  01/19/16   Kirsteins, [name], MD  blood glucose meter kit and supplies KIT Dispense based on patient and insurance preference. Use up to four times daily as directed. (FOR ICD-9 250.00, 250.01). 03/18/17   Forde Dandy, MD  chlorthalidone (HYGROTON) 50 MG tablet Take 1 tablet (50 mg total) by mouth daily. Patient taking differently: Take 50 mg by mouth every evening.  01/19/16   Kirsteins, [name], MD  cloNIDine (CATAPRES - DOSED IN MG/24 HR) 0.3 mg/24hr patch Place 1 patch (0.3 mg total) onto the skin once a week. Next change on monday 12/20/15   Love, Ivan Anchors, PA-C  diphenhydramine-acetaminophen (TYLENOL PM) 25-500 MG TABS tablet Take 1-2 tablets by mouth at bedtime as needed (for sleep). Depends on insomnia if takes 1-2 tablets    [provider]  empagliflozin (JARDIANCE) 10 MG TABS tablet Take 10 mg by mouth daily.    [provider]  escitalopram (LEXAPRO) 10 MG tablet Take 10 mg by mouth daily.    [provider]  folic acid (FOLVITE) 1 MG tablet Take 1 tablet (1 mg total) by mouth daily. 12/20/15   Love, Ivan Anchors, PA-C  gabapentin (NEURONTIN) 100 MG capsule Take 1 capsule (100 mg total) by mouth 3 (three) times daily. Patient not taking: Reported on 01/21/2018 01/17/18   Carmin Muskrat, MD  glucose blood test strip Use as instructed 03/18/17   Forde Dandy, MD  hydrocerin (EUCERIN) CREA Apply 1 application topically 3 (three) times daily. Available over the counter Patient not taking: Reported on 01/21/2018 12/20/15   Love, Ivan Anchors, PA-C  labetalol (NORMODYNE) 200 MG tablet Take 1 tablet (200 mg total) by mouth 3 (three) times daily. 01/19/16   Kirsteins, [name], MD  LORazepam (ATIVAN) 1 MG tablet Take 1 mg by mouth at bedtime.    [provider]  Magnesium Hydroxide (MAGNESIA PO) Take 1 tablet by mouth daily.    [provider]  metFORMIN (GLUCOPHAGE) 500 MG tablet Take 1 tablet (500 mg total) by mouth 2 (two) times daily with a meal.  03/18/17   Forde Dandy, MD  methocarbamol (ROBAXIN) 500 MG tablet Take 2 tablets (1,000 mg total) by mouth 4 (four) times daily as needed for muscle spasms (muscle spasm/pain). Patient not taking: Reported on 03/12/2017 01/17/17   Francine Graven, DO  Multiple Vitamin (MULTIVITAMIN WITH MINERALS) TABS tablet Take 1 tablet by mouth daily. 12/20/15   Love, Ivan Anchors, PA-C  oxyCODONE-acetaminophen (PERCOCET) 10-325 MG tablet Take 1 tablet by mouth every 6 (six) hours as needed for pain. 01/23/18   Diona Browner, DDS  pantoprazole (PROTONIX) 40 MG tablet Take 1 tablet (40 mg total) by mouth at bedtime. 01/19/16   Kirsteins, [name], MD  potassium chloride SA (K-DUR,KLOR-CON) 20 MEQ tablet Take 1 tablet (20 mEq total) by mouth 2 (two) times daily. Patient  not taking: Reported on 01/21/2018 01/19/16   Charlett Blake, MD  tamsulosin (FLOMAX) 0.4 MG CAPS capsule Take 0.4 mg by mouth daily.    [provider]  tiZANidine (ZANAFLEX) 4 MG tablet TAKE (2) TABLETS BY MOUTH AT BEDTIME. 06/13/16   Kirsteins, [name], MD  traZODone (DESYREL) 100 MG tablet Take 1 tablet (100 mg total) by mouth at bedtime. 01/19/16   Kirsteins, [name], MD    Family History Family History  Problem Relation Age of Onset  . Diabetes Mother   . Hypertension Mother   . Drug abuse Mother   . Hyperlipidemia Mother   . Diabetes Father   . Hypertension Father   . Hyperlipidemia Father   . Diabetes Brother   . Hypertension Brother     Social History Social History   Tobacco Use  . Smoking status: Current Every Day Smoker    Packs/day: 0.50    Years: 30.00    Pack years: 15.00    Types: Cigarettes    Last attempt to quit: 11/17/2015    Years since quitting: 2.1  . Smokeless tobacco: Never Used  Substance Use Topics  . Alcohol use: No    Alcohol/week: 0.0 oz  . Drug use: Yes    Types: Marijuana     Allergies   Oxcarbazepine   Review of Systems Review of Systems  Unable to perform ROS: Acuity of  condition     Physical Exam Updated Vital Signs BP (!) 172/120   Pulse (!) 105   Temp 99.2 F (37.3 C) (Tympanic)   Resp 19   Ht 1.651 m (5' 5" )   Wt 94.8 kg (209 lb)   SpO2 92%   BMI 34.78 kg/m   Physical Exam CONSTITUTIONAL: Well developed/well nourished, anxious HEAD: Normocephalic/atraumatic EYES: EOMI/PERRL ENMT: Mucous membranes moist soft tissue swelling noted to lower lip.  The patient is edentulous with evidence of recent multiple dental extractions  there is a large amount of blood in his mouth. Most of the blood is coming from the extraction site of right upper molar (#1).  No tongue swelling, no drooling, floor of mouth soft, I am able to visualize posterior oropharynx.  He does have significant soft tissue edema tenderness to mandibular gingiva NECK: supple no meningeal signs SPINE/BACK:entire spine nontender CV: S1/S2 noted, no murmurs/rubs/gallops noted LUNGS: Lungs are clear to auscultation bilaterally, no apparent distress ABDOMEN: soft GU:no cva tenderness NEURO: Pt is awake/alert/appropriate, moves all extremitiesx4.  No facial droop.   EXTREMITIES:  full ROM SKIN: warm, color normal PSYCH: anxious  ED Treatments / Results  Labs (all labs ordered are listed, but only abnormal results are displayed) Labs Reviewed  BASIC METABOLIC PANEL - Abnormal; Notable for the following components:      Result Value   Chloride 100 (*)    Glucose, Bld 158 (*)    Creatinine, Ser 1.29 (*)    All other components within normal limits  CBC WITH DIFFERENTIAL/PLATELET - Abnormal; Notable for the following components:   WBC 10.8 (*)    Neutro Abs 8.2 (*)    Monocytes Absolute 1.2 (*)    All other components within normal limits  PROTIME-INR    EKG  EKG Interpretation None       Radiology Ct Maxillofacial W Contrast  Result Date: 01/24/2018 CLINICAL DATA:  Sharp constant bilateral face pain. Thirty teeth extracted today. Swelling and bleeding. History of  stroke. EXAM: CT MAXILLOFACIAL WITH CONTRAST TECHNIQUE: Multidetector CT imaging of the maxillofacial  structures was performed with intravenous contrast. Multiplanar CT image reconstructions were also generated. CONTRAST:  40m ISOVUE-300 IOPAMIDOL (ISOVUE-300) INJECTION 61% COMPARISON:  MRI of the head October 20, 2015 FINDINGS: OSSEOUS: The mandible is intact, the condyles are located. No destructive bony lesions. Status post recent extraction of all teeth with postprocedural fragmentation alveolar ridge. No acute facial fracture. ORBITS: Ocular globes and orbital contents are normal. SINUSES: Mild paranasal sinus mucosal thickening without air-fluid levels. Mastoid air cells are well aerated. Nasal septum is midline. Included mastoid aircells are well aerated. SOFT TISSUES: Severe mid lower face/perioral soft tissue swelling, subcutaneous fat stranding. Soft tissue swelling extends to RIGHT periorbital soft tissues. No focal fluid collection, focal hematoma, radiopaque foreign bodies or subcutaneous gas. No tongue swelling. Patent airway. Mild calcific atherosclerosis carotid bifurcations. LIMITED INTRACRANIAL: Old large RIGHT ACA territory infarct. IMPRESSION: 1. Severe facial soft tissue swelling attributable to postprocedural changes, cellulitis not excluded. No abscess. Status post recent extraction of all teeth. 2. Patent airway. Electronically Signed   By: CElon AlasM.D.   On: 01/24/2018 04:20    Procedures Procedures   CRITICAL CARE Performed by: DSharyon CableTotal critical care time: 33 minutes Critical care time was exclusive of separately billable procedures and treating other patients. Critical care was necessary to treat or prevent imminent or life-threatening deterioration. Critical care was time spent personally by me on the following activities: development of treatment plan with patient and/or surrogate as well as nursing, discussions with consultants, evaluation of  patient's response to treatment, examination of patient, obtaining history from patient or surrogate, ordering and performing treatments and interventions, ordering and review of laboratory studies, ordering and review of radiographic studies, pulse oximetry and re-evaluation of patient's condition. Patient required multiple re-evaluations, multiple medications to control bleeding.  Discussed with consultants and required transfer  Medications Ordered in ED Medications  THROMBI-PAD 3"X3" pad (not administered)  Ampicillin-Sulbactam (UNASYN) 3 g in sodium chloride 0.9 % 100 mL IVPB (0 g Intravenous Stopped 01/24/18 0435)  fentaNYL (SUBLIMAZE) injection 50 mcg (50 mcg Intravenous Given 01/24/18 0342)  iopamidol (ISOVUE-300) 61 % injection 75 mL (75 mLs Intravenous Contrast Given 01/24/18 0357)  tranexamic acid 5% oral solution for E.D. (10 mLs Mouth/Throat Given 01/24/18 0521)  fentaNYL (SUBLIMAZE) injection 50 mcg (50 mcg Intravenous Given 01/24/18 05056     Initial Impression / Assessment and Plan / ED Course  I have reviewed the triage vital signs and the nursing notes.  Pertinent labs  results that were available during my care of the patient were reviewed by me and considered in my medical decision making (see chart for details).     3:32 AM Patient just had multiple dental extractions (28) done on February 15.  He now has bleeding, that appears to be improving with pressure.  I am concerned about significant soft tissue gingival edema.  Plan to get CT face. IV antibiotics and IV pain medicines been ordered 5:24 AM CT does not reveal any acute abscess, and has patent airway Still oozing blood from extraction site.  We will have him swish and spit TXA 7:05 AM Despite efforts with TXA as well as direct pressure, patient continues to bleed.  I have consulted his oral surgeon 7:14 AM Discussed case with Dr. JHoyt Koch his oral surgeon. He requests that if patient continues to bleed even if we  try other options like topical thrombin, [facility]09 AM Unable to control bleeding with thrombin, he is still  oozing blood from extraction site  D/w Dr Francia Greaves in the ER at [facility], will transfer to see Dr Diona Browner   Final Clinical Impressions(s) / ED Diagnoses   Final diagnoses:  Gingival bleeding    ED Discharge Orders    None       Ripley Fraise, MD 01/24/18 [phone]

## 2018-01-24 NOTE — Transfer of Care (Signed)
Immediate Anesthesia Transfer of Care Note  Patient: Taylor Obrien  Procedure(s) Performed: SUTURE ORAL WOUND (N/A Mouth)  Patient Location: PACU  Anesthesia Type:General  Level of Consciousness: awake, alert , oriented and patient cooperative  Airway & Oxygen Therapy: Patient Spontanous Breathing and Patient connected to face mask oxygen  Post-op Assessment: Report given to RN and Post -op Vital signs reviewed and stable  Post vital signs: Reviewed and stable  Last Vitals:  Vitals:   01/24/18 1115 01/24/18 1329  BP: (!) 162/113 126/87  Pulse: (!) 104 (!) 107  Resp:  20  Temp:  (P) 36.6 C  SpO2: 94% 98%    Last Pain:  Vitals:   01/24/18 1003  TempSrc:   PainSc: 5          Complications: No apparent anesthesia complications

## 2018-01-24 NOTE — Op Note (Signed)
NAME:  Taylor Obrien, ESCO NO.:  [id number]  MEDICAL RECORD NO.:  [id number]  LOCATION:  APA09                         FACILITY:  [facility]  PHYSICIAN:  Gae Bon, M.D.  DATE OF BIRTH:  03-08-1966  DATE OF PROCEDURE:  01/24/2018 DATE OF DISCHARGE:                              OPERATIVE REPORT   PREOPERATIVE DIAGNOSIS:  Postoperative oral bleeding from incision.  POSTOPERATIVE DIAGNOSIS:  Postoperative oral bleeding from incision.  PROCEDURE:  Suture oral incisions, right maxilla, left mandible.  SURGEON:  Gae Bon, MD.  ANESTHESIA:  General, oral intubation, Glennon Mac attending.  DESCRIPTION OF PROCEDURE:  The patient was taken to the operating room and placed on the table in supine position.  General anesthesia was administered and an oral endotracheal tube was placed and secured.  The eyes were protected.  The patient was then draped for surgery.  Time-out was performed.  The posterior pharynx was suctioned.  A throat pack was placed.  The oral cavity was then irrigated and suctioned with a Yankauer suction and there was found to be mild but constant oozing from the maxillary right as well as the mandibular lower left.  Local anesthetic with 2% lidocaine with 1:100,000 epinephrine was infiltrated into both of these areas.  Then, 3-0 chromic gut sutures were used to suture horizontal mattress sutures in the left anterior mandible and then attention was turned to the right maxilla and the posterior area around teeth numbers 1, 2 and 3.  The previously placed chromic sutures were removed.  The area was debrided with curettes and then irrigated. Then, a vertical posterior releasing incision was created and the periosteal tissue was excised to allow for lengthening of the buccal flap.  This was tested to make sure it reached across to provide primary closure and this was in fact true.  Then, the thrombin-soaked Gelfoam sponges were placed in the extraction  sockets of teeth numbers 1, 2, and 3 and then the area was closed with 3-0 Vicryl continuous interlocking suture as well as horizontal mattress sutures.  Then, the oral cavity was inspected.  The areas were hemostasis.  The oral cavity was irrigated with normal saline suction and throat pack was removed.  The patient was left in the care of Anesthesia for extubation and transportation to the recovery room.  Plan was to take the patient for 23-hour discharge if there was any further needed attention.  ESTIMATED BLOOD LOSS:  Minimal.  COMPLICATIONS:  None.  SPECIMENS:  None.     Gae Bon, M.D.     SMJ/MEDQ  D:  01/24/2018  T:  01/24/2018  Job:  [id number]

## 2018-01-24 NOTE — ED Notes (Signed)
Report given to Jerene Pitch, Therapist, sports (Camera operator at [employer] ED).

## 2018-01-24 NOTE — ED Notes (Signed)
Thrombi pad and 4x4 applied to right upper gumline by MD at this time.

## 2018-01-24 NOTE — ED Notes (Signed)
Report given to [facility] with [facility] at this time.

## 2018-01-25 ENCOUNTER — Encounter (HOSPITAL_COMMUNITY): Payer: Self-pay | Admitting: Oral Surgery

## 2018-01-25 LAB — GLUCOSE, CAPILLARY
Glucose-Capillary: 152 mg/dL — ABNORMAL HIGH (ref 65–99)
Glucose-Capillary: 154 mg/dL — ABNORMAL HIGH (ref 65–99)
Glucose-Capillary: 110 mg/dL — ABNORMAL HIGH (ref 65–99)

## 2018-01-25 NOTE — Progress Notes (Signed)
Patient discharged to home with instructions. 

## 2018-01-25 NOTE — Discharge Summary (Signed)
Patient ID: Taylor Obrien MRN: [id number] DOB/AGE: [birth year] 50 y.o.  Admit date: 01/24/2018 Discharge date: 01/25/2018  Admission Diagnoses: postoperative bleeding  Discharge Diagnoses:  Active Problems:   Post-operative state   Discharged Condition: good    Hospital Course: Admitted 01/24/2018. Underwent surgery to debride and re-close incisions.  Consults: None  Significant Diagnostic Studies: none  Treatments: IV hydration, analgesia: percocet and surgery: see op note 01/24/2018  Discharge Exam: Blood pressure 132/80, pulse 82, temperature 98.7 F (37.1 C), temperature source Oral, resp. rate 17, height 5\' 5"  (1.651 m), weight 208 lb 14.4 oz (94.8 kg), SpO2 98 %. General appearance: alert, cooperative and no distress Head: Normocephalic, without obvious abnormality, atraumatic Eyes: negative Nose: Nares normal. Septum midline. Mucosa normal. No drainage or sinus tenderness. Throat: hemostatic. mild to moderate edema. pharynx clear. no trismus Neck: no adenopathy, supple, symmetrical, trachea midline and thyroid not enlarged, symmetric, no tenderness/mass/nodules Resp: clear to auscultation bilaterally Cardio: regular rate and rhythm, S1, S2 normal, no murmur, click, rub or gallop  Disposition: 01-Home or Self Care  Discharge Instructions    Call MD for:  difficulty breathing, headache or visual disturbances   Complete by:  As directed    Call MD for:  persistant nausea and vomiting   Complete by:  As directed    Call MD for:  severe uncontrolled pain   Complete by:  As directed    Call MD for:  temperature >100.4   Complete by:  As directed    Diet - low sodium heart healthy   Complete by:  As directed    Soft Diet. Advance as tolerated.   Discharge instructions   Complete by:  As directed    Warm salt water mouth rinses 4-5 times per day starting the day after surgery. Ice to affected area for 2-3 days. Soft diet, advance as tolerated. No smoking for 2  weeks. Follow-up visit with Dr. Hoyt Koch as scheduled. Call [phone] for problems.   Increase activity slowly   Complete by:  As directed         Signed: Diona Browner 01/25/2018, 9:43 AM

## 2018-01-26 LAB — GLUCOSE, CAPILLARY: Glucose-Capillary: 108 mg/dL — ABNORMAL HIGH (ref 65–99)

## 2018-03-16 LAB — GLUCOSE, POCT (MANUAL RESULT ENTRY): POC GLUCOSE: 127 mg/dL — AB (ref 70–99)

## 2018-03-22 ENCOUNTER — Encounter (HOSPITAL_COMMUNITY): Payer: Self-pay | Admitting: Emergency Medicine

## 2018-03-22 ENCOUNTER — Emergency Department (HOSPITAL_COMMUNITY)
Admission: EM | Admit: 2018-03-22 | Discharge: 2018-03-22 | Disposition: A | Discharge status: Home / Self Care | Payer: Medicaid Other | Attending: Emergency Medicine | Admitting: Emergency Medicine

## 2018-03-22 ENCOUNTER — Other Ambulatory Visit: Payer: Self-pay

## 2018-03-22 ENCOUNTER — Emergency Department (HOSPITAL_COMMUNITY): Payer: Medicaid Other

## 2018-03-22 DIAGNOSIS — S40012A Contusion of left shoulder, initial encounter: Secondary | ICD-10-CM | POA: Diagnosis not present

## 2018-03-22 DIAGNOSIS — Y939 Activity, unspecified: Secondary | ICD-10-CM | POA: Insufficient documentation

## 2018-03-22 DIAGNOSIS — Z7984 Long term (current) use of oral hypoglycemic drugs: Secondary | ICD-10-CM | POA: Insufficient documentation

## 2018-03-22 DIAGNOSIS — W19XXXA Unspecified fall, initial encounter: Secondary | ICD-10-CM | POA: Diagnosis not present

## 2018-03-22 DIAGNOSIS — Z79899 Other long term (current) drug therapy: Secondary | ICD-10-CM | POA: Insufficient documentation

## 2018-03-22 DIAGNOSIS — Y9289 Other specified places as the place of occurrence of the external cause: Secondary | ICD-10-CM | POA: Diagnosis not present

## 2018-03-22 DIAGNOSIS — S7012XA Contusion of left thigh, initial encounter: Secondary | ICD-10-CM | POA: Insufficient documentation

## 2018-03-22 DIAGNOSIS — Z7982 Long term (current) use of aspirin: Secondary | ICD-10-CM | POA: Diagnosis not present

## 2018-03-22 DIAGNOSIS — I10 Essential (primary) hypertension: Secondary | ICD-10-CM | POA: Diagnosis not present

## 2018-03-22 DIAGNOSIS — Y999 Unspecified external cause status: Secondary | ICD-10-CM | POA: Diagnosis not present

## 2018-03-22 DIAGNOSIS — S0990XA Unspecified injury of head, initial encounter: Secondary | ICD-10-CM | POA: Diagnosis present

## 2018-03-22 DIAGNOSIS — E119 Type 2 diabetes mellitus without complications: Secondary | ICD-10-CM | POA: Diagnosis not present

## 2018-03-22 DIAGNOSIS — F17210 Nicotine dependence, cigarettes, uncomplicated: Secondary | ICD-10-CM | POA: Insufficient documentation

## 2018-03-22 DIAGNOSIS — S0093XA Contusion of unspecified part of head, initial encounter: Secondary | ICD-10-CM | POA: Diagnosis not present

## 2018-03-22 MED ORDER — METFORMIN HCL 500 MG PO TABS: 500.0000 mg | ORAL_TABLET | Freq: Two times a day (BID) | ORAL | 0 refills | Status: DC

## 2018-03-22 MED ORDER — TRAMADOL HCL 50 MG PO TABS: 50.0000 mg | ORAL_TABLET | Freq: Four times a day (QID) | ORAL | 0 refills | Status: DC | PRN

## 2018-03-22 NOTE — ED Triage Notes (Signed)
Patient c/o left side shoulder, arm, hip, leg, and knee pain after falling last night. Per patient states was using walker and when he went to turn around he fell backwards landing on left side. Denies hitting head or LOC. Denies any dizziness or blurred vision. Per patient uses walker for left side weakness due to stroke.

## 2018-03-22 NOTE — Discharge Instructions (Addendum)
Follow-up with your doctor as planned 

## 2018-03-22 NOTE — ED Provider Notes (Signed)
[facility] EMERGENCY DEPARTMENT Provider Note   CSN: [id number] Arrival date & time: 03/22/18  1227     History   Chief Complaint Chief Complaint  Patient presents with  . Fall    HPI Taylor Obrien is a 52 y.o. male.  Patient states that he fell his head left shoulder and left hip.  Questionable loss of consciousness  The history is provided by the patient. No language interpreter was used.  Fall  This is a new problem. The current episode started yesterday. The problem occurs rarely. The problem has been resolved. Pertinent negatives include no chest pain, no abdominal pain and no headaches. Nothing aggravates the symptoms. Nothing relieves the symptoms. He has tried nothing for the symptoms. The treatment provided no relief.    Past Medical History:  Diagnosis Date  . Arthritis   . Chronic pain   . Depression   . Diabetes mellitus without complication ([place])   . Essential hypertension, benign   . GERD (gastroesophageal reflux disease)   . Headache   . HTN (hypertension) 11/27/2015  . Hyperlipidemia   . Sleep apnea    cpap   . Stroke [facility])    2016    Patient Active Problem List   Diagnosis Date Noted  . Post-operative state 01/24/2018  . Spastic hemiplegia affecting nondominant side ([place]) 06/03/2016  . Dysuria   . OSA (obstructive sleep apnea)   . Hypokalemia   . Elevated blood pressure   . Hemiparesis affecting left side as late effect of stroke ([place])   . Epistaxis   . HTN (hypertension) 11/27/2015  . AKI (acute kidney injury) ([place]) 11/27/2015  . Hemiplegia and hemiparesis following unspecified cerebrovascular disease affecting left non-dominant side ([place]) 11/23/2015  . Gait disturbance, post-stroke 11/23/2015  . Stroke due to occlusion of right anterior cerebral artery ([place]) 11/23/2015  . Cerebrovascular accident (CVA) due to occlusion of right anterior cerebral artery ([place])   . Essential hypertension   . Depression   . Chronic pain syndrome   . ETOH  abuse   . Marijuana abuse   . Cerebrovascular accident (CVA) due to thrombosis of right carotid artery ([place])   . Malignant hypertension   . Hyperlipidemia   . CVA (cerebral infarction) 11/18/2015  . Stroke (cerebrum) ([place]) 11/18/2015  . Chest pain 10/09/2012  . GERD (gastroesophageal reflux disease) 10/09/2012  . Chronic back pain 10/09/2012  . Tobacco abuse 10/09/2012  . Hypertensive heart disease 08/31/2012  . Accelerated hypertension 08/31/2012  . Precordial pain 08/31/2012    Past Surgical History:  Procedure Laterality Date  . CLOSED REDUCTION MANDIBULAR FRACTURE W/ ARCH BARS     + multiple extractions  . Condyloma resection    . MULTIPLE EXTRACTIONS WITH ALVEOLOPLASTY Bilateral 01/23/2018   Procedure: DENTAL EXTRACTION OF TEETH NUMBER ONE, TWO, THREE, FOUR, FIVE, SIX, SEVEN, EIGHT, NINE, TEN, ELEVEN, TWELVE, THIRTEEN, FOURTEEN, FIFTEEN, SIXTEEN, SEVENTEEN, TWENTY, TWENTY-ONE, TWENTY-TWO, TWENTY-THREE, TWENTY-FOUR, TWENTY-FIVE, TWENTY-SIX, TWENTY-SEVEN, TWENTY-EIGHT, TWENTY-NINE, THIRTY-TWO WITH ALVEOLOPLASTY;  Surgeon: Diona Browner, DDS;  Location: [place];  Service: Oral Surgery;  Lat  . RADIOLOGY WITH ANESTHESIA N/A 11/18/2015   Procedure: RADIOLOGY WITH ANESTHESIA;  Surgeon: Luanne Bras, MD;  Location: [place];  Service: Radiology;  Laterality: N/A;  . Removal foreign body right shoulder    . Right rotator cuff repair    . TEE WITHOUT CARDIOVERSION N/A 11/21/2015   Procedure: TRANSESOPHAGEAL ECHOCARDIOGRAM (TEE);  Surgeon: Larey Dresser, MD;  Location: [place];  Service: Cardiovascular;  Laterality: N/A;  .  TOOTH EXTRACTION N/A 01/24/2018   Procedure: SUTURE ORAL WOUND;  Surgeon: Diona Browner, DDS;  Location: [place];  Service: Oral Surgery;  Laterality: N/A;        Home Medications    Prior to Admission medications   Medication Sig Start Date End Date Taking? Authorizing Provider  amLODipine (NORVASC) 10 MG tablet Take 1 tablet (10 mg total) by mouth daily.  01/19/16  Yes Kirsteins, [name], MD  aspirin 325 MG tablet Take 1 tablet (325 mg total) by mouth daily. 12/20/15  Yes Love, Ivan Anchors, PA-C  atorvastatin (LIPITOR) 40 MG tablet Take 1 tablet (40 mg total) by mouth daily at 6 PM. Patient taking differently: Take 80 mg by mouth daily at 6 PM.  01/19/16  Yes Kirsteins, [name], MD  canagliflozin (INVOKANA) 300 MG TABS tablet Take 300 mg by mouth daily.   Yes [provider]  cloNIDine (CATAPRES - DOSED IN MG/24 HR) 0.3 mg/24hr patch Place 1 patch (0.3 mg total) onto the skin once a week. Next change on monday Patient taking differently: Place 0.3 mg onto the skin every Wednesday. Next change on monday 12/20/15  Yes Love, Ivan Anchors, PA-C  diphenhydramine-acetaminophen (TYLENOL PM) 25-500 MG TABS tablet Take 1-2 tablets by mouth at bedtime as needed (for sleep). Depends on insomnia if takes 1-2 tablets   Yes [provider]  empagliflozin (JARDIANCE) 25 MG TABS tablet Take 25 mg by mouth daily.    Yes [provider]  escitalopram (LEXAPRO) 10 MG tablet Take 10 mg by mouth daily.   Yes [provider]  folic acid (FOLVITE) 1 MG tablet Take 1 tablet (1 mg total) by mouth daily. 12/20/15  Yes Love, Ivan Anchors, PA-C  labetalol (NORMODYNE) 200 MG tablet Take 1 tablet (200 mg total) by mouth 3 (three) times daily. 01/19/16  Yes Kirsteins, [name], MD  Magnesium Hydroxide (MAGNESIA PO) Take 1 tablet by mouth daily.   Yes [provider]  metFORMIN (GLUCOPHAGE) 500 MG tablet Take 1 tablet (500 mg total) by mouth 2 (two) times daily with a meal. 03/18/17  Yes Forde Dandy, MD  Multiple Vitamin (MULTIVITAMIN WITH MINERALS) TABS tablet Take 1 tablet by mouth daily. 12/20/15  Yes Love, Ivan Anchors, PA-C  oxyCODONE-acetaminophen (PERCOCET) 10-325 MG tablet Take 1 tablet by mouth every 6 (six) hours as needed for pain. 01/23/18  Yes Diona Browner, DDS  pantoprazole (PROTONIX) 40 MG tablet Take 1 tablet (40 mg total) by mouth at  bedtime. 01/19/16  Yes Kirsteins, [name], MD  tamsulosin (FLOMAX) 0.4 MG CAPS capsule Take 0.4 mg by mouth daily.   Yes [provider]  tiZANidine (ZANAFLEX) 4 MG tablet TAKE (2) TABLETS BY MOUTH AT BEDTIME. Patient taking differently: TAKE 2 TABLETS (54m) BY MOUTH AT BEDTIME. 06/13/16  Yes Kirsteins, ALuanna Salk MD  traZODone (DESYREL) 100 MG tablet Take 1 tablet (100 mg total) by mouth at bedtime. 01/19/16  Yes Kirsteins, ALuanna Salk MD  varenicline (CHANTIX PAK) 0.5 MG X 11 & 1 MG X 42 tablet Take 1 mg by mouth 2 (two) times daily. Take one 0.5 mg tablet by mouth once daily for 3 days, then increase to one 0.5 mg tablet twice daily for 4 days, then increase to one 1 mg tablet twice daily.   Yes [provider]  vitamin C (ASCORBIC ACID) 500 MG tablet Take 500 mg by mouth daily.   Yes [provider]  amoxicillin (AMOXIL) 500 MG capsule Take 1 capsule (  500 mg total) by mouth 3 (three) times daily. Patient not taking: Reported on 03/22/2018 01/23/18   Diona Browner, DDS  blood glucose meter kit and supplies KIT Dispense based on patient and insurance preference. Use up to four times daily as directed. (FOR ICD-9 250.00, 250.01). 03/18/17   Forde Dandy, MD  glucose blood test strip Use as instructed 03/18/17   Forde Dandy, MD  metFORMIN (GLUCOPHAGE) 500 MG tablet Take 1 tablet (500 mg total) by mouth 2 (two) times daily with a meal. 03/22/18   Milton Ferguson, MD  traMADol (ULTRAM) 50 MG tablet Take 1 tablet (50 mg total) by mouth every 6 (six) hours as needed. 03/22/18   Milton Ferguson, MD    Family History Family History  Problem Relation Age of Onset  . Diabetes Mother   . Hypertension Mother   . Drug abuse Mother   . Hyperlipidemia Mother   . Diabetes Father   . Hypertension Father   . Hyperlipidemia Father   . Diabetes Brother   . Hypertension Brother     Social History Social History   Tobacco Use  . Smoking status: Current Every Day Smoker    Packs/day:  0.50    Years: 30.00    Pack years: 15.00    Types: Cigarettes    Last attempt to quit: 11/17/2015    Years since quitting: 2.3  . Smokeless tobacco: Never Used  Substance Use Topics  . Alcohol use: No    Alcohol/week: 0.0 oz  . Drug use: Yes    Types: Marijuana     Allergies   Oxcarbazepine   Review of Systems Review of Systems  Constitutional: Negative for appetite change and fatigue.  HENT: Negative for congestion, ear discharge and sinus pressure.        Mild headache  Eyes: Negative for discharge.  Respiratory: Negative for cough.   Cardiovascular: Negative for chest pain.  Gastrointestinal: Negative for abdominal pain and diarrhea.  Genitourinary: Negative for frequency and hematuria.  Musculoskeletal: Negative for back pain.       Left femur and left shoulder pain  Skin: Negative for rash.  Neurological: Negative for seizures and headaches.  Psychiatric/Behavioral: Negative for hallucinations.     Physical Exam Updated Vital Signs BP (!) 130/98   Pulse 67   Temp 98.4 F (36.9 C) (Oral)   Resp 18   Ht 5' 5"  (1.651 m)   Wt 97.5 kg (215 lb)   SpO2 96%   BMI 35.78 kg/m   Physical Exam  Constitutional: He is oriented to person, place, and time. He appears well-developed.  HENT:  Head: Normocephalic.  Tenderness to posterior neck and left head  Eyes: Conjunctivae and EOM are normal. No scleral icterus.  Neck: Neck supple. No thyromegaly present.  Cardiovascular: Normal rate and regular rhythm. Exam reveals no gallop and no friction rub.  No murmur heard. Pulmonary/Chest: No stridor. He has no wheezes. He has no rales. He exhibits no tenderness.  Abdominal: He exhibits no distension. There is no tenderness. There is no rebound.  Musculoskeletal: Normal range of motion. He exhibits no edema.  Tenderness to left shoulder and left femur  Lymphadenopathy:    He has no cervical adenopathy.  Neurological: He is oriented to person, place, and time. He exhibits  normal muscle tone. Coordination normal.  Skin: No rash noted. No erythema.  Psychiatric: He has a normal mood and affect. His behavior is normal.     ED Treatments / Results  Labs (all labs ordered are listed, but only abnormal results are displayed) Labs Reviewed - No data to display  EKG None  Radiology Dg Chest 2 View  Result Date: 03/22/2018 CLINICAL DATA:  53 year old male status post fall with left side pain. EXAM: CHEST - 2 VIEW COMPARISON:  Chest radiographs 09/21/2017 and earlier. FINDINGS: Mildly lower lung volumes. Mediastinal contours are stable and within normal limits. Visualized tracheal air column is within normal limits. No pneumothorax, pulmonary edema, pleural effusion or confluent pulmonary opacity. Mild bilateral crowding of lung markings. No acute osseous abnormality identified. Negative visible bowel gas pattern. IMPRESSION: No acute cardiopulmonary abnormality or acute traumatic injury identified. Electronically Signed   By: Genevie Ann M.D.   On: 03/22/2018 15:16   Ct Head Wo Contrast  Result Date: 03/22/2018 CLINICAL DATA:  52 year old male status post fall with left side pain. EXAM: CT HEAD WITHOUT CONTRAST CT CERVICAL SPINE WITHOUT CONTRAST TECHNIQUE: Multidetector CT imaging of the head and cervical spine was performed following the standard protocol without intravenous contrast. Multiplanar CT image reconstructions of the cervical spine were also generated. COMPARISON:  Face CT 01/24/2018. Brain MRI 11/19/2015. CTA head and neck 11/18/2015. cervical spine MRI 12/16/2008. FINDINGS: CT HEAD FINDINGS Brain: Chronic encephalomalacia in the right ACA territory related to the 2016 infarct there. A small cortically based infarct has occurred in the posterior left MCA territory since that time with encephalomalacia (series 3, image 20). Stable gray-white matter differentiation elsewhere in the brain. No midline shift, ventriculomegaly, mass effect, evidence of mass lesion,  intracranial hemorrhage or evidence of cortically based acute infarction. Vascular: Calcified atherosclerosis at the skull base. Skull: Stable and intact. Sinuses/Orbits: Visualized paranasal sinuses and mastoids are stable and well pneumatized. Other: Stable and negative visible orbit and scalp soft tissues. CT CERVICAL SPINE FINDINGS Alignment: Increase straightening of cervical lordosis since 2010. Cervicothoracic junction alignment is within normal limits. Bilateral posterior element alignment is within normal limits. 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 Visualized skull base is intact. No atlanto-occipital dissociation. Stable C1-C2 alignment since 2016. No acute cervical spine fracture. Soft tissues and spinal canal: Chronic myelomalacia in volume loss of the cervical spinal cord at the C1-C2 level, better demonstrated on the 2010 MRI. Negative noncontrast neck soft tissues. Disc levels: Ligamentous degeneration about the chronically abnormal odontoid and posterior C2 body. Chronic spinal stenosis at C2 level. Lower cervical disc and endplate degeneration without definite additional cervical spinal stenosis. Upper chest: Visible upper thoracic levels appear intact. Negative lung apices. IMPRESSION: 1. No acute traumatic injury identified in the head or cervical spine. 2. Chronic right ACA territory infarct, stable since 2016. Interval posterior left MCA infarct, with associated chronic encephalomalacia. 3. 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 Electronically Signed   By: Genevie Ann M.D.   On: 03/22/2018 15:27   Ct Cervical Spine Wo Contrast  Result Date: 03/22/2018 CLINICAL DATA:  52 year old male status post fall with left side pain. EXAM: CT HEAD WITHOUT CONTRAST CT CERVICAL SPINE WITHOUT CONTRAST TECHNIQUE: Multidetector CT imaging of the head and cervical  spine was performed following the standard protocol without intravenous contrast. Multiplanar CT image reconstructions of the cervical spine were also generated. COMPARISON:  Face CT 01/24/2018. Brain MRI 11/19/2015. CTA head and neck 11/18/2015. cervical spine MRI 12/16/2008. FINDINGS: CT HEAD FINDINGS Brain: Chronic encephalomalacia in the right ACA territory related to the 2016  infarct there. A small cortically based infarct has occurred in the posterior left MCA territory since that time with encephalomalacia (series 3, image 20). Stable gray-white matter differentiation elsewhere in the brain. No midline shift, ventriculomegaly, mass effect, evidence of mass lesion, intracranial hemorrhage or evidence of cortically based acute infarction. Vascular: Calcified atherosclerosis at the skull base. Skull: Stable and intact. Sinuses/Orbits: Visualized paranasal sinuses and mastoids are stable and well pneumatized. Other: Stable and negative visible orbit and scalp soft tissues. CT CERVICAL SPINE FINDINGS Alignment: Increase straightening of cervical lordosis since 2010. Cervicothoracic junction alignment is within normal limits. Bilateral posterior element alignment is within normal limits. 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 Visualized skull base is intact. No atlanto-occipital dissociation. Stable C1-C2 alignment since 2016. No acute cervical spine fracture. Soft tissues and spinal canal: Chronic myelomalacia in volume loss of the cervical spinal cord at the C1-C2 level, better demonstrated on the 2010 MRI. Negative noncontrast neck soft tissues. Disc levels: Ligamentous degeneration about the chronically abnormal odontoid and posterior C2 body. Chronic spinal stenosis at C2 level. Lower cervical disc and endplate degeneration without definite additional cervical spinal stenosis. Upper chest: Visible upper thoracic levels appear intact. Negative lung apices. IMPRESSION: 1.  No acute traumatic injury identified in the head or cervical spine. 2. Chronic right ACA territory infarct, stable since 2016. Interval posterior left MCA infarct, with associated chronic encephalomalacia. 3. 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 Electronically Signed   By: Genevie Ann M.D.   On: 03/22/2018 15:27   Dg Shoulder Left  Result Date: 03/22/2018 CLINICAL DATA:  52 year old male status post fall with left side pain. EXAM: LEFT SHOULDER - 2+ VIEW COMPARISON:  Chest radiographs today and earlier. Left shoulder series 1618. FINDINGS: AP and scapular Y-views. No glenohumeral joint dislocation. Stable bone mineralization. The proximal left humerus appears stable and intact. The left clavicle and scapula appear stable and intact. Negative visible left ribs. IMPRESSION: No acute fracture or dislocation identified about the left shoulder. Electronically Signed   By: Genevie Ann M.D.   On: 03/22/2018 15:16   Dg Femur Min 2 Views Left  Addendum Date: 03/22/2018   ADDENDUM REPORT: 03/22/2018 17:00 ADDENDUM: The original report was by Dr. Joni Fears. The following addendum is by Dr. Fabiola Backer: The first impression should read: "No acute fracture or dislocation about the left femur." Electronically Signed   By: Titus Dubin M.D.   On: 03/22/2018 17:00   Result Date: 03/22/2018 CLINICAL DATA:  52 year old male status post fall with left side pain. EXAM: LEFT FEMUR 2 VIEWS COMPARISON:  Lumbar radiographs 07/02/2012. FINDINGS: The left femoral head remains normally located. The left hip joint space appears stable and normal. The visible left hemipelvis appears intact. The left SI joint appears stable. The proximal left femur is intact. The left femoral shaft is intact. The distal left femur and alignment at the left knee appear intact. No left knee joint effusion. Calcified peripheral vascular disease in  the left lower extremity. IMPRESSION: 1. Serve fracture left femur 2. Left lower extremity calcified vascular disease. Electronically Signed: By: Genevie Ann M.D. On: 03/22/2018 15:18    Procedures Procedures (including critical care time)  Medications Ordered in ED Medications - No data to display   Initial Impression / Assessment and Plan / ED Course  I have reviewed the triage vital signs and the  nursing notes.  Pertinent labs & imaging results that were available during my care of the patient were reviewed by me and considered in my medical decision making (see chart for details).     X-rays of femur shoulder head and neck are unremarkable.  Patient has contusions to shoulder head and left upper leg.  He will be given Ultram.  He is also given a prescription of Glucophage because he is running out  Final Clinical Impressions(s) / ED Diagnoses   Final diagnoses:  Fall, initial encounter    ED Discharge Orders        Ordered    traMADol (ULTRAM) 50 MG tablet  Every 6 hours PRN     03/22/18 1754    metFORMIN (GLUCOPHAGE) 500 MG tablet  2 times daily with meals     03/22/18 1754       Milton Ferguson, MD 03/22/18 1759

## 2018-03-22 NOTE — ED Notes (Signed)
Pt's family given soda and cracker. Pt asleep at this time.

## 2018-04-12 IMAGING — US US EXTREM LOW VENOUS*L*
1 series · 13 of 24 positions shown · non-contrast
Comparison: None.

CLINICAL DATA: Left lower extremity pain, swelling



[Series 1: us extrem low venous*left* · 0.07mm/px · 13 of 55 slices shown]
[im 1/55]
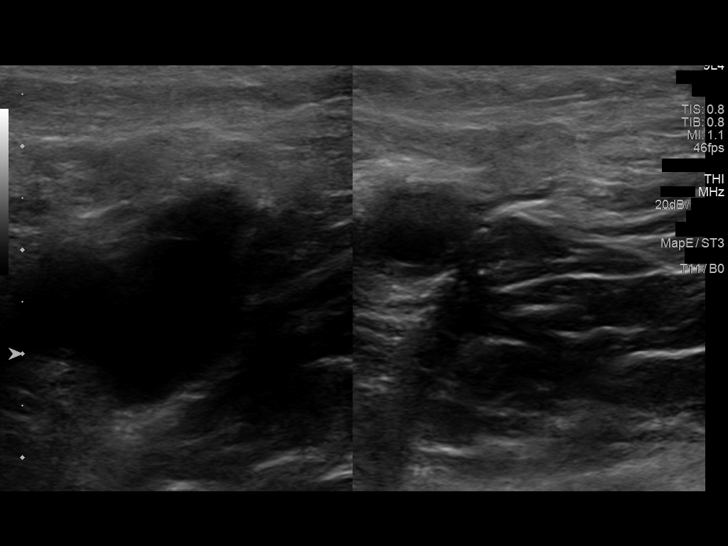
[im 5/55]
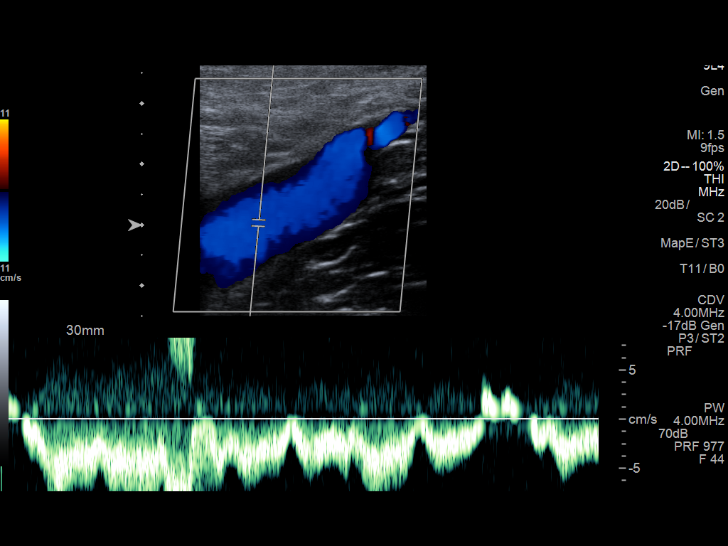
[im 10/55]
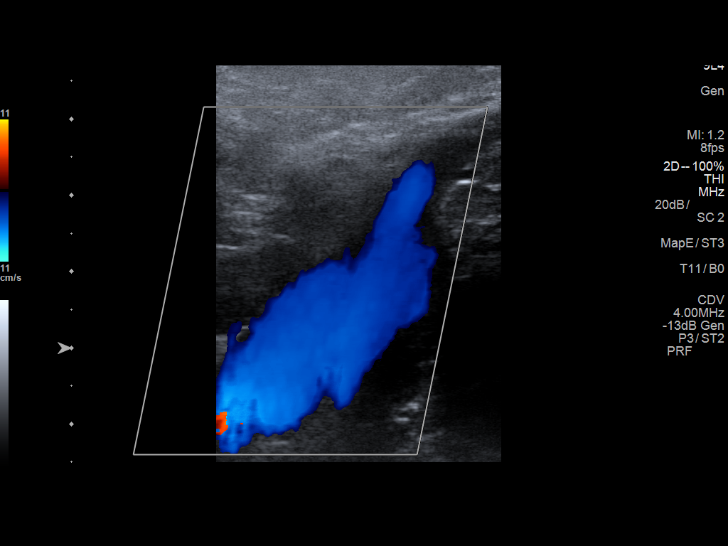
[im 15/55]
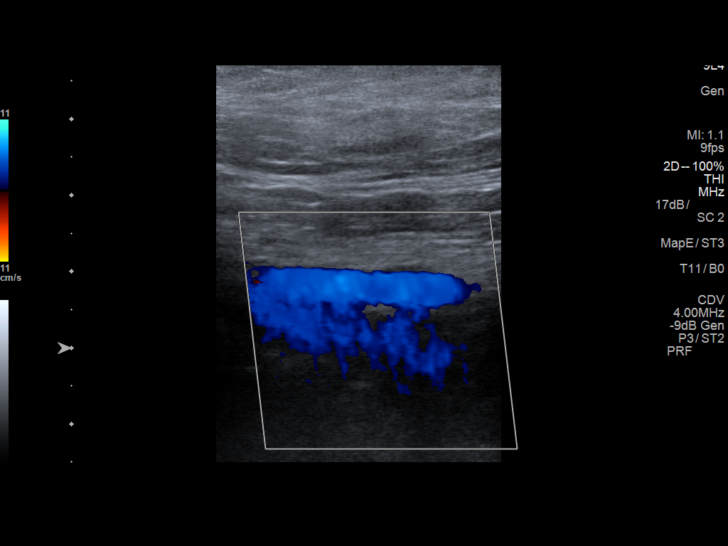
[im 19/55]
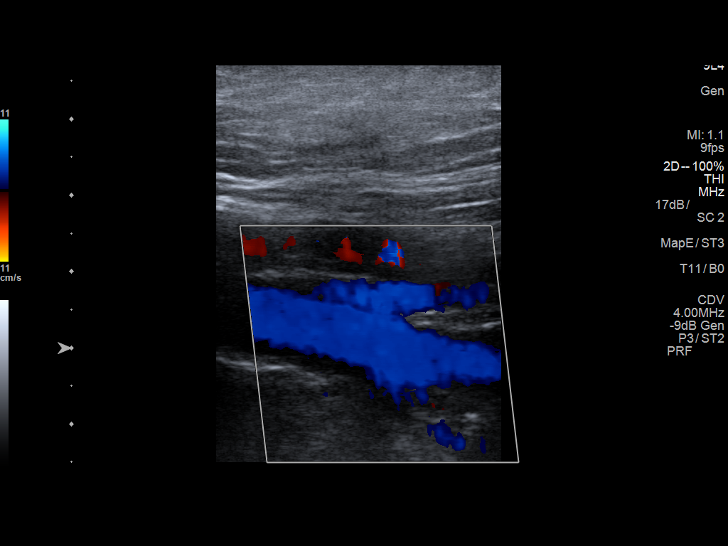
[im 24/55]
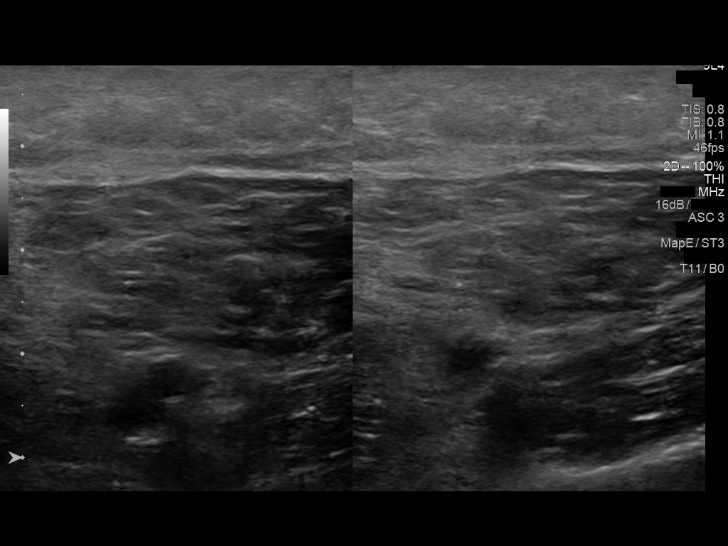
[im 29/55]
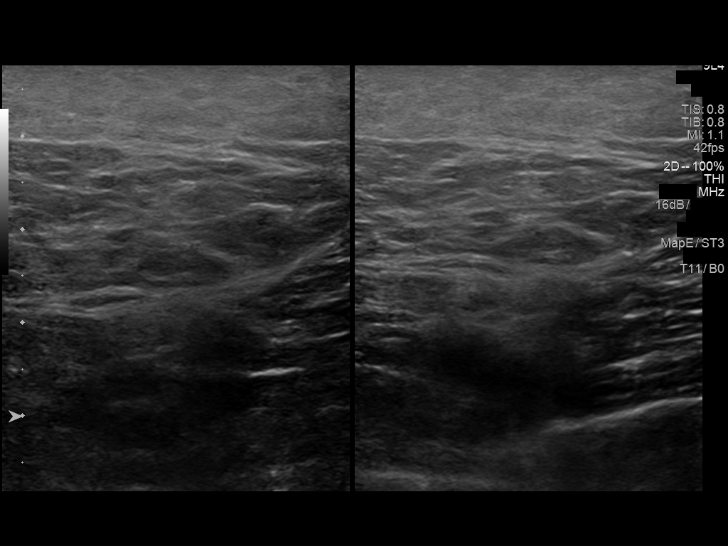
[im 31/55]
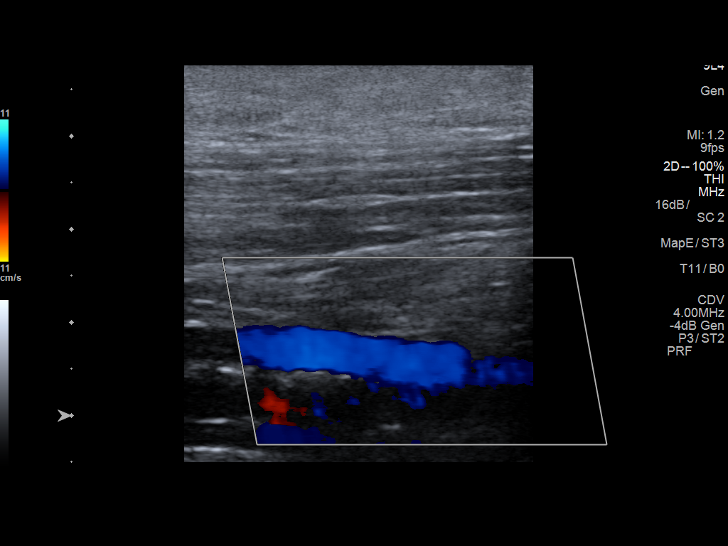
[im 36/55]
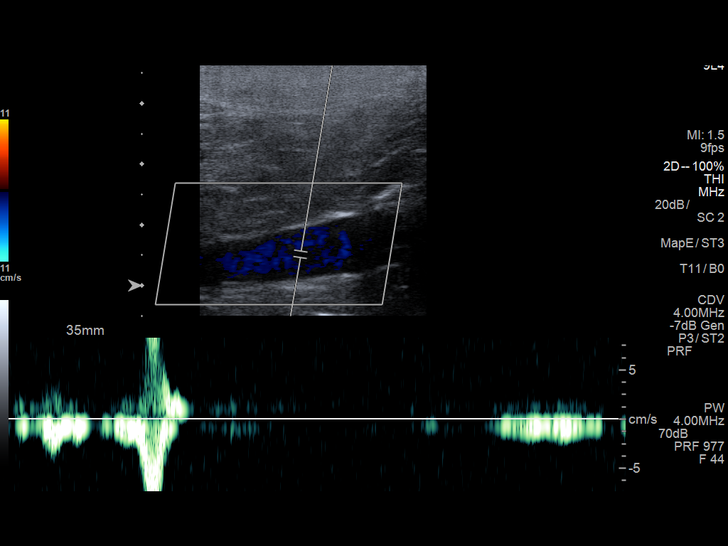
[im 40/55]
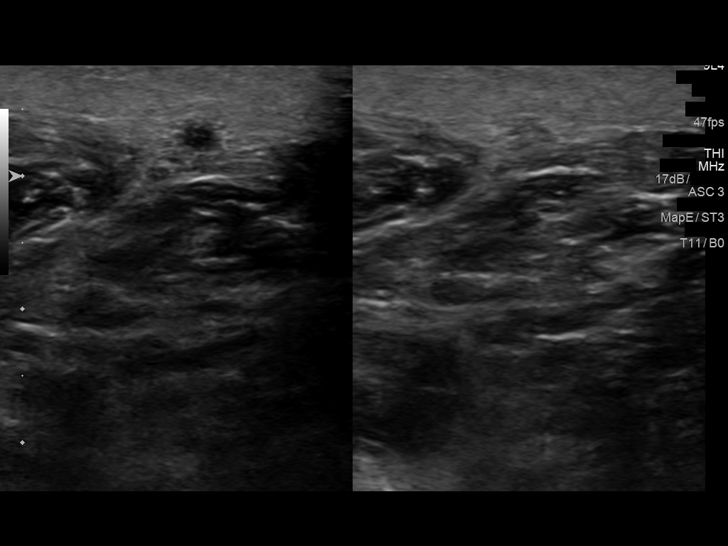
[im 45/55]
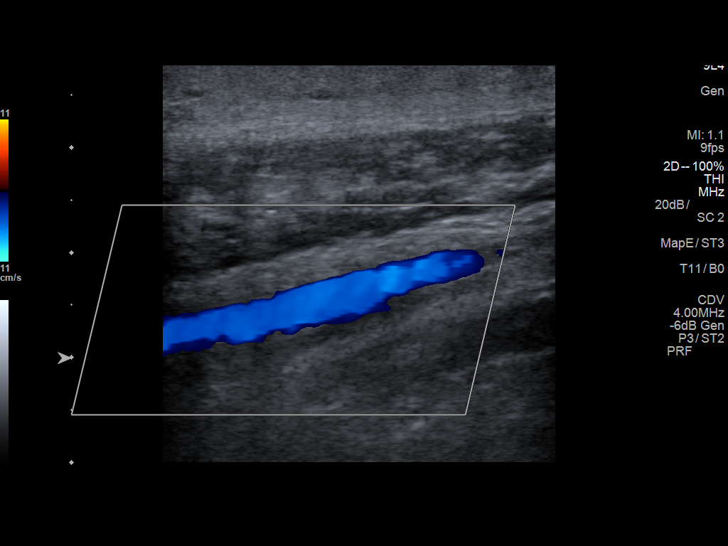
[im 50/55]
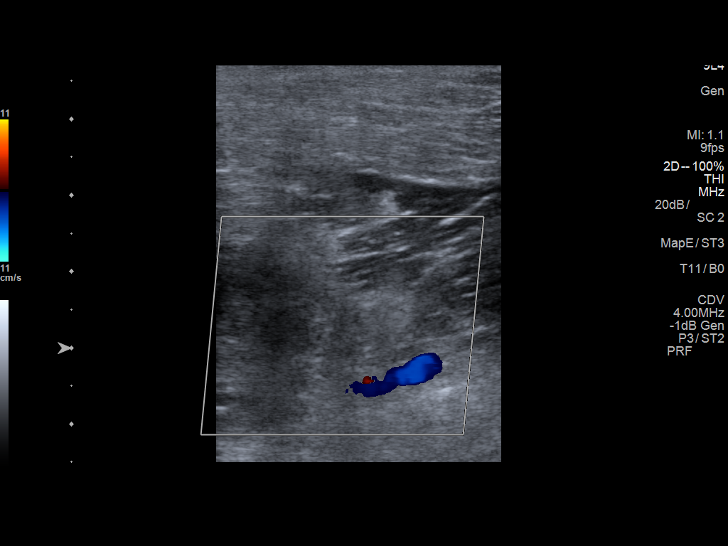
[im 55/55]
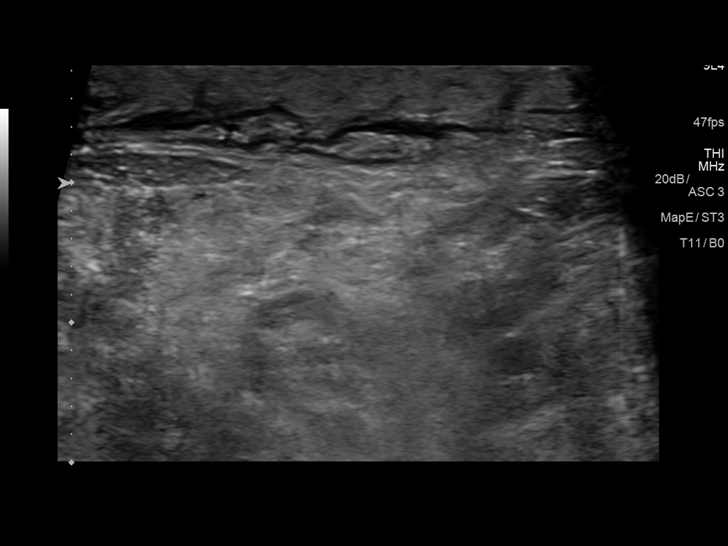

[13 of 24 positions shown; findings below may reference images not displayed]

FINDINGS: Contralateral Common Femoral Vein: Respiratory phasicity is normal
and symmetric with the symptomatic side. No evidence of thrombus.
Normal compressibility.

Common Femoral Vein: No evidence of thrombus. Normal
compressibility, respiratory phasicity and response to augmentation.

Saphenofemoral Junction: No evidence of thrombus. Normal
compressibility and flow on color Doppler imaging.

Profunda Femoral Vein: No evidence of thrombus. Normal
compressibility and flow on color Doppler imaging.

Femoral Vein: No evidence of thrombus. Normal compressibility,
respiratory phasicity and response to augmentation.

Popliteal Vein: No evidence of thrombus. Normal compressibility,
respiratory phasicity and response to augmentation.

Calf Veins: No evidence of thrombus. Normal compressibility and flow
on color Doppler imaging.

Superficial Great Saphenous Vein: No evidence of thrombus. Normal
compressibility.

Venous Reflux:  None.

Other Findings:  None.
IMPRESSION: No evidence of deep venous thrombosis.

## 2018-07-06 ENCOUNTER — Other Ambulatory Visit: Payer: Self-pay

## 2018-07-06 ENCOUNTER — Emergency Department (HOSPITAL_COMMUNITY): Payer: Medicare Other

## 2018-07-06 ENCOUNTER — Encounter (HOSPITAL_COMMUNITY): Payer: Self-pay

## 2018-07-06 ENCOUNTER — Emergency Department (HOSPITAL_COMMUNITY)
Admission: EM | Admit: 2018-07-06 | Discharge: 2018-07-06 | Disposition: A | Discharge status: Home / Self Care | Payer: Medicare Other | Attending: Emergency Medicine | Admitting: Emergency Medicine

## 2018-07-06 DIAGNOSIS — Z79899 Other long term (current) drug therapy: Secondary | ICD-10-CM | POA: Insufficient documentation

## 2018-07-06 DIAGNOSIS — M542 Cervicalgia: Secondary | ICD-10-CM | POA: Diagnosis present

## 2018-07-06 DIAGNOSIS — W19XXXA Unspecified fall, initial encounter: Secondary | ICD-10-CM

## 2018-07-06 DIAGNOSIS — M25512 Pain in left shoulder: Secondary | ICD-10-CM | POA: Diagnosis not present

## 2018-07-06 DIAGNOSIS — Z7984 Long term (current) use of oral hypoglycemic drugs: Secondary | ICD-10-CM | POA: Insufficient documentation

## 2018-07-06 DIAGNOSIS — E119 Type 2 diabetes mellitus without complications: Secondary | ICD-10-CM | POA: Insufficient documentation

## 2018-07-06 DIAGNOSIS — Y998 Other external cause status: Secondary | ICD-10-CM | POA: Insufficient documentation

## 2018-07-06 DIAGNOSIS — F17210 Nicotine dependence, cigarettes, uncomplicated: Secondary | ICD-10-CM | POA: Diagnosis not present

## 2018-07-06 DIAGNOSIS — W11XXXA Fall on and from ladder, initial encounter: Secondary | ICD-10-CM | POA: Diagnosis not present

## 2018-07-06 DIAGNOSIS — M25552 Pain in left hip: Secondary | ICD-10-CM | POA: Diagnosis not present

## 2018-07-06 DIAGNOSIS — Y9289 Other specified places as the place of occurrence of the external cause: Secondary | ICD-10-CM | POA: Insufficient documentation

## 2018-07-06 DIAGNOSIS — Z8673 Personal history of transient ischemic attack (TIA), and cerebral infarction without residual deficits: Secondary | ICD-10-CM | POA: Diagnosis not present

## 2018-07-06 DIAGNOSIS — M25562 Pain in left knee: Secondary | ICD-10-CM | POA: Insufficient documentation

## 2018-07-06 DIAGNOSIS — Y9389 Activity, other specified: Secondary | ICD-10-CM | POA: Diagnosis not present

## 2018-07-06 DIAGNOSIS — Z7982 Long term (current) use of aspirin: Secondary | ICD-10-CM | POA: Insufficient documentation

## 2018-07-06 MED ORDER — HYDROCODONE-ACETAMINOPHEN 5-325 MG PO TABS: ORAL_TABLET | ORAL | 0 refills | Status: DC

## 2018-07-06 MED ORDER — CYCLOBENZAPRINE HCL 10 MG PO TABS: 10.0000 mg | ORAL_TABLET | Freq: Three times a day (TID) | ORAL | 0 refills | Status: DC | PRN

## 2018-07-06 MED ORDER — OXYCODONE-ACETAMINOPHEN 5-325 MG PO TABS
ORAL_TABLET | ORAL | Status: AC
  Filled 2018-07-06: qty 1

## 2018-07-06 MED ORDER — OXYCODONE-ACETAMINOPHEN 5-325 MG PO TABS
1.0000 | ORAL_TABLET | Freq: Once | ORAL | Status: AC
  Administered 2018-07-06: 1 via ORAL

## 2018-07-06 NOTE — ED Notes (Signed)
Pt states "the pain is a level 7, it has not changed. The medication did not change the pain. I am not a weed head"

## 2018-07-06 NOTE — ED Triage Notes (Signed)
Pt reports that he was swimming at the [facility] and was getting out of pool and fell on his left side. Pt is hurting on entire left side. Pt is wheelchair bound and only able to transfer

## 2018-07-06 NOTE — Discharge Instructions (Addendum)
Alternate ice and heat to your neck and shoulder.  Follow-up with your primary doctor for recheck.

## 2018-07-08 NOTE — ED Provider Notes (Signed)
[facility] EMERGENCY DEPARTMENT Provider Note   CSN: [id number] Arrival date & time: 07/06/18  1819     History   Chief Complaint Chief Complaint  Patient presents with  . Fall    HPI Taylor Obrien is a 52 y.o. male.  HPI   ENDI LAGMAN is a 52 y.o. male with hx of DM, CVA with left sided weakness and chronic pain, presents to the Emergency Department complaining of pain to his left neck,left hip,shoulder and knee secondary to a slip on a wet ladder while getting out of a pool. Incident occurred shortly before ER arrival.  States he swims as therapy secondary to a CVA.  Pain is associated with movement.  He denies head injury or LOC, headache, dizziness, low back pain.     Past Medical History:  Diagnosis Date  . Arthritis   . Chronic pain   . Depression   . Diabetes mellitus without complication ([place])   . Essential hypertension, benign   . GERD (gastroesophageal reflux disease)   . Headache   . HTN (hypertension) 11/27/2015  . Hyperlipidemia   . Sleep apnea    cpap   . Stroke [facility])    2016    Patient Active Problem List   Diagnosis Date Noted  . Post-operative state 01/24/2018  . Spastic hemiplegia affecting nondominant side ([place]) 06/03/2016  . Dysuria   . OSA (obstructive sleep apnea)   . Hypokalemia   . Elevated blood pressure   . Hemiparesis affecting left side as late effect of stroke ([place])   . Epistaxis   . HTN (hypertension) 11/27/2015  . AKI (acute kidney injury) ([place]) 11/27/2015  . Hemiplegia and hemiparesis following unspecified cerebrovascular disease affecting left non-dominant side ([place]) 11/23/2015  . Gait disturbance, post-stroke 11/23/2015  . Stroke due to occlusion of right anterior cerebral artery ([place]) 11/23/2015  . Cerebrovascular accident (CVA) due to occlusion of right anterior cerebral artery ([place])   . Essential hypertension   . Depression   . Chronic pain syndrome   . ETOH abuse   . Marijuana abuse   . Cerebrovascular accident  (CVA) due to thrombosis of right carotid artery ([place])   . Malignant hypertension   . Hyperlipidemia   . CVA (cerebral infarction) 11/18/2015  . Stroke (cerebrum) ([place]) 11/18/2015  . Chest pain 10/09/2012  . GERD (gastroesophageal reflux disease) 10/09/2012  . Chronic back pain 10/09/2012  . Tobacco abuse 10/09/2012  . Hypertensive heart disease 08/31/2012  . Accelerated hypertension 08/31/2012  . Precordial pain 08/31/2012    Past Surgical History:  Procedure Laterality Date  . CLOSED REDUCTION MANDIBULAR FRACTURE W/ ARCH BARS     + multiple extractions  . Condyloma resection    . MULTIPLE EXTRACTIONS WITH ALVEOLOPLASTY Bilateral 01/23/2018   Procedure: DENTAL EXTRACTION OF TEETH NUMBER ONE, TWO, THREE, FOUR, FIVE, SIX, SEVEN, EIGHT, NINE, TEN, ELEVEN, TWELVE, THIRTEEN, FOURTEEN, FIFTEEN, SIXTEEN, SEVENTEEN, TWENTY, TWENTY-ONE, TWENTY-TWO, TWENTY-THREE, TWENTY-FOUR, TWENTY-FIVE, TWENTY-SIX, TWENTY-SEVEN, TWENTY-EIGHT, TWENTY-NINE, THIRTY-TWO WITH ALVEOLOPLASTY;  Surgeon: Diona Browner, DDS;  Location: [place];  Service: Oral Surgery;  Lat  . RADIOLOGY WITH ANESTHESIA N/A 11/18/2015   Procedure: RADIOLOGY WITH ANESTHESIA;  Surgeon: Luanne Bras, MD;  Location: [place];  Service: Radiology;  Laterality: N/A;  . Removal foreign body right shoulder    . Right rotator cuff repair    . TEE WITHOUT CARDIOVERSION N/A 11/21/2015   Procedure: TRANSESOPHAGEAL ECHOCARDIOGRAM (TEE);  Surgeon: Larey Dresser, MD;  Location: [place];  Service: Cardiovascular;  Laterality:  N/A;  . TOOTH EXTRACTION N/A 01/24/2018   Procedure: SUTURE ORAL WOUND;  Surgeon: Diona Browner, DDS;  Location: [place];  Service: Oral Surgery;  Laterality: N/A;        Home Medications    Prior to Admission medications   Medication Sig Start Date End Date Taking? Authorizing Provider  amLODipine (NORVASC) 10 MG tablet Take 1 tablet (10 mg total) by mouth daily. 01/19/16   Kirsteins, [name], MD  amoxicillin (AMOXIL)  500 MG capsule Take 1 capsule (500 mg total) by mouth 3 (three) times daily. Patient not taking: Reported on 03/22/2018 01/23/18   Diona Browner, DDS  aspirin 325 MG tablet Take 1 tablet (325 mg total) by mouth daily. 12/20/15   Love, Ivan Anchors, PA-C  atorvastatin (LIPITOR) 40 MG tablet Take 1 tablet (40 mg total) by mouth daily at 6 PM. Patient taking differently: Take 80 mg by mouth daily at 6 PM.  01/19/16   Kirsteins, [name], MD  blood glucose meter kit and supplies KIT Dispense based on patient and insurance preference. Use up to four times daily as directed. (FOR ICD-9 250.00, 250.01). 03/18/17   Forde Dandy, MD  canagliflozin (INVOKANA) 300 MG TABS tablet Take 300 mg by mouth daily.    [provider]  cloNIDine (CATAPRES - DOSED IN MG/24 HR) 0.3 mg/24hr patch Place 1 patch (0.3 mg total) onto the skin once a week. Next change on monday Patient taking differently: Place 0.3 mg onto the skin every Wednesday. Next change on monday 12/20/15   Love, Ivan Anchors, PA-C  cyclobenzaprine (FLEXERIL) 10 MG tablet Take 1 tablet (10 mg total) by mouth 3 (three) times daily as needed. 07/06/18   Emiya Loomer, PA-C  diphenhydramine-acetaminophen (TYLENOL PM) 25-500 MG TABS tablet Take 1-2 tablets by mouth at bedtime as needed (for sleep). Depends on insomnia if takes 1-2 tablets    [provider]  empagliflozin (JARDIANCE) 25 MG TABS tablet Take 25 mg by mouth daily.     [provider]  escitalopram (LEXAPRO) 10 MG tablet Take 10 mg by mouth daily.    [provider]  folic acid (FOLVITE) 1 MG tablet Take 1 tablet (1 mg total) by mouth daily. 12/20/15   Bary Leriche, PA-C  glucose blood test strip Use as instructed 03/18/17   Forde Dandy, MD  HYDROcodone-acetaminophen (NORCO/VICODIN) 5-325 MG tablet Take one tab po q 4 hrs prn pain 07/06/18   Alexxia Stankiewicz, PA-C  labetalol (NORMODYNE) 200 MG tablet Take 1 tablet (200 mg total) by mouth 3 (three) times daily. 01/19/16    Kirsteins, [name], MD  Magnesium Hydroxide (MAGNESIA PO) Take 1 tablet by mouth daily.    [provider]  metFORMIN (GLUCOPHAGE) 500 MG tablet Take 1 tablet (500 mg total) by mouth 2 (two) times daily with a meal. 03/18/17   Forde Dandy, MD  metFORMIN (GLUCOPHAGE) 500 MG tablet Take 1 tablet (500 mg total) by mouth 2 (two) times daily with a meal. 03/22/18   Milton Ferguson, MD  Multiple Vitamin (MULTIVITAMIN WITH MINERALS) TABS tablet Take 1 tablet by mouth daily. 12/20/15   Love, Ivan Anchors, PA-C  pantoprazole (PROTONIX) 40 MG tablet Take 1 tablet (40 mg total) by mouth at bedtime. 01/19/16   Kirsteins, [name], MD  tamsulosin (FLOMAX) 0.4 MG CAPS capsule Take 0.4 mg by mouth daily.    [provider]  tiZANidine (ZANAFLEX) 4 MG tablet TAKE (2) TABLETS BY MOUTH AT BEDTIME. Patient  taking differently: TAKE 2 TABLETS (33m) BY MOUTH AT BEDTIME. 06/13/16   Kirsteins, ALuanna Salk MD  traMADol (ULTRAM) 50 MG tablet Take 1 tablet (50 mg total) by mouth every 6 (six) hours as needed. 03/22/18   ZMilton Ferguson MD  traZODone (DESYREL) 100 MG tablet Take 1 tablet (100 mg total) by mouth at bedtime. 01/19/16   Kirsteins, ALuanna Salk MD  varenicline (CHANTIX PAK) 0.5 MG X 11 & 1 MG X 42 tablet Take 1 mg by mouth 2 (two) times daily. Take one 0.5 mg tablet by mouth once daily for 3 days, then increase to one 0.5 mg tablet twice daily for 4 days, then increase to one 1 mg tablet twice daily.    [provider]  vitamin C (ASCORBIC ACID) 500 MG tablet Take 500 mg by mouth daily.    [provider]    Family History Family History  Problem Relation Age of Onset  . Diabetes Mother   . Hypertension Mother   . Drug abuse Mother   . Hyperlipidemia Mother   . Diabetes Father   . Hypertension Father   . Hyperlipidemia Father   . Diabetes Brother   . Hypertension Brother     Social History Social History   Tobacco Use  . Smoking status: Current Every Day Smoker    Packs/day:  0.50    Years: 30.00    Pack years: 15.00    Types: Cigarettes    Last attempt to quit: 11/17/2015    Years since quitting: 2.6  . Smokeless tobacco: Never Used  Substance Use Topics  . Alcohol use: No  . Drug use: Yes    Types: Marijuana     Allergies   Oxcarbazepine   Review of Systems Review of Systems  Constitutional: Negative for chills and fever.  Eyes: Negative for visual disturbance.  Respiratory: Negative for chest tightness and shortness of breath.   Cardiovascular: Negative for chest pain.  Gastrointestinal: Negative for abdominal pain, nausea and vomiting.  Genitourinary: Negative for difficulty urinating and dysuria.  Musculoskeletal: Positive for arthralgias (left shoulder, hip and knee pain) and neck pain. Negative for joint swelling.  Skin: Negative for color change and wound.  Neurological: Negative for dizziness, weakness, numbness and headaches.     Physical Exam Updated Vital Signs BP (S) (!) 164/103 (BP Location: Right Arm) Comment: Pt states he hasnt taken his night time meds for BP, states he will take when he gets home.   Pulse 79   Temp 98 F (36.7 C) (Oral)   Resp 17   Ht _0  (1.651 m)   Wt 99.8 kg (220 lb)   SpO2 99%   BMI 36.61 kg/m   Physical Exam  Constitutional: He is oriented to person, place, and time. He appears well-developed and well-nourished. No distress.  HENT:  Head: Normocephalic and atraumatic.  Neck: Normal range of motion and phonation normal. Muscular tenderness present. Normal range of motion present.  ttp of the left cervical paraspinal and trapezius muscles.  Mild tenderness of the lower cervical spine.  No bony step offs.    Cardiovascular: Normal rate, regular rhythm and intact distal pulses.  DP pulses are strong and palpable bilaterally  Pulmonary/Chest: Effort normal and breath sounds normal. No respiratory distress. He exhibits no tenderness.  No abrasions or chest wall tenderness  Abdominal: Soft. He  exhibits no distension. There is no tenderness.  Musculoskeletal: He exhibits tenderness. He exhibits no edema or deformity.  Lumbar back: He exhibits tenderness and pain. He exhibits normal range of motion, no swelling, no deformity, no laceration and normal pulse.  Pt has full passive ROM of the left shoulder, hip and knee w/o tenderness, no edema, no bony deformity. Mild ttp of the left lower lumbar paraspinal muscles.    Neurological: He is alert and oriented to person, place, and time. No sensory deficit.  Left upper and lower extremity weakness at baseline.    Skin: Skin is warm and dry. Capillary refill takes less than 2 seconds. No rash noted.  Nursing note and vitals reviewed.    ED Treatments / Results  Labs (all labs ordered are listed, but only abnormal results are displayed) Labs Reviewed - No data to display  EKG None  Radiology Dg Cervical Spine Complete  Result Date: 07/06/2018 CLINICAL DATA:  Fall pain on the left side EXAM: CERVICAL SPINE - COMPLETE 4+ VIEW COMPARISON:  CT 03/22/2018 FINDINGS: Suboptimal visualization C6 and below. Os odontoideum versus ununited odontoid fracture. Slight lateral overhang of the right lateral mass. Carotid vascular calcification. Normal prevertebral soft tissue thickness. Mild degenerative changes C4-C5 and C5-C6. IMPRESSION: 1. Inadequate evaluation of C6 and below. Additional finding of mild rightward positioning of the C1 lateral masses with respect to C2. Further evaluation with cervical spine CT is suggested. 2. Appearance of os odontoideum versus ununited odontoid fracture as seen on prior CT Electronically Signed   By: Donavan Foil M.D.   On: 07/06/2018 20:08   Dg Lumbar Spine Complete  Result Date: 07/06/2018 CLINICAL DATA:  Fall with back pain EXAM: LUMBAR SPINE - COMPLETE 4+ VIEW COMPARISON:  None. FINDINGS: Lumbar alignment within normal limits. Vertebral body heights are maintained. Mild degenerative changes at L2-L3. Mild  to moderate degenerative change at L5-S1. Aortic atherosclerosis IMPRESSION: Degenerative changes.  No acute osseous abnormality Electronically Signed   By: Donavan Foil M.D.   On: 07/06/2018 20:11   Ct Cervical Spine Wo Contrast  Result Date: 07/06/2018 CLINICAL DATA:  [place] getting out of pool. Patient has chronic lower extremity weakness. Current symptoms are pain on the LEFT side. EXAM: CT CERVICAL SPINE WITHOUT CONTRAST TECHNIQUE: Multidetector CT imaging of the cervical spine was performed without intravenous contrast. Multiplanar CT image reconstructions were also generated. COMPARISON:  Plain films 07/06/2018. MRI of the cervical spine 12/16/2008. Most recent prior CT 03/22/2018. FINDINGS: Alignment: Rounded dens fragment, representing os odontoideum, or ununited odontoid type II fracture, remains superiorly and posteriorly displaced, unchanged from 2010. There is a stable relationship with the anterior arch of C1. Otherwise anatomic. Skull base and vertebrae: As mentioned above, os odontoideum versus ununited type II fracture. This appears similar over the multiple prior imaging studies. No acute fracture, evidence of diskitis, or worrisome osseous lesion. Soft tissues and spinal canal: Mild pannus posterior to the body of C2, projects into the spinal canal, stable from prior imaging. Directly opposite this, the cord displays myelomalacia, better demonstrated on MR. No intraspinal hematoma. Carotid atherosclerosis. Disc levels:  Mild multilevel disc space narrowing. Upper chest: Unremarkable. Other: None. 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 No acute fracture, increased displacement/traumatic subluxation, or significant worsening of spinal stenosis at the C2 level. Elsewhere in the cervical spine, normal alignment, without  cervical spine fracture or significant stenosis/foraminal narrowing. Electronically Signed   By: Staci Righter M.D.   On:  07/06/2018 21:40   Dg Shoulder Left  Result Date: 07/06/2018 CLINICAL DATA:  Fall with shoulder pain EXAM: LEFT SHOULDER - 2+ VIEW COMPARISON:  03/22/2018 FINDINGS: No fracture or malalignment. AC joint shows degenerative change. The left lung apex is clear IMPRESSION: No acute osseous abnormality Electronically Signed   By: Donavan Foil M.D.   On: 07/06/2018 20:09   Dg Hip Unilat W Or Wo Pelvis 2-3 Views Left  Result Date: 07/06/2018 CLINICAL DATA:  Fall with hip pain EXAM: DG HIP (WITH OR WITHOUT PELVIS) 2-3V LEFT COMPARISON:  None. FINDINGS: SI joints are patent. Pubic symphysis and rami are intact. No fracture or malalignment. Joint space is maintained. IMPRESSION: No acute osseous abnormality Electronically Signed   By: Donavan Foil M.D.   On: 07/06/2018 20:10     Procedures Procedures (including critical care time)  Medications Ordered in ED Medications  oxyCODONE-acetaminophen (PERCOCET/ROXICET) 5-325 MG per tablet 1 tablet (1 tablet Oral Given 07/06/18 1915)     Initial Impression / Assessment and Plan / ED Course  I have reviewed the triage vital signs and the nursing notes.  Pertinent labs & imaging results that were available during my care of the patient were reviewed by me and considered in my medical decision making (see chart for details).     XR's neg for fx.  Deformity on C spine further evaluated on CT with no acute changes or fx's.  Pt reports feeling better and ready for d/c.  Agrees to close PCP f/u.  Return precautions discussed.     Final Clinical Impressions(s) / ED Diagnoses   Final diagnoses:  Fall, initial encounter    ED Discharge Orders        Ordered    cyclobenzaprine (FLEXERIL) 10 MG tablet  3 times daily PRN     07/06/18 2155    HYDROcodone-acetaminophen (NORCO/VICODIN) 5-325 MG tablet     07/06/18 2155         Kem Parkinson, PA-C 07/08/18 2229    Dorie Rank, MD 07/09/18 [phone]

## 2018-12-21 ENCOUNTER — Emergency Department (HOSPITAL_COMMUNITY)
Admission: EM | Admit: 2018-12-21 | Discharge: 2018-12-21 | Discharge status: Against Medical Advice | Payer: Medicare Other | Attending: Emergency Medicine | Admitting: Emergency Medicine

## 2018-12-21 ENCOUNTER — Encounter (HOSPITAL_COMMUNITY): Payer: Self-pay | Admitting: Emergency Medicine

## 2018-12-21 DIAGNOSIS — Z5321 Procedure and treatment not carried out due to patient leaving prior to being seen by health care provider: Secondary | ICD-10-CM | POA: Insufficient documentation

## 2018-12-21 DIAGNOSIS — M25512 Pain in left shoulder: Secondary | ICD-10-CM | POA: Diagnosis not present

## 2018-12-21 NOTE — ED Triage Notes (Signed)
Pt also states he has been having swelling in his lower extremities x 6 months with no shortness of breath, but his feet ache.

## 2018-12-21 NOTE — ED Triage Notes (Signed)
Pt states he thinks he has pulled a muscle in his left shoulder because he has had pain ever since he got his new van without power steering.

## 2021-05-14 ENCOUNTER — Emergency Department (HOSPITAL_COMMUNITY)
Admission: EM | Admit: 2021-05-14 | Discharge: 2021-05-14 | Disposition: A | Discharge status: Home / Self Care | Payer: Medicare Other

## 2021-06-25 ENCOUNTER — Telehealth (INDEPENDENT_AMBULATORY_CARE_PROVIDER_SITE_OTHER): Payer: Self-pay

## 2021-06-25 ENCOUNTER — Other Ambulatory Visit (INDEPENDENT_AMBULATORY_CARE_PROVIDER_SITE_OTHER): Payer: Self-pay | Admitting: Internal Medicine

## 2021-06-25 NOTE — Telephone Encounter (Signed)
Correct, we cannot accept this patient.

## 2021-06-25 NOTE — Telephone Encounter (Signed)
Patients wife called and is requesting if we are accepting new patients. This patient was seeing Dr. Cindie Laroche. She did state that he is on pain medication daily and I let her know our policy and she wanted me to ask you anyway. Please advise if can accept as a new patient.

## 2021-06-27 NOTE — Telephone Encounter (Signed)
Called patient and not able to leave a detailed voice message due to no voice mail. I will try back another time.

## 2021-08-14 ENCOUNTER — Ambulatory Visit: Payer: Medicare Other | Admitting: Nurse Practitioner

## 2022-04-11 ENCOUNTER — Encounter (HOSPITAL_COMMUNITY): Payer: Self-pay

## 2022-04-11 ENCOUNTER — Emergency Department (HOSPITAL_COMMUNITY)
Admission: EM | Admit: 2022-04-11 | Discharge: 2022-04-11 | Disposition: A | Discharge status: Home / Self Care | Payer: Medicare Other | Attending: Emergency Medicine | Admitting: Emergency Medicine

## 2022-04-11 ENCOUNTER — Other Ambulatory Visit: Payer: Self-pay

## 2022-04-11 DIAGNOSIS — F17200 Nicotine dependence, unspecified, uncomplicated: Secondary | ICD-10-CM | POA: Insufficient documentation

## 2022-04-11 DIAGNOSIS — I10 Essential (primary) hypertension: Secondary | ICD-10-CM | POA: Diagnosis not present

## 2022-04-11 DIAGNOSIS — R1012 Left upper quadrant pain: Secondary | ICD-10-CM | POA: Diagnosis not present

## 2022-04-11 DIAGNOSIS — E119 Type 2 diabetes mellitus without complications: Secondary | ICD-10-CM | POA: Diagnosis not present

## 2022-04-11 DIAGNOSIS — Z79899 Other long term (current) drug therapy: Secondary | ICD-10-CM | POA: Diagnosis not present

## 2022-04-11 DIAGNOSIS — Z7984 Long term (current) use of oral hypoglycemic drugs: Secondary | ICD-10-CM | POA: Insufficient documentation

## 2022-04-11 DIAGNOSIS — R300 Dysuria: Secondary | ICD-10-CM | POA: Insufficient documentation

## 2022-04-11 DIAGNOSIS — Z7982 Long term (current) use of aspirin: Secondary | ICD-10-CM | POA: Diagnosis not present

## 2022-04-11 DIAGNOSIS — R319 Hematuria, unspecified: Secondary | ICD-10-CM | POA: Insufficient documentation

## 2022-04-11 LAB — URINALYSIS, ROUTINE W REFLEX MICROSCOPIC
Bacteria, UA: NONE SEEN
Bilirubin Urine: NEGATIVE
Glucose, UA: >=500 mg/dL — AB
Hgb urine dipstick: NEGATIVE
Ketones, ur: NEGATIVE mg/dL
Leukocytes,Ua: NEGATIVE
Nitrite: POSITIVE — AB
Protein, ur: 100 mg/dL — AB
Specific Gravity, Urine: 1.02 (ref 1.005–1.030)
pH: 6 (ref 5.0–8.0)

## 2022-04-11 LAB — CBC WITH DIFFERENTIAL/PLATELET
Abs Immature Granulocytes: 0.03 10*3/uL (ref 0.00–0.07)
Basophils Absolute: 0 10*3/uL (ref 0.0–0.1)
Basophils Relative: 0 %
Eosinophils Absolute: 0.2 10*3/uL (ref 0.0–0.5)
Eosinophils Relative: 2 %
HCT: 42.3 % (ref 39.0–52.0)
Hemoglobin: 14.2 g/dL (ref 13.0–17.0)
Immature Granulocytes: 0 %
Lymphocytes Relative: 17 %
Lymphs Abs: 1.4 10*3/uL (ref 0.7–4.0)
MCH: 32.8 pg (ref 26.0–34.0)
MCHC: 33.6 g/dL (ref 30.0–36.0)
MCV: 97.7 fL (ref 80.0–100.0)
Monocytes Absolute: 0.6 10*3/uL (ref 0.1–1.0)
Monocytes Relative: 8 %
Neutro Abs: 5.8 10*3/uL (ref 1.7–7.7)
Neutrophils Relative %: 73 %
Platelets: 312 10*3/uL (ref 150–400)
RBC: 4.33 MIL/uL (ref 4.22–5.81)
RDW: 13.7 % (ref 11.5–15.5)
WBC: 8.1 10*3/uL (ref 4.0–10.5)
nRBC: 0 % (ref 0.0–0.2)

## 2022-04-11 LAB — COMPREHENSIVE METABOLIC PANEL
ALT: 37 U/L (ref 0–44)
AST: 42 U/L — ABNORMAL HIGH (ref 15–41)
Albumin: 4.1 g/dL (ref 3.5–5.0)
Alkaline Phosphatase: 34 U/L — ABNORMAL LOW (ref 38–126)
Anion gap: 10 (ref 5–15)
BUN: 36 mg/dL — ABNORMAL HIGH (ref 6–20)
CO2: 27 mmol/L (ref 22–32)
Calcium: 9.5 mg/dL (ref 8.9–10.3)
Chloride: 99 mmol/L (ref 98–111)
Creatinine, Ser: 2.44 mg/dL — ABNORMAL HIGH (ref 0.61–1.24)
GFR, Estimated: 31 mL/min — ABNORMAL LOW (ref >60)
Glucose, Bld: 117 mg/dL — ABNORMAL HIGH (ref 70–99)
Potassium: 4.2 mmol/L (ref 3.5–5.1)
Sodium: 136 mmol/L (ref 135–145)
Total Bilirubin: 0.9 mg/dL (ref 0.3–1.2)
Total Protein: 8.5 g/dL — ABNORMAL HIGH (ref 6.5–8.1)

## 2022-04-11 LAB — LIPASE, BLOOD: Lipase: 72 U/L — ABNORMAL HIGH (ref 11–51)

## 2022-04-11 NOTE — ED Notes (Signed)
Discharge done by Fabio Neighbors, RN ?

## 2022-04-11 NOTE — ED Notes (Signed)
+   burning with urination per pt ?Denies any fevers or N/V ?

## 2022-04-11 NOTE — Discharge Instructions (Addendum)
Please follow-up with a PCP/primary care provider within the next 3 to 5 days.  A list of resources has been provided for you in your discharge paperwork which includes primary care providers. ? ?Continue taking your medications as prescribed until you are able to follow-up with a primary care. ? ?Return to the ED for new or worsening symptoms as discussed. ? ? ?If you do not have a doctor see the list below. ? ?RESOURCE GUIDE ? ?Chronic Pain Problems: ?Contact [facility]  [phone] ?Patients need to be referred by their primary care doctor. ? ?Insufficient Money for Medicine: ?Contact [employer] Way:  call "211" or Reserve [phone]. ? ?No Primary Care Doctor: ?Call [facility]  [phone] - can help you locate a primary care doctor that  accepts your insurance, provides certain services, etc. ?Physician Referral Service- [phone] ?Agencies that provide inexpensive medical care: ?[facility]  [phone] ?[facility]  [phone] ?[facility]  [phone] ?[facility]  [phone] ?[facility] Parenthood  [phone] ?[facility]  [phone] ? ?[place] Providers: ?[facility]- [address] ? [phone], Mon-Fri 9am-7pm, Sat 9am-1pm ?[place], [place] [address] ? [phone] ?[place], [address] ? [phone] ?[place]- [address] ? [phone] ?Lucianne Lei- [address], [address], [phone] ? Only accepts [place] patients after they have their name  applied to their card ? ?Self Pay (no insurance) in [facility]: ?Sickle Cell Patients: Dr Kevan Ny, [facility] ? [address], [place] ?[facility] [place] ? [facility] [place]- [address], [facility]- see information above (Speak to [employer] if you do not have insurance) ?      -  [facility]- [place], [place]- [place],  [place] ?      -  [place], [place] ?      -  Dr Vista Lawman-  [address], [place], [place] ?      -  [facility]- [address], [phone] ?      -  [place], [place], also [address] [phone] ?      -    [facility]- [address], [place], 1st & 3rd Saturday   every month, 10am-1pm ? ?1) Find a Doctor and Pay Out of Pocket ?Although you won't have to find out who is covered by your insurance plan, it is a good idea to ask around and get recommendations. You will then need to call the office and see if the doctor you have chosen will accept you as a new patient and what types of options they offer for patients who are self-pay. Some doctors offer discounts or will set up payment plans for their patients who do not have insurance, but you will need to ask so you aren't surprised when you get to your appointment. ? ?2) [place] Department ?Not all health departments  have doctors that can see patients for sick visits, but many do, so it is worth a call to see if yours does. If you don't know where your local health department is, you can check in your phone book. The CDC also has a tool to help you locate your state's health department, and many state websites also have listings of all of their local health departments. ? ?3) Find a [place] [facility] ?If your illness is not likely to be very severe or complicated, you may want to try a walk in clinic. These are popping up all over the country in pharmacies, drugstores, and shopping centers. They're usually staffed by nurse practitioners or physician assistants that have been trained to treat common illnesses and complaints.  They're usually fairly quick and inexpensive. However, if you have serious medical issues or chronic medical problems, these are probably not your best option ? ?STD Testing ?[place], [facility], [address] [place], phone [phone] or [phone].  Monday - Friday, call for an appointment. ?[place], [facility] Clinic, [address], [place], phone [phone] or [phone].  Monday - Friday, call for an appointment. ? ?Abuse/Neglect: ?[place] [phone] ?[place] [phone] (After Hours) ? ?Emergency Shelter:  [facility] [phone] ? ?Maternity Homes: ?Room at the [place] [phone] ?[place] [phone] ? ?MRSA Hotline #:   160-7371 ? ?[place] ? ?[facility] ?[address]  ?[place] ?Phone:  [phone]                                   Phone:  [phone]                 Phone:  [phone] ? ?[facility], [phone] ?[place]- [phone] ?      -     [facility] in [place], [address]                                  [phone], Insurance ? ?[place] ?[phone] or [phone] (After Hours) ? ?Behavioral Health Services ? ?Substance Abuse Resources: ?[facility]  [phone] ?[facility] [phone] ?[facility] [phone] ?[facility] [phone] ?Residential & Outpatient Substance Abuse Program  [phone] ? ?Psychological Services: ?  [place]  [phone] ?[employer]  [phone] ?[facility], [place] [address] [place], [place]: [phone] or [phone], [employer]uy ? ?Dental Assistance ? ?Patients with Medicaid: ?[place] ?[place] ?Phone:  [phone]                                                  Phone:  [phone] ? ?If unable to pay or uninsured, contact:  [place] or [facility] to become qualified for the adult dental clinic. ? ?Patients with Medicaid: [place] ?[place], [phone] ?[place] [address], [place] ? ?If unable to pay, or uninsured, contact [facility] [phone]) or [place] [phone] in [place], [place] in [facility]) to become qualified for the adult dental clinic ? ?Other [place]: ?[facility]- [place], [place], [place], [address], [place], [place], 2nd and 4th Thursday of the month at 6:30am.  10 clients each day by appointment, can sometimes see walk-in patients if someone does not show for an appointment. ?[facility]- [address] [place], [place], [address], [phone] ?[place], [place], [place], [address], [place] ?[place] ?[facility]- [phone] ?[place] ? ?

## 2022-04-11 NOTE — ED Provider Notes (Signed)
?[facility] ?Provider Note ? ? ?CSN: [id number] ?Arrival date & time: 04/11/22  0955 ? ?  ? ?History ? ?Chief Complaint  ?Patient presents with  ? Hematuria  ? ? ?Taylor Obrien is a 56 y.o. male with chief complaint of left-sided abdominal pain and dysuria over the last 3 weeks.  Began noticing color change to his urine over the last 5 to 7 days.  No prior history of UTI or kidney stones.  Denies flank pain, fever, nausea, vomiting.  Describes the pain as constant, and made worse with pressure.  Also notes increased urinary frequency and dark-colored urine.  The urine color is what concerned him, so he came to the emergency department.  Notes some pelvic pain, but states a large amount of pain is the left upper quadrant as well.  Hx of stroke in 2016/2018 that is caused an abnormal gait.  Usually wheelchair-bound.  Notes diarrhea that started 1 week ago and resolved completely 2 days ago, patient is not concerned about the diarrhea.  Denies constipation. ? ?The history is provided by the patient and medical records.  ?Hematuria ? ? ?  ? ?Home Medications ?Prior to Admission medications   ?Medication Sig Start Date End Date Taking? Authorizing Provider  ?amLODipine (NORVASC) 10 MG tablet Take 1 tablet (10 mg total) by mouth daily. 01/19/16   Kirsteins, [name], MD  ?amoxicillin (AMOXIL) 500 MG capsule Take 1 capsule (500 mg total) by mouth 3 (three) times daily. ?Patient not taking: Reported on 03/22/2018 01/23/18   Diona Browner, DMD  ?aspirin 325 MG tablet Take 1 tablet (325 mg total) by mouth daily. 12/20/15   Love, Ivan Anchors, PA-C  ?atorvastatin (LIPITOR) 40 MG tablet Take 1 tablet (40 mg total) by mouth daily at 6 PM. ?Patient taking differently: Take 80 mg by mouth daily at 6 PM.  01/19/16   Kirsteins, [name], MD  ?blood glucose meter kit and supplies KIT Dispense based on patient and insurance preference. Use up to four times daily as directed. (FOR ICD-9 250.00, 250.01). 03/18/17   Forde Dandy, MD  ?canagliflozin (INVOKANA) 300 MG TABS tablet Take 300 mg by mouth daily.    [provider]  ?cloNIDine (CATAPRES - DOSED IN MG/24 HR) 0.3 mg/24hr patch Place 1 patch (0.3 mg total) onto the skin once a week. Next change on monday ?Patient taking differently: Place 0.3 mg onto the skin every Wednesday. Next change on monday 12/20/15   Love, Ivan Anchors, PA-C  ?cyclobenzaprine (FLEXERIL) 10 MG tablet Take 1 tablet (10 mg total) by mouth 3 (three) times daily as needed. 07/06/18   Triplett, Tammy, PA-C  ?diphenhydramine-acetaminophen (TYLENOL PM) 25-500 MG TABS tablet Take 1-2 tablets by mouth at bedtime as needed (for sleep). Depends on insomnia if takes 1-2 tablets    [provider]  ?empagliflozin (JARDIANCE) 25 MG TABS tablet Take 25 mg by mouth daily.     [provider]  ?escitalopram (LEXAPRO) 10 MG tablet Take 10 mg by mouth daily.    [provider]  ?folic acid (FOLVITE) 1 MG tablet Take 1 tablet (1 mg total) by mouth daily. 12/20/15   Bary Leriche, PA-C  ?glucose blood test strip Use as instructed 03/18/17   Forde Dandy, MD  ?HYDROcodone-acetaminophen (NORCO/VICODIN) 5-325 MG tablet Take one tab po q 4 hrs prn pain 07/06/18   Triplett, Tammy, PA-C  ?labetalol (NORMODYNE) 200 MG tablet Take 1 tablet (200 mg total) by mouth 3 (  three) times daily. 01/19/16   Kirsteins, [name], MD  ?Magnesium Hydroxide (MAGNESIA PO) Take 1 tablet by mouth daily.    [provider]  ?metFORMIN (GLUCOPHAGE) 500 MG tablet Take 1 tablet (500 mg total) by mouth 2 (two) times daily with a meal. 03/18/17   Forde Dandy, MD  ?metFORMIN (GLUCOPHAGE) 500 MG tablet Take 1 tablet (500 mg total) by mouth 2 (two) times daily with a meal. 03/22/18   Milton Ferguson, MD  ?Multiple Vitamin (MULTIVITAMIN WITH MINERALS) TABS tablet Take 1 tablet by mouth daily. 12/20/15   Love, Ivan Anchors, PA-C  ?pantoprazole (PROTONIX) 40 MG tablet Take 1 tablet (40 mg total) by mouth at bedtime. 01/19/16    Kirsteins, [name], MD  ?tamsulosin (FLOMAX) 0.4 MG CAPS capsule Take 0.4 mg by mouth daily.    [provider]  ?tiZANidine (ZANAFLEX) 4 MG tablet TAKE (2) TABLETS BY MOUTH AT BEDTIME. ?Patient taking differently: TAKE 2 TABLETS (66m) BY MOUTH AT BEDTIME. 06/13/16   Kirsteins, ALuanna Salk MD  ?traMADol (ULTRAM) 50 MG tablet Take 1 tablet (50 mg total) by mouth every 6 (six) hours as needed. 03/22/18   ZMilton Ferguson MD  ?traZODone (DESYREL) 100 MG tablet Take 1 tablet (100 mg total) by mouth at bedtime. 01/19/16   Kirsteins, ALuanna Salk MD  ?varenicline (CHANTIX PAK) 0.5 MG X 11 & 1 MG X 42 tablet Take 1 mg by mouth 2 (two) times daily. Take one 0.5 mg tablet by mouth once daily for 3 days, then increase to one 0.5 mg tablet twice daily for 4 days, then increase to one 1 mg tablet twice daily.    [provider]  ?vitamin C (ASCORBIC ACID) 500 MG tablet Take 500 mg by mouth daily.    [provider]  ?   ? ?Allergies    ?Oxcarbazepine   ? ?Review of Systems   ?Review of Systems  ?Genitourinary:  Positive for dysuria, frequency and hematuria.  ? ?Physical Exam ?Updated Vital Signs ?BP (!) 164/111   Pulse 80   Temp 98.4 ?F (36.9 ?C) (Oral)   Resp 18   Ht _0  (1.651 m)   Wt 95.3 kg   SpO2 100%   BMI 34.95 kg/m?  ?Physical Exam ?Vitals and nursing note reviewed. Exam conducted with a chaperone present.  ?Constitutional:   ?   General: He is not in acute distress. ?   Appearance: He is well-developed.  ?HENT:  ?   Head: Normocephalic and atraumatic.  ?   Mouth/Throat:  ?   Mouth: Mucous membranes are moist.  ?   Pharynx: Oropharynx is clear.  ?Eyes:  ?   Conjunctiva/sclera: Conjunctivae normal.  ?Cardiovascular:  ?   Rate and Rhythm: Normal rate and regular rhythm.  ?   Pulses: Normal pulses.  ?   Heart sounds: Normal heart sounds. No murmur heard. ?Pulmonary:  ?   Effort: Pulmonary effort is normal. No respiratory distress.  ?   Breath sounds: Normal breath sounds.  ?Abdominal:  ?    General: Abdomen is protuberant. Bowel sounds are normal.  ?   Palpations: Abdomen is soft.  ?   Tenderness: There is abdominal tenderness in the suprapubic area and left upper quadrant. There is no right CVA tenderness, left CVA tenderness or guarding. Negative signs include Murphy's sign and McBurney's sign.  ? ? ?   Comments: Tenderness as indicated above.  No obvious mass, deformity, ecchymosis, rash.  ?Genitourinary: ?   Penis: Normal.  No erythema, tenderness, discharge, swelling or lesions.   ?   Testes: Normal.     ?   Right: Mass, tenderness or swelling not present.     ?   Left: Mass, tenderness or swelling not present.  ?   Epididymis:  ?   Right: Normal. Not inflamed or enlarged. No tenderness.  ?   Left: Normal. Not inflamed or enlarged. No tenderness.  ?   Prostate: Normal. Not enlarged, not tender and no nodules present.  ?   Rectum: No mass, tenderness or external hemorrhoid. Normal anal tone.  ?Musculoskeletal:     ?   General: No swelling.  ?   Cervical back: Neck supple.  ?Skin: ?   General: Skin is warm and dry.  ?   Capillary Refill: Capillary refill takes less than 2 seconds.  ?Neurological:  ?   Mental Status: He is alert and oriented to person, place, and time.  ?   GCS: GCS eye subscore is 4. GCS verbal subscore is 5. GCS motor subscore is 6.  ?Psychiatric:     ?   Mood and Affect: Mood normal.  ? ? ?ED Results / Procedures / Treatments   ?Labs ?(all labs ordered are listed, but only abnormal results are displayed) ?Labs Reviewed  ?URINALYSIS, ROUTINE W REFLEX MICROSCOPIC - Abnormal; Notable for the following components:  ?    Result Value  ? Color, Urine AMBER (*)   ? Glucose, UA >=500 (*)   ? Protein, ur 100 (*)   ? Nitrite POSITIVE (*)   ? All other components within normal limits  ?COMPREHENSIVE METABOLIC PANEL - Abnormal; Notable for the following components:  ? Glucose, Bld 117 (*)   ? BUN 36 (*)   ? Creatinine, Ser 2.44 (*)   ? Total Protein 8.5 (*)   ? AST 42 (*)   ? Alkaline  Phosphatase 34 (*)   ? GFR, Estimated 31 (*)   ? All other components within normal limits  ?LIPASE, BLOOD - Abnormal; Notable for the following components:  ? Lipase 72 (*)   ? All other components within normal limits  ?URINE CULT

## 2022-04-11 NOTE — ED Triage Notes (Signed)
Pt reports has been having abd pain/cramping, dark urine, and says he thinks he has blood in his urine for the past 4 or 5 days.    Says has been taking an otc pill for UTI but doesn't know what it is.   ?

## 2022-04-13 LAB — URINE CULTURE: Culture: <10000 — AB

## 2022-04-24 ENCOUNTER — Other Ambulatory Visit: Payer: Self-pay

## 2022-04-24 ENCOUNTER — Encounter (HOSPITAL_COMMUNITY): Payer: Self-pay

## 2022-04-24 ENCOUNTER — Emergency Department (HOSPITAL_COMMUNITY): Payer: Medicare Other

## 2022-04-24 ENCOUNTER — Inpatient Hospital Stay (HOSPITAL_COMMUNITY)
Admission: EM | Admit: 2022-04-24 | Discharge: 2022-04-25 | DRG: 439 | Discharge status: Against Medical Advice | Payer: Medicare Other | Attending: Internal Medicine | Admitting: Internal Medicine

## 2022-04-24 DIAGNOSIS — E119 Type 2 diabetes mellitus without complications: Secondary | ICD-10-CM

## 2022-04-24 DIAGNOSIS — N1831 Chronic kidney disease, stage 3a: Secondary | ICD-10-CM | POA: Diagnosis present

## 2022-04-24 DIAGNOSIS — K219 Gastro-esophageal reflux disease without esophagitis: Secondary | ICD-10-CM | POA: Diagnosis present

## 2022-04-24 DIAGNOSIS — E1122 Type 2 diabetes mellitus with diabetic chronic kidney disease: Secondary | ICD-10-CM | POA: Diagnosis present

## 2022-04-24 DIAGNOSIS — Z7984 Long term (current) use of oral hypoglycemic drugs: Secondary | ICD-10-CM

## 2022-04-24 DIAGNOSIS — I10 Essential (primary) hypertension: Secondary | ICD-10-CM | POA: Diagnosis present

## 2022-04-24 DIAGNOSIS — Z79899 Other long term (current) drug therapy: Secondary | ICD-10-CM

## 2022-04-24 DIAGNOSIS — E1165 Type 2 diabetes mellitus with hyperglycemia: Secondary | ICD-10-CM | POA: Diagnosis present

## 2022-04-24 DIAGNOSIS — F17210 Nicotine dependence, cigarettes, uncomplicated: Secondary | ICD-10-CM | POA: Diagnosis present

## 2022-04-24 DIAGNOSIS — E782 Mixed hyperlipidemia: Secondary | ICD-10-CM | POA: Diagnosis present

## 2022-04-24 DIAGNOSIS — K8500 Idiopathic acute pancreatitis without necrosis or infection: Secondary | ICD-10-CM

## 2022-04-24 DIAGNOSIS — I129 Hypertensive chronic kidney disease with stage 1 through stage 4 chronic kidney disease, or unspecified chronic kidney disease: Secondary | ICD-10-CM | POA: Diagnosis present

## 2022-04-24 DIAGNOSIS — Z833 Family history of diabetes mellitus: Secondary | ICD-10-CM | POA: Diagnosis not present

## 2022-04-24 DIAGNOSIS — Z8249 Family history of ischemic heart disease and other diseases of the circulatory system: Secondary | ICD-10-CM

## 2022-04-24 DIAGNOSIS — Z7982 Long term (current) use of aspirin: Secondary | ICD-10-CM | POA: Diagnosis not present

## 2022-04-24 DIAGNOSIS — F32A Depression, unspecified: Secondary | ICD-10-CM | POA: Diagnosis present

## 2022-04-24 DIAGNOSIS — Z83438 Family history of other disorder of lipoprotein metabolism and other lipidemia: Secondary | ICD-10-CM | POA: Diagnosis not present

## 2022-04-24 DIAGNOSIS — E785 Hyperlipidemia, unspecified: Secondary | ICD-10-CM | POA: Diagnosis present

## 2022-04-24 DIAGNOSIS — K859 Acute pancreatitis without necrosis or infection, unspecified: Principal | ICD-10-CM | POA: Diagnosis present

## 2022-04-24 DIAGNOSIS — Z720 Tobacco use: Secondary | ICD-10-CM | POA: Diagnosis not present

## 2022-04-24 DIAGNOSIS — N179 Acute kidney failure, unspecified: Secondary | ICD-10-CM | POA: Diagnosis present

## 2022-04-24 DIAGNOSIS — Z8673 Personal history of transient ischemic attack (TIA), and cerebral infarction without residual deficits: Secondary | ICD-10-CM

## 2022-04-24 LAB — URINALYSIS, ROUTINE W REFLEX MICROSCOPIC
Bilirubin Urine: NEGATIVE
Glucose, UA: >=500 mg/dL — AB
Hgb urine dipstick: NEGATIVE
Ketones, ur: NEGATIVE mg/dL
Leukocytes,Ua: NEGATIVE
Nitrite: NEGATIVE
Protein, ur: 100 mg/dL — AB
Specific Gravity, Urine: 1.022 (ref 1.005–1.030)
pH: 5 (ref 5.0–8.0)

## 2022-04-24 LAB — CBC WITH DIFFERENTIAL/PLATELET
Abs Immature Granulocytes: 0.07 10*3/uL (ref 0.00–0.07)
Basophils Absolute: 0 10*3/uL (ref 0.0–0.1)
Basophils Relative: 0 %
Eosinophils Absolute: 0.2 10*3/uL (ref 0.0–0.5)
Eosinophils Relative: 1 %
HCT: 43.6 % (ref 39.0–52.0)
Hemoglobin: 15.1 g/dL (ref 13.0–17.0)
Immature Granulocytes: 1 %
Lymphocytes Relative: 6 %
Lymphs Abs: 0.9 10*3/uL (ref 0.7–4.0)
MCH: 32.2 pg (ref 26.0–34.0)
MCHC: 34.6 g/dL (ref 30.0–36.0)
MCV: 93 fL (ref 80.0–100.0)
Monocytes Absolute: 1.4 10*3/uL — ABNORMAL HIGH (ref 0.1–1.0)
Monocytes Relative: 9 %
Neutro Abs: 12.8 10*3/uL — ABNORMAL HIGH (ref 1.7–7.7)
Neutrophils Relative %: 83 %
Platelets: 474 10*3/uL — ABNORMAL HIGH (ref 150–400)
RBC: 4.69 MIL/uL (ref 4.22–5.81)
RDW: 13.4 % (ref 11.5–15.5)
WBC: 15.5 10*3/uL — ABNORMAL HIGH (ref 4.0–10.5)
nRBC: 0 % (ref 0.0–0.2)

## 2022-04-24 LAB — RAPID URINE DRUG SCREEN, HOSP PERFORMED
Amphetamines: NOT DETECTED
Barbiturates: NOT DETECTED
Benzodiazepines: NOT DETECTED
Cocaine: NOT DETECTED
Opiates: POSITIVE — AB
Tetrahydrocannabinol: POSITIVE — AB

## 2022-04-24 LAB — COMPREHENSIVE METABOLIC PANEL
ALT: 36 U/L (ref 0–44)
AST: 31 U/L (ref 15–41)
Albumin: 3.6 g/dL (ref 3.5–5.0)
Alkaline Phosphatase: 41 U/L (ref 38–126)
Anion gap: 13 (ref 5–15)
BUN: 38 mg/dL — ABNORMAL HIGH (ref 6–20)
CO2: 25 mmol/L (ref 22–32)
Calcium: 9.5 mg/dL (ref 8.9–10.3)
Chloride: 95 mmol/L — ABNORMAL LOW (ref 98–111)
Creatinine, Ser: 2.26 mg/dL — ABNORMAL HIGH (ref 0.61–1.24)
GFR, Estimated: 34 mL/min — ABNORMAL LOW (ref >60)
Glucose, Bld: 186 mg/dL — ABNORMAL HIGH (ref 70–99)
Potassium: 4.7 mmol/L (ref 3.5–5.1)
Sodium: 133 mmol/L — ABNORMAL LOW (ref 135–145)
Total Bilirubin: 1.5 mg/dL — ABNORMAL HIGH (ref 0.3–1.2)
Total Protein: 8.8 g/dL — ABNORMAL HIGH (ref 6.5–8.1)

## 2022-04-24 LAB — LIPASE, BLOOD: Lipase: 522 U/L — ABNORMAL HIGH (ref 11–51)

## 2022-04-24 MED ORDER — PANTOPRAZOLE SODIUM 40 MG PO TBEC
40.0000 mg | DELAYED_RELEASE_TABLET | Freq: Every day | ORAL | Status: DC
  Administered 2022-04-24: 40 mg via ORAL
  Filled 2022-04-24: qty 1

## 2022-04-24 MED ORDER — ACETAMINOPHEN 325 MG PO TABS: 650.0000 mg | ORAL_TABLET | Freq: Four times a day (QID) | ORAL | Status: DC | PRN

## 2022-04-24 MED ORDER — PIPERACILLIN-TAZOBACTAM 3.375 G IVPB
3.3750 g | Freq: Three times a day (TID) | INTRAVENOUS | Status: DC
Start: 2022-04-24 — End: 2022-04-25
  Administered 2022-04-24 – 2022-04-25 (×2): 3.375 g via INTRAVENOUS
  Filled 2022-04-24 (×2): qty 50

## 2022-04-24 MED ORDER — TRAZODONE HCL 50 MG PO TABS
50.0000 mg | ORAL_TABLET | Freq: Every day | ORAL | Status: DC
  Administered 2022-04-24: 50 mg via ORAL
  Filled 2022-04-24: qty 1

## 2022-04-24 MED ORDER — SODIUM CHLORIDE 0.9 % IV BOLUS
1000.0000 mL | Freq: Once | INTRAVENOUS | Status: AC
  Administered 2022-04-24: 1000 mL via INTRAVENOUS

## 2022-04-24 MED ORDER — HYDROMORPHONE HCL 1 MG/ML IJ SOLN
0.5000 mg | INTRAMUSCULAR | Status: DC | PRN
  Administered 2022-04-24 – 2022-04-25 (×3): 0.5 mg via INTRAVENOUS
  Filled 2022-04-24 (×3): qty 0.5

## 2022-04-24 MED ORDER — ONDANSETRON HCL 4 MG/2ML IJ SOLN
4.0000 mg | Freq: Once | INTRAMUSCULAR | Status: AC
  Administered 2022-04-24: 4 mg via INTRAVENOUS
  Filled 2022-04-24: qty 2

## 2022-04-24 MED ORDER — FENTANYL CITRATE PF 50 MCG/ML IJ SOSY
25.0000 ug | PREFILLED_SYRINGE | INTRAMUSCULAR | Status: DC | PRN
  Administered 2022-04-24 – 2022-04-25 (×3): 25 ug via INTRAVENOUS
  Filled 2022-04-24 (×3): qty 1

## 2022-04-24 MED ORDER — HYDROMORPHONE HCL 1 MG/ML IJ SOLN
0.5000 mg | Freq: Once | INTRAMUSCULAR | Status: AC
  Administered 2022-04-24: 0.5 mg via INTRAVENOUS
  Filled 2022-04-24: qty 0.5

## 2022-04-24 MED ORDER — SODIUM CHLORIDE 0.9 % IV SOLN: INTRAVENOUS | Status: DC

## 2022-04-24 MED ORDER — ACETAMINOPHEN 650 MG RE SUPP
650.0000 mg | Freq: Four times a day (QID) | RECTAL | Status: DC | PRN
Start: 2022-04-24 — End: 2022-04-25

## 2022-04-24 MED ORDER — HEPARIN SODIUM (PORCINE) 5000 UNIT/ML IJ SOLN
5000.0000 [IU] | Freq: Three times a day (TID) | INTRAMUSCULAR | Status: DC
  Administered 2022-04-24 – 2022-04-25 (×2): 5000 [IU] via SUBCUTANEOUS
  Filled 2022-04-24 (×2): qty 1

## 2022-04-24 MED ORDER — LABETALOL HCL 200 MG PO TABS
200.0000 mg | ORAL_TABLET | Freq: Three times a day (TID) | ORAL | Status: DC
  Administered 2022-04-24 (×2): 200 mg via ORAL
  Filled 2022-04-24 (×2): qty 1

## 2022-04-24 MED ORDER — LABETALOL HCL 200 MG PO TABS
200.0000 mg | ORAL_TABLET | Freq: Two times a day (BID) | ORAL | Status: DC
  Administered 2022-04-24 – 2022-04-25 (×2): 200 mg via ORAL
  Filled 2022-04-24 (×2): qty 1

## 2022-04-24 MED ORDER — ESCITALOPRAM OXALATE 10 MG PO TABS
10.0000 mg | ORAL_TABLET | Freq: Every day | ORAL | Status: DC
  Administered 2022-04-24 – 2022-04-25 (×2): 10 mg via ORAL
  Filled 2022-04-24 (×2): qty 1

## 2022-04-24 MED ORDER — ONDANSETRON HCL 4 MG/2ML IJ SOLN: 4.0000 mg | Freq: Four times a day (QID) | INTRAMUSCULAR | Status: DC | PRN

## 2022-04-24 MED ORDER — AMLODIPINE BESYLATE 5 MG PO TABS
10.0000 mg | ORAL_TABLET | Freq: Every day | ORAL | Status: DC
  Administered 2022-04-24 – 2022-04-25 (×2): 10 mg via ORAL
  Filled 2022-04-24 (×2): qty 2

## 2022-04-24 MED ORDER — ASPIRIN 325 MG PO TABS
325.0000 mg | ORAL_TABLET | Freq: Every day | ORAL | Status: DC
  Administered 2022-04-24 – 2022-04-25 (×2): 325 mg via ORAL
  Filled 2022-04-24 (×2): qty 1

## 2022-04-24 MED ORDER — ONDANSETRON HCL 4 MG PO TABS: 4.0000 mg | ORAL_TABLET | Freq: Four times a day (QID) | ORAL | Status: DC | PRN

## 2022-04-24 MED ORDER — MORPHINE SULFATE (PF) 4 MG/ML IV SOLN
4.0000 mg | Freq: Once | INTRAVENOUS | Status: AC
  Administered 2022-04-24: 4 mg via INTRAVENOUS
  Filled 2022-04-24: qty 1

## 2022-04-24 MED ORDER — IOHEXOL 300 MG/ML  SOLN
80.0000 mL | Freq: Once | INTRAMUSCULAR | Status: AC | PRN
  Administered 2022-04-24: 80 mL via INTRAVENOUS

## 2022-04-24 MED ORDER — PNEUMOCOCCAL 20-VAL CONJ VACC 0.5 ML IM SUSY: 0.5000 mL | PREFILLED_SYRINGE | INTRAMUSCULAR | Status: DC

## 2022-04-24 MED ORDER — BOOST / RESOURCE BREEZE PO LIQD CUSTOM: 1.0000 | Freq: Three times a day (TID) | ORAL | Status: DC

## 2022-04-24 MED ORDER — CLONIDINE HCL 0.2 MG/24HR TD PTWK
0.3000 mg | MEDICATED_PATCH | TRANSDERMAL | Status: DC
  Administered 2022-04-24: 0.3 mg via TRANSDERMAL
  Filled 2022-04-24 (×2): qty 1

## 2022-04-24 MED ORDER — PIPERACILLIN-TAZOBACTAM 3.375 G IVPB 30 MIN
3.3750 g | Freq: Once | INTRAVENOUS | Status: AC
  Administered 2022-04-24: 3.375 g via INTRAVENOUS
  Filled 2022-04-24: qty 50

## 2022-04-24 NOTE — Progress Notes (Signed)
Pharmacy Antibiotic Note ? ?Taylor Obrien is a 56 y.o. male admitted on 04/24/2022 with  intra-abdominal infection .  Pharmacy has been consulted for Zosyn dosing. ? ?Plan: ?Zosyn 3.375g IV q8h (4 hour infusion). ? ?Height: '5\' 5"'$  (165.1 cm) ?Weight: 95.3 kg (210 lb) ?IBW/kg (Calculated) : 61.5 ? ?Temp (24hrs), Avg:98.7 ?F (37.1 ?C), Min:98.7 ?F (37.1 ?C), Max:98.7 ?F (37.1 ?C) ? ?Recent Labs  ?Lab 04/24/22 ?0727  ?WBC 15.5*  ?CREATININE 2.26*  ?  ?Estimated Creatinine Clearance: 40.1 mL/min (A) (by C-G formula based on SCr of 2.26 mg/dL (H)).   ? ?Allergies  ?Allergen Reactions  ? Oxcarbazepine Other (See Comments)  ?  Patient goes out of right state of mind.   ? ? ?Antimicrobials this admission: ?Zosyn 5/17 >> ? ?Microbiology results: ?5/17 BCx: pending ? ? ?Thank you for allowing pharmacy to be a part of this patient?s care. ? ?Ramond Craver ?04/24/2022 12:32 PM ? ?

## 2022-04-24 NOTE — Assessment & Plan Note (Signed)
Serum creatinine 2.26 at the time of admission ?Suspect the patient has underlying CKD with progression of his renal function ?IV fluids ?Monitor BMP ?Discontinue HCTZ and lisinopril ?

## 2022-04-24 NOTE — H&P (Signed)
?History and Physical  ? ? ?Patient: Taylor Obrien LSL:[id number] DOB: 08/07/1966 ?DOA: 04/24/2022 ?DOS: the patient was seen and examined on 04/24/2022 ?PCP: Pcp, No  ?Patient coming from: Home ? ?Chief Complaint:  ?Chief Complaint  ?Patient presents with  ? Abdominal Pain  ? ?HPI: Taylor Obrien is a 56 year old male with a history of hypertension, hyperlipidemia, diabetes mellitus, depression, GERD, tobacco abuse presenting with upper abdominal pain for at least the past 4 days.  He has a difficult historian at best.  He states that he has had this abdominal pain since he last visited the ED on 04/11/2022.  However review of the record shows that he had left lower quadrant abdominal pain at that time with some dysuria.  His urinalysis was negative for pyuria.  He was not started on any antibiotics, but subsequently he saw his PCP and was started on ciprofloxacin on 04/22/2022.  He has not had any fevers, chills, chest pain, shortness breath, headache, neck pain.  He has complained of nausea and vomiting for the past 3 days.  There is no hematemesis, diarrhea, hematochezia, melena.  He does drink alcohol rarely.  He last drank alcohol about 1 month prior to this admission.  He denies any coughing, hemoptysis, hematochezia, melena. ?In the emergency department, the patient was afebrile hemodynamically stable with oxygen saturation 95% room air.  WBC 15.5, hemoglobin 15.1, platelets 474,000.  Sodium 133, potassium 4.7, bicarbonate 25, serum creatinine 2.26.  The patient was started on IV fluids.  UA was negative for pyuria.  AST 31, ALT 36, alk phosphatase 41, total bilirubin 1.5.  CT of the abdomen and pelvis showed heterogenous enhancement of the pancreatic head with an ill-defined fluid collection consistent with acute pancreatitis with peripancreatic fluid collections.  There is no bowel obstruction. ? ?Review of Systems: As mentioned in the history of present illness. All other systems reviewed and are negative. ?Past  Medical History:  ?Diagnosis Date  ? Arthritis   ? Chronic pain   ? Depression   ? Diabetes mellitus without complication ([place])   ? Essential hypertension, benign   ? GERD (gastroesophageal reflux disease)   ? Headache   ? HTN (hypertension) 11/27/2015  ? Hyperlipidemia   ? Sleep apnea   ? cpap   ? Stroke [facility])   ? 2016  ? ?Past Surgical History:  ?Procedure Laterality Date  ? CLOSED REDUCTION MANDIBULAR FRACTURE W/ ARCH BARS    ? + multiple extractions  ? Condyloma resection    ? MULTIPLE EXTRACTIONS WITH ALVEOLOPLASTY Bilateral 01/23/2018  ? Procedure: DENTAL EXTRACTION OF TEETH NUMBER ONE, TWO, THREE, FOUR, FIVE, SIX, SEVEN, EIGHT, NINE, TEN, ELEVEN, TWELVE, THIRTEEN, FOURTEEN, FIFTEEN, SIXTEEN, SEVENTEEN, TWENTY, TWENTY-ONE, TWENTY-TWO, TWENTY-THREE, TWENTY-FOUR, TWENTY-FIVE, TWENTY-SIX, TWENTY-SEVEN, TWENTY-EIGHT, TWENTY-NINE, THIRTY-TWO WITH ALVEOLOPLASTY;  Surgeon: Diona Browner, DDS;  Location: [place];  Service: Oral Surgery;  Lat  ? RADIOLOGY WITH ANESTHESIA N/A 11/18/2015  ? Procedure: RADIOLOGY WITH ANESTHESIA;  Surgeon: Luanne Bras, MD;  Location: [place];  Service: Radiology;  Laterality: N/A;  ? Removal foreign body right shoulder    ? Right rotator cuff repair    ? TEE WITHOUT CARDIOVERSION N/A 11/21/2015  ? Procedure: TRANSESOPHAGEAL ECHOCARDIOGRAM (TEE);  Surgeon: Larey Dresser, MD;  Location: [place];  Service: Cardiovascular;  Laterality: N/A;  ? TOOTH EXTRACTION N/A 01/24/2018  ? Procedure: SUTURE ORAL WOUND;  Surgeon: Diona Browner, DDS;  Location: [place];  Service: Oral Surgery;  Laterality: N/A;  ? ?Social History:  reports that he  has been smoking cigarettes. He has a 15.00 pack-year smoking history. He has never used smokeless tobacco. He reports current drug use. Drug: Marijuana. He reports that he does not drink alcohol. ? ?Allergies  ?Allergen Reactions  ? Oxcarbazepine Other (See Comments)  ?  Patient goes out of right state of mind.   ? ? ?Family History  ?Problem Relation Age of  Onset  ? Diabetes Mother   ? Hypertension Mother   ? Drug abuse Mother   ? Hyperlipidemia Mother   ? Diabetes Father   ? Hypertension Father   ? Hyperlipidemia Father   ? Diabetes Brother   ? Hypertension Brother   ? ? ?Prior to Admission medications   ?Medication Sig Start Date End Date Taking? Authorizing Provider  ?amLODipine (NORVASC) 10 MG tablet Take 1 tablet (10 mg total) by mouth daily. 01/19/16   Kirsteins, [name], MD  ?aspirin 325 MG tablet Take 1 tablet (325 mg total) by mouth daily. 12/20/15   Love, Ivan Anchors, PA-C  ?blood glucose meter kit and supplies KIT Dispense based on patient and insurance preference. Use up to four times daily as directed. (FOR ICD-9 250.00, 250.01). 03/18/17   Forde Dandy, MD  ?cloNIDine (CATAPRES - DOSED IN MG/24 HR) 0.3 mg/24hr patch Place 1 patch (0.3 mg total) onto the skin once a week. Next change on monday ?Patient taking differently: Place 0.3 mg onto the skin every Wednesday. Next change on monday 12/20/15   Love, Ivan Anchors, PA-C  ?diphenhydramine-acetaminophen (TYLENOL PM) 25-500 MG TABS tablet Take 1-2 tablets by mouth at bedtime as needed (for sleep). Depends on insomnia if takes 1-2 tablets    [provider]  ?empagliflozin (JARDIANCE) 25 MG TABS tablet Take 25 mg by mouth daily.     [provider]  ?escitalopram (LEXAPRO) 10 MG tablet Take 10 mg by mouth daily.    [provider]  ?glucose blood test strip Use as instructed 03/18/17   Forde Dandy, MD  ?labetalol (NORMODYNE) 200 MG tablet Take 1 tablet (200 mg total) by mouth 3 (three) times daily. 01/19/16   Kirsteins, [name], MD  ?Magnesium Hydroxide (MAGNESIA PO) Take 1 tablet by mouth daily.    [provider]  ?metFORMIN (GLUCOPHAGE) 500 MG tablet Take 1 tablet (500 mg total) by mouth 2 (two) times daily with a meal. 03/18/17   Forde Dandy, MD  ?metFORMIN (GLUCOPHAGE) 500 MG tablet Take 1 tablet (500 mg total) by mouth 2 (two) times daily with a meal. 03/22/18   Milton Ferguson, MD  ?pantoprazole (PROTONIX) 40 MG tablet Take 1 tablet (40 mg total) by mouth at bedtime. 01/19/16   Kirsteins, [name], MD  ?traZODone (DESYREL) 100 MG tablet Take 1 tablet (100 mg total) by mouth at bedtime. 01/19/16   Kirsteins, [name], MD  ?varenicline (CHANTIX PAK) 0.5 MG X 11 & 1 MG X 42 tablet Take 1 mg by mouth 2 (two) times daily. Take one 0.5 mg tablet by mouth once daily for 3 days, then increase to one 0.5 mg tablet twice daily for 4 days, then increase to one 1 mg tablet twice daily.    [provider]  ? ? ?Physical Exam: ?Vitals:  ? 04/24/22 0735 04/24/22 0800 04/24/22 0830 04/24/22 0930  ?BP:  (!) 165/108 (!) 163/108 (!) 133/111  ?Pulse:  (!) 104 99 98  ?Resp:  _0 ?Temp: 98.7 ?F (37.1 ?C)     ?TempSrc: Oral     ?  SpO2:  95% 93% 91%  ?Weight:      ?Height:      ? ?GENERAL:  A&O x 3, NAD, well developed, cooperative, follows commands ?HEENT: [place]/AT, No thrush, No icterus, No oral ulcers ?Neck:  No neck mass, No meningismus, soft, supple ?CV: RRR, no S3, no S4, no rub, no JVD ?Lungs:  CTA, no wheeze, no rhonchi, good air movement ?Abd: soft/epigastric pain +BS, nondistended ?Ext: No edema, no lymphangitis, no cyanosis, no rashes ?Neuro:  CN II-XII intact, strength 4/5 in RUE, RLE, strength 4/5 [name], [name]; sensation intact bilateral; no dysmetria; babinski equivocal ? ?Data Reviewed: ?Data reviewed in history ? ?Assessment and Plan: ?* Acute pancreatitis ?Clear liquid diet for now ?Judicious opioids ?Abdominal ultrasound ?Lipid panel ?IV fluids ? ?Controlled type 2 diabetes mellitus without complication, without long-term current use of insulin (HCC) ?Holding Jardiance ?NovoLog sliding scale ?Hemoglobin A1c ? ?AKI (acute kidney injury) (HCC) ?Serum creatinine 2.26 at the time of admission ?Suspect the patient has underlying CKD with progression of his renal function ?IV fluids ?Monitor BMP ?Discontinue HCTZ and lisinopril ? ?Depression ?Continue Lexapro ? ?Essential  hypertension ?Continue labetalol, clonidine patch, amlodipine ? ?Hyperlipidemia ?Continue statin ? ?Tobacco abuse ?Tobacco cessation discussed ? ? ? ? ? Advance Care Planning: FULL ? ?Consults: none ? ?[facility]

## 2022-04-24 NOTE — ED Notes (Signed)
ED Provider at bedside. 

## 2022-04-24 NOTE — Assessment & Plan Note (Signed)
Tobacco cessation discussed 

## 2022-04-24 NOTE — ED Notes (Addendum)
NT walking by heard patient yelling out.  When she walked in the room, she found the Pt's IV had been pulled out, blood all over the Pt's hands, and blood on the floor.   This Probation officer gathered cleaning and wound care supplies as soon as I was notified.  Pt upset and stating he had been calling out, but no one had notified this Probation officer.  ?

## 2022-04-24 NOTE — ED Notes (Signed)
Pt reports he has attempted to provide a urine sample w/o success.  Pt has received 3/4 of NS bolus.  ?

## 2022-04-24 NOTE — Assessment & Plan Note (Signed)
Clear liquid diet for now ?Judicious opioids ?Abdominal ultrasound ?Lipid panel ?IV fluids ?

## 2022-04-24 NOTE — ED Triage Notes (Signed)
Pt arrived from home with complaints of abdominal pain that started 05/04 and back pain that began on 05/14. Describes pain as sharp and constant and he has had n/v after eating.  ?

## 2022-04-24 NOTE — Assessment & Plan Note (Signed)
Holding Jardiance ?NovoLog sliding scale ?Hemoglobin A1c ?

## 2022-04-24 NOTE — Assessment & Plan Note (Signed)
Continue statin. 

## 2022-04-24 NOTE — ED Provider Notes (Signed)
?[place] ?Provider Note ? ? ?CSN: [id number] ?Arrival date & time: 04/24/22  5284 ? ?  ? ?History ? ?Chief Complaint  ?Patient presents with  ? Abdominal Pain  ? ? ?Taylor Obrien is a 56 y.o. male present emerged department of abdominal pain.  He reports onset approximately 4 days ago.  His wife provides supplemental history.  He reports he has had diffuse abdominal pain that is cramping, mostly concentrated near the epigastrium, for 3 to 4 days, and also began developing back pain that is "all across my lower back" for the same period of time.  He feels that the pains are separate, not always occurring together.  Nothing makes them better or worse.  He reports a very poor appetite and vomiting with trying to eat, and has not been able to take his medications in 2 days.  He reports no bowel movement in 3 days which is unusual for him.  He denies history of abdominal surgery or bowel obstruction.  He denies history of UTI. ? ?HPI ? ?  ? ?Home Medications ?Prior to Admission medications   ?Medication Sig Start Date End Date Taking? Authorizing Provider  ?amLODipine (NORVASC) 10 MG tablet Take 1 tablet (10 mg total) by mouth daily. 01/19/16   Kirsteins, [name], MD  ?aspirin 325 MG tablet Take 1 tablet (325 mg total) by mouth daily. 12/20/15   Love, Ivan Anchors, PA-C  ?blood glucose meter kit and supplies KIT Dispense based on patient and insurance preference. Use up to four times daily as directed. (FOR ICD-9 250.00, 250.01). 03/18/17   Forde Dandy, MD  ?cloNIDine (CATAPRES - DOSED IN MG/24 HR) 0.3 mg/24hr patch Place 1 patch (0.3 mg total) onto the skin once a week. Next change on monday ?Patient taking differently: Place 0.3 mg onto the skin every Wednesday. Next change on monday 12/20/15   Love, Ivan Anchors, PA-C  ?diphenhydramine-acetaminophen (TYLENOL PM) 25-500 MG TABS tablet Take 1-2 tablets by mouth at bedtime as needed (for sleep). Depends on insomnia if takes 1-2 tablets    [provider]  ?empagliflozin (JARDIANCE) 25 MG TABS tablet Take 25 mg by mouth daily.     [provider]  ?escitalopram (LEXAPRO) 10 MG tablet Take 10 mg by mouth daily.    [provider]  ?glucose blood test strip Use as instructed 03/18/17   Forde Dandy, MD  ?labetalol (NORMODYNE) 200 MG tablet Take 1 tablet (200 mg total) by mouth 3 (three) times daily. 01/19/16   Kirsteins, [name], MD  ?Magnesium Hydroxide (MAGNESIA PO) Take 1 tablet by mouth daily.    [provider]  ?metFORMIN (GLUCOPHAGE) 500 MG tablet Take 1 tablet (500 mg total) by mouth 2 (two) times daily with a meal. 03/18/17   Forde Dandy, MD  ?metFORMIN (GLUCOPHAGE) 500 MG tablet Take 1 tablet (500 mg total) by mouth 2 (two) times daily with a meal. 03/22/18   Milton Ferguson, MD  ?pantoprazole (PROTONIX) 40 MG tablet Take 1 tablet (40 mg total) by mouth at bedtime. 01/19/16   Kirsteins, [name], MD  ?traZODone (DESYREL) 100 MG tablet Take 1 tablet (100 mg total) by mouth at bedtime. 01/19/16   Kirsteins, [name], MD  ?varenicline (CHANTIX PAK) 0.5 MG X 11 & 1 MG X 42 tablet Take 1 mg by mouth 2 (two) times daily. Take one 0.5 mg tablet by mouth once daily for 3 days, then increase to one 0.5 mg tablet twice  daily for 4 days, then increase to one 1 mg tablet twice daily.    [provider]  ?   ? ?Allergies    ?Oxcarbazepine   ? ?Review of Systems   ?Review of Systems ? ?Physical Exam ?Updated Vital Signs ?BP (!) 147/103   Pulse 91   Temp 98.7 ?F (37.1 ?C) (Oral)   Resp 16   Ht _0  (1.651 m)   Wt 95.3 kg   SpO2 95%   BMI 34.95 kg/m?  ?Physical Exam ?Constitutional:   ?   General: He is not in acute distress. ?HENT:  ?   Head: Normocephalic and atraumatic.  ?Eyes:  ?   Conjunctiva/sclera: Conjunctivae normal.  ?   Pupils: Pupils are equal, round, and reactive to light.  ?Cardiovascular:  ?   Rate and Rhythm: Normal rate and regular rhythm.  ?Pulmonary:  ?   Effort: Pulmonary effort is normal. No  respiratory distress.  ?Abdominal:  ?   General: There is no distension.  ?   Tenderness: There is generalized abdominal tenderness.  ?Skin: ?   General: Skin is warm and dry.  ?Neurological:  ?   General: No focal deficit present.  ?   Mental Status: He is alert. Mental status is at baseline.  ?Psychiatric:     ?   Mood and Affect: Mood normal.     ?   Behavior: Behavior normal.  ? ? ?ED Results / Procedures / Treatments   ?Labs ?(all labs ordered are listed, but only abnormal results are displayed) ?Labs Reviewed  ?COMPREHENSIVE METABOLIC PANEL - Abnormal; Notable for the following components:  ?    Result Value  ? Sodium 133 (*)   ? Chloride 95 (*)   ? Glucose, Bld 186 (*)   ? BUN 38 (*)   ? Creatinine, Ser 2.26 (*)   ? Total Protein 8.8 (*)   ? Total Bilirubin 1.5 (*)   ? GFR, Estimated 34 (*)   ? All other components within normal limits  ?CBC WITH DIFFERENTIAL/PLATELET - Abnormal; Notable for the following components:  ? WBC 15.5 (*)   ? Platelets 474 (*)   ? Neutro Abs 12.8 (*)   ? Monocytes Absolute 1.4 (*)   ? All other components within normal limits  ?LIPASE, BLOOD - Abnormal; Notable for the following components:  ? Lipase 522 (*)   ? All other components within normal limits  ?URINALYSIS, ROUTINE W REFLEX MICROSCOPIC - Abnormal; Notable for the following components:  ? APPearance HAZY (*)   ? Glucose, UA >=500 (*)   ? Protein, ur 100 (*)   ? Bacteria, UA RARE (*)   ? All other components within normal limits  ?RAPID URINE DRUG SCREEN, HOSP PERFORMED - Abnormal; Notable for the following components:  ? Opiates POSITIVE (*)   ? Tetrahydrocannabinol POSITIVE (*)   ? All other components within normal limits  ?CULTURE, BLOOD (ROUTINE X 2)  ?CULTURE, BLOOD (ROUTINE X 2)  ? ? ?EKG ?EKG Interpretation ? ?Date/Time:  Wednesday Apr 24 2022 07:00:25 EDT ?Ventricular Rate:  108 ?PR Interval:  152 ?QRS Duration: 96 ?QT Interval:  356 ?QTC Calculation: 478 ?R Axis:   -23 ?Text Interpretation: Sinus tachycardia  Biatrial enlargement Abnormal R-wave progression, early transition Confirmed by Octaviano Glow [phone]) on 04/24/2022 7:04:06 AM ? ?Radiology ?CT ABDOMEN PELVIS W CONTRAST ? ?Result Date: 04/24/2022 ?CLINICAL DATA:  Epigastric/right upper quadrant tenderness with nausea and vomiting. No bowel movement for 3 days. EXAM: CT ABDOMEN  AND PELVIS WITH CONTRAST TECHNIQUE: Multidetector CT imaging of the abdomen and pelvis was performed using the standard protocol following bolus administration of intravenous contrast. RADIATION DOSE REDUCTION: This exam was performed according to the departmental dose-optimization program which includes automated exposure control, adjustment of the mA and/or kV according to patient size and/or use of iterative reconstruction technique. CONTRAST:  38m OMNIPAQUE IOHEXOL 300 MG/ML  SOLN COMPARISON:  None Available. FINDINGS: Lower chest: Clear lung bases. No significant pleural or pericardial effusion. Atherosclerosis of the aorta and coronary arteries. Small hiatal hernia. Hepatobiliary: The liver is normal in density without suspicious focal abnormality. No evidence of gallstones, gallbladder wall thickening or biliary dilatation. Pancreas: The pancreatic head is enlarged with heterogeneous low-density fluid collections measuring up to 4.5 x 3.1 cm on image 34/2. There is a smaller fluid collection within the pancreatic body measuring 1.3 cm on image 25/2. There are inflammatory changes and ill-defined fluid collections surrounding the pancreatic head and extending inferiorly along the right pararenal fascia. No evidence of pancreatic hemorrhage or necrosis. No pancreatic ductal dilatation. Spleen: Normal in size without focal abnormality. Adrenals/Urinary Tract: Both adrenal glands appear normal. Tiny nonobstructing calculus in the lower pole of the right kidney. No evidence of ureteral calculus or hydronephrosis. A well-circumscribed 1.4 cm lesion in the anterior suprahilar lip of the  right kidney measures higher than water density (36 HU) and is indeterminate.The kidneys otherwise appear unremarkable. The urinary bladder is mildly distended without focal abnormality. Stomach/Bowel: No enteric contrast administe

## 2022-04-24 NOTE — Hospital Course (Signed)
56 year old male with a history of hypertension, hyperlipidemia, diabetes mellitus, depression, GERD, tobacco abuse presenting with upper abdominal pain for at least the past 4 days.  He has a difficult historian at best.  He states that he has had this abdominal pain since he last visited the ED on 04/11/2022.  However review of the record shows that he had left lower quadrant abdominal pain at that time with some dysuria.  His urinalysis was negative for pyuria.  He was not started on any antibiotics, but subsequently he saw his PCP and was started on ciprofloxacin on 04/22/2022.  He has not had any fevers, chills, chest pain, shortness breath, headache, neck pain.  He has complained of nausea and vomiting for the past 3 days.  There is no hematemesis, diarrhea, hematochezia, melena.  He does drink alcohol rarely.  He last drank alcohol about 1 month prior to this admission.  He denies any coughing, hemoptysis, hematochezia, melena. ?In the emergency department, the patient was afebrile hemodynamically stable with oxygen saturation 95% room air.  WBC 15.5, hemoglobin 15.1, platelets 474,000.  Sodium 133, potassium 4.7, bicarbonate 25, serum creatinine 2.26.  The patient was started on IV fluids.  UA was negative for pyuria.  AST 31, ALT 36, alk phosphatase 41, total bilirubin 1.5.  CT of the abdomen and pelvis showed heterogenous enhancement of the pancreatic head with an ill-defined fluid collection consistent with acute pancreatitis with peripancreatic fluid collections.  There is no bowel obstruction. ?

## 2022-04-24 NOTE — Assessment & Plan Note (Signed)
Continue Lexapro

## 2022-04-24 NOTE — Assessment & Plan Note (Signed)
Continue labetalol, clonidine patch, amlodipine ?

## 2022-04-25 ENCOUNTER — Inpatient Hospital Stay (HOSPITAL_COMMUNITY): Payer: Medicare Other

## 2022-04-25 DIAGNOSIS — K859 Acute pancreatitis without necrosis or infection, unspecified: Secondary | ICD-10-CM

## 2022-04-25 LAB — LIPID PANEL
Cholesterol: 106 mg/dL (ref 0–200)
HDL: 19 mg/dL — ABNORMAL LOW (ref >40)
LDL Cholesterol: 41 mg/dL (ref 0–99)
Total CHOL/HDL Ratio: 5.6 RATIO
Triglycerides: 231 mg/dL — ABNORMAL HIGH (ref <150)
VLDL: 46 mg/dL — ABNORMAL HIGH (ref 0–40)

## 2022-04-25 LAB — HEMOGLOBIN A1C
Hgb A1c MFr Bld: 7.7 % — ABNORMAL HIGH (ref 4.8–5.6)
Mean Plasma Glucose: 174.29 mg/dL

## 2022-04-25 NOTE — TOC Initial Note (Addendum)
Transition of Care [facility]) - Initial/Assessment Note    Patient Details  Name: Taylor Obrien MRN: [id number] Date of Birth: 14-Jun-1966  [facility] CM/SW Contact:    Shade Flood, LCSW Phone Number: 04/25/2022, 9:40 AM  Clinical Narrative:                  Pt admitted from home. TOC received consult for PCP needs and HH/DME needs. Spoke with pt and his wife to assess. Pt's wife reports that pt does have a PCP with [facility] in [place]. She is unable to provide a specific PCP name. Pt's wife states that this used to be [facility] and that it changed names. Wife states that the provider comes to the home once a month to see patient. She also states that pt has a BSC and wheelchair for DME at home. Pt has an aide M-F 8 hours a day. Pt's wife states that she provides care when the aid is not there.  At this time, Pt and his wife deny need for any additional DME or [place] services. They are aware that if needs arise, TOC can assist with referrals.  TOC will follow.  Expected Discharge Plan: Home/Self Care Barriers to Discharge: Continued Medical Work up   Patient Goals and CMS Choice Patient states their goals for this hospitalization and ongoing recovery are:: go home CMS Medicare.gov Compare Post Acute Care list provided to:: Patient Represenative (must comment) Choice offered to / list presented to : Spouse  Expected Discharge Plan and Services Expected Discharge Plan: Home/Self Care In-house Referral: Clinical Social Work   Post Acute Care Choice: Resumption of Svcs/PTA Provider Living arrangements for the past 2 months: Single Family Home                                      Prior Living Arrangements/Services Living arrangements for the past 2 months: Single Family Home Lives with:: Spouse Patient language and need for interpreter reviewed:: Yes Do you feel safe going back to the place where you live?: Yes      Need for Family  Participation in Patient Care: Yes (Comment) Care giver support system in place?: Yes (comment) Current home services: DME, Homehealth aide Criminal Activity/Legal Involvement Pertinent to Current Situation/Hospitalization: No - Comment as needed  Activities of Daily Living Home Assistive Devices/Equipment: Wheelchair, Shower chair without back, Bedside commode/3-in-1 ADL Screening (condition at time of admission) [name] cognitive ability adequate to safely complete daily activities?: Yes Is the patient deaf or have difficulty hearing?: No Does the patient have difficulty seeing, even when wearing glasses/contacts?: No Does the patient have difficulty concentrating, remembering, or making decisions?: No Patient able to express need for assistance with ADLs?: Yes Does the patient have difficulty dressing or bathing?: No Independently performs ADLs?: Yes (appropriate for developmental age) Does the patient have difficulty walking or climbing stairs?: Yes Weakness of Legs: Left Weakness of Arms/Hands: None  Permission Sought/Granted                  Emotional Assessment   Attitude/Demeanor/Rapport: Engaged Affect (typically observed): Pleasant Orientation: : Oriented to Self, Oriented to Place, Oriented to  Time, Oriented to Situation Alcohol / Substance Use: Not Applicable Psych Involvement: No (comment)  Admission diagnosis:  Acute pancreatitis [K85.90] Acute pancreatitis, unspecified complication status, unspecified pancreatitis type [K85.90] Patient Active Problem List   Diagnosis Date Noted  Acute pancreatitis 04/24/2022   Controlled type 2 diabetes mellitus without complication, without long-term current use of insulin ([place]) 04/24/2022   Post-operative state 01/24/2018   Spastic hemiplegia affecting nondominant side ([place]) 06/03/2016   Dysuria    OSA (obstructive sleep apnea)    Hypokalemia    Elevated blood pressure    Hemiparesis affecting left side as late  effect of stroke ([place])    Epistaxis    HTN (hypertension) 11/27/2015   AKI (acute kidney injury) ([place]) 11/27/2015   Hemiplegia and hemiparesis following unspecified cerebrovascular disease affecting left non-dominant side ([place]) 11/23/2015   Gait disturbance, post-stroke 11/23/2015   Stroke due to occlusion of right anterior cerebral artery ([place]) 11/23/2015   Cerebrovascular accident (CVA) due to occlusion of right anterior cerebral artery ([place])    Essential hypertension    Depression    Chronic pain syndrome    ETOH abuse    Marijuana abuse    Cerebrovascular accident (CVA) due to thrombosis of right carotid artery ([place])    Malignant hypertension    Hyperlipidemia    CVA (cerebral infarction) 11/18/2015   Stroke (cerebrum) ([place]) 11/18/2015   Chest pain 10/09/2012   GERD (gastroesophageal reflux disease) 10/09/2012   Chronic back pain 10/09/2012   Tobacco abuse 10/09/2012   Hypertensive heart disease 08/31/2012   Accelerated hypertension 08/31/2012   Precordial pain 08/31/2012   PCP:  Pcp, No Pharmacy:   [facility], [address] [place] [address] Phone: [phone] Fax: [phone]     Social Determinants of Health (SDOH) Interventions    Readmission Risk Interventions    04/25/2022    9:38 AM  Readmission Risk Prevention Plan  Transportation Screening Complete  Home Care Screening Complete  Medication Review (RN CM) Complete

## 2022-04-25 NOTE — Progress Notes (Signed)
Patient cussing at this nurse saying I want to get the " explicit " out of here,. Patient in w/c at the elevator, patient pulled IV out in room , in w/c with family member , he signed [place] papers,. Notified Dr. Jacinta Shoe .

## 2022-04-26 ENCOUNTER — Encounter (HOSPITAL_COMMUNITY): Payer: Self-pay | Admitting: Emergency Medicine

## 2022-04-26 ENCOUNTER — Inpatient Hospital Stay (HOSPITAL_COMMUNITY)
Admission: EM | Admit: 2022-04-26 | Discharge: 2022-04-30 | DRG: 439 | Discharge status: Against Medical Advice | Payer: Medicare Other | Attending: Internal Medicine | Admitting: Internal Medicine

## 2022-04-26 ENCOUNTER — Inpatient Hospital Stay (HOSPITAL_COMMUNITY): Payer: Medicare Other

## 2022-04-26 ENCOUNTER — Other Ambulatory Visit: Payer: Self-pay

## 2022-04-26 DIAGNOSIS — I63521 Cerebral infarction due to unspecified occlusion or stenosis of right anterior cerebral artery: Secondary | ICD-10-CM | POA: Diagnosis not present

## 2022-04-26 DIAGNOSIS — Z6834 Body mass index (BMI) 34.0-34.9, adult: Secondary | ICD-10-CM

## 2022-04-26 DIAGNOSIS — Z7984 Long term (current) use of oral hypoglycemic drugs: Secondary | ICD-10-CM

## 2022-04-26 DIAGNOSIS — Z8249 Family history of ischemic heart disease and other diseases of the circulatory system: Secondary | ICD-10-CM

## 2022-04-26 DIAGNOSIS — E440 Moderate protein-calorie malnutrition: Secondary | ICD-10-CM | POA: Diagnosis not present

## 2022-04-26 DIAGNOSIS — N1832 Chronic kidney disease, stage 3b: Secondary | ICD-10-CM | POA: Diagnosis present

## 2022-04-26 DIAGNOSIS — E1122 Type 2 diabetes mellitus with diabetic chronic kidney disease: Secondary | ICD-10-CM | POA: Diagnosis present

## 2022-04-26 DIAGNOSIS — Z7982 Long term (current) use of aspirin: Secondary | ICD-10-CM

## 2022-04-26 DIAGNOSIS — N1831 Chronic kidney disease, stage 3a: Secondary | ICD-10-CM | POA: Diagnosis not present

## 2022-04-26 DIAGNOSIS — G4730 Sleep apnea, unspecified: Secondary | ICD-10-CM | POA: Diagnosis present

## 2022-04-26 DIAGNOSIS — E1165 Type 2 diabetes mellitus with hyperglycemia: Secondary | ICD-10-CM | POA: Diagnosis present

## 2022-04-26 DIAGNOSIS — I129 Hypertensive chronic kidney disease with stage 1 through stage 4 chronic kidney disease, or unspecified chronic kidney disease: Secondary | ICD-10-CM | POA: Diagnosis present

## 2022-04-26 DIAGNOSIS — E871 Hypo-osmolality and hyponatremia: Secondary | ICD-10-CM | POA: Diagnosis present

## 2022-04-26 DIAGNOSIS — K760 Fatty (change of) liver, not elsewhere classified: Secondary | ICD-10-CM | POA: Diagnosis present

## 2022-04-26 DIAGNOSIS — Z720 Tobacco use: Secondary | ICD-10-CM | POA: Diagnosis not present

## 2022-04-26 DIAGNOSIS — D649 Anemia, unspecified: Secondary | ICD-10-CM | POA: Diagnosis not present

## 2022-04-26 DIAGNOSIS — R112 Nausea with vomiting, unspecified: Secondary | ICD-10-CM | POA: Diagnosis not present

## 2022-04-26 DIAGNOSIS — R7401 Elevation of levels of liver transaminase levels: Secondary | ICD-10-CM

## 2022-04-26 DIAGNOSIS — N179 Acute kidney failure, unspecified: Secondary | ICD-10-CM

## 2022-04-26 DIAGNOSIS — I69354 Hemiplegia and hemiparesis following cerebral infarction affecting left non-dominant side: Secondary | ICD-10-CM | POA: Diagnosis not present

## 2022-04-26 DIAGNOSIS — K859 Acute pancreatitis without necrosis or infection, unspecified: Secondary | ICD-10-CM

## 2022-04-26 DIAGNOSIS — I10 Essential (primary) hypertension: Secondary | ICD-10-CM

## 2022-04-26 DIAGNOSIS — E669 Obesity, unspecified: Secondary | ICD-10-CM

## 2022-04-26 DIAGNOSIS — E46 Unspecified protein-calorie malnutrition: Secondary | ICD-10-CM

## 2022-04-26 DIAGNOSIS — K828 Other specified diseases of gallbladder: Secondary | ICD-10-CM | POA: Diagnosis present

## 2022-04-26 DIAGNOSIS — N410 Acute prostatitis: Secondary | ICD-10-CM | POA: Diagnosis present

## 2022-04-26 DIAGNOSIS — E869 Volume depletion, unspecified: Secondary | ICD-10-CM | POA: Diagnosis present

## 2022-04-26 DIAGNOSIS — K8500 Idiopathic acute pancreatitis without necrosis or infection: Secondary | ICD-10-CM

## 2022-04-26 DIAGNOSIS — F32A Depression, unspecified: Secondary | ICD-10-CM | POA: Diagnosis present

## 2022-04-26 DIAGNOSIS — G8929 Other chronic pain: Secondary | ICD-10-CM | POA: Diagnosis present

## 2022-04-26 DIAGNOSIS — R1013 Epigastric pain: Secondary | ICD-10-CM

## 2022-04-26 DIAGNOSIS — Z79899 Other long term (current) drug therapy: Secondary | ICD-10-CM | POA: Diagnosis not present

## 2022-04-26 DIAGNOSIS — E782 Mixed hyperlipidemia: Secondary | ICD-10-CM | POA: Diagnosis present

## 2022-04-26 DIAGNOSIS — D75839 Thrombocytosis, unspecified: Secondary | ICD-10-CM | POA: Diagnosis present

## 2022-04-26 DIAGNOSIS — E119 Type 2 diabetes mellitus without complications: Secondary | ICD-10-CM

## 2022-04-26 DIAGNOSIS — E8809 Other disorders of plasma-protein metabolism, not elsewhere classified: Secondary | ICD-10-CM | POA: Diagnosis present

## 2022-04-26 DIAGNOSIS — K219 Gastro-esophageal reflux disease without esophagitis: Secondary | ICD-10-CM | POA: Diagnosis present

## 2022-04-26 DIAGNOSIS — D72829 Elevated white blood cell count, unspecified: Secondary | ICD-10-CM

## 2022-04-26 DIAGNOSIS — Z888 Allergy status to other drugs, medicaments and biological substances status: Secondary | ICD-10-CM

## 2022-04-26 DIAGNOSIS — Z833 Family history of diabetes mellitus: Secondary | ICD-10-CM

## 2022-04-26 DIAGNOSIS — F17210 Nicotine dependence, cigarettes, uncomplicated: Secondary | ICD-10-CM | POA: Diagnosis present

## 2022-04-26 DIAGNOSIS — E66811 Obesity, class 1: Secondary | ICD-10-CM

## 2022-04-26 DIAGNOSIS — K852 Alcohol induced acute pancreatitis without necrosis or infection: Secondary | ICD-10-CM | POA: Diagnosis present

## 2022-04-26 DIAGNOSIS — F1010 Alcohol abuse, uncomplicated: Secondary | ICD-10-CM | POA: Diagnosis not present

## 2022-04-26 LAB — COMPREHENSIVE METABOLIC PANEL
ALT: 30 U/L (ref 0–44)
AST: 29 U/L (ref 15–41)
Albumin: 3.1 g/dL — ABNORMAL LOW (ref 3.5–5.0)
Alkaline Phosphatase: 41 U/L (ref 38–126)
Anion gap: 12 (ref 5–15)
BUN: 28 mg/dL — ABNORMAL HIGH (ref 6–20)
CO2: 23 mmol/L (ref 22–32)
Calcium: 9 mg/dL (ref 8.9–10.3)
Chloride: 98 mmol/L (ref 98–111)
Creatinine, Ser: 2.06 mg/dL — ABNORMAL HIGH (ref 0.61–1.24)
GFR, Estimated: 38 mL/min — ABNORMAL LOW (ref >60)
Glucose, Bld: 228 mg/dL — ABNORMAL HIGH (ref 70–99)
Potassium: 3.8 mmol/L (ref 3.5–5.1)
Sodium: 133 mmol/L — ABNORMAL LOW (ref 135–145)
Total Bilirubin: 0.2 mg/dL — ABNORMAL LOW (ref 0.3–1.2)
Total Protein: 7.9 g/dL (ref 6.5–8.1)

## 2022-04-26 LAB — CBC WITH DIFFERENTIAL/PLATELET
Abs Immature Granulocytes: 0.07 10*3/uL (ref 0.00–0.07)
Basophils Absolute: 0 10*3/uL (ref 0.0–0.1)
Basophils Relative: 0 %
Eosinophils Absolute: 0.2 10*3/uL (ref 0.0–0.5)
Eosinophils Relative: 2 %
HCT: 37.6 % — ABNORMAL LOW (ref 39.0–52.0)
Hemoglobin: 12.7 g/dL — ABNORMAL LOW (ref 13.0–17.0)
Immature Granulocytes: 1 %
Lymphocytes Relative: 7 %
Lymphs Abs: 0.9 10*3/uL (ref 0.7–4.0)
MCH: 32.2 pg (ref 26.0–34.0)
MCHC: 33.8 g/dL (ref 30.0–36.0)
MCV: 95.2 fL (ref 80.0–100.0)
Monocytes Absolute: 0.9 10*3/uL (ref 0.1–1.0)
Monocytes Relative: 7 %
Neutro Abs: 10.5 10*3/uL — ABNORMAL HIGH (ref 1.7–7.7)
Neutrophils Relative %: 83 %
Platelets: 408 10*3/uL — ABNORMAL HIGH (ref 150–400)
RBC: 3.95 MIL/uL — ABNORMAL LOW (ref 4.22–5.81)
RDW: 13.2 % (ref 11.5–15.5)
WBC: 12.5 10*3/uL — ABNORMAL HIGH (ref 4.0–10.5)
nRBC: 0 % (ref 0.0–0.2)

## 2022-04-26 LAB — PHOSPHORUS: Phosphorus: 2.9 mg/dL (ref 2.5–4.6)

## 2022-04-26 LAB — HIV ANTIBODY (ROUTINE TESTING W REFLEX): HIV Screen 4th Generation wRfx: NONREACTIVE

## 2022-04-26 LAB — ETHANOL: Alcohol, Ethyl (B): <10 mg/dL (ref <10)

## 2022-04-26 LAB — GLUCOSE, CAPILLARY
Glucose-Capillary: 189 mg/dL — ABNORMAL HIGH (ref 70–99)
Glucose-Capillary: 189 mg/dL — ABNORMAL HIGH (ref 70–99)
Glucose-Capillary: 164 mg/dL — ABNORMAL HIGH (ref 70–99)

## 2022-04-26 LAB — CBG MONITORING, ED: Glucose-Capillary: 189 mg/dL — ABNORMAL HIGH (ref 70–99)

## 2022-04-26 LAB — MAGNESIUM: Magnesium: 2.2 mg/dL (ref 1.7–2.4)

## 2022-04-26 MED ORDER — SODIUM CHLORIDE 0.9 % IV SOLN: INTRAVENOUS | Status: AC

## 2022-04-26 MED ORDER — TRAZODONE HCL 50 MG PO TABS
100.0000 mg | ORAL_TABLET | Freq: Every day | ORAL | Status: DC
  Administered 2022-04-26 – 2022-04-29 (×4): 100 mg via ORAL
  Filled 2022-04-26 (×4): qty 2

## 2022-04-26 MED ORDER — PANTOPRAZOLE SODIUM 40 MG PO TBEC
40.0000 mg | DELAYED_RELEASE_TABLET | Freq: Every day | ORAL | Status: DC
  Administered 2022-04-26 – 2022-04-28 (×3): 40 mg via ORAL
  Filled 2022-04-26 (×3): qty 1

## 2022-04-26 MED ORDER — SODIUM CHLORIDE 0.9 % IV BOLUS
1000.0000 mL | Freq: Once | INTRAVENOUS | Status: AC
  Administered 2022-04-26: 1000 mL via INTRAVENOUS

## 2022-04-26 MED ORDER — GLUCERNA SHAKE PO LIQD
237.0000 mL | Freq: Three times a day (TID) | ORAL | Status: DC
  Administered 2022-04-26 – 2022-04-30 (×7): 237 mL via ORAL
  Filled 2022-04-26 (×8): qty 237

## 2022-04-26 MED ORDER — THIAMINE HCL 100 MG/ML IJ SOLN
100.0000 mg | Freq: Every day | INTRAMUSCULAR | Status: DC
  Administered 2022-04-26: 100 mg via INTRAVENOUS
  Filled 2022-04-26 (×3): qty 2

## 2022-04-26 MED ORDER — HYDROMORPHONE HCL 1 MG/ML IJ SOLN
0.5000 mg | INTRAMUSCULAR | Status: DC | PRN
  Administered 2022-04-26 (×2): 0.5 mg via INTRAVENOUS
  Filled 2022-04-26 (×2): qty 0.5

## 2022-04-26 MED ORDER — THIAMINE HCL 100 MG PO TABS
100.0000 mg | ORAL_TABLET | Freq: Every day | ORAL | Status: DC
  Administered 2022-04-27 – 2022-04-30 (×4): 100 mg via ORAL
  Filled 2022-04-26 (×4): qty 1

## 2022-04-26 MED ORDER — ENOXAPARIN SODIUM 40 MG/0.4ML IJ SOSY
40.0000 mg | PREFILLED_SYRINGE | INTRAMUSCULAR | Status: DC
  Administered 2022-04-26 – 2022-04-30 (×5): 40 mg via SUBCUTANEOUS
  Filled 2022-04-26 (×5): qty 0.4

## 2022-04-26 MED ORDER — HYDROMORPHONE HCL 1 MG/ML IJ SOLN
0.5000 mg | INTRAMUSCULAR | Status: DC | PRN
  Administered 2022-04-26 – 2022-04-30 (×9): 0.5 mg via INTRAVENOUS
  Filled 2022-04-26 (×10): qty 0.5

## 2022-04-26 MED ORDER — NICOTINE 21 MG/24HR TD PT24
21.0000 mg | MEDICATED_PATCH | Freq: Every day | TRANSDERMAL | Status: DC
  Administered 2022-04-26 – 2022-04-30 (×5): 21 mg via TRANSDERMAL
  Filled 2022-04-26 (×5): qty 1

## 2022-04-26 MED ORDER — FOLIC ACID 1 MG PO TABS
1.0000 mg | ORAL_TABLET | Freq: Every day | ORAL | Status: DC
  Administered 2022-04-26 – 2022-04-30 (×5): 1 mg via ORAL
  Filled 2022-04-26 (×5): qty 1

## 2022-04-26 MED ORDER — HYDROMORPHONE HCL 1 MG/ML IJ SOLN
1.0000 mg | Freq: Once | INTRAMUSCULAR | Status: AC
Start: 2022-04-26 — End: 2022-04-26
  Administered 2022-04-26: 1 mg via INTRAVENOUS
  Filled 2022-04-26: qty 1

## 2022-04-26 MED ORDER — LABETALOL HCL 200 MG PO TABS
300.0000 mg | ORAL_TABLET | Freq: Two times a day (BID) | ORAL | Status: DC
  Administered 2022-04-26 – 2022-04-30 (×8): 300 mg via ORAL
  Filled 2022-04-26 (×8): qty 2

## 2022-04-26 MED ORDER — LORAZEPAM 2 MG/ML IJ SOLN
1.0000 mg | INTRAMUSCULAR | Status: AC | PRN
  Administered 2022-04-28: 2 mg via INTRAVENOUS
  Filled 2022-04-26: qty 1

## 2022-04-26 MED ORDER — INSULIN ASPART 100 UNIT/ML IJ SOLN
0.0000 [IU] | Freq: Three times a day (TID) | INTRAMUSCULAR | Status: DC
  Administered 2022-04-26 – 2022-04-27 (×6): 2 [IU] via SUBCUTANEOUS
  Administered 2022-04-28: 1 [IU] via SUBCUTANEOUS
  Administered 2022-04-28 (×2): 2 [IU] via SUBCUTANEOUS
  Administered 2022-04-29 (×2): 1 [IU] via SUBCUTANEOUS
  Administered 2022-04-30: 2 [IU] via SUBCUTANEOUS
  Filled 2022-04-26: qty 1

## 2022-04-26 MED ORDER — ONDANSETRON HCL 4 MG/2ML IJ SOLN
4.0000 mg | Freq: Four times a day (QID) | INTRAMUSCULAR | Status: DC | PRN
  Administered 2022-04-28: 4 mg via INTRAVENOUS
  Filled 2022-04-26: qty 2

## 2022-04-26 MED ORDER — ICOSAPENT ETHYL 1 G PO CAPS
2.0000 g | ORAL_CAPSULE | Freq: Two times a day (BID) | ORAL | Status: DC
  Administered 2022-04-26 – 2022-04-29 (×7): 2 g via ORAL
  Filled 2022-04-26 (×11): qty 2

## 2022-04-26 MED ORDER — ADULT MULTIVITAMIN W/MINERALS CH
1.0000 | ORAL_TABLET | Freq: Every day | ORAL | Status: DC
  Administered 2022-04-26 – 2022-04-30 (×5): 1 via ORAL
  Filled 2022-04-26 (×5): qty 1

## 2022-04-26 MED ORDER — LORAZEPAM 1 MG PO TABS
1.0000 mg | ORAL_TABLET | ORAL | Status: AC | PRN
  Administered 2022-04-28 – 2022-04-29 (×2): 1 mg via ORAL
  Filled 2022-04-26 (×2): qty 1

## 2022-04-26 MED ORDER — ESCITALOPRAM OXALATE 10 MG PO TABS
10.0000 mg | ORAL_TABLET | Freq: Every day | ORAL | Status: DC
  Administered 2022-04-26 – 2022-04-30 (×5): 10 mg via ORAL
  Filled 2022-04-26 (×5): qty 1

## 2022-04-26 MED ORDER — LACTATED RINGERS IV SOLN
INTRAVENOUS | Status: DC
Start: 2022-04-26 — End: 2022-04-30

## 2022-04-26 MED ORDER — ATORVASTATIN CALCIUM 40 MG PO TABS
80.0000 mg | ORAL_TABLET | Freq: Every day | ORAL | Status: DC
  Administered 2022-04-26 – 2022-04-29 (×4): 80 mg via ORAL
  Filled 2022-04-26 (×4): qty 2

## 2022-04-26 MED ORDER — FENOFIBRATE 160 MG PO TABS
160.0000 mg | ORAL_TABLET | Freq: Every day | ORAL | Status: DC
  Administered 2022-04-26 – 2022-04-28 (×3): 160 mg via ORAL
  Filled 2022-04-26 (×4): qty 1

## 2022-04-26 MED ORDER — ONDANSETRON HCL 4 MG/2ML IJ SOLN
4.0000 mg | Freq: Once | INTRAMUSCULAR | Status: AC
  Administered 2022-04-26: 4 mg via INTRAVENOUS
  Filled 2022-04-26: qty 2

## 2022-04-26 MED ORDER — INSULIN ASPART 100 UNIT/ML IJ SOLN: 0.0000 [IU] | Freq: Every day | INTRAMUSCULAR | Status: DC

## 2022-04-26 MED ORDER — METOPROLOL TARTRATE 25 MG PO TABS
25.0000 mg | ORAL_TABLET | Freq: Two times a day (BID) | ORAL | Status: DC
  Administered 2022-04-26 – 2022-04-27 (×3): 25 mg via ORAL
  Filled 2022-04-26 (×3): qty 1

## 2022-04-26 MED ORDER — AMLODIPINE BESYLATE 5 MG PO TABS
10.0000 mg | ORAL_TABLET | Freq: Every day | ORAL | Status: DC
  Administered 2022-04-26 – 2022-04-30 (×5): 10 mg via ORAL
  Filled 2022-04-26 (×5): qty 2

## 2022-04-26 NOTE — TOC Progression Note (Signed)
Transition of Care [facility]) - Progression Note    Patient Details  Name: Taylor Obrien MRN: [id number] Date of Birth: Apr 09, 1966  Transition of Care [facility]) CM/SW Contact  Shade Flood, LCSW Phone Number: 04/26/2022, 11:37 AM  Clinical Narrative:       Pt was assessed by TOC yesterday. Per MD, pt left AMA yesterday and then returned to the ED and was once again admitted. Please refer to [facility] assessment from yesterday for full assessment. TOC will follow.   Expected Discharge Plan and Services                                                 Social Determinants of Health (SDOH) Interventions    Readmission Risk Interventions    04/25/2022    9:38 AM  Readmission Risk Prevention Plan  Transportation Screening Complete  Home Care Screening Complete  Medication Review (RN CM) Complete

## 2022-04-26 NOTE — Progress Notes (Signed)
BP has been elevated.  Dr. Carles Collet added metoprolol and increased frequency of dilaudid.  Did no eat any supper off clear liquid tray but has had gingerale , no vomiting.

## 2022-04-26 NOTE — ED Provider Notes (Signed)
[facility] EMERGENCY DEPARTMENT Provider Note   CSN: [id number] Arrival date & time: 04/26/22  0109     History  Chief Complaint  Patient presents with   Abdominal Pain    Taylor Obrien is a 56 y.o. male.  Patient presents to the emergency department for persistent upper abdominal pain radiating into the back.  Patient with nausea and vomiting.  He reports that he was admitted for pancreatitis yesterday, left AMA this morning.  He has been having pain and cannot tolerate any oral intake all day, "could not take the pain anymore".      Home Medications Prior to Admission medications   Medication Sig Start Date End Date Taking? Authorizing Provider  amLODipine (NORVASC) 10 MG tablet Take 1 tablet (10 mg total) by mouth daily. 01/19/16   Kirsteins, [name], MD  aspirin 325 MG tablet Take 1 tablet (325 mg total) by mouth daily. 12/20/15   Love, Ivan Anchors, PA-C  atorvastatin (LIPITOR) 80 MG tablet Take 80 mg by mouth daily. 03/08/22   [provider]  baclofen (LIORESAL) 10 MG tablet Take 10 mg by mouth daily. 04/17/22   [provider]  blood glucose meter kit and supplies KIT Dispense based on patient and insurance preference. Use up to four times daily as directed. (FOR ICD-9 250.00, 250.01). 03/18/17   Forde Dandy, MD  ciprofloxacin (CIPRO) 500 MG tablet Take 500 mg by mouth 2 (two) times daily. 04/22/22   [provider]  cloNIDine (CATAPRES - DOSED IN MG/24 HR) 0.3 mg/24hr patch Place 1 patch (0.3 mg total) onto the skin once a week. Next change on monday Patient taking differently: Place 0.3 mg onto the skin every Wednesday. Next change on monday 12/20/15   Love, Ivan Anchors, PA-C  diphenhydrAMINE (BENADRYL) 25 mg capsule Take 25 mg by mouth at bedtime as needed for sleep.    [provider]  diphenhydramine-acetaminophen (TYLENOL PM) 25-500 MG TABS tablet Take 1-2 tablets by mouth at bedtime as needed (for sleep). Depends on insomnia if takes 1-2  tablets    [provider]  empagliflozin (JARDIANCE) 25 MG TABS tablet Take 25 mg by mouth daily.     [provider]  escitalopram (LEXAPRO) 10 MG tablet Take 10 mg by mouth daily.    [provider]  fenofibrate (TRICOR) 145 MG tablet Take 145 mg by mouth daily. 04/10/22   [provider]  glucose blood test strip Use as instructed 03/18/17   Forde Dandy, MD  hydrochlorothiazide (HYDRODIURIL) 25 MG tablet Take 25 mg by mouth daily. 04/17/22   [provider]  hydrOXYzine (ATARAX) 25 MG tablet Take 25 mg by mouth every 8 (eight) hours as needed for anxiety. 04/17/22   [provider]  labetalol (NORMODYNE) 300 MG tablet Take 300 mg by mouth 2 (two) times daily. 03/08/22   [provider]  metFORMIN (GLUCOPHAGE) 500 MG tablet Take 1 tablet (500 mg total) by mouth 2 (two) times daily with a meal. 03/18/17   Forde Dandy, MD  metFORMIN (GLUCOPHAGE) 500 MG tablet Take 1 tablet (500 mg total) by mouth 2 (two) times daily with a meal. Patient not taking: Reported on 04/24/2022 03/22/18   Milton Ferguson, MD  naproxen (NAPROSYN) 500 MG tablet Take 500 mg by mouth 2 (two) times daily. 03/12/22   [provider]  oxyCODONE (OXY IR/ROXICODONE) 5 MG immediate release tablet Take 5 mg by mouth every 8 (eight) hours. 04/17/22   [provider]  pantoprazole (PROTONIX) 40 MG tablet Take 1 tablet (40 mg total) by mouth at bedtime. 01/19/16   Kirsteins, [name], MD  traZODone (DESYREL) 100 MG tablet Take 1 tablet (100 mg total) by mouth at bedtime. 01/19/16   Kirsteins, [name], MD  triamcinolone cream (KENALOG) 0.1 % Apply 1 application. topically daily.    [provider]  VASCEPA 1 g capsule Take 2 g by mouth 2 (two) times daily. 03/12/22   [provider]      Allergies    Oxcarbazepine    Review of Systems   Review of Systems  Physical Exam Updated Vital Signs BP (!) 163/103   Pulse 89   Temp 98.8 F (37.1 C)    Resp 19   Ht 5' 5"  (1.651 m)   Wt 95.3 kg   SpO2 95%   BMI 34.96 kg/m  Physical Exam Vitals and nursing note reviewed.  Constitutional:      General: He is not in acute distress.    Appearance: He is well-developed.  HENT:     Head: Normocephalic and atraumatic.     Mouth/Throat:     Mouth: Mucous membranes are moist.  Eyes:     General: Vision grossly intact. Gaze aligned appropriately.     Extraocular Movements: Extraocular movements intact.     Conjunctiva/sclera: Conjunctivae normal.  Cardiovascular:     Rate and Rhythm: Normal rate and regular rhythm.     Pulses: Normal pulses.     Heart sounds: Normal heart sounds, S1 normal and S2 normal. No murmur heard.   No friction rub. No gallop.  Pulmonary:     Effort: Pulmonary effort is normal. No respiratory distress.     Breath sounds: Normal breath sounds.  Abdominal:     Palpations: Abdomen is soft.     Tenderness: There is abdominal tenderness in the epigastric area and left upper quadrant. There is no guarding or rebound.     Hernia: No hernia is present.  Musculoskeletal:        General: No swelling.     Cervical back: Full passive range of motion without pain, normal range of motion and neck supple. No pain with movement, spinous process tenderness or muscular tenderness. Normal range of motion.     Right lower leg: No edema.     Left lower leg: No edema.  Skin:    General: Skin is warm and dry.     Capillary Refill: Capillary refill takes less than 2 seconds.     Findings: No ecchymosis, erythema, lesion or wound.  Neurological:     Mental Status: He is alert and oriented to person, place, and time.     GCS: GCS eye subscore is 4. GCS verbal subscore is 5. GCS motor subscore is 6.     Cranial Nerves: Cranial nerves 2-12 are intact.     Sensory: Sensation is intact.     Motor: Motor function is intact. No weakness or abnormal muscle tone.     Coordination: Coordination is intact.  Psychiatric:        Mood and  Affect: Mood normal.        Speech: Speech normal.        Behavior: Behavior normal.    ED Results / Procedures / Treatments   Labs (all labs ordered are listed, but only abnormal results are displayed) Labs Reviewed  COMPREHENSIVE METABOLIC PANEL - Abnormal; Notable for the following components:      Result  Value   Sodium 133 (*)    Glucose, Bld 228 (*)    BUN 28 (*)    Creatinine, Ser 2.06 (*)    Albumin 3.1 (*)    Total Bilirubin 0.2 (*)    GFR, Estimated 38 (*)    All other components within normal limits  LIPASE, BLOOD - Abnormal; Notable for the following components:   Lipase 391 (*)    All other components within normal limits  CBC WITH DIFFERENTIAL/PLATELET - Abnormal; Notable for the following components:   WBC 12.5 (*)    RBC 3.95 (*)    Hemoglobin 12.7 (*)    HCT 37.6 (*)    Platelets 408 (*)    Neutro Abs 10.5 (*)    All other components within normal limits  ETHANOL  CBC WITH DIFFERENTIAL/PLATELET    EKG None  Radiology CT ABDOMEN PELVIS W CONTRAST  Result Date: 04/24/2022 CLINICAL DATA:  Epigastric/right upper quadrant tenderness with nausea and vomiting. No bowel movement for 3 days. EXAM: CT ABDOMEN AND PELVIS WITH CONTRAST TECHNIQUE: Multidetector CT imaging of the abdomen and pelvis was performed using the standard protocol following bolus administration of intravenous contrast. RADIATION DOSE REDUCTION: This exam was performed according to the departmental dose-optimization program which includes automated exposure control, adjustment of the mA and/or kV according to patient size and/or use of iterative reconstruction technique. CONTRAST:  80m OMNIPAQUE IOHEXOL 300 MG/ML  SOLN COMPARISON:  None Available. FINDINGS: Lower chest: Clear lung bases. No significant pleural or pericardial effusion. Atherosclerosis of the aorta and coronary arteries. Small hiatal hernia. Hepatobiliary: The liver is normal in density without suspicious focal abnormality. No  evidence of gallstones, gallbladder wall thickening or biliary dilatation. Pancreas: The pancreatic head is enlarged with heterogeneous low-density fluid collections measuring up to 4.5 x 3.1 cm on image 34/2. There is a smaller fluid collection within the pancreatic body measuring 1.3 cm on image 25/2. There are inflammatory changes and ill-defined fluid collections surrounding the pancreatic head and extending inferiorly along the right pararenal fascia. No evidence of pancreatic hemorrhage or necrosis. No pancreatic ductal dilatation. Spleen: Normal in size without focal abnormality. Adrenals/Urinary Tract: Both adrenal glands appear normal. Tiny nonobstructing calculus in the lower pole of the right kidney. No evidence of ureteral calculus or hydronephrosis. A well-circumscribed 1.4 cm lesion in the anterior suprahilar lip of the right kidney measures higher than water density (36 HU) and is indeterminate.The kidneys otherwise appear unremarkable. The urinary bladder is mildly distended without focal abnormality. Stomach/Bowel: No enteric contrast administered. The stomach appears unremarkable for its degree of distension. No evidence of bowel wall thickening, distention or surrounding inflammatory change. The appendix appears normal. Mildly prominent stool throughout the colon. Vascular/Lymphatic: There are no enlarged abdominal or pelvic lymph nodes. Diffuse aortic and branch vessel atherosclerosis without aneurysm. The portal, superior mesenteric and splenic veins are patent. Reproductive: The prostate gland and seminal vesicles appear normal. Other: As above, peripancreatic inflammatory changes with inferior extension along the right anterior pararenal fascia and base of the mesentery. No free air or ascites. Musculoskeletal: No acute or significant osseous findings. Mild lumbar spine facet arthropathy. IMPRESSION: 1. Heterogeneous enlargement of the pancreatic head with ill-defined fluid collections and  surrounding inflammatory changes consistent with acute pancreatitis and acute peripancreatic fluid collections in this patient with an elevated serum lipase level. No evidence of pancreatic necrosis or hemorrhage. 2. No evidence of bowel obstruction or focal bowel lesion. 3. Indeterminate right renal lesion, statistically a mildly complex cyst  which measures higher than water density. Consider nonemergent renal ultrasound for further characterization. Alternatively, if follow up abdominal CT is planned to address the patient's pancreatitis, inclusion of pre contrast images through the kidneys could be performed to better characterize this lesion. 4. Coronary and Aortic Atherosclerosis (ICD10-I70.0). Electronically Signed   By: Richardean Sale M.D.   On: 04/24/2022 11:34    Procedures Procedures    Medications Ordered in ED Medications  HYDROmorphone (DILAUDID) injection 1 mg (has no administration in time range)  ondansetron (ZOFRAN) injection 4 mg (has no administration in time range)  sodium chloride 0.9 % bolus 1,000 mL (0 mLs Intravenous Stopped 04/26/22 0308)    ED Course/ Medical Decision Making/ A&P                           Medical Decision Making Problems Addressed: Acute pancreatitis without infection or necrosis, unspecified pancreatitis type: acute illness or injury that poses a threat to life or bodily functions  Amount and/or Complexity of Data Reviewed External Data Reviewed: labs, radiology and notes.    Details: ED presentation from yesterday, admission notes. Labs: ordered. Decision-making details documented in ED Course.  Risk Prescription drug management. Parenteral controlled substances. Drug therapy requiring intensive monitoring for toxicity. Decision regarding hospitalization.   Returns with persistent pain secondary to pancreatitis.  Patient was admitted yesterday for acute pancreatitis.  He did have CT scan at that time that showed inflammatory changes without  complication.  Patient continues to have pain after he left AMA this morning after a dispute about pain medications.  He reports that he has not been able to eat or drink.  He does not appear acutely ill.  He is afebrile.  He was mildly tachycardic, improved after IV fluids.  I do not feel he requires repeat imaging as his lipase has dropped somewhat from yesterday.  He will, however, require pain management.  Will readmit patient.        Final Clinical Impression(s) / ED Diagnoses Final diagnoses:  Acute pancreatitis without infection or necrosis, unspecified pancreatitis type    Rx / DC Orders ED Discharge Orders     None         Orpah Greek, MD 04/26/22 [phone]

## 2022-04-26 NOTE — Assessment & Plan Note (Addendum)
04/25/2022 hemoglobin A1c 7.7 NovoLog sliding scale Holding Jardiance>>restart after d/c Restart metformin after d/c

## 2022-04-26 NOTE — Assessment & Plan Note (Addendum)
Baseline creatinine 1.2-1.5 Patient presented with serum creatinine 2.26 Secondary to volume depletion Continue IV fluids>>improved Serum creatinine

## 2022-04-26 NOTE — H&P (Signed)
History and Physical    Patient: Taylor Obrien DOB: 1966/06/11 DOA: 04/26/2022 DOS: the patient was seen and examined on 04/26/2022 PCP: [facility]  Patient coming from: Home  Chief Complaint:  Chief Complaint  Patient presents with   Abdominal Pain   HPI: Taylor Obrien is a 56 y.o. male with medical history significant of type 2 diabetes mellitus, hypertension, hyperlipidemia, GERD, depression, tobacco abuse who presents to the emergency department due to persistent abdominal pain that has been going on for about 6 days.  Patient was admitted on 04/24/22 due to acute pancreatitis, however, he left AMA yesterday.  He complained of persistent upper abdominal pain within 2 hours of returning home, the pain was burning in nature and radiates to the back, pain was moderate intensity and was associated with nausea and vomiting.  Lying still in bed improved the pain, but movement aggravates the pain. When asked why he left AMA, he stated that he was scared since he did not know the scope of his illness.  Patient states that he was reassured and encouraged by wife to return to the ED for further management of the abdominal pain.  ED Course:  In the emergency department, he was hemodynamically stable, BP was 163/103, other vital signs were within normal range.  Work-up in the ED showed leukocytosis, thrombocytosis, normocytic anemia, hyponatremia, hyperglycemia, BUN 28/2.06 (creatinine is within baseline range).  Lipase 391, call level was less than 10.  Blood culture ending. CT abdomen and pelvis with contrast done on 5/17 showed heterogeneous enlargement of the pancreatic head with ill-defined fluid collections and surrounding inflammatory changes consistent with acute pancreatitis and acute peripancreatic fluid collections Patient was treated with IV Dilaudid 1 mg x 1, Zofran was given and patient was started on IV hydration.  Hospitalist was asked to admit patient for  further evaluation and management.  Review of Systems: Review of systems as noted in the HPI. All other systems reviewed and are negative.   Past Medical History:  Diagnosis Date   Arthritis    Chronic pain    Depression    Diabetes mellitus without complication (HCC)    Essential hypertension, benign    GERD (gastroesophageal reflux disease)    Headache    HTN (hypertension) 11/27/2015   Hyperlipidemia    Sleep apnea    cpap    Stroke ([place])    2016   Past Surgical History:  Procedure Laterality Date   CLOSED REDUCTION MANDIBULAR FRACTURE W/ ARCH BARS     + multiple extractions   Condyloma resection     MULTIPLE EXTRACTIONS WITH ALVEOLOPLASTY Bilateral 01/23/2018   Procedure: DENTAL EXTRACTION OF TEETH NUMBER ONE, TWO, THREE, FOUR, FIVE, SIX, SEVEN, EIGHT, NINE, TEN, ELEVEN, TWELVE, THIRTEEN, FOURTEEN, FIFTEEN, SIXTEEN, SEVENTEEN, TWENTY, TWENTY-ONE, TWENTY-TWO, TWENTY-THREE, TWENTY-FOUR, TWENTY-FIVE, TWENTY-SIX, TWENTY-SEVEN, TWENTY-EIGHT, TWENTY-NINE, THIRTY-TWO WITH ALVEOLOPLASTY;  Surgeon: Diona Browner, DDS;  Location: [place];  Service: Oral Surgery;  Lat   RADIOLOGY WITH ANESTHESIA N/A 11/18/2015   Procedure: RADIOLOGY WITH ANESTHESIA;  Surgeon: Luanne Bras, MD;  Location: [place];  Service: Radiology;  Laterality: N/A;   Removal foreign body right shoulder     Right rotator cuff repair     TEE WITHOUT CARDIOVERSION N/A 11/21/2015   Procedure: TRANSESOPHAGEAL ECHOCARDIOGRAM (TEE);  Surgeon: Larey Dresser, MD;  Location: [facility] ENDOSCOPY;  Service: Cardiovascular;  Laterality: N/A;   TOOTH EXTRACTION N/A 01/24/2018   Procedure: SUTURE ORAL WOUND;  Surgeon: Diona Browner, DDS;  Location: [facility]  OR;  Service: Oral Surgery;  Laterality: N/A;    Social History:  reports that he has been smoking cigarettes. He has a 15.00 pack-year smoking history. He has never used smokeless tobacco. He reports current drug use. Drug: Marijuana. He reports that he does not drink  alcohol.   Allergies  Allergen Reactions   Oxcarbazepine Other (See Comments)    Patient goes out of right state of mind.     Family History  Problem Relation Age of Onset   Diabetes Mother    Hypertension Mother    Drug abuse Mother    Hyperlipidemia Mother    Diabetes Father    Hypertension Father    Hyperlipidemia Father    Diabetes Brother    Hypertension Brother      Prior to Admission medications   Medication Sig Start Date End Date Taking? Authorizing Provider  amLODipine (NORVASC) 10 MG tablet Take 1 tablet (10 mg total) by mouth daily. 01/19/16   Kirsteins, [name], MD  aspirin 325 MG tablet Take 1 tablet (325 mg total) by mouth daily. 12/20/15   Love, Ivan Anchors, PA-C  atorvastatin (LIPITOR) 80 MG tablet Take 80 mg by mouth daily. 03/08/22   [provider]  baclofen (LIORESAL) 10 MG tablet Take 10 mg by mouth daily. 04/17/22   [provider]  blood glucose meter kit and supplies KIT Dispense based on patient and insurance preference. Use up to four times daily as directed. (FOR ICD-9 250.00, 250.01). 03/18/17   Forde Dandy, MD  ciprofloxacin (CIPRO) 500 MG tablet Take 500 mg by mouth 2 (two) times daily. 04/22/22   [provider]  cloNIDine (CATAPRES - DOSED IN MG/24 HR) 0.3 mg/24hr patch Place 1 patch (0.3 mg total) onto the skin once a week. Next change on monday Patient taking differently: Place 0.3 mg onto the skin every Wednesday. Next change on monday 12/20/15   Love, Ivan Anchors, PA-C  diphenhydrAMINE (BENADRYL) 25 mg capsule Take 25 mg by mouth at bedtime as needed for sleep.    [provider]  diphenhydramine-acetaminophen (TYLENOL PM) 25-500 MG TABS tablet Take 1-2 tablets by mouth at bedtime as needed (for sleep). Depends on insomnia if takes 1-2 tablets    [provider]  empagliflozin (JARDIANCE) 25 MG TABS tablet Take 25 mg by mouth daily.     [provider]  escitalopram (LEXAPRO) 10 MG tablet Take 10 mg by  mouth daily.    [provider]  fenofibrate (TRICOR) 145 MG tablet Take 145 mg by mouth daily. 04/10/22   [provider]  glucose blood test strip Use as instructed 03/18/17   Forde Dandy, MD  hydrochlorothiazide (HYDRODIURIL) 25 MG tablet Take 25 mg by mouth daily. 04/17/22   [provider]  hydrOXYzine (ATARAX) 25 MG tablet Take 25 mg by mouth every 8 (eight) hours as needed for anxiety. 04/17/22   [provider]  labetalol (NORMODYNE) 300 MG tablet Take 300 mg by mouth 2 (two) times daily. 03/08/22   [provider]  metFORMIN (GLUCOPHAGE) 500 MG tablet Take 1 tablet (500 mg total) by mouth 2 (two) times daily with a meal. 03/18/17   Forde Dandy, MD  metFORMIN (GLUCOPHAGE) 500 MG tablet Take 1 tablet (500 mg total) by mouth 2 (two) times daily with a meal. Patient not taking: Reported on 04/24/2022 03/22/18   Milton Ferguson, MD  naproxen (NAPROSYN) 500 MG tablet Take 500 mg by mouth 2 (two) times daily.  03/12/22   [provider]  oxyCODONE (OXY IR/ROXICODONE) 5 MG immediate release tablet Take 5 mg by mouth every 8 (eight) hours. 04/17/22   [provider]  pantoprazole (PROTONIX) 40 MG tablet Take 1 tablet (40 mg total) by mouth at bedtime. 01/19/16   Kirsteins, [name], MD  traZODone (DESYREL) 100 MG tablet Take 1 tablet (100 mg total) by mouth at bedtime. 01/19/16   Kirsteins, [name], MD  triamcinolone cream (KENALOG) 0.1 % Apply 1 application. topically daily.    [provider]  VASCEPA 1 g capsule Take 2 g by mouth 2 (two) times daily. 03/12/22   [provider]    Physical Exam: BP (!) 163/103   Pulse 92   Temp 98.8 F (37.1 C)   Resp 19   Ht 5' 5" (1.651 m)   Wt 95.3 kg   SpO2 96%   BMI 34.96 kg/m   General: 56 y.o. year-old male well developed well nourished in no acute distress.  Alert and oriented x3. HEENT: NCAT, EOMI Neck: Supple, trachea medial Cardiovascular: Regular rate and rhythm with no  rubs or gallops.  No thyromegaly or JVD noted.  No lower extremity edema. 2/4 pulses in all 4 extremities. Respiratory: Clear to auscultation with no wheezes or rales. Good inspiratory effort. Abdomen: Soft, tender to palpation of epigastric area.  Nondistended with normal bowel sounds x4 quadrants. Muskuloskeletal: No cyanosis, clubbing or edema noted bilaterally Neuro: CN II-XII intact, [name] with reduced strength, sensation, reflexes intact Skin: No ulcerative lesions noted or rashes Psychiatry: Judgement and insight appear normal. Mood is appropriate for condition and setting          Labs on Admission:  Basic Metabolic Panel: Recent Labs  Lab 04/24/22 0727 04/26/22 0121  NA 133* 133*  K 4.7 3.8  CL 95* 98  CO2 25 23  GLUCOSE 186* 228*  BUN 38* 28*  CREATININE 2.26* 2.06*  CALCIUM 9.5 9.0   Liver Function Tests: Recent Labs  Lab 04/24/22 0727 04/26/22 0121  AST 31 29  ALT 36 30  ALKPHOS 41 41  BILITOT 1.5* 0.2*  PROT 8.8* 7.9  ALBUMIN 3.6 3.1*   Recent Labs  Lab 04/24/22 0727 04/26/22 0121  LIPASE 522* 391*   No results for input(s): AMMONIA in the last 168 hours. CBC: Recent Labs  Lab 04/24/22 0727 04/26/22 0132  WBC 15.5* 12.5*  NEUTROABS 12.8* 10.5*  HGB 15.1 12.7*  HCT 43.6 37.6*  MCV 93.0 95.2  PLT 474* 408*   Cardiac Enzymes: No results for input(s): CKTOTAL, CKMB, CKMBINDEX, TROPONINI in the last 168 hours.  BNP (last 3 results) No results for input(s): BNP in the last 8760 hours.  ProBNP (last 3 results) No results for input(s): PROBNP in the last 8760 hours.  CBG: No results for input(s): GLUCAP in the last 168 hours.  Radiological Exams on Admission: CT ABDOMEN PELVIS W CONTRAST  Result Date: 04/24/2022 CLINICAL DATA:  Epigastric/right upper quadrant tenderness with nausea and vomiting. No bowel movement for 3 days. EXAM: CT ABDOMEN AND PELVIS WITH CONTRAST TECHNIQUE: Multidetector CT imaging of the abdomen and pelvis was performed  using the standard protocol following bolus administration of intravenous contrast. RADIATION DOSE REDUCTION: This exam was performed according to the departmental dose-optimization program which includes automated exposure control, adjustment of the mA and/or kV according to patient size and/or use of iterative reconstruction technique. CONTRAST:  89m OMNIPAQUE IOHEXOL 300 MG/ML  SOLN COMPARISON:  None Available. FINDINGS: Lower chest:  Clear lung bases. No significant pleural or pericardial effusion. Atherosclerosis of the aorta and coronary arteries. Small hiatal hernia. Hepatobiliary: The liver is normal in density without suspicious focal abnormality. No evidence of gallstones, gallbladder wall thickening or biliary dilatation. Pancreas: The pancreatic head is enlarged with heterogeneous low-density fluid collections measuring up to 4.5 x 3.1 cm on image 34/2. There is a smaller fluid collection within the pancreatic body measuring 1.3 cm on image 25/2. There are inflammatory changes and ill-defined fluid collections surrounding the pancreatic head and extending inferiorly along the right pararenal fascia. No evidence of pancreatic hemorrhage or necrosis. No pancreatic ductal dilatation. Spleen: Normal in size without focal abnormality. Adrenals/Urinary Tract: Both adrenal glands appear normal. Tiny nonobstructing calculus in the lower pole of the right kidney. No evidence of ureteral calculus or hydronephrosis. A well-circumscribed 1.4 cm lesion in the anterior suprahilar lip of the right kidney measures higher than water density (36 HU) and is indeterminate.The kidneys otherwise appear unremarkable. The urinary bladder is mildly distended without focal abnormality. Stomach/Bowel: No enteric contrast administered. The stomach appears unremarkable for its degree of distension. No evidence of bowel wall thickening, distention or surrounding inflammatory change. The appendix appears normal. Mildly prominent stool  throughout the colon. Vascular/Lymphatic: There are no enlarged abdominal or pelvic lymph nodes. Diffuse aortic and branch vessel atherosclerosis without aneurysm. The portal, superior mesenteric and splenic veins are patent. Reproductive: The prostate gland and seminal vesicles appear normal. Other: As above, peripancreatic inflammatory changes with inferior extension along the right anterior pararenal fascia and base of the mesentery. No free air or ascites. Musculoskeletal: No acute or significant osseous findings. Mild lumbar spine facet arthropathy. IMPRESSION: 1. Heterogeneous enlargement of the pancreatic head with ill-defined fluid collections and surrounding inflammatory changes consistent with acute pancreatitis and acute peripancreatic fluid collections in this patient with an elevated serum lipase level. No evidence of pancreatic necrosis or hemorrhage. 2. No evidence of bowel obstruction or focal bowel lesion. 3. Indeterminate right renal lesion, statistically a mildly complex cyst which measures higher than water density. Consider nonemergent renal ultrasound for further characterization. Alternatively, if follow up abdominal CT is planned to address the patient's pancreatitis, inclusion of pre contrast images through the kidneys could be performed to better characterize this lesion. 4. Coronary and Aortic Atherosclerosis (ICD10-I70.0). Electronically Signed   By: Richardean Sale M.D.   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 ([place])  Essential hypertension  GERD (gastroesophageal reflux disease)  Active Problems:   GERD (gastroesophageal reflux disease)   Tobacco abuse   Mixed hyperlipidemia   Essential hypertension   Stroke due to occlusion of right anterior cerebral  artery (HCC)   Acute pancreatitis   Leukocytosis   Thrombocytosis   Type 2 diabetes mellitus with hyperglycemia (HCC)   Hypoalbuminemia due to protein-calorie malnutrition (HCC)   Chronic kidney disease, stage 3b (HCC)  Abdominal pain, nausea and vomiting secondary to acute pancreatitis CT abdomen pelvis done on 5/17 showed Heterogeneous enlargement of the pancreatic head with ill-defined fluid collections and surrounding inflammatory changes consistent with acute pancreatitis and acute peripancreatic fluid collections BISAP Score = 1 point Continue IV Zofran p.r.n Continue IV Dilaudid p.r.n for pain Continue Protonix Continue IV hydration Continue clear liquid diet with plan to advance diet  as tolerated RUQ U/S in the morning to investigate biliary etiology (gallstone and bile duct dilatation  Hypoalbuminemia secondary to moderate protein calorie malnutrition Albumin 3.1, protein supplement to be provided  Leukocytosis possibly reactive WBC 12.5, continue to monitor WBC with morning labs  Thrombocytosis possibly reactive Platelets 408, continue to monitor platelet levels with morning labs  Pseudohyponatremia Na 133; rectal sodium level based on CBG (288) is 136 Continue to monitor sodium levels  Chronic kidney disease stage IIIb BUN 28/2.06 (creatinine is within baseline range) Continue gentle hydration Renally adjust medications, avoid nephrotoxic agents/dehydration/hypotension  CVA with residual left lower extremity weakness Continue Lipitor   Type 2 diabetes mellitus with hyperglycemia Hemoglobin A1c on 5/18 was 7.7 Continue insulin sliding scale and hypoglycemic protocol Metformin will be held at this time  Essential hypertension Continue amlodipine, labetalol, clonidine patch  Mixed hyperlipidemia Continue Lipitor, fenofibrate and Vascepa  GERD Continue Protonix  Depression Continue Lexapro  Tobacco abuse Patient counseled on tobacco abuse  cessation  DVT prophylaxis: Lovenox   Advance Care Planning:   Code Status: Full Code   Consults: None  Family Communication: None at bedside  Severity of Illness: The appropriate patient status for this patient is INPATIENT. Inpatient status is judged to be reasonable and necessary in order to provide the required intensity of service to ensure the patient's safety. The patient's presenting symptoms, physical exam findings, and initial radiographic and laboratory data in the context of their chronic comorbidities is felt to place them at high risk for further clinical deterioration. Furthermore, it is not anticipated that the patient will be medically stable for discharge from the hospital within 2 midnights of admission.   * I certify that at the point of admission it is my clinical judgment that the patient will require inpatient hospital care spanning beyond 2 midnights from the point of admission due to high intensity of service, high risk for further deterioration and high frequency of surveillance required.*  Author: Bernadette Hoit, DO 04/26/2022 5:52 AM  For on call review [email or web address].

## 2022-04-26 NOTE — ED Notes (Signed)
Pt sitting in chair at foot of bed refuses to move back to bed temporarily  to get updated BP.

## 2022-04-26 NOTE — Hospital Course (Addendum)
56 year old male with a history of hypertension, hyperlipidemia, diabetes mellitus, depression, GERD, tobacco abuse presenting with upper abdominal pain for at least the past 4 days.  He has a difficult historian at best.  He states that he has had this abdominal pain since he last visited the ED on 04/11/2022.  However, review of the record shows that he had left lower quadrant abdominal pain at that time with some dysuria.  His urinalysis was negative for pyuria.  He was not started on any antibiotics, but subsequently he saw his PCP and was started on ciprofloxacin on 04/22/2022.  He has not had any fevers, chills, chest pain, shortness breath, headache, neck pain.  He has complained of nausea and vomiting for the past 3 days.  There is no hematemesis, diarrhea, hematochezia, melena.  He was initially admitted and started on treatment for acute pancreatitis on 04/24/22.  He left AMA on 04/25/22.  He returned to the ED on 04/26/22 with persistent abdominal pain that worsened again within 2 hours of returning home radiating to the back.    ED Course:  In the emergency department, he was hemodynamically stable, BP was 163/103, other vital signs were within normal range.  Work-up in the ED showed leukocytosis, thrombocytosis, normocytic anemia, hyponatremia, hyperglycemia, BUN 28/2.06 (creatinine is within baseline range).  Lipase 391, call level was less than 10.  Blood culture ending. CT abdomen and pelvis with contrast done on 5/17 showed heterogeneous enlargement of the pancreatic head with ill-defined fluid collections and surrounding inflammatory changes consistent with acute pancreatitis and acute peripancreatic fluid collections Patient was treated with IV Dilaudid 1 mg x 1, Zofran was given and patient was started on IV hydration.  Hospitalist was asked to admit patient for further evaluation and management.  The patient gradually improved with conservative care.  He was started on IVF and IV opioids and  bowel rest.  His abd pain gradually improved.  His n/v improved and his diet was gradually advanced.  Unfortunately, his LFTs began to climb during the hospitalization.  Any offending meds were discontinued, but his LFTs continued to rise.  GI was consulted to assist.  They ordered a repeat abd [place].  The patient did not want to stay for further work up.  As a result, the patient left against medical advice.    In speaking with Taylor Obrien, Taylor Obrien has demonstrated the ability to understand his medical condition(s) which include recurrent pancreatitis and elevated liver enzymes.  Taylor Obrien has demonstrated the ability to appreciate how treatment for recurrent pancreatitis and elevated liver enzymes.   will be beneficial.   Taylor Obrien has also demonstrated the ability to understand and appreciate how refusal of treatement for  recurrent pancreatitis and elevated liver enzymes.  could result in harm, repeat hospitalization, and possibly death.  Taylor Obrien demonstrates the ability to reason through the risks and benefits of the proposed treatment.  Finally, Taylor Obrien is able to clearly communicate his/her choice.

## 2022-04-26 NOTE — ED Notes (Signed)
MD, [name], notified of pt assisted fall and states he will put in orders for CT scan

## 2022-04-26 NOTE — Assessment & Plan Note (Signed)
Continue Lexapro

## 2022-04-26 NOTE — ED Notes (Signed)
Gave ice water

## 2022-04-26 NOTE — ED Notes (Signed)
Pt still refusing to get into bed from the chair. Unable to get updated vital signs.

## 2022-04-26 NOTE — Assessment & Plan Note (Signed)
Likely acute phase reactant Iron saturation 15 Ferritin 557

## 2022-04-26 NOTE — Assessment & Plan Note (Addendum)
Lipase peaked at 522 Patient endorsed drinking up to four x 40 ounces beers daily 5/18 RUQ US--gallbladder sludge without cholecystitis.  Hepatic steatosis. 5/22--advance to soft diet which the patient tolerated Triglycerides 231 Judicious opioids

## 2022-04-26 NOTE — Progress Notes (Signed)
PROGRESS NOTE  Taylor Obrien:[id number] DOB: [birth year] DOA: 04/26/2022 PCP: [facility]  Brief History:   56 year old male with a history of hypertension, hyperlipidemia, diabetes mellitus, depression, GERD, tobacco abuse presenting with upper abdominal pain for at least the past 4 days.  He has a difficult historian at best.  He states that he has had this abdominal pain since he last visited the ED on 04/11/2022.  However, review of the record shows that he had left lower quadrant abdominal pain at that time with some dysuria.  His urinalysis was negative for pyuria.  He was not started on any antibiotics, but subsequently he saw his PCP and was started on ciprofloxacin on 04/22/2022.  He has not had any fevers, chills, chest pain, shortness breath, headache, neck pain.  He has complained of nausea and vomiting for the past 3 days.  There is no hematemesis, diarrhea, hematochezia, melena.  He was initially admitted and started on treatment for acute pancreatitis on 04/24/22.  He left AMA on 04/25/22.  He returned to the ED on 04/26/22 with persistent abdominal pain that worsened again within 2 hours of returning home radiating to the back.    ED Course:  In the emergency department, he was hemodynamically stable, BP was 163/103, other vital signs were within normal range.  Work-up in the ED showed leukocytosis, thrombocytosis, normocytic anemia, hyponatremia, hyperglycemia, BUN 28/2.06 (creatinine is within baseline range).  Lipase 391, call level was less than 10.  Blood culture ending. CT abdomen and pelvis with contrast done on 5/17 showed heterogeneous enlargement of the pancreatic head with ill-defined fluid collections and surrounding inflammatory changes consistent with acute pancreatitis and acute peripancreatic fluid collections Patient was treated with IV Dilaudid 1 mg x 1, Zofran was given and patient was started on IV hydration.  Hospitalist was asked to admit  patient for further evaluation and management.     Assessment and Plan: Acute alcoholic pancreatitis Lipase peaked at 522 Patient endorsed drinking up to four x 40 ounces beers daily 5/18 RUQ US--gallbladder sludge without cholecystitis.  Hepatic steatosis. Clear liquid diet for now Triglycerides 231 Judicious opioids  Thrombocytosis Likely acute phase reactant Check iron studies  Uncontrolled type 2 diabetes mellitus with hyperglycemia, without long-term current use of insulin (HCC) 04/25/2022 hemoglobin A1c 7.7 NovoLog sliding scale Holding Jardiance  Acute renal failure superimposed on stage 3a chronic kidney disease (HCC) Baseline creatinine 1.2-1.5 Patient presented with serum creatinine 2.26 Secondary to volume depletion Continue IV fluids  ETOH abuse Alcohol withdrawal protocol  Depression Continue Lexapro  Essential hypertension Continue clonidine patch, amlodipine Add metoprolol  Mixed hyperlipidemia Continue statin  Tobacco abuse Tobacco cessation discussed NicoDerm patch     Family Communication:   no Family at bedside  Consultants:  none  Code Status:  FULL   DVT Prophylaxis:  [place] Lovenox   Procedures: As Listed in Progress Note Above  Antibiotics: None        Subjective: Complains of sob abd pain, a little better than yesterday.  Denies f/c, cp, sob, n/v/d.  Objective: Vitals:   04/26/22 1103 04/26/22 1248 04/26/22 1349 04/26/22 1659  BP: (!) 188/117 (!) 160/97 (!) 191/118 (!) 188/112  Pulse: (!) 104 (!) 102 (!) 109 (!) 102  Resp:   19 16  Temp: 100 F (37.8 C)  99.3 F (37.4 C) 99.9 F (37.7 C)  TempSrc:    Oral  SpO2: 97%  96% 99%  Weight:      Height:        Intake/Output Summary (Last 24 hours) at 04/26/2022 1747 Last data filed at 04/26/2022 1738 Gross per 24 hour  Intake 590.28 ml  Output 500 ml  Net 90.28 ml   Weight change:  Exam:  General:  Pt is alert, follows commands appropriately, not in acute  distress HEENT: No icterus, No thrush, No neck mass, [place]/AT Cardiovascular: RRR, S1/S2, no rubs, no gallops Respiratory: bibasilar rales. No wheeze Abdomen: Soft/+BS, upper abd tender, non distended, no guarding Extremities: No edema, No lymphangitis, No petechiae, No rashes, no synovitis   Data Reviewed: I have personally reviewed following labs and imaging studies Basic Metabolic Panel: Recent Labs  Lab 04/24/22 0727 04/26/22 0121  NA 133* 133*  K 4.7 3.8  CL 95* 98  CO2 25 23  GLUCOSE 186* 228*  BUN 38* 28*  CREATININE 2.26* 2.06*  CALCIUM 9.5 9.0  MG  --  2.2  PHOS  --  2.9   Liver Function Tests: Recent Labs  Lab 04/24/22 0727 04/26/22 0121  AST 31 29  ALT 36 30  ALKPHOS 41 41  BILITOT 1.5* 0.2*  PROT 8.8* 7.9  ALBUMIN 3.6 3.1*   Recent Labs  Lab 04/24/22 0727 04/26/22 0121  LIPASE 522* 391*   No results for input(s): AMMONIA in the last 168 hours. Coagulation Profile: No results for input(s): INR, PROTIME in the last 168 hours. CBC: Recent Labs  Lab 04/24/22 0727 04/26/22 0132  WBC 15.5* 12.5*  NEUTROABS 12.8* 10.5*  HGB 15.1 12.7*  HCT 43.6 37.6*  MCV 93.0 95.2  PLT 474* 408*   Cardiac Enzymes: No results for input(s): CKTOTAL, CKMB, CKMBINDEX, TROPONINI in the last 168 hours. BNP: Invalid input(s): POCBNP CBG: Recent Labs  Lab 04/26/22 0806 04/26/22 1227 04/26/22 1618  GLUCAP 189* 189* 189*   HbA1C: Recent Labs    04/25/22 0628  HGBA1C 7.7*   Urine analysis:    Component Value Date/Time   COLORURINE YELLOW 04/24/2022 0728   APPEARANCEUR HAZY (A) 04/24/2022 0728   LABSPEC 1.022 04/24/2022 0728   PHURINE 5.0 04/24/2022 0728   GLUCOSEU >=500 (A) 04/24/2022 0728   HGBUR NEGATIVE 04/24/2022 0728   BILIRUBINUR NEGATIVE 04/24/2022 0728   KETONESUR NEGATIVE 04/24/2022 0728   PROTEINUR 100 (A) 04/24/2022 0728   UROBILINOGEN 0.2 09/22/2015 0750   NITRITE NEGATIVE 04/24/2022 0728   LEUKOCYTESUR NEGATIVE 04/24/2022 0728   Sepsis  Labs: '@LABRCNTIP'$ (procalcitonin:4,lacticidven:4) ) Recent Results (from the past 240 hour(s))  Culture, blood (Routine X 2) w Reflex to ID Panel     Status: None (Preliminary result)   Collection Time: 04/24/22 12:51 PM   Specimen: BLOOD LEFT HAND  Result Value Ref Range Status   Specimen Description BLOOD LEFT HAND  Final   Special Requests   Final    BOTTLES DRAWN AEROBIC AND ANAEROBIC Blood Culture results may not be optimal due to an inadequate volume of blood received in culture bottles   Culture   Final    NO GROWTH 2 DAYS Performed at [facility], [address], [place], [place] [address]    Report Status PENDING  Incomplete  Culture, blood (Routine X 2) w Reflex to ID Panel     Status: None (Preliminary result)   Collection Time: 04/24/22 12:51 PM   Specimen: BLOOD RIGHT HAND  Result Value Ref Range Status   Specimen Description BLOOD RIGHT HAND  Final   Special Requests   Final  BOTTLES DRAWN AEROBIC AND ANAEROBIC Blood Culture results may not be optimal due to an inadequate volume of blood received in culture bottles   Culture   Final    NO GROWTH 2 DAYS Performed at [facility], [address], [place], [place] [address]    Report Status PENDING  Incomplete     Scheduled Meds:  amLODipine  10 mg Oral Daily   atorvastatin  80 mg Oral Daily   enoxaparin (LOVENOX) injection  40 mg Subcutaneous Q24H   escitalopram  10 mg Oral Daily   feeding supplement (GLUCERNA SHAKE)  237 mL Oral TID BM   fenofibrate  160 mg Oral Daily   folic acid  1 mg Oral Daily   icosapent Ethyl  2 g Oral BID   insulin aspart  0-5 Units Subcutaneous QHS   insulin aspart  0-9 Units Subcutaneous TID WC   metoprolol tartrate  25 mg Oral BID   multivitamin with minerals  1 tablet Oral Daily   nicotine  21 mg Transdermal Daily   pantoprazole  40 mg Oral Daily   thiamine  100 mg Oral Daily   Or   thiamine  100 mg Intravenous Daily   Continuous Infusions:  lactated ringers       Procedures/Studies: CT HEAD WO CONTRAST (5MM)  Result Date: 04/26/2022 CLINICAL DATA:  Fall, head trauma EXAM: CT HEAD WITHOUT CONTRAST TECHNIQUE: Contiguous axial images were obtained from the base of the skull through the vertex without intravenous contrast. RADIATION DOSE REDUCTION: This exam was performed according to the departmental dose-optimization program which includes automated exposure control, adjustment of the mA and/or kV according to patient size and/or use of iterative reconstruction technique. COMPARISON:  CT head 03/22/2018 FINDINGS: Brain: No acute intracranial hemorrhage, mass effect, or herniation. No extra-axial fluid collections. No evidence of acute territorial infarct. No hydrocephalus. 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 Possible old lacunar infarct in the right thalamus. Patchy hypodensities throughout the periventricular and subcortical white matter, likely secondary to chronic microvascular ischemic changes. Mild cortical volume loss. Vascular: Calcified plaques in the carotid siphons. Skull: Normal. Negative for fracture or focal lesion. Sinuses/Orbits: No acute finding. Other: None. IMPRESSION: Multiple chronic findings as described with no acute intracranial process identified. Electronically Signed   By: Ofilia Neas M.D.   On: 04/26/2022 12:01   CT ABDOMEN PELVIS W CONTRAST  Result Date: 04/24/2022 CLINICAL DATA:  Epigastric/right upper quadrant tenderness with nausea and vomiting. No bowel movement for 3 days. EXAM: CT ABDOMEN AND PELVIS WITH CONTRAST TECHNIQUE: Multidetector CT imaging of the abdomen and pelvis was performed using the standard protocol following bolus administration of intravenous contrast. RADIATION DOSE REDUCTION: This exam was performed according to the departmental dose-optimization program which includes automated exposure control, adjustment of the mA  and/or kV according to patient size and/or use of iterative reconstruction technique. CONTRAST:  54m OMNIPAQUE IOHEXOL 300 MG/ML  SOLN COMPARISON:  None Available. FINDINGS: Lower chest: Clear lung bases. No significant pleural or pericardial effusion. Atherosclerosis of the aorta and coronary arteries. Small hiatal hernia. Hepatobiliary: The liver is normal in density without suspicious focal abnormality. No evidence of gallstones, gallbladder wall thickening or biliary dilatation. Pancreas: The pancreatic head is enlarged with heterogeneous low-density fluid collections measuring up to 4.5 x 3.1 cm on image 34/2. There is a smaller fluid collection within the pancreatic body measuring 1.3 cm on image 25/2. There are inflammatory changes and ill-defined  fluid collections surrounding the pancreatic head and extending inferiorly along the right pararenal fascia. No evidence of pancreatic hemorrhage or necrosis. No pancreatic ductal dilatation. Spleen: Normal in size without focal abnormality. Adrenals/Urinary Tract: Both adrenal glands appear normal. Tiny nonobstructing calculus in the lower pole of the right kidney. No evidence of ureteral calculus or hydronephrosis. A well-circumscribed 1.4 cm lesion in the anterior suprahilar lip of the right kidney measures higher than water density (36 HU) and is indeterminate.The kidneys otherwise appear unremarkable. The urinary bladder is mildly distended without focal abnormality. Stomach/Bowel: No enteric contrast administered. The stomach appears unremarkable for its degree of distension. No evidence of bowel wall thickening, distention or surrounding inflammatory change. The appendix appears normal. Mildly prominent stool throughout the colon. Vascular/Lymphatic: There are no enlarged abdominal or pelvic lymph nodes. Diffuse aortic and branch vessel atherosclerosis without aneurysm. The portal, superior mesenteric and splenic veins are patent. Reproductive: The prostate  gland and seminal vesicles appear normal. Other: As above, peripancreatic inflammatory changes with inferior extension along the right anterior pararenal fascia and base of the mesentery. No free air or ascites. Musculoskeletal: No acute or significant osseous findings. Mild lumbar spine facet arthropathy. IMPRESSION: 1. Heterogeneous enlargement of the pancreatic head with ill-defined fluid collections and surrounding inflammatory changes consistent with acute pancreatitis and acute peripancreatic fluid collections in this patient with an elevated serum lipase level. No evidence of pancreatic necrosis or hemorrhage. 2. No evidence of bowel obstruction or focal bowel lesion. 3. Indeterminate right renal lesion, statistically a mildly complex cyst which measures higher than water density. Consider nonemergent renal ultrasound for further characterization. Alternatively, if follow up abdominal CT is planned to address the patient's pancreatitis, inclusion of pre contrast images through the kidneys could be performed to better characterize this lesion. 4. Coronary and Aortic Atherosclerosis (ICD10-I70.0). Electronically Signed   By: Richardean Sale M.D.   On: 04/24/2022 11:34   US Abdomen Limited  Result Date: 04/26/2022 CLINICAL DATA:  Pancreatitis. EXAM: ULTRASOUND ABDOMEN LIMITED RIGHT UPPER QUADRANT COMPARISON:  CT 04/24/2022 FINDINGS: Gallbladder: Moderate gallbladder sludge. No gallstones or wall thickening. Negative sonographic Murphy sign. No adjacent free fluid. Common bile duct: Diameter: 5.0 mm. Liver: Diffuse increased parenchymal echogenicity compatible with steatosis. No focal liver mass. Portal vein is patent on color Doppler imaging with normal direction of blood flow towards the liver. Other: No free fluid. IMPRESSION: 1. Moderate gallbladder sludge without additional sonographic evidence to suggest acute cholecystitis. 2.  Hepatic steatosis without focal mass. Electronically Signed   By: Marin Olp M.D.   On: 04/26/2022 08:57    [name], DO  [name]  If 7PM-7AM, please contact night-coverage [email or web address] Password [email or web address] 04/26/2022, 5:47 PM   LOS: 0 days

## 2022-04-26 NOTE — Assessment & Plan Note (Signed)
Alcohol withdrawal protocol --no signs of withdrawal

## 2022-04-26 NOTE — ED Notes (Signed)
Gave urinal.

## 2022-04-26 NOTE — ED Notes (Signed)
Hospitalist in room.

## 2022-04-26 NOTE — Assessment & Plan Note (Addendum)
Continue clonidine patch, amlodipine Restart labetolol Added hydralazine

## 2022-04-26 NOTE — ED Notes (Signed)
Pt was getting ready to be transferred up to the third floor, NT was in the room assisting him on the side of the bed and NT stated "He was on the side of the bed and leaned forward too fast and fell to the floor, I helped him to the floor and he lightly bumped his head on the bedside table" Upon my inspection, no apparent injuries noted. A &O x 4. Pt states "I am fine". Neurological assessment at baseline--MD made aware

## 2022-04-26 NOTE — Assessment & Plan Note (Addendum)
Tobacco cessation discussed NicoDerm patch 

## 2022-04-26 NOTE — Assessment & Plan Note (Addendum)
Hold statin temporarily due to elevated LFTs>>restart Triglycerides 231

## 2022-04-26 NOTE — ED Triage Notes (Signed)
Pt arrives [facility] from home c/o continued abd pain. Pt admitted on 5/17 for acute pancreatitis and signed out AMA on 5/18. Pt states he couldn't handle the pain anymore. Hx of chronic pain syndrome.

## 2022-04-27 DIAGNOSIS — F1010 Alcohol abuse, uncomplicated: Secondary | ICD-10-CM | POA: Diagnosis not present

## 2022-04-27 DIAGNOSIS — K852 Alcohol induced acute pancreatitis without necrosis or infection: Secondary | ICD-10-CM

## 2022-04-27 DIAGNOSIS — E782 Mixed hyperlipidemia: Secondary | ICD-10-CM | POA: Diagnosis not present

## 2022-04-27 DIAGNOSIS — N179 Acute kidney failure, unspecified: Secondary | ICD-10-CM | POA: Diagnosis not present

## 2022-04-27 LAB — COMPREHENSIVE METABOLIC PANEL
ALT: 42 U/L (ref 0–44)
AST: 42 U/L — ABNORMAL HIGH (ref 15–41)
Albumin: 2.8 g/dL — ABNORMAL LOW (ref 3.5–5.0)
Alkaline Phosphatase: 86 U/L (ref 38–126)
Anion gap: 9 (ref 5–15)
BUN: 21 mg/dL — ABNORMAL HIGH (ref 6–20)
CO2: 23 mmol/L (ref 22–32)
Calcium: 8.9 mg/dL (ref 8.9–10.3)
Chloride: 105 mmol/L (ref 98–111)
Creatinine, Ser: 1.54 mg/dL — ABNORMAL HIGH (ref 0.61–1.24)
GFR, Estimated: 54 mL/min — ABNORMAL LOW (ref >60)
Glucose, Bld: 139 mg/dL — ABNORMAL HIGH (ref 70–99)
Potassium: 4 mmol/L (ref 3.5–5.1)
Sodium: 137 mmol/L (ref 135–145)
Total Bilirubin: 1.2 mg/dL (ref 0.3–1.2)
Total Protein: 7 g/dL (ref 6.5–8.1)

## 2022-04-27 LAB — CBC
HCT: 35.5 % — ABNORMAL LOW (ref 39.0–52.0)
Hemoglobin: 12 g/dL — ABNORMAL LOW (ref 13.0–17.0)
MCH: 31.9 pg (ref 26.0–34.0)
MCHC: 33.8 g/dL (ref 30.0–36.0)
MCV: 94.4 fL (ref 80.0–100.0)
Platelets: 412 10*3/uL — ABNORMAL HIGH (ref 150–400)
RBC: 3.76 MIL/uL — ABNORMAL LOW (ref 4.22–5.81)
RDW: 13.2 % (ref 11.5–15.5)
WBC: 12.1 10*3/uL — ABNORMAL HIGH (ref 4.0–10.5)
nRBC: 0 % (ref 0.0–0.2)

## 2022-04-27 LAB — IRON AND TIBC
Iron: 32 ug/dL — ABNORMAL LOW (ref 45–182)
Saturation Ratios: 15 % — ABNORMAL LOW (ref 17.9–39.5)
TIBC: 213 ug/dL — ABNORMAL LOW (ref 250–450)
UIBC: 181 ug/dL

## 2022-04-27 LAB — GLUCOSE, CAPILLARY
Glucose-Capillary: 183 mg/dL — ABNORMAL HIGH (ref 70–99)
Glucose-Capillary: 157 mg/dL — ABNORMAL HIGH (ref 70–99)
Glucose-Capillary: 183 mg/dL — ABNORMAL HIGH (ref 70–99)
Glucose-Capillary: 115 mg/dL — ABNORMAL HIGH (ref 70–99)

## 2022-04-27 LAB — FERRITIN: Ferritin: 557 ng/mL — ABNORMAL HIGH (ref 24–336)

## 2022-04-27 MED ORDER — BISACODYL 10 MG RE SUPP
10.0000 mg | Freq: Once | RECTAL | Status: AC
  Administered 2022-04-27: 10 mg via RECTAL
  Filled 2022-04-27: qty 1

## 2022-04-27 MED ORDER — HYDRALAZINE HCL 25 MG PO TABS
50.0000 mg | ORAL_TABLET | Freq: Two times a day (BID) | ORAL | Status: DC
  Administered 2022-04-27 – 2022-04-30 (×6): 50 mg via ORAL
  Filled 2022-04-27 (×6): qty 2

## 2022-04-27 MED ORDER — POLYETHYLENE GLYCOL 3350 17 G PO PACK
17.0000 g | PACK | Freq: Every day | ORAL | Status: DC
  Administered 2022-04-27 – 2022-04-28 (×2): 17 g via ORAL
  Filled 2022-04-27: qty 1

## 2022-04-27 MED ORDER — SENNA 8.6 MG PO TABS
2.0000 | ORAL_TABLET | Freq: Every day | ORAL | Status: DC
  Administered 2022-04-27 – 2022-04-28 (×2): 17.2 mg via ORAL
  Filled 2022-04-27 (×2): qty 2

## 2022-04-27 NOTE — Progress Notes (Signed)
PROGRESS NOTE  Taylor Obrien ESP:[id number] DOB: [birth year] DOA: 04/26/2022 PCP: [facility]  Brief History:   56 year old male with a history of hypertension, hyperlipidemia, diabetes mellitus, depression, GERD, tobacco abuse presenting with upper abdominal pain for at least the past 4 days.  He has a difficult historian at best.  He states that he has had this abdominal pain since he last visited the ED on 04/11/2022.  However, review of the record shows that he had left lower quadrant abdominal pain at that time with some dysuria.  His urinalysis was negative for pyuria.  He was not started on any antibiotics, but subsequently he saw his PCP and was started on ciprofloxacin on 04/22/2022.  He has not had any fevers, chills, chest pain, shortness breath, headache, neck pain.  He has complained of nausea and vomiting for the past 3 days.  There is no hematemesis, diarrhea, hematochezia, melena.  He was initially admitted and started on treatment for acute pancreatitis on 04/24/22.  He left AMA on 04/25/22.  He returned to the ED on 04/26/22 with persistent abdominal pain that worsened again within 2 hours of returning home radiating to the back.    ED Course:  In the emergency department, he was hemodynamically stable, BP was 163/103, other vital signs were within normal range.  Work-up in the ED showed leukocytosis, thrombocytosis, normocytic anemia, hyponatremia, hyperglycemia, BUN 28/2.06 (creatinine is within baseline range).  Lipase 391, call level was less than 10.  Blood culture ending. CT abdomen and pelvis with contrast done on 5/17 showed heterogeneous enlargement of the pancreatic head with ill-defined fluid collections and surrounding inflammatory changes consistent with acute pancreatitis and acute peripancreatic fluid collections Patient was treated with IV Dilaudid 1 mg x 1, Zofran was given and patient was started on IV hydration.  Hospitalist was asked to admit  patient for further evaluation and management.     Assessment and Plan: Acute alcoholic pancreatitis Lipase peaked at 522 Patient endorsed drinking up to four x 40 ounces beers daily 5/18 RUQ US--gallbladder sludge without cholecystitis.  Hepatic steatosis. Advance to full liquids Triglycerides 231 Judicious opioids  Thrombocytosis Likely acute phase reactant Iron saturation 15 Ferritin 557  Uncontrolled type 2 diabetes mellitus with hyperglycemia, without long-term current use of insulin (HCC) 04/25/2022 hemoglobin A1c 7.7 NovoLog sliding scale Holding Jardiance  Acute renal failure superimposed on stage 3a chronic kidney disease (HCC) Baseline creatinine 1.2-1.5 Patient presented with serum creatinine 2.26 Secondary to volume depletion Continue IV fluids>>improved  ETOH abuse Alcohol withdrawal protocol --no signs of withdrawal  Depression Continue Lexapro  Essential hypertension Continue clonidine patch, amlodipine Restart labetolol Add hydralazine  Mixed hyperlipidemia Continue statin Triglycerides 231  Tobacco abuse Tobacco cessation discussed NicoDerm patch   Family Communication:   spouse updated 5/20   Consultants:  none   Code Status:  FULL    DVT Prophylaxis:  [place] Lovenox     Procedures: As Listed in Progress Note Above   Antibiotics: None      Subjective: Abd pain is improving.  Denies f/,c, cp, sob, n/v/d, dysuria  Objective: Vitals:   04/27/22 0013 04/27/22 0455 04/27/22 0819 04/27/22 1321  BP: (!) 154/87 (!) 173/93 (!) 170/102 (!) 153/95  Pulse:  91 83 87  Resp:  20  17  Temp:  99.3 F (37.4 C)  99.4 F (37.4 C)  TempSrc:  Oral  Oral  SpO2:  96%  100%  Weight:  Height:        Intake/Output Summary (Last 24 hours) at 04/27/2022 1623 Last data filed at 04/27/2022 1300 Gross per 24 hour  Intake 2588.08 ml  Output 2500 ml  Net 88.08 ml   Weight change:  Exam:  General:  Pt is alert, follows commands  appropriately, not in acute distress HEENT: No icterus, No thrush, No neck mass, [place]/AT Cardiovascular: RRR, S1/S2, no rubs, no gallops Respiratory: bibasilar rales. No wheeze Abdomen: Soft/+BS, mild upper tender, non distended, no guarding Extremities: No edema, No lymphangitis, No petechiae, No rashes, no synovitis   Data Reviewed: I have personally reviewed following labs and imaging studies Basic Metabolic Panel: Recent Labs  Lab 04/24/22 0727 04/26/22 0121 04/27/22 0512  NA 133* 133* 137  K 4.7 3.8 4.0  CL 95* 98 105  CO2 '25 23 23  '$ GLUCOSE 186* 228* 139*  BUN 38* 28* 21*  CREATININE 2.26* 2.06* 1.54*  CALCIUM 9.5 9.0 8.9  MG  --  2.2  --   PHOS  --  2.9  --    Liver Function Tests: Recent Labs  Lab 04/24/22 0727 04/26/22 0121 04/27/22 0512  AST 31 29 42*  ALT 36 30 42  ALKPHOS 41 41 86  BILITOT 1.5* 0.2* 1.2  PROT 8.8* 7.9 7.0  ALBUMIN 3.6 3.1* 2.8*   Recent Labs  Lab 04/24/22 0727 04/26/22 0121  LIPASE 522* 391*   No results for input(s): AMMONIA in the last 168 hours. Coagulation Profile: No results for input(s): INR, PROTIME in the last 168 hours. CBC: Recent Labs  Lab 04/24/22 0727 04/26/22 0132 04/27/22 0512  WBC 15.5* 12.5* 12.1*  NEUTROABS 12.8* 10.5*  --   HGB 15.1 12.7* 12.0*  HCT 43.6 37.6* 35.5*  MCV 93.0 95.2 94.4  PLT 474* 408* 412*   Cardiac Enzymes: No results for input(s): CKTOTAL, CKMB, CKMBINDEX, TROPONINI in the last 168 hours. BNP: Invalid input(s): POCBNP CBG: Recent Labs  Lab 04/26/22 1618 04/26/22 2002 04/27/22 0729 04/27/22 1103 04/27/22 1601  GLUCAP 189* 164* 183* 157* 183*   HbA1C: Recent Labs    04/25/22 0628  HGBA1C 7.7*   Urine analysis:    Component Value Date/Time   COLORURINE YELLOW 04/24/2022 0728   APPEARANCEUR HAZY (A) 04/24/2022 0728   LABSPEC 1.022 04/24/2022 0728   PHURINE 5.0 04/24/2022 0728   GLUCOSEU >=500 (A) 04/24/2022 0728   HGBUR NEGATIVE 04/24/2022 0728   BILIRUBINUR NEGATIVE  04/24/2022 0728   KETONESUR NEGATIVE 04/24/2022 0728   PROTEINUR 100 (A) 04/24/2022 0728   UROBILINOGEN 0.2 09/22/2015 0750   NITRITE NEGATIVE 04/24/2022 0728   LEUKOCYTESUR NEGATIVE 04/24/2022 0728   Sepsis Labs: '@LABRCNTIP'$ (procalcitonin:4,lacticidven:4) ) Recent Results (from the past 240 hour(s))  Culture, blood (Routine X 2) w Reflex to ID Panel     Status: None (Preliminary result)   Collection Time: 04/24/22 12:51 PM   Specimen: BLOOD LEFT HAND  Result Value Ref Range Status   Specimen Description BLOOD LEFT HAND  Final   Special Requests   Final    BOTTLES DRAWN AEROBIC AND ANAEROBIC Blood Culture results may not be optimal due to an inadequate volume of blood received in culture bottles   Culture   Final    NO GROWTH 3 DAYS Performed at [facility], [address] [place], [place] [address]    Report Status PENDING  Incomplete  Culture, blood (Routine X 2) w Reflex to ID Panel     Status: None (Preliminary result)   Collection Time:  04/24/22 12:51 PM   Specimen: BLOOD RIGHT HAND  Result Value Ref Range Status   Specimen Description BLOOD RIGHT HAND  Final   Special Requests   Final    BOTTLES DRAWN AEROBIC AND ANAEROBIC Blood Culture results may not be optimal due to an inadequate volume of blood received in culture bottles   Culture   Final    NO GROWTH 3 DAYS Performed at [facility], [address], [place], [place] [address]    Report Status PENDING  Incomplete     Scheduled Meds:  amLODipine  10 mg Oral Daily   atorvastatin  80 mg Oral Daily   enoxaparin (LOVENOX) injection  40 mg Subcutaneous Q24H   escitalopram  10 mg Oral Daily   feeding supplement (GLUCERNA SHAKE)  237 mL Oral TID BM   fenofibrate  160 mg Oral Daily   folic acid  1 mg Oral Daily   hydrALAZINE  50 mg Oral BID   icosapent Ethyl  2 g Oral BID   insulin aspart  0-5 Units Subcutaneous QHS   insulin aspart  0-9 Units Subcutaneous TID WC   labetalol  300 mg Oral BID   multivitamin with  minerals  1 tablet Oral Daily   nicotine  21 mg Transdermal Daily   pantoprazole  40 mg Oral Daily   polyethylene glycol  17 g Oral Daily   senna  2 tablet Oral Daily   thiamine  100 mg Oral Daily   Or   thiamine  100 mg Intravenous Daily   traZODone  100 mg Oral QHS   Continuous Infusions:  lactated ringers 125 mL/hr at 04/27/22 1150    Procedures/Studies: CT HEAD WO CONTRAST (5MM)  Result Date: 04/26/2022 CLINICAL DATA:  Fall, head trauma EXAM: CT HEAD WITHOUT CONTRAST TECHNIQUE: Contiguous axial images were obtained from the base of the skull through the vertex without intravenous contrast. RADIATION DOSE REDUCTION: This exam was performed according to the departmental dose-optimization program which includes automated exposure control, adjustment of the mA and/or kV according to patient size and/or use of iterative reconstruction technique. COMPARISON:  CT head 03/22/2018 FINDINGS: Brain: No acute intracranial hemorrhage, mass effect, or herniation. No extra-axial fluid collections. No evidence of acute territorial infarct. No hydrocephalus. 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 Possible old lacunar infarct in the right thalamus. Patchy hypodensities throughout the periventricular and subcortical white matter, likely secondary to chronic microvascular ischemic changes. Mild cortical volume loss. Vascular: Calcified plaques in the carotid siphons. Skull: Normal. Negative for fracture or focal lesion. Sinuses/Orbits: No acute finding. Other: None. IMPRESSION: Multiple chronic findings as described with no acute intracranial process identified. Electronically Signed   By: Ofilia Neas M.D.   On: 04/26/2022 12:01   CT ABDOMEN PELVIS W CONTRAST  Result Date: 04/24/2022 CLINICAL DATA:  Epigastric/right upper quadrant tenderness with nausea and vomiting. No bowel movement for 3 days. EXAM: CT ABDOMEN AND  PELVIS WITH CONTRAST TECHNIQUE: Multidetector CT imaging of the abdomen and pelvis was performed using the standard protocol following bolus administration of intravenous contrast. RADIATION DOSE REDUCTION: This exam was performed according to the departmental dose-optimization program which includes automated exposure control, adjustment of the mA and/or kV according to patient size and/or use of iterative reconstruction technique. CONTRAST:  32m OMNIPAQUE IOHEXOL 300 MG/ML  SOLN COMPARISON:  None Available. FINDINGS: Lower chest: Clear lung bases. No significant pleural or pericardial effusion. Atherosclerosis of the aorta and  coronary arteries. Small hiatal hernia. Hepatobiliary: The liver is normal in density without suspicious focal abnormality. No evidence of gallstones, gallbladder wall thickening or biliary dilatation. Pancreas: The pancreatic head is enlarged with heterogeneous low-density fluid collections measuring up to 4.5 x 3.1 cm on image 34/2. There is a smaller fluid collection within the pancreatic body measuring 1.3 cm on image 25/2. There are inflammatory changes and ill-defined fluid collections surrounding the pancreatic head and extending inferiorly along the right pararenal fascia. No evidence of pancreatic hemorrhage or necrosis. No pancreatic ductal dilatation. Spleen: Normal in size without focal abnormality. Adrenals/Urinary Tract: Both adrenal glands appear normal. Tiny nonobstructing calculus in the lower pole of the right kidney. No evidence of ureteral calculus or hydronephrosis. A well-circumscribed 1.4 cm lesion in the anterior suprahilar lip of the right kidney measures higher than water density (36 HU) and is indeterminate.The kidneys otherwise appear unremarkable. The urinary bladder is mildly distended without focal abnormality. Stomach/Bowel: No enteric contrast administered. The stomach appears unremarkable for its degree of distension. No evidence of bowel wall thickening,  distention or surrounding inflammatory change. The appendix appears normal. Mildly prominent stool throughout the colon. Vascular/Lymphatic: There are no enlarged abdominal or pelvic lymph nodes. Diffuse aortic and branch vessel atherosclerosis without aneurysm. The portal, superior mesenteric and splenic veins are patent. Reproductive: The prostate gland and seminal vesicles appear normal. Other: As above, peripancreatic inflammatory changes with inferior extension along the right anterior pararenal fascia and base of the mesentery. No free air or ascites. Musculoskeletal: No acute or significant osseous findings. Mild lumbar spine facet arthropathy. IMPRESSION: 1. Heterogeneous enlargement of the pancreatic head with ill-defined fluid collections and surrounding inflammatory changes consistent with acute pancreatitis and acute peripancreatic fluid collections in this patient with an elevated serum lipase level. No evidence of pancreatic necrosis or hemorrhage. 2. No evidence of bowel obstruction or focal bowel lesion. 3. Indeterminate right renal lesion, statistically a mildly complex cyst which measures higher than water density. Consider nonemergent renal ultrasound for further characterization. Alternatively, if follow up abdominal CT is planned to address the patient's pancreatitis, inclusion of pre contrast images through the kidneys could be performed to better characterize this lesion. 4. Coronary and Aortic Atherosclerosis (ICD10-I70.0). Electronically Signed   By: Richardean Sale M.D.   On: 04/24/2022 11:34   US Abdomen Limited  Result Date: 04/26/2022 CLINICAL DATA:  Pancreatitis. EXAM: ULTRASOUND ABDOMEN LIMITED RIGHT UPPER QUADRANT COMPARISON:  CT 04/24/2022 FINDINGS: Gallbladder: Moderate gallbladder sludge. No gallstones or wall thickening. Negative sonographic Murphy sign. No adjacent free fluid. Common bile duct: Diameter: 5.0 mm. Liver: Diffuse increased parenchymal echogenicity compatible  with steatosis. No focal liver mass. Portal vein is patent on color Doppler imaging with normal direction of blood flow towards the liver. Other: No free fluid. IMPRESSION: 1. Moderate gallbladder sludge without additional sonographic evidence to suggest acute cholecystitis. 2.  Hepatic steatosis without focal mass. Electronically Signed   By: Marin Olp M.D.   On: 04/26/2022 08:57    [name], DO  [name]  If 7PM-7AM, please contact night-coverage [email or web address] Password [email or web address] 04/27/2022, 4:23 PM   LOS: 1 day

## 2022-04-27 NOTE — Plan of Care (Signed)

## 2022-04-28 DIAGNOSIS — K859 Acute pancreatitis without necrosis or infection, unspecified: Secondary | ICD-10-CM | POA: Diagnosis not present

## 2022-04-28 DIAGNOSIS — F1010 Alcohol abuse, uncomplicated: Secondary | ICD-10-CM | POA: Diagnosis not present

## 2022-04-28 DIAGNOSIS — K852 Alcohol induced acute pancreatitis without necrosis or infection: Secondary | ICD-10-CM | POA: Diagnosis not present

## 2022-04-28 DIAGNOSIS — N179 Acute kidney failure, unspecified: Secondary | ICD-10-CM | POA: Diagnosis not present

## 2022-04-28 LAB — CBC
HCT: 31.9 % — ABNORMAL LOW (ref 39.0–52.0)
Hemoglobin: 10.8 g/dL — ABNORMAL LOW (ref 13.0–17.0)
MCH: 32.1 pg (ref 26.0–34.0)
MCHC: 33.9 g/dL (ref 30.0–36.0)
MCV: 94.9 fL (ref 80.0–100.0)
Platelets: 361 10*3/uL (ref 150–400)
RBC: 3.36 MIL/uL — ABNORMAL LOW (ref 4.22–5.81)
RDW: 13.2 % (ref 11.5–15.5)
WBC: 9.8 10*3/uL (ref 4.0–10.5)
nRBC: 0 % (ref 0.0–0.2)

## 2022-04-28 LAB — GLUCOSE, CAPILLARY
Glucose-Capillary: 126 mg/dL — ABNORMAL HIGH (ref 70–99)
Glucose-Capillary: 166 mg/dL — ABNORMAL HIGH (ref 70–99)
Glucose-Capillary: 155 mg/dL — ABNORMAL HIGH (ref 70–99)
Glucose-Capillary: 156 mg/dL — ABNORMAL HIGH (ref 70–99)

## 2022-04-28 LAB — BASIC METABOLIC PANEL
Anion gap: 7 (ref 5–15)
BUN: 19 mg/dL (ref 6–20)
CO2: 24 mmol/L (ref 22–32)
Calcium: 8.7 mg/dL — ABNORMAL LOW (ref 8.9–10.3)
Chloride: 108 mmol/L (ref 98–111)
Creatinine, Ser: 1.58 mg/dL — ABNORMAL HIGH (ref 0.61–1.24)
GFR, Estimated: 52 mL/min — ABNORMAL LOW (ref >60)
Glucose, Bld: 126 mg/dL — ABNORMAL HIGH (ref 70–99)
Potassium: 3.8 mmol/L (ref 3.5–5.1)
Sodium: 139 mmol/L (ref 135–145)

## 2022-04-28 MED ORDER — PANTOPRAZOLE SODIUM 40 MG PO TBEC
40.0000 mg | DELAYED_RELEASE_TABLET | Freq: Two times a day (BID) | ORAL | Status: DC
Start: 2022-04-28 — End: 2022-04-30
  Administered 2022-04-28 – 2022-04-30 (×4): 40 mg via ORAL
  Filled 2022-04-28 (×4): qty 1

## 2022-04-28 NOTE — Progress Notes (Signed)
Patient vomited. Vomit was brown with a speck of blood. Patient just had a glucerna that he drank really fast. The vomit was the color of the glucerna. MD Tat notified. Patient given Zofran.

## 2022-04-28 NOTE — Progress Notes (Signed)
PROGRESS NOTE  Taylor Obrien ZOX:[id number] DOB: 1966/08/05 DOA: 04/26/2022 PCP: [facility]  Brief History:   56 year old male with a history of hypertension, hyperlipidemia, diabetes mellitus, depression, GERD, tobacco abuse presenting with upper abdominal pain for at least the past 4 days.  He has a difficult historian at best.  He states that he has had this abdominal pain since he last visited the ED on 04/11/2022.  However, review of the record shows that he had left lower quadrant abdominal pain at that time with some dysuria.  His urinalysis was negative for pyuria.  He was not started on any antibiotics, but subsequently he saw his PCP and was started on ciprofloxacin on 04/22/2022.  He has not had any fevers, chills, chest pain, shortness breath, headache, neck pain.  He has complained of nausea and vomiting for the past 3 days.  There is no hematemesis, diarrhea, hematochezia, melena.  He was initially admitted and started on treatment for acute pancreatitis on 04/24/22.  He left AMA on 04/25/22.  He returned to the ED on 04/26/22 with persistent abdominal pain that worsened again within 2 hours of returning home radiating to the back.    ED Course:  In the emergency department, he was hemodynamically stable, BP was 163/103, other vital signs were within normal range.  Work-up in the ED showed leukocytosis, thrombocytosis, normocytic anemia, hyponatremia, hyperglycemia, BUN 28/2.06 (creatinine is within baseline range).  Lipase 391, call level was less than 10.  Blood culture ending. CT abdomen and pelvis with contrast done on 5/17 showed heterogeneous enlargement of the pancreatic head with ill-defined fluid collections and surrounding inflammatory changes consistent with acute pancreatitis and acute peripancreatic fluid collections Patient was treated with IV Dilaudid 1 mg x 1, Zofran was given and patient was started on IV hydration.  Hospitalist was asked to admit  patient for further evaluation and management.     Assessment and Plan: Acute alcoholic pancreatitis Lipase peaked at 522 Patient endorsed drinking up to four x 40 ounces beers daily 5/18 RUQ US--gallbladder sludge without cholecystitis.  Hepatic steatosis. Advance to full liquids>>remain on full liquid 5/21 as pt had one episode n/v Triglycerides 231 Judicious opioids  Thrombocytosis Likely acute phase reactant Iron saturation 15 Ferritin 557  Uncontrolled type 2 diabetes mellitus with hyperglycemia, without long-term current use of insulin (HCC) 04/25/2022 hemoglobin A1c 7.7 NovoLog sliding scale Holding Jardiance  Acute renal failure superimposed on stage 3a chronic kidney disease (HCC) Baseline creatinine 1.2-1.5 Patient presented with serum creatinine 2.26 Secondary to volume depletion Continue IV fluids>>improved  ETOH abuse Alcohol withdrawal protocol --no signs of withdrawal  Depression Continue Lexapro  Essential hypertension Continue clonidine patch, amlodipine Restart labetolol Added hydralazine  Mixed hyperlipidemia Continue statin Triglycerides 231  Tobacco abuse Tobacco cessation discussed NicoDerm patch   Family Communication:   spouse updated 5/20   Consultants:  none   Code Status:  FULL    DVT Prophylaxis:  [place] Lovenox     Procedures: As Listed in Progress Note Above   Antibiotics: None        Subjective: Patient had 1 episode of emesis today.  This occurred after he drank 1 bottle of Glucerna quickly.  He denies any additional vomiting.  Denies any worsening abdominal pain.  In fact he states that his abdominal pain is a bit better.  He denies any chest pain, shortness of breath, fevers, chills.  Objective: Vitals:   04/28/22  0000 04/28/22 0600 04/28/22 0801 04/28/22 1310  BP: 120/76 (!) 175/90 (!) 164/94 115/66  Pulse: 83 77 81 84  Resp: '16 19  17  '$ Temp: 99.1 F (37.3 C) 98.1 F (36.7 C)  98.4 F (36.9 C)  TempSrc:  Oral Oral    SpO2: 93% 94%  94%  Weight:      Height:        Intake/Output Summary (Last 24 hours) at 04/28/2022 1711 Last data filed at 04/28/2022 1330 Gross per 24 hour  Intake 1590 ml  Output 2700 ml  Net -1110 ml   Weight change:  Exam:  General:  Pt is alert, follows commands appropriately, not in acute distress HEENT: No icterus, No thrush, No neck mass, [place]/AT Cardiovascular: RRR, S1/S2, no rubs, no gallops Respiratory: Bibasilar rales.  No wheezing.  Good air movement Abdomen: Soft/+BS, upper abdomen tender, non distended, no guarding Extremities: No edema, No lymphangitis, No petechiae, No rashes, no synovitis   Data Reviewed: I have personally reviewed following labs and imaging studies Basic Metabolic Panel: Recent Labs  Lab 04/24/22 0727 04/26/22 0121 04/27/22 0512 04/28/22 0540  NA 133* 133* 137 139  K 4.7 3.8 4.0 3.8  CL 95* 98 105 108  CO2 '25 23 23 24  '$ GLUCOSE 186* 228* 139* 126*  BUN 38* 28* 21* 19  CREATININE 2.26* 2.06* 1.54* 1.58*  CALCIUM 9.5 9.0 8.9 8.7*  MG  --  2.2  --   --   PHOS  --  2.9  --   --    Liver Function Tests: Recent Labs  Lab 04/24/22 0727 04/26/22 0121 04/27/22 0512  AST 31 29 42*  ALT 36 30 42  ALKPHOS 41 41 86  BILITOT 1.5* 0.2* 1.2  PROT 8.8* 7.9 7.0  ALBUMIN 3.6 3.1* 2.8*   Recent Labs  Lab 04/24/22 0727 04/26/22 0121  LIPASE 522* 391*   No results for input(s): AMMONIA in the last 168 hours. Coagulation Profile: No results for input(s): INR, PROTIME in the last 168 hours. CBC: Recent Labs  Lab 04/24/22 0727 04/26/22 0132 04/27/22 0512 04/28/22 0540  WBC 15.5* 12.5* 12.1* 9.8  NEUTROABS 12.8* 10.5*  --   --   HGB 15.1 12.7* 12.0* 10.8*  HCT 43.6 37.6* 35.5* 31.9*  MCV 93.0 95.2 94.4 94.9  PLT 474* 408* 412* 361   Cardiac Enzymes: No results for input(s): CKTOTAL, CKMB, CKMBINDEX, TROPONINI in the last 168 hours. BNP: Invalid input(s): POCBNP CBG: Recent Labs  Lab 04/27/22 1601 04/27/22 2144  04/28/22 0714 04/28/22 1141 04/28/22 1556  GLUCAP 183* 115* 126* 166* 155*   HbA1C: No results for input(s): HGBA1C in the last 72 hours. Urine analysis:    Component Value Date/Time   COLORURINE YELLOW 04/24/2022 0728   APPEARANCEUR HAZY (A) 04/24/2022 0728   LABSPEC 1.022 04/24/2022 0728   PHURINE 5.0 04/24/2022 0728   GLUCOSEU >=500 (A) 04/24/2022 0728   HGBUR NEGATIVE 04/24/2022 0728   BILIRUBINUR NEGATIVE 04/24/2022 0728   KETONESUR NEGATIVE 04/24/2022 0728   PROTEINUR 100 (A) 04/24/2022 0728   UROBILINOGEN 0.2 09/22/2015 0750   NITRITE NEGATIVE 04/24/2022 0728   LEUKOCYTESUR NEGATIVE 04/24/2022 0728   Sepsis Labs: '@LABRCNTIP'$ (procalcitonin:4,lacticidven:4) ) Recent Results (from the past 240 hour(s))  Culture, blood (Routine X 2) w Reflex to ID Panel     Status: None (Preliminary result)   Collection Time: 04/24/22 12:51 PM   Specimen: BLOOD LEFT HAND  Result Value Ref Range Status   Specimen Description BLOOD LEFT HAND  Final   Special Requests   Final    BOTTLES DRAWN AEROBIC AND ANAEROBIC Blood Culture results may not be optimal due to an inadequate volume of blood received in culture bottles   Culture   Final    NO GROWTH 4 DAYS Performed at [facility], [address] [place], [place] [address]    Report Status PENDING  Incomplete  Culture, blood (Routine X 2) w Reflex to ID Panel     Status: None (Preliminary result)   Collection Time: 04/24/22 12:51 PM   Specimen: BLOOD RIGHT HAND  Result Value Ref Range Status   Specimen Description BLOOD RIGHT HAND  Final   Special Requests   Final    BOTTLES DRAWN AEROBIC AND ANAEROBIC Blood Culture results may not be optimal due to an inadequate volume of blood received in culture bottles   Culture   Final    NO GROWTH 4 DAYS Performed at [facility], [address], [place], [place] [address]    Report Status PENDING  Incomplete     Scheduled Meds:  amLODipine  10 mg Oral Daily   atorvastatin  80 mg Oral Daily    enoxaparin (LOVENOX) injection  40 mg Subcutaneous Q24H   escitalopram  10 mg Oral Daily   feeding supplement (GLUCERNA SHAKE)  237 mL Oral TID BM   fenofibrate  160 mg Oral Daily   folic acid  1 mg Oral Daily   hydrALAZINE  50 mg Oral BID   icosapent Ethyl  2 g Oral BID   insulin aspart  0-5 Units Subcutaneous QHS   insulin aspart  0-9 Units Subcutaneous TID WC   labetalol  300 mg Oral BID   multivitamin with minerals  1 tablet Oral Daily   nicotine  21 mg Transdermal Daily   pantoprazole  40 mg Oral Daily   polyethylene glycol  17 g Oral Daily   senna  2 tablet Oral Daily   thiamine  100 mg Oral Daily   Or   thiamine  100 mg Intravenous Daily   traZODone  100 mg Oral QHS   Continuous Infusions:  lactated ringers 125 mL/hr at 04/28/22 1300    Procedures/Studies: CT HEAD WO CONTRAST (5MM)  Result Date: 04/26/2022 CLINICAL DATA:  Fall, head trauma EXAM: CT HEAD WITHOUT CONTRAST TECHNIQUE: Contiguous axial images were obtained from the base of the skull through the vertex without intravenous contrast. RADIATION DOSE REDUCTION: This exam was performed according to the departmental dose-optimization program which includes automated exposure control, adjustment of the mA and/or kV according to patient size and/or use of iterative reconstruction technique. COMPARISON:  CT head 03/22/2018 FINDINGS: Brain: No acute intracranial hemorrhage, mass effect, or herniation. No extra-axial fluid collections. No evidence of acute territorial infarct. No hydrocephalus. 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 Possible old lacunar infarct in the right thalamus. Patchy hypodensities throughout the periventricular and subcortical white matter, likely secondary to chronic microvascular ischemic changes. Mild cortical volume loss. Vascular: Calcified plaques in the carotid siphons. Skull: Normal. Negative for fracture or focal  lesion. Sinuses/Orbits: No acute finding. Other: None. IMPRESSION: Multiple chronic findings as described with no acute intracranial process identified. Electronically Signed   By: Ofilia Neas M.D.   On: 04/26/2022 12:01   CT ABDOMEN PELVIS W CONTRAST  Result Date: 04/24/2022 CLINICAL DATA:  Epigastric/right upper quadrant tenderness with nausea and vomiting. No bowel movement for 3 days. EXAM: CT ABDOMEN  AND PELVIS WITH CONTRAST TECHNIQUE: Multidetector CT imaging of the abdomen and pelvis was performed using the standard protocol following bolus administration of intravenous contrast. RADIATION DOSE REDUCTION: This exam was performed according to the departmental dose-optimization program which includes automated exposure control, adjustment of the mA and/or kV according to patient size and/or use of iterative reconstruction technique. CONTRAST:  16m OMNIPAQUE IOHEXOL 300 MG/ML  SOLN COMPARISON:  None Available. FINDINGS: Lower chest: Clear lung bases. No significant pleural or pericardial effusion. Atherosclerosis of the aorta and coronary arteries. Small hiatal hernia. Hepatobiliary: The liver is normal in density without suspicious focal abnormality. No evidence of gallstones, gallbladder wall thickening or biliary dilatation. Pancreas: The pancreatic head is enlarged with heterogeneous low-density fluid collections measuring up to 4.5 x 3.1 cm on image 34/2. There is a smaller fluid collection within the pancreatic body measuring 1.3 cm on image 25/2. There are inflammatory changes and ill-defined fluid collections surrounding the pancreatic head and extending inferiorly along the right pararenal fascia. No evidence of pancreatic hemorrhage or necrosis. No pancreatic ductal dilatation. Spleen: Normal in size without focal abnormality. Adrenals/Urinary Tract: Both adrenal glands appear normal. Tiny nonobstructing calculus in the lower pole of the right kidney. No evidence of ureteral calculus or  hydronephrosis. A well-circumscribed 1.4 cm lesion in the anterior suprahilar lip of the right kidney measures higher than water density (36 HU) and is indeterminate.The kidneys otherwise appear unremarkable. The urinary bladder is mildly distended without focal abnormality. Stomach/Bowel: No enteric contrast administered. The stomach appears unremarkable for its degree of distension. No evidence of bowel wall thickening, distention or surrounding inflammatory change. The appendix appears normal. Mildly prominent stool throughout the colon. Vascular/Lymphatic: There are no enlarged abdominal or pelvic lymph nodes. Diffuse aortic and branch vessel atherosclerosis without aneurysm. The portal, superior mesenteric and splenic veins are patent. Reproductive: The prostate gland and seminal vesicles appear normal. Other: As above, peripancreatic inflammatory changes with inferior extension along the right anterior pararenal fascia and base of the mesentery. No free air or ascites. Musculoskeletal: No acute or significant osseous findings. Mild lumbar spine facet arthropathy. IMPRESSION: 1. Heterogeneous enlargement of the pancreatic head with ill-defined fluid collections and surrounding inflammatory changes consistent with acute pancreatitis and acute peripancreatic fluid collections in this patient with an elevated serum lipase level. No evidence of pancreatic necrosis or hemorrhage. 2. No evidence of bowel obstruction or focal bowel lesion. 3. Indeterminate right renal lesion, statistically a mildly complex cyst which measures higher than water density. Consider nonemergent renal ultrasound for further characterization. Alternatively, if follow up abdominal CT is planned to address the patient's pancreatitis, inclusion of pre contrast images through the kidneys could be performed to better characterize this lesion. 4. Coronary and Aortic Atherosclerosis (ICD10-I70.0). Electronically Signed   By: WRichardean SaleM.D.    On: 04/24/2022 11:34   [place]domen Limited  Result Date: 04/26/2022 CLINICAL DATA:  Pancreatitis. EXAM: ULTRASOUND ABDOMEN LIMITED RIGHT UPPER QUADRANT COMPARISON:  CT 04/24/2022 FINDINGS: Gallbladder: Moderate gallbladder sludge. No gallstones or wall thickening. Negative sonographic Murphy sign. No adjacent free fluid. Common bile duct: Diameter: 5.0 mm. Liver: Diffuse increased parenchymal echogenicity compatible with steatosis. No focal liver mass. Portal vein is patent on color Doppler imaging with normal direction of blood flow towards the liver. Other: No free fluid. IMPRESSION: 1. Moderate gallbladder sludge without additional sonographic evidence to suggest acute cholecystitis. 2.  Hepatic steatosis without focal mass. Electronically Signed   By: DMarin OlpM.D.   On: 04/26/2022 08:57  [name], DO  [name]  If 7PM-7AM, please contact night-coverage [email or web address] Password [email or web address] 04/28/2022, 5:11 PM   LOS: 2 days

## 2022-04-29 DIAGNOSIS — R7401 Elevation of levels of liver transaminase levels: Secondary | ICD-10-CM

## 2022-04-29 DIAGNOSIS — K852 Alcohol induced acute pancreatitis without necrosis or infection: Secondary | ICD-10-CM | POA: Diagnosis not present

## 2022-04-29 DIAGNOSIS — F1010 Alcohol abuse, uncomplicated: Secondary | ICD-10-CM | POA: Diagnosis not present

## 2022-04-29 DIAGNOSIS — I10 Essential (primary) hypertension: Secondary | ICD-10-CM | POA: Diagnosis not present

## 2022-04-29 DIAGNOSIS — E669 Obesity, unspecified: Secondary | ICD-10-CM

## 2022-04-29 DIAGNOSIS — N179 Acute kidney failure, unspecified: Secondary | ICD-10-CM | POA: Diagnosis not present

## 2022-04-29 LAB — CULTURE, BLOOD (ROUTINE X 2)
Culture: NO GROWTH
Culture: NO GROWTH

## 2022-04-29 LAB — COMPREHENSIVE METABOLIC PANEL
ALT: 90 U/L — ABNORMAL HIGH (ref 0–44)
AST: 91 U/L — ABNORMAL HIGH (ref 15–41)
Albumin: 2.5 g/dL — ABNORMAL LOW (ref 3.5–5.0)
Alkaline Phosphatase: 164 U/L — ABNORMAL HIGH (ref 38–126)
Anion gap: 8 (ref 5–15)
BUN: 18 mg/dL (ref 6–20)
CO2: 24 mmol/L (ref 22–32)
Calcium: 8.6 mg/dL — ABNORMAL LOW (ref 8.9–10.3)
Chloride: 106 mmol/L (ref 98–111)
Creatinine, Ser: 1.59 mg/dL — ABNORMAL HIGH (ref 0.61–1.24)
GFR, Estimated: 52 mL/min — ABNORMAL LOW (ref >60)
Glucose, Bld: 126 mg/dL — ABNORMAL HIGH (ref 70–99)
Potassium: 4 mmol/L (ref 3.5–5.1)
Sodium: 138 mmol/L (ref 135–145)
Total Bilirubin: 1.3 mg/dL — ABNORMAL HIGH (ref 0.3–1.2)
Total Protein: 6.4 g/dL — ABNORMAL LOW (ref 6.5–8.1)

## 2022-04-29 LAB — GLUCOSE, CAPILLARY
Glucose-Capillary: 128 mg/dL — ABNORMAL HIGH (ref 70–99)
Glucose-Capillary: 118 mg/dL — ABNORMAL HIGH (ref 70–99)
Glucose-Capillary: 143 mg/dL — ABNORMAL HIGH (ref 70–99)
Glucose-Capillary: 106 mg/dL — ABNORMAL HIGH (ref 70–99)

## 2022-04-29 LAB — CBC
HCT: 33.2 % — ABNORMAL LOW (ref 39.0–52.0)
Hemoglobin: 10.8 g/dL — ABNORMAL LOW (ref 13.0–17.0)
MCH: 31.1 pg (ref 26.0–34.0)
MCHC: 32.5 g/dL (ref 30.0–36.0)
MCV: 95.7 fL (ref 80.0–100.0)
Platelets: 377 10*3/uL (ref 150–400)
RBC: 3.47 MIL/uL — ABNORMAL LOW (ref 4.22–5.81)
RDW: 13.3 % (ref 11.5–15.5)
WBC: 12.6 10*3/uL — ABNORMAL HIGH (ref 4.0–10.5)
nRBC: 0 % (ref 0.0–0.2)

## 2022-04-29 LAB — LIPASE, BLOOD: Lipase: 391 U/L — ABNORMAL HIGH (ref 11–51)

## 2022-04-29 LAB — HEPATITIS B SURFACE ANTIGEN: Hepatitis B Surface Ag: NONREACTIVE

## 2022-04-29 LAB — HEPATITIS C ANTIBODY: HCV Ab: NONREACTIVE

## 2022-04-29 NOTE — Assessment & Plan Note (Signed)
BMI 34.96 Lifestyle modification

## 2022-04-29 NOTE — Assessment & Plan Note (Addendum)
5/19 RUQ US--Moderate gallbladder sludge without additional sonographic evidence to suggest acute cholecystitis; hepatic steatosis Hold lipitor and fenofibrate Hep B surface antigen Hep C antibody Now improved>>restart statin

## 2022-04-29 NOTE — Discharge Summary (Signed)
Physician Discharge Summary   Patient: Taylor Obrien MRN: [id number] DOB: 12-08-1966  Admit date:     04/26/2022  Discharge date: {dischdate:26783}  Discharge Physician: Shanon Brow Miracle Mongillo   PCP: [facility]   Recommendations at discharge:   Please follow up with primary care provider within 1-2 weeks  Please repeat BMP and CBC in one week    Hospital Course:  56 year old male with a history of hypertension, hyperlipidemia, diabetes mellitus, depression, GERD, tobacco abuse presenting with upper abdominal pain for at least the past 4 days.  He has a difficult historian at best.  He states that he has had this abdominal pain since he last visited the ED on 04/11/2022.  However, review of the record shows that he had left lower quadrant abdominal pain at that time with some dysuria.  His urinalysis was negative for pyuria.  He was not started on any antibiotics, but subsequently he saw his PCP and was started on ciprofloxacin on 04/22/2022.  He has not had any fevers, chills, chest pain, shortness breath, headache, neck pain.  He has complained of nausea and vomiting for the past 3 days.  There is no hematemesis, diarrhea, hematochezia, melena.  He was initially admitted and started on treatment for acute pancreatitis on 04/24/22.  He left AMA on 04/25/22.  He returned to the ED on 04/26/22 with persistent abdominal pain that worsened again within 2 hours of returning home radiating to the back.    ED Course:  In the emergency department, he was hemodynamically stable, BP was 163/103, other vital signs were within normal range.  Work-up in the ED showed leukocytosis, thrombocytosis, normocytic anemia, hyponatremia, hyperglycemia, BUN 28/2.06 (creatinine is within baseline range).  Lipase 391, call level was less than 10.  Blood culture ending. CT abdomen and pelvis with contrast done on 5/17 showed heterogeneous enlargement of the pancreatic head with ill-defined fluid collections and surrounding  inflammatory changes consistent with acute pancreatitis and acute peripancreatic fluid collections Patient was treated with IV Dilaudid 1 mg x 1, Zofran was given and patient was started on IV hydration.  Hospitalist was asked to admit patient for further evaluation and management.  Assessment and Plan: * Acute alcoholic pancreatitis Lipase peaked at 522 Patient endorsed drinking up to four x 40 ounces beers daily 5/18 RUQ US--gallbladder sludge without cholecystitis.  Hepatic steatosis. 5/22--advance to soft diet which the patient tolerated Triglycerides 231 Judicious opioids  Transaminasemia 5/19 RUQ US--Moderate gallbladder sludge without additional sonographic evidence to suggest acute cholecystitis; hepatic steatosis Hold lipitor and fenofibrate Hep B surface antigen Hep C antibody Now improved>>restart statin  Mixed hyperlipidemia Hold statin temporarily due to elevated LFTs>>restart Triglycerides 231  Acute renal failure superimposed on stage 3a chronic kidney disease ([place]) Baseline creatinine 1.2-1.5 Patient presented with serum creatinine 2.26 Secondary to volume depletion Continue IV fluids>>improved Serum creatinine  ETOH abuse Alcohol withdrawal protocol --no signs of withdrawal  Uncontrolled type 2 diabetes mellitus with hyperglycemia, without long-term current use of insulin (HCC) 04/25/2022 hemoglobin A1c 7.7 NovoLog sliding scale Holding Jardiance>>restart after d/c Restart metformin after d/c  Class 1 obesity BMI 34.96 Lifestyle modification  Thrombocytosis Likely acute phase reactant Iron saturation 15 Ferritin 557  Depression Continue Lexapro  Essential hypertension Continue clonidine patch, amlodipine Restart labetolol Added hydralazine  Tobacco abuse Tobacco cessation discussed NicoDerm patch      {Tip this will not be part of the note when signed Body mass index is 34.96 kg/m. , ,  (Optional):26781}  {(NOTE)  Pain control PDMP  Statment (Optional):26782} Consultants: *** Procedures performed: ***  Disposition: {Plan; Disposition:26390} Diet recommendation:  {Diet_Plan:26776} DISCHARGE MEDICATION: Allergies as of 04/29/2022       Reactions   Oxcarbazepine Other (See Comments)   Patient goes out of right state of mind.      Med Rec must be completed prior to using this [facility]***       Discharge Exam: Filed Weights   04/26/22 0112  Weight: 95.3 kg   ***  Condition at discharge: {DC Condition:26389}  The results of significant diagnostics from this hospitalization (including imaging, microbiology, ancillary and laboratory) are listed below for reference.   Imaging Studies: CT HEAD WO CONTRAST (5MM)  Result Date: 04/26/2022 CLINICAL DATA:  Fall, head trauma EXAM: CT HEAD WITHOUT CONTRAST TECHNIQUE: Contiguous axial images were obtained from the base of the skull through the vertex without intravenous contrast. RADIATION DOSE REDUCTION: This exam was performed according to the departmental dose-optimization program which includes automated exposure control, adjustment of the mA and/or kV according to patient size and/or use of iterative reconstruction technique. COMPARISON:  CT head 03/22/2018 FINDINGS: Brain: No acute intracranial hemorrhage, mass effect, or herniation. No extra-axial fluid collections. No evidence of acute territorial infarct. No hydrocephalus. 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 Possible old lacunar infarct in the right thalamus. Patchy hypodensities throughout the periventricular and subcortical white matter, likely secondary to chronic microvascular ischemic changes. Mild cortical volume loss. Vascular: Calcified plaques in the carotid siphons. Skull: Normal. Negative for fracture or focal lesion. Sinuses/Orbits: No acute finding. Other: None. IMPRESSION: Multiple chronic findings as described with no  acute intracranial process identified. Electronically Signed   By: Ofilia Neas M.D.   On: 04/26/2022 12:01   CT ABDOMEN PELVIS W CONTRAST  Result Date: 04/24/2022 CLINICAL DATA:  Epigastric/right upper quadrant tenderness with nausea and vomiting. No bowel movement for 3 days. EXAM: CT ABDOMEN AND PELVIS WITH CONTRAST TECHNIQUE: Multidetector CT imaging of the abdomen and pelvis was performed using the standard protocol following bolus administration of intravenous contrast. RADIATION DOSE REDUCTION: This exam was performed according to the departmental dose-optimization program which includes automated exposure control, adjustment of the mA and/or kV according to patient size and/or use of iterative reconstruction technique. CONTRAST:  103m OMNIPAQUE IOHEXOL 300 MG/ML  SOLN COMPARISON:  None Available. FINDINGS: Lower chest: Clear lung bases. No significant pleural or pericardial effusion. Atherosclerosis of the aorta and coronary arteries. Small hiatal hernia. Hepatobiliary: The liver is normal in density without suspicious focal abnormality. No evidence of gallstones, gallbladder wall thickening or biliary dilatation. Pancreas: The pancreatic head is enlarged with heterogeneous low-density fluid collections measuring up to 4.5 x 3.1 cm on image 34/2. There is a smaller fluid collection within the pancreatic body measuring 1.3 cm on image 25/2. There are inflammatory changes and ill-defined fluid collections surrounding the pancreatic head and extending inferiorly along the right pararenal fascia. No evidence of pancreatic hemorrhage or necrosis. No pancreatic ductal dilatation. Spleen: Normal in size without focal abnormality. Adrenals/Urinary Tract: Both adrenal glands appear normal. Tiny nonobstructing calculus in the lower pole of the right kidney. No evidence of ureteral calculus or hydronephrosis. A well-circumscribed 1.4 cm lesion in the anterior suprahilar lip of the right kidney measures higher  than water density (36 HU) and is indeterminate.The kidneys otherwise appear unremarkable. The urinary bladder is mildly distended without focal abnormality. Stomach/Bowel: No enteric contrast administered. The stomach appears unremarkable for  its degree of distension. No evidence of bowel wall thickening, distention or surrounding inflammatory change. The appendix appears normal. Mildly prominent stool throughout the colon. Vascular/Lymphatic: There are no enlarged abdominal or pelvic lymph nodes. Diffuse aortic and branch vessel atherosclerosis without aneurysm. The portal, superior mesenteric and splenic veins are patent. Reproductive: The prostate gland and seminal vesicles appear normal. Other: As above, peripancreatic inflammatory changes with inferior extension along the right anterior pararenal fascia and base of the mesentery. No free air or ascites. Musculoskeletal: No acute or significant osseous findings. Mild lumbar spine facet arthropathy. IMPRESSION: 1. Heterogeneous enlargement of the pancreatic head with ill-defined fluid collections and surrounding inflammatory changes consistent with acute pancreatitis and acute peripancreatic fluid collections in this patient with an elevated serum lipase level. No evidence of pancreatic necrosis or hemorrhage. 2. No evidence of bowel obstruction or focal bowel lesion. 3. Indeterminate right renal lesion, statistically a mildly complex cyst which measures higher than water density. Consider nonemergent renal ultrasound for further characterization. Alternatively, if follow up abdominal CT is planned to address the patient's pancreatitis, inclusion of pre contrast images through the kidneys could be performed to better characterize this lesion. 4. Coronary and Aortic Atherosclerosis (ICD10-I70.0). Electronically Signed   By: Richardean Sale M.D.   On: 04/24/2022 11:34   US Abdomen Limited  Result Date: 04/26/2022 CLINICAL DATA:  Pancreatitis. EXAM: ULTRASOUND  ABDOMEN LIMITED RIGHT UPPER QUADRANT COMPARISON:  CT 04/24/2022 FINDINGS: Gallbladder: Moderate gallbladder sludge. No gallstones or wall thickening. Negative sonographic Murphy sign. No adjacent free fluid. Common bile duct: Diameter: 5.0 mm. Liver: Diffuse increased parenchymal echogenicity compatible with steatosis. No focal liver mass. Portal vein is patent on color Doppler imaging with normal direction of blood flow towards the liver. Other: No free fluid. IMPRESSION: 1. Moderate gallbladder sludge without additional sonographic evidence to suggest acute cholecystitis. 2.  Hepatic steatosis without focal mass. Electronically Signed   By: Marin Olp M.D.   On: 04/26/2022 08:57    Microbiology: Results for orders placed or performed during the hospital encounter of 04/24/22  Culture, blood (Routine X 2) w Reflex to ID Panel     Status: None   Collection Time: 04/24/22 12:51 PM   Specimen: BLOOD LEFT HAND  Result Value Ref Range Status   Specimen Description BLOOD LEFT HAND  Final   Special Requests   Final    BOTTLES DRAWN AEROBIC AND ANAEROBIC Blood Culture results may not be optimal due to an inadequate volume of blood received in culture bottles   Culture   Final    NO GROWTH 5 DAYS Performed at [facility], [address], [place], [place] [address]    Report Status 04/29/2022 FINAL  Final  Culture, blood (Routine X 2) w Reflex to ID Panel     Status: None   Collection Time: 04/24/22 12:51 PM   Specimen: BLOOD RIGHT HAND  Result Value Ref Range Status   Specimen Description BLOOD RIGHT HAND  Final   Special Requests   Final    BOTTLES DRAWN AEROBIC AND ANAEROBIC Blood Culture results may not be optimal due to an inadequate volume of blood received in culture bottles   Culture   Final    NO GROWTH 5 DAYS Performed at [facility], [address], [place], [place] [address]    Report Status 04/29/2022 FINAL  Final    Labs: CBC: Recent Labs  Lab 04/24/22 0727 04/26/22 0132  04/27/22 0512 04/28/22 0540 04/29/22 0235  WBC 15.5* 12.5* 12.1*  9.8 12.6*  NEUTROABS 12.8* 10.5*  --   --   --   HGB 15.1 12.7* 12.0* 10.8* 10.8*  HCT 43.6 37.6* 35.5* 31.9* 33.2*  MCV 93.0 95.2 94.4 94.9 95.7  PLT 474* 408* 412* 361 165   Basic Metabolic Panel: Recent Labs  Lab 04/24/22 0727 04/26/22 0121 04/27/22 0512 04/28/22 0540 04/29/22 0235  NA 133* 133* 137 139 138  K 4.7 3.8 4.0 3.8 4.0  CL 95* 98 105 108 106  CO2 '25 23 23 24 24  '$ GLUCOSE 186* 228* 139* 126* 126*  BUN 38* 28* 21* 19 18  CREATININE 2.26* 2.06* 1.54* 1.58* 1.59*  CALCIUM 9.5 9.0 8.9 8.7* 8.6*  MG  --  2.2  --   --   --   PHOS  --  2.9  --   --   --    Liver Function Tests: Recent Labs  Lab 04/24/22 0727 04/26/22 0121 04/27/22 0512 04/29/22 0235  AST 31 29 42* 91*  ALT 36 30 42 90*  ALKPHOS 41 41 86 164*  BILITOT 1.5* 0.2* 1.2 1.3*  PROT 8.8* 7.9 7.0 6.4*  ALBUMIN 3.6 3.1* 2.8* 2.5*   CBG: Recent Labs  Lab 04/28/22 1556 04/28/22 2041 04/29/22 0739 04/29/22 1126 04/29/22 1630  GLUCAP 155* 156* 128* 118* 143*    Discharge time spent: {LESS THAN/GREATER THAN:26388} 30 minutes.  Signed: [name], MD [name] 04/29/2022

## 2022-04-29 NOTE — Progress Notes (Signed)
PROGRESS NOTE  Taylor Obrien EUM:[id number] DOB: Feb 17, 1966 DOA: 04/26/2022 PCP: [facility]  Brief History:   56 year old male with a history of hypertension, hyperlipidemia, diabetes mellitus, depression, GERD, tobacco abuse presenting with upper abdominal pain for at least the past 4 days.  He has a difficult historian at best.  He states that he has had this abdominal pain since he last visited the ED on 04/11/2022.  However, review of the record shows that he had left lower quadrant abdominal pain at that time with some dysuria.  His urinalysis was negative for pyuria.  He was not started on any antibiotics, but subsequently he saw his PCP and was started on ciprofloxacin on 04/22/2022.  He has not had any fevers, chills, chest pain, shortness breath, headache, neck pain.  He has complained of nausea and vomiting for the past 3 days.  There is no hematemesis, diarrhea, hematochezia, melena.  He was initially admitted and started on treatment for acute pancreatitis on 04/24/22.  He left AMA on 04/25/22.  He returned to the ED on 04/26/22 with persistent abdominal pain that worsened again within 2 hours of returning home radiating to the back.    ED Course:  In the emergency department, he was hemodynamically stable, BP was 163/103, other vital signs were within normal range.  Work-up in the ED showed leukocytosis, thrombocytosis, normocytic anemia, hyponatremia, hyperglycemia, BUN 28/2.06 (creatinine is within baseline range).  Lipase 391, call level was less than 10.  Blood culture ending. CT abdomen and pelvis with contrast done on 5/17 showed heterogeneous enlargement of the pancreatic head with ill-defined fluid collections and surrounding inflammatory changes consistent with acute pancreatitis and acute peripancreatic fluid collections Patient was treated with IV Dilaudid 1 mg x 1, Zofran was given and patient was started on IV hydration.  Hospitalist was asked to admit  patient for further evaluation and management.    Assessment and Plan: * Acute alcoholic pancreatitis Lipase peaked at 522 Patient endorsed drinking up to four x 40 ounces beers daily 5/18 RUQ US--gallbladder sludge without cholecystitis.  Hepatic steatosis. 5/22--advance to soft diet Triglycerides 231 Judicious opioids  Transaminasemia 5/19 RUQ US--Moderate gallbladder sludge without additional sonographic evidence to suggest acute cholecystitis; hepatic steatosis Hold lipitor and fenofibrate Hep B surface antigen Hep C antibody  Mixed hyperlipidemia Hold statin temporarily due to elevated LFTs Triglycerides 231  Acute renal failure superimposed on stage 3a chronic kidney disease ([place]) Baseline creatinine 1.2-1.5 Patient presented with serum creatinine 2.26 Secondary to volume depletion Continue IV fluids>>improved  ETOH abuse Alcohol withdrawal protocol --no signs of withdrawal  Uncontrolled type 2 diabetes mellitus with hyperglycemia, without long-term current use of insulin (HCC) 04/25/2022 hemoglobin A1c 7.7 NovoLog sliding scale Holding Jardiance  Thrombocytosis Likely acute phase reactant Iron saturation 15 Ferritin 557  Depression Continue Lexapro  Essential hypertension Continue clonidine patch, amlodipine Restart labetolol Added hydralazine  Tobacco abuse Tobacco cessation discussed NicoDerm patch   Family Communication:   spouse updated 5/20   Consultants:  none   Code Status:  FULL    DVT Prophylaxis:  [place] Lovenox     Procedures: As Listed in Progress Note Above   Antibiotics: None          Subjective: Patient is feeling better.  No n/v last 24 hours.  Abdominal pain is improving.  Denies cp. Sob, abd pain.  Having loose stool.  No hematochezia or melena  Objective: Vitals:  04/29/22 0442 04/29/22 1108 04/29/22 1151 04/29/22 1254  BP: 133/77 (!) 161/98  122/87  Pulse: 80 (!) 117 84 94  Resp: 18   16  Temp: 97.9 F (36.6  C)     TempSrc:      SpO2: 100%   98%  Weight:      Height:        Intake/Output Summary (Last 24 hours) at 04/29/2022 1506 Last data filed at 04/29/2022 1300 Gross per 24 hour  Intake 480 ml  Output 2500 ml  Net -2020 ml   Weight change:  Exam:  General:  Pt is alert, follows commands appropriately, not in acute distress HEENT: No icterus, No thrush, No neck mass, [place]/AT Cardiovascular: RRR, S1/S2, no rubs, no gallops Respiratory: diminished BS.  No wheeze Abdomen: Soft/+BS, non tender, non distended, no guarding Extremities: No edema, No lymphangitis, No petechiae, No rashes, no synovitis   Data Reviewed: I have personally reviewed following labs and imaging studies Basic Metabolic Panel: Recent Labs  Lab 04/24/22 0727 04/26/22 0121 04/27/22 0512 04/28/22 0540 04/29/22 0235  NA 133* 133* 137 139 138  K 4.7 3.8 4.0 3.8 4.0  CL 95* 98 105 108 106  CO2 '25 23 23 24 24  '$ GLUCOSE 186* 228* 139* 126* 126*  BUN 38* 28* 21* 19 18  CREATININE 2.26* 2.06* 1.54* 1.58* 1.59*  CALCIUM 9.5 9.0 8.9 8.7* 8.6*  MG  --  2.2  --   --   --   PHOS  --  2.9  --   --   --    Liver Function Tests: Recent Labs  Lab 04/24/22 0727 04/26/22 0121 04/27/22 0512 04/29/22 0235  AST 31 29 42* 91*  ALT 36 30 42 90*  ALKPHOS 41 41 86 164*  BILITOT 1.5* 0.2* 1.2 1.3*  PROT 8.8* 7.9 7.0 6.4*  ALBUMIN 3.6 3.1* 2.8* 2.5*   Recent Labs  Lab 04/24/22 0727 04/26/22 0121  LIPASE 522* 391*   No results for input(s): AMMONIA in the last 168 hours. Coagulation Profile: No results for input(s): INR, PROTIME in the last 168 hours. CBC: Recent Labs  Lab 04/24/22 0727 04/26/22 0132 04/27/22 0512 04/28/22 0540 04/29/22 0235  WBC 15.5* 12.5* 12.1* 9.8 12.6*  NEUTROABS 12.8* 10.5*  --   --   --   HGB 15.1 12.7* 12.0* 10.8* 10.8*  HCT 43.6 37.6* 35.5* 31.9* 33.2*  MCV 93.0 95.2 94.4 94.9 95.7  PLT 474* 408* 412* 361 377   Cardiac Enzymes: No results for input(s): CKTOTAL, CKMB,  CKMBINDEX, TROPONINI in the last 168 hours. BNP: Invalid input(s): POCBNP CBG: Recent Labs  Lab 04/28/22 1141 04/28/22 1556 04/28/22 2041 04/29/22 0739 04/29/22 1126  GLUCAP 166* 155* 156* 128* 118*   HbA1C: No results for input(s): HGBA1C in the last 72 hours. Urine analysis:    Component Value Date/Time   COLORURINE YELLOW 04/24/2022 0728   APPEARANCEUR HAZY (A) 04/24/2022 0728   LABSPEC 1.022 04/24/2022 0728   PHURINE 5.0 04/24/2022 0728   GLUCOSEU >=500 (A) 04/24/2022 0728   HGBUR NEGATIVE 04/24/2022 0728   BILIRUBINUR NEGATIVE 04/24/2022 0728   KETONESUR NEGATIVE 04/24/2022 0728   PROTEINUR 100 (A) 04/24/2022 0728   UROBILINOGEN 0.2 09/22/2015 0750   NITRITE NEGATIVE 04/24/2022 0728   LEUKOCYTESUR NEGATIVE 04/24/2022 0728   Sepsis Labs: '@LABRCNTIP'$ (procalcitonin:4,lacticidven:4) ) Recent Results (from the past 240 hour(s))  Culture, blood (Routine X 2) w Reflex to ID Panel     Status: None   Collection Time:  04/24/22 12:51 PM   Specimen: BLOOD LEFT HAND  Result Value Ref Range Status   Specimen Description BLOOD LEFT HAND  Final   Special Requests   Final    BOTTLES DRAWN AEROBIC AND ANAEROBIC Blood Culture results may not be optimal due to an inadequate volume of blood received in culture bottles   Culture   Final    NO GROWTH 5 DAYS Performed at [facility], [address] [place], [place] [address]    Report Status 04/29/2022 FINAL  Final  Culture, blood (Routine X 2) w Reflex to ID Panel     Status: None   Collection Time: 04/24/22 12:51 PM   Specimen: BLOOD RIGHT HAND  Result Value Ref Range Status   Specimen Description BLOOD RIGHT HAND  Final   Special Requests   Final    BOTTLES DRAWN AEROBIC AND ANAEROBIC Blood Culture results may not be optimal due to an inadequate volume of blood received in culture bottles   Culture   Final    NO GROWTH 5 DAYS Performed at [facility], [address], [place], [place] [address]    Report Status 04/29/2022  FINAL  Final     Scheduled Meds:  amLODipine  10 mg Oral Daily   enoxaparin (LOVENOX) injection  40 mg Subcutaneous Q24H   escitalopram  10 mg Oral Daily   feeding supplement (GLUCERNA SHAKE)  237 mL Oral TID BM   folic acid  1 mg Oral Daily   hydrALAZINE  50 mg Oral BID   insulin aspart  0-5 Units Subcutaneous QHS   insulin aspart  0-9 Units Subcutaneous TID WC   labetalol  300 mg Oral BID   multivitamin with minerals  1 tablet Oral Daily   nicotine  21 mg Transdermal Daily   pantoprazole  40 mg Oral BID   thiamine  100 mg Oral Daily   Or   thiamine  100 mg Intravenous Daily   traZODone  100 mg Oral QHS   Continuous Infusions:  lactated ringers 125 mL/hr at 04/29/22 1317    Procedures/Studies: CT HEAD WO CONTRAST (5MM)  Result Date: 04/26/2022 CLINICAL DATA:  Fall, head trauma EXAM: CT HEAD WITHOUT CONTRAST TECHNIQUE: Contiguous axial images were obtained from the base of the skull through the vertex without intravenous contrast. RADIATION DOSE REDUCTION: This exam was performed according to the departmental dose-optimization program which includes automated exposure control, adjustment of the mA and/or kV according to patient size and/or use of iterative reconstruction technique. COMPARISON:  CT head 03/22/2018 FINDINGS: Brain: No acute intracranial hemorrhage, mass effect, or herniation. No extra-axial fluid collections. No evidence of acute territorial infarct. No hydrocephalus. 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 Possible old lacunar infarct in the right thalamus. Patchy hypodensities throughout the periventricular and subcortical white matter, likely secondary to chronic microvascular ischemic changes. Mild cortical volume loss. Vascular: Calcified plaques in the carotid siphons. Skull: Normal. Negative for fracture or focal lesion. Sinuses/Orbits: No acute finding. Other: None. IMPRESSION:  Multiple chronic findings as described with no acute intracranial process identified. Electronically Signed   By: Ofilia Neas M.D.   On: 04/26/2022 12:01   CT ABDOMEN PELVIS W CONTRAST  Result Date: 04/24/2022 CLINICAL DATA:  Epigastric/right upper quadrant tenderness with nausea and vomiting. No bowel movement for 3 days. EXAM: CT ABDOMEN AND PELVIS WITH CONTRAST TECHNIQUE: Multidetector CT imaging of the abdomen and pelvis was performed using the standard protocol  following bolus administration of intravenous contrast. RADIATION DOSE REDUCTION: This exam was performed according to the departmental dose-optimization program which includes automated exposure control, adjustment of the mA and/or kV according to patient size and/or use of iterative reconstruction technique. CONTRAST:  47m OMNIPAQUE IOHEXOL 300 MG/ML  SOLN COMPARISON:  None Available. FINDINGS: Lower chest: Clear lung bases. No significant pleural or pericardial effusion. Atherosclerosis of the aorta and coronary arteries. Small hiatal hernia. Hepatobiliary: The liver is normal in density without suspicious focal abnormality. No evidence of gallstones, gallbladder wall thickening or biliary dilatation. Pancreas: The pancreatic head is enlarged with heterogeneous low-density fluid collections measuring up to 4.5 x 3.1 cm on image 34/2. There is a smaller fluid collection within the pancreatic body measuring 1.3 cm on image 25/2. There are inflammatory changes and ill-defined fluid collections surrounding the pancreatic head and extending inferiorly along the right pararenal fascia. No evidence of pancreatic hemorrhage or necrosis. No pancreatic ductal dilatation. Spleen: Normal in size without focal abnormality. Adrenals/Urinary Tract: Both adrenal glands appear normal. Tiny nonobstructing calculus in the lower pole of the right kidney. No evidence of ureteral calculus or hydronephrosis. A well-circumscribed 1.4 cm lesion in the anterior  suprahilar lip of the right kidney measures higher than water density (36 HU) and is indeterminate.The kidneys otherwise appear unremarkable. The urinary bladder is mildly distended without focal abnormality. Stomach/Bowel: No enteric contrast administered. The stomach appears unremarkable for its degree of distension. No evidence of bowel wall thickening, distention or surrounding inflammatory change. The appendix appears normal. Mildly prominent stool throughout the colon. Vascular/Lymphatic: There are no enlarged abdominal or pelvic lymph nodes. Diffuse aortic and branch vessel atherosclerosis without aneurysm. The portal, superior mesenteric and splenic veins are patent. Reproductive: The prostate gland and seminal vesicles appear normal. Other: As above, peripancreatic inflammatory changes with inferior extension along the right anterior pararenal fascia and base of the mesentery. No free air or ascites. Musculoskeletal: No acute or significant osseous findings. Mild lumbar spine facet arthropathy. IMPRESSION: 1. Heterogeneous enlargement of the pancreatic head with ill-defined fluid collections and surrounding inflammatory changes consistent with acute pancreatitis and acute peripancreatic fluid collections in this patient with an elevated serum lipase level. No evidence of pancreatic necrosis or hemorrhage. 2. No evidence of bowel obstruction or focal bowel lesion. 3. Indeterminate right renal lesion, statistically a mildly complex cyst which measures higher than water density. Consider nonemergent renal ultrasound for further characterization. Alternatively, if follow up abdominal CT is planned to address the patient's pancreatitis, inclusion of pre contrast images through the kidneys could be performed to better characterize this lesion. 4. Coronary and Aortic Atherosclerosis (ICD10-I70.0). Electronically Signed   By: WRichardean SaleM.D.   On: 04/24/2022 11:34   [place]Abdomen Limited  Result Date:  04/26/2022 CLINICAL DATA:  Pancreatitis. EXAM: ULTRASOUND ABDOMEN LIMITED RIGHT UPPER QUADRANT COMPARISON:  CT 04/24/2022 FINDINGS: Gallbladder: Moderate gallbladder sludge. No gallstones or wall thickening. Negative sonographic Murphy sign. No adjacent free fluid. Common bile duct: Diameter: 5.0 mm. Liver: Diffuse increased parenchymal echogenicity compatible with steatosis. No focal liver mass. Portal vein is patent on color Doppler imaging with normal direction of blood flow towards the liver. Other: No free fluid. IMPRESSION: 1. Moderate gallbladder sludge without additional sonographic evidence to suggest acute cholecystitis. 2.  Hepatic steatosis without focal mass. Electronically Signed   By: DMarin OlpM.D.   On: 04/26/2022 08:57    DOrson Eva DO  [name]  If 7PM-7AM, please contact night-coverage [email or web address] Password [facility]5/22/2023,  3:06 PM   LOS: 3 days

## 2022-04-30 ENCOUNTER — Inpatient Hospital Stay (HOSPITAL_COMMUNITY): Payer: Medicare Other

## 2022-04-30 ENCOUNTER — Encounter (HOSPITAL_COMMUNITY): Payer: Self-pay | Admitting: Internal Medicine

## 2022-04-30 DIAGNOSIS — E669 Obesity, unspecified: Secondary | ICD-10-CM | POA: Diagnosis not present

## 2022-04-30 DIAGNOSIS — N179 Acute kidney failure, unspecified: Secondary | ICD-10-CM | POA: Diagnosis not present

## 2022-04-30 DIAGNOSIS — R7401 Elevation of levels of liver transaminase levels: Secondary | ICD-10-CM

## 2022-04-30 DIAGNOSIS — K859 Acute pancreatitis without necrosis or infection, unspecified: Secondary | ICD-10-CM

## 2022-04-30 DIAGNOSIS — N1831 Chronic kidney disease, stage 3a: Secondary | ICD-10-CM | POA: Diagnosis not present

## 2022-04-30 DIAGNOSIS — D649 Anemia, unspecified: Secondary | ICD-10-CM

## 2022-04-30 LAB — COMPREHENSIVE METABOLIC PANEL
ALT: 148 U/L — ABNORMAL HIGH (ref 0–44)
AST: 169 U/L — ABNORMAL HIGH (ref 15–41)
Albumin: 2.4 g/dL — ABNORMAL LOW (ref 3.5–5.0)
Alkaline Phosphatase: 196 U/L — ABNORMAL HIGH (ref 38–126)
Anion gap: 7 (ref 5–15)
BUN: 16 mg/dL (ref 6–20)
CO2: 22 mmol/L (ref 22–32)
Calcium: 8.7 mg/dL — ABNORMAL LOW (ref 8.9–10.3)
Chloride: 109 mmol/L (ref 98–111)
Creatinine, Ser: 1.61 mg/dL — ABNORMAL HIGH (ref 0.61–1.24)
GFR, Estimated: 51 mL/min — ABNORMAL LOW (ref >60)
Glucose, Bld: 123 mg/dL — ABNORMAL HIGH (ref 70–99)
Potassium: 4 mmol/L (ref 3.5–5.1)
Sodium: 138 mmol/L (ref 135–145)
Total Bilirubin: 1.1 mg/dL (ref 0.3–1.2)
Total Protein: 6.1 g/dL — ABNORMAL LOW (ref 6.5–8.1)

## 2022-04-30 LAB — CBC
HCT: 30.8 % — ABNORMAL LOW (ref 39.0–52.0)
Hemoglobin: 10.4 g/dL — ABNORMAL LOW (ref 13.0–17.0)
MCH: 32.3 pg (ref 26.0–34.0)
MCHC: 33.8 g/dL (ref 30.0–36.0)
MCV: 95.7 fL (ref 80.0–100.0)
Platelets: 372 10*3/uL (ref 150–400)
RBC: 3.22 MIL/uL — ABNORMAL LOW (ref 4.22–5.81)
RDW: 13.4 % (ref 11.5–15.5)
WBC: 9.8 10*3/uL (ref 4.0–10.5)
nRBC: 0 % (ref 0.0–0.2)

## 2022-04-30 LAB — GLUCOSE, CAPILLARY
Glucose-Capillary: 120 mg/dL — ABNORMAL HIGH (ref 70–99)
Glucose-Capillary: 191 mg/dL — ABNORMAL HIGH (ref 70–99)

## 2022-04-30 MED ORDER — HYDRALAZINE HCL 50 MG PO TABS
50.0000 mg | ORAL_TABLET | Freq: Two times a day (BID) | ORAL | Status: DC
Start: 2022-04-30 — End: 2022-06-16

## 2022-04-30 MED ORDER — MELATONIN 3 MG PO TABS
6.0000 mg | ORAL_TABLET | Freq: Once | ORAL | Status: AC
  Administered 2022-04-30: 6 mg via ORAL
  Filled 2022-04-30: qty 2

## 2022-04-30 NOTE — Progress Notes (Signed)
Patient requested [place] papers. This nurse advised patient not to discharge due to scheduled tests, but patient insisted. This nurse removed IV and [name] signed his [place] papers.

## 2022-04-30 NOTE — TOC Progression Note (Signed)
Transition of Care [facility]) - Progression Note    Patient Details  Name: Taylor Obrien MRN: [id number] Date of Birth: Sep 29, 1966  [facility] CM/SW Contact  Shade Flood, LCSW Phone Number: 04/30/2022, 12:32 PM  Clinical Narrative:     TOC following. Met with pt to complete consult for SA treatment resources. Discussed substance use with pt who states that he has quit. He states that he worked it out with God and that he does not need any treatment resources.   Pt states he is being discharged today. He denies any HH or other TOC needs for dc. Of note, Dr. Carles Collet earlier stated that GI is planning to consult and timing for dc would depend on their recommendations.  TOC will be available if any needs arise.  Expected Discharge Plan: Home/Self Care Barriers to Discharge: Continued Medical Work up  Expected Discharge Plan and Services Expected Discharge Plan: Home/Self Care                                               Social Determinants of Health (SDOH) Interventions    Readmission Risk Interventions    04/26/2022   11:38 AM 04/25/2022    9:38 AM  Readmission Risk Prevention Plan  Transportation Screening Complete Complete  Home Care Screening  Complete  Medication Review (RN CM)  Complete  HRI or Home Care Consult Complete   Social Work Consult for [place] Planning/Counseling Complete   Palliative Care Screening Not Applicable   Medication Review Press photographer) Complete

## 2022-04-30 NOTE — Consult Note (Signed)
@LOGO @   Referring Provider: Dr. Carles Collet Primary Care Physician:  [facility] [place] Primary Gastroenterologist:  Dr. Jenetta Downer (previously unassigned)   Date of Admission: 04/26/22 Date of Consultation: 04/30/22  Reason for Consultation:  Transaminitis   HPI:  Taylor Obrien is a 56 y.o. year old male with history of hypertension, hyperlipidemia, diabetes mellitus, depression, GERD, tobacco abuse, alcohol abuse, presenting with upper abdominal pain for at least the past 4 days with nausea/vomiting.   Prior ED evaluation on 04/11/22 reporting left lower quadrant abdominal pain and dysuria.  UA negative for pyuria.  He has follow-up with PCP on started on ciprofloxacin 04/22/2022.   He presented again to the emergency room on 5/17 and was actually admitted and started on treatment for acute pancreatitis.  He left AMA on 5/18.  He returned to emergency room on 5/19 with persistent abdominal pain that worsened again within 2 hours of returning home, radiating to his back.  ED Course:  In the emergency department, he was hemodynamically stable, BP was 163/103, other vital signs were within normal range.  Work-up in the ED showed leukocytosis, thrombocytosis, normocytic anemia, hyponatremia, hyperglycemia, BUN 28/2.06 (creatinine is within recent baseline range).  Lipase 391, previously 522 on 5/17.   Hospital Course:  He was admitted with acute alcoholic pancreatitis thought to be alcohol induced as he admitted to drinking up to 440 ounce beer daily.  Triglycerides were 231.  RUQ ultrasound with gallbladder sludge without choledocholithiasis or cholecystitis, hepatic steatosis.  He has been treated with IV fluids and opioids for pain management, diet has been advanced to soft.   Noted bump in liver enzymes on 5/22 with AST 91, ALT 90, alk phos 164, total bilirubin 1.3.  Hepatitis B surface antigen and hepatitis C antibody nonreactive.  Lipitor and fenofibrate based on hold.  Labs today with AST  169, ALT 148, alk phos 196, total bilirubin 1.1.  GI consulted for further evaluation.  Consult:  Feeling good. Wants to go home. No nausea, vomiting. Abd pain improved. Had some epigastric abdominal pain radiating to his back this morning about 4-5/10 in severity , resolved after pain medication. Tolerating diet well. Had been drinking 2, 40 oz beer daily. History of heavy alcohol use 5 years ago with liquor daily. No Hx of illicit drug use aside from marijuana.    No brbpr or melena. Chronic GERD on Protonix daily outpatient which is well controlled. No dysphagia. Had some constipation this admission. Went without a BM for 4-5 days. Received an enema and had BM yesterday. No BM today. No abdominal distension or LE edema. Denies mental status changes.   Takes 2 oxycodone for chronic upper back pain at home. Prescribed naproxen but hasn't needed in a while. Takes 325 mg aspirin daily. 2 tylenol daily as needed.    No known history of liver disease. No Fhx of liver disease or personal or family hstory of autoimmune conditions.   No new medications aside from antibiotic (cipro) outpatient.   No prior EGD or colonoscopy.   Past Medical History:  Diagnosis Date   Arthritis    Chronic pain    Depression    Diabetes mellitus without complication ([place])    Essential hypertension, benign    GERD (gastroesophageal reflux disease)    Headache    HTN (hypertension) 11/27/2015   Hyperlipidemia    Sleep apnea    cpap    Stroke [facility])    2016    Past Surgical History:  Procedure Laterality  Date   CLOSED REDUCTION MANDIBULAR FRACTURE W/ ARCH BARS     + multiple extractions   Condyloma resection     MULTIPLE EXTRACTIONS WITH ALVEOLOPLASTY Bilateral 01/23/2018   Procedure: DENTAL EXTRACTION OF TEETH NUMBER ONE, TWO, THREE, FOUR, FIVE, SIX, SEVEN, EIGHT, NINE, TEN, ELEVEN, TWELVE, THIRTEEN, FOURTEEN, FIFTEEN, SIXTEEN, SEVENTEEN, TWENTY, TWENTY-ONE, TWENTY-TWO, TWENTY-THREE, TWENTY-FOUR, TWENTY-FIVE,  TWENTY-SIX, TWENTY-SEVEN, TWENTY-EIGHT, TWENTY-NINE, THIRTY-TWO WITH ALVEOLOPLASTY;  Surgeon: Diona Browner, DDS;  Location: [place];  Service: Oral Surgery;  Lat   RADIOLOGY WITH ANESTHESIA N/A 11/18/2015   Procedure: RADIOLOGY WITH ANESTHESIA;  Surgeon: Luanne Bras, MD;  Location: [place];  Service: Radiology;  Laterality: N/A;   Removal foreign body right shoulder     Right rotator cuff repair     TEE WITHOUT CARDIOVERSION N/A 11/21/2015   Procedure: TRANSESOPHAGEAL ECHOCARDIOGRAM (TEE);  Surgeon: Larey Dresser, MD;  Location: [place];  Service: Cardiovascular;  Laterality: N/A;   TOOTH EXTRACTION N/A 01/24/2018   Procedure: SUTURE ORAL WOUND;  Surgeon: Diona Browner, DDS;  Location: [place];  Service: Oral Surgery;  Laterality: N/A;    Prior to Admission medications   Medication Sig Start Date End Date Taking? Authorizing Provider  amLODipine (NORVASC) 10 MG tablet Take 1 tablet (10 mg total) by mouth daily. 01/19/16  Yes Kirsteins, [name], MD  aspirin 325 MG tablet Take 1 tablet (325 mg total) by mouth daily. 12/20/15  Yes Love, Ivan Anchors, PA-C  atorvastatin (LIPITOR) 80 MG tablet Take 80 mg by mouth daily. 03/08/22  Yes [provider]  baclofen (LIORESAL) 10 MG tablet Take 10 mg by mouth daily. 04/17/22  Yes [provider]  ciprofloxacin (CIPRO) 500 MG tablet Take 500 mg by mouth 2 (two) times daily. 04/22/22  Yes [provider]  cloNIDine (CATAPRES - DOSED IN MG/24 HR) 0.3 mg/24hr patch Place 1 patch (0.3 mg total) onto the skin once a week. Next change on monday Patient taking differently: Place 0.3 mg onto the skin every Wednesday. Next change on monday 12/20/15  Yes Love, Ivan Anchors, PA-C  diphenhydramine-acetaminophen (TYLENOL PM) 25-500 MG TABS tablet Take 1-2 tablets by mouth at bedtime as needed (for sleep). Depends on insomnia if takes 1-2 tablets   Yes [provider]  empagliflozin (JARDIANCE) 25 MG TABS tablet Take 25 mg by mouth daily.     Yes [provider]  escitalopram (LEXAPRO) 10 MG tablet Take 10 mg by mouth daily.   Yes [provider]  fenofibrate (TRICOR) 145 MG tablet Take 145 mg by mouth daily. 04/10/22  Yes [provider]  hydrochlorothiazide (HYDRODIURIL) 25 MG tablet Take 25 mg by mouth daily. 04/17/22  Yes [provider]  hydrOXYzine (ATARAX) 25 MG tablet Take 25 mg by mouth every 8 (eight) hours as needed for anxiety. 04/17/22  Yes [provider]  labetalol (NORMODYNE) 300 MG tablet Take 300 mg by mouth 2 (two) times daily. 03/08/22  Yes [provider]  metFORMIN (GLUCOPHAGE) 500 MG tablet Take 1 tablet (500 mg total) by mouth 2 (two) times daily with a meal. 03/18/17  Yes Forde Dandy, MD  naproxen (NAPROSYN) 500 MG tablet Take 500 mg by mouth 2 (two) times daily. 03/12/22  Yes [provider]  oxyCODONE (OXY IR/ROXICODONE) 5 MG immediate release tablet Take 5 mg by mouth every 8 (eight) hours. 04/17/22  Yes [provider]  pantoprazole (PROTONIX) 40 MG tablet Take 1 tablet (40 mg total) by mouth at bedtime. 01/19/16  Yes Kirsteins,  [name], MD  traZODone (DESYREL) 100 MG tablet Take 1 tablet (100 mg total) by mouth at bedtime. 01/19/16  Yes Kirsteins, [name], MD  triamcinolone cream (KENALOG) 0.1 % Apply 1 application. topically daily.   Yes [provider]  VASCEPA 1 g capsule Take 2 g by mouth 2 (two) times daily. 03/12/22  Yes [provider]  blood glucose meter kit and supplies KIT Dispense based on patient and insurance preference. Use up to four times daily as directed. (FOR ICD-9 250.00, 250.01). 03/18/17   Forde Dandy, MD  glucose blood test strip Use as instructed 03/18/17   Forde Dandy, MD  metFORMIN (GLUCOPHAGE) 500 MG tablet Take 1 tablet (500 mg total) by mouth 2 (two) times daily with a meal. Patient not taking: Reported on 04/24/2022 03/22/18   Milton Ferguson, MD    Current Facility-Administered Medications   Medication Dose Route Frequency Provider Last Rate Last Admin   amLODipine (NORVASC) tablet 10 mg  10 mg Oral Daily Adefeso, Oladapo, DO   10 mg at 04/30/22 0947   enoxaparin (LOVENOX) injection 40 mg  40 mg Subcutaneous Q24H Adefeso, Oladapo, DO   40 mg at 04/30/22 0946   escitalopram (LEXAPRO) tablet 10 mg  10 mg Oral Daily Adefeso, Oladapo, DO   10 mg at 04/30/22 0941   feeding supplement (GLUCERNA SHAKE) (GLUCERNA SHAKE) liquid 237 mL  237 mL Oral TID BM Adefeso, Oladapo, DO   237 mL at 54/56/25 6389   folic acid (FOLVITE) tablet 1 mg  1 mg Oral Daily Tat, David, MD   1 mg at 04/30/22 0940   hydrALAZINE (APRESOLINE) tablet 50 mg  50 mg Oral BID Tat, Shanon Brow, MD   50 mg at 04/30/22 0941   HYDROmorphone (DILAUDID) injection 0.5 mg  0.5 mg Intravenous Q3H PRN Tat, Shanon Brow, MD   0.5 mg at 04/30/22 0940   insulin aspart (novoLOG) injection 0-5 Units  0-5 Units Subcutaneous QHS Adefeso, Oladapo, DO       insulin aspart (novoLOG) injection 0-9 Units  0-9 Units Subcutaneous TID WC Adefeso, Oladapo, DO   1 Units at 04/29/22 1710   labetalol (NORMODYNE) tablet 300 mg  300 mg Oral BID Adefeso, Oladapo, DO   300 mg at 04/30/22 0941   lactated ringers infusion   Intravenous Continuous Tat, Shanon Brow, MD 125 mL/hr at 04/30/22 0552 New Bag at 04/30/22 3734   multivitamin with minerals tablet 1 tablet  1 tablet Oral Daily Tat, David, MD   1 tablet at 04/30/22 0941   nicotine (NICODERM CQ - dosed in mg/24 hours) patch 21 mg  21 mg Transdermal Daily Tat, David, MD   21 mg at 04/30/22 0945   ondansetron (ZOFRAN) injection 4 mg  4 mg Intravenous Q6H PRN Adefeso, Oladapo, DO   4 mg at 04/28/22 1037   pantoprazole (PROTONIX) EC tablet 40 mg  40 mg Oral BID Tat, Shanon Brow, MD   40 mg at 04/30/22 0941   thiamine tablet 100 mg  100 mg Oral Daily Tat, David, MD   100 mg at 04/30/22 0940   Or   thiamine (B-1) injection 100 mg  100 mg Intravenous Daily Tat, David, MD   100 mg at 04/26/22 1812   traZODone (DESYREL) tablet 100 mg   100 mg Oral QHS Adefeso, Oladapo, DO   100 mg at 04/29/22 2142    Allergies as of 04/26/2022 - Review Complete 04/26/2022  Allergen Reaction Noted   Oxcarbazepine Other (See Comments) 08/02/2011  Family History  Problem Relation Age of Onset   Diabetes Mother    Hypertension Mother    Drug abuse Mother    Hyperlipidemia Mother    Diabetes Father    Hypertension Father    Hyperlipidemia Father    Diabetes Brother    Hypertension Brother     Social History   Socioeconomic History   Marital status: Married    Spouse name: Not on file   Number of children: Not on file   Years of education: Not on file   Highest education level: Not on file  Occupational History   Not on file  Tobacco Use   Smoking status: Every Day    Packs/day: 0.50    Years: 30.00    Pack years: 15.00    Types: Cigarettes    Last attempt to quit: 11/17/2015    Years since quitting: 6.4   Smokeless tobacco: Never  Vaping Use   Vaping Use: Never used  Substance and Sexual Activity   Alcohol use: No   Drug use: Yes    Types: Marijuana   Sexual activity: Yes    Birth control/protection: None  Other Topics Concern   Not on file  Social History Narrative   Not on file   Social Determinants of Health   Financial Resource Strain: Not on file  Food Insecurity: Not on file  Transportation Needs: Not on file  Physical Activity: Not on file  Stress: Not on file  Social Connections: Not on file  Intimate Partner Violence: Not on file    Review of Systems: Gen: Denies fever, chills, cold or flulike symptoms, presyncope, syncope. CV: Denies chest pain, heart palpitations. Resp: Denies shortness of breath or cough. GI: See HPI GU : Denies urinary burning, urinary frequency, urinary incontinence.  MS: Admits to chronic upper back pain. Derm: Denies rash. Psych: Denies depression, anxiety. Heme: See HPI.  Physical Exam: Vital signs in last 24 hours: Temp:  [91 F (32.8 C)-98.8 F (37.1 C)]  98.8 F (37.1 C) (05/23 0506) Pulse Rate:  [80-117] 86 (05/23 0506) Resp:  [16-18] 18 (05/23 0506) BP: (122-161)/(87-98) 159/90 (05/23 0506) SpO2:  [96 %-98 %] 96 % (05/23 0506) Last BM Date : 04/29/22 General:   Alert,  Well-developed, well-nourished, pleasant and cooperative in NAD Head:  Normocephalic and atraumatic. Eyes:  Sclera clear, no icterus.   Conjunctiva pink. Ears:  Normal auditory acuity. Nose:  No deformity, discharge,  or lesions. Lungs:  Clear throughout to auscultation.   No wheezes, crackles, or rhonchi. No acute distress. Heart:  Regular rate and rhythm; no murmurs, clicks, rubs,  or gallops. Abdomen:  Full, soft, mild TTP in RUQ and RLQ though patient denies pain in these locations without palpation. No masses, hepatosplenomegaly or hernias noted. Normal bowel sounds, without guarding, and without rebound.   Rectal:  Deferred Msk:  Symmetrical without gross deformities. Normal posture. Extremities:  Without edema. Neurologic:  Alert and  oriented x4;  grossly normal neurologically. Skin:  Intact without significant lesions or rashes. Psych:  Normal mood and affect.  Intake/Output from previous day: 05/22 0701 - 05/23 0700 In: 6741.7 [P.O.:600; I.V.:6141.7] Out: 1900 [Urine:1900] Intake/Output this shift: No intake/output data recorded.  Lab Results: Recent Labs    04/28/22 0540 04/29/22 0235 04/30/22 0546  WBC 9.8 12.6* 9.8  HGB 10.8* 10.8* 10.4*  HCT 31.9* 33.2* 30.8*  PLT 361 377 372   BMET Recent Labs    04/28/22 0540 04/29/22 0235 04/30/22 0546  NA  139 138 138  K 3.8 4.0 4.0  CL 108 106 109  CO2 24 24 22   GLUCOSE 126* 126* 123*  BUN 19 18 16   CREATININE 1.58* 1.59* 1.61*  CALCIUM 8.7* 8.6* 8.7*   LFT Recent Labs    04/29/22 0235 04/30/22 0546  PROT 6.4* 6.1*  ALBUMIN 2.5* 2.4*  AST 91* 169*  ALT 90* 148*  ALKPHOS 164* 196*  BILITOT 1.3* 1.1   Hepatitis Panel Recent Labs    04/29/22 1535  HEPBSAG NON REACTIVE  HCVAB NON  REACTIVE     Impression: 56 y.o. year old male with history of hypertension, hyperlipidemia, diabetes mellitus, depression, GERD, tobacco abuse, alcohol abuse, admitted on 5/19 with acute pancreatitis suspected secondary to EtOH.  He has been treated supportively with IV fluids and opioid pain management and has had good clinical improvement, now tolerating soft diet.  GI now consulted due to transaminitis.  Transaminitis: Slight elevation of AST at 42 on 5/4, normalized on 5/17.  On 5/20, AST again 42.  Yesterday, liver enzymes bumped with AST 91, ALT 90, alk phos 164, T. bili 1.3.  Today, AST 169, ALT 148, alk phos 96, T. bili within normal limits.  RUQ ultrasound on 5/19 with moderate gallbladder sludge without acute cholecystitis, hepatic steatosis without focal lesion.  CBD within normal limits.  Yesterday, hepatitis B surface antigen and hepatitis C antibody nonreactive.  Ferritin elevated this admission, likely reactive as iron and saturation are low. Chronically drinking 2, 40 ounce beer daily.  Previously with very heavy alcohol use 5 years ago. No illicit drug use aside from marijuana. No new medications recently aside from ciprofloxacin outpatient, but hasn't had this admission. Lipitor and fenofibrate stopped yesterday though these were not new medications. Denies any significant abdominal pain though on exam, he has mild TTP in RUQ and RLQ. Denies postprandial pain. No symptoms of decompensated liver disease.   Etiology unclear. Unable to exclude biliary etiology with mild RUQ TTP on exam and known biliary sludge. May also be reactive to nearby pancreatic inflammation. He was noted to have fluid collection in the head of the pancreas, body of the pancreas, and ill-defined fluid collection surrounding the pancreatic head extending inferiorly along the right pararenal fascia. Will update RUQ [place] today and consider additional serologic workup pending findings.    Acute pancreatitis: Likely  secondary to EtOH. Clinically improved with supportive measures. Tolerating soft diet well.  Still receiving Dilaudid once or twice daily though he is also on chronic oxycodone twice daily outpatient.  CT did show low-density fluid collections measuring up to 4.5 x 3.1 cm in the head of the pancreas, smaller fluid collection in the body measuring 1.3 cm, ill-defined fluid collections surrounding the pancreas head and extending inferiorly along right pararenal fascia. He will need repeat CT with pancreatic protocol in 4-6 weeks once inflammation has improved.   Anemia: Hemoglobin previously within normal limits, most recently 15.1 on 5/17.  Down to 12.7 on 5/19, 10.8 on 5/21, 10.4 today.  No overt GI bleeding. On 325 mg aspirin daily.  Prescribed naproxen, but states he has not taken this in quite some time.  Iron and iron saturation low at 32 and 15 respectively, ferritin elevated at 557, likely reactive in the setting of acute pancreatitis. Unable to rule out occult blood loss, but suspect primary etiology of declining hemoglobin is secondary to hemodilution. He would benefit from EGD and colonoscopy outpatient. Continue to monitor for now.   Plan: RUQ [place] Continue PPI  BID Continue supportive measures. Would benefit from EGD and colonoscopy outpatient.  CT pancreatic protocol in 4-6 weeks.    LOS: 4 days    04/30/2022, 11:02 AM   Aliene Altes, [facility]

## 2022-05-03 NOTE — Discharge Summary (Signed)
Physician Discharge Summary  Taylor Obrien MWN:[id number] DOB: 1966/05/20 DOA: 04/24/2022  PCP: [facility]  Admit date: 04/24/2022 Discharge date: 5/18 /2023    Recommendations for Outpatient Follow-up:  LEFT AMA   Discharge Diagnoses:  Acute pancreatitis   Mixed hyperlipidemia   Acute renal failure superimposed on stage 3a chronic kidney disease ([place])   Uncontrolled type 2 diabetes mellitus with hyperglycemia, without long-term current use of insulin (HCC)   Tobacco abuse   Essential hypertension   Depression    Filed Weights   04/24/22 0659  Weight: 95.3 kg    History of present illness:   Taylor Obrien is a 56 year old male with a history of hypertension, hyperlipidemia, diabetes mellitus, depression, GERD, tobacco abuse presenting with upper abdominal pain for at least the past 4 days.  He has a difficult historian at best.  He states that he has had this abdominal pain since he last visited the ED on 04/11/2022.  However review of the record shows that he had left lower quadrant abdominal pain at that time with some dysuria.  His urinalysis was negative for pyuria.  He was not started on any antibiotics, but subsequently he saw his PCP and was started on ciprofloxacin on 04/22/2022.  He has not had any fevers, chills, chest pain, shortness breath, headache, neck pain.  He has complained of nausea and vomiting for the past 3 days.  There is no hematemesis, diarrhea, hematochezia, melena.  He does drink alcohol rarely.  He last drank alcohol about 1 month prior to this admission.  He denies any coughing, hemoptysis, hematochezia, melena. In the emergency department, the patient was afebrile hemodynamically stable with oxygen saturation 95% room air.  WBC 15.5, hemoglobin 15.1, platelets 474,000.  Sodium 133, potassium 4.7, bicarbonate 25, serum creatinine 2.26.  The patient was started on IV fluids.  UA was negative for pyuria.  AST 31, ALT 36, alk phosphatase 41, total  bilirubin 1.5.  CT of the abdomen and pelvis showed heterogenous enhancement of the pancreatic head with an ill-defined fluid collection consistent with acute pancreatitis with peripancreatic fluid collections.  There is no bowel obstruction.  Hospital Course:   Acute pancreatitis -Etiology not clear on admission, treated with bowel rest, IV fluids, supportive care -Left AMA around 10:50 AM on 5/18   Controlled type 2 diabetes mellitus without complication, without long-term current use of insulin (HCC) Holding Jardiance   AKI (acute kidney injury) ([place]) Serum creatinine 2.26 at the time of admission -Hydrated with IV fluids, lisinopril and HCTZ held  Depression Continue Lexapro   Essential hypertension Continue labetalol, clonidine patch, amlodipine   Hyperlipidemia Continue statin   Tobacco abuse Tobacco cessation discussed   Discharge Exam: Vitals:   04/25/22 0011 04/25/22 0438  BP: 121/81 (!) 129/91  Pulse: 77 88  Resp: 19 19  Temp: 98.3 F (36.8 C) 98.4 F (36.9 C)  SpO2: 97% 96%    Discharge Instructions    Allergies as of 04/25/2022       Reactions   Oxcarbazepine Other (See Comments)   Patient goes out of right state of mind.         Medication List     ASK your doctor about these medications    amLODipine 10 MG tablet Commonly known as: NORVASC Take 1 tablet (10 mg total) by mouth daily.   aspirin 325 MG tablet Take 1 tablet (325 mg total) by mouth daily.   blood glucose meter kit and supplies Kit Dispense  based on patient and insurance preference. Use up to four times daily as directed. (FOR ICD-9 250.00, 250.01).   cloNIDine 0.3 mg/24hr patch Commonly known as: CATAPRES - Dosed in mg/24 hr Place 1 patch (0.3 mg total) onto the skin once a week. Next change on monday   diphenhydramine-acetaminophen 25-500 MG Tabs tablet Commonly known as: TYLENOL PM Take 1-2 tablets by mouth at bedtime as needed (for sleep). Depends on insomnia if  takes 1-2 tablets   empagliflozin 25 MG Tabs tablet Commonly known as: JARDIANCE Take 25 mg by mouth daily.   escitalopram 10 MG tablet Commonly known as: LEXAPRO Take 10 mg by mouth daily.   fenofibrate 145 MG tablet Commonly known as: TRICOR Take 145 mg by mouth daily.   glucose blood test strip Use as instructed   hydrOXYzine 25 MG tablet Commonly known as: ATARAX Take 25 mg by mouth every 8 (eight) hours as needed for anxiety.   labetalol 300 MG tablet Commonly known as: NORMODYNE Take 300 mg by mouth 2 (two) times daily. Ask about: Which instructions should I use?   metFORMIN 500 MG tablet Commonly known as: GLUCOPHAGE Take 1 tablet (500 mg total) by mouth 2 (two) times daily with a meal.   pantoprazole 40 MG tablet Commonly known as: PROTONIX Take 1 tablet (40 mg total) by mouth at bedtime.   traZODone 100 MG tablet Commonly known as: DESYREL Take 1 tablet (100 mg total) by mouth at bedtime.   triamcinolone cream 0.1 % Commonly known as: KENALOG Apply 1 application. topically daily.   Vascepa 1 g capsule Generic drug: icosapent Ethyl Take 2 g by mouth 2 (two) times daily.       Allergies  Allergen Reactions   Oxcarbazepine Other (See Comments)    Patient goes out of right state of mind.       The results of significant diagnostics from this hospitalization (including imaging, microbiology, ancillary and laboratory) are listed below for reference.    Significant Diagnostic Studies: CT HEAD WO CONTRAST (5MM)  Result Date: 04/26/2022 CLINICAL DATA:  Fall, head trauma EXAM: CT HEAD WITHOUT CONTRAST TECHNIQUE: Contiguous axial images were obtained from the base of the skull through the vertex without intravenous contrast. RADIATION DOSE REDUCTION: This exam was performed according to the departmental dose-optimization program which includes automated exposure control, adjustment of the mA and/or kV according to patient size and/or use of iterative  reconstruction technique. COMPARISON:  CT head 03/22/2018 FINDINGS: Brain: No acute intracranial hemorrhage, mass effect, or herniation. No extra-axial fluid collections. No evidence of acute territorial infarct. No hydrocephalus. 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 Possible old lacunar infarct in the right thalamus. Patchy hypodensities throughout the periventricular and subcortical white matter, likely secondary to chronic microvascular ischemic changes. Mild cortical volume loss. Vascular: Calcified plaques in the carotid siphons. Skull: Normal. Negative for fracture or focal lesion. Sinuses/Orbits: No acute finding. Other: None. IMPRESSION: Multiple chronic findings as described with no acute intracranial process identified. Electronically Signed   By: Ofilia Neas M.D.   On: 04/26/2022 12:01   CT ABDOMEN PELVIS W CONTRAST  Result Date: 04/24/2022 CLINICAL DATA:  Epigastric/right upper quadrant tenderness with nausea and vomiting. No bowel movement for 3 days. EXAM: CT ABDOMEN AND PELVIS WITH CONTRAST TECHNIQUE: Multidetector CT imaging of the abdomen and pelvis was performed using the standard protocol following bolus administration of intravenous contrast. RADIATION DOSE REDUCTION: This exam was performed according  to the departmental dose-optimization program which includes automated exposure control, adjustment of the mA and/or kV according to patient size and/or use of iterative reconstruction technique. CONTRAST:  4m OMNIPAQUE IOHEXOL 300 MG/ML  SOLN COMPARISON:  None Available. FINDINGS: Lower chest: Clear lung bases. No significant pleural or pericardial effusion. Atherosclerosis of the aorta and coronary arteries. Small hiatal hernia. Hepatobiliary: The liver is normal in density without suspicious focal abnormality. No evidence of gallstones, gallbladder wall thickening or biliary dilatation. Pancreas:  The pancreatic head is enlarged with heterogeneous low-density fluid collections measuring up to 4.5 x 3.1 cm on image 34/2. There is a smaller fluid collection within the pancreatic body measuring 1.3 cm on image 25/2. There are inflammatory changes and ill-defined fluid collections surrounding the pancreatic head and extending inferiorly along the right pararenal fascia. No evidence of pancreatic hemorrhage or necrosis. No pancreatic ductal dilatation. Spleen: Normal in size without focal abnormality. Adrenals/Urinary Tract: Both adrenal glands appear normal. Tiny nonobstructing calculus in the lower pole of the right kidney. No evidence of ureteral calculus or hydronephrosis. A well-circumscribed 1.4 cm lesion in the anterior suprahilar lip of the right kidney measures higher than water density (36 HU) and is indeterminate.The kidneys otherwise appear unremarkable. The urinary bladder is mildly distended without focal abnormality. Stomach/Bowel: No enteric contrast administered. The stomach appears unremarkable for its degree of distension. No evidence of bowel wall thickening, distention or surrounding inflammatory change. The appendix appears normal. Mildly prominent stool throughout the colon. Vascular/Lymphatic: There are no enlarged abdominal or pelvic lymph nodes. Diffuse aortic and branch vessel atherosclerosis without aneurysm. The portal, superior mesenteric and splenic veins are patent. Reproductive: The prostate gland and seminal vesicles appear normal. Other: As above, peripancreatic inflammatory changes with inferior extension along the right anterior pararenal fascia and base of the mesentery. No free air or ascites. Musculoskeletal: No acute or significant osseous findings. Mild lumbar spine facet arthropathy. IMPRESSION: 1. Heterogeneous enlargement of the pancreatic head with ill-defined fluid collections and surrounding inflammatory changes consistent with acute pancreatitis and acute  peripancreatic fluid collections in this patient with an elevated serum lipase level. No evidence of pancreatic necrosis or hemorrhage. 2. No evidence of bowel obstruction or focal bowel lesion. 3. Indeterminate right renal lesion, statistically a mildly complex cyst which measures higher than water density. Consider nonemergent renal ultrasound for further characterization. Alternatively, if follow up abdominal CT is planned to address the patient's pancreatitis, inclusion of pre contrast images through the kidneys could be performed to better characterize this lesion. 4. Coronary and Aortic Atherosclerosis (ICD10-I70.0). Electronically Signed   By: WRichardean SaleM.D.   On: 04/24/2022 11:34   UK[place]Abdomen Limited  Result Date: 04/26/2022 CLINICAL DATA:  Pancreatitis. EXAM: ULTRASOUND ABDOMEN LIMITED RIGHT UPPER QUADRANT COMPARISON:  CT 04/24/2022 FINDINGS: Gallbladder: Moderate gallbladder sludge. No gallstones or wall thickening. Negative sonographic Murphy sign. No adjacent free fluid. Common bile duct: Diameter: 5.0 mm. Liver: Diffuse increased parenchymal echogenicity compatible with steatosis. No focal liver mass. Portal vein is patent on color Doppler imaging with normal direction of blood flow towards the liver. Other: No free fluid. IMPRESSION: 1. Moderate gallbladder sludge without additional sonographic evidence to suggest acute cholecystitis. 2.  Hepatic steatosis without focal mass. Electronically Signed   By: DMarin OlpM.D.   On: 04/26/2022 08:57    Microbiology: Recent Results (from the past 240 hour(s))  Culture, blood (Routine X 2) w Reflex to ID Panel     Status: None   Collection Time: 04/24/22  12:51 PM   Specimen: BLOOD LEFT HAND  Result Value Ref Range Status   Specimen Description BLOOD LEFT HAND  Final   Special Requests   Final    BOTTLES DRAWN AEROBIC AND ANAEROBIC Blood Culture results may not be optimal due to an inadequate volume of blood received in culture bottles    Culture   Final    NO GROWTH 5 DAYS Performed at [facility], [address], [place], [place] [address]    Report Status 04/29/2022 FINAL  Final  Culture, blood (Routine X 2) w Reflex to ID Panel     Status: None   Collection Time: 04/24/22 12:51 PM   Specimen: BLOOD RIGHT HAND  Result Value Ref Range Status   Specimen Description BLOOD RIGHT HAND  Final   Special Requests   Final    BOTTLES DRAWN AEROBIC AND ANAEROBIC Blood Culture results may not be optimal due to an inadequate volume of blood received in culture bottles   Culture   Final    NO GROWTH 5 DAYS Performed at [facility], [address], [place], [place] [address]    Report Status 04/29/2022 FINAL  Final     Labs: Basic Metabolic Panel: Recent Labs  Lab 04/27/22 0512 04/28/22 0540 04/29/22 0235 04/30/22 0546  NA 137 139 138 138  K 4.0 3.8 4.0 4.0  CL 105 108 106 109  CO2 23 24 24 22   GLUCOSE 139* 126* 126* 123*  BUN 21* 19 18 16   CREATININE 1.54* 1.58* 1.59* 1.61*  CALCIUM 8.9 8.7* 8.6* 8.7*   Liver Function Tests: Recent Labs  Lab 04/27/22 0512 04/29/22 0235 04/30/22 0546  AST 42* 91* 169*  ALT 42 90* 148*  ALKPHOS 86 164* 196*  BILITOT 1.2 1.3* 1.1  PROT 7.0 6.4* 6.1*  ALBUMIN 2.8* 2.5* 2.4*   No results for input(s): LIPASE, AMYLASE in the last 168 hours. No results for input(s): AMMONIA in the last 168 hours. CBC: Recent Labs  Lab 04/27/22 0512 04/28/22 0540 04/29/22 0235 04/30/22 0546  WBC 12.1* 9.8 12.6* 9.8  HGB 12.0* 10.8* 10.8* 10.4*  HCT 35.5* 31.9* 33.2* 30.8*  MCV 94.4 94.9 95.7 95.7  PLT 412* 361 377 372   Cardiac Enzymes: No results for input(s): CKTOTAL, CKMB, CKMBINDEX, TROPONINI in the last 168 hours. BNP: BNP (last 3 results) No results for input(s): BNP in the last 8760 hours.  ProBNP (last 3 results) No results for input(s): PROBNP in the last 8760 hours.  CBG: Recent Labs  Lab 04/29/22 1126 04/29/22 1630 04/29/22 2135 04/30/22 0741 04/30/22 1110   GLUCAP 118* 143* 106* 120* 191*       Signed:  Domenic Polite MD.  [name] 05/03/2022, 1:54 PM

## 2022-05-30 ENCOUNTER — Encounter: Payer: Self-pay | Admitting: Internal Medicine

## 2022-06-04 ENCOUNTER — Observation Stay (HOSPITAL_COMMUNITY)
Admission: EM | Admit: 2022-06-04 | Discharge: 2022-06-05 | Discharge status: Against Medical Advice | Payer: Medicare Other | Attending: Internal Medicine | Admitting: Internal Medicine

## 2022-06-04 ENCOUNTER — Emergency Department (HOSPITAL_COMMUNITY): Payer: Medicare Other

## 2022-06-04 ENCOUNTER — Encounter (HOSPITAL_COMMUNITY): Payer: Self-pay | Admitting: Emergency Medicine

## 2022-06-04 ENCOUNTER — Other Ambulatory Visit: Payer: Self-pay

## 2022-06-04 DIAGNOSIS — I129 Hypertensive chronic kidney disease with stage 1 through stage 4 chronic kidney disease, or unspecified chronic kidney disease: Secondary | ICD-10-CM | POA: Insufficient documentation

## 2022-06-04 DIAGNOSIS — Z720 Tobacco use: Secondary | ICD-10-CM | POA: Diagnosis present

## 2022-06-04 DIAGNOSIS — Z7982 Long term (current) use of aspirin: Secondary | ICD-10-CM | POA: Insufficient documentation

## 2022-06-04 DIAGNOSIS — F1010 Alcohol abuse, uncomplicated: Secondary | ICD-10-CM | POA: Diagnosis not present

## 2022-06-04 DIAGNOSIS — N179 Acute kidney failure, unspecified: Secondary | ICD-10-CM | POA: Diagnosis not present

## 2022-06-04 DIAGNOSIS — R7401 Elevation of levels of liver transaminase levels: Secondary | ICD-10-CM | POA: Diagnosis not present

## 2022-06-04 DIAGNOSIS — F17210 Nicotine dependence, cigarettes, uncomplicated: Secondary | ICD-10-CM | POA: Insufficient documentation

## 2022-06-04 DIAGNOSIS — Z8673 Personal history of transient ischemic attack (TIA), and cerebral infarction without residual deficits: Secondary | ICD-10-CM | POA: Insufficient documentation

## 2022-06-04 DIAGNOSIS — I6350 Cerebral infarction due to unspecified occlusion or stenosis of unspecified cerebral artery: Secondary | ICD-10-CM

## 2022-06-04 DIAGNOSIS — N1831 Chronic kidney disease, stage 3a: Secondary | ICD-10-CM | POA: Diagnosis present

## 2022-06-04 DIAGNOSIS — Z7984 Long term (current) use of oral hypoglycemic drugs: Secondary | ICD-10-CM | POA: Diagnosis not present

## 2022-06-04 DIAGNOSIS — E119 Type 2 diabetes mellitus without complications: Secondary | ICD-10-CM

## 2022-06-04 DIAGNOSIS — K852 Alcohol induced acute pancreatitis without necrosis or infection: Secondary | ICD-10-CM | POA: Diagnosis not present

## 2022-06-04 DIAGNOSIS — K859 Acute pancreatitis without necrosis or infection, unspecified: Secondary | ICD-10-CM | POA: Diagnosis present

## 2022-06-04 DIAGNOSIS — E1122 Type 2 diabetes mellitus with diabetic chronic kidney disease: Secondary | ICD-10-CM | POA: Diagnosis not present

## 2022-06-04 DIAGNOSIS — I639 Cerebral infarction, unspecified: Secondary | ICD-10-CM | POA: Diagnosis present

## 2022-06-04 DIAGNOSIS — R1011 Right upper quadrant pain: Secondary | ICD-10-CM | POA: Diagnosis present

## 2022-06-04 DIAGNOSIS — Z79899 Other long term (current) drug therapy: Secondary | ICD-10-CM | POA: Insufficient documentation

## 2022-06-04 DIAGNOSIS — I10 Essential (primary) hypertension: Secondary | ICD-10-CM | POA: Diagnosis present

## 2022-06-04 LAB — URINALYSIS, ROUTINE W REFLEX MICROSCOPIC
Bacteria, UA: NONE SEEN
Bilirubin Urine: NEGATIVE
Glucose, UA: >=500 mg/dL — AB
Ketones, ur: NEGATIVE mg/dL
Leukocytes,Ua: NEGATIVE
Nitrite: NEGATIVE
Protein, ur: 100 mg/dL — AB
Specific Gravity, Urine: 1.022 (ref 1.005–1.030)
pH: 6 (ref 5.0–8.0)

## 2022-06-04 LAB — COMPREHENSIVE METABOLIC PANEL
ALT: 68 U/L — ABNORMAL HIGH (ref 0–44)
AST: 48 U/L — ABNORMAL HIGH (ref 15–41)
Albumin: 3.6 g/dL (ref 3.5–5.0)
Alkaline Phosphatase: 51 U/L (ref 38–126)
Anion gap: 8 (ref 5–15)
BUN: 14 mg/dL (ref 6–20)
CO2: 27 mmol/L (ref 22–32)
Calcium: 8.8 mg/dL — ABNORMAL LOW (ref 8.9–10.3)
Chloride: 106 mmol/L (ref 98–111)
Creatinine, Ser: 1.86 mg/dL — ABNORMAL HIGH (ref 0.61–1.24)
GFR, Estimated: 43 mL/min — ABNORMAL LOW (ref >60)
Glucose, Bld: 109 mg/dL — ABNORMAL HIGH (ref 70–99)
Potassium: 4.6 mmol/L (ref 3.5–5.1)
Sodium: 141 mmol/L (ref 135–145)
Total Bilirubin: 0.8 mg/dL (ref 0.3–1.2)
Total Protein: 7 g/dL (ref 6.5–8.1)

## 2022-06-04 LAB — GLUCOSE, CAPILLARY: Glucose-Capillary: 117 mg/dL — ABNORMAL HIGH (ref 70–99)

## 2022-06-04 LAB — CBC
HCT: 39.2 % (ref 39.0–52.0)
Hemoglobin: 12.4 g/dL — ABNORMAL LOW (ref 13.0–17.0)
MCH: 31.2 pg (ref 26.0–34.0)
MCHC: 31.6 g/dL (ref 30.0–36.0)
MCV: 98.5 fL (ref 80.0–100.0)
Platelets: 277 10*3/uL (ref 150–400)
RBC: 3.98 MIL/uL — ABNORMAL LOW (ref 4.22–5.81)
RDW: 15.7 % — ABNORMAL HIGH (ref 11.5–15.5)
WBC: 8.1 10*3/uL (ref 4.0–10.5)
nRBC: 0 % (ref 0.0–0.2)

## 2022-06-04 LAB — LIPASE, BLOOD: Lipase: 1465 U/L — ABNORMAL HIGH (ref 11–51)

## 2022-06-04 MED ORDER — ACETAMINOPHEN 325 MG PO TABS: 650.0000 mg | ORAL_TABLET | Freq: Four times a day (QID) | ORAL | Status: DC | PRN

## 2022-06-04 MED ORDER — PANTOPRAZOLE SODIUM 40 MG IV SOLR
40.0000 mg | Freq: Every day | INTRAVENOUS | Status: DC
  Administered 2022-06-04: 40 mg via INTRAVENOUS
  Filled 2022-06-04: qty 10

## 2022-06-04 MED ORDER — HYDROMORPHONE HCL 1 MG/ML IJ SOLN
1.0000 mg | Freq: Once | INTRAMUSCULAR | Status: AC
  Administered 2022-06-04: 1 mg via INTRAVENOUS
  Filled 2022-06-04: qty 1

## 2022-06-04 MED ORDER — ENOXAPARIN SODIUM 40 MG/0.4ML IJ SOSY
40.0000 mg | PREFILLED_SYRINGE | Freq: Every day | INTRAMUSCULAR | Status: DC
  Administered 2022-06-04: 40 mg via SUBCUTANEOUS
  Filled 2022-06-04: qty 0.4

## 2022-06-04 MED ORDER — HYDROMORPHONE HCL 1 MG/ML IJ SOLN
1.0000 mg | INTRAMUSCULAR | Status: DC | PRN
  Administered 2022-06-04 – 2022-06-05 (×3): 1 mg via INTRAVENOUS
  Filled 2022-06-04 (×3): qty 1

## 2022-06-04 MED ORDER — CLONIDINE HCL 0.1 MG/24HR TD PTWK
0.3000 mg | MEDICATED_PATCH | TRANSDERMAL | Status: DC
  Administered 2022-06-05: 0.3 mg via TRANSDERMAL
  Filled 2022-06-04: qty 1

## 2022-06-04 MED ORDER — LACTATED RINGERS IV SOLN: INTRAVENOUS | Status: DC

## 2022-06-04 MED ORDER — SODIUM CHLORIDE 0.9 % IV BOLUS
1000.0000 mL | Freq: Once | INTRAVENOUS | Status: AC
  Administered 2022-06-04: 1000 mL via INTRAVENOUS

## 2022-06-04 MED ORDER — ONDANSETRON HCL 4 MG/2ML IJ SOLN: 4.0000 mg | Freq: Three times a day (TID) | INTRAMUSCULAR | Status: DC | PRN

## 2022-06-04 MED ORDER — POLYETHYLENE GLYCOL 3350 17 G PO PACK: 17.0000 g | PACK | Freq: Every day | ORAL | Status: DC | PRN

## 2022-06-04 MED ORDER — LABETALOL HCL 200 MG PO TABS
300.0000 mg | ORAL_TABLET | Freq: Two times a day (BID) | ORAL | Status: DC
  Administered 2022-06-05: 300 mg via ORAL
  Filled 2022-06-04: qty 2

## 2022-06-04 MED ORDER — ACETAMINOPHEN 650 MG RE SUPP: 650.0000 mg | Freq: Four times a day (QID) | RECTAL | Status: DC | PRN

## 2022-06-04 MED ORDER — INSULIN ASPART 100 UNIT/ML IJ SOLN: 0.0000 [IU] | INTRAMUSCULAR | Status: DC

## 2022-06-04 MED ORDER — ONDANSETRON HCL 4 MG PO TABS: 4.0000 mg | ORAL_TABLET | Freq: Three times a day (TID) | ORAL | Status: DC | PRN

## 2022-06-04 NOTE — Assessment & Plan Note (Addendum)
CKD 3A - B.  Creatinine today 1.8, baseline 1.5-1.6.

## 2022-06-04 NOTE — Assessment & Plan Note (Signed)
Liver enzymes improved compared to recent hospitalization, AST 48, ALT 68.  PET/CT today, borderline subjective hepatic steatosis.  No focal liver lesion.  Gallbladder partially distended.,  Possible sludge.  No gallstones. -Recent hepatitis panel 5/22- negative.

## 2022-06-04 NOTE — ED Provider Notes (Signed)
[facility] EMERGENCY DEPARTMENT Provider Note   CSN: [id number] Arrival date & time: 06/04/22  1534     History  Chief Complaint  Patient presents with   Abdominal Pain    Taylor Obrien is a 56 y.o. male.   Abdominal Pain   Patient is a 56 year old male with a history of hypertension on amlodipine and clonidine, he is also a diabetic on Jardiance, he has a history of heavy alcohol use but states he has not had any alcohol in over a month since he was last admitted to the hospital for pancreatitis.  He reports over the last 2 days he has had some progressive increasing pain in the upper abdomen both right upper and epigastrium.  There is no nausea or vomiting, no diarrhea.  The pain seems to be getting worse and is definitely worse when he eats or at nighttime.  Home Medications Prior to Admission medications   Medication Sig Start Date End Date Taking? Authorizing Provider  gabapentin (NEURONTIN) 300 MG capsule 1 capsule 05/09/22  Yes [provider]  amLODipine (NORVASC) 10 MG tablet Take 1 tablet (10 mg total) by mouth daily. 01/19/16   Kirsteins, Victorino Sparrow, MD  aspirin 325 MG tablet Take 1 tablet (325 mg total) by mouth daily. 12/20/15   Love, Evlyn Kanner, PA-C  blood glucose meter kit and supplies KIT Dispense based on patient and insurance preference. Use up to four times daily as directed. (FOR ICD-9 250.00, 250.01). 03/18/17   Lavera Guise, MD  cloNIDine (CATAPRES - DOSED IN MG/24 HR) 0.3 mg/24hr patch Place 1 patch (0.3 mg total) onto the skin once a week. Next change on monday Patient taking differently: Place 0.3 mg onto the skin every Wednesday. Next change on monday 12/20/15   Love, Evlyn Kanner, PA-C  diphenhydramine-acetaminophen (TYLENOL PM) 25-500 MG TABS tablet Take 1-2 tablets by mouth at bedtime as needed (for sleep). Depends on insomnia if takes 1-2 tablets    [provider]  empagliflozin (JARDIANCE) 25 MG TABS tablet Take 25 mg by mouth daily.      [provider]  escitalopram (LEXAPRO) 10 MG tablet Take 10 mg by mouth daily.    [provider]  fenofibrate (TRICOR) 145 MG tablet Take 145 mg by mouth daily. 04/10/22   [provider]  glucose blood test strip Use as instructed 03/18/17   Lavera Guise, MD  hydrALAZINE (APRESOLINE) 50 MG tablet Take 1 tablet (50 mg total) by mouth 2 (two) times daily. 04/30/22   Catarina Hartshorn, MD  hydrOXYzine (ATARAX) 25 MG tablet Take 25 mg by mouth every 8 (eight) hours as needed for anxiety. 04/17/22   [provider]  labetalol (NORMODYNE) 300 MG tablet Take 300 mg by mouth 2 (two) times daily. 03/08/22   [provider]  metFORMIN (GLUCOPHAGE) 500 MG tablet Take 1 tablet (500 mg total) by mouth 2 (two) times daily with a meal. 03/18/17   Lavera Guise, MD  nicotine (NICODERM CQ - DOSED IN MG/24 HOURS) 14 mg/24hr patch 14 mg daily. 05/10/22   [provider]  pantoprazole (PROTONIX) 40 MG tablet Take 1 tablet (40 mg total) by mouth at bedtime. 01/19/16   Kirsteins, Victorino Sparrow, MD  traZODone (DESYREL) 100 MG tablet Take 1 tablet (100 mg total) by mouth at bedtime. 01/19/16   Kirsteins, Victorino Sparrow, MD  triamcinolone cream (KENALOG) 0.1 % Apply 1 application. topically daily.    [provider]  VASCEPA 1 g  capsule Take 2 g by mouth 2 (two) times daily. 03/12/22   [provider]      Allergies    Oxcarbazepine    Review of Systems   Review of Systems  Gastrointestinal:  Positive for abdominal pain.  All other systems reviewed and are negative.   Physical Exam Updated Vital Signs BP (!) 161/101   Pulse 92   Temp 99.4 F (37.4 C) (Oral)   Resp 18   Ht 1.676 m (5\' 6" )   Wt 97.5 kg   SpO2 100%   BMI 34.70 kg/m  Physical Exam Vitals and nursing note reviewed.  Constitutional:      General: He is not in acute distress.    Appearance: He is well-developed.  HENT:     Head: Normocephalic and atraumatic.     Mouth/Throat:     Pharynx: No  oropharyngeal exudate.  Eyes:     General: No scleral icterus.       Right eye: No discharge.        Left eye: No discharge.     Conjunctiva/sclera: Conjunctivae normal.     Pupils: Pupils are equal, round, and reactive to light.  Neck:     Thyroid: No thyromegaly.     Vascular: No JVD.  Cardiovascular:     Rate and Rhythm: Normal rate and regular rhythm.     Heart sounds: Normal heart sounds. No murmur heard.    No friction rub. No gallop.  Pulmonary:     Effort: Pulmonary effort is normal. No respiratory distress.     Breath sounds: Normal breath sounds. No wheezing or rales.  Abdominal:     General: Bowel sounds are normal. There is no distension.     Palpations: Abdomen is soft. There is no mass.     Tenderness: There is abdominal tenderness.     Comments: Rotund obese abdomen with tenderness in the epigastrium and right upper quad  Musculoskeletal:        General: No tenderness. Normal range of motion.     Cervical back: Normal range of motion and neck supple.     Right lower leg: No edema.     Left lower leg: No edema.  Lymphadenopathy:     Cervical: No cervical adenopathy.  Skin:    General: Skin is warm and dry.     Findings: No erythema or rash.  Neurological:     Mental Status: He is alert.     Coordination: Coordination normal.  Psychiatric:        Behavior: Behavior normal.     ED Results / Procedures / Treatments   Labs (all labs ordered are listed, but only abnormal results are displayed) Labs Reviewed  LIPASE, BLOOD - Abnormal; Notable for the following components:      Result Value   Lipase 1,465 (*)    All other components within normal limits  COMPREHENSIVE METABOLIC PANEL - Abnormal; Notable for the following components:   Glucose, Bld 109 (*)    Creatinine, Ser 1.86 (*)    Calcium 8.8 (*)    AST 48 (*)    ALT 68 (*)    GFR, Estimated 43 (*)    All other components within normal limits  CBC - Abnormal; Notable for the following components:    RBC 3.98 (*)    Hemoglobin 12.4 (*)    RDW 15.7 (*)    All other components within normal limits  URINALYSIS, ROUTINE W REFLEX MICROSCOPIC - Abnormal; Notable  for the following components:   Glucose, UA >=500 (*)    Hgb urine dipstick SMALL (*)    Protein, ur 100 (*)    All other components within normal limits    EKG None  Radiology CT ABDOMEN PELVIS WO CONTRAST  Result Date: 06/04/2022 CLINICAL DATA:  Right-sided abdominal pain for 1 week. Patient with history of pancreatitis. EXAM: CT ABDOMEN AND PELVIS WITHOUT CONTRAST TECHNIQUE: Multidetector CT imaging of the abdomen and pelvis was performed following the standard protocol without IV contrast. RADIATION DOSE REDUCTION: This exam was performed according to the departmental dose-optimization program which includes automated exposure control, adjustment of the mA and/or kV according to patient size and/or use of iterative reconstruction technique. COMPARISON:  Most recent CT 04/24/2022, ultrasound 04/26/2022 FINDINGS: Lower chest: Patchy atelectasis in both lung bases. Mild cardiomegaly with coronary artery calcifications. No pleural fluid. Hepatobiliary: Borderline subjective hepatic steatosis. No evidence of focal liver lesion. Gallbladder is partially distended. Layering density likely corresponds to sludge seen on prior ultrasound. No calcified gallstone. Common bile duct is difficult to delineate but appears upper normal measuring 7 mm, series 5, image 46. Pancreas: Increased peripancreatic fat stranding about the head and uncinate process. The previous fluid collections within the pancreatic head and body are less well-defined on this current unenhanced exam, there is vague low-density in these regions. No pancreatic air. No obvious new collection allowing for noncontrast technique. There is no ductal dilatation. Spleen: Normal in size without focal abnormality. Adrenals/Urinary Tract: No adrenal nodule. Punctate nonobstructing stone in the  lower right kidney. Calcifications of both renal hila are felt to be vascular. No hydronephrosis there is faint symmetric perinephric edema. The previously described right renal lesion is not well appreciated on the current noncontrast exam. No ureteral calculus. Partially distended urinary bladder without wall thickening. Stomach/Bowel: Detailed bowel assessment is limited in the absence of enteric contrast. Small hiatal hernia. No abnormal gastric distension. No small bowel obstruction or inflammatory change. High-riding cecum in the right mid abdomen. Air-filled appendix without appendicitis. Multifocal colonic diverticulosis, no diverticulitis or acute colonic inflammation. Moderate volume of colonic stool. Vascular/Lymphatic: Age advanced aorto bi-iliac atherosclerosis. No aortic aneurysm. No portal venous or mesenteric gas. Circumaortic left renal vein. No obvious bulky adenopathy in this unenhanced exam. Reproductive: Prostate is unremarkable. Other: Peripancreatic fat stranding and inflammation. No significant ascites or other free fluid. Trace fat in the inguinal canals. Musculoskeletal: There are no acute or suspicious osseous abnormalities. Minor lumbar spondylosis. IMPRESSION: 1. Increased peripancreatic fat stranding about the head and uncinate process, consistent with acute pancreatitis. The previous fluid collections within the pancreatic head and body on CT last month are less well-defined on this current unenhanced exam, there is vague low-density in these regions. No pancreatic gas or definite new peripancreatic collection. 2. Punctate nonobstructing stone in the lower right kidney. 3. Colonic diverticulosis without diverticulitis. 4. 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 5. The indeterminate right renal lesion on prior CT is not well-defined on this unenhanced exam, please reference recommendations detailed on 04/24/2022 abdominopelvic CT. Aortic Atherosclerosis (ICD10-I70.0).  Electronically Signed   By: Narda Rutherford M.D.   On: 06/04/2022 20:28    Procedures Procedures    Medications Ordered in ED Medications  sodium chloride 0.9 % bolus 1,000 mL (1,000 mLs Intravenous New Bag/Given 06/04/22 2009)  HYDROmorphone (DILAUDID) injection 1 mg (1 mg Intravenous Given 06/04/22 2009)    ED Course/ Medical Decision Making/ A&P  Medical Decision Making Amount and/or Complexity of Data Reviewed Labs: ordered. Radiology: ordered.  Risk Prescription drug management. Decision regarding hospitalization.   This patient presents to the ED for concern of abdominal discomfort, this involves an extensive number of treatment options, and is a complaint that carries with it a high risk of complications and morbidity.  The differential diagnosis includes pancreatitis, cholecystitis, peptic ulcer disease, bowel obstruction.   Co morbidities that complicate the patient evaluation  Obesity, heavy alcohol use in the past, known history of pancreatitis   Additional history obtained:  Additional history obtained from patient has been admitted to the hospital within the last month, diagnosed with acute pancreatitis External records from outside source obtained and reviewed including prior CT scan and admission to the hospital record.  His lipase today is much higher than it was in the past, his metabolic panel also shows some renal insufficiency which is getting worse.  His AST and ALT are around 48 and 68.  CT scan at the prior visit showed enlargement of the pancreatic head with some fluid collection surrounding it.  The patient ultimately left AMA from that visit   Lab Tests:  I Ordered, and personally interpreted labs.  The pertinent results include: Lipase which is significantly elevated, CBC which is, no leukocytosis, no significant anemia or thrombocytopenia.  Metabolic panel does show slight transaminitis and worsening renal  dysfunction   Imaging Studies ordered:  I ordered imaging studies including CT scan of the abdomen and pelvis without contrast secondary to renal dysfunction I independently visualized and interpreted imaging which showed ongoing pancreatitis with some new inflammatory changes around the head of the pancreas I agree with the radiologist interpretation   Cardiac Monitoring: / EKG:  The patient was maintained on a cardiac monitor.  I personally viewed and interpreted the cardiac monitored which showed an underlying rhythm of: Normal sinus rhythm without tachycardia, he is hypertensive   Consultations Obtained:  I requested consultation with the hospitalist Dr. Mariea Clonts,  and discussed lab and imaging findings as well as pertinent plan - they recommend: Admission to the hospital   Problem List / ED Course / Critical interventions / Medication management  Dilaudid and IV fluids were given CT scan reassuring for no necrotizing infections I ordered medication including Dilaudid, and IV fluids for pancreatitis Reevaluation of the patient after these medicines showed that the patient improved but needs admission to the hospital secondary to renal insufficiency I have reviewed the patients home medicines and have made adjustments as needed   Social Determinants of Health:  Chronic alcohol, states he has not drank in over a month, questionable   Test / Admission - Considered:  We will need to be admitted to the hospital         Final Clinical Impression(s) / ED Diagnoses Final diagnoses:  Alcohol-induced acute pancreatitis, unspecified complication status  AKI (acute kidney injury) [facility])    Rx / DC Orders ED Discharge Orders     None         Eber Hong, MD 06/04/22 2114

## 2022-06-05 DIAGNOSIS — K852 Alcohol induced acute pancreatitis without necrosis or infection: Secondary | ICD-10-CM | POA: Diagnosis not present

## 2022-06-05 LAB — GLUCOSE, CAPILLARY
Glucose-Capillary: 113 mg/dL — ABNORMAL HIGH (ref 70–99)
Glucose-Capillary: 107 mg/dL — ABNORMAL HIGH (ref 70–99)
Glucose-Capillary: 102 mg/dL — ABNORMAL HIGH (ref 70–99)

## 2022-06-05 LAB — COMPREHENSIVE METABOLIC PANEL
ALT: 56 U/L — ABNORMAL HIGH (ref 0–44)
AST: 37 U/L (ref 15–41)
Albumin: 3.2 g/dL — ABNORMAL LOW (ref 3.5–5.0)
Alkaline Phosphatase: 46 U/L (ref 38–126)
Anion gap: 7 (ref 5–15)
BUN: 13 mg/dL (ref 6–20)
CO2: 23 mmol/L (ref 22–32)
Calcium: 8.3 mg/dL — ABNORMAL LOW (ref 8.9–10.3)
Chloride: 109 mmol/L (ref 98–111)
Creatinine, Ser: 1.61 mg/dL — ABNORMAL HIGH (ref 0.61–1.24)
GFR, Estimated: 51 mL/min — ABNORMAL LOW (ref >60)
Glucose, Bld: 104 mg/dL — ABNORMAL HIGH (ref 70–99)
Potassium: 4.2 mmol/L (ref 3.5–5.1)
Sodium: 139 mmol/L (ref 135–145)
Total Bilirubin: 1.1 mg/dL (ref 0.3–1.2)
Total Protein: 6.2 g/dL — ABNORMAL LOW (ref 6.5–8.1)

## 2022-06-05 LAB — CBC
HCT: 35.9 % — ABNORMAL LOW (ref 39.0–52.0)
Hemoglobin: 11.6 g/dL — ABNORMAL LOW (ref 13.0–17.0)
MCH: 31.5 pg (ref 26.0–34.0)
MCHC: 32.3 g/dL (ref 30.0–36.0)
MCV: 97.6 fL (ref 80.0–100.0)
Platelets: 240 10*3/uL (ref 150–400)
RBC: 3.68 MIL/uL — ABNORMAL LOW (ref 4.22–5.81)
RDW: 15.4 % (ref 11.5–15.5)
WBC: 6.7 10*3/uL (ref 4.0–10.5)
nRBC: 0 % (ref 0.0–0.2)

## 2022-06-05 NOTE — Progress Notes (Signed)
Pt is refusing to remain NPO. He drink a soda out of his personal belonging. Pt also stated he wanted to leave AMA. Md made aware.

## 2022-06-05 NOTE — Progress Notes (Signed)
  Transition of Care [facility]) Screening Note   Patient Details  Name: Taylor Obrien Date of Birth: 11-14-1966   Transition of Care [facility]) CM/SW Contact:    Iona Beard, [place] Phone Number: 06/05/2022, 8:30 AM    Transition of Care Department [facility]) has reviewed patient and no TOC needs have been identified at this time. We will continue to monitor patient advancement through interdisciplinary progression rounds. If new patient transition needs arise, please place a TOC consult.

## 2022-06-06 ENCOUNTER — Emergency Department (HOSPITAL_COMMUNITY)
Admission: EM | Admit: 2022-06-06 | Discharge: 2022-06-06 | Disposition: A | Discharge status: Home / Self Care | Payer: Medicare Other | Attending: Emergency Medicine | Admitting: Emergency Medicine

## 2022-06-06 ENCOUNTER — Encounter (HOSPITAL_COMMUNITY): Payer: Self-pay | Admitting: *Deleted

## 2022-06-06 ENCOUNTER — Other Ambulatory Visit: Payer: Self-pay

## 2022-06-06 DIAGNOSIS — I10 Essential (primary) hypertension: Secondary | ICD-10-CM | POA: Insufficient documentation

## 2022-06-06 DIAGNOSIS — R1012 Left upper quadrant pain: Secondary | ICD-10-CM | POA: Diagnosis not present

## 2022-06-06 DIAGNOSIS — K859 Acute pancreatitis without necrosis or infection, unspecified: Secondary | ICD-10-CM

## 2022-06-06 DIAGNOSIS — Z7982 Long term (current) use of aspirin: Secondary | ICD-10-CM | POA: Diagnosis not present

## 2022-06-06 DIAGNOSIS — R1011 Right upper quadrant pain: Secondary | ICD-10-CM | POA: Diagnosis present

## 2022-06-06 DIAGNOSIS — Z79899 Other long term (current) drug therapy: Secondary | ICD-10-CM | POA: Insufficient documentation

## 2022-06-06 DIAGNOSIS — R1013 Epigastric pain: Secondary | ICD-10-CM | POA: Diagnosis not present

## 2022-06-06 LAB — CBC
HCT: 37.4 % — ABNORMAL LOW (ref 39.0–52.0)
Hemoglobin: 11.7 g/dL — ABNORMAL LOW (ref 13.0–17.0)
MCH: 31.2 pg (ref 26.0–34.0)
MCHC: 31.3 g/dL (ref 30.0–36.0)
MCV: 99.7 fL (ref 80.0–100.0)
Platelets: 285 10*3/uL (ref 150–400)
RBC: 3.75 MIL/uL — ABNORMAL LOW (ref 4.22–5.81)
RDW: 15.3 % (ref 11.5–15.5)
WBC: 7.5 10*3/uL (ref 4.0–10.5)
nRBC: 0 % (ref 0.0–0.2)

## 2022-06-06 LAB — LIPASE, BLOOD: Lipase: 148 U/L — ABNORMAL HIGH (ref 11–51)

## 2022-06-06 LAB — COMPREHENSIVE METABOLIC PANEL
ALT: 54 U/L — ABNORMAL HIGH (ref 0–44)
AST: 39 U/L (ref 15–41)
Albumin: 3.4 g/dL — ABNORMAL LOW (ref 3.5–5.0)
Alkaline Phosphatase: 49 U/L (ref 38–126)
Anion gap: 8 (ref 5–15)
BUN: 11 mg/dL (ref 6–20)
CO2: 25 mmol/L (ref 22–32)
Calcium: 8.8 mg/dL — ABNORMAL LOW (ref 8.9–10.3)
Chloride: 110 mmol/L (ref 98–111)
Creatinine, Ser: 1.7 mg/dL — ABNORMAL HIGH (ref 0.61–1.24)
GFR, Estimated: 48 mL/min — ABNORMAL LOW (ref >60)
Glucose, Bld: 106 mg/dL — ABNORMAL HIGH (ref 70–99)
Potassium: 4.3 mmol/L (ref 3.5–5.1)
Sodium: 143 mmol/L (ref 135–145)
Total Bilirubin: 0.5 mg/dL (ref 0.3–1.2)
Total Protein: 6.8 g/dL (ref 6.5–8.1)

## 2022-06-06 LAB — URINALYSIS, ROUTINE W REFLEX MICROSCOPIC
Bilirubin Urine: NEGATIVE
Glucose, UA: >=500 mg/dL — AB
Ketones, ur: NEGATIVE mg/dL
Leukocytes,Ua: NEGATIVE
Nitrite: NEGATIVE
Protein, ur: 100 mg/dL — AB
Specific Gravity, Urine: 1.022 (ref 1.005–1.030)
pH: 5 (ref 5.0–8.0)

## 2022-06-06 MED ORDER — OXYCODONE HCL 5 MG PO TABS
10.0000 mg | ORAL_TABLET | Freq: Once | ORAL | Status: AC
  Administered 2022-06-06: 10 mg via ORAL
  Filled 2022-06-06: qty 2

## 2022-06-06 MED ORDER — HYDROCODONE-ACETAMINOPHEN 5-325 MG PO TABS: 1.0000 | ORAL_TABLET | Freq: Four times a day (QID) | ORAL | 0 refills | Status: DC | PRN

## 2022-06-06 MED ORDER — SODIUM CHLORIDE 0.9 % IV BOLUS: 1000.0000 mL | Freq: Once | INTRAVENOUS | Status: DC

## 2022-06-06 NOTE — ED Provider Triage Note (Signed)
Emergency Medicine Provider Triage Evaluation Note  Taylor Obrien , a 56 y.o. male  was evaluated in triage.  Pt complains of abdominal pain localized to the right upper and right lower quadrants.  Patient was seen evaluated in the emergency department on 06/04/2022 and was diagnosed with pancreatitis.  Lipase at that time was over thousand.  Patient was admitted at that time and ultimately signed out [name] after staying 1 day. Patient states he has not had any alcohol in the last 3 months.  He denies any nausea, vomiting, diarrhea, fever, chills.  Patient does endorse some hematuria.  Patient has not been taking any medications at home.  Review of Systems  Positive:  Negative: See above  Physical Exam  BP (!) 155/98   Pulse 89   Temp 98.6 F (37 C) (Oral)   Resp 19   SpO2 99%  Gen:   Awake, no distress   Resp:  Normal effort  MSK:   Moves extremities without difficulty  Other:  Tenderness to the right upper and right lower quadrants of the abdomen.  Patient does have an obese abdomen but it is soft.  Medical Decision Making  Medically screening exam initiated at 10:24 PM.  Appropriate orders placed.  Taylor Obrien was informed that the remainder of the evaluation will be completed by another provider, this initial triage assessment does not replace that evaluation, and the importance of remaining in the ED until their evaluation is complete.  I personally reviewed the labs that were ordered in triage.  CMP reveals elevated creatinine but this seems to be at the patient's baseline.  Transaminitis has improved in comparison to previous.  Urinalysis shows evidence of hematuria and glucosuria without infection.  Lipase 148 today.  When to get the patient some fluids in addition to pain medication.  Disposition to be made by oncoming provider.   [name], [place] 06/06/22 2227

## 2022-06-06 NOTE — ED Provider Notes (Signed)
[facility] EMERGENCY DEPARTMENT Provider Note   CSN: [id number] Arrival date & time: 06/06/22  1845     History  Chief Complaint  Patient presents with   Abdominal Pain    Taylor Obrien is a 56 y.o. male.  Patient is a 56 year old male with past medical history of prior CVA, hypertension, hyperlipidemia, prior alcohol abuse with pancreatitis.  Patient presenting today with complaints of abdominal pain.  He was seen here yesterday and admitted, but left shortly afterward [place].  He returns with ongoing abdominal discomfort.  He denies any fevers or chills.  He denies any diarrhea or vomiting.  There are no alleviating factors.  He does report worsening pain with palpation and movement.  The history is provided by the patient.       Home Medications Prior to Admission medications   Medication Sig Start Date End Date Taking? Authorizing Provider  amLODipine (NORVASC) 10 MG tablet Take 1 tablet (10 mg total) by mouth daily. 01/19/16   Kirsteins, [name], MD  aspirin 325 MG tablet Take 1 tablet (325 mg total) by mouth daily. 12/20/15   Love, Ivan Anchors, PA-C  blood glucose meter kit and supplies KIT Dispense based on patient and insurance preference. Use up to four times daily as directed. (FOR ICD-9 250.00, 250.01). 03/18/17   Forde Dandy, MD  cloNIDine (CATAPRES - DOSED IN MG/24 HR) 0.3 mg/24hr patch Place 1 patch (0.3 mg total) onto the skin once a week. Next change on monday Patient taking differently: Place 0.3 mg onto the skin every Wednesday. Next change on monday 12/20/15   Love, Ivan Anchors, PA-C  diphenhydramine-acetaminophen (TYLENOL PM) 25-500 MG TABS tablet Take 1-2 tablets by mouth at bedtime as needed (for sleep). Depends on insomnia if takes 1-2 tablets    [provider]  empagliflozin (JARDIANCE) 25 MG TABS tablet Take 25 mg by mouth daily.     [provider]  escitalopram (LEXAPRO) 10 MG tablet Take 10 mg by mouth daily.    [provider]  fenofibrate (TRICOR) 145 MG tablet Take 145 mg by mouth daily. 04/10/22   [provider]  gabapentin (NEURONTIN) 300 MG capsule Take 300 mg by mouth 2 (two) times daily. 05/09/22   [provider]  glucose blood test strip Use as instructed 03/18/17   Forde Dandy, MD  hydrALAZINE (APRESOLINE) 50 MG tablet Take 1 tablet (50 mg total) by mouth 2 (two) times daily. Patient not taking: Reported on 06/05/2022 04/30/22   [name], MD  hydrOXYzine (ATARAX) 25 MG tablet Take 25 mg by mouth every 8 (eight) hours as needed for anxiety. 04/17/22   [provider]  labetalol (NORMODYNE) 300 MG tablet Take 300 mg by mouth 2 (two) times daily. 03/08/22   [provider]  metFORMIN (GLUCOPHAGE) 500 MG tablet Take 1 tablet (500 mg total) by mouth 2 (two) times daily with a meal. 03/18/17   Forde Dandy, MD  nicotine (NICODERM CQ - DOSED IN MG/24 HOURS) 14 mg/24hr patch Place 14 mg onto the skin daily. 05/10/22   [provider]  pantoprazole (PROTONIX) 40 MG tablet Take 1 tablet (40 mg total) by mouth at bedtime. 01/19/16   Kirsteins, [name], MD  traZODone (DESYREL) 100 MG tablet Take 1 tablet (100 mg total) by mouth at bedtime. 01/19/16   Kirsteins, [name], MD  triamcinolone cream (KENALOG) 0.1 % Apply 1 application. topically daily.    [provider]  VASCEPA 1 g capsule Take 2 g by mouth 2 (two) times daily. 03/12/22   [provider]      Allergies    Oxcarbazepine    Review of Systems   Review of Systems  All other systems reviewed and are negative.   Physical Exam Updated Vital Signs BP (!) 155/98   Pulse 89   Temp 98.6 F (37 C) (Oral)   Resp 19   SpO2 99%  Physical Exam Vitals and nursing note reviewed.  Constitutional:      General: He is not in acute distress.    Appearance: He is well-developed. He is not diaphoretic.  HENT:     Head: Normocephalic and atraumatic.  Cardiovascular:     Rate and Rhythm: Normal  rate and regular rhythm.     Heart sounds: No murmur heard.    No friction rub.  Pulmonary:     Effort: Pulmonary effort is normal. No respiratory distress.     Breath sounds: Normal breath sounds. No wheezing or rales.  Abdominal:     General: Bowel sounds are normal. There is no distension.     Palpations: Abdomen is soft.     Tenderness: There is abdominal tenderness in the right upper quadrant, epigastric area and left upper quadrant. There is no right CVA tenderness, left CVA tenderness, guarding or rebound.     Comments: There is mild tenderness to palpation across the upper abdomen.  There is no rebound or guarding.  Musculoskeletal:        General: Normal range of motion.     Cervical back: Normal range of motion and neck supple.  Skin:    General: Skin is warm and dry.  Neurological:     Mental Status: He is alert and oriented to person, place, and time.     Coordination: Coordination normal.     ED Results / Procedures / Treatments   Labs (all labs ordered are listed, but only abnormal results are displayed) Labs Reviewed  LIPASE, BLOOD - Abnormal; Notable for the following components:      Result Value   Lipase 148 (*)    All other components within normal limits  COMPREHENSIVE METABOLIC PANEL - Abnormal; Notable for the following components:   Glucose, Bld 106 (*)    Creatinine, Ser 1.70 (*)    Calcium 8.8 (*)    Albumin 3.4 (*)    ALT 54 (*)    GFR, Estimated 48 (*)    All other components within normal limits  CBC - Abnormal; Notable for the following components:   RBC 3.75 (*)    Hemoglobin 11.7 (*)    HCT 37.4 (*)    All other components within normal limits  URINALYSIS, ROUTINE W REFLEX MICROSCOPIC - Abnormal; Notable for the following components:   Glucose, UA >=500 (*)    Hgb urine dipstick SMALL (*)    Protein, ur 100 (*)    Bacteria, UA RARE (*)    All other components within normal limits    EKG None  Radiology No results  found.  Procedures Procedures    Medications Ordered in ED Medications  oxyCODONE (Oxy IR/ROXICODONE) immediate release tablet 10 mg (10 mg Oral Given 06/06/22 2232)    ED Course/ Medical Decision Making/ A&P  Patient seen and admitted yesterday for pancreatitis.  He returns today with ongoing pain after signing out [place] yesterday.  His abdominal exam is benign and lipase is trending significantly downward.  It  was 1500 upon admission, but is now 150.  Patient is nontoxic-appearing, afebrile, and I believe can safely be discharged.  He will be prescribed medication for pain and advised to adhere to a clear liquid diet.  To follow-up with primary doctor.  Final Clinical Impression(s) / ED Diagnoses Final diagnoses:  None    Rx / DC Orders ED Discharge Orders     None         [name], MD 06/06/22 2311

## 2022-06-06 NOTE — Discharge Instructions (Addendum)
Take hydrocodone as prescribed as needed for pain.  Adhere to a clear liquid diet for the next 24 hours, then slowly advance to normal as tolerated.  Return to the emergency department if symptoms significantly worsen or change.

## 2022-06-06 NOTE — ED Triage Notes (Signed)
Pt with continued right sided abd pain.  Pt seen for same on 6/27. Denies any N/V/D

## 2022-06-07 NOTE — Discharge Summary (Signed)
Physician Discharge Summary   Patient: Taylor Obrien MRN: [id number] DOB: June 05, 1966  Admit date:     06/04/2022  Discharge date: 06/05/2022  Discharge Physician: Hosie Poisson   PCP: Darrol Jump, NP   Recommendations at discharge:  Pt left AMA.   Discharge Diagnoses: Principal Problem:   Acute pancreatitis without infection or necrosis Active Problems:   Transaminasemia   Diabetes mellitus ([place])   Tobacco abuse   Stroke (cerebrum) ([place])   Essential hypertension   Stage 3a chronic kidney disease (CKD) [facility])   Hospital Course: MALIKIAH DEBARR is a 56 y.o. male with medical history significant for alcohol abuse, diabetes mellitus, chronic pancreatitis. Patient presented to the ED with complaints of right-sided abdominal pain over the past week, radiating to the back  Assessment and Plan: *  PT LEFT AMA.  Allergies as of 06/05/2022       Reactions   Oxcarbazepine Other (See Comments)   Patient goes out of right state of mind.         Medication List     ASK your doctor about these medications    amLODipine 10 MG tablet Commonly known as: NORVASC Take 1 tablet (10 mg total) by mouth daily.   aspirin 325 MG tablet Take 1 tablet (325 mg total) by mouth daily.   blood glucose meter kit and supplies Kit Dispense based on patient and insurance preference. Use up to four times daily as directed. (FOR ICD-9 250.00, 250.01).   cloNIDine 0.3 mg/24hr patch Commonly known as: CATAPRES - Dosed in mg/24 hr Place 1 patch (0.3 mg total) onto the skin once a week. Next change on monday   diphenhydramine-acetaminophen 25-500 MG Tabs tablet Commonly known as: TYLENOL PM Take 1-2 tablets by mouth at bedtime as needed (for sleep). Depends on insomnia if takes 1-2 tablets   empagliflozin 25 MG Tabs tablet Commonly known as: JARDIANCE Take 25 mg by mouth daily.   escitalopram 10 MG tablet Commonly known as: LEXAPRO Take 10 mg by mouth daily.   fenofibrate 145 MG  tablet Commonly known as: TRICOR Take 145 mg by mouth daily.   gabapentin 300 MG capsule Commonly known as: NEURONTIN Take 300 mg by mouth 2 (two) times daily.   glucose blood test strip Use as instructed   hydrALAZINE 50 MG tablet Commonly known as: APRESOLINE Take 1 tablet (50 mg total) by mouth 2 (two) times daily.   hydrOXYzine 25 MG tablet Commonly known as: ATARAX Take 25 mg by mouth every 8 (eight) hours as needed for anxiety.   labetalol 300 MG tablet Commonly known as: NORMODYNE Take 300 mg by mouth 2 (two) times daily.   metFORMIN 500 MG tablet Commonly known as: GLUCOPHAGE Take 1 tablet (500 mg total) by mouth 2 (two) times daily with a meal.   nicotine 14 mg/24hr patch Commonly known as: NICODERM CQ - dosed in mg/24 hours Place 14 mg onto the skin daily.   pantoprazole 40 MG tablet Commonly known as: PROTONIX Take 1 tablet (40 mg total) by mouth at bedtime.   traZODone 100 MG tablet Commonly known as: DESYREL Take 1 tablet (100 mg total) by mouth at bedtime.   triamcinolone cream 0.1 % Commonly known as: KENALOG Apply 1 application. topically daily.   Vascepa 1 g capsule Generic drug: icosapent Ethyl Take 2 g by mouth 2 (two) times daily.        Discharge Exam: Filed Weights   06/04/22 1617 06/04/22 2243  Weight: 97.5 kg 99.8  kg    The results of significant diagnostics from this hospitalization (including imaging, microbiology, ancillary and laboratory) are listed below for reference.   Imaging Studies: CT ABDOMEN PELVIS WO CONTRAST  Result Date: 06/04/2022 CLINICAL DATA:  Right-sided abdominal pain for 1 week. Patient with history of pancreatitis. EXAM: CT ABDOMEN AND PELVIS WITHOUT CONTRAST TECHNIQUE: Multidetector CT imaging of the abdomen and pelvis was performed following the standard protocol without IV contrast. RADIATION DOSE REDUCTION: This exam was performed according to the departmental dose-optimization program which includes  automated exposure control, adjustment of the mA and/or kV according to patient size and/or use of iterative reconstruction technique. COMPARISON:  Most recent CT 04/24/2022, ultrasound 04/26/2022 FINDINGS: Lower chest: Patchy atelectasis in both lung bases. Mild cardiomegaly with coronary artery calcifications. No pleural fluid. Hepatobiliary: Borderline subjective hepatic steatosis. No evidence of focal liver lesion. Gallbladder is partially distended. Layering density likely corresponds to sludge seen on prior ultrasound. No calcified gallstone. Common bile duct is difficult to delineate but appears upper normal measuring 7 mm, series 5, image 46. Pancreas: Increased peripancreatic fat stranding about the head and uncinate process. The previous fluid collections within the pancreatic head and body are less well-defined on this current unenhanced exam, there is vague low-density in these regions. No pancreatic air. No obvious new collection allowing for noncontrast technique. There is no ductal dilatation. Spleen: Normal in size without focal abnormality. Adrenals/Urinary Tract: No adrenal nodule. Punctate nonobstructing stone in the lower right kidney. Calcifications of both renal hila are felt to be vascular. No hydronephrosis there is faint symmetric perinephric edema. The previously described right renal lesion is not well appreciated on the current noncontrast exam. No ureteral calculus. Partially distended urinary bladder without wall thickening. Stomach/Bowel: Detailed bowel assessment is limited in the absence of enteric contrast. Small hiatal hernia. No abnormal gastric distension. No small bowel obstruction or inflammatory change. High-riding cecum in the right mid abdomen. Air-filled appendix without appendicitis. Multifocal colonic diverticulosis, no diverticulitis or acute colonic inflammation. Moderate volume of colonic stool. Vascular/Lymphatic: Age advanced aorto bi-iliac atherosclerosis. No aortic  aneurysm. No portal venous or mesenteric gas. Circumaortic left renal vein. No obvious bulky adenopathy in this unenhanced exam. Reproductive: Prostate is unremarkable. Other: Peripancreatic fat stranding and inflammation. No significant ascites or other free fluid. Trace fat in the inguinal canals. Musculoskeletal: There are no acute or suspicious osseous abnormalities. Minor lumbar spondylosis. IMPRESSION: 1. Increased peripancreatic fat stranding about the head and uncinate process, consistent with acute pancreatitis. The previous fluid collections within the pancreatic head and body on CT last month are less well-defined on this current unenhanced exam, there is vague low-density in these regions. No pancreatic gas or definite new peripancreatic collection. 2. Punctate nonobstructing stone in the lower right kidney. 3. Colonic diverticulosis without diverticulitis. 4. 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 5. The indeterminate right renal lesion on prior CT is not well-defined on this unenhanced exam, please reference recommendations detailed on 04/24/2022 abdominopelvic CT. Aortic Atherosclerosis (ICD10-I70.0). Electronically Signed   By: Keith Rake M.D.   On: 06/04/2022 20:28    Microbiology: Results for orders placed or performed during the hospital encounter of 04/24/22  Culture, blood (Routine X 2) w Reflex to ID Panel     Status: None   Collection Time: 04/24/22 12:51 PM   Specimen: BLOOD LEFT HAND  Result Value Ref Range Status   Specimen Description BLOOD LEFT HAND  Final   Special Requests   Final    BOTTLES DRAWN AEROBIC AND  ANAEROBIC Blood Culture results may not be optimal due to an inadequate volume of blood received in culture bottles   Culture   Final    NO GROWTH 5 DAYS Performed at [facility], [address], [place], [place] [address]    Report Status 04/29/2022 FINAL  Final  Culture, blood (Routine X 2) w Reflex to ID Panel     Status: None   Collection Time:  04/24/22 12:51 PM   Specimen: BLOOD RIGHT HAND  Result Value Ref Range Status   Specimen Description BLOOD RIGHT HAND  Final   Special Requests   Final    BOTTLES DRAWN AEROBIC AND ANAEROBIC Blood Culture results may not be optimal due to an inadequate volume of blood received in culture bottles   Culture   Final    NO GROWTH 5 DAYS Performed at [facility], [address], [place], [place] [address]    Report Status 04/29/2022 FINAL  Final    Labs: CBC: Recent Labs  Lab 06/04/22 1720 06/05/22 0532 06/06/22 1944  WBC 8.1 6.7 7.5  HGB 12.4* 11.6* 11.7*  HCT 39.2 35.9* 37.4*  MCV 98.5 97.6 99.7  PLT 277 240 957   Basic Metabolic Panel: Recent Labs  Lab 06/04/22 1720 06/05/22 0532 06/06/22 1944  NA 141 139 143  K 4.6 4.2 4.3  CL 106 109 110  CO2 27 23 25   GLUCOSE 109* 104* 106*  BUN 14 13 11   CREATININE 1.86* 1.61* 1.70*  CALCIUM 8.8* 8.3* 8.8*   Liver Function Tests: Recent Labs  Lab 06/04/22 1720 06/05/22 0532 06/06/22 1944  AST 48* 37 39  ALT 68* 56* 54*  ALKPHOS 51 46 49  BILITOT 0.8 1.1 0.5  PROT 7.0 6.2* 6.8  ALBUMIN 3.6 3.2* 3.4*   CBG: Recent Labs  Lab 06/04/22 2333 06/05/22 0358 06/05/22 0714 06/05/22 1119  GLUCAP 117* 113* 107* 102*      Signed: Hosie Poisson, MD [name] 06/07/2022

## 2022-06-14 ENCOUNTER — Other Ambulatory Visit: Payer: Self-pay

## 2022-06-14 ENCOUNTER — Inpatient Hospital Stay (HOSPITAL_COMMUNITY): Payer: Medicare Other

## 2022-06-14 ENCOUNTER — Inpatient Hospital Stay (HOSPITAL_COMMUNITY)
Admission: EM | Admit: 2022-06-14 | Discharge: 2022-06-17 | DRG: 439 | Disposition: A | Discharge status: Home / Self Care | Payer: Medicare Other | Attending: Internal Medicine | Admitting: Internal Medicine

## 2022-06-14 ENCOUNTER — Encounter (HOSPITAL_COMMUNITY): Payer: Self-pay | Admitting: Emergency Medicine

## 2022-06-14 DIAGNOSIS — R748 Abnormal levels of other serum enzymes: Secondary | ICD-10-CM

## 2022-06-14 DIAGNOSIS — F17210 Nicotine dependence, cigarettes, uncomplicated: Secondary | ICD-10-CM | POA: Diagnosis present

## 2022-06-14 DIAGNOSIS — K861 Other chronic pancreatitis: Secondary | ICD-10-CM | POA: Diagnosis present

## 2022-06-14 DIAGNOSIS — R1010 Upper abdominal pain, unspecified: Secondary | ICD-10-CM | POA: Diagnosis not present

## 2022-06-14 DIAGNOSIS — E1122 Type 2 diabetes mellitus with diabetic chronic kidney disease: Secondary | ICD-10-CM | POA: Diagnosis present

## 2022-06-14 DIAGNOSIS — R109 Unspecified abdominal pain: Secondary | ICD-10-CM

## 2022-06-14 DIAGNOSIS — E8809 Other disorders of plasma-protein metabolism, not elsewhere classified: Secondary | ICD-10-CM | POA: Diagnosis present

## 2022-06-14 DIAGNOSIS — Z813 Family history of other psychoactive substance abuse and dependence: Secondary | ICD-10-CM | POA: Diagnosis not present

## 2022-06-14 DIAGNOSIS — Z7982 Long term (current) use of aspirin: Secondary | ICD-10-CM

## 2022-06-14 DIAGNOSIS — D649 Anemia, unspecified: Secondary | ICD-10-CM | POA: Diagnosis present

## 2022-06-14 DIAGNOSIS — Z79899 Other long term (current) drug therapy: Secondary | ICD-10-CM

## 2022-06-14 DIAGNOSIS — K219 Gastro-esophageal reflux disease without esophagitis: Secondary | ICD-10-CM | POA: Diagnosis present

## 2022-06-14 DIAGNOSIS — E441 Mild protein-calorie malnutrition: Secondary | ICD-10-CM | POA: Diagnosis present

## 2022-06-14 DIAGNOSIS — N1831 Chronic kidney disease, stage 3a: Secondary | ICD-10-CM | POA: Diagnosis present

## 2022-06-14 DIAGNOSIS — E119 Type 2 diabetes mellitus without complications: Secondary | ICD-10-CM

## 2022-06-14 DIAGNOSIS — I69354 Hemiplegia and hemiparesis following cerebral infarction affecting left non-dominant side: Secondary | ICD-10-CM

## 2022-06-14 DIAGNOSIS — R112 Nausea with vomiting, unspecified: Secondary | ICD-10-CM

## 2022-06-14 DIAGNOSIS — E782 Mixed hyperlipidemia: Secondary | ICD-10-CM | POA: Diagnosis present

## 2022-06-14 DIAGNOSIS — Z833 Family history of diabetes mellitus: Secondary | ICD-10-CM | POA: Diagnosis not present

## 2022-06-14 DIAGNOSIS — I10 Essential (primary) hypertension: Secondary | ICD-10-CM

## 2022-06-14 DIAGNOSIS — K859 Acute pancreatitis without necrosis or infection, unspecified: Secondary | ICD-10-CM | POA: Diagnosis present

## 2022-06-14 DIAGNOSIS — I639 Cerebral infarction, unspecified: Secondary | ICD-10-CM | POA: Diagnosis present

## 2022-06-14 DIAGNOSIS — F1010 Alcohol abuse, uncomplicated: Secondary | ICD-10-CM | POA: Diagnosis present

## 2022-06-14 DIAGNOSIS — Z7984 Long term (current) use of oral hypoglycemic drugs: Secondary | ICD-10-CM | POA: Diagnosis not present

## 2022-06-14 DIAGNOSIS — G4733 Obstructive sleep apnea (adult) (pediatric): Secondary | ICD-10-CM | POA: Diagnosis present

## 2022-06-14 DIAGNOSIS — E46 Unspecified protein-calorie malnutrition: Secondary | ICD-10-CM | POA: Diagnosis not present

## 2022-06-14 DIAGNOSIS — Z83438 Family history of other disorder of lipoprotein metabolism and other lipidemia: Secondary | ICD-10-CM

## 2022-06-14 DIAGNOSIS — Z720 Tobacco use: Secondary | ICD-10-CM | POA: Diagnosis not present

## 2022-06-14 DIAGNOSIS — I129 Hypertensive chronic kidney disease with stage 1 through stage 4 chronic kidney disease, or unspecified chronic kidney disease: Secondary | ICD-10-CM | POA: Diagnosis present

## 2022-06-14 DIAGNOSIS — Z8249 Family history of ischemic heart disease and other diseases of the circulatory system: Secondary | ICD-10-CM | POA: Diagnosis not present

## 2022-06-14 LAB — GLUCOSE, CAPILLARY: Glucose-Capillary: 94 mg/dL (ref 70–99)

## 2022-06-14 LAB — COMPREHENSIVE METABOLIC PANEL
ALT: 25 U/L (ref 0–44)
AST: 23 U/L (ref 15–41)
Albumin: 3.4 g/dL — ABNORMAL LOW (ref 3.5–5.0)
Alkaline Phosphatase: 39 U/L (ref 38–126)
Anion gap: 7 (ref 5–15)
BUN: 15 mg/dL (ref 6–20)
CO2: 24 mmol/L (ref 22–32)
Calcium: 8.6 mg/dL — ABNORMAL LOW (ref 8.9–10.3)
Chloride: 104 mmol/L (ref 98–111)
Creatinine, Ser: 1.69 mg/dL — ABNORMAL HIGH (ref 0.61–1.24)
GFR, Estimated: 48 mL/min — ABNORMAL LOW (ref >60)
Glucose, Bld: 133 mg/dL — ABNORMAL HIGH (ref 70–99)
Potassium: 3.9 mmol/L (ref 3.5–5.1)
Sodium: 135 mmol/L (ref 135–145)
Total Bilirubin: 0.7 mg/dL (ref 0.3–1.2)
Total Protein: 6.9 g/dL (ref 6.5–8.1)

## 2022-06-14 LAB — URINALYSIS, ROUTINE W REFLEX MICROSCOPIC
Bacteria, UA: NONE SEEN
Bilirubin Urine: NEGATIVE
Glucose, UA: >=500 mg/dL — AB
Hgb urine dipstick: NEGATIVE
Ketones, ur: NEGATIVE mg/dL
Leukocytes,Ua: NEGATIVE
Nitrite: NEGATIVE
Protein, ur: 100 mg/dL — AB
Specific Gravity, Urine: 1.006 (ref 1.005–1.030)
pH: 6 (ref 5.0–8.0)

## 2022-06-14 LAB — ETHANOL: Alcohol, Ethyl (B): <10 mg/dL (ref <10)

## 2022-06-14 LAB — CBC
HCT: 36.9 % — ABNORMAL LOW (ref 39.0–52.0)
Hemoglobin: 11.8 g/dL — ABNORMAL LOW (ref 13.0–17.0)
MCH: 30.8 pg (ref 26.0–34.0)
MCHC: 32 g/dL (ref 30.0–36.0)
MCV: 96.3 fL (ref 80.0–100.0)
Platelets: 320 10*3/uL (ref 150–400)
RBC: 3.83 MIL/uL — ABNORMAL LOW (ref 4.22–5.81)
RDW: 15.5 % (ref 11.5–15.5)
WBC: 9.6 10*3/uL (ref 4.0–10.5)
nRBC: 0 % (ref 0.0–0.2)

## 2022-06-14 LAB — LIPASE, BLOOD: Lipase: 330 U/L — ABNORMAL HIGH (ref 11–51)

## 2022-06-14 MED ORDER — PANTOPRAZOLE SODIUM 40 MG IV SOLR
40.0000 mg | INTRAVENOUS | Status: DC
Start: 2022-06-14 — End: 2022-06-17
  Administered 2022-06-14 – 2022-06-15 (×2): 40 mg via INTRAVENOUS
  Filled 2022-06-14 (×2): qty 10

## 2022-06-14 MED ORDER — ENOXAPARIN SODIUM 40 MG/0.4ML IJ SOSY
40.0000 mg | PREFILLED_SYRINGE | INTRAMUSCULAR | Status: DC
  Administered 2022-06-14 – 2022-06-16 (×3): 40 mg via SUBCUTANEOUS
  Filled 2022-06-14 (×2): qty 0.4

## 2022-06-14 MED ORDER — LABETALOL HCL 200 MG PO TABS
300.0000 mg | ORAL_TABLET | Freq: Two times a day (BID) | ORAL | Status: DC
  Administered 2022-06-15 – 2022-06-17 (×5): 300 mg via ORAL
  Filled 2022-06-14 (×5): qty 2

## 2022-06-14 MED ORDER — ONDANSETRON HCL 4 MG/2ML IJ SOLN
4.0000 mg | Freq: Four times a day (QID) | INTRAMUSCULAR | Status: DC | PRN
  Filled 2022-06-14: qty 2

## 2022-06-14 MED ORDER — IOHEXOL 300 MG/ML  SOLN
100.0000 mL | Freq: Once | INTRAMUSCULAR | Status: AC | PRN
  Administered 2022-06-14: 100 mL via INTRAVENOUS

## 2022-06-14 MED ORDER — AMLODIPINE BESYLATE 5 MG PO TABS
10.0000 mg | ORAL_TABLET | Freq: Every day | ORAL | Status: DC
  Administered 2022-06-15 – 2022-06-17 (×3): 10 mg via ORAL
  Filled 2022-06-14 (×4): qty 2

## 2022-06-14 MED ORDER — HYDROMORPHONE HCL 1 MG/ML IJ SOLN: 0.5000 mg | INTRAMUSCULAR | Status: DC | PRN

## 2022-06-14 MED ORDER — ONDANSETRON HCL 4 MG PO TABS: 4.0000 mg | ORAL_TABLET | Freq: Four times a day (QID) | ORAL | Status: DC | PRN

## 2022-06-14 MED ORDER — ONDANSETRON HCL 4 MG/2ML IJ SOLN: 4.0000 mg | Freq: Four times a day (QID) | INTRAMUSCULAR | Status: DC | PRN

## 2022-06-14 MED ORDER — LACTATED RINGERS IV BOLUS
1000.0000 mL | Freq: Once | INTRAVENOUS | Status: AC
  Administered 2022-06-14: 1000 mL via INTRAVENOUS

## 2022-06-14 MED ORDER — MORPHINE SULFATE (PF) 2 MG/ML IV SOLN
2.0000 mg | INTRAVENOUS | Status: DC | PRN
  Administered 2022-06-14 – 2022-06-16 (×8): 2 mg via INTRAVENOUS
  Filled 2022-06-14 (×8): qty 1

## 2022-06-14 MED ORDER — GLUCERNA SHAKE PO LIQD
237.0000 mL | Freq: Three times a day (TID) | ORAL | Status: DC
  Administered 2022-06-15 – 2022-06-17 (×3): 237 mL via ORAL

## 2022-06-14 MED ORDER — INSULIN ASPART 100 UNIT/ML IJ SOLN
0.0000 [IU] | Freq: Three times a day (TID) | INTRAMUSCULAR | Status: DC
  Administered 2022-06-15: 2 [IU] via SUBCUTANEOUS
  Administered 2022-06-16: 3 [IU] via SUBCUTANEOUS
  Administered 2022-06-16 – 2022-06-17 (×2): 2 [IU] via SUBCUTANEOUS

## 2022-06-14 MED ORDER — ICOSAPENT ETHYL 1 G PO CAPS
2.0000 g | ORAL_CAPSULE | Freq: Two times a day (BID) | ORAL | Status: DC
  Filled 2022-06-14 (×5): qty 2

## 2022-06-14 MED ORDER — FENOFIBRATE 160 MG PO TABS
160.0000 mg | ORAL_TABLET | Freq: Every day | ORAL | Status: DC
  Administered 2022-06-15 – 2022-06-17 (×3): 160 mg via ORAL
  Filled 2022-06-14 (×3): qty 1

## 2022-06-14 MED ORDER — LACTATED RINGERS IV SOLN: INTRAVENOUS | Status: AC

## 2022-06-14 MED ORDER — MORPHINE SULFATE (PF) 4 MG/ML IV SOLN
4.0000 mg | Freq: Once | INTRAVENOUS | Status: AC
  Administered 2022-06-14: 4 mg via INTRAVENOUS
  Filled 2022-06-14: qty 1

## 2022-06-14 MED ORDER — ONDANSETRON HCL 4 MG/2ML IJ SOLN
4.0000 mg | Freq: Once | INTRAMUSCULAR | Status: AC
  Administered 2022-06-14: 4 mg via INTRAVENOUS
  Filled 2022-06-14: qty 2

## 2022-06-14 NOTE — ED Triage Notes (Signed)
Pt c/o all over abd pain x 1 week. Denies v/d. Vomited once 2 days ago. States "my pancreas is giving me a fit". Nad. Color wnl.

## 2022-06-14 NOTE — ED Provider Notes (Signed)
[facility] EMERGENCY DEPARTMENT Provider Note  CSN: [id number] Arrival date & time: 06/14/22 1455  Chief Complaint(s) Abdominal Pain  HPI Taylor Obrien is a 56 y.o. male with PMH alcoholic pancreatitis with recurrent hospital admissions for pancreatitis, T2DM, HTN, previous CVA who presents emergency department for evaluation of abdominal pain.  Patient was previously admitted in late June 2023 and left AMA, represented to the emergency department and was ultimately discharged with a prescription for pain medication.  He states that he has not drank alcohol since then but his pain is returned and is currently unable to tolerate p.o.'s secondary to nausea and vomiting.  He endorses epigastric pain with no associated fever, chest pain, shortness of breath or other systemic symptoms.   Past Medical History Past Medical History:  Diagnosis Date   Arthritis    Chronic pain    Depression    Diabetes mellitus without complication ([place])    Essential hypertension, benign    GERD (gastroesophageal reflux disease)    Headache    HTN (hypertension) 11/27/2015   Hyperlipidemia    Sleep apnea    cpap    Stroke ([place])    2016   Patient Active Problem List   Diagnosis Date Noted   Stage 3a chronic kidney disease (CKD) ([name]) 06/04/2022   Anemia    Acute pancreatitis without infection or necrosis    Transaminasemia 04/29/2022   Class 1 obesity 04/29/2022   Thrombocytosis 86/76/7209   Acute alcoholic pancreatitis 47/08/6282   Diabetes mellitus (Taylor Obrien) 04/24/2022   Post-operative state 01/24/2018   Spastic hemiplegia affecting nondominant side (Taylor Obrien) 06/03/2016   Dysuria    OSA (obstructive sleep apnea)    Hypokalemia    Elevated blood pressure    Hemiparesis affecting left side as late effect of stroke (Taylor Obrien)    Epistaxis    HTN (hypertension) 11/27/2015   Acute renal failure superimposed on stage 3a chronic kidney disease (Taylor Obrien) 11/27/2015   Hemiplegia and hemiparesis following unspecified  cerebrovascular disease affecting left non-dominant side (Taylor Obrien) 11/23/2015   Gait disturbance, post-stroke 11/23/2015   Cerebrovascular accident (CVA) due to occlusion of right anterior cerebral artery (Taylor Obrien)    Essential hypertension    Depression    Chronic pain syndrome    ETOH abuse    Marijuana abuse    Cerebrovascular accident (CVA) due to thrombosis of right carotid artery (Taylor Obrien)    Malignant hypertension    Mixed hyperlipidemia    CVA (cerebral infarction) 11/18/2015   Stroke (cerebrum) (Taylor Obrien) 11/18/2015   Chest pain 10/09/2012   Chronic back pain 10/09/2012   Tobacco abuse 10/09/2012   Hypertensive heart disease 08/31/2012   Accelerated hypertension 08/31/2012   Precordial pain 08/31/2012   Home Medication(s) Prior to Admission medications   Medication Sig Start Date End Date Taking? Authorizing Provider  amLODipine (NORVASC) 10 MG tablet Take 1 tablet (10 mg total) by mouth daily. 01/19/16   Kirsteins, [name], MD  aspirin 325 MG tablet Take 1 tablet (325 mg total) by mouth daily. 12/20/15   Love, Ivan Anchors, PA-C  blood glucose meter kit and supplies KIT Dispense based on patient and insurance preference. Use up to four times daily as directed. (FOR ICD-9 250.00, 250.01). 03/18/17   Forde Dandy, MD  cloNIDine (CATAPRES - DOSED IN MG/24 HR) 0.3 mg/24hr patch Place 1 patch (0.3 mg total) onto the skin once a week. Next change on monday Patient taking differently: Place 0.3 mg onto the skin every Wednesday. Next change on monday  12/20/15   Love, Ivan Anchors, PA-C  diphenhydramine-acetaminophen (TYLENOL PM) 25-500 MG TABS tablet Take 1-2 tablets by mouth at bedtime as needed (for sleep). Depends on insomnia if takes 1-2 tablets    [provider]  empagliflozin (JARDIANCE) 25 MG TABS tablet Take 25 mg by mouth daily.     [provider]  escitalopram (LEXAPRO) 10 MG tablet Take 10 mg by mouth daily.    [provider]  fenofibrate (TRICOR) 145 MG tablet Take 145  mg by mouth daily. 04/10/22   [provider]  gabapentin (NEURONTIN) 300 MG capsule Take 300 mg by mouth 2 (two) times daily. 05/09/22   [provider]  glucose blood test strip Use as instructed 03/18/17   Forde Dandy, MD  hydrALAZINE (APRESOLINE) 50 MG tablet Take 1 tablet (50 mg total) by mouth 2 (two) times daily. Patient not taking: Reported on 06/05/2022 04/30/22   [name], MD  HYDROcodone-acetaminophen (NORCO/VICODIN) 5-325 MG tablet Take 1-2 tablets by mouth every 6 (six) hours as needed. 06/06/22   [name], MD  hydrOXYzine (ATARAX) 25 MG tablet Take 25 mg by mouth every 8 (eight) hours as needed for anxiety. 04/17/22   [provider]  labetalol (NORMODYNE) 300 MG tablet Take 300 mg by mouth 2 (two) times daily. 03/08/22   [provider]  metFORMIN (GLUCOPHAGE) 500 MG tablet Take 1 tablet (500 mg total) by mouth 2 (two) times daily with a meal. 03/18/17   Forde Dandy, MD  nicotine (NICODERM CQ - DOSED IN MG/24 HOURS) 14 mg/24hr patch Place 14 mg onto the skin daily. 05/10/22   [provider]  pantoprazole (PROTONIX) 40 MG tablet Take 1 tablet (40 mg total) by mouth at bedtime. 01/19/16   Kirsteins, [name], MD  traZODone (DESYREL) 100 MG tablet Take 1 tablet (100 mg total) by mouth at bedtime. 01/19/16   Kirsteins, [name], MD  triamcinolone cream (KENALOG) 0.1 % Apply 1 application. topically daily.    [provider]  VASCEPA 1 g capsule Take 2 g by mouth 2 (two) times daily. 03/12/22   [provider]                                                                                                                                    Past Surgical History Past Surgical History:  Procedure Laterality Date   CLOSED REDUCTION MANDIBULAR FRACTURE W/ ARCH BARS     + multiple extractions   Condyloma resection     MULTIPLE EXTRACTIONS WITH ALVEOLOPLASTY Bilateral 01/23/2018   Procedure: DENTAL EXTRACTION OF TEETH NUMBER ONE, TWO,  THREE, FOUR, FIVE, SIX, SEVEN, EIGHT, NINE, TEN, ELEVEN, TWELVE, THIRTEEN, FOURTEEN, FIFTEEN, SIXTEEN, SEVENTEEN, TWENTY, TWENTY-ONE, TWENTY-TWO, TWENTY-THREE, TWENTY-FOUR, TWENTY-FIVE, TWENTY-SIX, TWENTY-SEVEN, TWENTY-EIGHT, TWENTY-NINE, THIRTY-TWO WITH ALVEOLOPLASTY;  Surgeon: Diona Browner, DDS;  Location: [facility];  Service: Oral Surgery;  Lat   RADIOLOGY WITH ANESTHESIA N/A 11/18/2015  Procedure: RADIOLOGY WITH ANESTHESIA;  Surgeon: Luanne Bras, MD;  Location: [facility];  Service: Radiology;  Laterality: N/A;   Removal foreign body right shoulder     Right rotator cuff repair     TEE WITHOUT CARDIOVERSION N/A 11/21/2015   Procedure: TRANSESOPHAGEAL ECHOCARDIOGRAM (TEE);  Surgeon: Larey Dresser, MD;  Location: [facility];  Service: Cardiovascular;  Laterality: N/A;   TOOTH EXTRACTION N/A 01/24/2018   Procedure: SUTURE ORAL WOUND;  Surgeon: Diona Browner, DDS;  Location: [facility];  Service: Oral Surgery;  Laterality: N/A;   Family History Family History  Problem Relation Age of Onset   Diabetes Mother    Hypertension Mother    Drug abuse Mother    Hyperlipidemia Mother    Diabetes Father    Hypertension Father    Hyperlipidemia Father    Diabetes Brother    Hypertension Brother     Social History Social History   Tobacco Use   Smoking status: Every Day    Packs/day: 0.50    Years: 30.00    Total pack years: 15.00    Types: Cigarettes    Last attempt to quit: 11/17/2015    Years since quitting: 6.5   Smokeless tobacco: Never  Vaping Use   Vaping Use: Never used  Substance Use Topics   Alcohol use: Not Currently    Comment: 2 40 oz daily. 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 Stopped liquor 5 years ago.   Drug use: Yes    Types: Marijuana    Comment: occassionally   Allergies Oxcarbazepine  Review of Systems Review of Systems  Gastrointestinal:  Positive for abdominal pain and nausea.    Physical Exam Vital Signs  I have reviewed the triage vital signs BP 131/89  (BP Location: Right Arm)   Pulse 94   Temp 99.7 F (37.6 C) (Oral)   Resp 18   SpO2 93%   Physical Exam Constitutional:      General: He is not in acute distress.    Appearance: Normal appearance.  HENT:     Head: Normocephalic and atraumatic.     Nose: No congestion or rhinorrhea.  Eyes:     General:        Right eye: No discharge.        Left eye: No discharge.     Extraocular Movements: Extraocular movements intact.     Pupils: Pupils are equal, round, and reactive to light.  Cardiovascular:     Rate and Rhythm: Normal rate and regular rhythm.     Heart sounds: No murmur heard. Pulmonary:     Effort: No respiratory distress.     Breath sounds: No wheezing or rales.  Abdominal:     General: There is no distension.     Tenderness: There is abdominal tenderness in the epigastric area.  Musculoskeletal:        General: Normal range of motion.     Cervical back: Normal range of motion.  Skin:    General: Skin is warm and dry.  Neurological:     General: No focal deficit present.     Mental Status: He is alert.     ED Results and Treatments Labs (all labs ordered are listed, but only abnormal results are displayed) Labs Reviewed  LIPASE, BLOOD - Abnormal; Notable for the following components:      Result Value   Lipase 330 (*)    All other components within normal limits  COMPREHENSIVE METABOLIC PANEL - Abnormal; Notable  for the following components:   Glucose, Bld 133 (*)    Creatinine, Ser 1.69 (*)    Calcium 8.6 (*)    Albumin 3.4 (*)    GFR, Estimated 48 (*)    All other components within normal limits  CBC - Abnormal; Notable for the following components:   RBC 3.83 (*)    Hemoglobin 11.8 (*)    HCT 36.9 (*)    All other components within normal limits  URINALYSIS, ROUTINE W REFLEX MICROSCOPIC                                                                                                                          Radiology No results  found.  Pertinent labs & imaging results that were available during my care of the patient were reviewed by me and considered in my medical decision making (see MDM for details).  Medications Ordered in ED Medications  lactated ringers bolus 1,000 mL (has no administration in time range)  morphine (PF) 4 MG/ML injection 4 mg (has no administration in time range)  ondansetron (ZOFRAN) injection 4 mg (has no administration in time range)                                                                                                                                     Procedures Procedures  (including critical care time)  Medical Decision Making / ED Course   This patient presents to the ED for concern of abdominal pain, this involves an extensive number of treatment options, and is a complaint that carries with it a high risk of complications and morbidity.  The differential diagnosis includes pancreatitis, pseudocyst formation, intra-abdominal infection, appendicitis, opioid addiction  MDM: Patient seen the emergency room for evaluation of abdominal pain.  Physical exam with tenderness in the epigastrium but is otherwise unremarkable.  Laboratory evaluation with a creatinine of 1.69 which is baseline for the patient, hemoglobin 11.8 which is also baseline and lipase is elevated to 330 which is elevated from previous evaluation 8 days ago.  Pain meds and fluid resuscitation begun and given patient's inability tolerate p.o. he will require admission for acute on chronic pancreatitis.   Additional history obtained:  -External records from outside source obtained and reviewed including: Chart review including previous notes, labs, imaging, consultation notes   Lab Tests: -I ordered, reviewed, and interpreted labs.   The pertinent  results include:   Labs Reviewed  LIPASE, BLOOD - Abnormal; Notable for the following components:      Result Value   Lipase 330 (*)    All other components  within normal limits  COMPREHENSIVE METABOLIC PANEL - Abnormal; Notable for the following components:   Glucose, Bld 133 (*)    Creatinine, Ser 1.69 (*)    Calcium 8.6 (*)    Albumin 3.4 (*)    GFR, Estimated 48 (*)    All other components within normal limits  CBC - Abnormal; Notable for the following components:   RBC 3.83 (*)    Hemoglobin 11.8 (*)    HCT 36.9 (*)    All other components within normal limits  URINALYSIS, ROUTINE W REFLEX MICROSCOPIC      Medicines ordered and prescription drug management: Meds ordered this encounter  Medications   lactated ringers bolus 1,000 mL   morphine (PF) 4 MG/ML injection 4 mg   ondansetron (ZOFRAN) injection 4 mg    -I have reviewed the patients home medicines and have made adjustments as needed  Critical interventions none    Cardiac Monitoring: The patient was maintained on a cardiac monitor.  I personally viewed and interpreted the cardiac monitored which showed an underlying rhythm of: NSR  Social Determinants of Health:  Factors impacting patients care include: none   Reevaluation: After the interventions noted above, I reevaluated the patient and found that they have :improved  Co morbidities that complicate the patient evaluation  Past Medical History:  Diagnosis Date   Arthritis    Chronic pain    Depression    Diabetes mellitus without complication ([place])    Essential hypertension, benign    GERD (gastroesophageal reflux disease)    Headache    HTN (hypertension) 11/27/2015   Hyperlipidemia    Sleep apnea    cpap    Stroke [facility])    2016      Dispostion: I considered admission for this patient, and given inability tolerate p.o. in the setting of acute on chronic back otitis patient require admission     Final Clinical Impression(s) / ED Diagnoses Final diagnoses:  None     @PCDICTATION @    Teressa Lower, MD 06/14/22 [phone]

## 2022-06-14 NOTE — H&P (Signed)
History and Physical    Patient: Taylor Obrien QBV:[id number] DOB: Feb 04, 1966 DOA: 06/14/2022 DOS: the patient was seen and examined on 06/14/2022 PCP: Darrol Jump, NP  Patient coming from: Home  Chief Complaint:  Chief Complaint  Patient presents with   Abdominal Pain   HPI: DESHAN HEMMELGARN is a 56 y.o. male with medical history significant of chronic pancreatitis, type 2 diabetes mellitus, alcohol abuse, hypertension, GERD who presents to the emergency due to abdominal pain.  Patient complaining of 2-week onset of intermittent upper abdominal pain which was rated as 6/10 on pain scale, pain was without any alleviating factors, but pain aggravates with movement.  Patient states that he has not drank alcohol since last time he was discharged from the hospital.  Patient complained of decreased oral intake due to nausea and vomiting.  He states that he vomited about 2 days ago.  He denies chest pain, shortness of breath, fever, chills, headache.  ED Course:  In the emergency department, he was hemodynamically stable.  Work-up in the ED showed normocytic anemia, BMP was normal except BUN/creatinine 15/1.69 (baseline creatinine at 1.6-1.9).  Lipase 330, alcohol less than 10. CT abdomen and pelvis showed unchanged fluid collections in the pancreatic head and body compared to 04/24/2022. 2. Mild residual peripancreatic inflammatory change Patient was treated with IV morphine 4 mg x 1 and Zofran.  IV hydration was provided.  Hospitalist was asked to admit patient for further evaluation and management  Review of Systems: Review of systems as noted in the HPI. All other systems reviewed and are negative.   Past Medical History:  Diagnosis Date   Arthritis    Chronic pain    Depression    Diabetes mellitus without complication (HCC)    Essential hypertension, benign    GERD (gastroesophageal reflux disease)    Headache    HTN (hypertension) 11/27/2015   Hyperlipidemia    Sleep apnea    cpap     Stroke ([place])    2016   Past Surgical History:  Procedure Laterality Date   CLOSED REDUCTION MANDIBULAR FRACTURE W/ ARCH BARS     + multiple extractions   Condyloma resection     MULTIPLE EXTRACTIONS WITH ALVEOLOPLASTY Bilateral 01/23/2018   Procedure: DENTAL EXTRACTION OF TEETH NUMBER ONE, TWO, THREE, FOUR, FIVE, SIX, SEVEN, EIGHT, NINE, TEN, ELEVEN, TWELVE, THIRTEEN, FOURTEEN, FIFTEEN, SIXTEEN, SEVENTEEN, TWENTY, TWENTY-ONE, TWENTY-TWO, TWENTY-THREE, TWENTY-FOUR, TWENTY-FIVE, TWENTY-SIX, TWENTY-SEVEN, TWENTY-EIGHT, TWENTY-NINE, THIRTY-TWO WITH ALVEOLOPLASTY;  Surgeon: Diona Browner, DDS;  Location: [place];  Service: Oral Surgery;  Lat   RADIOLOGY WITH ANESTHESIA N/A 11/18/2015   Procedure: RADIOLOGY WITH ANESTHESIA;  Surgeon: Luanne Bras, MD;  Location: [place];  Service: Radiology;  Laterality: N/A;   Removal foreign body right shoulder     Right rotator cuff repair     TEE WITHOUT CARDIOVERSION N/A 11/21/2015   Procedure: TRANSESOPHAGEAL ECHOCARDIOGRAM (TEE);  Surgeon: Larey Dresser, MD;  Location: [place];  Service: Cardiovascular;  Laterality: N/A;   TOOTH EXTRACTION N/A 01/24/2018   Procedure: SUTURE ORAL WOUND;  Surgeon: Diona Browner, DDS;  Location: [place];  Service: Oral Surgery;  Laterality: N/A;    Social History:  reports that he has been smoking cigarettes. He has a 15.00 pack-year smoking history. He has never used smokeless tobacco. He reports that he does not currently use alcohol. He reports current drug use. Drug: Marijuana.   Allergies  Allergen Reactions   Oxcarbazepine Other (See Comments)    Patient goes out of right  state of mind.     Family History  Problem Relation Age of Onset   Diabetes Mother    Hypertension Mother    Drug abuse Mother    Hyperlipidemia Mother    Diabetes Father    Hypertension Father    Hyperlipidemia Father    Diabetes Brother    Hypertension Brother      Prior to Admission medications   Medication Sig Start Date  End Date Taking? Authorizing Provider  amLODipine (NORVASC) 10 MG tablet Take 1 tablet (10 mg total) by mouth daily. 01/19/16   Kirsteins, [name], MD  aspirin 325 MG tablet Take 1 tablet (325 mg total) by mouth daily. 12/20/15   Love, Ivan Anchors, PA-C  blood glucose meter kit and supplies KIT Dispense based on patient and insurance preference. Use up to four times daily as directed. (FOR ICD-9 250.00, 250.01). 03/18/17   Forde Dandy, MD  cloNIDine (CATAPRES - DOSED IN MG/24 HR) 0.3 mg/24hr patch Place 1 patch (0.3 mg total) onto the skin once a week. Next change on monday Patient taking differently: Place 0.3 mg onto the skin every Wednesday. Next change on monday 12/20/15   Love, Ivan Anchors, PA-C  diphenhydramine-acetaminophen (TYLENOL PM) 25-500 MG TABS tablet Take 1-2 tablets by mouth at bedtime as needed (for sleep). Depends on insomnia if takes 1-2 tablets    [provider]  empagliflozin (JARDIANCE) 25 MG TABS tablet Take 25 mg by mouth daily.     [provider]  escitalopram (LEXAPRO) 10 MG tablet Take 10 mg by mouth daily.    [provider]  fenofibrate (TRICOR) 145 MG tablet Take 145 mg by mouth daily. 04/10/22   [provider]  gabapentin (NEURONTIN) 300 MG capsule Take 300 mg by mouth 2 (two) times daily. 05/09/22   [provider]  glucose blood test strip Use as instructed 03/18/17   Forde Dandy, MD  hydrALAZINE (APRESOLINE) 50 MG tablet Take 1 tablet (50 mg total) by mouth 2 (two) times daily. Patient not taking: Reported on 06/05/2022 04/30/22   [name], MD  HYDROcodone-acetaminophen (NORCO/VICODIN) 5-325 MG tablet Take 1-2 tablets by mouth every 6 (six) hours as needed. 06/06/22   [name], MD  hydrOXYzine (ATARAX) 25 MG tablet Take 25 mg by mouth every 8 (eight) hours as needed for anxiety. 04/17/22   [provider]  labetalol (NORMODYNE) 300 MG tablet Take 300 mg by mouth 2 (two) times daily. 03/08/22   [provider]   metFORMIN (GLUCOPHAGE) 500 MG tablet Take 1 tablet (500 mg total) by mouth 2 (two) times daily with a meal. 03/18/17   Forde Dandy, MD  nicotine (NICODERM CQ - DOSED IN MG/24 HOURS) 14 mg/24hr patch Place 14 mg onto the skin daily. 05/10/22   [provider]  pantoprazole (PROTONIX) 40 MG tablet Take 1 tablet (40 mg total) by mouth at bedtime. 01/19/16   Kirsteins, [name], MD  traZODone (DESYREL) 100 MG tablet Take 1 tablet (100 mg total) by mouth at bedtime. 01/19/16   Kirsteins, [name], MD  triamcinolone cream (KENALOG) 0.1 % Apply 1 application. topically daily.    [provider]  VASCEPA 1 g capsule Take 2 g by mouth 2 (two) times daily. 03/12/22   [provider]    Physical Exam: BP (!) 145/93   Pulse 83   Temp 98.4 F (36.9 C) (Oral)   Resp 18   Ht _0  (1.676 m)  Wt 83.3 kg   SpO2 92%   BMI 29.64 kg/m   General: 56 y.o. year-old male well developed well nourished in no acute distress.  Alert and oriented x3. HEENT: NCAT, EOMI Neck: Supple, trachea medial Cardiovascular: Regular rate and rhythm with no rubs or gallops.  No thyromegaly or JVD noted.  No lower extremity edema. 2/4 pulses in all 4 extremities. Respiratory: Clear to auscultation with no wheezes or rales. Good inspiratory effort. Abdomen: Soft, tender to palpation of epigastric area without guarding.  Normal bowel sounds x4 quadrants. Muskuloskeletal: No cyanosis, clubbing or edema noted bilaterally Neuro: CN II-XII intact, strength 5/5 x 4, sensation, reflexes intact Skin: No ulcerative lesions noted or rashes Psychiatry: Judgement and insight appear normal. Mood is appropriate for condition and setting          Labs on Admission:  Basic Metabolic Panel: Recent Labs  Lab 06/14/22 1518  NA 135  K 3.9  CL 104  CO2 24  GLUCOSE 133*  BUN 15  CREATININE 1.69*  CALCIUM 8.6*   Liver Function Tests: Recent Labs  Lab 06/14/22 1518  AST 23  ALT 25  ALKPHOS 39  BILITOT 0.7   PROT 6.9  ALBUMIN 3.4*   Recent Labs  Lab 06/14/22 1518  LIPASE 330*   No results for input(s): "AMMONIA" in the last 168 hours. CBC: Recent Labs  Lab 06/14/22 1518  WBC 9.6  HGB 11.8*  HCT 36.9*  MCV 96.3  PLT 320   Cardiac Enzymes: No results for input(s): "CKTOTAL", "CKMB", "CKMBINDEX", "TROPONINI" in the last 168 hours.  BNP (last 3 results) No results for input(s): "BNP" in the last 8760 hours.  ProBNP (last 3 results) No results for input(s): "PROBNP" in the last 8760 hours.  CBG: No results for input(s): "GLUCAP" in the last 168 hours.  Radiological Exams on Admission: No results found.  EKG: I independently viewed the EKG done and my findings are as followed: EKG was not done in the ED  Assessment/Plan Present on Admission:  Acute pancreatitis without infection or necrosis  Mixed hyperlipidemia  Stage 3a chronic kidney disease (CKD) ([place])  Essential hypertension  Stroke (cerebrum) ([place])  Tobacco abuse  ETOH abuse  Active Problems:   Mixed hyperlipidemia   ETOH abuse   Diabetes mellitus ([place])   Tobacco abuse   Stroke (cerebrum) ([place])   Essential hypertension   Acute pancreatitis without infection or necrosis   Stage 3a chronic kidney disease (CKD) (HCC)   Abdominal pain   Nausea and vomiting   Elevated lipase   Hypoalbuminemia due to protein-calorie malnutrition (HCC)  Acute pancreatitis Elevated lipase Abdominal pain, nausea and vomiting secondary to pancreatitis Lipase 330 BISAP Score = 0 points (<1 % risk of mortality) Continue IV Zofran p.r.n Continue IV morphine p.r.n for pain Continue Protonix Continue IV LR at 110m/Hr Continue clear liquid diet with plan to advance diet as tolerated RUQ U/S in the morning to investigate biliary etiology (gallstone and bile duct dilatation  Hypoalbuminemia secondary to mild protein calorie malnutrition Albumin 3.4, protein supplement to be provided  Type 2 diabetes mellitus Hemoglobin A1c  about 1 month ago was 7.7 Continue ISS and hypoglycemia protocol Metformin and Jardiance will be held at this time  Chronic kidney disease stage 3A BUN/creatinine 15/1.69 (baseline creatinine at 1.6-1.9) Renally adjust medications, avoid nephrotoxic agents/dehydration/hypotension  Essential hypertension Amlodipine, labetalol  GERD Continue Protonix  Hyperlipidemia Continue fenofibrate, Vascepa  History of stroke Patient with residual left-lower extremity weakness Continue fall precaution  neurochecks  Alcohol abuse Alcohol level less than 10 Patient denies any recent alcohol use Monitor for alcohol withdrawal and consider starting patient on alcohol withdrawal protocol  Tobacco abuse Patient was counseled on tobacco abuse cessation   DVT prophylaxis: Lovenox  Code Status: Full code  Consults: None  Family Communication: None at bedside  Severity of Illness: The appropriate patient status for this patient is INPATIENT. Inpatient status is judged to be reasonable and necessary in order to provide the required intensity of service to ensure the patient's safety. The patient's presenting symptoms, physical exam findings, and initial radiographic and laboratory data in the context of their chronic comorbidities is felt to place them at high risk for further clinical deterioration. Furthermore, it is not anticipated that the patient will be medically stable for discharge from the hospital within 2 midnights of admission.   * I certify that at the point of admission it is my clinical judgment that the patient will require inpatient hospital care spanning beyond 2 midnights from the point of admission due to high intensity of service, high risk for further deterioration and high frequency of surveillance required.*  Author: Bernadette Hoit, DO 06/14/2022 7:16 PM  For on call review [email or web address].

## 2022-06-15 ENCOUNTER — Inpatient Hospital Stay (HOSPITAL_COMMUNITY): Payer: Medicare Other

## 2022-06-15 DIAGNOSIS — K859 Acute pancreatitis without necrosis or infection, unspecified: Secondary | ICD-10-CM | POA: Diagnosis not present

## 2022-06-15 DIAGNOSIS — I10 Essential (primary) hypertension: Secondary | ICD-10-CM | POA: Diagnosis not present

## 2022-06-15 DIAGNOSIS — E1122 Type 2 diabetes mellitus with diabetic chronic kidney disease: Secondary | ICD-10-CM

## 2022-06-15 DIAGNOSIS — R1010 Upper abdominal pain, unspecified: Secondary | ICD-10-CM | POA: Diagnosis not present

## 2022-06-15 LAB — COMPREHENSIVE METABOLIC PANEL
ALT: 22 U/L (ref 0–44)
AST: 21 U/L (ref 15–41)
Albumin: 2.9 g/dL — ABNORMAL LOW (ref 3.5–5.0)
Alkaline Phosphatase: 35 U/L — ABNORMAL LOW (ref 38–126)
Anion gap: 6 (ref 5–15)
BUN: 14 mg/dL (ref 6–20)
CO2: 27 mmol/L (ref 22–32)
Calcium: 8.6 mg/dL — ABNORMAL LOW (ref 8.9–10.3)
Chloride: 107 mmol/L (ref 98–111)
Creatinine, Ser: 1.53 mg/dL — ABNORMAL HIGH (ref 0.61–1.24)
GFR, Estimated: 54 mL/min — ABNORMAL LOW (ref >60)
Glucose, Bld: 93 mg/dL (ref 70–99)
Potassium: 3.9 mmol/L (ref 3.5–5.1)
Sodium: 140 mmol/L (ref 135–145)
Total Bilirubin: 0.8 mg/dL (ref 0.3–1.2)
Total Protein: 5.9 g/dL — ABNORMAL LOW (ref 6.5–8.1)

## 2022-06-15 LAB — CBC
HCT: 33.4 % — ABNORMAL LOW (ref 39.0–52.0)
Hemoglobin: 10.8 g/dL — ABNORMAL LOW (ref 13.0–17.0)
MCH: 31.3 pg (ref 26.0–34.0)
MCHC: 32.3 g/dL (ref 30.0–36.0)
MCV: 96.8 fL (ref 80.0–100.0)
Platelets: 304 10*3/uL (ref 150–400)
RBC: 3.45 MIL/uL — ABNORMAL LOW (ref 4.22–5.81)
RDW: 15.5 % (ref 11.5–15.5)
WBC: 7.4 10*3/uL (ref 4.0–10.5)
nRBC: 0 % (ref 0.0–0.2)

## 2022-06-15 LAB — APTT: aPTT: 49 seconds — ABNORMAL HIGH (ref 24–36)

## 2022-06-15 LAB — GLUCOSE, CAPILLARY
Glucose-Capillary: 97 mg/dL (ref 70–99)
Glucose-Capillary: 111 mg/dL — ABNORMAL HIGH (ref 70–99)
Glucose-Capillary: 140 mg/dL — ABNORMAL HIGH (ref 70–99)
Glucose-Capillary: 107 mg/dL — ABNORMAL HIGH (ref 70–99)

## 2022-06-15 MED ORDER — LACTATED RINGERS IV SOLN
INTRAVENOUS | Status: DC
Start: 2022-06-15 — End: 2022-06-16

## 2022-06-15 MED ORDER — OMEGA-3-ACID ETHYL ESTERS 1 G PO CAPS
2.0000 g | ORAL_CAPSULE | Freq: Two times a day (BID) | ORAL | Status: DC
  Administered 2022-06-15 – 2022-06-17 (×5): 2 g via ORAL
  Filled 2022-06-15 (×5): qty 2

## 2022-06-15 MED ORDER — OXYCODONE HCL 5 MG PO TABS
10.0000 mg | ORAL_TABLET | ORAL | Status: DC | PRN
  Administered 2022-06-16 – 2022-06-17 (×5): 10 mg via ORAL
  Filled 2022-06-15 (×6): qty 2

## 2022-06-15 NOTE — Progress Notes (Signed)
PROGRESS NOTE    Taylor Obrien  WUJ:[id number] DOB: [birth year] DOA: 06/14/2022 PCP: Darrol Jump, NP    Brief Narrative:  56 year old male, admitted with abdominal pain related to acute pancreatitis.  He has been dealing with pancreatitis/abdominal pain for the last 1 to 2 months.   Assessment & Plan:   Active Problems:   Mixed hyperlipidemia   ETOH abuse   Diabetes mellitus (HCC)   Tobacco abuse   Stroke (cerebrum) (HCC)   Essential hypertension   Acute pancreatitis without infection or necrosis   Stage 3a chronic kidney disease (CKD) (HCC)   Abdominal pain   Nausea and vomiting   Elevated lipase   Hypoalbuminemia due to protein-calorie malnutrition ([place])   Acute pancreatitis May also have component of chronic pancreatitis -Patient has been dealing with pancreatitis for at least a month -CT abdomen from 04/24/2022 shows ill-defined fluid collections in the pancreatic head and peripancreatic area.  These are unchanged on CT scan repeated on this admission -Continues to have significant abdominal pain requiring IV pain medicine -We will continue bowel rest keep the patient n.p.o. for now -Continue IV hydration and pain management -If he does not improve, may need GI input -No biliary etiology identified -His wife said that has not had any alcohol in approximately 4 months -Check triglycerides  CKD stage IIIa -Baseline creatinine 1.6-1.9 -Currently, creatinine is at baseline  Hypertension -Continued on amlodipine and labetalol  GERD -Continue Protonix  Diabetes, type II -Chronically on metformin and Jardiance which are currently on hold -Continue on sliding scale insulin   DVT prophylaxis: enoxaparin (LOVENOX) injection 40 mg Start: 06/14/22 2200 SCDs Start: 06/14/22 1843  Code Status: Full code Family Communication: Discussed with patient's wife Disposition Plan: Status is: Inpatient Remains inpatient appropriate because: Continued management of abdominal  pain and bowel rest     Consultants:    Procedures:    Antimicrobials:      Subjective: Reports continued abdominal pain.  Says that pain is worse today than it was yesterday.  No vomiting.  Objective: Vitals:   06/15/22 0143 06/15/22 0442 06/15/22 0837 06/15/22 1317  BP: 133/84 (!) 148/84 (!) 151/93 136/90  Pulse: 80 79 77 90  Resp: '18 18  16  '$ Temp: 98.1 F (36.7 C) 98.4 F (36.9 C)  98.5 F (36.9 C)  TempSrc: Oral Oral  Oral  SpO2: 98% 99%  92%  Weight:      Height:        Intake/Output Summary (Last 24 hours) at 06/15/2022 1951 Last data filed at 06/15/2022 1700 Gross per 24 hour  Intake 2461 ml  Output 1600 ml  Net 861 ml   Filed Weights   06/14/22 1838  Weight: 83.3 kg    Examination:  General exam: Appears calm and comfortable  Respiratory system: Clear to auscultation. Respiratory effort normal. Cardiovascular system: S1 & S2 heard, RRR. No JVD, murmurs, rubs, gallops or clicks. No pedal edema. Gastrointestinal system: Abdomen is nondistended, soft and tender and right upper and right lower quadrant. No organomegaly or masses felt. Normal bowel sounds heard. Central nervous system: Alert and oriented. No focal neurological deficits. Extremities: Symmetric 5 x 5 power. Skin: No rashes, lesions or ulcers Psychiatry: Judgement and insight appear normal. Mood & affect appropriate.     Data Reviewed: I have personally reviewed following labs and imaging studies  CBC: Recent Labs  Lab 06/14/22 1518 06/15/22 0432  WBC 9.6 7.4  HGB 11.8* 10.8*  HCT 36.9* 33.4*  MCV 96.3 96.8  PLT 320 867   Basic Metabolic Panel: Recent Labs  Lab 06/14/22 1518 06/15/22 0432  NA 135 140  K 3.9 3.9  CL 104 107  CO2 24 27  GLUCOSE 133* 93  BUN 15 14  CREATININE 1.69* 1.53*  CALCIUM 8.6* 8.6*   GFR: Estimated Creatinine Clearance: 56.5 mL/min (A) (by C-G formula based on SCr of 1.53 mg/dL (H)). Liver Function Tests: Recent Labs  Lab 06/14/22 1518  06/15/22 0432  AST 23 21  ALT 25 22  ALKPHOS 39 35*  BILITOT 0.7 0.8  PROT 6.9 5.9*  ALBUMIN 3.4* 2.9*   Recent Labs  Lab 06/14/22 1518  LIPASE 330*   No results for input(s): "AMMONIA" in the last 168 hours. Coagulation Profile: No results for input(s): "INR", "PROTIME" in the last 168 hours. Cardiac Enzymes: No results for input(s): "CKTOTAL", "CKMB", "CKMBINDEX", "TROPONINI" in the last 168 hours. BNP (last 3 results) No results for input(s): "PROBNP" in the last 8760 hours. HbA1C: No results for input(s): "HGBA1C" in the last 72 hours. CBG: Recent Labs  Lab 06/14/22 2024 06/15/22 0607 06/15/22 1111 06/15/22 1618  GLUCAP 94 97 111* 140*   Lipid Profile: No results for input(s): "CHOL", "HDL", "LDLCALC", "TRIG", "CHOLHDL", "LDLDIRECT" in the last 72 hours. Thyroid Function Tests: No results for input(s): "TSH", "T4TOTAL", "FREET4", "T3FREE", "THYROIDAB" in the last 72 hours. Anemia Panel: No results for input(s): "VITAMINB12", "FOLATE", "FERRITIN", "TIBC", "IRON", "RETICCTPCT" in the last 72 hours. Sepsis Labs: No results for input(s): "PROCALCITON", "LATICACIDVEN" in the last 168 hours.  No results found for this or any previous visit (from the past 240 hour(s)).       Radiology Studies: US Abdomen Limited  Result Date: 06/15/2022 CLINICAL DATA:  Abdominal pain. EXAM: ULTRASOUND ABDOMEN LIMITED RIGHT UPPER QUADRANT COMPARISON:  04/26/2022 FINDINGS: Gallbladder: No gallstones or gallbladder wall thickening. Sludge is visible in the lumen. No pericholecystic fluid. The sonographer reports no sonographic Murphy's sign. Common bile duct: Diameter: 4 mm Liver: Coarsening of the hepatic echotexture suggests steatosis. Portal vein is patent on color Doppler imaging with normal direction of blood flow towards the liver. Other: None. IMPRESSION: No evidence for gallstones or gallbladder wall thickening. Coarsening of the hepatic echotexture suggests steatosis.  Electronically Signed   By: Misty Stanley M.D.   On: 06/15/2022 14:23   CT ABDOMEN PELVIS W CONTRAST  Result Date: 06/14/2022 CLINICAL DATA:  Epigastric pain EXAM: CT ABDOMEN AND PELVIS WITH CONTRAST TECHNIQUE: Multidetector CT imaging of the abdomen and pelvis was performed using the standard protocol following bolus administration of intravenous contrast. RADIATION DOSE REDUCTION: This exam was performed according to the departmental dose-optimization program which includes automated exposure control, adjustment of the mA and/or kV according to patient size and/or use of iterative reconstruction technique. CONTRAST:  174m OMNIPAQUE IOHEXOL 300 MG/ML  SOLN COMPARISON:  06/04/2022, 04/24/2022 FINDINGS: Lower Chest: Normal. Hepatobiliary: Normal hepatic contours. No intra- or extrahepatic biliary dilatation. The gallbladder is normal. Pancreas: There is an intrapancreatic fluid collection at the pancreatic head that measures 3.3 x 1.8 cm. There is a smaller collection in the pancreatic body measuring 1.3 cm. There is mild residual peripancreatic edema. Spleen: Normal. Adrenals/Urinary Tract: The adrenal glands are normal. No hydronephrosis, nephroureterolithiasis or solid renal mass. The urinary bladder is normal for degree of distention Stomach/Bowel: There is no hiatal hernia. Normal duodenal course and caliber. No small bowel dilatation or inflammation. No focal colonic abnormality. Normal appendix. Vascular/Lymphatic: There is calcific atherosclerosis of the abdominal aorta. No lymphadenopathy.  Reproductive: Normal prostate size with symmetric seminal vesicles. Other: None. Musculoskeletal: No bony spinal canal stenosis or focal osseous abnormality. IMPRESSION: 1. Unchanged fluid collections in the pancreatic head and body compared to 04/24/2022. 2. Mild residual peripancreatic inflammatory change. 3.  Aortic atherosclerosis (ICD10-I70.0). Electronically Signed   By: Ulyses Jarred M.D.   On: 06/14/2022 20:18         Scheduled Meds:  amLODipine  10 mg Oral Daily   enoxaparin (LOVENOX) injection  40 mg Subcutaneous Q24H   feeding supplement (GLUCERNA SHAKE)  237 mL Oral TID BM   fenofibrate  160 mg Oral Daily   insulin aspart  0-15 Units Subcutaneous TID WC   labetalol  300 mg Oral BID   omega-3 acid ethyl esters  2 g Oral BID   pantoprazole (PROTONIX) IV  40 mg Intravenous Q24H   Continuous Infusions:  lactated ringers 125 mL/hr at 06/15/22 1531     LOS: 1 day    Time spent: 25mns    JKathie Dike MD [name]   If 7PM-7AM, please contact night-coverage [email or web address]  06/15/2022, 7:51 PM

## 2022-06-16 ENCOUNTER — Encounter (HOSPITAL_COMMUNITY): Payer: Self-pay | Admitting: Internal Medicine

## 2022-06-16 DIAGNOSIS — I10 Essential (primary) hypertension: Secondary | ICD-10-CM | POA: Diagnosis not present

## 2022-06-16 DIAGNOSIS — E8809 Other disorders of plasma-protein metabolism, not elsewhere classified: Secondary | ICD-10-CM | POA: Diagnosis not present

## 2022-06-16 DIAGNOSIS — K859 Acute pancreatitis without necrosis or infection, unspecified: Secondary | ICD-10-CM | POA: Diagnosis not present

## 2022-06-16 DIAGNOSIS — E1122 Type 2 diabetes mellitus with diabetic chronic kidney disease: Secondary | ICD-10-CM | POA: Diagnosis not present

## 2022-06-16 LAB — BASIC METABOLIC PANEL
Anion gap: 6 (ref 5–15)
BUN: 13 mg/dL (ref 6–20)
CO2: 28 mmol/L (ref 22–32)
Calcium: 8.6 mg/dL — ABNORMAL LOW (ref 8.9–10.3)
Chloride: 105 mmol/L (ref 98–111)
Creatinine, Ser: 1.5 mg/dL — ABNORMAL HIGH (ref 0.61–1.24)
GFR, Estimated: 55 mL/min — ABNORMAL LOW (ref >60)
Glucose, Bld: 120 mg/dL — ABNORMAL HIGH (ref 70–99)
Potassium: 4.1 mmol/L (ref 3.5–5.1)
Sodium: 139 mmol/L (ref 135–145)

## 2022-06-16 LAB — GLUCOSE, CAPILLARY
Glucose-Capillary: 125 mg/dL — ABNORMAL HIGH (ref 70–99)
Glucose-Capillary: 120 mg/dL — ABNORMAL HIGH (ref 70–99)
Glucose-Capillary: 153 mg/dL — ABNORMAL HIGH (ref 70–99)
Glucose-Capillary: 106 mg/dL — ABNORMAL HIGH (ref 70–99)

## 2022-06-16 LAB — LIPID PANEL
Cholesterol: 156 mg/dL (ref 0–200)
HDL: 13 mg/dL — ABNORMAL LOW (ref >40)
LDL Cholesterol: 99 mg/dL (ref 0–99)
Total CHOL/HDL Ratio: 12 RATIO
Triglycerides: 221 mg/dL — ABNORMAL HIGH (ref <150)
VLDL: 44 mg/dL — ABNORMAL HIGH (ref 0–40)

## 2022-06-16 NOTE — Progress Notes (Signed)
PROGRESS NOTE    Taylor Obrien  CXK:[id number] DOB: 05/05/1966 DOA: 06/14/2022 PCP: Darrol Jump, NP    Brief Narrative:  56 year old male, admitted with abdominal pain related to acute pancreatitis.  He has been dealing with pancreatitis/abdominal pain for the last 1 to 2 months.   Assessment & Plan:   Active Problems:   Mixed hyperlipidemia   ETOH abuse   Diabetes mellitus (HCC)   Tobacco abuse   Stroke (cerebrum) (HCC)   Essential hypertension   Acute pancreatitis without infection or necrosis   Stage 3a chronic kidney disease (CKD) (HCC)   Abdominal pain   Nausea and vomiting   Elevated lipase   Hypoalbuminemia due to protein-calorie malnutrition ([place])   Acute pancreatitis May also have component of chronic pancreatitis -Patient has been dealing with pancreatitis for at least a month -CT abdomen from 04/24/2022 shows ill-defined fluid collections in the pancreatic head and peripancreatic area.  These are unchanged on CT scan repeated on this admission -Continues to have significant abdominal pain requiring IV pain medicine -Initially treated with bowel rest, IV fluids and pain management -No biliary etiology identified -His wife said that has not had any alcohol in approximately 4 months -Triglycerides 221 -Since he is ready to try p.o. intake, will start with clear liquids -Patient lost IV, does not wish to have this replaced.  We will try p.o. pain medications.  CKD stage IIIa -Baseline creatinine 1.6-1.9 -Currently, creatinine is at baseline  Hypertension -Continued on amlodipine and labetalol  GERD -Continue Protonix  Diabetes, type II -Chronically on metformin and Jardiance which are currently on hold -Continue on sliding scale insulin   DVT prophylaxis: enoxaparin (LOVENOX) injection 40 mg Start: 06/14/22 2200 SCDs Start: 06/14/22 1843  Code Status: Full code Family Communication: Discussed with patient's wife Disposition Plan: Status is:  Inpatient Remains inpatient appropriate because: Continued management of abdominal pain and bowel rest     Consultants:    Procedures:    Antimicrobials:      Subjective: He says he is feeling better today.  Last dose of pain medication was approximately 12 hours ago.  Says he is ready to start p.o. intake.  Objective: Vitals:   06/15/22 2058 06/16/22 0604 06/16/22 0836 06/16/22 1436  BP: (!) 142/91 (!) 160/101 (!) 165/100 (!) 166/102  Pulse: 87 78 68 84  Resp: '20 16  18  '$ Temp: 100.1 F (37.8 C) (!) 97.5 F (36.4 C)  98.5 F (36.9 C)  TempSrc: Oral Oral  Oral  SpO2: 99% 100% 100% 99%  Weight:      Height:        Intake/Output Summary (Last 24 hours) at 06/16/2022 2001 Last data filed at 06/16/2022 1700 Gross per 24 hour  Intake 2599.27 ml  Output 800 ml  Net 1799.27 ml   Filed Weights   06/14/22 1838  Weight: 83.3 kg    Examination:  General exam: Appears calm and comfortable  Respiratory system: Clear to auscultation. Respiratory effort normal. Cardiovascular system: S1 & S2 heard, RRR. No JVD, murmurs, rubs, gallops or clicks. No pedal edema. Gastrointestinal system: Abdomen is nondistended, soft and tender and right upper and right lower quadrant. No organomegaly or masses felt. Normal bowel sounds heard. Central nervous system: Alert and oriented. No focal neurological deficits. Extremities: Symmetric 5 x 5 power. Skin: No rashes, lesions or ulcers Psychiatry: Judgement and insight appear normal. Mood & affect appropriate.     Data Reviewed: I have personally reviewed following labs and imaging studies  CBC: Recent Labs  Lab 06/14/22 1518 06/15/22 0432  WBC 9.6 7.4  HGB 11.8* 10.8*  HCT 36.9* 33.4*  MCV 96.3 96.8  PLT 320 619   Basic Metabolic Panel: Recent Labs  Lab 06/14/22 1518 06/15/22 0432 06/16/22 0456  NA 135 140 139  K 3.9 3.9 4.1  CL 104 107 105  CO2 '24 27 28  '$ GLUCOSE 133* 93 120*  BUN '15 14 13  '$ CREATININE 1.69* 1.53* 1.50*   CALCIUM 8.6* 8.6* 8.6*   GFR: Estimated Creatinine Clearance: 57.7 mL/min (A) (by C-G formula based on SCr of 1.5 mg/dL (H)). Liver Function Tests: Recent Labs  Lab 06/14/22 1518 06/15/22 0432  AST 23 21  ALT 25 22  ALKPHOS 39 35*  BILITOT 0.7 0.8  PROT 6.9 5.9*  ALBUMIN 3.4* 2.9*   Recent Labs  Lab 06/14/22 1518  LIPASE 330*   No results for input(s): "AMMONIA" in the last 168 hours. Coagulation Profile: No results for input(s): "INR", "PROTIME" in the last 168 hours. Cardiac Enzymes: No results for input(s): "CKTOTAL", "CKMB", "CKMBINDEX", "TROPONINI" in the last 168 hours. BNP (last 3 results) No results for input(s): "PROBNP" in the last 8760 hours. HbA1C: No results for input(s): "HGBA1C" in the last 72 hours. CBG: Recent Labs  Lab 06/15/22 1618 06/15/22 2054 06/16/22 0734 06/16/22 1129 06/16/22 1647  GLUCAP 140* 107* 125* 120* 153*   Lipid Profile: Recent Labs    06/16/22 0456  CHOL 156  HDL 13*  LDLCALC 99  TRIG 221*  CHOLHDL 12.0   Thyroid Function Tests: No results for input(s): "TSH", "T4TOTAL", "FREET4", "T3FREE", "THYROIDAB" in the last 72 hours. Anemia Panel: No results for input(s): "VITAMINB12", "FOLATE", "FERRITIN", "TIBC", "IRON", "RETICCTPCT" in the last 72 hours. Sepsis Labs: No results for input(s): "PROCALCITON", "LATICACIDVEN" in the last 168 hours.  No results found for this or any previous visit (from the past 240 hour(s)).       Radiology Studies: US Abdomen Limited  Result Date: 06/15/2022 CLINICAL DATA:  Abdominal pain. EXAM: ULTRASOUND ABDOMEN LIMITED RIGHT UPPER QUADRANT COMPARISON:  04/26/2022 FINDINGS: Gallbladder: No gallstones or gallbladder wall thickening. Sludge is visible in the lumen. No pericholecystic fluid. The sonographer reports no sonographic Murphy's sign. Common bile duct: Diameter: 4 mm Liver: Coarsening of the hepatic echotexture suggests steatosis. Portal vein is patent on color Doppler imaging with  normal direction of blood flow towards the liver. Other: None. IMPRESSION: No evidence for gallstones or gallbladder wall thickening. Coarsening of the hepatic echotexture suggests steatosis. Electronically Signed   By: Misty Stanley M.D.   On: 06/15/2022 14:23   CT ABDOMEN PELVIS W CONTRAST  Result Date: 06/14/2022 CLINICAL DATA:  Epigastric pain EXAM: CT ABDOMEN AND PELVIS WITH CONTRAST TECHNIQUE: Multidetector CT imaging of the abdomen and pelvis was performed using the standard protocol following bolus administration of intravenous contrast. RADIATION DOSE REDUCTION: This exam was performed according to the departmental dose-optimization program which includes automated exposure control, adjustment of the mA and/or kV according to patient size and/or use of iterative reconstruction technique. CONTRAST:  115m OMNIPAQUE IOHEXOL 300 MG/ML  SOLN COMPARISON:  06/04/2022, 04/24/2022 FINDINGS: Lower Chest: Normal. Hepatobiliary: Normal hepatic contours. No intra- or extrahepatic biliary dilatation. The gallbladder is normal. Pancreas: There is an intrapancreatic fluid collection at the pancreatic head that measures 3.3 x 1.8 cm. There is a smaller collection in the pancreatic body measuring 1.3 cm. There is mild residual peripancreatic edema. Spleen: Normal. Adrenals/Urinary Tract: The adrenal glands are normal. No hydronephrosis,  nephroureterolithiasis or solid renal mass. The urinary bladder is normal for degree of distention Stomach/Bowel: There is no hiatal hernia. Normal duodenal course and caliber. No small bowel dilatation or inflammation. No focal colonic abnormality. Normal appendix. Vascular/Lymphatic: There is calcific atherosclerosis of the abdominal aorta. No lymphadenopathy. Reproductive: Normal prostate size with symmetric seminal vesicles. Other: None. Musculoskeletal: No bony spinal canal stenosis or focal osseous abnormality. IMPRESSION: 1. Unchanged fluid collections in the pancreatic head and  body compared to 04/24/2022. 2. Mild residual peripancreatic inflammatory change. 3.  Aortic atherosclerosis (ICD10-I70.0). Electronically Signed   By: Ulyses Jarred M.D.   On: 06/14/2022 20:18        Scheduled Meds:  amLODipine  10 mg Oral Daily   enoxaparin (LOVENOX) injection  40 mg Subcutaneous Q24H   feeding supplement (GLUCERNA SHAKE)  237 mL Oral TID BM   fenofibrate  160 mg Oral Daily   insulin aspart  0-15 Units Subcutaneous TID WC   labetalol  300 mg Oral BID   omega-3 acid ethyl esters  2 g Oral BID   pantoprazole (PROTONIX) IV  40 mg Intravenous Q24H   Continuous Infusions:     LOS: 2 days    Time spent: 62mns    JKathie Dike MD [name]   If 7PM-7AM, please contact night-coverage [email or web address]  06/16/2022, 8:01 PM

## 2022-06-17 DIAGNOSIS — I10 Essential (primary) hypertension: Secondary | ICD-10-CM | POA: Diagnosis not present

## 2022-06-17 DIAGNOSIS — E1122 Type 2 diabetes mellitus with diabetic chronic kidney disease: Secondary | ICD-10-CM | POA: Diagnosis not present

## 2022-06-17 DIAGNOSIS — K859 Acute pancreatitis without necrosis or infection, unspecified: Secondary | ICD-10-CM | POA: Diagnosis not present

## 2022-06-17 DIAGNOSIS — E8809 Other disorders of plasma-protein metabolism, not elsewhere classified: Secondary | ICD-10-CM | POA: Diagnosis not present

## 2022-06-17 LAB — GLUCOSE, CAPILLARY
Glucose-Capillary: 116 mg/dL — ABNORMAL HIGH (ref 70–99)
Glucose-Capillary: 135 mg/dL — ABNORMAL HIGH (ref 70–99)

## 2022-06-17 MED ORDER — OXYCODONE HCL 5 MG PO TABS: 5.0000 mg | ORAL_TABLET | Freq: Four times a day (QID) | ORAL | 0 refills | Status: DC | PRN

## 2022-06-17 NOTE — TOC Progression Note (Signed)
  Transition of Care [facility] Screening Note   Patient Details  Name: Taylor Obrien Date of Birth: December 14, 1965   Transition of Care [facility]) CM/SW Contact:    Boneta Lucks, RN Phone Number: 06/17/2022, 12:06 PM    Transition of Care Department [facility]) has reviewed patient and no TOC needs have been identified at this time. We will continue to monitor patient advancement through interdisciplinary progression rounds. If new patient transition needs arise, please place a TOC consult.    Clinical Narrative:      Expected Discharge Plan: Home/Self Care Barriers to Discharge: No Barriers Identified  Expected Discharge Plan and Services Expected Discharge Plan: Home/Self Care     Expected Discharge Date: 06/17/22

## 2022-06-17 NOTE — Progress Notes (Signed)
Pt. Manual BP 180/100. MD notified with instructions to give 10am amlodipine and labetalol.

## 2022-06-17 NOTE — Discharge Summary (Signed)
Physician Discharge Summary  Taylor Obrien EXN:[id number] DOB: [birth year] DOA: 06/14/2022  PCP: Darrol Jump, NP  Admit date: 06/14/2022 Discharge date: 06/17/2022  Admitted From: Home Disposition: Home  Recommendations for Outpatient Follow-up:  Follow up with PCP in 1-2 weeks Please obtain BMP/CBC in one week Already has follow-up scheduled with GI   Discharge Condition: Stable CODE STATUS: Full code Diet recommendation: Heart healthy  Brief/Interim Summary: 56 year old male, admitted with abdominal pain related to acute pancreatitis.  He has been dealing with pancreatitis/abdominal pain for the last 1 to 2 months.  Discharge Diagnoses:  Active Problems:   Mixed hyperlipidemia   ETOH abuse   Diabetes mellitus (HCC)   Tobacco abuse   Stroke (cerebrum) (HCC)   Essential hypertension   Acute pancreatitis without infection or necrosis   Stage 3a chronic kidney disease (CKD) (HCC)   Abdominal pain   Nausea and vomiting   Elevated lipase   Hypoalbuminemia due to protein-calorie malnutrition ([place])  Acute pancreatitis May also have component of chronic pancreatitis -Patient has been dealing with pancreatitis for at least a month -CT abdomen from 04/24/2022 shows ill-defined fluid collections in the pancreatic head and peripancreatic area.  These are unchanged on CT scan repeated on this admission -Continues to have significant abdominal pain requiring IV pain medicine -Initially treated with bowel rest, IV fluids and pain management -No biliary etiology identified -His wife said that has not had any alcohol in approximately 4 months -Triglycerides 221 -Initially started on clear liquids and said that he tolerated this well -Subsequently advanced heart healthy which she also tolerated -Reports that pain is significantly improved, currently receiving oxycodone -Plans to follow-up with GI as an outpatient, appointment already in place   CKD stage IIIa -Baseline creatinine  1.6-1.9 -Currently, creatinine is at baseline   Hypertension -Continued on amlodipine and labetalol   GERD -Continue Protonix   Diabetes, type II -Chronically on metformin and Jardiance which are currently on hold -Continue on sliding scale insulin  Discharge Instructions  Discharge Instructions     Diet - low sodium heart healthy   Complete by: As directed    Increase activity slowly   Complete by: As directed       Allergies as of 06/17/2022       Reactions   Oxcarbazepine Other (See Comments)   Patient goes out of right state of mind.         Medication List     STOP taking these medications    HYDROcodone-acetaminophen 5-325 MG tablet Commonly known as: NORCO/VICODIN       TAKE these medications    amLODipine 10 MG tablet Commonly known as: NORVASC Take 1 tablet (10 mg total) by mouth daily.   aspirin 325 MG tablet Take 1 tablet (325 mg total) by mouth daily.   blood glucose meter kit and supplies Kit Dispense based on patient and insurance preference. Use up to four times daily as directed. (FOR ICD-9 250.00, 250.01).   cloNIDine 0.3 mg/24hr patch Commonly known as: CATAPRES - Dosed in mg/24 hr Place 1 patch (0.3 mg total) onto the skin once a week. Next change on monday What changed: when to take this   diphenhydramine-acetaminophen 25-500 MG Tabs tablet Commonly known as: TYLENOL PM Take 1-2 tablets by mouth at bedtime as needed (for sleep). Depends on insomnia if takes 1-2 tablets   empagliflozin 25 MG Tabs tablet Commonly known as: JARDIANCE Take 25 mg by mouth daily.   escitalopram 10 MG tablet Commonly known  asLoma Sousa Take 10 mg by mouth daily.   fenofibrate 145 MG tablet Commonly known as: TRICOR Take 145 mg by mouth daily.   gabapentin 300 MG capsule Commonly known as: NEURONTIN Take 300 mg by mouth 2 (two) times daily.   glucose blood test strip Use as instructed   hydrOXYzine 25 MG tablet Commonly known as:  ATARAX Take 25 mg by mouth every 8 (eight) hours as needed for anxiety.   labetalol 300 MG tablet Commonly known as: NORMODYNE Take 300 mg by mouth 2 (two) times daily.   metFORMIN 500 MG tablet Commonly known as: GLUCOPHAGE Take 1 tablet (500 mg total) by mouth 2 (two) times daily with a meal.   nicotine 14 mg/24hr patch Commonly known as: NICODERM CQ - dosed in mg/24 hours Place 14 mg onto the skin daily.   oxyCODONE 5 MG immediate release tablet Commonly known as: Oxy IR/ROXICODONE Take 1 tablet (5 mg total) by mouth every 6 (six) hours as needed for moderate pain.   pantoprazole 40 MG tablet Commonly known as: PROTONIX Take 1 tablet (40 mg total) by mouth at bedtime.   traZODone 100 MG tablet Commonly known as: DESYREL Take 1 tablet (100 mg total) by mouth at bedtime.   triamcinolone cream 0.1 % Commonly known as: KENALOG Apply 1 application. topically daily.   Vascepa 1 g capsule Generic drug: icosapent Ethyl Take 2 g by mouth 2 (two) times daily.        Allergies  Allergen Reactions   Oxcarbazepine Other (See Comments)    Patient goes out of right state of mind.     Consultations:    Procedures/Studies: US Abdomen Limited  Result Date: 06/15/2022 CLINICAL DATA:  Abdominal pain. EXAM: ULTRASOUND ABDOMEN LIMITED RIGHT UPPER QUADRANT COMPARISON:  04/26/2022 FINDINGS: Gallbladder: No gallstones or gallbladder wall thickening. Sludge is visible in the lumen. No pericholecystic fluid. The sonographer reports no sonographic Murphy's sign. Common bile duct: Diameter: 4 mm Liver: Coarsening of the hepatic echotexture suggests steatosis. Portal vein is patent on color Doppler imaging with normal direction of blood flow towards the liver. Other: None. IMPRESSION: No evidence for gallstones or gallbladder wall thickening. Coarsening of the hepatic echotexture suggests steatosis. Electronically Signed   By: Misty Stanley M.D.   On: 06/15/2022 14:23   CT ABDOMEN PELVIS W  CONTRAST  Result Date: 06/14/2022 CLINICAL DATA:  Epigastric pain EXAM: CT ABDOMEN AND PELVIS WITH CONTRAST TECHNIQUE: Multidetector CT imaging of the abdomen and pelvis was performed using the standard protocol following bolus administration of intravenous contrast. RADIATION DOSE REDUCTION: This exam was performed according to the departmental dose-optimization program which includes automated exposure control, adjustment of the mA and/or kV according to patient size and/or use of iterative reconstruction technique. CONTRAST:  166m OMNIPAQUE IOHEXOL 300 MG/ML  SOLN COMPARISON:  06/04/2022, 04/24/2022 FINDINGS: Lower Chest: Normal. Hepatobiliary: Normal hepatic contours. No intra- or extrahepatic biliary dilatation. The gallbladder is normal. Pancreas: There is an intrapancreatic fluid collection at the pancreatic head that measures 3.3 x 1.8 cm. There is a smaller collection in the pancreatic body measuring 1.3 cm. There is mild residual peripancreatic edema. Spleen: Normal. Adrenals/Urinary Tract: The adrenal glands are normal. No hydronephrosis, nephroureterolithiasis or solid renal mass. The urinary bladder is normal for degree of distention Stomach/Bowel: There is no hiatal hernia. Normal duodenal course and caliber. No small bowel dilatation or inflammation. No focal colonic abnormality. Normal appendix. Vascular/Lymphatic: There is calcific atherosclerosis of the abdominal aorta. No lymphadenopathy. Reproductive: Normal prostate  size with symmetric seminal vesicles. Other: None. Musculoskeletal: No bony spinal canal stenosis or focal osseous abnormality. IMPRESSION: 1. Unchanged fluid collections in the pancreatic head and body compared to 04/24/2022. 2. Mild residual peripancreatic inflammatory change. 3.  Aortic atherosclerosis (ICD10-I70.0). Electronically Signed   By: Ulyses Jarred M.D.   On: 06/14/2022 20:18   CT ABDOMEN PELVIS WO CONTRAST  Result Date: 06/04/2022 CLINICAL DATA:  Right-sided  abdominal pain for 1 week. Patient with history of pancreatitis. EXAM: CT ABDOMEN AND PELVIS WITHOUT CONTRAST TECHNIQUE: Multidetector CT imaging of the abdomen and pelvis was performed following the standard protocol without IV contrast. RADIATION DOSE REDUCTION: This exam was performed according to the departmental dose-optimization program which includes automated exposure control, adjustment of the mA and/or kV according to patient size and/or use of iterative reconstruction technique. COMPARISON:  Most recent CT 04/24/2022, ultrasound 04/26/2022 FINDINGS: Lower chest: Patchy atelectasis in both lung bases. Mild cardiomegaly with coronary artery calcifications. No pleural fluid. Hepatobiliary: Borderline subjective hepatic steatosis. No evidence of focal liver lesion. Gallbladder is partially distended. Layering density likely corresponds to sludge seen on prior ultrasound. No calcified gallstone. Common bile duct is difficult to delineate but appears upper normal measuring 7 mm, series 5, image 46. Pancreas: Increased peripancreatic fat stranding about the head and uncinate process. The previous fluid collections within the pancreatic head and body are less well-defined on this current unenhanced exam, there is vague low-density in these regions. No pancreatic air. No obvious new collection allowing for noncontrast technique. There is no ductal dilatation. Spleen: Normal in size without focal abnormality. Adrenals/Urinary Tract: No adrenal nodule. Punctate nonobstructing stone in the lower right kidney. Calcifications of both renal hila are felt to be vascular. No hydronephrosis there is faint symmetric perinephric edema. The previously described right renal lesion is not well appreciated on the current noncontrast exam. No ureteral calculus. Partially distended urinary bladder without wall thickening. Stomach/Bowel: Detailed bowel assessment is limited in the absence of enteric contrast. Small hiatal hernia.  No abnormal gastric distension. No small bowel obstruction or inflammatory change. High-riding cecum in the right mid abdomen. Air-filled appendix without appendicitis. Multifocal colonic diverticulosis, no diverticulitis or acute colonic inflammation. Moderate volume of colonic stool. Vascular/Lymphatic: Age advanced aorto bi-iliac atherosclerosis. No aortic aneurysm. No portal venous or mesenteric gas. Circumaortic left renal vein. No obvious bulky adenopathy in this unenhanced exam. Reproductive: Prostate is unremarkable. Other: Peripancreatic fat stranding and inflammation. No significant ascites or other free fluid. Trace fat in the inguinal canals. Musculoskeletal: There are no acute or suspicious osseous abnormalities. Minor lumbar spondylosis. IMPRESSION: 1. Increased peripancreatic fat stranding about the head and uncinate process, consistent with acute pancreatitis. The previous fluid collections within the pancreatic head and body on CT last month are less well-defined on this current unenhanced exam, there is vague low-density in these regions. No pancreatic gas or definite new peripancreatic collection. 2. Punctate nonobstructing stone in the lower right kidney. 3. Colonic diverticulosis without diverticulitis. 4. 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 5. The indeterminate right renal lesion on prior CT is not well-defined on this unenhanced exam, please reference recommendations detailed on 04/24/2022 abdominopelvic CT. Aortic Atherosclerosis (ICD10-I70.0). Electronically Signed   By: Keith Rake M.D.   On: 06/04/2022 20:28      Subjective: Feels better.  Reports abdominal pain has improved.  Tolerating solid diet.  Discharge Exam: Vitals:   06/16/22 2117 06/17/22 0551 06/17/22 0606 06/17/22 0644  BP: (!) 171/100 (!) 167/102 (!) 180/100 (!) 154/94  Pulse:  80 71    Resp: 18 18    Temp: 99.2 F (37.3 C) 98.3 F (36.8 C)    TempSrc: Oral Oral    SpO2: 97% 96%    Weight:       Height:        General: Pt is alert, awake, not in acute distress Cardiovascular: RRR, S1/S2 +, no rubs, no gallops Respiratory: CTA bilaterally, no wheezing, no rhonchi Abdominal: Soft, NT, ND, bowel sounds + Extremities: no edema, no cyanosis    The results of significant diagnostics from this hospitalization (including imaging, microbiology, ancillary and laboratory) are listed below for reference.     Microbiology: No results found for this or any previous visit (from the past 240 hour(s)).   Labs: BNP (last 3 results) No results for input(s): "BNP" in the last 8760 hours. Basic Metabolic Panel: Recent Labs  Lab 06/14/22 1518 06/15/22 0432 06/16/22 0456  NA 135 140 139  K 3.9 3.9 4.1  CL 104 107 105  CO2 24 27 28   GLUCOSE 133* 93 120*  BUN 15 14 13   CREATININE 1.69* 1.53* 1.50*  CALCIUM 8.6* 8.6* 8.6*   Liver Function Tests: Recent Labs  Lab 06/14/22 1518 06/15/22 0432  AST 23 21  ALT 25 22  ALKPHOS 39 35*  BILITOT 0.7 0.8  PROT 6.9 5.9*  ALBUMIN 3.4* 2.9*   Recent Labs  Lab 06/14/22 1518  LIPASE 330*   No results for input(s): "AMMONIA" in the last 168 hours. CBC: Recent Labs  Lab 06/14/22 1518 06/15/22 0432  WBC 9.6 7.4  HGB 11.8* 10.8*  HCT 36.9* 33.4*  MCV 96.3 96.8  PLT 320 304   Cardiac Enzymes: No results for input(s): "CKTOTAL", "CKMB", "CKMBINDEX", "TROPONINI" in the last 168 hours. BNP: Invalid input(s): "POCBNP" CBG: Recent Labs  Lab 06/16/22 1129 06/16/22 1647 06/16/22 2119 06/17/22 0713 06/17/22 1119  GLUCAP 120* 153* 106* 116* 135*   D-Dimer No results for input(s): "DDIMER" in the last 72 hours. Hgb A1c No results for input(s): "HGBA1C" in the last 72 hours. Lipid Profile Recent Labs    06/16/22 0456  CHOL 156  HDL 13*  LDLCALC 99  TRIG 221*  CHOLHDL 12.0   Thyroid function studies No results for input(s): "TSH", "T4TOTAL", "T3FREE", "THYROIDAB" in the last 72 hours.  Invalid input(s): "FREET3" Anemia  work up No results for input(s): "VITAMINB12", "FOLATE", "FERRITIN", "TIBC", "IRON", "RETICCTPCT" in the last 72 hours. Urinalysis    Component Value Date/Time   COLORURINE YELLOW 06/14/2022 1504   APPEARANCEUR CLEAR 06/14/2022 1504   LABSPEC 1.006 06/14/2022 1504   PHURINE 6.0 06/14/2022 1504   GLUCOSEU >=500 (A) 06/14/2022 1504   HGBUR NEGATIVE 06/14/2022 1504   BILIRUBINUR NEGATIVE 06/14/2022 1504   KETONESUR NEGATIVE 06/14/2022 1504   PROTEINUR 100 (A) 06/14/2022 1504   UROBILINOGEN 0.2 09/22/2015 0750   NITRITE NEGATIVE 06/14/2022 1504   LEUKOCYTESUR NEGATIVE 06/14/2022 1504   Sepsis Labs Recent Labs  Lab 06/14/22 1518 06/15/22 0432  WBC 9.6 7.4   Microbiology No results found for this or any previous visit (from the past 240 hour(s)).   Time coordinating discharge: 51mns  SIGNED:   JKathie Dike MD  [name] 06/17/2022, 7:55 PM   If 7PM-7AM, please contact night-coverage [email or web address]

## 2022-06-17 NOTE — Progress Notes (Deleted)
Physician Discharge Summary  Taylor Obrien JKD:[id number] DOB: October 28, 1966 DOA: 06/14/2022  PCP: Darrol Jump, NP  Admit date: 06/14/2022 Discharge date: 06/17/2022  Admitted From: Home Disposition: Home  Recommendations for Outpatient Follow-up:  Follow up with PCP in 1-2 weeks Please obtain BMP/CBC in one week Already has follow-up scheduled with GI   Discharge Condition: Stable CODE STATUS: Full code Diet recommendation: Heart healthy  Brief/Interim Summary: 56 year old male, admitted with abdominal pain related to acute pancreatitis.  He has been dealing with pancreatitis/abdominal pain for the last 1 to 2 months.  Discharge Diagnoses:  Active Problems:   Mixed hyperlipidemia   ETOH abuse   Diabetes mellitus (HCC)   Tobacco abuse   Stroke (cerebrum) (HCC)   Essential hypertension   Acute pancreatitis without infection or necrosis   Stage 3a chronic kidney disease (CKD) (HCC)   Abdominal pain   Nausea and vomiting   Elevated lipase   Hypoalbuminemia due to protein-calorie malnutrition ([place])  Acute pancreatitis May also have component of chronic pancreatitis -Patient has been dealing with pancreatitis for at least a month -CT abdomen from 04/24/2022 shows ill-defined fluid collections in the pancreatic head and peripancreatic area.  These are unchanged on CT scan repeated on this admission -Continues to have significant abdominal pain requiring IV pain medicine -Initially treated with bowel rest, IV fluids and pain management -No biliary etiology identified -His wife said that has not had any alcohol in approximately 4 months -Triglycerides 221 -Initially started on clear liquids and said that he tolerated this well -Subsequently advanced heart healthy which she also tolerated -Reports that pain is significantly improved, currently receiving oxycodone -Plans to follow-up with GI as an outpatient, appointment already in place   CKD stage IIIa -Baseline creatinine  1.6-1.9 -Currently, creatinine is at baseline   Hypertension -Continued on amlodipine and labetalol   GERD -Continue Protonix   Diabetes, type II -Chronically on metformin and Jardiance which are currently on hold -Continue on sliding scale insulin  Discharge Instructions  Discharge Instructions     Diet - low sodium heart healthy   Complete by: As directed    Increase activity slowly   Complete by: As directed       Allergies as of 06/17/2022       Reactions   Oxcarbazepine Other (See Comments)   Patient goes out of right state of mind.         Medication List     STOP taking these medications    HYDROcodone-acetaminophen 5-325 MG tablet Commonly known as: NORCO/VICODIN       TAKE these medications    amLODipine 10 MG tablet Commonly known as: NORVASC Take 1 tablet (10 mg total) by mouth daily.   aspirin 325 MG tablet Take 1 tablet (325 mg total) by mouth daily.   blood glucose meter kit and supplies Kit Dispense based on patient and insurance preference. Use up to four times daily as directed. (FOR ICD-9 250.00, 250.01).   cloNIDine 0.3 mg/24hr patch Commonly known as: CATAPRES - Dosed in mg/24 hr Place 1 patch (0.3 mg total) onto the skin once a week. Next change on monday What changed: when to take this   diphenhydramine-acetaminophen 25-500 MG Tabs tablet Commonly known as: TYLENOL PM Take 1-2 tablets by mouth at bedtime as needed (for sleep). Depends on insomnia if takes 1-2 tablets   empagliflozin 25 MG Tabs tablet Commonly known as: JARDIANCE Take 25 mg by mouth daily.   escitalopram 10 MG tablet Commonly known  asLoma Sousa Take 10 mg by mouth daily.   fenofibrate 145 MG tablet Commonly known as: TRICOR Take 145 mg by mouth daily.   gabapentin 300 MG capsule Commonly known as: NEURONTIN Take 300 mg by mouth 2 (two) times daily.   glucose blood test strip Use as instructed   hydrOXYzine 25 MG tablet Commonly known as:  ATARAX Take 25 mg by mouth every 8 (eight) hours as needed for anxiety.   labetalol 300 MG tablet Commonly known as: NORMODYNE Take 300 mg by mouth 2 (two) times daily.   metFORMIN 500 MG tablet Commonly known as: GLUCOPHAGE Take 1 tablet (500 mg total) by mouth 2 (two) times daily with a meal.   nicotine 14 mg/24hr patch Commonly known as: NICODERM CQ - dosed in mg/24 hours Place 14 mg onto the skin daily.   oxyCODONE 5 MG immediate release tablet Commonly known as: Oxy IR/ROXICODONE Take 1 tablet (5 mg total) by mouth every 6 (six) hours as needed for moderate pain.   pantoprazole 40 MG tablet Commonly known as: PROTONIX Take 1 tablet (40 mg total) by mouth at bedtime.   traZODone 100 MG tablet Commonly known as: DESYREL Take 1 tablet (100 mg total) by mouth at bedtime.   triamcinolone cream 0.1 % Commonly known as: KENALOG Apply 1 application. topically daily.   Vascepa 1 g capsule Generic drug: icosapent Ethyl Take 2 g by mouth 2 (two) times daily.        Allergies  Allergen Reactions   Oxcarbazepine Other (See Comments)    Patient goes out of right state of mind.     Consultations:    Procedures/Studies: US Abdomen Limited  Result Date: 06/15/2022 CLINICAL DATA:  Abdominal pain. EXAM: ULTRASOUND ABDOMEN LIMITED RIGHT UPPER QUADRANT COMPARISON:  04/26/2022 FINDINGS: Gallbladder: No gallstones or gallbladder wall thickening. Sludge is visible in the lumen. No pericholecystic fluid. The sonographer reports no sonographic Murphy's sign. Common bile duct: Diameter: 4 mm Liver: Coarsening of the hepatic echotexture suggests steatosis. Portal vein is patent on color Doppler imaging with normal direction of blood flow towards the liver. Other: None. IMPRESSION: No evidence for gallstones or gallbladder wall thickening. Coarsening of the hepatic echotexture suggests steatosis. Electronically Signed   By: Misty Stanley M.D.   On: 06/15/2022 14:23   CT ABDOMEN PELVIS W  CONTRAST  Result Date: 06/14/2022 CLINICAL DATA:  Epigastric pain EXAM: CT ABDOMEN AND PELVIS WITH CONTRAST TECHNIQUE: Multidetector CT imaging of the abdomen and pelvis was performed using the standard protocol following bolus administration of intravenous contrast. RADIATION DOSE REDUCTION: This exam was performed according to the departmental dose-optimization program which includes automated exposure control, adjustment of the mA and/or kV according to patient size and/or use of iterative reconstruction technique. CONTRAST:  170m OMNIPAQUE IOHEXOL 300 MG/ML  SOLN COMPARISON:  06/04/2022, 04/24/2022 FINDINGS: Lower Chest: Normal. Hepatobiliary: Normal hepatic contours. No intra- or extrahepatic biliary dilatation. The gallbladder is normal. Pancreas: There is an intrapancreatic fluid collection at the pancreatic head that measures 3.3 x 1.8 cm. There is a smaller collection in the pancreatic body measuring 1.3 cm. There is mild residual peripancreatic edema. Spleen: Normal. Adrenals/Urinary Tract: The adrenal glands are normal. No hydronephrosis, nephroureterolithiasis or solid renal mass. The urinary bladder is normal for degree of distention Stomach/Bowel: There is no hiatal hernia. Normal duodenal course and caliber. No small bowel dilatation or inflammation. No focal colonic abnormality. Normal appendix. Vascular/Lymphatic: There is calcific atherosclerosis of the abdominal aorta. No lymphadenopathy. Reproductive: Normal prostate  size with symmetric seminal vesicles. Other: None. Musculoskeletal: No bony spinal canal stenosis or focal osseous abnormality. IMPRESSION: 1. Unchanged fluid collections in the pancreatic head and body compared to 04/24/2022. 2. Mild residual peripancreatic inflammatory change. 3.  Aortic atherosclerosis (ICD10-I70.0). Electronically Signed   By: Ulyses Jarred M.D.   On: 06/14/2022 20:18   CT ABDOMEN PELVIS WO CONTRAST  Result Date: 06/04/2022 CLINICAL DATA:  Right-sided  abdominal pain for 1 week. Patient with history of pancreatitis. EXAM: CT ABDOMEN AND PELVIS WITHOUT CONTRAST TECHNIQUE: Multidetector CT imaging of the abdomen and pelvis was performed following the standard protocol without IV contrast. RADIATION DOSE REDUCTION: This exam was performed according to the departmental dose-optimization program which includes automated exposure control, adjustment of the mA and/or kV according to patient size and/or use of iterative reconstruction technique. COMPARISON:  Most recent CT 04/24/2022, ultrasound 04/26/2022 FINDINGS: Lower chest: Patchy atelectasis in both lung bases. Mild cardiomegaly with coronary artery calcifications. No pleural fluid. Hepatobiliary: Borderline subjective hepatic steatosis. No evidence of focal liver lesion. Gallbladder is partially distended. Layering density likely corresponds to sludge seen on prior ultrasound. No calcified gallstone. Common bile duct is difficult to delineate but appears upper normal measuring 7 mm, series 5, image 46. Pancreas: Increased peripancreatic fat stranding about the head and uncinate process. The previous fluid collections within the pancreatic head and body are less well-defined on this current unenhanced exam, there is vague low-density in these regions. No pancreatic air. No obvious new collection allowing for noncontrast technique. There is no ductal dilatation. Spleen: Normal in size without focal abnormality. Adrenals/Urinary Tract: No adrenal nodule. Punctate nonobstructing stone in the lower right kidney. Calcifications of both renal hila are felt to be vascular. No hydronephrosis there is faint symmetric perinephric edema. The previously described right renal lesion is not well appreciated on the current noncontrast exam. No ureteral calculus. Partially distended urinary bladder without wall thickening. Stomach/Bowel: Detailed bowel assessment is limited in the absence of enteric contrast. Small hiatal hernia.  No abnormal gastric distension. No small bowel obstruction or inflammatory change. High-riding cecum in the right mid abdomen. Air-filled appendix without appendicitis. Multifocal colonic diverticulosis, no diverticulitis or acute colonic inflammation. Moderate volume of colonic stool. Vascular/Lymphatic: Age advanced aorto bi-iliac atherosclerosis. No aortic aneurysm. No portal venous or mesenteric gas. Circumaortic left renal vein. No obvious bulky adenopathy in this unenhanced exam. Reproductive: Prostate is unremarkable. Other: Peripancreatic fat stranding and inflammation. No significant ascites or other free fluid. Trace fat in the inguinal canals. Musculoskeletal: There are no acute or suspicious osseous abnormalities. Minor lumbar spondylosis. IMPRESSION: 1. Increased peripancreatic fat stranding about the head and uncinate process, consistent with acute pancreatitis. The previous fluid collections within the pancreatic head and body on CT last month are less well-defined on this current unenhanced exam, there is vague low-density in these regions. No pancreatic gas or definite new peripancreatic collection. 2. Punctate nonobstructing stone in the lower right kidney. 3. Colonic diverticulosis without diverticulitis. 4. 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 5. The indeterminate right renal lesion on prior CT is not well-defined on this unenhanced exam, please reference recommendations detailed on 04/24/2022 abdominopelvic CT. Aortic Atherosclerosis (ICD10-I70.0). Electronically Signed   By: Keith Rake M.D.   On: 06/04/2022 20:28      Subjective: Feels better.  Reports abdominal pain has improved.  Tolerating solid diet.  Discharge Exam: Vitals:   06/16/22 2117 06/17/22 0551 06/17/22 0606 06/17/22 0644  BP: (!) 171/100 (!) 167/102 (!) 180/100 (!) 154/94  Pulse:  80 71    Resp: 18 18    Temp: 99.2 F (37.3 C) 98.3 F (36.8 C)    TempSrc: Oral Oral    SpO2: 97% 96%    Weight:       Height:        General: Pt is alert, awake, not in acute distress Cardiovascular: RRR, S1/S2 +, no rubs, no gallops Respiratory: CTA bilaterally, no wheezing, no rhonchi Abdominal: Soft, NT, ND, bowel sounds + Extremities: no edema, no cyanosis    The results of significant diagnostics from this hospitalization (including imaging, microbiology, ancillary and laboratory) are listed below for reference.     Microbiology: No results found for this or any previous visit (from the past 240 hour(s)).   Labs: BNP (last 3 results) No results for input(s): "BNP" in the last 8760 hours. Basic Metabolic Panel: Recent Labs  Lab 06/14/22 1518 06/15/22 0432 06/16/22 0456  NA 135 140 139  K 3.9 3.9 4.1  CL 104 107 105  CO2 24 27 28   GLUCOSE 133* 93 120*  BUN 15 14 13   CREATININE 1.69* 1.53* 1.50*  CALCIUM 8.6* 8.6* 8.6*   Liver Function Tests: Recent Labs  Lab 06/14/22 1518 06/15/22 0432  AST 23 21  ALT 25 22  ALKPHOS 39 35*  BILITOT 0.7 0.8  PROT 6.9 5.9*  ALBUMIN 3.4* 2.9*   Recent Labs  Lab 06/14/22 1518  LIPASE 330*   No results for input(s): "AMMONIA" in the last 168 hours. CBC: Recent Labs  Lab 06/14/22 1518 06/15/22 0432  WBC 9.6 7.4  HGB 11.8* 10.8*  HCT 36.9* 33.4*  MCV 96.3 96.8  PLT 320 304   Cardiac Enzymes: No results for input(s): "CKTOTAL", "CKMB", "CKMBINDEX", "TROPONINI" in the last 168 hours. BNP: Invalid input(s): "POCBNP" CBG: Recent Labs  Lab 06/16/22 1129 06/16/22 1647 06/16/22 2119 06/17/22 0713 06/17/22 1119  GLUCAP 120* 153* 106* 116* 135*   D-Dimer No results for input(s): "DDIMER" in the last 72 hours. Hgb A1c No results for input(s): "HGBA1C" in the last 72 hours. Lipid Profile Recent Labs    06/16/22 0456  CHOL 156  HDL 13*  LDLCALC 99  TRIG 221*  CHOLHDL 12.0   Thyroid function studies No results for input(s): "TSH", "T4TOTAL", "T3FREE", "THYROIDAB" in the last 72 hours.  Invalid input(s): "FREET3" Anemia  work up No results for input(s): "VITAMINB12", "FOLATE", "FERRITIN", "TIBC", "IRON", "RETICCTPCT" in the last 72 hours. Urinalysis    Component Value Date/Time   COLORURINE YELLOW 06/14/2022 1504   APPEARANCEUR CLEAR 06/14/2022 1504   LABSPEC 1.006 06/14/2022 1504   PHURINE 6.0 06/14/2022 1504   GLUCOSEU >=500 (A) 06/14/2022 1504   HGBUR NEGATIVE 06/14/2022 1504   BILIRUBINUR NEGATIVE 06/14/2022 1504   KETONESUR NEGATIVE 06/14/2022 1504   PROTEINUR 100 (A) 06/14/2022 1504   UROBILINOGEN 0.2 09/22/2015 0750   NITRITE NEGATIVE 06/14/2022 1504   LEUKOCYTESUR NEGATIVE 06/14/2022 1504   Sepsis Labs Recent Labs  Lab 06/14/22 1518 06/15/22 0432  WBC 9.6 7.4   Microbiology No results found for this or any previous visit (from the past 240 hour(s)).   Time coordinating discharge: 1mns  SIGNED:   JKathie Dike MD  [name] 06/17/2022, 7:52 PM   If 7PM-7AM, please contact night-coverage [email or web address]

## 2022-06-22 ENCOUNTER — Encounter (HOSPITAL_COMMUNITY): Payer: Self-pay

## 2022-06-22 ENCOUNTER — Other Ambulatory Visit: Payer: Self-pay

## 2022-06-22 ENCOUNTER — Emergency Department (HOSPITAL_COMMUNITY)
Admission: EM | Admit: 2022-06-22 | Discharge: 2022-06-22 | Disposition: A | Discharge status: Home / Self Care | Payer: Medicare Other | Attending: Emergency Medicine | Admitting: Emergency Medicine

## 2022-06-22 DIAGNOSIS — Z79899 Other long term (current) drug therapy: Secondary | ICD-10-CM | POA: Diagnosis not present

## 2022-06-22 DIAGNOSIS — R1084 Generalized abdominal pain: Secondary | ICD-10-CM | POA: Diagnosis present

## 2022-06-22 DIAGNOSIS — R1010 Upper abdominal pain, unspecified: Secondary | ICD-10-CM | POA: Diagnosis not present

## 2022-06-22 DIAGNOSIS — Z7984 Long term (current) use of oral hypoglycemic drugs: Secondary | ICD-10-CM | POA: Insufficient documentation

## 2022-06-22 LAB — CBC
HCT: 38.4 % — ABNORMAL LOW (ref 39.0–52.0)
Hemoglobin: 12.5 g/dL — ABNORMAL LOW (ref 13.0–17.0)
MCH: 30.5 pg (ref 26.0–34.0)
MCHC: 32.6 g/dL (ref 30.0–36.0)
MCV: 93.7 fL (ref 80.0–100.0)
Platelets: 470 10*3/uL — ABNORMAL HIGH (ref 150–400)
RBC: 4.1 MIL/uL — ABNORMAL LOW (ref 4.22–5.81)
RDW: 14.9 % (ref 11.5–15.5)
WBC: 7.2 10*3/uL (ref 4.0–10.5)
nRBC: 0 % (ref 0.0–0.2)

## 2022-06-22 LAB — COMPREHENSIVE METABOLIC PANEL
ALT: 30 U/L (ref 0–44)
AST: 27 U/L (ref 15–41)
Albumin: 3.4 g/dL — ABNORMAL LOW (ref 3.5–5.0)
Alkaline Phosphatase: 41 U/L (ref 38–126)
Anion gap: 8 (ref 5–15)
BUN: 11 mg/dL (ref 6–20)
CO2: 25 mmol/L (ref 22–32)
Calcium: 9.4 mg/dL (ref 8.9–10.3)
Chloride: 107 mmol/L (ref 98–111)
Creatinine, Ser: 1.69 mg/dL — ABNORMAL HIGH (ref 0.61–1.24)
GFR, Estimated: 48 mL/min — ABNORMAL LOW (ref >60)
Glucose, Bld: 114 mg/dL — ABNORMAL HIGH (ref 70–99)
Potassium: 4.2 mmol/L (ref 3.5–5.1)
Sodium: 140 mmol/L (ref 135–145)
Total Bilirubin: 0.8 mg/dL (ref 0.3–1.2)
Total Protein: 7.1 g/dL (ref 6.5–8.1)

## 2022-06-22 LAB — LIPASE, BLOOD: Lipase: 285 U/L — ABNORMAL HIGH (ref 11–51)

## 2022-06-22 MED ORDER — HYDROMORPHONE HCL 4 MG PO TABS: 4.0000 mg | ORAL_TABLET | Freq: Four times a day (QID) | ORAL | 0 refills | Status: DC | PRN

## 2022-06-22 MED ORDER — ONDANSETRON HCL 4 MG/2ML IJ SOLN
4.0000 mg | Freq: Once | INTRAMUSCULAR | Status: AC
  Administered 2022-06-22: 4 mg via INTRAVENOUS
  Filled 2022-06-22: qty 2

## 2022-06-22 MED ORDER — HYDROMORPHONE HCL 1 MG/ML IJ SOLN
1.0000 mg | Freq: Once | INTRAMUSCULAR | Status: AC
  Administered 2022-06-22: 1 mg via INTRAVENOUS
  Filled 2022-06-22: qty 1

## 2022-06-22 MED ORDER — SODIUM CHLORIDE 0.9 % IV BOLUS
500.0000 mL | Freq: Once | INTRAVENOUS | Status: AC
  Administered 2022-06-22: 500 mL via INTRAVENOUS

## 2022-06-22 NOTE — ED Provider Notes (Signed)
[facility] Provider Note   CSN: [id number] Arrival date & time: 06/22/22  1029     History {Add pertinent medical, surgical, social history, OB history to HPI:1} Chief Complaint  Patient presents with   Abdominal Pain    Taylor Obrien is a 56 y.o. male.  Patient with a history of chronic abdominal pain from pancreatitis.  Complains of abdominal discomfort but no vomiting   Abdominal Pain      Home Medications Prior to Admission medications   Medication Sig Start Date End Date Taking? Authorizing Provider  HYDROmorphone (DILAUDID) 4 MG tablet Take 1 tablet (4 mg total) by mouth every 6 (six) hours as needed for severe pain. 06/22/22  Yes Milton Ferguson, MD  amLODipine (NORVASC) 10 MG tablet Take 1 tablet (10 mg total) by mouth daily. 01/19/16   Kirsteins, [name], MD  aspirin 325 MG tablet Take 1 tablet (325 mg total) by mouth daily. 12/20/15   Love, Ivan Anchors, PA-C  blood glucose meter kit and supplies KIT Dispense based on patient and insurance preference. Use up to four times daily as directed. (FOR ICD-9 250.00, 250.01). 03/18/17   Forde Dandy, MD  cloNIDine (CATAPRES - DOSED IN MG/24 HR) 0.3 mg/24hr patch Place 1 patch (0.3 mg total) onto the skin once a week. Next change on monday Patient taking differently: Place 0.3 mg onto the skin every Wednesday. Next change on monday 12/20/15   Love, Ivan Anchors, PA-C  diphenhydramine-acetaminophen (TYLENOL PM) 25-500 MG TABS tablet Take 1-2 tablets by mouth at bedtime as needed (for sleep). Depends on insomnia if takes 1-2 tablets    [provider]  empagliflozin (JARDIANCE) 25 MG TABS tablet Take 25 mg by mouth daily.     [provider]  escitalopram (LEXAPRO) 10 MG tablet Take 10 mg by mouth daily.    [provider]  fenofibrate (TRICOR) 145 MG tablet Take 145 mg by mouth daily. 04/10/22   [provider]  gabapentin (NEURONTIN) 300 MG capsule Take 300 mg by mouth 2 (two) times  daily. 05/09/22   [provider]  glucose blood test strip Use as instructed 03/18/17   Forde Dandy, MD  hydrOXYzine (ATARAX) 25 MG tablet Take 25 mg by mouth every 8 (eight) hours as needed for anxiety. 04/17/22   [provider]  labetalol (NORMODYNE) 300 MG tablet Take 300 mg by mouth 2 (two) times daily. 03/08/22   [provider]  metFORMIN (GLUCOPHAGE) 500 MG tablet Take 1 tablet (500 mg total) by mouth 2 (two) times daily with a meal. 03/18/17   Forde Dandy, MD  nicotine (NICODERM CQ - DOSED IN MG/24 HOURS) 14 mg/24hr patch Place 14 mg onto the skin daily. 05/10/22   [provider]  pantoprazole (PROTONIX) 40 MG tablet Take 1 tablet (40 mg total) by mouth at bedtime. 01/19/16   Kirsteins, [name], MD  traZODone (DESYREL) 100 MG tablet Take 1 tablet (100 mg total) by mouth at bedtime. 01/19/16   Kirsteins, [name], MD  triamcinolone cream (KENALOG) 0.1 % Apply 1 application. topically daily.    [provider]  VASCEPA 1 g capsule Take 2 g by mouth 2 (two) times daily. 03/12/22   [provider]      Allergies    Oxcarbazepine    Review of Systems   Review of Systems  Gastrointestinal:  Positive for abdominal pain.    Physical Exam Updated Vital Signs BP (!) 147/94 (BP  Location: Right Arm)   Pulse 78   Temp 99.3 F (37.4 C) (Oral)   Resp 16   Ht 5' 6"  (1.676 m)   Wt 95.3 kg   SpO2 100%   BMI 33.89 kg/m  Physical Exam  ED Results / Procedures / Treatments   Labs (all labs ordered are listed, but only abnormal results are displayed) Labs Reviewed  LIPASE, BLOOD - Abnormal; Notable for the following components:      Result Value   Lipase 285 (*)    All other components within normal limits  COMPREHENSIVE METABOLIC PANEL - Abnormal; Notable for the following components:   Glucose, Bld 114 (*)    Creatinine, Ser 1.69 (*)    Albumin 3.4 (*)    GFR, Estimated 48 (*)    All other components within normal limits  CBC -  Abnormal; Notable for the following components:   RBC 4.10 (*)    Hemoglobin 12.5 (*)    HCT 38.4 (*)    Platelets 470 (*)    All other components within normal limits    EKG None  Radiology No results found.  Procedures Procedures  {Document cardiac monitor, telemetry assessment procedure when appropriate:1}  Medications Ordered in ED Medications  sodium chloride 0.9 % bolus 500 mL (0 mLs Intravenous Stopped 06/22/22 1300)  HYDROmorphone (DILAUDID) injection 1 mg (1 mg Intravenous Given 06/22/22 1159)  ondansetron (ZOFRAN) injection 4 mg (4 mg Intravenous Given 06/22/22 1159)    ED Course/ Medical Decision Making/ A&P                           Medical Decision Making Amount and/or Complexity of Data Reviewed Labs: ordered.  Risk Prescription drug management.   Patient with chronic abdominal pain from chronic pancreatitis.  Patient was given Dilaudid initially in the emergency department and that helped with his pain.  He stated the Percocets did not help and he will be given some Dilaudid pills to take at home referred to GI  {Document critical care time when appropriate:1} {Document review of labs and clinical decision tools ie heart score, Chads2Vasc2 etc:1}  {Document your independent review of radiology images, and any outside records:1} {Document your discussion with family members, caretakers, and with consultants:1} {Document social determinants of health affecting pt's care:1} {Document your decision making why or why not admission, treatments were needed:1} Final Clinical Impression(s) / ED Diagnoses Final diagnoses:  Pain of upper abdomen    Rx / DC Orders ED Discharge Orders          Ordered    HYDROmorphone (DILAUDID) 4 MG tablet  Every 6 hours PRN        06/22/22 1453

## 2022-06-22 NOTE — ED Triage Notes (Signed)
Pt presents with LUQ abd pain. Pt states it is his pancreatitis and the pain never goes away. Pt states the pain is worse today.

## 2022-06-22 NOTE — Discharge Instructions (Signed)
Follow-up with your stomach doctor or Dr. Gala Romney or your family doctor in the next week

## 2022-07-08 ENCOUNTER — Ambulatory Visit (INDEPENDENT_AMBULATORY_CARE_PROVIDER_SITE_OTHER): Payer: Medicare Other | Admitting: Gastroenterology

## 2022-07-08 ENCOUNTER — Encounter: Payer: Self-pay | Admitting: Gastroenterology

## 2022-07-08 VITALS — BP 138/87 | HR 82 | Temp 97.8°F | Ht 65.0 in | Wt 220.0 lb

## 2022-07-08 DIAGNOSIS — K219 Gastro-esophageal reflux disease without esophagitis: Secondary | ICD-10-CM

## 2022-07-08 DIAGNOSIS — K5900 Constipation, unspecified: Secondary | ICD-10-CM

## 2022-07-08 DIAGNOSIS — K852 Alcohol induced acute pancreatitis without necrosis or infection: Secondary | ICD-10-CM | POA: Diagnosis not present

## 2022-07-08 DIAGNOSIS — D509 Iron deficiency anemia, unspecified: Secondary | ICD-10-CM | POA: Insufficient documentation

## 2022-07-08 DIAGNOSIS — R7401 Elevation of levels of liver transaminase levels: Secondary | ICD-10-CM | POA: Diagnosis not present

## 2022-07-08 NOTE — Patient Instructions (Signed)
Continue with low fat, low grease diet.  Congratulations on your sobriety! We will update pancreas imaging in 2 months or so. We will make arrangements when time. For constipation, add MiraLAX once capful daily. Colonoscopy and upper endoscopy to be scheduled. See separate instructions.

## 2022-07-08 NOTE — Progress Notes (Signed)
GI Office Note    Referring Provider: [facility],* Primary Care Physician:  Darrol Jump, NP  Primary Gastroenterologist: Elon Alas. Abbey Chatters, DO   Chief Complaint   Chief Complaint  Patient presents with   Hospitalization [facility] follow up for pancreatitis. Doing better now.     History of Present Illness   Taylor Obrien is a 56 y.o. male presenting today for hospital follow-up of pancreatitis, transaminitis.  Seen during May 2023 hospitalization.  Suspected acute alcoholic pancreatitis in the setting of significant daily beer consumption. At that time he admitted to drinking up to four 40 ounce beers daily.  Right upper quadrant ultrasound with gallbladder sludge but no choledocholithiasis or cholecystitis.  He had hepatic steatosis.  He had a bump in his liver enzymes on May 22 with AST of 91, ALT 90, alk phos 164, total bilirubin 1.3.  Hepatitis B surface antigen and hepatitis C antibody nonreactive.  AST rose to 169, ALT 148, alk phos 196. He desired discharge the day of consultation.   Since we saw him back in May during hospitalization, he has been hospitalized with pancreatitis from July 7 through July 9, June 27 through June 28 (signed out [place]).  7/15 2023: White blood cell count 7200, hemoglobin 12.5, hematocrit 38.4, MCV 93.7, platelets 470,000.  Sodium 140, potassium 4.2, creatinine 1.69, BUN 11, albumin 3.4, total bilirubin 0.8, alk phos 41, AST 27, ALT 30.  Lipase 285.  Triglycerides have been less than 300.  Patient states he has not consumed etoh since April, although admitted to etoh use during his 04/2022 hospitalization. Heavy etoh use before that.. Eating much better now. States he cut back on fried/greasy foods. Says he has no abdominal pain. In between admissions, he reports his abdominal pain would resolve but returned.  He believes changing his diet has helped.  Continues to smoke but states he is going to work on  giving that up soon.  From a GI standpoint, typically struggles with constipation.  Has a bowel movement every day, [place] 3.No melena, brbpr. Stool hard. Has chronic gerd but controlled with meds. Good appetite. No dysphagia.  Patient states he was on iron and other supplements for 6 or 7 years but this was stopped a few months prior to him being in the hospital.  No prior EGD or colonoscopy.    Right upper quadrant ultrasound June 15, 2022 IMPRESSION: No evidence for gallstones or gallbladder wall thickening. 2.   Coarsening of the hepatic echotexture suggests steatosis.  CT abdomen pelvis with contrast July 2023: IMPRESSION: 1. Unchanged fluid collections in the pancreatic head and body compared to 04/24/2022. 3.3 X 1.8cm and 1.3cm 2. Mild residual peripancreatic inflammatory change. 3.  Aortic atherosclerosis (ICD10-I70.0).   CT abdomen pelvis without contrast June 2023: IMPRESSION: 1. Increased peripancreatic fat stranding about the head and uncinate process, consistent with acute pancreatitis. The previous fluid collections within the pancreatic head and body on CT last month are less well-defined on this current unenhanced exam, there is vague low-density in these regions. No pancreatic gas or definite new peripancreatic collection. 2. Punctate nonobstructing stone in the lower right kidney. 3. Colonic diverticulosis without diverticulitis. 4. 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 5. The indeterminate right renal lesion on prior CT is not well-defined on this unenhanced exam, please reference recommendations detailed on 04/24/2022 abdominopelvic CT.  Right upper quadrant ultrasound May 2023: IMPRESSION: 1. Moderate gallbladder sludge without additional sonographic evidence to suggest  acute cholecystitis. 2.  Hepatic steatosis without focal mass.  CT abdomen pelvis with contrast May 2023: IMPRESSION: 1. Heterogeneous enlargement of the pancreatic head with  ill-defined fluid collections and surrounding inflammatory changes consistent with acute pancreatitis and acute peripancreatic fluid collections in this patient with an elevated serum lipase level. No evidence of pancreatic necrosis or hemorrhage. 2. No evidence of bowel obstruction or focal bowel lesion. 3. Indeterminate right renal lesion, statistically a mildly complex cyst which measures higher than water density. Consider nonemergent renal ultrasound for further characterization. Alternatively, if follow up abdominal CT is planned to address the patient's pancreatitis, inclusion of pre contrast images through the kidneys could be performed to better characterize this lesion. 4. Coronary and Aortic Atherosclerosis (ICD10-I70.0).  Medications   Current Outpatient Medications  Medication Sig Dispense Refill   amLODipine (NORVASC) 10 MG tablet Take 1 tablet (10 mg total) by mouth daily. 30 tablet 0   aspirin 325 MG tablet Take 1 tablet (325 mg total) by mouth daily. 100 tablet 0   cloNIDine (CATAPRES - DOSED IN MG/24 HR) 0.3 mg/24hr patch Place 1 patch (0.3 mg total) onto the skin once a week. Next change on monday (Patient taking differently: Place 0.3 mg onto the skin every Wednesday. Next change on monday) 4 patch 12   diphenhydramine-acetaminophen (TYLENOL PM) 25-500 MG TABS tablet Take 1-2 tablets by mouth at bedtime as needed (for sleep). Depends on insomnia if takes 1-2 tablets     empagliflozin (JARDIANCE) 25 MG TABS tablet Take 25 mg by mouth daily.      escitalopram (LEXAPRO) 10 MG tablet Take 10 mg by mouth daily.     fenofibrate (TRICOR) 145 MG tablet Take 145 mg by mouth daily.     hydrOXYzine (ATARAX) 25 MG tablet Take 25 mg by mouth every 8 (eight) hours as needed for anxiety.     labetalol (NORMODYNE) 300 MG tablet Take 300 mg by mouth 2 (two) times daily.     metFORMIN (GLUCOPHAGE) 500 MG tablet Take 1 tablet (500 mg total) by mouth 2 (two) times daily with a meal. 60  tablet 3   nicotine (NICODERM CQ - DOSED IN MG/24 HOURS) 14 mg/24hr patch Place 14 mg onto the skin daily.     oxyCODONE (OXY IR/ROXICODONE) 5 MG immediate release tablet Take 5 mg by mouth every 8 (eight) hours.     pantoprazole (PROTONIX) 40 MG tablet Take 1 tablet (40 mg total) by mouth at bedtime. 30 tablet 0   traZODone (DESYREL) 100 MG tablet Take 1 tablet (100 mg total) by mouth at bedtime. 30 tablet 1   triamcinolone cream (KENALOG) 0.1 % Apply 1 application. topically daily.     VASCEPA 1 g capsule Take 2 g by mouth 2 (two) times daily.     No current facility-administered medications for this visit.    Allergies   Allergies as of 07/08/2022 - Review Complete 07/08/2022  Allergen Reaction Noted   Oxcarbazepine Other (See Comments) 08/02/2011     Past Medical History   Past Medical History:  Diagnosis Date   Arthritis    Chronic pain    Depression    Diabetes mellitus without complication ([place])    Essential hypertension, benign    GERD (gastroesophageal reflux disease)    Headache    HTN (hypertension) 11/27/2015   Hyperlipidemia    Sleep apnea    cpap    Stroke ([place])    2016    Past Surgical History   Past  Surgical History:  Procedure Laterality Date   CLOSED REDUCTION MANDIBULAR FRACTURE W/ ARCH BARS     + multiple extractions   Condyloma resection     MULTIPLE EXTRACTIONS WITH ALVEOLOPLASTY Bilateral 01/23/2018   Procedure: DENTAL EXTRACTION OF TEETH NUMBER ONE, TWO, THREE, FOUR, FIVE, SIX, SEVEN, EIGHT, NINE, TEN, ELEVEN, TWELVE, THIRTEEN, FOURTEEN, FIFTEEN, SIXTEEN, SEVENTEEN, TWENTY, TWENTY-ONE, TWENTY-TWO, TWENTY-THREE, TWENTY-FOUR, TWENTY-FIVE, TWENTY-SIX, TWENTY-SEVEN, TWENTY-EIGHT, TWENTY-NINE, THIRTY-TWO WITH ALVEOLOPLASTY;  Surgeon: Diona Browner, DDS;  Location: [facility];  Service: Oral Surgery;  Lat   RADIOLOGY WITH ANESTHESIA N/A 11/18/2015   Procedure: RADIOLOGY WITH ANESTHESIA;  Surgeon: Luanne Bras, MD;  Location: [facility];  Service: Radiology;   Laterality: N/A;   Removal foreign body right shoulder     Right rotator cuff repair     TEE WITHOUT CARDIOVERSION N/A 11/21/2015   Procedure: TRANSESOPHAGEAL ECHOCARDIOGRAM (TEE);  Surgeon: Larey Dresser, MD;  Location: [facility];  Service: Cardiovascular;  Laterality: N/A;   TOOTH EXTRACTION N/A 01/24/2018   Procedure: SUTURE ORAL WOUND;  Surgeon: Diona Browner, DDS;  Location: [facility];  Service: Oral Surgery;  Laterality: N/A;    Past Family History   Family History  Problem Relation Age of Onset   Diabetes Mother    Hypertension Mother    Drug abuse Mother    Hyperlipidemia Mother    Diabetes Father    Hypertension Father    Hyperlipidemia Father    Diabetes Brother    Hypertension Brother     Past Social History   Social History   Socioeconomic History   Marital status: Married    Spouse name: Not on file   Number of children: Not on file   Years of education: Not on file   Highest education level: Not on file  Occupational History   Not on file  Tobacco Use   Smoking status: Every Day    Packs/day: 0.50    Years: 30.00    Total pack years: 15.00    Types: Cigarettes    Last attempt to quit: 11/17/2015    Years since quitting: 6.6   Smokeless tobacco: Never  Vaping Use   Vaping Use: Never used  Substance and Sexual Activity   Alcohol use: Not Currently    Comment: 2 40 oz daily. 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 Stopped liquor 5 years ago.states no etoh since 03/2022   Drug use: Yes    Types: Marijuana    Comment: occassionally   Sexual activity: Yes    Birth control/protection: None  Other Topics Concern   Not on file  Social History Narrative   Not on file   Social Determinants of Health   Financial Resource Strain: Not on file  Food Insecurity: Not on file  Transportation Needs: Not on file  Physical Activity: Not on file  Stress: Not on file  Social Connections: Not on file  Intimate Partner Violence: Not on file    Review of Systems    General: Negative for anorexia, weight loss, fever, chills, fatigue, +chronic weakness.wheelchair since cva, remote. Transfers from chair to bed etc. ENT: Negative for hoarseness, difficulty swallowing , nasal congestion. CV: Negative for chest pain, angina, palpitations, dyspnea on exertion, peripheral edema.  Respiratory: Negative for dyspnea at rest, dyspnea on exertion, cough, sputum, wheezing.  GI: See history of present illness. GU:  Negative for dysuria, hematuria, urinary incontinence, urinary frequency, nocturnal urination.  Endo: Negative for unusual weight change.     Physical Exam   BP  138/87 (BP Location: Left Arm, Patient Position: Sitting, Cuff Size: Normal)   Pulse 82   Temp 97.8 F (36.6 C) (Temporal)   Ht _0  (1.651 m)   Wt 220 lb (99.8 kg) Comment: unable to weight, pt reported  SpO2 99%   BMI 36.61 kg/m    General: Well-nourished, well-developed in no acute distress. Appears older than state age Eyes: No icterus. Mouth: Oropharyngeal mucosa moist and pink , no lesions erythema or exudate. Lungs: Clear to auscultation bilaterally.  Heart: Regular rate and rhythm, no murmurs rubs or gallops.  Abdomen: Bowel sounds are normal, nontender, nondistended, no hepatosplenomegaly or masses,  no abdominal bruits or hernia, no rebound or guarding. Examined on exam table. +rectus diastasis Rectal: not performed  Extremities: No lower extremity edema. No clubbing or deformities. Neuro: Alert and oriented x 4   Skin: Warm and dry, no jaundice.   Psych: Alert and cooperative, normal mood and affect.  Labs   Lab Results  Component Value Date   CREATININE 1.69 (H) 06/22/2022   BUN 11 06/22/2022   NA 140 06/22/2022   K 4.2 06/22/2022   CL 107 06/22/2022   CO2 25 06/22/2022   Lab Results  Component Value Date   ALT 30 06/22/2022   AST 27 06/22/2022   ALKPHOS 41 06/22/2022   BILITOT 0.8 06/22/2022   Lab Results  Component Value Date   WBC 7.2 06/22/2022    HGB 12.5 (L) 06/22/2022   HCT 38.4 (L) 06/22/2022   MCV 93.7 06/22/2022   PLT 470 (H) 06/22/2022   Lab Results  Component Value Date   IRON 32 (L) 04/27/2022   TIBC 213 (L) 04/27/2022   FERRITIN 557 (H) 04/27/2022   No results found for: "FOLATE" No results found for: "VITAMINB12" Lab Results  Component Value Date   HGBA1C 7.7 (H) 04/25/2022    Imaging Studies   US Abdomen Limited  Result Date: 06/15/2022 CLINICAL DATA:  Abdominal pain. EXAM: ULTRASOUND ABDOMEN LIMITED RIGHT UPPER QUADRANT COMPARISON:  04/26/2022 FINDINGS: Gallbladder: No gallstones or gallbladder wall thickening. Sludge is visible in the lumen. No pericholecystic fluid. The sonographer reports no sonographic Murphy's sign. Common bile duct: Diameter: 4 mm Liver: Coarsening of the hepatic echotexture suggests steatosis. Portal vein is patent on color Doppler imaging with normal direction of blood flow towards the liver. Other: None. IMPRESSION: No evidence for gallstones or gallbladder wall thickening. Coarsening of the hepatic echotexture suggests steatosis. Electronically Signed   By: Misty Stanley M.D.   On: 06/15/2022 14:23   CT ABDOMEN PELVIS W CONTRAST  Result Date: 06/14/2022 CLINICAL DATA:  Epigastric pain EXAM: CT ABDOMEN AND PELVIS WITH CONTRAST TECHNIQUE: Multidetector CT imaging of the abdomen and pelvis was performed using the standard protocol following bolus administration of intravenous contrast. RADIATION DOSE REDUCTION: This exam was performed according to the departmental dose-optimization program which includes automated exposure control, adjustment of the mA and/or kV according to patient size and/or use of iterative reconstruction technique. CONTRAST:  155m OMNIPAQUE IOHEXOL 300 MG/ML  SOLN COMPARISON:  06/04/2022, 04/24/2022 FINDINGS: Lower Chest: Normal. Hepatobiliary: Normal hepatic contours. No intra- or extrahepatic biliary dilatation. The gallbladder is normal. Pancreas: There is an  intrapancreatic fluid collection at the pancreatic head that measures 3.3 x 1.8 cm. There is a smaller collection in the pancreatic body measuring 1.3 cm. There is mild residual peripancreatic edema. Spleen: Normal. Adrenals/Urinary Tract: The adrenal glands are normal. No hydronephrosis, nephroureterolithiasis or solid renal mass. The urinary bladder is normal  for degree of distention Stomach/Bowel: There is no hiatal hernia. Normal duodenal course and caliber. No small bowel dilatation or inflammation. No focal colonic abnormality. Normal appendix. Vascular/Lymphatic: There is calcific atherosclerosis of the abdominal aorta. No lymphadenopathy. Reproductive: Normal prostate size with symmetric seminal vesicles. Other: None. Musculoskeletal: No bony spinal canal stenosis or focal osseous abnormality. IMPRESSION: 1. Unchanged fluid collections in the pancreatic head and body compared to 04/24/2022. 2. Mild residual peripancreatic inflammatory change. 3.  Aortic atherosclerosis (ICD10-I70.0). Electronically Signed   By: Ulyses Jarred M.D.   On: 06/14/2022 20:18    Assessment     Pancreatitis: likely etoh related, clinically resolved, with most recent hospitalization just a few weeks ago. Abnormal pancreas on multiple CTs likely sequelae of pancreatitis but will need future imaging to rule out underlying mass.  GERD: well controlled on PPI, chronic for years.  Constipation: poorly managed  IDA: mildly low H/H, serum iron, iron sat but also with low TIBC. Ferritin elevated in setting of pancreatitis. Likely mixed anemia. No prior colonoscopy or upper endoscopy. Need to consider occult GI bleeding as cause of IDA.  Elevated LFTs: previously elevated in setting of pancreatitis/etoh use. Possibly reactive from surrounding inflammation due to pancreatitis vs biliary vs etoh. LFTs normal last month, patient no longer drinking. Viral markers negative for Hep B and C. Fatty liver noted on imaging.    Indeterminate right renal lesion: can be assess at time of upcoming CT to follow up on pancreatitis.  PLAN   Continue etoh cessation. Patient congratulated on his sobreity. Continue low fat diet.  Recommend CT abd pancreatic protocol in 4-6 weeks (8 weeks after last imaging). Reassess indeterminate right renal lesion at same time. Use miralax once daily for constipation. Recommend colonoscopy and upper endoscopy in near future. ASA 3.  I have discussed the risks, alternatives, benefits with regards to but not limited to the risk of reaction to medication, bleeding, infection, perforation and the patient is agreeable to proceed. Written consent to be obtained.    Laureen Ochs. Bobby Rumpf, [place], [facility]

## 2022-07-09 ENCOUNTER — Telehealth: Payer: Self-pay | Admitting: *Deleted

## 2022-07-09 NOTE — Telephone Encounter (Signed)
Called pt, no answer, no VM to schedule TCS/egd with Dr. Abbey Chatters, ASA 3

## 2022-07-11 NOTE — Telephone Encounter (Signed)
LM to have pt return call

## 2022-07-12 NOTE — Telephone Encounter (Signed)
Letter mailed

## 2022-07-13 ENCOUNTER — Telehealth: Payer: Self-pay | Admitting: Gastroenterology

## 2022-07-13 DIAGNOSIS — N289 Disorder of kidney and ureter, unspecified: Secondary | ICD-10-CM

## 2022-07-13 DIAGNOSIS — K852 Alcohol induced acute pancreatitis without necrosis or infection: Secondary | ICD-10-CM

## 2022-07-13 NOTE — Telephone Encounter (Signed)
Patient needs CT Abd pancreatic protocol to reassess abnormal pancreas/post pancreaittis/rule out underlying pancreatic malignancy. Also evaluate indeterminate right renal lesion.   Schedule for 4-6 weeks from now. Patient was made aware of need at time of recent ov.  He will need updated met-7 prior to ct to assess renal function.

## 2022-07-15 ENCOUNTER — Encounter: Payer: Self-pay | Admitting: *Deleted

## 2022-07-15 MED ORDER — PEG 3350-KCL-NA BICARB-NACL 420 G PO SOLR: 4000.0000 mL | Freq: Once | ORAL | 0 refills | Status: AC

## 2022-07-15 NOTE — Telephone Encounter (Signed)
Spoke with pt. Scheduled for 9/15 at 10:30am. Advised will send instructions/pre-op to mychart. Rx sent to pharmacy.

## 2022-07-15 NOTE — Addendum Note (Signed)
Addended by: Cheron Every on: 07/15/2022 10:16 AM   Modules accepted: Orders

## 2022-07-15 NOTE — Telephone Encounter (Signed)
CT scheduled and made pt aware. Also letter sent to [facility].

## 2022-07-15 NOTE — Addendum Note (Signed)
Addended by: Cheron Every on: 07/15/2022 10:37 AM   Modules accepted: Orders

## 2022-07-19 ENCOUNTER — Other Ambulatory Visit: Payer: Self-pay

## 2022-07-19 ENCOUNTER — Observation Stay (HOSPITAL_COMMUNITY)
Admission: EM | Admit: 2022-07-19 | Discharge: 2022-07-20 | Disposition: A | Discharge status: Home / Self Care | Payer: Medicare Other | Attending: Internal Medicine | Admitting: Internal Medicine

## 2022-07-19 ENCOUNTER — Emergency Department (HOSPITAL_COMMUNITY): Payer: Medicare Other

## 2022-07-19 ENCOUNTER — Encounter (HOSPITAL_COMMUNITY): Payer: Self-pay

## 2022-07-19 DIAGNOSIS — N281 Cyst of kidney, acquired: Secondary | ICD-10-CM | POA: Insufficient documentation

## 2022-07-19 DIAGNOSIS — K859 Acute pancreatitis without necrosis or infection, unspecified: Principal | ICD-10-CM | POA: Diagnosis present

## 2022-07-19 DIAGNOSIS — D649 Anemia, unspecified: Secondary | ICD-10-CM | POA: Diagnosis present

## 2022-07-19 DIAGNOSIS — R1084 Generalized abdominal pain: Secondary | ICD-10-CM | POA: Diagnosis present

## 2022-07-19 DIAGNOSIS — N179 Acute kidney failure, unspecified: Secondary | ICD-10-CM | POA: Insufficient documentation

## 2022-07-19 DIAGNOSIS — E119 Type 2 diabetes mellitus without complications: Secondary | ICD-10-CM

## 2022-07-19 DIAGNOSIS — Z7982 Long term (current) use of aspirin: Secondary | ICD-10-CM | POA: Insufficient documentation

## 2022-07-19 DIAGNOSIS — Z8673 Personal history of transient ischemic attack (TIA), and cerebral infarction without residual deficits: Secondary | ICD-10-CM | POA: Insufficient documentation

## 2022-07-19 DIAGNOSIS — K219 Gastro-esophageal reflux disease without esophagitis: Secondary | ICD-10-CM | POA: Diagnosis present

## 2022-07-19 DIAGNOSIS — E1122 Type 2 diabetes mellitus with diabetic chronic kidney disease: Secondary | ICD-10-CM | POA: Diagnosis not present

## 2022-07-19 DIAGNOSIS — F32A Depression, unspecified: Secondary | ICD-10-CM | POA: Diagnosis present

## 2022-07-19 DIAGNOSIS — N1831 Chronic kidney disease, stage 3a: Secondary | ICD-10-CM | POA: Diagnosis not present

## 2022-07-19 DIAGNOSIS — Z79899 Other long term (current) drug therapy: Secondary | ICD-10-CM | POA: Insufficient documentation

## 2022-07-19 DIAGNOSIS — I129 Hypertensive chronic kidney disease with stage 1 through stage 4 chronic kidney disease, or unspecified chronic kidney disease: Secondary | ICD-10-CM | POA: Insufficient documentation

## 2022-07-19 DIAGNOSIS — Z7901 Long term (current) use of anticoagulants: Secondary | ICD-10-CM | POA: Diagnosis not present

## 2022-07-19 DIAGNOSIS — I10 Essential (primary) hypertension: Secondary | ICD-10-CM | POA: Diagnosis present

## 2022-07-19 DIAGNOSIS — K5909 Other constipation: Secondary | ICD-10-CM | POA: Insufficient documentation

## 2022-07-19 DIAGNOSIS — F17210 Nicotine dependence, cigarettes, uncomplicated: Secondary | ICD-10-CM | POA: Insufficient documentation

## 2022-07-19 DIAGNOSIS — Z7984 Long term (current) use of oral hypoglycemic drugs: Secondary | ICD-10-CM | POA: Insufficient documentation

## 2022-07-19 LAB — CBC WITH DIFFERENTIAL/PLATELET
Abs Immature Granulocytes: 0.03 10*3/uL (ref 0.00–0.07)
Basophils Absolute: 0 10*3/uL (ref 0.0–0.1)
Basophils Relative: 0 %
Eosinophils Absolute: 0.2 10*3/uL (ref 0.0–0.5)
Eosinophils Relative: 1 %
HCT: 43.6 % (ref 39.0–52.0)
Hemoglobin: 14.1 g/dL (ref 13.0–17.0)
Immature Granulocytes: 0 %
Lymphocytes Relative: 10 %
Lymphs Abs: 1.1 10*3/uL (ref 0.7–4.0)
MCH: 29.4 pg (ref 26.0–34.0)
MCHC: 32.3 g/dL (ref 30.0–36.0)
MCV: 91 fL (ref 80.0–100.0)
Monocytes Absolute: 1.2 10*3/uL — ABNORMAL HIGH (ref 0.1–1.0)
Monocytes Relative: 11 %
Neutro Abs: 8.5 10*3/uL — ABNORMAL HIGH (ref 1.7–7.7)
Neutrophils Relative %: 78 %
Platelets: 363 10*3/uL (ref 150–400)
RBC: 4.79 MIL/uL (ref 4.22–5.81)
RDW: 15 % (ref 11.5–15.5)
WBC: 11 10*3/uL — ABNORMAL HIGH (ref 4.0–10.5)
nRBC: 0 % (ref 0.0–0.2)

## 2022-07-19 LAB — COMPREHENSIVE METABOLIC PANEL
ALT: 34 U/L (ref 0–44)
AST: 29 U/L (ref 15–41)
Albumin: 3.4 g/dL — ABNORMAL LOW (ref 3.5–5.0)
Alkaline Phosphatase: 42 U/L (ref 38–126)
Anion gap: 9 (ref 5–15)
BUN: 19 mg/dL (ref 6–20)
CO2: 25 mmol/L (ref 22–32)
Calcium: 9 mg/dL (ref 8.9–10.3)
Chloride: 105 mmol/L (ref 98–111)
Creatinine, Ser: 1.8 mg/dL — ABNORMAL HIGH (ref 0.61–1.24)
GFR, Estimated: 44 mL/min — ABNORMAL LOW (ref >60)
Glucose, Bld: 130 mg/dL — ABNORMAL HIGH (ref 70–99)
Potassium: 4.3 mmol/L (ref 3.5–5.1)
Sodium: 139 mmol/L (ref 135–145)
Total Bilirubin: 0.9 mg/dL (ref 0.3–1.2)
Total Protein: 7.3 g/dL (ref 6.5–8.1)

## 2022-07-19 LAB — PROTIME-INR
INR: 1.2 (ref 0.8–1.2)
Prothrombin Time: 15 seconds (ref 11.4–15.2)

## 2022-07-19 LAB — LIPID PANEL
Cholesterol: 152 mg/dL (ref 0–200)
HDL: 11 mg/dL — ABNORMAL LOW (ref >40)
LDL Cholesterol: 98 mg/dL (ref 0–99)
Total CHOL/HDL Ratio: 13.8 RATIO
Triglycerides: 217 mg/dL — ABNORMAL HIGH (ref <150)
VLDL: 43 mg/dL — ABNORMAL HIGH (ref 0–40)

## 2022-07-19 LAB — LIPASE, BLOOD: Lipase: 752 U/L — ABNORMAL HIGH (ref 11–51)

## 2022-07-19 LAB — ETHANOL: Alcohol, Ethyl (B): <10 mg/dL (ref <10)

## 2022-07-19 MED ORDER — NICOTINE 14 MG/24HR TD PT24
14.0000 mg | MEDICATED_PATCH | Freq: Every day | TRANSDERMAL | Status: DC
Start: 2022-07-19 — End: 2022-07-20
  Administered 2022-07-19 – 2022-07-20 (×2): 14 mg via TRANSDERMAL
  Filled 2022-07-19 (×2): qty 1

## 2022-07-19 MED ORDER — OXYCODONE HCL 5 MG PO TABS
5.0000 mg | ORAL_TABLET | Freq: Three times a day (TID) | ORAL | Status: DC
  Administered 2022-07-19 – 2022-07-20 (×3): 5 mg via ORAL
  Filled 2022-07-19 (×3): qty 1

## 2022-07-19 MED ORDER — TRAZODONE HCL 50 MG PO TABS
100.0000 mg | ORAL_TABLET | Freq: Every day | ORAL | Status: DC
  Administered 2022-07-19: 100 mg via ORAL
  Filled 2022-07-19: qty 2

## 2022-07-19 MED ORDER — ONDANSETRON HCL 4 MG/2ML IJ SOLN
4.0000 mg | Freq: Once | INTRAMUSCULAR | Status: AC
  Administered 2022-07-19: 4 mg via INTRAVENOUS
  Filled 2022-07-19: qty 2

## 2022-07-19 MED ORDER — PANTOPRAZOLE SODIUM 40 MG PO TBEC
40.0000 mg | DELAYED_RELEASE_TABLET | Freq: Every day | ORAL | Status: DC
  Administered 2022-07-19: 40 mg via ORAL
  Filled 2022-07-19: qty 1

## 2022-07-19 MED ORDER — ONDANSETRON HCL 4 MG/2ML IJ SOLN
4.0000 mg | Freq: Four times a day (QID) | INTRAMUSCULAR | Status: DC | PRN
  Administered 2022-07-19: 4 mg via INTRAVENOUS
  Filled 2022-07-19: qty 2

## 2022-07-19 MED ORDER — HEPARIN SODIUM (PORCINE) 5000 UNIT/ML IJ SOLN
5000.0000 [IU] | Freq: Three times a day (TID) | INTRAMUSCULAR | Status: DC
  Administered 2022-07-19 – 2022-07-20 (×3): 5000 [IU] via SUBCUTANEOUS
  Filled 2022-07-19 (×3): qty 1

## 2022-07-19 MED ORDER — PANTOPRAZOLE SODIUM 40 MG IV SOLR
40.0000 mg | Freq: Once | INTRAVENOUS | Status: AC
  Administered 2022-07-19: 40 mg via INTRAVENOUS
  Filled 2022-07-19: qty 10

## 2022-07-19 MED ORDER — AMLODIPINE BESYLATE 5 MG PO TABS
10.0000 mg | ORAL_TABLET | Freq: Every day | ORAL | Status: DC
  Administered 2022-07-19 – 2022-07-20 (×2): 10 mg via ORAL
  Filled 2022-07-19 (×2): qty 2

## 2022-07-19 MED ORDER — CLONIDINE HCL 0.2 MG/24HR TD PTWK: 0.3000 mg | MEDICATED_PATCH | TRANSDERMAL | Status: DC

## 2022-07-19 MED ORDER — HYDROMORPHONE HCL 1 MG/ML IJ SOLN
0.5000 mg | INTRAMUSCULAR | Status: DC | PRN
  Administered 2022-07-19 (×2): 1 mg via INTRAVENOUS
  Administered 2022-07-19: 0.5 mg via INTRAVENOUS
  Administered 2022-07-20: 1 mg via INTRAVENOUS
  Filled 2022-07-19 (×2): qty 1
  Filled 2022-07-19: qty 0.5
  Filled 2022-07-19: qty 1

## 2022-07-19 MED ORDER — ACETAMINOPHEN 650 MG RE SUPP: 650.0000 mg | Freq: Four times a day (QID) | RECTAL | Status: DC | PRN

## 2022-07-19 MED ORDER — SODIUM CHLORIDE 0.9 % IV BOLUS
1000.0000 mL | Freq: Once | INTRAVENOUS | Status: AC
  Administered 2022-07-19: 1000 mL via INTRAVENOUS

## 2022-07-19 MED ORDER — ICOSAPENT ETHYL 1 G PO CAPS
2.0000 g | ORAL_CAPSULE | Freq: Two times a day (BID) | ORAL | Status: DC
  Filled 2022-07-19 (×7): qty 2

## 2022-07-19 MED ORDER — ASPIRIN 325 MG PO TABS
325.0000 mg | ORAL_TABLET | Freq: Every day | ORAL | Status: DC
  Administered 2022-07-19 – 2022-07-20 (×2): 325 mg via ORAL
  Filled 2022-07-19 (×2): qty 1

## 2022-07-19 MED ORDER — LACTATED RINGERS IV SOLN: INTRAVENOUS | Status: DC

## 2022-07-19 MED ORDER — LABETALOL HCL 200 MG PO TABS
300.0000 mg | ORAL_TABLET | Freq: Two times a day (BID) | ORAL | Status: DC
  Administered 2022-07-19 – 2022-07-20 (×3): 300 mg via ORAL
  Filled 2022-07-19 (×3): qty 2

## 2022-07-19 MED ORDER — FENOFIBRATE 160 MG PO TABS
160.0000 mg | ORAL_TABLET | Freq: Every day | ORAL | Status: DC
  Administered 2022-07-19 – 2022-07-20 (×2): 160 mg via ORAL
  Filled 2022-07-19 (×2): qty 1

## 2022-07-19 MED ORDER — BOOST / RESOURCE BREEZE PO LIQD CUSTOM
1.0000 | Freq: Three times a day (TID) | ORAL | Status: DC
  Administered 2022-07-19: 1 via ORAL

## 2022-07-19 MED ORDER — IOHEXOL 300 MG/ML  SOLN
100.0000 mL | Freq: Once | INTRAMUSCULAR | Status: AC | PRN
Start: 2022-07-19 — End: 2022-07-19
  Administered 2022-07-19: 80 mL via INTRAVENOUS

## 2022-07-19 MED ORDER — ONDANSETRON HCL 4 MG PO TABS: 4.0000 mg | ORAL_TABLET | Freq: Four times a day (QID) | ORAL | Status: DC | PRN

## 2022-07-19 MED ORDER — IOHEXOL 9 MG/ML PO SOLN
ORAL | Status: AC
  Filled 2022-07-19: qty 500

## 2022-07-19 MED ORDER — MORPHINE SULFATE (PF) 4 MG/ML IV SOLN
4.0000 mg | Freq: Once | INTRAVENOUS | Status: AC
  Administered 2022-07-19: 4 mg via INTRAVENOUS
  Filled 2022-07-19: qty 1

## 2022-07-19 MED ORDER — HYDROXYZINE HCL 25 MG PO TABS: 25.0000 mg | ORAL_TABLET | Freq: Three times a day (TID) | ORAL | Status: DC | PRN

## 2022-07-19 MED ORDER — ACETAMINOPHEN 325 MG PO TABS: 650.0000 mg | ORAL_TABLET | Freq: Four times a day (QID) | ORAL | Status: DC | PRN

## 2022-07-19 MED ORDER — ESCITALOPRAM OXALATE 10 MG PO TABS
10.0000 mg | ORAL_TABLET | Freq: Every day | ORAL | Status: DC
  Administered 2022-07-19 – 2022-07-20 (×2): 10 mg via ORAL
  Filled 2022-07-19 (×2): qty 1

## 2022-07-19 NOTE — H&P (Signed)
History and Physical    Taylor Obrien KCL:[id number] DOB: 10-30-1966 DOA: 07/19/2022  PCP: Darrol Jump, NP   Patient coming from: Home  Chief Complaint: Abd pain, N/V  HPI: Taylor Obrien is a 56 y.o. male with medical history significant for recurrent pancreatitis, prior alcohol abuse, ongoing tobacco abuse, hypertension, type 2 diabetes, dyslipidemia, and GERD who presented to the ED with complaints of nausea and vomiting along with epigastric abdominal pain radiating to the back that started approximately 3-4 days ago after he had a milkshake from [employer].  He denies any alcohol abuse and states that this feels similar to his prior episodes of pancreatitis.  He has had very little p.o. intake in the last few days.  He denies any fevers, chills, chest pain, shortness of breath.  No diarrhea noted or hematemesis.   ED Course: Vital signs stable and patient afebrile.  Leukocytosis of 11,000 noted and creatinine 1.8 with baseline 1.5-1.6.  CT compatible with acute pancreatitis with pancreatic cyst noted.  Lipase elevated at 752.  Review of Systems: Reviewed as noted above, otherwise negative.  Past Medical History:  Diagnosis Date   Acute alcoholic pancreatitis    Arthritis    Chronic pain    Depression    Diabetes mellitus without complication (HCC)    Essential hypertension, benign    GERD (gastroesophageal reflux disease)    Headache    HTN (hypertension) 11/27/2015   Hyperlipidemia    Sleep apnea    cpap    Stroke ([place])    2016    Past Surgical History:  Procedure Laterality Date   CLOSED REDUCTION MANDIBULAR FRACTURE W/ ARCH BARS     + multiple extractions   Condyloma resection     MULTIPLE EXTRACTIONS WITH ALVEOLOPLASTY Bilateral 01/23/2018   Procedure: DENTAL EXTRACTION OF TEETH NUMBER ONE, TWO, THREE, FOUR, FIVE, SIX, SEVEN, EIGHT, NINE, TEN, ELEVEN, TWELVE, THIRTEEN, FOURTEEN, FIFTEEN, SIXTEEN, SEVENTEEN, TWENTY, TWENTY-ONE, TWENTY-TWO, TWENTY-THREE, TWENTY-FOUR,  TWENTY-FIVE, TWENTY-SIX, TWENTY-SEVEN, TWENTY-EIGHT, TWENTY-NINE, THIRTY-TWO WITH ALVEOLOPLASTY;  Surgeon: Diona Browner, DDS;  Location: [place];  Service: Oral Surgery;  Lat   RADIOLOGY WITH ANESTHESIA N/A 11/18/2015   Procedure: RADIOLOGY WITH ANESTHESIA;  Surgeon: Luanne Bras, MD;  Location: [place];  Service: Radiology;  Laterality: N/A;   Removal foreign body right shoulder     Right rotator cuff repair     TEE WITHOUT CARDIOVERSION N/A 11/21/2015   Procedure: TRANSESOPHAGEAL ECHOCARDIOGRAM (TEE);  Surgeon: Larey Dresser, MD;  Location: [place];  Service: Cardiovascular;  Laterality: N/A;   TOOTH EXTRACTION N/A 01/24/2018   Procedure: SUTURE ORAL WOUND;  Surgeon: Diona Browner, DDS;  Location: [place];  Service: Oral Surgery;  Laterality: N/A;     reports that he has been smoking cigarettes. He has a 15.00 pack-year smoking history. He has never used smokeless tobacco. He reports that he does not currently use alcohol. He reports current drug use. Drug: Marijuana.  Allergies  Allergen Reactions   Oxcarbazepine Other (See Comments)    Patient goes out of right state of mind.     Family History  Problem Relation Age of Onset   Diabetes Mother    Hypertension Mother    Drug abuse Mother    Hyperlipidemia Mother    Diabetes Father    Hypertension Father    Hyperlipidemia Father    Diabetes Brother    Hypertension Brother     Prior to Admission medications   Medication Sig Start Date End Date Taking? Authorizing Provider  amLODipine (NORVASC) 10 MG tablet Take 1 tablet (10 mg total) by mouth daily. 01/19/16   Kirsteins, [name], MD  aspirin 325 MG tablet Take 1 tablet (325 mg total) by mouth daily. 12/20/15   Love, Ivan Anchors, PA-C  cloNIDine (CATAPRES - DOSED IN MG/24 HR) 0.3 mg/24hr patch Place 1 patch (0.3 mg total) onto the skin once a week. Next change on monday Patient taking differently: Place 0.3 mg onto the skin every Wednesday. Next change on monday 12/20/15   Love,  Ivan Anchors, PA-C  diphenhydramine-acetaminophen (TYLENOL PM) 25-500 MG TABS tablet Take 1-2 tablets by mouth at bedtime as needed (for sleep). Depends on insomnia if takes 1-2 tablets    [provider]  empagliflozin (JARDIANCE) 25 MG TABS tablet Take 25 mg by mouth daily.     [provider]  escitalopram (LEXAPRO) 10 MG tablet Take 10 mg by mouth daily.    [provider]  fenofibrate (TRICOR) 145 MG tablet Take 145 mg by mouth daily. 04/10/22   [provider]  hydrOXYzine (ATARAX) 25 MG tablet Take 25 mg by mouth every 8 (eight) hours as needed for anxiety. 04/17/22   [provider]  labetalol (NORMODYNE) 300 MG tablet Take 300 mg by mouth 2 (two) times daily. 03/08/22   [provider]  metFORMIN (GLUCOPHAGE) 500 MG tablet Take 1 tablet (500 mg total) by mouth 2 (two) times daily with a meal. 03/18/17   Forde Dandy, MD  nicotine (NICODERM CQ - DOSED IN MG/24 HOURS) 14 mg/24hr patch Place 14 mg onto the skin daily. 05/10/22   [provider]  oxyCODONE (OXY IR/ROXICODONE) 5 MG immediate release tablet Take 5 mg by mouth every 8 (eight) hours. 06/28/22   [provider]  pantoprazole (PROTONIX) 40 MG tablet Take 1 tablet (40 mg total) by mouth at bedtime. 01/19/16   Kirsteins, [name], MD  traZODone (DESYREL) 100 MG tablet Take 1 tablet (100 mg total) by mouth at bedtime. 01/19/16   Kirsteins, [name], MD  triamcinolone cream (KENALOG) 0.1 % Apply 1 application. topically daily.    [provider]  VASCEPA 1 g capsule Take 2 g by mouth 2 (two) times daily. 03/12/22   [provider]    Physical Exam: Vitals:   07/19/22 0730 07/19/22 0731 07/19/22 0732 07/19/22 0930  BP:   (!) 167/109 (!) 148/87  Pulse: 95  99 98  Resp: (!) 22  (!) 21 (!) 24  Temp:  98.5 F (36.9 C)  98.6 F (37 C)  TempSrc:  Oral  Oral  SpO2: 97%  97% 97%    Constitutional: NAD, calm, comfortable Vitals:   07/19/22 0730 07/19/22 0731  07/19/22 0732 07/19/22 0930  BP:   (!) 167/109 (!) 148/87  Pulse: 95  99 98  Resp: (!) 22  (!) 21 (!) 24  Temp:  98.5 F (36.9 C)  98.6 F (37 C)  TempSrc:  Oral  Oral  SpO2: 97%  97% 97%   Eyes: lids and conjunctivae normal Neck: normal, supple Respiratory: clear to auscultation bilaterally. Normal respiratory effort. No accessory muscle use.  Cardiovascular: Regular rate and rhythm, no murmurs. Abdomen: no tenderness, no distention. Bowel sounds positive.  Musculoskeletal:  No edema. Skin: no rashes, lesions, ulcers.  Psychiatric: Flat affect  Labs on Admission: I have personally reviewed following labs and imaging studies  CBC: Recent Labs  Lab 07/19/22 0748  WBC 11.0*  NEUTROABS 8.5*  HGB 14.1  HCT 43.6  MCV 91.0  PLT 109   Basic Metabolic Panel: Recent Labs  Lab 07/19/22 0748  NA 139  K 4.3  CL 105  CO2 25  GLUCOSE 130*  BUN 19  CREATININE 1.80*  CALCIUM 9.0   GFR: Estimated Creatinine Clearance: 51.6 mL/min (A) (by C-G formula based on SCr of 1.8 mg/dL (H)). Liver Function Tests: Recent Labs  Lab 07/19/22 0748  AST 29  ALT 34  ALKPHOS 42  BILITOT 0.9  PROT 7.3  ALBUMIN 3.4*   Recent Labs  Lab 07/19/22 0748  LIPASE 752*   No results for input(s): "AMMONIA" in the last 168 hours. Coagulation Profile: Recent Labs  Lab 07/19/22 0748  INR 1.2   Cardiac Enzymes: No results for input(s): "CKTOTAL", "CKMB", "CKMBINDEX", "TROPONINI" in the last 168 hours. BNP (last 3 results) No results for input(s): "PROBNP" in the last 8760 hours. HbA1C: No results for input(s): "HGBA1C" in the last 72 hours. CBG: No results for input(s): "GLUCAP" in the last 168 hours. Lipid Profile: No results for input(s): "CHOL", "HDL", "LDLCALC", "TRIG", "CHOLHDL", "LDLDIRECT" in the last 72 hours. Thyroid Function Tests: No results for input(s): "TSH", "T4TOTAL", "FREET4", "T3FREE", "THYROIDAB" in the last 72 hours. Anemia Panel: No results for input(s):  "VITAMINB12", "FOLATE", "FERRITIN", "TIBC", "IRON", "RETICCTPCT" in the last 72 hours. Urine analysis:    Component Value Date/Time   COLORURINE YELLOW 06/14/2022 1504   APPEARANCEUR CLEAR 06/14/2022 1504   LABSPEC 1.006 06/14/2022 1504   PHURINE 6.0 06/14/2022 1504   GLUCOSEU >=500 (A) 06/14/2022 1504   HGBUR NEGATIVE 06/14/2022 1504   BILIRUBINUR NEGATIVE 06/14/2022 1504   KETONESUR NEGATIVE 06/14/2022 1504   PROTEINUR 100 (A) 06/14/2022 1504   UROBILINOGEN 0.2 09/22/2015 0750   NITRITE NEGATIVE 06/14/2022 1504   LEUKOCYTESUR NEGATIVE 06/14/2022 1504    Radiological Exams on Admission: CT Abdomen Pelvis W Contrast  Result Date: 07/19/2022 CLINICAL DATA:  Follow-up pancreatitis. Abdominal pain with nausea and vomiting. EXAM: CT ABDOMEN AND PELVIS WITH CONTRAST TECHNIQUE: Multidetector CT imaging of the abdomen and pelvis was performed using the standard protocol following bolus administration of intravenous contrast. RADIATION DOSE REDUCTION: This exam was performed according to the departmental dose-optimization program which includes automated exposure control, adjustment of the mA and/or kV according to patient size and/or use of iterative reconstruction technique. CONTRAST:  181m OMNIPAQUE IOHEXOL 300 MG/ML  SOLN COMPARISON:  06/14/2022, 06/04/2022 and 04/24/2022. FINDINGS: Lower chest: Lower lobe opacity, similar to the prior study, consistent with atelectasis. Heart mildly enlarged. Hepatobiliary: No focal liver abnormality is seen. No gallstones, gallbladder wall thickening, or biliary dilatation. Pancreas: Pancreatic cystic masses, largest in the pancreatic head and uncinate process, 2.9 x 1.6 x 2.5 cm. Another lies in the pancreatic body anteriorly and superiorly, 1.3 cm. Both of these are without significant change from the most recent prior exam. Mild irregular pancreatic duct dilation, also stable. Peripancreatic inflammation has increased from the prior study, most evident  superior to the pancreas and along the gastrohepatic ligament. Normal pancreatic enhancement. Portal vein, superior mesenteric vein and splenic vein are widely patent. Spleen: Normal in size without focal abnormality. Adrenals/Urinary Tract: No adrenal masses. Kidneys normal in size, orientation and position with symmetric enhancement and excretion. Stable 1.2 cm anterior midpole right renal cyst. No follow-up recommended. Small nonobstructing stone in the lower pole the right kidney. No hydronephrosis. Normal ureters. Normal bladder. Stomach/Bowel: Mild wall thickening of the distal stomach. This is presumed reactive to the adjacent pancreatic inflammation. Small bowel and colon are normal  in caliber. No wall thickening. No inflammation. Left colon diverticula. Normal appendix visualized. Vascular/Lymphatic: Aortic atherosclerosis. No aneurysm. Shotty 1.2 cm gastrohepatic ligament lymph node, similar to the prior CT. No other enlarged lymph nodes. Reproductive: Unremarkable. Other: Small amount of ascites, new compared to the prior CT. Musculoskeletal: No fracture or acute finding.  No bone lesion. IMPRESSION: 1. Interval worsening of pancreatitis. There has been an increase in peripancreatic inflammatory changes as well as the development of a small amount of ascites. 2. No evidence of pancreatic necrosis.  No venous thrombosis. 3. Cystic masses in the pancreatic head and body are stable consistent with acute pancreatic fluid collections. Electronically Signed   By: Lajean Manes M.D.   On: 07/19/2022 09:50   DG Chest Port 1 View  Result Date: 07/19/2022 CLINICAL DATA:  07/19/2022 EXAM: PORTABLE CHEST 1 VIEW COMPARISON:  03/22/2018 FINDINGS: Low lung volumes with streaky basilar opacities favored to represent atelectasis over developing pneumonia. Prominent heart size. Negative for edema, effusion or pneumothorax. Trachea midline. Aorta atherosclerotic. Degenerative changes of the spine. IMPRESSION: Low volume  exam with basilar atelectasis. Electronically Signed   By: Jerilynn Mages.  Shick M.D.   On: 07/19/2022 08:06    EKG: Independently reviewed. SR 95bpm.  Assessment/Plan Principal Problem:   Acute pancreatitis Active Problems:   Diabetes mellitus ([place])   Accelerated hypertension   Depression   Anemia   GERD (gastroesophageal reflux disease)    Recurrent acute pancreatitis -Noted to be cystic with pancreatic fluid collections at the head of pancreas -Previously related to alcohol use, but patient states that he has been off alcohol for the last 5 months -Possibly related to recent high-fat intake with milkshake noted -Clear liquid diet and advance as tolerated -Zofran for nausea and vomiting -IV fluid -Pain management as needed  Mild AKI -Likely prerenal related to above with poor oral intake -New IV fluid and monitor repeat CBC  GERD -Continue PPI  Chronic constipation -MiraLAX as needed  Hypertension -Continue home medications and monitor blood pressure  Dyslipidemia -Continue fenofibrate -Check blood panel  Type 2 diabetes -Maintain on SSI for now  Right renal cyst -Appears stable with no further follow-up recommended  Tobacco abuse -Counseled on cessation   DVT prophylaxis: Heparin Code Status: Full Family Communication: None at bedside Disposition Plan:Admit for pancreatitis Consults called:None Admission status: Obs, MedSurg  Severity of Illness: The appropriate patient status for this patient is OBSERVATION. Observation status is judged to be reasonable and necessary in order to provide the required intensity of service to ensure the patient's safety. The patient's presenting symptoms, physical exam findings, and initial radiographic and laboratory data in the context of their medical condition is felt to place them at decreased risk for further clinical deterioration. Furthermore, it is anticipated that the patient will be medically stable for discharge from the  hospital within 2 midnights of admission.    Paitynn Mikus D Manuella Ghazi DO [name]  If 7PM-7AM, please contact night-coverage [email or web address]  07/19/2022, 11:11 AM

## 2022-07-19 NOTE — ED Triage Notes (Signed)
Patient complaining of abdominal pain with N/V for the past four days. States that he knows it is pancreatitis.

## 2022-07-19 NOTE — ED Provider Notes (Signed)
[facility] EMERGENCY DEPARTMENT Provider Note   CSN: [id number] Arrival date & time: 07/19/22  1601     History  Chief Complaint  Patient presents with   Abdominal Pain    Taylor Obrien is a 56 y.o. male.  He is here with a complaint of generalized abdominal pain that started 3 days ago.  Rates it as 10 out of 10.  Associated with nausea vomiting.  No diarrhea.  No blood in the vomitus.  Feels short of breath because of the pain.  No fevers or chills.  Has tried nothing for it.  States it is his pancreas although he denies any alcohol or drugs.  He is not sure who he sees for his pancreatitis.  Did not take his morning medications today.  The history is provided by the patient.  Abdominal Pain Pain location:  Generalized Pain severity:  Severe Onset quality:  Gradual Duration:  3 days Timing:  Constant Progression:  Unchanged Chronicity:  Recurrent Context: not alcohol use and not trauma   Relieved by:  None tried Worsened by:  Nothing Ineffective treatments:  None tried Associated symptoms: nausea, shortness of breath and vomiting   Associated symptoms: no chest pain, no cough, no diarrhea, no dysuria, no fever and no hematemesis   Risk factors: recent hospitalization        Home Medications Prior to Admission medications   Medication Sig Start Date End Date Taking? Authorizing Provider  amLODipine (NORVASC) 10 MG tablet Take 1 tablet (10 mg total) by mouth daily. 01/19/16   Kirsteins, [name], MD  aspirin 325 MG tablet Take 1 tablet (325 mg total) by mouth daily. 12/20/15   Love, Ivan Anchors, PA-C  cloNIDine (CATAPRES - DOSED IN MG/24 HR) 0.3 mg/24hr patch Place 1 patch (0.3 mg total) onto the skin once a week. Next change on monday Patient taking differently: Place 0.3 mg onto the skin every Wednesday. Next change on monday 12/20/15   Love, Ivan Anchors, PA-C  diphenhydramine-acetaminophen (TYLENOL PM) 25-500 MG TABS tablet Take 1-2 tablets by mouth at bedtime as needed (for  sleep). Depends on insomnia if takes 1-2 tablets    [provider]  empagliflozin (JARDIANCE) 25 MG TABS tablet Take 25 mg by mouth daily.     [provider]  escitalopram (LEXAPRO) 10 MG tablet Take 10 mg by mouth daily.    [provider]  fenofibrate (TRICOR) 145 MG tablet Take 145 mg by mouth daily. 04/10/22   [provider]  hydrOXYzine (ATARAX) 25 MG tablet Take 25 mg by mouth every 8 (eight) hours as needed for anxiety. 04/17/22   [provider]  labetalol (NORMODYNE) 300 MG tablet Take 300 mg by mouth 2 (two) times daily. 03/08/22   [provider]  metFORMIN (GLUCOPHAGE) 500 MG tablet Take 1 tablet (500 mg total) by mouth 2 (two) times daily with a meal. 03/18/17   Forde Dandy, MD  nicotine (NICODERM CQ - DOSED IN MG/24 HOURS) 14 mg/24hr patch Place 14 mg onto the skin daily. 05/10/22   [provider]  oxyCODONE (OXY IR/ROXICODONE) 5 MG immediate release tablet Take 5 mg by mouth every 8 (eight) hours. 06/28/22   [provider]  pantoprazole (PROTONIX) 40 MG tablet Take 1 tablet (40 mg total) by mouth at bedtime. 01/19/16   Kirsteins, [name], MD  traZODone (DESYREL) 100 MG tablet Take 1 tablet (100 mg total) by mouth at bedtime. 01/19/16   Kirsteins, [name], MD  triamcinolone cream (KENALOG) 0.1 % Apply 1 application. topically daily.    [provider]  VASCEPA 1 g capsule Take 2 g by mouth 2 (two) times daily. 03/12/22   [provider]      Allergies    Oxcarbazepine    Review of Systems   Review of Systems  Constitutional:  Negative for fever.  Respiratory:  Positive for shortness of breath. Negative for cough.   Cardiovascular:  Negative for chest pain.  Gastrointestinal:  Positive for abdominal pain, nausea and vomiting. Negative for diarrhea and hematemesis.  Genitourinary:  Negative for dysuria.    Physical Exam Updated Vital Signs BP (!) 167/109   Pulse 99   Temp 98.5 F (36.9  C) (Oral)   Resp (!) 21   SpO2 97%  Physical Exam Vitals and nursing note reviewed.  Constitutional:      General: He is not in acute distress.    Appearance: Normal appearance. He is well-developed.  HENT:     Head: Normocephalic and atraumatic.  Eyes:     Conjunctiva/sclera: Conjunctivae normal.  Cardiovascular:     Rate and Rhythm: Normal rate and regular rhythm.     Heart sounds: No murmur heard. Pulmonary:     Effort: Pulmonary effort is normal. No respiratory distress.     Breath sounds: Normal breath sounds.  Abdominal:     Palpations: Abdomen is soft.     Tenderness: There is generalized abdominal tenderness. There is no guarding or rebound.  Musculoskeletal:     Cervical back: Neck supple.     Right lower leg: No edema.     Left lower leg: No edema.  Skin:    General: Skin is warm and dry.     Capillary Refill: Capillary refill takes less than 2 seconds.  Neurological:     Mental Status: He is alert. Mental status is at baseline.     ED Results / Procedures / Treatments   Labs (all labs ordered are listed, but only abnormal results are displayed) Labs Reviewed  COMPREHENSIVE METABOLIC PANEL - Abnormal; Notable for the following components:      Result Value   Glucose, Bld 130 (*)    Creatinine, Ser 1.80 (*)    Albumin 3.4 (*)    GFR, Estimated 44 (*)    All other components within normal limits  LIPASE, BLOOD - Abnormal; Notable for the following components:   Lipase 752 (*)    All other components within normal limits  CBC WITH DIFFERENTIAL/PLATELET - Abnormal; Notable for the following components:   WBC 11.0 (*)    Neutro Abs 8.5 (*)    Monocytes Absolute 1.2 (*)    All other components within normal limits  LIPID PANEL - Abnormal; Notable for the following components:   Triglycerides 217 (*)    HDL 11 (*)    VLDL 43 (*)    All other components within normal limits  ETHANOL  PROTIME-INR  MAGNESIUM  COMPREHENSIVE METABOLIC PANEL  CBC     EKG EKG Interpretation  Date/Time:  Friday July 19 2022 07:30:35 EDT Ventricular Rate:  95 PR Interval:  162 QRS Duration: 101 QT Interval:  365 QTC Calculation: 459 R Axis:   -36 Text Interpretation: Sinus rhythm Ventricular premature complex LAE, consider biatrial enlargement LVH with secondary repolarization abnormality Inferior infarct, old Anterolateral infarct, age indeterminate No significant change since prior 5/23 Confirmed by Aletta Edouard [phone]) on 07/19/2022 7:52:56 AM  Radiology CT Abdomen Pelvis W Contrast  Result Date: 07/19/2022 CLINICAL DATA:  Follow-up pancreatitis. Abdominal pain with nausea and vomiting. EXAM: CT ABDOMEN AND PELVIS WITH CONTRAST TECHNIQUE: Multidetector CT imaging of the abdomen and pelvis was performed using the standard protocol following bolus administration of intravenous contrast. RADIATION DOSE REDUCTION: This exam was performed according to the departmental dose-optimization program which includes automated exposure control, adjustment of the mA and/or kV according to patient size and/or use of iterative reconstruction technique. CONTRAST:  150m OMNIPAQUE IOHEXOL 300 MG/ML  SOLN COMPARISON:  06/14/2022, 06/04/2022 and 04/24/2022. FINDINGS: Lower chest: Lower lobe opacity, similar to the prior study, consistent with atelectasis. Heart mildly enlarged. Hepatobiliary: No focal liver abnormality is seen. No gallstones, gallbladder wall thickening, or biliary dilatation. Pancreas: Pancreatic cystic masses, largest in the pancreatic head and uncinate process, 2.9 x 1.6 x 2.5 cm. Another lies in the pancreatic body anteriorly and superiorly, 1.3 cm. Both of these are without significant change from the most recent prior exam. Mild irregular pancreatic duct dilation, also stable. Peripancreatic inflammation has increased from the prior study, most evident superior to the pancreas and along the gastrohepatic ligament. Normal pancreatic enhancement. Portal  vein, superior mesenteric vein and splenic vein are widely patent. Spleen: Normal in size without focal abnormality. Adrenals/Urinary Tract: No adrenal masses. Kidneys normal in size, orientation and position with symmetric enhancement and excretion. Stable 1.2 cm anterior midpole right renal cyst. No follow-up recommended. Small nonobstructing stone in the lower pole the right kidney. No hydronephrosis. Normal ureters. Normal bladder. Stomach/Bowel: Mild wall thickening of the distal stomach. This is presumed reactive to the adjacent pancreatic inflammation. Small bowel and colon are normal in caliber. No wall thickening. No inflammation. Left colon diverticula. Normal appendix visualized. Vascular/Lymphatic: Aortic atherosclerosis. No aneurysm. Shotty 1.2 cm gastrohepatic ligament lymph node, similar to the prior CT. No other enlarged lymph nodes. Reproductive: Unremarkable. Other: Small amount of ascites, new compared to the prior CT. Musculoskeletal: No fracture or acute finding.  No bone lesion. IMPRESSION: 1. Interval worsening of pancreatitis. There has been an increase in peripancreatic inflammatory changes as well as the development of a small amount of ascites. 2. No evidence of pancreatic necrosis.  No venous thrombosis. 3. Cystic masses in the pancreatic head and body are stable consistent with acute pancreatic fluid collections. Electronically Signed   By: DLajean ManesM.D.   On: 07/19/2022 09:50   DG Chest Port 1 View  Result Date: 07/19/2022 CLINICAL DATA:  07/19/2022 EXAM: PORTABLE CHEST 1 VIEW COMPARISON:  03/22/2018 FINDINGS: Low lung volumes with streaky basilar opacities favored to represent atelectasis over developing pneumonia. Prominent heart size. Negative for edema, effusion or pneumothorax. Trachea midline. Aorta atherosclerotic. Degenerative changes of the spine. IMPRESSION: Low volume exam with basilar atelectasis. Electronically Signed   By: MJerilynn Mages  Shick M.D.   On: 07/19/2022 08:06     Procedures Procedures    Medications Ordered in ED Medications  sodium chloride 0.9 % bolus 1,000 mL (has no administration in time range)  ondansetron (ZOFRAN) injection 4 mg (has no administration in time range)  morphine (PF) 4 MG/ML injection 4 mg (has no administration in time range)    ED Course/ Medical Decision Making/ A&P Clinical Course as of 07/19/22 1754  Fri Jul 19, 2022  0757 Chest x-ray with poor inspiration possible atelectasis left base.  Awaiting radiology reading. [MB]    Clinical Course User Index [MB] BHayden Rasmussen MD  Medical Decision Making Amount and/or Complexity of Data Reviewed Labs: ordered. Radiology: ordered.  Risk Prescription drug management. Decision regarding hospitalization.  This patient complains of generalized abdominal pain poor appetite nausea vomiting; this involves an extensive number of treatment Options and is a complaint that carries with it a high risk of complications and morbidity. The differential includes pancreatitis, colitis, obstruction, diverticulitis, gastritis, peptic ulcer disease  I ordered, reviewed and interpreted labs, which included CBC with mildly elevated white count normal hemoglobin, chemistries with elevation of glucose and creatinine, LFTs fairly unremarkable, lipase markedly elevated, INR normal I ordered medication IV fluids nausea medication pain medication PPI and reviewed PMP when indicated. I ordered imaging studies which included chest x-ray and CT abdomen and pelvis and I independently    visualized and interpreted imaging which showed pancreatic stranding  Previous records obtained and reviewed in epic including prior discharge summaries GI note I consulted Dr. Manuella Ghazi [facility] and discussed lab and imaging findings and discussed disposition.  Cardiac monitoring reviewed, normal sinus rhythm Social determinants considered, no significant barriers other than  tobacco use Critical Interventions: None  After the interventions stated above, I reevaluated the patient and found patient to be well-appearing and hemodynamically stable Admission and further testing considered, patient will be admitted to the hospital for further management of his acute pancreatitis.  Patient in agreement with plan          Final Clinical Impression(s) / ED Diagnoses Final diagnoses:  Acute pancreatitis, unspecified complication status, unspecified pancreatitis type    Rx / DC Orders ED Discharge Orders     None         Hayden Rasmussen, MD 07/19/22 1757

## 2022-07-20 DIAGNOSIS — K859 Acute pancreatitis without necrosis or infection, unspecified: Secondary | ICD-10-CM | POA: Diagnosis not present

## 2022-07-20 LAB — COMPREHENSIVE METABOLIC PANEL
ALT: 22 U/L (ref 0–44)
AST: 19 U/L (ref 15–41)
Albumin: 2.6 g/dL — ABNORMAL LOW (ref 3.5–5.0)
Alkaline Phosphatase: 31 U/L — ABNORMAL LOW (ref 38–126)
Anion gap: 6 (ref 5–15)
BUN: 18 mg/dL (ref 6–20)
CO2: 25 mmol/L (ref 22–32)
Calcium: 8.5 mg/dL — ABNORMAL LOW (ref 8.9–10.3)
Chloride: 109 mmol/L (ref 98–111)
Creatinine, Ser: 1.49 mg/dL — ABNORMAL HIGH (ref 0.61–1.24)
GFR, Estimated: 56 mL/min — ABNORMAL LOW (ref >60)
Glucose, Bld: 94 mg/dL (ref 70–99)
Potassium: 4.2 mmol/L (ref 3.5–5.1)
Sodium: 140 mmol/L (ref 135–145)
Total Bilirubin: 0.9 mg/dL (ref 0.3–1.2)
Total Protein: 5.7 g/dL — ABNORMAL LOW (ref 6.5–8.1)

## 2022-07-20 LAB — CBC
HCT: 35.6 % — ABNORMAL LOW (ref 39.0–52.0)
Hemoglobin: 11.3 g/dL — ABNORMAL LOW (ref 13.0–17.0)
MCH: 29.3 pg (ref 26.0–34.0)
MCHC: 31.7 g/dL (ref 30.0–36.0)
MCV: 92.2 fL (ref 80.0–100.0)
Platelets: 296 10*3/uL (ref 150–400)
RBC: 3.86 MIL/uL — ABNORMAL LOW (ref 4.22–5.81)
RDW: 15.3 % (ref 11.5–15.5)
WBC: 8.9 10*3/uL (ref 4.0–10.5)
nRBC: 0 % (ref 0.0–0.2)

## 2022-07-20 LAB — MAGNESIUM: Magnesium: 1.7 mg/dL (ref 1.7–2.4)

## 2022-07-20 MED ORDER — ONDANSETRON HCL 4 MG PO TABS: 4.0000 mg | ORAL_TABLET | Freq: Four times a day (QID) | ORAL | 0 refills | Status: DC | PRN

## 2022-07-20 NOTE — Discharge Summary (Signed)
Physician Discharge Summary  Taylor Obrien STM:[id number] DOB: 03-21-1966 DOA: 07/19/2022  PCP: Darrol Jump, NP  Admit date: 07/19/2022  Discharge date: 07/20/2022  Admitted From:Home  Disposition:  Home  Recommendations for Outpatient Follow-up:  Follow up with PCP in 1-2 weeks Continue home medications as prior and avoid fatty food intake and continue to refrain from alcohol Zofran provided as needed for nausea or vomiting  Home Health: None  Equipment/Devices: None  Discharge Condition:Stable  CODE STATUS: Full  Diet recommendation: Heart Healthy/carb modified/low-fat  Brief/Interim Summary: Taylor Obrien is a 56 y.o. male with medical history significant for recurrent pancreatitis, prior alcohol abuse, ongoing tobacco abuse, hypertension, type 2 diabetes, dyslipidemia, and GERD who presented to the ED with complaints of nausea and vomiting along with epigastric abdominal pain radiating to the back that started approximately 3-4 days ago after he had a milkshake from [employer].  He was admitted with findings of acute pancreatitis as well as mild AKI.  Both have improved after IV fluid administration and he is now tolerating diet with no nausea or vomiting noted.  He denies any further abdominal pain.  No acute overnight events noted and he is stable for discharge.  He has been counseled on avoiding fatty food intake such as milkshakes.  Discharge Diagnoses:  Principal Problem:   Acute pancreatitis Active Problems:   Diabetes mellitus ([place])   Accelerated hypertension   Depression   Anemia   GERD (gastroesophageal reflux disease)  Principal discharge diagnosis: Recurrent acute pancreatitis along with mild AKI on CKD IIIa due to poor oral intake and GI losses.  Discharge Instructions  Discharge Instructions     Diet - low sodium heart healthy   Complete by: As directed    Increase activity slowly   Complete by: As directed       Allergies as of 07/20/2022        Reactions   Oxcarbazepine Other (See Comments)   Patient goes out of right state of mind.         Medication List     TAKE these medications    amLODipine 10 MG tablet Commonly known as: NORVASC Take 1 tablet (10 mg total) by mouth daily.   aspirin 325 MG tablet Take 1 tablet (325 mg total) by mouth daily.   cloNIDine 0.3 mg/24hr patch Commonly known as: CATAPRES - Dosed in mg/24 hr Place 1 patch (0.3 mg total) onto the skin once a week. Next change on monday What changed: when to take this   diphenhydramine-acetaminophen 25-500 MG Tabs tablet Commonly known as: TYLENOL PM Take 1-2 tablets by mouth at bedtime as needed (for sleep). Depends on insomnia if takes 1-2 tablets   empagliflozin 25 MG Tabs tablet Commonly known as: JARDIANCE Take 25 mg by mouth daily.   escitalopram 10 MG tablet Commonly known as: LEXAPRO Take 10 mg by mouth daily.   fenofibrate 145 MG tablet Commonly known as: TRICOR Take 145 mg by mouth daily.   hydrOXYzine 25 MG tablet Commonly known as: ATARAX Take 25 mg by mouth every 8 (eight) hours as needed for anxiety.   labetalol 300 MG tablet Commonly known as: NORMODYNE Take 300 mg by mouth 2 (two) times daily.   lisinopril 10 MG tablet Commonly known as: ZESTRIL Take 10 mg by mouth daily.   metFORMIN 500 MG tablet Commonly known as: GLUCOPHAGE Take 1 tablet (500 mg total) by mouth 2 (two) times daily with a meal.   nicotine 14 mg/24hr patch Commonly  known as: NICODERM CQ - dosed in mg/24 hours Place 14 mg onto the skin daily.   ondansetron 4 MG tablet Commonly known as: ZOFRAN Take 1 tablet (4 mg total) by mouth every 6 (six) hours as needed for nausea.   oxyCODONE 5 MG immediate release tablet Commonly known as: Oxy IR/ROXICODONE Take 5 mg by mouth every 8 (eight) hours.   pantoprazole 40 MG tablet Commonly known as: PROTONIX Take 1 tablet (40 mg total) by mouth at bedtime.   traZODone 100 MG tablet Commonly known as:  DESYREL Take 1 tablet (100 mg total) by mouth at bedtime.   triamcinolone cream 0.1 % Commonly known as: KENALOG Apply 1 application. topically daily.   Vascepa 1 g capsule Generic drug: icosapent Ethyl Take 2 g by mouth 2 (two) times daily.        Follow-up Information     Darrol Jump, NP. Schedule an appointment as soon as possible for a visit in 1 week(s).   Specialty: Nurse Practitioner Contact information: [address] [place] [address] [phone]                Allergies  Allergen Reactions   Oxcarbazepine Other (See Comments)    Patient goes out of right state of mind.     Consultations: None   Procedures/Studies: CT Abdomen Pelvis W Contrast  Result Date: 07/19/2022 CLINICAL DATA:  Follow-up pancreatitis. Abdominal pain with nausea and vomiting. EXAM: CT ABDOMEN AND PELVIS WITH CONTRAST TECHNIQUE: Multidetector CT imaging of the abdomen and pelvis was performed using the standard protocol following bolus administration of intravenous contrast. RADIATION DOSE REDUCTION: This exam was performed according to the departmental dose-optimization program which includes automated exposure control, adjustment of the mA and/or kV according to patient size and/or use of iterative reconstruction technique. CONTRAST:  140m OMNIPAQUE IOHEXOL 300 MG/ML  SOLN COMPARISON:  06/14/2022, 06/04/2022 and 04/24/2022. FINDINGS: Lower chest: Lower lobe opacity, similar to the prior study, consistent with atelectasis. Heart mildly enlarged. Hepatobiliary: No focal liver abnormality is seen. No gallstones, gallbladder wall thickening, or biliary dilatation. Pancreas: Pancreatic cystic masses, largest in the pancreatic head and uncinate process, 2.9 x 1.6 x 2.5 cm. Another lies in the pancreatic body anteriorly and superiorly, 1.3 cm. Both of these are without significant change from the most recent prior exam. Mild irregular pancreatic duct dilation, also stable.  Peripancreatic inflammation has increased from the prior study, most evident superior to the pancreas and along the gastrohepatic ligament. Normal pancreatic enhancement. Portal vein, superior mesenteric vein and splenic vein are widely patent. Spleen: Normal in size without focal abnormality. Adrenals/Urinary Tract: No adrenal masses. Kidneys normal in size, orientation and position with symmetric enhancement and excretion. Stable 1.2 cm anterior midpole right renal cyst. No follow-up recommended. Small nonobstructing stone in the lower pole the right kidney. No hydronephrosis. Normal ureters. Normal bladder. Stomach/Bowel: Mild wall thickening of the distal stomach. This is presumed reactive to the adjacent pancreatic inflammation. Small bowel and colon are normal in caliber. No wall thickening. No inflammation. Left colon diverticula. Normal appendix visualized. Vascular/Lymphatic: Aortic atherosclerosis. No aneurysm. Shotty 1.2 cm gastrohepatic ligament lymph node, similar to the prior CT. No other enlarged lymph nodes. Reproductive: Unremarkable. Other: Small amount of ascites, new compared to the prior CT. Musculoskeletal: No fracture or acute finding.  No bone lesion. IMPRESSION: 1. Interval worsening of pancreatitis. There has been an increase in peripancreatic inflammatory changes as well as the development of a small amount of ascites. 2. No  evidence of pancreatic necrosis.  No venous thrombosis. 3. Cystic masses in the pancreatic head and body are stable consistent with acute pancreatic fluid collections. Electronically Signed   By: Lajean Manes M.D.   On: 07/19/2022 09:50   DG Chest Port 1 View  Result Date: 07/19/2022 CLINICAL DATA:  07/19/2022 EXAM: PORTABLE CHEST 1 VIEW COMPARISON:  03/22/2018 FINDINGS: Low lung volumes with streaky basilar opacities favored to represent atelectasis over developing pneumonia. Prominent heart size. Negative for edema, effusion or pneumothorax. Trachea midline.  Aorta atherosclerotic. Degenerative changes of the spine. IMPRESSION: Low volume exam with basilar atelectasis. Electronically Signed   By: Jerilynn Mages.  Shick M.D.   On: 07/19/2022 08:06     Discharge Exam: Vitals:   07/19/22 2120 07/20/22 0129  BP: (!) 139/90 119/87  Pulse: 99 87  Resp: 20 16  Temp: 98.9 F (37.2 C) 99 F (37.2 C)  SpO2: 99% 97%   Vitals:   07/19/22 1700 07/19/22 1800 07/19/22 2120 07/20/22 0129  BP: (!) 146/99 (!) 145/94 (!) 139/90 119/87  Pulse: 81 88 99 87  Resp: '18 19 20 16  '$ Temp:  98.9 F (37.2 C) 98.9 F (37.2 C) 99 F (37.2 C)  TempSrc:      SpO2:  95% 99% 97%    General: Pt is alert, awake, not in acute distress Cardiovascular: RRR, S1/S2 +, no rubs, no gallops Respiratory: CTA bilaterally, no wheezing, no rhonchi Abdominal: Soft, NT, ND, bowel sounds + Extremities: no edema, no cyanosis    The results of significant diagnostics from this hospitalization (including imaging, microbiology, ancillary and laboratory) are listed below for reference.     Microbiology: No results found for this or any previous visit (from the past 240 hour(s)).   Labs: BNP (last 3 results) No results for input(s): "BNP" in the last 8760 hours. Basic Metabolic Panel: Recent Labs  Lab 07/19/22 0748 07/20/22 0611  NA 139 140  K 4.3 4.2  CL 105 109  CO2 25 25  GLUCOSE 130* 94  BUN 19 18  CREATININE 1.80* 1.49*  CALCIUM 9.0 8.5*  MG  --  1.7   Liver Function Tests: Recent Labs  Lab 07/19/22 0748 07/20/22 0611  AST 29 19  ALT 34 22  ALKPHOS 42 31*  BILITOT 0.9 0.9  PROT 7.3 5.7*  ALBUMIN 3.4* 2.6*   Recent Labs  Lab 07/19/22 0748  LIPASE 752*   No results for input(s): "AMMONIA" in the last 168 hours. CBC: Recent Labs  Lab 07/19/22 0748 07/20/22 0611  WBC 11.0* 8.9  NEUTROABS 8.5*  --   HGB 14.1 11.3*  HCT 43.6 35.6*  MCV 91.0 92.2  PLT 363 296   Cardiac Enzymes: No results for input(s): "CKTOTAL", "CKMB", "CKMBINDEX", "TROPONINI" in the  last 168 hours. BNP: Invalid input(s): "POCBNP" CBG: No results for input(s): "GLUCAP" in the last 168 hours. D-Dimer No results for input(s): "DDIMER" in the last 72 hours. Hgb A1c No results for input(s): "HGBA1C" in the last 72 hours. Lipid Profile Recent Labs    07/19/22 0759  CHOL 152  HDL 11*  LDLCALC 98  TRIG 217*  CHOLHDL 13.8   Thyroid function studies No results for input(s): "TSH", "T4TOTAL", "T3FREE", "THYROIDAB" in the last 72 hours.  Invalid input(s): "FREET3" Anemia work up No results for input(s): "VITAMINB12", "FOLATE", "FERRITIN", "TIBC", "IRON", "RETICCTPCT" in the last 72 hours. Urinalysis    Component Value Date/Time   COLORURINE YELLOW 06/14/2022 [place] 06/14/2022 1504  LABSPEC 1.006 06/14/2022 1504   PHURINE 6.0 06/14/2022 1504   GLUCOSEU >=500 (A) 06/14/2022 1504   HGBUR NEGATIVE 06/14/2022 1504   BILIRUBINUR NEGATIVE 06/14/2022 1504   KETONESUR NEGATIVE 06/14/2022 1504   PROTEINUR 100 (A) 06/14/2022 1504   UROBILINOGEN 0.2 09/22/2015 0750   NITRITE NEGATIVE 06/14/2022 1504   LEUKOCYTESUR NEGATIVE 06/14/2022 1504   Sepsis Labs Recent Labs  Lab 07/19/22 0748 07/20/22 0611  WBC 11.0* 8.9   Microbiology No results found for this or any previous visit (from the past 240 hour(s)).   Time coordinating discharge: 35 minutes  SIGNED:   Rodena Goldmann, DO [name] 07/20/2022, 9:28 AM  If 7PM-7AM, please contact night-coverage [email or web address]

## 2022-07-20 NOTE — Progress Notes (Signed)
Nursing Progress Note  Patient d/c home. IV removed. Patient wheelchair sent with patient. Alert, oriented. Stable vitals. No further questions or concerns.   Vitals:   07/19/22 2120 07/20/22 0129  BP: (!) 139/90 119/87  Pulse: 99 87  Resp: 20 16  Temp: 98.9 F (37.2 C) 99 F (37.2 C)  SpO2: 99% 97%    Alfonse Alpers RN, BSN 07/20/2022 9:58 AM

## 2022-07-20 NOTE — Progress Notes (Signed)
Patient very eager to leave.

## 2022-07-30 ENCOUNTER — Telehealth: Payer: Self-pay | Admitting: *Deleted

## 2022-07-30 ENCOUNTER — Encounter: Payer: Self-pay | Admitting: *Deleted

## 2022-07-30 NOTE — Telephone Encounter (Signed)
Received call from spouse needing to reschedule procedure for 8/28. Pt moved to 9/11 at 1:45pm. Aware will mail new instructions/pre-op appt.

## 2022-08-01 ENCOUNTER — Encounter (HOSPITAL_COMMUNITY): Payer: Medicare Other

## 2022-08-15 ENCOUNTER — Other Ambulatory Visit: Payer: Self-pay

## 2022-08-15 ENCOUNTER — Encounter (HOSPITAL_COMMUNITY)
Admission: RE | Admit: 2022-08-15 | Discharge: 2022-08-15 | Disposition: A | Discharge status: Home / Self Care | Payer: Medicare Other | Source: Ambulatory Visit | Attending: Internal Medicine | Admitting: Internal Medicine

## 2022-08-15 ENCOUNTER — Encounter (HOSPITAL_COMMUNITY): Payer: Self-pay

## 2022-08-15 NOTE — Pre-Procedure Instructions (Signed)
Pre-op phone call done with wife and caregiver, Chiquitia. She verbalizes understanding of all instructions.

## 2022-08-19 ENCOUNTER — Ambulatory Visit (HOSPITAL_BASED_OUTPATIENT_CLINIC_OR_DEPARTMENT_OTHER): Payer: Medicare Other | Admitting: Anesthesiology

## 2022-08-19 ENCOUNTER — Ambulatory Visit (HOSPITAL_COMMUNITY): Payer: Medicare Other | Admitting: Anesthesiology

## 2022-08-19 ENCOUNTER — Ambulatory Visit (HOSPITAL_COMMUNITY)
Admission: RE | Admit: 2022-08-19 | Discharge: 2022-08-19 | Disposition: A | Discharge status: Home / Self Care | Payer: Medicare Other | Source: Ambulatory Visit | Attending: Internal Medicine | Admitting: Internal Medicine

## 2022-08-19 ENCOUNTER — Encounter (HOSPITAL_COMMUNITY): Payer: Self-pay

## 2022-08-19 ENCOUNTER — Other Ambulatory Visit: Payer: Self-pay

## 2022-08-19 ENCOUNTER — Encounter (HOSPITAL_COMMUNITY)
Admission: RE | Disposition: A | Discharge status: Home / Self Care | Payer: Self-pay | Source: Ambulatory Visit | Attending: Internal Medicine

## 2022-08-19 DIAGNOSIS — E119 Type 2 diabetes mellitus without complications: Secondary | ICD-10-CM | POA: Insufficient documentation

## 2022-08-19 DIAGNOSIS — I10 Essential (primary) hypertension: Secondary | ICD-10-CM | POA: Insufficient documentation

## 2022-08-19 DIAGNOSIS — D124 Benign neoplasm of descending colon: Secondary | ICD-10-CM | POA: Diagnosis not present

## 2022-08-19 DIAGNOSIS — G4730 Sleep apnea, unspecified: Secondary | ICD-10-CM | POA: Diagnosis not present

## 2022-08-19 DIAGNOSIS — F17210 Nicotine dependence, cigarettes, uncomplicated: Secondary | ICD-10-CM | POA: Diagnosis not present

## 2022-08-19 DIAGNOSIS — K449 Diaphragmatic hernia without obstruction or gangrene: Secondary | ICD-10-CM | POA: Insufficient documentation

## 2022-08-19 DIAGNOSIS — K259 Gastric ulcer, unspecified as acute or chronic, without hemorrhage or perforation: Secondary | ICD-10-CM | POA: Diagnosis not present

## 2022-08-19 DIAGNOSIS — D123 Benign neoplasm of transverse colon: Secondary | ICD-10-CM | POA: Insufficient documentation

## 2022-08-19 DIAGNOSIS — K635 Polyp of colon: Secondary | ICD-10-CM | POA: Diagnosis not present

## 2022-08-19 DIAGNOSIS — Z79899 Other long term (current) drug therapy: Secondary | ICD-10-CM | POA: Insufficient documentation

## 2022-08-19 DIAGNOSIS — Z7984 Long term (current) use of oral hypoglycemic drugs: Secondary | ICD-10-CM | POA: Diagnosis not present

## 2022-08-19 DIAGNOSIS — K2970 Gastritis, unspecified, without bleeding: Secondary | ICD-10-CM | POA: Insufficient documentation

## 2022-08-19 DIAGNOSIS — D509 Iron deficiency anemia, unspecified: Secondary | ICD-10-CM

## 2022-08-19 DIAGNOSIS — Z8673 Personal history of transient ischemic attack (TIA), and cerebral infarction without residual deficits: Secondary | ICD-10-CM | POA: Diagnosis not present

## 2022-08-19 DIAGNOSIS — R933 Abnormal findings on diagnostic imaging of other parts of digestive tract: Secondary | ICD-10-CM | POA: Diagnosis present

## 2022-08-19 DIAGNOSIS — K5730 Diverticulosis of large intestine without perforation or abscess without bleeding: Secondary | ICD-10-CM | POA: Diagnosis not present

## 2022-08-19 DIAGNOSIS — K2950 Unspecified chronic gastritis without bleeding: Secondary | ICD-10-CM | POA: Diagnosis not present

## 2022-08-19 DIAGNOSIS — K219 Gastro-esophageal reflux disease without esophagitis: Secondary | ICD-10-CM | POA: Insufficient documentation

## 2022-08-19 DIAGNOSIS — M1990 Unspecified osteoarthritis, unspecified site: Secondary | ICD-10-CM | POA: Insufficient documentation

## 2022-08-19 DIAGNOSIS — F32A Depression, unspecified: Secondary | ICD-10-CM | POA: Diagnosis not present

## 2022-08-19 HISTORY — PX: ESOPHAGOGASTRODUODENOSCOPY (EGD) WITH PROPOFOL: SHX5813

## 2022-08-19 HISTORY — PX: COLONOSCOPY WITH PROPOFOL: SHX5780

## 2022-08-19 HISTORY — PX: POLYPECTOMY: SHX5525

## 2022-08-19 HISTORY — PX: BIOPSY: SHX5522

## 2022-08-19 LAB — GLUCOSE, CAPILLARY: Glucose-Capillary: 91 mg/dL (ref 70–99)

## 2022-08-19 SURGERY — COLONOSCOPY WITH PROPOFOL
Anesthesia: General

## 2022-08-19 MED ORDER — PROPOFOL 10 MG/ML IV BOLUS
INTRAVENOUS | Status: DC | PRN
  Administered 2022-08-19: 80 mg via INTRAVENOUS

## 2022-08-19 MED ORDER — PROPOFOL 500 MG/50ML IV EMUL
INTRAVENOUS | Status: DC | PRN
  Administered 2022-08-19: 150 ug/kg/min via INTRAVENOUS

## 2022-08-19 MED ORDER — LACTATED RINGERS IV SOLN: INTRAVENOUS | Status: DC

## 2022-08-19 MED ORDER — LIDOCAINE HCL (CARDIAC) PF 100 MG/5ML IV SOSY
PREFILLED_SYRINGE | INTRAVENOUS | Status: DC | PRN
  Administered 2022-08-19: 50 mg via INTRAVENOUS

## 2022-08-19 NOTE — Transfer of Care (Signed)
Immediate Anesthesia Transfer of Care Note  Patient: Taylor Obrien  Procedure(s) Performed: COLONOSCOPY WITH PROPOFOL ESOPHAGOGASTRODUODENOSCOPY (EGD) WITH PROPOFOL BIOPSY POLYPECTOMY  Patient Location: Short Stay  Anesthesia Type:General  Level of Consciousness: drowsy  Airway & Oxygen Therapy: Patient Spontanous Breathing  Post-op Assessment: Report given to RN and Post -op Vital signs reviewed and stable  Post vital signs: Reviewed and stable  Last Vitals:  Vitals Value Taken Time  BP 109/78 08/19/22 1341  Temp 36.4 C 08/19/22 1341  Pulse 77 08/19/22 1341  Resp 15 08/19/22 1341  SpO2 96 % 08/19/22 1341    Last Pain:  Vitals:   08/19/22 1341  TempSrc: Axillary  PainSc: 0-No pain      Patients Stated Pain Goal: 7 (14/70/92 9574)  Complications: No notable events documented.

## 2022-08-19 NOTE — Op Note (Signed)
[facility] Patient Name: Taylor Obrien Procedure Date: 08/19/2022 12:17 PM MRN: [id number] Date of Birth: [birth year] Attending MD: Elon Alas. Abbey Chatters DO CSN: [id number] Age: 56 Admit Type: Outpatient Procedure:                Upper GI endoscopy Indications:              Iron deficiency anemia, Heartburn, Abnormal CT of                            the GI tract Providers:                Elon Alas. Abbey Chatters, DO, Caprice Kluver, Janeece Riggers, RN,                            Everardo Pacific, [place] Risa Grill, Technician Referring MD:              Medicines:                See the Anesthesia note for documentation of the                            administered medications Complications:            No immediate complications. Estimated Blood Loss:     Estimated blood loss was minimal. Procedure:                Pre-Anesthesia Assessment:                           - The anesthesia plan was to use monitored                            anesthesia care (MAC).                           After obtaining informed consent, the endoscope was                            passed under direct vision. Throughout the                            procedure, the patient's blood pressure, pulse, and                            oxygen saturations were monitored continuously. The                            [id number]-H190 ([id number]) scope was introduced through the                            mouth, and advanced to the second part of duodenum.                            The upper GI endoscopy was accomplished without  difficulty. The patient tolerated the procedure                            well. Scope In: 1:17:48 PM Scope Out: 1:20:09 PM Total Procedure Duration: 0 hours 2 minutes 21 seconds  Findings:      The Z-line was regular and was found 38 cm from the incisors.      A small hiatal hernia was present.      Patchy moderate inflammation characterized by erosions and erythema was        found in the gastric body and in the gastric antrum. Biopsies were taken       with a cold forceps for Helicobacter pylori testing.      The duodenal bulb, first portion of the duodenum and second portion of       the duodenum were normal. Impression:               - Z-line regular, 38 cm from the incisors.                           - Small hiatal hernia.                           - Gastritis. Biopsied.                           - Normal duodenal bulb, first portion of the                            duodenum and second portion of the duodenum. Moderate Sedation:      Per Anesthesia Care Recommendation:           - Patient has a contact number available for                            emergencies. The signs and symptoms of potential                            delayed complications were discussed with the                            patient. Return to normal activities tomorrow.                            Written discharge instructions were provided to the                            patient.                           - Resume previous diet.                           - Continue present medications.                           - Await pathology results.                           -  Use Protonix (pantoprazole) 40 mg PO daily.                           - Return to GI office in 4 months. Procedure Code(s):        --- Professional ---                           [phone], Esophagogastroduodenoscopy, flexible,                            transoral; with biopsy, single or multiple Diagnosis Code(s):        --- Professional ---                           K44.9, Diaphragmatic hernia without obstruction or                            gangrene                           K29.70, Gastritis, unspecified, without bleeding                           D50.9, Iron deficiency anemia, unspecified                           R12, Heartburn                           R93.3, Abnormal findings on diagnostic imaging of                             other parts of digestive tract CPT copyright 2019 [employer]. All rights reserved. The codes documented in this report are preliminary and upon coder review may  be revised to meet current compliance requirements. Elon Alas. Abbey Chatters, DO [place] Abbey Chatters, DO 08/19/2022 1:23:42 PM This report has been signed electronically. Number of Addenda: 0

## 2022-08-19 NOTE — Anesthesia Preprocedure Evaluation (Signed)
Anesthesia Evaluation  Patient identified by MRN, date of birth, ID band Patient awake    Reviewed: Allergy & Precautions, NPO status , [name] Chart, lab work & pertinent test results, reviewed documented beta blocker date and time   History of Anesthesia Complications Negative for: history of anesthetic complications  Airway Mallampati: II  TM Distance: >3 FB Neck ROM: Full    Dental  (+) Edentulous Upper, Edentulous Lower   Pulmonary sleep apnea , Current SmokerPatient did not abstain from smoking.,    Pulmonary exam normal breath sounds clear to auscultation       Cardiovascular Exercise Tolerance: Poor hypertension, Pt. on medications and Pt. on home beta blockers Normal cardiovascular exam Rhythm:Regular Rate:Normal     Neuro/Psych  Headaches, PSYCHIATRIC DISORDERS Depression CVA, Residual Symptoms    GI/Hepatic GERD  Medicated and Controlled,(+)     substance abuse  alcohol use,   Endo/Other  diabetes, Well Controlled, Type 2, Oral Hypoglycemic Agents  Renal/GU Renal InsufficiencyRenal disease  negative genitourinary   Musculoskeletal  (+) Arthritis , Osteoarthritis,    Abdominal   Peds negative pediatric ROS (+)  Hematology  (+) Blood dyscrasia, anemia ,   Anesthesia Other Findings Chronic pain  Reproductive/Obstetrics negative OB ROS                             Anesthesia Physical Anesthesia Plan  ASA: 3  Anesthesia Plan: General   Post-op Pain Management: Minimal or no pain anticipated   Induction: Intravenous  PONV Risk Score and Plan: Propofol infusion  Airway Management Planned: Nasal Cannula and Natural Airway  Additional Equipment:   Intra-op Plan:   Post-operative Plan:   Informed Consent: I have reviewed the patients History and Physical, chart, labs and discussed the procedure including the risks, benefits and alternatives for the proposed anesthesia  with the patient or authorized representative who has indicated his/her understanding and acceptance.     Dental advisory given  Plan Discussed with: CRNA and Surgeon  Anesthesia Plan Comments:         Anesthesia Quick Evaluation

## 2022-08-19 NOTE — Op Note (Signed)
[facility] Patient Name: Taylor Obrien Procedure Date: 08/19/2022 12:17 PM MRN: [id number] Date of Birth: 1966-07-09 Attending MD: Elon Alas. Abbey Chatters DO CSN: [id number] Age: 56 Admit Type: Outpatient Procedure:                Colonoscopy Indications:              Iron deficiency anemia Providers:                Elon Alas. Abbey Chatters, DO, Janeece Riggers, RN, Caprice Kluver,                            Kristine L. Risa Grill, Technician, Everardo Pacific Referring MD:              Medicines:                See the Anesthesia note for documentation of the                            administered medications Complications:            No immediate complications. Estimated Blood Loss:     Estimated blood loss was minimal. Procedure:                Pre-Anesthesia Assessment:                           - The anesthesia plan was to use monitored                            anesthesia care (MAC).                           After obtaining informed consent, the colonoscope                            was passed under direct vision. Throughout the                            procedure, the patient's blood pressure, pulse, and                            oxygen saturations were monitored continuously. The                            [id number]-HQ190L ([id number]) scope was introduced through                            the anus and advanced to the the cecum, identified                            by appendiceal orifice and ileocecal valve. The                            colonoscopy was performed without difficulty. The                            patient tolerated  the procedure well. The quality                            of the bowel preparation was evaluated using the                            BBPS [facility] Bowel Preparation Scale) with scores                            of: Right Colon = 3, Transverse Colon = 3 and Left                            Colon = 3 (entire mucosa seen well with no residual                             staining, small fragments of stool or opaque                            liquid). The total BBPS score equals 9. Scope In: 1:24:53 PM Scope Out: 1:38:58 PM Scope Withdrawal Time: 0 hours 9 minutes 44 seconds  Total Procedure Duration: 0 hours 14 minutes 5 seconds  Findings:      The perianal and digital rectal examinations were normal.      Many small and large-mouthed diverticula were found in the entire colon.      Two sessile polyps were found in the descending colon and transverse       colon. The polyps were 4 to 5 mm in size. These polyps were removed with       a cold snare. Resection and retrieval were complete.      The exam was otherwise without abnormality. Impression:               - Diverticulosis in the entire examined colon.                           - Two 4 to 5 mm polyps in the descending colon and                            in the transverse colon, removed with a cold snare.                            Resected and retrieved.                           - The examination was otherwise normal. Moderate Sedation:      Per Anesthesia Care Recommendation:           - Patient has a contact number available for                            emergencies. The signs and symptoms of potential                            delayed complications were discussed with the  patient. Return to normal activities tomorrow.                            Written discharge instructions were provided to the                            patient.                           - Resume previous diet.                           - Continue present medications.                           - Await pathology results.                           - Repeat colonoscopy in 5 years for surveillance.                           - Return to GI clinic in 4 months. Procedure Code(s):        --- Professional ---                           [phone], Colonoscopy, flexible; with removal of                             tumor(s), polyp(s), or other lesion(s) by snare                            technique Diagnosis Code(s):        --- Professional ---                           K63.5, Polyp of colon                           D50.9, Iron deficiency anemia, unspecified                           K57.30, Diverticulosis of large intestine without                            perforation or abscess without bleeding CPT copyright 2019 American Medical Association. All rights reserved. The codes documented in this report are preliminary and upon coder review may  be revised to meet current compliance requirements. Elon Alas. Abbey Chatters, DO [place] Abbey Chatters, DO 08/19/2022 1:41:40 PM This report has been signed electronically. Number of Addenda: 0

## 2022-08-19 NOTE — Discharge Instructions (Addendum)
EGD Discharge instructions Please read the instructions outlined below and refer to this sheet in the next few weeks. These discharge instructions provide you with general information on caring for yourself after you leave the hospital. Your doctor may also give you specific instructions. While your treatment has been planned according to the most current medical practices available, unavoidable complications occasionally occur. If you have any problems or questions after discharge, please call your doctor. ACTIVITY You may resume your regular activity but move at a slower pace for the next 24 hours.  Take frequent rest periods for the next 24 hours.  Walking will help expel (get rid of) the air and reduce the bloated feeling in your abdomen.  No driving for 24 hours (because of the anesthesia (medicine) used during the test).  You may shower.  Do not sign any important legal documents or operate any machinery for 24 hours (because of the anesthesia used during the test).  NUTRITION Drink plenty of fluids.  You may resume your normal diet.  Begin with a light meal and progress to your normal diet.  Avoid alcoholic beverages for 24 hours or as instructed by your caregiver.  MEDICATIONS You may resume your normal medications unless your caregiver tells you otherwise.  WHAT YOU CAN EXPECT TODAY You may experience abdominal discomfort such as a feeling of fullness or "gas" pains.  FOLLOW-UP Your doctor will discuss the results of your test with you.  SEEK IMMEDIATE MEDICAL ATTENTION IF ANY OF THE FOLLOWING OCCUR: Excessive nausea (feeling sick to your stomach) and/or vomiting.  Severe abdominal pain and distention (swelling).  Trouble swallowing.  Temperature over 101 F (37.8 C).  Rectal bleeding or vomiting of blood.     Colonoscopy Discharge Instructions  Read the instructions outlined below and refer to this sheet in the next few weeks. These discharge instructions provide you with  general information on caring for yourself after you leave the hospital. Your doctor may also give you specific instructions. While your treatment has been planned according to the most current medical practices available, unavoidable complications occasionally occur.   ACTIVITY You may resume your regular activity, but move at a slower pace for the next 24 hours.  Take frequent rest periods for the next 24 hours.  Walking will help get rid of the air and reduce the bloated feeling in your belly (abdomen).  No driving for 24 hours (because of the medicine (anesthesia) used during the test).   Do not sign any important legal documents or operate any machinery for 24 hours (because of the anesthesia used during the test).  NUTRITION Drink plenty of fluids.  You may resume your normal diet as instructed by your doctor.  Begin with a light meal and progress to your normal diet. Heavy or fried foods are harder to digest and may make you feel sick to your stomach (nauseated).  Avoid alcoholic beverages for 24 hours or as instructed.  MEDICATIONS You may resume your normal medications unless your doctor tells you otherwise.  WHAT YOU CAN EXPECT TODAY Some feelings of bloating in the abdomen.  Passage of more gas than usual.  Spotting of blood in your stool or on the toilet paper.  IF YOU HAD POLYPS REMOVED DURING THE COLONOSCOPY: No aspirin products for 7 days or as instructed.  No alcohol for 7 days or as instructed.  Eat a soft diet for the next 24 hours.  FINDING OUT THE RESULTS OF YOUR TEST Not all test results are  available during your visit. If your test results are not back during the visit, make an appointment with your caregiver to find out the results. Do not assume everything is normal if you have not heard from your caregiver or the medical facility. It is important for you to follow up on all of your test results.  SEEK IMMEDIATE MEDICAL ATTENTION IF: You have more than a spotting of  blood in your stool.  Your belly is swollen (abdominal distention).  You are nauseated or vomiting.  You have a temperature over 101.  You have abdominal pain or discomfort that is severe or gets worse throughout the day.   Your EGD revealed mild amount inflammation in your stomach.  I took biopsies of this to rule out infection with a bacteria called H. pylori.  Await pathology results, my office will contact you. Continue on Pantoprazole daily.   Your colonoscopy revealed 2 polyp(s) which I removed successfully. Await pathology results, my office will contact you. I recommend repeating colonoscopy in 5 years for surveillance purposes.   You also have diverticulosis and internal hemorrhoids. I would recommend increasing fiber in your diet or adding OTC Benefiber/Metamucil. Be sure to drink at least 4 to 6 glasses of water daily. Follow-up with GI in 3-4 months.  I hope you have a great rest of your week!  Elon Alas. Abbey Chatters, D.O. Gastroenterology and Hepatology [facility]

## 2022-08-19 NOTE — H&P (Signed)
Primary Care Physician:  Darrol Jump, NP Primary Gastroenterologist:  Dr. Abbey Chatters  Pre-Procedure History & Physical: HPI:  Taylor Obrien is a 56 y.o. male is here for an EGD and colonoscopy to be performed for iron deficiency anemia, abnormal CT imaging, chronic GERD. Past Medical History:  Diagnosis Date   Acute alcoholic pancreatitis    Arthritis    Chronic pain    Depression    Diabetes mellitus without complication ([place])    Essential hypertension, benign    GERD (gastroesophageal reflux disease)    Headache    HTN (hypertension) 11/27/2015   Hyperlipidemia    Sleep apnea    could not tolerate CPAP   Stroke ([place])    2016    Past Surgical History:  Procedure Laterality Date   CLOSED REDUCTION MANDIBULAR FRACTURE W/ ARCH BARS     + multiple extractions   Condyloma resection     MULTIPLE EXTRACTIONS WITH ALVEOLOPLASTY Bilateral 01/23/2018   Procedure: DENTAL EXTRACTION OF TEETH NUMBER ONE, TWO, THREE, FOUR, FIVE, SIX, SEVEN, EIGHT, NINE, TEN, ELEVEN, TWELVE, THIRTEEN, FOURTEEN, FIFTEEN, SIXTEEN, SEVENTEEN, TWENTY, TWENTY-ONE, TWENTY-TWO, TWENTY-THREE, TWENTY-FOUR, TWENTY-FIVE, TWENTY-SIX, TWENTY-SEVEN, TWENTY-EIGHT, TWENTY-NINE, THIRTY-TWO WITH ALVEOLOPLASTY;  Surgeon: Diona Browner, DDS;  Location: [place];  Service: Oral Surgery;  Lat   RADIOLOGY WITH ANESTHESIA N/A 11/18/2015   Procedure: RADIOLOGY WITH ANESTHESIA;  Surgeon: Luanne Bras, MD;  Location: [place];  Service: Radiology;  Laterality: N/A;   Removal foreign body right shoulder     Right rotator cuff repair     TEE WITHOUT CARDIOVERSION N/A 11/21/2015   Procedure: TRANSESOPHAGEAL ECHOCARDIOGRAM (TEE);  Surgeon: Larey Dresser, MD;  Location: [place];  Service: Cardiovascular;  Laterality: N/A;   TOOTH EXTRACTION N/A 01/24/2018   Procedure: SUTURE ORAL WOUND;  Surgeon: Diona Browner, DDS;  Location: [place];  Service: Oral Surgery;  Laterality: N/A;    Prior to Admission medications   Medication Sig Start  Date End Date Taking? Authorizing Provider  amLODipine (NORVASC) 10 MG tablet Take 1 tablet (10 mg total) by mouth daily. 01/19/16  Yes Kirsteins, [name], MD  cloNIDine (CATAPRES - DOSED IN MG/24 HR) 0.3 mg/24hr patch Place 1 patch (0.3 mg total) onto the skin once a week. Next change on monday Patient taking differently: Place 0.3 mg onto the skin every Wednesday. Next change on monday 12/20/15  Yes Love, Ivan Anchors, PA-C  escitalopram (LEXAPRO) 10 MG tablet Take 10 mg by mouth daily.   Yes [provider]  fenofibrate (TRICOR) 145 MG tablet Take 145 mg by mouth daily. 04/10/22  Yes [provider]  hydrOXYzine (ATARAX) 25 MG tablet Take 25 mg by mouth every 8 (eight) hours as needed for anxiety. 04/17/22  Yes [provider]  labetalol (NORMODYNE) 300 MG tablet Take 300 mg by mouth 2 (two) times daily. 03/08/22  Yes [provider]  lisinopril (ZESTRIL) 10 MG tablet Take 10 mg by mouth daily. 06/18/22  Yes [provider]  metFORMIN (GLUCOPHAGE) 500 MG tablet Take 1 tablet (500 mg total) by mouth 2 (two) times daily with a meal. 03/18/17  Yes Forde Dandy, MD  nicotine (NICODERM CQ - DOSED IN MG/24 HOURS) 14 mg/24hr patch Place 14 mg onto the skin daily. 05/10/22  Yes [provider]  oxyCODONE (OXY IR/ROXICODONE) 5 MG immediate release tablet Take 5 mg by mouth every 8 (eight) hours. 06/28/22  Yes [provider]  pantoprazole (PROTONIX) 40 MG tablet Take 1 tablet (40 mg total) by mouth  at bedtime. 01/19/16  Yes Kirsteins, [name], MD  traZODone (DESYREL) 100 MG tablet Take 1 tablet (100 mg total) by mouth at bedtime. 01/19/16  Yes Kirsteins, [name], MD  VASCEPA 1 g capsule Take 2 g by mouth 2 (two) times daily. 03/12/22  Yes [provider]  aspirin 325 MG tablet Take 1 tablet (325 mg total) by mouth daily. 12/20/15   Love, Ivan Anchors, PA-C  diphenhydramine-acetaminophen (TYLENOL PM) 25-500 MG TABS tablet Take 1-2 tablets by mouth at  bedtime as needed (for sleep). Depends on insomnia if takes 1-2 tablets    [provider]  empagliflozin (JARDIANCE) 25 MG TABS tablet Take 25 mg by mouth daily.     [provider]  ondansetron (ZOFRAN) 4 MG tablet Take 1 tablet (4 mg total) by mouth every 6 (six) hours as needed for nausea. 07/20/22   Manuella Ghazi, Pratik D, DO  triamcinolone cream (KENALOG) 0.1 % Apply 1 application. topically daily.    [provider]    Allergies as of 07/15/2022 - Review Complete 07/08/2022  Allergen Reaction Noted   Oxcarbazepine Other (See Comments) 08/02/2011    Family History  Problem Relation Age of Onset   Diabetes Mother    Hypertension Mother    Drug abuse Mother    Hyperlipidemia Mother    Diabetes Father    Hypertension Father    Hyperlipidemia Father    Diabetes Brother    Hypertension Brother     Social History   Socioeconomic History   Marital status: Married    Spouse name: Not on file   Number of children: Not on file   Years of education: Not on file   Highest education level: Not on file  Occupational History   Not on file  Tobacco Use   Smoking status: Every Day    Packs/day: 0.50    Years: 30.00    Total pack years: 15.00    Types: Cigarettes   Smokeless tobacco: Never  Vaping Use   Vaping Use: Never used  Substance and Sexual Activity   Alcohol use: Not Currently    Comment: 2 40 oz daily. 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 Stopped liquor 5 years ago.states no etoh since 03/2022   Drug use: Yes    Types: Marijuana    Comment: occassionally   Sexual activity: Yes    Birth control/protection: None  Other Topics Concern   Not on file  Social History Narrative   Not on file   Social Determinants of Health   Financial Resource Strain: Not on file  Food Insecurity: Not on file  Transportation Needs: Not on file  Physical Activity: Not on file  Stress: Not on file  Social Connections: Not on file  Intimate Partner Violence: Not on  file    Review of Systems: General: Negative for fever, chills, fatigue, weakness. Eyes: Negative for vision changes.  ENT: Negative for hoarseness, difficulty swallowing , nasal congestion. CV: Negative for chest pain, angina, palpitations, dyspnea on exertion, peripheral edema.  Respiratory: Negative for dyspnea at rest, dyspnea on exertion, cough, sputum, wheezing.  GI: See history of present illness. GU:  Negative for dysuria, hematuria, urinary incontinence, urinary frequency, nocturnal urination.  MS: Negative for joint pain, low back pain.  Derm: Negative for rash or itching.  Neuro: Negative for weakness, abnormal sensation, seizure, frequent headaches, memory loss, confusion.  Psych: Negative for anxiety, depression Endo: Negative for unusual weight change.  Heme: Negative for bruising or bleeding.  Allergy: Negative for rash or hives.  Physical Exam: Vital signs in last 24 hours: Temp:  [98.4 F (36.9 C)] 98.4 F (36.9 C) (09/11 1258) Pulse Rate:  [72] 72 (09/11 1258) Resp:  [18] 18 (09/11 1258) BP: (153)/(90) 153/90 (09/11 1258) SpO2:  [99 %] 99 % (09/11 1258)   General:   Alert,  Well-developed, well-nourished, pleasant and cooperative in NAD Head:  Normocephalic and atraumatic. Eyes:  Sclera clear, no icterus.   Conjunctiva pink. Ears:  Normal auditory acuity. Nose:  No deformity, discharge,  or lesions. Mouth:  No deformity or lesions, dentition normal. Neck:  Supple; no masses or thyromegaly. Lungs:  Clear throughout to auscultation.   No wheezes, crackles, or rhonchi. No acute distress. Heart:  Regular rate and rhythm; no murmurs, clicks, rubs,  or gallops. Abdomen:  Soft, nontender and nondistended. No masses, hepatosplenomegaly or hernias noted. Normal bowel sounds, without guarding, and without rebound.   Msk:  Symmetrical without gross deformities. Normal posture. Extremities:  Without clubbing or edema. Neurologic:  Alert and  oriented x4;  grossly normal  neurologically. Skin:  Intact without significant lesions or rashes. Cervical Nodes:  No significant cervical adenopathy. Psych:  Alert and cooperative. Normal mood and affect.   Impression/Plan: Taylor Obrien is here for an EGD and colonoscopy to be performed for iron deficiency anemia, abnormal CT imaging, chronic GERD.  Risks, benefits, limitations, imponderables and alternatives regarding EGD have been reviewed with the patient. Questions have been answered. All parties agreeable.

## 2022-08-19 NOTE — Anesthesia Postprocedure Evaluation (Signed)
Anesthesia Post Note  Patient: SHERARD SUTCH  Procedure(s) Performed: COLONOSCOPY WITH PROPOFOL ESOPHAGOGASTRODUODENOSCOPY (EGD) WITH PROPOFOL BIOPSY POLYPECTOMY  Patient location during evaluation: Phase II Anesthesia Type: General Level of consciousness: awake and alert and oriented Pain management: pain level controlled Vital Signs Assessment: post-procedure vital signs reviewed and stable Respiratory status: spontaneous breathing, nonlabored ventilation and respiratory function stable Cardiovascular status: stable and blood pressure returned to baseline Postop Assessment: no apparent nausea or vomiting Anesthetic complications: no   No notable events documented.   Last Vitals:  Vitals:   08/19/22 1341 08/19/22 1346  BP: 109/78 114/79  Pulse: 77   Resp: 15   Temp: 36.4 C   SpO2: 96%     Last Pain:  Vitals:   08/19/22 1341  TempSrc: Axillary  PainSc: 0-No pain                 Emily Forse C Kamdyn Colborn

## 2022-08-20 LAB — SURGICAL PATHOLOGY

## 2022-08-23 ENCOUNTER — Encounter (HOSPITAL_COMMUNITY): Payer: Self-pay | Admitting: Internal Medicine

## 2022-08-23 ENCOUNTER — Ambulatory Visit (HOSPITAL_COMMUNITY): Payer: Medicare Other

## 2022-12-20 NOTE — Progress Notes (Deleted)
GI Office Note    Referring Provider: Darrol Jump, NP Primary Care Physician:  Darrol Jump, NP Primary Gastroenterologist: Elon Alas. Abbey Chatters, DO  Date:  12/20/2022  ID:  Taylor Obrien, DOB [birth year], MRN [id number]   Chief Complaint   No chief complaint on file.   History of Present Illness  Taylor Obrien is a 57 y.o. male with a history of alcohol abuse, depression, diabetes, HTN, GERD, HLD, stroke in 2016, pancreatitis, transaminitis, constipation, elevated LFTs and negative hepatitis workup, hepatic steatosis, GERD, anemia*** presenting today for follow-up.  Prior imaging impressions: CT A/P 07/19/2022: -Interval worsening of pancreatitis.  Increasing peripancreatic inflammatory changes as well as development of a small amount of ascites -No evidence of pancreatic necrosis.  No venous thrombosis -Cystic masses in the pancreatic head and body are stable consistent with acute pancreatic fluid collections -Stable 1.2 cm anterior midpole right renal cyst.  No follow-up recommended.  Small nonobstructing stone in the lower pole of right kidney  RUQ [place] July 2023: -No evidence of gallstones or gallbladder wall thickening -Hepatic steatosis  CT A/P July 2023: -Unchanged fluid collections in pancreatic head and body compared to May -Mild residual peripancreatic inflammatory change  CT A/P without contrast June 2023: -Increase prebiotic fat stranding about the head and uncinate process, consistent with acute pancreatitis.  Previous fluid collections within pancreatic head and body on CT last month are less well-defined on this current unenhanced exam.  Vague low-density in these regions.  No pancreatic gas or definite new peripancreatic collection -Punctate nonobstructing stone in the lower right kidney -Colonic diverticulosis without diverticulitis  RUQ [place] May 2023 -Moderate gallbladder sludge without additional sonographic evidence to suggest acute cholecystitis -Hepatic  steatosis  CT A/P with contrast May 2023: -Heterogeneous large mid pancreatic head with ill-defined fluid collections and surrounding inflammatory changes consistent with acute pancreatitis and acute peripancreatic fluid collections in this patient with an elevated serum lipase level.  No evidence of pancreatic necrosis or hemorrhage -No evidence of bowel obstruction or focal bowel lesion -Indeterminate right renal lesion, statistically mildly complex cyst -Consider nonemergent renal ultrasound for further characterization   Last office visit 07/08/2022.  Seen for hospital follow-up for suspected acute alcoholic pancreatitis.  Previously drinking 4 (40oz) beers daily.  At time of visit he denied EtOH use since April however noted during admission in May 2023 that he was drinking alcohol then.  Reported improvement in diet, denied abdominal pain.  Continues to smoke.  Still struggling with constipation with a bowel movement every day, [place] 3 stools hard.  Denies melena or BRBPR.  Chronic GERD controlled with medications.  No dysphagia.  Stated previously on chronic iron therapy.  Advised ongoing EtOH cessation, low-fat diet.  Recommended CT abdomen with pancreatic protocol in 4 to 6 weeks with reassessment of right renal lesion.  Advise daily MiraLAX.  TCS and EGD recommended.  Patient hospitalized 07/19/2022 for recurrent acute pancreatitis and mild AKI.  WBC 11, creatinine 1.8, lipase 752.  Received IV hydration and slow advancement of diet.  CT A/P as outlined above.  Patient discharged 07/20/2022.  Labs 07/20/2022: Hemoglobin 11.3, MCV 92.2, platelets 296.  Creatinine 1.49 albumin 2.6 normal LFTs.  Alk phos 31, T. bili 0.9.  Magnesium 1.7  Colonoscopy 08/19/2022: -Pancolonic diverticulosis -2 polyps in the descending and transverse colon -Tubular adenomas, negative for dysplasia or carcinoma -Repeat TCS in 5 years  EGD 08/19/2022: -Normal esophagus -Small hiatal hernia -Gastritis s/p  biopsy -Normal duodenum -Biopsy with mild chronic fundic  gastritis, negative H. pylori -Recommended Protonix daily  Today:   Current Outpatient Medications  Medication Sig Dispense Refill   amLODipine (NORVASC) 10 MG tablet Take 1 tablet (10 mg total) by mouth daily. 30 tablet 0   aspirin 325 MG tablet Take 1 tablet (325 mg total) by mouth daily. 100 tablet 0   cloNIDine (CATAPRES - DOSED IN MG/24 HR) 0.3 mg/24hr patch Place 1 patch (0.3 mg total) onto the skin once a week. Next change on monday (Patient taking differently: Place 0.3 mg onto the skin every Wednesday. Next change on monday) 4 patch 12   diphenhydramine-acetaminophen (TYLENOL PM) 25-500 MG TABS tablet Take 1-2 tablets by mouth at bedtime as needed (for sleep). Depends on insomnia if takes 1-2 tablets     empagliflozin (JARDIANCE) 25 MG TABS tablet Take 25 mg by mouth daily.      escitalopram (LEXAPRO) 10 MG tablet Take 10 mg by mouth daily.     fenofibrate (TRICOR) 145 MG tablet Take 145 mg by mouth daily.     hydrOXYzine (ATARAX) 25 MG tablet Take 25 mg by mouth every 8 (eight) hours as needed for anxiety.     labetalol (NORMODYNE) 300 MG tablet Take 300 mg by mouth 2 (two) times daily.     lisinopril (ZESTRIL) 10 MG tablet Take 10 mg by mouth daily.     metFORMIN (GLUCOPHAGE) 500 MG tablet Take 1 tablet (500 mg total) by mouth 2 (two) times daily with a meal. 60 tablet 3   nicotine (NICODERM CQ - DOSED IN MG/24 HOURS) 14 mg/24hr patch Place 14 mg onto the skin daily.     ondansetron (ZOFRAN) 4 MG tablet Take 1 tablet (4 mg total) by mouth every 6 (six) hours as needed for nausea. 20 tablet 0   oxyCODONE (OXY IR/ROXICODONE) 5 MG immediate release tablet Take 5 mg by mouth every 8 (eight) hours.     pantoprazole (PROTONIX) 40 MG tablet Take 1 tablet (40 mg total) by mouth at bedtime. 30 tablet 0   traZODone (DESYREL) 100 MG tablet Take 1 tablet (100 mg total) by mouth at bedtime. 30 tablet 1   triamcinolone cream (KENALOG)  0.1 % Apply 1 application. topically daily.     VASCEPA 1 g capsule Take 2 g by mouth 2 (two) times daily.     No current facility-administered medications for this visit.    Past Medical History:  Diagnosis Date   Acute alcoholic pancreatitis    Arthritis    Chronic pain    Depression    Diabetes mellitus without complication ([place])    Essential hypertension, benign    GERD (gastroesophageal reflux disease)    Headache    HTN (hypertension) 11/27/2015   Hyperlipidemia    Sleep apnea    could not tolerate CPAP   Stroke ([place])    2016    Past Surgical History:  Procedure Laterality Date   BIOPSY  08/19/2022   Procedure: BIOPSY;  Surgeon: Eloise Harman, DO;  [facility];  Service: Endoscopy;;   CLOSED REDUCTION MANDIBULAR FRACTURE W/ ARCH BARS     + multiple extractions   COLONOSCOPY WITH PROPOFOL N/A 08/19/2022   Procedure: COLONOSCOPY WITH PROPOFOL;  Surgeon: Eloise Harman, DO;  [facility];  Service: Endoscopy;  Laterality: N/A;  9:15am   Condyloma resection     ESOPHAGOGASTRODUODENOSCOPY (EGD) WITH PROPOFOL N/A 08/19/2022   Procedure: ESOPHAGOGASTRODUODENOSCOPY (EGD) WITH PROPOFOL;  Surgeon: Eloise Harman, DO;  [facility];  Service: Endoscopy;  Laterality: N/A;   MULTIPLE EXTRACTIONS WITH ALVEOLOPLASTY Bilateral 01/23/2018   Procedure: DENTAL EXTRACTION OF TEETH NUMBER ONE, TWO, THREE, FOUR, FIVE, SIX, SEVEN, EIGHT, NINE, TEN, ELEVEN, TWELVE, THIRTEEN, FOURTEEN, FIFTEEN, SIXTEEN, SEVENTEEN, TWENTY, TWENTY-ONE, TWENTY-TWO, TWENTY-THREE, TWENTY-FOUR, TWENTY-FIVE, TWENTY-SIX, TWENTY-SEVEN, TWENTY-EIGHT, TWENTY-NINE, THIRTY-TWO WITH ALVEOLOPLASTY;  Surgeon: Diona Browner, DDS;  Location: [place];  Service: Oral Surgery;  Lat   POLYPECTOMY  08/19/2022   Procedure: POLYPECTOMY;  Surgeon: Eloise Harman, DO;  [facility];  Service: Endoscopy;;   RADIOLOGY WITH ANESTHESIA N/A 11/18/2015   Procedure: RADIOLOGY WITH ANESTHESIA;   Surgeon: Luanne Bras, MD;  Location: [place];  Service: Radiology;  Laterality: N/A;   Removal foreign body right shoulder     Right rotator cuff repair     TEE WITHOUT CARDIOVERSION N/A 11/21/2015   Procedure: TRANSESOPHAGEAL ECHOCARDIOGRAM (TEE);  Surgeon: Larey Dresser, MD;  Location: [place];  Service: Cardiovascular;  Laterality: N/A;   TOOTH EXTRACTION N/A 01/24/2018   Procedure: SUTURE ORAL WOUND;  Surgeon: Diona Browner, DDS;  Location: [place];  Service: Oral Surgery;  Laterality: N/A;    Family History  Problem Relation Age of Onset   Diabetes Mother    Hypertension Mother    Drug abuse Mother    Hyperlipidemia Mother    Diabetes Father    Hypertension Father    Hyperlipidemia Father    Diabetes Brother    Hypertension Brother     Allergies as of 12/23/2022 - Review Complete 08/19/2022  Allergen Reaction Noted   Oxcarbazepine Other (See Comments) 08/02/2011    Social History   Socioeconomic History   Marital status: Married    Spouse name: Not on file   Number of children: Not on file   Years of education: Not on file   Highest education level: Not on file  Occupational History   Not on file  Tobacco Use   Smoking status: Every Day    Packs/day: 0.50    Years: 30.00    Total pack years: 15.00    Types: Cigarettes   Smokeless tobacco: Never  Vaping Use   Vaping Use: Never used  Substance and Sexual Activity   Alcohol use: Not Currently    Comment: 2 40 oz daily. 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 Stopped liquor 5 years ago.states no etoh since 03/2022   Drug use: Yes    Types: Marijuana    Comment: occassionally   Sexual activity: Yes    Birth control/protection: None  Other Topics Concern   Not on file  Social History Narrative   Not on file   Social Determinants of Health   Financial Resource Strain: Not on file  Food Insecurity: Not on file  Transportation Needs: Not on file  Physical Activity: Not on file  Stress: Not on file   Social Connections: Not on file     Review of Systems   Gen: Denies fever, chills, anorexia. Denies fatigue, weakness, weight loss.  CV: Denies chest pain, palpitations, syncope, peripheral edema, and claudication. Resp: Denies dyspnea at rest, cough, wheezing, coughing up blood, and pleurisy. GI: See HPI Derm: Denies rash, itching, dry skin Psych: Denies depression, anxiety, memory loss, confusion. No homicidal or suicidal ideation.  Heme: Denies bruising, bleeding, and enlarged lymph nodes.   Physical Exam   There were no vitals taken for this visit.  General:   Alert and oriented. No distress noted. Pleasant and cooperative.  Head:  Normocephalic and  atraumatic. Eyes:  Conjuctiva clear without scleral icterus. Mouth:  Oral mucosa pink and moist. Good dentition. No lesions. Lungs:  Clear to auscultation bilaterally. No wheezes, rales, or rhonchi. No distress.  Heart:  S1, S2 present without murmurs appreciated.  Abdomen:  +BS, soft, non-tender and non-distended. No rebound or guarding. No HSM or masses noted. Rectal: *** Msk:  Symmetrical without gross deformities. Normal posture. Extremities:  Without edema. Neurologic:  Alert and  oriented x4 Psych:  Alert and cooperative. Normal mood and affect.   Assessment  Taylor Obrien is a 57 y.o. male with a history of alcohol abuse, depression, diabetes, HTN, GERD, HLD, stroke in 2016, pancreatitis, transaminitis, constipation, elevated LFTs and negative hepatitis workup, hepatic steatosis, GERD, anemia*** presenting today for follow-up.   Pancreatitis:  GERD:  Constipation:  IDA:   Indeterminate right renal lesion:   PLAN   *** Continue alcohol cessation Low-fat diet MiraLAX?    Venetia Night, MSN, FNP-BC, AGACNP-BC [facility]

## 2022-12-23 ENCOUNTER — Ambulatory Visit: Payer: Medicare Other | Admitting: Gastroenterology

## 2023-06-18 ENCOUNTER — Emergency Department (HOSPITAL_COMMUNITY): Payer: Medicare Other

## 2023-06-18 ENCOUNTER — Observation Stay (HOSPITAL_COMMUNITY)
Admission: EM | Admit: 2023-06-18 | Discharge: 2023-06-19 | Disposition: A | Discharge status: Home Health | Payer: Medicare Other | Attending: Internal Medicine | Admitting: Internal Medicine

## 2023-06-18 ENCOUNTER — Other Ambulatory Visit: Payer: Self-pay

## 2023-06-18 ENCOUNTER — Encounter (HOSPITAL_COMMUNITY): Payer: Self-pay | Admitting: Emergency Medicine

## 2023-06-18 DIAGNOSIS — G4733 Obstructive sleep apnea (adult) (pediatric): Secondary | ICD-10-CM | POA: Diagnosis present

## 2023-06-18 DIAGNOSIS — Z7984 Long term (current) use of oral hypoglycemic drugs: Secondary | ICD-10-CM | POA: Diagnosis not present

## 2023-06-18 DIAGNOSIS — E119 Type 2 diabetes mellitus without complications: Secondary | ICD-10-CM

## 2023-06-18 DIAGNOSIS — N179 Acute kidney failure, unspecified: Secondary | ICD-10-CM | POA: Diagnosis not present

## 2023-06-18 DIAGNOSIS — Z7982 Long term (current) use of aspirin: Secondary | ICD-10-CM | POA: Insufficient documentation

## 2023-06-18 DIAGNOSIS — R0789 Other chest pain: Secondary | ICD-10-CM

## 2023-06-18 DIAGNOSIS — Z79899 Other long term (current) drug therapy: Secondary | ICD-10-CM | POA: Diagnosis not present

## 2023-06-18 DIAGNOSIS — I639 Cerebral infarction, unspecified: Secondary | ICD-10-CM | POA: Diagnosis present

## 2023-06-18 DIAGNOSIS — M6281 Muscle weakness (generalized): Secondary | ICD-10-CM | POA: Insufficient documentation

## 2023-06-18 DIAGNOSIS — W19XXXA Unspecified fall, initial encounter: Secondary | ICD-10-CM

## 2023-06-18 DIAGNOSIS — F17210 Nicotine dependence, cigarettes, uncomplicated: Secondary | ICD-10-CM | POA: Insufficient documentation

## 2023-06-18 DIAGNOSIS — R778 Other specified abnormalities of plasma proteins: Secondary | ICD-10-CM | POA: Diagnosis not present

## 2023-06-18 DIAGNOSIS — N1831 Chronic kidney disease, stage 3a: Secondary | ICD-10-CM | POA: Diagnosis not present

## 2023-06-18 DIAGNOSIS — N189 Chronic kidney disease, unspecified: Secondary | ICD-10-CM

## 2023-06-18 DIAGNOSIS — Z8673 Personal history of transient ischemic attack (TIA), and cerebral infarction without residual deficits: Secondary | ICD-10-CM | POA: Insufficient documentation

## 2023-06-18 DIAGNOSIS — Y92009 Unspecified place in unspecified non-institutional (private) residence as the place of occurrence of the external cause: Secondary | ICD-10-CM

## 2023-06-18 DIAGNOSIS — I129 Hypertensive chronic kidney disease with stage 1 through stage 4 chronic kidney disease, or unspecified chronic kidney disease: Secondary | ICD-10-CM | POA: Diagnosis not present

## 2023-06-18 DIAGNOSIS — R079 Chest pain, unspecified: Secondary | ICD-10-CM | POA: Diagnosis present

## 2023-06-18 DIAGNOSIS — Z720 Tobacco use: Secondary | ICD-10-CM

## 2023-06-18 DIAGNOSIS — R2681 Unsteadiness on feet: Secondary | ICD-10-CM | POA: Diagnosis not present

## 2023-06-18 DIAGNOSIS — R2689 Other abnormalities of gait and mobility: Secondary | ICD-10-CM | POA: Diagnosis not present

## 2023-06-18 DIAGNOSIS — I10 Essential (primary) hypertension: Secondary | ICD-10-CM

## 2023-06-18 DIAGNOSIS — E1122 Type 2 diabetes mellitus with diabetic chronic kidney disease: Secondary | ICD-10-CM | POA: Diagnosis not present

## 2023-06-18 LAB — BASIC METABOLIC PANEL
Anion gap: 9 (ref 5–15)
BUN: 23 mg/dL — ABNORMAL HIGH (ref 6–20)
CO2: 24 mmol/L (ref 22–32)
Calcium: 9.4 mg/dL (ref 8.9–10.3)
Chloride: 107 mmol/L (ref 98–111)
Creatinine, Ser: 2.34 mg/dL — ABNORMAL HIGH (ref 0.61–1.24)
GFR, Estimated: 32 mL/min — ABNORMAL LOW (ref >60)
Glucose, Bld: 98 mg/dL (ref 70–99)
Potassium: 4.5 mmol/L (ref 3.5–5.1)
Sodium: 140 mmol/L (ref 135–145)

## 2023-06-18 LAB — CBC
HCT: 40.5 % (ref 39.0–52.0)
Hemoglobin: 13 g/dL (ref 13.0–17.0)
MCH: 30.2 pg (ref 26.0–34.0)
MCHC: 32.1 g/dL (ref 30.0–36.0)
MCV: 94 fL (ref 80.0–100.0)
Platelets: 289 10*3/uL (ref 150–400)
RBC: 4.31 MIL/uL (ref 4.22–5.81)
RDW: 16.1 % — ABNORMAL HIGH (ref 11.5–15.5)
WBC: 5.9 10*3/uL (ref 4.0–10.5)
nRBC: 0 % (ref 0.0–0.2)

## 2023-06-18 LAB — GLUCOSE, CAPILLARY: Glucose-Capillary: 74 mg/dL (ref 70–99)

## 2023-06-18 LAB — TROPONIN I (HIGH SENSITIVITY)
Troponin I (High Sensitivity): 38 ng/L — ABNORMAL HIGH (ref <18)
Troponin I (High Sensitivity): 37 ng/L — ABNORMAL HIGH (ref <18)

## 2023-06-18 MED ORDER — SODIUM CHLORIDE 0.9 % IV BOLUS
500.0000 mL | Freq: Once | INTRAVENOUS | Status: AC
  Administered 2023-06-18: 500 mL via INTRAVENOUS

## 2023-06-18 MED ORDER — NICOTINE 14 MG/24HR TD PT24
14.0000 mg | MEDICATED_PATCH | Freq: Every day | TRANSDERMAL | Status: DC
  Administered 2023-06-18: 14 mg via TRANSDERMAL
  Filled 2023-06-18: qty 1

## 2023-06-18 MED ORDER — OXYCODONE HCL 5 MG PO TABS
5.0000 mg | ORAL_TABLET | Freq: Four times a day (QID) | ORAL | Status: DC | PRN
  Administered 2023-06-18 – 2023-06-19 (×3): 5 mg via ORAL
  Filled 2023-06-18 (×3): qty 1

## 2023-06-18 MED ORDER — SODIUM CHLORIDE 0.9 % IV SOLN: INTRAVENOUS | Status: DC

## 2023-06-18 MED ORDER — ONDANSETRON HCL 4 MG PO TABS: 4.0000 mg | ORAL_TABLET | Freq: Four times a day (QID) | ORAL | Status: DC | PRN

## 2023-06-18 MED ORDER — OXYCODONE HCL 5 MG PO TABS
5.0000 mg | ORAL_TABLET | Freq: Once | ORAL | Status: AC
  Administered 2023-06-18: 5 mg via ORAL
  Filled 2023-06-18: qty 1

## 2023-06-18 MED ORDER — AMLODIPINE BESYLATE 5 MG PO TABS
10.0000 mg | ORAL_TABLET | Freq: Every day | ORAL | Status: DC
  Administered 2023-06-18 – 2023-06-19 (×2): 10 mg via ORAL
  Filled 2023-06-18 (×2): qty 2

## 2023-06-18 MED ORDER — ENOXAPARIN SODIUM 40 MG/0.4ML IJ SOSY
40.0000 mg | PREFILLED_SYRINGE | INTRAMUSCULAR | Status: DC
  Administered 2023-06-18: 40 mg via SUBCUTANEOUS
  Filled 2023-06-18: qty 0.4

## 2023-06-18 MED ORDER — ASPIRIN 325 MG PO TABS
325.0000 mg | ORAL_TABLET | Freq: Every day | ORAL | Status: DC
  Administered 2023-06-18 – 2023-06-19 (×2): 325 mg via ORAL
  Filled 2023-06-18 (×2): qty 1

## 2023-06-18 MED ORDER — ACETAMINOPHEN 650 MG RE SUPP: 650.0000 mg | Freq: Four times a day (QID) | RECTAL | Status: DC | PRN

## 2023-06-18 MED ORDER — ASPIRIN 325 MG PO TABS
325.0000 mg | ORAL_TABLET | Freq: Once | ORAL | Status: AC
  Administered 2023-06-18: 325 mg via ORAL
  Filled 2023-06-18: qty 1

## 2023-06-18 MED ORDER — ACETAMINOPHEN 325 MG PO TABS
650.0000 mg | ORAL_TABLET | Freq: Four times a day (QID) | ORAL | Status: DC | PRN
  Administered 2023-06-19: 650 mg via ORAL
  Filled 2023-06-18: qty 2

## 2023-06-18 MED ORDER — LIDOCAINE 5 % EX PTCH
1.0000 | MEDICATED_PATCH | CUTANEOUS | Status: DC
  Administered 2023-06-18: 1 via TRANSDERMAL
  Filled 2023-06-18: qty 1

## 2023-06-18 MED ORDER — INSULIN ASPART 100 UNIT/ML IJ SOLN: 0.0000 [IU] | Freq: Every day | INTRAMUSCULAR | Status: DC

## 2023-06-18 MED ORDER — CLONIDINE HCL 0.2 MG/24HR TD PTWK
0.3000 mg | MEDICATED_PATCH | TRANSDERMAL | Status: DC
  Administered 2023-06-18: 0.3 mg via TRANSDERMAL
  Filled 2023-06-18 (×2): qty 1

## 2023-06-18 MED ORDER — INSULIN ASPART 100 UNIT/ML IJ SOLN: 0.0000 [IU] | Freq: Three times a day (TID) | INTRAMUSCULAR | Status: DC

## 2023-06-18 MED ORDER — POLYETHYLENE GLYCOL 3350 17 G PO PACK
17.0000 g | PACK | Freq: Every day | ORAL | Status: DC | PRN
  Administered 2023-06-19: 17 g via ORAL
  Filled 2023-06-18: qty 1

## 2023-06-18 MED ORDER — ONDANSETRON HCL 4 MG/2ML IJ SOLN: 4.0000 mg | Freq: Four times a day (QID) | INTRAMUSCULAR | Status: DC | PRN

## 2023-06-18 NOTE — Assessment & Plan Note (Signed)
Smokes half pack of cigarettes daily.  Counseled to quit. -Nicotine patch

## 2023-06-18 NOTE — Assessment & Plan Note (Addendum)
Systolic 130s to 161W.  Patient unable to confirm medications he takes.   - Resume clonidine patch for now, Norvasc -Hold lisinopril with AKI -Pending med reconciliation resume labetalol

## 2023-06-18 NOTE — ED Provider Notes (Signed)
Taylor Obrien ([address]) Provider Note   CSN: [id number] Arrival date & time: 06/18/23  1157     History  Chief Complaint  Patient presents with   Fall   Chest Pain    Taylor Obrien is a 57 y.o. male.   Fall Associated symptoms include chest pain.  Chest Pain 57 year old male with history of hypertension, diabetes, GERD, hyperlipidemia, prior stroke baseline nonambulatory presenting for left chest wall pain.  Patient states 4 days ago he was in his wheelchair when he could not stop and he fell.  He landed on his left chest wall.  He did not hit his head or lose conscious.  He has no headache or vomiting or neck pain.  He is not on anticoagulation.  He states he presented today due to continued left-sided chest wall pain.  He does not have any abdominal or back pain.  He did have some abrasions to his elbow and left knee.  No history of DVT or PE.  No recent lower extremity edema or travel or immobilization compared to normal.  His pain is very much in his left chest wall and reproducible with palpation.     Home Medications Prior to Admission medications   Medication Sig Start Date End Date Taking? Authorizing Provider  amLODipine (NORVASC) 10 MG tablet Take 1 tablet (10 mg total) by mouth daily. 01/19/16   Kirsteins, Victorino Sparrow, MD  aspirin 325 MG tablet Take 1 tablet (325 mg total) by mouth daily. 12/20/15   Love, Evlyn Kanner, PA-C  cloNIDine (CATAPRES - DOSED IN MG/24 HR) 0.3 mg/24hr patch Place 1 patch (0.3 mg total) onto the skin once a week. Next change on monday Patient taking differently: Place 0.3 mg onto the skin every Wednesday. Next change on monday 12/20/15   Love, Evlyn Kanner, PA-C  diphenhydramine-acetaminophen (TYLENOL PM) 25-500 MG TABS tablet Take 1-2 tablets by mouth at bedtime as needed (for sleep). Depends on insomnia if takes 1-2 tablets    [provider]  empagliflozin (JARDIANCE) 25 MG TABS tablet Take 25 mg by mouth daily.      [provider]  escitalopram (LEXAPRO) 10 MG tablet Take 10 mg by mouth daily.    [provider]  fenofibrate (TRICOR) 145 MG tablet Take 145 mg by mouth daily. 04/10/22   [provider]  hydrOXYzine (ATARAX) 25 MG tablet Take 25 mg by mouth every 8 (eight) hours as needed for anxiety. 04/17/22   [provider]  labetalol (NORMODYNE) 300 MG tablet Take 300 mg by mouth 2 (two) times daily. 03/08/22   [provider]  lisinopril (ZESTRIL) 10 MG tablet Take 10 mg by mouth daily. 06/18/22   [provider]  metFORMIN (GLUCOPHAGE) 500 MG tablet Take 1 tablet (500 mg total) by mouth 2 (two) times daily with a meal. 03/18/17   Lavera Guise, MD  nicotine (NICODERM CQ - DOSED IN MG/24 HOURS) 14 mg/24hr patch Place 14 mg onto the skin daily. 05/10/22   [provider]  ondansetron (ZOFRAN) 4 MG tablet Take 1 tablet (4 mg total) by mouth every 6 (six) hours as needed for nausea. 07/20/22   Sherryll Burger, Pratik D, DO  oxyCODONE (OXY IR/ROXICODONE) 5 MG immediate release tablet Take 5 mg by mouth every 8 (eight) hours. 06/28/22   [provider]  pantoprazole (PROTONIX) 40 MG tablet Take 1 tablet (40 mg total) by mouth at bedtime. 01/19/16   Kirsteins, Victorino Sparrow, MD  traZODone (DESYREL) 100 MG tablet Take 1 tablet (100 mg total) by mouth at bedtime. 01/19/16   Kirsteins, Victorino Sparrow, MD  triamcinolone cream (KENALOG) 0.1 % Apply 1 application. topically daily.    [provider]  VASCEPA 1 g capsule Take 2 g by mouth 2 (two) times daily. 03/12/22   [provider]      Allergies    Ibuprofen and Oxcarbazepine    Review of Systems   Review of Systems  Cardiovascular:  Positive for chest pain.  Review of systems completed and notable as per HPI.  ROS otherwise negative.   Physical Exam Updated Vital Signs BP (!) 156/114   Pulse 69   Temp 98.5 F (36.9 C) (Oral)   Resp (!) 23   Ht 5\' 5"  (1.651 m)   Wt 93 kg   SpO2 96%   BMI 34.11  kg/m  Physical Exam Vitals and nursing note reviewed.  Constitutional:      General: He is not in acute distress.    Appearance: He is well-developed.  HENT:     Head: Normocephalic and atraumatic.  Eyes:     Extraocular Movements: Extraocular movements intact.     Conjunctiva/sclera: Conjunctivae normal.     Pupils: Pupils are equal, round, and reactive to light.  Cardiovascular:     Rate and Rhythm: Normal rate and regular rhythm.     Pulses:          Radial pulses are 2+ on the right side and 2+ on the left side.       Dorsalis pedis pulses are 2+ on the right side and 2+ on the left side.     Heart sounds: No murmur heard. Pulmonary:     Effort: Pulmonary effort is normal. No respiratory distress.     Breath sounds: Normal breath sounds.  Chest:     Chest wall: Tenderness present.  Abdominal:     Palpations: Abdomen is soft.     Tenderness: There is no abdominal tenderness. There is no right CVA tenderness, left CVA tenderness, guarding or rebound.  Musculoskeletal:        General: No swelling.     Cervical back: Normal range of motion and neck supple. No rigidity or tenderness.     Right lower leg: No edema.     Left lower leg: No edema.     Comments: Left-sided chest wall tenderness.  No skin changes or palpable deformity.  No spinal tenderness  Skin:    General: Skin is warm and dry.     Capillary Refill: Capillary refill takes less than 2 seconds.  Neurological:     General: No focal deficit present.     Mental Status: He is alert and oriented to person, place, and time.     Cranial Nerves: No cranial nerve deficit.  Psychiatric:        Mood and Affect: Mood normal.     ED Results / Procedures / Treatments   Labs (all labs ordered are listed, but only abnormal results are displayed) Labs Reviewed  BASIC METABOLIC PANEL - Abnormal; Notable for the following components:      Result Value   BUN 23 (*)    Creatinine, Ser 2.34 (*)    GFR, Estimated 32 (*)     All other components within normal limits  CBC - Abnormal; Notable for the following components:   RDW 16.1 (*)    All other components within normal limits  TROPONIN I (HIGH  SENSITIVITY) - Abnormal; Notable for the following components:   Troponin I (High Sensitivity) 38 (*)    All other components within normal limits  TROPONIN I (HIGH SENSITIVITY) - Abnormal; Notable for the following components:   Troponin I (High Sensitivity) 37 (*)    All other components within normal limits    EKG None  Radiology DG Chest 2 View  Result Date: 06/18/2023 CLINICAL DATA:  Rib pain. EXAM: CHEST - 2 VIEW COMPARISON:  07/19/2022. FINDINGS: Low lung volumes accentuate the pulmonary vasculature and cardiomediastinal silhouette. No consolidation or pulmonary edema. No pleural effusion or pneumothorax. No displaced rib fracture is seen. IMPRESSION: Low lung volumes without evidence of acute cardiopulmonary disease. No displaced rib fracture is seen. Electronically Signed   By: Orvan Falconer M.D.   On: 06/18/2023 13:01    Procedures Procedures    Medications Ordered in ED Medications  lidocaine (LIDODERM) 5 % 1 patch (1 patch Transdermal Patch Applied 06/18/23 1353)  oxyCODONE (Oxy IR/ROXICODONE) immediate release tablet 5 mg (5 mg Oral Given 06/18/23 1353)  aspirin tablet 325 mg (325 mg Oral Given 06/18/23 1353)    ED Course/ Medical Decision Making/ A&P Clinical Course as of 06/18/23 1603  Wed Jun 18, 2023  1318 EKG sinus rhythm with LVH.  He has inferior Q waves and T wave inversions as well as some T wave inversions laterally.  I reviewed his prior EKG from August 2023 which showed similar LVH but TWI appear new laterally [JD]    Clinical Course User Index [JD] Laurence Spates, MD                             Medical Decision Making Amount and/or Complexity of Data Reviewed Labs: ordered. Radiology: ordered.  Risk OTC drugs. Prescription drug management.   Medical Decision Making:    Taylor Obrien is a 57 y.o. male who presented to the ED today with left-sided chest wall pain after fall.  Patient reports fall 4 days ago which was mechanical with left-sided chest wall pain.  He did not have any head trauma or neck trauma and is not on anticoagulation, especially 4 days out with no symptoms I have low concern for head or neck injury at this time.  He has clear left chest wall pain which is reproducible on exam.  Could be bruising versus rib fracture.  No other external signs of trauma.   Reviewed and confirmed nursing documentation for past medical history, family history, social history.  Initial Study Results:   Laboratory  All laboratory results reviewed.  Labs notable for elevated troponin, no prior to compare to.  BMP notable for AKI.  CBC unremarkable.  EKG EKG sinus rhythm with LVH.  He has inferior Q waves and T wave inversions as well as some T wave inversions laterally.  I reviewed his prior EKG from August 2023 which showed similar LVH but TWI appear new laterally  Radiology:  All images reviewed independently.  Chest x-ray without pneumonia, pneumothorax, or displaced rib fracture.  Agree with radiology report at this time.     Reassessment and Plan:   On reassessment pain is resolved after oxycodone and lidocaine patch.  I do think his acute pain is related to muscle skeletal pain after his fall.  However he does have new T wave inversions on EKG as well as elevated troponin.  Unclear baseline given no prior, will obtain delta troponin.  I do  not see any signs of dissection, PE.  He does have mild AKI as well, could be worsening CKD compared to 1 year ago although no more recent labs.  He has been hydrating well and clinically is euvolemic.  I gave handoff to Dr. Deretha Emory at 3:45 PM with plan to follow-up repeat troponin, discuss with cardiology given chest pain in the setting of elevated troponin and abnormal EKG.   [name] presentation is most consistent  with acute presentation with potential threat to life or bodily function.           Final Clinical Impression(s) / ED Diagnoses Final diagnoses:  Chest wall pain    Rx / DC Orders ED Discharge Orders     None         Laurence Spates, MD 06/18/23 [phone]

## 2023-06-18 NOTE — Assessment & Plan Note (Signed)
-   HgbA1c -Hold metformin, Jardiance - SSI- M

## 2023-06-18 NOTE — Assessment & Plan Note (Addendum)
Creatinine 2.34, baseline 1.4-1.8.  CKD 3 A. Denies GI losses, has had good oral intake.  Reports diarrhea a week ago that has resolved. Home medications include lisinopril 10mg  daily - given, cont N/s 100cc/hr x 15hrs -Hold lisinopril

## 2023-06-18 NOTE — ED Notes (Signed)
Report given to 3rd floor.

## 2023-06-18 NOTE — Assessment & Plan Note (Signed)
Fall off wheelchair, with subsequent chest pain 4 days ago.  Chest x-ray negative for fracture.

## 2023-06-18 NOTE — ED Provider Notes (Signed)
[name] workup for troponins was normal.  First troponin was 30 8 repeat was 37 so no acute changes there.  The pain in the chest is probably chest wall pain.  However patient's GFR today is 32 creatinine 2.34 potassium at 4.5.  CBC normal.  Chest x-ray without evidence of any acute findings.  EKG had nothing acute.  But this seems to be twice as bad kidney function compared to a year ago.  So not sure what has been lately.  But will start IV fluids give 500 cc bolus and will contact for admission for acute kidney injury.  Patient is okay with that.   Vanetta Mulders, MD 06/18/23 [phone]

## 2023-06-18 NOTE — Assessment & Plan Note (Addendum)
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  Ambulates with wheelchair. -Resume aspirin -No statin on med list.

## 2023-06-18 NOTE — ED Notes (Signed)
MD in room at this time.

## 2023-06-18 NOTE — ED Triage Notes (Signed)
Pt via POV reporting left-sided rib pain after he fell out of his wheelchair 4 days ago. Pt notes it is difficult to lie on the left side and it hurts more with deep inhalation.

## 2023-06-18 NOTE — Assessment & Plan Note (Signed)
Musculoskeletal pain secondary to trauma-fall from wheelchair.  Chest tender to palpation.  Chest x-ray without rib fracture.  EKG with diffuse T wave inversions in lead to 3 aVF V4 through V6.  Changes to lead I and aVL are old and unchanged from prior.  Ongoing tobacco abuse. -Troponin 38 > 37 -Repeat EKG in a.m. -Resume aspirin, he is not on statin.

## 2023-06-18 NOTE — H&P (Addendum)
History and Physical    Taylor Obrien:[id number] DOB: 11-Jan-1966 DOA: 06/18/2023  PCP: Renaldo Harrison, NP   Patient coming from: Home  I have personally briefly reviewed patient's old medical records in [facility]  Chief Complaint: Chest Pain  HPI: Taylor Obrien is a 57 y.o. male with medical history significant for  HTN, CKD 3, diabetes mellitus, OSA.  Patient presented to the ED with complaints of chest pain that started about 4 days ago.  Patient reports 4 days ago he was on his wheelchair, and he could not stop, he fell out of his wheelchair onto the left side of his chest.  He has subsequently had pain to the same side of his chest.  Pain is worse with coughing.  He smokes cigarettes. Reports good oral intake, no vomiting. He reports diarrhea about a week ago that has resolved.   ED Course: Blood pressure systolic 130s to 562Z.  Troponin 38 > 37.  Chest x-ray negative for acute abnormality.  EKG shows some T wave abnormalities.  Creatinine elevated at 2.34. 500 mill bolus given.  Review of Systems: As per HPI all other systems reviewed and negative.  Past Medical History:  Diagnosis Date   Acute alcoholic pancreatitis    Arthritis    Chronic pain    Depression    Diabetes mellitus without complication (HCC)    Essential hypertension, benign    GERD (gastroesophageal reflux disease)    Headache    HTN (hypertension) 11/27/2015   Hyperlipidemia    Sleep apnea    could not tolerate CPAP   Stroke (HCC)    2016    Past Surgical History:  Procedure Laterality Date   BIOPSY  08/19/2022   Procedure: BIOPSY;  Surgeon: Lanelle Bal, DO;  Location: [facility];  Service: Endoscopy;;   CLOSED REDUCTION MANDIBULAR FRACTURE W/ ARCH BARS     + multiple extractions   COLONOSCOPY WITH PROPOFOL N/A 08/19/2022   Procedure: COLONOSCOPY WITH PROPOFOL;  Surgeon: Lanelle Bal, DO;  Location: [facility];  Service: Endoscopy;  Laterality: N/A;  9:15am   Condyloma  resection     ESOPHAGOGASTRODUODENOSCOPY (EGD) WITH PROPOFOL N/A 08/19/2022   Procedure: ESOPHAGOGASTRODUODENOSCOPY (EGD) WITH PROPOFOL;  Surgeon: Lanelle Bal, DO;  Location: [facility];  Service: Endoscopy;  Laterality: N/A;   MULTIPLE EXTRACTIONS WITH ALVEOLOPLASTY Bilateral 01/23/2018   Procedure: DENTAL EXTRACTION OF TEETH NUMBER ONE, TWO, THREE, FOUR, FIVE, SIX, SEVEN, EIGHT, NINE, TEN, ELEVEN, TWELVE, THIRTEEN, FOURTEEN, FIFTEEN, SIXTEEN, SEVENTEEN, TWENTY, TWENTY-ONE, TWENTY-TWO, TWENTY-THREE, TWENTY-FOUR, TWENTY-FIVE, TWENTY-SIX, TWENTY-SEVEN, TWENTY-EIGHT, TWENTY-NINE, THIRTY-TWO WITH ALVEOLOPLASTY;  Surgeon: Ocie Doyne, DDS;  Location: [facility] OR;  Service: Oral Surgery;  Lat   POLYPECTOMY  08/19/2022   Procedure: POLYPECTOMY;  Surgeon: Lanelle Bal, DO;  Location: [facility];  Service: Endoscopy;;   RADIOLOGY WITH ANESTHESIA N/A 11/18/2015   Procedure: RADIOLOGY WITH ANESTHESIA;  Surgeon: Julieanne Cotton, MD;  Location: [facility] OR;  Service: Radiology;  Laterality: N/A;   Removal foreign body right shoulder     Right rotator cuff repair     TEE WITHOUT CARDIOVERSION N/A 11/21/2015   Procedure: TRANSESOPHAGEAL ECHOCARDIOGRAM (TEE);  Surgeon: Laurey Morale, MD;  Location[facility] ENDOSCOPY;  Service: Cardiovascular;  Laterality: N/A;   TOOTH EXTRACTION N/A 01/24/2018   Procedure: SUTURE ORAL WOUND;  Surgeon: Ocie Doyne, DDS;  Location: [facility] OR;  Service: Oral Surgery;  Laterality: N/A;     reports that he has been smoking cigarettes. He has  a 15.00 pack-year smoking history. He has never used smokeless tobacco. He reports that he does not currently use alcohol. He reports current drug use. Drug: Marijuana.  Allergies  Allergen Reactions   Ibuprofen Other (See Comments)    Avoid per PCP   Oxcarbazepine Other (See Comments)    Patient goes out of right state of mind.     Family History  Problem Relation Age of Onset   Diabetes Mother    Hypertension Mother    Drug abuse  Mother    Hyperlipidemia Mother    Diabetes Father    Hypertension Father    Hyperlipidemia Father    Diabetes Brother    Hypertension Brother     Prior to Admission medications   Medication Sig Start Date End Date Taking? Authorizing Provider  amLODipine (NORVASC) 10 MG tablet Take 1 tablet (10 mg total) by mouth daily. 01/19/16   Kirsteins, Victorino Sparrow, MD  aspirin 325 MG tablet Take 1 tablet (325 mg total) by mouth daily. 12/20/15   Love, Evlyn Kanner, PA-C  cloNIDine (CATAPRES - DOSED IN MG/24 HR) 0.3 mg/24hr patch Place 1 patch (0.3 mg total) onto the skin once a week. Next change on monday Patient taking differently: Place 0.3 mg onto the skin every Wednesday. Next change on monday 12/20/15   Love, Evlyn Kanner, PA-C  diphenhydramine-acetaminophen (TYLENOL PM) 25-500 MG TABS tablet Take 1-2 tablets by mouth at bedtime as needed (for sleep). Depends on insomnia if takes 1-2 tablets    [provider]  empagliflozin (JARDIANCE) 25 MG TABS tablet Take 25 mg by mouth daily.     [provider]  escitalopram (LEXAPRO) 10 MG tablet Take 10 mg by mouth daily.    [provider]  fenofibrate (TRICOR) 145 MG tablet Take 145 mg by mouth daily. 04/10/22   [provider]  hydrOXYzine (ATARAX) 25 MG tablet Take 25 mg by mouth every 8 (eight) hours as needed for anxiety. 04/17/22   [provider]  labetalol (NORMODYNE) 300 MG tablet Take 300 mg by mouth 2 (two) times daily. 03/08/22   [provider]  lisinopril (ZESTRIL) 10 MG tablet Take 10 mg by mouth daily. 06/18/22   [provider]  metFORMIN (GLUCOPHAGE) 500 MG tablet Take 1 tablet (500 mg total) by mouth 2 (two) times daily with a meal. 03/18/17   Lavera Guise, MD  nicotine (NICODERM CQ - DOSED IN MG/24 HOURS) 14 mg/24hr patch Place 14 mg onto the skin daily. 05/10/22   [provider]  ondansetron (ZOFRAN) 4 MG tablet Take 1 tablet (4 mg total) by mouth every 6 (six) hours as needed for  nausea. 07/20/22   Sherryll Burger, Pratik D, DO  oxyCODONE (OXY IR/ROXICODONE) 5 MG immediate release tablet Take 5 mg by mouth every 8 (eight) hours. 06/28/22   [provider]  pantoprazole (PROTONIX) 40 MG tablet Take 1 tablet (40 mg total) by mouth at bedtime. 01/19/16   Kirsteins, Victorino Sparrow, MD  traZODone (DESYREL) 100 MG tablet Take 1 tablet (100 mg total) by mouth at bedtime. 01/19/16   Kirsteins, Victorino Sparrow, MD  triamcinolone cream (KENALOG) 0.1 % Apply 1 application. topically daily.    [provider]  VASCEPA 1 g capsule Take 2 g by mouth 2 (two) times daily. 03/12/22   [provider]    Physical Exam: Vitals:   06/18/23 1930 06/18/23 1945 06/18/23 2009 06/18/23 2012  BP: (!) 136/100 (!) 167/97  (!) 177/99  Pulse: 65  66  64  Resp: 16 17  18   Temp: 98.2 F (36.8 C)   97.6 F (36.4 C)  TempSrc:      SpO2: 96% 95%  100%  Weight:   77.2 kg   Height:        Constitutional: NAD, calm, comfortable Vitals:   06/18/23 1930 06/18/23 1945 06/18/23 2009 06/18/23 2012  BP: (!) 136/100 (!) 167/97  (!) 177/99  Pulse: 65 66  64  Resp: 16 17  18   Temp: 98.2 F (36.8 C)   97.6 F (36.4 C)  TempSrc:      SpO2: 96% 95%  100%  Weight:   77.2 kg   Height:       Eyes: PERRL, lids and conjunctivae normal ENMT: Mucous membranes are moist.  Neck: normal, supple, no masses, no thyromegaly Respiratory: clear to auscultation bilaterally, no wheezing, no crackles. Normal respiratory effort. No accessory muscle use.  Cardiovascular: Mild chest wall tenderness to palpation left chest regular rate and rhythm, no murmurs / rubs / gallops. No extremity edema. 2+ pedal pulses.   Abdomen: no tenderness, no masses palpated. No hepatosplenomegaly. Bowel sounds positive.  Musculoskeletal: no clubbing / cyanosis. No joint deformity upper and lower extremities. Good ROM, no contractures.  Skin: no rashes, lesions, ulcers. No induration Neurologic: No Facial asymmetry, speech fluent.  5 of 5  strength of bilateral upper extremity, 5 of 5 strength to right lower extremity.  0 out of 5 strength to left lower extremity-from prior stroke. Psychiatric: Normal judgment and insight. Alert and oriented x 3. Normal mood.   Labs on Admission: I have personally reviewed following labs and imaging studies  CBC: Recent Labs  Lab 06/18/23 1252  WBC 5.9  HGB 13.0  HCT 40.5  MCV 94.0  PLT 289   Basic Metabolic Panel: Recent Labs  Lab 06/18/23 1252  NA 140  K 4.5  CL 107  CO2 24  GLUCOSE 98  BUN 23*  CREATININE 2.34*  CALCIUM 9.4    Radiological Exams on Admission: DG Chest 2 View  Result Date: 06/18/2023 CLINICAL DATA:  Rib pain. EXAM: CHEST - 2 VIEW COMPARISON:  07/19/2022. FINDINGS: Low lung volumes accentuate the pulmonary vasculature and cardiomediastinal silhouette. No consolidation or pulmonary edema. No pleural effusion or pneumothorax. No displaced rib fracture is seen. IMPRESSION: Low lung volumes without evidence of acute cardiopulmonary disease. No displaced rib fracture is seen. Electronically Signed   By: Orvan Falconer M.D.   On: 06/18/2023 13:01    EKG: Independently reviewed.  Sinus rhythm, rate 75, QTc 432.  Diffuse T wave abnormalities to lead to 3 aVF, V4 through V6.  EKG abnormalities to 1 and aVL are old and unchanged from prior.  Assessment/Plan Principal Problem:   Acute kidney injury superimposed on chronic kidney disease (HCC) Active Problems:   Chest pain   Fall at home, initial encounter   Diabetes mellitus (HCC)   Tobacco abuse   Stroke (cerebrum) (HCC)   HTN (hypertension)   OSA (obstructive sleep apnea)   Assessment and Plan: * Acute kidney injury superimposed on chronic kidney disease (HCC) Creatinine 2.34, baseline 1.4-1.8.  CKD 3 A. Denies GI losses, has had good oral intake.  Reports diarrhea a week ago that has resolved. Home medications include lisinopril 10mg  daily - given, cont N/s 100cc/hr x 15hrs -Hold lisinopril  Fall  at home, initial encounter Fall off wheelchair, with subsequent chest pain 4 days ago.  Chest x-ray negative for fracture.  Chest  pain Musculoskeletal pain secondary to trauma-fall from wheelchair.  Chest tender to palpation.  Chest x-ray without rib fracture.  EKG with diffuse T wave inversions in lead to 3 aVF V4 through V6.  Changes to lead I and aVL are old and unchanged from prior.  Ongoing tobacco abuse. -Troponin 38 > 37 -Repeat EKG in a.m. -Resume aspirin, he is not on statin.  Diabetes mellitus (HCC) - HgbA1c -Hold metformin, Jardiance - SSI- M  HTN (hypertension) Systolic 130s to 409W.  Patient unable to confirm medications he takes.   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  Ambulates with wheelchair. -Resume aspirin -No statin on med list.  Tobacco abuse Smokes half pack of cigarettes daily.  Counseled to quit. -Nicotine patch   DVT prophylaxis: Lovenox Code Status: Full-confirmed with patient at bedside Family Communication: None at bedside Disposition Plan: ~ 2days Consults called: None Admission status:  Obs tele    Author: Onnie Boer, MD 06/18/2023 9:05 PM  For on call review [email or web address].

## 2023-06-19 ENCOUNTER — Observation Stay (HOSPITAL_BASED_OUTPATIENT_CLINIC_OR_DEPARTMENT_OTHER): Payer: Medicare Other

## 2023-06-19 ENCOUNTER — Other Ambulatory Visit (HOSPITAL_COMMUNITY): Payer: Self-pay | Admitting: *Deleted

## 2023-06-19 DIAGNOSIS — Z720 Tobacco use: Secondary | ICD-10-CM | POA: Diagnosis not present

## 2023-06-19 DIAGNOSIS — R2689 Other abnormalities of gait and mobility: Secondary | ICD-10-CM | POA: Diagnosis not present

## 2023-06-19 DIAGNOSIS — Z8673 Personal history of transient ischemic attack (TIA), and cerebral infarction without residual deficits: Secondary | ICD-10-CM | POA: Diagnosis not present

## 2023-06-19 DIAGNOSIS — R079 Chest pain, unspecified: Secondary | ICD-10-CM | POA: Diagnosis not present

## 2023-06-19 DIAGNOSIS — R0789 Other chest pain: Secondary | ICD-10-CM | POA: Diagnosis not present

## 2023-06-19 DIAGNOSIS — N1831 Chronic kidney disease, stage 3a: Secondary | ICD-10-CM | POA: Diagnosis not present

## 2023-06-19 DIAGNOSIS — N183 Chronic kidney disease, stage 3 unspecified: Secondary | ICD-10-CM | POA: Diagnosis not present

## 2023-06-19 DIAGNOSIS — R778 Other specified abnormalities of plasma proteins: Secondary | ICD-10-CM | POA: Diagnosis not present

## 2023-06-19 DIAGNOSIS — E1122 Type 2 diabetes mellitus with diabetic chronic kidney disease: Secondary | ICD-10-CM | POA: Diagnosis not present

## 2023-06-19 DIAGNOSIS — N179 Acute kidney failure, unspecified: Secondary | ICD-10-CM | POA: Diagnosis not present

## 2023-06-19 DIAGNOSIS — I10 Essential (primary) hypertension: Secondary | ICD-10-CM | POA: Diagnosis not present

## 2023-06-19 DIAGNOSIS — N189 Chronic kidney disease, unspecified: Secondary | ICD-10-CM | POA: Diagnosis not present

## 2023-06-19 LAB — BASIC METABOLIC PANEL
Anion gap: 6 (ref 5–15)
BUN: 23 mg/dL — ABNORMAL HIGH (ref 6–20)
CO2: 25 mmol/L (ref 22–32)
Calcium: 8.9 mg/dL (ref 8.9–10.3)
Chloride: 106 mmol/L (ref 98–111)
Creatinine, Ser: 1.86 mg/dL — ABNORMAL HIGH (ref 0.61–1.24)
GFR, Estimated: 42 mL/min — ABNORMAL LOW (ref >60)
Glucose, Bld: 80 mg/dL (ref 70–99)
Potassium: 4.1 mmol/L (ref 3.5–5.1)
Sodium: 137 mmol/L (ref 135–145)

## 2023-06-19 LAB — ECHOCARDIOGRAM COMPLETE
Area-P 1/2: 2.56 cm2
Calc EF: 52 %
Height: 65 in
S' Lateral: 3.35 cm
Single Plane A2C EF: 50.6 %
Single Plane A4C EF: 52.6 %
Weight: 2723.12 oz

## 2023-06-19 LAB — HIV ANTIBODY (ROUTINE TESTING W REFLEX): HIV Screen 4th Generation wRfx: NONREACTIVE

## 2023-06-19 LAB — GLUCOSE, CAPILLARY
Glucose-Capillary: 89 mg/dL (ref 70–99)
Glucose-Capillary: 113 mg/dL — ABNORMAL HIGH (ref 70–99)

## 2023-06-19 MED ORDER — SODIUM CHLORIDE 0.9 % IV SOLN: INTRAVENOUS | Status: DC

## 2023-06-19 MED ORDER — LABETALOL HCL 200 MG PO TABS
300.0000 mg | ORAL_TABLET | Freq: Two times a day (BID) | ORAL | Status: DC
  Administered 2023-06-19: 300 mg via ORAL
  Filled 2023-06-19: qty 2

## 2023-06-19 NOTE — Consult Note (Addendum)
Cardiology Consultation   Patient ID: Taylor Obrien MRN: [id number]; DOB: 1966-09-25  Admit date: 06/18/2023 Date of Consult: 06/19/2023  PCP:  Renaldo Harrison, NP   [facility] Providers Cardiologist: Previously evaluated by Dr. Diona Browner in 2013 with no outpatient follow-up in the interim  Patient Profile:   Taylor Obrien is a 57 y.o. male with a hx of HTN, Type 2 DM, Stage 3 CKD, OSA (intolerant to CPAP), prior CVA, tobacco use and prior pancreatitis (felt to be due to alcohol use) who is being seen 06/19/2023 for the evaluation of chest pain and elevated troponin values at the request of Dr. Arbutus Leas.  History of Present Illness:   Taylor Obrien presented to [facility] ED on 06/18/2023 for evaluation of chest pain. Reported falling out of his wheelchair 4 days prior and having pain along his left rib cage. In talking with the patient today, he reports being in his normal state of health until he fell out of his wheelchair several days ago. Reports he was wheeling himself down a hill and lost control of his wheelchair which led to falling out of his chair and rolling on the ground. Had discomfort along his left chest at that time as that is the side he landed on. Reports his discomfort has been intermittent since and is typically worse with inspiration or coughing. No association with exertion but his activity level is limited as he is mostly wheelchair-bound.  He denies any recent dyspnea on exertion, orthopnea, PND or pitting edema. Reports he previously consumed alcohol but quit after his admission for pancreatitis last year.  Initial labs showed WBC 5.9, Hgb 13.0, platelets 289, Na+ 140, K+ 4.5 and creatinine 2.34 (baseline 1.4-1.6). Initial and repeat Hs Troponin flat at 38 and 37. CXR showed low lung volumes and no evidence of rib fractures. EKG showed normal sinus rhythm, heart rate 75 and LVH with TWI along the inferior and lateral leads which is overall similar to prior tracings and  likely secondary to repolarization abnormalities.  He was admitted for further management of his AKI and started on IV fluids with Lisinopril, Metformin and Jardiance being held. Repeat labs this AM show creatinine is starting to improve, now at 1.86.   Past Medical History:  Diagnosis Date   Acute alcoholic pancreatitis    Arthritis    Chronic pain    Depression    Diabetes mellitus without complication (HCC)    Essential hypertension, benign    GERD (gastroesophageal reflux disease)    Headache    HTN (hypertension) 11/27/2015   Hyperlipidemia    Sleep apnea    could not tolerate CPAP   Stroke (HCC)    2016    Past Surgical History:  Procedure Laterality Date   BIOPSY  08/19/2022   Procedure: BIOPSY;  Surgeon: Lanelle Bal, DO;  [facility]: [facility];  Service: Endoscopy;;   CLOSED REDUCTION MANDIBULAR FRACTURE W/ ARCH BARS     + multiple extractions   COLONOSCOPY WITH PROPOFOL N/A 08/19/2022   Procedure: COLONOSCOPY WITH PROPOFOL;  Surgeon: Lanelle Bal, DO;  Location: [facility];  Service: Endoscopy;  Laterality: N/A;  9:15am   Condyloma resection     ESOPHAGOGASTRODUODENOSCOPY (EGD) WITH PROPOFOL N/A 08/19/2022   Procedure: ESOPHAGOGASTRODUODENOSCOPY (EGD) WITH PROPOFOL;  Surgeon: Lanelle Bal, DO;  [facility];  Service: Endoscopy;  Laterality: N/A;   MULTIPLE EXTRACTIONS WITH ALVEOLOPLASTY Bilateral 01/23/2018   Procedure: DENTAL EXTRACTION OF TEETH NUMBER ONE, TWO,  THREE, FOUR, FIVE, SIX, SEVEN, EIGHT, NINE, TEN, ELEVEN, TWELVE, THIRTEEN, FOURTEEN, FIFTEEN, SIXTEEN, SEVENTEEN, TWENTY, TWENTY-ONE, TWENTY-TWO, TWENTY-THREE, TWENTY-FOUR, TWENTY-FIVE, TWENTY-SIX, TWENTY-SEVEN, TWENTY-EIGHT, TWENTY-NINE, THIRTY-TWO WITH ALVEOLOPLASTY;  Surgeon: Ocie Doyne, DDS;  Location: [facility] OR;  Service: Oral Surgery;  Lat   POLYPECTOMY  08/19/2022   Procedure: POLYPECTOMY;  Surgeon: Lanelle Bal, DO;  Location: [facility];  Service: Endoscopy;;    RADIOLOGY WITH ANESTHESIA N/A 11/18/2015   Procedure: RADIOLOGY WITH ANESTHESIA;  Surgeon: Julieanne Cotton, MD;  Location: [facility] OR;  Service: Radiology;  Laterality: N/A;   Removal foreign body right shoulder     Right rotator cuff repair     TEE WITHOUT CARDIOVERSION N/A 11/21/2015   Procedure: TRANSESOPHAGEAL ECHOCARDIOGRAM (TEE);  Surgeon: Laurey Morale, MD;  Location: [facility] ENDOSCOPY;  Service: Cardiovascular;  Laterality: N/A;   TOOTH EXTRACTION N/A 01/24/2018   Procedure: SUTURE ORAL WOUND;  Surgeon: Ocie Doyne, DDS;  Location: [facility] OR;  Service: Oral Surgery;  Laterality: N/A;     Home Medications:  Prior to Admission medications   Medication Sig Start Date End Date Taking? Authorizing Provider  amLODipine (NORVASC) 10 MG tablet Take 1 tablet (10 mg total) by mouth daily. 01/19/16   Kirsteins, Victorino Sparrow, MD  aspirin 325 MG tablet Take 1 tablet (325 mg total) by mouth daily. 12/20/15   Love, Evlyn Kanner, PA-C  cloNIDine (CATAPRES - DOSED IN MG/24 HR) 0.3 mg/24hr patch Place 1 patch (0.3 mg total) onto the skin once a week. Next change on monday Patient taking differently: Place 0.3 mg onto the skin every Wednesday. Next change on monday 12/20/15   Love, Evlyn Kanner, PA-C  diphenhydramine-acetaminophen (TYLENOL PM) 25-500 MG TABS tablet Take 1-2 tablets by mouth at bedtime as needed (for sleep). Depends on insomnia if takes 1-2 tablets    [provider]  empagliflozin (JARDIANCE) 25 MG TABS tablet Take 25 mg by mouth daily.     [provider]  escitalopram (LEXAPRO) 10 MG tablet Take 10 mg by mouth daily.    [provider]  fenofibrate (TRICOR) 145 MG tablet Take 145 mg by mouth daily. 04/10/22   [provider]  hydrOXYzine (ATARAX) 25 MG tablet Take 25 mg by mouth every 8 (eight) hours as needed for anxiety. 04/17/22   [provider]  labetalol (NORMODYNE) 300 MG tablet Take 300 mg by mouth 2 (two) times daily. 03/08/22   [provider]   lisinopril (ZESTRIL) 10 MG tablet Take 10 mg by mouth daily. 06/18/22   [provider]  metFORMIN (GLUCOPHAGE) 500 MG tablet Take 1 tablet (500 mg total) by mouth 2 (two) times daily with a meal. 03/18/17   Lavera Guise, MD  nicotine (NICODERM CQ - DOSED IN MG/24 HOURS) 14 mg/24hr patch Place 14 mg onto the skin daily. 05/10/22   [provider]  ondansetron (ZOFRAN) 4 MG tablet Take 1 tablet (4 mg total) by mouth every 6 (six) hours as needed for nausea. 07/20/22   Sherryll Burger, Pratik D, DO  oxyCODONE (OXY IR/ROXICODONE) 5 MG immediate release tablet Take 5 mg by mouth every 8 (eight) hours. 06/28/22   [provider]  pantoprazole (PROTONIX) 40 MG tablet Take 1 tablet (40 mg total) by mouth at bedtime. 01/19/16   Kirsteins, Victorino Sparrow, MD  traZODone (DESYREL) 100 MG tablet Take 1 tablet (100 mg total) by mouth at bedtime. 01/19/16   Kirsteins, Victorino Sparrow, MD  triamcinolone cream (KENALOG) 0.1 % Apply 1 application. topically daily.  [provider]  VASCEPA 1 g capsule Take 2 g by mouth 2 (two) times daily. 03/12/22   [provider]    Inpatient Medications: Scheduled Meds:  amLODipine  10 mg Oral Daily   aspirin  325 mg Oral Daily   cloNIDine  0.3 mg Transdermal Weekly   enoxaparin (LOVENOX) injection  40 mg Subcutaneous Q24H   insulin aspart  0-5 Units Subcutaneous QHS   insulin aspart  0-9 Units Subcutaneous TID WC   lidocaine  1 patch Transdermal Q24H   nicotine  14 mg Transdermal Daily   Continuous Infusions:  sodium chloride     PRN Meds: acetaminophen **OR** acetaminophen, ondansetron **OR** ondansetron (ZOFRAN) IV, oxyCODONE, polyethylene glycol  Allergies:    Allergies  Allergen Reactions   Ibuprofen Other (See Comments)    Avoid per PCP   Oxcarbazepine Other (See Comments)    Patient goes out of right state of mind.     Social History:   Social History   Socioeconomic History   Marital status: Married    Spouse name: Not on file    Number of children: Not on file   Years of education: Not on file   Highest education level: Not on file  Occupational History   Not on file  Tobacco Use   Smoking status: Every Day    Current packs/day: 0.50    Average packs/day: 0.5 packs/day for 30.0 years (15.0 ttl pk-yrs)    Types: Cigarettes   Smokeless tobacco: Never  Vaping Use   Vaping status: Never Used  Substance and Sexual Activity   Alcohol use: Not Currently    Comment: 2 40 oz daily. 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 Stopped liquor 5 years ago.states no etoh since 03/2022   Drug use: Yes    Types: Marijuana    Comment: occassionally   Sexual activity: Yes    Birth control/protection: None  Other Topics Concern   Not on file  Social History Narrative   Not on file   Social Determinants of Health   Financial Resource Strain: Not on file  Food Insecurity: Food Insecurity Present (06/18/2023)   Hunger Vital Sign    Worried About Running Out of Food in the Last Year: Sometimes true    Ran Out of Food in the Last Year: Sometimes true  Transportation Needs: Unmet Transportation Needs (06/18/2023)   PRAPARE - Administrator, Civil Service (Medical): Yes    Lack of Transportation (Non-Medical): Yes  Physical Activity: Not on file  Stress: Not on file  Social Connections: Not on file  Intimate Partner Violence: Not At Risk (06/18/2023)   Humiliation, Afraid, Rape, and Kick questionnaire    Fear of Current or Ex-Partner: No    Emotionally Abused: No    Physically Abused: No    Sexually Abused: No    Family History:    Family History  Problem Relation Age of Onset   Diabetes Mother    Hypertension Mother    Drug abuse Mother    Hyperlipidemia Mother    Diabetes Father    Hypertension Father    Hyperlipidemia Father    Diabetes Brother    Hypertension Brother      ROS:  Please see the history of present illness. All other ROS reviewed and negative.     Physical Exam/Data:   Vitals:    06/18/23 2009 06/18/23 2012 06/19/23 0004 06/19/23 0337  BP:  (!) 177/99 (!) 135/92 (!) 162/95  Pulse:  64 70 73  Resp:  18 18 18   Temp:  97.6 F (36.4 C) 98.1 F (36.7 C) 98.8 F (37.1 C)  TempSrc:    Oral  SpO2:  100% 97% 100%  Weight: 77.2 kg     Height:        Intake/Output Summary (Last 24 hours) at 06/19/2023 0754 Last data filed at 06/19/2023 0600 Gross per 24 hour  Intake 1056.67 ml  Output 300 ml  Net 756.67 ml      06/18/2023    8:09 PM 06/18/2023   12:31 PM 08/15/2022    9:23 AM  Last 3 Weights  Weight (lbs) 170 lb 3.1 oz 205 lb 205 lb  Weight (kg) 77.2 kg 92.987 kg 92.987 kg     Body mass index is 28.32 kg/m.  General: Pleasant male appearing in no acute distress. HEENT: normal Neck: no JVD Vascular: No carotid bruits; Distal pulses 2+ bilaterally Cardiac:  normal S1, S2; RRR; no murmur  Lungs:  clear to auscultation bilaterally, no wheezing, rhonchi or rales  Abd: soft, nontender, no hepatomegaly  Ext: no pitting edema Musculoskeletal:  No deformities, BUE and BLE strength normal and equal Skin: warm and dry  Psych:  Normal affect   EKG:  The EKG was personally reviewed and demonstrates: NSR, HR 75 and LVH with TWI along the inferior and lateral leads which is overall similar to prior tracings and likely secondary to repolarization abnormalities.  Telemetry:  Telemetry was personally reviewed and demonstrates: NSR, HR in 60's to 70's with occasional PVC's.   Relevant CV Studies:  Echocardiogram: 11/2015 Study Conclusions   - Left ventricle: The cavity size was normal. There was moderate    concentric hypertrophy. Systolic function was normal. The    estimated ejection fraction was in the range of 55% to 60%. There    is akinesis of the basalinferior myocardium. Features are    consistent with a pseudonormal left ventricular filling pattern,    with concomitant abnormal relaxation and increased filling    pressure (grade 2 diastolic dysfunction).  Doppler parameters are    consistent with high ventricular filling pressure.  - Left atrium: The atrium was severely dilated.   Laboratory Data:  High Sensitivity Troponin:   Recent Labs  Lab 06/18/23 1252 06/18/23 1512  TROPONINIHS 38* 37*     Chemistry Recent Labs  Lab 06/18/23 1252 06/19/23 0427  NA 140 137  K 4.5 4.1  CL 107 106  CO2 24 25  GLUCOSE 98 80  BUN 23* 23*  CREATININE 2.34* 1.86*  CALCIUM 9.4 8.9  GFRNONAA 32* 42*  ANIONGAP 9 6    Hematology Recent Labs  Lab 06/18/23 1252  WBC 5.9  RBC 4.31  HGB 13.0  HCT 40.5  MCV 94.0  MCH 30.2  MCHC 32.1  RDW 16.1*  PLT 289   Radiology/Studies:  DG Chest 2 View  Result Date: 06/18/2023 CLINICAL DATA:  Rib pain. EXAM: CHEST - 2 VIEW COMPARISON:  07/19/2022. FINDINGS: Low lung volumes accentuate the pulmonary vasculature and cardiomediastinal silhouette. No consolidation or pulmonary edema. No pleural effusion or pneumothorax. No displaced rib fracture is seen. IMPRESSION: Low lung volumes without evidence of acute cardiopulmonary disease. No displaced rib fracture is seen. Electronically Signed   By: Orvan Falconer M.D.   On: 06/18/2023 13:01     Assessment and Plan:   1. Atypical Chest Pain/Elevated Troponin Values - His chest pain is most consistent with MSK pain as he landed on his  left-side after falling out of his wheelchair. Pain is worse with inspiration and coughing. Seems atypical for a cardiac etiology.  - Hs Troponin values are mildly elevated at 38 and 37 but suspect this is secondary to his elevated BP and AKI. Not consistent with ACS. EKG is similar to prior tracings as this shows LVH with repol abnormalities. An echocardiogram has already been ordered by the admitting team. If this is reassuring, would not anticipate further cardiac testing this admission.  2. HTN - BP elevated at 162/108 on most recent check. Remains on a Clonidine patch and just received Amlodipine 10mg  daily. He is on  Labetalol 300mg  BID prior to admission and will order. Lisinopril currently held given his AKI.   3. Acute on Chronic Stage 3 CKD - Baseline creatinine 1.4 - 1.6. At 2.34 on admission and improving to 1.86 today with IV fluids.   4. Type 2 DM - Hgb A1c was at 6.1 when checked in 01/2023 by review of [facility] DXA. Metformin and Jardiance currently held. On SSI. Management per the admitting team.   5. History of CVA - He is on ASA 325mg  daily. Suspect this could be reduced to 81mg  daily prior to discharge. Would also benefit from statin therapy as LDL was at 86 in 01/2023. On Vascepa and Fenofibrate so perhaps intolerant to statins in the past. Can defer to his PCP.     For questions or updates, please contact [facility] Please consult [email or web address] for contact info under    Signed, Ellsworth Lennox, PA-C  06/19/2023 7:54 AM   Attending note:  Patient seen and examined, records reviewed and case discussed with Strader PA-C.  I agree with her above findings.  Cardiology consulted given minimal high-sensitivity troponin I elevation, flat pattern in the 30s and chronically abnormal ECG.  Clinical scenario is that Mr. Coward lost control of his wheelchair on a downward incline, falling out and tumbling on the ground  He subsequently had left-sided rib pain, pleuritic and tender suggesting musculoskeletal etiology.  Chest x-ray does not demonstrate any displaced rib fracture and no acute cardiac abnormalities.  At baseline patient does not report any exertional chest discomfort to suggest angina.  He uses a wheelchair following prior stroke.  On examination this morning he appears comfortable, eating breakfast.  Rib soreness somewhat improved.  Afebrile, heart rate continues in the 70s to 80s in sinus rhythm by telemetry.  Hypertensive.  Lungs are clear without crackles or egophony.  Cardiac exam with RRR, no late rhythm, or gallop, no significant murmur.  Left lateral chest wall soreness  with no deformity.  I reviewed his ECG which is consistent with LVH and repolarization abnormalities, ST-T wave changes are chronic, leftward axis as well consistent with left anterior fascicular block.  Pertinent lab work includes potassium 4.1, BUN 23, creatinine 1.86 down from 2.34, high-sensitivity troponin I levels flat in the 30s, hemoglobin 13.0, platelets 289.  Left-sided rib/thoracic discomfort following mechanical fall as described above.  ECG shows chronic abnormalities in the setting of LVH with repolarization and left anterior fascicular block.  Minimal, flat elevation in high-sensitivity troponin I is not consistent with ACS particularly given clinical presentation.  Likely elevated in the setting of renal insufficiency and hypertension, would also very much doubt cardiac contusion.  Echocardiogram already ordered by admitting team.  Presuming there are no substantial abnormalities on this study to require further workup, no additional cardiac testing planned at this time.  In that case he should follow-up  with PCP as before.  [name], M.D., F.A.C.C.

## 2023-06-19 NOTE — Progress Notes (Signed)
*  PRELIMINARY RESULTS* Echocardiogram 2D Echocardiogram has been performed.  Stacey Drain 06/19/2023, 12:56 PM

## 2023-06-19 NOTE — TOC Initial Note (Signed)
Transition of Care [facility]) - Initial/Assessment Note    Patient Details  Name: Taylor Obrien MRN: [id number] Date of Birth: 1966-09-10  [facility] CM/SW Contact:    Karn Cassis, LCSW Phone Number: 06/19/2023, 10:45 AM  Clinical Narrative: Pt admitted due to acute kidney injury superimposed on chronic kidney disease. Pt reports he lives with his wife and children. He has a CAP aid for 8 hours a day. Pt primarily uses wheelchair. TOC received consult for medication assistance. Pt has Medicare/Medicaid. He is aware there is no additional assistance to offer. Pt expressed some difficulty affording food. His food stamps were recently cut. LCSW added food resource list to AVS. PT evaluated pt and recommend HHPT. Pt and wife agreeable with no preference on agency. Referred and accepted by Morrie Sheldon with [facility]. Will need HHPT order.                   Expected Discharge Plan: Home w Home Health Services Barriers to Discharge: Continued Medical Work up   Patient Goals and CMS Choice Patient states their goals for this hospitalization and ongoing recovery are:: return home   Choice offered to / list presented to : Patient [place] ownership interest in [facility].provided to::  (n/a)    Expected Discharge Plan and Services In-house Referral: Clinical Social Work   Post Acute Care Choice: Home Health Living arrangements for the past 2 months: Single Family Home                           HH Arranged: PT HH Agency: [facility] Date HH Agency Contacted: 06/19/23 Time HH Agency Contacted: 1045 Representative spoke with at [facility] Agency: Morrie Sheldon  Prior Living Arrangements/Services Living arrangements for the past 2 months: Single Family Home Lives with:: Spouse Patient language and need for interpreter reviewed:: Yes Do you feel safe going back to the place where you live?: Yes      Need for Family Participation in Patient Care: No  (Comment) Care giver support system in place?: Yes (comment) Current home services: DME (wheelchair, CAP aid) Criminal Activity/Legal Involvement Pertinent to Current Situation/Hospitalization: No - Comment as needed  Activities of Daily Living Home Assistive Devices/Equipment: Dentures (specify type), Eyeglasses, Grab bars in shower, Hand-held shower hose, Shower chair with back, Environmental consultant (specify type), Wheelchair ADL Screening (condition at time of admission) [name] cognitive ability adequate to safely complete daily activities?: Yes Is the patient deaf or have difficulty hearing?: No Does the patient have difficulty seeing, even when wearing glasses/contacts?: No Does the patient have difficulty concentrating, remembering, or making decisions?: No Patient able to express need for assistance with ADLs?: Yes Does the patient have difficulty dressing or bathing?: Yes Independently performs ADLs?: No Communication: Independent Dressing (OT): Needs assistance Is this a change from baseline?: Change from baseline, expected to last <3days Grooming: Independent Feeding: Independent Bathing: Needs assistance Is this a change from baseline?: Change from baseline, expected to last <3 days Toileting: Needs assistance Is this a change from baseline?: Change from baseline, expected to last <3 days In/Out Bed: Needs assistance Is this a change from baseline?: Change from baseline, expected to last <3 days Walks in Home: Needs assistance Is this a change from baseline?: Pre-admission baseline Does the patient have difficulty walking or climbing stairs?: Yes Weakness of Legs: Left Weakness of Arms/Hands: None  Permission Sought/Granted  Emotional Assessment     Affect (typically observed): Appropriate Orientation: : Oriented to Self, Oriented to Place, Oriented to  Time, Oriented to Situation      Admission diagnosis:  Chest wall pain [R07.89] AKI (acute kidney  injury) (HCC) [N17.9] Fall, initial encounter [W19.XXXA] Acute kidney injury superimposed on chronic kidney disease (HCC) [N17.9, N18.9] Patient Active Problem List   Diagnosis Date Noted   Fall at home, initial encounter 06/18/2023   Acute kidney injury superimposed on chronic kidney disease (HCC) 06/18/2023   Acute pancreatitis 07/19/2022   GERD (gastroesophageal reflux disease)    Iron deficiency anemia    Constipation    Abdominal pain 06/14/2022   Nausea and vomiting 06/14/2022   Elevated lipase 06/14/2022   Hypoalbuminemia due to protein-calorie malnutrition (HCC) 06/14/2022   Stage 3a chronic kidney disease (CKD) (HCC) 06/04/2022   Anemia    Acute pancreatitis without infection or necrosis    Transaminasemia 04/29/2022   Class 1 obesity 04/29/2022   Thrombocytosis 04/26/2022   Acute alcoholic pancreatitis 04/26/2022   Diabetes mellitus (HCC) 04/24/2022   Post-operative state 01/24/2018   Spastic hemiplegia affecting nondominant side (HCC) 06/03/2016   Dysuria    OSA (obstructive sleep apnea)    Hypokalemia    Elevated blood pressure    Hemiparesis affecting left side as late effect of stroke (HCC)    Epistaxis    HTN (hypertension) 11/27/2015   Acute renal failure superimposed on stage 3a chronic kidney disease (HCC) 11/27/2015   Hemiplegia and hemiparesis following unspecified cerebrovascular disease affecting left non-dominant side (HCC) 11/23/2015   Gait disturbance, post-stroke 11/23/2015   Cerebrovascular accident (CVA) due to occlusion of right anterior cerebral artery (HCC)    Essential hypertension    Depression    Chronic pain syndrome    ETOH abuse    Marijuana abuse    Cerebrovascular accident (CVA) due to thrombosis of right carotid artery (HCC)    Malignant hypertension    Mixed hyperlipidemia    Cerebral infarction (HCC) 11/18/2015   Stroke (cerebrum) (HCC) 11/18/2015   Chest pain 10/09/2012   Chronic back pain 10/09/2012   Tobacco abuse 10/09/2012    Hypertensive heart disease 08/31/2012   Accelerated hypertension 08/31/2012   Precordial pain 08/31/2012   PCP:  Renaldo Harrison, NP Pharmacy:   [facility] [place], [place] - [address] [place] [address] Phone: [phone] Fax: [phone]     Social Determinants of Health (SDOH) Social History: SDOH Screenings   Food Insecurity: Food Insecurity Present (06/18/2023)  Housing: High Risk (06/18/2023)  Transportation Needs: Unmet Transportation Needs (06/18/2023)  Utilities: Not At Risk (06/18/2023)  Tobacco Use: High Risk (06/18/2023)   SDOH Interventions: Food Insecurity Interventions: Inpatient TOC, Other (Comment) (Food pantry/resource list added to AVS.) Housing Interventions: Inpatient TOC, Other (Comment) (No concerns per wife.) Transportation Interventions: Inpatient TOC, Other (Comment) (Pt states he has transportation. No concerns.)   Readmission Risk Interventions    04/26/2022   11:38 AM 04/25/2022    9:38 AM  Readmission Risk Prevention Plan  Transportation Screening Complete Complete  Home Care Screening  Complete  Medication Review (RN CM)  Complete  HRI or Home Care Consult Complete   Social Work Consult for Recovery Care Planning/Counseling Complete   Palliative Care Screening Not Applicable   Medication Review Oceanographer) Complete

## 2023-06-19 NOTE — Discharge Summary (Signed)
Physician Discharge Summary   Patient: Taylor Obrien MRN: [id number] DOB: 05-24-1966  Admit date:     06/18/2023  Discharge date: 06/19/23  Discharge Physician: Onalee Hua Essa Malachi   PCP: Renaldo Harrison, NP   LEFT AGAINST MEDICAL ADVICE  Hospital Course: 57 year old male with a history of hypertension, diabetes mellitus type 2, hyperlipidemia, stroke with left hemiparesis, CKD stage III, GERD, tobacco abuse, alcoholic pancreatitis presenting with left-sided chest pain.  The patient states that he fell out of his wheelchair on 06/12/2023.  He landed on his left side.  Since then, has been complaining of left-sided chest pain and rib pain with some shortness of breath.  He states that it has been worsening over the past 2 days.  It is worse with inspiration and coughing.  He denies any fevers, chills, headache, dizziness, coughing, hemoptysis, nausea, vomiting, diarrhea, abdominal pain.  There is no dysuria or hematuria. In the ED, the patient was afebrile and hemodynamically stable with oxygen saturation 100% room air.  He states he quit drinking alcohol over 1 year ago. WBC 5.9, hemoglobin 13.0, platelets 209,000.  Sodium 137, potassium 4.1, bicarbonate 25, BUN to 23, serum creatinine 2.34.  Troponin 38>>37.  EKG shows sinus rhythm with decreased ST-T wave changes V4-V6 and I-II.  Chest x-ray was negative for pulmonary edema or infiltrates.  In the early afternoon on 06/19/23, the patient left AMA. He did not want to stay long enough for the physician to speak with him. staff attempted to get him to sign AMA form and to let him know he was responsible for anything that happened. Patient cursing and yelling that he was just leaving.   Assessment and Plan: Acute on chronic renal failure--CKD stage IIIa -Baseline creatinine 1.5-1.8 -Secondary to volume depletion -Continue IV fluids>> improving -Holding lisinopril   Chest pain/elevated troponin -Although chest pain is primarily musculoskeletal--consult  cardiology given new abnormal EKG and elevated troponins -Echocardiogram   Diabetes mellitus type 2 -04/25/2022 hemoglobin A1c 7.7 -Holding metformin, Jardiance -NovoLog sliding scale   Essential hypertension -Continue amlodipine and clonidine TTS 3   Tobacco abuse -Tobacco cessation discussed   History of right ACA stroke -Continue aspirin   Anxiety/depression -Continue Lexapro and hydroxyzine         Consultants: cardiology Procedures performed: none  Disposition:  AGAINST MEDICAL ADVICE  DISCHARGE MEDICATION: Allergies as of 06/19/2023       Reactions   Ibuprofen Other (See Comments)   Avoid per PCP   Oxcarbazepine Other (See Comments)   Patient goes out of right state of mind.         Medication List     STOP taking these medications    metFORMIN 500 MG tablet Commonly known as: GLUCOPHAGE   nicotine 14 mg/24hr patch Commonly known as: NICODERM CQ - dosed in mg/24 hours   ondansetron 4 MG tablet Commonly known as: ZOFRAN       TAKE these medications    amLODipine 10 MG tablet Commonly known as: NORVASC Take 1 tablet (10 mg total) by mouth daily.   aspirin 325 MG tablet Take 1 tablet (325 mg total) by mouth daily.   atorvastatin 80 MG tablet Commonly known as: LIPITOR Take 80 mg by mouth daily.   cloNIDine 0.3 mg/24hr patch Commonly known as: CATAPRES - Dosed in mg/24 hr Place 1 patch (0.3 mg total) onto the skin once a week. Next change on monday What changed: when to take this   diphenhydramine-acetaminophen 25-500 MG Tabs tablet Commonly known as:  TYLENOL PM Take 1-2 tablets by mouth at bedtime as needed (for sleep). Depends on insomnia if takes 1-2 tablets   empagliflozin 25 MG Tabs tablet Commonly known as: JARDIANCE Take 25 mg by mouth daily.   escitalopram 10 MG tablet Commonly known as: LEXAPRO Take 10 mg by mouth daily.   fenofibrate 145 MG tablet Commonly known as: TRICOR Take 145 mg by mouth daily.   gabapentin  100 MG capsule Commonly known as: NEURONTIN Take 100-300 mg by mouth every morning. 100mg  in am, 300mg  pm   hydrOXYzine 25 MG tablet Commonly known as: ATARAX Take 25 mg by mouth every 8 (eight) hours as needed for anxiety.   labetalol 300 MG tablet Commonly known as: NORMODYNE Take 300 mg by mouth 2 (two) times daily.   lisinopril 10 MG tablet Commonly known as: ZESTRIL Take 10 mg by mouth daily.   oxyCODONE 5 MG immediate release tablet Commonly known as: Oxy IR/ROXICODONE Take 5 mg by mouth every 8 (eight) hours.   pantoprazole 40 MG tablet Commonly known as: PROTONIX Take 1 tablet (40 mg total) by mouth at bedtime.   pregabalin 50 MG capsule Commonly known as: LYRICA Take 50 mg by mouth 2 (two) times daily.   tiZANidine 2 MG tablet Commonly known as: ZANAFLEX Take 2 mg by mouth 2 (two) times daily.   traZODone 100 MG tablet Commonly known as: DESYREL Take 1 tablet (100 mg total) by mouth at bedtime.   triamcinolone cream 0.1 % Commonly known as: KENALOG Apply 1 application. topically daily.   Vascepa 1 g capsule Generic drug: icosapent Ethyl Take 2 g by mouth 2 (two) times daily.        Follow-up Information     Renaldo Harrison, NP. Schedule an appointment as soon as possible for a visit .   Specialty: Nurse Practitioner Contact information: [address] [place] [address] [phone]         [facility] Follow up.   Why: Will contact you to schedule home health visits.               Discharge Exam: Filed Weights   06/18/23 1231 06/18/23 2009  Weight: 93 kg 77.2 kg   HEENT:  [place]/AT, No thrush, no icterus CV:  RRR, no rub, no S3, no S4 Lung:  CTA, no wheeze, no rhonchi Abd:  soft/+BS, NT Ext:  No edema, no lymphangitis, no synovitis, no rash  Condition at discharge: LEFT AGAINST MEDICAL ADVICE  The results of significant diagnostics from this hospitalization (including imaging, microbiology, ancillary and  laboratory) are listed below for reference.   Imaging Studies: ECHOCARDIOGRAM COMPLETE  Result Date: 06/19/2023    ECHOCARDIOGRAM REPORT   Patient Name:   Taylor Obrien Date of Exam: 06/19/2023 Medical Rec #:  [id number]     Height:       65.0 in Accession #:    [id number]    Weight:       170.2 lb Date of Birth:  13-Jul-1966     BSA:          1.847 m Patient Age:    56 years      BP:           162/108 mmHg Patient Gender: M             HR:           76 bpm. [facility] Procedure: 2D Echo, Cardiac Doppler and Color Doppler Indications:  Chest Pain R07.9  History:        Patient has prior history of Echocardiogram examinations, most                 recent 11/21/2015. Stroke; Risk Factors:Hypertension, Diabetes,                 Dyslipidemia and Current Smoker. Hx of ETOH abuse.  Sonographer:    Celesta Gentile RCS Referring Phys: [phone] [place] IMPRESSIONS  1. Left ventricular ejection fraction, by estimation, is 50 to 55%. The left ventricle has low normal function. The left ventricle demonstrates regional wall motion abnormalities (see scoring diagram/findings for description). There is moderate asymmetric left ventricular hypertrophy of the basal segment. Left ventricular diastolic parameters are consistent with Grade I diastolic dysfunction (impaired relaxation).  2. Right ventricular systolic function is normal. The right ventricular size is normal.  3. Left atrial size was mild to moderately dilated.  4. The mitral valve is grossly normal. Trivial mitral valve regurgitation.  5. The aortic valve is tricuspid. Aortic valve regurgitation is not visualized.  6. The inferior vena cava is normal in size with greater than 50% respiratory variability, suggesting right atrial pressure of 3 mmHg. Comparison(s): Prior images unable to be directly viewed. Prior images reviewed side by side. LVEF low normal at 50-55%. Basal inferoposterior hypokinesis is old. FINDINGS  Left Ventricle: Left ventricular  ejection fraction, by estimation, is 50 to 55%. The left ventricle has low normal function. The left ventricle demonstrates regional wall motion abnormalities. The left ventricular internal cavity size was normal in size. There is moderate asymmetric left ventricular hypertrophy of the basal segment. Left ventricular diastolic parameters are consistent with Grade I diastolic dysfunction (impaired relaxation).  LV Wall Scoring: The basal inferolateral segment and basal inferior segment are hypokinetic. The entire anterior wall, antero-lateral wall, mid and distal lateral wall, entire septum, entire apex, and mid and distal inferior wall are normal. Right Ventricle: The right ventricular size is normal. No increase in right ventricular wall thickness. Right ventricular systolic function is normal. Left Atrium: Left atrial size was mild to moderately dilated. Right Atrium: Right atrial size was normal in size. Pericardium: There is no evidence of pericardial effusion. Mitral Valve: The mitral valve is grossly normal. Trivial mitral valve regurgitation. Tricuspid Valve: The tricuspid valve is grossly normal. Tricuspid valve regurgitation is trivial. Aortic Valve: The aortic valve is tricuspid. There is mild aortic valve annular calcification. Aortic valve regurgitation is not visualized. Pulmonic Valve: The pulmonic valve was grossly normal. Pulmonic valve regurgitation is trivial. Aorta: The aortic root is normal in size and structure. Venous: The inferior vena cava is normal in size with greater than 50% respiratory variability, suggesting right atrial pressure of 3 mmHg. IAS/Shunts: No atrial level shunt detected by color flow Doppler.  LEFT VENTRICLE PLAX 2D LVIDd:         4.30 cm      Diastology LVIDs:         3.35 cm      LV e' medial:    4.46 cm/s LV PW:         1.20 cm      LV E/e' medial:  11.7 LV IVS:        1.50 cm      LV e' lateral:   10.10 cm/s LVOT diam:     2.00 cm      LV E/e' lateral: 5.2 LV SV:  66 LV SV Index:   36 LVOT Area:     3.14 cm  LV Volumes (MOD) LV vol d, MOD A2C: 157.0 ml LV vol d, MOD A4C: 89.4 ml LV vol s, MOD A2C: 77.5 ml LV vol s, MOD A4C: 42.4 ml LV SV MOD A2C:     79.5 ml LV SV MOD A4C:     89.4 ml LV SV MOD BP:      65.2 ml RIGHT VENTRICLE RV S prime:     10.60 cm/s TAPSE (M-mode): 1.8 cm LEFT ATRIUM             Index        RIGHT ATRIUM           Index LA diam:        3.30 cm 1.79 cm/m   RA Area:     19.40 cm LA Vol (A2C):   63.1 ml 34.16 ml/m  RA Volume:   53.70 ml  29.07 ml/m LA Vol (A4C):   75.4 ml 40.82 ml/m LA Biplane Vol: 71.8 ml 38.87 ml/m  AORTIC VALVE LVOT Vmax:   113.00 cm/s LVOT Vmean:  75.100 cm/s LVOT VTI:    0.211 m  AORTA Ao Root diam: 3.50 cm MITRAL VALVE MV Area (PHT): 2.56 cm     SHUNTS MV Decel Time: 296 msec     Systemic VTI:  0.21 m MV E velocity: 52.30 cm/s   Systemic Diam: 2.00 cm MV A velocity: 102.00 cm/s MV E/A ratio:  0.51 Nona Dell MD Electronically signed by Nona Dell MD Signature Date/Time: 06/19/2023/1:58:55 PM    Final    DG Chest 2 View  Result Date: 06/18/2023 CLINICAL DATA:  Rib pain. EXAM: CHEST - 2 VIEW COMPARISON:  07/19/2022. FINDINGS: Low lung volumes accentuate the pulmonary vasculature and cardiomediastinal silhouette. No consolidation or pulmonary edema. No pleural effusion or pneumothorax. No displaced rib fracture is seen. IMPRESSION: Low lung volumes without evidence of acute cardiopulmonary disease. No displaced rib fracture is seen. Electronically Signed   By: Orvan Falconer M.D.   On: 06/18/2023 13:01    Microbiology: Results for orders placed or performed during the hospital encounter of 04/24/22  Culture, blood (Routine X 2) w Reflex to ID Panel     Status: None   Collection Time: 04/24/22 12:51 PM   Specimen: BLOOD LEFT HAND  Result Value Ref Range Status   Specimen Description BLOOD LEFT HAND  Final   Special Requests   Final    BOTTLES DRAWN AEROBIC AND ANAEROBIC Blood Culture results may not be  optimal due to an inadequate volume of blood received in culture bottles   Culture   Final    NO GROWTH 5 DAYS Performed at [facility], [address], [place], [place] [address]    Report Status 04/29/2022 FINAL  Final  Culture, blood (Routine X 2) w Reflex to ID Panel     Status: None   Collection Time: 04/24/22 12:51 PM   Specimen: BLOOD RIGHT HAND  Result Value Ref Range Status   Specimen Description BLOOD RIGHT HAND  Final   Special Requests   Final    BOTTLES DRAWN AEROBIC AND ANAEROBIC Blood Culture results may not be optimal due to an inadequate volume of blood received in culture bottles   Culture   Final    NO GROWTH 5 DAYS Performed at [facility], [address] [place], [place] [address]    Report Status 04/29/2022 FINAL  Final  Labs: CBC: Recent Labs  Lab 06/18/23 1252  WBC 5.9  HGB 13.0  HCT 40.5  MCV 94.0  PLT 289   Basic Metabolic Panel: Recent Labs  Lab 06/18/23 1252 06/19/23 0427  NA 140 137  K 4.5 4.1  CL 107 106  CO2 24 25  GLUCOSE 98 80  BUN 23* 23*  CREATININE 2.34* 1.86*  CALCIUM 9.4 8.9   Liver Function Tests: No results for input(s): "AST", "ALT", "ALKPHOS", "BILITOT", "PROT", "ALBUMIN" in the last 168 hours. CBG: Recent Labs  Lab 06/18/23 2157 06/19/23 0723 06/19/23 1129  GLUCAP 74 89 113*    Discharge time spent: greater than 30 minutes.  Signed: Catarina Hartshorn, MD [name] 06/19/2023

## 2023-06-19 NOTE — Progress Notes (Signed)
Patient left  AMA, staff attempted to get him to sign AMA form and to let him know he was responsible for anything that happened.  Patient cursing and yelling that he was just leaving. MD informed

## 2023-06-19 NOTE — Evaluation (Signed)
Physical Therapy Evaluation Patient Details Name: Taylor Obrien MRN: [id number] DOB: Sep 18, 1966 Today's Date: 06/19/2023  History of Present Illness  Taylor Obrien is a 57 y.o. male with medical history significant for  HTN, CKD 3, diabetes mellitus, OSA.  Patient presented to the ED with complaints of chest pain that started about 4 days ago.  Patient reports 4 days ago he was on his wheelchair, and he could not stop, he fell out of his wheelchair onto the left side of his chest.  He has subsequently had pain to the same side of his chest.  Pain is worse with coughing.  He smokes cigarettes.  Reports good oral intake, no vomiting. He reports diarrhea about a week ago that has resolved.   Clinical Impression  Patient demonstrates good return for bed mobility and transferring to/from his wheelchair, able to take a few bouncy steps at bedside leaning on armrest of wheelchair with most weightbearing on RLE during transfers.  Patient demonstrates good return for propelling self in wheelchair and tolerated sitting up in chair after therapy.  Plan:  Patient discharged from physical therapy to care of nursing for out of bed daily as tolerated for length of stay.       Assistance Recommended at Discharge Set up Supervision/Assistance  If plan is discharge home, recommend the following:  Can travel by private vehicle  A little help with walking and/or transfers;A little help with bathing/dressing/bathroom;Help with stairs or ramp for entrance;Assistance with cooking/housework        Equipment Recommendations None recommended by PT  Recommendations for Other Services       Functional Status Assessment Patient has had a recent decline in their functional status and/or demonstrates limited ability to make significant improvements in function in a reasonable and predictable amount of time     Precautions / Restrictions Precautions Precautions: Fall Restrictions Weight Bearing Restrictions: No       Mobility  Bed Mobility Overal bed mobility: Modified Independent                  Transfers Overall transfer level: Modified independent                 General transfer comment: good return for transferring to/from wheelchair    Ambulation/Gait   Gait Distance (Feet): 3 Feet Assistive device: None (lean on armrest of chair) Gait Pattern/deviations: Decreased step length - right, Decreased step length - left, Decreased stride length, Decreased stance time - left Gait velocity: slow     General Gait Details: limited to a few unsteady labored steps at bedside bearing weight mostly on RLE  Stairs            Wheelchair Mobility     Tilt Bed    Modified Rankin (Stroke Patients Only)       Balance Overall balance assessment: Needs assistance Sitting-balance support: Feet supported, No upper extremity supported Sitting balance-Leahy Scale: Good Sitting balance - Comments: seated at EOB   Standing balance support: During functional activity, Single extremity supported Standing balance-Leahy Scale: Poor Standing balance comment: leaning on armrest of w/c with both hands                             Pertinent Vitals/Pain Pain Assessment Pain Assessment: 0-10 Pain Score: 5  Pain Location: mostly left side of ches, some bilateral elbows Pain Descriptors / Indicators: Sore, Discomfort Pain Intervention(s): Limited activity within patient's tolerance, Monitored  during session, Repositioned    Home Living Family/patient expects to be discharged to:: Private residence Living Arrangements: Spouse/significant other;Children Available Help at Discharge: Family;Available PRN/intermittently Type of Home: House Home Access: Ramped entrance       Home Layout: One level Home Equipment: Agricultural consultant (2 wheels);Wheelchair - manual;Grab bars - tub/shower      Prior Function Prior Level of Function : Needs assist       Physical Assist :  Mobility (physical);ADLs (physical) Mobility (physical): Bed mobility;Transfers;Gait;Stairs   Mobility Comments: Modified Independent transfers, uses wheelchair for mobility, non-ambulatory ADLs Comments: assisted by family     Hand Dominance   Dominant Hand: Right    Extremity/Trunk Assessment   Upper Extremity Assessment Upper Extremity Assessment: Overall WFL for tasks assessed    Lower Extremity Assessment Lower Extremity Assessment: RLE deficits/detail;[name] deficits/detail RLE Deficits / Details: grossly -4/5 RLE Sensation: WNL RLE Coordination: WNL [name] Deficits / Details: grossly -3/5 [name] Sensation: decreased light touch [name] Coordination: decreased fine motor;decreased gross motor    Cervical / Trunk Assessment Cervical / Trunk Assessment: Normal  Communication   Communication: No difficulties  Cognition Arousal/Alertness: Awake/alert Behavior During Therapy: WFL for tasks assessed/performed Overall Cognitive Status: Within Functional Limits for tasks assessed                                          General Comments      Exercises     Assessment/Plan    PT Assessment All further PT needs can be met in the next venue of care  PT Problem List Decreased strength;Decreased activity tolerance;Decreased mobility;Decreased balance       PT Treatment Interventions      PT Goals (Current goals can be found in the Care Plan section)  Acute Rehab PT Goals Patient Stated Goal: return home with family to assist PT Goal Formulation: With patient Time For Goal Achievement: 06/19/23 Potential to Achieve Goals: Good    Frequency       Co-evaluation               AM-PAC PT "6 Clicks" Mobility  Outcome Measure Help needed turning from your back to your side while in a flat bed without using bedrails?: None Help needed moving from lying on your back to sitting on the side of a flat bed without using bedrails?: None Help needed moving to  and from a bed to a chair (including a wheelchair)?: A Little Help needed standing up from a chair using your arms (e.g., wheelchair or bedside chair)?: A Little Help needed to walk in hospital room?: A Little Help needed climbing 3-5 steps with a railing? : A Lot 6 Click Score: 19    End of Session   Activity Tolerance: Patient tolerated treatment well;Patient limited by fatigue Patient left: in chair;with call bell/phone within reach;Other (comment) (patient left in his wheelchair) Nurse Communication: Mobility status PT Visit Diagnosis: Unsteadiness on feet (R26.81);Other abnormalities of gait and mobility (R26.89);Muscle weakness (generalized) (M62.81)    Time: 4098-1191 PT Time Calculation (min) (ACUTE ONLY): 17 min   Charges:   PT Evaluation $PT Eval Low Complexity: 1 Low PT Treatments $Therapeutic Activity: 8-22 mins PT General Charges $$ ACUTE PT VISIT: 1 Visit         1:55 PM, 06/19/23 Ocie Bob, MPT Physical Therapist with [facility] 336 [phone] office [phone] mobile phone

## 2023-06-19 NOTE — Hospital Course (Addendum)
57 year old male with a history of hypertension, diabetes mellitus type 2, hyperlipidemia, stroke with left hemiparesis, CKD stage III, GERD, tobacco abuse, alcoholic pancreatitis presenting with left-sided chest pain.  The patient states that he fell out of his wheelchair on 06/12/2023.  He landed on his left side.  Since then, has been complaining of left-sided chest pain and rib pain with some shortness of breath.  He states that it has been worsening over the past 2 days.  It is worse with inspiration and coughing.  He denies any fevers, chills, headache, dizziness, coughing, hemoptysis, nausea, vomiting, diarrhea, abdominal pain.  There is no dysuria or hematuria. In the ED, the patient was afebrile and hemodynamically stable with oxygen saturation 100% room air.  He states he quit drinking alcohol over 1 year ago. WBC 5.9, hemoglobin 13.0, platelets 209,000.  Sodium 137, potassium 4.1, bicarbonate 25, BUN to 23, serum creatinine 2.34.  Troponin 38>>37.  EKG shows sinus rhythm with decreased ST-T wave changes V4-V6 and I-II.  Chest x-ray was negative for pulmonary edema or infiltrates.  In the early afternoon on 06/19/23, the patient left AMA. He did not want to stay long enough for the physician to speak with him. staff attempted to get him to sign AMA form and to let him know he was responsible for anything that happened. Patient cursing and yelling that he was just leaving.

## 2023-06-19 NOTE — Discharge Instructions (Signed)
[place] Food Resources: [employer]- [address] [place] [phone]  Provides hot meals seven days a week and twice (lunch and afternoon) on Saturdays and  Sundays. The [employer] serves lunch Monday - Friday between 11 a.m. and noon. [facility] - [address] [place] [place] [phone] Come Tuesdays and Wednesdays to sign up from 2-4 pm with I.D. and proof of address.  Distribute food every other Thursday from 9-12.  [facility] - [employer] 11:30-1:00 Blessing Pantry across from [place], behind the free clinic in [place].  It is a self-serve and the community can drop things in it. "Give if you can ... take if you  need". Anyone can give and anyone can go by. P a g e  2 [facility] - Updated April 2020  [facility] - [address] [place], [place] - [phone]  Boxes of food. You have to call the number and leave a message and someone will call  you back to set up a time to come and pick up. [employer] - https://bit.ly/3cO1Az1 Bagged lunches/breakfast - Parents can request service online and then pick up their  meals Monday-Friday from 11:30-12:00. [facility] delivers the meals each day. They sort,  combine and place meals for each family on tables for pickup so that they adhere to social  distancing.  Men in [place], [place]: [address] [place] [phone] Distribution is from 10 a.m. to 1 p.m. the 2nd and 4th Tuesdays. Clients must sign up before  the day of distribution. The [employer], [place]: [phone]  Food Pantry hours: 9 a.m.-11:45 a.m. Mondays, Wednesdays, Thursdays, and Fridays. The  pantry is closed on Tuesdays. [facility]ging, Disability [facility] Transit [facility] of [place] - [address] - [phone]  Services Offered: Meals on [employer]. Home care, at home assisted living,  volunteer services, Center for [employer], transportation [facility] [place] [phone]  Food Assistance 4th Wednesday of each month 9:30 a.m. to 12 noon First [facility] - [address], [place] [phone] Services Offered: Application and I.D are required. You must have a copy of a current bill.  Call on Tuesdays for appointment and complete application between 9:15-11:30AM.  They will purchase fresh food and you are able to pick the food up in the same afternoon. [facility] [employer]) - [phone] [address] . [phone] Services Offered: Food assistance on Tuesdays from 2-4 pm with photo I.D. and  proof of address  Sheets - lunch for kids (limited numbers daily)  [employer] - [address], [place] - [phone] Free kids-meal of hot dog (or grilled cheese), chips and small drink for any student 12 and  under while school is out. Eat in only and limit one per day. P a g e  3 [facility] Cr[facility] Resources - Updated April 2020  [employer] - [address] [place] - [phone]  Free hot dog, chips, water or juice for kids. [place]: [employer], fries and drink. [facility] [employer] Food Pantry: [address],  Assists homeless and needy [place] and their families. Open 10 a.m. to 1 p.m. Tuesdays  and Thursdays except holidays. Must show valid military ID to receive food. [place]: [facility] [employer] - Chief Executive Officer 11:30-1:00  Sheets - lunch for kids (limited numbers daily) [employer] - Free hamburger/hot  dog, fry and drink. 11am-2 pm, Monday-Friday.  Must be accompanied by an adult. [employer] -[address] [place] and go breakfast and lunch, children under 18, must be present. [employer] - https://bit.ly/3cO1Az1 Bagged lunches/breakfast - Parents can request service online and then pick up their  meals Monday-Friday from 11:30-12:00. [facility] delivers the meals each day. We sort,   combine and place meals for each family on tables for pickup so that we adhere to social  distancing.     [employer] - [address], [place] [phone] Located: Basement of [address] back entrance off [address]).  You must call the Ministry before you are able to receive services. Food assistance,  utility assistance if funds are available Technical brewer for all of [employer])  ([employer], [employer], Office manager and [employer] for [name] area only) Walk-ins with  current ID and current address verification required. Services available on Wednesdays  and Thursdays: 9:30AM-12:00PM. The [employer], [place]: [address]  Serves hot meals from noon to 12:45 p.m. Mondays through Fridays; Food Pantry is open from 12:30 p.m. to 2:30 p.m. Mondays through Fridays P a g e  4 [facility] - Updated April 2020  [facility] - [address] [place] [phone]  Application and I.D are required for services. You must have a copy of the current bill.  Call on Tuesdays for appointment and complete application between 9:15-11:30AM.  This ministry will purchase fresh food and you will pick the food up at the ministry that  afternoon. [facility] - [address], [place] [phone]

## 2023-06-19 NOTE — Progress Notes (Addendum)
PROGRESS NOTE  Taylor Obrien JXB:[id number] DOB: [birth year] DOA: 06/18/2023 PCP: Renaldo Harrison, NP  Brief History:  57 year old male with a history of hypertension, diabetes mellitus type 2, hyperlipidemia, stroke with left hemiparesis, CKD stage III, GERD, tobacco abuse, alcoholic pancreatitis presenting with left-sided chest pain.  The patient states that he fell out of his wheelchair on 06/12/2023.  He landed on his left side.  Since then, has been complaining of left-sided chest pain and rib pain with some shortness of breath.  He states that it has been worsening over the past 2 days.  It is worse with inspiration and coughing.  He denies any fevers, chills, headache, dizziness, coughing, hemoptysis, nausea, vomiting, diarrhea, abdominal pain.  There is no dysuria or hematuria. In the ED, the patient was afebrile and hemodynamically stable with oxygen saturation 100% room air.  He states he quit drinking alcohol over 1 year ago. WBC 5.9, hemoglobin 13.0, platelets 209,000.  Sodium 137, potassium 4.1, bicarbonate 25, BUN to 23, serum creatinine 2.34.  Troponin 38>>37.  EKG shows sinus rhythm with decreased ST-T wave changes V4-V6 and I-II.  Chest x-ray was negative for pulmonary edema or infiltrates.   Assessment/Plan: Acute on chronic renal failure--CKD stage IIIa -Baseline creatinine 1.5-1.8 -Secondary to volume depletion -Continue IV fluids>> improving -Holding lisinopril  Chest pain/elevated troponin -Although chest pain is primarily musculoskeletal--consult cardiology given new abnormal EKG and elevated troponins -Echocardiogram  Diabetes mellitus type 2 -04/25/2022 hemoglobin A1c 7.7 -Holding metformin, Jardiance -NovoLog sliding scale  Essential hypertension -Continue amlodipine and clonidine TTS 3  Tobacco abuse -Tobacco cessation discussed  History of right ACA stroke -Continue aspirin  Anxiety/depression -Continue Lexapro and hydroxyzine      Family  Communication: no  Family at bedside  Consultants:  cardiology  Code Status:  FULL  DVT Prophylaxis:  [place] Lovenox   Procedures: As Listed in Progress Note Above  Antibiotics: None      Subjective: Patient continues to complain of left-sided rib pain.  He denies any shortness of breath, nausea, vomiting or direct abdominal pain.  He denies any fevers or chills.  There is no coughing or hemoptysis.  Objective: Vitals:   06/18/23 2009 06/18/23 2012 06/19/23 0004 06/19/23 0337  BP:  (!) 177/99 (!) 135/92 (!) 162/95  Pulse:  64 70 73  Resp:  18 18 18   Temp:  97.6 F (36.4 C) 98.1 F (36.7 C) 98.8 F (37.1 C)  TempSrc:    Oral  SpO2:  100% 97% 100%  Weight: 77.2 kg     Height:        Intake/Output Summary (Last 24 hours) at 06/19/2023 0711 Last data filed at 06/19/2023 0600 Gross per 24 hour  Intake 1056.67 ml  Output 300 ml  Net 756.67 ml   Weight change:  Exam:  General:  Pt is alert, follows commands appropriately, not in acute distress HEENT: No icterus, No thrush, No neck mass, [place]/AT Cardiovascular: RRR, S1/S2, no rubs, no gallops; left-sided ribs tender to palpation. Respiratory: CTA bilaterally, no wheezing, no crackles, no rhonchi Abdomen: Soft/+BS, non tender, non distended, no guarding Extremities: No edema, No lymphangitis, No petechiae, No rashes, no synovitis   Data Reviewed: I have personally reviewed following labs and imaging studies Basic Metabolic Panel: Recent Labs  Lab 06/18/23 1252 06/19/23 0427  NA 140 137  K 4.5 4.1  CL 107 106  CO2 24 25  GLUCOSE 98 80  BUN 23* 23*  CREATININE 2.34* 1.86*  CALCIUM 9.4 8.9   Liver Function Tests: No results for input(s): "AST", "ALT", "ALKPHOS", "BILITOT", "PROT", "ALBUMIN" in the last 168 hours. No results for input(s): "LIPASE", "AMYLASE" in the last 168 hours. No results for input(s): "AMMONIA" in the last 168 hours. Coagulation Profile: No results for input(s): "INR", "PROTIME" in the last  168 hours. CBC: Recent Labs  Lab 06/18/23 1252  WBC 5.9  HGB 13.0  HCT 40.5  MCV 94.0  PLT 289   Cardiac Enzymes: No results for input(s): "CKTOTAL", "CKMB", "CKMBINDEX", "TROPONINI" in the last 168 hours. BNP: Invalid input(s): "POCBNP" CBG: Recent Labs  Lab 06/18/23 2157  GLUCAP 74   HbA1C: No results for input(s): "HGBA1C" in the last 72 hours. Urine analysis:    Component Value Date/Time   COLORURINE YELLOW 06/14/2022 1504   APPEARANCEUR CLEAR 06/14/2022 1504   LABSPEC 1.006 06/14/2022 1504   PHURINE 6.0 06/14/2022 1504   GLUCOSEU >=500 (A) 06/14/2022 1504   HGBUR NEGATIVE 06/14/2022 1504   BILIRUBINUR NEGATIVE 06/14/2022 1504   KETONESUR NEGATIVE 06/14/2022 1504   PROTEINUR 100 (A) 06/14/2022 1504   UROBILINOGEN 0.2 09/22/2015 0750   NITRITE NEGATIVE 06/14/2022 1504   LEUKOCYTESUR NEGATIVE 06/14/2022 1504   Sepsis Labs: @LABRCNTIP (procalcitonin:4,lacticidven:4) )No results found for this or any previous visit (from the past 240 hour(s)).   Scheduled Meds:  amLODipine  10 mg Oral Daily   aspirin  325 mg Oral Daily   cloNIDine  0.3 mg Transdermal Weekly   enoxaparin (LOVENOX) injection  40 mg Subcutaneous Q24H   insulin aspart  0-5 Units Subcutaneous QHS   insulin aspart  0-9 Units Subcutaneous TID WC   lidocaine  1 patch Transdermal Q24H   nicotine  14 mg Transdermal Daily   Continuous Infusions:  sodium chloride      Procedures/Studies: DG Chest 2 View  Result Date: 06/18/2023 CLINICAL DATA:  Rib pain. EXAM: CHEST - 2 VIEW COMPARISON:  07/19/2022. FINDINGS: Low lung volumes accentuate the pulmonary vasculature and cardiomediastinal silhouette. No consolidation or pulmonary edema. No pleural effusion or pneumothorax. No displaced rib fracture is seen. IMPRESSION: Low lung volumes without evidence of acute cardiopulmonary disease. No displaced rib fracture is seen. Electronically Signed   By: Orvan Falconer M.D.   On: 06/18/2023 13:01    Catarina Hartshorn,  DO  [name]  If 7PM-7AM, please contact night-coverage [email or web address] Password [email or web address] 06/19/2023, 7:11 AM   LOS: 0 days

## 2023-06-20 LAB — HEMOGLOBIN A1C
Hgb A1c MFr Bld: 5.9 % — ABNORMAL HIGH (ref 4.8–5.6)
Mean Plasma Glucose: 123 mg/dL

## 2023-08-03 ENCOUNTER — Encounter (HOSPITAL_COMMUNITY): Payer: Self-pay | Admitting: Emergency Medicine

## 2023-08-03 ENCOUNTER — Other Ambulatory Visit: Payer: Self-pay

## 2023-08-03 ENCOUNTER — Emergency Department (HOSPITAL_COMMUNITY)
Admission: EM | Admit: 2023-08-03 | Discharge: 2023-08-03 | Disposition: A | Discharge status: Home / Self Care | Payer: Medicare Other | Attending: Emergency Medicine | Admitting: Emergency Medicine

## 2023-08-03 DIAGNOSIS — H6692 Otitis media, unspecified, left ear: Secondary | ICD-10-CM

## 2023-08-03 DIAGNOSIS — Z7982 Long term (current) use of aspirin: Secondary | ICD-10-CM | POA: Diagnosis not present

## 2023-08-03 DIAGNOSIS — Z79899 Other long term (current) drug therapy: Secondary | ICD-10-CM | POA: Insufficient documentation

## 2023-08-03 DIAGNOSIS — H6122 Impacted cerumen, left ear: Secondary | ICD-10-CM

## 2023-08-03 DIAGNOSIS — I129 Hypertensive chronic kidney disease with stage 1 through stage 4 chronic kidney disease, or unspecified chronic kidney disease: Secondary | ICD-10-CM | POA: Insufficient documentation

## 2023-08-03 DIAGNOSIS — Z8673 Personal history of transient ischemic attack (TIA), and cerebral infarction without residual deficits: Secondary | ICD-10-CM | POA: Insufficient documentation

## 2023-08-03 DIAGNOSIS — N189 Chronic kidney disease, unspecified: Secondary | ICD-10-CM | POA: Diagnosis not present

## 2023-08-03 DIAGNOSIS — H9192 Unspecified hearing loss, left ear: Secondary | ICD-10-CM | POA: Diagnosis present

## 2023-08-03 MED ORDER — AMOXICILLIN-POT CLAVULANATE 875-125 MG PO TABS: 1.0000 | ORAL_TABLET | Freq: Two times a day (BID) | ORAL | 0 refills | Status: AC

## 2023-08-03 NOTE — Discharge Instructions (Addendum)
Pleasure taking care of you.  You have an ear infection and you had impacted earwax which we removed.  Take the antibiotics.  Follow-up close with your primary care doctor for recheck in a couple of days.  If you are not feeling better follow-up with the ENT doctor.  Do not put anything in your ear as this can be dangerous.  Have your primary care doctor recheck your blood pressure as well as it was somewhat high today.

## 2023-08-03 NOTE — ED Provider Notes (Signed)
[place] EMERGENCY DEPARTMENT AT [facility] Provider Note   CSN: [id number] Arrival date & time: 08/03/23  1241     History  Chief Complaint  Patient presents with   Ear Fullness    Taylor Obrien is a 57 y.o. male. Taylor Obrien has PMH of HTN, CVA, alcohol abuse, CKD.  To the ER today for left ear hearing loss, states Taylor Obrien thinks it is wax buildup.  Taylor Obrien was trying to use a Bobby pin, Q-tips and peroxide to get it out but has not been successful.  Started 4 days ago.  No fevers, no pain but does feel some fullness in his ear.   Ear Fullness       Home Medications Prior to Admission medications   Medication Sig Start Date End Date Taking? Authorizing Provider  amLODipine (NORVASC) 10 MG tablet Take 1 tablet (10 mg total) by mouth daily. 01/19/16   Kirsteins, Victorino Sparrow, MD  aspirin 325 MG tablet Take 1 tablet (325 mg total) by mouth daily. 12/20/15   Love, Evlyn Kanner, PA-C  atorvastatin (LIPITOR) 80 MG tablet Take 80 mg by mouth daily. 03/17/23   [provider]  cloNIDine (CATAPRES - DOSED IN MG/24 HR) 0.3 mg/24hr patch Place 1 patch (0.3 mg total) onto the skin once a week. Next change on monday Patient taking differently: Place 0.3 mg onto the skin every Wednesday. Next change on monday 12/20/15   Love, Evlyn Kanner, PA-C  diphenhydramine-acetaminophen (TYLENOL PM) 25-500 MG TABS tablet Take 1-2 tablets by mouth at bedtime as needed (for sleep). Depends on insomnia if takes 1-2 tablets    [provider]  empagliflozin (JARDIANCE) 25 MG TABS tablet Take 25 mg by mouth daily.     [provider]  escitalopram (LEXAPRO) 10 MG tablet Take 10 mg by mouth daily.    [provider]  fenofibrate (TRICOR) 145 MG tablet Take 145 mg by mouth daily. 04/10/22   [provider]  gabapentin (NEURONTIN) 100 MG capsule Take 100-300 mg by mouth every morning. 100mg  in am, 300mg  pm 01/24/23   [provider]  hydrOXYzine (ATARAX) 25 MG tablet Take 25 mg by  mouth every 8 (eight) hours as needed for anxiety. 04/17/22   [provider]  labetalol (NORMODYNE) 300 MG tablet Take 300 mg by mouth 2 (two) times daily. 03/08/22   [provider]  lisinopril (ZESTRIL) 10 MG tablet Take 10 mg by mouth daily. 06/18/22   [provider]  oxyCODONE (OXY IR/ROXICODONE) 5 MG immediate release tablet Take 5 mg by mouth every 8 (eight) hours. 06/28/22   [provider]  pantoprazole (PROTONIX) 40 MG tablet Take 1 tablet (40 mg total) by mouth at bedtime. 01/19/16   Kirsteins, Victorino Sparrow, MD  pregabalin (LYRICA) 50 MG capsule Take 50 mg by mouth 2 (two) times daily. 05/14/23   [provider]  tiZANidine (ZANAFLEX) 2 MG tablet Take 2 mg by mouth 2 (two) times daily. 03/19/23   [provider]  traZODone (DESYREL) 100 MG tablet Take 1 tablet (100 mg total) by mouth at bedtime. 01/19/16   Kirsteins, Victorino Sparrow, MD  triamcinolone cream (KENALOG) 0.1 % Apply 1 application. topically daily.    [provider]  VASCEPA 1 g capsule Take 2 g by mouth 2 (two) times daily. 03/12/22   [provider]      Allergies    Ibuprofen and Oxcarbazepine    Review of Systems   Review of Systems  Physical Exam Updated Vital Signs BP (!) 158/110 (BP Location: Left Arm)   Pulse 93   Temp 99.8 F (37.7 C) (Oral)   Resp 16   Ht 5\' 5"  (1.651 m)   Wt 77 kg   SpO2 97%   BMI 28.25 kg/m  Physical Exam Vitals and nursing note reviewed.  Constitutional:      General: Taylor Obrien is not in acute distress.    Appearance: Taylor Obrien is well-developed.  HENT:     Head: Normocephalic and atraumatic.     Right Ear: Tympanic membrane, ear canal and external ear normal.     Left Ear: External ear normal. Decreased hearing noted. No swelling or tenderness. There is impacted cerumen.     Ears:     Comments: Impacted cerumen noted left TM, after cerumen removal was able to visualize partially the anterior portion of the TM, there is appearance of  erythema with bulging and opaque effusion.  No visualized perforation or drainage Eyes:     Conjunctiva/sclera: Conjunctivae normal.  Cardiovascular:     Rate and Rhythm: Normal rate and regular rhythm.     Heart sounds: No murmur heard. Pulmonary:     Effort: Pulmonary effort is normal. No respiratory distress.     Breath sounds: Normal breath sounds.  Abdominal:     Palpations: Abdomen is soft.     Tenderness: There is no abdominal tenderness.  Musculoskeletal:        General: No swelling.     Cervical back: Neck supple.  Skin:    General: Skin is warm and dry.     Capillary Refill: Capillary refill takes less than 2 seconds.  Neurological:     Mental Status: Taylor Obrien is alert.  Psychiatric:        Mood and Affect: Mood normal.     ED Results / Procedures / Treatments   Labs (all labs ordered are listed, but only abnormal results are displayed) Labs Reviewed - No data to display  EKG None  Radiology No results found.  Procedures .Ear Cerumen Removal  Date/Time: 08/03/2023 2:01 PM  Performed by: Ma Rings, PA-C Authorized by: Ma Rings, PA-C   Consent:    Consent obtained:  Verbal   Consent given by:  Patient   Risks discussed:  Bleeding, infection, pain, incomplete removal, dizziness and TM perforation   Alternatives discussed:  Referral Universal protocol:    Procedure explained and questions answered to patient or proxy's satisfaction: yes     Patient identity confirmed:  Verbally with patient Procedure details:    Location:  L ear   Procedure type: curette     Procedure outcomes: cerumen removed   Post-procedure details:    Inspection:  Some cerumen remaining, TM intact and no bleeding   Hearing quality:  Improved   Procedure completion:  Tolerated well, no immediate complications     Medications Ordered in ED Medications - No data to display  ED Course/ Medical Decision Making/ A&P                                 Medical Decision  Making  DDx: Cerumen impaction, otitis media, otitis externa, other  Course: Patient having hearing loss left ear noted to have cerumen impaction.  Taylor Obrien has been using Q-tips and Bobby pin and peroxide to try to remove cerumen without relief.  Is able to remove some cerumen give him good relief.  I was able to visualize his TM partially and does appear to have otitis media, advised on PCP/ENT follow-up and return precautions.  Started on Augmentin for infection.        Final Clinical Impression(s) / ED Diagnoses Final diagnoses:  None    Rx / DC Orders ED Discharge Orders     None         Josem Kaufmann 08/03/23 1403    Gerhard Munch, MD 08/03/23 1551

## 2023-08-03 NOTE — ED Triage Notes (Addendum)
Pt states he cannot hear out of left ear x 4 days. Pt states he has been putting peroxide in ear with no relief. He also states he has been putting bobby pins in that ear.

## 2024-01-29 ENCOUNTER — Inpatient Hospital Stay (HOSPITAL_COMMUNITY)
Admission: EM | Admit: 2024-01-29 | Discharge: 2024-01-31 | DRG: 439 | Disposition: A | Discharge status: Home / Self Care | Payer: Medicare Other | Attending: Internal Medicine | Admitting: Internal Medicine

## 2024-01-29 ENCOUNTER — Emergency Department (HOSPITAL_COMMUNITY): Payer: Medicare Other

## 2024-01-29 ENCOUNTER — Other Ambulatory Visit: Payer: Self-pay

## 2024-01-29 DIAGNOSIS — R0602 Shortness of breath: Secondary | ICD-10-CM | POA: Diagnosis not present

## 2024-01-29 DIAGNOSIS — K219 Gastro-esophageal reflux disease without esophagitis: Secondary | ICD-10-CM | POA: Diagnosis present

## 2024-01-29 DIAGNOSIS — I69354 Hemiplegia and hemiparesis following cerebral infarction affecting left non-dominant side: Secondary | ICD-10-CM

## 2024-01-29 DIAGNOSIS — K861 Other chronic pancreatitis: Secondary | ICD-10-CM | POA: Diagnosis present

## 2024-01-29 DIAGNOSIS — Z7984 Long term (current) use of oral hypoglycemic drugs: Secondary | ICD-10-CM

## 2024-01-29 DIAGNOSIS — F419 Anxiety disorder, unspecified: Secondary | ICD-10-CM | POA: Insufficient documentation

## 2024-01-29 DIAGNOSIS — I160 Hypertensive urgency: Secondary | ICD-10-CM | POA: Insufficient documentation

## 2024-01-29 DIAGNOSIS — Z886 Allergy status to analgesic agent status: Secondary | ICD-10-CM

## 2024-01-29 DIAGNOSIS — Z1152 Encounter for screening for COVID-19: Secondary | ICD-10-CM

## 2024-01-29 DIAGNOSIS — Z79899 Other long term (current) drug therapy: Secondary | ICD-10-CM

## 2024-01-29 DIAGNOSIS — Z888 Allergy status to other drugs, medicaments and biological substances status: Secondary | ICD-10-CM

## 2024-01-29 DIAGNOSIS — Z83438 Family history of other disorder of lipoprotein metabolism and other lipidemia: Secondary | ICD-10-CM

## 2024-01-29 DIAGNOSIS — I10 Essential (primary) hypertension: Secondary | ICD-10-CM | POA: Diagnosis present

## 2024-01-29 DIAGNOSIS — E1122 Type 2 diabetes mellitus with diabetic chronic kidney disease: Secondary | ICD-10-CM | POA: Diagnosis present

## 2024-01-29 DIAGNOSIS — K8020 Calculus of gallbladder without cholecystitis without obstruction: Secondary | ICD-10-CM | POA: Diagnosis present

## 2024-01-29 DIAGNOSIS — Z7982 Long term (current) use of aspirin: Secondary | ICD-10-CM

## 2024-01-29 DIAGNOSIS — I129 Hypertensive chronic kidney disease with stage 1 through stage 4 chronic kidney disease, or unspecified chronic kidney disease: Secondary | ICD-10-CM | POA: Diagnosis present

## 2024-01-29 DIAGNOSIS — E782 Mixed hyperlipidemia: Secondary | ICD-10-CM | POA: Diagnosis present

## 2024-01-29 DIAGNOSIS — Z833 Family history of diabetes mellitus: Secondary | ICD-10-CM

## 2024-01-29 DIAGNOSIS — N1832 Chronic kidney disease, stage 3b: Secondary | ICD-10-CM | POA: Insufficient documentation

## 2024-01-29 DIAGNOSIS — Z720 Tobacco use: Secondary | ICD-10-CM | POA: Diagnosis present

## 2024-01-29 DIAGNOSIS — K8689 Other specified diseases of pancreas: Secondary | ICD-10-CM

## 2024-01-29 DIAGNOSIS — K859 Acute pancreatitis without necrosis or infection, unspecified: Principal | ICD-10-CM | POA: Diagnosis present

## 2024-01-29 DIAGNOSIS — E1165 Type 2 diabetes mellitus with hyperglycemia: Secondary | ICD-10-CM | POA: Insufficient documentation

## 2024-01-29 DIAGNOSIS — Z8249 Family history of ischemic heart disease and other diseases of the circulatory system: Secondary | ICD-10-CM

## 2024-01-29 DIAGNOSIS — Z716 Tobacco abuse counseling: Secondary | ICD-10-CM

## 2024-01-29 DIAGNOSIS — F32A Depression, unspecified: Secondary | ICD-10-CM | POA: Diagnosis present

## 2024-01-29 DIAGNOSIS — Z8673 Personal history of transient ischemic attack (TIA), and cerebral infarction without residual deficits: Secondary | ICD-10-CM

## 2024-01-29 DIAGNOSIS — F17210 Nicotine dependence, cigarettes, uncomplicated: Secondary | ICD-10-CM | POA: Diagnosis present

## 2024-01-29 LAB — RESP PANEL BY RT-PCR (RSV, FLU A&B, COVID)  RVPGX2
Influenza A by PCR: NEGATIVE
Influenza B by PCR: NEGATIVE
Resp Syncytial Virus by PCR: NEGATIVE
SARS Coronavirus 2 by RT PCR: NEGATIVE

## 2024-01-29 MED ORDER — ONDANSETRON HCL 4 MG/2ML IJ SOLN
4.0000 mg | Freq: Once | INTRAMUSCULAR | Status: AC
  Administered 2024-01-30: 4 mg via INTRAVENOUS
  Filled 2024-01-29: qty 2

## 2024-01-29 MED ORDER — MORPHINE SULFATE (PF) 4 MG/ML IV SOLN
4.0000 mg | Freq: Once | INTRAVENOUS | Status: AC
  Administered 2024-01-30: 4 mg via INTRAVENOUS
  Filled 2024-01-29: qty 1

## 2024-01-29 MED ORDER — SODIUM CHLORIDE 0.9 % IV BOLUS
1000.0000 mL | Freq: Once | INTRAVENOUS | Status: AC
  Administered 2024-01-30: 1000 mL via INTRAVENOUS

## 2024-01-29 NOTE — ED Provider Notes (Signed)
[place] EMERGENCY DEPARTMENT AT [facility] Provider Note   CSN: [id number] Arrival date & time: 01/29/24  2129     History  Chief Complaint  Patient presents with   Shortness of Breath   Cough    Taylor Obrien is a 58 y.o. male.  Patient is a 58 year old male with past medical history of prior stroke who is nonambulatory, hypertension, hyperlipidemia, pancreatitis.  Patient presenting today with complaints of back and abdominal pain.  This started earlier this evening while he was lying in bed.  He denies any injury or trauma.  He describes pain from the top of his back to the bottom as well as abdominal pain.  Family member at bedside concerned he may have recurrent pancreatitis.  No fevers or chills.  The history is provided by the patient.       Home Medications Prior to Admission medications   Medication Sig Start Date End Date Taking? Authorizing Provider  amLODipine (NORVASC) 10 MG tablet Take 1 tablet (10 mg total) by mouth daily. 01/19/16   Kirsteins, Victorino Sparrow, MD  aspirin 325 MG tablet Take 1 tablet (325 mg total) by mouth daily. 12/20/15   Love, Evlyn Kanner, PA-C  atorvastatin (LIPITOR) 80 MG tablet Take 80 mg by mouth daily. 03/17/23   [provider]  cloNIDine (CATAPRES - DOSED IN MG/24 HR) 0.3 mg/24hr patch Place 1 patch (0.3 mg total) onto the skin once a week. Next change on monday Patient taking differently: Place 0.3 mg onto the skin every Wednesday. Next change on monday 12/20/15   Love, Evlyn Kanner, PA-C  diphenhydramine-acetaminophen (TYLENOL PM) 25-500 MG TABS tablet Take 1-2 tablets by mouth at bedtime as needed (for sleep). Depends on insomnia if takes 1-2 tablets    [provider]  empagliflozin (JARDIANCE) 25 MG TABS tablet Take 25 mg by mouth daily.     [provider]  escitalopram (LEXAPRO) 10 MG tablet Take 10 mg by mouth daily.    [provider]  fenofibrate (TRICOR) 145 MG tablet Take 145 mg by mouth daily.  04/10/22   [provider]  gabapentin (NEURONTIN) 100 MG capsule Take 100-300 mg by mouth every morning. 100mg  in am, 300mg  pm 01/24/23   [provider]  hydrOXYzine (ATARAX) 25 MG tablet Take 25 mg by mouth every 8 (eight) hours as needed for anxiety. 04/17/22   [provider]  labetalol (NORMODYNE) 300 MG tablet Take 300 mg by mouth 2 (two) times daily. 03/08/22   [provider]  lisinopril (ZESTRIL) 10 MG tablet Take 10 mg by mouth daily. 06/18/22   [provider]  oxyCODONE (OXY IR/ROXICODONE) 5 MG immediate release tablet Take 5 mg by mouth every 8 (eight) hours. 06/28/22   [provider]  pantoprazole (PROTONIX) 40 MG tablet Take 1 tablet (40 mg total) by mouth at bedtime. 01/19/16   Kirsteins, Victorino Sparrow, MD  pregabalin (LYRICA) 50 MG capsule Take 50 mg by mouth 2 (two) times daily. 05/14/23   [provider]  tiZANidine (ZANAFLEX) 2 MG tablet Take 2 mg by mouth 2 (two) times daily. 03/19/23   [provider]  traZODone (DESYREL) 100 MG tablet Take 1 tablet (100 mg total) by mouth at bedtime. 01/19/16   Kirsteins, Victorino Sparrow, MD  triamcinolone cream (KENALOG) 0.1 % Apply 1 application. topically daily.    [provider]  VASCEPA 1 g capsule Take 2 g by mouth 2 (two) times daily. 03/12/22   [provider]      Allergies    Ibuprofen and Oxcarbazepine    Review of Systems   Review of Systems  All other systems reviewed and are negative.   Physical Exam Updated Vital Signs BP (!) 212/114 (BP Location: Left Arm)   Pulse 72   Temp 98.8 F (37.1 C) (Oral)   Resp 16   Ht 5\' 5"  (1.651 m)   Wt 77 kg   SpO2 100%   BMI 28.25 kg/m  Physical Exam Vitals and nursing note reviewed.  Constitutional:      General: He is not in acute distress.    Appearance: He is well-developed. He is not diaphoretic.  HENT:     Head: Normocephalic and atraumatic.  Cardiovascular:     Rate and Rhythm: Normal rate and  regular rhythm.     Heart sounds: No murmur heard.    No friction rub.  Pulmonary:     Effort: Pulmonary effort is normal. No respiratory distress.     Breath sounds: Normal breath sounds. No wheezing or rales.  Abdominal:     General: Bowel sounds are normal. There is no distension.     Palpations: Abdomen is soft.     Tenderness: There is abdominal tenderness. There is no guarding or rebound.  Musculoskeletal:        General: Normal range of motion.     Cervical back: Normal range of motion and neck supple.  Skin:    General: Skin is warm and dry.  Neurological:     Mental Status: He is alert and oriented to person, place, and time.     Coordination: Coordination normal.     ED Results / Procedures / Treatments   Labs (all labs ordered are listed, but only abnormal results are displayed) Labs Reviewed  RESP PANEL BY RT-PCR (RSV, FLU A&B, COVID)  [id number]    EKG EKG Interpretation Date/Time:  Thursday January 29 2024 21:42:37 EST Ventricular Rate:  75 PR Interval:  188 QRS Duration:  106 QT Interval:  404 QTC Calculation: 451 R Axis:   -28  Text Interpretation: Normal sinus rhythm Biatrial enlargement Left ventricular hypertrophy with repolarization abnormality ( R in aVL , Cornell product ) Inferior infarct (cited on or before 19-Jun-2023) Abnormal ECG When compared with ECG of 19-Jun-2023 07:59, No significant change was found Confirmed by Bethann Berkshire [phone]) on 01/29/2024 9:49:09 PM  Radiology DG Chest 2 View Result Date: 01/29/2024 CLINICAL DATA:  Shortness of breath EXAM: CHEST - 2 VIEW COMPARISON:  06/18/2023 FINDINGS: Lungs are clear.  No pleural effusion or pneumothorax. The heart is normal in size. Mild degenerative changes of the visualized thoracolumbar spine. IMPRESSION: Normal chest radiographs. Electronically Signed   By: Charline Bills M.D.   On: 01/29/2024 22:03    Procedures Procedures  {Document cardiac monitor, telemetry assessment procedure when  appropriate:1}  Medications Ordered in ED Medications  sodium chloride 0.9 % bolus 1,000 mL (has no administration in time range)  ondansetron (ZOFRAN) injection 4 mg (has no administration in time range)  morphine (PF) 4 MG/ML injection 4 mg (has no administration in time range)    ED Course/ Medical Decision Making/ A&P   {   Click here for ABCD2, HEART and other calculatorsREFRESH Note before signing :1}                              Medical Decision Making Amount and/or Complexity of  Data Reviewed Labs: ordered. Radiology: ordered.  Risk Prescription drug management.   ***  {Document critical care time when appropriate:1} {Document review of labs and clinical decision tools ie heart score, Chads2Vasc2 etc:1}  {Document your independent review of radiology images, and any outside records:1} {Document your discussion with family members, caretakers, and with consultants:1} {Document social determinants of health affecting pt's care:1} {Document your decision making why or why not admission, treatments were needed:1} Final Clinical Impression(s) / ED Diagnoses Final diagnoses:  None    Rx / DC Orders ED Discharge Orders     None

## 2024-01-29 NOTE — ED Triage Notes (Signed)
Has not had night time BP meds but states BP stays high

## 2024-01-29 NOTE — ED Notes (Signed)
SARS being re ran by lab

## 2024-01-29 NOTE — ED Triage Notes (Signed)
Pt complains of SOB and productive cough that started just a few hours ago. Also has chronic back and shoulder pain.

## 2024-01-30 ENCOUNTER — Inpatient Hospital Stay (HOSPITAL_COMMUNITY): Payer: Medicare Other

## 2024-01-30 ENCOUNTER — Encounter (HOSPITAL_COMMUNITY): Payer: Self-pay | Admitting: Internal Medicine

## 2024-01-30 DIAGNOSIS — K828 Other specified diseases of gallbladder: Secondary | ICD-10-CM | POA: Diagnosis not present

## 2024-01-30 DIAGNOSIS — E782 Mixed hyperlipidemia: Secondary | ICD-10-CM | POA: Diagnosis present

## 2024-01-30 DIAGNOSIS — N1832 Chronic kidney disease, stage 3b: Secondary | ICD-10-CM | POA: Diagnosis present

## 2024-01-30 DIAGNOSIS — E1165 Type 2 diabetes mellitus with hyperglycemia: Secondary | ICD-10-CM | POA: Diagnosis present

## 2024-01-30 DIAGNOSIS — K219 Gastro-esophageal reflux disease without esophagitis: Secondary | ICD-10-CM | POA: Diagnosis present

## 2024-01-30 DIAGNOSIS — F419 Anxiety disorder, unspecified: Secondary | ICD-10-CM | POA: Diagnosis present

## 2024-01-30 DIAGNOSIS — I10 Essential (primary) hypertension: Secondary | ICD-10-CM

## 2024-01-30 DIAGNOSIS — Z83438 Family history of other disorder of lipoprotein metabolism and other lipidemia: Secondary | ICD-10-CM | POA: Diagnosis not present

## 2024-01-30 DIAGNOSIS — I160 Hypertensive urgency: Secondary | ICD-10-CM | POA: Insufficient documentation

## 2024-01-30 DIAGNOSIS — Z888 Allergy status to other drugs, medicaments and biological substances status: Secondary | ICD-10-CM | POA: Diagnosis not present

## 2024-01-30 DIAGNOSIS — Z720 Tobacco use: Secondary | ICD-10-CM

## 2024-01-30 DIAGNOSIS — Z8249 Family history of ischemic heart disease and other diseases of the circulatory system: Secondary | ICD-10-CM | POA: Diagnosis not present

## 2024-01-30 DIAGNOSIS — F17210 Nicotine dependence, cigarettes, uncomplicated: Secondary | ICD-10-CM | POA: Diagnosis present

## 2024-01-30 DIAGNOSIS — K8689 Other specified diseases of pancreas: Secondary | ICD-10-CM

## 2024-01-30 DIAGNOSIS — Z7984 Long term (current) use of oral hypoglycemic drugs: Secondary | ICD-10-CM | POA: Diagnosis not present

## 2024-01-30 DIAGNOSIS — Z1152 Encounter for screening for COVID-19: Secondary | ICD-10-CM | POA: Diagnosis not present

## 2024-01-30 DIAGNOSIS — K8020 Calculus of gallbladder without cholecystitis without obstruction: Secondary | ICD-10-CM | POA: Diagnosis present

## 2024-01-30 DIAGNOSIS — Z7982 Long term (current) use of aspirin: Secondary | ICD-10-CM | POA: Diagnosis not present

## 2024-01-30 DIAGNOSIS — E1122 Type 2 diabetes mellitus with diabetic chronic kidney disease: Secondary | ICD-10-CM | POA: Diagnosis present

## 2024-01-30 DIAGNOSIS — Z886 Allergy status to analgesic agent status: Secondary | ICD-10-CM | POA: Diagnosis not present

## 2024-01-30 DIAGNOSIS — K859 Acute pancreatitis without necrosis or infection, unspecified: Secondary | ICD-10-CM | POA: Diagnosis present

## 2024-01-30 DIAGNOSIS — Z79899 Other long term (current) drug therapy: Secondary | ICD-10-CM | POA: Diagnosis not present

## 2024-01-30 DIAGNOSIS — F1011 Alcohol abuse, in remission: Secondary | ICD-10-CM | POA: Diagnosis not present

## 2024-01-30 DIAGNOSIS — F32A Depression, unspecified: Secondary | ICD-10-CM | POA: Diagnosis present

## 2024-01-30 DIAGNOSIS — K861 Other chronic pancreatitis: Secondary | ICD-10-CM | POA: Diagnosis present

## 2024-01-30 DIAGNOSIS — R0602 Shortness of breath: Secondary | ICD-10-CM | POA: Diagnosis present

## 2024-01-30 DIAGNOSIS — F418 Other specified anxiety disorders: Secondary | ICD-10-CM

## 2024-01-30 DIAGNOSIS — I129 Hypertensive chronic kidney disease with stage 1 through stage 4 chronic kidney disease, or unspecified chronic kidney disease: Secondary | ICD-10-CM | POA: Diagnosis present

## 2024-01-30 DIAGNOSIS — Z716 Tobacco abuse counseling: Secondary | ICD-10-CM | POA: Diagnosis not present

## 2024-01-30 DIAGNOSIS — Z833 Family history of diabetes mellitus: Secondary | ICD-10-CM | POA: Diagnosis not present

## 2024-01-30 DIAGNOSIS — Z8673 Personal history of transient ischemic attack (TIA), and cerebral infarction without residual deficits: Secondary | ICD-10-CM

## 2024-01-30 DIAGNOSIS — I69354 Hemiplegia and hemiparesis following cerebral infarction affecting left non-dominant side: Secondary | ICD-10-CM | POA: Diagnosis not present

## 2024-01-30 LAB — ETHANOL: Alcohol, Ethyl (B): <10 mg/dL (ref <10)

## 2024-01-30 LAB — LIPID PANEL
Cholesterol: 278 mg/dL — ABNORMAL HIGH (ref 0–200)
HDL: 46 mg/dL (ref >40)
LDL Cholesterol: 190 mg/dL — ABNORMAL HIGH (ref 0–99)
Total CHOL/HDL Ratio: 6 {ratio}
Triglycerides: 210 mg/dL — ABNORMAL HIGH (ref <150)
VLDL: 42 mg/dL — ABNORMAL HIGH (ref 0–40)

## 2024-01-30 LAB — CBC WITH DIFFERENTIAL/PLATELET
Abs Immature Granulocytes: 0.04 10*3/uL (ref 0.00–0.07)
Basophils Absolute: 0 10*3/uL (ref 0.0–0.1)
Basophils Relative: 0 %
Eosinophils Absolute: 0.1 10*3/uL (ref 0.0–0.5)
Eosinophils Relative: 1 %
HCT: 42.5 % (ref 39.0–52.0)
Hemoglobin: 13.9 g/dL (ref 13.0–17.0)
Immature Granulocytes: 0 %
Lymphocytes Relative: 10 %
Lymphs Abs: 1.1 10*3/uL (ref 0.7–4.0)
MCH: 31.1 pg (ref 26.0–34.0)
MCHC: 32.7 g/dL (ref 30.0–36.0)
MCV: 95.1 fL (ref 80.0–100.0)
Monocytes Absolute: 0.6 10*3/uL (ref 0.1–1.0)
Monocytes Relative: 5 %
Neutro Abs: 9.6 10*3/uL — ABNORMAL HIGH (ref 1.7–7.7)
Neutrophils Relative %: 84 %
Platelets: 231 10*3/uL (ref 150–400)
RBC: 4.47 MIL/uL (ref 4.22–5.81)
RDW: 14.5 % (ref 11.5–15.5)
WBC: 11.5 10*3/uL — ABNORMAL HIGH (ref 4.0–10.5)
nRBC: 0 % (ref 0.0–0.2)

## 2024-01-30 LAB — COMPREHENSIVE METABOLIC PANEL
ALT: 12 U/L (ref 0–44)
AST: 20 U/L (ref 15–41)
Albumin: 3.8 g/dL (ref 3.5–5.0)
Alkaline Phosphatase: 39 U/L (ref 38–126)
Anion gap: 10 (ref 5–15)
BUN: 21 mg/dL — ABNORMAL HIGH (ref 6–20)
CO2: 22 mmol/L (ref 22–32)
Calcium: 9 mg/dL (ref 8.9–10.3)
Chloride: 107 mmol/L (ref 98–111)
Creatinine, Ser: 2 mg/dL — ABNORMAL HIGH (ref 0.61–1.24)
GFR, Estimated: 39 mL/min — ABNORMAL LOW (ref >60)
Glucose, Bld: 154 mg/dL — ABNORMAL HIGH (ref 70–99)
Potassium: 4.2 mmol/L (ref 3.5–5.1)
Sodium: 139 mmol/L (ref 135–145)
Total Bilirubin: 0.7 mg/dL (ref 0.0–1.2)
Total Protein: 7 g/dL (ref 6.5–8.1)

## 2024-01-30 LAB — GLUCOSE, CAPILLARY
Glucose-Capillary: 74 mg/dL (ref 70–99)
Glucose-Capillary: 142 mg/dL — ABNORMAL HIGH (ref 70–99)

## 2024-01-30 LAB — LIPASE, BLOOD: Lipase: 3477 U/L — ABNORMAL HIGH (ref 11–51)

## 2024-01-30 LAB — HEMOGLOBIN A1C
Hgb A1c MFr Bld: 6.7 % — ABNORMAL HIGH (ref 4.8–5.6)
Mean Plasma Glucose: 145.59 mg/dL

## 2024-01-30 LAB — MAGNESIUM: Magnesium: 1.8 mg/dL (ref 1.7–2.4)

## 2024-01-30 LAB — CBG MONITORING, ED
Glucose-Capillary: 100 mg/dL — ABNORMAL HIGH (ref 70–99)
Glucose-Capillary: 137 mg/dL — ABNORMAL HIGH (ref 70–99)

## 2024-01-30 LAB — PHOSPHORUS: Phosphorus: 3 mg/dL (ref 2.5–4.6)

## 2024-01-30 MED ORDER — IOHEXOL 300 MG/ML  SOLN
75.0000 mL | Freq: Once | INTRAMUSCULAR | Status: AC | PRN
  Administered 2024-01-30: 75 mL via INTRAVENOUS

## 2024-01-30 MED ORDER — ENOXAPARIN SODIUM 40 MG/0.4ML IJ SOSY
40.0000 mg | PREFILLED_SYRINGE | INTRAMUSCULAR | Status: DC
  Administered 2024-01-30 – 2024-01-31 (×2): 40 mg via SUBCUTANEOUS
  Filled 2024-01-30 (×2): qty 0.4

## 2024-01-30 MED ORDER — ACETAMINOPHEN 650 MG RE SUPP: 650.0000 mg | Freq: Four times a day (QID) | RECTAL | Status: DC | PRN

## 2024-01-30 MED ORDER — MORPHINE SULFATE (PF) 4 MG/ML IV SOLN
4.0000 mg | Freq: Once | INTRAVENOUS | Status: AC
  Administered 2024-01-30: 4 mg via INTRAVENOUS
  Filled 2024-01-30: qty 1

## 2024-01-30 MED ORDER — ESCITALOPRAM OXALATE 10 MG PO TABS: 10.0000 mg | ORAL_TABLET | Freq: Every day | ORAL | Status: DC

## 2024-01-30 MED ORDER — ONDANSETRON HCL 4 MG/2ML IJ SOLN: 4.0000 mg | Freq: Four times a day (QID) | INTRAMUSCULAR | Status: DC | PRN

## 2024-01-30 MED ORDER — INSULIN ASPART 100 UNIT/ML IJ SOLN
0.0000 [IU] | Freq: Three times a day (TID) | INTRAMUSCULAR | Status: DC
  Administered 2024-01-30: 1 [IU] via SUBCUTANEOUS
  Administered 2024-01-31: 3 [IU] via SUBCUTANEOUS

## 2024-01-30 MED ORDER — PANTOPRAZOLE SODIUM 40 MG IV SOLR
40.0000 mg | INTRAVENOUS | Status: DC
  Administered 2024-01-30 – 2024-01-31 (×2): 40 mg via INTRAVENOUS
  Filled 2024-01-30 (×2): qty 10

## 2024-01-30 MED ORDER — AMLODIPINE BESYLATE 5 MG PO TABS
10.0000 mg | ORAL_TABLET | Freq: Every day | ORAL | Status: DC
  Administered 2024-01-30 – 2024-01-31 (×2): 10 mg via ORAL
  Filled 2024-01-30 (×2): qty 2

## 2024-01-30 MED ORDER — LISINOPRIL 10 MG PO TABS
10.0000 mg | ORAL_TABLET | Freq: Every day | ORAL | Status: DC
  Administered 2024-01-30: 10 mg via ORAL
  Filled 2024-01-30: qty 1

## 2024-01-30 MED ORDER — MORPHINE SULFATE (PF) 2 MG/ML IV SOLN
2.0000 mg | INTRAVENOUS | Status: DC | PRN
  Administered 2024-01-30 (×4): 2 mg via INTRAVENOUS
  Filled 2024-01-30 (×4): qty 1

## 2024-01-30 MED ORDER — ATORVASTATIN CALCIUM 40 MG PO TABS
80.0000 mg | ORAL_TABLET | Freq: Every day | ORAL | Status: DC
  Administered 2024-01-30 – 2024-01-31 (×2): 80 mg via ORAL
  Filled 2024-01-30 (×2): qty 2

## 2024-01-30 MED ORDER — CLONIDINE HCL 0.2 MG/24HR TD PTWK: 0.3000 mg | MEDICATED_PATCH | TRANSDERMAL | Status: DC

## 2024-01-30 MED ORDER — ACETAMINOPHEN 325 MG PO TABS: 650.0000 mg | ORAL_TABLET | Freq: Four times a day (QID) | ORAL | Status: DC | PRN

## 2024-01-30 MED ORDER — ONDANSETRON HCL 4 MG PO TABS: 4.0000 mg | ORAL_TABLET | Freq: Four times a day (QID) | ORAL | Status: DC | PRN

## 2024-01-30 MED ORDER — LACTATED RINGERS IV SOLN: INTRAVENOUS | Status: AC

## 2024-01-30 MED ORDER — HYDROXYZINE HCL 25 MG PO TABS: 25.0000 mg | ORAL_TABLET | Freq: Three times a day (TID) | ORAL | Status: DC | PRN

## 2024-01-30 MED ORDER — FENOFIBRATE 160 MG PO TABS
160.0000 mg | ORAL_TABLET | Freq: Every day | ORAL | Status: DC
  Administered 2024-01-30 – 2024-01-31 (×2): 160 mg via ORAL
  Filled 2024-01-30 (×2): qty 1

## 2024-01-30 MED ORDER — ASPIRIN 325 MG PO TABS
325.0000 mg | ORAL_TABLET | Freq: Every day | ORAL | Status: DC
  Administered 2024-01-31: 325 mg via ORAL
  Filled 2024-01-30: qty 1

## 2024-01-30 NOTE — ED Notes (Signed)
 [place] at bedside

## 2024-01-30 NOTE — Consult Note (Signed)
Gastroenterology Consult   Referring Provider: No ref. provider found Primary Care Physician:  Renaldo Harrison, NP Primary Gastroenterologist:  Hennie Duos. Marletta Lor, DO   Patient ID: Taylor Obrien; [id number]; 11-15-1966   Admit date: 01/29/2024  LOS: 0 days   Date of Consultation: 01/30/2024  Reason for Consultation:  recurrent pancreatitis    History of Present Illness   Taylor Obrien is a 58 y.o. male with past medical history significant for hyperlipidemia, hypertension, type 2 diabetes mellitus, GERD, stroke with left-sided hemiparesis, pancreatitis who presented to the emergency department due to upper abdominal pain with radiation into the back.  Symptoms associated with nausea/vomiting.  Patient thought he might have pancreatitis. Last episode about two years ago when he was still drinking etoh. Sober for two years.     In the ED: Blood pressure 212/114, glucose 154, creatinine 2, LFTs normal, white blood cell count 11,500, hemoglobin 13.9, platelets 239,000, lipase 3477.  Triglycerides 210.  Alcohol less than 10.    CT abdomen pelvis with contrast suggestive of acute pancreatitis without complication.  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  No obvious mass.  Recommended follow-up MRI abdomen with and without contrast in 3 to 4 weeks.  Noted to have contracted gallbladder with cholelithiasis.  GI consult: Patient presents with several day history of upper abdominal pain associated with nausea/vomiting.  No hematemesis.  Similar to prior episodes of pancreatitis.  He reports his last episode was about 2 years ago.  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  Denies fever.  Heartburn controlled on pantoprazole.  Denies constipation, diarrhea, melena, rectal bleeding.  He takes ibuprofen 400 mg every other day or so.  He states he has been advised to cut back.   Prior to Admission medications   Medication Sig Start Date  End Date Taking? Authorizing Provider  escitalopram (LEXAPRO) 20 MG tablet Take 20 mg by mouth daily. 01/14/24  Yes [provider]  lisinopril (ZESTRIL) 20 MG tablet Take 20 mg by mouth daily. 09/08/23  Yes [provider]  metFORMIN (GLUCOPHAGE) 500 MG tablet Take 500 mg by mouth 2 (two) times daily. 09/08/23  Yes [provider]  traZODone (DESYREL) 50 MG tablet Take 50 mg by mouth at bedtime. 01/06/24  Yes [provider]  amLODipine (NORVASC) 10 MG tablet Take 1 tablet (10 mg total) by mouth daily. 01/19/16   Kirsteins, Victorino Sparrow, MD  aspirin 325 MG tablet Take 1 tablet (325 mg total) by mouth daily. 12/20/15   Love, Evlyn Kanner, PA-C  atorvastatin (LIPITOR) 80 MG tablet Take 80 mg by mouth daily. 03/17/23   [provider]  cloNIDine (CATAPRES - DOSED IN MG/24 HR) 0.3 mg/24hr patch Place 1 patch (0.3 mg total) onto the skin once a week. Next change on monday Patient taking differently: Place 0.3 mg onto the skin every Wednesday. Next change on monday 12/20/15   Love, Evlyn Kanner, PA-C  cloNIDine (CATAPRES - DOSED IN MG/24 HR) 0.3 mg/24hr patch Place 0.3 mg onto the skin once a week. 11/19/23   [provider]  diphenhydramine-acetaminophen (TYLENOL PM) 25-500 MG TABS tablet Take 1-2 tablets by mouth at bedtime as needed (for sleep). Depends on insomnia if takes 1-2 tablets    [provider]  empagliflozin (JARDIANCE) 25 MG TABS tablet Take 25 mg by mouth daily.     [provider]  escitalopram (LEXAPRO) 10 MG tablet Take 10 mg by  mouth daily.    [provider]  fenofibrate (TRICOR) 145 MG tablet Take 145 mg by mouth daily. 04/10/22   [provider]  gabapentin (NEURONTIN) 100 MG capsule Take 100-300 mg by mouth every morning. 100mg  in am, 300mg  pm 01/24/23   [provider]  gabapentin (NEURONTIN) 300 MG capsule Take 300 mg by mouth every evening. 11/19/23   [provider]  hydrOXYzine (ATARAX) 25 MG  tablet Take 25 mg by mouth every 8 (eight) hours as needed for anxiety. 04/17/22   [provider]  ibuprofen (ADVIL) 600 MG tablet Take 600 mg by mouth every 6 (six) hours as needed for moderate pain (pain score 4-6).    [provider]  labetalol (NORMODYNE) 300 MG tablet Take 300 mg by mouth 2 (two) times daily. 03/08/22   [provider]  lisinopril (ZESTRIL) 10 MG tablet Take 10 mg by mouth daily. 06/18/22   [provider]  oxyCODONE (OXY IR/ROXICODONE) 5 MG immediate release tablet Take 5 mg by mouth every 8 (eight) hours. 06/28/22   [provider]  pantoprazole (PROTONIX) 40 MG tablet Take 1 tablet (40 mg total) by mouth at bedtime. 01/19/16   Kirsteins, Victorino Sparrow, MD  pregabalin (LYRICA) 50 MG capsule Take 50 mg by mouth 2 (two) times daily. 05/14/23   [provider]  tiZANidine (ZANAFLEX) 2 MG tablet Take 2 mg by mouth 2 (two) times daily. 03/19/23   [provider]  traZODone (DESYREL) 100 MG tablet Take 1 tablet (100 mg total) by mouth at bedtime. 01/19/16   Kirsteins, Victorino Sparrow, MD  triamcinolone cream (KENALOG) 0.1 % Apply 1 application. topically daily.    [provider]  VASCEPA 1 g capsule Take 2 g by mouth 2 (two) times daily. 03/12/22   [provider]    Current Facility-Administered Medications  Medication Dose Route Frequency Provider Last Rate Last Admin   acetaminophen (TYLENOL) tablet 650 mg  650 mg Oral Q6H PRN Adefeso, Oladapo, DO       Or   acetaminophen (TYLENOL) suppository 650 mg  650 mg Rectal Q6H PRN Adefeso, Oladapo, DO       enoxaparin (LOVENOX) injection 40 mg  40 mg Subcutaneous Q24H Adefeso, Oladapo, DO       insulin aspart (novoLOG) injection 0-9 Units  0-9 Units Subcutaneous TID WC Adefeso, Oladapo, DO       lactated ringers infusion   Intravenous Continuous Adefeso, Oladapo, DO 100 mL/hr at 01/30/24 0657 New Bag at 01/30/24 0657   morphine (PF) 2 MG/ML injection 2 mg  2 mg Intravenous  Q4H PRN Adefeso, Oladapo, DO   2 mg at 01/30/24 0657   ondansetron (ZOFRAN) tablet 4 mg  4 mg Oral Q6H PRN Adefeso, Oladapo, DO       Or   ondansetron (ZOFRAN) injection 4 mg  4 mg Intravenous Q6H PRN Adefeso, Oladapo, DO       pantoprazole (PROTONIX) injection 40 mg  40 mg Intravenous Q24H Adefeso, Oladapo, DO       Current Outpatient Medications  Medication Sig Dispense Refill   escitalopram (LEXAPRO) 20 MG tablet Take 20 mg by mouth daily.     lisinopril (ZESTRIL) 20 MG tablet Take 20 mg by mouth daily.     metFORMIN (GLUCOPHAGE) 500 MG tablet Take 500 mg by mouth 2 (two) times daily.     traZODone (DESYREL) 50 MG tablet Take 50 mg by mouth at bedtime.     amLODipine (NORVASC) 10  MG tablet Take 1 tablet (10 mg total) by mouth daily. 30 tablet 0   aspirin 325 MG tablet Take 1 tablet (325 mg total) by mouth daily. 100 tablet 0   atorvastatin (LIPITOR) 80 MG tablet Take 80 mg by mouth daily.     cloNIDine (CATAPRES - DOSED IN MG/24 HR) 0.3 mg/24hr patch Place 1 patch (0.3 mg total) onto the skin once a week. Next change on monday (Patient taking differently: Place 0.3 mg onto the skin every Wednesday. Next change on monday) 4 patch 12   cloNIDine (CATAPRES - DOSED IN MG/24 HR) 0.3 mg/24hr patch Place 0.3 mg onto the skin once a week.     diphenhydramine-acetaminophen (TYLENOL PM) 25-500 MG TABS tablet Take 1-2 tablets by mouth at bedtime as needed (for sleep). Depends on insomnia if takes 1-2 tablets     empagliflozin (JARDIANCE) 25 MG TABS tablet Take 25 mg by mouth daily.      escitalopram (LEXAPRO) 10 MG tablet Take 10 mg by mouth daily.     fenofibrate (TRICOR) 145 MG tablet Take 145 mg by mouth daily.     gabapentin (NEURONTIN) 100 MG capsule Take 100-300 mg by mouth every morning. 100mg  in am, 300mg  pm     gabapentin (NEURONTIN) 300 MG capsule Take 300 mg by mouth every evening.     hydrOXYzine (ATARAX) 25 MG tablet Take 25 mg by mouth every 8 (eight) hours as needed for anxiety.      ibuprofen (ADVIL) 600 MG tablet Take 600 mg by mouth every 6 (six) hours as needed for moderate pain (pain score 4-6).     labetalol (NORMODYNE) 300 MG tablet Take 300 mg by mouth 2 (two) times daily.     lisinopril (ZESTRIL) 10 MG tablet Take 10 mg by mouth daily.     oxyCODONE (OXY IR/ROXICODONE) 5 MG immediate release tablet Take 5 mg by mouth every 8 (eight) hours.     pantoprazole (PROTONIX) 40 MG tablet Take 1 tablet (40 mg total) by mouth at bedtime. 30 tablet 0   pregabalin (LYRICA) 50 MG capsule Take 50 mg by mouth 2 (two) times daily.     tiZANidine (ZANAFLEX) 2 MG tablet Take 2 mg by mouth 2 (two) times daily.     traZODone (DESYREL) 100 MG tablet Take 1 tablet (100 mg total) by mouth at bedtime. 30 tablet 1   triamcinolone cream (KENALOG) 0.1 % Apply 1 application. topically daily.     VASCEPA 1 g capsule Take 2 g by mouth 2 (two) times daily.      Allergies as of 01/29/2024 - Review Complete 01/29/2024  Allergen Reaction Noted   Ibuprofen Other (See Comments) 06/18/2023   Oxcarbazepine Other (See Comments) 08/02/2011    Past Medical History:  Diagnosis Date   Acute alcoholic pancreatitis    Arthritis    Chronic pain    Depression    Diabetes mellitus without complication (HCC)    Essential hypertension, benign    GERD (gastroesophageal reflux disease)    Headache    HTN (hypertension) 11/27/2015   Hyperlipidemia    Sleep apnea    could not tolerate CPAP   Stroke (HCC)    2016    Past Surgical History:  Procedure Laterality Date   BIOPSY  08/19/2022   Procedure: BIOPSY;  Surgeon: Lanelle Bal, DO;  Location: [facility];  Service: Endoscopy;;   CLOSED REDUCTION MANDIBULAR FRACTURE W/ ARCH BARS     + multiple extractions  COLONOSCOPY WITH PROPOFOL N/A 08/19/2022   Procedure: COLONOSCOPY WITH PROPOFOL;  Surgeon: Lanelle Bal, DO;  Location: [facility];  Service: Endoscopy;  Laterality: N/A;  9:15am   Condyloma resection      ESOPHAGOGASTRODUODENOSCOPY (EGD) WITH PROPOFOL N/A 08/19/2022   Procedure: ESOPHAGOGASTRODUODENOSCOPY (EGD) WITH PROPOFOL;  Surgeon: Lanelle Bal, DO;  Location: [facility];  Service: Endoscopy;  Laterality: N/A;   MULTIPLE EXTRACTIONS WITH ALVEOLOPLASTY Bilateral 01/23/2018   Procedure: DENTAL EXTRACTION OF TEETH NUMBER ONE, TWO, THREE, FOUR, FIVE, SIX, SEVEN, EIGHT, NINE, TEN, ELEVEN, TWELVE, THIRTEEN, FOURTEEN, FIFTEEN, SIXTEEN, SEVENTEEN, TWENTY, TWENTY-ONE, TWENTY-TWO, TWENTY-THREE, TWENTY-FOUR, TWENTY-FIVE, TWENTY-SIX, TWENTY-SEVEN, TWENTY-EIGHT, TWENTY-NINE, THIRTY-TWO WITH ALVEOLOPLASTY;  Surgeon: Ocie Doyne, DDS;  Location: [facility];  Service: Oral Surgery;  Lat   POLYPECTOMY  08/19/2022   Procedure: POLYPECTOMY;  Surgeon: Lanelle Bal, DO;  Location: [facility];  Service: Endoscopy;;   RADIOLOGY WITH ANESTHESIA N/A 11/18/2015   Procedure: RADIOLOGY WITH ANESTHESIA;  Surgeon: Julieanne Cotton, MD;  Location: [facility] OR;  Service: Radiology;  Laterality: N/A;   Removal foreign body right shoulder     Right rotator cuff repair     TEE WITHOUT CARDIOVERSION N/A 11/21/2015   Procedure: TRANSESOPHAGEAL ECHOCARDIOGRAM (TEE);  Surgeon: Laurey Morale, MD;  Location: [facility]COPY;  Service: Cardiovascular;  Laterality: N/A;   TOOTH EXTRACTION N/A 01/24/2018   Procedure: SUTURE ORAL WOUND;  Surgeon: Ocie Doyne, DDS;  Location: [facility] OR;  Service: Oral Surgery;  Laterality: N/A;    Family History  Problem Relation Age of Onset   Diabetes Mother    Hypertension Mother    Drug abuse Mother    Hyperlipidemia Mother    Diabetes Father    Hypertension Father    Hyperlipidemia Father    Diabetes Brother    Hypertension Brother     Social History   Socioeconomic History   Marital status: Married    Spouse name: Not on file   Number of children: Not on file   Years of education: Not on file   Highest education level: Not on file  Occupational History   Not on file  Tobacco Use    Smoking status: Every Day    Current packs/day: 0.50    Average packs/day: 0.5 packs/day for 30.0 years (15.0 ttl pk-yrs)    Types: Cigarettes   Smokeless tobacco: Never  Vaping Use   Vaping status: Never Used  Substance and Sexual Activity   Alcohol use: Not Currently    Comment: 2 40 oz daily. 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 Stopped liquor 5 years ago.states no etoh since 03/2022   Drug use: Yes    Types: Marijuana    Comment: occassionally   Sexual activity: Yes    Birth control/protection: None  Other Topics Concern   Not on file  Social History Narrative   Not on file   Social Drivers of Health   Financial Resource Strain: Not on file  Food Insecurity: Food Insecurity Present (06/18/2023)   Hunger Vital Sign    Worried About Running Out of Food in the Last Year: Sometimes true    Ran Out of Food in the Last Year: Sometimes true  Transportation Needs: Unmet Transportation Needs (06/18/2023)   PRAPARE - Administrator, Civil Service (Medical): Yes    Lack of Transportation (Non-Medical): Yes  Physical Activity: Not on file  Stress: Not on file  Social Connections: Not on file  Intimate Partner Violence: Not At Risk (06/18/2023)  Humiliation, Afraid, Rape, and Kick questionnaire    Fear of Current or Ex-Partner: No    Emotionally Abused: No    Physically Abused: No    Sexually Abused: No     Review of System:   General: Negative for anorexia, weight loss, fever, chills, fatigue, +weakness. Eyes: Negative for vision changes.  ENT: Negative for hoarseness, difficulty swallowing , nasal congestion. CV: Negative for chest pain, angina, palpitations, dyspnea on exertion, peripheral edema.  Respiratory: Negative for dyspnea at rest, dyspnea on exertion, cough, sputum, wheezing.  GI: See history of present illness. GU:  Negative for dysuria, hematuria, urinary incontinence, urinary frequency, nocturnal urination.  MS: Negative for joint pain, low back  pain.  Derm: Negative for rash or itching.  Neuro: Negative for  abnormal sensation, seizure, frequent headaches, memory loss, confusion.  Left-sided weakness status post stroke Psych: Negative for anxiety, depression, suicidal ideation, hallucinations.  Endo: Negative for unusual weight change.  Heme: Negative for bruising or bleeding. Allergy: Negative for rash or hives.      Physical Examination:   Vital signs in last 24 hours: Temp:  [98.1 F (36.7 C)-98.8 F (37.1 C)] 98.2 F (36.8 C) (02/21 0933) Pulse Rate:  [71-85] 82 (02/21 0933) Resp:  [15-19] 19 (02/21 0933) BP: (159-216)/(109-116) 188/114 (02/21 0933) SpO2:  [94 %-100 %] 95 % (02/21 0933) Weight:  [77 kg] 77 kg (02/20 2136)    General: Well-nourished, well-developed in no acute distress.  Sitting on the edge of the bed. Head: Normocephalic, atraumatic.   Eyes: Conjunctiva pink, no icterus. Mouth: Oropharyngeal mucosa moist and pink , no lesions erythema or exudate. Neck: Supple without thyromegaly, masses, or lymphadenopathy.  Lungs: Clear to auscultation bilaterally.  Heart: Regular rate and rhythm, no murmurs rubs or gallops.  Abdomen: Bowel sounds are normal,  nondistended, no hepatosplenomegaly or masses, no abdominal bruits or hernia , no rebound or guarding.  Moderate epigastric tenderness to deep palpation Rectal: not performed Extremities: No lower extremity edema, clubbing, deformity.  Neuro: Alert and oriented x 4   Skin: Warm and dry, no rash or jaundice.   Psych: Alert and cooperative, normal mood and affect.        Intake/Output from previous day: No intake/output data recorded. Intake/Output this shift: No intake/output data recorded.  Lab Results:   CBC Recent Labs    01/30/24 0013  WBC 11.5*  HGB 13.9  HCT 42.5  MCV 95.1  PLT 231   BMET Recent Labs    01/30/24 0013  NA 139  K 4.2  CL 107  CO2 22  GLUCOSE 154*  BUN 21*  CREATININE 2.00*  CALCIUM 9.0   LFT Recent Labs     01/30/24 0013  BILITOT 0.7  ALKPHOS 39  AST 20  ALT 12  PROT 7.0  ALBUMIN 3.8    Lipase Recent Labs    01/30/24 0013  LIPASE 3,477*    PT/INR No results for input(s): "LABPROT", "INR" in the last 72 hours.   Hepatitis Panel No results for input(s): "HEPBSAG", "HCVAB", "HEPAIGM", "HEPBIGM" in the last 72 hours.   Imaging Studies:   CT ABDOMEN PELVIS W CONTRAST Result Date: 01/30/2024 CLINICAL DATA:  Shortness of breath, back/abdominal pain EXAM: CT ABDOMEN AND PELVIS WITH CONTRAST TECHNIQUE: Multidetector CT imaging of the abdomen and pelvis was performed using the standard protocol following bolus administration of intravenous contrast. RADIATION DOSE REDUCTION: This exam was performed according to the departmental dose-optimization program which includes automated exposure control, adjustment of the  mA and/or kV according to patient size and/or use of iterative reconstruction technique. CONTRAST:  75mL OMNIPAQUE IOHEXOL 300 MG/ML  SOLN COMPARISON:  07/19/2022 FINDINGS: Lower chest: Lung bases are clear. Hepatobiliary: Liver is within normal limits. Contracted bladder with cholelithiasis, without associated inflammatory changes. No intrahepatic or extrahepatic ductal dilatation. Pancreas: Pancreatic fluid/inflammatory changes, suggesting acute pancreatitis. Mild dilatation of the main pancreatic duct extending to the pancreatic head, measuring up to 8 mm (series 2/image 24), chronic but mildly progressive. No contour deforming mass in the pancreatic head/uncinate process. No peripancreatic fluid collection/pseudocyst. Spleen: Within normal limits. Adrenals/Urinary Tract: Adrenal glands are within normal limits. Kidneys are within normal limits.  No hydronephrosis. Mildly thick-walled bladder, although underdistended. Stomach/Bowel: Stomach is within normal limits. No evidence of bowel obstruction. Normal appendix (series 2/image 33). Scattered sigmoid diverticulosis, without evidence of  diverticulitis. Vascular/Lymphatic: No evidence of abdominal aortic aneurysm. Atherosclerotic calcifications of the abdominal aorta and branch vessels, although vessels remain patent. No suspicious abdominopelvic lymphadenopathy. Reproductive: Prostate is unremarkable. Other: No abdominopelvic ascites. Musculoskeletal: Mild degenerative changes of the visualized thoracolumbar spine. IMPRESSION: Acute pancreatitis, without complication. Mild dilatation of the main pancreatic duct extending to the pancreatic head, chronic but mildly progressive. This appearance may be inflammatory given lack of contour deforming mass on CT. Follow-up MRI abdomen with/without contrast is suggested in 3-4 weeks. Cholelithiasis, without associated inflammatory changes. Electronically Signed   By: Charline Bills M.D.   On: 01/30/2024 01:07   DG Chest 2 View Result Date: 01/29/2024 CLINICAL DATA:  Shortness of breath EXAM: CHEST - 2 VIEW COMPARISON:  06/18/2023 FINDINGS: Lungs are clear.  No pleural effusion or pneumothorax. The heart is normal in size. Mild degenerative changes of the visualized thoracolumbar spine. IMPRESSION: Normal chest radiographs. Electronically Signed   By: Charline Bills M.D.   On: 01/29/2024 22:03  [4 week]  Assessment/Plan   58 y/o male with past medical history significant for hyperlipidemia, hypertension, type 2 diabetes mellitus, GERD, stroke with left-sided hemiparesis, pancreatitis (last episode in 2023), prior etoh abuse (sober since 03/2022) who presented to the emergency department due to upper abdominal pain with radiation into the back associated with nausea/vomiting. GI consulted for recurrent pancreatitis.   Pancreatitis: -appears uncomplicated at this time -Chronic mildly progressive mild dilation of the main pancreatic duct extending into the pancreatic head, will need further imaging at a later date to rule out underlying mass -No alcohol use in nearly 2 years, cannot exclude  biliary source, gallstones noted on CT imaging.   -Await ultrasound results from today -Supportive measures including fluids, pain management, antiemetics   LOS: 0 days   We would like to thank you for the opportunity to participate in the care of Taylor Obrien.  Leanna Battles. Dixon [facility]os [facility] [phone] 2/21/202510:07 AM

## 2024-01-30 NOTE — ED Notes (Addendum)
Present with pain meds as requested. Pt upset and says "get me a damn phone so I can call my ride". De-escalating communication used. Asked patient to elaborate and he says he does not want to be here anymore.   Pt refuses pain medication and IV fluids. Phone provided as requested to call for his ride.

## 2024-01-30 NOTE — ED Notes (Signed)
Pt calls out asking for pain medications. Pt told no current meds ordered but would request from physician.

## 2024-01-30 NOTE — ED Notes (Signed)
 Patient transported to CT

## 2024-01-30 NOTE — H&P (Signed)
History and Physical    Patient: Taylor Obrien:[id number] DOB: 11/03/1966 DOA: 01/29/2024 DOS: the patient was seen and examined on 01/30/2024 PCP: Renaldo Harrison, NP  Patient coming from: Home  Chief Complaint:  Chief Complaint  Patient presents with   Shortness of Breath   Cough   HPI: Taylor Obrien is a 58 y.o. male with medical history significant of hyperlipidemia, hypertension, T2DM, GERD, stroke with left-sided hemiparesis, pancreatitis who presents to the emergency department due to upper abdominal pain with radiation to the back.  Pain started yesterday in the evening while in bed, this was associated with 3-4 episodes of NBNB vomitus.  Patient thought he may have pancreatitis, so he presents to the ED for further evaluation and management.  He denies fever, chills.  ED Course:  In the emergency department, BP was 205/114, other vital signs are within normal range.  Workup in the ED showed normal CBC except for WBC of 11.5.  BMP was normal except for blood glucose 154, BUN 21, creatinine 2.0 (baseline creatinine at 1.5-1.8).  Lipase 3,477.  Influenza A, B, SARS coronavirus 2, RSV was negative. CT abdomen and pelvis with contrast was suggestive of acute pancreatitis without complication Chest x-ray showed normal chest radiographs Patient was treated with morphine, Zofran and IV hydration.  Hospitalist was asked to admit patient for further evaluation and management.   Review of Systems: Review of systems as noted in the HPI. All other systems reviewed and are negative.   Past Medical History:  Diagnosis Date   Acute alcoholic pancreatitis    Arthritis    Chronic pain    Depression    Diabetes mellitus without complication (HCC)    Essential hypertension, benign    GERD (gastroesophageal reflux disease)    Headache    HTN (hypertension) 11/27/2015   Hyperlipidemia    Sleep apnea    could not tolerate CPAP   Stroke (HCC)    2016   Past Surgical History:   Procedure Laterality Date   BIOPSY  08/19/2022   Procedure: BIOPSY;  Surgeon: Lanelle Bal, DO;  Location: [facility];  Service: Endoscopy;;   CLOSED REDUCTION MANDIBULAR FRACTURE W/ ARCH BARS     + multiple extractions   COLONOSCOPY WITH PROPOFOL N/A 08/19/2022   Procedure: COLONOSCOPY WITH PROPOFOL;  Surgeon: Lanelle Bal, DO;  Location: [facility];  Service: Endoscopy;  Laterality: N/A;  9:15am   Condyloma resection     ESOPHAGOGASTRODUODENOSCOPY (EGD) WITH PROPOFOL N/A 08/19/2022   Procedure: ESOPHAGOGASTRODUODENOSCOPY (EGD) WITH PROPOFOL;  Surgeon: Lanelle Bal, DO;  Location: [facility];  Service: Endoscopy;  Laterality: N/A;   MULTIPLE EXTRACTIONS WITH ALVEOLOPLASTY Bilateral 01/23/2018   Procedure: DENTAL EXTRACTION OF TEETH NUMBER ONE, TWO, THREE, FOUR, FIVE, SIX, SEVEN, EIGHT, NINE, TEN, ELEVEN, TWELVE, THIRTEEN, FOURTEEN, FIFTEEN, SIXTEEN, SEVENTEEN, TWENTY, TWENTY-ONE, TWENTY-TWO, TWENTY-THREE, TWENTY-FOUR, TWENTY-FIVE, TWENTY-SIX, TWENTY-SEVEN, TWENTY-EIGHT, TWENTY-NINE, THIRTY-TWO WITH ALVEOLOPLASTY;  Surgeon: Ocie Doyne, DDS;  Location: [facility];  Service: Oral Surgery;  Lat   POLYPECTOMY  08/19/2022   Procedure: POLYPECTOMY;  Surgeon: Lanelle Bal, DO;  Location: [facility];  Service: Endoscopy;;   RADIOLOGY WITH ANESTHESIA N/A 11/18/2015   Procedure: RADIOLOGY WITH ANESTHESIA;  Surgeon: Julieanne Cotton, MD;  Location: [facility];  Service: Radiology;  Laterality: N/A;   Removal foreign body right shoulder     Right rotator cuff repair     TEE WITHOUT CARDIOVERSION N/A 11/21/2015   Procedure: TRANSESOPHAGEAL ECHOCARDIOGRAM (TEE);  Surgeon: Eliot Ford  Shirlee Latch, MD;  Location: [facility] END[facility];  Service: Cardiovascular;  Laterality: N/A;   TOOTH EXTRACTION N/A 01/24/2018   Procedure: SUTURE ORAL WOUND;  Surgeon: Ocie Doyne, DDS;  Location: [facility];  Service: Oral Surgery;  Laterality: N/A;    Social History:  reports that he has been smoking cigarettes. He has  a 15 pack-year smoking history. He has never used smokeless tobacco. He reports that he does not currently use alcohol. He reports current drug use. Drug: Marijuana.   Allergies  Allergen Reactions   Ibuprofen Other (See Comments)    Avoid per PCP   Oxcarbazepine Other (See Comments)    Patient goes out of right state of mind.     Family History  Problem Relation Age of Onset   Diabetes Mother    Hypertension Mother    Drug abuse Mother    Hyperlipidemia Mother    Diabetes Father    Hypertension Father    Hyperlipidemia Father    Diabetes Brother    Hypertension Brother      Prior to Admission medications   Medication Sig Start Date End Date Taking? Authorizing Provider  amLODipine (NORVASC) 10 MG tablet Take 1 tablet (10 mg total) by mouth daily. 01/19/16   Kirsteins, Victorino Sparrow, MD  aspirin 325 MG tablet Take 1 tablet (325 mg total) by mouth daily. 12/20/15   Love, Evlyn Kanner, PA-C  atorvastatin (LIPITOR) 80 MG tablet Take 80 mg by mouth daily. 03/17/23   [provider]  cloNIDine (CATAPRES - DOSED IN MG/24 HR) 0.3 mg/24hr patch Place 1 patch (0.3 mg total) onto the skin once a week. Next change on monday Patient taking differently: Place 0.3 mg onto the skin every Wednesday. Next change on monday 12/20/15   Love, Evlyn Kanner, PA-C  diphenhydramine-acetaminophen (TYLENOL PM) 25-500 MG TABS tablet Take 1-2 tablets by mouth at bedtime as needed (for sleep). Depends on insomnia if takes 1-2 tablets    [provider]  empagliflozin (JARDIANCE) 25 MG TABS tablet Take 25 mg by mouth daily.     [provider]  escitalopram (LEXAPRO) 10 MG tablet Take 10 mg by mouth daily.    [provider]  fenofibrate (TRICOR) 145 MG tablet Take 145 mg by mouth daily. 04/10/22   [provider]  gabapentin (NEURONTIN) 100 MG capsule Take 100-300 mg by mouth every morning. 100mg  in am, 300mg  pm 01/24/23   [provider]  hydrOXYzine (ATARAX) 25 MG tablet Take  25 mg by mouth every 8 (eight) hours as needed for anxiety. 04/17/22   [provider]  labetalol (NORMODYNE) 300 MG tablet Take 300 mg by mouth 2 (two) times daily. 03/08/22   [provider]  lisinopril (ZESTRIL) 10 MG tablet Take 10 mg by mouth daily. 06/18/22   [provider]  oxyCODONE (OXY IR/ROXICODONE) 5 MG immediate release tablet Take 5 mg by mouth every 8 (eight) hours. 06/28/22   [provider]  pantoprazole (PROTONIX) 40 MG tablet Take 1 tablet (40 mg total) by mouth at bedtime. 01/19/16   Kirsteins, Victorino Sparrow, MD  pregabalin (LYRICA) 50 MG capsule Take 50 mg by mouth 2 (two) times daily. 05/14/23   [provider]  tiZANidine (ZANAFLEX) 2 MG tablet Take 2 mg by mouth 2 (two) times daily. 03/19/23   [provider]  traZODone (DESYREL) 100 MG tablet Take 1 tablet (100 mg total) by mouth at bedtime. 01/19/16   Kirsteins, Victorino Sparrow, MD  triamcinolone cream (KENALOG) 0.1 %  Apply 1 application. topically daily.    [provider]  VASCEPA 1 g capsule Take 2 g by mouth 2 (two) times daily. 03/12/22   [provider]    Physical Exam: BP (!) 159/109   Pulse 77   Temp 98.1 F (36.7 C) (Oral)   Resp 18   Ht 5\' 5"  (1.651 m)   Wt 77 kg   SpO2 94%   BMI 28.25 kg/m   General: 58 y.o. year-old male well developed well nourished in no acute distress.  Alert and oriented x3. HEENT: NCAT, EOMI Neck: Supple, trachea medial Cardiovascular: Regular rate and rhythm with no rubs or gallops.  No thyromegaly or JVD noted.  No lower extremity edema. 2/4 pulses in all 4 extremities. Respiratory: Clear to auscultation with no wheezes or rales. Good inspiratory effort. Abdomen: Soft, tender to palpation of abdomen without guarding.  Nondistended with normal bowel sounds x4 quadrants. Muskuloskeletal: No cyanosis, clubbing or edema noted bilaterally Neuro: CN II-XII intact, strength 5/5 x 4, sensation, reflexes intact Skin: No ulcerative  lesions noted or rashes Psychiatry: Judgement and insight appear normal. Mood is appropriate for condition and setting          Labs on Admission:  Basic Metabolic Panel: Recent Labs  Lab 01/30/24 0013  NA 139  K 4.2  CL 107  CO2 22  GLUCOSE 154*  BUN 21*  CREATININE 2.00*  CALCIUM 9.0  MG 1.8  PHOS 3.0   Liver Function Tests: Recent Labs  Lab 01/30/24 0013  AST 20  ALT 12  ALKPHOS 39  BILITOT 0.7  PROT 7.0  ALBUMIN 3.8   Recent Labs  Lab 01/30/24 0013  LIPASE 3,477*   No results for input(s): "AMMONIA" in the last 168 hours. CBC: Recent Labs  Lab 01/30/24 0013  WBC 11.5*  NEUTROABS 9.6*  HGB 13.9  HCT 42.5  MCV 95.1  PLT 231   Cardiac Enzymes: No results for input(s): "CKTOTAL", "CKMB", "CKMBINDEX", "TROPONINI" in the last 168 hours.  BNP (last 3 results) No results for input(s): "BNP" in the last 8760 hours.  ProBNP (last 3 results) No results for input(s): "PROBNP" in the last 8760 hours.  CBG: No results for input(s): "GLUCAP" in the last 168 hours.  Radiological Exams on Admission: CT ABDOMEN PELVIS W CONTRAST Result Date: 01/30/2024 CLINICAL DATA:  Shortness of breath, back/abdominal pain EXAM: CT ABDOMEN AND PELVIS WITH CONTRAST TECHNIQUE: Multidetector CT imaging of the abdomen and pelvis was performed using the standard protocol following bolus administration of intravenous contrast. RADIATION DOSE REDUCTION: This exam was performed according to the departmental dose-optimization program which includes automated exposure control, adjustment of the mA and/or kV according to patient size and/or use of iterative reconstruction technique. CONTRAST:  75mL OMNIPAQUE IOHEXOL 300 MG/ML  SOLN COMPARISON:  07/19/2022 FINDINGS: Lower chest: Lung bases are clear. Hepatobiliary: Liver is within normal limits. Contracted bladder with cholelithiasis, without associated inflammatory changes. No intrahepatic or extrahepatic ductal dilatation. Pancreas:  Pancreatic fluid/inflammatory changes, suggesting acute pancreatitis. Mild dilatation of the main pancreatic duct extending to the pancreatic head, measuring up to 8 mm (series 2/image 24), chronic but mildly progressive. No contour deforming mass in the pancreatic head/uncinate process. No peripancreatic fluid collection/pseudocyst. Spleen: Within normal limits. Adrenals/Urinary Tract: Adrenal glands are within normal limits. Kidneys are within normal limits.  No hydronephrosis. Mildly thick-walled bladder, although underdistended. Stomach/Bowel: Stomach is within normal limits. No evidence of bowel obstruction. Normal appendix (series 2/image 33). Scattered sigmoid diverticulosis, without evidence  of diverticulitis. Vascular/Lymphatic: No evidence of abdominal aortic aneurysm. Atherosclerotic calcifications of the abdominal aorta and branch vessels, although vessels remain patent. No suspicious abdominopelvic lymphadenopathy. Reproductive: Prostate is unremarkable. Other: No abdominopelvic ascites. Musculoskeletal: Mild degenerative changes of the visualized thoracolumbar spine. IMPRESSION: Acute pancreatitis, without complication. Mild dilatation of the main pancreatic duct extending to the pancreatic head, chronic but mildly progressive. This appearance may be inflammatory given lack of contour deforming mass on CT. Follow-up MRI abdomen with/without contrast is suggested in 3-4 weeks. Cholelithiasis, without associated inflammatory changes. Electronically Signed   By: Charline Bills M.D.   On: 01/30/2024 01:07   DG Chest 2 View Result Date: 01/29/2024 CLINICAL DATA:  Shortness of breath EXAM: CHEST - 2 VIEW COMPARISON:  06/18/2023 FINDINGS: Lungs are clear.  No pleural effusion or pneumothorax. The heart is normal in size. Mild degenerative changes of the visualized thoracolumbar spine. IMPRESSION: Normal chest radiographs. Electronically Signed   By: Charline Bills M.D.   On: 01/29/2024 22:03     EKG: I independently viewed the EKG done and my findings are as followed: Normal sinus rhythm at a rate of 75 bpm  Assessment/Plan Present on Admission:  Acute pancreatitis  Essential hypertension  Tobacco abuse  Mixed hyperlipidemia  Depression  Principal Problem:   Acute pancreatitis Active Problems:   Mixed hyperlipidemia   Tobacco abuse   Essential hypertension   Depression   Hypertensive urgency   Chronic kidney disease, stage 3b (HCC)   Type 2 diabetes mellitus with hyperglycemia (HCC)   History of right ACA stroke   Anxiety  Acute Pancreatitis Continue IV Zofran p.r.n Continue IV morphine p.r.n for pain Continue Protonix Continue IV LR at 166ml/Hr Continue full liquid diet with plan to advance diet as tolerated RUQ U/S in the morning to investigate biliary etiology (gallstone and bile duct dilatation  Hypertensive urgency (resolved) Essential hypertension Continue amlodipine, lisinopril and clonidine  Chronic kidney disease stage IIIb Creatinine 2.0 (baseline creatinine at 1.5-1.8) Renally adjust medications, avoid nephrotoxic agents/dehydration/hypotension  Type II diabetes mellitus with hyperglycemia 06/18/2023 hemoglobin A1c 5.9 Metformin, Jardiance will be held at this time Continue ISS and hypoglycemia protocol   Tobacco abuse She was counseled on tobacco cessation    History of right ACA stroke Continue aspirin, Lipitor  Mixed hyperlipidemia Continue Lipitor, fenofibrate   Anxiety/depression Continue Lexapro and hydroxyzine  DVT prophylaxis: Lovenox  Family Communication: None at bedside  Advance Care Planning:   Code Status: Full Code   Consults: None  Severity of Illness: The appropriate patient status for this patient is INPATIENT. Inpatient status is judged to be reasonable and necessary in order to provide the required intensity of service to ensure the patient's safety. The patient's presenting symptoms, physical exam findings,  and initial radiographic and laboratory data in the context of their chronic comorbidities is felt to place them at high risk for further clinical deterioration. Furthermore, it is not anticipated that the patient will be medically stable for discharge from the hospital within 2 midnights of admission.   * I certify that at the point of admission it is my clinical judgment that the patient will require inpatient hospital care spanning beyond 2 midnights from the point of admission due to high intensity of service, high risk for further deterioration and high frequency of surveillance required.*  Author: Frankey Shown, DO 01/30/2024 7:12 AM  For on call review [email or web address].

## 2024-01-30 NOTE — Progress Notes (Signed)
Taylor Obrien is a 58 y.o. male with medical history significant of hyperlipidemia, hypertension, T2DM, GERD, stroke with left-sided hemiparesis, pancreatitis who presents to the emergency department due to upper abdominal pain with radiation to the back.  He has been admitted with acute uncomplicated pancreatitis and denies alcohol use in the last 2 years.  Appears to have gallstones noted on imaging, but right upper quadrant ultrasound does not demonstrate any acute abnormalities.  GI consulted with further evaluation pending.  Patient seen and evaluated at bedside and has been admitted after midnight.  Remains on full liquid diet and some IV fluid for now.  Total care time: 30 minutes.

## 2024-01-30 NOTE — TOC CM/SW Note (Signed)
Transition of Care [facility]) - Inpatient Brief Assessment   Patient Details  Name: Taylor Obrien MRN: [id number] Date of Birth: 03-01-1966  Transition of Care [facility]) CM/SW Contact:    Villa Herb, LCSWA Phone Number: 01/30/2024, 10:37 AM   Clinical Narrative: Transition of Care Department [facility]) has reviewed patient and no TOC needs have been identified at this time. We will continue to monitor patient advancement through interdiciplinary progression rounds. If new patient transition needs arise, please place a TOC consult.   Transition of Care Asessment: Insurance and Status: Insurance coverage has been reviewed Patient has primary care physician: Yes Home environment has been reviewed: From home Prior level of function:: Independent Prior/Current Home Services: No current home services Social Drivers of Health Review: SDOH reviewed no interventions necessary Readmission risk has been reviewed: Yes Transition of care needs: no transition of care needs at this time

## 2024-01-30 NOTE — ED Notes (Signed)
 Pt sitting up in bed eating breakfast

## 2024-01-31 DIAGNOSIS — K828 Other specified diseases of gallbladder: Secondary | ICD-10-CM

## 2024-01-31 DIAGNOSIS — K8689 Other specified diseases of pancreas: Secondary | ICD-10-CM | POA: Diagnosis not present

## 2024-01-31 DIAGNOSIS — K859 Acute pancreatitis without necrosis or infection, unspecified: Secondary | ICD-10-CM | POA: Diagnosis not present

## 2024-01-31 LAB — COMPREHENSIVE METABOLIC PANEL
ALT: 10 U/L (ref 0–44)
AST: 22 U/L (ref 15–41)
Albumin: 3.4 g/dL — ABNORMAL LOW (ref 3.5–5.0)
Alkaline Phosphatase: 33 U/L — ABNORMAL LOW (ref 38–126)
Anion gap: 6 (ref 5–15)
BUN: 17 mg/dL (ref 6–20)
CO2: 24 mmol/L (ref 22–32)
Calcium: 8.8 mg/dL — ABNORMAL LOW (ref 8.9–10.3)
Chloride: 107 mmol/L (ref 98–111)
Creatinine, Ser: 1.94 mg/dL — ABNORMAL HIGH (ref 0.61–1.24)
GFR, Estimated: 40 mL/min — ABNORMAL LOW (ref >60)
Glucose, Bld: 99 mg/dL (ref 70–99)
Potassium: 3.9 mmol/L (ref 3.5–5.1)
Sodium: 137 mmol/L (ref 135–145)
Total Bilirubin: 0.6 mg/dL (ref 0.0–1.2)
Total Protein: 6.4 g/dL — ABNORMAL LOW (ref 6.5–8.1)

## 2024-01-31 LAB — CBC
HCT: 37.4 % — ABNORMAL LOW (ref 39.0–52.0)
Hemoglobin: 12.3 g/dL — ABNORMAL LOW (ref 13.0–17.0)
MCH: 31.4 pg (ref 26.0–34.0)
MCHC: 32.9 g/dL (ref 30.0–36.0)
MCV: 95.4 fL (ref 80.0–100.0)
Platelets: 228 10*3/uL (ref 150–400)
RBC: 3.92 MIL/uL — ABNORMAL LOW (ref 4.22–5.81)
RDW: 14.6 % (ref 11.5–15.5)
WBC: 5.9 10*3/uL (ref 4.0–10.5)
nRBC: 0 % (ref 0.0–0.2)

## 2024-01-31 LAB — MAGNESIUM: Magnesium: 1.7 mg/dL (ref 1.7–2.4)

## 2024-01-31 LAB — GLUCOSE, CAPILLARY
Glucose-Capillary: 106 mg/dL — ABNORMAL HIGH (ref 70–99)
Glucose-Capillary: 206 mg/dL — ABNORMAL HIGH (ref 70–99)

## 2024-01-31 LAB — LIPASE, BLOOD: Lipase: 109 U/L — ABNORMAL HIGH (ref 11–51)

## 2024-01-31 MED ORDER — CLONIDINE HCL 0.3 MG/24HR TD PTWK: 0.3000 mg | MEDICATED_PATCH | TRANSDERMAL | 12 refills | Status: DC

## 2024-01-31 MED ORDER — LABETALOL HCL 200 MG PO TABS
300.0000 mg | ORAL_TABLET | Freq: Two times a day (BID) | ORAL | Status: DC
  Administered 2024-01-31: 300 mg via ORAL
  Filled 2024-01-31: qty 2

## 2024-01-31 MED ORDER — LISINOPRIL 10 MG PO TABS
20.0000 mg | ORAL_TABLET | Freq: Every day | ORAL | Status: DC
  Administered 2024-01-31: 20 mg via ORAL
  Filled 2024-01-31: qty 2

## 2024-01-31 MED ORDER — GABAPENTIN 300 MG PO CAPS: 300.0000 mg | ORAL_CAPSULE | Freq: Every evening | ORAL | Status: DC

## 2024-01-31 MED ORDER — ESCITALOPRAM OXALATE 10 MG PO TABS
20.0000 mg | ORAL_TABLET | Freq: Every day | ORAL | Status: DC
  Administered 2024-01-31: 20 mg via ORAL
  Filled 2024-01-31: qty 2

## 2024-01-31 NOTE — Discharge Summary (Signed)
 Physician Discharge Summary  Taylor Obrien WGN:[id number] DOB: 02-15-1966 DOA: 01/29/2024  PCP: Renaldo Harrison, NP  Admit date: 01/29/2024  Discharge date: 01/31/2024  Admitted From:Home  Disposition:  Home  Recommendations for Outpatient Follow-up:  Follow up with PCP in 1-2 weeks Follow-up with GI outpatient for MRI in approximately 8 weeks Continue on home medications as prior and monitor blood pressures carefully and follow-up with PCP  Home Health: None  Equipment/Devices: None  Discharge Condition:Stable  CODE STATUS: Full  Diet recommendation: Carb modified/low-fat diet  Brief/Interim Summary: Taylor Obrien is a 58 y.o. male with medical history significant of hyperlipidemia, hypertension, T2DM, GERD, stroke with left-sided hemiparesis, pancreatitis who presents to the emergency department due to upper abdominal pain with radiation to the back.  He has been admitted with acute uncomplicated pancreatitis and denies alcohol use in the last 2 years.  Appears to have gallstones noted on imaging, but right upper quadrant ultrasound does not demonstrate any acute abnormalities.  GI consulted with plans to follow-up outpatient at this point as he has clinically done quite well.  He is not tolerating diet with no further pain.  Plan for outpatient follow-up per GI as noted above.  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  Discharge Instructions  Discharge Instructions     Diet - low sodium heart healthy   Complete by: As directed    Increase activity slowly   Complete by:  As directed       Allergies as of 01/31/2024       Reactions   Ibuprofen Other (See Comments)   Avoid per PCP   Oxcarbazepine Other (See Comments)   Patient goes out of right state of mind.         Medication List     TAKE these medications    amLODipine 10 MG tablet Commonly known as: NORVASC Take 1 tablet (10 mg total) by mouth daily.   aspirin 325 MG tablet Take 1 tablet (325 mg total) by mouth daily.   atorvastatin 80 MG tablet Commonly known as: LIPITOR Take 80 mg by mouth at bedtime.   cloNIDine 0.3 mg/24hr patch Commonly known as: CATAPRES - Dosed in mg/24 hr Place 0.3 mg onto the skin once a week.   cloNIDine 0.3 mg/24hr patch Commonly known as: CATAPRES - Dosed in mg/24 hr Place 1 patch (0.3 mg total) onto the skin once a week. Next change on monday What changed: when to take this   diphenhydramine-acetaminophen 25-500 MG Tabs tablet Commonly known as: TYLENOL PM Take 1-2 tablets by mouth at bedtime as needed (for sleep). Depends on insomnia if takes 1-2 tablets   empagliflozin 25 MG Tabs tablet Commonly known as: JARDIANCE Take 25 mg by mouth daily.   escitalopram 20 MG tablet Commonly known as: LEXAPRO Take 20 mg by mouth daily.   fenofibrate 145 MG tablet Commonly known as: TRICOR Take 145 mg by mouth daily.   gabapentin 300 MG capsule Commonly known as: NEURONTIN Take 300 mg by mouth every evening.   hydrOXYzine 25 MG tablet Commonly known as: ATARAX Take 25 mg by mouth  every 8 (eight) hours as needed for anxiety.   ibuprofen 600 MG tablet Commonly known as: ADVIL Take 600 mg by mouth every 6 (six) hours as needed for moderate pain (pain score 4-6).   labetalol 300 MG tablet Commonly known as: NORMODYNE Take 300 mg by mouth 2 (two) times daily.   lisinopril 20 MG tablet Commonly known as: ZESTRIL Take 20 mg by mouth daily.   oxyCODONE 5 MG immediate release tablet Commonly known as: Oxy IR/ROXICODONE Take 5 mg by mouth every 8  (eight) hours as needed for moderate pain (pain score 4-6).   pantoprazole 40 MG tablet Commonly known as: PROTONIX Take 1 tablet (40 mg total) by mouth at bedtime.   pregabalin 50 MG capsule Commonly known as: LYRICA Take 50 mg by mouth 2 (two) times daily.   traZODone 50 MG tablet Commonly known as: DESYREL Take 50 mg by mouth at bedtime.   triamcinolone cream 0.1 % Commonly known as: KENALOG Apply 1 application  topically daily as needed (Deratitis).   Vascepa 1 g capsule Generic drug: icosapent Ethyl Take 2 g by mouth 2 (two) times daily.        Follow-up Information     Renaldo Harrison, NP. Schedule an appointment as soon as possible for a visit in 1 week(s).   Specialty: Nurse Practitioner Contact information: [address] [place] [address] [phone]         [facility]. Go to.   Contact information: [address] [place] [address] [phone]               Allergies  Allergen Reactions   Ibuprofen Other (See Comments)    Avoid per PCP   Oxcarbazepine Other (See Comments)    Patient goes out of right state of mind.     Consultations: GI   Procedures/Studies: US Abdomen Limited Result Date: 01/30/2024 CLINICAL DATA:  58 year old male with abdominal pain.  Pancreatitis. EXAM: ULTRASOUND ABDOMEN LIMITED RIGHT UPPER QUADRANT COMPARISON:  CT Abdomen and Pelvis 0058 hours today. FINDINGS: Gallbladder: Better distended gallbladder now. Evidence of adenomyomatosis (image 9). Gallbladder wall otherwise within normal limits. No sludge or stones. No sonographic Murphy sign elicited. Common bile duct: Diameter: 3 mm, normal. Liver: No focal lesion identified. Within normal limits in parenchymal echogenicity. Portal vein is patent on color Doppler imaging with normal direction of blood flow towards the liver. Other: Indistinct appearance of the right kidney, echogenic right renal cortex (image 35).  No evidence of right hydronephrosis. No right upper quadrant free fluid. IMPRESSION: 1. Negative gallbladder aside from evidence of adenomyomatosis. 2. Negative ultrasound appearance of the liver, no evidence of bile duct obstruction. 3. Evidence of chronic right medical renal disease. Electronically Signed   By: Odessa Fleming M.D.   On: 01/30/2024 12:38   CT ABDOMEN PELVIS W CONTRAST Result Date: 01/30/2024 CLINICAL DATA:  Shortness of breath, back/abdominal pain EXAM: CT ABDOMEN AND PELVIS WITH CONTRAST TECHNIQUE: Multidetector CT imaging of the abdomen and pelvis was performed using the standard protocol following bolus administration of intravenous contrast. RADIATION DOSE REDUCTION: This exam was performed according to the departmental dose-optimization program which includes automated exposure control, adjustment of the mA and/or kV according to patient size and/or use of iterative reconstruction technique. CONTRAST:  75mL OMNIPAQUE IOHEXOL 300 MG/ML  SOLN COMPARISON:  07/19/2022 FINDINGS: Lower chest: Lung bases are clear. Hepatobiliary: Liver is within normal limits. Contracted bladder with cholelithiasis, without associated inflammatory changes. No intrahepatic or extrahepatic ductal  dilatation. Pancreas: Pancreatic fluid/inflammatory changes, suggesting acute pancreatitis. Mild dilatation of the main pancreatic duct extending to the pancreatic head, measuring up to 8 mm (series 2/image 24), chronic but mildly progressive. No contour deforming mass in the pancreatic head/uncinate process. No peripancreatic fluid collection/pseudocyst. Spleen: Within normal limits. Adrenals/Urinary Tract: Adrenal glands are within normal limits. Kidneys are within normal limits.  No hydronephrosis. Mildly thick-walled bladder, although underdistended. Stomach/Bowel: Stomach is within normal limits. No evidence of bowel obstruction. Normal appendix (series 2/image 33). Scattered sigmoid diverticulosis, without evidence of  diverticulitis. Vascular/Lymphatic: No evidence of abdominal aortic aneurysm. Atherosclerotic calcifications of the abdominal aorta and branch vessels, although vessels remain patent. No suspicious abdominopelvic lymphadenopathy. Reproductive: Prostate is unremarkable. Other: No abdominopelvic ascites. Musculoskeletal: Mild degenerative changes of the visualized thoracolumbar spine. IMPRESSION: Acute pancreatitis, without complication. Mild dilatation of the main pancreatic duct extending to the pancreatic head, chronic but mildly progressive. This appearance may be inflammatory given lack of contour deforming mass on CT. Follow-up MRI abdomen with/without contrast is suggested in 3-4 weeks. Cholelithiasis, without associated inflammatory changes. Electronically Signed   By: Charline Bills M.D.   On: 01/30/2024 01:07   DG Chest 2 View Result Date: 01/29/2024 CLINICAL DATA:  Shortness of breath EXAM: CHEST - 2 VIEW COMPARISON:  06/18/2023 FINDINGS: Lungs are clear.  No pleural effusion or pneumothorax. The heart is normal in size. Mild degenerative changes of the visualized thoracolumbar spine. IMPRESSION: Normal chest radiographs. Electronically Signed   By: Charline Bills M.D.   On: 01/29/2024 22:03     Discharge Exam: Vitals:   01/31/24 0356 01/31/24 0847  BP: (!) 145/109 (!) 169/101  Pulse: 79 71  Resp: 12 16  Temp: 98.6 F (37 C) 98.7 F (37.1 C)  SpO2: 99% 99%   Vitals:   01/30/24 2026 01/30/24 2343 01/31/24 0356 01/31/24 0847  BP: (!) 185/130 (!) 164/105 (!) 145/109 (!) 169/101  Pulse: 84 78 79 71  Resp: 20 12 12 16   Temp: 99.3 F (37.4 C) 98.1 F (36.7 C) 98.6 F (37 C) 98.7 F (37.1 C)  TempSrc: Oral Oral Oral Oral  SpO2: 100% 99% 99% 99%  Weight:      Height:        General: Pt is alert, awake, not in acute distress Cardiovascular: RRR, S1/S2 +, no rubs, no gallops Respiratory: CTA bilaterally, no wheezing, no rhonchi Abdominal: Soft, NT, ND, bowel sounds  + Extremities: no edema, no cyanosis    The results of significant diagnostics from this hospitalization (including imaging, microbiology, ancillary and laboratory) are listed below for reference.     Microbiology: Recent Results (from the past 240 hours)  Resp panel by RT-PCR (RSV, Flu A&B, Covid) Anterior Nasal Swab     Status: None   Collection Time: 01/29/24  9:41 PM   Specimen: Anterior Nasal Swab  Result Value Ref Range Status   SARS Coronavirus 2 by RT PCR NEGATIVE NEGATIVE Final    Comment: (NOTE) SARS-CoV-2 target nucleic acids are NOT DETECTED.  The SARS-CoV-2 RNA is generally detectable in upper respiratory specimens during the acute phase of infection. The lowest concentration of SARS-CoV-2 viral copies this assay can detect is 138 copies/mL. A negative result does not preclude SARS-Cov-2 infection and should not be used as the sole basis for treatment or other patient management decisions. A negative result may occur with  improper specimen collection/handling, submission of specimen other than nasopharyngeal swab, presence of viral mutation(s) within the areas targeted by this assay, and  inadequate number of viral copies(<138 copies/mL). A negative result must be combined with clinical observations, patient history, and epidemiological information. The expected result is Negative.  Fact Sheet for Patients:  BloggerCourse.com  Fact Sheet for Healthcare Providers:  SeriousBroker.it  This test is no t yet approved or cleared by the [place] FDA and  has been authorized for detection and/or diagnosis of SARS-CoV-2 by FDA under an Emergency Use Authorization (EUA). This EUA will remain  in effect (meaning this test can be used) for the duration of the COVID-19 declaration under Section 564(b)(1) of the Act, 21 U.S.C.section 360bbb-3(b)(1), unless the authorization is terminated  or revoked sooner.        Influenza A by PCR NEGATIVE NEGATIVE Final   Influenza B by PCR NEGATIVE NEGATIVE Final    Comment: (NOTE) The Xpert Xpress SARS-CoV-2/FLU/RSV plus assay is intended as an aid in the diagnosis of influenza from Nasopharyngeal swab specimens and should not be used as a sole basis for treatment. Nasal washings and aspirates are unacceptable for Xpert Xpress SARS-CoV-2/FLU/RSV testing.  Fact Sheet for Patients: BloggerCourse.com  Fact Sheet for Healthcare Providers: SeriousBroker.it  This test is not yet approved or cleared by the [place] FDA and has been authorized for detection and/or diagnosis of SARS-CoV-2 by FDA under an Emergency Use Authorization (EUA). This EUA will remain in effect (meaning this test can be used) for the duration of the COVID-19 declaration under Section 564(b)(1) of the Act, 21 U.S.C. section 360bbb-3(b)(1), unless the authorization is terminated or revoked.     Resp Syncytial Virus by PCR NEGATIVE NEGATIVE Final    Comment: (NOTE) Fact Sheet for Patients: BloggerCourse.com  Fact Sheet for Healthcare Providers: SeriousBroker.it  This test is not yet approved or cleared by the [place] FDA and has been authorized for detection and/or diagnosis of SARS-CoV-2 by FDA under an Emergency Use Authorization (EUA). This EUA will remain in effect (meaning this test can be used) for the duration of the COVID-19 declaration under Section 564(b)(1) of the Act, 21 U.S.C. section 360bbb-3(b)(1), unless the authorization is terminated or revoked.  Performed at [facility], [address], [place], [place] [address]      Labs: BNP (last 3 results) No results for input(s): "BNP" in the last 8760 hours. Basic Metabolic Panel: Recent Labs  Lab 01/30/24 0013 01/31/24 0314  NA 139 137  K 4.2 3.9  CL 107 107  CO2 22 24  GLUCOSE 154* 99  BUN 21* 17   CREATININE 2.00* 1.94*  CALCIUM 9.0 8.8*  MG 1.8 1.7  PHOS 3.0  --    Liver Function Tests: Recent Labs  Lab 01/30/24 0013 01/31/24 0314  AST 20 22  ALT 12 10  ALKPHOS 39 33*  BILITOT 0.7 0.6  PROT 7.0 6.4*  ALBUMIN 3.8 3.4*   Recent Labs  Lab 01/30/24 0013 01/31/24 0314  LIPASE 3,477* 109*   No results for input(s): "AMMONIA" in the last 168 hours. CBC: Recent Labs  Lab 01/30/24 0013 01/31/24 0314  WBC 11.5* 5.9  NEUTROABS 9.6*  --   HGB 13.9 12.3*  HCT 42.5 37.4*  MCV 95.1 95.4  PLT 231 228   Cardiac Enzymes: No results for input(s): "CKTOTAL", "CKMB", "CKMBINDEX", "TROPONINI" in the last 168 hours. BNP: Invalid input(s): "POCBNP" CBG: Recent Labs  Lab 01/30/24 0931 01/30/24 1210 01/30/24 1628 01/30/24 2029 01/31/24 0712  GLUCAP 100* 137* 74 142* 106*   D-Dimer No results for input(s): "DDIMER" in the last 72 hours.  Hgb A1c Recent Labs    01/30/24 0721  HGBA1C 6.7*   Lipid Profile Recent Labs    01/30/24 0925  CHOL 278*  HDL 46  LDLCALC 190*  TRIG 210*  CHOLHDL 6.0   Thyroid function studies No results for input(s): "TSH", "T4TOTAL", "T3FREE", "THYROIDAB" in the last 72 hours.  Invalid input(s): "FREET3" Anemia work up No results for input(s): "VITAMINB12", "FOLATE", "FERRITIN", "TIBC", "IRON", "RETICCTPCT" in the last 72 hours. Urinalysis    Component Value Date/Time   COLORURINE YELLOW 06/14/2022 1504   APPEARANCEUR CLEAR 06/14/2022 1504   LABSPEC 1.006 06/14/2022 1504   PHURINE 6.0 06/14/2022 1504   GLUCOSEU >=500 (A) 06/14/2022 1504   HGBUR NEGATIVE 06/14/2022 1504   BILIRUBINUR NEGATIVE 06/14/2022 1504   KETONESUR NEGATIVE 06/14/2022 1504   PROTEINUR 100 (A) 06/14/2022 1504   UROBILINOGEN 0.2 09/22/2015 0750   NITRITE NEGATIVE 06/14/2022 1504   LEUKOCYTESUR NEGATIVE 06/14/2022 1504   Sepsis Labs Recent Labs  Lab 01/30/24 0013 01/31/24 0314  WBC 11.5* 5.9   Microbiology Recent Results (from the past 240 hours)   Resp panel by RT-PCR (RSV, Flu A&B, Covid) Anterior Nasal Swab     Status: None   Collection Time: 01/29/24  9:41 PM   Specimen: Anterior Nasal Swab  Result Value Ref Range Status   SARS Coronavirus 2 by RT PCR NEGATIVE NEGATIVE Final    Comment: (NOTE) SARS-CoV-2 target nucleic acids are NOT DETECTED.  The SARS-CoV-2 RNA is generally detectable in upper respiratory specimens during the acute phase of infection. The lowest concentration of SARS-CoV-2 viral copies this assay can detect is 138 copies/mL. A negative result does not preclude SARS-Cov-2 infection and should not be used as the sole basis for treatment or other patient management decisions. A negative result may occur with  improper specimen collection/handling, submission of specimen other than nasopharyngeal swab, presence of viral mutation(s) within the areas targeted by this assay, and inadequate number of viral copies(<138 copies/mL). A negative result must be combined with clinical observations, patient history, and epidemiological information. The expected result is Negative.  Fact Sheet for Patients:  BloggerCourse.com  Fact Sheet for Healthcare Providers:  SeriousBroker.it  This test is no t yet approved or cleared by the [place] FDA and  has been authorized for detection and/or diagnosis of SARS-CoV-2 by FDA under an Emergency Use Authorization (EUA). This EUA will remain  in effect (meaning this test can be used) for the duration of the COVID-19 declaration under Section 564(b)(1) of the Act, 21 U.S.C.section 360bbb-3(b)(1), unless the authorization is terminated  or revoked sooner.       Influenza A by PCR NEGATIVE NEGATIVE Final   Influenza B by PCR NEGATIVE NEGATIVE Final    Comment: (NOTE) The Xpert Xpress SARS-CoV-2/FLU/RSV plus assay is intended as an aid in the diagnosis of influenza from Nasopharyngeal swab specimens and should not be used  as a sole basis for treatment. Nasal washings and aspirates are unacceptable for Xpert Xpress SARS-CoV-2/FLU/RSV testing.  Fact Sheet for Patients: BloggerCourse.com  Fact Sheet for Healthcare Providers: SeriousBroker.it  This test is not yet approved or cleared by the [place] FDA and has been authorized for detection and/or diagnosis of SARS-CoV-2 by FDA under an Emergency Use Authorization (EUA). This EUA will remain in effect (meaning this test can be used) for the duration of the COVID-19 declaration under Section 564(b)(1) of the Act, 21 U.S.C. section 360bbb-3(b)(1), unless the authorization is terminated or revoked.     Resp Syncytial  Virus by PCR NEGATIVE NEGATIVE Final    Comment: (NOTE) Fact Sheet for Patients: BloggerCourse.com  Fact Sheet for Healthcare Providers: SeriousBroker.it  This test is not yet approved or cleared by the [place] FDA and has been authorized for detection and/or diagnosis of SARS-CoV-2 by FDA under an Emergency Use Authorization (EUA). This EUA will remain in effect (meaning this test can be used) for the duration of the COVID-19 declaration under Section 564(b)(1) of the Act, 21 U.S.C. section 360bbb-3(b)(1), unless the authorization is terminated or revoked.  Performed at [facility], [address] [place], [place] [address]      Time coordinating discharge: 35 minutes  SIGNED:   Erick Blinks, DO [name] 01/31/2024, 10:10 AM  If 7PM-7AM, please contact night-coverage [email or web address]

## 2024-01-31 NOTE — Progress Notes (Signed)
 Patient states abdominal pain has resolved he was hungry.  I discussed overnight progress with nursing staff early this morning.  I put an order for a heart healthy diet. By the time I saw him this morning he had not received any breakfast tray -discussed with nursing staff  Vital signs in last 24 hours: Temp:  [98.1 F (36.7 C)-99.3 F (37.4 C)] 98.6 F (37 C) (02/22 0356) Pulse Rate:  [74-84] 79 (02/22 0356) Resp:  [12-20] 12 (02/22 0356) BP: (145-188)/(105-130) 145/109 (02/22 0356) SpO2:  [95 %-100 %] 99 % (02/22 0356) Last BM Date : 01/28/24 General:   Alert,  Well-developed, well-nourished, pleasant and cooperative in NAD Abdomen: Obese.  Soft and nontender   Intake/Output from previous day: 02/21 0701 - 02/22 0700 In: 840 [P.O.:840] Out: 100 [Urine:100] Intake/Output this shift: Total I/O In: -  Out: 300 [Urine:300]  Lab Results: Recent Labs    01/30/24 0013 01/31/24 0314  WBC 11.5* 5.9  HGB 13.9 12.3*  HCT 42.5 37.4*  PLT 231 228   BMET Recent Labs    01/30/24 0013 01/31/24 0314  NA 139 137  K 4.2 3.9  CL 107 107  CO2 22 24  GLUCOSE 154* 99  BUN 21* 17  CREATININE 2.00* 1.94*  CALCIUM 9.0 8.8*   LFT Recent Labs    01/31/24 0314  PROT 6.4*  ALBUMIN 3.4*  AST 22  ALT 10  ALKPHOS 33*  BILITOT 0.6   PT/INR No results for input(s): "LABPROT", "INR" in the last 72 hours. Hepatitis Panel No results for input(s): "HEPBSAG", "HCVAB", "HEPAIGM", "HEPBIGM" in the last 72 hours. C-Diff No results for input(s): "CDIFFTOX" in the last 72 hours.  Impression:  Pleasant 58 year old gentleman admitted to the hospital with acute uncomplicated pancreatitis-etiology undetermined.  Clinically much improved.  Diet advanced today. Progressive dilation of the pancreatic duct is depicted on CT.  This is of uncertain significance.  Gall bladder with adenomyomatosis.  Recommendations:  -If he tolerates diet, he can probably be discharged later today from a GI  standpoint  -I'll arrange follow-up in our office.  -At a minimum he needs MRI of his pancreas in about 8 weeks -I will arrange  -No need for cholecystectomy at this time

## 2024-01-31 NOTE — Plan of Care (Signed)
  Problem: Coping: Goal: Ability to adjust to condition or change in health will improve Outcome: Progressing   Problem: Fluid Volume: Goal: Ability to maintain a balanced intake and output will improve Outcome: Progressing   Problem: Nutritional: Goal: Maintenance of adequate nutrition will improve Outcome: Progressing Goal: Progress toward achieving an optimal weight will improve Outcome: Progressing   Problem: Skin Integrity: Goal: Risk for impaired skin integrity will decrease Outcome: Progressing   Problem: Education: Goal: Knowledge of General Education information will improve Description: Including pain rating scale, medication(s)/side effects and non-pharmacologic comfort measures Outcome: Progressing   Problem: Health Behavior/Discharge Planning: Goal: Ability to manage health-related needs will improve Outcome: Progressing   Problem: Coping: Goal: Level of anxiety will decrease Outcome: Progressing   Problem: Nutrition: Goal: Adequate nutrition will be maintained Outcome: Progressing   Problem: Pain Managment: Goal: General experience of comfort will improve and/or be controlled Outcome: Progressing   Problem: Safety: Goal: Ability to remain free from injury will improve Outcome: Progressing

## 2024-02-02 ENCOUNTER — Telehealth: Payer: Self-pay | Admitting: Gastroenterology

## 2024-02-02 ENCOUNTER — Telehealth: Payer: Self-pay | Admitting: *Deleted

## 2024-02-02 DIAGNOSIS — K859 Acute pancreatitis without necrosis or infection, unspecified: Secondary | ICD-10-CM

## 2024-02-02 NOTE — Telephone Encounter (Signed)
 MRI scheduled for 4/21, arrival 9am, npo 4 hrs prior. Called pt, no answer and no VM   His F/u will need to be after this date.

## 2024-02-02 NOTE — Telephone Encounter (Signed)
-----   Message from Eula Listen sent at 01/31/2024 10:00 AM EST ----- Patient needs an MRI of his pancreas-recurrent idiopathic pancreatitis in about 8 weeks.  He should be coming back to see Verlon Au in about 8 weeks as well (make appointment just after MRIs been done) thanks

## 2024-02-02 NOTE — Telephone Encounter (Signed)
 Called patient to schedule the appt following the MRI appointment ... The patient had left a message earlier in the day and I tried to call him back but unable to leave a message.  Will try again.

## 2024-02-03 NOTE — Telephone Encounter (Signed)
 Patient wasn't home but I left a message asking him to call the office to schedule the appt after his MRI

## 2024-03-06 ENCOUNTER — Other Ambulatory Visit: Payer: Self-pay

## 2024-03-06 ENCOUNTER — Encounter (HOSPITAL_COMMUNITY): Payer: Self-pay | Admitting: Emergency Medicine

## 2024-03-06 ENCOUNTER — Emergency Department (HOSPITAL_COMMUNITY)
Admission: EM | Admit: 2024-03-06 | Discharge: 2024-03-06 | Disposition: A | Discharge status: Home / Self Care | Attending: Emergency Medicine | Admitting: Emergency Medicine

## 2024-03-06 DIAGNOSIS — H6122 Impacted cerumen, left ear: Secondary | ICD-10-CM | POA: Diagnosis not present

## 2024-03-06 DIAGNOSIS — Z7982 Long term (current) use of aspirin: Secondary | ICD-10-CM | POA: Insufficient documentation

## 2024-03-06 DIAGNOSIS — H9202 Otalgia, left ear: Secondary | ICD-10-CM | POA: Diagnosis present

## 2024-03-06 NOTE — ED Triage Notes (Signed)
 Pt c/o not being able to hear out of his left ear for the past 4 days. Pt states he has been trying to use peroxide fr the past 3 days with out improvement.

## 2024-03-06 NOTE — ED Provider Notes (Signed)
 [place] [facility] Provider Note   CSN: [id number] Arrival date & time: 03/06/24  1902     History  Chief Complaint  Patient presents with   Ear Fullness    Taylor Obrien is a 58 y.o. male.  That his left ear is clogged with wax.  Has not been adherent to the ear for 3 days.  Denies pain or drainage or fever or other complaints.  Taylor Obrien has had this happen in the past   Ear Fullness       Home Medications Prior to Admission medications   Medication Sig Start Date End Date Taking? Authorizing Provider  amLODipine (NORVASC) 10 MG tablet Take 1 tablet (10 mg total) by mouth daily. 01/19/16   Kirsteins, Victorino Sparrow, MD  aspirin 325 MG tablet Take 1 tablet (325 mg total) by mouth daily. 12/20/15   Love, Evlyn Kanner, PA-C  atorvastatin (LIPITOR) 80 MG tablet Take 80 mg by mouth at bedtime. 03/17/23   [provider]  cloNIDine (CATAPRES - DOSED IN MG/24 HR) 0.3 mg/24hr patch Place 0.3 mg onto the skin once a week. 11/19/23   [provider]  cloNIDine (CATAPRES - DOSED IN MG/24 HR) 0.3 mg/24hr patch Place 1 patch (0.3 mg total) onto the skin once a week. Next change on monday 01/31/24   Sherryll Burger, Pratik D, DO  diphenhydramine-acetaminophen (TYLENOL PM) 25-500 MG TABS tablet Take 1-2 tablets by mouth at bedtime as needed (for sleep). Depends on insomnia if takes 1-2 tablets    [provider]  empagliflozin (JARDIANCE) 25 MG TABS tablet Take 25 mg by mouth daily.     [provider]  escitalopram (LEXAPRO) 20 MG tablet Take 20 mg by mouth daily. 01/14/24   [provider]  fenofibrate (TRICOR) 145 MG tablet Take 145 mg by mouth daily. 04/10/22   [provider]  gabapentin (NEURONTIN) 300 MG capsule Take 300 mg by mouth every evening. 11/19/23   [provider]  hydrOXYzine (ATARAX) 25 MG tablet Take 25 mg by mouth every 8 (eight) hours as needed for anxiety. 04/17/22   [provider]  ibuprofen  (ADVIL) 600 MG tablet Take 600 mg by mouth every 6 (six) hours as needed for moderate pain (pain score 4-6).    [provider]  labetalol (NORMODYNE) 300 MG tablet Take 300 mg by mouth 2 (two) times daily. 03/08/22   [provider]  lisinopril (ZESTRIL) 20 MG tablet Take 20 mg by mouth daily. 09/08/23   [provider]  oxyCODONE (OXY IR/ROXICODONE) 5 MG immediate release tablet Take 5 mg by mouth every 8 (eight) hours as needed for moderate pain (pain score 4-6). 06/28/22   [provider]  pantoprazole (PROTONIX) 40 MG tablet Take 1 tablet (40 mg total) by mouth at bedtime. 01/19/16   Kirsteins, Victorino Sparrow, MD  pregabalin (LYRICA) 50 MG capsule Take 50 mg by mouth 2 (two) times daily. Patient not taking: Reported on 01/30/2024 05/14/23   [provider]  traZODone (DESYREL) 50 MG tablet Take 50 mg by mouth at bedtime. 01/06/24   [provider]  triamcinolone cream (KENALOG) 0.1 % Apply 1 application  topically daily as needed (Deratitis).    [provider]  VASCEPA 1 g capsule Take 2 g by mouth 2 (two) times daily. 03/12/22   [provider]      Allergies    Ibuprofen and Oxcarbazepine    Review of Systems   Review  of Systems  Physical Exam Updated Vital Signs BP (!) 166/94 (BP Location: Left Arm)   Pulse 86   Temp 97.9 F (36.6 C) (Oral)   Resp 17   Ht 5\' 5"  (1.651 m)   Wt 77 kg   SpO2 98%   BMI 28.25 kg/m  Physical Exam Vitals and nursing note reviewed.  Constitutional:      General: Taylor Obrien is not in acute distress.    Appearance: Taylor Obrien is well-developed.  HENT:     Head: Normocephalic and atraumatic.     Right Ear: There is impacted cerumen.     Left Ear: Hearing normal. There is no impacted cerumen.     Mouth/Throat:     Mouth: Mucous membranes are moist.  Eyes:     Extraocular Movements: Extraocular movements intact.     Conjunctiva/sclera: Conjunctivae normal.     Pupils: Pupils are equal, round, and  reactive to light.  Cardiovascular:     Rate and Rhythm: Normal rate and regular rhythm.     Heart sounds: No murmur heard. Pulmonary:     Effort: Pulmonary effort is normal. No respiratory distress.     Breath sounds: Normal breath sounds.  Musculoskeletal:        General: No swelling.     Cervical back: Neck supple.  Skin:    General: Skin is warm and dry.     Capillary Refill: Capillary refill takes less than 2 seconds.  Neurological:     Mental Status: Taylor Obrien is alert.  Psychiatric:        Mood and Affect: Mood normal.        Behavior: Behavior normal.     ED Results / Procedures / Treatments   Labs (all labs ordered are listed, but only abnormal results are displayed) Labs Reviewed - No data to display  EKG None  Radiology No results found.  Procedures Ear Cerumen Removal  Date/Time: 03/06/2024 7:57 PM  Performed by: Ma Rings, PA-C Authorized by: Ma Rings, PA-C   Consent:    Consent obtained:  Verbal   Consent given by:  Patient   Risks discussed:  Bleeding, infection, pain, dizziness, incomplete removal and TM perforation   Alternatives discussed:  Referral Universal protocol:    Procedure explained and questions answered to patient or proxy's satisfaction: yes     Patient identity confirmed:  Verbally with patient Procedure details:    Location:  L ear   Procedure type: irrigation     Procedure outcomes: cerumen removed   Post-procedure details:    Inspection:  TM intact and no bleeding   Hearing quality:  Normal   Procedure completion:  Tolerated well, no immediate complications     Medications Ordered in ED Medications - No data to display  ED Course/ Medical Decision Making/ A&P                                 Medical Decision Making Differential diagnosis includes but not limited to cerumen impaction, otitis media, otitis externa, other  Course: Patient here for decreased hearing left side with symptoms similar to prior  episodes when Taylor Obrien had cerumen impaction.  His ear was irrigated with warm water with complete resolution and no difficulty.  Taylor Obrien feels much better advised on PCP and/or ENT follow-up as needed and strict return precautions.           Final Clinical Impression(s) / ED Diagnoses  Final diagnoses:  Impacted cerumen of left ear    Rx / DC Orders ED Discharge Orders     None         Josem Kaufmann 03/06/24 Andres Shad, MD 03/07/24 1123

## 2024-03-06 NOTE — Discharge Instructions (Addendum)
 You were evaluated in the ER today for impacted earwax, which was removed.  Follow-up with your PCP.  Do not use Q-tips which can worsen earwax impaction.  You can follow-up with ENT as needed and/or your primary care doctor.  Come back to the ER if you have very bad ear pain, fever, drainage from your ear or other worrisome changes.

## 2024-03-29 ENCOUNTER — Ambulatory Visit: Payer: Medicare Other | Admitting: Gastroenterology

## 2024-03-29 ENCOUNTER — Encounter (HOSPITAL_COMMUNITY): Payer: Self-pay

## 2024-03-29 ENCOUNTER — Ambulatory Visit (HOSPITAL_COMMUNITY): Payer: Medicare Other | Attending: Internal Medicine

## 2024-04-05 ENCOUNTER — Ambulatory Visit (INDEPENDENT_AMBULATORY_CARE_PROVIDER_SITE_OTHER): Admitting: Gastroenterology

## 2024-04-05 ENCOUNTER — Encounter: Payer: Self-pay | Admitting: Gastroenterology

## 2024-04-05 ENCOUNTER — Telehealth: Payer: Self-pay

## 2024-04-05 ENCOUNTER — Encounter: Payer: Self-pay | Admitting: *Deleted

## 2024-04-05 VITALS — BP 210/135 | HR 98 | Temp 99.3°F | Ht 66.0 in

## 2024-04-05 DIAGNOSIS — F1091 Alcohol use, unspecified, in remission: Secondary | ICD-10-CM

## 2024-04-05 DIAGNOSIS — K8689 Other specified diseases of pancreas: Secondary | ICD-10-CM | POA: Diagnosis not present

## 2024-04-05 DIAGNOSIS — F17210 Nicotine dependence, cigarettes, uncomplicated: Secondary | ICD-10-CM | POA: Diagnosis not present

## 2024-04-05 DIAGNOSIS — I160 Hypertensive urgency: Secondary | ICD-10-CM

## 2024-04-05 NOTE — Addendum Note (Signed)
 Addended by: Lanney Pitts on: 04/05/2024 01:32 PM   Modules accepted: Orders

## 2024-04-05 NOTE — Telephone Encounter (Signed)
 Looked at med list.  Vascepa  last filled 12/2023 Neurontin  last filled 11/2023 Ibuprofen  last filled 12/2023 Atorvastatin , hydroxyzine , labetolol, lisinopril  not on the [facility] medication list going back to 11/2023

## 2024-04-05 NOTE — Telephone Encounter (Signed)
 Contacted pt's PCP [facility] and informed them of today's visit. Spoke with someone at the front desk and they stated that they were going to send a note back to the pt's PCP notifying them and will reach out to the pt to get him scheduled a f/u appt with them. Pt's medication list was received from [facility] and is in your box for review.

## 2024-04-05 NOTE — Patient Instructions (Addendum)
 We will contact the pharmacy to get updated medication list. THE MEDICATION ON TODAY'S PRINTOUT MAY NOT BE ACCURATE. I recommend you go to the ER today due to your extremely elevated blood pressure. Since you are not willing to do this, if you have change in symptoms, headache, dizziness, numbness/tingling, please call 911 and go to the ER.  Please call your aid and request them come out and get you started back on your medications.  We will contact your PCP and let them know about today's visit concerns. We will schedule you for MRI of your pancreas.

## 2024-04-05 NOTE — Progress Notes (Signed)
 GI Office Note    Referring Provider: Sena Dam, NP Primary Care Physician:  Sena Dam, NP  Primary Gastroenterologist: Rolando Cliche. Mordechai April, DO   Chief Complaint   Chief Complaint  Patient presents with   Follow-up    History of Present Illness   Taylor Obrien is a 58 y.o. male presenting today for follow up. Hospialized in 01/2024 with pancreatitis. Similar episode two years prior while actively drinking but reported no etoh in 2 years.   CT abdomen pelvis with contrast suggestive of acute pancreatitis without complication.  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  No obvious mass.  Recommended follow-up MRI abdomen with and without contrast in 3 to 4 weeks.  Noted to have contracted gallbladder with cholelithiasis. Lipase in the 3400 range. LFTs normal.  Today: Patient states he has not taken any of his medications in over 5 days.  He is in between eights.  States his daughter used to be his hired Engineer, production but she does not want to take care of him anymore.  Patient currently resides with his daughter and ex-wife but plans to get his own place, moving out Saturday.  He states he has significant memory issues since his stroke in 2016.  He is not able to see well.  He states he is not able to dispense his own medications or change out his clonidine  patch.  He cannot see well enough to make sure he takes his medications appropriately.  He does drive himself.  He uses wheelchair.  He presented to the office alone today.  States his new aide was supposed to come to the house this morning but she did not.  He plans to call when he gets back home.  States he has not seen his PCP since his hospitalization in February.  BM every other day. Drinks prune juice every day. Does not look at stool. No abdominal pain. Appetite ok. Very limited food prep. House full of food and no one cooks it for him.  He will make himself  sandwiches.  Chronic dizziness due to his vision issues patient.  No headaches.  Chest pain or shortness of breath.  No etoh in over two years.   Colonoscopy 2023: - Diverticulosis -two 4 to 5 mm polyps in the descending colon and transverse colon tubular adenomas - Colonoscopy in 5 years  EGD September 2023: -Small hiatal hernia -Gastritis status post biopsy, no H. pylori  Medications   Current Outpatient Medications  Medication Sig Dispense Refill   amLODipine  (NORVASC ) 10 MG tablet Take 1 tablet (10 mg total) by mouth daily. 30 tablet 0   aspirin  325 MG tablet Take 1 tablet (325 mg total) by mouth daily. 100 tablet 0   atorvastatin  (LIPITOR) 80 MG tablet Take 80 mg by mouth at bedtime.     cloNIDine  (CATAPRES  - DOSED IN MG/24 HR) 0.3 mg/24hr patch Place 0.3 mg onto the skin once a week.     cloNIDine  (CATAPRES  - DOSED IN MG/24 HR) 0.3 mg/24hr patch Place 1 patch (0.3 mg total) onto the skin once a week. Next change on monday 4 patch 12   diphenhydramine -acetaminophen  (TYLENOL  PM) 25-500 MG TABS tablet Take 1-2 tablets by mouth at bedtime as needed (for sleep). Depends on insomnia if takes 1-2 tablets     empagliflozin (JARDIANCE) 25 MG TABS tablet Take 25 mg by mouth daily.      escitalopram  (LEXAPRO ) 20 MG  tablet Take 20 mg by mouth daily.     fenofibrate  (TRICOR ) 145 MG tablet Take 145 mg by mouth daily.     gabapentin  (NEURONTIN ) 300 MG capsule Take 300 mg by mouth every evening.     hydrOXYzine  (ATARAX ) 25 MG tablet Take 25 mg by mouth every 8 (eight) hours as needed for anxiety.     ibuprofen  (ADVIL ) 600 MG tablet Take 600 mg by mouth every 6 (six) hours as needed for moderate pain (pain score 4-6).     labetalol  (NORMODYNE ) 300 MG tablet Take 300 mg by mouth 2 (two) times daily.     lisinopril  (ZESTRIL ) 20 MG tablet Take 20 mg by mouth daily.     oxyCODONE  (OXY IR/ROXICODONE ) 5 MG immediate release tablet Take 5 mg by mouth every 8 (eight) hours as needed for moderate pain  (pain score 4-6).     pantoprazole  (PROTONIX ) 40 MG tablet Take 1 tablet (40 mg total) by mouth at bedtime. 30 tablet 0   pregabalin (LYRICA) 50 MG capsule Take 50 mg by mouth 2 (two) times daily. (Patient not taking: Reported on 01/30/2024)     traZODone  (DESYREL ) 50 MG tablet Take 50 mg by mouth at bedtime.     triamcinolone cream (KENALOG) 0.1 % Apply 1 application  topically daily as needed (Deratitis).     VASCEPA  1 g capsule Take 2 g by mouth 2 (two) times daily.     No current facility-administered medications for this visit.    Allergies   Allergies as of 04/05/2024 - Review Complete 04/05/2024  Allergen Reaction Noted   Ibuprofen  Other (See Comments) 06/18/2023   Oxcarbazepine Other (See Comments) 08/02/2011        Review of Systems   General: Negative for anorexia, weight loss, fever, chills, fatigue, weakness. Uses wheelchair ENT: Negative for hoarseness, difficulty swallowing , nasal congestion. CV: Negative for chest pain, angina, palpitations, dyspnea on exertion, peripheral edema.  Respiratory: Negative for dyspnea at rest, dyspnea on exertion, cough, sputum, wheezing.  GI: See history of present illness. GU:  Negative for dysuria, hematuria, urinary incontinence, urinary frequency, nocturnal urination.  Endo: Negative for unusual weight change.     Physical Exam   BP (!) 210/135 (BP Location: Right Arm, Patient Position: Sitting, Cuff Size: Normal)   Pulse 98   Temp 99.3 F (37.4 C) (Oral)   Ht 5\' 6"  (1.676 m)   SpO2 99%   BMI 27.40 kg/m    General: Well-nourished, well-developed in no acute distress.  Eyes: No icterus. Mouth: Oropharyngeal mucosa moist and pink   Lungs: Clear to auscultation bilaterally.  Heart: Regular rate and rhythm, no murmurs rubs or gallops.  Abdomen: Bowel sounds are normal, nontender, nondistended, no hepatosplenomegaly or masses,  no abdominal bruits or hernia , no rebound or guarding. Limited in wheelchair Rectal: not  performed  Extremities: No lower extremity edema. No clubbing or deformities. Neuro: Alert and oriented x 4   Skin: Warm and dry, no jaundice.   Psych: Alert and cooperative, normal mood and affect.  Labs   Lab Results  Component Value Date   NA 137 01/31/2024   CL 107 01/31/2024   K 3.9 01/31/2024   CO2 24 01/31/2024   BUN 17 01/31/2024   CREATININE 1.94 (H) 01/31/2024   GFRNONAA 40 (L) 01/31/2024   CALCIUM  8.8 (L) 01/31/2024   PHOS 3.0 01/30/2024   ALBUMIN 3.4 (L) 01/31/2024   GLUCOSE 99 01/31/2024   Lab Results  Component Value Date  ALT 10 01/31/2024   AST 22 01/31/2024   ALKPHOS 33 (L) 01/31/2024   BILITOT 0.6 01/31/2024   Lab Results  Component Value Date   WBC 5.9 01/31/2024   HGB 12.3 (L) 01/31/2024   HCT 37.4 (L) 01/31/2024   MCV 95.4 01/31/2024   PLT 228 01/31/2024    Imaging Studies   No results found.  Assessment/Plan:   Hypertensive urgency: -patient has not been taking his medications for nearly a week. He states he is unable to see well enough to know what to take and no one is willing to help him in his currently living situation (lives with ex-wife and daughter who per patient used to be his hired Engineer, production but no longer wants the job). He reports new aide did not show up this morning.  -we will update PCP -patient refused to go to ER. Recommend if any new symptoms, go to the ER. He should call 911. -call pharmacy for updated med list  Dilated pancreatic duct/recent pancreatitis: remote etoh use but none in two year -currently asymptomatic -needs pancreatic protocol imaging. Due to his elevated creatinine he is not likely a candidate for MRI. Will switch to pancreatic protocol CT pancreas -update BMET, CBC, iron/tibc/ferritin/b12/folate (latter due to mild anemia).     Trudie Fuse. Harles Lied, MHS, PA-C [facility]

## 2024-04-19 ENCOUNTER — Encounter (HOSPITAL_COMMUNITY): Payer: Self-pay

## 2024-04-19 ENCOUNTER — Inpatient Hospital Stay (HOSPITAL_COMMUNITY)
Admission: EM | Admit: 2024-04-19 | Discharge: 2024-04-21 | DRG: 918 | Disposition: A | Discharge status: Other Acute Inpatient Hospital | Attending: Internal Medicine | Admitting: Internal Medicine

## 2024-04-19 ENCOUNTER — Other Ambulatory Visit: Payer: Self-pay

## 2024-04-19 DIAGNOSIS — N1831 Chronic kidney disease, stage 3a: Secondary | ICD-10-CM | POA: Diagnosis not present

## 2024-04-19 DIAGNOSIS — G4733 Obstructive sleep apnea (adult) (pediatric): Secondary | ICD-10-CM | POA: Diagnosis present

## 2024-04-19 DIAGNOSIS — T450X2A Poisoning by antiallergic and antiemetic drugs, intentional self-harm, initial encounter: Principal | ICD-10-CM | POA: Diagnosis present

## 2024-04-19 DIAGNOSIS — I129 Hypertensive chronic kidney disease with stage 1 through stage 4 chronic kidney disease, or unspecified chronic kidney disease: Secondary | ICD-10-CM | POA: Diagnosis present

## 2024-04-19 DIAGNOSIS — I160 Hypertensive urgency: Secondary | ICD-10-CM | POA: Diagnosis present

## 2024-04-19 DIAGNOSIS — Z8249 Family history of ischemic heart disease and other diseases of the circulatory system: Secondary | ICD-10-CM

## 2024-04-19 DIAGNOSIS — Z7984 Long term (current) use of oral hypoglycemic drugs: Secondary | ICD-10-CM

## 2024-04-19 DIAGNOSIS — E1122 Type 2 diabetes mellitus with diabetic chronic kidney disease: Secondary | ICD-10-CM | POA: Diagnosis not present

## 2024-04-19 DIAGNOSIS — Z6832 Body mass index (BMI) 32.0-32.9, adult: Secondary | ICD-10-CM

## 2024-04-19 DIAGNOSIS — Z888 Allergy status to other drugs, medicaments and biological substances status: Secondary | ICD-10-CM

## 2024-04-19 DIAGNOSIS — R000 Tachycardia, unspecified: Secondary | ICD-10-CM | POA: Diagnosis present

## 2024-04-19 DIAGNOSIS — Z79899 Other long term (current) drug therapy: Secondary | ICD-10-CM

## 2024-04-19 DIAGNOSIS — E669 Obesity, unspecified: Secondary | ICD-10-CM | POA: Diagnosis present

## 2024-04-19 DIAGNOSIS — F32A Depression, unspecified: Secondary | ICD-10-CM | POA: Diagnosis present

## 2024-04-19 DIAGNOSIS — Z1152 Encounter for screening for COVID-19: Secondary | ICD-10-CM

## 2024-04-19 DIAGNOSIS — Z87891 Personal history of nicotine dependence: Secondary | ICD-10-CM

## 2024-04-19 DIAGNOSIS — K219 Gastro-esophageal reflux disease without esophagitis: Secondary | ICD-10-CM | POA: Diagnosis present

## 2024-04-19 DIAGNOSIS — F1210 Cannabis abuse, uncomplicated: Secondary | ICD-10-CM | POA: Diagnosis present

## 2024-04-19 DIAGNOSIS — Z886 Allergy status to analgesic agent status: Secondary | ICD-10-CM

## 2024-04-19 DIAGNOSIS — R45851 Suicidal ideations: Secondary | ICD-10-CM

## 2024-04-19 DIAGNOSIS — Z813 Family history of other psychoactive substance abuse and dependence: Secondary | ICD-10-CM

## 2024-04-19 DIAGNOSIS — Z91148 Patient's other noncompliance with medication regimen for other reason: Secondary | ICD-10-CM

## 2024-04-19 DIAGNOSIS — Z8673 Personal history of transient ischemic attack (TIA), and cerebral infarction without residual deficits: Secondary | ICD-10-CM

## 2024-04-19 DIAGNOSIS — Z993 Dependence on wheelchair: Secondary | ICD-10-CM

## 2024-04-19 DIAGNOSIS — N1832 Chronic kidney disease, stage 3b: Secondary | ICD-10-CM | POA: Diagnosis present

## 2024-04-19 DIAGNOSIS — R509 Fever, unspecified: Secondary | ICD-10-CM | POA: Diagnosis present

## 2024-04-19 DIAGNOSIS — E785 Hyperlipidemia, unspecified: Secondary | ICD-10-CM | POA: Diagnosis present

## 2024-04-19 DIAGNOSIS — Z833 Family history of diabetes mellitus: Secondary | ICD-10-CM

## 2024-04-19 DIAGNOSIS — Z7982 Long term (current) use of aspirin: Secondary | ICD-10-CM

## 2024-04-19 DIAGNOSIS — E119 Type 2 diabetes mellitus without complications: Secondary | ICD-10-CM

## 2024-04-19 DIAGNOSIS — Z83438 Family history of other disorder of lipoprotein metabolism and other lipidemia: Secondary | ICD-10-CM

## 2024-04-19 LAB — CBG MONITORING, ED
Glucose-Capillary: 145 mg/dL — ABNORMAL HIGH (ref 70–99)
Glucose-Capillary: 157 mg/dL — ABNORMAL HIGH (ref 70–99)

## 2024-04-19 LAB — COMPREHENSIVE METABOLIC PANEL WITH GFR
ALT: 15 U/L (ref 0–44)
AST: 26 U/L (ref 15–41)
Albumin: 3.3 g/dL — ABNORMAL LOW (ref 3.5–5.0)
Alkaline Phosphatase: 72 U/L (ref 38–126)
Anion gap: 11 (ref 5–15)
BUN: 14 mg/dL (ref 6–20)
CO2: 21 mmol/L — ABNORMAL LOW (ref 22–32)
Calcium: 9.2 mg/dL (ref 8.9–10.3)
Chloride: 105 mmol/L (ref 98–111)
Creatinine, Ser: 1.73 mg/dL — ABNORMAL HIGH (ref 0.61–1.24)
GFR, Estimated: 46 mL/min — ABNORMAL LOW (ref >60)
Glucose, Bld: 127 mg/dL — ABNORMAL HIGH (ref 70–99)
Potassium: 4.2 mmol/L (ref 3.5–5.1)
Sodium: 137 mmol/L (ref 135–145)
Total Bilirubin: 0.5 mg/dL (ref 0.0–1.2)
Total Protein: 7.2 g/dL (ref 6.5–8.1)

## 2024-04-19 LAB — ACETAMINOPHEN LEVEL
Acetaminophen (Tylenol), Serum: <10 ug/mL — ABNORMAL LOW (ref 10–30)
Acetaminophen (Tylenol), Serum: <10 ug/mL — ABNORMAL LOW (ref 10–30)

## 2024-04-19 LAB — CBC WITH DIFFERENTIAL/PLATELET
Abs Immature Granulocytes: 0.03 10*3/uL (ref 0.00–0.07)
Basophils Absolute: 0 10*3/uL (ref 0.0–0.1)
Basophils Relative: 0 %
Eosinophils Absolute: 0 10*3/uL (ref 0.0–0.5)
Eosinophils Relative: 0 %
HCT: 43.2 % (ref 39.0–52.0)
Hemoglobin: 14.5 g/dL (ref 13.0–17.0)
Immature Granulocytes: 0 %
Lymphocytes Relative: 16 %
Lymphs Abs: 1.3 10*3/uL (ref 0.7–4.0)
MCH: 31.6 pg (ref 26.0–34.0)
MCHC: 33.6 g/dL (ref 30.0–36.0)
MCV: 94.1 fL (ref 80.0–100.0)
Monocytes Absolute: 0.4 10*3/uL (ref 0.1–1.0)
Monocytes Relative: 5 %
Neutro Abs: 6.6 10*3/uL (ref 1.7–7.7)
Neutrophils Relative %: 79 %
Platelets: 252 10*3/uL (ref 150–400)
RBC: 4.59 MIL/uL (ref 4.22–5.81)
RDW: 15.5 % (ref 11.5–15.5)
WBC: 8.4 10*3/uL (ref 4.0–10.5)
nRBC: 0 % (ref 0.0–0.2)

## 2024-04-19 LAB — RAPID URINE DRUG SCREEN, HOSP PERFORMED
Amphetamines: NOT DETECTED
Barbiturates: NOT DETECTED
Benzodiazepines: NOT DETECTED
Cocaine: NOT DETECTED
Opiates: NOT DETECTED
Tetrahydrocannabinol: POSITIVE — AB

## 2024-04-19 LAB — URINALYSIS, ROUTINE W REFLEX MICROSCOPIC
Bacteria, UA: NONE SEEN
Bilirubin Urine: NEGATIVE
Glucose, UA: 50 mg/dL — AB
Ketones, ur: NEGATIVE mg/dL
Leukocytes,Ua: NEGATIVE
Nitrite: NEGATIVE
Protein, ur: >=300 mg/dL — AB
Specific Gravity, Urine: 1.005 (ref 1.005–1.030)
pH: 6 (ref 5.0–8.0)

## 2024-04-19 LAB — CK: Total CK: 270 U/L (ref 49–397)

## 2024-04-19 LAB — GLUCOSE, CAPILLARY: Glucose-Capillary: 138 mg/dL — ABNORMAL HIGH (ref 70–99)

## 2024-04-19 LAB — SALICYLATE LEVEL: Salicylate Lvl: <7 mg/dL — ABNORMAL LOW (ref 7.0–30.0)

## 2024-04-19 LAB — MAGNESIUM: Magnesium: 1.9 mg/dL (ref 1.7–2.4)

## 2024-04-19 LAB — ETHANOL: Alcohol, Ethyl (B): <15 mg/dL (ref <15)

## 2024-04-19 MED ORDER — AMLODIPINE BESYLATE 5 MG PO TABS: 10.0000 mg | ORAL_TABLET | Freq: Every day | ORAL | Status: DC

## 2024-04-19 MED ORDER — LABETALOL HCL 5 MG/ML IV SOLN
10.0000 mg | Freq: Once | INTRAVENOUS | Status: AC
  Administered 2024-04-19: 10 mg via INTRAVENOUS
  Filled 2024-04-19: qty 4

## 2024-04-19 MED ORDER — GABAPENTIN 300 MG PO CAPS
300.0000 mg | ORAL_CAPSULE | Freq: Every day | ORAL | Status: DC
  Administered 2024-04-20: 300 mg via ORAL
  Filled 2024-04-19 (×2): qty 1

## 2024-04-19 MED ORDER — LISINOPRIL 10 MG PO TABS
20.0000 mg | ORAL_TABLET | Freq: Every day | ORAL | Status: DC
  Administered 2024-04-20: 20 mg via ORAL
  Filled 2024-04-19: qty 2

## 2024-04-19 MED ORDER — CHARCOAL ACTIVATED PO LIQD
50.0000 g | Freq: Once | ORAL | Status: AC
  Administered 2024-04-19: 50 g via ORAL
  Filled 2024-04-19: qty 240

## 2024-04-19 MED ORDER — PANTOPRAZOLE SODIUM 40 MG PO TBEC
40.0000 mg | DELAYED_RELEASE_TABLET | Freq: Every day | ORAL | Status: DC
  Administered 2024-04-19 – 2024-04-20 (×2): 40 mg via ORAL
  Filled 2024-04-19 (×2): qty 1

## 2024-04-19 MED ORDER — LABETALOL HCL 200 MG PO TABS: 300.0000 mg | ORAL_TABLET | Freq: Two times a day (BID) | ORAL | Status: DC

## 2024-04-19 MED ORDER — MELATONIN 3 MG PO TABS
3.0000 mg | ORAL_TABLET | Freq: Every day | ORAL | Status: DC
  Administered 2024-04-19 – 2024-04-20 (×2): 3 mg via ORAL
  Filled 2024-04-19 (×2): qty 1

## 2024-04-19 MED ORDER — ONDANSETRON HCL 4 MG PO TABS: 4.0000 mg | ORAL_TABLET | Freq: Three times a day (TID) | ORAL | Status: DC | PRN

## 2024-04-19 MED ORDER — LABETALOL HCL 5 MG/ML IV SOLN: 10.0000 mg | INTRAVENOUS | Status: DC | PRN

## 2024-04-19 MED ORDER — LABETALOL HCL 5 MG/ML IV SOLN
5.0000 mg | Freq: Once | INTRAVENOUS | Status: AC
  Administered 2024-04-19: 5 mg via INTRAVENOUS
  Filled 2024-04-19: qty 4

## 2024-04-19 MED ORDER — ASPIRIN 325 MG PO TABS
325.0000 mg | ORAL_TABLET | Freq: Every day | ORAL | Status: DC
  Administered 2024-04-19 – 2024-04-21 (×3): 325 mg via ORAL
  Filled 2024-04-19 (×3): qty 1

## 2024-04-19 MED ORDER — SODIUM CHLORIDE 0.9 % IV SOLN: INTRAVENOUS | Status: DC

## 2024-04-19 MED ORDER — EMPAGLIFLOZIN 25 MG PO TABS
25.0000 mg | ORAL_TABLET | Freq: Every day | ORAL | Status: DC
  Filled 2024-04-19: qty 1

## 2024-04-19 MED ORDER — AMLODIPINE BESYLATE 5 MG PO TABS
10.0000 mg | ORAL_TABLET | Freq: Every day | ORAL | Status: DC
  Administered 2024-04-20 – 2024-04-21 (×3): 10 mg via ORAL
  Filled 2024-04-19 (×3): qty 2

## 2024-04-19 MED ORDER — LORAZEPAM 2 MG/ML IJ SOLN: 2.0000 mg | Freq: Once | INTRAMUSCULAR | Status: DC

## 2024-04-19 MED ORDER — ATORVASTATIN CALCIUM 40 MG PO TABS
80.0000 mg | ORAL_TABLET | Freq: Every day | ORAL | Status: DC
  Administered 2024-04-19 – 2024-04-20 (×2): 80 mg via ORAL
  Filled 2024-04-19 (×2): qty 2

## 2024-04-19 MED ORDER — ONDANSETRON HCL 4 MG/2ML IJ SOLN
4.0000 mg | Freq: Once | INTRAMUSCULAR | Status: AC
  Administered 2024-04-19: 4 mg via INTRAVENOUS
  Filled 2024-04-19: qty 2

## 2024-04-19 MED ORDER — ESCITALOPRAM OXALATE 10 MG PO TABS
20.0000 mg | ORAL_TABLET | Freq: Every day | ORAL | Status: DC
Start: 2024-04-19 — End: 2024-04-20
  Administered 2024-04-19: 20 mg via ORAL
  Filled 2024-04-19: qty 2

## 2024-04-19 MED ORDER — ICOSAPENT ETHYL 1 G PO CAPS
2.0000 g | ORAL_CAPSULE | Freq: Two times a day (BID) | ORAL | Status: DC
  Filled 2024-04-19: qty 2

## 2024-04-19 MED ORDER — CLONIDINE HCL 0.3 MG/24HR TD PTWK: 0.3000 mg | MEDICATED_PATCH | TRANSDERMAL | Status: DC

## 2024-04-19 MED ORDER — LORAZEPAM 2 MG/ML IJ SOLN
1.0000 mg | Freq: Once | INTRAMUSCULAR | Status: AC
  Administered 2024-04-19: 1 mg via INTRAVENOUS
  Filled 2024-04-19: qty 1

## 2024-04-19 MED ORDER — ACETAMINOPHEN 325 MG PO TABS
650.0000 mg | ORAL_TABLET | ORAL | Status: DC | PRN
  Administered 2024-04-19: 650 mg via ORAL
  Filled 2024-04-19: qty 2

## 2024-04-19 MED ORDER — LABETALOL HCL 200 MG PO TABS
300.0000 mg | ORAL_TABLET | Freq: Two times a day (BID) | ORAL | Status: DC
  Administered 2024-04-19 – 2024-04-21 (×4): 300 mg via ORAL
  Filled 2024-04-19 (×4): qty 2

## 2024-04-19 NOTE — ED Provider Notes (Signed)
 [place] [facility] DE[facility]MENT AT [facility] Provider Note   CSN: [id number] Arrival date & time: 04/19/24  1510     History  Chief Complaint  Patient presents with   Drug Overdose    Benadryl      Taylor Obrien is a 58 y.o. male with PMH as listed below who presents with diphenhydramine  overdose. Ingested unknown amount at 1430 pm today in a self harm attempt. States his wife is leaving him and he was trying to die. He thinks he took approximately one half a box. Also reports he stopped taking BP meds two weeks ago. Denies other co-ingestion.    Past Medical History:  Diagnosis Date   Acute alcoholic pancreatitis    Arthritis    Chronic pain    Depression    Diabetes mellitus without complication (HCC)    Essential hypertension, benign    GERD (gastroesophageal reflux disease)    Headache    HTN (hypertension) 11/27/2015   Hyperlipidemia    Sleep apnea    could not tolerate CPAP   Stroke [facility])    2016       Home Medications Prior to Admission medications   Medication Sig Start Date End Date Taking? Authorizing Provider  amLODipine  (NORVASC ) 10 MG tablet Take 1 tablet (10 mg total) by mouth daily. 01/19/16   Kirsteins, Cecilia Coe, MD  aspirin  325 MG tablet Take 1 tablet (325 mg total) by mouth daily. 12/20/15   Love, Renay Carota, PA-C  atorvastatin  (LIPITOR) 80 MG tablet Take 80 mg by mouth at bedtime. 03/17/23   [provider]  cloNIDine  (CATAPRES  - DOSED IN MG/24 HR) 0.3 mg/24hr patch Place 1 patch (0.3 mg total) onto the skin once a week. Next change on monday 01/31/24   Shah, Pratik D, DO  diphenhydramine -acetaminophen  (TYLENOL  PM) 25-500 MG TABS tablet Take 1-2 tablets by mouth at bedtime as needed (for sleep). Depends on insomnia if takes 1-2 tablets    [provider]  empagliflozin (JARDIANCE) 25 MG TABS tablet Take 25 mg by mouth daily.     [provider]  escitalopram  (LEXAPRO ) 20 MG tablet Take 20 mg by mouth daily. 01/14/24    [provider]  fenofibrate  (TRICOR ) 145 MG tablet Take 145 mg by mouth daily. 04/10/22   [provider]  gabapentin  (NEURONTIN ) 300 MG capsule Take 300 mg by mouth every evening. 11/19/23   [provider]  hydrOXYzine  (ATARAX ) 25 MG tablet Take 25 mg by mouth every 8 (eight) hours as needed for anxiety. 04/17/22   [provider]  ibuprofen  (ADVIL ) 600 MG tablet Take 600 mg by mouth every 6 (six) hours as needed for moderate pain (pain score 4-6).    [provider]  labetalol  (NORMODYNE ) 300 MG tablet Take 300 mg by mouth 2 (two) times daily. 03/08/22   [provider]  lisinopril  (ZESTRIL ) 20 MG tablet Take 20 mg by mouth daily. 09/08/23   [provider]  oxyCODONE  (OXY IR/ROXICODONE ) 5 MG immediate release tablet Take 5 mg by mouth every 8 (eight) hours as needed for moderate pain (pain score 4-6). 06/28/22   [provider]  pantoprazole  (PROTONIX ) 40 MG tablet Take 1 tablet (40 mg total) by mouth at bedtime. 01/19/16   Kirsteins, Cecilia Coe, MD  traZODone  (DESYREL ) 50 MG tablet Take 50 mg by mouth at bedtime. 01/06/24   [provider]  VASCEPA  1 g capsule Take 2 g by mouth 2 (two) times daily. 03/12/22  [provider]      Allergies    Ibuprofen  and Oxcarbazepine    Review of Systems   Review of Systems A 10 point review of systems was performed and is negative unless otherwise reported in HPI.  Physical Exam Updated Vital Signs BP (!) 158/108   Pulse 80   Temp 99.5 F (37.5 C) (Oral)   Resp 19   Ht 5\' 5"  (1.651 m)   Wt 87.2 kg   SpO2 97%   BMI 32.00 kg/m  Physical Exam General: Normal appearing obese male, lying in bed.  HEENT: NCAT, PERRLA, midrange, Sclera anicteric, MMM, trachea midline.  Cardiology: Regular tachycardic rate, no murmurs/rubs/gallops.  Resp: Normal respiratory rate and effort. CTAB, no wheezes, rhonchi, crackles.  Abd: Soft, non-tender, non-distended. No rebound  tenderness or guarding.  GU: Deferred. MSK: No peripheral edema or signs of trauma. Extremities without deformity or TTP. No cyanosis or clubbing. Skin: warm, dry. Neuro: A&Ox4, CNs II-XII grossly intact. MAEs. Sensation grossly intact.  Psych: Normal mood and affect.   ED Results / Procedures / Treatments   Labs (all labs ordered are listed, but only abnormal results are displayed) Labs Reviewed  COMPREHENSIVE METABOLIC PANEL WITH GFR - Abnormal; Notable for the following components:      Result Value   CO2 21 (*)    Glucose, Bld 127 (*)    Creatinine, Ser 1.73 (*)    Albumin 3.3 (*)    GFR, Estimated 46 (*)    All other components within normal limits  SALICYLATE LEVEL - Abnormal; Notable for the following components:   Salicylate Lvl <7.0 (*)    All other components within normal limits  ACETAMINOPHEN  LEVEL - Abnormal; Notable for the following components:   Acetaminophen  (Tylenol ), Serum <10 (*)    All other components within normal limits  URINALYSIS, ROUTINE W REFLEX MICROSCOPIC - Abnormal; Notable for the following components:   Color, Urine STRAW (*)    Glucose, UA 50 (*)    Hgb urine dipstick SMALL (*)    Protein, ur >=300 (*)    All other components within normal limits  RAPID URINE DRUG SCREEN, HOSP PERFORMED - Abnormal; Notable for the following components:   Tetrahydrocannabinol POSITIVE (*)    All other components within normal limits  ACETAMINOPHEN  LEVEL - Abnormal; Notable for the following components:   Acetaminophen  (Tylenol ), Serum <10 (*)    All other components within normal limits  CBG MONITORING, ED - Abnormal; Notable for the following components:   Glucose-Capillary 145 (*)    All other components within normal limits  CBG MONITORING, ED - Abnormal; Notable for the following components:   Glucose-Capillary 157 (*)    All other components within normal limits  CBC WITH DIFFERENTIAL/PLATELET  MAGNESIUM  CK  ETHANOL  CBG MONITORING, ED     EKG EKG Interpretation Date/Time:  Monday Apr 19 2024 18:54:38 EDT Ventricular Rate:  84 PR Interval:  180 QRS Duration:  108 QT Interval:  388 QTC Calculation: 459 R Axis:   -23  Text Interpretation: Sinus rhythm Ventricular premature complex Biatrial enlargement Abnormal R-wave progression, early transition LVH with secondary repolarization abnormality Inferior infarct, old Confirmed by Annita Kindle [phone]) on 04/19/2024 7:12:09 PM  Radiology No results found.  Procedures .Critical Care  Performed by: Merdis Stalling, MD Authorized by: Merdis Stalling, MD   Critical care provider statement:    Critical care time (minutes):  60   Critical care was necessary to treat or prevent  imminent or life-threatening deterioration of the following conditions:  Toxidrome   Critical care was time spent personally by me on the following activities:  Development of treatment plan with patient or surrogate, discussions with consultants, evaluation of patient's response to treatment, examination of patient, ordering and review of laboratory studies, ordering and review of radiographic studies, ordering and performing treatments and interventions, pulse oximetry, re-evaluation of patient's condition, review of old charts and obtaining history from patient or surrogate   Care discussed with: admitting provider       Medications Ordered in ED Medications  amLODipine  (NORVASC ) tablet 10 mg (has no administration in time range)  pantoprazole  (PROTONIX ) EC tablet 40 mg (has no administration in time range)  atorvastatin  (LIPITOR) tablet 80 mg (has no administration in time range)  melatonin tablet 3 mg (has no administration in time range)  gabapentin  (NEURONTIN ) capsule 300 mg (0 mg Oral Hold 04/19/24 2131)  escitalopram  (LEXAPRO ) tablet 20 mg (20 mg Oral Given 04/19/24 2036)  empagliflozin (JARDIANCE) tablet 25 mg (has no administration in time range)  labetalol  (NORMODYNE ) tablet 300 mg (has no  administration in time range)  lisinopril  (ZESTRIL ) tablet 20 mg (has no administration in time range)  icosapent  Ethyl (VASCEPA ) 1 g capsule 2 g (has no administration in time range)  aspirin  tablet 325 mg (325 mg Oral Given 04/19/24 2036)  cloNIDine  (CATAPRES  - Dosed in mg/24 hr) patch 0.3 mg (has no administration in time range)  acetaminophen  (TYLENOL ) tablet 650 mg (650 mg Oral Given 04/19/24 2036)  ondansetron  (ZOFRAN ) tablet 4 mg (has no administration in time range)  LORazepam  (ATIVAN ) injection 1 mg (1 mg Intravenous Given 04/19/24 1554)  charcoal activated (NO SORBITOL ) (ACTIDOSE-AQUA) suspension 50 g (50 g Oral Given 04/19/24 1534)  ondansetron  (ZOFRAN ) injection 4 mg (4 mg Intravenous Given 04/19/24 1558)  labetalol  (NORMODYNE ) injection 5 mg (5 mg Intravenous Given 04/19/24 1641)  labetalol  (NORMODYNE ) injection 10 mg (10 mg Intravenous Given 04/19/24 1752)    ED Course/ Medical Decision Making/ A&P                          Medical Decision Making Amount and/or Complexity of Data Reviewed Labs: ordered. Decision-making details documented in ED Course.  Risk OTC drugs. Prescription drug management. Decision regarding hospitalization.    This patient presents to the ED for concern of diphenhydramine  overdose and suicide attempt, this involves an extensive number of treatment options, and is a complaint that carries with it a high risk of complications and morbidity.  I considered the following differential and admission for this acute, potentially life threatening condition.   MDM:    Ingestion of unknown amount could have been as much as 25 pills of 25 mg, 625 mg or 7 mg/kg.  The patient seemingly truly either unaware are not forthcoming about the amount of Benadryl  he has taken.  Per EMS ingestion was at 230 and patient confirms that now.  Will give patient activated charcoal to drink. QTc 456 ms, QRS 102 ms on initial EKG. patient is A&O x 4, not confused or sedated.  He is  initially noted to be febrile to 101F, tachycardic in 110s, hypertensive to 190s/120s (also reports noncompliance w/ BP meds).   Clinical Course as of 04/19/24 2158  Mon Apr 19, 2024  1538 Spoke with Russ Course at [facility]. 4 hour tylenol  level, 4 hour EKG. Recommends CK, CMP, Mg. Benzos for agitation/tachycardia.  Tylenol  typically doesn't work  for fever if due to benadryl , put on a fan or cool cloths.  [HN]  1546 Attempted to drink tylenol  but became reportedly nauseated. Questionable nausea vs cooperation as he is suicidal. Given zofran . [HN]  1626 BP(!): 179/116 Mildly improved BP after ativan , will give labetalol  as well as patient reports noncompliance w/ BP meds [HN]  1628 CK Total: 270 neg [HN]  1628 Creatinine(!): 1.73 C/w baseline [HN]  1628 Potassium: 4.2 [HN]  1629 Magnesium: 1.9 [HN]  1655 Acetaminophen  (Tylenol ), S(!): <10 Neg; four -hour level ordered [HN]  1655 Alcohol, Ethyl (B): <15 [HN]  1655 Salicylate Lvl(!): <7.0 neg [HN]  1656 ECG Heart Rate: 93 improving [HN]  1803 SBP >200, will give additional labetalol  [HN]  1935 Acetaminophen  (Tylenol ), S(!): <10 neg [HN]  2000 RN discussed with poison control who recommended blood pressure control but otherwise stated patient could be observed for total of 6 hours and then would be good for discharge. Patient will of course not be discharged as he was IVC'd. Will enter in home blood pressure medications and consult to TTS.  [HN]  2138 Repeat EKG demonstrates QRS that is 111 ms.  The QRS is lengthening.  Discussed with poison control who would like for an additional 4-hour observation for him.  Will discuss with hospitalist. [HN]  2158 BP(!): 158/108 Improving appropriately.  [HN]  2158 Admitted to medicine. [HN]    Clinical Course User Index [HN] Merdis Stalling, MD    Labs: I Ordered, and personally interpreted labs.  The pertinent results include:  those listed above  Additional history obtained from  chart review.   Cardiac Monitoring: The patient was maintained on a cardiac monitor.  I personally viewed and interpreted the cardiac monitored which showed an underlying rhythm of: sinus tachycardia  Reevaluation: After the interventions noted above, I reevaluated the patient and found that they have :improved  Social Determinants of Health: Lives independently  Disposition:  Admitted to medicine  Co morbidities that complicate the patient evaluation  Past Medical History:  Diagnosis Date   Acute alcoholic pancreatitis    Arthritis    Chronic pain    Depression    Diabetes mellitus without complication (HCC)    Essential hypertension, benign    GERD (gastroesophageal reflux disease)    Headache    HTN (hypertension) 11/27/2015   Hyperlipidemia    Sleep apnea    could not tolerate CPAP   Stroke (HCC)    2016     Medicines Meds ordered this encounter  Medications   DISCONTD: LORazepam  (ATIVAN ) injection 2 mg   LORazepam  (ATIVAN ) injection 1 mg   charcoal activated (NO SORBITOL ) (ACTIDOSE-AQUA) suspension 50 g   ondansetron  (ZOFRAN ) injection 4 mg   labetalol  (NORMODYNE ) injection 5 mg   labetalol  (NORMODYNE ) injection 10 mg   amLODipine  (NORVASC ) tablet 10 mg   pantoprazole  (PROTONIX ) EC tablet 40 mg   atorvastatin  (LIPITOR) tablet 80 mg   melatonin tablet 3 mg   gabapentin  (NEURONTIN ) capsule 300 mg   escitalopram  (LEXAPRO ) tablet 20 mg   empagliflozin (JARDIANCE) tablet 25 mg   labetalol  (NORMODYNE ) tablet 300 mg   lisinopril  (ZESTRIL ) tablet 20 mg   icosapent  Ethyl (VASCEPA ) 1 g capsule 2 g   aspirin  tablet 325 mg   cloNIDine  (CATAPRES  - Dosed in mg/24 hr) patch 0.3 mg   acetaminophen  (TYLENOL ) tablet 650 mg   ondansetron  (ZOFRAN ) tablet 4 mg    I have reviewed the patients home medicines and have made adjustments as  needed  Problem List / ED Course: Problem List Items Addressed This Visit   None Visit Diagnoses       Intentional diphenhydramine   overdose, initial encounter [facility])    -  Primary     Suicidal ideation                       This note was created using dictation software, which may contain spelling or grammatical errors.    Merdis Stalling, MD 04/19/24 2201

## 2024-04-19 NOTE — Assessment & Plan Note (Addendum)
 01/30/2024- HgbA1c- 6.7.  - SSI- S - Hold Jardiance

## 2024-04-19 NOTE — ED Notes (Signed)
Faxed IVC paperwork to Magistrate Office 

## 2024-04-19 NOTE — Assessment & Plan Note (Addendum)
 Patient calm, not agitated, febrile to 101- Anticholinergic effect of Benadryl .  He tells me he took 10-12 Benadryl .  Denies co-ingestants. UDS- positive for THC.  Intentional overdose- because his of 20+ yrs wife left him.  Has not taken any of his medications in the past 2 weeks.  Initial EKG shows QRS of 102, QTc 456, total EKG shows QRS of 102, QTc of 473. - EDP Contacted Poison control, recommended checking EKG in 4 hours, check CK, CMP, magnesium, benzos for agitation/tachycardia.  On the Tylenol  typically does not work for fever due to Benadryl , puts on fans and cooling cloth. - 50mg  activated charcoal given in Ed - CK, magnesium, Tylenol , alcohol levels unremarkable -Repeat EKG in morning, follow QTc, QRS -Keep room cool -TTS consult - N/s 100cc/hr x 15hrs

## 2024-04-19 NOTE — H&P (Signed)
 History and Physical    Taylor Obrien:[id number] DOB: Mar 02, 1966 DOA: 04/19/2024  PCP: Sena Dam, NP   Patient coming from: Home  I have personally briefly reviewed patient's old medical records in [facility]  Chief Complaint: Benadryl  overdose  HPI: Taylor Obrien is a 58 y.o. male with medical history significant for hypertension, alcoholic pancreatitis, AKI on CKD, CVA, CKD stage III, diabetes mellitus, hypertension, OSA. Patient presented to the ED reporting that he took a lot of Benadryl  earlier.  He tells me he took maybe 10 to 12 tablets of Benadryl  which he bought at [employer]".  They were in the pack, and he removed each pill.  He denies taking any other medications.  He reports he has not taken his medications in about 2 weeks.  His wife usually helps him with his medication, but she left about 2 weeks ago.  Reports his wife was upset with him because he told the daughter (Who is a nurse)to leave.  Ambulates with a wheelchair.  He has been able to fix food for himself. Denies feeling different after taking the Benadryl  today, he is at his baseline.  ED Course: Temperature 101.  Heart rate 80-111.  Respiratory rate 16-23.  Blood pressure elevated up to 204/125.  O2 sats greater than 94% on room air. UA unremarkable.  UDS positive for THC.  Blood alcohol and Tylenol  level unremarkable.   EDP: Contacted Poison control, recommended checking EKG in 4 hours, check CK, CMP, magnesium, benzos for agitation/tachycardia.  On the Tylenol  typically does not work for fever due to Benadryl , puts on fans and cooling cloth. Patient IVCD in the ED.  50g Activated charcoal given.  5mg  and then 10mg  labetalol  given. [name] clonidine  patch also resumed. EKG showed initial QRS of 102 >> repeat QRS 107 >> 111.  Hospitalization requested.   Review of Systems: As per HPI all other systems reviewed and negative.  Past Medical History:  Diagnosis Date   Acute alcoholic pancreatitis     Arthritis    Chronic pain    Depression    Diabetes mellitus without complication (HCC)    Essential hypertension, benign    GERD (gastroesophageal reflux disease)    Headache    HTN (hypertension) 11/27/2015   Hyperlipidemia    Sleep apnea    could not tolerate CPAP   Stroke (HCC)    2016    Past Surgical History:  Procedure Laterality Date   BIOPSY  08/19/2022   Procedure: BIOPSY;  Surgeon: Vinetta Greening, DO;  [facility];  Service: Endoscopy;;   CLOSED REDUCTION MANDIBULAR FRACTURE W/ ARCH BARS     + multiple extractions   COLONOSCOPY WITH PROPOFOL  N/A 08/19/2022   Procedure: COLONOSCOPY WITH PROPOFOL ;  Surgeon: Vinetta Greening, DO;  Location: [facility];  Service: Endoscopy;  Laterality: N/A;  9:15am   Condyloma resection     ESOPHAGOGASTRODUODENOSCOPY (EGD) WITH PROPOFOL  N/A 08/19/2022   Procedure: ESOPHAGOGASTRODUODENOSCOPY (EGD) WITH PROPOFOL ;  Surgeon: Vinetta Greening, DO;  Location: [facility];  Service: Endoscopy;  Laterality: N/A;   MULTIPLE EXTRACTIONS WITH ALVEOLOPLASTY Bilateral 01/23/2018   Procedure: DENTAL EXTRACTION OF TEETH NUMBER ONE, TWO, THREE, FOUR, FIVE, SIX, SEVEN, EIGHT, NINE, TEN, ELEVEN, TWELVE, THIRTEEN, FOURTEEN, FIFTEEN, SIXTEEN, SEVENTEEN, TWENTY, TWENTY-ONE, TWENTY-TWO, TWENTY-THREE, TWENTY-FOUR, TWENTY-FIVE, TWENTY-SIX, TWENTY-SEVEN, TWENTY-EIGHT, TWENTY-NINE, THIRTY-TWO WITH ALVEOLOPLASTY;  Surgeon: Ascencion Lava, DDS;  Location: [facility] OR;  Service: Oral Surgery;  Lat   POLYPECTOMY  08/19/2022   Procedure: POLYPECTOMY;  Surgeon: Vinetta Greening, DO;  Location: [facility];  Service: Endoscopy;;   RADIOLOGY WITH ANESTHESIA N/A 11/18/2015   Procedure: RADIOLOGY WITH ANESTHESIA;  Surgeon: Luellen Sages, MD;  Location: [facility] OR;  Service: Radiology;  Laterality: N/A;   Removal foreign body right shoulder     Right rotator cuff repair     TEE WITHOUT CARDIOVERSION N/A 11/21/2015   Procedure: TRANSESOPHAGEAL ECHOCARDIOGRAM (TEE);   Surgeon: Darlis Eisenmenger, MD;  Location: [facility] ENDOSCOPY;  Service: Cardiovascular;  Laterality: N/A;   TOOTH EXTRACTION N/A 01/24/2018   Procedure: SUTURE ORAL WOUND;  Surgeon: Ascencion Lava, DDS;  Location: [facility] OR;  Service: Oral Surgery;  Laterality: N/A;     reports that he has been smoking cigarettes. He has a 15 pack-year smoking history. He has never used smokeless tobacco. He reports that he does not currently use alcohol. He reports current drug use. Drug: Marijuana.  Allergies  Allergen Reactions   Ibuprofen  Other (See Comments)    Avoid per PCP   Oxcarbazepine Other (See Comments)    Patient goes out of right state of mind.     Family History  Problem Relation Age of Onset   Diabetes Mother    Hypertension Mother    Drug abuse Mother    Hyperlipidemia Mother    Diabetes Father    Hypertension Father    Hyperlipidemia Father    Diabetes Brother    Hypertension Brother     Prior to Admission medications   Medication Sig Start Date End Date Taking? Authorizing Provider  amLODipine  (NORVASC ) 10 MG tablet Take 1 tablet (10 mg total) by mouth daily. 01/19/16   Kirsteins, Cecilia Coe, MD  aspirin  325 MG tablet Take 1 tablet (325 mg total) by mouth daily. 12/20/15   Love, Renay Carota, PA-C  atorvastatin  (LIPITOR) 80 MG tablet Take 80 mg by mouth at bedtime. 03/17/23   [provider]  cloNIDine  (CATAPRES  - DOSED IN MG/24 HR) 0.3 mg/24hr patch Place 1 patch (0.3 mg total) onto the skin once a week. Next change on monday 01/31/24   Shah, Pratik D, DO  diphenhydramine -acetaminophen  (TYLENOL  PM) 25-500 MG TABS tablet Take 1-2 tablets by mouth at bedtime as needed (for sleep). Depends on insomnia if takes 1-2 tablets    [provider]  empagliflozin (JARDIANCE) 25 MG TABS tablet Take 25 mg by mouth daily.     [provider]  escitalopram  (LEXAPRO ) 20 MG tablet Take 20 mg by mouth daily. 01/14/24   [provider]  fenofibrate  (TRICOR ) 145 MG tablet Take 145 mg  by mouth daily. 04/10/22   [provider]  gabapentin  (NEURONTIN ) 300 MG capsule Take 300 mg by mouth every evening. 11/19/23   [provider]  hydrOXYzine  (ATARAX ) 25 MG tablet Take 25 mg by mouth every 8 (eight) hours as needed for anxiety. 04/17/22   [provider]  ibuprofen  (ADVIL ) 600 MG tablet Take 600 mg by mouth every 6 (six) hours as needed for moderate pain (pain score 4-6).    [provider]  labetalol  (NORMODYNE ) 300 MG tablet Take 300 mg by mouth 2 (two) times daily. 03/08/22   [provider]  lisinopril  (ZESTRIL ) 20 MG tablet Take 20 mg by mouth daily. 09/08/23   [provider]  oxyCODONE  (OXY IR/ROXICODONE ) 5 MG immediate release tablet Take 5 mg by mouth every 8 (eight) hours as needed for moderate pain (pain score 4-6). 06/28/22   [provider]  pantoprazole  (PROTONIX ) 40 MG  tablet Take 1 tablet (40 mg total) by mouth at bedtime. 01/19/16   Kirsteins, Cecilia Coe, MD  traZODone  (DESYREL ) 50 MG tablet Take 50 mg by mouth at bedtime. 01/06/24   [provider]  VASCEPA  1 g capsule Take 2 g by mouth 2 (two) times daily. 03/12/22   [provider]    Physical Exam: Vitals:   04/19/24 2115 04/19/24 2119 04/19/24 2145 04/19/24 2151  BP: (!) 158/104  (!) 158/108   Pulse: 79 80 75 80  Resp: 16 17 15 19   Temp:      TempSrc:      SpO2: 94% 94% 95% 97%  Weight:      Height:        Constitutional: NAD, calm, comfortable Vitals:   04/19/24 2115 04/19/24 2119 04/19/24 2145 04/19/24 2151  BP: (!) 158/104  (!) 158/108   Pulse: 79 80 75 80  Resp: 16 17 15 19   Temp:      TempSrc:      SpO2: 94% 94% 95% 97%  Weight:      Height:       Eyes: PERRL, lids and conjunctivae normal ENMT: Mucous membranes are moist..  Neck: normal, supple, no masses, no thyromegaly Respiratory: clear to auscultation bilaterally, no wheezing, no crackles. Normal respiratory effort. No accessory muscle use.  Cardiovascular:  Regular rate and rhythm, no murmurs / rubs / gallops. No extremity edema. Extremities warm. Abdomen: no tenderness, no masses palpated. No hepatosplenomegaly. Bowel sounds positive.  Musculoskeletal: no clubbing / cyanosis. No joint deformity upper and lower extremities.  Skin: no rashes, lesions, ulcers. No induration Neurologic: No facial asymmetry, moving extremity spontaneously, speech fluent.  Psychiatric: Normal judgment and insight. Alert and oriented x 3. Normal mood.   Labs on Admission: I have personally reviewed following labs and imaging studies  CBC: Recent Labs  Lab 04/19/24 1553  WBC 8.4  NEUTROABS 6.6  HGB 14.5  HCT 43.2  MCV 94.1  PLT 252   Basic Metabolic Panel: Recent Labs  Lab 04/19/24 1553  NA 137  K 4.2  CL 105  CO2 21*  GLUCOSE 127*  BUN 14  CREATININE 1.73*  CALCIUM  9.2  MG 1.9   GFR: Estimated Creatinine Clearance: 49 mL/min (A) (by C-G formula based on SCr of 1.73 mg/dL (H)). Liver Function Tests: Recent Labs  Lab 04/19/24 1553  AST 26  ALT 15  ALKPHOS 72  BILITOT 0.5  PROT 7.2  ALBUMIN 3.3*   Cardiac Enzymes: Recent Labs  Lab 04/19/24 1553  CKTOTAL 270   CBG: Recent Labs  Lab 04/19/24 1605 04/19/24 2148  GLUCAP 145* 157*   Urine analysis:    Component Value Date/Time   COLORURINE STRAW (A) 04/19/2024 1540   APPEARANCEUR CLEAR 04/19/2024 1540   LABSPEC 1.005 04/19/2024 1540   PHURINE 6.0 04/19/2024 1540   GLUCOSEU 50 (A) 04/19/2024 1540   HGBUR SMALL (A) 04/19/2024 1540   BILIRUBINUR NEGATIVE 04/19/2024 1540   KETONESUR NEGATIVE 04/19/2024 1540   PROTEINUR >=300 (A) 04/19/2024 1540   UROBILINOGEN 0.2 09/22/2015 0750   NITRITE NEGATIVE 04/19/2024 1540   LEUKOCYTESUR NEGATIVE 04/19/2024 1540    Radiological Exams on Admission: No results found.  EKG: Independently reviewed.  Last EKG shows sinus rhythm rate 89, QTc 473.  QRS 111.  T wave abnormalities in lead I, aVL, V5 and V6 which are old and unchanged from  prior.  Assessment/Plan Principal Problem:   Intentional diphenhydramine  overdose [facility]) Active Problems:   Hypertensive urgency  Diabetes mellitus (HCC)   Marijuana abuse   Chronic kidney disease, stage 3b (HCC)  Assessment and Plan: * Intentional diphenhydramine  overdose (HCC) Patient calm, not agitated, febrile to 101- Anticholinergic effect of Benadryl .  He tells me he took 10-12 Benadryl .  Denies co-ingestants. UDS- positive for THC.  Intentional overdose- because his of 20+ yrs wife left him.  Has not taken any of his medications in the past 2 weeks.  Initial EKG shows QRS of 102, QTc 456, total EKG shows QRS of 102, QTc of 473. - EDP Contacted Poison control, recommended checking EKG in 4 hours, check CK, CMP, magnesium, benzos for agitation/tachycardia.  On the Tylenol  typically does not work for fever due to Benadryl , puts on fans and cooling cloth. - 50mg  activated charcoal given in Ed - CK, magnesium, Tylenol , alcohol levels unremarkable -Repeat EKG in morning, follow QTc, QRS -Keep room cool -TTS consult - N/s 100cc/hr x 15hrs  Hypertensive urgency Blood pressure elevated up to 204/125, now 160s to 170s.  He had his clonidine  patch on him, but he has not taken any of his other antihypertensives in 2 weeks.  - Resume clonidine  0.3mg  patch - As needed labetalol  for systolic greater than 180 -Resume Norvasc , labetalol , losartan  Diabetes mellitus (HCC) 01/30/2024- HgbA1c- 6.7.  - SSI- S - Hold Jardiance  Chronic kidney disease, stage 3b (HCC) CKD stage III A/B.  Creatinine 1.76.  At baseline.   DVT prophylaxis: Lovenox  Code Status: Full code Family Communication: None at bedside Disposition Plan: ~ 1- 2 days Consults called: Poison Control, TTS Admission status:  Obs Tele   Author: Pati Bonine, MD 04/19/2024 11:18 PM  For on call review [email or web address].

## 2024-04-19 NOTE — ED Notes (Signed)
 Pt given sandwich

## 2024-04-19 NOTE — ED Notes (Signed)
 Pt states that he is unable to take his medicine because no one gives it to him. He is not able to get his meds on his own or be able to reach it. His family at home stopped helping him and taking care of him a week or so ago. He is in a wheelchair and can only do so much for himself. States " his wife decided to leave him after 20 some years together". Pt gets upset and tearful when talking about his children and wife.

## 2024-04-19 NOTE — Assessment & Plan Note (Signed)
 CKD stage III A/B.  Creatinine 1.76.  At baseline.

## 2024-04-19 NOTE — ED Notes (Signed)
 Pt stated, "my back is hurting. Can I have my pain med." EDP notified

## 2024-04-19 NOTE — Assessment & Plan Note (Signed)
 Blood pressure elevated up to 204/125, now 160s to 170s.  He had his clonidine  patch on him, but he has not taken any of his other antihypertensives in 2 weeks.  - Resume clonidine  0.3mg  patch - As needed labetalol  for systolic greater than 180 -Resume Norvasc , labetalol , losartan

## 2024-04-19 NOTE — ED Notes (Signed)
 Pt has been undressed and placed in a gown. Security came to wand pt. Pt is resting comfortably at this time.

## 2024-04-19 NOTE — BH Assessment (Signed)
@   2200 referred to [facility]IS. TTS consult will be completed by [facility]IS. IRIS Coordinator will communicate in established secure chat assessment time and provider name. Thanks

## 2024-04-19 NOTE — ED Notes (Addendum)
 IVC paperwork has been faxed to [facility] by Diplomatic Services operational officer.

## 2024-04-19 NOTE — ED Triage Notes (Signed)
 Pt stated, "I took a lot of bendaryl earlier today around 2:30." EMS: 226/150, HR 130. Stated he stopped taking bp medication two weeks ago. HX strokes, slight confusion at baseline per EMS.   BP is elevated. Respiratory WNL. ETOH on board.

## 2024-04-19 NOTE — ED Notes (Signed)
 Pt given ginger ale.

## 2024-04-20 DIAGNOSIS — F1210 Cannabis abuse, uncomplicated: Secondary | ICD-10-CM | POA: Diagnosis present

## 2024-04-20 DIAGNOSIS — R45851 Suicidal ideations: Secondary | ICD-10-CM | POA: Diagnosis present

## 2024-04-20 DIAGNOSIS — R000 Tachycardia, unspecified: Secondary | ICD-10-CM | POA: Diagnosis present

## 2024-04-20 DIAGNOSIS — Z813 Family history of other psychoactive substance abuse and dependence: Secondary | ICD-10-CM | POA: Diagnosis not present

## 2024-04-20 DIAGNOSIS — R509 Fever, unspecified: Secondary | ICD-10-CM | POA: Diagnosis present

## 2024-04-20 DIAGNOSIS — Z993 Dependence on wheelchair: Secondary | ICD-10-CM | POA: Diagnosis not present

## 2024-04-20 DIAGNOSIS — Z5941 Food insecurity: Secondary | ICD-10-CM | POA: Diagnosis not present

## 2024-04-20 DIAGNOSIS — Z83438 Family history of other disorder of lipoprotein metabolism and other lipidemia: Secondary | ICD-10-CM | POA: Diagnosis not present

## 2024-04-20 DIAGNOSIS — T450X2D Poisoning by antiallergic and antiemetic drugs, intentional self-harm, subsequent encounter: Secondary | ICD-10-CM | POA: Diagnosis not present

## 2024-04-20 DIAGNOSIS — Z5900 Homelessness unspecified: Secondary | ICD-10-CM | POA: Diagnosis not present

## 2024-04-20 DIAGNOSIS — Z7982 Long term (current) use of aspirin: Secondary | ICD-10-CM | POA: Diagnosis not present

## 2024-04-20 DIAGNOSIS — N1832 Chronic kidney disease, stage 3b: Secondary | ICD-10-CM | POA: Diagnosis present

## 2024-04-20 DIAGNOSIS — I10 Essential (primary) hypertension: Secondary | ICD-10-CM | POA: Diagnosis present

## 2024-04-20 DIAGNOSIS — Z833 Family history of diabetes mellitus: Secondary | ICD-10-CM | POA: Diagnosis not present

## 2024-04-20 DIAGNOSIS — Z1152 Encounter for screening for COVID-19: Secondary | ICD-10-CM | POA: Diagnosis not present

## 2024-04-20 DIAGNOSIS — Z8249 Family history of ischemic heart disease and other diseases of the circulatory system: Secondary | ICD-10-CM | POA: Diagnosis not present

## 2024-04-20 DIAGNOSIS — I129 Hypertensive chronic kidney disease with stage 1 through stage 4 chronic kidney disease, or unspecified chronic kidney disease: Secondary | ICD-10-CM | POA: Diagnosis present

## 2024-04-20 DIAGNOSIS — Z7984 Long term (current) use of oral hypoglycemic drugs: Secondary | ICD-10-CM | POA: Diagnosis not present

## 2024-04-20 DIAGNOSIS — N1831 Chronic kidney disease, stage 3a: Secondary | ICD-10-CM | POA: Diagnosis not present

## 2024-04-20 DIAGNOSIS — F32A Depression, unspecified: Secondary | ICD-10-CM | POA: Diagnosis present

## 2024-04-20 DIAGNOSIS — Z9151 Personal history of suicidal behavior: Secondary | ICD-10-CM | POA: Diagnosis not present

## 2024-04-20 DIAGNOSIS — Z62810 Personal history of physical and sexual abuse in childhood: Secondary | ICD-10-CM | POA: Diagnosis not present

## 2024-04-20 DIAGNOSIS — E785 Hyperlipidemia, unspecified: Secondary | ICD-10-CM | POA: Diagnosis present

## 2024-04-20 DIAGNOSIS — Z79899 Other long term (current) drug therapy: Secondary | ICD-10-CM | POA: Diagnosis not present

## 2024-04-20 DIAGNOSIS — Z886 Allergy status to analgesic agent status: Secondary | ICD-10-CM | POA: Diagnosis not present

## 2024-04-20 DIAGNOSIS — Z91148 Patient's other noncompliance with medication regimen for other reason: Secondary | ICD-10-CM | POA: Diagnosis not present

## 2024-04-20 DIAGNOSIS — I160 Hypertensive urgency: Secondary | ICD-10-CM | POA: Diagnosis present

## 2024-04-20 DIAGNOSIS — F17210 Nicotine dependence, cigarettes, uncomplicated: Secondary | ICD-10-CM | POA: Diagnosis present

## 2024-04-20 DIAGNOSIS — F332 Major depressive disorder, recurrent severe without psychotic features: Secondary | ICD-10-CM | POA: Diagnosis present

## 2024-04-20 DIAGNOSIS — E1122 Type 2 diabetes mellitus with diabetic chronic kidney disease: Secondary | ICD-10-CM | POA: Diagnosis present

## 2024-04-20 DIAGNOSIS — Z5982 Transportation insecurity: Secondary | ICD-10-CM | POA: Diagnosis not present

## 2024-04-20 DIAGNOSIS — T450X2A Poisoning by antiallergic and antiemetic drugs, intentional self-harm, initial encounter: Secondary | ICD-10-CM | POA: Diagnosis present

## 2024-04-20 DIAGNOSIS — E669 Obesity, unspecified: Secondary | ICD-10-CM | POA: Diagnosis present

## 2024-04-20 DIAGNOSIS — Z87891 Personal history of nicotine dependence: Secondary | ICD-10-CM | POA: Diagnosis not present

## 2024-04-20 DIAGNOSIS — K219 Gastro-esophageal reflux disease without esophagitis: Secondary | ICD-10-CM | POA: Diagnosis present

## 2024-04-20 DIAGNOSIS — E119 Type 2 diabetes mellitus without complications: Secondary | ICD-10-CM | POA: Diagnosis present

## 2024-04-20 DIAGNOSIS — Z8673 Personal history of transient ischemic attack (TIA), and cerebral infarction without residual deficits: Secondary | ICD-10-CM | POA: Diagnosis not present

## 2024-04-20 DIAGNOSIS — Z888 Allergy status to other drugs, medicaments and biological substances status: Secondary | ICD-10-CM | POA: Diagnosis not present

## 2024-04-20 LAB — CBC
HCT: 36.8 % — ABNORMAL LOW (ref 39.0–52.0)
Hemoglobin: 12.2 g/dL — ABNORMAL LOW (ref 13.0–17.0)
MCH: 31.4 pg (ref 26.0–34.0)
MCHC: 33.2 g/dL (ref 30.0–36.0)
MCV: 94.6 fL (ref 80.0–100.0)
Platelets: 210 10*3/uL (ref 150–400)
RBC: 3.89 MIL/uL — ABNORMAL LOW (ref 4.22–5.81)
RDW: 15.6 % — ABNORMAL HIGH (ref 11.5–15.5)
WBC: 8 10*3/uL (ref 4.0–10.5)
nRBC: 0 % (ref 0.0–0.2)

## 2024-04-20 LAB — GLUCOSE, CAPILLARY
Glucose-Capillary: 130 mg/dL — ABNORMAL HIGH (ref 70–99)
Glucose-Capillary: 106 mg/dL — ABNORMAL HIGH (ref 70–99)
Glucose-Capillary: 156 mg/dL — ABNORMAL HIGH (ref 70–99)
Glucose-Capillary: 128 mg/dL — ABNORMAL HIGH (ref 70–99)

## 2024-04-20 LAB — BASIC METABOLIC PANEL WITH GFR
Anion gap: 11 (ref 5–15)
BUN: 15 mg/dL (ref 6–20)
CO2: 22 mmol/L (ref 22–32)
Calcium: 8.4 mg/dL — ABNORMAL LOW (ref 8.9–10.3)
Chloride: 106 mmol/L (ref 98–111)
Creatinine, Ser: 1.9 mg/dL — ABNORMAL HIGH (ref 0.61–1.24)
GFR, Estimated: 41 mL/min — ABNORMAL LOW (ref >60)
Glucose, Bld: 119 mg/dL — ABNORMAL HIGH (ref 70–99)
Potassium: 4.1 mmol/L (ref 3.5–5.1)
Sodium: 139 mmol/L (ref 135–145)

## 2024-04-20 LAB — HEPATIC FUNCTION PANEL
ALT: 12 U/L (ref 0–44)
AST: 20 U/L (ref 15–41)
Albumin: 2.4 g/dL — ABNORMAL LOW (ref 3.5–5.0)
Alkaline Phosphatase: 49 U/L (ref 38–126)
Bilirubin, Direct: <0.1 mg/dL (ref 0.0–0.2)
Total Bilirubin: 0.3 mg/dL (ref 0.0–1.2)
Total Protein: 5.3 g/dL — ABNORMAL LOW (ref 6.5–8.1)

## 2024-04-20 LAB — ACETAMINOPHEN LEVEL: Acetaminophen (Tylenol), Serum: <10 ug/mL — ABNORMAL LOW (ref 10–30)

## 2024-04-20 LAB — MAGNESIUM: Magnesium: 1.7 mg/dL (ref 1.7–2.4)

## 2024-04-20 LAB — PHOSPHORUS: Phosphorus: 3.8 mg/dL (ref 2.5–4.6)

## 2024-04-20 LAB — CK: Total CK: 169 U/L (ref 49–397)

## 2024-04-20 MED ORDER — INSULIN ASPART 100 UNIT/ML IJ SOLN
0.0000 [IU] | Freq: Three times a day (TID) | INTRAMUSCULAR | Status: DC
  Administered 2024-04-20: 1 [IU] via SUBCUTANEOUS
  Administered 2024-04-20 – 2024-04-21 (×2): 2 [IU] via SUBCUTANEOUS
  Administered 2024-04-21: 1 [IU] via SUBCUTANEOUS

## 2024-04-20 MED ORDER — POLYETHYLENE GLYCOL 3350 17 G PO PACK: 17.0000 g | PACK | Freq: Every day | ORAL | Status: DC | PRN

## 2024-04-20 MED ORDER — ONDANSETRON HCL 4 MG/2ML IJ SOLN: 4.0000 mg | Freq: Four times a day (QID) | INTRAMUSCULAR | Status: DC | PRN

## 2024-04-20 MED ORDER — ENOXAPARIN SODIUM 40 MG/0.4ML IJ SOSY
40.0000 mg | PREFILLED_SYRINGE | INTRAMUSCULAR | Status: DC
  Administered 2024-04-20 – 2024-04-21 (×2): 40 mg via SUBCUTANEOUS
  Filled 2024-04-20 (×2): qty 0.4

## 2024-04-20 MED ORDER — CLONIDINE HCL 0.2 MG/24HR TD PTWK
0.3000 mg | MEDICATED_PATCH | TRANSDERMAL | Status: DC
  Administered 2024-04-20: 0.3 mg via TRANSDERMAL
  Filled 2024-04-20: qty 1

## 2024-04-20 MED ORDER — ONDANSETRON HCL 4 MG PO TABS: 4.0000 mg | ORAL_TABLET | Freq: Four times a day (QID) | ORAL | Status: DC | PRN

## 2024-04-20 MED ORDER — ACETAMINOPHEN 650 MG RE SUPP: 650.0000 mg | Freq: Four times a day (QID) | RECTAL | Status: DC | PRN

## 2024-04-20 MED ORDER — MAGNESIUM SULFATE 2 GM/50ML IV SOLN
2.0000 g | Freq: Once | INTRAVENOUS | Status: AC
  Administered 2024-04-20: 2 g via INTRAVENOUS
  Filled 2024-04-20: qty 50

## 2024-04-20 MED ORDER — LIDOCAINE 5 % EX PTCH
2.0000 | MEDICATED_PATCH | CUTANEOUS | Status: DC
  Administered 2024-04-20 – 2024-04-21 (×2): 2 via TRANSDERMAL
  Filled 2024-04-20 (×2): qty 2

## 2024-04-20 MED ORDER — SODIUM CHLORIDE 0.9 % IV SOLN: INTRAVENOUS | Status: AC

## 2024-04-20 MED ORDER — ACETAMINOPHEN 325 MG PO TABS
650.0000 mg | ORAL_TABLET | Freq: Four times a day (QID) | ORAL | Status: DC | PRN
  Administered 2024-04-20 – 2024-04-21 (×2): 650 mg via ORAL
  Filled 2024-04-20 (×2): qty 2

## 2024-04-20 MED ORDER — INSULIN ASPART 100 UNIT/ML IJ SOLN: 0.0000 [IU] | Freq: Every day | INTRAMUSCULAR | Status: DC

## 2024-04-20 NOTE — Progress Notes (Signed)
 PROGRESS NOTE   Taylor Obrien  ZOX:[id number] DOB: November 13, 1966 DOA: 04/19/2024 PCP: Sena Dam, NP   Chief Complaint  Patient presents with   Drug Overdose    Benadryl     Level of care: Telemetry  Brief Admission History:  58 y.o. male with medical history significant for hypertension, alcoholic pancreatitis, AKI on CKD, CVA, CKD stage III, diabetes mellitus, hypertension, OSA.  Patient presented to the ED reporting that he took a lot of Benadryl  earlier.  He tells me he took maybe 10 to 12 tablets of Benadryl  which he bought at [employer]".  They were in the pack, and he removed each pill.  He denies taking any other medications.  He reports he has not taken his medications in about 2 weeks.  His wife usually helps him with his medication, but she left about 2 weeks ago.  Reports his wife was upset with him because he told the daughter (Who is a nurse)to leave.  Ambulates with a wheelchair.  He has been able to fix food for himself.  Denies feeling different after taking the Benadryl  today, he is at his baseline.   ED Course: Temperature 101.  Heart rate 80-111.  Respiratory rate 16-23.  Blood pressure elevated up to 204/125.  O2 sats greater than 94% on room air. UA unremarkable.  UDS positive for THC.  Blood alcohol and Tylenol  level unremarkable.   EDP: Contacted Poison control, recommended checking EKG in 4 hours, check CK, CMP, magnesium, benzos for agitation/tachycardia.  On the Tylenol  typically does not work for fever due to Benadryl , puts on fans and cooling cloth. Patient IVCD in the ED.  50g Activated charcoal given.  5mg  and then 10mg  labetalol  given. [name] clonidine  patch also resumed. EKG showed initial QRS of 102 >> repeat QRS 107 >> 111.  Hospitalization requested.    Assessment and Plan: Intentional diphenhydramine  overdose  Patient calm, not agitated, febrile to 101- Anticholinergic effect of Benadryl .  He tells me he took 10-12 Benadryl .  Denies co-ingestants. UDS-  positive for THC.  Intentional overdose- because his of 20+ yrs wife left him.  Has not taken any of his medications in the past 2 weeks.  Initial EKG shows QRS of 102, QTc 456, total EKG shows QRS of 102, QTc of 473. - EDP Contacted Poison control, recommended checking EKG in 4 hours, check CK, CMP, magnesium, benzos for agitation/tachycardia.  On the Tylenol  typically does not work for fever due to Benadryl , puts on fans and cooling cloth. - 50mg  activated charcoal given in ED - CK, magnesium, Tylenol , alcohol levels unremarkable - repeat EKG 5/13 is reassuring - Pt received IV fluid hydration - Medically cleared by MD 5/13 to speak with TTS for formal psychiatric evaluation and disposition determination  Chronic kidney disease, stage 3b  - stable.  Hypertensive urgency Blood pressure elevated up to 204/125, now 160s to 170s.  He had his clonidine  patch on him, but he has not taken any of his other antihypertensives in 2 weeks.  - Resume clonidine  0.3mg  patch - As needed labetalol  for systolic greater than 180 - Resumed home dose amlodipine , labetalol , losartan  Diabetes mellitus, type 2, controlled, not on insulin  01/30/2024- HgbA1c- 6.7.  - SSI- S - Hold Jardiance for now  DVT prophylaxis: enoxaparin  Code Status: Full  Family Communication:  Disposition: TBD    Consultants:  TTS (behavioral health)  Procedures:   Antimicrobials:    Subjective: Pt having some back pain otherwise no other complaints  Objective: Vitals:  04/20/24 0400 04/20/24 0611 04/20/24 0802 04/20/24 0811  BP: (!) 146/98 (!) 142/101 (!) 167/93 (!) 167/93  Pulse: 78 82 78 83  Resp: 18 20 18    Temp: 97.8 F (36.6 C) 97.9 F (36.6 C)    TempSrc: Oral Oral    SpO2: 94% 95% 100%   Weight:      Height:        Intake/Output Summary (Last 24 hours) at 04/20/2024 1118 Last data filed at 04/20/2024 0853 Gross per 24 hour  Intake 618.28 ml  Output 850 ml  Net -231.72 ml   Filed Weights   04/19/24  1519 04/19/24 2323  Weight: 87.2 kg 80.5 kg   Examination:  General exam: Appears calm and comfortable  Respiratory system: Clear to auscultation. Respiratory effort normal. Cardiovascular system: normal S1 & S2 heard. No JVD, murmurs, rubs, gallops or clicks. No pedal edema. Gastrointestinal system: Abdomen is nondistended, soft and nontender. No organomegaly or masses felt. Normal bowel sounds heard. Central nervous system: Alert and oriented. No focal neurological deficits. Extremities: Symmetric 5 x 5 power. Skin: No rashes, lesions or ulcers. Psychiatry: Judgement and insight appear poor. Mood & affect flat.   Data Reviewed: I have personally reviewed following labs and imaging studies  CBC: Recent Labs  Lab 04/19/24 1553 04/20/24 0427  WBC 8.4 8.0  NEUTROABS 6.6  --   HGB 14.5 12.2*  HCT 43.2 36.8*  MCV 94.1 94.6  PLT 252 210    Basic Metabolic Panel: Recent Labs  Lab 04/19/24 1553 04/20/24 0427  NA 137 139  K 4.2 4.1  CL 105 106  CO2 21* 22  GLUCOSE 127* 119*  BUN 14 15  CREATININE 1.73* 1.90*  CALCIUM  9.2 8.4*  MG 1.9 1.7  PHOS  --  3.8   CBG: Recent Labs  Lab 04/19/24 1605 04/19/24 2148 04/19/24 2327 04/20/24 0719  GLUCAP 145* 157* 138* 130*    No results found for this or any previous visit (from the past 240 hours).   Radiology Studies: No results found.  Scheduled Meds:  amLODipine   10 mg Oral Daily   aspirin   325 mg Oral Daily   atorvastatin   80 mg Oral QHS   cloNIDine   0.3 mg Transdermal Weekly   enoxaparin  (LOVENOX ) injection  40 mg Subcutaneous Q24H   gabapentin   300 mg Oral QHS   insulin  aspart  0-5 Units Subcutaneous QHS   insulin  aspart  0-9 Units Subcutaneous TID WC   labetalol   300 mg Oral BID   lidocaine   2 patch Transdermal Q24H   lisinopril   20 mg Oral Daily   melatonin  3 mg Oral QHS   pantoprazole   40 mg Oral QHS   Continuous Infusions:  sodium chloride  50 mL/hr at 04/20/24 0810    LOS: 0 days   Time spent: 43  mins  [name], MD How to contact the [facility] Attending or Consulting provider 7A - 7P or covering provider during after hours 7P -7A, for this patient?  Check the care team in [facility] and look for a) attending/consulting [facility] provider listed and b) the TR[facility] team listed Log into [email or web address] to find provider on call.  Locate the [facility] provider you are looking for under [name] and page to a number that you can be directly reached. If you still have difficulty reaching the provider, please page the [facility] (Director on Call) for the Hospitalists listed on amion for assistance.  04/20/2024, 11:18 AM

## 2024-04-20 NOTE — Plan of Care (Signed)
   Problem: Activity: Goal: Risk for activity intolerance will decrease Outcome: Progressing   Problem: Coping: Goal: Level of anxiety will decrease Outcome: Progressing

## 2024-04-20 NOTE — BH Assessment (Signed)
 05/13: Per Yolonda Henderson, IRIS coordinator patient is scheduled to be seen by provider Sherifat Forcey,NP at 800am for psych consult.
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
## 2024-04-20 NOTE — Progress Notes (Signed)
   04/20/24 0939  TOC Brief Assessment  Insurance and Status Reviewed  Patient has primary care physician Yes  Home environment has been reviewed Home  Prior level of function: Independent  Prior/Current Home Services No current home services  Readmission risk has been reviewed Yes  Transition of care needs transition of care needs identified, TOC will continue to follow   Patient is medically clear, awaiting TTS eval for discharge planning.   Transition of Care Department [facility]) has reviewed patient and no TOC needs have been identified at this time. We will continue to monitor patient advancement through interdisciplinary progression rounds. If new patient transition needs arise, please place a TOC consult.

## 2024-04-20 NOTE — BH Assessment (Signed)
 Clinician messaged Claudia Cuff.Cockram, RN; Geraldina Klinefelter, RN and Cheyanne B. Draughn, LPN: "Hey. It's Trey with TTS. Is the pt able to engage in the assessment, if so is the pt under IVC? Also is the pt medically cleared?"    Rosi Converse, MS, [facility], [facility] Triage Specialist [phone]

## 2024-04-20 NOTE — Hospital Course (Signed)
 58 y.o. male with medical history significant for hypertension, alcoholic pancreatitis, AKI on CKD, CVA, CKD stage III, diabetes mellitus, hypertension, OSA.  Patient presented to the ED reporting that he took a lot of Benadryl  earlier.  He tells me he took maybe 10 to 12 tablets of Benadryl  which he bought at [employer]".  They were in the pack, and he removed each pill.  He denies taking any other medications.  He reports he has not taken his medications in about 2 weeks.  His wife usually helps him with his medication, but she left about 2 weeks ago.  Reports his wife was upset with him because he told the daughter (Who is a nurse)to leave.  Ambulates with a wheelchair.  He has been able to fix food for himself.  Denies feeling different after taking the Benadryl  today, he is at his baseline.   ED Course: Temperature 101.  Heart rate 80-111.  Respiratory rate 16-23.  Blood pressure elevated up to 204/125.  O2 sats greater than 94% on room air. UA unremarkable.  UDS positive for THC.  Blood alcohol and Tylenol  level unremarkable.   EDP: Contacted Poison control, recommended checking EKG in 4 hours, check CK, CMP, magnesium, benzos for agitation/tachycardia.  On the Tylenol  typically does not work for fever due to Benadryl , puts on fans and cooling cloth. Patient IVCD in the ED.  50g Activated charcoal given.  5mg  and then 10mg  labetalol  given. [name] clonidine  patch also resumed. EKG showed initial QRS of 102 >> repeat QRS 107 >> 111.  Hospitalization requested.

## 2024-04-20 NOTE — BH Assessment (Signed)
 05/13 @0225 : Checked for updates on IRIS consult. Yolonda Henderson IRIS coordinator advised that he would update nursing staff via secure chat when there is a scheduled time for the provider to complete the assessment.

## 2024-04-21 ENCOUNTER — Other Ambulatory Visit: Payer: Self-pay

## 2024-04-21 ENCOUNTER — Inpatient Hospital Stay
Admission: AD | Admit: 2024-04-21 | Discharge: 2024-04-26 | DRG: 885 | Disposition: A | Discharge status: Home / Self Care | Payer: Self-pay | Source: Intra-hospital | Attending: Psychiatry | Admitting: Psychiatry

## 2024-04-21 ENCOUNTER — Encounter: Payer: Self-pay | Admitting: Psychiatry

## 2024-04-21 DIAGNOSIS — R45851 Suicidal ideations: Secondary | ICD-10-CM | POA: Diagnosis present

## 2024-04-21 DIAGNOSIS — Z886 Allergy status to analgesic agent status: Secondary | ICD-10-CM | POA: Diagnosis not present

## 2024-04-21 DIAGNOSIS — Z993 Dependence on wheelchair: Secondary | ICD-10-CM

## 2024-04-21 DIAGNOSIS — N1832 Chronic kidney disease, stage 3b: Secondary | ICD-10-CM

## 2024-04-21 DIAGNOSIS — Z7982 Long term (current) use of aspirin: Secondary | ICD-10-CM

## 2024-04-21 DIAGNOSIS — F17210 Nicotine dependence, cigarettes, uncomplicated: Secondary | ICD-10-CM | POA: Diagnosis present

## 2024-04-21 DIAGNOSIS — Z5900 Homelessness unspecified: Secondary | ICD-10-CM | POA: Diagnosis not present

## 2024-04-21 DIAGNOSIS — I10 Essential (primary) hypertension: Secondary | ICD-10-CM | POA: Diagnosis present

## 2024-04-21 DIAGNOSIS — E785 Hyperlipidemia, unspecified: Secondary | ICD-10-CM | POA: Diagnosis present

## 2024-04-21 DIAGNOSIS — Z62810 Personal history of physical and sexual abuse in childhood: Secondary | ICD-10-CM | POA: Diagnosis not present

## 2024-04-21 DIAGNOSIS — Z79899 Other long term (current) drug therapy: Secondary | ICD-10-CM | POA: Diagnosis not present

## 2024-04-21 DIAGNOSIS — T450X2A Poisoning by antiallergic and antiemetic drugs, intentional self-harm, initial encounter: Secondary | ICD-10-CM | POA: Diagnosis not present

## 2024-04-21 DIAGNOSIS — Z5982 Transportation insecurity: Secondary | ICD-10-CM

## 2024-04-21 DIAGNOSIS — Z813 Family history of other psychoactive substance abuse and dependence: Secondary | ICD-10-CM

## 2024-04-21 DIAGNOSIS — K219 Gastro-esophageal reflux disease without esophagitis: Secondary | ICD-10-CM | POA: Diagnosis present

## 2024-04-21 DIAGNOSIS — F332 Major depressive disorder, recurrent severe without psychotic features: Secondary | ICD-10-CM | POA: Diagnosis present

## 2024-04-21 DIAGNOSIS — Z83438 Family history of other disorder of lipoprotein metabolism and other lipidemia: Secondary | ICD-10-CM | POA: Diagnosis not present

## 2024-04-21 DIAGNOSIS — Z5941 Food insecurity: Secondary | ICD-10-CM | POA: Diagnosis not present

## 2024-04-21 DIAGNOSIS — Z888 Allergy status to other drugs, medicaments and biological substances status: Secondary | ICD-10-CM | POA: Diagnosis not present

## 2024-04-21 DIAGNOSIS — Z7984 Long term (current) use of oral hypoglycemic drugs: Secondary | ICD-10-CM | POA: Diagnosis not present

## 2024-04-21 DIAGNOSIS — Z9151 Personal history of suicidal behavior: Secondary | ICD-10-CM | POA: Diagnosis not present

## 2024-04-21 DIAGNOSIS — Z833 Family history of diabetes mellitus: Secondary | ICD-10-CM | POA: Diagnosis not present

## 2024-04-21 DIAGNOSIS — T450X2D Poisoning by antiallergic and antiemetic drugs, intentional self-harm, subsequent encounter: Secondary | ICD-10-CM | POA: Diagnosis not present

## 2024-04-21 DIAGNOSIS — E119 Type 2 diabetes mellitus without complications: Secondary | ICD-10-CM | POA: Diagnosis present

## 2024-04-21 DIAGNOSIS — Z8249 Family history of ischemic heart disease and other diseases of the circulatory system: Secondary | ICD-10-CM | POA: Diagnosis not present

## 2024-04-21 DIAGNOSIS — Z8673 Personal history of transient ischemic attack (TIA), and cerebral infarction without residual deficits: Secondary | ICD-10-CM | POA: Diagnosis not present

## 2024-04-21 LAB — COMPREHENSIVE METABOLIC PANEL WITH GFR
ALT: 10 U/L (ref 0–44)
AST: 18 U/L (ref 15–41)
Albumin: 2.6 g/dL — ABNORMAL LOW (ref 3.5–5.0)
Alkaline Phosphatase: 54 U/L (ref 38–126)
Anion gap: 6 (ref 5–15)
BUN: 12 mg/dL (ref 6–20)
CO2: 23 mmol/L (ref 22–32)
Calcium: 8.3 mg/dL — ABNORMAL LOW (ref 8.9–10.3)
Chloride: 108 mmol/L (ref 98–111)
Creatinine, Ser: 1.92 mg/dL — ABNORMAL HIGH (ref 0.61–1.24)
GFR, Estimated: 41 mL/min — ABNORMAL LOW (ref >60)
Glucose, Bld: 115 mg/dL — ABNORMAL HIGH (ref 70–99)
Potassium: 3.7 mmol/L (ref 3.5–5.1)
Sodium: 137 mmol/L (ref 135–145)
Total Bilirubin: 0.3 mg/dL (ref 0.0–1.2)
Total Protein: 5.6 g/dL — ABNORMAL LOW (ref 6.5–8.1)

## 2024-04-21 LAB — GLUCOSE, CAPILLARY
Glucose-Capillary: 126 mg/dL — ABNORMAL HIGH (ref 70–99)
Glucose-Capillary: 167 mg/dL — ABNORMAL HIGH (ref 70–99)
Glucose-Capillary: 132 mg/dL — ABNORMAL HIGH (ref 70–99)
Glucose-Capillary: 173 mg/dL — ABNORMAL HIGH (ref 70–99)

## 2024-04-21 LAB — CBC
HCT: 37 % — ABNORMAL LOW (ref 39.0–52.0)
Hemoglobin: 12.5 g/dL — ABNORMAL LOW (ref 13.0–17.0)
MCH: 31.8 pg (ref 26.0–34.0)
MCHC: 33.8 g/dL (ref 30.0–36.0)
MCV: 94.1 fL (ref 80.0–100.0)
Platelets: 229 10*3/uL (ref 150–400)
RBC: 3.93 MIL/uL — ABNORMAL LOW (ref 4.22–5.81)
RDW: 15.2 % (ref 11.5–15.5)
WBC: 10 10*3/uL (ref 4.0–10.5)
nRBC: 0 % (ref 0.0–0.2)

## 2024-04-21 LAB — SARS CORONAVIRUS 2 BY RT PCR: SARS Coronavirus 2 by RT PCR: NEGATIVE

## 2024-04-21 MED ORDER — AMLODIPINE BESYLATE 5 MG PO TABS
10.0000 mg | ORAL_TABLET | Freq: Every day | ORAL | Status: DC
  Administered 2024-04-22: 10 mg via ORAL
  Filled 2024-04-21: qty 2

## 2024-04-21 MED ORDER — OLANZAPINE 5 MG PO TBDP: 5.0000 mg | ORAL_TABLET | Freq: Three times a day (TID) | ORAL | Status: DC | PRN

## 2024-04-21 MED ORDER — ALUM & MAG HYDROXIDE-SIMETH 200-200-20 MG/5ML PO SUSP: 30.0000 mL | ORAL | Status: DC | PRN

## 2024-04-21 MED ORDER — LIDOCAINE 5 % EX PTCH
2.0000 | MEDICATED_PATCH | CUTANEOUS | Status: DC
  Administered 2024-04-22 – 2024-04-26 (×5): 2 via TRANSDERMAL
  Filled 2024-04-21 (×5): qty 2

## 2024-04-21 MED ORDER — ACETAMINOPHEN 325 MG PO TABS: 650.0000 mg | ORAL_TABLET | Freq: Four times a day (QID) | ORAL | Status: DC | PRN

## 2024-04-21 MED ORDER — HYDRALAZINE HCL 25 MG PO TABS
25.0000 mg | ORAL_TABLET | Freq: Two times a day (BID) | ORAL | Status: DC
  Administered 2024-04-21: 25 mg via ORAL
  Filled 2024-04-21: qty 1

## 2024-04-21 MED ORDER — HYDRALAZINE HCL 25 MG PO TABS: 25.0000 mg | ORAL_TABLET | Freq: Two times a day (BID) | ORAL | Status: DC

## 2024-04-21 MED ORDER — NICOTINE 21 MG/24HR TD PT24
21.0000 mg | MEDICATED_PATCH | Freq: Every day | TRANSDERMAL | Status: DC
  Administered 2024-04-22 – 2024-04-26 (×5): 21 mg via TRANSDERMAL
  Filled 2024-04-21 (×5): qty 1

## 2024-04-21 MED ORDER — GABAPENTIN 300 MG PO CAPS
300.0000 mg | ORAL_CAPSULE | Freq: Every day | ORAL | Status: DC
  Administered 2024-04-21 – 2024-04-25 (×5): 300 mg via ORAL
  Filled 2024-04-21 (×5): qty 1

## 2024-04-21 MED ORDER — MELATONIN 5 MG PO TABS
2.5000 mg | ORAL_TABLET | Freq: Every day | ORAL | Status: DC
  Administered 2024-04-21 – 2024-04-25 (×5): 2.5 mg via ORAL
  Filled 2024-04-21 (×6): qty 0.5

## 2024-04-21 MED ORDER — INSULIN ASPART 100 UNIT/ML IJ SOLN
0.0000 [IU] | Freq: Three times a day (TID) | INTRAMUSCULAR | Status: DC
  Administered 2024-04-22 – 2024-04-25 (×6): 1 [IU] via SUBCUTANEOUS
  Administered 2024-04-26: 2 [IU] via SUBCUTANEOUS
  Filled 2024-04-21 (×3): qty 1

## 2024-04-21 MED ORDER — MAGNESIUM HYDROXIDE 400 MG/5ML PO SUSP: 30.0000 mL | Freq: Every day | ORAL | Status: DC | PRN

## 2024-04-21 MED ORDER — INSULIN ASPART 100 UNIT/ML IJ SOLN: 0.0000 [IU] | Freq: Every day | INTRAMUSCULAR | Status: DC

## 2024-04-21 MED ORDER — CLONIDINE HCL 0.3 MG/24HR TD PTWK: 0.3000 mg | MEDICATED_PATCH | TRANSDERMAL | Status: DC

## 2024-04-21 MED ORDER — ASPIRIN 325 MG PO TABS
325.0000 mg | ORAL_TABLET | Freq: Every day | ORAL | Status: DC
  Administered 2024-04-22 – 2024-04-26 (×5): 325 mg via ORAL
  Filled 2024-04-21 (×5): qty 1

## 2024-04-21 MED ORDER — OLANZAPINE 10 MG IM SOLR: 5.0000 mg | Freq: Three times a day (TID) | INTRAMUSCULAR | Status: DC | PRN

## 2024-04-21 MED ORDER — LIDOCAINE 5 % EX PTCH: 2.0000 | MEDICATED_PATCH | CUTANEOUS | Status: DC

## 2024-04-21 MED ORDER — PANTOPRAZOLE SODIUM 40 MG PO TBEC
40.0000 mg | DELAYED_RELEASE_TABLET | Freq: Every day | ORAL | Status: DC
  Administered 2024-04-21 – 2024-04-25 (×5): 40 mg via ORAL
  Filled 2024-04-21 (×5): qty 1

## 2024-04-21 MED ORDER — LABETALOL HCL 200 MG PO TABS
300.0000 mg | ORAL_TABLET | Freq: Two times a day (BID) | ORAL | Status: DC
  Administered 2024-04-21 – 2024-04-26 (×10): 300 mg via ORAL
  Filled 2024-04-21 (×11): qty 1

## 2024-04-21 MED ORDER — ATORVASTATIN CALCIUM 80 MG PO TABS
80.0000 mg | ORAL_TABLET | Freq: Every day | ORAL | Status: DC
  Administered 2024-04-21: 80 mg via ORAL
  Filled 2024-04-21: qty 1

## 2024-04-21 MED ORDER — MENTHOL 3 MG MT LOZG
1.0000 | LOZENGE | OROMUCOSAL | Status: DC | PRN
  Administered 2024-04-21 – 2024-04-26 (×20): 3 mg via ORAL
  Filled 2024-04-21 (×4): qty 9

## 2024-04-21 MED ORDER — HYDRALAZINE HCL 25 MG PO TABS
25.0000 mg | ORAL_TABLET | Freq: Two times a day (BID) | ORAL | Status: DC
  Administered 2024-04-21 – 2024-04-22 (×2): 25 mg via ORAL
  Filled 2024-04-21 (×2): qty 1

## 2024-04-21 NOTE — Tx Team (Signed)
 Initial Treatment Plan 04/21/2024 6:58 PM BRITAIN ELLENA ZOX:[id number]    PATIENT STRESSORS: Marital or family conflict     PATIENT STRENGTHS: Religious Affiliation    PATIENT IDENTIFIED PROBLEMS:   "I am here to get help."                   DISCHARGE CRITERIA:  Ability to meet basic life and health needs Adequate post-discharge living arrangements Improved stabilization in mood, thinking, and/or behavior Safe-care adequate arrangements made Verbal commitment to aftercare and medication compliance  PRELIMINARY DISCHARGE PLAN: Placement in alternative living arrangements  PATIENT/FAMILY INVOLVEMENT: This treatment plan has been presented to and reviewed with the patient, Mccrae Chann. The patient has been given the opportunity to ask questions and make suggestions.  Lestine Rathke, RN 04/21/2024, 6:58 PM

## 2024-04-21 NOTE — Discharge Summary (Signed)
 Physician Discharge Summary   Patient: Taylor Obrien MRN: [id number] DOB: 07/16/1966  Admit date:     04/19/2024  Discharge date: 04/21/24  Discharge Physician: Myrtie Atkinson Duayne Brideau   PCP: Sena Dam, NP   Recommendations at discharge:   Please follow up with primary care provider within 1-2 weeks  Please repeat BMP and CBC in one week    Hospital Course: 58 y.o. male with medical history significant for hypertension, alcoholic pancreatitis, AKI on CKD, CVA, CKD stage III, diabetes mellitus, hypertension, OSA.  Patient presented to the ED reporting that he took a lot of Benadryl  earlier.  He tells me he took maybe 10 to 12 tablets of Benadryl  which he bought at [employer]".  They were in the pack, and he removed each pill.  He denies taking any other medications.  He reports he has not taken his medications in about 2 weeks.  His wife usually helps him with his medication, but she left about 2 weeks ago.  Reports his wife was upset with him because he told the daughter (Who is a nurse)to leave.  Ambulates with a wheelchair.  He has been able to fix food for himself.  Denies feeling different after taking the Benadryl  today, he is at his baseline.   ED Course: Temperature 101.  Heart rate 80-111.  Respiratory rate 16-23.  Blood pressure elevated up to 204/125.  O2 sats greater than 94% on room air. UA unremarkable.  UDS positive for THC.  Blood alcohol and Tylenol  level unremarkable.   EDP: Contacted Poison control, recommended checking EKG in 4 hours, check CK, CMP, magnesium, benzos for agitation/tachycardia.  On the Tylenol  typically does not work for fever due to Benadryl , puts on fans and cooling cloth. Patient IVCD in the ED.  50g Activated charcoal given.  5mg  and then 10mg  labetalol  given. [name] clonidine  patch also resumed. EKG showed initial QRS of 102 >> repeat QRS 107 >> 111.  Hospitalization requested.   Assessment and Plan: Intentional diphenhydramine  overdose  Patient calm, not  agitated, febrile to 101- Anticholinergic effect of Benadryl .  He tells me he took 10-12 Benadryl .  Denies co-ingestants. UDS- positive for THC.  Intentional overdose- because his of 20+ yrs wife left him.  Has not taken any of his medications in the past 2 weeks.  Initial EKG shows QRS of 102, QTc 456, total EKG shows QRS of 102, QTc of 473. - EDP Contacted Poison control, recommended checking EKG in 4 hours, check CK, CMP, magnesium, benzos for agitation/tachycardia.  On the Tylenol  typically does not work for fever due to Benadryl , puts on fans and cooling cloth. - 50mg  activated charcoal given in ED - CK, magnesium, Tylenol , alcohol levels unremarkable - repeat EKG 5/13 is reassuring - Pt received IV fluid hydration - Medically cleared by MD 5/13 to speak with TTS for formal psychiatric evaluation and disposition determination -TTS recommended inpatient [facility] -04/21/24--pt remains medically stable for d/c   Chronic kidney disease, stage 3b  - baseline creatinine 1.7-2.0 - stable   Hypertensive urgency Blood pressure elevated up to 204/125, now 160s to 170s.  He had his clonidine  patch on him, but he has not taken any of his other antihypertensives in 2 weeks.  - Resume clonidine  0.3mg  patch - As needed labetalol  for systolic greater than 180 - Resumed home dose amlodipine , labetalol  - hydralazine  25 mg bid started - lisinopril  discontinued   Diabetes mellitus, type 2, controlled, not on insulin  01/30/2024- HgbA1c- 6.7.  - SSI- S -  Hold Jardiance for now>>restart after d/c     Consultants: TTS Procedures performed: none  Disposition: [facility] Diet recommendation:  Carb modified diet DISCHARGE MEDICATION: Allergies as of 04/21/2024       Reactions   Ibuprofen  Other (See Comments)   Avoid per PCP   Oxcarbazepine Other (See Comments)   Patient goes out of right state of mind.         Medication List     STOP taking these medications    diphenhydrAMINE  25 mg capsule Commonly  known as: BENADRYL    diphenhydramine -acetaminophen  25-500 MG Tabs tablet Commonly known as: TYLENOL  PM   escitalopram  20 MG tablet Commonly known as: LEXAPRO    gabapentin  300 MG capsule Commonly known as: NEURONTIN    hydrOXYzine  25 MG tablet Commonly known as: ATARAX    ibuprofen  200 MG tablet Commonly known as: ADVIL    lisinopril  20 MG tablet Commonly known as: ZESTRIL    oxyCODONE  5 MG immediate release tablet Commonly known as: Oxy IR/ROXICODONE    traZODone  50 MG tablet Commonly known as: DESYREL        TAKE these medications    amLODipine  10 MG tablet Commonly known as: NORVASC  Take 1 tablet (10 mg total) by mouth daily.   aspirin  325 MG tablet Take 1 tablet (325 mg total) by mouth daily.   atorvastatin  80 MG tablet Commonly known as: LIPITOR Take 80 mg by mouth at bedtime.   cloNIDine  0.3 mg/24hr patch Commonly known as: CATAPRES  - Dosed in mg/24 hr Place 1 patch (0.3 mg total) onto the skin once a week. Next change on monday   empagliflozin 25 MG Tabs tablet Commonly known as: JARDIANCE Take 25 mg by mouth daily.   fenofibrate  145 MG tablet Commonly known as: TRICOR  Take 145 mg by mouth daily.   hydrALAZINE  25 MG tablet Commonly known as: APRESOLINE  Take 1 tablet (25 mg total) by mouth 2 (two) times daily.   labetalol  300 MG tablet Commonly known as: NORMODYNE  Take 300 mg by mouth 2 (two) times daily.   lidocaine  5 % Commonly known as: LIDODERM  Place 2 patches onto the skin daily. Remove & Discard patch within 12 hours or as directed by MD   pantoprazole  40 MG tablet Commonly known as: PROTONIX  Take 1 tablet (40 mg total) by mouth at bedtime.   Vascepa  1 g capsule Generic drug: icosapent  Ethyl Take 2 g by mouth 2 (two) times daily.        Discharge Exam: Filed Weights   04/19/24 1519 04/19/24 2323  Weight: 87.2 kg 80.5 kg   HEENT:  [place]/AT, No thrush, no icterus CV:  RRR, no rub, no S3, no S4 Lung:  CTA, no wheeze, no rhonchi Abd:   soft/+BS, NT Ext:  No edema, no lymphangitis, no synovitis, no rash  Condition at discharge: serious  The results of significant diagnostics from this hospitalization (including imaging, microbiology, ancillary and laboratory) are listed below for reference.   Imaging Studies: No results found.  Microbiology: Results for orders placed or performed during the hospital encounter of 01/29/24  Resp panel by RT-PCR (RSV, Flu A&B, Covid) Anterior Nasal Swab     Status: None   Collection Time: 01/29/24  9:41 PM   Specimen: Anterior Nasal Swab  Result Value Ref Range Status   SARS Coronavirus 2 by RT PCR NEGATIVE NEGATIVE Final    Comment: (NOTE) SARS-CoV-2 target nucleic acids are NOT DETECTED.  The SARS-CoV-2 RNA is generally detectable in upper respiratory specimens during the acute phase of infection. The  lowest concentration of SARS-CoV-2 viral copies this assay can detect is 138 copies/mL. A negative result does not preclude SARS-Cov-2 infection and should not be used as the sole basis for treatment or other patient management decisions. A negative result may occur with  improper specimen collection/handling, submission of specimen other than nasopharyngeal swab, presence of viral mutation(s) within the areas targeted by this assay, and inadequate number of viral copies(<138 copies/mL). A negative result must be combined with clinical observations, patient history, and epidemiological information. The expected result is Negative.  Fact Sheet for Patients:  BloggerCourse.com  Fact Sheet for Healthcare Providers:  SeriousBroker.it  This test is no t yet approved or cleared by the [place]  FDA and  has been authorized for detection and/or diagnosis of SARS-CoV-2 by FDA under an Emergency Use Authorization (EUA). This EUA will remain  in effect (meaning this test can be used) for the duration of the COVID-19 declaration under  Section 564(b)(1) of the Act, 21 U.S.C.section 360bbb-3(b)(1), unless the authorization is terminated  or revoked sooner.       Influenza A by PCR NEGATIVE NEGATIVE Final   Influenza B by PCR NEGATIVE NEGATIVE Final    Comment: (NOTE) The Xpert Xpress SARS-CoV-2/FLU/RSV plus assay is intended as an aid in the diagnosis of influenza from Nasopharyngeal swab specimens and should not be used as a sole basis for treatment. Nasal washings and aspirates are unacceptable for Xpert Xpress SARS-CoV-2/FLU/RSV testing.  Fact Sheet for Patients: BloggerCourse.com  Fact Sheet for Healthcare Providers: SeriousBroker.it  This test is not yet approved or cleared by the [place]  FDA and has been authorized for detection and/or diagnosis of SARS-CoV-2 by FDA under an Emergency Use Authorization (EUA). This EUA will remain in effect (meaning this test can be used) for the duration of the COVID-19 declaration under Section 564(b)(1) of the Act, 21 U.S.C. section 360bbb-3(b)(1), unless the authorization is terminated or revoked.     Resp Syncytial Virus by PCR NEGATIVE NEGATIVE Final    Comment: (NOTE) Fact Sheet for Patients: BloggerCourse.com  Fact Sheet for Healthcare Providers: SeriousBroker.it  This test is not yet approved or cleared by the [place]  FDA and has been authorized for detection and/or diagnosis of SARS-CoV-2 by FDA under an Emergency Use Authorization (EUA). This EUA will remain in effect (meaning this test can be used) for the duration of the COVID-19 declaration under Section 564(b)(1) of the Act, 21 U.S.C. section 360bbb-3(b)(1), unless the authorization is terminated or revoked.  Performed at [facility], [address] [place], [place] [address]     Labs: CBC: Recent Labs  Lab 04/19/24 1553 04/20/24 0427 04/21/24 0418  WBC 8.4 8.0 10.0  NEUTROABS  6.6  --   --   HGB 14.5 12.2* 12.5*  HCT 43.2 36.8* 37.0*  MCV 94.1 94.6 94.1  PLT 252 210 229   Basic Metabolic Panel: Recent Labs  Lab 04/19/24 1553 04/20/24 0427 04/21/24 0418  NA 137 139 137  K 4.2 4.1 3.7  CL 105 106 108  CO2 21* 22 23  GLUCOSE 127* 119* 115*  BUN 14 15 12   CREATININE 1.73* 1.90* 1.92*  CALCIUM  9.2 8.4* 8.3*  MG 1.9 1.7  --   PHOS  --  3.8  --    Liver Function Tests: Recent Labs  Lab 04/19/24 1553 04/20/24 0427 04/21/24 0418  AST 26 20 18   ALT 15 12 10   ALKPHOS 72 49 54  BILITOT 0.5 0.3 0.3  PROT 7.2 5.3* 5.6*  ALBUMIN 3.3* 2.4* 2.6*   CBG: Recent Labs  Lab 04/20/24 0719 04/20/24 1157 04/20/24 1612 04/20/24 1948 04/21/24 0737  GLUCAP 130* 106* 156* 128* 126*    Discharge time spent: greater than 30 minutes.  Signed: Demaris Fillers, MD [name] 04/21/2024

## 2024-04-21 NOTE — Progress Notes (Signed)
 Report called to [facility] unit, patient transported via safe transport in stable condition.

## 2024-04-21 NOTE — Plan of Care (Signed)
  Problem: Education: Goal: Utilization of techniques to improve thought processes will improve Outcome: Progressing Goal: Knowledge of the prescribed therapeutic regimen will improve Outcome: Progressing   Problem: Activity: Goal: Interest or engagement in leisure activities will improve Outcome: Progressing Goal: Imbalance in normal sleep/wake cycle will improve Outcome: Progressing   

## 2024-04-21 NOTE — Progress Notes (Signed)
 Pt was accepted to [facility] 04/21/2024  Bed Assignment L26   Address: [address] [place], [place], [place] [address]  [facility] Lucetta Russel Fax: [phone]  Pt meets inpatient criteria per   Attending Physician will be Dr. Romona Cobb  Report can be called to: [phone]  Pt can arrive after COVID/ CONSENTS   Care Team notified:Linsey Strader RN, Heyward Loud NP, Ellsworth Haas RN, Orelia Binet RN, Bascom Lily RN     [place] Calise Dunckel MSW, [facility] 04/21/2024 11:51 AM

## 2024-04-21 NOTE — Progress Notes (Addendum)
   04/21/24 1914  Psych Admission Type (Psych Patients Only)  Admission Status Involuntary  Psychosocial Assessment  Patient Complaints Anxiety;Depression;Substance abuse;Worrying  Eye Contact Brief  Facial Expression Animated  Affect Anxious;Depressed  Speech Soft  Interaction Assertive  Motor Activity Unsteady  Appearance/Hygiene Unremarkable  Mood Depressed;Anxious;Pleasant  Thought Process  Coherency Disorganized  Content Blaming others;Preoccupation  Delusions None reported or observed  Perception WDL  Hallucination None reported or observed  Judgment Impaired  Confusion None  Danger to Self  Current suicidal ideation? Denies  Danger to Others  Danger to Others None reported or observed    Patient presents following an intentional overdose of approximately 10 Ambien pills. He reports emotional distress related to his wife leaving him after a 20-year marriage, which was to be celebrated with an anniversary the following day. The patient has a history of stroke (2016), alcohol use disorder, and marijuana use. He was previously admitted to [facility] for substance use. He endorses feelings of depression and anxiety, rates his depression as 5/10 and his anxiety as unquantified but present. Denies current suicidal or homicidal ideation but admitted to prior thoughts and intense emotional pain. He reports a fall from his wheelchair 3 weeks ago. Also endorses visual disturbances ("white dots") and left-sided pain. Patient is wheelchair bound and able to make needs known and do ADLs. Patient states he masturbates twice a week and he will like to be accommodated. Patient states his goals while here is to get a place to stay before he leaves. He states he has no support system except his pastor.

## 2024-04-22 ENCOUNTER — Encounter: Payer: Self-pay | Admitting: Psychiatry

## 2024-04-22 DIAGNOSIS — F332 Major depressive disorder, recurrent severe without psychotic features: Secondary | ICD-10-CM | POA: Diagnosis not present

## 2024-04-22 LAB — GLUCOSE, CAPILLARY
Glucose-Capillary: 130 mg/dL — ABNORMAL HIGH (ref 70–99)
Glucose-Capillary: 120 mg/dL — ABNORMAL HIGH (ref 70–99)
Glucose-Capillary: 100 mg/dL — ABNORMAL HIGH (ref 70–99)
Glucose-Capillary: 133 mg/dL — ABNORMAL HIGH (ref 70–99)

## 2024-04-22 MED ORDER — AMLODIPINE BESYLATE 5 MG PO TABS
10.0000 mg | ORAL_TABLET | Freq: Every day | ORAL | Status: DC
Start: 2024-04-23 — End: 2024-04-26
  Administered 2024-04-23 – 2024-04-26 (×4): 10 mg via ORAL
  Filled 2024-04-22 (×4): qty 2

## 2024-04-22 MED ORDER — LOPERAMIDE HCL 2 MG PO CAPS: 2.0000 mg | ORAL_CAPSULE | ORAL | Status: AC | PRN

## 2024-04-22 MED ORDER — ATORVASTATIN CALCIUM 80 MG PO TABS
80.0000 mg | ORAL_TABLET | Freq: Every day | ORAL | Status: DC
  Administered 2024-04-22 – 2024-04-26 (×5): 80 mg via ORAL
  Filled 2024-04-22 (×5): qty 1

## 2024-04-22 MED ORDER — PANTOPRAZOLE SODIUM 40 MG PO TBEC
40.0000 mg | DELAYED_RELEASE_TABLET | Freq: Every day | ORAL | Status: DC
Start: 2024-04-22 — End: 2024-04-22

## 2024-04-22 MED ORDER — OXYCODONE HCL 5 MG PO TABS
5.0000 mg | ORAL_TABLET | Freq: Three times a day (TID) | ORAL | Status: DC | PRN
  Administered 2024-04-22 – 2024-04-26 (×9): 5 mg via ORAL
  Filled 2024-04-22 (×9): qty 1

## 2024-04-22 MED ORDER — HYDROXYZINE HCL 25 MG PO TABS: 25.0000 mg | ORAL_TABLET | Freq: Four times a day (QID) | ORAL | Status: AC | PRN

## 2024-04-22 MED ORDER — CHLORDIAZEPOXIDE HCL 25 MG PO CAPS: 25.0000 mg | ORAL_CAPSULE | Freq: Four times a day (QID) | ORAL | Status: AC | PRN

## 2024-04-22 MED ORDER — ESCITALOPRAM OXALATE 10 MG PO TABS
5.0000 mg | ORAL_TABLET | Freq: Every day | ORAL | Status: DC
  Administered 2024-04-22 – 2024-04-23 (×2): 5 mg via ORAL
  Filled 2024-04-22 (×2): qty 1

## 2024-04-22 MED ORDER — ADULT MULTIVITAMIN W/MINERALS CH
1.0000 | ORAL_TABLET | Freq: Every day | ORAL | Status: DC
  Administered 2024-04-23 – 2024-04-26 (×4): 1 via ORAL
  Filled 2024-04-22 (×4): qty 1

## 2024-04-22 MED ORDER — HYDRALAZINE HCL 25 MG PO TABS
25.0000 mg | ORAL_TABLET | Freq: Two times a day (BID) | ORAL | Status: DC
  Administered 2024-04-22 – 2024-04-26 (×8): 25 mg via ORAL
  Filled 2024-04-22 (×8): qty 1

## 2024-04-22 MED ORDER — THIAMINE HCL 100 MG/ML IJ SOLN
100.0000 mg | Freq: Once | INTRAMUSCULAR | Status: AC
  Administered 2024-04-22: 100 mg via INTRAMUSCULAR
  Filled 2024-04-22: qty 2

## 2024-04-22 MED ORDER — ONDANSETRON 4 MG PO TBDP: 4.0000 mg | ORAL_TABLET | Freq: Four times a day (QID) | ORAL | Status: AC | PRN

## 2024-04-22 MED ORDER — LABETALOL HCL 200 MG PO TABS
300.0000 mg | ORAL_TABLET | Freq: Two times a day (BID) | ORAL | Status: DC
Start: 2024-04-22 — End: 2024-04-22
  Filled 2024-04-22: qty 1

## 2024-04-22 MED ORDER — CLONIDINE HCL 0.3 MG/24HR TD PTWK
0.3000 mg | MEDICATED_PATCH | TRANSDERMAL | Status: DC
  Administered 2024-04-22 – 2024-04-23 (×2): 0.3 mg via TRANSDERMAL
  Filled 2024-04-22 (×2): qty 1

## 2024-04-22 NOTE — Progress Notes (Signed)
   04/21/24 2115  Psych Admission Type (Psych Patients Only)  Admission Status Involuntary  Psychosocial Assessment  Patient Complaints Anxiety;Depression;Self-harm thoughts;Substance abuse  Eye Contact Brief  Facial Expression Animated  Affect Anxious;Depressed  Speech Soft  Interaction Assertive  Motor Activity Unsteady  Appearance/Hygiene Unremarkable  Behavior Characteristics Appropriate to situation  Mood Depressed;Anxious;Pleasant  Aggressive Behavior  Effect No apparent injury  Thought Process  Coherency Disorganized  Content Blaming others  Delusions None reported or observed  Perception WDL  Hallucination None reported or observed  Judgment Impaired  Confusion None  Danger to Self  Current suicidal ideation? Denies  Danger to Others  Danger to Others None reported or observed

## 2024-04-22 NOTE — Plan of Care (Signed)

## 2024-04-22 NOTE — Group Note (Signed)
 Recreation Therapy Group Note   Group Topic:Relaxation  Group Date: 04/22/2024 Start Time: 1100 End Time: 1140 Facilitators: Deatrice Factor, LRT, CTRS Location: Dayroom  Group Description: PMR (Progressive Muscle Relaxation). LRT educates patients on what PMR is and the benefits that come from it. Patients are asked to sit with their feet flat on the floor while sitting up and all the way back in their chair, if possible. LRT and pts follow a prompt through a speaker that requires you to tense and release different muscles in their body and focus on their breathing. During session, lights are off and soft music is being played. Pts are given a stress ball to use if needed.   Goal Area(s) Addressed:  Patients will be able to describe progressive muscle relaxation.  Patient will practice using relaxation technique. Patient will identify a new coping skill.  Patient will follow multistep directions to reduce anxiety and stress.   Affect/Mood: Appropriate   Participation Level: Active and Engaged   Participation Quality: Independent   Behavior: Calm and Cooperative   Speech/Thought Process: Coherent   Insight: Good   Judgement: Good   Modes of Intervention: Education and Exploration   Patient Response to Interventions:  Attentive, Engaged, and Receptive   Education Outcome:  Acknowledges education   Clinical Observations/Individualized Feedback: Taylor Obrien was active in their participation of session activities and group discussion. Pt identified that this helped him calm down "some" as he was able to tense and release the muscles on the right side of his body but not the left. Pt interacted well with LRT and peers duration of session.   Plan: Continue to engage patient in RT group sessions 2-3x/week.   Deatrice Factor, LRT, CTRS 04/22/2024 1:34 PM

## 2024-04-22 NOTE — Progress Notes (Signed)
   04/22/24 0604  15 Minute Checks  Location Hallway  Visual Appearance Calm  Behavior Composed  Sleep (Behavioral Health Patients Only)  Calculate sleep? (Click Yes once per 24 hr at 0600 safety check) Yes  Documented sleep last 24 hours 6.75

## 2024-04-22 NOTE — [facility] Suicide Risk Assessment (Signed)
 [facility] INPATIENT:  Family/Significant Other Suicide Prevention Education  Suicide Prevention Education:  Contact Attempts: Layken Calabria, wife, [phone], (name of family member/significant other) has been identified by the patient as the family member/significant other with whom the patient will be residing, and identified as the person(s) who will aid the patient in the event of a mental health crisis.  With written consent from the patient, two attempts were made to provide suicide prevention education, prior to and/or following the patient's discharge.  We were unsuccessful in providing suicide prevention education.  A suicide education pamphlet was given to the patient to share with family/significant other.  Date and time of first attempt: 04/22/2024 at 2:05 PM Date and time of second attempt: Second attempt is needed.  CSW unable to leave HIPAA compliant voicemail.  Call rang without answer or VM.  Larri Ply 04/22/2024, 2:05 PM

## 2024-04-22 NOTE — Plan of Care (Signed)
 Patient up rolling around in wheelchair. Complaints of restless legs and not able to rest well through the night.  Respirations equal and unlabored, skin warm and dry, NAD. No change in assessment or acuity. Q15 min safety checks conducted/ongoing. Will continue to monitor for safety.   Problem: Education: Goal: Utilization of techniques to improve thought processes will improve Outcome: Not Progressing Goal: Knowledge of the prescribed therapeutic regimen will improve Outcome: Not Progressing   Problem: Activity: Goal: Interest or engagement in leisure activities will improve Outcome: Progressing Goal: Imbalance in normal sleep/wake cycle will improve Outcome: Progressing   Problem: Health Behavior/Discharge Planning: Goal: Ability to make decisions will improve Outcome: Not Progressing Goal: Compliance with therapeutic regimen will improve Outcome: Not Progressing   Problem: Safety: Goal: Ability to disclose and discuss suicidal ideas will improve Outcome: Progressing Goal: Ability to identify and utilize support systems that promote safety will improve Outcome: Progressing

## 2024-04-22 NOTE — [facility] Suicide Risk Assessment (Signed)
 [facility] Admission Suicide Risk Assessment   Nursing information obtained from:  Patient Demographic factors:  NA Current Mental Status:  Suicidal ideation indicated by patient, Plan to harm others Loss Factors:  Decline in physical health Historical Factors:  Prior suicide attempts, Impulsivity Risk Reduction Factors:  Positive coping skills or problem solving skills  Total Time spent with patient: 30 minutes Principal Problem: MDD (major depressive disorder), recurrent severe, without psychosis (HCC) Diagnosis:  Principal Problem:   MDD (major depressive disorder), recurrent severe, without psychosis (HCC)  Subjective Data: Taylor Obrien is a 58 year old African-American male with history of depression, chronic alcohol use presented to the emergency room on IVC after reporting ingesting 10 to 12 pills of Benadryl  as a suicide attempt, in the context of his wife of 20 years leaving him.  Patient is currently admitted to the geropsych unit for further stabilization  Continued Clinical Symptoms:  Alcohol Use Disorder Identification Test Final Score (AUDIT): 29 The "Alcohol Use Disorders Identification Test", Guidelines for Use in Primary Care, Second Edition.  World Science writer [facility]). Score between 0-7:  no or low risk or alcohol related problems. Score between 8-15:  moderate risk of alcohol related problems. Score between 16-19:  high risk of alcohol related problems. Score 20 or above:  warrants further diagnostic evaluation for alcohol dependence and treatment.   CLINICAL FACTORS:   Depression:   Comorbid alcohol abuse/dependence   Musculoskeletal: Strength & Muscle Tone: within normal limits Gait & Station: unsteady Patient leans: N/A  Psychiatric Specialty Exam:  Presentation  General Appearance:  Appropriate for Environment; Casual  Eye Contact: Fair  Speech: Clear and Coherent  Speech Volume: Normal  Handedness: Right   Mood and Affect   Mood: Dysphoric  Affect: Appropriate   Thought Process  Thought Processes: Coherent  Descriptions of Associations:Intact  Orientation:Full (Time, Place and Person)  Thought Content:Logical  History of Schizophrenia/Schizoaffective disorder:No  Duration of Psychotic Symptoms:No data recorded Hallucinations:Hallucinations: None  Ideas of Reference:None  Suicidal Thoughts:Suicidal Thoughts: Yes, Passive SI Passive Intent and/or Plan: Without Intent; Without Plan  Homicidal Thoughts:Homicidal Thoughts: No   Sensorium  Memory: Immediate Fair; Recent Fair; Remote Fair  Judgment: Impaired  Insight: Shallow   Executive Functions  Concentration: Fair  Attention Span: Fair  Recall: [employer] of Knowledge: Fair  Language: Fair   Psychomotor Activity  Psychomotor Activity: Psychomotor Activity: Normal   Assets  Assets: Communication Skills; Desire for Improvement; Physical Health; Resilience   Sleep  Sleep: Sleep: Fair    Physical Exam: Physical Exam ROS Blood pressure (!) 145/91, pulse 83, temperature (!) 97.4 F (36.3 C), resp. rate 18, height 5\' 6"  (1.676 m), weight 99.8 kg, SpO2 100%. Body mass index is 35.51 kg/m.   COGNITIVE FEATURES THAT CONTRIBUTE TO RISK:  None    SUICIDE RISK:   Mild:  Suicidal ideation of limited frequency, intensity, duration, and specificity.  There are no identifiable plans, no associated intent, mild dysphoria and related symptoms, good self-control (both objective and subjective assessment), few other risk factors, and identifiable protective factors, including available and accessible social support.  PLAN OF CARE: Patient is admitted to [facility] psych unit with Q15 min safety monitoring. Multidisciplinary team approach is offered. Medication management; group/milieu therapy is offered.   I certify that inpatient services furnished can reasonably be expected to improve the patient's condition.   Aurelia Blotter, MD 04/22/2024, 2:45 PM
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
## 2024-04-22 NOTE — Plan of Care (Signed)
°  Problem: Education: °Goal: Utilization of techniques to improve thought processes will improve °Outcome: Progressing °Goal: Knowledge of the prescribed therapeutic regimen will improve °Outcome: Progressing °  °Problem: Activity: °Goal: Interest or engagement in leisure activities will improve °Outcome: Progressing °Goal: Imbalance in normal sleep/wake cycle will improve °Outcome: Progressing °  °Problem: Coping: °Goal: Coping ability will improve °Outcome: Progressing °Goal: Will verbalize feelings °Outcome: Progressing °  °Problem: Health Behavior/Discharge Planning: °Goal: Ability to make decisions will improve °Outcome: Progressing °Goal: Compliance with therapeutic regimen will improve °Outcome: Progressing °  °Problem: Role Relationship: °Goal: Will demonstrate positive changes in social behaviors and relationships °Outcome: Progressing °  °Problem: Safety: °Goal: Ability to disclose and discuss suicidal ideas will improve °Outcome: Progressing °Goal: Ability to identify and utilize support systems that promote safety will improve °Outcome: Progressing °  °Problem: Self-Concept: °Goal: Will verbalize positive feelings about self °Outcome: Progressing °Goal: Level of anxiety will decrease °Outcome: Progressing °  °Problem: Education: °Goal: Ability to state activities that reduce stress will improve °Outcome: Progressing °  °Problem: Coping: °Goal: Ability to identify and develop effective coping behavior will improve °Outcome: Progressing °  °Problem: Self-Concept: °Goal: Ability to identify factors that promote anxiety will improve °Outcome: Progressing °Goal: Level of anxiety will decrease °Outcome: Progressing °Goal: Ability to modify response to factors that promote anxiety will improve °Outcome: Progressing °  °
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
## 2024-04-22 NOTE — Progress Notes (Signed)
   04/22/24 1530  Spiritual Encounters  Type of Visit Initial  Care provided to: Patient  Conversation partners present during encounter Social worker/Care management/TOC  Reason for visit Routine spiritual support  OnCall Visit No   Chaplain met patient and introduced herself and Dispensing optician.  Patient was happy with the music that the staff was playing outside.  Patient shared a bit about painful familial issues and Chaplain provided empathy and reflective listening.   Patient asked Chaplain to bring fruit.  Chaplain shared that the food service team may be a group who can bring fruit.    Rev. Rana M. Nolon Baxter, M.Div. Chaplain Resident [facility]

## 2024-04-22 NOTE — Group Note (Signed)
 Recreation Therapy Group Note   Group Topic:Stress Management  Group Date: 04/22/2024 Start Time: 1500 End Time: 1550 Facilitators: Deatrice Factor, LRT, CTRS Location: Courtyard  Group Description: Outdoor Recreation. Patients had the option to play corn hole, ring toss, bowling or listening to music while outside in the courtyard getting fresh air and sunlight. Patients helped water  and prune the raised garden beds. LRT and patients discussed things that they enjoy doing in their free time outside of the hospital. LRT encouraged patients to drink water  after being active and getting their heart rate up.   Goal Area(s) Addressed: Patient will identify leisure interests.  Patient will practice healthy decision making. Patient will engage in recreation activity.   Affect/Mood: Appropriate   Participation Level: Active   Participation Quality: Independent   Behavior: Appropriate   Speech/Thought Process: Coherent   Insight: Fair   Judgement: Fair    Modes of Intervention: Activity   Patient Response to Interventions:  Receptive   Education Outcome:  Acknowledges education   Clinical Observations/Individualized Feedback: Gian was active in their participation of session activities and group discussion. Pt interacted well with LRT and peers duration of session.    Plan: Continue to engage patient in RT group sessions 2-3x/week.   Deatrice Factor, LRT, CTRS 04/22/2024 5:04 PM

## 2024-04-22 NOTE — Group Note (Signed)
 Date:  04/22/2024 Time:  9:08 PM  Group Topic/Focus:  Making Healthy Choices:   The focus of this group is to help patients identify negative/unhealthy choices they were using prior to admission and identify positive/healthier coping strategies to replace them upon discharge.    Participation Level:  Active  Participation Quality:  Appropriate  Affect:  Appropriate  Cognitive:  Appropriate  Insight: Appropriate and Good  Engagement in Group:  Engaged  Modes of Intervention:  Discussion  Additional Comments:    Lynette Saras 04/22/2024, 9:08 PM

## 2024-04-23 DIAGNOSIS — F332 Major depressive disorder, recurrent severe without psychotic features: Secondary | ICD-10-CM | POA: Diagnosis not present

## 2024-04-23 LAB — GLUCOSE, CAPILLARY
Glucose-Capillary: 133 mg/dL — ABNORMAL HIGH (ref 70–99)
Glucose-Capillary: 93 mg/dL (ref 70–99)
Glucose-Capillary: 156 mg/dL — ABNORMAL HIGH (ref 70–99)
Glucose-Capillary: 141 mg/dL — ABNORMAL HIGH (ref 70–99)

## 2024-04-23 MED ORDER — ESCITALOPRAM OXALATE 10 MG PO TABS
10.0000 mg | ORAL_TABLET | Freq: Every day | ORAL | Status: DC
  Administered 2024-04-24 – 2024-04-26 (×3): 10 mg via ORAL
  Filled 2024-04-23 (×3): qty 1

## 2024-04-23 NOTE — Group Note (Signed)
 Physical/Occupational Therapy Group Note  Group Topic: UE Therex   Group Date: 04/23/2024 Start Time: 1300 End Time: 1345 Facilitators: Hilma Lucks, OT   Group Description: Group discussed impact of chronic/acute pain on safety and independence with functional tasks and impact on mental health.  Identified and discussed any previously learned or implemented strategies used.  Discussed and reviewed cognitive behavioral pain coping strategies to address/improve overall management of pain. Discussed relaxation, distraction techniques, cognitive restructuring, activity pacing/energy conservation, environment/home safety modifications, and role of sleep and sleep hygiene. Allowed time for questions and further discussion.  Therapeutic Goal(s):  Identify and discuss previously utilized pain coping strategies and implications of pain on function/well-being Identify and discuss implementing new cognitive behavioral pain coping strategies into daily routines Demonstrate understanding and performance of learned cognitive behavioral pain coping strategies  Individual Participation: Pt pleasant and engaged throughout session. Pt interactive with and without prompting. He required min-mod multimodal cues to assist with UE therex technique for approx 75% of the exercises taught with fair carryover. Instructed in modifications for pt reported chronic L shoulder impairments.   Participation Level: Active and Engaged   Participation Quality: Minimal Cues and Moderate Cues   Behavior: Alert, Attentive , Cooperative, Health and safety inspector, and Interactive    Speech/Thought Process: Coherent, Organized, and Relevant   Affect/Mood: Appropriate, Elevated, and Happy   Insight: Fair   Judgement: Fair   Modes of Intervention: Activity, Clarification, Discussion, Education, Problem-solving, and Socialization  Patient Response to Interventions:  Attentive, Engaged, Interested , and Receptive   Plan: Continue to engage  patient in PT/OT groups 1 - 2x/week.   Ryleah Miramontes R., MPH, MS, OTR/L ascom [phone] 04/23/24, 2:18 PM

## 2024-04-23 NOTE — Progress Notes (Signed)
   04/23/24 0628  15 Minute Checks  Location Bedroom  Visual Appearance Calm  Behavior Sleeping  Sleep (Behavioral Health Patients Only)  Calculate sleep? (Click Yes once per 24 hr at 0600 safety check) Yes  Documented sleep last 24 hours 6.75
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
## 2024-04-23 NOTE — Group Note (Signed)
 Date:  04/23/2024 Time:  11:48 PM  Group Topic/Focus:  Developing a Wellness Toolbox:   The focus of this group is to help patients develop a "wellness toolbox" with skills and strategies to promote recovery upon discharge.    Participation Level:  Active  Participation Quality:  Appropriate  Affect:  Appropriate  Cognitive:  Appropriate  Insight: Appropriate  Engagement in Group:  Engaged  Modes of Intervention:  Education  Additional Comments:    [name] 04/23/2024, 11:48 PM

## 2024-04-23 NOTE — Progress Notes (Signed)
 [facility] MD Progress Note  04/23/2024 2:38 PM Taylor Obrien  MRN:  [id number]  Taylor Obrien is a 58 year old African-American male with history of depression, chronic alcohol use presented to the emergency room on IVC after reporting ingesting 10 to 12 pills of Benadryl  as a suicide attempt, in the context of his wife of 20 years leaving him. Patient is currently admitted to the geropsych unit for further stabilization .  Subjective:  Chart reviewed, case discussed in multidisciplinary meeting, patient seen during rounds.  Patient reports being in a good mood.  He reports that he is looking forward to be with his family.  Provider and social worker updated him that his wife is allowing him back but the separation may still happen.  Patient reports that he is very thankful for his family to have him back.  He talks about how he plans on staying sober from alcohol.  He denies SI/HI/plan.  He is taking medications and tolerating them well.  He is agreeable to be monitored for few more days for any side effects to the medication and transition out with outpatient resources.   Sleep: Fair  Appetite:  Fair  Past Psychiatric History: see h&P Family History:  Family History  Problem Relation Age of Onset   Diabetes Mother    Hypertension Mother    Drug abuse Mother    Hyperlipidemia Mother    Diabetes Father    Hypertension Father    Hyperlipidemia Father    Diabetes Brother    Hypertension Brother    Social History:  Social History   Substance and Sexual Activity  Alcohol Use Not Currently   Comment: 2 40 oz daily. 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 Stopped liquor 5 years ago.states no etoh since 03/2022     Social History   Substance and Sexual Activity  Drug Use Yes   Types: Marijuana   Comment: occassionally    Social History   Socioeconomic History   Marital status: Married    Spouse name: Not on file   Number of children: Not on file   Years of education: Not on file   Highest  education level: Not on file  Occupational History   Not on file  Tobacco Use   Smoking status: Every Day    Current packs/day: 0.50    Average packs/day: 0.5 packs/day for 30.0 years (15.0 ttl pk-yrs)    Types: Cigarettes   Smokeless tobacco: Never  Vaping Use   Vaping status: Never Used  Substance and Sexual Activity   Alcohol use: Not Currently    Comment: 2 40 oz daily. 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 Stopped liquor 5 years ago.states no etoh since 03/2022   Drug use: Yes    Types: Marijuana    Comment: occassionally   Sexual activity: Yes    Birth control/protection: None  Other Topics Concern   Not on file  Social History Narrative   Not on file   Social Drivers of Health   Financial Resource Strain: Not on file  Food Insecurity: Food Insecurity Present (04/21/2024)   Hunger Vital Sign    Worried About Running Out of Food in the Last Year: Often true    Ran Out of Food in the Last Year: Often true  Transportation Needs: Unmet Transportation Needs (04/21/2024)   PRAPARE - Administrator, Civil Service (Medical): Yes    Lack of Transportation (Non-Medical): Yes  Physical Activity: Not on file  Stress: Not  on file  Social Connections: Not on file   Past Medical History:  Past Medical History:  Diagnosis Date   Acute alcoholic pancreatitis    Arthritis    Chronic pain    Depression    Diabetes mellitus without complication (HCC)    Essential hypertension, benign    GERD (gastroesophageal reflux disease)    Headache    HTN (hypertension) 11/27/2015   Hyperlipidemia    Sleep apnea    could not tolerate CPAP   Stroke (HCC)    2016    Past Surgical History:  Procedure Laterality Date   BIOPSY  08/19/2022   Procedure: BIOPSY;  Surgeon: Vinetta Greening, DO;  Location: [facility];  Service: Endoscopy;;   CLOSED REDUCTION MANDIBULAR FRACTURE W/ ARCH BARS     + multiple extractions   COLONOSCOPY WITH PROPOFOL  N/A 08/19/2022   Procedure:  COLONOSCOPY WITH PROPOFOL ;  Surgeon: Vinetta Greening, DO;  Location: [facility];  Service: Endoscopy;  Laterality: N/A;  9:15am   Condyloma resection     ESOPHAGOGASTRODUODENOSCOPY (EGD) WITH PROPOFOL  N/A 08/19/2022   Procedure: ESOPHAGOGASTRODUODENOSCOPY (EGD) WITH PROPOFOL ;  Surgeon: Vinetta Greening, DO;  Location: [facility];  Service: Endoscopy;  Laterality: N/A;   MULTIPLE EXTRACTIONS WITH ALVEOLOPLASTY Bilateral 01/23/2018   Procedure: DENTAL EXTRACTION OF TEETH NUMBER ONE, TWO, THREE, FOUR, FIVE, SIX, SEVEN, EIGHT, NINE, TEN, ELEVEN, TWELVE, THIRTEEN, FOURTEEN, FIFTEEN, SIXTEEN, SEVENTEEN, TWENTY, TWENTY-ONE, TWENTY-TWO, TWENTY-THREE, TWENTY-FOUR, TWENTY-FIVE, TWENTY-SIX, TWENTY-SEVEN, TWENTY-EIGHT, TWENTY-NINE, THIRTY-TWO WITH ALVEOLOPLASTY;  Surgeon: Ascencion Lava, DDS;  Location: [facility] OR;  Service: Oral Surgery;  Lat   POLYPECTOMY  08/19/2022   Procedure: POLYPECTOMY;  Surgeon: Vinetta Greening, DO;  Location: [facility];  Service: Endoscopy;;   RADIOLOGY WITH ANESTHESIA N/A 11/18/2015   Procedure: RADIOLOGY WITH ANESTHESIA;  Surgeon: Luellen Sages, MD;  Location: [facility] OR;  Service: Radiology;  Laterality: N/A;   Removal foreign body right shoulder     Right rotator cuff repair     TEE WITHOUT CARDIOVERSION N/A 11/21/2015   Procedure: TRANSESOPHAGEAL ECHOCARDIOGRAM (TEE);  Surgeon: Darlis Eisenmenger, MD;  Location: [facility] ENDOSCOPY;  Service: Cardiovascular;  Laterality: N/A;   TOOTH EXTRACTION N/A 01/24/2018   Procedure: SUTURE ORAL WOUND;  Surgeon: Ascencion Lava, DDS;  Location: [facility] OR;  Service: Oral Surgery;  Laterality: N/A;    Current Medications: Current Facility-Administered Medications  Medication Dose Route Frequency Provider Last Rate Last Admin   acetaminophen  (TYLENOL ) tablet 650 mg  650 mg Oral Q6H PRN Volanda Gruber, NP       alum & mag hydroxide-simeth (MAALOX/MYLANTA) 200-200-20 MG/5ML suspension 30 mL  30 mL Oral Q4H PRN Volanda Gruber, NP       amLODipine   (NORVASC ) tablet 10 mg  10 mg Oral Daily Anacarolina Evelyn, MD   10 mg at 04/23/24 7829   aspirin  tablet 325 mg  325 mg Oral Daily Volanda Gruber, NP   325 mg at 04/23/24 0912   atorvastatin  (LIPITOR) tablet 80 mg  80 mg Oral Daily Starling Jessie, MD   80 mg at 04/23/24 0917   chlordiazePOXIDE (LIBRIUM) capsule 25 mg  25 mg Oral Q6H PRN Jaynee Winters, MD       cloNIDine  (CATAPRES  - Dosed in mg/24 hr) patch 0.3 mg  0.3 mg Transdermal Weekly Wade Asebedo, MD   0.3 mg at 04/22/24 1631   escitalopram  (LEXAPRO ) tablet 5 mg  5 mg Oral Daily Lavontay Kirk, MD   5 mg at 04/23/24 0915  gabapentin  (NEURONTIN ) capsule 300 mg  300 mg Oral QHS Volanda Gruber, NP   300 mg at 04/22/24 2142   hydrALAZINE  (APRESOLINE ) tablet 25 mg  25 mg Oral BID Annabella Elford, MD   25 mg at 04/23/24 9147   hydrOXYzine  (ATARAX ) tablet 25 mg  25 mg Oral Q6H PRN Merranda Bolls, MD       insulin  aspart (novoLOG ) injection 0-5 Units  0-5 Units Subcutaneous QHS Volanda Gruber, NP       insulin  aspart (novoLOG ) injection 0-9 Units  0-9 Units Subcutaneous TID WC Volanda Gruber, NP   1 Units at 04/23/24 1145   labetalol  (NORMODYNE ) tablet 300 mg  300 mg Oral BID Volanda Gruber, NP   300 mg at 04/23/24 0912   lidocaine  (LIDODERM ) 5 % 2 patch  2 patch Transdermal Q24H Volanda Gruber, NP   2 patch at 04/23/24 0917   loperamide (IMODIUM) capsule 2-4 mg  2-4 mg Oral PRN Benny Henrie, MD       magnesium hydroxide (MILK OF MAGNESIA) suspension 30 mL  30 mL Oral Daily PRN Volanda Gruber, NP       melatonin tablet 2.5 mg  2.5 mg Oral QHS Volanda Gruber, NP   2.5 mg at 04/22/24 2141   menthol-cetylpyridinium (CEPACOL) lozenge 3 mg  1 lozenge Oral PRN Onuoha, Chinwendu V, NP   3 mg at 04/23/24 1420   multivitamin with minerals tablet 1 tablet  1 tablet Oral Daily Dore Oquin, MD   1 tablet at 04/23/24 0915   nicotine  (NICODERM CQ  - dosed in mg/24 hours) patch 21 mg  21 mg Transdermal Q0600 Volanda Gruber, NP   21 mg  at 04/23/24 8295   OLANZapine (ZYPREXA) injection 5 mg  5 mg Intramuscular TID PRN Volanda Gruber, NP       OLANZapine zydis (ZYPREXA) disintegrating tablet 5 mg  5 mg Oral TID PRN Volanda Gruber, NP       ondansetron  (ZOFRAN -ODT) disintegrating tablet 4 mg  4 mg Oral Q6H PRN Lamyah Creed, MD       oxyCODONE  (Oxy IR/ROXICODONE ) immediate release tablet 5 mg  5 mg Oral Q8H PRN Talene Glastetter, MD   5 mg at 04/23/24 6213   pantoprazole  (PROTONIX ) EC tablet 40 mg  40 mg Oral QHS Volanda Gruber, NP   40 mg at 04/22/24 2141    Lab Results:  Results for orders placed or performed during the hospital encounter of 04/21/24 (from the past 48 hours)  Glucose, capillary     Status: Abnormal   Collection Time: 04/21/24  8:29 PM  Result Value Ref Range   Glucose-Capillary 173 (H) 70 - 99 mg/dL    Comment: Glucose reference range applies only to samples taken after fasting for at least 8 hours.   Comment 1 Notify RN    Comment 2 Document in Chart   Glucose, capillary     Status: Abnormal   Collection Time: 04/22/24  7:53 AM  Result Value Ref Range   Glucose-Capillary 130 (H) 70 - 99 mg/dL    Comment: Glucose reference range applies only to samples taken after fasting for at least 8 hours.  Glucose, capillary     Status: Abnormal   Collection Time: 04/22/24 11:24 AM  Result Value Ref Range   Glucose-Capillary 120 (H) 70 - 99 mg/dL    Comment: Glucose reference range applies only to samples taken after fasting for at least 8 hours.  Glucose, capillary     Status: Abnormal   Collection Time: 04/22/24  4:20 PM  Result Value Ref Range   Glucose-Capillary 100 (H) 70 - 99 mg/dL    Comment: Glucose reference range applies only to samples taken after fasting for at least 8 hours.  Glucose, capillary     Status: Abnormal   Collection Time: 04/22/24  7:52 PM  Result Value Ref Range   Glucose-Capillary 133 (H) 70 - 99 mg/dL    Comment: Glucose reference range applies only to samples taken after  fasting for at least 8 hours.  Glucose, capillary     Status: Abnormal   Collection Time: 04/23/24  7:26 AM  Result Value Ref Range   Glucose-Capillary 133 (H) 70 - 99 mg/dL    Comment: Glucose reference range applies only to samples taken after fasting for at least 8 hours.  Glucose, capillary     Status: None   Collection Time: 04/23/24 11:29 AM  Result Value Ref Range   Glucose-Capillary 93 70 - 99 mg/dL    Comment: Glucose reference range applies only to samples taken after fasting for at least 8 hours.    Blood Alcohol level:  Lab Results  Component Value Date   [facility] <15 04/19/2024   ETH <10 01/30/2024    Metabolic Disorder Labs: Lab Results  Component Value Date   HGBA1C 6.7 (H) 01/30/2024   MPG 145.59 01/30/2024   MPG 123 06/18/2023   No results found for: "PROLACTIN" Lab Results  Component Value Date   CHOL 278 (H) 01/30/2024   TRIG 210 (H) 01/30/2024   HDL 46 01/30/2024   CHOLHDL 6.0 01/30/2024   VLDL 42 (H) 01/30/2024   LDLCALC 190 (H) 01/30/2024   LDLCALC 98 07/19/2022    Physical Findings: AIMS:  , ,  ,  ,    CIWA:  CIWA-Ar Total: 0 COWS:      Psychiatric Specialty Exam:  Presentation  General Appearance:  Appropriate for Environment; Casual  Eye Contact: Fair  Speech: Clear and Coherent  Speech Volume: Normal    Mood and Affect  Mood: Anxious  Affect: Appropriate   Thought Process  Thought Processes: Coherent  Descriptions of Associations:Intact  Orientation:Full (Time, Place and Person)  Thought Content:Logical  Hallucinations:Hallucinations: None  Ideas of Reference:None  Suicidal Thoughts:Suicidal Thoughts: No SI Passive Intent and/or Plan: Without Intent; Without Plan  Homicidal Thoughts:Homicidal Thoughts: No   Sensorium  Memory: Immediate Fair; Recent Fair; Remote Fair  Judgment: Impaired  Insight: Shallow   Executive Functions  Concentration: Fair  Attention Span: Fair  Recall: Fair  Fund  of Knowledge: Fair  Language: Fair   Psychomotor Activity  Psychomotor Activity: Psychomotor Activity: Normal  Musculoskeletal: Strength & Muscle Tone: decreased Gait & Station: unsteady Assets  Assets: Manufacturing systems engineer; Desire for Improvement; Resilience; Social Support    Physical Exam: Physical Exam Vitals and nursing note reviewed.  HENT:     Head: Normocephalic.     Nose: Nose normal.     Mouth/Throat:     Mouth: Mucous membranes are moist.  Cardiovascular:     Pulses: Normal pulses.  Pulmonary:     Effort: Pulmonary effort is normal.  Abdominal:     Palpations: Abdomen is soft.  Skin:    General: Skin is warm.  Neurological:     Mental Status: He is alert.    Review of Systems  Constitutional: Negative.   HENT: Negative.    Eyes: Negative.   Respiratory: Negative.  Cardiovascular: Negative.   Skin: Negative.    Blood pressure (!) 160/109, pulse 80, temperature 98.4 F (36.9 C), resp. rate 18, height 5\' 6"  (1.676 m), weight 99.8 kg, SpO2 100%. Body mass index is 35.51 kg/m.  Diagnosis: Principal Problem:   MDD (major depressive disorder), recurrent severe, without psychosis (HCC) Alcohol use disorder severe Clinical Decision Making: Patient with history of depression, chronic alcoholism, s/p stroke with physical disability, wheelchair-bound currently admitted to inpatient psychiatric hospital for worsening depression and suicidal ideation, reportedly took overdose on 10 to 12 pills of Benadryl  as a suicide attempt, in the context of his wife of 20 years leaving him.  Patient has multiple psychosocial stressors including homelessness and chronic alcoholism.   Treatment Plan Summary:   Safety and Monitoring:             -- Involuntary admission to inpatient psychiatric unit for safety, stabilization and treatment             -- Daily contact with patient to assess and evaluate symptoms and progress in treatment             -- [name] case to be  discussed in multi-disciplinary team meeting             -- Observation Level: q15 minute checks             -- Vital signs:  q12 hours             -- Precautions: suicide, elopement, and assault   2. Psychiatric Diagnoses and Treatment:              Increasing Lexapro  to 10mg  daily to help with the depression we will monitor him closely on CIWA protocol and Librium taper     -- The risks/benefits/side-effects/alternatives to this medication were discussed in detail with the patient and time was given for questions. The patient consents to medication trial.                -- Metabolic profile and EKG monitoring obtained while on an atypical antipsychotic (BMI: Lipid Panel: HbgA1c: QTc:)              -- Encouraged patient to participate in unit milieu and in scheduled group therapies                            3. Medical Issues Being Addressed:    CIWA protocol and Librium taper 4. Discharge Planning:              -- Social work and case management to assist with discharge planning and identification of hospital follow-up needs prior to discharge             -- Estimated LOS: 5-7 days             -- Discharge Concerns: Need to establish a safety plan; Medication compliance and effectiveness             -- Discharge Goals: Return home with outpatient referrals follow ups  Aurelia Blotter, MD 04/23/2024, 2:38 PM

## 2024-04-23 NOTE — [facility] Suicide Risk Assessment (Signed)
 [facility] INPATIENT:  Family/Significant Other Suicide Prevention Education  Suicide Prevention Education:  Education Completed; Taylor Obrien, wife, [phone],  (name of family member/significant other) has been identified by the patient as the family member/significant other with whom the patient will be residing, and identified as the person(s) who will aid the patient in the event of a mental health crisis (suicidal ideations/suicide attempt).  With written consent from the patient, the family member/significant other has been provided the following suicide prevention education, prior to the and/or following the discharge of the patient.  The suicide prevention education provided includes the following: Suicide risk factors Suicide prevention and interventions National Suicide Hotline telephone number [facility] assessment telephone number [facility] Emergency Assistance 911 [facility] and/or Residential Mobile Crisis Unit telephone number  Request made of family/significant other to: Remove weapons (e.g., guns, rifles, knives), all items previously/currently identified as safety concern.   Remove drugs/medications (over-the-counter, prescriptions, illicit drugs), all items previously/currently identified as a safety concern.  The family member/significant other verbalizes understanding of the suicide prevention education information provided.  The family member/significant other agrees to remove the items of safety concern listed above.  Taylor Obrien 04/23/2024, 9:28 AM

## 2024-04-23 NOTE — BH IP Treatment Plan (Signed)
 Interdisciplinary Treatment and Diagnostic Plan Update  04/23/2024 Time of Session: 2:14PM Taylor Obrien MRN: [id number]  Principal Diagnosis: MDD (major depressive disorder), recurrent severe, without psychosis (HCC)  Secondary Diagnoses: Principal Problem:   MDD (major depressive disorder), recurrent severe, without psychosis (HCC)   Current Medications:  Current Facility-Administered Medications  Medication Dose Route Frequency Provider Last Rate Last Admin   acetaminophen  (TYLENOL ) tablet 650 mg  650 mg Oral Q6H PRN Volanda Gruber, NP       alum & mag hydroxide-simeth (MAALOX/MYLANTA) 200-200-20 MG/5ML suspension 30 mL  30 mL Oral Q4H PRN Volanda Gruber, NP       amLODipine  (NORVASC ) tablet 10 mg  10 mg Oral Daily Jadapalle, Sree, MD   10 mg at 04/23/24 9528   aspirin  tablet 325 mg  325 mg Oral Daily Volanda Gruber, NP   325 mg at 04/23/24 4132   atorvastatin  (LIPITOR) tablet 80 mg  80 mg Oral Daily Jadapalle, Sree, MD   80 mg at 04/23/24 0917   chlordiazePOXIDE (LIBRIUM) capsule 25 mg  25 mg Oral Q6H PRN Jadapalle, Sree, MD       cloNIDine  (CATAPRES  - Dosed in mg/24 hr) patch 0.3 mg  0.3 mg Transdermal Weekly Jadapalle, Sree, MD   0.3 mg at 04/22/24 1631   escitalopram  (LEXAPRO ) tablet 5 mg  5 mg Oral Daily Jadapalle, Sree, MD   5 mg at 04/23/24 0915   gabapentin  (NEURONTIN ) capsule 300 mg  300 mg Oral QHS Volanda Gruber, NP   300 mg at 04/22/24 2142   hydrALAZINE  (APRESOLINE ) tablet 25 mg  25 mg Oral BID Jadapalle, Sree, MD   25 mg at 04/23/24 4401   hydrOXYzine  (ATARAX ) tablet 25 mg  25 mg Oral Q6H PRN Jadapalle, Sree, MD       insulin  aspart (novoLOG ) injection 0-5 Units  0-5 Units Subcutaneous QHS Volanda Gruber, NP       insulin  aspart (novoLOG ) injection 0-9 Units  0-9 Units Subcutaneous TID WC Volanda Gruber, NP   1 Units at 04/23/24 1145   labetalol  (NORMODYNE ) tablet 300 mg  300 mg Oral BID Volanda Gruber, NP   300 mg at 04/23/24 0912   lidocaine  (LIDODERM ) 5 %  2 patch  2 patch Transdermal Q24H Volanda Gruber, NP   2 patch at 04/23/24 0917   loperamide (IMODIUM) capsule 2-4 mg  2-4 mg Oral PRN Jadapalle, Sree, MD       magnesium hydroxide (MILK OF MAGNESIA) suspension 30 mL  30 mL Oral Daily PRN Volanda Gruber, NP       melatonin tablet 2.5 mg  2.5 mg Oral QHS Volanda Gruber, NP   2.5 mg at 04/22/24 2141   menthol-cetylpyridinium (CEPACOL) lozenge 3 mg  1 lozenge Oral PRN Onuoha, Chinwendu V, NP   3 mg at 04/23/24 1420   multivitamin with minerals tablet 1 tablet  1 tablet Oral Daily Jadapalle, Sree, MD   1 tablet at 04/23/24 0915   nicotine  (NICODERM CQ  - dosed in mg/24 hours) patch 21 mg  21 mg Transdermal Q0600 Volanda Gruber, NP   21 mg at 04/23/24 0628   OLANZapine (ZYPREXA) injection 5 mg  5 mg Intramuscular TID PRN Volanda Gruber, NP       OLANZapine zydis (ZYPREXA) disintegrating tablet 5 mg  5 mg Oral TID PRN Volanda Gruber, NP       ondansetron  (ZOFRAN -ODT) disintegrating tablet 4 mg  4 mg Oral  Q6H PRN Jadapalle, Sree, MD       oxyCODONE  (Oxy IR/ROXICODONE ) immediate release tablet 5 mg  5 mg Oral Q8H PRN Jadapalle, Sree, MD   5 mg at 04/23/24 1610   pantoprazole  (PROTONIX ) EC tablet 40 mg  40 mg Oral QHS Volanda Gruber, NP   40 mg at 04/22/24 2141   PTA Medications: Medications Prior to Admission  Medication Sig Dispense Refill Last Dose/Taking   amLODipine  (NORVASC ) 10 MG tablet Take 1 tablet (10 mg total) by mouth daily. 30 tablet 0    aspirin  325 MG tablet Take 1 tablet (325 mg total) by mouth daily. 100 tablet 0    atorvastatin  (LIPITOR) 80 MG tablet Take 80 mg by mouth at bedtime. (Patient not taking: Reported on 04/20/2024)      cloNIDine  (CATAPRES  - DOSED IN MG/24 HR) 0.3 mg/24hr patch Place 1 patch (0.3 mg total) onto the skin once a week. Next change on monday (Patient not taking: Reported on 04/20/2024) 4 patch 12    empagliflozin (JARDIANCE) 25 MG TABS tablet Take 25 mg by mouth daily.       fenofibrate  (TRICOR ) 145 MG  tablet Take 145 mg by mouth daily.      hydrALAZINE  (APRESOLINE ) 25 MG tablet Take 1 tablet (25 mg total) by mouth 2 (two) times daily.      labetalol  (NORMODYNE ) 300 MG tablet Take 300 mg by mouth 2 (two) times daily. (Patient not taking: Reported on 04/20/2024)      lidocaine  (LIDODERM ) 5 % Place 2 patches onto the skin daily. Remove & Discard patch within 12 hours or as directed by MD      pantoprazole  (PROTONIX ) 40 MG tablet Take 1 tablet (40 mg total) by mouth at bedtime. 30 tablet 0    VASCEPA  1 g capsule Take 2 g by mouth 2 (two) times daily.       Patient Stressors: Marital or family conflict    Patient Strengths: Religious Affiliation   Treatment Modalities: Medication Management, Group therapy, Case management,  1 to 1 session with clinician, Psychoeducation, Recreational therapy.   Physician Treatment Plan for Primary Diagnosis: MDD (major depressive disorder), recurrent severe, without psychosis (HCC) Long Term Goal(s): Improvement in symptoms so as ready for discharge   Short Term Goals: Ability to identify changes in lifestyle to reduce recurrence of condition will improve Ability to verbalize feelings will improve Ability to disclose and discuss suicidal ideas Ability to demonstrate self-control will improve Ability to identify and develop effective coping behaviors will improve Ability to maintain clinical measurements within normal limits will improve  Medication Management: Evaluate patient's response, side effects, and tolerance of medication regimen.  Therapeutic Interventions: 1 to 1 sessions, Unit Group sessions and Medication administration.  Evaluation of Outcomes: Not Met  Physician Treatment Plan for Secondary Diagnosis: Principal Problem:   MDD (major depressive disorder), recurrent severe, without psychosis (HCC)  Long Term Goal(s): Improvement in symptoms so as ready for discharge   Short Term Goals: Ability to identify changes in lifestyle to reduce  recurrence of condition will improve Ability to verbalize feelings will improve Ability to disclose and discuss suicidal ideas Ability to demonstrate self-control will improve Ability to identify and develop effective coping behaviors will improve Ability to maintain clinical measurements within normal limits will improve     Medication Management: Evaluate patient's response, side effects, and tolerance of medication regimen.  Therapeutic Interventions: 1 to 1 sessions, Unit Group sessions and Medication administration.  Evaluation of Outcomes: Not  Met   RN Treatment Plan for Primary Diagnosis: MDD (major depressive disorder), recurrent severe, without psychosis (HCC) Long Term Goal(s): Knowledge of disease and therapeutic regimen to maintain health will improve  Short Term Goals: Ability to demonstrate self-control, Ability to participate in decision making will improve, Ability to verbalize feelings will improve, Ability to disclose and discuss suicidal ideas, Ability to identify and develop effective coping behaviors will improve, and Compliance with prescribed medications will improve  Medication Management: RN will administer medications as ordered by provider, will assess and evaluate patient's response and provide education to patient for prescribed medication. RN will report any adverse and/or side effects to prescribing provider.  Therapeutic Interventions: 1 on 1 counseling sessions, Psychoeducation, Medication administration, Evaluate responses to treatment, Monitor vital signs and CBGs as ordered, Perform/monitor CIWA, COWS, AIMS and Fall Risk screenings as ordered, Perform wound care treatments as ordered.  Evaluation of Outcomes: Not Met   LCSW Treatment Plan for Primary Diagnosis: MDD (major depressive disorder), recurrent severe, without psychosis (HCC) Long Term Goal(s): Safe transition to appropriate next level of care at discharge, Engage patient in therapeutic group  addressing interpersonal concerns.  Short Term Goals: Engage patient in aftercare planning with referrals and resources, Increase social support, Increase ability to appropriately verbalize feelings, Increase emotional regulation, Facilitate acceptance of mental health diagnosis and concerns, and Increase skills for wellness and recovery  Therapeutic Interventions: Assess for all discharge needs, 1 to 1 time with Social worker, Explore available resources and support systems, Assess for adequacy in community support network, Educate family and significant other(s) on suicide prevention, Complete Psychosocial Assessment, Interpersonal group therapy.  Evaluation of Outcomes: Not Met   Progress in Treatment: Attending groups: Yes. Participating in groups: Yes. Taking medication as prescribed: Yes. Toleration medication: Yes. Family/Significant other contact made: Yes, individual(s) contacted:  SPE completed with the patient's wife Patient understands diagnosis: Yes. Discussing patient identified problems/goals with staff: Yes. Medical problems stabilized or resolved: Yes. Denies suicidal/homicidal ideation: Yes. Issues/concerns per patient self-inventory: No. Other: none  New problem(s) identified: No, Describe:  none  New Short Term/Long Term Goal(s):  detox, elimination of symptoms of psychosis, medication management for mood stabilization; elimination of SI thoughts; development of comprehensive mental wellness/sobriety plan.   Patient Goals:  "I just want to go home. I miss them."  Discharge Plan or Barriers: CSW to assist in development of appropriate discharge plans.   Reason for Continuation of Hospitalization: Aggression Anxiety Depression Medication stabilization Suicidal ideation  Estimated Length of Stay:  1-7 days  Last 3 [place] Suicide Severity Risk Score: Flowsheet Row Admission (Current) from 04/21/2024 in [facility] ED to Hosp-Admission  (Discharged) from 04/19/2024 in [facility] MEDICAL SURGICAL UNIT ED from 03/06/2024 in [facility] Emergency Department at [facility]  C-SSRS RISK CATEGORY High Risk High Risk No Risk       Last PHQ 2/9 Scores:    10/18/2016    1:06 PM 01/19/2016   10:56 AM  Depression screen PHQ 2/9  Decreased Interest 2 0  Down, Depressed, Hopeless 2 0  PHQ - 2 Score 4 0    Scribe for Treatment Team: Larri Ply, LCSW 04/23/2024 3:07 PM

## 2024-04-23 NOTE — Group Note (Signed)
 Date:  04/23/2024 Time:  2:57 PM  Group Topic/Focus:  Coping With Mental Health Crisis:   The purpose of this group is to help patients identify strategies for coping with mental health crisis.  Group discusses possible causes of crisis and ways to manage them effectively. Spirituality:   The focus of this group is to discuss how one's spirituality can aide in recovery.    Participation Level:  Active  Participation Quality:  Attentive  Affect:  Appropriate  Cognitive:  Appropriate  Insight: Appropriate  Engagement in Group:  Engaged and Improving  Modes of Intervention:  Activity and Discussion  Additional Comments:    Alina Gilkey L Danielys Madry 04/23/2024, 2:57 PM

## 2024-04-23 NOTE — Progress Notes (Signed)
   04/22/24 2000  Psych Admission Type (Psych Patients Only)  Admission Status Involuntary  Psychosocial Assessment  Patient Complaints Depression  Eye Contact Brief  Facial Expression Animated  Affect Depressed  Speech Soft  Interaction Assertive  Motor Activity Unsteady  Appearance/Hygiene Unremarkable  Behavior Characteristics Cooperative  Mood Pleasant  Thought Process  Coherency WDL  Content Blaming others  Delusions None reported or observed  Perception WDL  Hallucination None reported or observed  Judgment Impaired  Confusion None  Danger to Self  Current suicidal ideation? Denies   Pt is alert and oriented. C/o back pain. Pain scale 7/10. Oxycodone  5mg  po given at 2145 for pain/discomfort. Endorses depression. Q 15 min checks maintained for safety. CIWA score 0. No s/s of w/d noted. Denies SI/HI/AVH.

## 2024-04-23 NOTE — Group Note (Signed)
 Recreation Therapy Group Note   Group Topic:Leisure Education  Group Date: 04/23/2024 Start Time: 1500 End Time: 1600 Facilitators: Deatrice Factor, LRT, CTRS Location: Dayroom  Group Description: Bingo. LRT and patients played multiple games of Bingo with music playing in the background. LRT and pts discussed how this could be a leisure interest and the importance of doing things they enjoy post-discharge. Pts won stress balls or candy, as approved by Charity fundraiser, as their prizes.    Goal Area(s) Addressed: Patient will identify leisure interests.  Patient will practice healthy decision making. Patient will engage in recreation activity.  Patient will increase communication.   Affect/Mood: Appropriate   Participation Level: Active and Engaged   Participation Quality: Independent   Behavior: Calm and Cooperative   Speech/Thought Process: Coherent   Insight: Good   Judgement: Good   Modes of Intervention: Clarification, Cooperative Play, and Exploration   Patient Response to Interventions:  Attentive, Engaged, Interested , and Receptive   Education Outcome:  Acknowledges education   Clinical Observations/Individualized Feedback: Sailor was active in their participation of session activities and group discussion. Pt won a Facilities manager and received sugar free chocolate as a Retail banker. Pt interacted well with LRT and peers duration of session.    Plan: Continue to engage patient in RT group sessions 2-3x/week.   Deatrice Factor, LRT, CTRS 04/23/2024 4:35 PM

## 2024-04-24 DIAGNOSIS — F332 Major depressive disorder, recurrent severe without psychotic features: Secondary | ICD-10-CM | POA: Diagnosis not present

## 2024-04-24 LAB — GLUCOSE, CAPILLARY
Glucose-Capillary: 146 mg/dL — ABNORMAL HIGH (ref 70–99)
Glucose-Capillary: 138 mg/dL — ABNORMAL HIGH (ref 70–99)
Glucose-Capillary: 120 mg/dL — ABNORMAL HIGH (ref 70–99)
Glucose-Capillary: 196 mg/dL — ABNORMAL HIGH (ref 70–99)

## 2024-04-24 NOTE — Progress Notes (Signed)
   04/23/24 2100  Psych Admission Type (Psych Patients Only)  Admission Status Involuntary  Psychosocial Assessment  Patient Complaints None  Eye Contact Fair  Facial Expression Animated  Affect Appropriate to circumstance  Speech Soft  Interaction Assertive  Motor Activity Unsteady  Appearance/Hygiene Unremarkable  Behavior Characteristics Cooperative  Mood Pleasant  Thought Process  Coherency WDL  Content Blaming others  Delusions None reported or observed  Perception WDL  Hallucination None reported or observed  Judgment Impaired  Confusion None  Danger to Self  Current suicidal ideation? Denies   Pt is alert and oriented. C/o back pain. Oxycodone  5mg  po given PRN for pain/discomfort. Visible on the unit. Attends group. Denies SI/HI/AVH.

## 2024-04-24 NOTE — Plan of Care (Signed)

## 2024-04-24 NOTE — Plan of Care (Signed)
°  Problem: Education: °Goal: Utilization of techniques to improve thought processes will improve °Outcome: Progressing °Goal: Knowledge of the prescribed therapeutic regimen will improve °Outcome: Progressing °  °Problem: Activity: °Goal: Interest or engagement in leisure activities will improve °Outcome: Progressing °Goal: Imbalance in normal sleep/wake cycle will improve °Outcome: Progressing °  °Problem: Coping: °Goal: Coping ability will improve °Outcome: Progressing °Goal: Will verbalize feelings °Outcome: Progressing °  °Problem: Health Behavior/Discharge Planning: °Goal: Ability to make decisions will improve °Outcome: Progressing °Goal: Compliance with therapeutic regimen will improve °Outcome: Progressing °  °Problem: Role Relationship: °Goal: Will demonstrate positive changes in social behaviors and relationships °Outcome: Progressing °  °Problem: Safety: °Goal: Ability to disclose and discuss suicidal ideas will improve °Outcome: Progressing °Goal: Ability to identify and utilize support systems that promote safety will improve °Outcome: Progressing °  °Problem: Self-Concept: °Goal: Will verbalize positive feelings about self °Outcome: Progressing °Goal: Level of anxiety will decrease °Outcome: Progressing °  °Problem: Education: °Goal: Ability to state activities that reduce stress will improve °Outcome: Progressing °  °Problem: Coping: °Goal: Ability to identify and develop effective coping behavior will improve °Outcome: Progressing °  °Problem: Self-Concept: °Goal: Ability to identify factors that promote anxiety will improve °Outcome: Progressing °Goal: Level of anxiety will decrease °Outcome: Progressing °Goal: Ability to modify response to factors that promote anxiety will improve °Outcome: Progressing °  °

## 2024-04-24 NOTE — Group Note (Signed)
 Date:  04/24/2024 Time:  10:14 AM  Group Topic/Focus:  Unit expectations and housekeeping goals.    Participation Level:  Active  Participation Quality:  Appropriate  Affect:  Appropriate  Cognitive:  Appropriate  Insight: Appropriate  Engagement in Group:  Engaged  Modes of Intervention:  Discussion  Additional Comments:  none  Merton Abts 04/24/2024, 10:14 AM

## 2024-04-24 NOTE — Progress Notes (Signed)
 [facility] MD Progress Note  04/24/2024 8:17 PM Taylor Obrien  MRN:  [id number]  Taylor Obrien is a 58 year old African-American male with history of depression, chronic alcohol use presented to the emergency room on IVC after reporting ingesting 10 to 12 pills of Benadryl  as a suicide attempt, in the context of his wife of 20 years leaving him. Patient is currently admitted to the geropsych unit for further stabilization .   Subjective:  Chart reviewed, case discussed in multidisciplinary meeting, patient seen during rounds.    Patient is noted to be interacting with peers with at times poor boundaries with male peers.  Patient informs the provider that he is doing quite well denies SI/HI/plan.  He understands that he is being monitored on the increased dose of antidepressant and denies having any side effects.  He denies current suicidal/homicidal ideation/plan.  He denies auditory/visual hallucinations.  Per nursing report no episodes of agitation or behavioral problems on the unit. Sleep: Fair  Appetite:  Fair  Past Psychiatric History: see h&P Family History:  Family History  Problem Relation Age of Onset   Diabetes Mother    Hypertension Mother    Drug abuse Mother    Hyperlipidemia Mother    Diabetes Father    Hypertension Father    Hyperlipidemia Father    Diabetes Brother    Hypertension Brother    Social History:  Social History   Substance and Sexual Activity  Alcohol Use Not Currently   Comment: 2 40 oz daily. 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 Stopped liquor 5 years ago.states no etoh since 03/2022     Social History   Substance and Sexual Activity  Drug Use Yes   Types: Marijuana   Comment: occassionally    Social History   Socioeconomic History   Marital status: Married    Spouse name: Not on file   Number of children: Not on file   Years of education: Not on file   Highest education level: Not on file  Occupational History   Not on file  Tobacco Use    Smoking status: Every Day    Current packs/day: 0.50    Average packs/day: 0.5 packs/day for 30.0 years (15.0 ttl pk-yrs)    Types: Cigarettes   Smokeless tobacco: Never  Vaping Use   Vaping status: Never Used  Substance and Sexual Activity   Alcohol use: Not Currently    Comment: 2 40 oz daily. 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 Stopped liquor 5 years ago.states no etoh since 03/2022   Drug use: Yes    Types: Marijuana    Comment: occassionally   Sexual activity: Yes    Birth control/protection: None  Other Topics Concern   Not on file  Social History Narrative   Not on file   Social Drivers of Health   Financial Resource Strain: Not on file  Food Insecurity: Food Insecurity Present (04/21/2024)   Hunger Vital Sign    Worried About Running Out of Food in the Last Year: Often true    Ran Out of Food in the Last Year: Often true  Transportation Needs: Unmet Transportation Needs (04/21/2024)   PRAPARE - Administrator, Civil Service (Medical): Yes    Lack of Transportation (Non-Medical): Yes  Physical Activity: Not on file  Stress: Not on file  Social Connections: Not on file   Past Medical History:  Past Medical History:  Diagnosis Date   Acute alcoholic pancreatitis    Arthritis  Chronic pain    Depression    Diabetes mellitus without complication (HCC)    Essential hypertension, benign    GERD (gastroesophageal reflux disease)    Headache    HTN (hypertension) 11/27/2015   Hyperlipidemia    Sleep apnea    could not tolerate CPAP   Stroke (HCC)    2016    Past Surgical History:  Procedure Laterality Date   BIOPSY  08/19/2022   Procedure: BIOPSY;  Surgeon: Vinetta Greening, DO;  Location: [facility];  Service: Endoscopy;;   CLOSED REDUCTION MANDIBULAR FRACTURE W/ ARCH BARS     + multiple extractions   COLONOSCOPY WITH PROPOFOL  N/A 08/19/2022   Procedure: COLONOSCOPY WITH PROPOFOL ;  Surgeon: Vinetta Greening, DO;  Location: [facility];   Service: Endoscopy;  Laterality: N/A;  9:15am   Condyloma resection     ESOPHAGOGASTRODUODENOSCOPY (EGD) WITH PROPOFOL  N/A 08/19/2022   Procedure: ESOPHAGOGASTRODUODENOSCOPY (EGD) WITH PROPOFOL ;  Surgeon: Vinetta Greening, DO;  Location: [facility];  Service: Endoscopy;  Laterality: N/A;   MULTIPLE EXTRACTIONS WITH ALVEOLOPLASTY Bilateral 01/23/2018   Procedure: DENTAL EXTRACTION OF TEETH NUMBER ONE, TWO, THREE, FOUR, FIVE, SIX, SEVEN, EIGHT, NINE, TEN, ELEVEN, TWELVE, THIRTEEN, FOURTEEN, FIFTEEN, SIXTEEN, SEVENTEEN, TWENTY, TWENTY-ONE, TWENTY-TWO, TWENTY-THREE, TWENTY-FOUR, TWENTY-FIVE, TWENTY-SIX, TWENTY-SEVEN, TWENTY-EIGHT, TWENTY-NINE, THIRTY-TWO WITH ALVEOLOPLASTY;  Surgeon: Ascencion Lava, DDS;  Location: [facility] OR;  Service: Oral Surgery;  Lat   POLYPECTOMY  08/19/2022   Procedure: POLYPECTOMY;  Surgeon: Vinetta Greening, DO;  Location: [facility];  Service: Endoscopy;;   RADIOLOGY WITH ANESTHESIA N/A 11/18/2015   Procedure: RADIOLOGY WITH ANESTHESIA;  Surgeon: Luellen Sages, MD;  Location: [facility] OR;  Service: Radiology;  Laterality: N/A;   Removal foreign body right shoulder     Right rotator cuff repair     TEE WITHOUT CARDIOVERSION N/A 11/21/2015   Procedure: TRANSESOPHAGEAL ECHOCARDIOGRAM (TEE);  Surgeon: Darlis Eisenmenger, MD;  Location: [facility] END[facility]COPY;  Service: Cardiovascular;  Laterality: N/A;   TOOTH EXTRACTION N/A 01/24/2018   Procedure: SUTURE ORAL WOUND;  Surgeon: Ascencion Lava, DDS;  Location: [facility] OR;  Service: Oral Surgery;  Laterality: N/A;    Current Medications: Current Facility-Administered Medications  Medication Dose Route Frequency Provider Last Rate Last Admin   acetaminophen  (TYLENOL ) tablet 650 mg  650 mg Oral Q6H PRN Volanda Gruber, NP       alum & mag hydroxide-simeth (MAALOX/MYLANTA) 200-200-20 MG/5ML suspension 30 mL  30 mL Oral Q4H PRN Volanda Gruber, NP       amLODipine  (NORVASC ) tablet 10 mg  10 mg Oral Daily Jareth Pardee, MD   10 mg at 04/24/24 1610    aspirin  tablet 325 mg  325 mg Oral Daily Volanda Gruber, NP   325 mg at 04/24/24 9604   atorvastatin  (LIPITOR ) tablet 80 mg  80 mg Oral Daily Jerlisa Diliberto, MD   80 mg at 04/24/24 5409   chlordiazePOXIDE  (LIBRIUM ) capsule 25 mg  25 mg Oral Q6H PRN Quentina Fronek, MD       cloNIDine  (CATAPRES  - Dosed in mg/24 hr) patch 0.3 mg  0.3 mg Transdermal Weekly Delaila Nand, MD   0.3 mg at 04/23/24 1833   escitalopram  (LEXAPRO ) tablet 10 mg  10 mg Oral Daily Manford Sprong, MD   10 mg at 04/24/24 8119   gabapentin  (NEURONTIN ) capsule 300 mg  300 mg Oral QHS Volanda Gruber, NP   300 mg at 04/23/24 2130   hydrALAZINE  (APRESOLINE ) tablet 25 mg  25 mg  Oral BID Lenell Mcconnell, MD   25 mg at 04/24/24 0931   hydrOXYzine  (ATARAX ) tablet 25 mg  25 mg Oral Q6H PRN Jakaylee Sasaki, MD       insulin  aspart (novoLOG ) injection 0-5 Units  0-5 Units Subcutaneous QHS Volanda Gruber, NP       insulin  aspart (novoLOG ) injection 0-9 Units  0-9 Units Subcutaneous TID WC Volanda Gruber, NP   1 Units at 04/24/24 1153   labetalol  (NORMODYNE ) tablet 300 mg  300 mg Oral BID Volanda Gruber, NP   300 mg at 04/24/24 0932   lidocaine  (LIDODERM ) 5 % 2 patch  2 patch Transdermal Q24H Volanda Gruber, NP   2 patch at 04/24/24 4098   loperamide  (IMODIUM ) capsule 2-4 mg  2-4 mg Oral PRN Kashena Novitski, MD       magnesium  hydroxide (MILK OF MAGNESIA) suspension 30 mL  30 mL Oral Daily PRN Volanda Gruber, NP       melatonin tablet 2.5 mg  2.5 mg Oral QHS Volanda Gruber, NP   2.5 mg at 04/23/24 2130   menthol -cetylpyridinium (CEPACOL) lozenge 3 mg  1 lozenge Oral PRN Onuoha, Chinwendu V, NP   3 mg at 04/24/24 1317   multivitamin with minerals tablet 1 tablet  1 tablet Oral Daily Kaide Gage, MD   1 tablet at 04/24/24 1191   nicotine  (NICODERM CQ  - dosed in mg/24 hours) patch 21 mg  21 mg Transdermal Q0600 Volanda Gruber, NP   21 mg at 04/24/24 0507   OLANZapine  (ZYPREXA ) injection 5 mg  5 mg Intramuscular TID PRN  Volanda Gruber, NP       OLANZapine  zydis (ZYPREXA ) disintegrating tablet 5 mg  5 mg Oral TID PRN Volanda Gruber, NP       ondansetron  (ZOFRAN -ODT) disintegrating tablet 4 mg  4 mg Oral Q6H PRN Ger Ringenberg, MD       oxyCODONE  (Oxy IR/ROXICODONE ) immediate release tablet 5 mg  5 mg Oral Q8H PRN Clydene Burack, MD   5 mg at 04/24/24 1317   pantoprazole  (PROTONIX ) EC tablet 40 mg  40 mg Oral QHS Volanda Gruber, NP   40 mg at 04/23/24 2130    Lab Results:  Results for orders placed or performed during the hospital encounter of 04/21/24 (from the past 48 hours)  Glucose, capillary     Status: Abnormal   Collection Time: 04/23/24  7:26 AM  Result Value Ref Range   Glucose-Capillary 133 (H) 70 - 99 mg/dL    Comment: Glucose reference range applies only to samples taken after fasting for at least 8 hours.  Glucose, capillary     Status: None   Collection Time: 04/23/24 11:29 AM  Result Value Ref Range   Glucose-Capillary 93 70 - 99 mg/dL    Comment: Glucose reference range applies only to samples taken after fasting for at least 8 hours.  Glucose, capillary     Status: Abnormal   Collection Time: 04/23/24  4:14 PM  Result Value Ref Range   Glucose-Capillary 156 (H) 70 - 99 mg/dL    Comment: Glucose reference range applies only to samples taken after fasting for at least 8 hours.  Glucose, capillary     Status: Abnormal   Collection Time: 04/23/24  7:50 PM  Result Value Ref Range   Glucose-Capillary 141 (H) 70 - 99 mg/dL    Comment: Glucose reference range applies only to samples taken after fasting for at least  8 hours.  Glucose, capillary     Status: Abnormal   Collection Time: 04/24/24  7:36 AM  Result Value Ref Range   Glucose-Capillary 146 (H) 70 - 99 mg/dL    Comment: Glucose reference range applies only to samples taken after fasting for at least 8 hours.  Glucose, capillary     Status: Abnormal   Collection Time: 04/24/24 11:36 AM  Result Value Ref Range    Glucose-Capillary 138 (H) 70 - 99 mg/dL    Comment: Glucose reference range applies only to samples taken after fasting for at least 8 hours.  Glucose, capillary     Status: Abnormal   Collection Time: 04/24/24  4:12 PM  Result Value Ref Range   Glucose-Capillary 120 (H) 70 - 99 mg/dL    Comment: Glucose reference range applies only to samples taken after fasting for at least 8 hours.    Blood Alcohol level:  Lab Results  Component Value Date   [facility] <15 04/19/2024   ETH <10 01/30/2024    Metabolic Disorder Labs: Lab Results  Component Value Date   HGBA1C 6.7 (H) 01/30/2024   MPG 145.59 01/30/2024   MPG 123 06/18/2023   No results found for: "PROLACTIN" Lab Results  Component Value Date   CHOL 278 (H) 01/30/2024   TRIG 210 (H) 01/30/2024   HDL 46 01/30/2024   CHOLHDL 6.0 01/30/2024   VLDL 42 (H) 01/30/2024   LDLCALC 190 (H) 01/30/2024   LDLCALC 98 07/19/2022    Physical Findings: AIMS:  , ,  ,  ,    CIWA:  CIWA-Ar Total: 0 COWS:      Psychiatric Specialty Exam:  Presentation  General Appearance:  Appropriate for Environment; Casual  Eye Contact: Fair  Speech: Clear and Coherent  Speech Volume: Normal    Mood and Affect  Mood: good Affect: Appropriate   Thought Process  Thought Processes: Coherent  Descriptions of Associations:Intact  Orientation:Full (Time, Place and Person)  Thought Content:Logical  Hallucinations:Hallucinations: None  Ideas of Reference:None  Suicidal Thoughts:Suicidal Thoughts: No  Homicidal Thoughts:Homicidal Thoughts: No   Sensorium  Memory: Immediate Fair; Recent Fair; Remote Fair  Judgment: Impaired  Insight: Shallow   Executive Functions  Concentration: Fair  Attention Span: Fair  Recall: [employer] of Knowledge: Fair  Language: Fair   Psychomotor Activity  Psychomotor Activity: Psychomotor Activity: Normal  Musculoskeletal: Strength & Muscle Tone: decreased Gait & Station:  unsteady Assets  Assets: Manufacturing systems engineer; Desire for Improvement; Resilience; Social Support    Physical Exam: Physical Exam Vitals and nursing note reviewed.  HENT:     Head: Normocephalic.     Nose: Nose normal.     Mouth/Throat:     Mouth: Mucous membranes are moist.  Cardiovascular:     Pulses: Normal pulses.  Pulmonary:     Effort: Pulmonary effort is normal.  Abdominal:     Palpations: Abdomen is soft.  Skin:    General: Skin is warm.  Neurological:     Mental Status: He is alert.    Review of Systems  Constitutional: Negative.   HENT: Negative.    Eyes: Negative.   Respiratory: Negative.    Cardiovascular: Negative.   Skin: Negative.    Blood pressure (!) 153/101, pulse 87, temperature 97.9 F (36.6 C), resp. rate 18, height 5\' 6"  (1.676 m), weight 99.8 kg, SpO2 99%. Body mass index is 35.51 kg/m.  Diagnosis: Principal Problem:   MDD (major depressive disorder), recurrent severe, without psychosis (HCC) Alcohol  use disorder severe Clinical Decision Making: Patient with history of depression, chronic alcoholism, s/p stroke with physical disability, wheelchair-bound currently admitted to inpatient psychiatric hospital for worsening depression and suicidal ideation, reportedly took overdose on 10 to 12 pills of Benadryl  as a suicide attempt, in the context of his wife of 20 years leaving him.  Patient has multiple psychosocial stressors including homelessness and chronic alcoholism.   Treatment Plan Summary:   Safety and Monitoring:             -- Involuntary admission to inpatient psychiatric unit for safety, stabilization and treatment             -- Daily contact with patient to assess and evaluate symptoms and progress in treatment             -- [name] case to be discussed in multi-disciplinary team meeting             -- Observation Level: q15 minute checks             -- Vital signs:  q12 hours             -- Precautions: suicide, elopement, and  assault   2. Psychiatric Diagnoses and Treatment:              Lexapro   10mg  daily to help with the depression we will monitor him closely on CIWA protocol and Librium  taper     -- The risks/benefits/side-effects/alternatives to this medication were discussed in detail with the patient and time was given for questions. The patient consents to medication trial.                -- Metabolic profile and EKG monitoring obtained while on an atypical antipsychotic (BMI: Lipid Panel: HbgA1c: QTc:)              -- Encouraged patient to participate in unit milieu and in scheduled group therapies                            3. Medical Issues Being Addressed:    CIWA protocol and Librium  taper 4. Discharge Planning:              -- Social work and case management to assist with discharge planning and identification of hospital follow-up needs prior to discharge             -- Estimated LOS: 5-7 days             -- Discharge Concerns: Need to establish a safety plan; Medication compliance and effectiveness             -- Discharge Goals: Return home with outpatient referrals follow ups  Aurelia Blotter, MD 04/24/2024, 8:17 PM

## 2024-04-24 NOTE — [facility] Counselor (Signed)
 CSW made referral for pt to go to [facility] for follow-up. CSW awaits confirmation for appointment.   Derrill Flirt, MSW, [place] 04/24/2024 1:42 PM

## 2024-04-24 NOTE — Progress Notes (Signed)
   04/24/24 0714  Psych Admission Type (Psych Patients Only)  Admission Status Involuntary  Psychosocial Assessment  Patient Complaints None  Eye Contact Fair  Facial Expression Animated  Affect Appropriate to circumstance  Speech Logical/coherent (muffled)  Interaction Assertive;Needy  Motor Activity Slow  Appearance/Hygiene In scrubs  Behavior Characteristics Cooperative  Mood Pleasant  Thought Process  Coherency WDL  Content WDL  Delusions None reported or observed  Perception WDL  Hallucination None reported or observed  Judgment WDL  Confusion None  Danger to Self  Current suicidal ideation? Denies  Danger to Others  Danger to Others None reported or observed

## 2024-04-24 NOTE — Progress Notes (Signed)
   04/24/24 0615  15 Minute Checks  Location Bedroom  Visual Appearance Calm  Behavior Sleeping  Sleep (Behavioral Health Patients Only)  Calculate sleep? (Click Yes once per 24 hr at 0600 safety check) Yes  Documented sleep last 24 hours 8.75

## 2024-04-25 DIAGNOSIS — F332 Major depressive disorder, recurrent severe without psychotic features: Secondary | ICD-10-CM | POA: Diagnosis not present

## 2024-04-25 LAB — GLUCOSE, CAPILLARY
Glucose-Capillary: 124 mg/dL — ABNORMAL HIGH (ref 70–99)
Glucose-Capillary: 101 mg/dL — ABNORMAL HIGH (ref 70–99)
Glucose-Capillary: 97 mg/dL (ref 70–99)
Glucose-Capillary: 133 mg/dL — ABNORMAL HIGH (ref 70–99)

## 2024-04-25 NOTE — Progress Notes (Signed)
   04/24/24 2100  Psych Admission Type (Psych Patients Only)  Admission Status Involuntary  Psychosocial Assessment  Patient Complaints None  Eye Contact Fair  Facial Expression Animated  Affect Appropriate to circumstance  Speech Logical/coherent  Interaction Needy;Assertive  Motor Activity Slow  Appearance/Hygiene In scrubs  Behavior Characteristics Cooperative  Mood Pleasant  Thought Process  Coherency WDL  Content WDL  Delusions None reported or observed  Perception WDL  Hallucination None reported or observed  Judgment WDL  Confusion None  Danger to Self  Current suicidal ideation? Denies

## 2024-04-25 NOTE — Plan of Care (Signed)

## 2024-04-25 NOTE — Group Note (Signed)
 Date:  04/25/2024 Time:  12:34 AM  Group Topic/Focus:  Wrap-Up Group:   The focus of this group is to help patients review their daily goal of treatment and discuss progress on daily workbooks.    Participation Level:  Active  Participation Quality:  Appropriate  Affect:  Appropriate  Cognitive:  Alert  Insight: Appropriate  Engagement in Group:  Engaged  Modes of Intervention:  Discussion  Additional Comments:     Maglione,Gracen Ringwald E 04/25/2024, 12:34 AM

## 2024-04-25 NOTE — Progress Notes (Signed)
 Patient is an involuntary admission to [facility] for MDD who is expected to discharge tomorrow.  Patient has been calm, cooperative, friendly with peers and staff.  Denies SI, HI, AVH, anxiety and depression.  He has arranged for a friend to pick him up tomorrow when he is discharged.  Will continue to monitor.

## 2024-04-25 NOTE — Progress Notes (Signed)
 [facility] MD Progress Note  04/25/2024 5:04 PM JAICION LAURIE  MRN:  [id number]  Taylor Obrien is a 58 year old African-American male with history of depression, chronic alcohol use presented to the emergency room on IVC after reporting ingesting 10 to 12 pills of Benadryl  as a suicide attempt, in the context of his wife of 20 years leaving him. Patient is currently admitted to the geropsych unit for further stabilization .   Subjective:  Chart reviewed, case discussed in multidisciplinary meeting, patient seen during rounds.   On interview patient is noted to be in the day room interacting with the peers.  He continues to be in a good mood and is focused on discharge and going home to his family.  He offers no questions or concerns or complaints.  He reportedly is taking his medication with no reported side effect.  He denies current SI/HI/plan and denies hallucinations.  Sleep: Fair  Appetite:  Fair  Past Psychiatric History: see h&P Family History:  Family History  Problem Relation Age of Onset   Diabetes Mother    Hypertension Mother    Drug abuse Mother    Hyperlipidemia Mother    Diabetes Father    Hypertension Father    Hyperlipidemia Father    Diabetes Brother    Hypertension Brother    Social History:  Social History   Substance and Sexual Activity  Alcohol Use Not Currently   Comment: 2 40 oz daily. 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 Stopped liquor 5 years ago.states no etoh since 03/2022     Social History   Substance and Sexual Activity  Drug Use Yes   Types: Marijuana   Comment: occassionally    Social History   Socioeconomic History   Marital status: Married    Spouse name: Not on file   Number of children: Not on file   Years of education: Not on file   Highest education level: Not on file  Occupational History   Not on file  Tobacco Use   Smoking status: Every Day    Current packs/day: 0.50    Average packs/day: 0.5 packs/day for 30.0 years (15.0 ttl  pk-yrs)    Types: Cigarettes   Smokeless tobacco: Never  Vaping Use   Vaping status: Never Used  Substance and Sexual Activity   Alcohol use: Not Currently    Comment: 2 40 oz daily. 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 Stopped liquor 5 years ago.states no etoh since 03/2022   Drug use: Yes    Types: Marijuana    Comment: occassionally   Sexual activity: Yes    Birth control/protection: None  Other Topics Concern   Not on file  Social History Narrative   Not on file   Social Drivers of Health   Financial Resource Strain: Not on file  Food Insecurity: Food Insecurity Present (04/21/2024)   Hunger Vital Sign    Worried About Running Out of Food in the Last Year: Often true    Ran Out of Food in the Last Year: Often true  Transportation Needs: Unmet Transportation Needs (04/21/2024)   PRAPARE - Administrator, Civil Service (Medical): Yes    Lack of Transportation (Non-Medical): Yes  Physical Activity: Not on file  Stress: Not on file  Social Connections: Not on file   Past Medical History:  Past Medical History:  Diagnosis Date   Acute alcoholic pancreatitis    Arthritis    Chronic pain    Depression  Diabetes mellitus without complication (HCC)    Essential hypertension, benign    GERD (gastroesophageal reflux disease)    Headache    HTN (hypertension) 11/27/2015   Hyperlipidemia    Sleep apnea    could not tolerate CPAP   Stroke (HCC)    2016    Past Surgical History:  Procedure Laterality Date   BIOPSY  08/19/2022   Procedure: BIOPSY;  Surgeon: Vinetta Greening, DO;  Location: [facility];  Service: Endoscopy;;   CLOSED REDUCTION MANDIBULAR FRACTURE W/ ARCH BARS     + multiple extractions   COLONOSCOPY WITH PROPOFOL  N/A 08/19/2022   Procedure: COLONOSCOPY WITH PROPOFOL ;  Surgeon: Vinetta Greening, DO;  Location: [facility];  Service: Endoscopy;  Laterality: N/A;  9:15am   Condyloma resection     ESOPHAGOGASTRODUODENOSCOPY (EGD) WITH PROPOFOL   N/A 08/19/2022   Procedure: ESOPHAGOGASTRODUODENOSCOPY (EGD) WITH PROPOFOL ;  Surgeon: Vinetta Greening, DO;  Location: [facility];  Service: Endoscopy;  Laterality: N/A;   MULTIPLE EXTRACTIONS WITH ALVEOLOPLASTY Bilateral 01/23/2018   Procedure: DENTAL EXTRACTION OF TEETH NUMBER ONE, TWO, THREE, FOUR, FIVE, SIX, SEVEN, EIGHT, NINE, TEN, ELEVEN, TWELVE, THIRTEEN, FOURTEEN, FIFTEEN, SIXTEEN, SEVENTEEN, TWENTY, TWENTY-ONE, TWENTY-TWO, TWENTY-THREE, TWENTY-FOUR, TWENTY-FIVE, TWENTY-SIX, TWENTY-SEVEN, TWENTY-EIGHT, TWENTY-NINE, THIRTY-TWO WITH ALVEOLOPLASTY;  Surgeon: Ascencion Lava, DDS;  Location: [facility] OR;  Service: Oral Surgery;  Lat   POLYPECTOMY  08/19/2022   Procedure: POLYPECTOMY;  Surgeon: Vinetta Greening, DO;  Location: [facility];  Service: Endoscopy;;   RADIOLOGY WITH ANESTHESIA N/A 11/18/2015   Procedure: RADIOLOGY WITH ANESTHESIA;  Surgeon: Luellen Sages, MD;  Location: [facility] OR;  Service: Radiology;  Laterality: N/A;   Removal foreign body right shoulder     Right rotator cuff repair     TEE WITHOUT CARDIOVERSION N/A 11/21/2015   Procedure: TRANSESOPHAGEAL ECHOCARDIOGRAM (TEE);  Surgeon: Darlis Eisenmenger, MD;  Location: [facility] ENDOSCOPY;  Service: Cardiovascular;  Laterality: N/A;   TOOTH EXTRACTION N/A 01/24/2018   Procedure: SUTURE ORAL WOUND;  Surgeon: Ascencion Lava, DDS;  Location: [facility];  Service: Oral Surgery;  Laterality: N/A;    Current Medications: Current Facility-Administered Medications  Medication Dose Route Frequency Provider Last Rate Last Admin   acetaminophen  (TYLENOL ) tablet 650 mg  650 mg Oral Q6H PRN Volanda Gruber, NP       alum & mag hydroxide-simeth (MAALOX/MYLANTA) 200-200-20 MG/5ML suspension 30 mL  30 mL Oral Q4H PRN Volanda Gruber, NP       amLODipine  (NORVASC ) tablet 10 mg  10 mg Oral Daily Cedrica Brune, MD   10 mg at 04/25/24 1610   aspirin  tablet 325 mg  325 mg Oral Daily Volanda Gruber, NP   325 mg at 04/25/24 0941   atorvastatin  (LIPITOR ) tablet  80 mg  80 mg Oral Daily Sanyiah Kanzler, MD   80 mg at 04/25/24 9604   cloNIDine  (CATAPRES  - Dosed in mg/24 hr) patch 0.3 mg  0.3 mg Transdermal Weekly Ronell Duffus, MD   0.3 mg at 04/23/24 1833   escitalopram  (LEXAPRO ) tablet 10 mg  10 mg Oral Daily Kaian Fahs, MD   10 mg at 04/25/24 5409   gabapentin  (NEURONTIN ) capsule 300 mg  300 mg Oral QHS Volanda Gruber, NP   300 mg at 04/24/24 2124   hydrALAZINE  (APRESOLINE ) tablet 25 mg  25 mg Oral BID Cyril Railey, MD   25 mg at 04/25/24 8119   insulin  aspart (novoLOG ) injection 0-5 Units  0-5 Units Subcutaneous QHS Volanda Gruber, NP  insulin  aspart (novoLOG ) injection 0-9 Units  0-9 Units Subcutaneous TID WC Volanda Gruber, NP   1 Units at 04/25/24 0751   labetalol  (NORMODYNE ) tablet 300 mg  300 mg Oral BID Volanda Gruber, NP   300 mg at 04/25/24 0941   lidocaine  (LIDODERM ) 5 % 2 patch  2 patch Transdermal Q24H Volanda Gruber, NP   2 patch at 04/25/24 0940   magnesium  hydroxide (MILK OF MAGNESIA) suspension 30 mL  30 mL Oral Daily PRN Volanda Gruber, NP       melatonin tablet 2.5 mg  2.5 mg Oral QHS Volanda Gruber, NP   2.5 mg at 04/24/24 2124   menthol -cetylpyridinium (CEPACOL) lozenge 3 mg  1 lozenge Oral PRN Onuoha, Chinwendu V, NP   3 mg at 04/25/24 1430   multivitamin with minerals tablet 1 tablet  1 tablet Oral Daily Endya Austin, MD   1 tablet at 04/25/24 0941   nicotine  (NICODERM CQ  - dosed in mg/24 hours) patch 21 mg  21 mg Transdermal Q0600 Volanda Gruber, NP   21 mg at 04/25/24 1610   OLANZapine  (ZYPREXA ) injection 5 mg  5 mg Intramuscular TID PRN Volanda Gruber, NP       OLANZapine  zydis (ZYPREXA ) disintegrating tablet 5 mg  5 mg Oral TID PRN Volanda Gruber, NP       oxyCODONE  (Oxy IR/ROXICODONE ) immediate release tablet 5 mg  5 mg Oral Q8H PRN Lisaanne Lawrie, MD   5 mg at 04/25/24 0940   pantoprazole  (PROTONIX ) EC tablet 40 mg  40 mg Oral QHS Volanda Gruber, NP   40 mg at 04/24/24 2125    Lab  Results:  Results for orders placed or performed during the hospital encounter of 04/21/24 (from the past 48 hours)  Glucose, capillary     Status: Abnormal   Collection Time: 04/23/24  7:50 PM  Result Value Ref Range   Glucose-Capillary 141 (H) 70 - 99 mg/dL    Comment: Glucose reference range applies only to samples taken after fasting for at least 8 hours.  Glucose, capillary     Status: Abnormal   Collection Time: 04/24/24  7:36 AM  Result Value Ref Range   Glucose-Capillary 146 (H) 70 - 99 mg/dL    Comment: Glucose reference range applies only to samples taken after fasting for at least 8 hours.  Glucose, capillary     Status: Abnormal   Collection Time: 04/24/24 11:36 AM  Result Value Ref Range   Glucose-Capillary 138 (H) 70 - 99 mg/dL    Comment: Glucose reference range applies only to samples taken after fasting for at least 8 hours.  Glucose, capillary     Status: Abnormal   Collection Time: 04/24/24  4:12 PM  Result Value Ref Range   Glucose-Capillary 120 (H) 70 - 99 mg/dL    Comment: Glucose reference range applies only to samples taken after fasting for at least 8 hours.  Glucose, capillary     Status: Abnormal   Collection Time: 04/24/24  8:51 PM  Result Value Ref Range   Glucose-Capillary 196 (H) 70 - 99 mg/dL    Comment: Glucose reference range applies only to samples taken after fasting for at least 8 hours.  Glucose, capillary     Status: Abnormal   Collection Time: 04/25/24  7:24 AM  Result Value Ref Range   Glucose-Capillary 124 (H) 70 - 99 mg/dL    Comment: Glucose reference range applies only to samples  taken after fasting for at least 8 hours.  Glucose, capillary     Status: Abnormal   Collection Time: 04/25/24 11:36 AM  Result Value Ref Range   Glucose-Capillary 101 (H) 70 - 99 mg/dL    Comment: Glucose reference range applies only to samples taken after fasting for at least 8 hours.  Glucose, capillary     Status: None   Collection Time: 04/25/24  4:23 PM   Result Value Ref Range   Glucose-Capillary 97 70 - 99 mg/dL    Comment: Glucose reference range applies only to samples taken after fasting for at least 8 hours.    Blood Alcohol level:  Lab Results  Component Value Date   [facility] <15 04/19/2024   ETH <10 01/30/2024    Metabolic Disorder Labs: Lab Results  Component Value Date   HGBA1C 6.7 (H) 01/30/2024   MPG 145.59 01/30/2024   MPG 123 06/18/2023   No results found for: "PROLACTIN" Lab Results  Component Value Date   CHOL 278 (H) 01/30/2024   TRIG 210 (H) 01/30/2024   HDL 46 01/30/2024   CHOLHDL 6.0 01/30/2024   VLDL 42 (H) 01/30/2024   LDLCALC 190 (H) 01/30/2024   LDLCALC 98 07/19/2022    Physical Findings: AIMS:  , ,  ,  ,    CIWA:  CIWA-Ar Total: 0 COWS:      Psychiatric Specialty Exam:  Presentation  General Appearance:  Appropriate for Environment; Casual  Eye Contact: Fair  Speech: Clear and Coherent  Speech Volume: Normal    Mood and Affect  Mood: good Affect: Appropriate   Thought Process  Thought Processes: Coherent  Descriptions of Associations:Intact  Orientation:Full (Time, Place and Person)  Thought Content:Logical  Hallucinations: Denies  Ideas of Reference:None  Suicidal Thoughts: Denies  Homicidal Thoughts: Denies   Sensorium  Memory: Immediate Fair; Recent Fair; Remote Fair  Judgment: Impaired  Insight: Shallow   Executive Functions  Concentration: Fair  Attention Span: Fair  Recall: [employer] of Knowledge: Fair  Language: Fair   Psychomotor Activity  Psychomotor Activity: No data recorded  Musculoskeletal: Strength & Muscle Tone: decreased Gait & Station: unsteady Assets  Assets: Manufacturing systems engineer; Desire for Improvement; Resilience; Social Support    Physical Exam: Physical Exam Vitals and nursing note reviewed.  HENT:     Head: Normocephalic.     Nose: Nose normal.     Mouth/Throat:     Mouth: Mucous membranes are  moist.  Cardiovascular:     Pulses: Normal pulses.  Pulmonary:     Effort: Pulmonary effort is normal.  Abdominal:     Palpations: Abdomen is soft.  Skin:    General: Skin is warm.  Neurological:     Mental Status: He is alert.    Review of Systems  Constitutional: Negative.   HENT: Negative.    Eyes: Negative.   Respiratory: Negative.    Cardiovascular: Negative.   Skin: Negative.    Blood pressure (!) 171/98, pulse 81, temperature 97.7 F (36.5 C), resp. rate 20, height 5\' 6"  (1.676 m), weight 99.8 kg, SpO2 100%. Body mass index is 35.51 kg/m.  Diagnosis: Principal Problem:   MDD (major depressive disorder), recurrent severe, without psychosis (HCC) Alcohol use disorder severe Clinical Decision Making: Patient with history of depression, chronic alcoholism, s/p stroke with physical disability, wheelchair-bound currently admitted to inpatient psychiatric hospital for worsening depression and suicidal ideation, reportedly took overdose on 10 to 12 pills of Benadryl  as a suicide attempt, in the  context of his wife of 20 years leaving him.  Patient has multiple psychosocial stressors including homelessness and chronic alcoholism.   Treatment Plan Summary:   Safety and Monitoring:             -- Involuntary admission to inpatient psychiatric unit for safety, stabilization and treatment             -- Daily contact with patient to assess and evaluate symptoms and progress in treatment             -- [name] case to be discussed in multi-disciplinary team meeting             -- Observation Level: q15 minute checks             -- Vital signs:  q12 hours             -- Precautions: suicide, elopement, and assault   2. Psychiatric Diagnoses and Treatment:              Lexapro   10mg  daily to help with the depression we will monitor him closely on CIWA protocol and Librium  taper     -- The risks/benefits/side-effects/alternatives to this medication were discussed in detail with  the patient and time was given for questions. The patient consents to medication trial.                -- Metabolic profile and EKG monitoring obtained while on an atypical antipsychotic (BMI: Lipid Panel: HbgA1c: QTc:)              -- Encouraged patient to participate in unit milieu and in scheduled group therapies                            3. Medical Issues Being Addressed:    CIWA protocol and Librium  taper 4. Discharge Planning:              -- Social work and case management to assist with discharge planning and identification of hospital follow-up needs prior to discharge             -- Estimated LOS: 5-7 days             -- Discharge Concerns: Need to establish a safety plan; Medication compliance and effectiveness             -- Discharge Goals: Return home with outpatient referrals follow ups  Aurelia Blotter, MD 04/25/2024, 5:04 PM

## 2024-04-25 NOTE — Progress Notes (Signed)
   04/25/24 0640  15 Minute Checks  Location Hallway  Visual Appearance Calm  Behavior Composed  Sleep (Behavioral Health Patients Only)  Calculate sleep? (Click Yes once per 24 hr at 0600 safety check) Yes  Documented sleep last 24 hours 8.25

## 2024-04-26 DIAGNOSIS — F332 Major depressive disorder, recurrent severe without psychotic features: Secondary | ICD-10-CM | POA: Diagnosis not present

## 2024-04-26 LAB — GLUCOSE, CAPILLARY
Glucose-Capillary: 183 mg/dL — ABNORMAL HIGH (ref 70–99)
Glucose-Capillary: 111 mg/dL — ABNORMAL HIGH (ref 70–99)

## 2024-04-26 MED ORDER — INSULIN ASPART 100 UNIT/ML IJ SOLN
0.0000 [IU] | Freq: Every day | INTRAMUSCULAR | 11 refills | Status: DC
Start: 2024-04-26 — End: 2024-10-31

## 2024-04-26 MED ORDER — GABAPENTIN 300 MG PO CAPS: 300.0000 mg | ORAL_CAPSULE | Freq: Every day | ORAL | 0 refills | Status: DC

## 2024-04-26 MED ORDER — ATORVASTATIN CALCIUM 80 MG PO TABS: 80.0000 mg | ORAL_TABLET | Freq: Every day | ORAL | 0 refills | Status: DC

## 2024-04-26 MED ORDER — OXYCODONE HCL 5 MG PO TABS: 5.0000 mg | ORAL_TABLET | Freq: Three times a day (TID) | ORAL | 0 refills | Status: DC | PRN

## 2024-04-26 MED ORDER — INSULIN ASPART 100 UNIT/ML IJ SOLN
0.0000 [IU] | Freq: Three times a day (TID) | INTRAMUSCULAR | 11 refills | Status: DC
Start: 2024-04-26 — End: 2024-10-31

## 2024-04-26 MED ORDER — LIDOCAINE 5 % EX PTCH: 2.0000 | MEDICATED_PATCH | CUTANEOUS | 0 refills | Status: DC

## 2024-04-26 MED ORDER — ADULT MULTIVITAMIN W/MINERALS CH: 1.0000 | ORAL_TABLET | Freq: Every day | ORAL | 0 refills | Status: DC

## 2024-04-26 MED ORDER — CLONIDINE 0.3 MG/24HR TD PTWK: 0.3000 mg | MEDICATED_PATCH | TRANSDERMAL | 12 refills | Status: DC

## 2024-04-26 MED ORDER — NICOTINE 21 MG/24HR TD PT24: 21.0000 mg | MEDICATED_PATCH | Freq: Every day | TRANSDERMAL | 0 refills | Status: DC

## 2024-04-26 MED ORDER — MELATONIN 5 MG PO TABS: 2.5000 mg | ORAL_TABLET | Freq: Every day | ORAL | 0 refills | Status: DC

## 2024-04-26 MED ORDER — LABETALOL HCL 300 MG PO TABS: 300.0000 mg | ORAL_TABLET | Freq: Two times a day (BID) | ORAL | 0 refills | Status: DC

## 2024-04-26 MED ORDER — ESCITALOPRAM OXALATE 10 MG PO TABS: 10.0000 mg | ORAL_TABLET | Freq: Every day | ORAL | 0 refills | Status: DC

## 2024-04-26 NOTE — Discharge Summary (Signed)
 Physician Discharge Summary Note  Patient:  Taylor Obrien is an 58 y.o., male MRN:  [id number] DOB:  16-Aug-1966 Patient phone:  [phone] (home)  Patient address:   [address] [place] [address],    Date of Admission:  04/21/2024 Date of Discharge: 04/26/24  Reason for Admission:  Taylor Obrien is a 58 year old African-American male with history of depression, chronic alcohol use presented to the emergency room on IVC after reporting ingesting 10 to 12 pills of Benadryl  as a suicide attempt, in the context of his wife of 20 years leaving him. Patient is currently admitted to the geropsych unit for further stabilization .   Principal Problem: MDD (major depressive disorder), recurrent severe, without psychosis (HCC) Discharge Diagnoses: Principal Problem:   MDD (major depressive disorder), recurrent severe, without psychosis (HCC)   Past Psychiatric History: see h&p  Family Psychiatric  History: see h&p Social History:  Social History   Substance and Sexual Activity  Alcohol Use Not Currently   Comment: 2 40 oz daily. 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 Stopped liquor 5 years ago.states no etoh since 03/2022     Social History   Substance and Sexual Activity  Drug Use Yes   Types: Marijuana   Comment: occassionally    Social History   Socioeconomic History   Marital status: Married    Spouse name: Not on file   Number of children: Not on file   Years of education: Not on file   Highest education level: Not on file  Occupational History   Not on file  Tobacco Use   Smoking status: Every Day    Current packs/day: 0.50    Average packs/day: 0.5 packs/day for 30.0 years (15.0 ttl pk-yrs)    Types: Cigarettes   Smokeless tobacco: Never  Vaping Use   Vaping status: Never Used  Substance and Sexual Activity   Alcohol use: Not Currently    Comment: 2 40 oz daily. 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 Stopped liquor 5 years ago.states no etoh since 03/2022   Drug  use: Yes    Types: Marijuana    Comment: occassionally   Sexual activity: Yes    Birth control/protection: None  Other Topics Concern   Not on file  Social History Narrative   Not on file   Social Drivers of Health   Financial Resource Strain: Not on file  Food Insecurity: Food Insecurity Present (04/21/2024)   Hunger Vital Sign    Worried About Running Out of Food in the Last Year: Often true    Ran Out of Food in the Last Year: Often true  Transportation Needs: Unmet Transportation Needs (04/21/2024)   PRAPARE - Administrator, Civil Service (Medical): Yes    Lack of Transportation (Non-Medical): Yes  Physical Activity: Not on file  Stress: Not on file  Social Connections: Not on file   Past Medical History:  Past Medical History:  Diagnosis Date   Acute alcoholic pancreatitis    Arthritis    Chronic pain    Depression    Diabetes mellitus without complication (HCC)    Essential hypertension, benign    GERD (gastroesophageal reflux disease)    Headache    HTN (hypertension) 11/27/2015   Hyperlipidemia    Sleep apnea    could not tolerate CPAP   Stroke (HCC)    2016    Past Surgical History:  Procedure Laterality Date   BIOPSY  08/19/2022   Procedure: BIOPSY;  Surgeon: Vinetta Greening, DO;  [facility]: [facility];  Service: Endoscopy;;   CLOSED REDUCTION MANDIBULAR FRACTURE W/ ARCH BARS     + multiple extractions   COLONOSCOPY WITH PROPOFOL  N/A 08/19/2022   Procedure: COLONOSCOPY WITH PROPOFOL ;  Surgeon: Vinetta Greening, DO;  Location: [facility];  Service: Endoscopy;  Laterality: N/A;  9:15am   Condyloma resection     ESOPHAGOGASTRODUODENOSCOPY (EGD) WITH PROPOFOL  N/A 08/19/2022   Procedure: ESOPHAGOGASTRODUODENOSCOPY (EGD) WITH PROPOFOL ;  Surgeon: Vinetta Greening, DO;  Location: [facility];  Service: Endoscopy;  Laterality: N/A;   MULTIPLE EXTRACTIONS WITH ALVEOLOPLASTY Bilateral 01/23/2018   Procedure: DENTAL EXTRACTION OF TEETH NUMBER ONE,  TWO, THREE, FOUR, FIVE, SIX, SEVEN, EIGHT, NINE, TEN, ELEVEN, TWELVE, THIRTEEN, FOURTEEN, FIFTEEN, SIXTEEN, SEVENTEEN, TWENTY, TWENTY-ONE, TWENTY-TWO, TWENTY-THREE, TWENTY-FOUR, TWENTY-FIVE, TWENTY-SIX, TWENTY-SEVEN, TWENTY-EIGHT, TWENTY-NINE, THIRTY-TWO WITH ALVEOLOPLASTY;  Surgeon: Ascencion Lava, DDS;  Location: [facility] OR;  Service: Oral Surgery;  Lat   POLYPECTOMY  08/19/2022   Procedure: POLYPECTOMY;  Surgeon: Vinetta Greening, DO;  Location: [facility];  Service: Endoscopy;;   RADIOLOGY WITH ANESTHESIA N/A 11/18/2015   Procedure: RADIOLOGY WITH ANESTHESIA;  Surgeon: Luellen Sages, MD;  Location: [facility] OR;  Service: Radiology;  Laterality: N/A;   Removal foreign body right shoulder     Right rotator cuff repair     TEE WITHOUT CARDIOVERSION N/A 11/21/2015   Procedure: TRANSESOPHAGEAL ECHOCARDIOGRAM (TEE);  Surgeon: Darlis Eisenmenger, MD;  Location: [facility] ENDOSCOPY;  Service: Cardiovascular;  Laterality: N/A;   TOOTH EXTRACTION N/A 01/24/2018   Procedure: SUTURE ORAL WOUND;  Surgeon: Ascencion Lava, DDS;  Location: [facility] OR;  Service: Oral Surgery;  Laterality: N/A;   Family History:  Family History  Problem Relation Age of Onset   Diabetes Mother    Hypertension Mother    Drug abuse Mother    Hyperlipidemia Mother    Diabetes Father    Hypertension Father    Hyperlipidemia Father    Diabetes Brother    Hypertension Brother     Hospital Course:  Taylor Obrien is a 58 year old African-American male with history of depression, chronic alcohol use presented to the emergency room on IVC after reporting ingesting 10 to 12 pills of Benadryl  as a suicide attempt, in the context of his wife of 20 years leaving him. Patient is currently admitted to the geropsych unit for further stabilization . Patient is admitted to [facility] unit with Q15 min safety monitoring. Multidisciplinary team approach is offered. Medication management; group/milieu therapy is offered. On admission patient was started on CIWA  protocol and lexapro  on 5 mg titrated it up to 10 mg. Patient tolerated the medications very well with no reported side effects.  Patient maintain safe behaviors and engaged very well on the unit.  On the day of discharge patient denied SI/HI/plan and denied hallucinations.  He had no withdrawal symptoms and was very stable.  His wife informed the team that patient can come back home.  Patient was discharged home with outpatient mental health appointments.  Physical Findings: AIMS:  , ,  ,  ,    CIWA:  CIWA-Ar Total: 0 COWS:        Psychiatric Specialty Exam:  Presentation  General Appearance:  Appropriate for Environment; Casual  Eye Contact: Fair  Speech: Clear and Coherent  Speech Volume: Normal    Mood and Affect  Mood: Anxious  Affect: Appropriate   Thought Process  Thought Processes: Coherent  Descriptions of Associations:Intact  Orientation:Full (Time, Place and  Person)  Thought Content:Logical  Hallucinations:No data recorded Ideas of Reference:None  Suicidal Thoughts:No data recorded Homicidal Thoughts:No data recorded  Sensorium  Memory: Immediate Fair; Recent Fair; Remote Fair  Judgment: Impaired  Insight: Shallow   Executive Functions  Concentration: Fair  Attention Span: Fair  Recall: Fair  Fund of Knowledge: Fair  Language: Fair   Psychomotor Activity  Psychomotor Activity:No data recorded Musculoskeletal: Strength & Muscle Tone: within normal limits Gait & Station: normal Assets  Assets: Manufacturing systems engineer; Desire for Improvement; Resilience; Social Support   Sleep  Sleep:No data recorded   Physical Exam: Physical Exam ROS Blood pressure (!) 154/88, pulse 87, temperature 97.9 F (36.6 C), resp. rate 14, height 5\' 6"  (1.676 m), weight 99.8 kg, SpO2 99%. Body mass index is 35.51 kg/m.   Social History   Tobacco Use  Smoking Status Every Day   Current packs/day: 0.50   Average packs/day: 0.5  packs/day for 30.0 years (15.0 ttl pk-yrs)   Types: Cigarettes  Smokeless Tobacco Never   Tobacco Cessation:  A prescription for an FDA-approved tobacco cessation medication provided at discharge   Blood Alcohol level:  Lab Results  Component Value Date   [facility] <15 04/19/2024   ETH <10 01/30/2024    Metabolic Disorder Labs:  Lab Results  Component Value Date   HGBA1C 6.7 (H) 01/30/2024   MPG 145.59 01/30/2024   MPG 123 06/18/2023   No results found for: "PROLACTIN" Lab Results  Component Value Date   CHOL 278 (H) 01/30/2024   TRIG 210 (H) 01/30/2024   HDL 46 01/30/2024   CHOLHDL 6.0 01/30/2024   VLDL 42 (H) 01/30/2024   LDLCALC 190 (H) 01/30/2024   LDLCALC 98 07/19/2022    See Psychiatric Specialty Exam and Suicide Risk Assessment completed by Attending Physician prior to discharge.  Discharge destination:  Home  Is patient on multiple antipsychotic therapies at discharge:  No   Has Patient had three or more failed trials of antipsychotic monotherapy by history:  No  Recommended Plan for Multiple Antipsychotic Therapies: NA  Discharge Instructions     Diet - low sodium heart healthy   Complete by: As directed    Increase activity slowly   Complete by: As directed       Allergies as of 04/26/2024       Reactions   Ibuprofen  Other (See Comments)   Avoid per PCP   Oxcarbazepine Other (See Comments)   Patient goes out of right state of mind.         Medication List     STOP taking these medications    cloNIDine  0.3 mg/24hr patch Commonly known as: CATAPRES  - Dosed in mg/24 hr Replaced by: cloNIDine  0.3 mg/24hr patch       TAKE these medications      Indication  amLODipine  10 MG tablet Commonly known as: NORVASC  Take 1 tablet (10 mg total) by mouth daily.    aspirin  325 MG tablet Take 1 tablet (325 mg total) by mouth daily.    atorvastatin  80 MG tablet Commonly known as: LIPITOR  Take 1 tablet (80 mg total) by mouth daily. What changed:  when to take this    cloNIDine  0.3 mg/24hr patch Commonly known as: CATAPRES  - Dosed in mg/24 hr Place 1 patch (0.3 mg total) onto the skin once a week. Start taking on: Apr 29, 2024 Replaces: cloNIDine  0.3 mg/24hr patch    empagliflozin  25 MG Tabs tablet Commonly known as: JARDIANCE  Take 25 mg by mouth daily.  escitalopram  10 MG tablet Commonly known as: LEXAPRO  Take 1 tablet (10 mg total) by mouth daily.    fenofibrate  145 MG tablet Commonly known as: TRICOR  Take 145 mg by mouth daily.    gabapentin  300 MG capsule Commonly known as: NEURONTIN  Take 1 capsule (300 mg total) by mouth at bedtime.    hydrALAZINE  25 MG tablet Commonly known as: APRESOLINE  Take 1 tablet (25 mg total) by mouth 2 (two) times daily.    insulin  aspart 100 UNIT/ML injection Commonly known as: novoLOG  Inject 0-5 Units into the skin at bedtime.    insulin  aspart 100 UNIT/ML injection Commonly known as: novoLOG  Inject 0-9 Units into the skin 3 (three) times daily with meals.    labetalol  300 MG tablet Commonly known as: NORMODYNE  Take 1 tablet (300 mg total) by mouth 2 (two) times daily.    lidocaine  5 % Commonly known as: LIDODERM  Place 2 patches onto the skin daily. Remove & Discard patch within 12 hours or as directed by MD    melatonin 5 MG Tabs Take 0.5 tablets (2.5 mg total) by mouth at bedtime.    multivitamin with minerals Tabs tablet Take 1 tablet by mouth daily.    nicotine  21 mg/24hr patch Commonly known as: NICODERM CQ  - dosed in mg/24 hours Place 1 patch (21 mg total) onto the skin daily at 6 (six) AM.    oxyCODONE  5 MG immediate release tablet Commonly known as: Oxy IR/ROXICODONE  Take 1 tablet (5 mg total) by mouth every 8 (eight) hours as needed for moderate pain (pain score 4-6).    pantoprazole  40 MG tablet Commonly known as: PROTONIX  Take 1 tablet (40 mg total) by mouth at bedtime.    Vascepa  1 g capsule Generic drug: icosapent  Ethyl Take 2 g by mouth 2 (two)  times daily.          Follow-up recommendations:  Activity:  As tolerated    Signed: Deaveon Schoen, MD 04/26/2024, 12:12 AM

## 2024-04-26 NOTE — Group Note (Deleted)
 Date:  04/26/2024 Time:  12:08 PM  Group Topic/Focus:  Personal Choices and Values:   The focus of this group is to help patients assess and explore the importance of values in their lives, how their values affect their decisions, how they express their values and what opposes their expression.     Participation Level:  {BHH PARTICIPATION HQION:62952}  Participation Quality:  {BHH PARTICIPATION QUALITY:22265}  Affect:  {BHH AFFECT:22266}  Cognitive:  {BHH COGNITIVE:22267}  Insight: {BHH Insight2:20797}  Engagement in Group:  {BHH ENGAGEMENT IN WUXLK:44010}  Modes of Intervention:  {BHH MODES OF INTERVENTION:22269}  Additional Comments:  ***  Fay Swider T Yojan Paskett 04/26/2024, 12:08 PM

## 2024-04-26 NOTE — Progress Notes (Signed)
  [facility] Adult Case Management Discharge Plan :  Will you be returning to the same living situation after discharge:  Yes,  pt will return home  At discharge, do you have transportation home?: Yes,  pt's pastor  will pick him up  Do you have the ability to pay for your medications: Yes,   MEDICARE / MEDICARE PART A AND B  Release of information consent forms completed and in the chart;  [name] signature needed at discharge.  Patient to Follow up at:  Follow-up Information     [facility] Follow up on 04/30/2024.   Why: You appointment is scheduled for 8:30 AM via the phone and will receive a call at [phone] Contact information: [address]  [address] [place] [address] [phone]                 Next level of care provider has access to [facility] Link:no  Safety Planning and Suicide Prevention discussed: Yes,  Izick Gasbarro, wife, [phone],     Has patient been referred to the Quitline?: Patient refused referral for treatment  Patient has been referred for addiction treatment: No known substance use disorder.  [address] LCSWA 04/26/2024, 9:44 AM

## 2024-04-26 NOTE — Group Note (Signed)
 Date:  04/26/2024 Time:  12:00 AM  Group Topic/Focus:  Wrap-Up Group:   The focus of this group is to help patients review their daily goal of treatment and discuss progress on daily workbooks.    Participation Level:  Active  Participation Quality:  Appropriate  Affect:  Appropriate  Cognitive:  Appropriate  Insight: Good  Engagement in Group:  Engaged  Modes of Intervention:  Discussion  Additional Comments:    Rolland Cline 04/26/2024, 12:00 AM

## 2024-04-26 NOTE — Group Note (Signed)
 Date:  04/26/2024 Time:  12:10 PM  Group Topic/Focus:  The purpose of this group is to touch into patients past and discuss what it was like and some of their likes and dislikes from their past.    Participation Level:  Active  Participation Quality:  Appropriate  Affect:  Appropriate  Cognitive:  Appropriate  Insight: Appropriate  Engagement in Group:  Engaged  Modes of Intervention:  Activity and Discussion  Additional Comments:    Taylor Obrien 04/26/2024, 12:10 PM

## 2024-04-26 NOTE — Progress Notes (Signed)
   04/26/24 0719  Psych Admission Type (Psych Patients Only)  Admission Status Involuntary  Psychosocial Assessment  Patient Complaints None  Eye Contact Fair  Facial Expression Animated  Affect Appropriate to circumstance  Speech Logical/coherent  Interaction Assertive  Motor Activity Slow  Appearance/Hygiene In scrubs  Behavior Characteristics Cooperative  Mood Pleasant  Thought Process  Coherency WDL  Content WDL  Delusions None reported or observed  Perception WDL  Hallucination None reported or observed  Judgment WDL  Confusion None  Danger to Self  Current suicidal ideation? Denies  Danger to Others  Danger to Others None reported or observed

## 2024-04-26 NOTE — Progress Notes (Signed)
 Pleasant and reports readiness for discharge, adding "I am going to [facility]."  Pt denied SI/HI, anxiety, depression and AVH.  He remains care compliant.    04/25/24 2200  Psych Admission Type (Psych Patients Only)  Admission Status Involuntary  Psychosocial Assessment  Patient Complaints None  Eye Contact Fair  Facial Expression Animated  Affect Appropriate to circumstance  Speech Logical/coherent  Interaction Assertive  Motor Activity Slow  Appearance/Hygiene In scrubs  Behavior Characteristics Cooperative  Mood Pleasant  Thought Process  Coherency WDL  Content WDL  Delusions None reported or observed  Perception WDL  Hallucination None reported or observed  Judgment WDL  Confusion None  Danger to Self  Current suicidal ideation? Denies  Danger to Others  Danger to Others None reported or observed   Problem: Education: Goal: Utilization of techniques to improve thought processes will improve Outcome: Progressing Goal: Knowledge of the prescribed therapeutic regimen will improve Outcome: Progressing

## 2024-04-26 NOTE — BHH Suicide Risk Assessment (Signed)
 [facility] Discharge Suicide Risk Assessment   Principal Problem: MDD (major depressive disorder), recurrent severe, without psychosis (HCC) Discharge Diagnoses: Principal Problem:   MDD (major depressive disorder), recurrent severe, without psychosis (HCC)   Total Time spent with patient: 30 minutes  Musculoskeletal: Strength & Muscle Tone: within normal limits Gait & Station: unsteady Patient leans: N/A  Psychiatric Specialty Exam  Presentation  General Appearance:  Appropriate for Environment; Casual  Eye Contact: Fair  Speech: Clear and Coherent  Speech Volume: Normal  Handedness: Right   Mood and Affect  Mood: Anxious  Duration of Depression Symptoms: Greater than two weeks  Affect: Appropriate   Thought Process  Thought Processes: Coherent  Descriptions of Associations:Intact  Orientation:Full (Time, Place and Person)  Thought Content:Logical  History of Schizophrenia/Schizoaffective disorder:No  Duration of Psychotic Symptoms:No data recorded Hallucinations:No data recorded Ideas of Reference:None  Suicidal Thoughts:No data recorded Homicidal Thoughts:No data recorded  Sensorium  Memory: Immediate Fair; Recent Fair; Remote Fair  Judgment: Impaired  Insight: Shallow   Executive Functions  Concentration: Fair  Attention Span: Fair  Recall: [employer] of Knowledge: Fair  Language: Fair   Psychomotor Activity  Psychomotor Activity:No data recorded  Assets  Assets: Communication Skills; Desire for Improvement; Resilience; Social Support   Sleep  Sleep:No data recorded  Physical Exam: Physical Exam ROS Blood pressure (!) 154/88, pulse 87, temperature 97.9 F (36.6 C), resp. rate 14, height 5\' 6"  (1.676 m), weight 99.8 kg, SpO2 99%. Body mass index is 35.51 kg/m.  Mental Status Per Nursing Assessment::   On Admission:  Suicidal ideation indicated by patient, Plan to harm others  Demographic Factors:  Male and  Low socioeconomic status  Loss Factors: Decrease in vocational status  Historical Factors: Impulsivity  Risk Reduction Factors:   Sense of responsibility to family, Religious beliefs about death, Living with another person, especially a relative, Positive social support, Positive therapeutic relationship, and Positive coping skills or problem solving skills  Continued Clinical Symptoms:  Depression:   Comorbid alcohol abuse/dependence  Cognitive Features That Contribute To Risk:  None    Suicide Risk:  Minimal: No identifiable suicidal ideation.  Patients presenting with no risk factors but with morbid ruminations; may be classified as minimal risk based on the severity of the depressive symptoms    Plan Of Care/Follow-up recommendations:  Activity:  As tolerated  Aurelia Blotter, MD 04/26/2024, 12:11 AM

## 2024-04-26 NOTE — Care Management Important Message (Signed)
 Important Message  Patient Details  Name: Taylor Obrien MRN: [id number] Date of Birth: June 23, 1966   Important Message Given:  Yes - Medicare IM     Claudio Culver, LCSWA 04/26/2024, 9:40 AM

## 2024-04-26 NOTE — Plan of Care (Signed)
  Problem: Education: Goal: Utilization of techniques to improve thought processes will improve 04/26/2024 1326 by Espiridion Heft, RN Outcome: Adequate for Discharge 04/26/2024 1325 by Espiridion Heft, RN Outcome: Adequate for Discharge Goal: Knowledge of the prescribed therapeutic regimen will improve 04/26/2024 1326 by Espiridion Heft, RN Outcome: Adequate for Discharge 04/26/2024 1325 by Espiridion Heft, RN Outcome: Adequate for Discharge   Problem: Activity: Goal: Interest or engagement in leisure activities will improve 04/26/2024 1326 by Espiridion Heft, RN Outcome: Adequate for Discharge 04/26/2024 1325 by Espiridion Heft, RN Outcome: Adequate for Discharge Goal: Imbalance in normal sleep/wake cycle will improve 04/26/2024 1326 by Espiridion Heft, RN Outcome: Adequate for Discharge 04/26/2024 1325 by Espiridion Heft, RN Outcome: Adequate for Discharge   Problem: Coping: Goal: Coping ability will improve 04/26/2024 1326 by Espiridion Heft, RN Outcome: Adequate for Discharge 04/26/2024 1325 by Espiridion Heft, RN Outcome: Adequate for Discharge Goal: Will verbalize feelings 04/26/2024 1326 by Espiridion Heft, RN Outcome: Adequate for Discharge 04/26/2024 1325 by Espiridion Heft, RN Outcome: Adequate for Discharge   Problem: Health Behavior/Discharge Planning: Goal: Ability to make decisions will improve 04/26/2024 1326 by Espiridion Heft, RN Outcome: Adequate for Discharge 04/26/2024 1325 by Espiridion Heft, RN Outcome: Adequate for Discharge Goal: Compliance with therapeutic regimen will improve 04/26/2024 1326 by Espiridion Heft, RN Outcome: Adequate for Discharge 04/26/2024 1325 by Espiridion Heft, RN Outcome: Adequate for Discharge   Problem: Role Relationship: Goal: Will demonstrate positive changes in social behaviors and relationships 04/26/2024 1326 by Espiridion Heft, RN Outcome: Adequate for Discharge 04/26/2024 1325  by Espiridion Heft, RN Outcome: Adequate for Discharge   Problem: Safety: Goal: Ability to disclose and discuss suicidal ideas will improve 04/26/2024 1326 by Espiridion Heft, RN Outcome: Adequate for Discharge 04/26/2024 1325 by Espiridion Heft, RN Outcome: Adequate for Discharge Goal: Ability to identify and utilize support systems that promote safety will improve 04/26/2024 1326 by Espiridion Heft, RN Outcome: Adequate for Discharge 04/26/2024 1325 by Espiridion Heft, RN Outcome: Adequate for Discharge   Problem: Self-Concept: Goal: Will verbalize positive feelings about self 04/26/2024 1326 by Espiridion Heft, RN Outcome: Adequate for Discharge 04/26/2024 1325 by Espiridion Heft, RN Outcome: Adequate for Discharge Goal: Level of anxiety will decrease 04/26/2024 1326 by Espiridion Heft, RN Outcome: Adequate for Discharge 04/26/2024 1325 by Espiridion Heft, RN Outcome: Adequate for Discharge   Problem: Education: Goal: Ability to state activities that reduce stress will improve 04/26/2024 1326 by Espiridion Heft, RN Outcome: Adequate for Discharge 04/26/2024 1325 by Espiridion Heft, RN Outcome: Adequate for Discharge   Problem: Coping: Goal: Ability to identify and develop effective coping behavior will improve 04/26/2024 1326 by Espiridion Heft, RN Outcome: Adequate for Discharge 04/26/2024 1325 by Espiridion Heft, RN Outcome: Adequate for Discharge   Problem: Self-Concept: Goal: Ability to identify factors that promote anxiety will improve 04/26/2024 1326 by Espiridion Heft, RN Outcome: Adequate for Discharge 04/26/2024 1325 by Espiridion Heft, RN Outcome: Adequate for Discharge Goal: Level of anxiety will decrease 04/26/2024 1326 by Espiridion Heft, RN Outcome: Adequate for Discharge 04/26/2024 1325 by Espiridion Heft, RN Outcome: Adequate for Discharge Goal: Ability to modify response to factors that promote anxiety will  improve 04/26/2024 1326 by Espiridion Heft, RN Outcome: Adequate for Discharge 04/26/2024 1325 by Espiridion Heft, RN Outcome: Adequate for Discharge

## 2024-04-29 ENCOUNTER — Ambulatory Visit: Payer: Self-pay | Admitting: Gastroenterology

## 2024-04-29 ENCOUNTER — Ambulatory Visit (HOSPITAL_COMMUNITY)
Admission: RE | Admit: 2024-04-29 | Discharge: 2024-04-29 | Disposition: A | Discharge status: Home / Self Care | Source: Ambulatory Visit | Attending: Gastroenterology | Admitting: Gastroenterology

## 2024-04-29 DIAGNOSIS — K8689 Other specified diseases of pancreas: Secondary | ICD-10-CM | POA: Diagnosis present

## 2024-04-29 DIAGNOSIS — I160 Hypertensive urgency: Secondary | ICD-10-CM | POA: Diagnosis present

## 2024-04-29 LAB — POCT I-STAT CREATININE: Creatinine, Ser: 2 mg/dL — ABNORMAL HIGH (ref 0.61–1.24)

## 2024-04-29 MED ORDER — IOHEXOL 300 MG/ML  SOLN
75.0000 mL | Freq: Once | INTRAMUSCULAR | Status: AC | PRN
  Administered 2024-04-29: 75 mL via INTRAVENOUS

## 2024-05-03 ENCOUNTER — Ambulatory Visit: Payer: Self-pay | Admitting: Gastroenterology

## 2024-05-05 ENCOUNTER — Other Ambulatory Visit: Payer: Self-pay

## 2024-06-27 ENCOUNTER — Emergency Department (HOSPITAL_COMMUNITY)

## 2024-06-27 ENCOUNTER — Emergency Department (HOSPITAL_COMMUNITY)
Admission: EM | Admit: 2024-06-27 | Discharge: 2024-06-27 | Disposition: A | Discharge status: Home / Self Care | Attending: Emergency Medicine | Admitting: Emergency Medicine

## 2024-06-27 ENCOUNTER — Other Ambulatory Visit: Payer: Self-pay

## 2024-06-27 ENCOUNTER — Encounter (HOSPITAL_COMMUNITY): Payer: Self-pay

## 2024-06-27 DIAGNOSIS — I739 Peripheral vascular disease, unspecified: Secondary | ICD-10-CM | POA: Diagnosis not present

## 2024-06-27 DIAGNOSIS — Z794 Long term (current) use of insulin: Secondary | ICD-10-CM | POA: Insufficient documentation

## 2024-06-27 DIAGNOSIS — M545 Low back pain, unspecified: Secondary | ICD-10-CM | POA: Diagnosis not present

## 2024-06-27 DIAGNOSIS — Z7982 Long term (current) use of aspirin: Secondary | ICD-10-CM | POA: Insufficient documentation

## 2024-06-27 DIAGNOSIS — M79672 Pain in left foot: Secondary | ICD-10-CM | POA: Diagnosis present

## 2024-06-27 LAB — CBC WITH DIFFERENTIAL/PLATELET
Abs Immature Granulocytes: 0.01 K/uL (ref 0.00–0.07)
Basophils Absolute: 0 K/uL (ref 0.0–0.1)
Basophils Relative: 0 %
Eosinophils Absolute: 0.1 K/uL (ref 0.0–0.5)
Eosinophils Relative: 1 %
HCT: 39.5 % (ref 39.0–52.0)
Hemoglobin: 13.1 g/dL (ref 13.0–17.0)
Immature Granulocytes: 0 %
Lymphocytes Relative: 19 %
Lymphs Abs: 1.2 K/uL (ref 0.7–4.0)
MCH: 32.3 pg (ref 26.0–34.0)
MCHC: 33.2 g/dL (ref 30.0–36.0)
MCV: 97.3 fL (ref 80.0–100.0)
Monocytes Absolute: 0.4 K/uL (ref 0.1–1.0)
Monocytes Relative: 7 %
Neutro Abs: 4.3 K/uL (ref 1.7–7.7)
Neutrophils Relative %: 73 %
Platelets: 208 K/uL (ref 150–400)
RBC: 4.06 MIL/uL — ABNORMAL LOW (ref 4.22–5.81)
RDW: 15.9 % — ABNORMAL HIGH (ref 11.5–15.5)
WBC: 6 K/uL (ref 4.0–10.5)
nRBC: 0 % (ref 0.0–0.2)

## 2024-06-27 LAB — COMPREHENSIVE METABOLIC PANEL WITH GFR
ALT: 11 U/L (ref 0–44)
AST: 21 U/L (ref 15–41)
Albumin: 2.8 g/dL — ABNORMAL LOW (ref 3.5–5.0)
Alkaline Phosphatase: 43 U/L (ref 38–126)
Anion gap: 9 (ref 5–15)
BUN: 15 mg/dL (ref 6–20)
CO2: 25 mmol/L (ref 22–32)
Calcium: 8.5 mg/dL — ABNORMAL LOW (ref 8.9–10.3)
Chloride: 107 mmol/L (ref 98–111)
Creatinine, Ser: 2.28 mg/dL — ABNORMAL HIGH (ref 0.61–1.24)
GFR, Estimated: 33 mL/min — ABNORMAL LOW (ref >60)
Glucose, Bld: 97 mg/dL (ref 70–99)
Potassium: 4.8 mmol/L (ref 3.5–5.1)
Sodium: 141 mmol/L (ref 135–145)
Total Bilirubin: 0.4 mg/dL (ref 0.0–1.2)
Total Protein: 5.9 g/dL — ABNORMAL LOW (ref 6.5–8.1)

## 2024-06-27 LAB — CK: Total CK: 346 U/L (ref 49–397)

## 2024-06-27 MED ORDER — ASPIRIN 81 MG PO CHEW: 81.0000 mg | CHEWABLE_TABLET | Freq: Every day | ORAL | 3 refills | Status: DC

## 2024-06-27 MED ORDER — CLOPIDOGREL BISULFATE 75 MG PO TABS: 75.0000 mg | ORAL_TABLET | Freq: Every day | ORAL | 0 refills | Status: DC

## 2024-06-27 MED ORDER — IOHEXOL 350 MG/ML SOLN
100.0000 mL | Freq: Once | INTRAVENOUS | Status: AC | PRN
  Administered 2024-06-27: 100 mL via INTRAVENOUS

## 2024-06-27 NOTE — ED Provider Notes (Signed)
 [place] EMERGENCY DEPARTMENT AT [facility] Provider Note   CSN: [id number] Arrival date & time: 06/27/24  1255     Patient presents with: Foot Pain   Taylor Obrien is a 58 y.o. male.   Patient is a 58 year old male who presents emergency department the chief complaint of pain to his left foot as well as difficulty with moving the left foot.  He notes that this has been progressing over the past month.  He does note that he has had some associated lower back pain.  He does note that he has a history of a CVA approximately 10 years ago.  He denies any associated chest pain, shortness of breath.  He denies any urinary bowel incontinence, saddle paresthesias.  He does note that he has been utilizing a wheelchair secondary to his difficulty with moving.  He denies any associated redness to the lower extremity but has had some associated swelling to the foot.   Foot Pain       Prior to Admission medications   Medication Sig Start Date End Date Taking? Authorizing Provider  amLODipine  (NORVASC ) 10 MG tablet Take 1 tablet (10 mg total) by mouth daily. 01/19/16   Kirsteins, Prentice BRAVO, MD  aspirin  325 MG tablet Take 1 tablet (325 mg total) by mouth daily. 12/20/15   Love, Sharlet RAMAN, PA-C  atorvastatin  (LIPITOR ) 80 MG tablet Take 1 tablet (80 mg total) by mouth daily. 04/26/24   Jadapalle, Sree, MD  cloNIDine  (CATAPRES  - DOSED IN MG/24 HR) 0.3 mg/24hr patch Place 1 patch (0.3 mg total) onto the skin once a week. 04/29/24   Jadapalle, Sree, MD  empagliflozin  (JARDIANCE ) 25 MG TABS tablet Take 25 mg by mouth daily.     [provider]  escitalopram  (LEXAPRO ) 10 MG tablet Take 1 tablet (10 mg total) by mouth daily. 04/26/24   Jadapalle, Sree, MD  fenofibrate  (TRICOR ) 145 MG tablet Take 145 mg by mouth daily. 04/10/22   [provider]  gabapentin  (NEURONTIN ) 300 MG capsule Take 1 capsule (300 mg total) by mouth at bedtime. 04/26/24   Jadapalle, Sree, MD  hydrALAZINE   (APRESOLINE ) 25 MG tablet Take 1 tablet (25 mg total) by mouth 2 (two) times daily. 04/21/24   Evonnie Lenis, MD  insulin  aspart (NOVOLOG ) 100 UNIT/ML injection Inject 0-5 Units into the skin at bedtime. 04/26/24   Jadapalle, Sree, MD  insulin  aspart (NOVOLOG ) 100 UNIT/ML injection Inject 0-9 Units into the skin 3 (three) times daily with meals. 04/26/24   Donnelly Mellow, MD  labetalol  (NORMODYNE ) 300 MG tablet Take 1 tablet (300 mg total) by mouth 2 (two) times daily. 04/26/24   Donnelly Mellow, MD  lidocaine  (LIDODERM ) 5 % Place 2 patches onto the skin daily. Remove & Discard patch within 12 hours or as directed by MD 04/26/24   Jadapalle, Sree, MD  melatonin 5 MG TABS Take 0.5 tablets (2.5 mg total) by mouth at bedtime. 04/26/24   Jadapalle, Sree, MD  Multiple Vitamin (MULTIVITAMIN WITH MINERALS) TABS tablet Take 1 tablet by mouth daily. 04/26/24   Jadapalle, Sree, MD  nicotine  (NICODERM CQ  - DOSED IN MG/24 HOURS) 21 mg/24hr patch Place 1 patch (21 mg total) onto the skin daily at 6 (six) AM. 04/26/24   Donnelly Mellow, MD  oxyCODONE  (OXY IR/ROXICODONE ) 5 MG immediate release tablet Take 1 tablet (5 mg total) by mouth every 8 (eight) hours as needed for moderate pain (pain score 4-6). 04/26/24   Jadapalle, Sree, MD  pantoprazole  (PROTONIX )  40 MG tablet Take 1 tablet (40 mg total) by mouth at bedtime. 01/19/16   Kirsteins, Prentice BRAVO, MD  VASCEPA  1 g capsule Take 2 g by mouth 2 (two) times daily. 03/12/22   [provider]    Allergies: Ibuprofen  and Oxcarbazepine    Review of Systems  Musculoskeletal:        Pain, numbness, paresthesias to left foot  All other systems reviewed and are negative.   Updated Vital Signs BP (!) 176/100 (BP Location: Right Arm)   Pulse 81   Temp 98.3 F (36.8 C)   Resp 15   SpO2 98%   Physical Exam Vitals and nursing note reviewed.  Constitutional:      Appearance: Normal appearance.  HENT:     Head: Normocephalic and atraumatic.     Nose: Nose normal.      Mouth/Throat:     Mouth: Mucous membranes are moist.  Eyes:     Extraocular Movements: Extraocular movements intact.     Conjunctiva/sclera: Conjunctivae normal.     Pupils: Pupils are equal, round, and reactive to light.  Cardiovascular:     Rate and Rhythm: Normal rate and regular rhythm.     Pulses: Normal pulses.     Heart sounds: Normal heart sounds. No murmur heard.    No gallop.  Pulmonary:     Effort: Pulmonary effort is normal. No respiratory distress.     Breath sounds: Normal breath sounds. No stridor. No wheezing, rhonchi or rales.  Abdominal:     General: Abdomen is flat. Bowel sounds are normal. There is no distension.     Palpations: Abdomen is soft. There is no mass.     Tenderness: There is no abdominal tenderness. There is no guarding.     Hernia: No hernia is present.  Musculoskeletal:        General: Normal range of motion.     Cervical back: Normal range of motion and neck supple.     Comments: Edema noted to left foot, peripheral pulses minimal, sensation intact, plantarflexion intact with minimal dorsiflexion, nontender palpation directly over foot, ankle, knee or hip, no obvious deformity or bruising, no skin breakdown ulceration, lacerations or abrasions  Skin:    General: Skin is warm and dry.  Neurological:     General: No focal deficit present.     Mental Status: He is alert and oriented to person, place, and time. Mental status is at baseline.     Cranial Nerves: No cranial nerve deficit.     Sensory: No sensory deficit.     Coordination: Coordination normal.  Psychiatric:        Mood and Affect: Mood normal.        Behavior: Behavior normal.        Thought Content: Thought content normal.        Judgment: Judgment normal.     (all labs ordered are listed, but only abnormal results are displayed) Labs Reviewed  COMPREHENSIVE METABOLIC PANEL WITH GFR  CBC WITH DIFFERENTIAL/PLATELET  CK    EKG: None  Radiology: No results  found.   Procedures   Medications Ordered in the ED - No data to display                                  Medical Decision Making Amount and/or Complexity of Data Reviewed Labs: ordered. Radiology: ordered.  Risk OTC drugs. Prescription drug management.  This patient presents to the ED for concern of pain to left foot and difficulty with range of motion differential diagnosis includes peripheral arterial disease, cauda equina syndrome, vertebral osteomyelitis, epidural abscess, CVA, TIA, DVT    Additional history obtained:  Additional history obtained from none External records from outside source obtained and reviewed including none   Lab Tests:  I Ordered, and personally interpreted labs.  The pertinent results include: No leukocytosis, no anemia, creatinine at baseline, normal electrolytes, normal liver function, negative CK   Imaging Studies ordered:  I ordered imaging studies including MRI brain, MRI lumbar spine, CTA aortofemoral runoff I independently visualized and interpreted imaging which showed no acute ischemic changes within the brain, no acute changes on MRI of lumbar spine, peripheral vascular disease noted on CTA with runoff I agree with the radiologist interpretation   Medicines ordered and prescription drug management:  I ordered medication including aspirin  and Plavix  for PAD Reevaluation of the patient after these medicines showed that the patient improved I have reviewed the patients home medicines and have made adjustments as needed   Problem List / ED Course:  Patient is doing well at this time and is stable for discharge home.  Discussed CTA findings with Dr. Silver with vascular surgery who did recommend placing the patient on aspirin  and Plavix  and recommended close follow-up on an outpatient basis with vascular surgery.  He does not appear to have an acute occlusion at this point but do suspect he is suffering from claudication.  Low  suspicion for DVT at this point.  Patient does still have warm bilateral lower extremities.  He has no indication for cellulitis or abscess summation at this point.  Do not suspect septic joint or gout.  Blood work is otherwise been unremarkable at this point and kidney function is at his baseline.  The importance of close follow-up with vascular surgery was discussed as well as strict turn precautions for any new or worsening symptoms.  Patient voiced understanding to the plan and had no additional questions.   Social Determinants of Health:  None        Final diagnoses:  None    ED Discharge Orders     None          Daralene Lonni JONETTA DEVONNA 06/27/24 1731    Cleotilde Rogue, MD 06/29/24 [phone]

## 2024-06-27 NOTE — ED Triage Notes (Signed)
 Patient present via POV, C/o left foot pain for several weeks continue to get worse with muscle spasms. Denies any injury to foot.

## 2024-06-27 NOTE — Discharge Instructions (Signed)
 Please follow-up closely with vascular surgery on an outpatient basis.  Please start taking the aspirin  and Plavix  as prescribed.  Please continue taking all of your other medications as directed including your blood pressure medicines.  Return to the emergency department immediately for any new or worsening symptoms.

## 2024-06-30 ENCOUNTER — Other Ambulatory Visit: Payer: Self-pay

## 2024-06-30 DIAGNOSIS — I739 Peripheral vascular disease, unspecified: Secondary | ICD-10-CM
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
## 2024-07-01 ENCOUNTER — Encounter: Payer: Self-pay | Admitting: Vascular Surgery

## 2024-07-01 ENCOUNTER — Ambulatory Visit: Admitting: Vascular Surgery

## 2024-07-01 ENCOUNTER — Ambulatory Visit (HOSPITAL_COMMUNITY)
Admission: RE | Admit: 2024-07-01 | Discharge: 2024-07-01 | Disposition: A | Discharge status: Home / Self Care | Source: Ambulatory Visit | Attending: Vascular Surgery | Admitting: Vascular Surgery

## 2024-07-01 VITALS — BP 173/103 | HR 74 | Temp 98.3°F | Resp 20

## 2024-07-01 DIAGNOSIS — M545 Low back pain, unspecified: Secondary | ICD-10-CM

## 2024-07-01 DIAGNOSIS — R600 Localized edema: Secondary | ICD-10-CM

## 2024-07-01 DIAGNOSIS — M79605 Pain in left leg: Secondary | ICD-10-CM

## 2024-07-01 DIAGNOSIS — I739 Peripheral vascular disease, unspecified: Secondary | ICD-10-CM | POA: Insufficient documentation

## 2024-07-01 LAB — VAS US ABI WITH/WO TBI
Left ABI: 0.87
Right ABI: 0.81

## 2024-07-12 ENCOUNTER — Encounter: Payer: Self-pay | Admitting: Gastroenterology

## 2024-10-30 ENCOUNTER — Inpatient Hospital Stay (HOSPITAL_COMMUNITY)
Admission: EM | Admit: 2024-10-30 | Discharge: 2024-11-06 | DRG: 280 | Disposition: A | Discharge status: Home / Self Care | Attending: Internal Medicine | Admitting: Internal Medicine

## 2024-10-30 ENCOUNTER — Other Ambulatory Visit: Payer: Self-pay

## 2024-10-30 ENCOUNTER — Encounter (HOSPITAL_COMMUNITY): Payer: Self-pay

## 2024-10-30 ENCOUNTER — Emergency Department (HOSPITAL_COMMUNITY)

## 2024-10-30 DIAGNOSIS — Z5941 Food insecurity: Secondary | ICD-10-CM

## 2024-10-30 DIAGNOSIS — Z79899 Other long term (current) drug therapy: Secondary | ICD-10-CM

## 2024-10-30 DIAGNOSIS — Z91411 Personal history of adult psychological abuse: Secondary | ICD-10-CM

## 2024-10-30 DIAGNOSIS — R5381 Other malaise: Secondary | ICD-10-CM | POA: Diagnosis present

## 2024-10-30 DIAGNOSIS — I5043 Acute on chronic combined systolic (congestive) and diastolic (congestive) heart failure: Secondary | ICD-10-CM | POA: Diagnosis present

## 2024-10-30 DIAGNOSIS — Z813 Family history of other psychoactive substance abuse and dependence: Secondary | ICD-10-CM

## 2024-10-30 DIAGNOSIS — I5021 Acute systolic (congestive) heart failure: Secondary | ICD-10-CM

## 2024-10-30 DIAGNOSIS — Z6835 Body mass index (BMI) 35.0-35.9, adult: Secondary | ICD-10-CM

## 2024-10-30 DIAGNOSIS — E1122 Type 2 diabetes mellitus with diabetic chronic kidney disease: Principal | ICD-10-CM | POA: Diagnosis present

## 2024-10-30 DIAGNOSIS — Z833 Family history of diabetes mellitus: Secondary | ICD-10-CM

## 2024-10-30 DIAGNOSIS — T465X6A Underdosing of other antihypertensive drugs, initial encounter: Secondary | ICD-10-CM | POA: Diagnosis present

## 2024-10-30 DIAGNOSIS — F32A Depression, unspecified: Secondary | ICD-10-CM | POA: Diagnosis present

## 2024-10-30 DIAGNOSIS — I509 Heart failure, unspecified: Secondary | ICD-10-CM

## 2024-10-30 DIAGNOSIS — J9601 Acute respiratory failure with hypoxia: Secondary | ICD-10-CM | POA: Diagnosis present

## 2024-10-30 DIAGNOSIS — I959 Hypotension, unspecified: Secondary | ICD-10-CM | POA: Diagnosis not present

## 2024-10-30 DIAGNOSIS — K5900 Constipation, unspecified: Secondary | ICD-10-CM | POA: Diagnosis present

## 2024-10-30 DIAGNOSIS — I161 Hypertensive emergency: Principal | ICD-10-CM | POA: Diagnosis present

## 2024-10-30 DIAGNOSIS — Z8249 Family history of ischemic heart disease and other diseases of the circulatory system: Secondary | ICD-10-CM

## 2024-10-30 DIAGNOSIS — Z5982 Transportation insecurity: Secondary | ICD-10-CM

## 2024-10-30 DIAGNOSIS — I69354 Hemiplegia and hemiparesis following cerebral infarction affecting left non-dominant side: Secondary | ICD-10-CM

## 2024-10-30 DIAGNOSIS — Z7982 Long term (current) use of aspirin: Secondary | ICD-10-CM

## 2024-10-30 DIAGNOSIS — I130 Hypertensive heart and chronic kidney disease with heart failure and stage 1 through stage 4 chronic kidney disease, or unspecified chronic kidney disease: Secondary | ICD-10-CM | POA: Diagnosis present

## 2024-10-30 DIAGNOSIS — E785 Hyperlipidemia, unspecified: Secondary | ICD-10-CM | POA: Diagnosis present

## 2024-10-30 DIAGNOSIS — N179 Acute kidney failure, unspecified: Secondary | ICD-10-CM | POA: Diagnosis present

## 2024-10-30 DIAGNOSIS — Z1152 Encounter for screening for COVID-19: Secondary | ICD-10-CM

## 2024-10-30 DIAGNOSIS — K219 Gastro-esophageal reflux disease without esophagitis: Secondary | ICD-10-CM | POA: Diagnosis present

## 2024-10-30 DIAGNOSIS — D631 Anemia in chronic kidney disease: Secondary | ICD-10-CM | POA: Diagnosis present

## 2024-10-30 DIAGNOSIS — Z83438 Family history of other disorder of lipoprotein metabolism and other lipidemia: Secondary | ICD-10-CM

## 2024-10-30 DIAGNOSIS — M549 Dorsalgia, unspecified: Secondary | ICD-10-CM | POA: Diagnosis present

## 2024-10-30 DIAGNOSIS — F1010 Alcohol abuse, uncomplicated: Secondary | ICD-10-CM | POA: Diagnosis present

## 2024-10-30 DIAGNOSIS — Z5948 Other specified lack of adequate food: Secondary | ICD-10-CM

## 2024-10-30 DIAGNOSIS — E66811 Obesity, class 1: Secondary | ICD-10-CM | POA: Diagnosis present

## 2024-10-30 DIAGNOSIS — F17210 Nicotine dependence, cigarettes, uncomplicated: Secondary | ICD-10-CM | POA: Diagnosis present

## 2024-10-30 DIAGNOSIS — Z7902 Long term (current) use of antithrombotics/antiplatelets: Secondary | ICD-10-CM

## 2024-10-30 DIAGNOSIS — E1151 Type 2 diabetes mellitus with diabetic peripheral angiopathy without gangrene: Secondary | ICD-10-CM | POA: Diagnosis present

## 2024-10-30 DIAGNOSIS — Z5320 Procedure and treatment not carried out because of patient's decision for unspecified reasons: Secondary | ICD-10-CM | POA: Diagnosis present

## 2024-10-30 DIAGNOSIS — I5041 Acute combined systolic (congestive) and diastolic (congestive) heart failure: Secondary | ICD-10-CM

## 2024-10-30 DIAGNOSIS — G4733 Obstructive sleep apnea (adult) (pediatric): Secondary | ICD-10-CM | POA: Diagnosis present

## 2024-10-30 DIAGNOSIS — I21A1 Myocardial infarction type 2: Secondary | ICD-10-CM | POA: Diagnosis present

## 2024-10-30 DIAGNOSIS — Z993 Dependence on wheelchair: Secondary | ICD-10-CM

## 2024-10-30 DIAGNOSIS — N1832 Chronic kidney disease, stage 3b: Secondary | ICD-10-CM | POA: Diagnosis present

## 2024-10-30 DIAGNOSIS — Z7984 Long term (current) use of oral hypoglycemic drugs: Secondary | ICD-10-CM

## 2024-10-30 DIAGNOSIS — G8929 Other chronic pain: Secondary | ICD-10-CM | POA: Diagnosis present

## 2024-10-30 LAB — COMPREHENSIVE METABOLIC PANEL WITH GFR
ALT: 16 U/L (ref 0–44)
AST: 35 U/L (ref 15–41)
Albumin: 3.1 g/dL — ABNORMAL LOW (ref 3.5–5.0)
Alkaline Phosphatase: 70 U/L (ref 38–126)
Anion gap: 10 (ref 5–15)
BUN: 24 mg/dL — ABNORMAL HIGH (ref 6–20)
CO2: 25 mmol/L (ref 22–32)
Calcium: 8.7 mg/dL — ABNORMAL LOW (ref 8.9–10.3)
Chloride: 108 mmol/L (ref 98–111)
Creatinine, Ser: 3.61 mg/dL — ABNORMAL HIGH (ref 0.61–1.24)
GFR, Estimated: 19 mL/min — ABNORMAL LOW (ref >60)
Glucose, Bld: 116 mg/dL — ABNORMAL HIGH (ref 70–99)
Potassium: 4.4 mmol/L (ref 3.5–5.1)
Sodium: 143 mmol/L (ref 135–145)
Total Bilirubin: 0.2 mg/dL (ref 0.0–1.2)
Total Protein: 6.3 g/dL — ABNORMAL LOW (ref 6.5–8.1)

## 2024-10-30 LAB — CBC WITH DIFFERENTIAL/PLATELET
Abs Immature Granulocytes: 0.02 K/uL (ref 0.00–0.07)
Basophils Absolute: 0 K/uL (ref 0.0–0.1)
Basophils Relative: 0 %
Eosinophils Absolute: 0.1 K/uL (ref 0.0–0.5)
Eosinophils Relative: 1 %
HCT: 43.3 % (ref 39.0–52.0)
Hemoglobin: 14.1 g/dL (ref 13.0–17.0)
Immature Granulocytes: 0 %
Lymphocytes Relative: 19 %
Lymphs Abs: 1.4 K/uL (ref 0.7–4.0)
MCH: 31.8 pg (ref 26.0–34.0)
MCHC: 32.6 g/dL (ref 30.0–36.0)
MCV: 97.7 fL (ref 80.0–100.0)
Monocytes Absolute: 0.4 K/uL (ref 0.1–1.0)
Monocytes Relative: 6 %
Neutro Abs: 5.3 K/uL (ref 1.7–7.7)
Neutrophils Relative %: 74 %
Platelets: 249 K/uL (ref 150–400)
RBC: 4.43 MIL/uL (ref 4.22–5.81)
RDW: 14.6 % (ref 11.5–15.5)
WBC: 7.2 K/uL (ref 4.0–10.5)
nRBC: 0 % (ref 0.0–0.2)

## 2024-10-30 LAB — TROPONIN T, HIGH SENSITIVITY: Troponin T High Sensitivity: 197 ng/L (ref 0–19)

## 2024-10-30 LAB — BLOOD GAS, VENOUS
Acid-base deficit: 0.5 mmol/L (ref 0.0–2.0)
Bicarbonate: 24.2 mmol/L (ref 20.0–28.0)
Drawn by: 1517
O2 Saturation: 74.7 %
Patient temperature: 37.4
pCO2, Ven: 40 mmHg — ABNORMAL LOW (ref 44–60)
pH, Ven: 7.39 (ref 7.25–7.43)
pO2, Ven: 44 mmHg (ref 32–45)

## 2024-10-30 LAB — PRO BRAIN NATRIURETIC PEPTIDE: Pro Brain Natriuretic Peptide: 6550 pg/mL — ABNORMAL HIGH (ref <300.0)

## 2024-10-30 MED ORDER — ASPIRIN 81 MG PO CHEW
324.0000 mg | CHEWABLE_TABLET | Freq: Once | ORAL | Status: AC
  Administered 2024-10-30: 324 mg via ORAL
  Filled 2024-10-30: qty 4

## 2024-10-30 MED ORDER — NITROGLYCERIN IN D5W 200-5 MCG/ML-% IV SOLN
5.0000 ug/min | INTRAVENOUS | Status: DC
  Administered 2024-10-30: 5 ug/min via INTRAVENOUS
  Administered 2024-10-31: 105 ug/min via INTRAVENOUS
  Filled 2024-10-30 (×2): qty 250

## 2024-10-30 MED ORDER — HYDROCODONE-ACETAMINOPHEN 5-325 MG PO TABS
1.0000 | ORAL_TABLET | Freq: Once | ORAL | Status: AC
  Administered 2024-10-30: 1 via ORAL
  Filled 2024-10-30: qty 1

## 2024-10-30 MED ORDER — HYDRALAZINE HCL 20 MG/ML IJ SOLN
10.0000 mg | Freq: Once | INTRAMUSCULAR | Status: AC
  Administered 2024-10-30: 10 mg via INTRAVENOUS
  Filled 2024-10-30: qty 1

## 2024-10-30 MED ORDER — LIDOCAINE 5 % EX PTCH
1.0000 | MEDICATED_PATCH | Freq: Once | CUTANEOUS | Status: AC
  Administered 2024-10-30: 1 via TRANSDERMAL
  Filled 2024-10-30: qty 1

## 2024-10-30 MED ORDER — FUROSEMIDE 10 MG/ML IJ SOLN
80.0000 mg | Freq: Once | INTRAMUSCULAR | Status: AC
  Administered 2024-10-30: 80 mg via INTRAVENOUS
  Filled 2024-10-30: qty 8

## 2024-10-30 NOTE — ED Provider Notes (Incomplete)
 [facility] AT [facility] Provider Note  CSN: [id number] Arrival date & time: 10/30/24 2042  Chief Complaint(s) Leg Swelling  HPI Taylor Obrien is a 58 y.o. male with past medical history as below, significant for alcohol use, alcoholic pancreatitis, DM, hypertension, GERD, PVD, CVA, MDD, CKD 3 who presents to the ED with complaint of dyspnea leg swelling  Patient reports difficulty breathing onset acutely this evening, significant worsening when he attempts to change position on chair/bed or lie down flat.  Worsening pedal edema over the past 24 hours b/l.  Has run out of his blood pressure medications and has not had any today, no antihypertensives in approximately 3 weeks per pt.  Having some chest tightness, worsening.  No syncope or near syncope.  No nausea or vomiting, or diaphoresis.  Denies similar symptoms in the past.  Denies diagnosis of CHF. No recent diet change. Here w/ family  Past Medical History Past Medical History:  Diagnosis Date   Acute alcoholic pancreatitis    Arthritis    Chronic pain    Depression    Diabetes mellitus without complication (HCC)    Essential hypertension, benign    GERD (gastroesophageal reflux disease)    Headache    HTN (hypertension) 11/27/2015   Hyperlipidemia    Peripheral vascular disease    Sleep apnea    could not tolerate CPAP   Stroke (HCC)    2016   Patient Active Problem List   Diagnosis Date Noted   Hypertensive emergency 10/31/2024   Suicidal ideation 04/21/2024   MDD (major depressive disorder), recurrent severe, 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   Intentional diphenhydramine  overdose (HCC) 04/19/2024   Pancreatic duct dilated 01/31/2024   Hypertensive urgency 01/30/2024   Chronic kidney disease, stage 3b (HCC) 01/30/2024   Type 2 diabetes mellitus with hyperglycemia (HCC) 01/30/2024   History of right ACA stroke 01/30/2024   Anxiety 01/30/2024   Fall at home, initial encounter  06/18/2023   Acute kidney injury superimposed on chronic kidney disease 06/18/2023   Acute pancreatitis 07/19/2022   GERD (gastroesophageal reflux disease)    Iron deficiency anemia    Constipation    Abdominal pain 06/14/2022   Nausea and vomiting 06/14/2022   Elevated lipase 06/14/2022   Hypoalbuminemia due to protein-calorie malnutrition 06/14/2022   Stage 3a chronic kidney disease (CKD) (HCC) 06/04/2022   Anemia    Acute pancreatitis without infection or necrosis    Transaminasemia 04/29/2022   Class 1 obesity 04/29/2022   Thrombocytosis 04/26/2022   Acute alcoholic pancreatitis 04/26/2022   Diabetes mellitus (HCC) 04/24/2022   Post-operative state 01/24/2018   Spastic hemiplegia affecting nondominant side (HCC) 06/03/2016   Dysuria    OSA (obstructive sleep apnea)    Hypokalemia    Elevated blood pressure    Hemiparesis affecting left side as late effect of stroke (HCC)    Epistaxis    HTN (hypertension) 11/27/2015   Acute renal failure superimposed on stage 3a chronic kidney disease (HCC) 11/27/2015   Hemiplegia and hemiparesis following unspecified cerebrovascular disease affecting left non-dominant side (HCC) 11/23/2015   Gait disturbance, post-stroke 11/23/2015   Cerebrovascular accident (CVA) due to occlusion of right anterior cerebral artery (HCC)    Essential hypertension    Depression    Chronic pain syndrome    ETOH abuse    Marijuana abuse    Cerebrovascular accident (CVA) due to thrombosis of right carotid artery (HCC)    Malignant hypertension    Mixed hyperlipidemia  Cerebral infarction (HCC) 11/18/2015   Stroke (cerebrum) (HCC) 11/18/2015   Chest pain 10/09/2012   Chronic back pain 10/09/2012   Tobacco abuse 10/09/2012   Hypertensive heart disease 08/31/2012   Accelerated hypertension 08/31/2012   Precordial pain 08/31/2012   Home Medication(s) Prior to Admission medications   Medication Sig Start Date End Date Taking? Authorizing Provider   amLODipine  (NORVASC ) 10 MG tablet Take 1 tablet (10 mg total) by mouth daily. 01/19/16   Kirsteins, Prentice BRAVO, MD  aspirin  81 MG chewable tablet Chew 1 tablet (81 mg total) by mouth daily. 06/27/24   Daralene Lonni BIRCH, PA-C  atorvastatin  (LIPITOR ) 80 MG tablet Take 1 tablet (80 mg total) by mouth daily. 04/26/24   Jadapalle, Sree, MD  cloNIDine  (CATAPRES  - DOSED IN MG/24 HR) 0.3 mg/24hr patch Place 1 patch (0.3 mg total) onto the skin once a week. 04/29/24   Jadapalle, Sree, MD  clopidogrel  (PLAVIX ) 75 MG tablet Take 1 tablet (75 mg total) by mouth daily. 06/27/24   Daralene Lonni BIRCH, PA-C  empagliflozin  (JARDIANCE ) 25 MG TABS tablet Take 25 mg by mouth daily.     [provider]  escitalopram  (LEXAPRO ) 10 MG tablet Take 1 tablet (10 mg total) by mouth daily. 04/26/24   Jadapalle, Sree, MD  fenofibrate  (TRICOR ) 145 MG tablet Take 145 mg by mouth daily. 04/10/22   [provider]  gabapentin  (NEURONTIN ) 300 MG capsule Take 1 capsule (300 mg total) by mouth at bedtime. 04/26/24   Donnelly Mellow, MD  hydrALAZINE  (APRESOLINE ) 25 MG tablet Take 1 tablet (25 mg total) by mouth 2 (two) times daily. 04/21/24   Evonnie Lenis, MD  insulin  aspart (NOVOLOG ) 100 UNIT/ML injection Inject 0-5 Units into the skin at bedtime. 04/26/24   Jadapalle, Sree, MD  insulin  aspart (NOVOLOG ) 100 UNIT/ML injection Inject 0-9 Units into the skin 3 (three) times daily with meals. 04/26/24   Jadapalle, Sree, MD  labetalol  (NORMODYNE ) 300 MG tablet Take 1 tablet (300 mg total) by mouth 2 (two) times daily. 04/26/24   Donnelly Mellow, MD  lidocaine  (LIDODERM ) 5 % Place 2 patches onto the skin daily. Remove & Discard patch within 12 hours or as directed by MD 04/26/24   Jadapalle, Sree, MD  melatonin 5 MG TABS Take 0.5 tablets (2.5 mg total) by mouth at bedtime. 04/26/24   Jadapalle, Sree, MD  Multiple Vitamin (MULTIVITAMIN WITH MINERALS) TABS tablet Take 1 tablet by mouth daily. 04/26/24   Jadapalle, Sree, MD  nicotine   (NICODERM CQ  - DOSED IN MG/24 HOURS) 21 mg/24hr patch Place 1 patch (21 mg total) onto the skin daily at 6 (six) AM. 04/26/24   Donnelly Mellow, MD  oxyCODONE  (OXY IR/ROXICODONE ) 5 MG immediate release tablet Take 1 tablet (5 mg total) by mouth every 8 (eight) hours as needed for moderate pain (pain score 4-6). 04/26/24   Jadapalle, Sree, MD  pantoprazole  (PROTONIX ) 40 MG tablet Take 1 tablet (40 mg total) by mouth at bedtime. 01/19/16   Kirsteins, Prentice BRAVO, MD  VASCEPA  1 g capsule Take 2 g by mouth 2 (two) times daily. 03/12/22   [provider]  Past Surgical History Past Surgical History:  Procedure Laterality Date   BIOPSY  08/19/2022   Procedure: BIOPSY;  Surgeon: Cindie Carlin POUR, DO;  Location: [facility];  Service: Endoscopy;;   CLOSED REDUCTION MANDIBULAR FRACTURE W/ ARCH BARS     + multiple extractions   COLONOSCOPY WITH PROPOFOL  N/A 08/19/2022   Procedure: COLONOSCOPY WITH PROPOFOL ;  Surgeon: Cindie Carlin POUR, DO;  Location: [facility];  Service: Endoscopy;  Laterality: N/A;  9:15am   Condyloma resection     ESOPHAGOGASTRODUODENOSCOPY (EGD) WITH PROPOFOL  N/A 08/19/2022   Procedure: ESOPHAGOGASTRODUODENOSCOPY (EGD) WITH PROPOFOL ;  Surgeon: Cindie Carlin POUR, DO;  Location: [facility];  Service: Endoscopy;  Laterality: N/A;   MULTIPLE EXTRACTIONS WITH ALVEOLOPLASTY Bilateral 01/23/2018   Procedure: DENTAL EXTRACTION OF TEETH NUMBER ONE, TWO, THREE, FOUR, FIVE, SIX, SEVEN, EIGHT, NINE, TEN, ELEVEN, TWELVE, THIRTEEN, FOURTEEN, FIFTEEN, SIXTEEN, SEVENTEEN, TWENTY, TWENTY-ONE, TWENTY-TWO, TWENTY-THREE, TWENTY-FOUR, TWENTY-FIVE, TWENTY-SIX, TWENTY-SEVEN, TWENTY-EIGHT, TWENTY-NINE, THIRTY-TWO WITH ALVEOLOPLASTY;  Surgeon: Sheryle Hamilton, DDS;  Location: [facility] OR;  Service: Oral Surgery;  Lat   POLYPECTOMY  08/19/2022   Procedure: POLYPECTOMY;  Surgeon: Cindie Carlin POUR, DO;  Location: [facility];  Service: Endoscopy;;   RADIOLOGY WITH ANESTHESIA N/A 11/18/2015   Procedure: RADIOLOGY WITH ANESTHESIA;  Surgeon: Thyra Nash, MD;  Location: [facility] OR;  Service: Radiology;  Laterality: N/A;   Removal foreign body right shoulder     Right rotator cuff repair     TEE WITHOUT CARDIOVERSION N/A 11/21/2015   Procedure: TRANSESOPHAGEAL ECHOCARDIOGRAM (TEE);  Surgeon: Ezra GORMAN Shuck, MD;  Location: [facility] ENDOSCOPY;  Service: Cardiovascular;  Laterality: N/A;   TOOTH EXTRACTION N/A 01/24/2018   Procedure: SUTURE ORAL WOUND;  Surgeon: Sheryle Hamilton, DDS;  Location: [facility] OR;  Service: Oral Surgery;  Laterality: N/A;   Family History Family History  Problem Relation Age of Onset   Diabetes Mother    Hypertension Mother    Drug abuse Mother    Hyperlipidemia Mother    Diabetes Father    Hypertension Father    Hyperlipidemia Father    Diabetes Brother    Hypertension Brother     Social History Social History   Tobacco Use   Smoking status: Every Day    Current packs/day: 0.50    Average packs/day: 0.5 packs/day for 30.0 years (15.0 ttl pk-yrs)    Types: Cigarettes   Smokeless tobacco: Never  Vaping Use   Vaping status: Never Used  Substance Use Topics   Alcohol use: Not Currently    Comment: 2 40 oz daily. 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 Stopped liquor 5 years ago.states no etoh since 03/2022   Drug use: Yes    Types: Marijuana    Comment: occassionally   Allergies Ibuprofen  and Oxcarbazepine  Review of Systems A thorough review of systems was obtained and all systems are negative except as noted in the HPI and PMH.   Physical Exam Vital Signs  I have reviewed the triage vital signs BP (!) 208/138   Pulse (!) 101   Temp 99.3 F (37.4 C)   Resp 18   Wt 99.8 kg   SpO2 97%   BMI 35.51 kg/m  Physical Exam Vitals and nursing note reviewed.  Constitutional:      General: He is not in acute distress.    Appearance: He is  well-developed. He is obese.  HENT:     Head: Normocephalic and atraumatic.     Right Ear: External ear normal.  Left Ear: External ear normal.     Mouth/Throat:     Mouth: Mucous membranes are moist.  Eyes:     General: No scleral icterus. Cardiovascular:     Rate and Rhythm: Normal rate and regular rhythm.     Pulses: Normal pulses.     Heart sounds: Normal heart sounds.  Pulmonary:     Effort: Pulmonary effort is normal. Tachypnea present. No respiratory distress.     Breath sounds: Decreased air movement present. Rales present.  Abdominal:     General: Abdomen is flat.     Palpations: Abdomen is soft.     Tenderness: There is no abdominal tenderness.  Musculoskeletal:     Cervical back: No rigidity.     Right lower leg: Edema present.     Left lower leg: Edema present.     Comments: Pedal edema b/l   Skin:    General: Skin is warm and dry.     Capillary Refill: Capillary refill takes less than 2 seconds.  Neurological:     Mental Status: He is alert.  Psychiatric:        Mood and Affect: Mood normal.        Behavior: Behavior normal.     ED Results and Treatments Labs (all labs ordered are listed, but only abnormal results are displayed) Labs Reviewed  PRO BRAIN NATRIURETIC PEPTIDE - Abnormal; Notable for the following components:      Result Value   Pro Brain Natriuretic Peptide 6,550.0 (*)    All other components within normal limits  COMPREHENSIVE METABOLIC PANEL WITH GFR - Abnormal; Notable for the following components:   Glucose, Bld 116 (*)    BUN 24 (*)    Creatinine, Ser 3.61 (*)    Calcium  8.7 (*)    Total Protein 6.3 (*)    Albumin 3.1 (*)    GFR, Estimated 19 (*)    All other components within normal limits  BLOOD GAS, VENOUS - Abnormal; Notable for the following components:   pCO2, Ven 40 (*)    All other components within normal limits  TROPONIN T, HIGH SENSITIVITY - Abnormal; Notable for the following components:   Troponin T High  Sensitivity 197 (*)    All other components within normal limits  RESP PANEL BY RT-PCR (RSV, FLU A&B, COVID)  RVPGX2  CBC WITH DIFFERENTIAL/PLATELET  TROPONIN T, HIGH SENSITIVITY                                                                                                                          Radiology DG Chest Port 1 View Result Date: 10/30/2024 EXAM: 1 VIEW(S) XRAY OF THE CHEST 10/30/2024 09:53:49 PM COMPARISON: Comparison study 01/29/2024. CLINICAL HISTORY: Peripheral edema and shortness of breath. FINDINGS: LUNGS AND PLEURA: Increased vascular congestion is noted with interstitial edema. No infiltrate is noted. No sizable effusion is seen. HEART AND MEDIASTINUM: Cardiac shadow is enlarged. Aortic calcifications are seen. BONES AND SOFT TISSUES: No  bony abnormality is seen. IMPRESSION: 1. Increased vascular congestion with interstitial edema. No sizable effusion or infiltrate. Electronically signed by: Oneil Devonshire MD 10/30/2024 09:56 PM EST RP Workstation: [id number]    Pertinent labs & imaging results that were available during my care of the patient were reviewed by me and considered in my medical decision making (see MDM for details).  Medications Ordered in ED Medications  nitroGLYCERIN  50 mg in dextrose  5 % 250 mL (0.2 mg/mL) infusion (30 mcg/min Intravenous Rate/Dose Change 10/30/24 2352)  lidocaine  (LIDODERM ) 5 % 1 patch (1 patch Transdermal Patch Applied 10/30/24 2221)  hydrALAZINE  (APRESOLINE ) injection 10 mg (10 mg Intravenous Given 10/30/24 2210)  furosemide  (LASIX ) injection 80 mg (80 mg Intravenous Given 10/30/24 2209)  HYDROcodone -acetaminophen  (NORCO/VICODIN) 5-325 MG per tablet 1 tablet (1 tablet Oral Given 10/30/24 2221)  aspirin  chewable tablet 324 mg (324 mg Oral Given 10/30/24 2323)                                                                                                                                     Procedures .Critical Care  Performed by: Elnor Jayson LABOR, DO Authorized by: Elnor Jayson LABOR, DO   Critical care provider statement:    Critical care time (minutes):  50   Critical care time was exclusive of:  Separately billable procedures and treating other patients   Critical care was necessary to treat or prevent imminent or life-threatening deterioration of the following conditions:  Cardiac failure and respiratory failure   Critical care was time spent personally by me on the following activities:  Development of treatment plan with patient or surrogate, discussions with consultants, evaluation of patient's response to treatment, examination of patient, ordering and review of laboratory studies, ordering and review of radiographic studies, ordering and performing treatments and interventions, pulse oximetry, re-evaluation of patient's condition, review of old charts and obtaining history from patient or surrogate   Care discussed with: admitting provider     (including critical care time)  Medical Decision Making / ED Course    Medical Decision Making:    Taylor Obrien is a 58 y.o. male with past medical history as below, significant for alcohol use, alcoholic pancreatitis, DM, hypertension, GERD, PVD, CVA, MDD, CKD 3 who presents to the ED with complaint of dyspnea leg swelling. The complaint involves an extensive differential diagnosis and also carries with it a high risk of complications and morbidity.  Serious etiology was considered. Ddx includes but is not limited to: In my evaluation of this patient's dyspnea my DDx includes, but is not limited to, pneumonia, pulmonary embolism, pneumothorax, pulmonary edema, metabolic acidosis, asthma, COPD, cardiac cause, anemia, anxiety, etc.    Complete initial physical exam performed, notably the patient was in no acute distress.    Reviewed and confirmed nursing documentation for past medical history, family history, social history.  Vital signs reviewed.    Hypertensive  emergency  Interstitial edema Acute CHF> - He has orthopnea, exertional dyspnea, pedal edema. +rales, Blood pressure acutely elevated, has not been taking his blood pressure medication. - Concern for hypertensive emergency, possibly SCAPE - Borderline hypoxia, increased work of breathing - Start diuresis, give antihypertensive, nitro gtt, 2>3LNC - Patient reports significant improvement in his symptoms, chest pain has resolved, dyspnea greatly improved - BNP is over 6000, troponin 197> cardiology consult ?new chf although likely 2/2 htn emergency. Given asa. Trop leak favored to be demand ischemia; delta trop is pending, CP resolved - AKI cr 3.61, prior around 2 - spoke w/ cardiology fellow Dr Orlando, will see in consult - spoke Dr Layman, accepts pt for admit  Clinical Course as of 10/31/24 0011  Sat Oct 30, 2024  2202 CXR w/ interstitial edema [SG]  2313 Creatinine(!): 3.61 Worsened from prior  [SG]    Clinical Course User Index [SG] Elnor Jayson LABOR, DO                    Additional history obtained: -Additional history obtained from family -External records from outside source obtained and reviewed including: Chart review including previous notes, labs, imaging, consultation notes including  Home medications Allergies Prior labs   Lab Tests: -I ordered, reviewed, and interpreted labs.   The pertinent results include:   Labs Reviewed  PRO BRAIN NATRIURETIC PEPTIDE - Abnormal; Notable for the following components:      Result Value   Pro Brain Natriuretic Peptide 6,550.0 (*)    All other components within normal limits  COMPREHENSIVE METABOLIC PANEL WITH GFR - Abnormal; Notable for the following components:   Glucose, Bld 116 (*)    BUN 24 (*)    Creatinine, Ser 3.61 (*)    Calcium  8.7 (*)    Total Protein 6.3 (*)    Albumin 3.1 (*)    GFR, Estimated 19 (*)    All other components within normal limits  BLOOD GAS, VENOUS - Abnormal; Notable for the  following components:   pCO2, Ven 40 (*)    All other components within normal limits  TROPONIN T, HIGH SENSITIVITY - Abnormal; Notable for the following components:   Troponin T High Sensitivity 197 (*)    All other components within normal limits  RESP PANEL BY RT-PCR (RSV, FLU A&B, COVID)  RVPGX2  CBC WITH DIFFERENTIAL/PLATELET  TROPONIN T, HIGH SENSITIVITY    Notable for as above  EKG   EKG Interpretation Date/Time:  Saturday October 30 2024 21:35:43 EST Ventricular Rate:  103 PR Interval:  150 QRS Duration:  115 QT Interval:  379 QTC Calculation: 497 R Axis:   9  Text Interpretation: Sinus tachycardia Ventricular premature complex Biatrial enlargement LVH with IVCD and secondary repol abnrm Interpretation limited secondary to artifact Left ventricular hypertrophy rate increased but otherwise similar to prior Confirmed by Elnor Jayson ([id number]) on 10/30/2024 9:39:30 PM         Imaging Studies ordered: I ordered imaging studies including cxr I independently visualized the following imaging with scope of interpretation limited to determining acute life threatening conditions related to emergency care; findings noted above I agree with the radiologist interpretation If any imaging was obtained with contrast I closely monitored patient for any possible adverse reaction a/w contrast administration in the emergency department   Medicines ordered and prescription drug management: Meds ordered this encounter  Medications   hydrALAZINE  (APRESOLINE ) injection 10 mg   furosemide  (LASIX ) injection 80 mg   nitroGLYCERIN  50 mg in  dextrose  5 % 250 mL (0.2 mg/mL) infusion   lidocaine  (LIDODERM ) 5 % 1 patch   HYDROcodone -acetaminophen  (NORCO/VICODIN) 5-325 MG per tablet 1 tablet    Refill:  0   aspirin  chewable tablet 324 mg    -I have reviewed the patients home medicines and have made adjustments as needed   Consultations Obtained: I requested consultation with the dr orlando, dr  layman,  and discussed lab and imaging findings as well as pertinent plan - they recommend: will admit [facility]   Cardiac Monitoring: The patient was maintained on a cardiac monitor.  I personally viewed and interpreted the cardiac monitored which showed an underlying rhythm of: sinus tachy Continuous pulse oximetry interpreted by myself, 92% on RA >> 97% on 3LNC.    Social Determinants of Health:  Diagnosis or treatment significantly limited by social determinants of health: current smoker   Reevaluation: After the interventions noted above, I reevaluated the patient and found that they have improved  Co morbidities that complicate the patient evaluation  Past Medical History:  Diagnosis Date   Acute alcoholic pancreatitis    Arthritis    Chronic pain    Depression    Diabetes mellitus without complication (HCC)    Essential hypertension, benign    GERD (gastroesophageal reflux disease)    Headache    HTN (hypertension) 11/27/2015   Hyperlipidemia    Peripheral vascular disease    Sleep apnea    could not tolerate CPAP   Stroke (HCC)    2016      Dispostion: Disposition decision including need for hospitalization was considered, and patient admitted to the hospital.    Final Clinical Impression(s) / ED Diagnoses Final diagnoses:  Hypertensive emergency  Acute congestive heart failure, unspecified heart failure type (HCC)  AKI (acute kidney injury)        Elnor Jayson LABOR, DO 10/31/24 0012    Elnor Jayson LABOR, DO 10/31/24 0014

## 2024-10-30 NOTE — ED Triage Notes (Addendum)
 Pt daughter reports pt feet are more swollen then she has ever seen them and tonight when he tried to lay down he felt short of breath.  Pt has been out of his BP meds and is supposed to have an appointment with a new primary next week.

## 2024-10-31 ENCOUNTER — Inpatient Hospital Stay (HOSPITAL_COMMUNITY)

## 2024-10-31 DIAGNOSIS — F1010 Alcohol abuse, uncomplicated: Secondary | ICD-10-CM | POA: Diagnosis present

## 2024-10-31 DIAGNOSIS — I509 Heart failure, unspecified: Secondary | ICD-10-CM | POA: Diagnosis present

## 2024-10-31 DIAGNOSIS — I160 Hypertensive urgency: Secondary | ICD-10-CM

## 2024-10-31 DIAGNOSIS — I161 Hypertensive emergency: Secondary | ICD-10-CM | POA: Diagnosis present

## 2024-10-31 DIAGNOSIS — N183 Chronic kidney disease, stage 3 unspecified: Secondary | ICD-10-CM

## 2024-10-31 DIAGNOSIS — Z743 Need for continuous supervision: Secondary | ICD-10-CM | POA: Diagnosis not present

## 2024-10-31 DIAGNOSIS — I639 Cerebral infarction, unspecified: Secondary | ICD-10-CM

## 2024-10-31 DIAGNOSIS — F339 Major depressive disorder, recurrent, unspecified: Secondary | ICD-10-CM

## 2024-10-31 DIAGNOSIS — R7989 Other specified abnormal findings of blood chemistry: Secondary | ICD-10-CM | POA: Diagnosis not present

## 2024-10-31 DIAGNOSIS — E1122 Type 2 diabetes mellitus with diabetic chronic kidney disease: Secondary | ICD-10-CM

## 2024-10-31 DIAGNOSIS — N179 Acute kidney failure, unspecified: Secondary | ICD-10-CM | POA: Diagnosis present

## 2024-10-31 DIAGNOSIS — R6889 Other general symptoms and signs: Secondary | ICD-10-CM | POA: Diagnosis not present

## 2024-10-31 DIAGNOSIS — E785 Hyperlipidemia, unspecified: Secondary | ICD-10-CM

## 2024-10-31 DIAGNOSIS — I340 Nonrheumatic mitral (valve) insufficiency: Secondary | ICD-10-CM | POA: Diagnosis not present

## 2024-10-31 DIAGNOSIS — R0600 Dyspnea, unspecified: Secondary | ICD-10-CM

## 2024-10-31 DIAGNOSIS — I499 Cardiac arrhythmia, unspecified: Secondary | ICD-10-CM | POA: Diagnosis not present

## 2024-10-31 DIAGNOSIS — Z7401 Bed confinement status: Secondary | ICD-10-CM | POA: Diagnosis not present

## 2024-10-31 LAB — RAPID URINE DRUG SCREEN, HOSP PERFORMED
Amphetamines: NOT DETECTED
Barbiturates: NOT DETECTED
Benzodiazepines: NOT DETECTED
Cocaine: NOT DETECTED
Opiates: POSITIVE — AB
Tetrahydrocannabinol: NOT DETECTED

## 2024-10-31 LAB — PHOSPHORUS: Phosphorus: 3.9 mg/dL (ref 2.5–4.6)

## 2024-10-31 LAB — ECHOCARDIOGRAM COMPLETE
Calc EF: 29.1 %
Est EF: 30
S' Lateral: 4.5 cm
Single Plane A2C EF: 33.6 %
Single Plane A4C EF: 23 %
Weight: 3520.31 [oz_av]

## 2024-10-31 LAB — HEMOGLOBIN A1C
Hgb A1c MFr Bld: 5.8 % — ABNORMAL HIGH (ref 4.8–5.6)
Mean Plasma Glucose: 119.76 mg/dL

## 2024-10-31 LAB — CBC
HCT: 38 % — ABNORMAL LOW (ref 39.0–52.0)
Hemoglobin: 12.6 g/dL — ABNORMAL LOW (ref 13.0–17.0)
MCH: 32.1 pg (ref 26.0–34.0)
MCHC: 33.2 g/dL (ref 30.0–36.0)
MCV: 96.9 fL (ref 80.0–100.0)
Platelets: 232 K/uL (ref 150–400)
RBC: 3.92 MIL/uL — ABNORMAL LOW (ref 4.22–5.81)
RDW: 14.5 % (ref 11.5–15.5)
WBC: 10.3 K/uL (ref 4.0–10.5)
nRBC: 0 % (ref 0.0–0.2)

## 2024-10-31 LAB — BASIC METABOLIC PANEL WITH GFR
Anion gap: 10 (ref 5–15)
BUN: 24 mg/dL — ABNORMAL HIGH (ref 6–20)
CO2: 24 mmol/L (ref 22–32)
Calcium: 8.2 mg/dL — ABNORMAL LOW (ref 8.9–10.3)
Chloride: 109 mmol/L (ref 98–111)
Creatinine, Ser: 3.86 mg/dL — ABNORMAL HIGH (ref 0.61–1.24)
GFR, Estimated: 17 mL/min — ABNORMAL LOW (ref >60)
Glucose, Bld: 120 mg/dL — ABNORMAL HIGH (ref 70–99)
Potassium: 3.8 mmol/L (ref 3.5–5.1)
Sodium: 143 mmol/L (ref 135–145)

## 2024-10-31 LAB — MAGNESIUM: Magnesium: 1.7 mg/dL (ref 1.7–2.4)

## 2024-10-31 LAB — GLUCOSE, CAPILLARY
Glucose-Capillary: 134 mg/dL — ABNORMAL HIGH (ref 70–99)
Glucose-Capillary: 119 mg/dL — ABNORMAL HIGH (ref 70–99)
Glucose-Capillary: 115 mg/dL — ABNORMAL HIGH (ref 70–99)
Glucose-Capillary: 139 mg/dL — ABNORMAL HIGH (ref 70–99)
Glucose-Capillary: 110 mg/dL — ABNORMAL HIGH (ref 70–99)

## 2024-10-31 LAB — RESP PANEL BY RT-PCR (RSV, FLU A&B, COVID)  RVPGX2
Influenza A by PCR: NEGATIVE
Influenza B by PCR: NEGATIVE
Resp Syncytial Virus by PCR: NEGATIVE
SARS Coronavirus 2 by RT PCR: NEGATIVE

## 2024-10-31 LAB — HIV ANTIBODY (ROUTINE TESTING W REFLEX): HIV Screen 4th Generation wRfx: NONREACTIVE

## 2024-10-31 LAB — LIPASE, BLOOD: Lipase: 23 U/L (ref 11–51)

## 2024-10-31 LAB — MRSA NEXT GEN BY PCR, NASAL: MRSA by PCR Next Gen: NOT DETECTED

## 2024-10-31 LAB — CREATININE, SERUM
Creatinine, Ser: 3.85 mg/dL — ABNORMAL HIGH (ref 0.61–1.24)
GFR, Estimated: 18 mL/min — ABNORMAL LOW (ref >60)

## 2024-10-31 LAB — ETHANOL: Alcohol, Ethyl (B): <15 mg/dL (ref <15)

## 2024-10-31 LAB — TROPONIN T, HIGH SENSITIVITY: Troponin T High Sensitivity: 181 ng/L (ref 0–19)

## 2024-10-31 MED ORDER — ATORVASTATIN CALCIUM 80 MG PO TABS
80.0000 mg | ORAL_TABLET | Freq: Every day | ORAL | Status: DC
  Administered 2024-10-31 – 2024-11-05 (×6): 80 mg via ORAL
  Filled 2024-10-31: qty 1
  Filled 2024-10-31: qty 2
  Filled 2024-10-31 (×5): qty 1

## 2024-10-31 MED ORDER — AMLODIPINE BESYLATE 10 MG PO TABS
10.0000 mg | ORAL_TABLET | Freq: Every day | ORAL | Status: DC
  Administered 2024-10-31: 10 mg via ORAL
  Filled 2024-10-31: qty 1

## 2024-10-31 MED ORDER — GABAPENTIN 300 MG PO CAPS
300.0000 mg | ORAL_CAPSULE | Freq: Every day | ORAL | Status: DC
  Administered 2024-10-31 – 2024-11-05 (×6): 300 mg via ORAL
  Filled 2024-10-31 (×6): qty 1

## 2024-10-31 MED ORDER — CHLORHEXIDINE GLUCONATE CLOTH 2 % EX PADS
6.0000 | MEDICATED_PAD | Freq: Every day | CUTANEOUS | Status: DC
  Administered 2024-10-31 – 2024-11-05 (×5): 6 via TOPICAL

## 2024-10-31 MED ORDER — PANTOPRAZOLE SODIUM 40 MG PO TBEC
40.0000 mg | DELAYED_RELEASE_TABLET | Freq: Every day | ORAL | Status: DC
  Administered 2024-10-31 – 2024-11-05 (×6): 40 mg via ORAL
  Filled 2024-10-31 (×7): qty 1

## 2024-10-31 MED ORDER — SPIRONOLACTONE 25 MG PO TABS
50.0000 mg | ORAL_TABLET | Freq: Every day | ORAL | Status: DC
  Filled 2024-10-31: qty 2

## 2024-10-31 MED ORDER — HYDRALAZINE HCL 20 MG/ML IJ SOLN: 10.0000 mg | INTRAMUSCULAR | Status: DC | PRN

## 2024-10-31 MED ORDER — ASPIRIN 81 MG PO CHEW
81.0000 mg | CHEWABLE_TABLET | Freq: Every day | ORAL | Status: DC
  Administered 2024-10-31 – 2024-11-05 (×6): 81 mg via ORAL
  Filled 2024-10-31 (×7): qty 1

## 2024-10-31 MED ORDER — CLOPIDOGREL BISULFATE 75 MG PO TABS
75.0000 mg | ORAL_TABLET | Freq: Every day | ORAL | Status: DC
  Administered 2024-10-31 – 2024-11-05 (×6): 75 mg via ORAL
  Filled 2024-10-31 (×7): qty 1

## 2024-10-31 MED ORDER — DOCUSATE SODIUM 100 MG PO CAPS: 100.0000 mg | ORAL_CAPSULE | Freq: Two times a day (BID) | ORAL | Status: DC | PRN

## 2024-10-31 MED ORDER — HYDRALAZINE HCL 25 MG PO TABS: 25.0000 mg | ORAL_TABLET | Freq: Two times a day (BID) | ORAL | Status: DC

## 2024-10-31 MED ORDER — INSULIN ASPART 100 UNIT/ML IJ SOLN: 0.0000 [IU] | Freq: Every day | INTRAMUSCULAR | Status: DC

## 2024-10-31 MED ORDER — AMLODIPINE BESYLATE 10 MG PO TABS: 10.0000 mg | ORAL_TABLET | Freq: Every day | ORAL | Status: DC

## 2024-10-31 MED ORDER — ESCITALOPRAM OXALATE 10 MG PO TABS
10.0000 mg | ORAL_TABLET | Freq: Every day | ORAL | Status: DC
  Administered 2024-10-31 – 2024-11-05 (×6): 10 mg via ORAL
  Filled 2024-10-31 (×7): qty 1

## 2024-10-31 MED ORDER — LABETALOL HCL 200 MG PO TABS
300.0000 mg | ORAL_TABLET | Freq: Two times a day (BID) | ORAL | Status: DC
  Administered 2024-10-31 – 2024-11-01 (×3): 300 mg via ORAL
  Filled 2024-10-31: qty 2
  Filled 2024-10-31: qty 1
  Filled 2024-10-31: qty 2
  Filled 2024-10-31: qty 1

## 2024-10-31 MED ORDER — ONDANSETRON HCL 4 MG/2ML IJ SOLN
4.0000 mg | Freq: Four times a day (QID) | INTRAMUSCULAR | Status: DC | PRN
  Administered 2024-10-31 (×2): 4 mg via INTRAVENOUS
  Filled 2024-10-31 (×2): qty 2

## 2024-10-31 MED ORDER — SACUBITRIL-VALSARTAN 97-103 MG PO TABS
1.0000 | ORAL_TABLET | Freq: Two times a day (BID) | ORAL | Status: DC
  Administered 2024-11-01: 1 via ORAL
  Filled 2024-10-31 (×2): qty 1

## 2024-10-31 MED ORDER — INSULIN ASPART 100 UNIT/ML IJ SOLN
0.0000 [IU] | Freq: Three times a day (TID) | INTRAMUSCULAR | Status: DC
  Administered 2024-10-31: 2 [IU] via SUBCUTANEOUS
  Administered 2024-11-01 – 2024-11-02 (×3): 3 [IU] via SUBCUTANEOUS
  Administered 2024-11-02 – 2024-11-04 (×3): 2 [IU] via SUBCUTANEOUS
  Filled 2024-10-31: qty 2
  Filled 2024-10-31: qty 3
  Filled 2024-10-31 (×3): qty 2
  Filled 2024-10-31 (×2): qty 3
  Filled 2024-10-31: qty 2

## 2024-10-31 MED ORDER — POLYETHYLENE GLYCOL 3350 17 G PO PACK: 17.0000 g | PACK | Freq: Every day | ORAL | Status: DC | PRN

## 2024-10-31 MED ORDER — HYDRALAZINE HCL 25 MG PO TABS
25.0000 mg | ORAL_TABLET | Freq: Two times a day (BID) | ORAL | Status: DC
  Administered 2024-10-31 – 2024-11-02 (×5): 25 mg via ORAL
  Filled 2024-10-31 (×5): qty 1

## 2024-10-31 MED ORDER — OXYCODONE HCL 5 MG PO TABS
5.0000 mg | ORAL_TABLET | Freq: Once | ORAL | Status: AC
  Administered 2024-10-31: 5 mg via ORAL
  Filled 2024-10-31: qty 1

## 2024-10-31 MED ORDER — ADULT MULTIVITAMIN W/MINERALS CH
1.0000 | ORAL_TABLET | Freq: Every day | ORAL | Status: DC
  Administered 2024-10-31 – 2024-11-05 (×6): 1 via ORAL
  Filled 2024-10-31 (×7): qty 1

## 2024-10-31 MED ORDER — ORAL CARE MOUTH RINSE: 15.0000 mL | OROMUCOSAL | Status: DC | PRN

## 2024-10-31 MED ORDER — MAGNESIUM SULFATE 2 GM/50ML IV SOLN
2.0000 g | Freq: Once | INTRAVENOUS | Status: AC
  Administered 2024-10-31: 2 g via INTRAVENOUS
  Filled 2024-10-31: qty 50

## 2024-10-31 MED ORDER — HEPARIN SODIUM (PORCINE) 5000 UNIT/ML IJ SOLN
5000.0000 [IU] | Freq: Three times a day (TID) | INTRAMUSCULAR | Status: DC
  Administered 2024-10-31 – 2024-11-06 (×19): 5000 [IU] via SUBCUTANEOUS
  Filled 2024-10-31 (×19): qty 1

## 2024-10-31 NOTE — Progress Notes (Signed)
  Echocardiogram 2D Echocardiogram has been performed.  Damien FALCON Brace Welte RDCS 10/31/2024, 8:30 AM  Cards notified of stat

## 2024-10-31 NOTE — Plan of Care (Signed)
°  Problem: Education: °Goal: Knowledge of General Education information will improve °Description: Including pain rating scale, medication(s)/side effects and non-pharmacologic comfort measures °Outcome: Progressing °  °Problem: Health Behavior/Discharge Planning: °Goal: Ability to manage health-related needs will improve °Outcome: Progressing °  °Problem: Clinical Measurements: °Goal: Will remain free from infection °Outcome: Progressing °Goal: Respiratory complications will improve °Outcome: Progressing °Goal: Cardiovascular complication will be avoided °Outcome: Progressing °  °Problem: Activity: °Goal: Risk for activity intolerance will decrease °Outcome: Progressing °  °

## 2024-10-31 NOTE — Progress Notes (Signed)
 Day RN attempted to call report to Taylor Obrien.  Nurse unable to take report at this time.  Will attempt to call report momentarily.
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
## 2024-10-31 NOTE — Progress Notes (Signed)
 eLink Physician-Brief Progress Note Patient Name: Taylor Obrien DOB: 1966/02/12 MRN: [id number]   Date of Service  10/31/2024  HPI/Events of Note  requesting something for pain in the back and bottom 7/10 from laying in bed/stretcher  eICU Interventions  Oxycodone  5mg  x1     Intervention Category Intermediate Interventions: Pain - evaluation and management  Thena Devora 10/31/2024, 10:57 PM

## 2024-10-31 NOTE — H&P (Signed)
 NAME:  Taylor Obrien, MRN:  [id number], DOB:  02/12/1966, LOS: 0 ADMISSION DATE:  10/30/2024, CONSULTATION DATE:  10/31/24  REFERRING MD: Jayson Pereyra DO CHIEF COMPLAINT:  Leg Swelling   History of Present Illness:  Patient is a 58 year old male with significant past medical history of hypertension, PVD, CVA, CKD stage III, hyperlipidemia, DM2, EtOH abuse, history of alcoholic pancreatitis, major depressive disorder, Suicidal Ideation (Admission for SI 5/14-5/19/25), ED visit for left foot pain (06/27/24- CTA showing stenosis/plaque in femoral arteries, decreased flow-started on plavix  and ASA, ABIs ordered , saw MD Lanis with vascular diagnosed with PAD, not vascular candidate 07/01/24) and GERD who presented to [facility] emergency department with shortness of breath, leg swelling, and chest tightness.  Upon arrival to ED, report indicated that patient has been out of hypertensive medications for at least 3 weeks and experiencing orthopnea and more shortness of breath upon exertion.  Per ED workup, patient denied any nausea, vomiting, diaphoresis, and no syncope/near syncope.  Systolic blood pressure greater than 200s, and diastolic blood pressure greater than 140s.  Patient given hydralazine  10 mg, Lasix  80 mg, and started on nitroglycerin  infusion for hypertensive emergency and possible acute CHF exacerbation.  PCCM consulted for admission due to hypertensive emergency requiring nitroglycerin  infusion, and cardiology also consulted to assist in management.  Initial lab work NA 143, K4.4, chloride 108, CO2 25, glucose 116, UN 24, creatinine 3.61, calcium  8.7, anion gap 10, GFR 19  ALP 70, albumin 3.1, AST 35, ALT 16  proBNP-6550  Troponin 197>>181  WBC 7.2, hemoglobin 14.1, platelets 249  Chest x-ray-pulmonary vascular congestion/interstitial edema No evidence of pneumonia/pleural effusion EKG-sinus tachycardia, LVH  Flu, COVID, RSV PCR pending  Echo 06/19/23-EF 50 to 55%, LV low normal  function, LV demonstrates regional wall abnormalities, grade 1 diastolic dysfunction, RV normal, RV systolic function normal, LA mild to moderate dilation, trivial mitral valve regurg  Upon arrival to [facility] ICU, patient's SBP >190s/DBP >140s on 90 mcg of Nitroglycerin . Patient endorses that he feels better than he did earlier while at [facility]. Patient also endorses that he stopped drinking approximately 2 yrs ago but still smokes half a pack of cigarettes a day. Patient states that he lives at home with his 3 children but does not really get any help from them. Patient reports that he utilize a wheelchair to get around since his stroke in 2016, where he mentions that he has no movement in his left leg but no other deficits. Per patient he has been out of medications since this summer and has no PCP.   Pertinent  Medical History   Past Medical History:  Diagnosis Date   Acute alcoholic pancreatitis    Arthritis    Chronic pain    Depression    Diabetes mellitus without complication (HCC)    Essential hypertension, benign    GERD (gastroesophageal reflux disease)    Headache    HTN (hypertension) 11/27/2015   Hyperlipidemia    Peripheral vascular disease    Sleep apnea    could not tolerate CPAP   Stroke (HCC)    2016     Significant Hospital Events: Including procedures, antibiotic start and stop dates in addition to other pertinent events   11/23 PCCM admit for hypertensive emergency/CHF exacerbation   Interim History / Subjective:  SBP >190s   On nitroglycerin  gtt,   Alert and oriented x 4  Patient having nausea   Approximately 800 cc of urine since lasix  per nursing staff  Objective    Blood pressure (!) 203/143, pulse 87, temperature 99.3 F (37.4 C), resp. rate 14, weight 99.8 kg, SpO2 98%.        Intake/Output Summary (Last 24 hours) at 10/31/2024 0021 Last data filed at 10/30/2024 2258 Gross per 24 hour  Intake --  Output 350 ml  Net -350 ml   Filed Weights    10/30/24 2114  Weight: 99.8 kg    Examination: General: acute on chronic adult male, sitting up in ICU bed in NAD HEENT: Normocephalic, PERRLA intact, ETT, OG  Pink MM, missing teeth  CV: s1,s2, RRR, no MRG, No JVD  pulm: clear, diminished, no distress on 2LNC  Abs: bs active, soft  Extremities: 1+ B/L lower extremity edema, not overly fluid overloaded, moves all extremities except left lower leg- (known CVA deficit), non palpable pulses in lower extremities Skin: no rash  Neuro: Rass 0, alert oriented x 4 GU: deferred   Resolved problem list   Assessment and Plan  Hypertensive emergency Acute on chronic CHF exacerbation Elevated proBNP Elevated troponin- trending down  Dyspnea Echo 06/19/23-EF 50 to 55%, LV low normal function, LV demonstrates regional wall abnormalities, grade 1 diastolic dysfunction, RV normal, RV systolic function normal, LA mild to moderate dilation, trivial mitral valve regurg Per med list-patient was on amlodipine  10 mg daily, Catapres  0.3 mg per 24 hours Jardiance  25 mg daily, hydralazine  25 mg  twice daily, labetalol  300 mg twice daily P: Received 80 mg of IV Lasix -monitor output, evaluate need for additional diuresis later today on 11/23 Trend troponins-suspect elevation is in response to demand ischemia but continue to trend STAT echo Continue nitroglycerin  infusion-SBP goal-decrease the SBP no more than 25% within the first hour, BP goal 160/100 within 2-6hrs, if nitroglycerin  infusion continues to increase, may need to transition to another agent  Will need assistance obtain home medications at time of discharge-TOC consult Obtain urine tox Obtain EtOH level Cardiology consulted, appreciate assistance, GDMT per cards recs Currently on 2 to 3 L nasal cannula, O2 sat goal greater than 92%, wean O2 Start amlodipine  10 mg daily, hydralazine  25 mg twice daily daily at first  Keep NPO for now, sips with meds  HTN HLD CVA PAD- (saw Vascular  outpatient 07/01/24, not a vascular candidate due to chronic condition, no longer ambulating)- non palpable pulses noted at this appointment  Supposed to be on atorvastatin  80 mg daily P: Restart statin Restart ASA, Plavix  SCDs  Doppler pulses lower extremities- able to get PTs, but not PDs B/L  TOC consult as above  AKI on CKD stage III Appears that baseline creatinine is between 1.9-2 over the last year AKI in setting of HTN emergency  P: Continue to trend renal function daily  Continue to monitor and optimize electrolytes daily Continue to monitor urine output Continue strict I/Os Continue Adequate renal perfusion  Avoid nephrotoxic agents   DM 2 P: Place on SSI, CBGs every 4 hours Obtain hemoglobin A1c  EtOH abuse Tobacco use  History of alcoholic pancreatitis LFTs normal, needs EtOH level P: Obtain EtOH level CIWA assessments Substance abuse counseling/education in regards to cessation Obtain tox urine Trend LFTs Smoking cessation/education   MDD Suicidal Attempt 04/21/24 On Lexapro  P Restart Lexapro , supportive care No suicidal ideation upon ED workup, continue to assess   GERD Protonix  P: Restart PPI   Labs   CBC: Recent Labs  Lab 10/30/24 2210  WBC 7.2  NEUTROABS 5.3  HGB 14.1  HCT 43.3  MCV 97.7  PLT 249    Basic Metabolic Panel: Recent Labs  Lab 10/30/24 2210  NA 143  K 4.4  CL 108  CO2 25  GLUCOSE 116*  BUN 24*  CREATININE 3.61*  CALCIUM  8.7*   GFR: CrCl cannot be calculated (Unknown ideal weight.). Recent Labs  Lab 10/30/24 2210  WBC 7.2    Liver Function Tests: Recent Labs  Lab 10/30/24 2210  AST 35  ALT 16  ALKPHOS 70  BILITOT 0.2  PROT 6.3*  ALBUMIN 3.1*   No results for input(s): LIPASE, AMYLASE in the last 168 hours. No results for input(s): AMMONIA in the last 168 hours.  ABG    Component Value Date/Time   HCO3 24.2 10/30/2024 2210   TCO2 26 11/18/2015 1111   ACIDBASEDEF 0.5 10/30/2024 2210    O2SAT 74.7 10/30/2024 2210     Coagulation Profile: No results for input(s): INR, PROTIME in the last 168 hours.  Cardiac Enzymes: No results for input(s): CKTOTAL, CKMB, CKMBINDEX, TROPONINI in the last 168 hours.  HbA1C: Hgb A1c MFr Bld  Date/Time Value Ref Range Status  01/30/2024 07:21 AM 6.7 (H) 4.8 - 5.6 % Final    Comment:    (NOTE) Pre diabetes:          5.7%-6.4%  Diabetes:              >6.4%  Glycemic control for   <7.0% adults with diabetes   06/18/2023 12:52 PM 5.9 (H) 4.8 - 5.6 % Final    Comment:    (NOTE)         Prediabetes: 5.7 - 6.4         Diabetes: >6.4         Glycemic control for adults with diabetes: <7.0     CBG: No results for input(s): GLUCAP in the last 168 hours.  Review of Systems:   Review of Systems  Constitutional: Negative.   HENT: Negative.    Eyes: Negative.   Respiratory: Negative.    Cardiovascular: Negative.   Gastrointestinal:  Positive for nausea.  Genitourinary: Negative.   Musculoskeletal: Negative.   Skin: Negative.   Neurological: Negative.   Endo/Heme/Allergies: Negative.   Psychiatric/Behavioral:  Positive for depression.      Past Medical History:  He,  has a past medical history of Acute alcoholic pancreatitis, Arthritis, Chronic pain, Depression, Diabetes mellitus without complication (HCC), Essential hypertension, benign, GERD (gastroesophageal reflux disease), Headache, HTN (hypertension) (11/27/2015), Hyperlipidemia, Peripheral vascular disease, Sleep apnea, and Stroke (HCC).   Surgical History:   Past Surgical History:  Procedure Laterality Date   BIOPSY  08/19/2022   Procedure: BIOPSY;  Surgeon: Cindie Carlin POUR, DO;  Location: [facility] ENDO SUITE;  Service: Endoscopy;;   CLOSED REDUCTION MANDIBULAR FRACTURE W/ ARCH BARS     + multiple extractions   COLONOSCOPY WITH PROPOFOL  N/A 08/19/2022   Procedure: COLONOSCOPY WITH PROPOFOL ;  Surgeon: Cindie Carlin POUR, DO;  Location: [facility];   Service: Endoscopy;  Laterality: N/A;  9:15am   Condyloma resection     ESOPHAGOGASTRODUODENOSCOPY (EGD) WITH PROPOFOL  N/A 08/19/2022   Procedure: ESOPHAGOGASTRODUODENOSCOPY (EGD) WITH PROPOFOL ;  Surgeon: Cindie Carlin POUR, DO;  Location: [facility];  Service: Endoscopy;  Laterality: N/A;   MULTIPLE EXTRACTIONS WITH ALVEOLOPLASTY Bilateral 01/23/2018   Procedure: DENTAL EXTRACTION OF TEETH NUMBER ONE, TWO, THREE, FOUR, FIVE, SIX, SEVEN, EIGHT, NINE, TEN, ELEVEN, TWELVE, THIRTEEN, FOURTEEN, FIFTEEN, SIXTEEN, SEVENTEEN, TWENTY, TWENTY-ONE, TWENTY-TWO, TWENTY-THREE, TWENTY-FOUR, TWENTY-FIVE, TWENTY-SIX, TWENTY-SEVEN, TWENTY-EIGHT, TWENTY-NINE, THIRTY-TWO WITH ALVEOLOPLASTY;  Surgeon:  Sheryle Hamilton, DDS;  Location: [facility] OR;  Service: Oral Surgery;  Lat   POLYPECTOMY  08/19/2022   Procedure: POLYPECTOMY;  Surgeon: Cindie Carlin POUR, DO;  Location: [facility];  Service: Endoscopy;;   RADIOLOGY WITH ANESTHESIA N/A 11/18/2015   Procedure: RADIOLOGY WITH ANESTHESIA;  Surgeon: Thyra Nash, MD;  Location: [facility] OR;  Service: Radiology;  Laterality: N/A;   Removal foreign body right shoulder     Right rotator cuff repair     TEE WITHOUT CARDIOVERSION N/A 11/21/2015   Procedure: TRANSESOPHAGEAL ECHOCARDIOGRAM (TEE);  Surgeon: Ezra GORMAN Shuck, MD;  Location: [facility] ENDOSCOPY;  Service: Cardiovascular;  Laterality: N/A;   TOOTH EXTRACTION N/A 01/24/2018   Procedure: SUTURE ORAL WOUND;  Surgeon: Sheryle Hamilton, DDS;  Location: [facility] OR;  Service: Oral Surgery;  Laterality: N/A;     Social History:   reports that he has been smoking cigarettes. He has a 15 pack-year smoking history. He has never used smokeless tobacco. He reports that he does not currently use alcohol. He reports current drug use. Drug: Marijuana.   Family History:  His family history includes Diabetes in his brother, father, and mother; Drug abuse in his mother; Hyperlipidemia in his father and mother; Hypertension in his brother, father, and  mother.   Allergies Allergies  Allergen Reactions   Ibuprofen  Other (See Comments)    Avoid per PCP   Oxcarbazepine Other (See Comments)    Patient goes out of right state of mind.      Home Medications  Prior to Admission medications   Medication Sig Start Date End Date Taking? Authorizing Provider  amLODipine  (NORVASC ) 10 MG tablet Take 1 tablet (10 mg total) by mouth daily. 01/19/16   Kirsteins, Prentice BRAVO, MD  aspirin  81 MG chewable tablet Chew 1 tablet (81 mg total) by mouth daily. 06/27/24   Daralene Lonni BIRCH, PA-C  atorvastatin  (LIPITOR ) 80 MG tablet Take 1 tablet (80 mg total) by mouth daily. 04/26/24   Jadapalle, Sree, MD  cloNIDine  (CATAPRES  - DOSED IN MG/24 HR) 0.3 mg/24hr patch Place 1 patch (0.3 mg total) onto the skin once a week. 04/29/24   Donnelly Mellow, MD  clopidogrel  (PLAVIX ) 75 MG tablet Take 1 tablet (75 mg total) by mouth daily. 06/27/24   Daralene Lonni BIRCH, PA-C  empagliflozin  (JARDIANCE ) 25 MG TABS tablet Take 25 mg by mouth daily.     [provider]  escitalopram  (LEXAPRO ) 10 MG tablet Take 1 tablet (10 mg total) by mouth daily. 04/26/24   Jadapalle, Sree, MD  fenofibrate  (TRICOR ) 145 MG tablet Take 145 mg by mouth daily. 04/10/22   [provider]  gabapentin  (NEURONTIN ) 300 MG capsule Take 1 capsule (300 mg total) by mouth at bedtime. 04/26/24   Jadapalle, Sree, MD  hydrALAZINE  (APRESOLINE ) 25 MG tablet Take 1 tablet (25 mg total) by mouth 2 (two) times daily. 04/21/24   Evonnie Lenis, MD  insulin  aspart (NOVOLOG ) 100 UNIT/ML injection Inject 0-5 Units into the skin at bedtime. 04/26/24   Jadapalle, Sree, MD  insulin  aspart (NOVOLOG ) 100 UNIT/ML injection Inject 0-9 Units into the skin 3 (three) times daily with meals. 04/26/24   Jadapalle, Sree, MD  labetalol  (NORMODYNE ) 300 MG tablet Take 1 tablet (300 mg total) by mouth 2 (two) times daily. 04/26/24   Donnelly Mellow, MD  lidocaine  (LIDODERM ) 5 % Place 2 patches onto the skin daily. Remove & Discard  patch within 12 hours or as directed by MD 04/26/24   Jadapalle, Sree, MD  melatonin  5 MG TABS Take 0.5 tablets (2.5 mg total) by mouth at bedtime. 04/26/24   Jadapalle, Sree, MD  Multiple Vitamin (MULTIVITAMIN WITH MINERALS) TABS tablet Take 1 tablet by mouth daily. 04/26/24   Jadapalle, Sree, MD  nicotine  (NICODERM CQ  - DOSED IN MG/24 HOURS) 21 mg/24hr patch Place 1 patch (21 mg total) onto the skin daily at 6 (six) AM. 04/26/24   Donnelly Mellow, MD  oxyCODONE  (OXY IR/ROXICODONE ) 5 MG immediate release tablet Take 1 tablet (5 mg total) by mouth every 8 (eight) hours as needed for moderate pain (pain score 4-6). 04/26/24   Jadapalle, Sree, MD  pantoprazole  (PROTONIX ) 40 MG tablet Take 1 tablet (40 mg total) by mouth at bedtime. 01/19/16   Kirsteins, Prentice BRAVO, MD  VASCEPA  1 g capsule Take 2 g by mouth 2 (two) times daily. 03/12/22   [provider]     Critical care time: 60 mins     Christian Milani Lowenstein AGACNP-BC   [facility] 10/31/2024, 3:16 AM  Please see [email or web address] for pager details.  From 7A-7P if no response, please call [phone]. After hours, please call ELink [phone].

## 2024-10-31 NOTE — Plan of Care (Signed)
  Problem: Clinical Measurements: Goal: Will remain free from infection Outcome: Progressing Goal: Diagnostic test results will improve Outcome: Progressing Goal: Respiratory complications will improve Outcome: Progressing Goal: Cardiovascular complication will be avoided Outcome: Progressing   Problem: Activity: Goal: Risk for activity intolerance will decrease Outcome: Progressing   Problem: Coping: Goal: Level of anxiety will decrease Outcome: Progressing   

## 2024-10-31 NOTE — Progress Notes (Signed)
 eLink Physician-Brief Progress Note Patient Name: Taylor Obrien DOB: [birth year] MRN: [id number]   Date of Service  10/31/2024  HPI/Events of Note  58 year old with a history of essential hypertension, PVD/CVA/CKD and alcohol use and severe depression with previous SI who presented with hypertensive emergency complicated by acute on chronic heart failure exacerbation and acute kidney injury.  Patient is hypertensive, tachycardic on a nitroglycerin  drip.  Results consistent with acute kidney injury, elevated BNP, and anemia.  Radiograph consistent with pulmonary edema/fluid overload  eICU Interventions  Oral antihypertensives, nitro drip  Continue dual antiplatelet therapy, statin  Continue intermittent diuresis  DVT prophylaxis with heparin  GI prophylaxis not indicated     Intervention Category Evaluation Type: New Patient Evaluation  [name] 10/31/2024, 3:00 AM

## 2024-10-31 NOTE — ED Notes (Signed)
[facility] CALLED FOR TRANSPORT °

## 2024-10-31 NOTE — Consult Note (Signed)
 Cardiology Consultation:   Patient ID: Taylor Obrien MRN: [id number]; DOB: 07-28-1966  Admit date: 10/30/2024 Date of Consult: 10/31/2024  Primary Care Provider: Trudy Ave, NP [facility] Cardiologist: None  [facility] Electrophysiologist:  None   Patient Profile:   Taylor Obrien is a 58 y.o. male HTN, prior CVA in 2016, left-sided deficits, PAD, CKD, DM2, OSA who was evaluated for HTN urgency.  History of Present Illness:   Mr. Espin developed acute onset shortness of breath when he was try to go to sleep.  His baseline functional status is very limited secondary to his prior stroke and uses a wheelchair to get around.  He typically does not have any shortness of breath with rest or dyspnea on exertion.  He presented to [facility] ED where he was found to be hypertensive to the 200s and was started on nitroglycerin  and given Lasix  80 mg IV.  He was transferred to [facility].  His BNP was significantly elevated at 6500 and his HST was mildly elevated at 197 but relatively flat to 181.  He has CKD with possible AKI although recent baseline not known.  Following Lasix  and improvement in blood pressure his symptoms all resolved.  He unfortunately has been off all of his medications for several months as he said he could not get his medications secondary to insurance issues.  Past Medical History:  Diagnosis Date   Acute alcoholic pancreatitis    Arthritis    Chronic pain    Depression    Diabetes mellitus without complication (HCC)    Essential hypertension, benign    GERD (gastroesophageal reflux disease)    Headache    HTN (hypertension) 11/27/2015   Hyperlipidemia    Peripheral vascular disease    Sleep apnea    could not tolerate CPAP   Stroke (HCC)    2016   Past Surgical History:  Procedure Laterality Date   BIOPSY  08/19/2022   Procedure: BIOPSY;  Surgeon: Cindie Carlin POUR, DO;  Location: [facility];  Service: Endoscopy;;   CLOSED REDUCTION MANDIBULAR FRACTURE W/ ARCH  BARS     + multiple extractions   COLONOSCOPY WITH PROPOFOL  N/A 08/19/2022   Procedure: COLONOSCOPY WITH PROPOFOL ;  Surgeon: Cindie Carlin POUR, DO;  Location: [facility];  Service: Endoscopy;  Laterality: N/A;  9:15am   Condyloma resection     ESOPHAGOGASTRODUODENOSCOPY (EGD) WITH PROPOFOL  N/A 08/19/2022   Procedure: ESOPHAGOGASTRODUODENOSCOPY (EGD) WITH PROPOFOL ;  Surgeon: Cindie Carlin POUR, DO;  Location: [facility];  Service: Endoscopy;  Laterality: N/A;   MULTIPLE EXTRACTIONS WITH ALVEOLOPLASTY Bilateral 01/23/2018   Procedure: DENTAL EXTRACTION OF TEETH NUMBER ONE, TWO, THREE, FOUR, FIVE, SIX, SEVEN, EIGHT, NINE, TEN, ELEVEN, TWELVE, THIRTEEN, FOURTEEN, FIFTEEN, SIXTEEN, SEVENTEEN, TWENTY, TWENTY-ONE, TWENTY-TWO, TWENTY-THREE, TWENTY-FOUR, TWENTY-FIVE, TWENTY-SIX, TWENTY-SEVEN, TWENTY-EIGHT, TWENTY-NINE, THIRTY-TWO WITH ALVEOLOPLASTY;  Surgeon: Sheryle Hamilton, DDS;  Location: [facility] OR;  Service: Oral Surgery;  Lat   POLYPECTOMY  08/19/2022   Procedure: POLYPECTOMY;  Surgeon: Cindie Carlin POUR, DO;  Location: [facility];  Service: Endoscopy;;   RADIOLOGY WITH ANESTHESIA N/A 11/18/2015   Procedure: RADIOLOGY WITH ANESTHESIA;  Surgeon: Thyra Nash, MD;  Location: [facility] OR;  Service: Radiology;  Laterality: N/A;   Removal foreign body right shoulder     Right rotator cuff repair     TEE WITHOUT CARDIOVERSION N/A 11/21/2015   Procedure: TRANSESOPHAGEAL ECHOCARDIOGRAM (TEE);  Surgeon: Ezra GORMAN Shuck, MD;  Location: [facility] ENDOSCOPY;  Service: Cardiovascular;  Laterality: N/A;   TOOTH EXTRACTION N/A 01/24/2018  Procedure: SUTURE ORAL WOUND;  Surgeon: Sheryle Hamilton, DDS;  Location: [facility] OR;  Service: Oral Surgery;  Laterality: N/A;    Inpatient Medications: Scheduled Meds:  amLODipine   10 mg Oral Daily   aspirin   81 mg Oral Daily   atorvastatin   80 mg Oral Daily   Chlorhexidine  Gluconate Cloth  6 each Topical Daily   clopidogrel   75 mg Oral Daily   escitalopram   10 mg Oral Daily   gabapentin    300 mg Oral QHS   heparin   5,000 Units Subcutaneous Q8H   hydrALAZINE   25 mg Oral BID   insulin  aspart  0-15 Units Subcutaneous TID WC   insulin  aspart  0-5 Units Subcutaneous QHS   labetalol   300 mg Oral BID   multivitamin with minerals  1 tablet Oral Daily   pantoprazole   40 mg Oral Daily   Continuous Infusions:  nitroGLYCERIN  Stopped (10/31/24 1425)   PRN Meds: docusate sodium , hydrALAZINE , ondansetron  (ZOFRAN ) IV, mouth rinse, polyethylene glycol  Allergies:    Allergies  Allergen Reactions   Ibuprofen  Other (See Comments)    Avoid per PCP   Oxcarbazepine Other (See Comments)    Patient goes out of right state of mind.    Social History:   Social History   Socioeconomic History   Marital status: Married    Spouse name: Not on file   Number of children: Not on file   Years of education: Not on file   Highest education level: Not on file  Occupational History   Not on file  Tobacco Use   Smoking status: Every Day    Current packs/day: 0.50    Average packs/day: 0.5 packs/day for 30.0 years (15.0 ttl pk-yrs)    Types: Cigarettes   Smokeless tobacco: Never  Vaping Use   Vaping status: Never Used  Substance and Sexual Activity   Alcohol use: Not Currently    Comment: 2 40 oz daily. 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 Stopped liquor 5 years ago.states no etoh since 03/2022   Drug use: Yes    Types: Marijuana    Comment: occassionally   Sexual activity: Yes    Birth control/protection: None  Other Topics Concern   Not on file  Social History Narrative   Not on file   Social Drivers of Health   Financial Resource Strain: Not on file  Food Insecurity: Food Insecurity Present (10/31/2024)   Hunger Vital Sign    Worried About Running Out of Food in the Last Year: Often true    Ran Out of Food in the Last Year: Often true  Transportation Needs: No Transportation Needs (10/31/2024)   PRAPARE - Administrator, Civil Service (Medical): No    Lack of  Transportation (Non-Medical): No  Physical Activity: Not on file  Stress: Not on file  Social Connections: Not on file  Intimate Partner Violence: Not At Risk (10/31/2024)   Humiliation, Afraid, Rape, and Kick questionnaire    Fear of Current or Ex-Partner: No    Emotionally Abused: No    Physically Abused: No    Sexually Abused: No    Family History:    Family History  Problem Relation Age of Onset   Diabetes Mother    Hypertension Mother    Drug abuse Mother    Hyperlipidemia Mother    Diabetes Father    Hypertension Father    Hyperlipidemia Father    Diabetes Brother    Hypertension Brother     ROS:  Review of Systems: [y] = yes, [ ]  = no      General: Weight gain [ ] ; Weight loss [ ] ; Anorexia [ ] ; Fatigue [ ] ; Fever [ ] ; Chills [ ] ; Weakness [ ]    Cardiac: Chest pain/pressure [ ] ; Resting SOB [y]; Exertional SOB [y]; Orthopnea [ ] ; Pedal Edema [ ] ; Palpitations [ ] ; Syncope [ ] ; Presyncope [ ] ; Paroxysmal nocturnal dyspnea [ ]    Pulmonary: Cough [ ] ; Wheezing [ ] ; Hemoptysis [ ] ; Sputum [ ] ; Snoring [ ]    GI: Vomiting [ ] ; Dysphagia [ ] ; Melena [ ] ; Hematochezia [ ] ; Heartburn [ ] ; Abdominal pain [ ] ; Constipation [ ] ; Diarrhea [ ] ; BRBPR [ ]    GU: Hematuria [ ] ; Dysuria [ ] ; Nocturia [ ]  Vascular: Pain in legs with walking [ ] ; Pain in feet with lying flat [ ] ; Non-healing sores [ ] ; Stroke [ ] ; TIA [ ] ; Slurred speech [ ] ;   Neuro: Headaches [ ] ; Vertigo [ ] ; Seizures [ ] ; Paresthesias [ ] ;Blurred vision [ ] ; Diplopia [ ] ; Vision changes [ ]    Ortho/Skin: Arthritis [ ] ; Joint pain [ ] ; Muscle pain [ ] ; Joint swelling [ ] ; Back Pain [ ] ; Rash [ ]    Psych: Depression [ ] ; Anxiety [ ]    Heme: Bleeding problems [ ] ; Clotting disorders [ ] ; Anemia [ ]    Endocrine: Diabetes [ ] ; Thyroid dysfunction [ ]    Physical Exam/Data:   Vitals:   10/31/24 1815 10/31/24 1830 10/31/24 1845 10/31/24 1900  BP: (!) 137/90 138/87 (!) 146/100 (!) 145/93  Pulse: 93 87 88 93  Resp: 14 15  15 14   Temp:      TempSrc:      SpO2: 94% 92% 95% 95%  Weight:      Height:        Intake/Output Summary (Last 24 hours) at 10/31/2024 1922 Last data filed at 10/31/2024 1500 Gross per 24 hour  Intake 411.83 ml  Output 1200 ml  Net -788.17 ml      10/30/2024    9:14 PM 04/21/2024    7:00 PM 04/19/2024   11:23 PM  Last 3 Weights  Weight (lbs) 220 lb 0.3 oz  177 lb 7.5 oz  Weight (kg) 99.8 kg  80.5 kg     Information is confidential and restricted. Go to Review Flowsheets to unlock data.     Body mass index is 35.51 kg/m.  General:  Well nourished, well developed, in no acute distress HEENT: normal Lymph: no adenopathy Neck: no JVD Endocrine:  No thryomegaly Vascular: No carotid bruits; FA pulses 2+ bilaterally without bruits  Cardiac:  normal S1, S2; RRR; no murmur  Lungs:  clear to auscultation bilaterally, no wheezing, rhonchi or rales  Ext: no edema Skin: warm and dry  Neuro:  [name] 2/5 strength, remaining WNLs  Psych:  Normal affect   EKG:  The EKG (10/30/24) was personally reviewed and demonstrates: ST 103, PR 150, QRS 115, QT/c 379/497 Telemetry:  Telemetry was personally reviewed and demonstrates: NSR 80-90s to ST 110s  Relevant CV Studies:  TTE Result date: 10/31/24  1. Left ventricular ejection fraction, by estimation, is 30%. Left  ventricular ejection fraction by 2D MOD biplane is 29.1 %. The left  ventricle has moderately decreased function. The left ventricle  demonstrates regional wall motion abnormalities (see  scoring diagram/findings for description). There is moderate asymmetric  left ventricular hypertrophy of the septal segment. Indeterminate  diastolic filling due  to E-A fusion.   2. Right ventricular systolic function is normal. The right ventricular  size is normal. Tricuspid regurgitation signal is inadequate for assessing  PA pressure.   3. The mitral valve is degenerative. Mild mitral valve regurgitation. No  evidence of mitral stenosis.    4. The aortic valve was not well visualized. Aortic valve regurgitation  is not visualized. No aortic stenosis is present.   5. The inferior vena cava is normal in size with greater than 50%  respiratory variability, suggesting right atrial pressure of 3 mmHg.   Laboratory Data:  Chemistry Recent Labs  Lab 10/30/24 2210 10/31/24 0257 10/31/24 0258  NA 143  --  143  K 4.4  --  3.8  CL 108  --  109  CO2 25  --  24  GLUCOSE 116*  --  120*  BUN 24*  --  24*  CREATININE 3.61* 3.85* 3.86*  CALCIUM  8.7*  --  8.2*  GFRNONAA 19* 18* 17*  ANIONGAP 10  --  10    Recent Labs  Lab 10/30/24 2210  PROT 6.3*  ALBUMIN 3.1*  AST 35  ALT 16  ALKPHOS 70  BILITOT 0.2   Hematology Recent Labs  Lab 10/30/24 2210 10/31/24 0257  WBC 7.2 10.3  RBC 4.43 3.92*  HGB 14.1 12.6*  HCT 43.3 38.0*  MCV 97.7 96.9  MCH 31.8 32.1  MCHC 32.6 33.2  RDW 14.6 14.5  PLT 249 232   BNP Recent Labs  Lab 10/30/24 2210  PROBNP 6,550.0*   Radiology/Studies:  US  RENAL Result Date: 10/31/2024 EXAM: US  Retroperitoneum Complete, Renal. 10/31/2024 10:51:00 AM TECHNIQUE: Real-time ultrasonography of the retroperitoneum renal was performed. COMPARISON: None available CLINICAL HISTORY: 409830 AKI (acute kidney injury) 409830 AKI (acute kidney injury) FINDINGS: FINDINGS: RIGHT KIDNEY/URETER: The right kidney measures 10.2 x 5.8 x 4.3 cm with a volume of 131.9 cc. Increased parenchymal echogenicity. No hydronephrosis. No calculus. No mass. LEFT KIDNEY/URETER: The left kidney measures 10.6 x 6.0 x 4.1 cm with a volume of 137.6 cc. Increased parenchymal echogenicity. No hydronephrosis. No calculus. No mass. BLADDER: The bladder appears normal with visualization of both ureteral jets. IMPRESSION: 1. Increased bilateral renal parenchymal echogenicity, compatible with medical renal disease 2. No signs of hydronephrosis. Electronically signed by: Waddell Calk MD 10/31/2024 11:20 AM EST RP Workstation: [id number]    ECHOCARDIOGRAM COMPLETE Result Date: 10/31/2024    ECHOCARDIOGRAM REPORT   Patient Name:   DAESEAN LAZARZ Date of Exam: 10/31/2024 Medical Rec #:  [id number]     Height:       66.0 in Accession #:    [id number]    Weight:       220.0 lb Date of Birth:  [birth year]     BSA:          2.083 m Patient Age:    58 years      BP:           154/109 mmHg Patient Gender: M             HR:           104 bpm. Exam Location:  Inpatient Procedure: 2D Echo, Cardiac Doppler and Color Doppler (Both Spectral and Color            Flow Doppler were utilized during procedure). STAT ECHO Indications:    I50.31 Acute diastolic (congestive) heart failure  History:        Patient has prior history of Echocardiogram examinations, most  recent 06/19/2023. Risk Factors:Hypertension, Dyslipidemia, ETOH,                 Diabetes and Sleep Apnea.  Sonographer:    Damien Senior RDCS Referring Phys: [id number] FONDA JAYSON SHARPS IMPRESSIONS  1. Left ventricular ejection fraction, by estimation, is 30%. Left ventricular ejection fraction by 2D MOD biplane is 29.1 %. The left ventricle has moderately decreased function. The left ventricle demonstrates regional wall motion abnormalities (see scoring diagram/findings for description). There is moderate asymmetric left ventricular hypertrophy of the septal segment. Indeterminate diastolic filling due to E-A fusion.  2. Right ventricular systolic function is normal. The right ventricular size is normal. Tricuspid regurgitation signal is inadequate for assessing PA pressure.  3. The mitral valve is degenerative. Mild mitral valve regurgitation. No evidence of mitral stenosis.  4. The aortic valve was not well visualized. Aortic valve regurgitation is not visualized. No aortic stenosis is present.  5. The inferior vena cava is normal in size with greater than 50% respiratory variability, suggesting right atrial pressure of 3 mmHg. FINDINGS  Left Ventricle: Left ventricular ejection fraction, by  estimation, is 30%. Left ventricular ejection fraction by 2D MOD biplane is 29.1 %. The left ventricle has moderately decreased function. The left ventricle demonstrates regional wall motion abnormalities. The left ventricular internal cavity size was normal in size. There is moderate asymmetric left ventricular hypertrophy of the septal segment. Indeterminate diastolic filling due to E-A fusion.  LV Wall Scoring: The mid and distal lateral wall, posterior wall, and basal inferior segment are akinetic. The antero-lateral wall and mid inferior segment are hypokinetic. Right Ventricle: The right ventricular size is normal. Right vetricular wall thickness was not well visualized. Right ventricular systolic function is normal. Tricuspid regurgitation signal is inadequate for assessing PA pressure. Left Atrium: Left atrial size was normal in size. Right Atrium: Right atrial size was normal in size. Pericardium: There is no evidence of pericardial effusion. Mitral Valve: The mitral valve is degenerative in appearance. Mild mitral valve regurgitation. No evidence of mitral valve stenosis. Tricuspid Valve: The tricuspid valve is normal in structure. Tricuspid valve regurgitation is trivial. No evidence of tricuspid stenosis. Aortic Valve: The aortic valve was not well visualized. Aortic valve regurgitation is not visualized. No aortic stenosis is present. Pulmonic Valve: The pulmonic valve was not well visualized. Pulmonic valve regurgitation is not visualized. No evidence of pulmonic stenosis. Aorta: The aortic root is normal in size and structure and the ascending aorta was not well visualized. Venous: The inferior vena cava is normal in size with greater than 50% respiratory variability, suggesting right atrial pressure of 3 mmHg. IAS/Shunts: No atrial level shunt detected by color flow Doppler.  LEFT VENTRICLE PLAX 2D                        Biplane EF (MOD) LVIDd:         4.90 cm         LV Biplane EF:   Left LVIDs:          4.50 cm                          ventricular LV PW:         1.10 cm                          ejection LV IVS:  1.40 cm                          fraction by LVOT diam:     1.97 cm                          2D MOD LV SV:         36                               biplane is LV SV Index:   17                               29.1 %. LVOT Area:     3.05 cm  LV Volumes (MOD) LV vol d, MOD    128.0 ml A2C: LV vol d, MOD    122.0 ml A4C: LV vol s, MOD    85.0 ml A2C: LV vol s, MOD    93.9 ml A4C: LV SV MOD A2C:   43.0 ml LV SV MOD A4C:   122.0 ml LV SV MOD BP:    37.3 ml RIGHT VENTRICLE RV S prime:     14.60 cm/s TAPSE (M-mode): 1.7 cm LEFT ATRIUM             Index        RIGHT ATRIUM           Index LA diam:        4.63 cm 2.22 cm/m   RA Area:     14.30 cm LA Vol (A2C):   53.4 ml 25.64 ml/m  RA Volume:   32.40 ml  15.56 ml/m LA Vol (A4C):   72.3 ml 34.72 ml/m LA Biplane Vol: 65.0 ml 31.21 ml/m  AORTIC VALVE LVOT Vmax:   101.00 cm/s LVOT Vmean:  66.400 cm/s LVOT VTI:    0.119 m  AORTA Ao Root diam: 3.14 cm  SHUNTS Systemic VTI:  0.12 m Systemic Diam: 1.97 cm Soyla Merck MD Electronically signed by Soyla Merck MD Signature Date/Time: 10/31/2024/9:05:11 AM    Final    DG Chest Port 1 View Result Date: 10/30/2024 EXAM: 1 VIEW(S) XRAY OF THE CHEST 10/30/2024 09:53:49 PM COMPARISON: Comparison study 01/29/2024. CLINICAL HISTORY: Peripheral edema and shortness of breath. FINDINGS: LUNGS AND PLEURA: Increased vascular congestion is noted with interstitial edema. No infiltrate is noted. No sizable effusion is seen. HEART AND MEDIASTINUM: Cardiac shadow is enlarged. Aortic calcifications are seen. BONES AND SOFT TISSUES: No bony abnormality is seen. IMPRESSION: 1. Increased vascular congestion with interstitial edema. No sizable effusion or infiltrate. Electronically signed by: Oneil Devonshire MD 10/30/2024 09:56 PM EST RP Workstation: [id number]   Assessment and Plan:  HELDER CRISAFULLI is a 58 y.o. male HTN,  prior CVA in 2016, left-sided deficits, PAD, CKD, DM2, OSA who was evaluated for HTN urgency.  HTN urgency HFrEF SBP 180s during my evaluation with sinus tachycardia in the 115s.  He is prescribed amlodipine  10 mg daily, clonidine  0.3 mg/day, hydral 25 mg twice daily, labetalol  300 mg twice daily but has not been on any of his medications in at least a month if not longer secondary to insurance issues.  He was saying it probably has been closer to a few months since he has been on the medication at all but he has  not had any recent issues with shortness of breath.  His symptoms all resolved with blood pressure control.  He did not have typical anginal symptoms however his TTE today with decline in his LVEF to 30% and regional wall motion abnormalities that are different from 2024.  He did have prior basal inferior posterior hypokinesis.  For now would medically manage and assess for improvement of LVEF and would defer invasive evaluation unless he has significant symptoms despite adequate blood pressure control and/or his LVEF does not improve with GDMT. - start Entresto  97-103 mg bid 11/24 AM  - s/p labetalol  300 mg on 11/23 at 1312, transition to toprol  XL for GDMT once BP adequately controlled  - transition off hydral and uptitrate toprol  XL once off labatalol  - d/c amlodipine   - continue DAPT for PVD - continue atorva 80 mg daily  - GLP1 and spiro not ideal with CKD   For questions or updates, please contact [facility] Please consult [email or web address] for contact info under   Signed, Donnice DELENA Primus, MD  10/31/2024 7:22 PM

## 2024-11-01 ENCOUNTER — Other Ambulatory Visit (HOSPITAL_COMMUNITY): Payer: Self-pay

## 2024-11-01 ENCOUNTER — Telehealth (HOSPITAL_COMMUNITY): Payer: Self-pay | Admitting: Pharmacy Technician

## 2024-11-01 DIAGNOSIS — I161 Hypertensive emergency: Secondary | ICD-10-CM

## 2024-11-01 DIAGNOSIS — I5041 Acute combined systolic (congestive) and diastolic (congestive) heart failure: Secondary | ICD-10-CM | POA: Diagnosis not present

## 2024-11-01 DIAGNOSIS — N179 Acute kidney failure, unspecified: Secondary | ICD-10-CM

## 2024-11-01 DIAGNOSIS — I5021 Acute systolic (congestive) heart failure: Secondary | ICD-10-CM

## 2024-11-01 LAB — CBC
HCT: 31.8 % — ABNORMAL LOW (ref 39.0–52.0)
Hemoglobin: 10.5 g/dL — ABNORMAL LOW (ref 13.0–17.0)
MCH: 31.7 pg (ref 26.0–34.0)
MCHC: 33 g/dL (ref 30.0–36.0)
MCV: 96.1 fL (ref 80.0–100.0)
Platelets: 183 K/uL (ref 150–400)
RBC: 3.31 MIL/uL — ABNORMAL LOW (ref 4.22–5.81)
RDW: 14.4 % (ref 11.5–15.5)
WBC: 7.7 K/uL (ref 4.0–10.5)
nRBC: 0 % (ref 0.0–0.2)

## 2024-11-01 LAB — GLUCOSE, CAPILLARY
Glucose-Capillary: 189 mg/dL — ABNORMAL HIGH (ref 70–99)
Glucose-Capillary: 180 mg/dL — ABNORMAL HIGH (ref 70–99)
Glucose-Capillary: 107 mg/dL — ABNORMAL HIGH (ref 70–99)

## 2024-11-01 LAB — BASIC METABOLIC PANEL WITH GFR
Anion gap: 10 (ref 5–15)
BUN: 33 mg/dL — ABNORMAL HIGH (ref 6–20)
CO2: 22 mmol/L (ref 22–32)
Calcium: 8.1 mg/dL — ABNORMAL LOW (ref 8.9–10.3)
Chloride: 106 mmol/L (ref 98–111)
Creatinine, Ser: 4.88 mg/dL — ABNORMAL HIGH (ref 0.61–1.24)
GFR, Estimated: 13 mL/min — ABNORMAL LOW (ref >60)
Glucose, Bld: 117 mg/dL — ABNORMAL HIGH (ref 70–99)
Potassium: 4.3 mmol/L (ref 3.5–5.1)
Sodium: 138 mmol/L (ref 135–145)

## 2024-11-01 LAB — MAGNESIUM: Magnesium: 2.3 mg/dL (ref 1.7–2.4)

## 2024-11-01 MED ORDER — CYCLOBENZAPRINE HCL 5 MG PO TABS
5.0000 mg | ORAL_TABLET | Freq: Three times a day (TID) | ORAL | Status: DC | PRN
  Administered 2024-11-01 – 2024-11-04 (×5): 5 mg via ORAL
  Filled 2024-11-01 (×5): qty 1

## 2024-11-01 MED ORDER — OXYCODONE HCL 5 MG PO TABS
5.0000 mg | ORAL_TABLET | Freq: Four times a day (QID) | ORAL | Status: DC | PRN
  Administered 2024-11-01 – 2024-11-05 (×9): 5 mg via ORAL
  Filled 2024-11-01 (×9): qty 1

## 2024-11-01 MED ORDER — AMLODIPINE BESYLATE 10 MG PO TABS
10.0000 mg | ORAL_TABLET | Freq: Every day | ORAL | Status: DC
  Administered 2024-11-01 – 2024-11-05 (×5): 10 mg via ORAL
  Filled 2024-11-01 (×6): qty 1

## 2024-11-01 MED ORDER — CARVEDILOL 12.5 MG PO TABS
12.5000 mg | ORAL_TABLET | Freq: Two times a day (BID) | ORAL | Status: DC
  Administered 2024-11-01 – 2024-11-02 (×3): 12.5 mg via ORAL
  Filled 2024-11-01 (×3): qty 1

## 2024-11-01 NOTE — Telephone Encounter (Signed)
 Patient Product/process Development Scientist completed.    The patient is insured through [facility]. Patient has Medicare and is not eligible for a copay card, but may be able to apply for patient assistance or Medicare RX Payment Plan (Patient Must reach out to their plan, if eligible for payment plan), if available.    Ran test claim for isosorbide -hydralazine  20-37.5 mg and the current 30 day co-pay is $0.00.   This test claim was processed through [facility]- copay amounts may vary at other pharmacies due to pharmacy/plan contracts, or as the patient moves through the different stages of their insurance plan.     Reyes Sharps, CPHT Pharmacy Technician Patient Advocate Specialist Lead [facility] Patient Advocate Team Direct Number: [phone]  Fax: [phone]

## 2024-11-01 NOTE — Evaluation (Signed)
 Physical Therapy Evaluation Patient Details Name: Taylor Obrien MRN: [id number] DOB: September 29, 1966 Today's Date: 11/01/2024  History of Present Illness  Pt is a 58 y/o M admitted on 10/30/24 after presenting with c/o SOB, BLE swelling & chest tightness. Pt is being treated for hypertensive emergency, acute on chronic CHF exacerbation. PMH: HTN, PVD, CVA, CKD 3, HLD, DM2, alcohol abuse, alcoholic pancreatitis, MDD, suicidal ideation (admission for SI 04/21/24-04/26/24), GERD  Clinical Impression  Pt seen for PT evaluation with pt agreeable to tx, eager to get OOB to recliner. Pt reports prior to admission he was living in apartment with family there PRN. Pt reports he was able to complete transfers to/from w/c without assistance, propelled w/c without assistance. On this date, pt requires MAX assist for bed mobility, +2 to transfer bed>recliner with use of stedy. Pt with ongoing LOB to R with poor ability to correct despite ongoing max multimodal cuing (pt unable to report if this is baseline or not, but notes L sided weakness is chronic - MD made aware). Recommend ongoing PT services to progress mobility as able. Recommend post acute rehab <3 hours therapy/day upon d/c.       If plan is discharge home, recommend the following: Two people to help with bathing/dressing/bathroom;Two people to help with walking and/or transfers   Can travel by private vehicle   No    Equipment Recommendations [place] lift;Hospital bed  Recommendations for Other Services  OT consult    Functional Status Assessment Patient has had a recent decline in their functional status and demonstrates the ability to make significant improvements in function in a reasonable and predictable amount of time.     Precautions / Restrictions Precautions Precautions: Fall Restrictions Weight Bearing Restrictions Per Provider Order: No      Mobility  Bed Mobility Overal bed mobility: Needs Assistance Bed Mobility: Supine to Sit      Supine to sit: Max assist, Used rails, HOB elevated (exit R side of bed)          Transfers Overall transfer level: Needs assistance Equipment used: Ambulation equipment used Transfers: Bed to chair/wheelchair/BSC               Transfer via Lift Equipment: Stedy (+2 for assistance to power up to full standing & shift pelvis anteriorly to allow stedy flaps to fold down)  Ambulation/Gait                  Stairs            Wheelchair Mobility     Tilt Bed    Modified Rankin (Stroke Patients Only)       Balance Overall balance assessment: Needs assistance Sitting-balance support: Feet supported Sitting balance-Leahy Scale: Poor Sitting balance - Comments: min<>max assist for static sitting EOB, frequent LOB to R with difficulty to correct (had pt reach outside of BOS to L, attempt to lean on L elbow to correct but poor return demo)                                     Pertinent Vitals/Pain Pain Assessment Pain Assessment: Faces Faces Pain Scale: Hurts little more Pain Location: back, butt Pain Descriptors / Indicators: Discomfort Pain Intervention(s): Monitored during session    Home Living Family/patient expects to be discharged to:: Private residence Living Arrangements: Children Available Help at Discharge: Available PRN/intermittently Type of Home: Apartment Home Access: Ramped entrance  Home Layout: One level Home Equipment: Wheelchair - manual      Prior Function               Mobility Comments: Pt reports he's non-ambulatory, transfers to/from w/c without assistance, goes to the nearest store in his w/c. ADLs Comments: Sink bathes without assistanc.e     Extremity/Trunk Assessment   Upper Extremity Assessment Upper Extremity Assessment: Generalized weakness    Lower Extremity Assessment Lower Extremity Assessment: Generalized weakness (reports chronic [name] weakness 2/2 hx of CVA)        Communication   Communication Communication: Impaired Factors Affecting Communication: Reduced clarity of speech (reports he had his teeth pulled in the past)    Cognition Arousal: Alert Behavior During Therapy: Impulsive   PT - Cognitive impairments: Problem solving, Safety/Judgement, No family/caregiver present to determine baseline                       PT - Cognition Comments: increased processing time Following commands: Impaired Following commands impaired: Follows multi-step commands inconsistently, Follows multi-step commands with increased time     Cueing Cueing Techniques: Verbal cues     General Comments      Exercises     Assessment/Plan    PT Assessment Patient needs continued PT services  PT Problem List Decreased strength;Decreased cognition;Decreased activity tolerance;Decreased mobility;Decreased balance;Decreased safety awareness;Decreased knowledge of use of DME;Decreased range of motion;Decreased coordination       PT Treatment Interventions DME instruction;Balance training;Neuromuscular re-education;Functional mobility training;Therapeutic activities;Patient/family education;Wheelchair mobility training;Therapeutic exercise    PT Goals (Current goals can be found in the Care Plan section)  Acute Rehab PT Goals Patient Stated Goal: get better PT Goal Formulation: With patient Time For Goal Achievement: 11/15/24 Potential to Achieve Goals: Fair Additional Goals Additional Goal #1: Pt will propel w/c x 150 ft with supervision for increased independence with mobility.    Frequency Min 2X/week     Co-evaluation               AM-PAC PT 6 Clicks Mobility  Outcome Measure Help needed turning from your back to your side while in a flat bed without using bedrails?: A Lot Help needed moving from lying on your back to sitting on the side of a flat bed without using bedrails?: Total Help needed moving to and from a bed to a chair  (including a wheelchair)?: Total Help needed standing up from a chair using your arms (e.g., wheelchair or bedside chair)?: Total Help needed to walk in hospital room?: Total Help needed climbing 3-5 steps with a railing? : Total 6 Click Score: 7    End of Session   Activity Tolerance: Patient tolerated treatment well Patient left: in chair;with chair alarm set;with call bell/phone within reach Nurse Communication: Mobility status PT Visit Diagnosis: Muscle weakness (generalized) (M62.81);Other abnormalities of gait and mobility (R26.89);Difficulty in walking, not elsewhere classified (R26.2);Unsteadiness on feet (R26.81)    Time: 1534-1550 PT Time Calculation (min) (ACUTE ONLY): 16 min   Charges:   PT Evaluation $PT Eval Low Complexity: 1 Low   PT General Charges $$ ACUTE PT VISIT: 1 Visit         Richerd Pinal, PT, DPT 11/01/24, 4:00 PM   Richerd CHRISTELLA Pinal 11/01/2024, 3:58 PM

## 2024-11-01 NOTE — Progress Notes (Signed)
 Patient refused, assessment, vital signs, CBG monitoring, and morning medications (See MAR). This RN educated the patient about the importance and risks of not having these tasks performed. Patient still refuses. MD notified.  Metta Moats, BSN RN

## 2024-11-01 NOTE — Plan of Care (Signed)
  Problem: Education: Goal: Knowledge of General Education information will improve Description: Including pain rating scale, medication(s)/side effects and non-pharmacologic comfort measures Outcome: Progressing   Problem: Clinical Measurements: Goal: Will remain free from infection Outcome: Progressing   Problem: Coping: Goal: Level of anxiety will decrease Outcome: Progressing   Problem: Pain Managment: Goal: General experience of comfort will improve and/or be controlled Outcome: Progressing

## 2024-11-01 NOTE — TOC Initial Note (Signed)
 Transition of Care [facility]) - Initial/Assessment Note    Patient Details  Name: Taylor Obrien MRN: [id number] Date of Birth: [birth year]  Transition of [facility]) CM/SW Contact:    [name], LCSWA Phone Number: 11/01/2024, 12:22 PM  Clinical Narrative:  Pt is from home w/ fam. Pt reports they are partially independent but are able to drive. Pt stated that they have to use a wc and need assistance with transfers but their son works full time and their daughters do not provide the care that is needed. Pt reports when going to [employer] they are able to get in and out by themselves and get the food in their vehicle.   Pt reported that he has 14 meds he is supposed to take but is not able to get them, but only takes tylenol . CSW inquired what the barrier was and no clear answer was given. Pt would often start talking about a completely different during the initial assessment.   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 Pt stated that there were times they went hungry or were not able to get food or struggle with paying rent. CSW asked for permission to supply resources. Pt was agreeable, resources in AVS.   Pt mentioned that he had a hard time comprehending things on his own. Pt also stated that he has not had a bath in 2 months and can only do wash ups. CSW expressed concern and inquired if they had made an APS report and the pt stated that a social services worker make a visit to his home last week and that he has a case manager Tiffany Dillard but was unable to say what agency.   CSW will continue to follow for needs.   Expected Discharge Plan: Home/Self Care Barriers to Discharge: Continued Medical Work up, Family Issues   Patient Goals and CMS Choice Patient states their goals for this hospitalization and ongoing recovery are:: To walk again   Choice offered to / list presented to : NA      Expected Discharge Plan and Services In-house  Referral: Clinical Social Work     Living arrangements for the past 2 months: Single Family Home                                      Prior Living Arrangements/Services Living arrangements for the past 2 months: Single Family Home Lives with:: Adult Children Patient language and need for interpreter reviewed:: Yes Do you feel safe going back to the place where you live?: Yes      Need for Family Participation in Patient Care: Yes (Comment) Care giver support system in place?: No (comment) Current home services: DME Criminal Activity/Legal Involvement Pertinent to Current Situation/Hospitalization: No - Comment as needed  Activities of Daily Living      Permission Sought/Granted Permission sought to share information with : Family Supports Permission granted to share information with : Yes, Verbal Permission Granted  Share Information with NAME: Norleen Gearing     Permission granted to share info w Relationship: Brother  Permission granted to share info w Contact Information: [phone]  Emotional Assessment Appearance:: Appears older than stated age Attitude/Demeanor/Rapport: Engaged Affect (typically observed): Appropriate, Pleasant Orientation: : Oriented to Self, Oriented to Place, Oriented to  Time, Oriented to Situation Alcohol / Substance Use: Not Applicable Psych Involvement: No (comment)  Admission  diagnosis:  AKI (acute kidney injury) [N17.9] Hypertensive emergency [I16.1] Acute congestive heart failure, unspecified heart failure type [facility]) [I50.9] Patient Active Problem List   Diagnosis Date Noted   Hypertensive emergency 10/31/2024   Suicidal ideation 04/21/2024   MDD (major depressive disorder), recurrent severe, 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   Intentional diphenhydramine  overdose (HCC) 04/19/2024   Pancreatic duct dilated 01/31/2024   Hypertensive urgency 01/30/2024   Chronic kidney disease, stage 3b (HCC) 01/30/2024   Type 2 diabetes  mellitus with hyperglycemia (HCC) 01/30/2024   History of right ACA stroke 01/30/2024   Anxiety 01/30/2024   Fall at home, initial encounter 06/18/2023   Acute kidney injury superimposed on chronic kidney disease 06/18/2023   Acute pancreatitis 07/19/2022   GERD (gastroesophageal reflux disease)    Iron deficiency anemia    Constipation    Abdominal pain 06/14/2022   Nausea and vomiting 06/14/2022   Elevated lipase 06/14/2022   Hypoalbuminemia due to protein-calorie malnutrition 06/14/2022   Stage 3a chronic kidney disease (CKD) (HCC) 06/04/2022   Anemia    Acute pancreatitis without infection or necrosis    Transaminasemia 04/29/2022   Class 1 obesity 04/29/2022   Thrombocytosis 04/26/2022   Acute alcoholic pancreatitis 04/26/2022   Diabetes mellitus (HCC) 04/24/2022   Post-operative state 01/24/2018   Spastic hemiplegia affecting nondominant side (HCC) 06/03/2016   Dysuria    OSA (obstructive sleep apnea)    Hypokalemia    Elevated blood pressure    Hemiparesis affecting left side as late effect of stroke (HCC)    Epistaxis    HTN (hypertension) 11/27/2015   Acute renal failure superimposed on stage 3a chronic kidney disease (HCC) 11/27/2015   Hemiplegia and hemiparesis following unspecified cerebrovascular disease affecting left non-dominant side (HCC) 11/23/2015   Gait disturbance, post-stroke 11/23/2015   Cerebrovascular accident (CVA) due to occlusion of right anterior cerebral artery (HCC)    Essential hypertension    Depression    Chronic pain syndrome    ETOH abuse    Marijuana abuse    Cerebrovascular accident (CVA) due to thrombosis of right carotid artery (HCC)    Malignant hypertension    Mixed hyperlipidemia    Cerebral infarction (HCC) 11/18/2015   Stroke (cerebrum) (HCC) 11/18/2015   Chest pain 10/09/2012   Chronic back pain 10/09/2012   Tobacco abuse 10/09/2012   Hypertensive heart disease 08/31/2012   Accelerated hypertension 08/31/2012   Precordial  pain 08/31/2012   PCP:  Trudy Ave, NP Pharmacy:   [facility] - [place], [place] - [address] [place] [address] Phone: [phone] Fax: [phone]     Social Drivers of Health (SDOH) Social History: SDOH Screenings   Food Insecurity: Food Insecurity Present (10/31/2024)  Housing: Low Risk  (10/31/2024)  Transportation Needs: No Transportation Needs (10/31/2024)  Utilities: Not At Risk (10/31/2024)  Alcohol Screen: High Risk (04/21/2024)  Tobacco Use: High Risk (10/30/2024)   SDOH Interventions:     Readmission Risk Interventions    04/26/2022   11:38 AM 04/25/2022    9:38 AM  Readmission Risk Prevention Plan  Transportation Screening Complete Complete  Home Care Screening  Complete  Medication Review (RN CM)  Complete  HRI or Home Care Consult Complete   Social Work Consult for Recovery Care Planning/Counseling Complete   Palliative Care Screening Not Applicable   Medication Review Oceanographer) Complete

## 2024-11-01 NOTE — Progress Notes (Addendum)
 Progress Note  Patient Name: Taylor Obrien Date of Encounter: 11/01/2024 [facility] Cardiologist: None   Interval Summary   worsening creatinine today after starting on Entresto .  He has no concerns other than leg spasms.  No chest pain or shortness of breath.  Vital Signs Vitals:   10/31/24 2009 10/31/24 2100 11/01/24 0404 11/01/24 0500  BP:  (!) 160/95 123/86   Pulse:  100 88   Resp:  19 19   Temp: 97.8 F (36.6 C) 98.3 F (36.8 C) 98.1 F (36.7 C)   TempSrc: Oral     SpO2:  94% 94%   Weight:    76.6 kg  Height:        Intake/Output Summary (Last 24 hours) at 11/01/2024 1121 Last data filed at 11/01/2024 0906 Gross per 24 hour  Intake 84.8 ml  Output 500 ml  Net -415.2 ml      11/01/2024    5:00 AM 10/30/2024    9:14 PM 04/21/2024    7:00 PM  Last 3 Weights  Weight (lbs) 168 lb 14 oz 220 lb 0.3 oz   Weight (kg) 76.6 kg 99.8 kg      Information is confidential and restricted. Go to Review Flowsheets to unlock data.      Telemetry/ECG  Sinus rhythm- Personally Reviewed  Physical Exam  GEN: No acute distress.   Neck: No JVD Cardiac: RRR, no murmurs, rubs, or gallops.  Respiratory: +crackles GI: Soft, nontender, non-distended  MS: No edema  Patient Profile Patient with past medical history significant for prior CVA 2016, hypertension, PAD, CKD, type 2 diabetes, OSA.  Patient has been out of his medications for approximately 6 weeks.  He is wheelchair dependent and reliant upon his daughter to give him his medications and she was not doing so.  Assessment & Plan   New HFrEF -06/2023 EF 50 to 55% with moderate asymmetric LVH with wall motion abnormalities   Echocardiogram this admission shows newly reduced EF 30% with moderate asymmetric LVH.  RV was normal.  Mild MR.  He has wall motion abnormalities lateral, posterior and inferior wall which are akinetic.  Anterolateral wall and inferior segment hypokinetic.  Similar to previous  echocardiogram, will review with MD if felt to be suggestive of ischemia.  Etiology of this may be related to his hypertensive emergency.  He has had no chest pain but ischemic disease is certainly on the differential.  Looks euvolemic and without any complaints of shortness of breath but he does have some crackles on exam.  With AKI and lack of symptoms would not pursue diuresis currently. With his AKI on CKD and lack of symptoms would not pursue ischemic testing during this admission.  Would treat underlying hypertension and repeat echocardiogram outpatient after starting GDMT.  Hold off on further diuresis for now GDMT limited by CKD: Will transition labetalol  300 mg twice daily to carvedilol .  Will discuss with pharmacy but suspect we could do carvedilol  12.5 mg twice daily and titrate further.  Stopping Entresto .  Continue hydralazine  25 mg twice daily.  Tomorrow may add Imdur depending on how he responds on current regiment.  Also on amlodipine  10 mg which will be resumed today. With his CKD, metoprolol  succinate may be a better option than carvedilol -Will see how his renal function stabilizes out. Previous plans for Entresto  aborted with the worsening creatinine, I agree with the need for hydralazine /nitrate.  His blood pressure has improved today, and we are restarting amlodipine  we will hold  off on nitrate.  => Anticipate starting Isordil  tomorrow with plans to convert hydralazine  and Isordil  to BiDil  if acceptable with no cause assessment.  Hypertensive emergency Blood pressure around 210 systolics on arrival.  Much more improved today 123/86 with the last reading.  Has had AKI overnight.  Likely related to his LVH  Elevated troponins 197-181. Downtrending troponins not indicative of ACS.  Likely related to hypertensive emergency.  AKI on CKD Creatinine elevated overnight, only got one dose of Entresto  today.  Creatinine increase may be lagging indicator from his hypertensive emergency.  May  be beneficial to get nephrology involved given creatinine now greater than 4.  PAD Follows with vascular with Moderate PAD with preserved toe pressure.  Not felt to be a revascularization candidate and he is no longer ambulating and wheelchair dependent.   Appears he has been on DAPT therapy with aspirin  and Plavix  for this.  Will defer to them about long-term DAPT. Continue with atorvastatin  80 mg. LDL 9 months ago was 190.  Goal for him probably should be less than 70.  Will obtain updated lipid panel.  History of CVA   For questions or updates, please contact [facility] Please consult [email or web address] for contact info under       Signed, Thom LITTIE Sluder, PA-C      ATTENDING ATTESTATION  I have seen, examined and evaluated the patient earlier this afternoon along with Thom Sluder, PA.  After reviewing all the available data and chart, we discussed the patients laboratory, study & physical findings as well as symptoms in detail.  I agree with his findings, examination as well as impression recommendations as per our discussion.    Attending adjustments noted in italics.   Difficult scenario, we need to figure out what medications he could be on.  His reduced EF may very well be related to hypertensive urgency.  And with their worsening renal function no plans for invasive ischemic evaluation at this time.  Would like to reassess his EF once his blood pressure is more stabilized as I suspect that this could all be related to hypertensive heart disease.  At this point work somewhat limited by his worsening renal function, but clearly Entresto  is not an option leaving hydralazine /nitrate (potentially BiDil ) as a surrogate.  He had been on amlodipine  which I think we can continue.  Labetalol  somewhat unfavorable as an option although maybe from a cost perspective the choice.  With his worsening renal function I suspected we may need to convert actually to Toprol  as opposed to  carvedilol  but will monitor his renal function.    Alm MICAEL Clay, MD, MS [name], M.D., M.S. Interventional Cardiologist  [facility] Pager # [phone]

## 2024-11-01 NOTE — Discharge Instructions (Signed)
 Taylor Obrien  [facility] Address: [address] [place], [place] [address] Phone Number: [phone] Hours of Operation: Residents of [place] can come to obtain food Monday through Friday from 8:30am until 3:30pm. Photo ID and Social Security cards required for all residents of a household. Can come six times a year  [facility] Address: [address] [place], [place] [address] Phone Number: [phone] Hours of Operation: Operates Tuesday-Friday 10:00 a.m. to 1 p.m. Requirements: Referral from DSS needed. May come 6 times a year, 30 days apart. Photo ID and SS required for all residents of household.  [facility] Address: [address] [place], [place] [address] Phone Number: [phone] Hours of Operation: Food pantry is open on the last Saturday of each month from 10:00 am - 12:00 noon. No appointment needed. No qualifications.  [facility] Address: [address] , [place] [address] Phone Number: [phone] EXT. 21 Hours of Operation: Must make reservations to pick up food on Saturdays. Sign ups for Saturday pick up beginning at 8:30 a.m. on Monday morning.  [facility] Address: [address] [place], [place] [address] Phone Number: [phone] Hours of Operation: If you need food, bring proper identification such as a driver's license to receive a bag of food once a month. Requirements: Can come once every 30 days with referral DSS, Holiday Representative, Mental health etc. Each referral good for six visits. Photo ID required. *1st visit no referral required.  [facility] Address: [address] [place], [place] [address] Phone Number: [phone]  [facility] Address: [address] [place], [place] [address] Phone Number: [phone] Hours of Operation:  You can register at [email or web address] for free groceries  [facility]  [facility] Address: [address] [place], [place] [address] Phone Number: [phone] Hours of Operation: Drive through giveaway, first come first served. Every 3rd Saturday 11AM - 1PM  [facility] [address] Address: [address] [place] [address] Phone Number: [phone]   [place]  [facility] Address: [address] [place], [place] [address] Phone Number: [phone] Hours of Operation: Once a month every 3rd Saturday  [facility] Address: [address] [place], [place] [address] Phone Number: [phone] Hours of Operation: Distribution happens from 9:00-10:00 a.m. every Saturday.     [facility] Address: [address] [place], [place] [address] Phone Number: [phone] Hours of Operation: ONCE a week for the community food distribution held every Tuesday, Wednesday and Thursday from 11 a.m. - 2:00 p.m. Food is available on a first come, first serve basis and varies week to week. No appointment necessary for drive thru pick up.  [facility] Address: [address] [place], [place] [address] Phone Number: [phone] Hours of Operation: Open every 3rd Thursday 9:30 a.m. - 11:00 a.m.  [facility] Address: [address] [place], [place] [address] Phone Number: [phone] Hours of Operation: Please call for hours, directions, and questions  [facility] [place] [facility] Address: [address], [place], [place]  [address] Phone Number: [phone] Website: https://www.hollyguns.co.za Food Finder app: https://findfood.ghpfa.org  [facility] Address: [address] [place], [place] [address] Phone Number: [phone] Hours of Operation: Contact Bree Harpe. Enrolled Substance Abuse Clients Only  [facility] Address: [address] [place], [address]  Phone Number: [phone] Hours of Operation: Contact Bartley Irving. Food pantry open the 3rd Saturday of each month  from 9 a.m. -12 p.m. only  [facility] [facility] Address: [address] [place], [place] [address] Phone Number: [phone] Hours of Operation: Contact Geni Lee. Emergency food bank open on Saturdays by appointment only  [facility] Address: [address] [place], [place] [address] Phone Number: [phone] Hours of Operation: No specific contact person; Anyone can help  [facility] Address: [address] [place], [place] [address] Phone Number: [phone] Hours of Operation: Contact Medford Molt. Agency gives out a bag of food every Thursday from 2-4 p.m. only, and also provides a community meal every Thursday between 5-6 p.m. Other services provided include rent/mortgage and utility assistance, women's winter shelter, thrift store, and senior adult activities.  [facility] [place] Address: [address] [place], [place] [address] Phone Number: [phone] Hours of Operation: The Emergency Food Assistance Program provides individuals and families with a generous supply of food including meat, fresh vegetables, and nonperishable items. The food box contains five days' worth of food, and each family or individual can receive a box once per month. M, W, Th, Fr 11am-2pm, walk-ins welcome.  [facility] Address: [address] [place], [place] [address]  Phone Number: [phone] Hours of Operation: Contact Asia Cathlean. Tuesdays and Thursdays from 11am - 3pm by appointment only   Rent/Utility Assistance in [facility]:  [facility] [address], [place], [place] [address] [phone] Mon 8:00am - 6:00pm; Tue 8:00am - 6:00pm; Wed 8:00am - 6:00pm; Thu 8:00am - 6:00pm; Fri 8:00am - 6:00pm; Email: [email or web address] Eligibility: Residents of [place], [place], [place], [place], [place] and [place] that meet income limits. Call or text for eligibility  screening.   [facility] [address] [place], [place], [place] [address] [phone] (Main: Rental Assistance) [phone] (Main: Utility Assistance) Mon 8:30am - 5:00pm; Tue 8:30am - 5:00pm; Wed 8:30am - 5:00pm; Thu 8:30am - 5:00pm; Fri 8:30am - 5:00pm; Website: http://www.greensborourbanministry.org/emergency-assistance-program Eligibility: People who have an unexpected crisis or emergency that can be verified. Must have some form of income and meet income limits. At the first of the month, only helps with rent/mortgage assistance for those who have court ordered eviction notices. Call for application information. Call for exact documents that will be needed. Examples of documents that may be needed: Photo ID, Social Security cards for everyone in the household, and proof of income for previous 2 months. Copy of eviction notice for rent assistance and copy of final notice for utility assistance. Statements or receipts of bills for previous 2 months.   [facility] [place] [address] [place], [place] [address] [phone] (Main) [phone] (Alternate) Mon 9:00am - 5:00pm; Tue 9:00am - 5:00pm; Wed 9:00am - 5:00pm; Thu 9:00am - 5:00pm; Fri 9:00am - 5:00pm; Website: http://[email or web address].[email or web address]/[place]/emergency-financial-assistance Email: [email or web address] Eligibility: People experiencing a housing crisis with past-due rent and/or utilities and meet income limits. Must be willing to take part in 6 Call or visit website to download application. Return complete application by mail or email only. Documents: Help with Utilities: Photo ID, proof of household income, copies of monthly bills or receipts, and a final disconnection/shut-off notice. Help with Rent or Mortgage: Photo ID, proof of income, copies of monthly bills or receipts, and eviction notice. Help with Household Goods: Photo ID, proof of household income,  copies of monthly bills or receipts, and a fire or flood report.  [facility] [place] [address], [employer]  [place], [place] [address] [phone] (Main) Mon 8:00am - 5:00pm; Tue 8:00am - 5:00pm; Wed 8:00am - 5:00pm; Thu 8:00am - 5:00pm; Fri 8:00am - 12:00pm; Website: http://[email or web address].salvationarmy.org/[place]/emergency-financial-assistance Email: [email or web address] Call for eligibility information. Apply :Utilities Assistance: Visit office by 8:30am on 1st and 4th Monday of each month to pick up application. Rent and Mortgage Assistance: Visit office by 8:30am on 2nd and 3rd Monday of each month to pick up application. NOTE: If Monday falls on a holiday applications can be picked up the following Tuesday. Documents required will be listed on application.  [facility] [place] [phone] (Main) Seen by appointment only. Call for more information. Eligibility: Meet income limits. Apply: Call for information on how to schedule an appointment. Each month there is a specific day to call to schedule an appointment. It is stated on the agency voicemail message. Appointments fill up quickly each month. Documents: Photo ID, copy of current utility bill.  [facility] [place] [address], [place], [place] [address] [phone] (Main) Tue 9:00am - 4:00pm; Wed 9:00am - 4:00pm; Thu 9:00am - 4:00pm; Website: http://www.[email or web address] Email: helpinghandsclientassistance@gmail .com Eligibility: Utility Assistance: Meet income limits and be a Holiday Representative. [employer] customers do not qualify. Must not have received utility assistance for another agency within the last 90 days. Rent Assistance: Residents of [place] who meet income limits. Must not have received rent assistance for another agency within the last 90 days. Apply: Call to schedule an appointment. Documents: Utility Assistance: Photo ID, City  [employer], copy of lease (if not paying a mortgage), proof of income, and monthly expenses. Rent Assistance: Photo ID, W-9 from the landlord, copy of the lease, proof of income, and a list of monthly expenses.  [facility] [place] [address] [place], [place] [address] [phone] (Main: Help With Rent) [phone] (Main: Help With Utilities) Mon 9:00am - 4:00pm; Tue 9:00am - 4:00pm; Wed 9:00am - 4:00pm; Thu 9:00am - 4:00pm; Fri 9:00am - 4:00pm; Website: [email or web address].no Email: [email or web address] Eligibility: People experiencing a financial crisis. Apply: Call to schedule an appointment Wednesday, 7:30am. Documents: Photo ID, Social Security card, proof of income, and proof of address. Other documents may be required, depending on service. Call for more information.  [facility] - [facility] [address] [place], [place] [address] [phone] (Main) Mon 8:00am - 5:00pm; Tue 8:00am - 5:00pm; Wed 8:00am - 5:00pm; Thu 8:00am - 5:00pm; Fri 8:00am - 5:00pm; Website: [email or web address] Eligibility: Meet income limits and resource guidelines. Each household is only eligible once, even if multiple members apply. Apply: Call to see if funds are available. Visit to complete an application, call to have 1 mailed, or apply online at [email or web address] NOTE: Households with a person age 23 and over or a person with a documented disability can apply beginning December 1. Other households can apply beginning January 1. Documents: Photo ID, birth certificate, proof of household income, copy of utility bill, latest bank statement, the names and Social Security numbers for everyone in the household, and proof of disability if under age 63.  [facility] [facility]  PROGRAM DEPARTMENT OF SOCIAL SERVICES - [name] [address] [place], [place], [place] [address] [phone] (Main) Mon 8:00am - 5:00pm; Tue 8:00am - 5:00pm; Wed 8:00am - 5:00pm; Thu 8:00am - 5:00pm; Fri 8:00am - 5:00pm; Website: [email or web address] Eligibility: Meet income limits and resource guidelines. Each  household is only eligible once, even if multiple members apply. Apply: Call to see if funds are available. Visit to complete an application, call to have 1 mailed, or apply online at [email or web address] NOTE: Households with a person age 48 and over or a person with a documented disability can apply beginning December 1. Other households can apply beginning January 1. Documents: Photo ID, birth certificate, proof of household income, copy of utility bill, latest bank statement, the names and Social Security numbers for everyone in the household, and proof of disability if under age 56.

## 2024-11-01 NOTE — Hospital Course (Signed)
 Patient is a 58 year old male with significant past medical history of hypertension, PVD, CVA, CKD stage III, hyperlipidemia, DM2, alcohol abuse, history of alcoholic pancreatitis, major depressive disorder, Suicidal Ideation (Admission for SI 5/14-5/19/25), ED visit for left foot pain (06/27/24- CTA showing stenosis/plaque in femoral arteries, decreased flow-started on plavix  and ASA, ABIs ordered , saw MD Lanis with vascular diagnosed with PAD, not vascular candidate 07/01/24) and GERD who initially presented to [facility] with shortness of breath leg swelling and chest tightness.  Patient reported that he was out of his antihypertensive medications for at least 3 weeks and had orthopnea and shortness of breath.  In the ED blood pressure was notable for systolic more than 200 with diastolic in the 140s.  Patient was given hydralazine  10 mg, Lasix  80 mg IV and was started on nitroglycerin  infusion for hypertensive emergency and CHF exacerbation.  Cardiology was also consulted.  Initial labs were notable for sodium of 143 with glucose of 116 and creatinine of 3.6.  ALT TEE of 16 and AST of 35 with albumin low at 3.1 and ALP at 70.  proBNP elevated at 6550.  Troponins were elevated at 197 followed by 181.  No leukocytosis was noted.  Chest x-ray showed pulmonary vascular congestion.  EKG showed sinus tachycardia with LVH.  Flu, COVID, RSV PCR was negative.  Review of previous 2D echocardiogram from 06/19/2023 showed a EF of 50 to 55%.  Patient was initially admitted to ICU for hypertensive emergency with AKI.  Of note patient has not been able to ambulate  and uses wheelchair since his stroke in 2016. Per patient he has been out of medications since this summer and has no PCP.   Hypertensive emergency Acute on chronic CHF exacerbation Elevated proBNP Elevated troponin- trending down  Dyspnea Review of 2D echocardiogram from  06/19/23-EF 50 to 55%, LV low normal function, LV demonstrates regional wall  abnormalities, grade 1 diastolic dysfunction, RV normal, RV systolic function normal, LA mild to moderate dilation, trivial mitral valve regurg.  Received IV Lasix .  Initially was on nitroglycerin  drip but has been transition to oral medications.  Patient is currently on hydralazine  p.o. twice daily, hydralazine  as needed, labetalol  300 mg twice daily, Entresto .  Patient is on  amlodipine  Catapres  hydralazine  labetalol  from home.  Continue aspirin , Lipitor , Plavix .   Hypertension, hyperlipidemia, CVA, peripheral vascular disease. Continue statins.  Not a vascular surgery candidate.  No pulses palpable.  Continue aspirin  Plavix .  Was seen by vascular surgery as outpatient 07/01/2024.  No longer ambulating.  AKI on CKD stage III Secondary to hypertensive emergency.  Baseline creatinine is between 1.9-2 over the last year continue to monitor.  Trend BMP.    DM 2 Continue sliding scale insulin .  Hemoglobin A1c of 5.8.   EtOH abuse/Tobacco use  History of alcoholic pancreatitis LFTs normal, continue CIWA protocol.  MDD Suicidal Attempt 04/21/24 No ongoing suicidal ideation.  Continue Lexapro    GERD Continue Protonix 

## 2024-11-01 NOTE — Progress Notes (Signed)
 PROGRESS NOTE  Taylor Obrien FMW:[id number] DOB: March 04, 1966 DOA: 10/30/2024 PCP: Trudy Ave, NP   LOS: 1 day   Brief narrative:  Patient is a 58 year old male with significant past medical history of hypertension, PVD, CVA, CKD stage III, hyperlipidemia, DM2, alcohol abuse, history of alcoholic pancreatitis, major depressive disorder, Suicidal Ideation (Admission for SI 5/14-5/19/25), ED visit for left foot pain (06/27/24- CTA showing stenosis/plaque in femoral arteries, decreased flow-started on plavix  and ASA, ABIs ordered , saw MD Lanis with vascular diagnosed with PAD, not vascular candidate 07/01/24) and GERD who initially presented to [facility] with shortness of breath leg swelling and chest tightness.  Patient reported that he was out of his antihypertensive medications for at least 3 weeks and had orthopnea and shortness of breath.  In the ED blood pressure was notable for systolic more than 200 with diastolic in the 140s.  Patient was given hydralazine  10 mg, Lasix  80 mg IV and was started on nitroglycerin  infusion for hypertensive emergency and CHF exacerbation.  Cardiology was also consulted.  Initial labs were notable for sodium of 143 with glucose of 116 and creatinine of 3.6.  ALT TEE of 16 and AST of 35 with albumin low at 3.1 and ALP at 70.  proBNP elevated at 6550.  Troponins were elevated at 197 followed by 181.  No leukocytosis was noted.  Chest x-ray showed pulmonary vascular congestion.  EKG showed sinus tachycardia with LVH.  Flu, COVID, RSV PCR was negative.  Review of previous 2D echocardiogram from 06/19/2023 showed a EF of 50 to 55%.  Patient was initially admitted to ICU for hypertensive emergency with AKI.  Of note patient has not been able to ambulate  and uses wheelchair since his stroke in 2016. Per patient he has been out of medications since this summer and has no PCP.   Assessment/Plan: Principal Problem:   Hypertensive emergency   Hypertensive  emergency Acute on chronic CHF exacerbation Elevated proBNP Elevated troponin Dyspnea Review of 2D echocardiogram from  06/19/23-EF 50 to 55%, LV low normal function, LV demonstrates regional wall abnormalities, grade 1 diastolic dysfunction, RV normal, RV systolic function normal, LA mild to moderate dilation, trivial mitral valve regurg.  Received IV Lasix .  Initially was on nitroglycerin  drip but has been transition to oral medications.  Patient is currently on hydralazine  p.o. twice daily, hydralazine  as needed, labetalol  300 mg twice daily, Entresto .  Patient is on  amlodipine  Catapres  hydralazine  labetalol  from home.  Continue aspirin , Lipitor , Plavix .  Follow cardiology recommendations.   Hypertension, hyperlipidemia, CVA, peripheral vascular disease. Continue statins.  Not a vascular surgery candidate.  No pulses palpable.  Continue aspirin  Plavix .  Was seen by vascular surgery as outpatient 07/01/2024.  No longer ambulating.  AKI on CKD stage III Secondary to hypertensive emergency.  Baseline creatinine is between 1.9-2 over the last year continue to monitor.  Trend BMP.    DM 2 Continue sliding scale insulin .  Hemoglobin A1c of 5.8.   EtOH abuse/Tobacco use  History of alcoholic pancreatitis LFTs normal, continue CIWA protocol.  MDD Suicidal Attempt 04/21/24 No ongoing suicidal ideation.  Continue Lexapro    GERD Continue Protonix    DVT prophylaxis: heparin  injection 5,000 Units Start: 10/31/24 0600 SCDs Start: 10/31/24 0241   Disposition: Uncertain at this time.  Will get PT evaluation  Status is: Inpatient Remains inpatient appropriate because: Pending clinical improvement,    Code Status:     Code Status: Full Code  Family Communication: None at bedside  Consultants: Cardiology  Procedures: None  Anti-infectives:  None  Anti-infectives (From admission, onward)    None        Subjective: Today, patient was seen and examined at bedside.  Patient  states that he feels a little better today.  Denies any nausea vomiting fever chills or rigor.  Was able to eat okay.  States that he has not been getting any help at home from his family.  States that his wheelchair is not working as well.  Objective: Vitals:   10/31/24 2100 11/01/24 0404  BP: (!) 160/95 123/86  Pulse: 100 88  Resp: 19 19  Temp: 98.3 F (36.8 C) 98.1 F (36.7 C)  SpO2: 94% 94%    Intake/Output Summary (Last 24 hours) at 11/01/2024 1117 Last data filed at 11/01/2024 0906 Gross per 24 hour  Intake 84.8 ml  Output 500 ml  Net -415.2 ml   Filed Weights   10/30/24 2114 11/01/24 0500  Weight: 99.8 kg 76.6 kg   Body mass index is 27.26 kg/m.   Physical Exam:  GENERAL: Patient is alert awake and oriented. Not in obvious distress. HENT: No scleral pallor or icterus. Pupils equally reactive to light. Oral mucosa is moist NECK: is supple, no gross swelling noted. CHEST: Clear to auscultation. No crackles or wheezes.   CVS: S1 and S2 heard, no murmur. Regular rate and rhythm.  ABDOMEN: Soft, non-tender, bowel sounds are present. EXTREMITIES: Bilateral lower extremity edema trace. CNS: Cranial nerves are intact.  Left lower extremity weakness. SKIN: warm and dry without rashes.  Data Review: I have personally reviewed the following laboratory data and studies,  CBC: Recent Labs  Lab 10/30/24 2210 10/31/24 0257 11/01/24 0440  WBC 7.2 10.3 7.7  NEUTROABS 5.3  --   --   HGB 14.1 12.6* 10.5*  HCT 43.3 38.0* 31.8*  MCV 97.7 96.9 96.1  PLT 249 232 183   Basic Metabolic Panel: Recent Labs  Lab 10/30/24 2210 10/31/24 0257 10/31/24 0258 11/01/24 0440  NA 143  --  143 138  K 4.4  --  3.8 4.3  CL 108  --  109 106  CO2 25  --  24 22  GLUCOSE 116*  --  120* 117*  BUN 24*  --  24* 33*  CREATININE 3.61* 3.85* 3.86* 4.88*  CALCIUM  8.7*  --  8.2* 8.1*  MG  --   --  1.7 2.3  PHOS  --   --  3.9  --    Liver Function Tests: Recent Labs  Lab 10/30/24 2210   AST 35  ALT 16  ALKPHOS 70  BILITOT 0.2  PROT 6.3*  ALBUMIN 3.1*   Recent Labs  Lab 10/31/24 0257  LIPASE 23   No results for input(s): AMMONIA in the last 168 hours. Cardiac Enzymes: No results for input(s): CKTOTAL, CKMB, CKMBINDEX, TROPONINI in the last 168 hours. BNP (last 3 results) No results for input(s): BNP in the last 8760 hours.  ProBNP (last 3 results) Recent Labs    10/30/24 2210  PROBNP 6,550.0*    CBG: Recent Labs  Lab 10/31/24 0223 10/31/24 0810 10/31/24 1120 10/31/24 1526 10/31/24 2209  GLUCAP 134* 119* 115* 139* 110*   Recent Results (from the past 240 hours)  Resp panel by RT-PCR (RSV, Flu A&B, Covid) Anterior Nasal Swab     Status: None   Collection Time: 10/30/24  9:36 PM   Specimen: Anterior Nasal Swab  Result Value Ref Range Status   SARS Coronavirus  2 by RT PCR NEGATIVE NEGATIVE Final    Comment: (NOTE) SARS-CoV-2 target nucleic acids are NOT DETECTED.  The SARS-CoV-2 RNA is generally detectable in upper respiratory specimens during the acute phase of infection. The lowest concentration of SARS-CoV-2 viral copies this assay can detect is 138 copies/mL. A negative result does not preclude SARS-Cov-2 infection and should not be used as the sole basis for treatment or other patient management decisions. A negative result may occur with  improper specimen collection/handling, submission of specimen other than nasopharyngeal swab, presence of viral mutation(s) within the areas targeted by this assay, and inadequate number of viral copies(<138 copies/mL). A negative result must be combined with clinical observations, patient history, and epidemiological information. The expected result is Negative.  Fact Sheet for Patients:  bloggercourse.com  Fact Sheet for Healthcare Providers:  seriousbroker.it  This test is no t yet approved or cleared by the [place]  FDA and  has  been authorized for detection and/or diagnosis of SARS-CoV-2 by FDA under an Emergency Use Authorization (EUA). This EUA will remain  in effect (meaning this test can be used) for the duration of the COVID-19 declaration under Section 564(b)(1) of the Act, 21 U.S.C.section 360bbb-3(b)(1), unless the authorization is terminated  or revoked sooner.       Influenza A by PCR NEGATIVE NEGATIVE Final   Influenza B by PCR NEGATIVE NEGATIVE Final    Comment: (NOTE) The Xpert Xpress SARS-CoV-2/FLU/RSV plus assay is intended as an aid in the diagnosis of influenza from Nasopharyngeal swab specimens and should not be used as a sole basis for treatment. Nasal washings and aspirates are unacceptable for Xpert Xpress SARS-CoV-2/FLU/RSV testing.  Fact Sheet for Patients: bloggercourse.com  Fact Sheet for Healthcare Providers: seriousbroker.it  This test is not yet approved or cleared by the [place]  FDA and has been authorized for detection and/or diagnosis of SARS-CoV-2 by FDA under an Emergency Use Authorization (EUA). This EUA will remain in effect (meaning this test can be used) for the duration of the COVID-19 declaration under Section 564(b)(1) of the Act, 21 U.S.C. section 360bbb-3(b)(1), unless the authorization is terminated or revoked.     Resp Syncytial Virus by PCR NEGATIVE NEGATIVE Final    Comment: (NOTE) Fact Sheet for Patients: bloggercourse.com  Fact Sheet for Healthcare Providers: seriousbroker.it  This test is not yet approved or cleared by the [place]  FDA and has been authorized for detection and/or diagnosis of SARS-CoV-2 by FDA under an Emergency Use Authorization (EUA). This EUA will remain in effect (meaning this test can be used) for the duration of the COVID-19 declaration under Section 564(b)(1) of the Act, 21 U.S.C. section 360bbb-3(b)(1), unless the  authorization is terminated or revoked.  Performed at [facility], [address] [place], [place] [address]   MRSA Next Gen by PCR, Nasal     Status: None   Collection Time: 10/31/24  2:41 AM   Specimen: Nasal Mucosa; Nasal Swab  Result Value Ref Range Status   MRSA by PCR Next Gen NOT DETECTED NOT DETECTED Final    Comment: (NOTE) The GeneXpert MRSA Assay (FDA approved for NASAL specimens only), is one component of a comprehensive MRSA colonization surveillance program. It is not intended to diagnose MRSA infection nor to guide or monitor treatment for MRSA infections. Test performance is not FDA approved in patients less than 79 years old. Performed at [facility], [address] [place], [place] [address]      Studies: US  RENAL Result  Date: 10/31/2024 EXAM: US  Retroperitoneum Complete, Renal. 10/31/2024 10:51:00 AM TECHNIQUE: Real-time ultrasonography of the retroperitoneum renal was performed. COMPARISON: None available CLINICAL HISTORY: 409830 AKI (acute kidney injury) 409830 AKI (acute kidney injury) FINDINGS: FINDINGS: RIGHT KIDNEY/URETER: The right kidney measures 10.2 x 5.8 x 4.3 cm with a volume of 131.9 cc. Increased parenchymal echogenicity. No hydronephrosis. No calculus. No mass. LEFT KIDNEY/URETER: The left kidney measures 10.6 x 6.0 x 4.1 cm with a volume of 137.6 cc. Increased parenchymal echogenicity. No hydronephrosis. No calculus. No mass. BLADDER: The bladder appears normal with visualization of both ureteral jets. IMPRESSION: 1. Increased bilateral renal parenchymal echogenicity, compatible with medical renal disease 2. No signs of hydronephrosis. Electronically signed by: Waddell Calk MD 10/31/2024 11:20 AM EST RP Workstation: [id number]   ECHOCARDIOGRAM COMPLETE Result Date: 10/31/2024    ECHOCARDIOGRAM REPORT   Patient Name:   TARAY NORMOYLE Date of Exam: 10/31/2024 Medical Rec #:  [id number]     Height:       66.0 in Accession #:    [id number]    Weight:        220.0 lb Date of Birth:  1966-04-10     BSA:          2.083 m Patient Age:    58 years      BP:           154/109 mmHg Patient Gender: M             HR:           104 bpm. Exam Location:  Inpatient Procedure: 2D Echo, Cardiac Doppler and Color Doppler (Both Spectral and Color            Flow Doppler were utilized during procedure). STAT ECHO Indications:    I50.31 Acute diastolic (congestive) heart failure  History:        Patient has prior history of Echocardiogram examinations, most                 recent 06/19/2023. Risk Factors:Hypertension, Dyslipidemia, ETOH,                 Diabetes and Sleep Apnea.  Sonographer:    Damien Senior RDCS Referring Phys: [id number] FONDA JAYSON SHARPS IMPRESSIONS  1. Left ventricular ejection fraction, by estimation, is 30%. Left ventricular ejection fraction by 2D MOD biplane is 29.1 %. The left ventricle has moderately decreased function. The left ventricle demonstrates regional wall motion abnormalities (see scoring diagram/findings for description). There is moderate asymmetric left ventricular hypertrophy of the septal segment. Indeterminate diastolic filling due to E-A fusion.  2. Right ventricular systolic function is normal. The right ventricular size is normal. Tricuspid regurgitation signal is inadequate for assessing PA pressure.  3. The mitral valve is degenerative. Mild mitral valve regurgitation. No evidence of mitral stenosis.  4. The aortic valve was not well visualized. Aortic valve regurgitation is not visualized. No aortic stenosis is present.  5. The inferior vena cava is normal in size with greater than 50% respiratory variability, suggesting right atrial pressure of 3 mmHg. FINDINGS  Left Ventricle: Left ventricular ejection fraction, by estimation, is 30%. Left ventricular ejection fraction by 2D MOD biplane is 29.1 %. The left ventricle has moderately decreased function. The left ventricle demonstrates regional wall motion abnormalities. The left ventricular  internal cavity size was normal in size. There is moderate asymmetric left ventricular hypertrophy of the septal segment. Indeterminate diastolic filling due to E-A fusion.  LV Wall Scoring: The mid  and distal lateral wall, posterior wall, and basal inferior segment are akinetic. The antero-lateral wall and mid inferior segment are hypokinetic. Right Ventricle: The right ventricular size is normal. Right vetricular wall thickness was not well visualized. Right ventricular systolic function is normal. Tricuspid regurgitation signal is inadequate for assessing PA pressure. Left Atrium: Left atrial size was normal in size. Right Atrium: Right atrial size was normal in size. Pericardium: There is no evidence of pericardial effusion. Mitral Valve: The mitral valve is degenerative in appearance. Mild mitral valve regurgitation. No evidence of mitral valve stenosis. Tricuspid Valve: The tricuspid valve is normal in structure. Tricuspid valve regurgitation is trivial. No evidence of tricuspid stenosis. Aortic Valve: The aortic valve was not well visualized. Aortic valve regurgitation is not visualized. No aortic stenosis is present. Pulmonic Valve: The pulmonic valve was not well visualized. Pulmonic valve regurgitation is not visualized. No evidence of pulmonic stenosis. Aorta: The aortic root is normal in size and structure and the ascending aorta was not well visualized. Venous: The inferior vena cava is normal in size with greater than 50% respiratory variability, suggesting right atrial pressure of 3 mmHg. IAS/Shunts: No atrial level shunt detected by color flow Doppler.  LEFT VENTRICLE PLAX 2D                        Biplane EF (MOD) LVIDd:         4.90 cm         LV Biplane EF:   Left LVIDs:         4.50 cm                          ventricular LV PW:         1.10 cm                          ejection LV IVS:        1.40 cm                          fraction by LVOT diam:     1.97 cm                          2D MOD LV  SV:         36                               biplane is LV SV Index:   17                               29.1 %. LVOT Area:     3.05 cm  LV Volumes (MOD) LV vol d, MOD    128.0 ml A2C: LV vol d, MOD    122.0 ml A4C: LV vol s, MOD    85.0 ml A2C: LV vol s, MOD    93.9 ml A4C: LV SV MOD A2C:   43.0 ml LV SV MOD A4C:   122.0 ml LV SV MOD BP:    37.3 ml RIGHT VENTRICLE RV S prime:     14.60 cm/s TAPSE (M-mode): 1.7 cm LEFT ATRIUM             Index  RIGHT ATRIUM           Index LA diam:        4.63 cm 2.22 cm/m   RA Area:     14.30 cm LA Vol (A2C):   53.4 ml 25.64 ml/m  RA Volume:   32.40 ml  15.56 ml/m LA Vol (A4C):   72.3 ml 34.72 ml/m LA Biplane Vol: 65.0 ml 31.21 ml/m  AORTIC VALVE LVOT Vmax:   101.00 cm/s LVOT Vmean:  66.400 cm/s LVOT VTI:    0.119 m  AORTA Ao Root diam: 3.14 cm  SHUNTS Systemic VTI:  0.12 m Systemic Diam: 1.97 cm Soyla Merck MD Electronically signed by Soyla Merck MD Signature Date/Time: 10/31/2024/9:05:11 AM    Final    DG Chest Port 1 View Result Date: 10/30/2024 EXAM: 1 VIEW(S) XRAY OF THE CHEST 10/30/2024 09:53:49 PM COMPARISON: Comparison study 01/29/2024. CLINICAL HISTORY: Peripheral edema and shortness of breath. FINDINGS: LUNGS AND PLEURA: Increased vascular congestion is noted with interstitial edema. No infiltrate is noted. No sizable effusion is seen. HEART AND MEDIASTINUM: Cardiac shadow is enlarged. Aortic calcifications are seen. BONES AND SOFT TISSUES: No bony abnormality is seen. IMPRESSION: 1. Increased vascular congestion with interstitial edema. No sizable effusion or infiltrate. Electronically signed by: Oneil Devonshire MD 10/30/2024 09:56 PM EST RP Workstation: [id number] Vernal Alstrom, MD  [name] 11/01/2024  If 7PM-7AM, please contact night-coverage

## 2024-11-02 ENCOUNTER — Ambulatory Visit: Admitting: Physician Assistant

## 2024-11-02 DIAGNOSIS — I161 Hypertensive emergency: Secondary | ICD-10-CM | POA: Diagnosis not present

## 2024-11-02 LAB — BASIC METABOLIC PANEL WITH GFR
Anion gap: 8 (ref 5–15)
BUN: 40 mg/dL — ABNORMAL HIGH (ref 6–20)
CO2: 24 mmol/L (ref 22–32)
Calcium: 7.9 mg/dL — ABNORMAL LOW (ref 8.9–10.3)
Chloride: 107 mmol/L (ref 98–111)
Creatinine, Ser: 5.24 mg/dL — ABNORMAL HIGH (ref 0.61–1.24)
GFR, Estimated: 12 mL/min — ABNORMAL LOW (ref >60)
Glucose, Bld: 147 mg/dL — ABNORMAL HIGH (ref 70–99)
Potassium: 3.6 mmol/L (ref 3.5–5.1)
Sodium: 139 mmol/L (ref 135–145)

## 2024-11-02 LAB — URINALYSIS, ROUTINE W REFLEX MICROSCOPIC
Bilirubin Urine: NEGATIVE
Glucose, UA: NEGATIVE mg/dL
Ketones, ur: NEGATIVE mg/dL
Nitrite: NEGATIVE
Protein, ur: >=300 mg/dL — AB
Specific Gravity, Urine: 1.011 (ref 1.005–1.030)
WBC, UA: >50 WBC/hpf (ref 0–5)
pH: 5 (ref 5.0–8.0)

## 2024-11-02 LAB — PROTEIN / CREATININE RATIO, URINE
Creatinine, Urine: 104 mg/dL
Protein Creatinine Ratio: 3.32 mg/mg{creat} — ABNORMAL HIGH (ref 0.00–0.15)
Total Protein, Urine: 345 mg/dL

## 2024-11-02 LAB — CBC
HCT: 32 % — ABNORMAL LOW (ref 39.0–52.0)
Hemoglobin: 10.6 g/dL — ABNORMAL LOW (ref 13.0–17.0)
MCH: 31.9 pg (ref 26.0–34.0)
MCHC: 33.1 g/dL (ref 30.0–36.0)
MCV: 96.4 fL (ref 80.0–100.0)
Platelets: 186 K/uL (ref 150–400)
RBC: 3.32 MIL/uL — ABNORMAL LOW (ref 4.22–5.81)
RDW: 14.4 % (ref 11.5–15.5)
WBC: 6.6 K/uL (ref 4.0–10.5)
nRBC: 0 % (ref 0.0–0.2)

## 2024-11-02 LAB — GLUCOSE, CAPILLARY
Glucose-Capillary: 113 mg/dL — ABNORMAL HIGH (ref 70–99)
Glucose-Capillary: 132 mg/dL — ABNORMAL HIGH (ref 70–99)
Glucose-Capillary: 156 mg/dL — ABNORMAL HIGH (ref 70–99)
Glucose-Capillary: 112 mg/dL — ABNORMAL HIGH (ref 70–99)

## 2024-11-02 LAB — IRON AND TIBC
Iron: 37 ug/dL — ABNORMAL LOW (ref 45–182)
Saturation Ratios: 23 % (ref 17.9–39.5)
TIBC: 162 ug/dL — ABNORMAL LOW (ref 250–450)
UIBC: 125 ug/dL

## 2024-11-02 LAB — MAGNESIUM: Magnesium: 2.1 mg/dL (ref 1.7–2.4)

## 2024-11-02 LAB — VITAMIN D 25 HYDROXY (VIT D DEFICIENCY, FRACTURES): Vit D, 25-Hydroxy: 17.63 ng/mL — ABNORMAL LOW (ref 30–100)

## 2024-11-02 MED ORDER — ISOSORB DINITRATE-HYDRALAZINE 20-37.5 MG PO TABS
1.0000 | ORAL_TABLET | Freq: Three times a day (TID) | ORAL | Status: DC
  Administered 2024-11-02 – 2024-11-05 (×10): 1 via ORAL
  Filled 2024-11-02 (×12): qty 1

## 2024-11-02 MED ORDER — ACETAMINOPHEN 325 MG PO TABS
650.0000 mg | ORAL_TABLET | Freq: Four times a day (QID) | ORAL | Status: DC | PRN
  Administered 2024-11-02 – 2024-11-03 (×2): 650 mg via ORAL
  Filled 2024-11-02 (×2): qty 2

## 2024-11-02 MED ORDER — DOCUSATE SODIUM 100 MG PO CAPS
100.0000 mg | ORAL_CAPSULE | Freq: Two times a day (BID) | ORAL | Status: DC
  Administered 2024-11-02 – 2024-11-03 (×3): 100 mg via ORAL
  Filled 2024-11-02 (×3): qty 1

## 2024-11-02 MED ORDER — POLYETHYLENE GLYCOL 3350 17 G PO PACK
17.0000 g | PACK | Freq: Every day | ORAL | Status: DC
  Administered 2024-11-02 – 2024-11-05 (×4): 17 g via ORAL
  Filled 2024-11-02 (×5): qty 1

## 2024-11-02 NOTE — Progress Notes (Signed)
 RE: Taylor Obrien Date of Birth: 1966/06/21 Date: 11/02/2024   To Whom It May Concern:  Please be advised that the above-named patient will require a short-term nursing home stay - anticipated 30 days or less for rehabilitation and strengthening.  The plan is for return home.                 MD signature

## 2024-11-02 NOTE — Evaluation (Signed)
 Occupational Therapy Evaluation Patient Details Name: Taylor Obrien MRN: [id number] DOB: Feb 18, 1966 Today's Date: 11/02/2024   History of Present Illness   Pt is a 58 y/o M admitted on 10/30/24 after presenting with c/o SOB, BLE swelling & chest tightness. Pt is being treated for hypertensive emergency, acute on chronic CHF exacerbation. PMH: HTN, PVD, CVA, CKD 3, HLD, DM2, alcohol abuse, alcoholic pancreatitis, MDD, suicidal ideation (admission for SI 04/21/24-04/26/24), GERD     Clinical Impressions Pt is from home where he lives with the support of 2 of his 7 children. He is mod I at a wheelchair level since 2016 when he had a CVA that affected his L side. Today he presents with deficits in strength, balance, coordination, activity tolerance. He did well utilizing the [facility] for multiple sit<>stands during full body bath and seated grooming EOB. Pt hard to understand but very pleasant and motivated to improve and rehab back to his baseline. OT will follow acutely and recommend post-acute rehab of <3 hours daily    If plan is discharge home, recommend the following:   A lot of help with walking and/or transfers;A lot of help with bathing/dressing/bathroom;Assistance with cooking/housework;Direct supervision/assist for medications management     Functional Status Assessment   Patient has had a recent decline in their functional status and demonstrates the ability to make significant improvements in function in a reasonable and predictable amount of time.     Equipment Recommendations   None recommended by OT (Pt has appropriate DME)     Recommendations for Other Services   PT consult;Speech consult (cognition)     Precautions/Restrictions   Precautions Precautions: Fall Restrictions Weight Bearing Restrictions Per Provider Order: No     Mobility Bed Mobility Overal bed mobility: Needs Assistance Bed Mobility: Supine to Sit, Sit to Supine     Supine to sit: Used  rails, HOB elevated, Mod assist (exit L side of bed) Sit to supine: Contact guard assist   General bed mobility comments: mod A to elevate trunk, Pt demonstrating good use of RLE to hook and bring [name] to EOB    Transfers Overall transfer level: Needs assistance Equipment used: Ambulation equipment used Transfers: Sit to/from Stand Sit to Stand: Min assist           General transfer comment: min A in Stedy x 10 Transfer via Lift Equipment: Stedy    Balance Overall balance assessment: Needs assistance Sitting-balance support: Feet supported Sitting balance-Leahy Scale: Fair Sitting balance - Comments: Pt able to sit EOB with knees in stedy without any assist, perform a number of ADL tasks without LOB                                   ADL either performed or assessed with clinical judgement   ADL Overall ADL's : Needs assistance/impaired Eating/Feeding: Set up   Grooming: Wash/dry face;Oral care;Set up;Sitting Grooming Details (indicate cue type and reason): EOB with stedy in front of him Upper Body Bathing: Moderate assistance Upper Body Bathing Details (indicate cue type and reason): Pt does front, OT did back Lower Body Bathing: Moderate assistance Lower Body Bathing Details (indicate cue type and reason): assist for rear and knees down Upper Body Dressing : Minimal assistance Upper Body Dressing Details (indicate cue type and reason): to preserve IV site Lower Body Dressing: Maximal assistance   Toilet Transfer: Minimal assistance (with stedy) Toilet Transfer Details (indicate cue type  and reason): use of lift equipment - stedy Toileting- Clothing Manipulation and Hygiene: Maximal assistance;Sit to/from stand Toileting - Clothing Manipulation Details (indicate cue type and reason): Pt able to maintain standing for OT to perform peri care     Functional mobility during ADLs:  (Pt min A with use of lift equipment - Stedy) General ADL Comments: decreased  safety awareness, strength, activity tolerance     Vision Patient Visual Report: No change from baseline       Perception         Praxis         Pertinent Vitals/Pain Pain Assessment Pain Assessment: No/denies pain Pain Intervention(s): Monitored during session, Repositioned     Extremity/Trunk Assessment Upper Extremity Assessment Upper Extremity Assessment: Generalized weakness;Right hand dominant (L sided weakness from CVA in 2016)   Lower Extremity Assessment Lower Extremity Assessment: Defer to PT evaluation       Communication Communication Communication: Impaired Factors Affecting Communication: Reduced clarity of speech (reports he had his teeth pulled in the past)   Cognition Arousal: Alert Behavior During Therapy: Impulsive Cognition: Cognition impaired     Awareness: Intellectual awareness intact, Online awareness impaired Memory impairment (select all impairments): Working memory Attention impairment (select first level of impairment): Selective attention Executive functioning impairment (select all impairments): Problem solving, Reasoning OT - Cognition Comments: hard to follow single train of thought, Pt admitting that he feels like his cog is slipping                 Following commands: Impaired Following commands impaired: Follows multi-step commands inconsistently, Follows multi-step commands with increased time     Cueing  General Comments   Cueing Techniques: Verbal cues  Pt likes [employer]le[employer] Rap and 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, has 7 children   Exercises     Shoulder Instructions      Home Living Family/patient expects to be discharged to:: Private residence Living Arrangements: Children Available Help at Discharge: Available PRN/intermittently Type of Home: Apartment Home Access: Ramped entrance     Home Layout: One level     Bathroom Shower/Tub: Sponge bathes at baseline     Bathroom Accessibility: Yes   Home Equipment:  Wheelchair - manual   Additional Comments: likes to rap, concern about children having the time and ability to care for him like he needs      Prior Functioning/Environment Prior Level of Function : Needs assist             Mobility Comments: Pt reports he's non-ambulatory, transfers to/from w/c without assistance, goes to the nearest store in his w/c. ADLs Comments: Sink bathes without assistance    OT Problem List: Decreased strength;Decreased activity tolerance;Impaired balance (sitting and/or standing);Decreased knowledge of use of DME or AE   OT Treatment/Interventions: Self-care/ADL training;Energy conservation;DME and/or AE instruction;Therapeutic activities;Patient/family education;Balance training      OT Goals(Current goals can be found in the care plan section)   Acute Rehab OT Goals Patient Stated Goal: get stronger again, take better care of himself OT Goal Formulation: With patient Time For Goal Achievement: 11/16/24 Potential to Achieve Goals: Good ADL Goals Pt Will Perform Grooming: with modified independence;sitting Pt Will Perform Upper Body Dressing: with set-up;sitting Pt Will Perform Lower Body Dressing: with set-up;sitting/lateral leans;sit to/from stand Pt Will Transfer to Toilet: with supervision;squat pivot transfer Pt Will Perform Toileting - Clothing Manipulation and hygiene: with set-up;sitting/lateral leans   OT Frequency:  Min 2X/week    Co-evaluation  AM-PAC OT 6 Clicks Daily Activity     Outcome Measure Help from another person eating meals?: None Help from another person taking care of personal grooming?: A Little Help from another person toileting, which includes using toliet, bedpan, or urinal?: A Lot Help from another person bathing (including washing, rinsing, drying)?: A Lot Help from another person to put on and taking off regular upper body clothing?: A Little Help from another person to put on and taking off  regular lower body clothing?: A Lot 6 Click Score: 16   End of Session Equipment Utilized During Treatment: Gait belt Nurse Communication: Mobility status;Need for lift equipment (stedy)  Activity Tolerance: Patient tolerated treatment well Patient left: in bed;with call bell/phone within reach;with bed alarm set  OT Visit Diagnosis: Unsteadiness on feet (R26.81);Muscle weakness (generalized) (M62.81)                Time: 8965-8896 OT Time Calculation (min): 29 min Charges:  OT General Charges $OT Visit: 1 Visit OT Evaluation $OT Eval Moderate Complexity: 1 Mod OT Treatments $Self Care/Home Management : 8-22 mins  Leita DEL OTR/L Acute Rehabilitation Services Office: [phone]  Leita PARAS [facility] 11/02/2024, 1:23 PM

## 2024-11-02 NOTE — Progress Notes (Addendum)
 PROGRESS NOTE  Taylor Obrien FMW:[id number] DOB: 1966/05/25 DOA: 10/30/2024 PCP: Trudy Ave, NP   LOS: 2 days   Brief narrative:  Patient is a 58 year old male with significant past medical history of hypertension, PVD, CVA, CKD stage III, hyperlipidemia, DM2, alcohol abuse, history of alcoholic pancreatitis, major depressive disorder, Suicidal Ideation (Admission for SI 5/14-5/19/25), ED visit for left foot pain (06/27/24- CTA showing stenosis/plaque in femoral arteries, decreased flow-started on plavix  and ASA, ABIs ordered , saw MD Lanis with vascular diagnosed with PAD, not vascular candidate 07/01/24) and GERD who initially presented to [facility] with shortness of breath leg swelling and chest tightness.  Patient reported that he was out of his antihypertensive medications for at least 3 weeks and had orthopnea and shortness of breath.  In the ED blood pressure was notable for systolic more than 200 with diastolic in the 140s.  Patient was given hydralazine  10 mg, Lasix  80 mg IV and was started on nitroglycerin  infusion for hypertensive emergency and CHF exacerbation.  Cardiology was also consulted.  Initial labs were notable for sodium of 143 with glucose of 116 and creatinine of 3.6.  ALT TEE of 16 and AST of 35 with albumin low at 3.1 and ALP at 70.  proBNP elevated at 6550.  Troponins were elevated at 197 followed by 181.  No leukocytosis was noted.  Chest x-ray showed pulmonary vascular congestion.  EKG showed sinus tachycardia with LVH.  Flu, COVID, RSV PCR was negative.  Review of previous 2D echocardiogram from 06/19/2023 showed a EF of 50 to 55%.  Patient was initially admitted to ICU for hypertensive emergency with AKI.  Of note patient has not been able to ambulate  and uses wheelchair since his stroke in 2016. Per patient he has been out of medications since this summer and has no PCP.   Assessment/Plan: Principal Problem:   Hypertensive emergency Active Problems:   AKI  (acute kidney injury)   Acute combined systolic and diastolic CHF, NYHA class 3 (HCC)   Hypertensive emergency Acute on chronic CHF exacerbation Elevated proBNP Elevated troponin Dyspnea Review of 2D echocardiogram from  06/19/23-EF 50 to 55%, LV low normal function, LV demonstrates regional wall abnormalities, grade 1 diastolic dysfunction, RV normal, RV systolic function normal, LA mild to moderate dilation, trivial mitral valve regurg.  Initially received IV Lasix  and was on nitroglycerin  drip but has been transitioned to oral medications.  Patient is currently on hydralazine  p.o. twice daily, hydralazine  as needed, labetalol  300 mg twice daily, Entresto .  Patient is on  amlodipine , Catapres ,, Coreg , hydralazine .  Continue aspirin , Lipitor , Plavix .  Follow cardiology recommendations.  Blood pressure is overall improved   Hypertension, hyperlipidemia, CVA, peripheral vascular disease. Continue statins.  Not a vascular surgery candidate.  No pulses palpable.  Continue aspirin  Plavix .  Was seen by vascular surgery as outpatient 07/01/2024.  No longer ambulating.  AKI on CKD stage IIIb Secondary to hypertensive emergency.  Baseline creatinine is between 1.9-2.0 over the last year continue to monitor.  BMP Today with creatinine of 5.2 and uptrending from initial presentation.  Entresto  has been discontinued.  Spoke with nephrology Dr. Tobie for consultation.  DM 2 Continue sliding scale insulin .  Hemoglobin A1c of 5.8.  POC glucose of 113   EtOH abuse/Tobacco use  History of alcoholic pancreatitis LFTs normal, continue CIWA protocol.  No signs of active withdrawal  MDD Suicidal Attempt 04/21/24 No ongoing suicidal ideation.  Continue Lexapro    GERD Continue Protonix    Deconditioning  debility.  Seen by physical therapy and recommend skilled nursing facility placement.  DVT prophylaxis: heparin  injection 5,000 Units Start: 10/31/24 0600 SCDs Start: 10/31/24 0241   Disposition: TOC on  board.  PT has recommended skilled nursing facility placement   Status is: Inpatient Remains inpatient appropriate because: Pending clinical improvement, worsening renal function, nephrology evaluation need for rehabilitation    Code Status:     Code Status: Full Code  Family Communication: None at bedside  Consultants: Cardiology Nephrology  Procedures: None  Anti-infectives:  None  Anti-infectives (From admission, onward)    None        Subjective: Today, patient was seen and examined at bedside.  Patient complains of mild 0 movements of the left upper and lower extremities.  Denies any nausea vomiting fever chills or rigor.  Has not had a bowel movement in 3 days.  Denies any shortness of breath dyspnea chest pain  Objective: Vitals:   11/02/24 0427 11/02/24 0729  BP: 129/82 139/87  Pulse: 77 73  Resp: 18 18  Temp: 98.4 F (36.9 C) 98 F (36.7 C)  SpO2: 98% 98%    Intake/Output Summary (Last 24 hours) at 11/02/2024 1107 Last data filed at 11/02/2024 9177 Gross per 24 hour  Intake --  Output 865 ml  Net -865 ml   Filed Weights   10/30/24 2114 11/01/24 0500  Weight: 99.8 kg 76.6 kg   Body mass index is 27.26 kg/m.   Physical Exam:  GENERAL: Patient is alert awake and oriented. Not in obvious distress. HENT: No scleral pallor or icterus. Pupils equally reactive to light. Oral mucosa is moist NECK: is supple, no gross swelling noted. CHEST: Clear to auscultation. No crackles or wheezes.   CVS: S1 and S2 heard, no murmur. Regular rate and rhythm.  ABDOMEN: Soft, non-tender, bowel sounds are present. EXTREMITIES: Bilateral lower extremity edema trace. CNS: Alert awake and oriented.  Communicative.  Left sided weakness from previous stroke. SKIN: warm and dry without rashes.  Data Review: I have personally reviewed the following laboratory data and studies,  CBC: Recent Labs  Lab 10/30/24 2210 10/31/24 0257 11/01/24 0440 11/02/24 0413  WBC  7.2 10.3 7.7 6.6  NEUTROABS 5.3  --   --   --   HGB 14.1 12.6* 10.5* 10.6*  HCT 43.3 38.0* 31.8* 32.0*  MCV 97.7 96.9 96.1 96.4  PLT 249 232 183 186   Basic Metabolic Panel: Recent Labs  Lab 10/30/24 2210 10/31/24 0257 10/31/24 0258 11/01/24 0440 11/02/24 0413  NA 143  --  143 138 139  K 4.4  --  3.8 4.3 3.6  CL 108  --  109 106 107  CO2 25  --  24 22 24   GLUCOSE 116*  --  120* 117* 147*  BUN 24*  --  24* 33* 40*  CREATININE 3.61* 3.85* 3.86* 4.88* 5.24*  CALCIUM  8.7*  --  8.2* 8.1* 7.9*  MG  --   --  1.7 2.3 2.1  PHOS  --   --  3.9  --   --    Liver Function Tests: Recent Labs  Lab 10/30/24 2210  AST 35  ALT 16  ALKPHOS 70  BILITOT 0.2  PROT 6.3*  ALBUMIN 3.1*   Recent Labs  Lab 10/31/24 0257  LIPASE 23   No results for input(s): AMMONIA in the last 168 hours. Cardiac Enzymes: No results for input(s): CKTOTAL, CKMB, CKMBINDEX, TROPONINI in the last 168 hours. BNP (last 3 results) No results  for input(s): BNP in the last 8760 hours.  ProBNP (last 3 results) Recent Labs    10/30/24 2210  PROBNP 6,550.0*    CBG: Recent Labs  Lab 10/31/24 2209 11/01/24 1128 11/01/24 1758 11/01/24 2119 11/02/24 0733  GLUCAP 110* 189* 180* 107* 113*   Recent Results (from the past 240 hours)  Resp panel by RT-PCR (RSV, Flu A&B, Covid) Anterior Nasal Swab     Status: None   Collection Time: 10/30/24  9:36 PM   Specimen: Anterior Nasal Swab  Result Value Ref Range Status   SARS Coronavirus 2 by RT PCR NEGATIVE NEGATIVE Final    Comment: (NOTE) SARS-CoV-2 target nucleic acids are NOT DETECTED.  The SARS-CoV-2 RNA is generally detectable in upper respiratory specimens during the acute phase of infection. The lowest concentration of SARS-CoV-2 viral copies this assay can detect is 138 copies/mL. A negative result does not preclude SARS-Cov-2 infection and should not be used as the sole basis for treatment or other patient management decisions. A  negative result may occur with  improper specimen collection/handling, submission of specimen other than nasopharyngeal swab, presence of viral mutation(s) within the areas targeted by this assay, and inadequate number of viral copies(<138 copies/mL). A negative result must be combined with clinical observations, patient history, and epidemiological information. The expected result is Negative.  Fact Sheet for Patients:  bloggercourse.com  Fact Sheet for Healthcare Providers:  seriousbroker.it  This test is no t yet approved or cleared by the [place]  FDA and  has been authorized for detection and/or diagnosis of SARS-CoV-2 by FDA under an Emergency Use Authorization (EUA). This EUA will remain  in effect (meaning this test can be used) for the duration of the COVID-19 declaration under Section 564(b)(1) of the Act, 21 U.S.C.section 360bbb-3(b)(1), unless the authorization is terminated  or revoked sooner.       Influenza A by PCR NEGATIVE NEGATIVE Final   Influenza B by PCR NEGATIVE NEGATIVE Final    Comment: (NOTE) The Xpert Xpress SARS-CoV-2/FLU/RSV plus assay is intended as an aid in the diagnosis of influenza from Nasopharyngeal swab specimens and should not be used as a sole basis for treatment. Nasal washings and aspirates are unacceptable for Xpert Xpress SARS-CoV-2/FLU/RSV testing.  Fact Sheet for Patients: bloggercourse.com  Fact Sheet for Healthcare Providers: seriousbroker.it  This test is not yet approved or cleared by the [place]  FDA and has been authorized for detection and/or diagnosis of SARS-CoV-2 by FDA under an Emergency Use Authorization (EUA). This EUA will remain in effect (meaning this test can be used) for the duration of the COVID-19 declaration under Section 564(b)(1) of the Act, 21 U.S.C. section 360bbb-3(b)(1), unless the authorization  is terminated or revoked.     Resp Syncytial Virus by PCR NEGATIVE NEGATIVE Final    Comment: (NOTE) Fact Sheet for Patients: bloggercourse.com  Fact Sheet for Healthcare Providers: seriousbroker.it  This test is not yet approved or cleared by the [place]  FDA and has been authorized for detection and/or diagnosis of SARS-CoV-2 by FDA under an Emergency Use Authorization (EUA). This EUA will remain in effect (meaning this test can be used) for the duration of the COVID-19 declaration under Section 564(b)(1) of the Act, 21 U.S.C. section 360bbb-3(b)(1), unless the authorization is terminated or revoked.  Performed at [facility], [address], [place], [place] [address]   MRSA Next Gen by PCR, Nasal     Status: None   Collection Time: 10/31/24  2:41 AM   Specimen:  Nasal Mucosa; Nasal Swab  Result Value Ref Range Status   MRSA by PCR Next Gen NOT DETECTED NOT DETECTED Final    Comment: (NOTE) The GeneXpert MRSA Assay (FDA approved for NASAL specimens only), is one component of a comprehensive MRSA colonization surveillance program. It is not intended to diagnose MRSA infection nor to guide or monitor treatment for MRSA infections. Test performance is not FDA approved in patients less than 79 years old. Performed at [facility], [address] [place], [place] [address]      Studies: No results found.     Jasen Hartstein, MD  [name] 11/02/2024  If 7PM-7AM, please contact night-coverage

## 2024-11-02 NOTE — Consult Note (Signed)
 Reason for Consult: Acute kidney injury on chronic kidney disease stage III Referring Physician: Vernal Alstrom MD [facility])  HPI:  58 year old man with past medical history significant for hypertension, peripheral vascular disease, history of CVA, dyslipidemia, major depressive disorder with prior suicidal ideation, history of alcohol abuse and chronic kidney disease stage IIIb (baseline creatinine around 1.9).  Admitted to the hospital with chest tightness, shortness of breath and worsening leg swelling after not having taken his antihypertensive medications for at least 6 weeks due to financial reasons.  He was admitted to the ICU with hypertensive emergency (acute exacerbation of congestive heart failure and acute kidney injury).  Creatinine on presentation was 3.6 and has risen now to 5.2 in the setting of blood pressure control including single dose of furosemide  and Entresto .  Renal ultrasound showed increased bilateral parenchymal echogenicity consistent with CKD without any hydronephrosis or calculus.  He informs me that he does not have a primary care doctor and does not know who was prescribing his medications.  He lives in [place] and requires a wheelchair for ambulation.  Past Medical History:  Diagnosis Date   Acute alcoholic pancreatitis    Arthritis    Chronic pain    Depression    Diabetes mellitus without complication (HCC)    Essential hypertension, benign    GERD (gastroesophageal reflux disease)    Headache    HTN (hypertension) 11/27/2015   Hyperlipidemia    Peripheral vascular disease    Sleep apnea    could not tolerate CPAP   Stroke (HCC)    2016    Past Surgical History:  Procedure Laterality Date   BIOPSY  08/19/2022   Procedure: BIOPSY;  Surgeon: Cindie Carlin POUR, DO;  [facility];  Service: Endoscopy;;   CLOSED REDUCTION MANDIBULAR FRACTURE W/ ARCH BARS     + multiple extractions   COLONOSCOPY WITH PROPOFOL  N/A 08/19/2022   Procedure:  COLONOSCOPY WITH PROPOFOL ;  Surgeon: Cindie Carlin POUR, DO;  [facility];  Service: Endoscopy;  Laterality: N/A;  9:15am   Condyloma resection     ESOPHAGOGASTRODUODENOSCOPY (EGD) WITH PROPOFOL  N/A 08/19/2022   Procedure: ESOPHAGOGASTRODUODENOSCOPY (EGD) WITH PROPOFOL ;  Surgeon: Cindie Carlin POUR, DO;  [facility];  Service: Endoscopy;  Laterality: N/A;   MULTIPLE EXTRACTIONS WITH ALVEOLOPLASTY Bilateral 01/23/2018   Procedure: DENTAL EXTRACTION OF TEETH NUMBER ONE, TWO, THREE, FOUR, FIVE, SIX, SEVEN, EIGHT, NINE, TEN, ELEVEN, TWELVE, THIRTEEN, FOURTEEN, FIFTEEN, SIXTEEN, SEVENTEEN, TWENTY, TWENTY-ONE, TWENTY-TWO, TWENTY-THREE, TWENTY-FOUR, TWENTY-FIVE, TWENTY-SIX, TWENTY-SEVEN, TWENTY-EIGHT, TWENTY-NINE, THIRTY-TWO WITH ALVEOLOPLASTY;  Surgeon: Sheryle Hamilton, DDS;  Location: [facility] OR;  Service: Oral Surgery;  Lat   POLYPECTOMY  08/19/2022   Procedure: POLYPECTOMY;  Surgeon: Cindie Carlin POUR, DO;  [facility];  Service: Endoscopy;;   RADIOLOGY WITH ANESTHESIA N/A 11/18/2015   Procedure: RADIOLOGY WITH ANESTHESIA;  Surgeon: Thyra Nash, MD;  Location: [facility] OR;  Service: Radiology;  Laterality: N/A;   Removal foreign body right shoulder     Right rotator cuff repair     TEE WITHOUT CARDIOVERSION N/A 11/21/2015   Procedure: TRANSESOPHAGEAL ECHOCARDIOGRAM (TEE);  Surgeon: Ezra GORMAN Shuck, MD;  Location: [facility] ENDOSCOPY;  Service: Cardiovascular;  Laterality: N/A;   TOOTH EXTRACTION N/A 01/24/2018   Procedure: SUTURE ORAL WOUND;  Surgeon: Sheryle Hamilton, DDS;  Location: [facility] OR;  Service: Oral Surgery;  Laterality: N/A;    Family History  Problem Relation Age of Onset   Diabetes Mother    Hypertension Mother    Drug abuse  Mother    Hyperlipidemia Mother    Diabetes Father    Hypertension Father    Hyperlipidemia Father    Diabetes Brother    Hypertension Brother     Social History:  reports that he has been smoking cigarettes. He has a 15 pack-year smoking  history. He has never used smokeless tobacco. He reports that he does not currently use alcohol. He reports current drug use. Drug: Marijuana.  Allergies:  Allergies  Allergen Reactions   Ibuprofen  Other (See Comments)    Avoid per PCP   Oxcarbazepine Other (See Comments)    Patient goes out of right state of mind.     Medications: Scheduled:  amLODipine   10 mg Oral Daily   aspirin   81 mg Oral Daily   atorvastatin   80 mg Oral Daily   carvedilol   12.5 mg Oral BID WC   Chlorhexidine  Gluconate Cloth  6 each Topical Daily   clopidogrel   75 mg Oral Daily   docusate sodium   100 mg Oral BID   escitalopram   10 mg Oral Daily   gabapentin   300 mg Oral QHS   heparin   5,000 Units Subcutaneous Q8H   insulin  aspart  0-15 Units Subcutaneous TID WC   insulin  aspart  0-5 Units Subcutaneous QHS   isosorbide -hydrALAZINE   1 tablet Oral TID   multivitamin with minerals  1 tablet Oral Daily   pantoprazole   40 mg Oral Daily   polyethylene glycol  17 g Oral Daily   Continuous:     Latest Ref Rng & Units 11/02/2024    4:13 AM 11/01/2024    4:40 AM 10/31/2024    2:58 AM  BMP  Glucose 70 - 99 mg/dL 852  882  879   BUN 6 - 20 mg/dL 40  33  24   Creatinine 0.61 - 1.24 mg/dL 4.75  5.11  6.13   Sodium 135 - 145 mmol/L 139  138  143   Potassium 3.5 - 5.1 mmol/L 3.6  4.3  3.8   Chloride 98 - 111 mmol/L 107  106  109   CO2 22 - 32 mmol/L 24  22  24    Calcium  8.9 - 10.3 mg/dL 7.9  8.1  8.2       Latest Ref Rng & Units 11/02/2024    4:13 AM 11/01/2024    4:40 AM 10/31/2024    2:57 AM  CBC  WBC 4.0 - 10.5 K/uL 6.6  7.7  10.3   Hemoglobin 13.0 - 17.0 g/dL 89.3  89.4  87.3   Hematocrit 39.0 - 52.0 % 32.0  31.8  38.0   Platelets 150 - 400 K/uL 186  183  232      No results found.  Review of Systems  Constitutional:  Negative for appetite change, chills and fever.  HENT:  Negative for facial swelling, nosebleeds and trouble swallowing.   Eyes:  Negative for redness and visual disturbance.   Respiratory:  Positive for chest tightness and shortness of breath. Negative for cough.   Cardiovascular:  Positive for leg swelling. Negative for chest pain.  Gastrointestinal:  Negative for abdominal pain, blood in stool, diarrhea, nausea and vomiting.  Genitourinary:  Negative for dysuria, frequency and hematuria.  Musculoskeletal:  Positive for gait problem.  Neurological:  Positive for weakness.   Blood pressure 139/87, pulse 73, temperature 98 F (36.7 C), temperature source Oral, resp. rate 18, height 5' 6 (1.676 m), weight 76.6 kg, SpO2 98%. Physical Exam Vitals and nursing note reviewed.  Constitutional:      General: He is not in acute distress.    Appearance: Normal appearance. He is normal weight. He is not ill-appearing.  HENT:     Head: Normocephalic and atraumatic.     Right Ear: External ear normal.     Left Ear: External ear normal.     Nose: Nose normal.     Mouth/Throat:     Mouth: Mucous membranes are dry.     Pharynx: Oropharynx is clear.  Eyes:     Extraocular Movements: Extraocular movements intact.     Conjunctiva/sclera: Conjunctivae normal.  Cardiovascular:     Rate and Rhythm: Normal rate and regular rhythm.     Pulses: Normal pulses.  Pulmonary:     Effort: Pulmonary effort is normal.     Breath sounds: Normal breath sounds. No wheezing or rales.  Abdominal:     General: Abdomen is flat. Bowel sounds are normal.     Palpations: Abdomen is soft.     Tenderness: There is no abdominal tenderness.  Musculoskeletal:     Cervical back: Normal range of motion and neck supple.     Right lower leg: No edema.     Left lower leg: No edema.  Skin:    General: Skin is warm and dry.  Neurological:     Mental Status: He is alert and oriented to person, place, and time.  Psychiatric:     Comments: Limited insight/reasoning     Assessment/Plan: 1.  Acute kidney injury on chronic kidney disease stage IIIb: Underlying chronic kidney disease is largely from  hypertensive nephrosclerosis although cannot entirely rule out the impact of his type 2 diabetes.  I will check a urinalysis and quantify proteinuria today.  Acute injury appears consistent with hemodynamic renal injury in the setting of hypertensive emergency and subsequent blood pressure control.  He is euvolemic on exam and does not require diuretics.  I fear that his insight and medical follow-up are significantly limited and this will pose a barrier to future renal follow-up.  He resides in [place] and my plan is to set him up for continued nephrology follow-up there upon discharge. Avoid nephrotoxic medications including NSAIDs and iodinated intravenous contrast exposure unless the latter is absolutely indicated.  Preferred narcotic agents for pain control are hydromorphone , fentanyl , and methadone. Morphine  should not be used. Avoid Baclofen and avoid oral sodium phosphate  and magnesium  citrate based laxatives / bowel preps. Continue strict Input and Output monitoring. Will monitor the patient closely with you and intervene or adjust therapy as indicated by changes in clinical status/labs.  2.  Hypertensive emergency: This has been controlled/improved with resumption of oral antihypertensive therapy and improving blood pressures.  Avoid RAAS blockers at this time, no indication for diuretics. 3.  Anemia: Likely secondary to chronic illness including chronic kidney disease, will check iron studies today. 4.  Hypocalcemia: Corrected calcium  is about 8.6 and raises concern for secondary hyperparathyroidism.  He has normal phosphorus level and I will check a 25-hydroxy vitamin D  level and PTH level.  Gordy MARLA Blanch 11/02/2024, 12:04 PM

## 2024-11-02 NOTE — Progress Notes (Signed)
   11/02/24 1410  Spiritual Encounters  Type of Visit Initial  Care provided to: Patient  Conversation partners present during encounter Nurse;Physician  Reason for visit Routine spiritual support  OnCall Visit No  Interventions  Spiritual Care Interventions Made Established relationship of care and support;Compassionate presence;Reflective listening;Normalization of emotions;Narrative/life review;Explored values/beliefs/practices/strengths;Prayer;Provided grief education;Supported grief process  Intervention Outcomes  Outcomes Connection to spiritual care;Awareness around self/spiritual resourses;Reduced anxiety;Reduced isolation    Chaplain responded to spiritual consult - requests for prayer and advance directive paperwork. Documents and information provided.  Pt Taylor Obrien shared a number of significant relational losses he has experienced over the last year. He expressed a steadfast trust in God and his Christian world view. He mentioned two previous attempts to take his own life (one decades ago, and one within the last year), however he stressed that he does not feel any urge to harm himself or anyone else at this time.  Chaplain provided spiritual/emotional support in all the ways listed above, and we continue to remain available as needed.

## 2024-11-02 NOTE — NC FL2 (Signed)
 [place]  MEDICAID FL2 LEVEL OF CARE FORM     IDENTIFICATION  Patient Name: Taylor Obrien Birthdate: November 14, 1966 Sex: male Admission Date (Current Location): 10/30/2024  [facility] and [place] Number:  Producer, Television/film/video and Address:  [facility] [place], [address], [place], [place] [address]      Provider Number: [id number]  Attending Physician Name and Address:  Sonjia Held, MD  Relative Name and Phone Number:  Durenda Rush (Brother)  [phone]    Current Level of Care: Hospital Recommended Level of Care: Skilled Nursing Facility Prior Approval Number:    Date Approved/Denied:   PASRR Number: Pending  Discharge Plan: SNF    Current Diagnoses: Patient Active Problem List   Diagnosis Date Noted   Acute combined systolic and diastolic CHF, NYHA class 3 (HCC) 11/01/2024   Hypertensive emergency 10/31/2024   Suicidal ideation 04/21/2024   MDD (major depressive disorder), recurrent severe, 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   Intentional diphenhydramine  overdose (HCC) 04/19/2024   Pancreatic duct dilated 01/31/2024   Hypertensive urgency 01/30/2024   Chronic kidney disease, stage 3b (HCC) 01/30/2024   Type 2 diabetes mellitus with hyperglycemia (HCC) 01/30/2024   History of right ACA stroke 01/30/2024   Anxiety 01/30/2024   Fall at home, initial encounter 06/18/2023   AKI (acute kidney injury) 06/18/2023   Acute pancreatitis 07/19/2022   GERD (gastroesophageal reflux disease)    Iron deficiency anemia    Constipation    Abdominal pain 06/14/2022   Nausea and vomiting 06/14/2022   Elevated lipase 06/14/2022   Hypoalbuminemia due to protein-calorie malnutrition 06/14/2022   Stage 3a chronic kidney disease (CKD) (HCC) 06/04/2022   Anemia    Acute pancreatitis without infection or necrosis    Transaminasemia 04/29/2022   Class 1 obesity 04/29/2022   Thrombocytosis 04/26/2022   Acute alcoholic pancreatitis 04/26/2022   Diabetes mellitus  (HCC) 04/24/2022   Post-operative state 01/24/2018   Spastic hemiplegia affecting nondominant side (HCC) 06/03/2016   Dysuria    OSA (obstructive sleep apnea)    Hypokalemia    Elevated blood pressure    Hemiparesis affecting left side as late effect of stroke (HCC)    Epistaxis    HTN (hypertension) 11/27/2015   Acute renal failure superimposed on stage 3a chronic kidney disease (HCC) 11/27/2015   Hemiplegia and hemiparesis following unspecified cerebrovascular disease affecting left non-dominant side (HCC) 11/23/2015   Gait disturbance, post-stroke 11/23/2015   Cerebrovascular accident (CVA) due to occlusion of right anterior cerebral artery (HCC)    Essential hypertension    Depression    Chronic pain syndrome    ETOH abuse    Marijuana abuse    Cerebrovascular accident (CVA) due to thrombosis of right carotid artery (HCC)    Malignant hypertension    Mixed hyperlipidemia    Cerebral infarction (HCC) 11/18/2015   Stroke (cerebrum) (HCC) 11/18/2015   Chest pain 10/09/2012   Chronic back pain 10/09/2012   Tobacco abuse 10/09/2012   Hypertensive heart disease 08/31/2012   Accelerated hypertension 08/31/2012   Precordial pain 08/31/2012    Orientation RESPIRATION BLADDER Height & Weight     Self, Time, Situation, Place  Normal Continent Weight: 168 lb 14 oz (76.6 kg) Height:  5' 6 (167.6 cm)  BEHAVIORAL SYMPTOMS/MOOD NEUROLOGICAL BOWEL NUTRITION STATUS       (no data recorded) Diet (see dc summary)  AMBULATORY STATUS COMMUNICATION OF NEEDS Skin   Total Care (Pt has to use a WC) Verbally Normal  Personal Care Assistance Level of Assistance  Bathing, Feeding, Dressing Bathing Assistance: Maximum assistance (Moderate assistance per PT note) Feeding assistance: Independent Dressing Assistance: Maximum assistance     Functional Limitations Info  Sight, Hearing, Speech Sight Info: Adequate Hearing Info: Adequate Speech Info: Adequate     SPECIAL CARE FACTORS FREQUENCY  PT (By licensed PT), OT (By licensed OT)     PT Frequency: 5x/wk OT Frequency: 5x/wk            Contractures Contractures Info: Not present    Additional Factors Info  Code Status, Allergies Code Status Info: FULL Allergies Info: Ibuprofen   Oxcarbazepine           Current Medications (11/02/2024):  This is the current hospital active medication list Current Facility-Administered Medications  Medication Dose Route Frequency Provider Last Rate Last Admin   acetaminophen  (TYLENOL ) tablet 650 mg  650 mg Oral Q6H PRN Pokhrel, Laxman, MD   650 mg at 11/02/24 0954   amLODipine  (NORVASC ) tablet 10 mg  10 mg Oral Daily Anner Alm ORN, MD   10 mg at 11/02/24 9046   aspirin  chewable tablet 81 mg  81 mg Oral Daily Smith, Joshua C, NP   81 mg at 11/02/24 9046   atorvastatin  (LIPITOR ) tablet 80 mg  80 mg Oral Daily Smith, Joshua C, NP   80 mg at 11/02/24 9045   carvedilol  (COREG ) tablet 12.5 mg  12.5 mg Oral BID WC Haley, Sheng L, PA-C   12.5 mg at 11/02/24 9046   Chlorhexidine  Gluconate Cloth 2 % PADS 6 each  6 each Topical Daily Smith, Joshua C, NP   6 each at 11/02/24 9045   clopidogrel  (PLAVIX ) tablet 75 mg  75 mg Oral Daily Smith, Joshua C, NP   75 mg at 11/02/24 9046   cyclobenzaprine  (FLEXERIL ) tablet 5 mg  5 mg Oral TID PRN Pokhrel, Laxman, MD   5 mg at 11/01/24 1726   docusate sodium  (COLACE) capsule 100 mg  100 mg Oral BID Pokhrel, Laxman, MD   100 mg at 11/02/24 1219   escitalopram  (LEXAPRO ) tablet 10 mg  10 mg Oral Daily Smith, Joshua C, NP   10 mg at 11/02/24 9045   gabapentin  (NEURONTIN ) capsule 300 mg  300 mg Oral QHS Smith, Joshua C, NP   300 mg at 11/01/24 2122   heparin  injection 5,000 Units  5,000 Units Subcutaneous Q8H Smith, Joshua C, NP   5,000 Units at 11/02/24 9444   hydrALAZINE  (APRESOLINE ) injection 10 mg  10 mg Intravenous Q4H PRN Stretch, Robert J, MD       insulin  aspart (novoLOG ) injection 0-15 Units  0-15 Units  Subcutaneous TID WC Smith, Joshua C, NP   2 Units at 11/02/24 1219   insulin  aspart (novoLOG ) injection 0-5 Units  0-5 Units Subcutaneous QHS Smith, Joshua C, NP       isosorbide -hydrALAZINE  (BIDIL ) 20-37.5 MG per tablet 1 tablet  1 tablet Oral TID Henry Manuelita NOVAK, NP       multivitamin with minerals tablet 1 tablet  1 tablet Oral Daily Smith, Joshua C, NP   1 tablet at 11/02/24 9046   ondansetron  (ZOFRAN ) injection 4 mg  4 mg Intravenous Q6H PRN Smith, Joshua C, NP   4 mg at 10/31/24 1346   Oral care mouth rinse  15 mL Mouth Rinse PRN Layman Raisin, DO       oxyCODONE  (Oxy IR/ROXICODONE ) immediate release tablet 5 mg  5 mg Oral Q6H PRN Pokhrel, Laxman, MD  5 mg at 11/01/24 2119   pantoprazole  (PROTONIX ) EC tablet 40 mg  40 mg Oral Daily Smith, Joshua C, NP   40 mg at 11/02/24 0954   polyethylene glycol (MIRALAX  / GLYCOLAX ) packet 17 g  17 g Oral Daily Pokhrel, Laxman, MD   17 g at 11/02/24 1219     Discharge Medications: Please see discharge summary for a list of discharge medications.  Relevant Imaging Results:  Relevant Lab Results:   Additional Information SSN: [id number]  [name], LCSWA

## 2024-11-02 NOTE — TOC Progression Note (Addendum)
 Transition of Care [facility]) - Progression Note    Patient Details  Name: Taylor Obrien MRN: [id number] Date of Birth: Feb 14, 1966  [facility] CM/SW Contact  [name], [place] Phone Number: 11/02/2024, 12:39 PM  Clinical Narrative:  CSW spoke to the pt at bedside about PT recs. Pt stated that he was agreeable and willing to go to [facility] and had a preference to stay in near his zip code area. CSW gave medicare.gov list to the pt.   D/t the pt previously stating he has a hard time comprehending written information, CSW offered to go through the list with him and pt was agreeable. CSW asked if the pt had any questions and they shrugged their shoulders and gave short responses. CSW expressed concern and the pt stated that they were told about how their kidneys are doing and they he felt sad about this. CSW expressed comfort and asked if they would like spiritual support. The pt was agreeable and consult was placed.  CSW has sent out referrals in the HUB and bed offers & PASRR are pending.   CSW will continue to monitor.    Expected Discharge Plan: Home/Self Care Barriers to Discharge: Continued Medical Work up, Family Issues               Expected Discharge Plan and Services In-house Referral: Clinical Social Work     Living arrangements for the past 2 months: Single Family Home                                       Social Drivers of Health (SDOH) Interventions SDOH Screenings   Food Insecurity: Food Insecurity Present (10/31/2024)  Housing: Low Risk  (10/31/2024)  Transportation Needs: No Transportation Needs (10/31/2024)  Utilities: Not At Risk (10/31/2024)  Alcohol Screen: High Risk (04/21/2024)  Tobacco Use: High Risk (10/30/2024)    Readmission Risk Interventions    04/26/2022   11:38 AM 04/25/2022    9:38 AM  Readmission Risk Prevention Plan  Transportation Screening Complete Complete  Home Care Screening  Complete  Medication Review (RN  CM)  Complete  HRI or Home Care Consult Complete   Social Work Consult for Recovery Care Planning/Counseling Complete   Palliative Care Screening Not Applicable   Medication Review Oceanographer) Complete

## 2024-11-02 NOTE — Progress Notes (Signed)
    Brief cardiology follow-up: Blood pressure seem to be much better controlled on current regimen.  He is not having any active heart failure symptoms, lying flat.  Seems euvolemic.  At this point we are simply awaiting improvement in renal function.  If consolidated BP medications carvedilol  and BiDil  along with amlodipine .  Would not be overly aggressive treating blood pressure to avoid hypotension which would further exacerbate his renal function.  No requirement for diuretic at this point.   No plans for invasive evaluation based on his lack of mobility and also creatinine now 5.3.  Will continue to monitor and be available for assistance.     [name], MD

## 2024-11-03 DIAGNOSIS — I161 Hypertensive emergency: Secondary | ICD-10-CM | POA: Diagnosis not present

## 2024-11-03 LAB — PARATHYROID HORMONE, INTACT (NO CA): PTH: 261 pg/mL — ABNORMAL HIGH (ref 15–65)

## 2024-11-03 LAB — RENAL FUNCTION PANEL
Albumin: 1.9 g/dL — ABNORMAL LOW (ref 3.5–5.0)
Anion gap: 11 (ref 5–15)
BUN: 45 mg/dL — ABNORMAL HIGH (ref 6–20)
CO2: 22 mmol/L (ref 22–32)
Calcium: 7.8 mg/dL — ABNORMAL LOW (ref 8.9–10.3)
Chloride: 106 mmol/L (ref 98–111)
Creatinine, Ser: 5.1 mg/dL — ABNORMAL HIGH (ref 0.61–1.24)
GFR, Estimated: 13 mL/min — ABNORMAL LOW (ref >60)
Glucose, Bld: 105 mg/dL — ABNORMAL HIGH (ref 70–99)
Phosphorus: 3.2 mg/dL (ref 2.5–4.6)
Potassium: 3.9 mmol/L (ref 3.5–5.1)
Sodium: 139 mmol/L (ref 135–145)

## 2024-11-03 LAB — LIPID PANEL
Cholesterol: 287 mg/dL — ABNORMAL HIGH (ref 0–200)
HDL: 38 mg/dL — ABNORMAL LOW (ref >40)
LDL Cholesterol: 206 mg/dL — ABNORMAL HIGH (ref 0–99)
Total CHOL/HDL Ratio: 7.6 ratio
Triglycerides: 213 mg/dL — ABNORMAL HIGH (ref <150)
VLDL: 43 mg/dL — ABNORMAL HIGH (ref 0–40)

## 2024-11-03 LAB — GLUCOSE, CAPILLARY
Glucose-Capillary: 106 mg/dL — ABNORMAL HIGH (ref 70–99)
Glucose-Capillary: 147 mg/dL — ABNORMAL HIGH (ref 70–99)
Glucose-Capillary: 122 mg/dL — ABNORMAL HIGH (ref 70–99)
Glucose-Capillary: 106 mg/dL — ABNORMAL HIGH (ref 70–99)

## 2024-11-03 MED ORDER — CARVEDILOL 6.25 MG PO TABS
18.7500 mg | ORAL_TABLET | Freq: Two times a day (BID) | ORAL | Status: DC
  Administered 2024-11-03 – 2024-11-04 (×2): 18.75 mg via ORAL
  Filled 2024-11-03 (×3): qty 1

## 2024-11-03 MED ORDER — BISACODYL 10 MG RE SUPP
10.0000 mg | Freq: Once | RECTAL | Status: DC
Start: 2024-11-03 — End: 2024-11-06
  Filled 2024-11-03: qty 1

## 2024-11-03 MED ORDER — SENNOSIDES-DOCUSATE SODIUM 8.6-50 MG PO TABS
1.0000 | ORAL_TABLET | Freq: Two times a day (BID) | ORAL | Status: DC
  Administered 2024-11-03 – 2024-11-05 (×5): 1 via ORAL
  Filled 2024-11-03 (×7): qty 1

## 2024-11-03 MED ORDER — VITAMIN D (ERGOCALCIFEROL) 1.25 MG (50000 UNIT) PO CAPS
50000.0000 [IU] | ORAL_CAPSULE | ORAL | Status: DC
  Filled 2024-11-03: qty 1

## 2024-11-03 NOTE — Progress Notes (Signed)
 PROGRESS NOTE  Taylor Obrien FMW:[id number] DOB: 04/21/1966 DOA: 10/30/2024 PCP: Trudy Ave, NP   LOS: 3 days   Brief narrative:  Patient is a 58 year old male with significant past medical history of hypertension, PVD, CVA, CKD stage III, hyperlipidemia, DM2, alcohol abuse, history of alcoholic pancreatitis, major depressive disorder, Suicidal Ideation, PAD and GERD who initially presented to [facility] with shortness of breath, leg swelling and chest tightness.  Patient reported that he was out of his antihypertensive medications for at least 3 weeks and had orthopnea and shortness of breath.  In the ED, systolic blood pressure was more than 200 with diastolic in the 140s.  Patient was given hydralazine  10 mg, Lasix  80 mg IV and was started on nitroglycerin  infusion for hypertensive emergency and CHF exacerbation.  Cardiology was also consulted.  Initial labs were notable for sodium of 143 with glucose of 116 and creatinine of 3.6.  ALT  of 16 and AST of 35 with albumin low at 3.1 and ALP at 70.  proBNP elevated at 6550.  Troponins were elevated at 197 followed by 181.  No leukocytosis was noted.  Chest x-ray showed pulmonary vascular congestion.  EKG showed sinus tachycardia with LVH.  Flu, COVID, RSV PCR was negative.  Review of previous 2D echocardiogram from 06/19/2023 showed a EF of 50 to 55%.  Patient was initially admitted to ICU for hypertensive emergency with AKI..  Subsequently was transferred out of ICU.  Assessment/Plan: Principal Problem:   Hypertensive emergency Active Problems:   AKI (acute kidney injury)   Acute combined systolic and diastolic CHF, NYHA class 3 (HCC)   Hypertensive emergency Acute on chronic CHF exacerbation Elevated proBNP Elevated troponin Dyspnea Review of 2D echocardiogram from  06/19/23-EF 50 to 55%, LV low normal function, LV demonstrates regional wall abnormalities, grade 1 diastolic dysfunction, RV normal, RV systolic function normal, LA  mild to moderate dilation, trivial mitral valve regurg.  Initially received IV Lasix  and was on nitroglycerin  drip but has been transitioned to oral medications.  Patient is currently on hydralazine  p.o. twice daily, hydralazine  as needed, labetalol  300 mg twice daily,  Patient is on  amlodipine , Catapres ,, Coreg , hydralazine  at home..  Continue aspirin , Lipitor , Plavix .  Follow cardiology recommendations.  Blood pressure still elevated.   Hypertension, hyperlipidemia, CVA, peripheral vascular disease. Continue statins.  Not a vascular surgery candidate.  No pulses palpable.  Continue aspirin , Plavix .  Was seen by vascular surgery as outpatient 07/01/2024.  No longer ambulating and no plans for aggressive intervention..  AKI on CKD stage IIIb Secondary to hypertensive emergency.  Baseline creatinine is between 1.9-2.0 over the last year.  Creatinine today at 5.1.  Entresto  has been discontinued.  Nephrology following.  Plan for monitoring at this 24 hours to see renal recovery.  DM 2 Continue sliding scale insulin .  Hemoglobin A1c of 5.8.  POC glucose of 147   EtOH abuse/Tobacco use  History of alcoholic pancreatitis LFTs normal, continue CIWA protocol.  No signs of active withdrawal.  Tolerating oral well  MDD Suicidal Attempt 04/21/24 No ongoing suicidal ideation.  Continue Lexapro  from home   GERD Continue Protonix    Constipation.  On MiraLAX  and docusate.  Will change to Senokot twice daily continue MiraLAX  and give 1 dose of Dulcolax  Deconditioning debility.  Seen by physical therapy and recommend skilled nursing facility placement.  DVT prophylaxis: heparin  injection 5,000 Units Start: 10/31/24 0600 SCDs Start: 10/31/24 0241   Disposition: TOC on board.  PT has recommended  skilled nursing facility placement   Status is: Inpatient Remains inpatient appropriate because: Pending clinical improvement, awaiting improvement in renal function and need for rehabitation    Code Status:      Code Status: Full Code  Family Communication: None at bedside  Consultants: Cardiology Nephrology  Procedures: None  Anti-infectives:  None  Anti-infectives (From admission, onward)    None        Subjective: Today, patient was seen and examined at bedside.  Patient states that he has not had a bowel movement in few days.  Denies any other symptoms including shortness of breath dyspnea chest pain nausea or vomiting   Objective: Vitals:   11/03/24 0601 11/03/24 0724  BP: (!) 161/100 (!) 137/108  Pulse: 79 79  Resp:  18  Temp:  97.7 F (36.5 C)  SpO2:  93%    Intake/Output Summary (Last 24 hours) at 11/03/2024 1159 Last data filed at 11/03/2024 0834 Gross per 24 hour  Intake 420 ml  Output 1500 ml  Net -1080 ml   Filed Weights   10/30/24 2114 11/01/24 0500  Weight: 99.8 kg 76.6 kg   Body mass index is 27.26 kg/m.   Physical Exam:  General:  Average built, not in obvious distress, alert awake and oriented, Communicative HENT:   No scleral pallor or icterus noted. Oral mucosa is moist.  Chest:  Clear breath sounds. No crackles or wheezes.  CVS: S1 &S2 heard. No murmur.  Regular rate and rhythm. Abdomen: Soft, nontender, nondistended.  Bowel sounds are heard.   Extremities: No cyanosis, clubbing with bilateral lower extremity trace edema peripheral pulses are palpable. Psych: Alert, awake and oriented, normal mood CNS:  No cranial nerve deficits.  Mild left-sided weakness from previous stroke. Skin: Warm and dry.  No rashes noted.   Data Review: I have personally reviewed the following laboratory data and studies,  CBC: Recent Labs  Lab 10/30/24 2210 10/31/24 0257 11/01/24 0440 11/02/24 0413  WBC 7.2 10.3 7.7 6.6  NEUTROABS 5.3  --   --   --   HGB 14.1 12.6* 10.5* 10.6*  HCT 43.3 38.0* 31.8* 32.0*  MCV 97.7 96.9 96.1 96.4  PLT 249 232 183 186   Basic Metabolic Panel: Recent Labs  Lab 10/30/24 2210 10/31/24 0257 10/31/24 0258  11/01/24 0440 11/02/24 0413 11/03/24 0247  NA 143  --  143 138 139 139  K 4.4  --  3.8 4.3 3.6 3.9  CL 108  --  109 106 107 106  CO2 25  --  24 22 24 22   GLUCOSE 116*  --  120* 117* 147* 105*  BUN 24*  --  24* 33* 40* 45*  CREATININE 3.61* 3.85* 3.86* 4.88* 5.24* 5.10*  CALCIUM  8.7*  --  8.2* 8.1* 7.9* 7.8*  MG  --   --  1.7 2.3 2.1  --   PHOS  --   --  3.9  --   --  3.2   Liver Function Tests: Recent Labs  Lab 10/30/24 2210 11/03/24 0247  AST 35  --   ALT 16  --   ALKPHOS 70  --   BILITOT 0.2  --   PROT 6.3*  --   ALBUMIN 3.1* 1.9*   Recent Labs  Lab 10/31/24 0257  LIPASE 23   No results for input(s): AMMONIA in the last 168 hours. Cardiac Enzymes: No results for input(s): CKTOTAL, CKMB, CKMBINDEX, TROPONINI in the last 168 hours. BNP (last 3 results) No results for  input(s): BNP in the last 8760 hours.  ProBNP (last 3 results) Recent Labs    10/30/24 2210  PROBNP 6,550.0*    CBG: Recent Labs  Lab 11/02/24 1147 11/02/24 1613 11/02/24 1950 11/03/24 0727 11/03/24 1146  GLUCAP 132* 156* 112* 106* 147*   Recent Results (from the past 240 hours)  Resp panel by RT-PCR (RSV, Flu A&B, Covid) Anterior Nasal Swab     Status: None   Collection Time: 10/30/24  9:36 PM   Specimen: Anterior Nasal Swab  Result Value Ref Range Status   SARS Coronavirus 2 by RT PCR NEGATIVE NEGATIVE Final    Comment: (NOTE) SARS-CoV-2 target nucleic acids are NOT DETECTED.  The SARS-CoV-2 RNA is generally detectable in upper respiratory specimens during the acute phase of infection. The lowest concentration of SARS-CoV-2 viral copies this assay can detect is 138 copies/mL. A negative result does not preclude SARS-Cov-2 infection and should not be used as the sole basis for treatment or other patient management decisions. A negative result may occur with  improper specimen collection/handling, submission of specimen other than nasopharyngeal swab, presence of viral  mutation(s) within the areas targeted by this assay, and inadequate number of viral copies(<138 copies/mL). A negative result must be combined with clinical observations, patient history, and epidemiological information. The expected result is Negative.  Fact Sheet for Patients:  bloggercourse.com  Fact Sheet for Healthcare Providers:  seriousbroker.it  This test is no t yet approved or cleared by the [place]  FDA and  has been authorized for detection and/or diagnosis of SARS-CoV-2 by FDA under an Emergency Use Authorization (EUA). This EUA will remain  in effect (meaning this test can be used) for the duration of the COVID-19 declaration under Section 564(b)(1) of the Act, 21 U.S.C.section 360bbb-3(b)(1), unless the authorization is terminated  or revoked sooner.       Influenza A by PCR NEGATIVE NEGATIVE Final   Influenza B by PCR NEGATIVE NEGATIVE Final    Comment: (NOTE) The Xpert Xpress SARS-CoV-2/FLU/RSV plus assay is intended as an aid in the diagnosis of influenza from Nasopharyngeal swab specimens and should not be used as a sole basis for treatment. Nasal washings and aspirates are unacceptable for Xpert Xpress SARS-CoV-2/FLU/RSV testing.  Fact Sheet for Patients: bloggercourse.com  Fact Sheet for Healthcare Providers: seriousbroker.it  This test is not yet approved or cleared by the [place]  FDA and has been authorized for detection and/or diagnosis of SARS-CoV-2 by FDA under an Emergency Use Authorization (EUA). This EUA will remain in effect (meaning this test can be used) for the duration of the COVID-19 declaration under Section 564(b)(1) of the Act, 21 U.S.C. section 360bbb-3(b)(1), unless the authorization is terminated or revoked.     Resp Syncytial Virus by PCR NEGATIVE NEGATIVE Final    Comment: (NOTE) Fact Sheet for  Patients: bloggercourse.com  Fact Sheet for Healthcare Providers: seriousbroker.it  This test is not yet approved or cleared by the [place]  FDA and has been authorized for detection and/or diagnosis of SARS-CoV-2 by FDA under an Emergency Use Authorization (EUA). This EUA will remain in effect (meaning this test can be used) for the duration of the COVID-19 declaration under Section 564(b)(1) of the Act, 21 U.S.C. section 360bbb-3(b)(1), unless the authorization is terminated or revoked.  Performed at [facility], [address], [place], [place] [address]   MRSA Next Gen by PCR, Nasal     Status: None   Collection Time: 10/31/24  2:41 AM   Specimen: Nasal  Mucosa; Nasal Swab  Result Value Ref Range Status   MRSA by PCR Next Gen NOT DETECTED NOT DETECTED Final    Comment: (NOTE) The GeneXpert MRSA Assay (FDA approved for NASAL specimens only), is one component of a comprehensive MRSA colonization surveillance program. It is not intended to diagnose MRSA infection nor to guide or monitor treatment for MRSA infections. Test performance is not FDA approved in patients less than 77 years old. Performed at [facility], [address] [place], [place] [address]      Studies: No results found.     Vernal Alstrom, MD  [name] 11/03/2024  If 7PM-7AM, please contact night-coverage

## 2024-11-03 NOTE — Progress Notes (Signed)
 Heart Failure Navigator Progress Note  Assessed for Heart & Vascular TOC clinic readiness.  Patient does not meet criteria due to per MD notes patient mainly wheel chair bound, creatinine 5.3. Has a Hospital follow up appointment scheduled for 11/26/2024. No HF TOC.    Navigator will sign off at this time.   Stephane Haddock, BSN, Scientist, Clinical (histocompatibility And Immunogenetics) Only

## 2024-11-03 NOTE — Progress Notes (Signed)
 Physical Therapy Treatment Patient Details Name: Taylor Obrien MRN: [id number] DOB: 1966/10/15 Today's Date: 11/03/2024   History of Present Illness Pt is a 58 y/o M admitted 10/30/24 with c/o SOB, BLE swelling, chest tightness. Workup for hypertensive emergency, acute on chronic CHF exacerbation. PMH: HTN, PVD, CVA (residual L-side weakness), CKD 3, HLD, DM2, alcohol abuse, alcoholic pancreatitis, MDD, suicidal ideation (admission for SI 04/21/24-04/26/24), GERD.   PT Comments  Pt progressing with mobility, requiring min-modA for transfer to wheelchair, self-propelling w/c at supervision-level. Pt remains limited by generalized weakness, poor balance strategies/postural reactions and impaired cognition. Continue to recommend post-acute rehab (<3 hrs/day) to maximize functional mobility and independence prior to return home.     If plan is discharge home, recommend the following: A little help with walking and/or transfers;A little help with bathing/dressing/bathroom;Assistance with cooking/housework;Assistance with feeding;Assist for transportation;Help with stairs or ramp for entrance   Can travel by private vehicle     Yes  Equipment Recommendations  None recommended by PT    Recommendations for Other Services       Precautions / Restrictions Precautions Precautions: Fall;Other (comment) Recall of Precautions/Restrictions: Impaired Precaution/Restrictions Comments: h/o CVA with residual L-side weakness Restrictions Weight Bearing Restrictions Per Provider Order: No     Mobility  Bed Mobility Overal bed mobility: Needs Assistance Bed Mobility: Supine to Sit     Supine to sit: Modified independent (Device/Increase time), Used rails, HOB elevated Sit to supine: Min assist   General bed mobility comments: using RLE to hook [name] to assist out of bed, minA for LE management return to supine. pt able to scoot self up in bed when bed positioned so he can reach top bedrail     Transfers Overall transfer level: Needs assistance Equipment used:  (wheelchair & bed rail to pull on) Transfers: Sit to/from Stand, Bed to chair/wheelchair/BSC Sit to Stand: Min assist Stand pivot transfers: Min assist, Mod assist         General transfer comment: pt pulling manual w/c up to EOB, locking it and standing by pulling on it and bed rail, 180' stand pivot transfer with modA to prevent posterior LOB and stabilize w/c. pt transferring w/c>bed, only locking one brake requiring cues and assist to lock second brake, pulling with BUEs on bed rail to perform stand pivot ~90' with minA for stability    Ambulation/Gait                   Psychologist, Counselling mobility: Yes Wheelchair propulsion: Both upper extremities, Right lower extremity Wheelchair parts: Supervision/cueing Distance: 500+ ft Wheelchair Assistance Details (indicate cue type and reason): pt able to self-propel with BUEs while using RLE to keep L foot from dragging (does not have leg rests); also able to push w/c backwards with RLE and BUE; switches between both techniques frequently, navigates around room and objects well   Tilt Bed    Modified Rankin (Stroke Patients Only)       Balance Overall balance assessment: Needs assistance Sitting-balance support: Feet supported Sitting balance-Leahy Scale: Fair     Standing balance support: Bilateral upper extremity supported, During functional activity Standing balance-Leahy Scale: Poor                              Communication Communication Communication: Impaired Factors Affecting Communication: Reduced clarity of speech  Cognition Arousal:  Alert Behavior During Therapy: WFL for tasks assessed/performed   PT - Cognitive impairments: Problem solving, Safety/Judgement, No family/caregiver present to determine baseline, Memory, Attention                       PT -  Cognition Comments: pt easily distracted in conversation, switching subjects rapidly requiring redirection to current convo/task. does not seem to recall conversation with SW/CM yesterday where he was agreeable to SNF placement for STR Following commands: Impaired Following commands impaired: Follows multi-step commands inconsistently, Follows multi-step commands with increased time    Cueing Cueing Techniques: Verbal cues  Exercises      General Comments        Pertinent Vitals/Pain Pain Assessment Pain Assessment: Faces Faces Pain Scale: Hurts a little bit Pain Location: back, butt Pain Descriptors / Indicators: Discomfort Pain Intervention(s): Monitored during session, Repositioned    Home Living                          Prior Function            PT Goals (current goals can now be found in the care plan section) Progress towards PT goals: Progressing toward goals    Frequency    Min 2X/week      PT Plan      Co-evaluation              AM-PAC PT 6 Clicks Mobility   Outcome Measure  Help needed turning from your back to your side while in a flat bed without using bedrails?: A Little Help needed moving from lying on your back to sitting on the side of a flat bed without using bedrails?: A Little Help needed moving to and from a bed to a chair (including a wheelchair)?: A Lot Help needed standing up from a chair using your arms (e.g., wheelchair or bedside chair)?: A Lot Help needed to walk in hospital room?: Total Help needed climbing 3-5 steps with a railing? : Total 6 Click Score: 12    End of Session   Activity Tolerance: Patient tolerated treatment well Patient left: in bed;with call bell/phone within reach;with bed alarm set;with nursing/sitter in room Nurse Communication: Mobility status PT Visit Diagnosis: Muscle weakness (generalized) (M62.81);Other abnormalities of gait and mobility (R26.89);Difficulty in walking, not elsewhere  classified (R26.2);Unsteadiness on feet (R26.81)     Time: 8760-8696 PT Time Calculation (min) (ACUTE ONLY): 24 min  Charges:    $Therapeutic Activity: 8-22 mins $Wheel Chair Management: 8-22 mins PT General Charges $$ ACUTE PT VISIT: 1 Visit                     Darice Almas, PT, DPT Acute Rehabilitation Services  Personal: Secure Chat Rehab Office: [phone]  Darice LITTIE Almas 11/03/2024, 2:24 PM

## 2024-11-03 NOTE — Progress Notes (Signed)
 Patient ID: Taylor Obrien, male   DOB: [birth year], 58 y.o.   MRN: [id number] [facility] Progress Note   Assessment/ Plan:   1.  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  Renal function essentially unchanged overnight and possibly may be in the plateau phase of hemodynamically mediated AKI in the setting of hypertensive emergency.  Would recommend monitoring for at least another 24 hours to verify continued renal recovery before discharge.  I will set him up for outpatient nephrology follow-up ([place] location) in 2 weeks anticipating discharge in the next 1 to 2 days. 2.  Hypertensive emergency: Blood pressures gradually improving off of parenteral therapy.  Diastolic blood pressure remains elevated and will monitor with recently uptitrated carvedilol  dose.  He is on BiDil  (RAAS blockers deferred with acute kidney injury). 3.  Anemia: Likely secondary to chronic illness including chronic kidney disease, iron stores borderline with hemoglobin 10.6.  No indication for IV iron/ESA at this time. 4.  Hypocalcemia: Corrected calcium  borderline low at about 8.5.  Low 25-hydroxy vitamin D  level, begin supplementation with ergocalciferol .  Awaiting PTH level.  Subjective:   Without acute events overnight, inquires about disposition/going home.   Objective:   BP (!) 137/108 (BP Location: Left Arm)   Pulse 79   Temp 97.7 F (36.5 C) (Oral)   Resp 18   Ht 5' 6 (1.676 m)   Wt 76.6 kg   SpO2 93%   BMI 27.26 kg/m   Intake/Output Summary (Last 24 hours) at 11/03/2024 0951 Last data filed at 11/03/2024 0834 Gross per 24 hour  Intake 420 ml  Output 1500 ml  Net -1080 ml   Weight change:   Physical Exam: Gen: Appears comfortable resting in bed CVS: Pulse regular rhythm, normal rate, S1 and S2  normal Resp: Clear to auscultation bilaterally, no distinct rales or rhonchi Abd: Soft, flat, nontender, bowel sounds normal Ext: No lower extremity edema.  Relative muscular atrophy noted in his legs/thighs.  Imaging: No results found.  Labs: BMET Recent Labs  Lab 10/30/24 2210 10/31/24 0257 10/31/24 0258 11/01/24 0440 11/02/24 0413 11/03/24 0247  NA 143  --  143 138 139 139  K 4.4  --  3.8 4.3 3.6 3.9  CL 108  --  109 106 107 106  CO2 25  --  24 22 24 22   GLUCOSE 116*  --  120* 117* 147* 105*  BUN 24*  --  24* 33* 40* 45*  CREATININE 3.61* 3.85* 3.86* 4.88* 5.24* 5.10*  CALCIUM  8.7*  --  8.2* 8.1* 7.9* 7.8*  PHOS  --   --  3.9  --   --  3.2   CBC Recent Labs  Lab 10/30/24 2210 10/31/24 0257 11/01/24 0440 11/02/24 0413  WBC 7.2 10.3 7.7 6.6  NEUTROABS 5.3  --   --   --   HGB 14.1 12.6* 10.5* 10.6*  HCT 43.3 38.0* 31.8* 32.0*  MCV 97.7 96.9 96.1 96.4  PLT 249 232 183 186    Medications:     amLODipine   10 mg Oral Daily   aspirin   81 mg Oral Daily   atorvastatin   80 mg Oral Daily   bisacodyl   10 mg Rectal Once   carvedilol   18.75 mg Oral BID WC   Chlorhexidine  Gluconate Cloth  6 each Topical Daily   clopidogrel   75 mg  Oral Daily   escitalopram   10 mg Oral Daily   gabapentin   300 mg Oral QHS   heparin   5,000 Units Subcutaneous Q8H   insulin  aspart  0-15 Units Subcutaneous TID WC   insulin  aspart  0-5 Units Subcutaneous QHS   isosorbide -hydrALAZINE   1 tablet Oral TID   multivitamin with minerals  1 tablet Oral Daily   pantoprazole   40 mg Oral Daily   polyethylene glycol  17 g Oral Daily   senna-docusate  1 tablet Oral BID   Gordy Blanch, MD 11/03/2024, 9:51 AM

## 2024-11-03 NOTE — Progress Notes (Signed)
 Patient refused his medication stated he wants his phone I told patient that I just got here that I did not know anything about a phone and I would find out patient stated he did not want any thing just wanted his damn phone.

## 2024-11-03 NOTE — Plan of Care (Signed)
  Problem: Elimination: Goal: Will not experience complications related to urinary retention Outcome: Progressing   Problem: Skin Integrity: Goal: Risk for impaired skin integrity will decrease Outcome: Progressing   Problem: Safety: Goal: Ability to remain free from injury will improve Outcome: Progressing

## 2024-11-03 NOTE — Progress Notes (Signed)
 1200- RN called transfer units from this admission ([facility] and [place] [facility]). No one has this patient's cell phone or other belongings. This RN cannot find the patient's belongings on 11M either.

## 2024-11-03 NOTE — Progress Notes (Signed)
 Patient is upset that his belongings have not been found so he is refusing to take his medications and his insulin . MD made aware

## 2024-11-04 DIAGNOSIS — I161 Hypertensive emergency: Secondary | ICD-10-CM | POA: Diagnosis not present

## 2024-11-04 LAB — GLUCOSE, CAPILLARY
Glucose-Capillary: 118 mg/dL — ABNORMAL HIGH (ref 70–99)
Glucose-Capillary: 146 mg/dL — ABNORMAL HIGH (ref 70–99)
Glucose-Capillary: 86 mg/dL (ref 70–99)

## 2024-11-04 LAB — RENAL FUNCTION PANEL
Albumin: 2.1 g/dL — ABNORMAL LOW (ref 3.5–5.0)
Anion gap: 9 (ref 5–15)
BUN: 47 mg/dL — ABNORMAL HIGH (ref 6–20)
CO2: 23 mmol/L (ref 22–32)
Calcium: 8.2 mg/dL — ABNORMAL LOW (ref 8.9–10.3)
Chloride: 107 mmol/L (ref 98–111)
Creatinine, Ser: 4.79 mg/dL — ABNORMAL HIGH (ref 0.61–1.24)
GFR, Estimated: 13 mL/min — ABNORMAL LOW (ref >60)
Glucose, Bld: 111 mg/dL — ABNORMAL HIGH (ref 70–99)
Phosphorus: 3.8 mg/dL (ref 2.5–4.6)
Potassium: 4.7 mmol/L (ref 3.5–5.1)
Sodium: 139 mmol/L (ref 135–145)

## 2024-11-04 LAB — CBC
HCT: 33.2 % — ABNORMAL LOW (ref 39.0–52.0)
Hemoglobin: 11 g/dL — ABNORMAL LOW (ref 13.0–17.0)
MCH: 32 pg (ref 26.0–34.0)
MCHC: 33.1 g/dL (ref 30.0–36.0)
MCV: 96.5 fL (ref 80.0–100.0)
Platelets: 210 K/uL (ref 150–400)
RBC: 3.44 MIL/uL — ABNORMAL LOW (ref 4.22–5.81)
RDW: 14.5 % (ref 11.5–15.5)
WBC: 6.5 K/uL (ref 4.0–10.5)
nRBC: 0 % (ref 0.0–0.2)

## 2024-11-04 LAB — MAGNESIUM: Magnesium: 2.1 mg/dL (ref 1.7–2.4)

## 2024-11-04 MED ORDER — CALCITRIOL 0.25 MCG PO CAPS
0.2500 ug | ORAL_CAPSULE | ORAL | Status: DC
  Administered 2024-11-05: 0.25 ug via ORAL
  Filled 2024-11-04 (×2): qty 1

## 2024-11-04 MED ORDER — CARVEDILOL 25 MG PO TABS
25.0000 mg | ORAL_TABLET | Freq: Two times a day (BID) | ORAL | Status: DC
  Administered 2024-11-04 – 2024-11-05 (×3): 25 mg via ORAL
  Filled 2024-11-04 (×4): qty 1

## 2024-11-04 NOTE — Progress Notes (Signed)
   Brief cardiology note:  Blood pressures have remained relatively elevated despite adjustment of medications. Renal function seems to be improving.  Still avoiding ARB/ARNI which leaves BiDil  and beta-blockers plus amlodipine  as her options.  Had previously been on clonidine  which would be last resort. Further titrate carvedilol  to 25 mg twice daily along with amlodipine  10 mg daily and BiDil  at 1 tab 3 times daily He seems euvolemic despite reduced EF, can hold off on loop diuretic as well spironolactone  and SGLT2 inhibitor.   We will continue to monitor him from a distance.   [name], MD

## 2024-11-04 NOTE — TOC Progression Note (Signed)
 Transition of Care [facility]) - Progression Note    Patient Details  Name: JENE ORAVEC MRN: [id number] Date of Birth: 09-19-1966  Transition of Care [facility]) CM/SW Contact  [name], [place] Phone Number: 11/04/2024, 1:24 PM  Clinical Narrative:   CSW uploaded all necessary documents for PAS[facility] to [facility]. CSW has reached out to facilities pending a decision.  CSW will continue to monitor.    Expected Discharge Plan: Home/Self Care Barriers to Discharge: Continued Medical Work up, Family Issues               Expected Discharge Plan and Services In-house Referral: Clinical Social Work     Living arrangements for the past 2 months: Single Family Home                                       Social Drivers of Health (SDOH) Interventions SDOH Screenings   Food Insecurity: Food Insecurity Present (10/31/2024)  Housing: Low Risk  (10/31/2024)  Transportation Needs: No Transportation Needs (10/31/2024)  Utilities: Not At Risk (10/31/2024)  Alcohol Screen: High Risk (04/21/2024)  Tobacco Use: High Risk (10/30/2024)    Readmission Risk Interventions    04/26/2022   11:38 AM 04/25/2022    9:38 AM  Readmission Risk Prevention Plan  Transportation Screening Complete Complete  Home Care Screening  Complete  Medication Review (RN CM)  Complete  HRI or Home Care Consult Complete   Social Work Consult for Recovery Care Planning/Counseling Complete   Palliative Care Screening Not Applicable   Medication Review Oceanographer) Complete

## 2024-11-04 NOTE — Progress Notes (Signed)
 Patient ID: [facility], male   DOB: [birth year], 58 y.o.   MRN: [id number] [facility] Progress Note   Assessment/ Plan:   1.  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  Renal function essentially unchanged overnight and possibly may be in the plateau phase of hemodynamically mediated AKI in the setting of hypertensive emergency.  Kidney function improved on labs from this morning and he may be able to go home as early as today.  He has been set up for outpatient follow-up with nephrology at our [facility] [place] location in 2 weeks (he will be called by the office). 2.  Hypertensive emergency: Blood pressures gradually improving off of parenteral therapy.  Diastolic blood pressure remains elevated and will monitor with recently uptitrated carvedilol  dose.  He is on BiDil  (RAAS blockers deferred with acute kidney injury and will be started as an outpatient based on trajectory of renal function). 3.  Anemia: Likely secondary to chronic illness including chronic kidney disease, iron stores borderline with hemoglobin 10.6.  No indication for IV iron/ESA at this time. 4.  Hypocalcemia: Corrected calcium  borderline low at about 8.5.  Low 25-hydroxy vitamin D  level, started yesterday on weekly ergocalciferol .  Elevated PTH level, begin calcitriol .  Subjective:   No acute complaints at this time except for disappointment that his belongings cannot be traced.  Inquires what he will wear when it is time to go home.   Objective:   BP (!) 157/100 (BP Location: Left Arm)   Pulse 92   Temp 98.4 F (36.9 C)   Resp 16   Ht 5' 6 (1.676 m)   Wt 76.6 kg   SpO2 95%   BMI 27.26 kg/m   Intake/Output Summary (Last 24 hours) at 11/04/2024 0932 Last data filed at 11/04/2024 0743 Gross per 24 hour   Intake 660 ml  Output 2020 ml  Net -1360 ml   Weight change:   Physical Exam: Gen: Sitting up on bedside commode CVS: Pulse regular rhythm, normal rate, S1 and S2 normal Resp: Clear to auscultation bilaterally, no distinct rales or rhonchi Abd: Soft, flat, nontender, bowel sounds normal Ext: No lower extremity edema.    Imaging: No results found.  Labs: BMET Recent Labs  Lab 10/30/24 2210 10/31/24 0257 10/31/24 0258 11/01/24 0440 11/02/24 0413 11/03/24 0247 11/04/24 0404  NA 143  --  143 138 139 139 139  K 4.4  --  3.8 4.3 3.6 3.9 4.7  CL 108  --  109 106 107 106 107  CO2 25  --  24 22 24 22 23   GLUCOSE 116*  --  120* 117* 147* 105* 111*  BUN 24*  --  24* 33* 40* 45* 47*  CREATININE 3.61* 3.85* 3.86* 4.88* 5.24* 5.10* 4.79*  CALCIUM  8.7*  --  8.2* 8.1* 7.9* 7.8* 8.2*  PHOS  --   --  3.9  --   --  3.2 3.8   CBC Recent Labs  Lab 10/30/24 2210 10/31/24 0257 11/01/24 0440 11/02/24 0413 11/04/24 0404  WBC 7.2 10.3 7.7 6.6 6.5  NEUTROABS 5.3  --   --   --   --   HGB 14.1 12.6* 10.5* 10.6* 11.0*  HCT 43.3 38.0* 31.8* 32.0* 33.2*  MCV 97.7 96.9 96.1 96.4 96.5  PLT 249 232 183 186 210    Medications:  amLODipine   10 mg Oral Daily   aspirin   81 mg Oral Daily   atorvastatin   80 mg Oral Daily   bisacodyl   10 mg Rectal Once   carvedilol   18.75 mg Oral BID WC   Chlorhexidine  Gluconate Cloth  6 each Topical Daily   clopidogrel   75 mg Oral Daily   escitalopram   10 mg Oral Daily   gabapentin   300 mg Oral QHS   heparin   5,000 Units Subcutaneous Q8H   insulin  aspart  0-15 Units Subcutaneous TID WC   insulin  aspart  0-5 Units Subcutaneous QHS   isosorbide -hydrALAZINE   1 tablet Oral TID   multivitamin with minerals  1 tablet Oral Daily   pantoprazole   40 mg Oral Daily   polyethylene glycol  17 g Oral Daily   senna-docusate  1 tablet Oral BID   Vitamin D  (Ergocalciferol )  50,000 Units Oral Q7 days   Gordy Blanch, MD 11/04/2024, 9:32 AM

## 2024-11-04 NOTE — Plan of Care (Signed)
 Did agree to meds today. Has sat on side of bed most of day. Still upset over matter of missing items.   Problem: Clinical Measurements: Goal: Ability to maintain clinical measurements within normal limits will improve Outcome: Progressing Goal: Will remain free from infection Outcome: Progressing Goal: Diagnostic test results will improve Outcome: Progressing Goal: Respiratory complications will improve Outcome: Progressing Goal: Cardiovascular complication will be avoided Outcome: Progressing   Problem: Activity: Goal: Risk for activity intolerance will decrease Outcome: Progressing   Problem: Nutrition: Goal: Adequate nutrition will be maintained Outcome: Progressing   Problem: Nutrition: Goal: Adequate nutrition will be maintained Outcome: Progressing   Problem: Coping: Goal: Level of anxiety will decrease Outcome: Not Progressing

## 2024-11-04 NOTE — Progress Notes (Signed)
 PROGRESS NOTE  [facility] FMW:[id number] DOB: 1966/10/01 DOA: 10/30/2024 PCP: Trudy Ave, NP   LOS: 4 days   Brief narrative:  Patient is a 58 year old male with significant past medical history of hypertension, PVD, CVA, CKD stage III, hyperlipidemia, DM2, alcohol abuse, history of alcoholic pancreatitis, major depressive disorder, Suicidal Ideation, PAD and GERD who initially presented to [facility] with shortness of breath, leg swelling and chest tightness.  Patient reported that he was out of his antihypertensive medications for at least 3 weeks and had orthopnea and shortness of breath.  In the ED, systolic blood pressure was more than 200 with diastolic in the 140s.  Patient was given hydralazine  10 mg, Lasix  80 mg IV and was started on nitroglycerin  infusion for hypertensive emergency and CHF exacerbation.  Cardiology was also consulted.  Initial labs were notable for sodium of 143 with glucose of 116 and creatinine of 3.6.  ALT  of 16 and AST of 35 with albumin low at 3.1 and ALP at 70.  proBNP elevated at 6550.  Troponins were elevated at 197 followed by 181.  No leukocytosis was noted.  Chest x-ray showed pulmonary vascular congestion.  EKG showed sinus tachycardia with LVH.  Flu, COVID, RSV PCR was negative.  Review of previous 2D echocardiogram from 06/19/2023 showed a EF of 50 to 55%.  Patient was initially admitted to ICU for hypertensive emergency with AKI..  Subsequently was transferred out of ICU.  Assessment/Plan: Principal Problem:   Hypertensive emergency Active Problems:   AKI (acute kidney injury)   Acute combined systolic and diastolic CHF, NYHA class 3 (HCC)   Hypertensive emergency Acute on chronic CHF exacerbation Elevated proBNP Elevated troponin Dyspnea Review of 2D echocardiogram from  06/19/23-EF 50 to 55%, LV low normal function, LV demonstrates regional wall abnormalities, grade 1 diastolic dysfunction, RV normal, RV systolic function normal, LA  mild to moderate dilation, trivial mitral valve regurg.  Initially received IV Lasix  and was on nitroglycerin  drip but has been transitioned to oral medications.  Patient is currently on hydralazine  p.o. twice daily, hydralazine  as needed, labetalol  300 mg twice daily,  Patient is on  amlodipine , Catapres ,, Coreg , hydralazine  at home.  Continue aspirin , Lipitor , Plavix .  Follow cardiology recommendations.  Blood pressure still elevated but had refused to take medications since he was upset about losing his belongings..   Hypertension, hyperlipidemia, CVA, peripheral vascular disease. Continue statins.  Not a vascular surgery candidate.  No pulses palpable.  Continue aspirin , Plavix .  Was seen by vascular surgery as outpatient 07/01/2024.  No longer ambulating and no plans for aggressive intervention..  AKI on CKD stage IIIb Secondary to hypertensive emergency.  Baseline creatinine is between 1.9-2.0 over the last year.  Creatinine today at 4.7 from 5.1.  Entresto  has been discontinued.  Nephrology following.  Follow nephrology recommendations.  DM 2 Continue sliding scale insulin .  Hemoglobin A1c of 5.8.  POC glucose of 106   EtOH abuse/Tobacco use  History of alcoholic pancreatitis LFTs normal, continue CIWA protocol.  No signs of active withdrawal.  Tolerating oral diet well  MDD Suicidal Attempt 04/21/24 No ongoing suicidal ideation.  Continue Lexapro  from home   GERD Continue Protonix    Constipation.  Continue Senokot MiraLAX   Deconditioning debility.  Seen by physical therapy and recommend skilled nursing facility placement.  DVT prophylaxis: heparin  injection 5,000 Units Start: 10/31/24 0600 SCDs Start: 10/31/24 0241   Disposition: TOC on board.  PT has recommended skilled nursing facility placement   Status  is: Inpatient Remains inpatient appropriate because: Pending clinical improvement, awaiting improvement in renal function and need for rehabitation    Code Status:      Code Status: Full Code  Family Communication: None at bedside  Consultants: Cardiology Nephrology  Procedures: None  Anti-infectives:  None  Anti-infectives (From admission, onward)    None        Subjective: Today, patient was seen and examined at bedside.  Patient states that he is upset that he does not have any of his belongings in the hospital and nursing staff reported that he did not wish to take his medications and insulin .  Counseled about importance of medication for  Objective: Vitals:   11/04/24 0533 11/04/24 0811  BP: (!) 169/110 (!) 157/100  Pulse: 88 92  Resp: 18 16  Temp: 98.3 F (36.8 C) 98.4 F (36.9 C)  SpO2: 99% 95%    Intake/Output Summary (Last 24 hours) at 11/04/2024 1107 Last data filed at 11/04/2024 1000 Gross per 24 hour  Intake 660 ml  Output 2295 ml  Net -1635 ml   Filed Weights   10/30/24 2114 11/01/24 0500  Weight: 99.8 kg 76.6 kg   Body mass index is 27.26 kg/m.   Physical Exam:  General:  Average built, not in obvious distress, alert awake and oriented, Communicative HENT:   No scleral pallor or icterus noted. Oral mucosa is moist.  Chest:  Clear breath sounds. No crackles or wheezes.  CVS: S1 &S2 heard. No murmur.  Regular rate and rhythm. Abdomen: Soft, nontender, nondistended.  Bowel sounds are heard.   Extremities: No cyanosis, clubbing with bilateral lower extremity trace edema peripheral pulses are palpable. Psych: Alert, awake and oriented, normal mood CNS:  No cranial nerve deficits.  Mild left-sided weakness from previous stroke. Skin: Warm and dry.  No rashes noted.   Data Review: I have personally reviewed the following laboratory data and studies,  CBC: Recent Labs  Lab 10/30/24 2210 10/31/24 0257 11/01/24 0440 11/02/24 0413 11/04/24 0404  WBC 7.2 10.3 7.7 6.6 6.5  NEUTROABS 5.3  --   --   --   --   HGB 14.1 12.6* 10.5* 10.6* 11.0*  HCT 43.3 38.0* 31.8* 32.0* 33.2*  MCV 97.7 96.9 96.1 96.4 96.5   PLT 249 232 183 186 210   Basic Metabolic Panel: Recent Labs  Lab 10/31/24 0258 11/01/24 0440 11/02/24 0413 11/03/24 0247 11/04/24 0404  NA 143 138 139 139 139  K 3.8 4.3 3.6 3.9 4.7  CL 109 106 107 106 107  CO2 24 22 24 22 23   GLUCOSE 120* 117* 147* 105* 111*  BUN 24* 33* 40* 45* 47*  CREATININE 3.86* 4.88* 5.24* 5.10* 4.79*  CALCIUM  8.2* 8.1* 7.9* 7.8* 8.2*  MG 1.7 2.3 2.1  --  2.1  PHOS 3.9  --   --  3.2 3.8   Liver Function Tests: Recent Labs  Lab 10/30/24 2210 11/03/24 0247 11/04/24 0404  AST 35  --   --   ALT 16  --   --   ALKPHOS 70  --   --   BILITOT 0.2  --   --   PROT 6.3*  --   --   ALBUMIN 3.1* 1.9* 2.1*   Recent Labs  Lab 10/31/24 0257  LIPASE 23   No results for input(s): AMMONIA in the last 168 hours. Cardiac Enzymes: No results for input(s): CKTOTAL, CKMB, CKMBINDEX, TROPONINI in the last 168 hours. BNP (last 3 results) No results  for input(s): BNP in the last 8760 hours.  ProBNP (last 3 results) Recent Labs    10/30/24 2210  PROBNP 6,550.0*    CBG: Recent Labs  Lab 11/02/24 1950 11/03/24 0727 11/03/24 1146 11/03/24 1619 11/03/24 2225  GLUCAP 112* 106* 147* 122* 106*   Recent Results (from the past 240 hours)  Resp panel by RT-PCR (RSV, Flu A&B, Covid) Anterior Nasal Swab     Status: None   Collection Time: 10/30/24  9:36 PM   Specimen: Anterior Nasal Swab  Result Value Ref Range Status   SARS Coronavirus 2 by RT PCR NEGATIVE NEGATIVE Final    Comment: (NOTE) SARS-CoV-2 target nucleic acids are NOT DETECTED.  The SARS-CoV-2 RNA is generally detectable in upper respiratory specimens during the acute phase of infection. The lowest concentration of SARS-CoV-2 viral copies this assay can detect is 138 copies/mL. A negative result does not preclude SARS-Cov-2 infection and should not be used as the sole basis for treatment or other patient management decisions. A negative result may occur with  improper specimen  collection/handling, submission of specimen other than nasopharyngeal swab, presence of viral mutation(s) within the areas targeted by this assay, and inadequate number of viral copies(<138 copies/mL). A negative result must be combined with clinical observations, patient history, and epidemiological information. The expected result is Negative.  Fact Sheet for Patients:  bloggercourse.com  Fact Sheet for Healthcare Providers:  seriousbroker.it  This test is no t yet approved or cleared by the [place]  FDA and  has been authorized for detection and/or diagnosis of SARS-CoV-2 by FDA under an Emergency Use Authorization (EUA). This EUA will remain  in effect (meaning this test can be used) for the duration of the COVID-19 declaration under Section 564(b)(1) of the Act, 21 U.S.C.section 360bbb-3(b)(1), unless the authorization is terminated  or revoked sooner.       Influenza A by PCR NEGATIVE NEGATIVE Final   Influenza B by PCR NEGATIVE NEGATIVE Final    Comment: (NOTE) The Xpert Xpress SARS-CoV-2/FLU/RSV plus assay is intended as an aid in the diagnosis of influenza from Nasopharyngeal swab specimens and should not be used as a sole basis for treatment. Nasal washings and aspirates are unacceptable for Xpert Xpress SARS-CoV-2/FLU/RSV testing.  Fact Sheet for Patients: bloggercourse.com  Fact Sheet for Healthcare Providers: seriousbroker.it  This test is not yet approved or cleared by the [place]  FDA and has been authorized for detection and/or diagnosis of SARS-CoV-2 by FDA under an Emergency Use Authorization (EUA). This EUA will remain in effect (meaning this test can be used) for the duration of the COVID-19 declaration under Section 564(b)(1) of the Act, 21 U.S.C. section 360bbb-3(b)(1), unless the authorization is terminated or revoked.     Resp Syncytial  Virus by PCR NEGATIVE NEGATIVE Final    Comment: (NOTE) Fact Sheet for Patients: bloggercourse.com  Fact Sheet for Healthcare Providers: seriousbroker.it  This test is not yet approved or cleared by the [place]  FDA and has been authorized for detection and/or diagnosis of SARS-CoV-2 by FDA under an Emergency Use Authorization (EUA). This EUA will remain in effect (meaning this test can be used) for the duration of the COVID-19 declaration under Section 564(b)(1) of the Act, 21 U.S.C. section 360bbb-3(b)(1), unless the authorization is terminated or revoked.  Performed at [facility], [address] [place], [place] [address]   MRSA Next Gen by PCR, Nasal     Status: None   Collection Time: 10/31/24  2:41 AM   Specimen:  Nasal Mucosa; Nasal Swab  Result Value Ref Range Status   MRSA by PCR Next Gen NOT DETECTED NOT DETECTED Final    Comment: (NOTE) The GeneXpert MRSA Assay (FDA approved for NASAL specimens only), is one component of a comprehensive MRSA colonization surveillance program. It is not intended to diagnose MRSA infection nor to guide or monitor treatment for MRSA infections. Test performance is not FDA approved in patients less than 46 years old. Performed at [facility], [address], [place], [place] [address]      Studies: No results found.     Vernal Alstrom, MD  [name] 11/04/2024  If 7PM-7AM, please contact night-coverage

## 2024-11-04 NOTE — Progress Notes (Signed)
 I called [facility]Security and they denied having the pt's cell phone, wallet, and pants. I also called [facility] to see if lost during transport from [facility] to here. The person who transported him reported he had no belongings. The pt said he had asked his daughter and she did not take them home. Pt also reports losing w/c pad. I asked him to call his cell phone number and he reported he did not know his cell phone number. I asked him to check with his daughter about this. I called the mobile number listed in the chart and it just rang, no answer. I informed pt of efforts. He expressed thanks and was calm.

## 2024-11-05 DIAGNOSIS — I161 Hypertensive emergency: Secondary | ICD-10-CM | POA: Diagnosis not present

## 2024-11-05 LAB — GLUCOSE, CAPILLARY
Glucose-Capillary: 96 mg/dL (ref 70–99)
Glucose-Capillary: 122 mg/dL — ABNORMAL HIGH (ref 70–99)
Glucose-Capillary: 116 mg/dL — ABNORMAL HIGH (ref 70–99)
Glucose-Capillary: 141 mg/dL — ABNORMAL HIGH (ref 70–99)

## 2024-11-05 LAB — RENAL FUNCTION PANEL
Albumin: 2.1 g/dL — ABNORMAL LOW (ref 3.5–5.0)
Anion gap: 9 (ref 5–15)
BUN: 51 mg/dL — ABNORMAL HIGH (ref 6–20)
CO2: 22 mmol/L (ref 22–32)
Calcium: 8.1 mg/dL — ABNORMAL LOW (ref 8.9–10.3)
Chloride: 106 mmol/L (ref 98–111)
Creatinine, Ser: 4.78 mg/dL — ABNORMAL HIGH (ref 0.61–1.24)
GFR, Estimated: 14 mL/min — ABNORMAL LOW (ref >60)
Glucose, Bld: 104 mg/dL — ABNORMAL HIGH (ref 70–99)
Phosphorus: 3.5 mg/dL (ref 2.5–4.6)
Potassium: 4.4 mmol/L (ref 3.5–5.1)
Sodium: 137 mmol/L (ref 135–145)

## 2024-11-05 NOTE — Progress Notes (Signed)
 Continues to be hypotensive but will function somewhat plateaued at 4.78. Carvedilol  was increased 25 mg twice daily yesterday-for additional blood pressure we may either need to increase BiDil  to 2 tabs 3 times daily versus add clonidine .  Continues to remain somewhat euvolemic and defer diuretics to nephrology.  Similarly, would not consider SGLT2 inhibitor and spironolactone .  Cardiology continue to follow from a distance.  Please do not hesitate to contact for questions.  [name], MD

## 2024-11-05 NOTE — Progress Notes (Signed)
 PROGRESS NOTE  Taylor Obrien FMW:[id number] DOB: [birth year] DOA: 10/30/2024 PCP: Trudy Ave, NP   LOS: 5 days   Brief narrative:  Patient is a 58 year old male with significant past medical history of hypertension, PVD, CVA, CKD stage III, hyperlipidemia, DM2, alcohol abuse, history of alcoholic pancreatitis, major depressive disorder, Suicidal Ideation, PAD and GERD who initially presented to [facility] with shortness of breath, leg swelling and chest tightness.  Patient reported that he was out of his antihypertensive medications for at least 3 weeks and had orthopnea and shortness of breath.  In the ED, systolic blood pressure was more than 200 with diastolic in the 140s.  Patient was given hydralazine  10 mg, Lasix  80 mg IV and was started on nitroglycerin  infusion for hypertensive emergency and CHF exacerbation.  Cardiology was also consulted.  Initial labs were notable for sodium of 143 with glucose of 116 and creatinine of 3.6.  ALT  of 16 and AST of 35 with albumin low at 3.1 and ALP at 70.  proBNP elevated at 6550.  Troponins were elevated at 197 followed by 181.  No leukocytosis was noted.  Chest x-ray showed pulmonary vascular congestion.  EKG showed sinus tachycardia with LVH.  Flu, COVID, RSV PCR was negative.  Review of previous 2D echocardiogram from 06/19/2023 showed a EF of 50 to 55%.  Patient was initially admitted to ICU for hypertensive emergency with AKI. Subsequently was transferred out of ICU.  Assessment/Plan: Principal Problem:   Hypertensive emergency Active Problems:   AKI (acute kidney injury)   Acute combined systolic and diastolic CHF, NYHA class 3 (HCC)   Hypertensive emergency Acute on chronic CHF exacerbation Elevated proBNP Elevated troponin Dyspnea Review of 2D echocardiogram from  06/19/23-EF 50 to 55%, LV low normal function, LV demonstrates regional wall abnormalities, grade 1 diastolic dysfunction, RV normal, RV systolic function normal, LA  mild to moderate dilation, trivial mitral valve regurg.  Initially received IV Lasix  and was on nitroglycerin  drip but has been transitioned to oral medications.  Patient is currently on BiDil , Coreg , amlodipine .  Continue aspirin , Lipitor , Plavix .  Cardiology followed the patient during hospitalization.   Hypertension, hyperlipidemia, CVA, peripheral vascular disease. Continue statins.  Not a vascular surgery candidate.  No pulses palpable.  Continue aspirin , Plavix .  Was seen by vascular surgery as outpatient 07/01/2024.  No longer ambulating and no plans for aggressive intervention..  AKI on CKD stage IIIb Secondary to hypertensive emergency.  Baseline creatinine is between 1.9-2.0 over the last year.  Creatinine today at 4.7 from 4.7 f< 5.1.  Nephrology following.  Patient is negative balance for 5568 mL so far.  DM 2 Continue sliding scale insulin .  Hemoglobin A1c of 5.8.  POC glucose of 96   EtOH abuse/Tobacco use  History of alcoholic pancreatitis LFTs normal, continue CIWA protocol.  No signs of active withdrawal.  Tolerating oral diet well  MDD Suicidal Attempt 04/21/24 No ongoing suicidal ideation.  Continue Lexapro  from home   GERD Continue Protonix    Constipation.  Continue Senokot, MiraLAX   Deconditioning debility.  Seen by physical therapy and recommend skilled nursing facility placement.  DVT prophylaxis: heparin  injection 5,000 Units Start: 10/31/24 0600 SCDs Start: 10/31/24 0241   Disposition: TOC on board.  PT has recommended skilled nursing facility placement   Status is: Inpatient Remains inpatient appropriate because: Pending clinical improvement, awaiting improvement in renal function and need for rehabitation    Code Status:     Code Status: Full Code  Family Communication:  None at bedside  Consultants: Cardiology Nephrology  Procedures: None  Anti-infectives:  None  Anti-infectives (From admission, onward)    None        Subjective: Today, patient was seen and examined at bedside.  Patient feels frustrated that he has lost his belongings.  Has had bowel movements.  No nausea vomiting shortness of breath or dyspnea.    Objective: Vitals:   11/05/24 0517 11/05/24 0733  BP: (!) 146/78 (!) 180/93  Pulse: 77 89  Resp: 17 18  Temp: 98.3 F (36.8 C) 98.4 F (36.9 C)  SpO2: 100% 100%    Intake/Output Summary (Last 24 hours) at 11/05/2024 0915 Last data filed at 11/05/2024 0900 Gross per 24 hour  Intake 300 ml  Output 1525 ml  Net -1225 ml   Filed Weights   10/30/24 2114 11/01/24 0500  Weight: 99.8 kg 76.6 kg   Body mass index is 27.26 kg/m.   Physical Exam:  General:  Average built, not in obvious distress, alert awake and oriented, Communicative HENT:   No scleral pallor or icterus noted. Oral mucosa is moist.  Chest:  Clear breath sounds. No crackles or wheezes.  CVS: S1 &S2 heard. No murmur.  Regular rate and rhythm. Abdomen: Soft, nontender, nondistended.  Bowel sounds are heard.   Extremities: No cyanosis, clubbing with bilateral lower extremity trace edema, peripheral pulses are palpable. Psych: Alert, awake and oriented, normal mood CNS:  No cranial nerve deficits.  Mild left-sided weakness from previous stroke. Skin: Warm and dry.  No rashes noted.   Data Review: I have personally reviewed the following laboratory data and studies,  CBC: Recent Labs  Lab 10/30/24 2210 10/31/24 0257 11/01/24 0440 11/02/24 0413 11/04/24 0404  WBC 7.2 10.3 7.7 6.6 6.5  NEUTROABS 5.3  --   --   --   --   HGB 14.1 12.6* 10.5* 10.6* 11.0*  HCT 43.3 38.0* 31.8* 32.0* 33.2*  MCV 97.7 96.9 96.1 96.4 96.5  PLT 249 232 183 186 210   Basic Metabolic Panel: Recent Labs  Lab 10/31/24 0258 11/01/24 0440 11/02/24 0413 11/03/24 0247 11/04/24 0404 11/05/24 0303  NA 143 138 139 139 139 137  K 3.8 4.3 3.6 3.9 4.7 4.4  CL 109 106 107 106 107 106  CO2 24 22 24 22 23 22   GLUCOSE 120* 117* 147*  105* 111* 104*  BUN 24* 33* 40* 45* 47* 51*  CREATININE 3.86* 4.88* 5.24* 5.10* 4.79* 4.78*  CALCIUM  8.2* 8.1* 7.9* 7.8* 8.2* 8.1*  MG 1.7 2.3 2.1  --  2.1  --   PHOS 3.9  --   --  3.2 3.8 3.5   Liver Function Tests: Recent Labs  Lab 10/30/24 2210 11/03/24 0247 11/04/24 0404 11/05/24 0303  AST 35  --   --   --   ALT 16  --   --   --   ALKPHOS 70  --   --   --   BILITOT 0.2  --   --   --   PROT 6.3*  --   --   --   ALBUMIN 3.1* 1.9* 2.1* 2.1*   Recent Labs  Lab 10/31/24 0257  LIPASE 23   No results for input(s): AMMONIA in the last 168 hours. Cardiac Enzymes: No results for input(s): CKTOTAL, CKMB, CKMBINDEX, TROPONINI in the last 168 hours. BNP (last 3 results) No results for input(s): BNP in the last 8760 hours.  ProBNP (last 3 results) Recent Labs  10/30/24 2210  PROBNP 6,550.0*    CBG: Recent Labs  Lab 11/03/24 2225 11/04/24 1132 11/04/24 1731 11/04/24 2121 11/05/24 0732  GLUCAP 106* 118* 146* 86 96   Recent Results (from the past 240 hours)  Resp panel by RT-PCR (RSV, Flu A&B, Covid) Anterior Nasal Swab     Status: None   Collection Time: 10/30/24  9:36 PM   Specimen: Anterior Nasal Swab  Result Value Ref Range Status   SARS Coronavirus 2 by RT PCR NEGATIVE NEGATIVE Final    Comment: (NOTE) SARS-CoV-2 target nucleic acids are NOT DETECTED.  The SARS-CoV-2 RNA is generally detectable in upper respiratory specimens during the acute phase of infection. The lowest concentration of SARS-CoV-2 viral copies this assay can detect is 138 copies/mL. A negative result does not preclude SARS-Cov-2 infection and should not be used as the sole basis for treatment or other patient management decisions. A negative result may occur with  improper specimen collection/handling, submission of specimen other than nasopharyngeal swab, presence of viral mutation(s) within the areas targeted by this assay, and inadequate number of viral copies(<138  copies/mL). A negative result must be combined with clinical observations, patient history, and epidemiological information. The expected result is Negative.  Fact Sheet for Patients:  bloggercourse.com  Fact Sheet for Healthcare Providers:  seriousbroker.it  This test is no t yet approved or cleared by the [place]  FDA and  has been authorized for detection and/or diagnosis of SARS-CoV-2 by FDA under an Emergency Use Authorization (EUA). This EUA will remain  in effect (meaning this test can be used) for the duration of the COVID-19 declaration under Section 564(b)(1) of the Act, 21 U.S.C.section 360bbb-3(b)(1), unless the authorization is terminated  or revoked sooner.       Influenza A by PCR NEGATIVE NEGATIVE Final   Influenza B by PCR NEGATIVE NEGATIVE Final    Comment: (NOTE) The Xpert Xpress SARS-CoV-2/FLU/RSV plus assay is intended as an aid in the diagnosis of influenza from Nasopharyngeal swab specimens and should not be used as a sole basis for treatment. Nasal washings and aspirates are unacceptable for Xpert Xpress SARS-CoV-2/FLU/RSV testing.  Fact Sheet for Patients: bloggercourse.com  Fact Sheet for Healthcare Providers: seriousbroker.it  This test is not yet approved or cleared by the [place]  FDA and has been authorized for detection and/or diagnosis of SARS-CoV-2 by FDA under an Emergency Use Authorization (EUA). This EUA will remain in effect (meaning this test can be used) for the duration of the COVID-19 declaration under Section 564(b)(1) of the Act, 21 U.S.C. section 360bbb-3(b)(1), unless the authorization is terminated or revoked.     Resp Syncytial Virus by PCR NEGATIVE NEGATIVE Final    Comment: (NOTE) Fact Sheet for Patients: bloggercourse.com  Fact Sheet for Healthcare  Providers: seriousbroker.it  This test is not yet approved or cleared by the [place]  FDA and has been authorized for detection and/or diagnosis of SARS-CoV-2 by FDA under an Emergency Use Authorization (EUA). This EUA will remain in effect (meaning this test can be used) for the duration of the COVID-19 declaration under Section 564(b)(1) of the Act, 21 U.S.C. section 360bbb-3(b)(1), unless the authorization is terminated or revoked.  Performed at [facility], [address], [place], [place] [address]   MRSA Next Gen by PCR, Nasal     Status: None   Collection Time: 10/31/24  2:41 AM   Specimen: Nasal Mucosa; Nasal Swab  Result Value Ref Range Status   MRSA by PCR Next Gen NOT  DETECTED NOT DETECTED Final    Comment: (NOTE) The GeneXpert MRSA Assay (FDA approved for NASAL specimens only), is one component of a comprehensive MRSA colonization surveillance program. It is not intended to diagnose MRSA infection nor to guide or monitor treatment for MRSA infections. Test performance is not FDA approved in patients less than 1 years old. Performed at [facility], [address], [place], [place] [address]      Studies: No results found.     Vernal Alstrom, MD  [name] 11/05/2024  If 7PM-7AM, please contact night-coverage

## 2024-11-05 NOTE — Progress Notes (Signed)
 Patient ID: Taylor Obrien, male   DOB: 1966/11/29, 58 y.o.   MRN: [id number] [facility] Progress Note   Assessment/ Plan:   1.  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  Renal function essentially unchanged overnight and possibly may be in the plateau phase of hemodynamically mediated AKI in the setting of hypertensive emergency; I suspect that he will have sluggish renal recovery with ongoing blood pressure/glycemic control.  He has been set up for outpatient follow-up with nephrology at our [facility] [place] location in 2 weeks (he will be called by the office). 2.  Hypertensive emergency: Blood pressures intermittently elevated prompting uptitration of carvedilol  dose yesterday.  He is on BiDil  (RAAS blockers deferred with acute kidney injury). 3.  Anemia: Likely secondary to chronic illness including chronic kidney disease, iron stores borderline with hemoglobin 11.0.  No indication for IV iron/ESA at this time. 4.  Hypocalcemia: Corrected calcium  borderline low at about 8.5.  On ergocalciferol  for vitamin D  deficiency and calcitriol  3 days a week for elevated PTH.  He has been set up for outpatient follow-up in 2 weeks at the [facility] office in [place].  Nephrology will sign off at this time and remain available for questions or concerns.  Subjective:   Expresses discontent/frustration at losing his belongings (nursing staff attempted to trace exactly where but were unsuccessful).   Objective:   BP (!) 180/93 (BP Location: Left Arm)   Pulse 89   Temp 98.4 F (36.9 C) (Oral)   Resp 18   Ht 5' 6 (1.676 m)   Wt 76.6 kg   SpO2 100%   BMI 27.26 kg/m   Intake/Output Summary (Last 24 hours) at 11/05/2024 0930 Last data filed at 11/05/2024 0900 Gross per 24  hour  Intake 300 ml  Output 1525 ml  Net -1225 ml   Weight change:   Physical Exam: Gen: Sleeping comfortably in bed, awakens to conversation CVS: Pulse regular rhythm, normal rate, S1 and S2 normal Resp: Clear to auscultation bilaterally, no distinct rales or rhonchi Abd: Soft, flat, nontender, bowel sounds normal Ext: No lower extremity edema.    Imaging: No results found.  Labs: BMET Recent Labs  Lab 10/30/24 2210 10/31/24 0257 10/31/24 0258 11/01/24 0440 11/02/24 0413 11/03/24 0247 11/04/24 0404 11/05/24 0303  NA 143  --  143 138 139 139 139 137  K 4.4  --  3.8 4.3 3.6 3.9 4.7 4.4  CL 108  --  109 106 107 106 107 106  CO2 25  --  24 22 24 22 23 22   GLUCOSE 116*  --  120* 117* 147* 105* 111* 104*  BUN 24*  --  24* 33* 40* 45* 47* 51*  CREATININE 3.61* 3.85* 3.86* 4.88* 5.24* 5.10* 4.79* 4.78*  CALCIUM  8.7*  --  8.2* 8.1* 7.9* 7.8* 8.2* 8.1*  PHOS  --   --  3.9  --   --  3.2 3.8 3.5   CBC Recent Labs  Lab 10/30/24 2210 10/31/24 0257 11/01/24 0440 11/02/24 0413 11/04/24 0404  WBC 7.2 10.3 7.7 6.6 6.5  NEUTROABS 5.3  --   --   --   --   HGB 14.1 12.6* 10.5* 10.6* 11.0*  HCT 43.3 38.0* 31.8* 32.0* 33.2*  MCV 97.7 96.9 96.1 96.4 96.5  PLT 249 232 183 186 210  Medications:     amLODipine   10 mg Oral Daily   aspirin   81 mg Oral Daily   atorvastatin   80 mg Oral Daily   bisacodyl   10 mg Rectal Once   calcitRIOL   0.25 mcg Oral Q M,W,F   carvedilol   25 mg Oral BID WC   Chlorhexidine  Gluconate Cloth  6 each Topical Daily   clopidogrel   75 mg Oral Daily   escitalopram   10 mg Oral Daily   gabapentin   300 mg Oral QHS   heparin   5,000 Units Subcutaneous Q8H   insulin  aspart  0-15 Units Subcutaneous TID WC   insulin  aspart  0-5 Units Subcutaneous QHS   isosorbide -hydrALAZINE   1 tablet Oral TID   multivitamin with minerals  1 tablet Oral Daily   pantoprazole   40 mg Oral Daily   polyethylene glycol  17 g Oral Daily   senna-docusate  1 tablet Oral BID    Vitamin D  (Ergocalciferol )  50,000 Units Oral Q7 days   Gordy Blanch, MD 11/05/2024, 9:30 AM

## 2024-11-05 NOTE — Progress Notes (Signed)
 Mobility Specialist: Progress Note   11/05/24 1500  Mobility  Activity Pivoted/transferred from bed to chair  Level of Assistance Standby assist, set-up cues, supervision of patient - no hands on  Assistive Device Stedy  Activity Response Tolerated well  Mobility Referral Yes  Mobility visit 1 Mobility  Mobility Specialist Start Time (ACUTE ONLY) 1045  Mobility Specialist Stop Time (ACUTE ONLY) 1057  Mobility Specialist Time Calculation (min) (ACUTE ONLY) 12 min    Pt received in bed, agreeable to mobility session. SV throughout with stedy. Tolerated very well. Left in chair, agreed to sit up for awhile. All needs met, call bell in reach. Chair alarm on.   Taylor Obrien Mobility Specialist Please contact via SecureChat or Rehab office at [phone]

## 2024-11-05 NOTE — Progress Notes (Signed)
 Mobility Specialist: Progress Note   11/05/24 1552  Mobility  Activity Pivoted/transferred from chair to bed  Level of Assistance Moderate assist, patient does 50-74%  Assistive Device Other (Comment) (HHA)  Activity Response Tolerated well  Mobility Referral Yes  Mobility visit 1 Mobility  Mobility Specialist Start Time (ACUTE ONLY) 1302  Mobility Specialist Stop Time (ACUTE ONLY) 1312  Mobility Specialist Time Calculation (min) (ACUTE ONLY) 10 min    Pt requesting assistance back to bed. Attempted to stand pivot without stedy, tolerated okay. ModA to support L side and pt was able to pivot over to EOB with R foot. Returned to supine with minA. Left in bed with all needs met, call bell in reach. Bed alarm on.   Ileana Lute Mobility Specialist Please contact via SecureChat or Rehab office at [phone]

## 2024-11-05 NOTE — Plan of Care (Signed)
  Problem: Education: Goal: Knowledge of General Education information will improve Description: Including pain rating scale, medication(s)/side effects and non-pharmacologic comfort measures Outcome: Progressing   Problem: Coping: Goal: Level of anxiety will decrease Outcome: Progressing   Problem: Skin Integrity: Goal: Risk for impaired skin integrity will decrease Outcome: Progressing   

## 2024-11-05 NOTE — TOC Progression Note (Signed)
 Transition of Care [facility]) - Progression Note    Patient Details  Name: LOUKAS ANTONSON MRN: [id number] Date of Birth: 1966/01/02  Transition of Care [facility]) CM/SW Contact  Tom-Johnson, Kashana Breach Daphne, RN Phone Number: 11/05/2024, 4:17 PM  Clinical Narrative:     CM notified by LCSW that patient declined SNF, requesting Home health disciplines with no preference. CM notified MD and sent referral via HUB, awaiting response.  CM will continue to follow as patient progresses with care towards discharge.        Expected Discharge Plan: Home/Self Care Barriers to Discharge: Continued Medical Work up, Family Issues               Expected Discharge Plan and Services In-house Referral: Clinical Social Work     Living arrangements for the past 2 months: Single Family Home                                       Social Drivers of Health (SDOH) Interventions SDOH Screenings   Food Insecurity: Food Insecurity Present (10/31/2024)  Housing: Low Risk  (10/31/2024)  Transportation Needs: No Transportation Needs (10/31/2024)  Utilities: Not At Risk (10/31/2024)  Alcohol Screen: High Risk (04/21/2024)  Tobacco Use: High Risk (10/30/2024)    Readmission Risk Interventions    04/26/2022   11:38 AM 04/25/2022    9:38 AM  Readmission Risk Prevention Plan  Transportation Screening Complete Complete  Home Care Screening  Complete  Medication Review (RN CM)  Complete  HRI or Home Care Consult Complete   Social Work Consult for Recovery Care Planning/Counseling Complete   Palliative Care Screening Not Applicable   Medication Review Oceanographer) Complete

## 2024-11-05 NOTE — TOC Progression Note (Addendum)
 Transition of Care [facility]) - Progression Note    Patient Details  Name: Taylor Obrien MRN: [id number] Date of Birth: August 26, 1966  Transition of Care [facility]) CM/SW Contact  [name], [place] Phone Number: 11/05/2024, 12:45 PM  Clinical Narrative:  CSW visited pt at bedside to update them on the process for STR.   CSW asked if they were still willing to go to STR and the pt stated I don't know, all I can think about is my stuff.  Pt voiced frustrations about lost items. CSW offered comfort and validated feelings of frustrations. CSW then mentioned that the lost items could be replaced and CSW offered to write down numbers for the pt to call. Pt was agreeable and CSW gave the pt the numbers to DSS, [employer], and their bank.  1530 - RN notified CSW that the pt wants to discontinue dc plan of SNF and wants to go home.  CSW spoke to the pt at bedside and the pt confirmed that they want to go home. CSW asked if the pt would be willing to go home w/ [facility] and the pt was agreeable. CSW notified RNCM.   CSW spoke to pt's daughter [name] via phone and stated the dc plan of HH and that the pt will need assistance when [facility] is not present. [name] stated that she is her father's aide and that she will be able to help.   CSW will continue to monitor.      Expected Discharge Plan: Home/Self Care Barriers to Discharge: Continued Medical Work up, Family Issues               Expected Discharge Plan and Services In-house Referral: Clinical Social Work     Living arrangements for the past 2 months: Single Family Home                                       Social Drivers of Health (SDOH) Interventions SDOH Screenings   Food Insecurity: Food Insecurity Present (10/31/2024)  Housing: Low Risk  (10/31/2024)  Transportation Needs: No Transportation Needs (10/31/2024)  Utilities: Not At Risk (10/31/2024)  Alcohol Screen: High Risk (04/21/2024)  Tobacco Use: High Risk (10/30/2024)     Readmission Risk Interventions    04/26/2022   11:38 AM 04/25/2022    9:38 AM  Readmission Risk Prevention Plan  Transportation Screening Complete Complete  Home Care Screening  Complete  Medication Review (RN CM)  Complete  HRI or Home Care Consult Complete   Social Work Consult for Recovery Care Planning/Counseling Complete   Palliative Care Screening Not Applicable   Medication Review Oceanographer) Complete

## 2024-11-06 DIAGNOSIS — I739 Peripheral vascular disease, unspecified: Secondary | ICD-10-CM

## 2024-11-06 DIAGNOSIS — I420 Dilated cardiomyopathy: Secondary | ICD-10-CM | POA: Diagnosis not present

## 2024-11-06 DIAGNOSIS — E7800 Pure hypercholesterolemia, unspecified: Secondary | ICD-10-CM

## 2024-11-06 DIAGNOSIS — I5021 Acute systolic (congestive) heart failure: Secondary | ICD-10-CM | POA: Diagnosis not present

## 2024-11-06 DIAGNOSIS — R7989 Other specified abnormal findings of blood chemistry: Secondary | ICD-10-CM | POA: Diagnosis not present

## 2024-11-06 DIAGNOSIS — I161 Hypertensive emergency: Secondary | ICD-10-CM | POA: Diagnosis not present

## 2024-11-06 LAB — RENAL FUNCTION PANEL
Albumin: 2.5 g/dL — ABNORMAL LOW (ref 3.5–5.0)
Anion gap: 11 (ref 5–15)
BUN: 59 mg/dL — ABNORMAL HIGH (ref 6–20)
CO2: 21 mmol/L — ABNORMAL LOW (ref 22–32)
Calcium: 8.7 mg/dL — ABNORMAL LOW (ref 8.9–10.3)
Chloride: 106 mmol/L (ref 98–111)
Creatinine, Ser: 4.98 mg/dL — ABNORMAL HIGH (ref 0.61–1.24)
GFR, Estimated: 13 mL/min — ABNORMAL LOW (ref >60)
Glucose, Bld: 111 mg/dL — ABNORMAL HIGH (ref 70–99)
Phosphorus: 4.1 mg/dL (ref 2.5–4.6)
Potassium: 4.9 mmol/L (ref 3.5–5.1)
Sodium: 138 mmol/L (ref 135–145)

## 2024-11-06 MED ORDER — CARVEDILOL 25 MG PO TABS: 25.0000 mg | ORAL_TABLET | Freq: Two times a day (BID) | ORAL | 2 refills | Status: DC

## 2024-11-06 MED ORDER — ISOSORB DINITRATE-HYDRALAZINE 20-37.5 MG PO TABS
2.0000 | ORAL_TABLET | Freq: Three times a day (TID) | ORAL | Status: DC
Start: 2024-11-06 — End: 2024-11-06

## 2024-11-06 MED ORDER — CALCITRIOL 0.25 MCG PO CAPS: 0.2500 ug | ORAL_CAPSULE | ORAL | 0 refills | Status: AC

## 2024-11-06 MED ORDER — ISOSORB DINITRATE-HYDRALAZINE 20-37.5 MG PO TABS: 2.0000 | ORAL_TABLET | Freq: Three times a day (TID) | ORAL | 2 refills | Status: DC

## 2024-11-06 MED ORDER — VITAMIN D (ERGOCALCIFEROL) 1.25 MG (50000 UNIT) PO CAPS: 50000.0000 [IU] | ORAL_CAPSULE | ORAL | 0 refills | Status: DC

## 2024-11-06 NOTE — Progress Notes (Addendum)
 Progress Note  Patient Name: Taylor Obrien Date of Encounter: 11/06/2024 [facility] Cardiologist: [name], MD  Interval Summary   BP remains elevated despite increase in Carvedilol  yesterday.  No CP or SOB  Vital Signs Vitals:   11/05/24 0733 11/05/24 1623 11/05/24 1943 11/06/24 0421  BP: (!) 180/93 (!) 165/93 (!) 160/99 (!) 169/93  Pulse: 89 84 85 81  Resp: 18  17 18   Temp: 98.4 F (36.9 C) 98.4 F (36.9 C) 98.5 F (36.9 C) 98 F (36.7 C)  TempSrc: Oral Oral Oral Oral  SpO2: 100% 98% 97% 98%  Weight:      Height:        Intake/Output Summary (Last 24 hours) at 11/06/2024 0838 Last data filed at 11/06/2024 0546 Gross per 24 hour  Intake 360 ml  Output 800 ml  Net -440 ml      11/01/2024    5:00 AM 10/30/2024    9:14 PM 04/21/2024    7:00 PM  Last 3 Weights  Weight (lbs) 168 lb 14 oz 220 lb 0.3 oz   Weight (kg) 76.6 kg 99.8 kg      Information is confidential and restricted. Go to Review Flowsheets to unlock data.      Telemetry/ECG  NSR- Personally Reviewed  Physical Exam  GEN: Well nourished, well developed in no acute distress HEENT: Normal NECK: No JVD; No carotid bruits LYMPHATICS: No lymphadenopathy CARDIAC:RRR, no murmurs, rubs, gallops RESPIRATORY:  Clear to auscultation without rales, wheezing or rhonchi  ABDOMEN: Soft, non-tender, non-distended MUSCULOSKELETAL:  No edema; No deformity  SKIN: Warm and dry NEUROLOGIC:  Alert and oriented x 3 PSYCHIATRIC:  Normal affect   Patient Profile Patient with past medical history significant for prior CVA 2016, hypertension, PAD, CKD, type 2 diabetes, OSA.  Patient has been out of his medications for approximately 6 weeks.  He is wheelchair dependent and reliant upon his daughter to give him his medications and she was not doing so.  Assessment & Plan   New HFrEF -06/2023 EF 50 to 55% with moderate asymmetric LVH with wall motion abnormalities  -Echo 10/31/2024:newly reduced EF 30%  with moderate asymmetric LVH.  RV was normal.  Mild MR.  He has wall motion abnormalities lateral, posterior and inferior wall which are akinetic.  Anterolateral wall and inferior segment hypokinetic.  Similar to previous echo. Etiology of this may be related to his hypertensive emergency.  He has had no chest pain but ischemic disease is certainly on the differential.   -appears euvolemic on exam and has not needed any diuretics  -With his AKI on CKD and lack of symptoms would not pursue ischemic testing during this admission.  Would treat underlying hypertension and repeat echocardiogram outpatient after starting GDMT.  -GDMT limited by CKD: Entresto  was stopped due to AKI.  No MRA due to AKI.  Continue carvedilol  25 mg twice daily.  Increase BiDil  to 20-37.5 mg 2 tablets twice daily due to ongoing hypertension  Hypertensive emergency -Blood pressure around 210 systolics on arrival.   -Transitioned to carvedilol  25 mg twice daily yesterday -continue Carvedilol  25mg  BID and Amlodipine  10mg  daily -Remains hypertensive on exam today at 169/93 mmHg -Will increase BiDil  to 20-37.5 mg 2 tablets twice daily  Elevated troponins -Mildly elevated with flat trend 197->>181.  -Downtrending troponins not indicative of ACS.   -Likely related to hypertensive emergency. -Repeat 2D echo on maximum tolerated GDMT and if EF has not improved consider ischemic workup outpatient  AKI on  CKD -Creatinine bumped after 1 dose of Entresto  and has been stopped  -Suspect related to hypertensive emergency and diabetes  -Nephrology has signed off and will see patient in the outpatient setting   PAD Hyperlipidemia Follows with vascular with Moderate PAD with preserved toe pressure.  Not felt to be a revascularization candidate and he is no longer ambulating and wheelchair dependent.   Appears he has been on DAPT therapy with aspirin  and Plavix  for this.  Will defer to them about long-term DAPT. Continue statin therapy  with atorvastatin  80 mg daily LDL goal less than 70 LDL this admission 206 with HDL 38 and triglycerides 213>> he had not been taking his atorvastatin  at home Restarted atorvastatin  80 mg daily and will need FLP and ALT in 6 weeks.  If LDL is not improved then we will need to refer to lipid clinic for PCSK9 I  [facility] will sign off.   Medication Recommendations: Atorvastatin  80mg  daily, ASA 81mg  daily, Plavix  75mg  daily, Amlodipine  10 mg daily, carvedilol  25 mg twice daily, BiDil  20-37.5 mg 2 tablets 3 times daily  Followup labs:FLP and ALT in 6 weeks Follow up as an outpatient: Extender or Dr. Anner in 2 weeks  I spent 35 minutes caring for this patient today face to face, ordering and reviewing labs, reviewing records from 2D echo 10/31/2024, seeing the patient, documenting in the record, and arranging for outpt followup   For questions or updates, please contact [facility] Please consult [email or web address] for contact info under       Signed, Wilbert Bihari, MD, [facility] 11/06/3034

## 2024-11-06 NOTE — Discharge Summary (Signed)
 Physician Discharge Summary  Taylor Obrien FMW:[id number] DOB: [birth year] DOA: 10/30/2024  PCP: Trudy Ave, NP  Admit date: 10/30/2024 Discharge date: 11/06/2024  Admitted From: Home  Discharge disposition: Home   Recommendations for Outpatient Follow-Up:   Follow up with your primary care provider in one week.  Check CBC, BMP, magnesium  in the next visit Follow-up with nephrology at [facility] in 2 weeks with [facility] which has been scheduled. Follow-up with cardiology as outpatient as scheduled by the clinic.   Discharge Diagnosis:   Principal Problem:   Hypertensive emergency Active Problems:   AKI (acute kidney injury)   Acute combined systolic and diastolic CHF, NYHA class 3 (HCC)   Discharge Condition: Improved.  Diet recommendation: Low sodium, heart healthy.  Carbohydrate-modified.    Wound care: None.  Code status: Full.   History of Present Illness:   Patient is a 58 year old male with significant past medical history of hypertension, PVD, CVA, CKD stage III, hyperlipidemia, DM2, alcohol abuse, history of alcoholic pancreatitis, major depressive disorder, Suicidal Ideation, PAD and GERD who initially presented to [facility] with shortness of breath, leg swelling and chest tightness.  Patient reported that he was out of his antihypertensive medications for at least 3 weeks and had orthopnea and shortness of breath.  In the ED, systolic blood pressure was more than 200 with diastolic in the 140s.  Patient was given hydralazine  10 mg, Lasix  80 mg IV and was started Obrien nitroglycerin  infusion for hypertensive emergency and CHF exacerbation.  Cardiology was also consulted.  Initial labs were notable for sodium of 143 with glucose of 116 and creatinine of 3.6.  ALT  of 16 and AST of 35 with albumin low at 3.1 and ALP at 70.  proBNP elevated at 6550.  Troponins were elevated at 197 followed by 181.  No leukocytosis was noted.  Chest x-ray  showed pulmonary vascular congestion.  EKG showed sinus tachycardia with LVH.  Flu, COVID, RSV PCR was negative.  Review of previous 2D echocardiogram from 06/19/2023 showed a EF of 50 to 55%.  Patient was initially admitted to ICU for hypertensive emergency with AKI. Subsequently was transferred out of ICU.    Hospital Course:   Following conditions were addressed during hospitalization as listed below,  Hypertensive emergency Acute Obrien chronic CHF exacerbation Elevated proBNP Elevated troponin Dyspnea Review of 2D echocardiogram from  06/19/23-EF 50 to 55%, LV low normal function, LV demonstrates regional wall abnormalities, grade 1 diastolic dysfunction, RV normal, RV systolic function normal, LA mild to moderate dilation, trivial mitral valve regurg.  Initially received IV Lasix  and was Obrien nitroglycerin  drip but has been transitioned to oral medications.  Patient is currently Obrien BiDil , Coreg , amlodipine .  Continue aspirin , Lipitor , Plavix .  Cardiology followed the patient during hospitalization.  Patient will be continued Obrien BiDil  Coreg  and amlodipine  Obrien discharge.  Doses of antihypertensives have been adjusted during hospitalization.   Hypertension, hyperlipidemia, CVA, peripheral vascular disease. Continue statins.  Not a vascular surgery candidate.  No pulses palpable.  Continue aspirin , Plavix .  Was seen by vascular surgery as outpatient 07/01/2024.  No longer ambulating and no plans for aggressive intervention..   AKI Obrien CKD stage IIIb Secondary to hypertensive emergency.  Baseline creatinine is between 1.9-2.0 over the last year.  Creatinine today at 4.9 from 4.7 < 5.1.  Nephrology following.  Patient is negative balance for 5608 mL so far.   Diabetes mellitus type 2 Continue sliding scale insulin .  Hemoglobin A1c of  5.8.  POC glucose of 96   EtOH abuse/Tobacco use  History of alcoholic pancreatitis LFTs normal, continue CIWA protocol.  No signs of active withdrawal.  Tolerating oral  diet well   Manic depressive disorder Suicidal Attempt 04/21/24 No ongoing suicidal ideation.  Continue Lexapro  from home   GERD Continue Protonix     Constipation.  Continue Senokot, MiraLAX    Deconditioning debility.  Seen by physical therapy and recommend skilled nursing facility placement.  But at this time patient wishes to go home.  Disposition.  At this time, patient is stable for disposition home with outpatient cardiology, nephrology and PCP follow-up  Medical Consultants:   Cardiology Nephrology  Procedures:    None Subjective:   Today, patient was seen and examined after wants to go home.  Denies any shortness of breath dyspnea chest pain or palpitation  Discharge Exam:   Vitals:   11/05/24 1943 11/06/24 0421  BP: (!) 160/99 (!) 169/93  Pulse: 85 81  Resp: 17 18  Temp: 98.5 F (36.9 C) 98 F (36.7 C)  SpO2: 97% 98%   Vitals:   11/05/24 0733 11/05/24 1623 11/05/24 1943 11/06/24 0421  BP: (!) 180/93 (!) 165/93 (!) 160/99 (!) 169/93  Pulse: 89 84 85 81  Resp: 18  17 18   Temp: 98.4 F (36.9 C) 98.4 F (36.9 C) 98.5 F (36.9 C) 98 F (36.7 C)  TempSrc: Oral Oral Oral Oral  SpO2: 100% 98% 97% 98%  Weight:      Height:       Body mass index is 27.26 kg/m.   General: Alert awake, not in obvious distress HENT: pupils equally reacting to light,  No scleral pallor or icterus noted. Oral mucosa is moist.  Chest:  Clear breath sounds. No crackles or wheezes.  CVS: S1 &S2 heard. No murmur.  Regular rate and rhythm. Abdomen: Soft, nontender, nondistended.  Bowel sounds are heard.   Extremities: No cyanosis, clubbing with bilateral lower extremity trace edema.  Peripheral pulses are palpable. Psych: Alert, awake and oriented, normal mood CNS:  No cranial nerve deficits.  Mild left-sided weakness Skin: Warm and dry.  No rashes noted.  The results of significant diagnostics from this hospitalization (including imaging, microbiology, ancillary and laboratory)  are listed below for reference.     Diagnostic Studies:   US  RENAL Result Date: 10/31/2024 EXAM: US  Retroperitoneum Complete, Renal. 10/31/2024 10:51:00 AM TECHNIQUE: Real-time ultrasonography of the retroperitoneum renal was performed. COMPARISON: None available CLINICAL HISTORY: [id number] AKI (acute kidney injury) [id number] AKI (acute kidney injury) FINDINGS: FINDINGS: RIGHT KIDNEY/URETER: The right kidney measures 10.2 x 5.8 x 4.3 cm with a volume of 131.9 cc. Increased parenchymal echogenicity. No hydronephrosis. No calculus. No mass. LEFT KIDNEY/URETER: The left kidney measures 10.6 x 6.0 x 4.1 cm with a volume of 137.6 cc. Increased parenchymal echogenicity. No hydronephrosis. No calculus. No mass. BLADDER: The bladder appears normal with visualization of both ureteral jets. IMPRESSION: 1. Increased bilateral renal parenchymal echogenicity, compatible with medical renal disease 2. No signs of hydronephrosis. Electronically signed by: Waddell Calk MD 10/31/2024 11:20 AM EST RP Workstation: [id number]   ECHOCARDIOGRAM COMPLETE Result Date: 10/31/2024    ECHOCARDIOGRAM REPORT   Patient Name:   Taylor Obrien Date of Exam: 10/31/2024 Medical Rec #:  [id number]     Height:       66.0 in Accession #:    [id number]    Weight:       220.0 lb Date of Birth:  March 13, 1966  BSA:          2.083 m Patient Age:    58 years      BP:           154/109 mmHg Patient Gender: M             HR:           104 bpm. Exam Location:  Inpatient Procedure: 2D Echo, Cardiac Doppler and Color Doppler (Both Spectral and Color            Flow Doppler were utilized during procedure). STAT ECHO Indications:    I50.31 Acute diastolic (congestive) heart failure  History:        Patient has prior history of Echocardiogram examinations, most                 recent 06/19/2023. Risk Factors:Hypertension, Dyslipidemia, ETOH,                 Diabetes and Sleep Apnea.  Sonographer:    Damien Senior RDCS Referring Phys: [id number] FONDA JAYSON SHARPS  IMPRESSIONS  1. Left ventricular ejection fraction, by estimation, is 30%. Left ventricular ejection fraction by 2D MOD biplane is 29.1 %. The left ventricle has moderately decreased function. The left ventricle demonstrates regional wall motion abnormalities (see scoring diagram/findings for description). There is moderate asymmetric left ventricular hypertrophy of the septal segment. Indeterminate diastolic filling due to E-A fusion.  2. Right ventricular systolic function is normal. The right ventricular size is normal. Tricuspid regurgitation signal is inadequate for assessing PA pressure.  3. The mitral valve is degenerative. Mild mitral valve regurgitation. No evidence of mitral stenosis.  4. The aortic valve was not well visualized. Aortic valve regurgitation is not visualized. No aortic stenosis is present.  5. The inferior vena cava is normal in size with greater than 50% respiratory variability, suggesting right atrial pressure of 3 mmHg. FINDINGS  Left Ventricle: Left ventricular ejection fraction, by estimation, is 30%. Left ventricular ejection fraction by 2D MOD biplane is 29.1 %. The left ventricle has moderately decreased function. The left ventricle demonstrates regional wall motion abnormalities. The left ventricular internal cavity size was normal in size. There is moderate asymmetric left ventricular hypertrophy of the septal segment. Indeterminate diastolic filling due to E-A fusion.  LV Wall Scoring: The mid and distal lateral wall, posterior wall, and basal inferior segment are akinetic. The antero-lateral wall and mid inferior segment are hypokinetic. Right Ventricle: The right ventricular size is normal. Right vetricular wall thickness was not well visualized. Right ventricular systolic function is normal. Tricuspid regurgitation signal is inadequate for assessing PA pressure. Left Atrium: Left atrial size was normal in size. Right Atrium: Right atrial size was normal in size. Pericardium:  There is no evidence of pericardial effusion. Mitral Valve: The mitral valve is degenerative in appearance. Mild mitral valve regurgitation. No evidence of mitral valve stenosis. Tricuspid Valve: The tricuspid valve is normal in structure. Tricuspid valve regurgitation is trivial. No evidence of tricuspid stenosis. Aortic Valve: The aortic valve was not well visualized. Aortic valve regurgitation is not visualized. No aortic stenosis is present. Pulmonic Valve: The pulmonic valve was not well visualized. Pulmonic valve regurgitation is not visualized. No evidence of pulmonic stenosis. Aorta: The aortic root is normal in size and structure and the ascending aorta was not well visualized. Venous: The inferior vena cava is normal in size with greater than 50% respiratory variability, suggesting right atrial pressure of 3 mmHg. IAS/Shunts: No atrial level shunt  detected by color flow Doppler.  LEFT VENTRICLE PLAX 2D                        Biplane EF (MOD) LVIDd:         4.90 cm         LV Biplane EF:   Left LVIDs:         4.50 cm                          ventricular LV PW:         1.10 cm                          ejection LV IVS:        1.40 cm                          fraction by LVOT diam:     1.97 cm                          2D MOD LV SV:         36                               biplane is LV SV Index:   17                               29.1 %. LVOT Area:     3.05 cm  LV Volumes (MOD) LV vol d, MOD    128.0 ml A2C: LV vol d, MOD    122.0 ml A4C: LV vol s, MOD    85.0 ml A2C: LV vol s, MOD    93.9 ml A4C: LV SV MOD A2C:   43.0 ml LV SV MOD A4C:   122.0 ml LV SV MOD BP:    37.3 ml RIGHT VENTRICLE RV S prime:     14.60 cm/s TAPSE (M-mode): 1.7 cm LEFT ATRIUM             Index        RIGHT ATRIUM           Index LA diam:        4.63 cm 2.22 cm/m   RA Area:     14.30 cm LA Vol (A2C):   53.4 ml 25.64 ml/m  RA Volume:   32.40 ml  15.56 ml/m LA Vol (A4C):   72.3 ml 34.72 ml/m LA Biplane Vol: 65.0 ml 31.21 ml/m  AORTIC  VALVE LVOT Vmax:   101.00 cm/s LVOT Vmean:  66.400 cm/s LVOT VTI:    0.119 m  AORTA Ao Root diam: 3.14 cm  SHUNTS Systemic VTI:  0.12 m Systemic Diam: 1.97 cm Soyla Merck MD Electronically signed by Soyla Merck MD Signature Date/Time: 10/31/2024/9:05:11 AM    Final    DG Chest Port 1 View Result Date: 10/30/2024 EXAM: 1 VIEW(S) XRAY OF THE CHEST 10/30/2024 09:53:49 PM COMPARISON: Comparison study 01/29/2024. CLINICAL HISTORY: Peripheral edema and shortness of breath. FINDINGS: LUNGS AND PLEURA: Increased vascular congestion is noted with interstitial edema. No infiltrate is noted. No sizable effusion is seen. HEART AND MEDIASTINUM: Cardiac shadow is enlarged. Aortic calcifications are seen. BONES AND SOFT  TISSUES: No bony abnormality is seen. IMPRESSION: 1. Increased vascular congestion with interstitial edema. No sizable effusion or infiltrate. Electronically signed by: Oneil Devonshire MD 10/30/2024 09:56 PM EST RP Workstation: [id number]     Labs:   Basic Metabolic Panel: Recent Labs  Lab 10/31/24 0258 11/01/24 0440 11/02/24 0413 11/03/24 0247 11/04/24 0404 11/05/24 0303 11/06/24 0450  NA 143 138 139 139 139 137 138  K 3.8 4.3 3.6 3.9 4.7 4.4 4.9  CL 109 106 107 106 107 106 106  CO2 24 22 24 22 23 22  21*  GLUCOSE 120* 117* 147* 105* 111* 104* 111*  BUN 24* 33* 40* 45* 47* 51* 59*  CREATININE 3.86* 4.88* 5.24* 5.10* 4.79* 4.78* 4.98*  CALCIUM  8.2* 8.1* 7.9* 7.8* 8.2* 8.1* 8.7*  MG 1.7 2.3 2.1  --  2.1  --   --   PHOS 3.9  --   --  3.2 3.8 3.5 4.1   GFR Estimated Creatinine Clearance: 16.1 mL/min (A) (by C-G formula based Obrien SCr of 4.98 mg/dL (H)). Liver Function Tests: Recent Labs  Lab 10/30/24 2210 11/03/24 0247 11/04/24 0404 11/05/24 0303 11/06/24 0450  AST 35  --   --   --   --   ALT 16  --   --   --   --   ALKPHOS 70  --   --   --   --   BILITOT 0.2  --   --   --   --   PROT 6.3*  --   --   --   --   ALBUMIN 3.1* 1.9* 2.1* 2.1* 2.5*   Recent Labs  Lab  10/31/24 0257  LIPASE 23   No results for input(s): AMMONIA in the last 168 hours. Coagulation profile No results for input(s): INR, PROTIME in the last 168 hours.  CBC: Recent Labs  Lab 10/30/24 2210 10/31/24 0257 11/01/24 0440 11/02/24 0413 11/04/24 0404  WBC 7.2 10.3 7.7 6.6 6.5  NEUTROABS 5.3  --   --   --   --   HGB 14.1 12.6* 10.5* 10.6* 11.0*  HCT 43.3 38.0* 31.8* 32.0* 33.2*  MCV 97.7 96.9 96.1 96.4 96.5  PLT 249 232 183 186 210   Cardiac Enzymes: No results for input(s): CKTOTAL, CKMB, CKMBINDEX, TROPONINI in the last 168 hours. BNP: Invalid input(s): POCBNP CBG: Recent Labs  Lab 11/04/24 2121 11/05/24 0732 11/05/24 1105 11/05/24 1621 11/05/24 2134  GLUCAP 86 96 122* 116* 141*   D-Dimer No results for input(s): DDIMER in the last 72 hours. Hgb A1c No results for input(s): HGBA1C in the last 72 hours. Lipid Profile No results for input(s): CHOL, HDL, LDLCALC, TRIG, CHOLHDL, LDLDIRECT in the last 72 hours. Thyroid function studies No results for input(s): TSH, T4TOTAL, T3FREE, THYROIDAB in the last 72 hours.  Invalid input(s): FREET3 Anemia work up No results for input(s): VITAMINB12, FOLATE, FERRITIN, TIBC, IRON, RETICCTPCT in the last 72 hours. Microbiology Recent Results (from the past 240 hours)  Resp panel by RT-PCR (RSV, Flu A&B, Covid) Anterior Nasal Swab     Status: None   Collection Time: 10/30/24  9:36 PM   Specimen: Anterior Nasal Swab  Result Value Ref Range Status   SARS Coronavirus 2 by RT PCR NEGATIVE NEGATIVE Final    Comment: (NOTE) SARS-CoV-2 target nucleic acids are NOT DETECTED.  The SARS-CoV-2 RNA is generally detectable in upper respiratory specimens during the acute phase of infection. The lowest concentration of SARS-CoV-2 viral copies this assay can  detect is 138 copies/mL. A negative result does not preclude SARS-Cov-2 infection and should not be used as the sole basis  for treatment or other patient management decisions. A negative result may occur with  improper specimen collection/handling, submission of specimen other than nasopharyngeal swab, presence of viral mutation(s) within the areas targeted by this assay, and inadequate number of viral copies(<138 copies/mL). A negative result must be combined with clinical observations, patient history, and epidemiological information. The expected result is Negative.  Fact Sheet for Patients:  bloggercourse.com  Fact Sheet for Healthcare Providers:  seriousbroker.it  This test is no t yet approved or cleared by the [place]  FDA and  has been authorized for detection and/or diagnosis of SARS-CoV-2 by FDA under an Emergency Use Authorization (EUA). This EUA will remain  in effect (meaning this test can be used) for the duration of the COVID-19 declaration under Section 564(b)(1) of the Act, 21 U.S.C.section 360bbb-3(b)(1), unless the authorization is terminated  or revoked sooner.       Influenza A by PCR NEGATIVE NEGATIVE Final   Influenza B by PCR NEGATIVE NEGATIVE Final    Comment: (NOTE) The Xpert Xpress SARS-CoV-2/FLU/RSV plus assay is intended as an aid in the diagnosis of influenza from Nasopharyngeal swab specimens and should not be used as a sole basis for treatment. Nasal washings and aspirates are unacceptable for Xpert Xpress SARS-CoV-2/FLU/RSV testing.  Fact Sheet for Patients: bloggercourse.com  Fact Sheet for Healthcare Providers: seriousbroker.it  This test is not yet approved or cleared by the [place]  FDA and has been authorized for detection and/or diagnosis of SARS-CoV-2 by FDA under an Emergency Use Authorization (EUA). This EUA will remain in effect (meaning this test can be used) for the duration of the COVID-19 declaration under Section 564(b)(1) of the Act, 21  U.S.C. section 360bbb-3(b)(1), unless the authorization is terminated or revoked.     Resp Syncytial Virus by PCR NEGATIVE NEGATIVE Final    Comment: (NOTE) Fact Sheet for Patients: bloggercourse.com  Fact Sheet for Healthcare Providers: seriousbroker.it  This test is not yet approved or cleared by the [place]  FDA and has been authorized for detection and/or diagnosis of SARS-CoV-2 by FDA under an Emergency Use Authorization (EUA). This EUA will remain in effect (meaning this test can be used) for the duration of the COVID-19 declaration under Section 564(b)(1) of the Act, 21 U.S.C. section 360bbb-3(b)(1), unless the authorization is terminated or revoked.  Performed at [facility], [address] [place], [place] [address]   MRSA Next Gen by PCR, Nasal     Status: None   Collection Time: 10/31/24  2:41 AM   Specimen: Nasal Mucosa; Nasal Swab  Result Value Ref Range Status   MRSA by PCR Next Gen NOT DETECTED NOT DETECTED Final    Comment: (NOTE) The GeneXpert MRSA Assay (FDA approved for NASAL specimens only), is one component of a comprehensive MRSA colonization surveillance program. It is not intended to diagnose MRSA infection nor to guide or monitor treatment for MRSA infections. Test performance is not FDA approved in patients less than 77 years old. Performed at [facility], [address] [place], [place] [address]      Discharge Instructions:   Discharge Instructions     Diet - low sodium heart healthy   Complete by: As directed    Diet Carb Modified   Complete by: As directed    Discharge instructions   Complete by: As directed    Follow-up with  your primary care provider in 1 week.  Check blood work at that time.  Follow-up with nephrology in [place] in 2 weeks as has been scheduled for monitoring of kidney function.  Take medications as prescribed especially the blood presssure medications.   Seek medical attention for worsening symptoms.   Increase activity slowly   Complete by: As directed       Allergies as of 11/06/2024       Reactions   Ibuprofen  Other (See Comments)   Avoid per PCP   Oxcarbazepine Other (See Comments)   Patient goes out of right state of mind.         Medication List     STOP taking these medications    cloNIDine  0.3 mg/24hr patch Commonly known as: CATAPRES  - Dosed in mg/24 hr   gabapentin  100 MG capsule Commonly known as: NEURONTIN    gabapentin  300 MG capsule Commonly known as: NEURONTIN    hydrALAZINE  25 MG tablet Commonly known as: APRESOLINE    labetalol  300 MG tablet Commonly known as: NORMODYNE        TAKE these medications    amLODipine  10 MG tablet Commonly known as: NORVASC  Take 1 tablet (10 mg total) by mouth daily.   aspirin  81 MG chewable tablet Chew 1 tablet (81 mg total) by mouth daily.   atorvastatin  80 MG tablet Commonly known as: LIPITOR  Take 1 tablet (80 mg total) by mouth daily.   calcitRIOL  0.25 MCG capsule Commonly known as: ROCALTROL  Take 1 capsule (0.25 mcg total) by mouth every Monday, Wednesday, and Friday. Start taking Obrien: November 08, 2024   carvedilol  25 MG tablet Commonly known as: COREG  Take 1 tablet (25 mg total) by mouth 2 (two) times daily with a meal.   clopidogrel  75 MG tablet Commonly known as: PLAVIX  Take 1 tablet (75 mg total) by mouth daily.   escitalopram  20 MG tablet Commonly known as: LEXAPRO  Take 20 mg by mouth daily.   fenofibrate  145 MG tablet Commonly known as: TRICOR  Take 145 mg by mouth daily.   isosorbide -hydrALAZINE  20-37.5 MG tablet Commonly known as: BIDIL  Take 2 tablets by mouth 3 (three) times daily.   lidocaine  5 % Commonly known as: LIDODERM  Place 2 patches onto the skin daily. Remove & Discard patch within 12 hours or as directed by MD What changed:  when to take this reasons to take this   melatonin 5 MG Tabs Take 0.5 tablets (2.5 mg total)  by mouth at bedtime.   nicotine  21 mg/24hr patch Commonly known as: NICODERM CQ  - dosed in mg/24 hours Place 1 patch (21 mg total) onto the skin daily at 6 (six) AM. What changed:  when to take this reasons to take this   pantoprazole  40 MG tablet Commonly known as: PROTONIX  Take 1 tablet (40 mg total) by mouth at bedtime.   Vascepa  1 g capsule Generic drug: icosapent  Ethyl Take 2 g by mouth 2 (two) times daily.   Vitamin D  (Ergocalciferol ) 1.25 MG (50000 UNIT) Caps capsule Commonly known as: DRISDOL  Take 1 capsule (50,000 Units total) by mouth every 7 (seven) days. Start taking Obrien: November 10, 2024        Follow-up Information     Trudy Ave, NP Follow up in 1 week(s).   Specialty: Nurse Practitioner Contact information: [address] [place] [address] [phone]                  Time coordinating discharge: 39 minutes  Signed:  Braeson Rupe  [name] 11/06/2024, 9:37 AM

## 2024-11-06 NOTE — TOC Progression Note (Addendum)
 Transition of Care [facility]) - Progression Note    Patient Details  Name: Taylor Obrien MRN: [id number] Date of Birth: Aug 07, 1966  [facility] CM/SW Contact  Robynn Eileen Hoose, RN Phone Number: 11/06/2024, 9:01 AM  Clinical Narrative:   Secure message received regarding patient wanting to go home and needing home health services.  [facility] referrals that have declined:  Hedda Gaba, and [facility]. Referral sent to Angie with [facility], awaiting response.  [id number] Sniff with [facility] declined due to inadequate staffing. Provider and patient updated. Patient reports that all his belonging that he came to the hospital with is gone, including clothing, EBT Card, Debit Card, [employer], Gaffer.    Expected Discharge Plan: Home/Self Care Barriers to Discharge: Continued Medical Work up, Family Issues               Expected Discharge Plan and Services In-house Referral: Clinical Social Work     Living arrangements for the past 2 months: Single Family Home                                       Social Drivers of Health (SDOH) Interventions SDOH Screenings   Food Insecurity: Food Insecurity Present (10/31/2024)  Housing: Low Risk  (10/31/2024)  Transportation Needs: No Transportation Needs (10/31/2024)  Utilities: Not At Risk (10/31/2024)  Alcohol Screen: High Risk (04/21/2024)  Tobacco Use: High Risk (10/30/2024)    Readmission Risk Interventions    04/26/2022   11:38 AM 04/25/2022    9:38 AM  Readmission Risk Prevention Plan  Transportation Screening Complete Complete  Home Care Screening  Complete  Medication Review (RN CM)  Complete  HRI or Home Care Consult Complete   Social Work Consult for Recovery Care Planning/Counseling Complete   Palliative Care Screening Not Applicable   Medication Review Oceanographer) Complete

## 2024-11-06 NOTE — Progress Notes (Signed)
 Reviewed AVS, patient is slightly confused and states he will not remember, called daughter Mickiel to discuss discharge instructions.  Patient states all belongings brought to the hospital at time of admission are missing and unable to locate.  Patient informed and expressed understanding where to pick up discharge medications.  Nursing staff contacted to transport patient to Discharge lounge to wait for transportation home.

## 2024-11-06 NOTE — Progress Notes (Signed)
 Patient is refusing care and medication. Pt stated that he wants to get out of here [facility]). Pt is frustrated because his belongings were lost in the ED when he was transferred here from [facility] ED. MD notified

## 2024-11-15 ENCOUNTER — Other Ambulatory Visit: Payer: Self-pay

## 2024-11-15 ENCOUNTER — Emergency Department (HOSPITAL_COMMUNITY)

## 2024-11-15 ENCOUNTER — Emergency Department (HOSPITAL_COMMUNITY)
Admission: EM | Admit: 2024-11-15 | Discharge: 2024-11-15 | Disposition: A | Discharge status: Home / Self Care | Attending: Emergency Medicine | Admitting: Emergency Medicine

## 2024-11-15 ENCOUNTER — Encounter (HOSPITAL_COMMUNITY): Payer: Self-pay | Admitting: Emergency Medicine

## 2024-11-15 DIAGNOSIS — W19XXXA Unspecified fall, initial encounter: Secondary | ICD-10-CM

## 2024-11-15 DIAGNOSIS — R55 Syncope and collapse: Secondary | ICD-10-CM

## 2024-11-15 LAB — URINALYSIS, ROUTINE W REFLEX MICROSCOPIC
Bilirubin Urine: NEGATIVE
Glucose, UA: NEGATIVE mg/dL
Ketones, ur: NEGATIVE mg/dL
Nitrite: NEGATIVE
Protein, ur: >=300 mg/dL — AB
Specific Gravity, Urine: 1.017 (ref 1.005–1.030)
pH: 5 (ref 5.0–8.0)

## 2024-11-15 LAB — CBC
HCT: 36.8 % — ABNORMAL LOW (ref 39.0–52.0)
Hemoglobin: 12.2 g/dL — ABNORMAL LOW (ref 13.0–17.0)
MCH: 32.6 pg (ref 26.0–34.0)
MCHC: 33.2 g/dL (ref 30.0–36.0)
MCV: 98.4 fL (ref 80.0–100.0)
Platelets: 270 K/uL (ref 150–400)
RBC: 3.74 MIL/uL — ABNORMAL LOW (ref 4.22–5.81)
RDW: 14.1 % (ref 11.5–15.5)
WBC: 7.1 K/uL (ref 4.0–10.5)
nRBC: 0 % (ref 0.0–0.2)

## 2024-11-15 LAB — TROPONIN T, HIGH SENSITIVITY
Troponin T High Sensitivity: 366 ng/L (ref 0–19)
Troponin T High Sensitivity: 409 ng/L (ref 0–19)

## 2024-11-15 LAB — URINE DRUG SCREEN
Amphetamines: NEGATIVE
Barbiturates: NEGATIVE
Benzodiazepines: NEGATIVE
Cocaine: NEGATIVE
Fentanyl: NEGATIVE
Methadone Scn, Ur: NEGATIVE
Opiates: NEGATIVE
Tetrahydrocannabinol: POSITIVE — AB

## 2024-11-15 LAB — COMPREHENSIVE METABOLIC PANEL WITH GFR
ALT: 18 U/L (ref 0–44)
AST: 30 U/L (ref 15–41)
Albumin: 3.3 g/dL — ABNORMAL LOW (ref 3.5–5.0)
Alkaline Phosphatase: 67 U/L (ref 38–126)
Anion gap: 14 (ref 5–15)
BUN: 32 mg/dL — ABNORMAL HIGH (ref 6–20)
CO2: 19 mmol/L — ABNORMAL LOW (ref 22–32)
Calcium: 8.5 mg/dL — ABNORMAL LOW (ref 8.9–10.3)
Chloride: 105 mmol/L (ref 98–111)
Creatinine, Ser: 3.81 mg/dL — ABNORMAL HIGH (ref 0.61–1.24)
GFR, Estimated: 18 mL/min — ABNORMAL LOW (ref >60)
Glucose, Bld: 152 mg/dL — ABNORMAL HIGH (ref 70–99)
Potassium: 4.4 mmol/L (ref 3.5–5.1)
Sodium: 138 mmol/L (ref 135–145)
Total Bilirubin: <0.2 mg/dL (ref 0.0–1.2)
Total Protein: 6.6 g/dL (ref 6.5–8.1)

## 2024-11-15 LAB — ETHANOL: Alcohol, Ethyl (B): <15 mg/dL (ref <15)

## 2024-11-15 LAB — MAGNESIUM: Magnesium: 1.9 mg/dL (ref 1.7–2.4)

## 2024-11-15 MED ORDER — METHOCARBAMOL 500 MG PO TABS
500.0000 mg | ORAL_TABLET | Freq: Once | ORAL | Status: AC
  Administered 2024-11-15: 500 mg via ORAL
  Filled 2024-11-15: qty 1

## 2024-11-15 MED ORDER — OXYCODONE-ACETAMINOPHEN 5-325 MG PO TABS
1.0000 | ORAL_TABLET | Freq: Once | ORAL | Status: AC
  Administered 2024-11-15: 1 via ORAL
  Filled 2024-11-15: qty 1

## 2024-11-15 MED ORDER — ISOSORBIDE DINITRATE 20 MG PO TABS
40.0000 mg | ORAL_TABLET | Freq: Three times a day (TID) | ORAL | Status: DC
  Filled 2024-11-15 (×2): qty 2

## 2024-11-15 MED ORDER — HYDRALAZINE HCL 25 MG PO TABS
75.0000 mg | ORAL_TABLET | Freq: Three times a day (TID) | ORAL | Status: DC
  Administered 2024-11-15: 75 mg via ORAL
  Filled 2024-11-15: qty 3

## 2024-11-15 MED ORDER — ASPIRIN 81 MG PO CHEW
324.0000 mg | CHEWABLE_TABLET | Freq: Once | ORAL | Status: AC
  Administered 2024-11-15: 324 mg via ORAL
  Filled 2024-11-15: qty 4

## 2024-11-15 MED ORDER — AMLODIPINE BESYLATE 5 MG PO TABS: 10.0000 mg | ORAL_TABLET | Freq: Every day | ORAL | Status: DC

## 2024-11-15 MED ORDER — METHOCARBAMOL 500 MG PO TABS: 500.0000 mg | ORAL_TABLET | Freq: Three times a day (TID) | ORAL | 0 refills | Status: DC | PRN

## 2024-11-15 MED ORDER — ISOSORB DINITRATE-HYDRALAZINE 20-37.5 MG PO TABS: 2.0000 | ORAL_TABLET | Freq: Three times a day (TID) | ORAL | Status: DC

## 2024-11-15 MED ORDER — CARVEDILOL 12.5 MG PO TABS
25.0000 mg | ORAL_TABLET | Freq: Two times a day (BID) | ORAL | Status: DC
  Administered 2024-11-15: 25 mg via ORAL
  Filled 2024-11-15: qty 2

## 2024-11-15 NOTE — ED Notes (Signed)
 Patient has removed self from monitoring equipment and has called daughter to come pick him up, thinking he's going home.  This RN has explained to him that he's not discharged nor is he ready to go, and patient still stating he's ready to go.  MD made aware.

## 2024-11-15 NOTE — ED Notes (Signed)
 Patient has called daughter to come pick him up as he is wanting to leave AMA, daughter not answering.

## 2024-11-15 NOTE — Discharge Instructions (Addendum)
 Your imaging study showed no major injuries.  Your lab work showed the concerning finding of elevated and increasing cardiac enzymes.  This in addition to your episode of being slumped over raises concern of an episode of an abnormal heart rhythm, heart attack, or other life threatening condition.  Please return at any time if you change your mind about staying for further testing and continued observation.

## 2024-11-15 NOTE — ED Notes (Signed)
 Patient attempted to urinate in urinal, missed urinal and soiled self.  [name] gown and linens changed.

## 2024-11-15 NOTE — ED Triage Notes (Signed)
 Pt states he fell out of wheelchair this am and now c/o lower back and butt pain.

## 2024-11-15 NOTE — ED Notes (Signed)
 Patient to imaging.

## 2024-11-15 NOTE — ED Notes (Signed)
Pt not able to void at this time.

## 2024-11-15 NOTE — ED Notes (Signed)
 [name] daughter, Minnette, stated that her sister was on her way to get patient.

## 2024-11-15 NOTE — ED Notes (Signed)
 Patient refused discharge vital signs.

## 2024-11-15 NOTE — ED Provider Notes (Addendum)
 [place] EMERGENCY DEPARTMENT AT [facility] Provider Note   CSN: [id number] Arrival date & time: 11/15/24  1719     Patient presents with: Back Pain   Taylor Obrien is a 58 y.o. male.    Back Pain Patient presents after a fall.  Medical history includes HTN, chronic pain, CVA, HLD, chronic pain, polysubstance abuse, OSA, anemia, CKD, GERD, depression, CHF.  He was admitted 2 weeks ago for hypertensive emergency.  He underwent changes to his blood pressure medications at the time.  He is currently on aspirin  and Plavix .  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  Earlier today, he tipped backwards in his wheelchair causing a fall.  He did strike the back of his head.  He has since had pain in mid back, lower back, and buttocks.     Prior to Admission medications   Medication Sig Start Date End Date Taking? Authorizing Provider  amLODipine  (NORVASC ) 10 MG tablet Take 1 tablet (10 mg total) by mouth daily. 01/19/16   Kirsteins, Prentice BRAVO, MD  aspirin  81 MG chewable tablet Chew 1 tablet (81 mg total) by mouth daily. 06/27/24   Daralene Lonni BIRCH, PA-C  atorvastatin  (LIPITOR ) 80 MG tablet Take 1 tablet (80 mg total) by mouth daily. 04/26/24   Jadapalle, Sree, MD  calcitRIOL  (ROCALTROL ) 0.25 MCG capsule Take 1 capsule (0.25 mcg total) by mouth every Monday, Wednesday, and Friday. 11/08/24 02/06/25  Pokhrel, Laxman, MD  carvedilol  (COREG ) 25 MG tablet Take 1 tablet (25 mg total) by mouth 2 (two) times daily with a meal. 11/06/24 02/04/25  Pokhrel, Vernal, MD  clopidogrel  (PLAVIX ) 75 MG tablet Take 1 tablet (75 mg total) by mouth daily. 06/27/24   Daralene Lonni BIRCH, PA-C  escitalopram  (LEXAPRO ) 20 MG tablet Take 20 mg by mouth daily. 07/30/22   [provider]  fenofibrate  (TRICOR ) 145 MG tablet Take 145 mg by mouth daily. 04/10/22   [provider]  isosorbide -hydrALAZINE  (BIDIL ) 20-37.5 MG tablet Take 2 tablets by mouth 3 (three) times  daily. 11/06/24 02/04/25  Pokhrel, Vernal, MD  lidocaine  (LIDODERM ) 5 % Place 2 patches onto the skin daily. Remove & Discard patch within 12 hours or as directed by MD Patient taking differently: Place 2 patches onto the skin daily as needed (Pain). Remove & Discard patch within 12 hours or as directed by MD 04/26/24   Jadapalle, Sree, MD  melatonin 5 MG TABS Take 0.5 tablets (2.5 mg total) by mouth at bedtime. 04/26/24   Jadapalle, Sree, MD  nicotine  (NICODERM CQ  - DOSED IN MG/24 HOURS) 21 mg/24hr patch Place 1 patch (21 mg total) onto the skin daily at 6 (six) AM. Patient taking differently: Place 21 mg onto the skin daily as needed (Cravings). 04/26/24   Jadapalle, Sree, MD  pantoprazole  (PROTONIX ) 40 MG tablet Take 1 tablet (40 mg total) by mouth at bedtime. 01/19/16   Kirsteins, Prentice BRAVO, MD  VASCEPA  1 g capsule Take 2 g by mouth 2 (two) times daily. 03/12/22   [provider]  Vitamin D , Ergocalciferol , (DRISDOL ) 1.25 MG (50000 UNIT) CAPS capsule Take 1 capsule (50,000 Units total) by mouth every 7 (seven) days. 11/10/24   Pokhrel, Laxman, MD    Allergies: Ibuprofen  and Oxcarbazepine    Review of Systems  Musculoskeletal:  Positive for back pain.  All other systems reviewed and are negative.   Updated Vital Signs BP (!) 160/104   Pulse 87   Temp 98 F (  36.7 C) (Oral)   Resp 19   Ht 5' 6 (1.676 m)   Wt 76.6 kg   SpO2 100%   BMI 27.26 kg/m   Physical Exam Vitals and nursing note reviewed.  Constitutional:      General: He is not in acute distress.    Appearance: Normal appearance. He is well-developed. He is not ill-appearing, toxic-appearing or diaphoretic.  HENT:     Head: Normocephalic and atraumatic.     Right Ear: External ear normal.     Left Ear: External ear normal.     Nose: Nose normal.  Eyes:     Extraocular Movements: Extraocular movements intact.     Conjunctiva/sclera: Conjunctivae normal.  Cardiovascular:     Rate and Rhythm: Normal rate and regular  rhythm.  Pulmonary:     Effort: Pulmonary effort is normal. No respiratory distress.  Abdominal:     General: There is no distension.     Palpations: Abdomen is soft.     Tenderness: There is no abdominal tenderness.  Musculoskeletal:        General: Tenderness present. No swelling or deformity.     Cervical back: Normal range of motion and neck supple.  Skin:    General: Skin is warm and dry.     Coloration: Skin is not jaundiced or pale.  Neurological:     Mental Status: He is alert and oriented to person, place, and time. Mental status is at baseline.  Psychiatric:        Mood and Affect: Mood normal.        Behavior: Behavior normal.     (all labs ordered are listed, but only abnormal results are displayed) Labs Reviewed  COMPREHENSIVE METABOLIC PANEL WITH GFR - Abnormal; Notable for the following components:      Result Value   CO2 19 (*)    Glucose, Bld 152 (*)    BUN 32 (*)    Creatinine, Ser 3.81 (*)    Calcium  8.5 (*)    Albumin 3.3 (*)    GFR, Estimated 18 (*)    All other components within normal limits  CBC - Abnormal; Notable for the following components:   RBC 3.74 (*)    Hemoglobin 12.2 (*)    HCT 36.8 (*)    All other components within normal limits  TROPONIN T, HIGH SENSITIVITY - Abnormal; Notable for the following components:   Troponin T High Sensitivity 366 (*)    All other components within normal limits  TROPONIN T, HIGH SENSITIVITY - Abnormal; Notable for the following components:   Troponin T High Sensitivity 409 (*)    All other components within normal limits  ETHANOL  MAGNESIUM   URINALYSIS, ROUTINE W REFLEX MICROSCOPIC  URINE DRUG SCREEN  TROPONIN T, HIGH SENSITIVITY    EKG: EKG Interpretation Date/Time:  Monday November 15 2024 17:28:39 EST Ventricular Rate:  89 PR Interval:  162 QRS Duration:  113 QT Interval:  405 QTC Calculation: 493 R Axis:   7  Text Interpretation: Sinus rhythm Right atrial enlargement LVH with IVCD and  secondary repol abnrm Inferior infarct, age indeterminate Anterior ST elevation, probably due to LVH Confirmed by Melvenia Motto [id number]) on 11/15/2024 6:48:39 PM  Radiology: CT CERVICAL SPINE WO CONTRAST Result Date: 11/15/2024 EXAM: CT CERVICAL SPINE WITHOUT CONTRAST 11/15/2024 07:22:07 PM TECHNIQUE: CT of the cervical spine was performed without the administration of intravenous contrast. Multiplanar reformatted images are provided for review. Automated exposure control, iterative reconstruction, and/or weight based  adjustment of the mA/kV was utilized to reduce the radiation dose to as low as reasonably achievable. COMPARISON: CT spine 07/06/2018. CLINICAL HISTORY: Polytrauma, blunt. 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 No acute fracture or traumatic malalignment. DEGENERATIVE CHANGES: Multilevel mild-to-moderate degenerative changes of the spine. No associated severe osseous neural foraminal or central canal stenosis. SOFT TISSUES: No prevertebral soft tissue swelling. IMPRESSION: 1. No acute abnormality of the cervical spine. 2. 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 Electronically signed by: Morgane Naveau MD 11/15/2024 07:48 PM EST RP Workstation: [id number]   CT T-SPINE NO CHARGE Result Date: 11/15/2024 EXAM: CT THORACIC SPINE WITHOUT CONTRAST 11/15/2024 07:22:07 PM TECHNIQUE: CT of the thoracic spine was performed without the administration of intravenous contrast. Multiplanar reformatted images are provided for review. Automated exposure control, iterative reconstruction, and/or weight based adjustment of the mA/kV was utilized to reduce the radiation dose to as low as reasonably achievable. COMPARISON: MRI lumbar spine 06/27/2024. CLINICAL HISTORY: FINDINGS: BONES AND ALIGNMENT: Normal vertebral body heights except for T12. Chronic stable T12 supra endplate mild  compression fracture with associated Schmorl's node. No acute fracture or suspicious bone lesion. Normal alignment. DEGENERATIVE CHANGES: Multilevel mild-to-moderate anterior osteophyte formation. SOFT TISSUES: No acute abnormality. IMPRESSION: 1. No acute findings. 2. Chronic stable T12 superior endplate mild compression fracture. 3. Please see separately dictated CT , abdomen, pelvis, lumbar spine 11/15/2024. Electronically signed by: Morgane Naveau MD 11/15/2024 07:36 PM EST RP Workstation: [id number]   CT L-SPINE NO CHARGE Result Date: 11/15/2024 EXAM: CT OF THE LUMBAR SPINE WITHOUT CONTRAST 11/15/2024 07:22:07 PM TECHNIQUE: CT of the lumbar spine was performed without the administration of intravenous contrast. Multiplanar reformatted images are provided for review. Automated exposure control, iterative reconstruction, and/or weight based adjustment of the mA/kV was utilized to reduce the radiation dose to as low as reasonably achievable. COMPARISON: MRI lumbar spine 06/27/2024. CLINICAL HISTORY: Please see separately dictated CT thoracic spine 11/15/2024. FINDINGS: BONES AND ALIGNMENT: Normal vertebral body heights. No acute fracture or suspicious bone lesion. Normal alignment. DEGENERATIVE CHANGES: Mild anterior osteophyte formation. SOFT TISSUES: No acute abnormality. IMPRESSION: 1. No acute findings. 2. Please see separately dictated CT chest, abdomen, pelvis, thoracic spine 11/15/2024. Electronically signed by: Morgane Naveau MD 11/15/2024 07:35 PM EST RP Workstation: [id number]   CT CHEST ABDOMEN PELVIS WO CONTRAST Result Date: 11/15/2024 EXAM: CT CHEST, ABDOMEN AND PELVIS WITHOUT CONTRAST 11/15/2024 07:22:07 PM TECHNIQUE: CT of the chest, abdomen and pelvis was performed without the administration of intravenous contrast. Multiplanar reformatted images are provided for review. Automated exposure control, iterative reconstruction, and/or weight based adjustment of the mA/kV was utilized to reduce  the radiation dose to as low as reasonably achievable. COMPARISON: CT angio neurobiform 06/27/2024, CT abdomen and pelvis 04/29/2024. Please see separately dictated CT thoracolumbar spine 11/15/2024. CLINICAL HISTORY: Polytrauma, blunt. FINDINGS: CHEST: MEDIASTINUM AND LYMPH NODES: Heart and pericardium are unremarkable. The central airways are clear. No mediastinal, hilar or axillary lymphadenopathy.No gross hilar adenopathy with limited evaluation on this noncontrast study. 4-vessel coronary artery calcifications. LUNGS AND PLEURA: No focal consolidation or pulmonary edema. No pleural effusion or pneumothorax. ABDOMEN AND PELVIS: LIVER: The liver is unremarkable. GALLBLADDER AND BILE DUCTS: Gallbladder is unremarkable. No biliary ductal dilatation. SPLEEN: No acute abnormality. PANCREAS: No acute abnormality. ADRENAL GLANDS: No acute abnormality. KIDNEYS, URETERS AND BLADDER: No stones in the kidneys or ureters. No hydronephrosis. No perinephric or periureteral stranding. Urinary bladder is unremarkable.  Calcifications associated with the kidneys are likely vascular. GI AND BOWEL: Stomach demonstrates no acute abnormality. There is no bowel obstruction. No small or large bowel thickening or dilatation. The appendix is unremarkable. Colonic diverticulosis. Otherwise, the colon is unremarkable. REPRODUCTIVE ORGANS: No acute abnormality. PERITONEUM AND RETROPERITONEUM: No ascites. No free air. VASCULATURE: Aorta is normal in caliber. Severe atherosclerotic plaque of the abdominal aorta. Mild atherosclerotic plaque in the thoracic aorta. ABDOMINAL AND PELVIS LYMPH NODES: No lymphadenopathy. BONES AND SOFT TISSUES: No acute displaced rib fracture. No acute displaced fracture or dislocation of either shoulder or hips. No acute displaced fracture or diastasis of the bones of the pelvis. No acute sacral fracture. No large soft tissue hematoma. Please see separately dictated CT thoracolumbar spine 12.8.25 . No large soft  tissue hematoma No focal soft tissue abnormality. IMPRESSION: 1. No acute findings related to trauma . 2. Please see separately dictated CT thoracolumbar spine 12.8.25 . 3. Other, non-acute and/or normal findings as above. Electronically signed by: Morgane Naveau MD 11/15/2024 07:34 PM EST RP Workstation: [id number]   CT HEAD WO CONTRAST Result Date: 11/15/2024 EXAM: CT HEAD WITHOUT CONTRAST 11/15/2024 07:22:07 PM TECHNIQUE: CT of the head was performed without the administration of intravenous contrast. Automated exposure control, iterative reconstruction, and/or weight based adjustment of the mA/kV was utilized to reduce the radiation dose to as low as reasonably achievable. COMPARISON: None available. CLINICAL HISTORY: Head trauma, moderate-severe FINDINGS: BRAIN AND VENTRICLES: No acute hemorrhage. No evidence of acute infarct. 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 Chronic stable lacunar infarcts in left basal ganglia and pons. Patchy and confluent supratentorial white matter hypodensities, likely representing advanced chronic microvascular ischemic changes. No hydrocephalus. No extra-axial collection. No mass effect or midline shift. Atherosclerotic calcifications are present within the cavernous internal carotid arteries. ORBITS: No acute abnormality. SINUSES: No acute abnormality. SOFT TISSUES AND SKULL: No acute soft tissue abnormality. No skull fracture. IMPRESSION: 1. No acute intracranial abnormality related to head trauma. Electronically signed by: Morgane Naveau MD 11/15/2024 07:29 PM EST RP Workstation: [id number]   DG Pelvis Portable Result Date: 11/15/2024 CLINICAL DATA:  Clemens out of wheelchair, lower back pain EXAM: PORTABLE PELVIS 1-2 VIEWS COMPARISON:  None Available. FINDINGS: Frontal view of the pelvis includes both hips. No acute displaced fracture, subluxation, or dislocation. Mild symmetrical bilateral hip osteoarthritis. Soft tissues are unremarkable.  IMPRESSION: 1. Symmetrical bilateral hip osteoarthritis. 2. No acute fracture. Electronically Signed   By: Ozell Daring M.D.   On: 11/15/2024 18:10   DG Chest Port 1 View Result Date: 11/15/2024 CLINICAL DATA:  Clemens out of wheelchair, lower back pain EXAM: PORTABLE CHEST 1 VIEW COMPARISON:  10/30/2024 FINDINGS: The heart size and mediastinal contours are within normal limits. Both lungs are clear. The visualized skeletal structures are unremarkable. IMPRESSION: No active disease. Electronically Signed   By: Ozell Daring M.D.   On: 11/15/2024 18:09     Procedures   Medications Ordered in the ED  aspirin  chewable tablet 324 mg (has no administration in time range)  amLODipine  (NORVASC ) tablet 10 mg (has no administration in time range)  carvedilol  (COREG ) tablet 25 mg (has no administration in time range)  isosorbide  dinitrate (ISORDIL ) tablet 40 mg (has no administration in time range)  hydrALAZINE  (APRESOLINE ) tablet 75 mg (has no administration in time range)  oxyCODONE -acetaminophen  (PERCOCET/ROXICET) 5-325 MG per tablet 1 tablet (1 tablet Oral Given 11/15/24 1926)  oxyCODONE -acetaminophen  (PERCOCET/ROXICET) 5-325 MG per tablet 1 tablet (1 tablet Oral Given 11/15/24  2025)  methocarbamol  (ROBAXIN ) tablet 500 mg (500 mg Oral Given 11/15/24 2025)                                    Medical Decision Making Amount and/or Complexity of Data Reviewed Labs: ordered. Radiology: ordered.  Risk OTC drugs. Prescription drug management. Decision regarding hospitalization.   This patient presents to the ED for concern of fall, this involves an extensive number of treatment options, and is a complaint that carries with it a high risk of complications and morbidity.  The differential diagnosis includes acute injuries   Co morbidities / Chronic conditions that complicate the patient evaluation  HTN, chronic pain, CVA, HLD, chronic pain, polysubstance abuse, OSA, anemia, CKD, GERD, depression,  CHF   Additional history obtained:  Additional history obtained from EMR External records from outside source obtained and reviewed including N/A   Lab Tests:  I Ordered, and personally interpreted labs.  The pertinent results include: Normal hemoglobin, no leukocytosis, creatinine slightly improved from baseline, normal electrolytes, elevated troponin   Imaging Studies ordered:  I ordered imaging studies including x-ray of chest and pelvis, CT of head, cervical spine, chest, abdomen, pelvis, T-spine, L-spine I independently visualized and interpreted imaging which showed no acute findings I agree with the radiologist interpretation   Cardiac Monitoring: / EKG:  The patient was maintained on a cardiac monitor.  I personally viewed and interpreted the cardiac monitored which showed an underlying rhythm of: Sinus rhythm   Problem List / ED Course / Critical interventions / Medication management  Patient senting after fall out of his wheelchair.  During his fall, he did strike his head and back.  He endorses pain in mid to lower back as well as buttock area.  No obvious wounds or identifiable on exam.  Patient has chronic left leg weakness from prior CVA.  He endorses chronic mild diminished strength in his left arm as well but this is difficult to appreciate on exam.  Percocet was ordered for analgesia.  Workup was initiated.  [name] family arrived and, in addition to his full, also report that he had an episode where he seemed weak and slumped over in his wheelchair.  They deny full loss of consciousness at that time.  Lab work was notable for elevated troponin.  Imaging studies did not show any acute findings.  On reassessment, patient resting on ED stretcher.  He endorses ongoing soreness.  Additional dose of Percocet was ordered.  His initial troponin was elevated above his baseline.  Repeat troponin showed further slight increase.  Dose of aspirin  was ordered.  Given his episode of  near syncope in addition to his elevated troponins, patient to be admitted for observation.  Patient was informed of his elevated and increasing troponins.  It was explained to him that this in the setting of his near syncopal episode raises concern of a possible life-threatening arrhythmia or possible MI.  Department best efforts, I was unwilling to talk patient into staying.  Patient advised to return at any time if/when he changes his mind.  Patient left AMA. I ordered medication including Percocet for analgesia, ASA for elevated troponins, home blood pressure medications for hypertension Reevaluation of the patient after these medicines showed that the patient improved I have reviewed the patients home medicines and have made adjustments as needed  Social Determinants of Health:  Wheelchair-bound at baseline     Final  diagnoses:  Fall, initial encounter  Near syncope    ED Discharge Orders          Ordered    methocarbamol  (ROBAXIN ) 500 MG tablet  Every 8 hours PRN,   Status:  Discontinued        11/15/24 2007               Melvenia Motto, MD 11/15/24 2008    Melvenia Motto, MD 11/15/24 2300    Melvenia Motto, MD 11/15/24 2307

## 2024-11-22 NOTE — Progress Notes (Unsigned)
 Cardiology Office Note    Date:  11/25/2024  ID:  Taylor Obrien, DOB 19-Sep-1966, MRN [id number] PCP:  Trudy Ave, NP  Cardiologist:  [name], MD  Electrophysiologist:  None   Chief Complaint: f/u CHF  History of Present Illness: .    Taylor Obrien is a 58 y.o. male with visit-pertinent history of prior CVA 2016, hypertension, PAD followed by vascular surgery not felt to be revascularization candidate, CKD stage IV, type 2 diabetes, OSA intolerant of CPAP, prior pancreatitis attributed to alcohol use, wheelchair dependence, recently diagnosed acute HFrEF.  Renal duplex in 2016 was normal. Monitor for stroke workup 2017 showed NSR with rare PVCs, otherwise OK. He was remotely evaluated 06/2023 for atypical chest pain and mildly elevated troponin in setting of falling out of wheelchair with AKI on CKD stage 3. Echo showed EF 50-55% at that time. He was recently admitted 10/2024 with shortness of breath in setting of being out of his medicines for several months. He was severely hypertensive with elevated troponin to 197. Echo showed EF 30%, mid and distal lateral wall, posterior wall, and basal inferior segment are akinetic, antero-lateral wall and mid inferior segment are hypokinetic, moderate asymmetric LVH of the septal segment, mild MR. Hospitalization complicated by AKI on CKD and severe HTN. It was recommended that with his AKI on CKD and lack of symptoms would not pursue ischemic testing during this admission; would treat underlying hypertension and repeat echocardiogram outpatient after starting GDMT. He had a repeat ED visit 11/2024 for fall after tipping back in wheelchair in which troponins were elevated to 409, but he left AMA. There was question of possible near syncope per ER notes but the patient vehemently denies this, states he simply tipped back.  He returns for follow-up today with his daughters. He reports continued intermittent dyspnea/orthopnea as well as episodic back  pain and spasms. He denies any chest pain. He has mild LE edema. He has not taken his medication yet today and has not been following his blood pressure. His daughter has helped fill a pill box but it's unclear if he has been taking it as prescribed. He has a new PCP appointment tomorrow.   Labwork independently reviewed: 11-11/2024 UDS + THC, trops as above, Mg 1.9, ETOH neg, Hgb 12.2, plt 270, K 4.4, alb 3.3, AST ALT OK, Cr 3.81 (4-5 range in November), LDL 206, trig 213 06/2024 CK OK  ROS: .    Please see the history of present illness.  All other systems are reviewed and otherwise negative.  Studies Reviewed: SABRA    EKG:  EKG is ordered today, personally reviewed, demonstrating:  EKG Interpretation Date/Time:  Thursday November 25 2024 08:52:02 EST Ventricular Rate:  85 PR Interval:  162 QRS Duration:  114 QT Interval:  398 QTC Calculation: 473 R Axis:   5  Text Interpretation: Sinus rhythm with occasional Premature ventricular complexes Left atrial enlargement Left ventricular hypertrophy with repolarization abnormality ( R in aVL , Cornell product ) Inferior infarct , age undetermined Similar to prior tracings Confirmed by Sidda Humm [phone]) on 11/25/2024 9:13:26 AM     CV Studies: Cardiac studies reviewed are outlined and summarized above. Otherwise please see EMR for full report.   Current Reported Medications:.    Prior to Admission medications  Medication Sig Start Date End Date Taking? Authorizing Provider  amLODipine  (NORVASC ) 10 MG tablet Take 1 tablet (10 mg total) by mouth daily. 01/19/16  Yes Kirsteins, Prentice BRAVO, MD  aspirin  81 MG chewable tablet Chew 1 tablet (81 mg total) by mouth daily. 06/27/24  Yes Daralene Bruckner D, PA-C  atorvastatin  (LIPITOR ) 80 MG tablet Take 1 tablet (80 mg total) by mouth daily. 04/26/24  Yes Jadapalle, Sree, MD  calcitRIOL  (ROCALTROL ) 0.25 MCG capsule Take 1 capsule (0.25 mcg total) by mouth every Monday, Wednesday, and Friday.  11/08/24 02/06/25 Yes Pokhrel, Laxman, MD  carvedilol  (COREG ) 25 MG tablet Take 1 tablet (25 mg total) by mouth 2 (two) times daily with a meal. 11/06/24 02/04/25 Yes Pokhrel, Laxman, MD  clopidogrel  (PLAVIX ) 75 MG tablet Take 1 tablet (75 mg total) by mouth daily. 06/27/24  Yes Daralene Bruckner D, PA-C  escitalopram  (LEXAPRO ) 20 MG tablet Take 20 mg by mouth daily. 07/30/22  Yes [provider]  fenofibrate  (TRICOR ) 145 MG tablet Take 145 mg by mouth daily. 04/10/22  Yes [provider]  isosorbide -hydrALAZINE  (BIDIL ) 20-37.5 MG tablet Take 2 tablets by mouth 3 (three) times daily. 11/06/24 02/04/25 Yes Pokhrel, Laxman, MD  lidocaine  (LIDODERM ) 5 % Place 2 patches onto the skin daily. Remove & Discard patch within 12 hours or as directed by MD Patient taking differently: Place 2 patches onto the skin daily as needed (Pain). Remove & Discard patch within 12 hours or as directed by MD 04/26/24  Yes Jadapalle, Sree, MD  melatonin 5 MG TABS Take 0.5 tablets (2.5 mg total) by mouth at bedtime. 04/26/24  Yes Jadapalle, Sree, MD  nicotine  (NICODERM CQ  - DOSED IN MG/24 HOURS) 21 mg/24hr patch Place 1 patch (21 mg total) onto the skin daily at 6 (six) AM. Patient taking differently: Place 21 mg onto the skin daily as needed (Cravings). 04/26/24  Yes Jadapalle, Sree, MD  pantoprazole  (PROTONIX ) 40 MG tablet Take 1 tablet (40 mg total) by mouth at bedtime. 01/19/16  Yes Kirsteins, Prentice BRAVO, MD  VASCEPA  1 g capsule Take 2 g by mouth 2 (two) times daily. 03/12/22  Yes [provider]  Vitamin D , Ergocalciferol , (DRISDOL ) 1.25 MG (50000 UNIT) CAPS capsule Take 1 capsule (50,000 Units total) by mouth every 7 (seven) days. 11/10/24  Yes Pokhrel, Vernal, MD     Physical Exam:    VS:  BP (!) 150/100 (BP Location: Left Arm, Patient Position: Sitting, Cuff Size: Normal)   Pulse 85   Ht 5' 6 (1.676 m)   BMI 27.26 kg/m    Wt Readings from Last 3 Encounters:  11/15/24 168 lb 14 oz (76.6 kg)   11/01/24 168 lb 14 oz (76.6 kg)  04/19/24 177 lb 7.5 oz (80.5 kg)    GEN: Well nourished, well developed AAM in no acute distress NECK: No JVD; No carotid bruits CARDIAC: RRR, no murmurs, rubs, gallops RESPIRATORY: Crackles right lung base, diminished L base, otherwise no wheezing or rhonchi   ABDOMEN: Soft, non-tender, non-distended EXTREMITIES:  Trace BLE edema; No acute deformity   Asessement and Plan:.    1. Chronic HFrEF with uncontrolled HTN complicated by CKD stage IV, intermittent dyspnea - overall concerning clinical picture with extreme complexity. I am worried about the potential for recurrent volume overload driven by potential inconsistent adherence to recommended regimen with continued uncontrolled HTN versus underlying ischemia +/- worsening renal function. BP not rechecked as value was consistent with recent ED reading, patient has not taken his medicines today, and would not change acute plan. I discussed case with Dr. Verlin. We both agree this would be difficult to manage as an outpatient and recommended he go to the ER  for further workup and likely admission to the medicine service, with consideration of diuresis depending on studies with daily monitoring of renal function. Unfortunately he vehemently denies this and refuses to go. I told him it is not safe with his kidney function to be initiating/adjusting diuretics without the day to day monitoring. He continues to refuse. We discussed risk of disability, death, worsening decline. His daughters were present for the conversation as well. He is willing to recheck labs today with BMET, TSH but states even with these, he will not go back to the hospital. We gave him the number for the nephrology team he was supposed to have been set up for after his hospitalization (he did not recall any appointment information). He also sees PCP back tomorrow. In the meantime, I advised strict compliance to the current regimen at hand to at least  see if this basic regimen can help. I encouraged him to reconsider return to the ER at any time.  2. Recent elevated troponin level - baseline EKG has shown ST/TW changes but this is chronic back to prior tracings. There remains concern about potential for underlying ischemia but he is not presently a candidate for invasive ischemic evaluation due to recent severe kidney dysfunction and medication nonadherence. Follow clinically for now. Continue chronic ASA + Plavix  (on this for hx of stroke) as well as atorvastatin  80mg  daily and carvedilol  25mg  BID.  3. History of anemia - last Hgb stable 11/15/24. Further monitoring per primary care.     Disposition: F/u with Dr. Anner in 2 weeks.  Signed, Mariacristina Aday N Aminta Sakurai, PA-C

## 2024-11-25 ENCOUNTER — Emergency Department (HOSPITAL_COMMUNITY)

## 2024-11-25 ENCOUNTER — Other Ambulatory Visit: Payer: Self-pay

## 2024-11-25 ENCOUNTER — Encounter: Payer: Self-pay | Admitting: Physician Assistant

## 2024-11-25 ENCOUNTER — Inpatient Hospital Stay (HOSPITAL_COMMUNITY)
Admission: EM | Admit: 2024-11-25 | Discharge: 2024-11-29 | DRG: 280 | Disposition: A | Discharge status: Home / Self Care | Attending: Internal Medicine | Admitting: Internal Medicine

## 2024-11-25 ENCOUNTER — Encounter (HOSPITAL_COMMUNITY): Payer: Self-pay | Admitting: Internal Medicine

## 2024-11-25 ENCOUNTER — Ambulatory Visit: Admitting: Physician Assistant

## 2024-11-25 VITALS — BP 150/100 | HR 85 | Ht 66.0 in

## 2024-11-25 DIAGNOSIS — R7989 Other specified abnormal findings of blood chemistry: Secondary | ICD-10-CM

## 2024-11-25 DIAGNOSIS — Z79899 Other long term (current) drug therapy: Secondary | ICD-10-CM

## 2024-11-25 DIAGNOSIS — I214 Non-ST elevation (NSTEMI) myocardial infarction: Secondary | ICD-10-CM | POA: Diagnosis not present

## 2024-11-25 DIAGNOSIS — F17210 Nicotine dependence, cigarettes, uncomplicated: Secondary | ICD-10-CM | POA: Diagnosis present

## 2024-11-25 DIAGNOSIS — N179 Acute kidney failure, unspecified: Secondary | ICD-10-CM | POA: Diagnosis present

## 2024-11-25 DIAGNOSIS — J9601 Acute respiratory failure with hypoxia: Secondary | ICD-10-CM | POA: Diagnosis present

## 2024-11-25 DIAGNOSIS — R5381 Other malaise: Secondary | ICD-10-CM | POA: Diagnosis present

## 2024-11-25 DIAGNOSIS — Z7902 Long term (current) use of antithrombotics/antiplatelets: Secondary | ICD-10-CM | POA: Diagnosis not present

## 2024-11-25 DIAGNOSIS — I21A1 Myocardial infarction type 2: Secondary | ICD-10-CM | POA: Diagnosis present

## 2024-11-25 DIAGNOSIS — D72829 Elevated white blood cell count, unspecified: Secondary | ICD-10-CM | POA: Diagnosis present

## 2024-11-25 DIAGNOSIS — Z8249 Family history of ischemic heart disease and other diseases of the circulatory system: Secondary | ICD-10-CM

## 2024-11-25 DIAGNOSIS — I10 Essential (primary) hypertension: Secondary | ICD-10-CM | POA: Diagnosis not present

## 2024-11-25 DIAGNOSIS — E1165 Type 2 diabetes mellitus with hyperglycemia: Secondary | ICD-10-CM | POA: Diagnosis present

## 2024-11-25 DIAGNOSIS — I130 Hypertensive heart and chronic kidney disease with heart failure and stage 1 through stage 4 chronic kidney disease, or unspecified chronic kidney disease: Secondary | ICD-10-CM | POA: Diagnosis present

## 2024-11-25 DIAGNOSIS — D649 Anemia, unspecified: Secondary | ICD-10-CM

## 2024-11-25 DIAGNOSIS — I5023 Acute on chronic systolic (congestive) heart failure: Secondary | ICD-10-CM | POA: Diagnosis present

## 2024-11-25 DIAGNOSIS — G4733 Obstructive sleep apnea (adult) (pediatric): Secondary | ICD-10-CM | POA: Diagnosis present

## 2024-11-25 DIAGNOSIS — R000 Tachycardia, unspecified: Secondary | ICD-10-CM | POA: Diagnosis present

## 2024-11-25 DIAGNOSIS — E872 Acidosis, unspecified: Secondary | ICD-10-CM | POA: Diagnosis present

## 2024-11-25 DIAGNOSIS — Z7982 Long term (current) use of aspirin: Secondary | ICD-10-CM | POA: Diagnosis not present

## 2024-11-25 DIAGNOSIS — Z813 Family history of other psychoactive substance abuse and dependence: Secondary | ICD-10-CM

## 2024-11-25 DIAGNOSIS — Z8673 Personal history of transient ischemic attack (TIA), and cerebral infarction without residual deficits: Secondary | ICD-10-CM | POA: Diagnosis not present

## 2024-11-25 DIAGNOSIS — E1151 Type 2 diabetes mellitus with diabetic peripheral angiopathy without gangrene: Secondary | ICD-10-CM | POA: Diagnosis present

## 2024-11-25 DIAGNOSIS — Z888 Allergy status to other drugs, medicaments and biological substances status: Secondary | ICD-10-CM

## 2024-11-25 DIAGNOSIS — Z83438 Family history of other disorder of lipoprotein metabolism and other lipidemia: Secondary | ICD-10-CM

## 2024-11-25 DIAGNOSIS — E875 Hyperkalemia: Secondary | ICD-10-CM | POA: Diagnosis present

## 2024-11-25 DIAGNOSIS — Z993 Dependence on wheelchair: Secondary | ICD-10-CM

## 2024-11-25 DIAGNOSIS — E785 Hyperlipidemia, unspecified: Secondary | ICD-10-CM | POA: Diagnosis present

## 2024-11-25 DIAGNOSIS — I161 Hypertensive emergency: Secondary | ICD-10-CM | POA: Diagnosis present

## 2024-11-25 DIAGNOSIS — N189 Chronic kidney disease, unspecified: Secondary | ICD-10-CM

## 2024-11-25 DIAGNOSIS — R0600 Dyspnea, unspecified: Secondary | ICD-10-CM | POA: Diagnosis not present

## 2024-11-25 DIAGNOSIS — N184 Chronic kidney disease, stage 4 (severe): Secondary | ICD-10-CM

## 2024-11-25 DIAGNOSIS — M6281 Muscle weakness (generalized): Secondary | ICD-10-CM | POA: Diagnosis present

## 2024-11-25 DIAGNOSIS — E1122 Type 2 diabetes mellitus with diabetic chronic kidney disease: Secondary | ICD-10-CM | POA: Diagnosis present

## 2024-11-25 DIAGNOSIS — Z833 Family history of diabetes mellitus: Secondary | ICD-10-CM

## 2024-11-25 DIAGNOSIS — J9602 Acute respiratory failure with hypercapnia: Principal | ICD-10-CM | POA: Diagnosis present

## 2024-11-25 DIAGNOSIS — Z886 Allergy status to analgesic agent status: Secondary | ICD-10-CM

## 2024-11-25 DIAGNOSIS — I5022 Chronic systolic (congestive) heart failure: Secondary | ICD-10-CM

## 2024-11-25 LAB — BLOOD GAS, VENOUS
Acid-Base Excess: 6.3 mmol/L — ABNORMAL HIGH (ref 0.0–2.0)
Bicarbonate: 35.2 mmol/L — ABNORMAL HIGH (ref 20.0–28.0)
Drawn by: 66297
O2 Saturation: 68.2 %
Patient temperature: 37.3
pCO2, Ven: 76 mmHg (ref 44–60)
pH, Ven: 7.28 (ref 7.25–7.43)
pO2, Ven: 43 mmHg (ref 32–45)

## 2024-11-25 LAB — CBC WITH DIFFERENTIAL/PLATELET
Abs Immature Granulocytes: 0.06 K/uL (ref 0.00–0.07)
Basophils Absolute: 0 K/uL (ref 0.0–0.1)
Basophils Relative: 0 %
Eosinophils Absolute: 0.1 K/uL (ref 0.0–0.5)
Eosinophils Relative: 1 %
HCT: 39.8 % (ref 39.0–52.0)
Hemoglobin: 12.7 g/dL — ABNORMAL LOW (ref 13.0–17.0)
Immature Granulocytes: 1 %
Lymphocytes Relative: 16 %
Lymphs Abs: 1.8 K/uL (ref 0.7–4.0)
MCH: 32.2 pg (ref 26.0–34.0)
MCHC: 31.9 g/dL (ref 30.0–36.0)
MCV: 100.8 fL — ABNORMAL HIGH (ref 80.0–100.0)
Monocytes Absolute: 0.6 K/uL (ref 0.1–1.0)
Monocytes Relative: 5 %
Neutro Abs: 8.8 K/uL — ABNORMAL HIGH (ref 1.7–7.7)
Neutrophils Relative %: 77 %
Platelets: 290 K/uL (ref 150–400)
RBC: 3.95 MIL/uL — ABNORMAL LOW (ref 4.22–5.81)
RDW: 14.1 % (ref 11.5–15.5)
WBC: 11.4 K/uL — ABNORMAL HIGH (ref 4.0–10.5)
nRBC: 0 % (ref 0.0–0.2)

## 2024-11-25 LAB — BASIC METABOLIC PANEL WITH GFR
Anion gap: 18 — ABNORMAL HIGH (ref 5–15)
BUN: 39 mg/dL — ABNORMAL HIGH (ref 6–20)
CO2: 16 mmol/L — ABNORMAL LOW (ref 22–32)
Calcium: 8.9 mg/dL (ref 8.9–10.3)
Chloride: 107 mmol/L (ref 98–111)
Creatinine, Ser: 4.05 mg/dL — ABNORMAL HIGH (ref 0.61–1.24)
GFR, Estimated: 16 mL/min — ABNORMAL LOW (ref >60)
Glucose, Bld: 217 mg/dL — ABNORMAL HIGH (ref 70–99)
Potassium: 4.8 mmol/L (ref 3.5–5.1)
Sodium: 142 mmol/L (ref 135–145)

## 2024-11-25 LAB — APTT: aPTT: 29 s (ref 24–36)

## 2024-11-25 LAB — PRO BRAIN NATRIURETIC PEPTIDE: Pro Brain Natriuretic Peptide: 5832 pg/mL — ABNORMAL HIGH (ref <300.0)

## 2024-11-25 LAB — RESP PANEL BY RT-PCR (RSV, FLU A&B, COVID)  RVPGX2
Influenza A by PCR: NEGATIVE
Influenza B by PCR: NEGATIVE
Resp Syncytial Virus by PCR: NEGATIVE
SARS Coronavirus 2 by RT PCR: NEGATIVE

## 2024-11-25 LAB — PROTIME-INR
INR: 0.9 (ref 0.8–1.2)
Prothrombin Time: 13.2 s (ref 11.4–15.2)

## 2024-11-25 LAB — TROPONIN T, HIGH SENSITIVITY
Troponin T High Sensitivity: 1086 ng/L (ref 0–19)
Troponin T High Sensitivity: 1298 ng/L (ref 0–19)

## 2024-11-25 LAB — MAGNESIUM: Magnesium: 1.9 mg/dL (ref 1.7–2.4)

## 2024-11-25 MED ORDER — MORPHINE SULFATE (PF) 4 MG/ML IV SOLN
4.0000 mg | Freq: Once | INTRAVENOUS | Status: AC
  Administered 2024-11-25: 21:00:00 4 mg via INTRAVENOUS
  Filled 2024-11-25: qty 1

## 2024-11-25 MED ORDER — NITROGLYCERIN IN D5W 200-5 MCG/ML-% IV SOLN
5.0000 ug/min | INTRAVENOUS | Status: DC
  Administered 2024-11-25: 20:00:00 100 ug/min via INTRAVENOUS
  Administered 2024-11-26 (×2): 190 ug/min via INTRAVENOUS
  Filled 2024-11-25 (×4): qty 250

## 2024-11-25 MED ORDER — FUROSEMIDE 10 MG/ML IJ SOLN
80.0000 mg | Freq: Once | INTRAMUSCULAR | Status: AC
  Administered 2024-11-25: 22:00:00 80 mg via INTRAVENOUS
  Filled 2024-11-25: qty 8

## 2024-11-25 MED ORDER — FUROSEMIDE 10 MG/ML IJ SOLN
40.0000 mg | Freq: Once | INTRAMUSCULAR | Status: AC
  Administered 2024-11-25: 23:00:00 40 mg via INTRAVENOUS
  Filled 2024-11-25: qty 4

## 2024-11-25 MED ORDER — HEPARIN (PORCINE) 25000 UT/250ML-% IV SOLN
1000.0000 [IU]/h | INTRAVENOUS | Status: DC
  Administered 2024-11-25 – 2024-11-27 (×3): 1000 [IU]/h via INTRAVENOUS
  Filled 2024-11-25 (×2): qty 250

## 2024-11-25 MED ORDER — PROCHLORPERAZINE EDISYLATE 10 MG/2ML IJ SOLN: 5.0000 mg | Freq: Four times a day (QID) | INTRAMUSCULAR | Status: DC | PRN

## 2024-11-25 MED ORDER — MELATONIN 3 MG PO TABS
6.0000 mg | ORAL_TABLET | Freq: Every evening | ORAL | Status: DC | PRN
  Administered 2024-11-26 – 2024-11-28 (×3): 6 mg via ORAL
  Filled 2024-11-25 (×3): qty 2

## 2024-11-25 MED ORDER — ACETAMINOPHEN 500 MG PO TABS: 500.0000 mg | ORAL_TABLET | Freq: Four times a day (QID) | ORAL | Status: DC | PRN

## 2024-11-25 MED ORDER — HEPARIN BOLUS VIA INFUSION
4000.0000 [IU] | Freq: Once | INTRAVENOUS | Status: AC
  Administered 2024-11-25: 21:00:00 4000 [IU] via INTRAVENOUS

## 2024-11-25 MED ORDER — POLYETHYLENE GLYCOL 3350 17 G PO PACK
17.0000 g | PACK | Freq: Every day | ORAL | Status: DC | PRN
  Administered 2024-11-27 – 2024-11-29 (×2): 17 g via ORAL
  Filled 2024-11-25 (×2): qty 1

## 2024-11-25 MED ORDER — HEPARIN (PORCINE) 25000 UT/250ML-% IV SOLN
1000.0000 [IU]/h | INTRAVENOUS | Status: DC
  Administered 2024-11-25: 21:00:00 1000 [IU]/h via INTRAVENOUS
  Filled 2024-11-25: qty 250

## 2024-11-25 NOTE — Consult Note (Signed)
 PHARMACY - ANTICOAGULATION CONSULT NOTE  Pharmacy Consult for heparin  infusion Indication: ACS/STEMI  Allergies[1]  Patient Measurements:  HDW 76.6 kg  Vital Signs: Temp: 99.2 F (37.3 C) (12/18 1915) Temp Source: Oral (12/18 1915) BP: 168/131 (12/18 2015) Pulse Rate: 118 (12/18 2015)  Labs: Recent Labs    11/25/24 1925  HGB 12.7*  HCT 39.8  PLT 290  CREATININE 4.05*    Estimated Creatinine Clearance: 19.8 mL/min (A) (by C-G formula based on SCr of 4.05 mg/dL (H)).   Medical History: Past Medical History:  Diagnosis Date   Acute alcoholic pancreatitis    Arthritis    Chronic pain    Depression    Diabetes mellitus without complication (HCC)    Essential hypertension, benign    GERD (gastroesophageal reflux disease)    Headache    HTN (hypertension) 11/27/2015   Hyperlipidemia    Peripheral vascular disease    Sleep apnea    could not tolerate CPAP   Stroke (HCC)    2016    Medications:  No home anticoagulants per pharmacist review  Assessment: 58 yo male presented to ED with complaint of shortness of breath and weakness.  Troponin found to be elevated.  Pharmacy consulted to initiate heparin  infusion.  Baseline aPTT and PT/INR ordered  Goal of Therapy:  Heparin  level 0.3-0.7 units/ml Monitor platelets by anticoagulation protocol: Yes   Plan:  Give 4000 units bolus x 1 Start heparin  infusion at 1000 units/hr Check anti-Xa level in 8 hours and daily while on heparin  Continue to monitor H&H and platelets  Kayla JULIANNA Blew 11/25/2024,8:56 PM      [1]  Allergies Allergen Reactions   Ibuprofen  Other (See Comments)    Avoid per PCP   Oxcarbazepine Other (See Comments)    Patient goes out of right state of mind.

## 2024-11-25 NOTE — ED Triage Notes (Signed)
 BIB [facility] from home. EMS upon arrival pt was  laying in bed with co weakness and SOB, lung sounds silent lower bilaterally for EMS with  clear upper lobe bilaterally lungs sounds. Pt on RA was SpO2 of 60% EMS placed pt on 15L  NRB with increase to 84% on the SpO2. Htn noted by EMS of 220/110, LBB on monitor with no stemi noted by Jolynn Pack MD online.

## 2024-11-25 NOTE — ED Provider Notes (Signed)
 [facility] AT [facility] Provider Note   CSN: [id number] Arrival date & time: 11/25/24  8094     Patient presents with: No chief complaint on file.   Taylor Obrien is a 58 y.o. male.  He is presenting with acute shortness of breath that started today.  He said he went to the heart doctor today who told him to come to the hospital and be admitted.  Has known history of heart failure and CKD.  Also endorsing chest pain.  EMS said his sats were in the 60s and they placed him on a nonrebreather.  {Add pertinent medical, surgical, social history, OB history to YEP:67052} The history is provided by the patient.  Shortness of Breath Severity:  Severe Onset quality:  Gradual Duration:  1 day Timing:  Constant Progression:  Unchanged Chronicity:  Recurrent Relieved by:  None tried Worsened by:  Activity and coughing Associated symptoms: chest pain, cough and diaphoresis   Associated symptoms: no hemoptysis   Risk factors: tobacco use        Prior to Admission medications  Medication Sig Start Date End Date Taking? Authorizing Provider  amLODipine  (NORVASC ) 10 MG tablet Take 1 tablet (10 mg total) by mouth daily. 01/19/16   Kirsteins, Prentice BRAVO, MD  aspirin  81 MG chewable tablet Chew 1 tablet (81 mg total) by mouth daily. 06/27/24   Daralene Lonni BIRCH, PA-C  atorvastatin  (LIPITOR ) 80 MG tablet Take 1 tablet (80 mg total) by mouth daily. 04/26/24   Jadapalle, Sree, MD  calcitRIOL  (ROCALTROL ) 0.25 MCG capsule Take 1 capsule (0.25 mcg total) by mouth every Monday, Wednesday, and Friday. 11/08/24 02/06/25  Pokhrel, Laxman, MD  carvedilol  (COREG ) 25 MG tablet Take 1 tablet (25 mg total) by mouth 2 (two) times daily with a meal. 11/06/24 02/04/25  Pokhrel, Vernal, MD  clopidogrel  (PLAVIX ) 75 MG tablet Take 1 tablet (75 mg total) by mouth daily. 06/27/24   Daralene Lonni BIRCH, PA-C  escitalopram  (LEXAPRO ) 20 MG tablet Take 20 mg by mouth daily. 07/30/22   [provider]  fenofibrate  (TRICOR ) 145 MG tablet Take 145 mg by mouth daily. 04/10/22   [provider]  isosorbide -hydrALAZINE  (BIDIL ) 20-37.5 MG tablet Take 2 tablets by mouth 3 (three) times daily. 11/06/24 02/04/25  Pokhrel, Vernal, MD  lidocaine  (LIDODERM ) 5 % Place 2 patches onto the skin daily. Remove & Discard patch within 12 hours or as directed by MD Patient taking differently: Place 2 patches onto the skin daily as needed (Pain). Remove & Discard patch within 12 hours or as directed by MD 04/26/24   Jadapalle, Sree, MD  melatonin 5 MG TABS Take 0.5 tablets (2.5 mg total) by mouth at bedtime. 04/26/24   Jadapalle, Sree, MD  nicotine  (NICODERM CQ  - DOSED IN MG/24 HOURS) 21 mg/24hr patch Place 1 patch (21 mg total) onto the skin daily at 6 (six) AM. Patient taking differently: Place 21 mg onto the skin daily as needed (Cravings). 04/26/24   Jadapalle, Sree, MD  pantoprazole  (PROTONIX ) 40 MG tablet Take 1 tablet (40 mg total) by mouth at bedtime. 01/19/16   Kirsteins, Prentice BRAVO, MD  VASCEPA  1 g capsule Take 2 g by mouth 2 (two) times daily. 03/12/22   [provider]  Vitamin D , Ergocalciferol , (DRISDOL ) 1.25 MG (50000 UNIT) CAPS capsule Take 1 capsule (50,000 Units total) by mouth every 7 (seven) days. 11/10/24   Pokhrel, Laxman, MD    Allergies: Ibuprofen  and Oxcarbazepine    Review  of Systems  Constitutional:  Positive for diaphoresis.  Respiratory:  Positive for cough and shortness of breath. Negative for hemoptysis.   Cardiovascular:  Positive for chest pain.    Updated Vital Signs There were no vitals taken for this visit.  Physical Exam Vitals and nursing note reviewed.  Constitutional:      General: He is in acute distress.     Appearance: He is well-developed. He is diaphoretic.  HENT:     Head: Normocephalic and atraumatic.  Eyes:     Conjunctiva/sclera: Conjunctivae normal.  Cardiovascular:     Rate and Rhythm: Regular rhythm. Tachycardia present.      Heart sounds: No murmur heard. Pulmonary:     Effort: Respiratory distress present.     Breath sounds: Rales present.  Abdominal:     Palpations: Abdomen is soft.     Tenderness: There is no abdominal tenderness.  Musculoskeletal:        General: No deformity.     Cervical back: Neck supple.  Skin:    General: Skin is warm.     Capillary Refill: Capillary refill takes less than 2 seconds.  Neurological:     General: No focal deficit present.     Mental Status: He is alert.     (all labs ordered are listed, but only abnormal results are displayed) Labs Reviewed - No data to display  EKG: None  Radiology: No results found.  {Document cardiac monitor, telemetry assessment procedure when appropriate:32947} Procedures   Medications Ordered in the ED - No data to display  Clinical Course as of 11/25/24 2154  Thu Nov 25, 2024  1937 Chest x-ray interpreted by me as congestive heart failure.  Blood pressure significantly elevated.  On BiPAP.  Ordering nitro drip. [MB]  2118 Discussed with cardiology Dr. Thad Lunger.  She said he likely does not have any significant ischemic disease and would not need transfer to [facility].  Does recommend diuresis and blood pressure control.  Probably will need input from nephrology at some point [MB]  2141 Discussed with Dr. Shona [facility] hospitalist will evaluate patient for admission. [MB]    Clinical Course User Index [MB] Towana Ozell BROCKS, MD   {Click here for ABCD2, HEART and other calculators REFRESH Note before signing:1}                              Medical Decision Making Amount and/or Complexity of Data Reviewed Labs: ordered. Radiology: ordered.  Risk Prescription drug management. Decision regarding hospitalization.   This patient complains of ***; this involves an extensive number of treatment Options and is a complaint that carries with it a high risk of complications and morbidity. The differential includes ***  I ordered,  reviewed and interpreted labs, which included *** I ordered medication *** and reviewed PMP when indicated. I ordered imaging studies which included *** and I independently    visualized and interpreted imaging which showed *** Additional history obtained from *** Previous records obtained and reviewed *** I consulted *** and discussed lab and imaging findings and discussed disposition.  Cardiac monitoring reviewed, *** Social determinants considered, *** Critical Interventions: ***  After the interventions stated above, I reevaluated the patient and found *** Admission and further testing considered, ***   {Document critical care time when appropriate  Document review of labs and clinical decision tools ie CHADS2VASC2, etc  Document your independent review of radiology images and any outside records  Document your discussion with family members, caretakers and with consultants  Document social determinants of health affecting pt's care  Document your decision making why or why not admission, treatments were needed:32947:::1}   Final diagnoses:  None    ED Discharge Orders     None

## 2024-11-25 NOTE — H&P (Incomplete)
 History and Physical  Taylor Obrien FMW:[id number] DOB: [birth year] DOA: 11/25/2024  Referring physician: ***  PCP: Trudy Ave, NP  Outpatient Specialists: *** Patient coming from: *** & is able to ambulate ***  Chief Complaint: ***   HPI: Taylor Obrien is a 58 y.o. male with medical history significant for *** (For level 3, the HPI must include 4+ descriptors: Location, Quality, Severity, Duration, Timing, Context, modifying factors, associated signs/symptoms and/or status of 3+ chronic problems.)   ED Course: ***  Review of Systems: Review of systems as noted in the HPI. All other systems reviewed and are negative.   Past Medical History:  Diagnosis Date   Acute alcoholic pancreatitis    Arthritis    Chronic pain    Depression    Diabetes mellitus without complication (HCC)    Essential hypertension, benign    GERD (gastroesophageal reflux disease)    Headache    HTN (hypertension) 11/27/2015   Hyperlipidemia    Peripheral vascular disease    Sleep apnea    could not tolerate CPAP   Stroke (HCC)    2016   Past Surgical History:  Procedure Laterality Date   BIOPSY  08/19/2022   Procedure: BIOPSY;  Surgeon: Cindie Carlin POUR, DO;  [facility]: [facility];  Service: Endoscopy;;   CLOSED REDUCTION MANDIBULAR FRACTURE W/ ARCH BARS     + multiple extractions   COLONOSCOPY WITH PROPOFOL  N/A 08/19/2022   Procedure: COLONOSCOPY WITH PROPOFOL ;  Surgeon: Cindie Carlin POUR, DO;  Location: [facility];  Service: Endoscopy;  Laterality: N/A;  9:15am   Condyloma resection     ESOPHAGOGASTRODUODENOSCOPY (EGD) WITH PROPOFOL  N/A 08/19/2022   Procedure: ESOPHAGOGASTRODUODENOSCOPY (EGD) WITH PROPOFOL ;  Surgeon: Cindie Carlin POUR, DO;  [facility];  Service: Endoscopy;  Laterality: N/A;   MULTIPLE EXTRACTIONS WITH ALVEOLOPLASTY Bilateral 01/23/2018   Procedure: DENTAL EXTRACTION OF TEETH NUMBER ONE, TWO, THREE, FOUR, FIVE, SIX, SEVEN, EIGHT, NINE, TEN, ELEVEN, TWELVE,  THIRTEEN, FOURTEEN, FIFTEEN, SIXTEEN, SEVENTEEN, TWENTY, TWENTY-ONE, TWENTY-TWO, TWENTY-THREE, TWENTY-FOUR, TWENTY-FIVE, TWENTY-SIX, TWENTY-SEVEN, TWENTY-EIGHT, TWENTY-NINE, THIRTY-TWO WITH ALVEOLOPLASTY;  Surgeon: Sheryle Hamilton, DDS;  Location: [facility] OR;  Service: Oral Surgery;  Lat   POLYPECTOMY  08/19/2022   Procedure: POLYPECTOMY;  Surgeon: Cindie Carlin POUR, DO;  Location: [facility];  Service: Endoscopy;;   RADIOLOGY WITH ANESTHESIA N/A 11/18/2015   Procedure: RADIOLOGY WITH ANESTHESIA;  Surgeon: Thyra Nash, MD;  Location: [facility] OR;  Service: Radiology;  Laterality: N/A;   Removal foreign body right shoulder     Right rotator cuff repair     TEE WITHOUT CARDIOVERSION N/A 11/21/2015   Procedure: TRANSESOPHAGEAL ECHOCARDIOGRAM (TEE);  Surgeon: Ezra GORMAN Shuck, MD;  Location: [facility] ENDOSCOPY;  Service: Cardiovascular;  Laterality: N/A;   TOOTH EXTRACTION N/A 01/24/2018   Procedure: SUTURE ORAL WOUND;  Surgeon: Sheryle Hamilton, DDS;  Location: [facility] OR;  Service: Oral Surgery;  Laterality: N/A;    Social History:  reports that he has been smoking cigarettes. He has a 15 pack-year smoking history. He has never used smokeless tobacco. He reports that he does not currently use alcohol. He reports current drug use. Drug: Marijuana.   Allergies[1]  Family History  Problem Relation Age of Onset   Diabetes Mother    Hypertension Mother    Drug abuse Mother    Hyperlipidemia Mother    Diabetes Father    Hypertension Father    Hyperlipidemia Father    Diabetes Brother    Hypertension Brother     ***  Prior to Admission medications  Medication Sig Start Date End Date Taking? Authorizing Provider  amLODipine  (NORVASC ) 10 MG tablet Take 1 tablet (10 mg total) by mouth daily. 01/19/16   Kirsteins, Prentice BRAVO, MD  aspirin  81 MG chewable tablet Chew 1 tablet (81 mg total) by mouth daily. 06/27/24   Daralene Lonni BIRCH, PA-C  atorvastatin  (LIPITOR ) 80 MG tablet Take 1 tablet (80 mg total) by mouth  daily. 04/26/24   Jadapalle, Sree, MD  calcitRIOL  (ROCALTROL ) 0.25 MCG capsule Take 1 capsule (0.25 mcg total) by mouth every Monday, Wednesday, and Friday. 11/08/24 02/06/25  Pokhrel, Laxman, MD  carvedilol  (COREG ) 25 MG tablet Take 1 tablet (25 mg total) by mouth 2 (two) times daily with a meal. 11/06/24 02/04/25  Pokhrel, Vernal, MD  clopidogrel  (PLAVIX ) 75 MG tablet Take 1 tablet (75 mg total) by mouth daily. 06/27/24   Daralene Lonni BIRCH, PA-C  escitalopram  (LEXAPRO ) 20 MG tablet Take 20 mg by mouth daily. 07/30/22   [provider]  fenofibrate  (TRICOR ) 145 MG tablet Take 145 mg by mouth daily. 04/10/22   [provider]  isosorbide -hydrALAZINE  (BIDIL ) 20-37.5 MG tablet Take 2 tablets by mouth 3 (three) times daily. 11/06/24 02/04/25  Pokhrel, Vernal, MD  lidocaine  (LIDODERM ) 5 % Place 2 patches onto the skin daily. Remove & Discard patch within 12 hours or as directed by MD Patient taking differently: Place 2 patches onto the skin daily as needed (Pain). Remove & Discard patch within 12 hours or as directed by MD 04/26/24   Jadapalle, Sree, MD  melatonin 5 MG TABS Take 0.5 tablets (2.5 mg total) by mouth at bedtime. 04/26/24   Jadapalle, Sree, MD  nicotine  (NICODERM CQ  - DOSED IN MG/24 HOURS) 21 mg/24hr patch Place 1 patch (21 mg total) onto the skin daily at 6 (six) AM. Patient taking differently: Place 21 mg onto the skin daily as needed (Cravings). 04/26/24   Jadapalle, Sree, MD  pantoprazole  (PROTONIX ) 40 MG tablet Take 1 tablet (40 mg total) by mouth at bedtime. 01/19/16   Kirsteins, Prentice BRAVO, MD  VASCEPA  1 g capsule Take 2 g by mouth 2 (two) times daily. 03/12/22   [provider]  Vitamin D , Ergocalciferol , (DRISDOL ) 1.25 MG (50000 UNIT) CAPS capsule Take 1 capsule (50,000 Units total) by mouth every 7 (seven) days. 11/10/24   Pokhrel, Vernal, MD    Physical Exam: BP (!) 168/131   Pulse (!) 118   Temp 99.2 F (37.3 C) (Oral) Comment: Simultaneous filing. User may not  have seen previous data. Comment (Src): Simultaneous filing. User may not have seen previous data.  Resp (!) 24   SpO2 97%   General: 58 y.o. year-old male well developed well nourished in no acute distress.  Alert and oriented x3. Cardiovascular: Regular rate and rhythm with no rubs or gallops.  No thyromegaly or JVD noted.  No lower extremity edema. 2/4 pulses in all 4 extremities. Respiratory: Clear to auscultation with no wheezes or rales. Good inspiratory effort. Abdomen: Soft nontender nondistended with normal bowel sounds x4 quadrants. Muskuloskeletal: No cyanosis, clubbing or edema noted bilaterally Neuro: CN II-XII intact, strength, sensation, reflexes Skin: No ulcerative lesions noted or rashes Psychiatry: Judgement and insight appear normal. Mood is appropriate for condition and setting          Labs on Admission:  Basic Metabolic Panel: Recent Labs  Lab 11/25/24 1925  NA 142  K 4.8  CL 107  CO2 16*  GLUCOSE 217*  BUN 39*  CREATININE 4.05*  CALCIUM  8.9  MG 1.9   Liver Function Tests: No results for input(s): AST, ALT, ALKPHOS, BILITOT, PROT, ALBUMIN in the last 168 hours. No results for input(s): LIPASE, AMYLASE in the last 168 hours. No results for input(s): AMMONIA in the last 168 hours. CBC: Recent Labs  Lab 11/25/24 1925  WBC 11.4*  NEUTROABS 8.8*  HGB 12.7*  HCT 39.8  MCV 100.8*  PLT 290   Cardiac Enzymes: No results for input(s): CKTOTAL, CKMB, CKMBINDEX, TROPONINI in the last 168 hours.  BNP (last 3 results) No results for input(s): BNP in the last 8760 hours.  ProBNP (last 3 results) Recent Labs    10/30/24 2210 11/25/24 1925  PROBNP 6,550.0* 5,832.0*    CBG: No results for input(s): GLUCAP in the last 168 hours.  Radiological Exams on Admission: DG Chest Port 1 View Result Date: 11/25/2024 EXAM: 1 VIEW(S) XRAY OF THE CHEST 11/25/2024 07:32:09 PM COMPARISON: 11/15/2024 CLINICAL HISTORY: SOB FINDINGS:  LUNGS AND PLEURA: Increased central vascular congestion with diffuse parenchymal opacity bilaterally consistent with edema. Small left pleural effusion is noted. No pneumothorax. HEART AND MEDIASTINUM: Cardiomegaly. Increased central vascular congestion. BONES AND SOFT TISSUES: No acute osseous abnormality. IMPRESSION: 1. Worsening congestive heart failure 2. Small left pleural effusion. Electronically signed by: Oneil Devonshire MD 11/25/2024 07:34 PM EST RP Workstation: [id number]    EKG: I independently viewed the EKG done and my findings are as followed: ***   Assessment/Plan Present on Admission:  Acute hypoxemic respiratory failure (HCC)  Principal Problem:   Acute hypoxemic respiratory failure (HCC)   DVT prophylaxis: ***   Code Status: ***   Family Communication: ***   Disposition Plan: ***   Consults called: ***   Admission status: ***    Status is: Inpatient {Inpatient:23812}   Terry LOISE Hurst MD [name] Pager [phone]  If 7PM-7AM, please contact night-coverage [email or web address] Password [email or web address]  11/25/2024, 9:43 PM      [1]  Allergies Allergen Reactions   Ibuprofen  Other (See Comments)    Avoid per PCP   Oxcarbazepine Other (See Comments)    Patient goes out of right state of mind.

## 2024-11-25 NOTE — Patient Instructions (Addendum)
 Medication Instructions:  PLEASE DO NOT TAKE ANY OF YOUR MEDICATIONS TODAY.  Lab Work: BMET AND TSH TO BE DONE TODAY.  Testing/Procedures: NONE  Follow-Up: At [facility], you and your health needs are our priority.  As part of our continuing mission to provide you with exceptional heart care, our providers are all part of one team.  This team includes your primary Cardiologist (physician) and Advanced Practice Providers or APPs (Physician Assistants and Nurse Practitioners) who all work together to provide you with the care you need, when you need it.  Your next appointment:   December 13, 2024 at 9:40 AM  Provider:   [name], MD    We recommend signing up for the patient portal called MyChart.  Sign up information is provided on this After Visit Summary.  MyChart is used to connect with patients for Virtual Visits (Telemedicine).  Patients are able to view lab/test results, encounter notes, upcoming appointments, etc.  Non-urgent messages can be sent to your provider as well.   To learn more about what you can do with MyChart, go to [email or web address].   Other Instructions:  We are sending you to the hospital due to concerns of your shortness of breath.  You have a significant medical history to include chronic systolic heart failure, severe chronic kidney disease, recent elevated troponins, prior stroke, diabetes, peripheral vascular disease, prior pancreatitis attributed to alcohol use, and wheelchair dependence. You were seen for follow-up today and we remain concerned about your shortness of breath and back pain.   It is difficult to manage these symptoms as an outpatient given your severe kidney disease and recent elevated troponins.    [facility] Address: [address] [place], [place] [address] Phone: [phone]

## 2024-11-26 ENCOUNTER — Ambulatory Visit: Admitting: Family Medicine

## 2024-11-26 ENCOUNTER — Ambulatory Visit: Payer: Self-pay | Admitting: Physician Assistant

## 2024-11-26 ENCOUNTER — Inpatient Hospital Stay (HOSPITAL_COMMUNITY)

## 2024-11-26 DIAGNOSIS — I214 Non-ST elevation (NSTEMI) myocardial infarction: Secondary | ICD-10-CM

## 2024-11-26 DIAGNOSIS — J9601 Acute respiratory failure with hypoxia: Secondary | ICD-10-CM | POA: Diagnosis not present

## 2024-11-26 LAB — BASIC METABOLIC PANEL WITH GFR
Anion gap: 14 (ref 5–15)
BUN/Creatinine Ratio: 9 (ref 9–20)
BUN: 37 mg/dL — ABNORMAL HIGH (ref 6–24)
BUN: 46 mg/dL — ABNORMAL HIGH (ref 6–20)
CO2: 18 mmol/L — ABNORMAL LOW (ref 20–29)
CO2: 18 mmol/L — ABNORMAL LOW (ref 22–32)
Calcium: 9.1 mg/dL (ref 8.7–10.2)
Calcium: 8.4 mg/dL — ABNORMAL LOW (ref 8.9–10.3)
Chloride: 106 mmol/L (ref 96–106)
Chloride: 105 mmol/L (ref 98–111)
Creatinine, Ser: 4.05 mg/dL — ABNORMAL HIGH (ref 0.76–1.27)
Creatinine, Ser: 4.59 mg/dL — ABNORMAL HIGH (ref 0.61–1.24)
GFR, Estimated: 14 mL/min — ABNORMAL LOW
Glucose, Bld: 134 mg/dL — ABNORMAL HIGH (ref 70–99)
Glucose: 141 mg/dL — ABNORMAL HIGH (ref 70–99)
Potassium: 5 mmol/L (ref 3.5–5.2)
Potassium: 4.1 mmol/L (ref 3.5–5.1)
Sodium: 140 mmol/L (ref 134–144)
Sodium: 137 mmol/L (ref 135–145)
eGFR: 16 mL/min/1.73 — ABNORMAL LOW

## 2024-11-26 LAB — COMPREHENSIVE METABOLIC PANEL WITH GFR
ALT: 12 U/L (ref 0–44)
AST: 37 U/L (ref 15–41)
Albumin: 3.1 g/dL — ABNORMAL LOW (ref 3.5–5.0)
Alkaline Phosphatase: 60 U/L (ref 38–126)
Anion gap: 9 (ref 5–15)
BUN: 42 mg/dL — ABNORMAL HIGH (ref 6–20)
CO2: 24 mmol/L (ref 22–32)
Calcium: 8.3 mg/dL — ABNORMAL LOW (ref 8.9–10.3)
Chloride: 107 mmol/L (ref 98–111)
Creatinine, Ser: 4.37 mg/dL — ABNORMAL HIGH (ref 0.61–1.24)
GFR, Estimated: 15 mL/min — ABNORMAL LOW
Glucose, Bld: 109 mg/dL — ABNORMAL HIGH (ref 70–99)
Potassium: 6.2 mmol/L — ABNORMAL HIGH (ref 3.5–5.1)
Sodium: 140 mmol/L (ref 135–145)
Total Bilirubin: 0.2 mg/dL (ref 0.0–1.2)
Total Protein: 5.9 g/dL — ABNORMAL LOW (ref 6.5–8.1)

## 2024-11-26 LAB — ECHOCARDIOGRAM LIMITED
Area-P 1/2: 4.49 cm2
Calc EF: 31.4 %
Height: 66 in
S' Lateral: 4.3 cm
Single Plane A2C EF: 35.3 %
Single Plane A4C EF: 30.8 %
Weight: 2624.36 [oz_av]

## 2024-11-26 LAB — GLUCOSE, CAPILLARY
Glucose-Capillary: 117 mg/dL — ABNORMAL HIGH (ref 70–99)
Glucose-Capillary: 113 mg/dL — ABNORMAL HIGH (ref 70–99)
Glucose-Capillary: 129 mg/dL — ABNORMAL HIGH (ref 70–99)
Glucose-Capillary: 131 mg/dL — ABNORMAL HIGH (ref 70–99)
Glucose-Capillary: 149 mg/dL — ABNORMAL HIGH (ref 70–99)

## 2024-11-26 LAB — PHOSPHORUS: Phosphorus: 4.9 mg/dL — ABNORMAL HIGH (ref 2.5–4.6)

## 2024-11-26 LAB — CBC
HCT: 33.8 % — ABNORMAL LOW (ref 39.0–52.0)
Hemoglobin: 10.7 g/dL — ABNORMAL LOW (ref 13.0–17.0)
MCH: 31.6 pg (ref 26.0–34.0)
MCHC: 31.7 g/dL (ref 30.0–36.0)
MCV: 99.7 fL (ref 80.0–100.0)
Platelets: 247 K/uL (ref 150–400)
RBC: 3.39 MIL/uL — ABNORMAL LOW (ref 4.22–5.81)
RDW: 14.1 % (ref 11.5–15.5)
WBC: 12.4 K/uL — ABNORMAL HIGH (ref 4.0–10.5)
nRBC: 0 % (ref 0.0–0.2)

## 2024-11-26 LAB — TSH: TSH: 0.932 u[IU]/mL (ref 0.450–4.500)

## 2024-11-26 LAB — HEPARIN LEVEL (UNFRACTIONATED)
Heparin Unfractionated: 0.58 [IU]/mL (ref 0.30–0.70)
Heparin Unfractionated: 0.53 [IU]/mL (ref 0.30–0.70)

## 2024-11-26 LAB — MRSA NEXT GEN BY PCR, NASAL: MRSA by PCR Next Gen: NOT DETECTED

## 2024-11-26 MED ORDER — CLOPIDOGREL BISULFATE 75 MG PO TABS
75.0000 mg | ORAL_TABLET | Freq: Every day | ORAL | Status: DC
  Administered 2024-11-26 – 2024-11-29 (×4): 75 mg via ORAL
  Filled 2024-11-26 (×4): qty 1

## 2024-11-26 MED ORDER — ATORVASTATIN CALCIUM 40 MG PO TABS
80.0000 mg | ORAL_TABLET | Freq: Every day | ORAL | Status: DC
  Administered 2024-11-26 – 2024-11-29 (×4): 80 mg via ORAL
  Filled 2024-11-26 (×4): qty 2

## 2024-11-26 MED ORDER — INSULIN ASPART 100 UNIT/ML IJ SOLN
0.0000 [IU] | INTRAMUSCULAR | Status: DC
  Administered 2024-11-26 – 2024-11-28 (×7): 1 [IU] via SUBCUTANEOUS
  Administered 2024-11-28: 2 [IU] via SUBCUTANEOUS
  Administered 2024-11-29 (×2): 3 [IU] via SUBCUTANEOUS
  Filled 2024-11-26 (×10): qty 1

## 2024-11-26 MED ORDER — ISOSORB DINITRATE-HYDRALAZINE 20-37.5 MG PO TABS
2.0000 | ORAL_TABLET | Freq: Three times a day (TID) | ORAL | Status: DC
  Administered 2024-11-26 – 2024-11-29 (×10): 2 via ORAL
  Filled 2024-11-26 (×10): qty 2

## 2024-11-26 MED ORDER — METOPROLOL TARTRATE 5 MG/5ML IV SOLN
5.0000 mg | Freq: Every day | INTRAVENOUS | Status: DC | PRN
  Administered 2024-11-26 – 2024-11-28 (×3): 5 mg via INTRAVENOUS
  Filled 2024-11-26 (×3): qty 5

## 2024-11-26 MED ORDER — METOPROLOL TARTRATE 5 MG/5ML IV SOLN
5.0000 mg | Freq: Once | INTRAVENOUS | Status: AC
  Administered 2024-11-26: 5 mg via INTRAVENOUS
  Filled 2024-11-26: qty 5

## 2024-11-26 MED ORDER — ASPIRIN 300 MG RE SUPP
300.0000 mg | Freq: Once | RECTAL | Status: AC
  Administered 2024-11-26: 300 mg via RECTAL
  Filled 2024-11-26: qty 1

## 2024-11-26 MED ORDER — FENOFIBRATE 160 MG PO TABS
160.0000 mg | ORAL_TABLET | Freq: Every day | ORAL | Status: DC
  Administered 2024-11-27 – 2024-11-29 (×3): 160 mg via ORAL
  Filled 2024-11-26 (×3): qty 1

## 2024-11-26 MED ORDER — HYDROMORPHONE HCL 1 MG/ML IJ SOLN
0.5000 mg | INTRAMUSCULAR | Status: AC | PRN
  Administered 2024-11-26 – 2024-11-28 (×4): 0.5 mg via INTRAVENOUS
  Filled 2024-11-26 (×4): qty 0.5

## 2024-11-26 MED ORDER — METHOCARBAMOL 500 MG PO TABS
500.0000 mg | ORAL_TABLET | Freq: Four times a day (QID) | ORAL | Status: DC | PRN
  Administered 2024-11-26: 500 mg via ORAL
  Filled 2024-11-26 (×2): qty 1

## 2024-11-26 MED ORDER — PANTOPRAZOLE SODIUM 40 MG PO TBEC
40.0000 mg | DELAYED_RELEASE_TABLET | Freq: Every day | ORAL | Status: DC
  Administered 2024-11-26 – 2024-11-28 (×3): 40 mg via ORAL
  Filled 2024-11-26 (×3): qty 1

## 2024-11-26 MED ORDER — ASPIRIN 81 MG PO CHEW
81.0000 mg | CHEWABLE_TABLET | Freq: Every day | ORAL | Status: DC
  Administered 2024-11-27 – 2024-11-29 (×3): 81 mg via ORAL
  Filled 2024-11-26 (×3): qty 1

## 2024-11-26 MED ORDER — MORPHINE SULFATE (PF) 4 MG/ML IV SOLN
4.0000 mg | Freq: Once | INTRAVENOUS | Status: AC
  Administered 2024-11-26: 4 mg via INTRAVENOUS
  Filled 2024-11-26: qty 1

## 2024-11-26 MED ORDER — CARVEDILOL 12.5 MG PO TABS
25.0000 mg | ORAL_TABLET | Freq: Two times a day (BID) | ORAL | Status: DC
  Administered 2024-11-26 – 2024-11-29 (×7): 25 mg via ORAL
  Filled 2024-11-26 (×7): qty 2

## 2024-11-26 MED ORDER — PERFLUTREN LIPID MICROSPHERE
1.0000 mL | INTRAVENOUS | Status: AC | PRN
  Administered 2024-11-26: 2 mL via INTRAVENOUS

## 2024-11-26 NOTE — Consult Note (Signed)
 PHARMACY - ANTICOAGULATION CONSULT NOTE  Pharmacy Consult for heparin  infusion Indication: ACS/STEMI  Allergies[1]  Patient Measurements: Weight: 74.4 kg (164 lb 0.4 oz)HDW 76.6 kg  Vital Signs: Temp: 97.7 F (36.5 C) (12/19 0801) Temp Source: Axillary (12/19 0801) BP: 163/110 (12/19 0816) Pulse Rate: 86 (12/19 0816)  Labs: Recent Labs    11/25/24 0952 11/25/24 1925 11/26/24 0506 11/26/24 0758  HGB  --  12.7* 10.7*  --   HCT  --  39.8 33.8*  --   PLT  --  290 247  --   APTT  --  29  --   --   LABPROT  --  13.2  --   --   INR  --  0.9  --   --   HEPARINUNFRC  --   --   --  0.58  CREATININE 4.05* 4.05* 4.37*  --     Estimated Creatinine Clearance: 17 mL/min (A) (by C-G formula based on SCr of 4.37 mg/dL (H)).   Medical History: Past Medical History:  Diagnosis Date   Acute alcoholic pancreatitis    Arthritis    Chronic pain    Depression    Diabetes mellitus without complication (HCC)    Essential hypertension, benign    GERD (gastroesophageal reflux disease)    Headache    HTN (hypertension) 11/27/2015   Hyperlipidemia    Peripheral vascular disease    Sleep apnea    could not tolerate CPAP   Stroke (HCC)    2016    Medications:  No home anticoagulants per pharmacist review  Assessment: 58 yo male presented to ED with complaint of shortness of breath and weakness.  Troponin found to be elevated.  Pharmacy consulted to initiate heparin  infusion.  HL 0.58- therapeutic CBC WNL Trop 1298  Goal of Therapy:  Heparin  level 0.3-0.7 units/ml Monitor platelets by anticoagulation protocol: Yes   Plan: Continue heparin  infusion at 1000 units/hr Check anti-Xa level in 6-8 hours and daily Continue to monitor H&H and platelets   Elspeth Sour, PharmD Clinical Pharmacist 11/26/2024 8:38 AM       [1]  Allergies Allergen Reactions   Ibuprofen  Other (See Comments)    Avoid per PCP   Oxcarbazepine Other (See Comments)    Patient goes out of right  state of mind.

## 2024-11-26 NOTE — Progress Notes (Addendum)
 Consultation Progress Note   Patient: Taylor Obrien FMW:[id number] DOB: [birth year] DOA: 11/25/2024 DOS: the patient was seen and examined on 11/26/2024 Primary service: Rebecka Oelkers, Landon BRAVO, MD  Brief hospital course:  Taylor Obrien is a 58 y.o. male with medical history significant for peripheral artery disease, history of CVA, wheelchair dependent since 2016, hypertension, type 2 diabetes, hyperlipidemia, chronic HFrEF, CKD 4, history of alcohol abuse and alcoholic pancreatitis, major depressive disorder, GERD, OSA intolerant of CPAP, who presented to the ER due to intermittent moderate to severe chest pain, sharp, centrally located, and radiating to his back.  Lasting for hours.  Associated with shortness of breath.  Symptoms started a few days ago, however worsened a few hours prior to presentation. At the ER,he was  severely hypertensive 264/155, tachycardic and tachypneic with increased work of breathing.  The patient was placed on BiPAP.  Chest x-ray revealed worsening congestive heart failure and small left pleural effusion.   EKG showed no acute ST wave changes, his troponins were elevated-1000--1200.  Elevated BNP greater than 5800.   He received a dose of IV Lasix  80 mg x 1.   Nitro drip and heparin  drip were initiated in the ER.  Patient was admitted for NSTEMI and acute on chronic HFrEF.    Assessment and Plan: Acute hypoxic and hypercarbic respiratory failure, respiratory distress secondary to pulmonary edema Patient is on 4 L of nasal cannula, room air at baseline. He has been weaned off BiPAP. Continue IV diuresis. Wean oxygen  down as tolerated.   NSTEMI EKG shows no acute ST-T wave changes Troponin trend 1086-- 1298, with a delta Continue heparin  drip Follow-up repeat transthoracic echocardiogram Cardiology consulted   Acute on chronic chronic HFrEF Presented with proBNP greater than 5800 and pulmonary edema seen on CXR Last 2D echo done on 10/31/2024 revealed LVEF  30% Repeat echo pending Chest x-ray showing cardiomegaly and worsening pulmonary edema,  Continue to diurese as tolerated. -Serum creatinine with diuresis Closely monitor strict I's and O's and daily weight.   Hypertensive crisis Presented with severely elevated blood pressures BP as high as 264/155 improved with nitro drip. As needed IV Lopressor  with parameters. Continue to closely monitor vital signs.   Peripheral artery disease History of CVA Resume home 81 mg aspirin  daily and Plavix  75 mg daily Resume home Lipitor  80 mg daily   CKD 4 Creatinine 4.0 with GFR of 16 Avoid nephrotoxic agents, dehydration, and hypotension Consulted nephrology for assistance while diuresis is ongoing   High anion gap metabolic acidosis in the setting of CKD 4 Resolved    Type 2 diabetes with hyperglycemia Last hemoglobin A1c 5.8 on 10/31/2024 from 6.7 on 01/30/2024. Resumed on a diet, fluid restriction, diabetic diet   Hyperlipidemia Resume home regimen.   Mild leukocytosis, rule out active infective process WBC 12.4 K Afebrile and nontoxic-appearing. Repeat CBC with differentials   History of CVA, wheelchair dependent Resume home regimen Fall precautions.     Critical care time: 65 minutes.     DVT prophylaxis: Heparin  drip.   Code Status: Full code.  TRH will continue to follow the patient.  Subjective: Patient was seen at bedside this morning, he states that he feels much better compared to time of admission.  He denies chest pain shortness of breath, nausea vomiting abdominal pain, fevers or chills.  He is laying in bed in no acute distress.  Blood pressure remains suboptimally controlled despite being maxed out on nitro.  Patient reports that he has longstanding  history of hypertension, he does not check his BP at home.  He recently got a primary care physician and does not recall what his blood pressure runs at the office.  Physical Exam: Vitals:   11/26/24 1136  11/26/24 1138 11/26/24 1200 11/26/24 1300  BP:  (!) 149/94 (!) 155/95 (!) 137/92  Pulse:  83 90 86  Resp:  (!) 27 17 14   Temp: 98.3 F (36.8 C)     TempSrc: Oral     SpO2:  98% 98% 96%  Weight:       General  No acute Distress Eyes: PERRL, lids and conjunctivae normal ENMT: Mucous membranes are moist.   Neck: normal, supple, no masses, no thyromegaly Respiratory: clear to auscultation bilaterally, no wheezing, no crackles. Normal respiratory effort. No accessory muscle use.  Cardiovascular: Regular rate and rhythm, no murmurs / rubs / gallops Abdomen: Soft, nontender nondistended. Musculoskeletal: no clubbing / cyanosis. No joint deformity upper and lower extremities.  Skin: no rashes, lesions, ulcers. No induration Neurologic: Facial asymmetry, moving extremity spontaneously, speech fluent. Psychiatric: Normal judgment and insight. Alert and oriented x 3. Normal mood.   Data Reviewed:    CBC    Component Value Date/Time   WBC 12.4 (H) 11/26/2024 0506   RBC 3.39 (L) 11/26/2024 0506   HGB 10.7 (L) 11/26/2024 0506   HCT 33.8 (L) 11/26/2024 0506   PLT 247 11/26/2024 0506   MCV 99.7 11/26/2024 0506   MCH 31.6 11/26/2024 0506   MCHC 31.7 11/26/2024 0506   RDW 14.1 11/26/2024 0506   LYMPHSABS 1.8 11/25/2024 1925   MONOABS 0.6 11/25/2024 1925   EOSABS 0.1 11/25/2024 1925   BASOSABS 0.0 11/25/2024 1925   CMP     Component Value Date/Time   NA 140 11/26/2024 0506   NA 140 11/25/2024 0952   K 6.2 (H) 11/26/2024 0506   CL 107 11/26/2024 0506   CO2 24 11/26/2024 0506   GLUCOSE 109 (H) 11/26/2024 0506   BUN 42 (H) 11/26/2024 0506   BUN 37 (H) 11/25/2024 0952   CREATININE 4.37 (H) 11/26/2024 0506   CALCIUM  8.3 (L) 11/26/2024 0506   PROT 5.9 (L) 11/26/2024 0506   ALBUMIN 3.1 (L) 11/26/2024 0506   AST 37 11/26/2024 0506   ALT 12 11/26/2024 0506   ALKPHOS 60 11/26/2024 0506   BILITOT 0.2 11/26/2024 0506   EGFR 16 (L) 11/25/2024 0952   GFRNONAA 15 (L) 11/26/2024 0506     Family Communication: Patient was updated at bedside.  Time spent: 36 minutes.  Author: Landon FORBES Baller, MD 11/26/2024 1:39 PM  For on call review [email or web address].

## 2024-11-26 NOTE — Progress Notes (Signed)
" ° °  Brief Progress Note   _____________________________________________________________________________________________________________  Patient Name: Taylor Obrien. Ingman Patient DOB: 1966/10/21 Date: @TODAY @      Data: 58 year old male currently admitted at [facility] in the ICU awaiting transfer to [facility] for admission.   Action: Contacted Dr. Carlota regarding clarification whether he still requires a bed at [facility].   Response:  Awaiting Dr. Wells response regarding whether Mr. Spiering still requires a bed at [facility].  _____________________________________________________________________________________________________________  [facility] RN Expeditor Guneet Delpino S Kiwana Deblasi Please contact us  directly via secure chat (search for [facility]) or by calling us  at [phone] [facility]).  "

## 2024-11-26 NOTE — Plan of Care (Signed)

## 2024-11-26 NOTE — Consult Note (Signed)
 PHARMACY - ANTICOAGULATION CONSULT NOTE  Pharmacy Consult for heparin  infusion Indication: ACS/STEMI  Allergies[1]  Patient Measurements: Weight: 74.4 kg (164 lb 0.4 oz)HDW 76.6 kg  Vital Signs: Temp: 98.3 F (36.8 C) (12/19 1136) Temp Source: Oral (12/19 1136) BP: 149/103 (12/19 1500) Pulse Rate: 75 (12/19 1500)  Labs: Recent Labs    11/25/24 0952 11/25/24 1925 11/26/24 0506 11/26/24 0758 11/26/24 1532  HGB  --  12.7* 10.7*  --   --   HCT  --  39.8 33.8*  --   --   PLT  --  290 247  --   --   APTT  --  29  --   --   --   LABPROT  --  13.2  --   --   --   INR  --  0.9  --   --   --   HEPARINUNFRC  --   --   --  0.58 0.53  CREATININE 4.05* 4.05* 4.37*  --   --     Estimated Creatinine Clearance: 17 mL/min (A) (by C-G formula based on SCr of 4.37 mg/dL (H)).   Medical History: Past Medical History:  Diagnosis Date   Acute alcoholic pancreatitis    Arthritis    Chronic pain    Depression    Diabetes mellitus without complication (HCC)    Essential hypertension, benign    GERD (gastroesophageal reflux disease)    Headache    HTN (hypertension) 11/27/2015   Hyperlipidemia    Peripheral vascular disease    Sleep apnea    could not tolerate CPAP   Stroke (HCC)    2016    Medications:  No home anticoagulants per pharmacist review  Assessment: 58 yo male presented to ED with complaint of shortness of breath and weakness.  Troponin found to be elevated.  Pharmacy consulted to initiate heparin  infusion.  HL 0.53- therapeutic CBC WNL Trop 1298  Goal of Therapy:  Heparin  level 0.3-0.7 units/ml Monitor platelets by anticoagulation protocol: Yes   Plan: Continue heparin  infusion at 1000 units/hr Check anti-Xa level  daily Continue to monitor H&H and platelets   Elspeth Sour, PharmD Clinical Pharmacist 11/26/2024 5:16 PM        [1]  Allergies Allergen Reactions   Ibuprofen  Other (See Comments)    Avoid per PCP   Oxcarbazepine Other (See  Comments)    Patient goes out of right state of mind.

## 2024-11-26 NOTE — Plan of Care (Signed)
  Problem: Coping: Goal: Level of anxiety will decrease Outcome: Progressing   Problem: Safety: Goal: Ability to remain free from injury will improve Outcome: Progressing   Problem: Coping: Goal: Ability to adjust to condition or change in health will improve Outcome: Progressing   

## 2024-11-26 NOTE — Progress Notes (Signed)
 Patient transported from ED 2 to ICU bed 12 on BIPAP. No adverse events noted. Report given to ICU RRT.

## 2024-11-26 NOTE — Progress Notes (Signed)
" °  Echocardiogram 2D Echocardiogram has been performed.  Devora Ellouise SAUNDERS 11/26/2024, 1:18 PM "

## 2024-11-26 NOTE — TOC Initial Note (Signed)
 Transition of Care [facility]) - Initial/Assessment Note    Patient Details  Name: Taylor Obrien MRN: [id number] Date of Birth: 1966-02-01  Transition of Care [facility]) CM/SW Contact:    Hoy DELENA Bigness, LCSW Phone Number: 11/26/2024, 11:33 AM  Clinical Narrative:                 Pt high risk for readmission. Met with pt at bedside to complete assessment. Pt is from home with family. Pt is wheelchair bound at baseline but, is independent with ADL's and transfers. Pt is able to drive self to appointments. Pt has a home care aide M-F 8hrs per day. Pt is not on oxygen  at baseline. ICM will continue to follow for discharge planning.   Expected Discharge Plan: Home/Self Care Barriers to Discharge: Continued Medical Work up   Patient Goals and CMS Choice Patient states their goals for this hospitalization and ongoing recovery are:: To return home CMS Medicare.gov Compare Post Acute Care list provided to:: Patient Choice offered to / list presented to : Patient      Expected Discharge Plan and Services In-house Referral: Clinical Social Work Discharge Planning Services: NA Post Acute Care Choice: NA Living arrangements for the past 2 months: Single Family Home                                      Prior Living Arrangements/Services Living arrangements for the past 2 months: Single Family Home Lives with:: Adult Children Patient language and need for interpreter reviewed:: Yes Do you feel safe going back to the place where you live?: Yes      Need for Family Participation in Patient Care: No (Comment) Care giver support system in place?: Yes (comment) Current home services: DME, Homehealth aide (wheelchair; 40hr wk home aide) Criminal Activity/Legal Involvement Pertinent to Current Situation/Hospitalization: No - Comment as needed  Activities of Daily Living   ADL Screening (condition at time of admission) Independently performs ADLs?: Yes (appropriate for developmental  age) Is the patient deaf or have difficulty hearing?: No Does the patient have difficulty seeing, even when wearing glasses/contacts?: No Does the patient have difficulty concentrating, remembering, or making decisions?: No  Permission Sought/Granted Permission sought to share information with : Family Supports Permission granted to share information with : Yes, Verbal Permission Granted  Share Information with NAME: Valeria, Krisko  Daughter, Emergency Contact  [phone]           Emotional Assessment Appearance:: Appears stated age Attitude/Demeanor/Rapport: Engaged Affect (typically observed): Accepting, Pleasant Orientation: : Oriented to Self, Oriented to Place, Oriented to  Time, Oriented to Situation Alcohol / Substance Use: Not Applicable Psych Involvement: No (comment)  Admission diagnosis:  Acute hypoxemic respiratory failure (HCC) [J96.01] Patient Active Problem List   Diagnosis Date Noted   Acute hypoxemic respiratory failure (HCC) 11/25/2024   Acute combined systolic and diastolic CHF, NYHA class 3 (HCC) 11/01/2024   Hypertensive emergency 10/31/2024   Suicidal ideation 04/21/2024   MDD (major depressive disorder), recurrent severe, 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   Intentional diphenhydramine  overdose (HCC) 04/19/2024   Pancreatic duct dilated 01/31/2024   Hypertensive urgency 01/30/2024   Chronic kidney disease, stage 3b (HCC) 01/30/2024   Type 2 diabetes mellitus with hyperglycemia (HCC) 01/30/2024   History of right ACA stroke 01/30/2024   Anxiety 01/30/2024   Fall at home, initial encounter 06/18/2023   AKI (acute kidney injury) 06/18/2023   Acute  pancreatitis 07/19/2022   GERD (gastroesophageal reflux disease)    Iron deficiency anemia    Constipation    Abdominal pain 06/14/2022   Nausea and vomiting 06/14/2022   Elevated lipase 06/14/2022   Hypoalbuminemia due to protein-calorie malnutrition 06/14/2022   Stage 3a chronic kidney disease (CKD) (HCC)  06/04/2022   Anemia    Acute pancreatitis without infection or necrosis    Transaminasemia 04/29/2022   Class 1 obesity 04/29/2022   Thrombocytosis 04/26/2022   Acute alcoholic pancreatitis 04/26/2022   Diabetes mellitus (HCC) 04/24/2022   Post-operative state 01/24/2018   Spastic hemiplegia affecting nondominant side (HCC) 06/03/2016   Dysuria    OSA (obstructive sleep apnea)    Hypokalemia    Elevated blood pressure    Hemiparesis affecting left side as late effect of stroke (HCC)    Epistaxis    HTN (hypertension) 11/27/2015   Acute renal failure superimposed on stage 3a chronic kidney disease (HCC) 11/27/2015   Hemiplegia and hemiparesis following unspecified cerebrovascular disease affecting left non-dominant side (HCC) 11/23/2015   Gait disturbance, post-stroke 11/23/2015   Cerebrovascular accident (CVA) due to occlusion of right anterior cerebral artery (HCC)    Essential hypertension    Depression    Chronic pain syndrome    ETOH abuse    Marijuana abuse    Cerebrovascular accident (CVA) due to thrombosis of right carotid artery (HCC)    Malignant hypertension    Mixed hyperlipidemia    Cerebral infarction (HCC) 11/18/2015   Stroke (cerebrum) (HCC) 11/18/2015   Chest pain 10/09/2012   Chronic back pain 10/09/2012   Tobacco abuse 10/09/2012   Hypertensive heart disease 08/31/2012   Accelerated hypertension 08/31/2012   Precordial pain 08/31/2012   PCP:  Trudy Ave, NP Pharmacy:   [facility] - [place], [place] - [address] [place] [address] Phone: [phone] Fax: [phone]     Social Drivers of Health (SDOH) Social History: SDOH Screenings   Food Insecurity: Food Insecurity Present (11/26/2024)  Housing: Low Risk (11/26/2024)  Transportation Needs: No Transportation Needs (11/26/2024)  Utilities: At Risk (11/26/2024)  Alcohol Screen: High Risk (04/21/2024)  Tobacco Use: High Risk (11/25/2024)   SDOH  Interventions:     Readmission Risk Interventions    11/26/2024   11:27 AM 04/26/2022   11:38 AM 04/25/2022    9:38 AM  Readmission Risk Prevention Plan  Transportation Screening Complete Complete Complete  Home Care Screening   Complete  Medication Review (RN CM)   Complete  HRI or Home Care Consult  Complete   Social Work Consult for Recovery Care Planning/Counseling  Complete   Palliative Care Screening  Not Applicable   Medication Review Oceanographer) Complete Complete   PCP or Specialist appointment within 3-5 days of discharge Complete    HRI or Home Care Consult Complete    SW Recovery Care/Counseling Consult Complete    Palliative Care Screening Not Applicable    Skilled Nursing Facility Not Applicable

## 2024-11-27 DIAGNOSIS — I214 Non-ST elevation (NSTEMI) myocardial infarction: Secondary | ICD-10-CM

## 2024-11-27 DIAGNOSIS — R7989 Other specified abnormal findings of blood chemistry: Secondary | ICD-10-CM | POA: Diagnosis not present

## 2024-11-27 DIAGNOSIS — J9601 Acute respiratory failure with hypoxia: Secondary | ICD-10-CM | POA: Diagnosis not present

## 2024-11-27 DIAGNOSIS — N184 Chronic kidney disease, stage 4 (severe): Secondary | ICD-10-CM | POA: Diagnosis not present

## 2024-11-27 DIAGNOSIS — I5023 Acute on chronic systolic (congestive) heart failure: Secondary | ICD-10-CM | POA: Diagnosis not present

## 2024-11-27 DIAGNOSIS — I161 Hypertensive emergency: Secondary | ICD-10-CM

## 2024-11-27 LAB — CBC
HCT: 27 % — ABNORMAL LOW (ref 39.0–52.0)
Hemoglobin: 8.8 g/dL — ABNORMAL LOW (ref 13.0–17.0)
MCH: 31.9 pg (ref 26.0–34.0)
MCHC: 32.6 g/dL (ref 30.0–36.0)
MCV: 97.8 fL (ref 80.0–100.0)
Platelets: 192 K/uL (ref 150–400)
RBC: 2.76 MIL/uL — ABNORMAL LOW (ref 4.22–5.81)
RDW: 14.2 % (ref 11.5–15.5)
WBC: 8.2 K/uL (ref 4.0–10.5)
nRBC: 0 % (ref 0.0–0.2)

## 2024-11-27 LAB — GLUCOSE, CAPILLARY
Glucose-Capillary: 106 mg/dL — ABNORMAL HIGH (ref 70–99)
Glucose-Capillary: 127 mg/dL — ABNORMAL HIGH (ref 70–99)
Glucose-Capillary: 131 mg/dL — ABNORMAL HIGH (ref 70–99)
Glucose-Capillary: 131 mg/dL — ABNORMAL HIGH (ref 70–99)
Glucose-Capillary: 90 mg/dL (ref 70–99)

## 2024-11-27 LAB — BLOOD GAS, VENOUS
Acid-base deficit: 3.3 mmol/L — ABNORMAL HIGH (ref 0.0–2.0)
Bicarbonate: 21.3 mmol/L (ref 20.0–28.0)
Drawn by: 7049
O2 Saturation: 97.7 %
Patient temperature: 36.7
pCO2, Ven: 36 mmHg — ABNORMAL LOW (ref 44–60)
pH, Ven: 7.38 (ref 7.25–7.43)
pO2, Ven: 77 mmHg — ABNORMAL HIGH (ref 32–45)

## 2024-11-27 LAB — HEPARIN LEVEL (UNFRACTIONATED): Heparin Unfractionated: 0.48 [IU]/mL (ref 0.30–0.70)

## 2024-11-27 LAB — TROPONIN T, HIGH SENSITIVITY: Troponin T High Sensitivity: 1368 ng/L (ref 0–19)

## 2024-11-27 MED ORDER — BUMETANIDE 0.25 MG/ML IJ SOLN
2.0000 mg | Freq: Two times a day (BID) | INTRAMUSCULAR | Status: DC
  Administered 2024-11-27: 2 mg via INTRAVENOUS
  Filled 2024-11-27 (×3): qty 8

## 2024-11-27 MED ORDER — FUROSEMIDE 10 MG/ML IJ SOLN
60.0000 mg | Freq: Two times a day (BID) | INTRAMUSCULAR | Status: DC
  Administered 2024-11-27 – 2024-11-29 (×4): 60 mg via INTRAVENOUS
  Filled 2024-11-27 (×4): qty 6

## 2024-11-27 NOTE — Plan of Care (Signed)

## 2024-11-27 NOTE — Consult Note (Signed)
 PHARMACY - ANTICOAGULATION CONSULT NOTE  Pharmacy Consult for heparin  infusion Indication: ACS/STEMI  Allergies[1]  Patient Measurements: Height: 5' 6 (167.6 cm) Weight: 74.5 kg (164 lb 3.9 oz) IBW/kg (Calculated) : 63.8HDW 76.6 kg  Vital Signs: Temp: 98.3 F (36.8 C) (12/20 0757) Temp Source: Oral (12/20 0757)  Labs: Recent Labs    11/25/24 1925 11/26/24 0506 11/26/24 0758 11/26/24 1532 11/26/24 1806 11/27/24 0401  HGB 12.7* 10.7*  --   --   --  8.8*  HCT 39.8 33.8*  --   --   --  27.0*  PLT 290 247  --   --   --  192  APTT 29  --   --   --   --   --   LABPROT 13.2  --   --   --   --   --   INR 0.9  --   --   --   --   --   HEPARINUNFRC  --   --  0.58 0.53  --  0.48  CREATININE 4.05* 4.37*  --   --  4.59*  --     Estimated Creatinine Clearance: 16.2 mL/min (A) (by C-G formula based on SCr of 4.59 mg/dL (H)).   Medical History: Past Medical History:  Diagnosis Date   Acute alcoholic pancreatitis    Arthritis    Chronic pain    Depression    Diabetes mellitus without complication (HCC)    Essential hypertension, benign    GERD (gastroesophageal reflux disease)    Headache    HTN (hypertension) 11/27/2015   Hyperlipidemia    Peripheral vascular disease    Sleep apnea    could not tolerate CPAP   Stroke (HCC)    2016    Medications:  No home anticoagulants per pharmacist review  Assessment: 58 yo male presented to ED with complaint of shortness of breath and weakness.  Troponin found to be elevated.  Pharmacy consulted to initiate heparin  infusion.  HL 0.48- therapeutic CBC WNL Trop 1298  Goal of Therapy:  Heparin  level 0.3-0.7 units/ml Monitor platelets by anticoagulation protocol: Yes   Plan: Continue heparin  infusion at 1000 units/hr Check anti-Xa level  daily Continue to monitor H&H and platelets   Elspeth Sour, PharmD Clinical Pharmacist 11/27/2024 8:41 AM         [1]  Allergies Allergen Reactions   Ibuprofen  Other (See  Comments)    Avoid per PCP   Oxcarbazepine Other (See Comments)    Patient goes out of right state of mind.

## 2024-11-27 NOTE — Progress Notes (Signed)
 Date and time results received: 11/27/2024 1048 (use smartphrase .now to insert current time)  Test: Troponin Critical Value: 1368  Name of Provider Notified: Dr. Arlon  Orders Received? Or Actions Taken?:

## 2024-11-27 NOTE — Hospital Course (Signed)
 58 y.o. male with medical history significant for peripheral artery disease, history of CVA, wheelchair dependent since 2016, hypertension, type 2 diabetes, hyperlipidemia, chronic HFrEF, CKD 4, history of alcohol abuse and alcoholic pancreatitis, major depressive disorder, GERD, OSA intolerant of CPAP, who presented to the ER due to intermittent moderate to severe chest pain, sharp, centrally located, and radiating to his back.    Assessment and Plan:   Acute hypoxic respiratory failure - Continues on 4 L nasal cannula.  Patient does not wear O2 at baseline.  Continue to wean O2 as tolerated.   Acute exacerbation of chronic HFrEF - Elevated proBNP, hypoxia, edema noted on chest x-ray.  Echo noting EF 30-35%.  Responded well to initial diuresis.  Will restart IV Lasix  60 mg every 12 hours.  Monitor O2 as above.   NSTEMI - Troponins elevated with only mild upward trend, 1086, 1298, 1368.  Likely ongoing demand ischemia in the setting of CKD4.  However concern for underlying ischemic disease as well.  Currently on heparin  drip.  Aspirin , Plavix , statin already on board.   Hypertensive emergency - Reportedly presented with BP as high as 264/155.  Likely underlying etiology of above.  Blood pressure showing improvement.  Continue to monitor closely.   CKD4 - Creatinine appears to be around baseline in the fours.  Will monitor urine output and recheck BMP in AM.   Hyperkalemia - Potassium 6.2 yesterday, now improved to 4.1.  Will recheck potassium in AM.   Physical debilitation muscle weakness/history of CVA/wheelchair dependent - Fall precautions on board.

## 2024-11-27 NOTE — Plan of Care (Signed)
   Problem: Education: Goal: Knowledge of General Education information will improve Description: Including pain rating scale, medication(s)/side effects and non-pharmacologic comfort measures Outcome: Progressing   Problem: Health Behavior/Discharge Planning: Goal: Ability to manage health-related needs will improve Outcome: Progressing   Problem: Clinical Measurements: Goal: Will remain free from infection Outcome: Progressing

## 2024-11-27 NOTE — Progress Notes (Signed)
 " Progress Note   Patient: Taylor Obrien FMW:[id number] DOB: 14-Dec-1965 DOA: 11/25/2024  DOS: the patient was seen and examined on 11/27/2024   Brief hospital course:  58 y.o. male with medical history significant for peripheral artery disease, history of CVA, wheelchair dependent since 2016, hypertension, type 2 diabetes, hyperlipidemia, chronic HFrEF, CKD 4, history of alcohol abuse and alcoholic pancreatitis, major depressive disorder, GERD, OSA intolerant of CPAP, who presented to the ER due to intermittent moderate to severe chest pain, sharp, centrally located, and radiating to his back.   Assessment and Plan:  Acute hypoxic respiratory failure - Continues on 4 L nasal cannula.  Patient does not wear O2 at baseline.  Continue to wean O2 as tolerated.  Acute exacerbation of chronic HFrEF - Elevated proBNP, hypoxia, edema noted on chest x-ray.  Echo noting EF 30-35%.  Responded well to initial diuresis.  Will restart IV Lasix  60 mg every 12 hours.  Monitor O2 as above.  NSTEMI - Troponins elevated with only mild upward trend, 1086, 1298, 1368.  Likely ongoing demand ischemia in the setting of CKD4.  However concern for underlying ischemic disease as well.  Currently on heparin  drip.  Aspirin , Plavix , statin already on board.  Hypertensive emergency - Reportedly presented with BP as high as 264/155.  Likely underlying etiology of above.  Blood pressure showing improvement.  Continue to monitor closely.  CKD4 - Creatinine appears to be around baseline in the fours.  Will monitor urine output and recheck BMP in AM.  Hyperkalemia - Potassium 6.2 yesterday, now improved to 4.1.  Will recheck potassium in AM.  Physical debilitation muscle weakness/history of CVA/wheelchair dependent - Fall precautions on board.   Subjective: Patient sitting up in bed this morning, states he feels improved from yesterday.  Denies any chest pain, worsening shortness of breath, fever, chills, nausea,  vomiting, abdominal pain.  Physical Exam:  Vitals:   11/27/24 0049 11/27/24 0500 11/27/24 0757 11/27/24 1156  BP:      Pulse:      Resp:      Temp: 98.1 F (36.7 C)  98.3 F (36.8 C) 98.4 F (36.9 C)  TempSrc: Oral  Oral Oral  SpO2:      Weight:  74.5 kg    Height:        GENERAL:  Alert, pleasant, no acute distress, disheveled HEENT:  EOMI, nasal cannula CARDIOVASCULAR:  RRR, no murmurs appreciated RESPIRATORY: Mild basilar crackles appreciated GASTROINTESTINAL:  Soft, nontender, nondistended EXTREMITIES: Trace LE edema bilaterally NEURO:  No new focal deficits appreciated SKIN:  No rashes noted PSYCH:  Appropriate mood and affect     Data Reviewed:  Imaging Studies: ECHOCARDIOGRAM LIMITED Result Date: 11/26/2024    ECHOCARDIOGRAM LIMITED REPORT   Patient Name:   Taylor Obrien Date of Exam: 11/26/2024 Medical Rec #:  [id number]     Height:       66.0 in Accession #:    [id number]    Weight:       164.0 lb Date of Birth:  08-30-1966     BSA:          1.838 m Patient Age:    58 years      BP:           163/110 mmHg Patient Gender: M             HR:           85 bpm. Exam Location:  Inpatient Procedure: Limited Echo, Cardiac  Doppler, Color Doppler and Intracardiac            Opacification Agent (Both Spectral and Color Flow Doppler were            utilized during procedure). Indications:    122-I22.9 Subsequent ST elevation (STEM) and non-ST elevation                 (NSTEMI) myocardial infarction  History:        Patient has prior history of Echocardiogram examinations, most                 recent 10/31/2024. Stroke, Signs/Symptoms:Shortness of Breath,                 Dyspnea, Chest Pain and Altered Mental Status; Risk                 Factors:Current Smoker, Sleep Apnea, Diabetes, Dyslipidemia and                 Hypertension. ETOH.  Sonographer:    Ellouise Mose RDCS Referring Phys: [id number] CAROLE N HALL  Sonographer Comments: Technically difficult study due to poor echo windows.  IMPRESSIONS  1. Limited study.  2. Left ventricular ejection fraction, by estimation, is 30 to 35%. Left ventricular ejection fraction by 2D MOD biplane is 31.4 %. The left ventricle has moderately decreased function. The left ventricle demonstrates regional wall motion abnormalities (see scoring diagram/findings for description). There is moderate asymmetric left ventricular hypertrophy of the septal segment. Left ventricular diastolic function could not be evaluated. The average left ventricular global longitudinal strain is -12.1 %. The global longitudinal strain is abnormal.  3. Right ventricular systolic function is normal. The right ventricular size is normal. Tricuspid regurgitation signal is inadequate for assessing PA pressure.  4. The mitral valve is grossly normal. Mild mitral valve regurgitation.  5. The aortic valve was not well visualized.  6. The inferior vena cava is normal in size with greater than 50% respiratory variability, suggesting right atrial pressure of 3 mmHg. Comparison(s): Prior images reviewed side by side. LVEF 30-35% range at this point, similar wall motion abnormalties were present on the prior study as well. Mild mitral regurgitation. FINDINGS  Left Ventricle: Left ventricular ejection fraction, by estimation, is 30 to 35%. Left ventricular ejection fraction by 2D MOD biplane is 31.4 %. The left ventricle has moderately decreased function. The left ventricle demonstrates regional wall motion abnormalities. Definity  contrast agent was given IV to delineate the left ventricular endocardial borders. The average left ventricular global longitudinal strain is -12.1 %. Strain was performed and the global longitudinal strain is abnormal. The left ventricular internal cavity size was normal in size. There is moderate asymmetric left ventricular hypertrophy of the septal segment. Left ventricular diastolic function could not be evaluated.  LV Wall Scoring: The basal inferolateral segment,  basal anterolateral segment, and basal inferior segment are akinetic. The entire anterior wall, mid and distal lateral wall, entire septum, entire apex, mid and distal inferior wall, and mid anterolateral segment are hypokinetic. Right Ventricle: The right ventricular size is normal. No increase in right ventricular wall thickness. Right ventricular systolic function is normal. Tricuspid regurgitation signal is inadequate for assessing PA pressure. Pericardium: There is no evidence of pericardial effusion. Mitral Valve: The mitral valve is grossly normal. Mild mitral valve regurgitation. Tricuspid Valve: The tricuspid valve is grossly normal. Tricuspid valve regurgitation is trivial. No evidence of tricuspid stenosis. Aortic Valve: The aortic valve was not well visualized. Aorta:  The aortic root and ascending aorta are structurally normal, with no evidence of dilitation. Venous: The inferior vena cava is normal in size with greater than 50% respiratory variability, suggesting right atrial pressure of 3 mmHg. Additional Comments: Spectral Doppler performed. Color Doppler performed.  LEFT VENTRICLE PLAX 2D                        Biplane EF (MOD) LVIDd:         4.80 cm         LV Biplane EF:   Left LVIDs:         4.30 cm                          ventricular LV PW:         1.40 cm                          ejection LV IVS:        1.70 cm                          fraction by                                                 2D MOD                                                 biplane is LV Volumes (MOD)                                31.4 %. LV vol d, MOD    119.0 ml A2C: LV vol d, MOD    95.7 ml       2D Longitudinal A4C:                           Strain LV vol s, MOD    77.0 ml       2D Strain GLS   -12.1 % A2C:                           Avg: LV vol s, MOD    66.2 ml A4C: LV SV MOD A2C:   42.0 ml LV SV MOD A4C:   95.7 ml LV SV MOD BP:    34.2 ml IVC IVC diam: 0.70 cm AORTIC VALVE LVOT Vmax:   106.00 cm/s LVOT Vmean:   64.500 cm/s LVOT VTI:    0.148 m  AORTA Ao Asc diam: 2.90 cm MITRAL VALVE MV Area (PHT): 4.49 cm     SHUNTS MV Decel Time: 169 msec     Systemic VTI: 0.15 m MV E velocity: 49.30 cm/s MV A velocity: 103.00 cm/s MV E/A ratio:  0.48 Jayson Sierras MD Electronically signed by Jayson Sierras MD Signature Date/Time: 11/26/2024/1:45:30 PM    Final    DG Chest Port 1 View Result Date: 11/25/2024 EXAM: 1 VIEW(S) XRAY OF THE CHEST 11/25/2024 07:32:09 PM  COMPARISON: 11/15/2024 CLINICAL HISTORY: SOB FINDINGS: LUNGS AND PLEURA: Increased central vascular congestion with diffuse parenchymal opacity bilaterally consistent with edema. Small left pleural effusion is noted. No pneumothorax. HEART AND MEDIASTINUM: Cardiomegaly. Increased central vascular congestion. BONES AND SOFT TISSUES: No acute osseous abnormality. IMPRESSION: 1. Worsening congestive heart failure 2. Small left pleural effusion. Electronically signed by: Oneil Devonshire MD 11/25/2024 07:34 PM EST RP Workstation: [id number]   CT CERVICAL SPINE WO CONTRAST Result Date: 11/15/2024 EXAM: CT CERVICAL SPINE WITHOUT CONTRAST 11/15/2024 07:22:07 PM TECHNIQUE: CT of the cervical spine was performed without the administration of intravenous contrast. Multiplanar reformatted images are provided for review. Automated exposure control, iterative reconstruction, and/or weight based adjustment of the mA/kV was utilized to reduce the radiation dose to as low as reasonably achievable. COMPARISON: CT spine 07/06/2018. CLINICAL HISTORY: Polytrauma, blunt. 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 No acute fracture or traumatic malalignment. DEGENERATIVE CHANGES: Multilevel mild-to-moderate degenerative changes of the spine. No associated severe osseous neural foraminal or central canal stenosis. SOFT TISSUES: No prevertebral soft tissue swelling. IMPRESSION: 1. No acute  abnormality of the cervical spine. 2. 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 Electronically signed by: Morgane Naveau MD 11/15/2024 07:48 PM EST RP Workstation: [id number]   CT T-SPINE NO CHARGE Result Date: 11/15/2024 EXAM: CT THORACIC SPINE WITHOUT CONTRAST 11/15/2024 07:22:07 PM TECHNIQUE: CT of the thoracic spine was performed without the administration of intravenous contrast. Multiplanar reformatted images are provided for review. Automated exposure control, iterative reconstruction, and/or weight based adjustment of the mA/kV was utilized to reduce the radiation dose to as low as reasonably achievable. COMPARISON: MRI lumbar spine 06/27/2024. CLINICAL HISTORY: FINDINGS: BONES AND ALIGNMENT: Normal vertebral body heights except for T12. Chronic stable T12 supra endplate mild compression fracture with associated Schmorl's node. No acute fracture or suspicious bone lesion. Normal alignment. DEGENERATIVE CHANGES: Multilevel mild-to-moderate anterior osteophyte formation. SOFT TISSUES: No acute abnormality. IMPRESSION: 1. No acute findings. 2. Chronic stable T12 superior endplate mild compression fracture. 3. Please see separately dictated CT , abdomen, pelvis, lumbar spine 11/15/2024. Electronically signed by: Morgane Naveau MD 11/15/2024 07:36 PM EST RP Workstation: [id number]   CT L-SPINE NO CHARGE Result Date: 11/15/2024 EXAM: CT OF THE LUMBAR SPINE WITHOUT CONTRAST 11/15/2024 07:22:07 PM TECHNIQUE: CT of the lumbar spine was performed without the administration of intravenous contrast. Multiplanar reformatted images are provided for review. Automated exposure control, iterative reconstruction, and/or weight based adjustment of the mA/kV was utilized to reduce the radiation dose to as low as reasonably achievable. COMPARISON: MRI lumbar spine 06/27/2024. CLINICAL HISTORY: Please see separately dictated CT thoracic spine 11/15/2024. FINDINGS: BONES AND ALIGNMENT: Normal  vertebral body heights. No acute fracture or suspicious bone lesion. Normal alignment. DEGENERATIVE CHANGES: Mild anterior osteophyte formation. SOFT TISSUES: No acute abnormality. IMPRESSION: 1. No acute findings. 2. Please see separately dictated CT chest, abdomen, pelvis, thoracic spine 11/15/2024. Electronically signed by: Morgane Naveau MD 11/15/2024 07:35 PM EST RP Workstation: [id number]   CT CHEST ABDOMEN PELVIS WO CONTRAST Result Date: 11/15/2024 EXAM: CT CHEST, ABDOMEN AND PELVIS WITHOUT CONTRAST 11/15/2024 07:22:07 PM TECHNIQUE: CT of the chest, abdomen and pelvis was performed without the administration of intravenous contrast. Multiplanar reformatted images are provided for review. Automated exposure control, iterative reconstruction, and/or weight based adjustment of the mA/kV was utilized to reduce the radiation dose to as low as reasonably achievable. COMPARISON: CT angio neurobiform 06/27/2024, CT abdomen and pelvis  04/29/2024. Please see separately dictated CT thoracolumbar spine 11/15/2024. CLINICAL HISTORY: Polytrauma, blunt. FINDINGS: CHEST: MEDIASTINUM AND LYMPH NODES: Heart and pericardium are unremarkable. The central airways are clear. No mediastinal, hilar or axillary lymphadenopathy.No gross hilar adenopathy with limited evaluation on this noncontrast study. 4-vessel coronary artery calcifications. LUNGS AND PLEURA: No focal consolidation or pulmonary edema. No pleural effusion or pneumothorax. ABDOMEN AND PELVIS: LIVER: The liver is unremarkable. GALLBLADDER AND BILE DUCTS: Gallbladder is unremarkable. No biliary ductal dilatation. SPLEEN: No acute abnormality. PANCREAS: No acute abnormality. ADRENAL GLANDS: No acute abnormality. KIDNEYS, URETERS AND BLADDER: No stones in the kidneys or ureters. No hydronephrosis. No perinephric or periureteral stranding. Urinary bladder is unremarkable. Calcifications associated with the kidneys are likely vascular. GI AND BOWEL: Stomach demonstrates  no acute abnormality. There is no bowel obstruction. No small or large bowel thickening or dilatation. The appendix is unremarkable. Colonic diverticulosis. Otherwise, the colon is unremarkable. REPRODUCTIVE ORGANS: No acute abnormality. PERITONEUM AND RETROPERITONEUM: No ascites. No free air. VASCULATURE: Aorta is normal in caliber. Severe atherosclerotic plaque of the abdominal aorta. Mild atherosclerotic plaque in the thoracic aorta. ABDOMINAL AND PELVIS LYMPH NODES: No lymphadenopathy. BONES AND SOFT TISSUES: No acute displaced rib fracture. No acute displaced fracture or dislocation of either shoulder or hips. No acute displaced fracture or diastasis of the bones of the pelvis. No acute sacral fracture. No large soft tissue hematoma. Please see separately dictated CT thoracolumbar spine 12.8.25 . No large soft tissue hematoma No focal soft tissue abnormality. IMPRESSION: 1. No acute findings related to trauma . 2. Please see separately dictated CT thoracolumbar spine 12.8.25 . 3. Other, non-acute and/or normal findings as above. Electronically signed by: Morgane Naveau MD 11/15/2024 07:34 PM EST RP Workstation: [id number]   CT HEAD WO CONTRAST Result Date: 11/15/2024 EXAM: CT HEAD WITHOUT CONTRAST 11/15/2024 07:22:07 PM TECHNIQUE: CT of the head was performed without the administration of intravenous contrast. Automated exposure control, iterative reconstruction, and/or weight based adjustment of the mA/kV was utilized to reduce the radiation dose to as low as reasonably achievable. COMPARISON: None available. CLINICAL HISTORY: Head trauma, moderate-severe FINDINGS: BRAIN AND VENTRICLES: No acute hemorrhage. No evidence of acute infarct. 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 Chronic stable lacunar infarcts in left basal ganglia and pons. Patchy and confluent supratentorial white matter hypodensities, likely representing advanced chronic microvascular ischemic changes. No  hydrocephalus. No extra-axial collection. No mass effect or midline shift. Atherosclerotic calcifications are present within the cavernous internal carotid arteries. ORBITS: No acute abnormality. SINUSES: No acute abnormality. SOFT TISSUES AND SKULL: No acute soft tissue abnormality. No skull fracture. IMPRESSION: 1. No acute intracranial abnormality related to head trauma. Electronically signed by: Morgane Naveau MD 11/15/2024 07:29 PM EST RP Workstation: [id number]   DG Pelvis Portable Result Date: 11/15/2024 CLINICAL DATA:  Clemens out of wheelchair, lower back pain EXAM: PORTABLE PELVIS 1-2 VIEWS COMPARISON:  None Available. FINDINGS: Frontal view of the pelvis includes both hips. No acute displaced fracture, subluxation, or dislocation. Mild symmetrical bilateral hip osteoarthritis. Soft tissues are unremarkable. IMPRESSION: 1. Symmetrical bilateral hip osteoarthritis. 2. No acute fracture. Electronically Signed   By: Ozell Daring M.D.   On: 11/15/2024 18:10   DG Chest Port 1 View Result Date: 11/15/2024 CLINICAL DATA:  Clemens out of wheelchair, lower back pain EXAM: PORTABLE CHEST 1 VIEW COMPARISON:  10/30/2024 FINDINGS: The heart size and mediastinal contours are within normal limits. Both lungs are clear. The visualized skeletal structures are unremarkable. IMPRESSION: No active  disease. Electronically Signed   By: Ozell Daring M.D.   On: 11/15/2024 18:09   US  RENAL Result Date: 10/31/2024 EXAM: US  Retroperitoneum Complete, Renal. 10/31/2024 10:51:00 AM TECHNIQUE: Real-time ultrasonography of the retroperitoneum renal was performed. COMPARISON: None available CLINICAL HISTORY: 409830 AKI (acute kidney injury) 409830 AKI (acute kidney injury) FINDINGS: FINDINGS: RIGHT KIDNEY/URETER: The right kidney measures 10.2 x 5.8 x 4.3 cm with a volume of 131.9 cc. Increased parenchymal echogenicity. No hydronephrosis. No calculus. No mass. LEFT KIDNEY/URETER: The left kidney measures 10.6 x 6.0 x 4.1 cm with a  volume of 137.6 cc. Increased parenchymal echogenicity. No hydronephrosis. No calculus. No mass. BLADDER: The bladder appears normal with visualization of both ureteral jets. IMPRESSION: 1. Increased bilateral renal parenchymal echogenicity, compatible with medical renal disease 2. No signs of hydronephrosis. Electronically signed by: Waddell Calk MD 10/31/2024 11:20 AM EST RP Workstation: [id number]   ECHOCARDIOGRAM COMPLETE Result Date: 10/31/2024    ECHOCARDIOGRAM REPORT   Patient Name:   ALBEIRO TROMPETER Date of Exam: 10/31/2024 Medical Rec #:  [id number]     Height:       66.0 in Accession #:    [id number]    Weight:       220.0 lb Date of Birth:  1966/11/24     BSA:          2.083 m Patient Age:    58 years      BP:           154/109 mmHg Patient Gender: M             HR:           104 bpm. Exam Location:  Inpatient Procedure: 2D Echo, Cardiac Doppler and Color Doppler (Both Spectral and Color            Flow Doppler were utilized during procedure). STAT ECHO Indications:    I50.31 Acute diastolic (congestive) heart failure  History:        Patient has prior history of Echocardiogram examinations, most                 recent 06/19/2023. Risk Factors:Hypertension, Dyslipidemia, ETOH,                 Diabetes and Sleep Apnea.  Sonographer:    Damien Senior RDCS Referring Phys: [id number] FONDA JAYSON SHARPS IMPRESSIONS  1. Left ventricular ejection fraction, by estimation, is 30%. Left ventricular ejection fraction by 2D MOD biplane is 29.1 %. The left ventricle has moderately decreased function. The left ventricle demonstrates regional wall motion abnormalities (see scoring diagram/findings for description). There is moderate asymmetric left ventricular hypertrophy of the septal segment. Indeterminate diastolic filling due to E-A fusion.  2. Right ventricular systolic function is normal. The right ventricular size is normal. Tricuspid regurgitation signal is inadequate for assessing PA pressure.  3. The mitral valve is  degenerative. Mild mitral valve regurgitation. No evidence of mitral stenosis.  4. The aortic valve was not well visualized. Aortic valve regurgitation is not visualized. No aortic stenosis is present.  5. The inferior vena cava is normal in size with greater than 50% respiratory variability, suggesting right atrial pressure of 3 mmHg. FINDINGS  Left Ventricle: Left ventricular ejection fraction, by estimation, is 30%. Left ventricular ejection fraction by 2D MOD biplane is 29.1 %. The left ventricle has moderately decreased function. The left ventricle demonstrates regional wall motion abnormalities. The left ventricular internal cavity size was normal in size. There is moderate asymmetric  left ventricular hypertrophy of the septal segment. Indeterminate diastolic filling due to E-A fusion.  LV Wall Scoring: The mid and distal lateral wall, posterior wall, and basal inferior segment are akinetic. The antero-lateral wall and mid inferior segment are hypokinetic. Right Ventricle: The right ventricular size is normal. Right vetricular wall thickness was not well visualized. Right ventricular systolic function is normal. Tricuspid regurgitation signal is inadequate for assessing PA pressure. Left Atrium: Left atrial size was normal in size. Right Atrium: Right atrial size was normal in size. Pericardium: There is no evidence of pericardial effusion. Mitral Valve: The mitral valve is degenerative in appearance. Mild mitral valve regurgitation. No evidence of mitral valve stenosis. Tricuspid Valve: The tricuspid valve is normal in structure. Tricuspid valve regurgitation is trivial. No evidence of tricuspid stenosis. Aortic Valve: The aortic valve was not well visualized. Aortic valve regurgitation is not visualized. No aortic stenosis is present. Pulmonic Valve: The pulmonic valve was not well visualized. Pulmonic valve regurgitation is not visualized. No evidence of pulmonic stenosis. Aorta: The aortic root is normal  in size and structure and the ascending aorta was not well visualized. Venous: The inferior vena cava is normal in size with greater than 50% respiratory variability, suggesting right atrial pressure of 3 mmHg. IAS/Shunts: No atrial level shunt detected by color flow Doppler.  LEFT VENTRICLE PLAX 2D                        Biplane EF (MOD) LVIDd:         4.90 cm         LV Biplane EF:   Left LVIDs:         4.50 cm                          ventricular LV PW:         1.10 cm                          ejection LV IVS:        1.40 cm                          fraction by LVOT diam:     1.97 cm                          2D MOD LV SV:         36                               biplane is LV SV Index:   17                               29.1 %. LVOT Area:     3.05 cm  LV Volumes (MOD) LV vol d, MOD    128.0 ml A2C: LV vol d, MOD    122.0 ml A4C: LV vol s, MOD    85.0 ml A2C: LV vol s, MOD    93.9 ml A4C: LV SV MOD A2C:   43.0 ml LV SV MOD A4C:   122.0 ml LV SV MOD BP:    37.3 ml RIGHT VENTRICLE RV S prime:     14.60 cm/s  TAPSE (M-mode): 1.7 cm LEFT ATRIUM             Index        RIGHT ATRIUM           Index LA diam:        4.63 cm 2.22 cm/m   RA Area:     14.30 cm LA Vol (A2C):   53.4 ml 25.64 ml/m  RA Volume:   32.40 ml  15.56 ml/m LA Vol (A4C):   72.3 ml 34.72 ml/m LA Biplane Vol: 65.0 ml 31.21 ml/m  AORTIC VALVE LVOT Vmax:   101.00 cm/s LVOT Vmean:  66.400 cm/s LVOT VTI:    0.119 m  AORTA Ao Root diam: 3.14 cm  SHUNTS Systemic VTI:  0.12 m Systemic Diam: 1.97 cm Soyla Merck MD Electronically signed by Soyla Merck MD Signature Date/Time: 10/31/2024/9:05:11 AM    Final    DG Chest Port 1 View Result Date: 10/30/2024 EXAM: 1 VIEW(S) XRAY OF THE CHEST 10/30/2024 09:53:49 PM COMPARISON: Comparison study 01/29/2024. CLINICAL HISTORY: Peripheral edema and shortness of breath. FINDINGS: LUNGS AND PLEURA: Increased vascular congestion is noted with interstitial edema. No infiltrate is noted. No sizable effusion is  seen. HEART AND MEDIASTINUM: Cardiac shadow is enlarged. Aortic calcifications are seen. BONES AND SOFT TISSUES: No bony abnormality is seen. IMPRESSION: 1. Increased vascular congestion with interstitial edema. No sizable effusion or infiltrate. Electronically signed by: Oneil Devonshire MD 10/30/2024 09:56 PM EST RP Workstation: [id number]    There are no new results to review at this time.  Previous records (including but not limited to H&P, progress notes, nursing notes, TOC management) were reviewed in assessment of this patient.  Labs: CBC: Recent Labs  Lab 11/25/24 1925 11/26/24 0506 11/27/24 0401  WBC 11.4* 12.4* 8.2  NEUTROABS 8.8*  --   --   HGB 12.7* 10.7* 8.8*  HCT 39.8 33.8* 27.0*  MCV 100.8* 99.7 97.8  PLT 290 247 192   Basic Metabolic Panel: Recent Labs  Lab 11/25/24 0952 11/25/24 1925 11/26/24 0506 11/26/24 1806  NA 140 142 140 137  K 5.0 4.8 6.2* 4.1  CL 106 107 107 105  CO2 18* 16* 24 18*  GLUCOSE 141* 217* 109* 134*  BUN 37* 39* 42* 46*  CREATININE 4.05* 4.05* 4.37* 4.59*  CALCIUM  9.1 8.9 8.3* 8.4*  MG  --  1.9  --   --   PHOS  --   --  4.9*  --    Liver Function Tests: Recent Labs  Lab 11/26/24 0506  AST 37  ALT 12  ALKPHOS 60  BILITOT 0.2  PROT 5.9*  ALBUMIN 3.1*   CBG: Recent Labs  Lab 11/26/24 1613 11/26/24 1950 11/27/24 0027 11/27/24 0731 11/27/24 1151  GLUCAP 131* 149* 131* 90 127*    Scheduled Meds:  aspirin   81 mg Oral Daily   atorvastatin   80 mg Oral Daily   carvedilol   25 mg Oral BID WC   clopidogrel   75 mg Oral Daily   fenofibrate   160 mg Oral Daily   furosemide   60 mg Intravenous BID   insulin  aspart  0-9 Units Subcutaneous Q4H   isosorbide -hydrALAZINE   2 tablet Oral TID   pantoprazole   40 mg Oral QHS   Continuous Infusions:  heparin  1,000 Units/hr (11/26/24 2125)   nitroGLYCERIN  Stopped (11/26/24 1220)   PRN Meds:.acetaminophen , HYDROmorphone  (DILAUDID ) injection, melatonin, methocarbamol , metoprolol  tartrate,  polyethylene glycol, prochlorperazine   Family Communication: None at bedside  Disposition: Status is: Inpatient Remains inpatient  appropriate because: NSTEMI, CHF, respiratory failure     Time spent: 52 minutes  Length of inpatient stay: 2 days  Author: Carliss LELON Canales, DO 11/27/2024 12:15 PM  For on call review [email or web address].   "

## 2024-11-28 DIAGNOSIS — I161 Hypertensive emergency: Secondary | ICD-10-CM | POA: Diagnosis not present

## 2024-11-28 DIAGNOSIS — N184 Chronic kidney disease, stage 4 (severe): Secondary | ICD-10-CM | POA: Diagnosis not present

## 2024-11-28 DIAGNOSIS — I214 Non-ST elevation (NSTEMI) myocardial infarction: Secondary | ICD-10-CM | POA: Diagnosis not present

## 2024-11-28 DIAGNOSIS — R7989 Other specified abnormal findings of blood chemistry: Secondary | ICD-10-CM | POA: Diagnosis not present

## 2024-11-28 DIAGNOSIS — I5023 Acute on chronic systolic (congestive) heart failure: Secondary | ICD-10-CM | POA: Diagnosis not present

## 2024-11-28 DIAGNOSIS — J9601 Acute respiratory failure with hypoxia: Secondary | ICD-10-CM | POA: Diagnosis not present

## 2024-11-28 LAB — CBC
HCT: 29 % — ABNORMAL LOW (ref 39.0–52.0)
Hemoglobin: 9.7 g/dL — ABNORMAL LOW (ref 13.0–17.0)
MCH: 32.8 pg (ref 26.0–34.0)
MCHC: 33.4 g/dL (ref 30.0–36.0)
MCV: 98 fL (ref 80.0–100.0)
Platelets: 217 K/uL (ref 150–400)
RBC: 2.96 MIL/uL — ABNORMAL LOW (ref 4.22–5.81)
RDW: 14.2 % (ref 11.5–15.5)
WBC: 6.6 K/uL (ref 4.0–10.5)
nRBC: 0 % (ref 0.0–0.2)

## 2024-11-28 LAB — GLUCOSE, CAPILLARY
Glucose-Capillary: 104 mg/dL — ABNORMAL HIGH (ref 70–99)
Glucose-Capillary: 106 mg/dL — ABNORMAL HIGH (ref 70–99)
Glucose-Capillary: 115 mg/dL — ABNORMAL HIGH (ref 70–99)
Glucose-Capillary: 121 mg/dL — ABNORMAL HIGH (ref 70–99)
Glucose-Capillary: 122 mg/dL — ABNORMAL HIGH (ref 70–99)
Glucose-Capillary: 156 mg/dL — ABNORMAL HIGH (ref 70–99)
Glucose-Capillary: 205 mg/dL — ABNORMAL HIGH (ref 70–99)

## 2024-11-28 LAB — TROPONIN T, HIGH SENSITIVITY: Troponin T High Sensitivity: 1551 ng/L (ref 0–19)

## 2024-11-28 LAB — COMPREHENSIVE METABOLIC PANEL WITH GFR
ALT: 10 U/L (ref 0–44)
AST: 25 U/L (ref 15–41)
Albumin: 3.2 g/dL — ABNORMAL LOW (ref 3.5–5.0)
Alkaline Phosphatase: 54 U/L (ref 38–126)
Anion gap: 11 (ref 5–15)
BUN: 41 mg/dL — ABNORMAL HIGH (ref 6–20)
CO2: 25 mmol/L (ref 22–32)
Calcium: 9.1 mg/dL (ref 8.9–10.3)
Chloride: 105 mmol/L (ref 98–111)
Creatinine, Ser: 4.5 mg/dL — ABNORMAL HIGH (ref 0.61–1.24)
GFR, Estimated: 15 mL/min — ABNORMAL LOW
Glucose, Bld: 101 mg/dL — ABNORMAL HIGH (ref 70–99)
Potassium: 3.8 mmol/L (ref 3.5–5.1)
Sodium: 142 mmol/L (ref 135–145)
Total Bilirubin: 0.3 mg/dL (ref 0.0–1.2)
Total Protein: 6.1 g/dL — ABNORMAL LOW (ref 6.5–8.1)

## 2024-11-28 LAB — HEPARIN LEVEL (UNFRACTIONATED)
Heparin Unfractionated: 1 [IU]/mL — ABNORMAL HIGH (ref 0.30–0.70)
Heparin Unfractionated: >1.1 [IU]/mL — ABNORMAL HIGH (ref 0.30–0.70)

## 2024-11-28 LAB — MAGNESIUM: Magnesium: 1.8 mg/dL (ref 1.7–2.4)

## 2024-11-28 MED ORDER — LABETALOL HCL 5 MG/ML IV SOLN
10.0000 mg | Freq: Four times a day (QID) | INTRAVENOUS | Status: DC | PRN
  Administered 2024-11-28 (×2): 10 mg via INTRAVENOUS
  Filled 2024-11-28 (×2): qty 4

## 2024-11-28 MED ORDER — HEPARIN SODIUM (PORCINE) 5000 UNIT/ML IJ SOLN
5000.0000 [IU] | Freq: Three times a day (TID) | INTRAMUSCULAR | Status: DC
  Administered 2024-11-28 – 2024-11-29 (×3): 5000 [IU] via SUBCUTANEOUS
  Filled 2024-11-28 (×3): qty 1

## 2024-11-28 MED ORDER — CHLORHEXIDINE GLUCONATE CLOTH 2 % EX PADS
6.0000 | MEDICATED_PAD | Freq: Every day | CUTANEOUS | Status: DC
  Administered 2024-11-28 – 2024-11-29 (×2): 6 via TOPICAL

## 2024-11-28 MED ORDER — LABETALOL HCL 5 MG/ML IV SOLN
20.0000 mg | Freq: Once | INTRAVENOUS | Status: AC
  Administered 2024-11-28: 20 mg via INTRAVENOUS
  Filled 2024-11-28: qty 4

## 2024-11-28 MED ORDER — OXYCODONE HCL 5 MG PO TABS
5.0000 mg | ORAL_TABLET | Freq: Four times a day (QID) | ORAL | Status: DC | PRN
  Administered 2024-11-28: 5 mg via ORAL
  Filled 2024-11-28: qty 1

## 2024-11-28 MED ORDER — LABETALOL HCL 5 MG/ML IV SOLN: 10.0000 mg | INTRAVENOUS | Status: DC | PRN

## 2024-11-28 NOTE — Plan of Care (Signed)

## 2024-11-28 NOTE — Plan of Care (Signed)

## 2024-11-28 NOTE — Progress Notes (Signed)
 Report given to [employer]. Patient cleaned up, bathed, & transferred to room 307.

## 2024-11-28 NOTE — Progress Notes (Signed)
 " Progress Note   Patient: Taylor Obrien FMW:[id number] DOB: 04-19-1966 DOA: 11/25/2024  DOS: the patient was seen and examined on 11/28/2024   Brief hospital course:  58 y.o. male with medical history significant for peripheral artery disease, history of CVA, wheelchair dependent since 2016, hypertension, type 2 diabetes, hyperlipidemia, chronic HFrEF, CKD 4, history of alcohol abuse and alcoholic pancreatitis, major depressive disorder, GERD, OSA intolerant of CPAP, who presented to the ER due to intermittent moderate to severe chest pain, sharp, centrally located, and radiating to his back.   Assessment and Plan:  Acute hypoxic respiratory failure - Good saturation on room air appreciated on today's examination - Continue diuresis and follow clinical response. - Hopefully discharge home in the next 24 hours.  Acute exacerbation of chronic HFrEF - Elevated proBNP, hypoxia, edema noted on chest x-ray.   -Echo noting EF 30-35%.  - Continue IV diuresis - Daily weight, strict intake and output and low-sodium diet discussed with patient.  NSTEMI - Troponins elevated with only mild upward trend, 1086, 1298, 1368.  Likely ongoing demand ischemia in the setting of CKD4.  -Patient denies chest pain - Has completed 48 hours of heparin  drip. - Continue the use of aspirin , Plavix  and statin. - Continue telemetry monitoring.  Hypertensive emergency - Reportedly presented with BP as high as 264/155.   - Continue adjusted antihypertensive agents - Continue diuresis - Heart healthy/low-sodium diet discussed with patient. - As needed labetalol  has been added as needed.  CKD4 - Creatinine appears to be around baseline  - Continue minimizing nephrotoxic agents - Continue to follow renal function trend with active diuresis.  Hyperkalemia - Continue to closely follow electrolytes - Continue telemetry monitoring.  Physical debilitation muscle weakness/history of CVA/wheelchair dependent -  Fall precautions on board. - Continue supportive care.   Subjective:  No chest pain, no nausea, no vomiting, no fever.  Overall expressing improvement in his breathing.  Good urine output reported.  Physical Exam:  Vitals:   11/28/24 1436 11/28/24 1644 11/28/24 1700 11/28/24 1719  BP: (!) 175/89 (!) 185/110 (!) 142/108 (!) 120/90  Pulse: 86  83 82  Resp: 18  17 15   Temp:      TempSrc:      SpO2: 95%  98% 97%  Weight:      Height:       General exam: Alert, awake, oriented x 3; complaining of back pain; no chest pain, no nausea, no vomiting.  Reports breathing has improved and is not requiring oxygen  supplementation at the moment. Respiratory system: Decreased breath sounds at the bases, fine crackles appreciated ; no using accessory muscles.  Good saturation on room air. Cardiovascular system: Rate controlled, no rubs, no gallops, no JVD. Gastrointestinal system: Abdomen is nondistended, soft and nontender. No organomegaly or masses felt. Normal bowel sounds heard. Central nervous system: No new focal neurological deficits. Extremities: No cyanosis or clubbing. Skin: No petechiae. Psychiatry: Judgement and insight appear normal. Mood & affect appropriate.    Data Reviewed:  Imaging Studies: ECHOCARDIOGRAM LIMITED Result Date: 11/26/2024    ECHOCARDIOGRAM LIMITED REPORT   Patient Name:   Taylor Obrien Date of Exam: 11/26/2024 Medical Rec #:  [id number]     Height:       66.0 in Accession #:    [id number]    Weight:       164.0 lb Date of Birth:  Apr 29, 1966     BSA:          1.838 m  Patient Age:    58 years      BP:           163/110 mmHg Patient Gender: M             HR:           85 bpm. Exam Location:  Inpatient Procedure: Limited Echo, Cardiac Doppler, Color Doppler and Intracardiac            Opacification Agent (Both Spectral and Color Flow Doppler were            utilized during procedure). Indications:    122-I22.9 Subsequent ST elevation (STEM) and non-ST elevation                  (NSTEMI) myocardial infarction  History:        Patient has prior history of Echocardiogram examinations, most                 recent 10/31/2024. Stroke, Signs/Symptoms:Shortness of Breath,                 Dyspnea, Chest Pain and Altered Mental Status; Risk                 Factors:Current Smoker, Sleep Apnea, Diabetes, Dyslipidemia and                 Hypertension. ETOH.  Sonographer:    Ellouise Mose RDCS Referring Phys: [id number] CAROLE N HALL  Sonographer Comments: Technically difficult study due to poor echo windows. IMPRESSIONS  1. Limited study.  2. Left ventricular ejection fraction, by estimation, is 30 to 35%. Left ventricular ejection fraction by 2D MOD biplane is 31.4 %. The left ventricle has moderately decreased function. The left ventricle demonstrates regional wall motion abnormalities (see scoring diagram/findings for description). There is moderate asymmetric left ventricular hypertrophy of the septal segment. Left ventricular diastolic function could not be evaluated. The average left ventricular global longitudinal strain is -12.1 %. The global longitudinal strain is abnormal.  3. Right ventricular systolic function is normal. The right ventricular size is normal. Tricuspid regurgitation signal is inadequate for assessing PA pressure.  4. The mitral valve is grossly normal. Mild mitral valve regurgitation.  5. The aortic valve was not well visualized.  6. The inferior vena cava is normal in size with greater than 50% respiratory variability, suggesting right atrial pressure of 3 mmHg. Comparison(s): Prior images reviewed side by side. LVEF 30-35% range at this point, similar wall motion abnormalties were present on the prior study as well. Mild mitral regurgitation. FINDINGS  Left Ventricle: Left ventricular ejection fraction, by estimation, is 30 to 35%. Left ventricular ejection fraction by 2D MOD biplane is 31.4 %. The left ventricle has moderately decreased function. The left ventricle  demonstrates regional wall motion abnormalities. Definity  contrast agent was given IV to delineate the left ventricular endocardial borders. The average left ventricular global longitudinal strain is -12.1 %. Strain was performed and the global longitudinal strain is abnormal. The left ventricular internal cavity size was normal in size. There is moderate asymmetric left ventricular hypertrophy of the septal segment. Left ventricular diastolic function could not be evaluated.  LV Wall Scoring: The basal inferolateral segment, basal anterolateral segment, and basal inferior segment are akinetic. The entire anterior wall, mid and distal lateral wall, entire septum, entire apex, mid and distal inferior wall, and mid anterolateral segment are hypokinetic. Right Ventricle: The right ventricular size is normal. No increase in right ventricular wall thickness. Right ventricular  systolic function is normal. Tricuspid regurgitation signal is inadequate for assessing PA pressure. Pericardium: There is no evidence of pericardial effusion. Mitral Valve: The mitral valve is grossly normal. Mild mitral valve regurgitation. Tricuspid Valve: The tricuspid valve is grossly normal. Tricuspid valve regurgitation is trivial. No evidence of tricuspid stenosis. Aortic Valve: The aortic valve was not well visualized. Aorta: The aortic root and ascending aorta are structurally normal, with no evidence of dilitation. Venous: The inferior vena cava is normal in size with greater than 50% respiratory variability, suggesting right atrial pressure of 3 mmHg. Additional Comments: Spectral Doppler performed. Color Doppler performed.  LEFT VENTRICLE PLAX 2D                        Biplane EF (MOD) LVIDd:         4.80 cm         LV Biplane EF:   Left LVIDs:         4.30 cm                          ventricular LV PW:         1.40 cm                          ejection LV IVS:        1.70 cm                          fraction by                                                  2D MOD                                                 biplane is LV Volumes (MOD)                                31.4 %. LV vol d, MOD    119.0 ml A2C: LV vol d, MOD    95.7 ml       2D Longitudinal A4C:                           Strain LV vol s, MOD    77.0 ml       2D Strain GLS   -12.1 % A2C:                           Avg: LV vol s, MOD    66.2 ml A4C: LV SV MOD A2C:   42.0 ml LV SV MOD A4C:   95.7 ml LV SV MOD BP:    34.2 ml IVC IVC diam: 0.70 cm AORTIC VALVE LVOT Vmax:   106.00 cm/s LVOT Vmean:  64.500 cm/s LVOT VTI:    0.148 m  AORTA Ao Asc diam: 2.90 cm MITRAL VALVE MV Area (PHT): 4.49 cm     SHUNTS MV Decel Time: 169 msec  Systemic VTI: 0.15 m MV E velocity: 49.30 cm/s MV A velocity: 103.00 cm/s MV E/A ratio:  0.48 Jayson Sierras MD Electronically signed by Jayson Sierras MD Signature Date/Time: 11/26/2024/1:45:30 PM    Final    DG Chest Port 1 View Result Date: 11/25/2024 EXAM: 1 VIEW(S) XRAY OF THE CHEST 11/25/2024 07:32:09 PM COMPARISON: 11/15/2024 CLINICAL HISTORY: SOB FINDINGS: LUNGS AND PLEURA: Increased central vascular congestion with diffuse parenchymal opacity bilaterally consistent with edema. Small left pleural effusion is noted. No pneumothorax. HEART AND MEDIASTINUM: Cardiomegaly. Increased central vascular congestion. BONES AND SOFT TISSUES: No acute osseous abnormality. IMPRESSION: 1. Worsening congestive heart failure 2. Small left pleural effusion. Electronically signed by: Oneil Devonshire MD 11/25/2024 07:34 PM EST RP Workstation: [id number]   CT CERVICAL SPINE WO CONTRAST Result Date: 11/15/2024 EXAM: CT CERVICAL SPINE WITHOUT CONTRAST 11/15/2024 07:22:07 PM TECHNIQUE: CT of the cervical spine was performed without the administration of intravenous contrast. Multiplanar reformatted images are provided for review. Automated exposure control, iterative reconstruction, and/or weight based adjustment of the mA/kV was utilized to reduce the radiation dose to as  low as reasonably achievable. COMPARISON: CT spine 07/06/2018. CLINICAL HISTORY: Polytrauma, blunt. 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 No acute fracture or traumatic malalignment. DEGENERATIVE CHANGES: Multilevel mild-to-moderate degenerative changes of the spine. No associated severe osseous neural foraminal or central canal stenosis. SOFT TISSUES: No prevertebral soft tissue swelling. IMPRESSION: 1. No acute abnormality of the cervical spine. 2. 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 Electronically signed by: Morgane Naveau MD 11/15/2024 07:48 PM EST RP Workstation: [id number]   CT T-SPINE NO CHARGE Result Date: 11/15/2024 EXAM: CT THORACIC SPINE WITHOUT CONTRAST 11/15/2024 07:22:07 PM TECHNIQUE: CT of the thoracic spine was performed without the administration of intravenous contrast. Multiplanar reformatted images are provided for review. Automated exposure control, iterative reconstruction, and/or weight based adjustment of the mA/kV was utilized to reduce the radiation dose to as low as reasonably achievable. COMPARISON: MRI lumbar spine 06/27/2024. CLINICAL HISTORY: FINDINGS: BONES AND ALIGNMENT: Normal vertebral body heights except for T12. Chronic stable T12 supra endplate mild compression fracture with associated Schmorl's node. No acute fracture or suspicious bone lesion. Normal alignment. DEGENERATIVE CHANGES: Multilevel mild-to-moderate anterior osteophyte formation. SOFT TISSUES: No acute abnormality. IMPRESSION: 1. No acute findings. 2. Chronic stable T12 superior endplate mild compression fracture. 3. Please see separately dictated CT , abdomen, pelvis, lumbar spine 11/15/2024. Electronically signed by: Morgane Naveau MD 11/15/2024 07:36 PM EST RP Workstation: [id number]   CT L-SPINE NO CHARGE Result Date: 11/15/2024 EXAM: CT OF THE LUMBAR SPINE WITHOUT  CONTRAST 11/15/2024 07:22:07 PM TECHNIQUE: CT of the lumbar spine was performed without the administration of intravenous contrast. Multiplanar reformatted images are provided for review. Automated exposure control, iterative reconstruction, and/or weight based adjustment of the mA/kV was utilized to reduce the radiation dose to as low as reasonably achievable. COMPARISON: MRI lumbar spine 06/27/2024. CLINICAL HISTORY: Please see separately dictated CT thoracic spine 11/15/2024. FINDINGS: BONES AND ALIGNMENT: Normal vertebral body heights. No acute fracture or suspicious bone lesion. Normal alignment. DEGENERATIVE CHANGES: Mild anterior osteophyte formation. SOFT TISSUES: No acute abnormality. IMPRESSION: 1. No acute findings. 2. Please see separately dictated CT chest, abdomen, pelvis, thoracic spine 11/15/2024. Electronically signed by: Morgane Naveau MD 11/15/2024 07:35 PM EST RP Workstation: [id number]   CT CHEST ABDOMEN PELVIS WO CONTRAST Result Date: 11/15/2024 EXAM: CT CHEST, ABDOMEN AND PELVIS WITHOUT CONTRAST 11/15/2024 07:22:07 PM  TECHNIQUE: CT of the chest, abdomen and pelvis was performed without the administration of intravenous contrast. Multiplanar reformatted images are provided for review. Automated exposure control, iterative reconstruction, and/or weight based adjustment of the mA/kV was utilized to reduce the radiation dose to as low as reasonably achievable. COMPARISON: CT angio neurobiform 06/27/2024, CT abdomen and pelvis 04/29/2024. Please see separately dictated CT thoracolumbar spine 11/15/2024. CLINICAL HISTORY: Polytrauma, blunt. FINDINGS: CHEST: MEDIASTINUM AND LYMPH NODES: Heart and pericardium are unremarkable. The central airways are clear. No mediastinal, hilar or axillary lymphadenopathy.No gross hilar adenopathy with limited evaluation on this noncontrast study. 4-vessel coronary artery calcifications. LUNGS AND PLEURA: No focal consolidation or pulmonary edema. No pleural  effusion or pneumothorax. ABDOMEN AND PELVIS: LIVER: The liver is unremarkable. GALLBLADDER AND BILE DUCTS: Gallbladder is unremarkable. No biliary ductal dilatation. SPLEEN: No acute abnormality. PANCREAS: No acute abnormality. ADRENAL GLANDS: No acute abnormality. KIDNEYS, URETERS AND BLADDER: No stones in the kidneys or ureters. No hydronephrosis. No perinephric or periureteral stranding. Urinary bladder is unremarkable. Calcifications associated with the kidneys are likely vascular. GI AND BOWEL: Stomach demonstrates no acute abnormality. There is no bowel obstruction. No small or large bowel thickening or dilatation. The appendix is unremarkable. Colonic diverticulosis. Otherwise, the colon is unremarkable. REPRODUCTIVE ORGANS: No acute abnormality. PERITONEUM AND RETROPERITONEUM: No ascites. No free air. VASCULATURE: Aorta is normal in caliber. Severe atherosclerotic plaque of the abdominal aorta. Mild atherosclerotic plaque in the thoracic aorta. ABDOMINAL AND PELVIS LYMPH NODES: No lymphadenopathy. BONES AND SOFT TISSUES: No acute displaced rib fracture. No acute displaced fracture or dislocation of either shoulder or hips. No acute displaced fracture or diastasis of the bones of the pelvis. No acute sacral fracture. No large soft tissue hematoma. Please see separately dictated CT thoracolumbar spine 12.8.25 . No large soft tissue hematoma No focal soft tissue abnormality. IMPRESSION: 1. No acute findings related to trauma . 2. Please see separately dictated CT thoracolumbar spine 12.8.25 . 3. Other, non-acute and/or normal findings as above. Electronically signed by: Morgane Naveau MD 11/15/2024 07:34 PM EST RP Workstation: [id number]   CT HEAD WO CONTRAST Result Date: 11/15/2024 EXAM: CT HEAD WITHOUT CONTRAST 11/15/2024 07:22:07 PM TECHNIQUE: CT of the head was performed without the administration of intravenous contrast. Automated exposure control, iterative reconstruction, and/or weight based  adjustment of the mA/kV was utilized to reduce the radiation dose to as low as reasonably achievable. COMPARISON: None available. CLINICAL HISTORY: Head trauma, moderate-severe FINDINGS: BRAIN AND VENTRICLES: No acute hemorrhage. No evidence of acute infarct. 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 Chronic stable lacunar infarcts in left basal ganglia and pons. Patchy and confluent supratentorial white matter hypodensities, likely representing advanced chronic microvascular ischemic changes. No hydrocephalus. No extra-axial collection. No mass effect or midline shift. Atherosclerotic calcifications are present within the cavernous internal carotid arteries. ORBITS: No acute abnormality. SINUSES: No acute abnormality. SOFT TISSUES AND SKULL: No acute soft tissue abnormality. No skull fracture. IMPRESSION: 1. No acute intracranial abnormality related to head trauma. Electronically signed by: Morgane Naveau MD 11/15/2024 07:29 PM EST RP Workstation: [id number]   DG Pelvis Portable Result Date: 11/15/2024 CLINICAL DATA:  Clemens out of wheelchair, lower back pain EXAM: PORTABLE PELVIS 1-2 VIEWS COMPARISON:  None Available. FINDINGS: Frontal view of the pelvis includes both hips. No acute displaced fracture, subluxation, or dislocation. Mild symmetrical bilateral hip osteoarthritis. Soft tissues are unremarkable. IMPRESSION: 1. Symmetrical bilateral hip osteoarthritis. 2. No acute fracture. Electronically Signed   By: Ozell Delores HERO.D.  On: 11/15/2024 18:10   DG Chest Port 1 View Result Date: 11/15/2024 CLINICAL DATA:  Clemens out of wheelchair, lower back pain EXAM: PORTABLE CHEST 1 VIEW COMPARISON:  10/30/2024 FINDINGS: The heart size and mediastinal contours are within normal limits. Both lungs are clear. The visualized skeletal structures are unremarkable. IMPRESSION: No active disease. Electronically Signed   By: Ozell Daring M.D.   On: 11/15/2024 18:09   US  RENAL Result Date:  10/31/2024 EXAM: US  Retroperitoneum Complete, Renal. 10/31/2024 10:51:00 AM TECHNIQUE: Real-time ultrasonography of the retroperitoneum renal was performed. COMPARISON: None available CLINICAL HISTORY: 409830 AKI (acute kidney injury) 409830 AKI (acute kidney injury) FINDINGS: FINDINGS: RIGHT KIDNEY/URETER: The right kidney measures 10.2 x 5.8 x 4.3 cm with a volume of 131.9 cc. Increased parenchymal echogenicity. No hydronephrosis. No calculus. No mass. LEFT KIDNEY/URETER: The left kidney measures 10.6 x 6.0 x 4.1 cm with a volume of 137.6 cc. Increased parenchymal echogenicity. No hydronephrosis. No calculus. No mass. BLADDER: The bladder appears normal with visualization of both ureteral jets. IMPRESSION: 1. Increased bilateral renal parenchymal echogenicity, compatible with medical renal disease 2. No signs of hydronephrosis. Electronically signed by: Waddell Calk MD 10/31/2024 11:20 AM EST RP Workstation: [id number]   ECHOCARDIOGRAM COMPLETE Result Date: 10/31/2024    ECHOCARDIOGRAM REPORT   Patient Name:   Taylor Obrien Date of Exam: 10/31/2024 Medical Rec #:  [id number]     Height:       66.0 in Accession #:    [id number]    Weight:       220.0 lb Date of Birth:  [birth year]     BSA:          2.083 m Patient Age:    58 years      BP:           154/109 mmHg Patient Gender: M             HR:           104 bpm. Exam Location:  Inpatient Procedure: 2D Echo, Cardiac Doppler and Color Doppler (Both Spectral and Color            Flow Doppler were utilized during procedure). STAT ECHO Indications:    I50.31 Acute diastolic (congestive) heart failure  History:        Patient has prior history of Echocardiogram examinations, most                 recent 06/19/2023. Risk Factors:Hypertension, Dyslipidemia, ETOH,                 Diabetes and Sleep Apnea.  Sonographer:    Damien Senior RDCS Referring Phys: [id number] FONDA JAYSON SHARPS IMPRESSIONS  1. Left ventricular ejection fraction, by estimation, is 30%. Left ventricular  ejection fraction by 2D MOD biplane is 29.1 %. The left ventricle has moderately decreased function. The left ventricle demonstrates regional wall motion abnormalities (see scoring diagram/findings for description). There is moderate asymmetric left ventricular hypertrophy of the septal segment. Indeterminate diastolic filling due to E-A fusion.  2. Right ventricular systolic function is normal. The right ventricular size is normal. Tricuspid regurgitation signal is inadequate for assessing PA pressure.  3. The mitral valve is degenerative. Mild mitral valve regurgitation. No evidence of mitral stenosis.  4. The aortic valve was not well visualized. Aortic valve regurgitation is not visualized. No aortic stenosis is present.  5. The inferior vena cava is normal in size with greater than 50% respiratory variability, suggesting right atrial pressure  of 3 mmHg. FINDINGS  Left Ventricle: Left ventricular ejection fraction, by estimation, is 30%. Left ventricular ejection fraction by 2D MOD biplane is 29.1 %. The left ventricle has moderately decreased function. The left ventricle demonstrates regional wall motion abnormalities. The left ventricular internal cavity size was normal in size. There is moderate asymmetric left ventricular hypertrophy of the septal segment. Indeterminate diastolic filling due to E-A fusion.  LV Wall Scoring: The mid and distal lateral wall, posterior wall, and basal inferior segment are akinetic. The antero-lateral wall and mid inferior segment are hypokinetic. Right Ventricle: The right ventricular size is normal. Right vetricular wall thickness was not well visualized. Right ventricular systolic function is normal. Tricuspid regurgitation signal is inadequate for assessing PA pressure. Left Atrium: Left atrial size was normal in size. Right Atrium: Right atrial size was normal in size. Pericardium: There is no evidence of pericardial effusion. Mitral Valve: The mitral valve is degenerative  in appearance. Mild mitral valve regurgitation. No evidence of mitral valve stenosis. Tricuspid Valve: The tricuspid valve is normal in structure. Tricuspid valve regurgitation is trivial. No evidence of tricuspid stenosis. Aortic Valve: The aortic valve was not well visualized. Aortic valve regurgitation is not visualized. No aortic stenosis is present. Pulmonic Valve: The pulmonic valve was not well visualized. Pulmonic valve regurgitation is not visualized. No evidence of pulmonic stenosis. Aorta: The aortic root is normal in size and structure and the ascending aorta was not well visualized. Venous: The inferior vena cava is normal in size with greater than 50% respiratory variability, suggesting right atrial pressure of 3 mmHg. IAS/Shunts: No atrial level shunt detected by color flow Doppler.  LEFT VENTRICLE PLAX 2D                        Biplane EF (MOD) LVIDd:         4.90 cm         LV Biplane EF:   Left LVIDs:         4.50 cm                          ventricular LV PW:         1.10 cm                          ejection LV IVS:        1.40 cm                          fraction by LVOT diam:     1.97 cm                          2D MOD LV SV:         36                               biplane is LV SV Index:   17                               29.1 %. LVOT Area:     3.05 cm  LV Volumes (MOD) LV vol d, MOD    128.0 ml A2C: LV vol d, MOD    122.0 ml A4C: LV  vol s, MOD    85.0 ml A2C: LV vol s, MOD    93.9 ml A4C: LV SV MOD A2C:   43.0 ml LV SV MOD A4C:   122.0 ml LV SV MOD BP:    37.3 ml RIGHT VENTRICLE RV S prime:     14.60 cm/s TAPSE (M-mode): 1.7 cm LEFT ATRIUM             Index        RIGHT ATRIUM           Index LA diam:        4.63 cm 2.22 cm/m   RA Area:     14.30 cm LA Vol (A2C):   53.4 ml 25.64 ml/m  RA Volume:   32.40 ml  15.56 ml/m LA Vol (A4C):   72.3 ml 34.72 ml/m LA Biplane Vol: 65.0 ml 31.21 ml/m  AORTIC VALVE LVOT Vmax:   101.00 cm/s LVOT Vmean:  66.400 cm/s LVOT VTI:    0.119 m  AORTA Ao Root  diam: 3.14 cm  SHUNTS Systemic VTI:  0.12 m Systemic Diam: 1.97 cm Soyla Merck MD Electronically signed by Soyla Merck MD Signature Date/Time: 10/31/2024/9:05:11 AM    Final    DG Chest Port 1 View Result Date: 10/30/2024 EXAM: 1 VIEW(S) XRAY OF THE CHEST 10/30/2024 09:53:49 PM COMPARISON: Comparison study 01/29/2024. CLINICAL HISTORY: Peripheral edema and shortness of breath. FINDINGS: LUNGS AND PLEURA: Increased vascular congestion is noted with interstitial edema. No infiltrate is noted. No sizable effusion is seen. HEART AND MEDIASTINUM: Cardiac shadow is enlarged. Aortic calcifications are seen. BONES AND SOFT TISSUES: No bony abnormality is seen. IMPRESSION: 1. Increased vascular congestion with interstitial edema. No sizable effusion or infiltrate. Electronically signed by: Oneil Devonshire MD 10/30/2024 09:56 PM EST RP Workstation: [id number]    There are no new results to review at this time.  Previous records (including but not limited to H&P, progress notes, nursing notes, TOC management) were reviewed in assessment of this patient.  Labs: CBC: Recent Labs  Lab 11/25/24 1925 11/26/24 0506 11/27/24 0401 11/28/24 0432  WBC 11.4* 12.4* 8.2 6.6  NEUTROABS 8.8*  --   --   --   HGB 12.7* 10.7* 8.8* 9.7*  HCT 39.8 33.8* 27.0* 29.0*  MCV 100.8* 99.7 97.8 98.0  PLT 290 247 192 217   Basic Metabolic Panel: Recent Labs  Lab 11/25/24 0952 11/25/24 1925 11/26/24 0506 11/26/24 1806 11/28/24 0432  NA 140 142 140 137 142  K 5.0 4.8 6.2* 4.1 3.8  CL 106 107 107 105 105  CO2 18* 16* 24 18* 25  GLUCOSE 141* 217* 109* 134* 101*  BUN 37* 39* 42* 46* 41*  CREATININE 4.05* 4.05* 4.37* 4.59* 4.50*  CALCIUM  9.1 8.9 8.3* 8.4* 9.1  MG  --  1.9  --   --  1.8  PHOS  --   --  4.9*  --   --    Liver Function Tests: Recent Labs  Lab 11/26/24 0506 11/28/24 0432  AST 37 25  ALT 12 10  ALKPHOS 60 54  BILITOT 0.2 0.3  PROT 5.9* 6.1*  ALBUMIN 3.1* 3.2*   CBG: Recent Labs  Lab  11/27/24 2359 11/28/24 0426 11/28/24 0730 11/28/24 1151 11/28/24 1653  GLUCAP 121* 106* 104* 156* 115*    Scheduled Meds:  aspirin   81 mg Oral Daily   atorvastatin   80 mg Oral Daily   carvedilol   25 mg Oral BID WC   Chlorhexidine  Gluconate Cloth  6 each Topical Daily   clopidogrel   75 mg Oral Daily   fenofibrate   160 mg Oral Daily   furosemide   60 mg Intravenous BID   heparin  injection (subcutaneous)  5,000 Units Subcutaneous Q8H   insulin  aspart  0-9 Units Subcutaneous Q4H   isosorbide -hydrALAZINE   2 tablet Oral TID   pantoprazole   40 mg Oral QHS   Continuous Infusions:  nitroGLYCERIN  Stopped (11/26/24 1220)   PRN Meds:.acetaminophen , labetalol , melatonin, methocarbamol , polyethylene glycol, prochlorperazine   Family Communication: None at bedside  Disposition: Status is: Inpatient Remains inpatient appropriate because: NSTEMI, CHF, respiratory failure     Time spent: 52 minutes  Length of inpatient stay: 3 days  Author: Eric Nunnery, MD 11/28/2024 5:24 PM  For on call review [email or web address].   "

## 2024-11-29 DIAGNOSIS — J9601 Acute respiratory failure with hypoxia: Secondary | ICD-10-CM | POA: Diagnosis not present

## 2024-11-29 DIAGNOSIS — I214 Non-ST elevation (NSTEMI) myocardial infarction: Secondary | ICD-10-CM | POA: Diagnosis not present

## 2024-11-29 DIAGNOSIS — I5023 Acute on chronic systolic (congestive) heart failure: Secondary | ICD-10-CM | POA: Diagnosis not present

## 2024-11-29 DIAGNOSIS — N184 Chronic kidney disease, stage 4 (severe): Secondary | ICD-10-CM | POA: Diagnosis not present

## 2024-11-29 LAB — GLUCOSE, CAPILLARY
Glucose-Capillary: 74 mg/dL (ref 70–99)
Glucose-Capillary: 114 mg/dL — ABNORMAL HIGH (ref 70–99)
Glucose-Capillary: 213 mg/dL — ABNORMAL HIGH (ref 70–99)

## 2024-11-29 LAB — BASIC METABOLIC PANEL WITH GFR
Anion gap: 11 (ref 5–15)
BUN: 42 mg/dL — ABNORMAL HIGH (ref 6–20)
CO2: 28 mmol/L (ref 22–32)
Calcium: 9.1 mg/dL (ref 8.9–10.3)
Chloride: 101 mmol/L (ref 98–111)
Creatinine, Ser: 4.71 mg/dL — ABNORMAL HIGH (ref 0.61–1.24)
GFR, Estimated: 14 mL/min — ABNORMAL LOW
Glucose, Bld: 90 mg/dL (ref 70–99)
Potassium: 3.7 mmol/L (ref 3.5–5.1)
Sodium: 140 mmol/L (ref 135–145)

## 2024-11-29 MED ORDER — TORSEMIDE 20 MG PO TABS: 40.0000 mg | ORAL_TABLET | Freq: Two times a day (BID) | ORAL | 2 refills | Status: DC

## 2024-11-29 MED ORDER — ACETAMINOPHEN 500 MG PO TABS: 1000.0000 mg | ORAL_TABLET | Freq: Three times a day (TID) | ORAL | Status: DC | PRN

## 2024-11-29 NOTE — Plan of Care (Signed)

## 2024-11-29 NOTE — Care Management Important Message (Signed)
 Important Message  Patient Details  Name: Taylor Obrien MRN: [id number] Date of Birth: 06-03-1966   Important Message Given:  Yes - Medicare IM     [name] 11/29/2024, 2:35 PM

## 2024-11-29 NOTE — Discharge Instructions (Signed)
 Food Agency Name: [facility] Address: [address], [place], [place] [address] Phone: [phone] Website: www.adtsrc.org Service(s) Offered: Meals on [employer] and Meals with Friends.  Home care, at home assisted living, volunteer services, Center  for Active Retirement, transportation Agency Name: [facility] Address: Sites vary. Much call first. Food Pantry locations: [address] [place], [place] [address] [employer] Phone: [phone] Website: none Service(s) Offered: Museum/gallery curator, utility assistance if funds available Careers Adviser for all of [place], [employer], [place]  [employer] , [employer] and [place] for [place] area only) Walk-in  current Id and current address verification required. WedThurs: 9:30-12:00 Agency Name: [facility] Address: [address] [place], [place] [address] Phone: [phone] or [phone] Website: none Service(s) Offered: Food assistance. Agency Name: [facility] Address: [address] [place], [place] [address] Phone: [phone] Website: [email or web address] Service(s) Offered: Food assistance Monday Nights 5:00PM-5:30PM Agency Name: [employer] Address: [address], [place], [place] [address] Phone: [phone] or [phone] Website: none Service(s) Offered: Serves 1 hot meal a day at 11:00 am Monday-Sunday and 5 pm  on the second and fourth Sunday of each month Agency Name: [facility] Department of Health and Human/Social Services Address: [address], [place], [place] [address] Phone: [phone] Website: [email or web address]  Service(s) Offered: Sales Executive, WIC program. Agency Name: [facility] Address: [address] [place], [place] / [address] [place], [place]  March 31, 2023 7 Phone: [phone] [place] / [phone] [place] Website:  [email or web address]  Service(s) Offered: Civil Service Fast Streamer, food pantry, soup kitchen ([place]) emergency financial  assistance, thrift stores, showers & hygiene products ([place]),  Christmas Assistance, spiritual help. Agency Name: [name] Address: [address], [place], [place] [address] Phone: [phone] Website: [email or web address] Service(s) Offered: Food pantry Tuesday, Wednesday and Thursday 9am-11:30am  (need appointment) and health clinic (9:00am-11:00am)  Rent/Utilities/Housing Agency Name: [facility]  [employer] Address: [address] [address] [place], [place] [address] [address]  [place], [place] [address] P Phone Number: [phone] or [phone] For the hearing-impaired - Dial 711 for [employer] [place] [employer]  Agency Name: [facility] Dept. of Health and Human Services Address: [address], [place], [place] [address]  Phone: [phone]  Website: [email or web address]  Services Offered: Temporary financial assistance, subsidized housing, and utility  assistance  Agency Name: [employer] Address: [address], [place], [place] [address]  Phone: [phone] ext. [phone] Email: Contact: info@[email or web address] .org Website: footballpromos.co.[email or web address] Services Offered: Subsidized apartment rent based on income.  Agency Name: [facility]  Address: [facility], [address] [place], [place]  Phone: [phone]  Website: www.ccmeden.org Services Offered: Museum/gallery curator, utility assistance Technical Brewer for all of  [place], [employer], [place] [employer] , [employer]  and [employer] for [name] area only), rent assistance.  Agency Name: [facility] Address: [address] [place], [place], [place] [address] / [address]   March 31, 2023 13 Phone: [phone] [place] / [phone]   Website:  [email or web address] Services Offered: Civil Service Fast Streamer, food, showers, hygiene products utility payment  assistance, thrift shops, rental assistance, Support Groups  Agency Name: [facility]  Address: [address] [place], [place] [address]  Phone: [phone] / [phone]  Website: [email or web address]://www.[email or web address]/ Services Offered: Support groups for Bipolar, substance abuse, anger  management, panic depression and anxiety. Mobile crisis unit,  outpatient therapy, substance abuse treatment.  Agency Name: [employer] Address: [address] [place], [place] [address]  Phone: [phone]  Website: www.helpinc-[email or web address].org  Services Offered: Support groups for domestic violence or sexual assault, support  group for elderly women and domestic assault

## 2024-11-29 NOTE — Progress Notes (Signed)
 Nurse called safe transport for patient, but preacher came and is willing to transport him home. He left by wheelchair to main entrance for discharge. Both IVS were removed, tele was returned to front desk, and discharge paper was gone over with patient.

## 2024-11-29 NOTE — Progress Notes (Signed)
 Mobility Specialist Progress Note:    11/29/24 1025  Mobility  Activity Pivoted/transferred to/from BSC  Level of Assistance Moderate assist, patient does 50-74% (+2)  Assistive Device None  Distance Ambulated (ft) 2 ft  Range of Motion/Exercises Active;All extremities  Activity Response Tolerated well  Mobility Referral Yes  Mobility visit 1 Mobility  Mobility Specialist Start Time (ACUTE ONLY) 1025  Mobility Specialist Stop Time (ACUTE ONLY) 1043  Mobility Specialist Time Calculation (min) (ACUTE ONLY) 18 min   Pt received on BSC, requesting assistance back to bed. Required ModA +2 to stand and pivot with no AD. Tolerated well, pt wants something to help with BM. NT in room. All needs met  Rogelia Kluver Mobility Specialist Please contact via SecureChat or  Rehab office at [phone]

## 2024-11-29 NOTE — Discharge Summary (Signed)
 " Physician Discharge Summary   Patient: Taylor Obrien MRN: [id number] DOB: 20-Mar-1966  Admit date:     11/25/2024  Discharge date: 11/29/2024  Discharge Physician: Eric Nunnery   PCP: Trudy Ave, NP   Recommendations at discharge:  Reassess blood pressure and further adjust antihypertensive and as needed Repeat basic metabolic panel to follow electrolytes and renal function Patient will benefit of close outpatient follow-up with nephrology service, if already not arrange (slowly but steadily moving into further decline of renal failure with high chances of needing dialysis in near future).  Discharge Diagnoses: Principal Problem:   Acute hypoxemic respiratory failure (HCC) Active Problems:   CKD (chronic kidney disease) stage 4, GFR 15-29 ml/min (HCC)   NSTEMI (non-ST elevated myocardial infarction) (HCC)   Acute on chronic heart failure with reduced ejection fraction (HFrEF, <= 40%) (HCC)   Elevated troponin  Brief hospital course:   58 y.o. male with medical history significant for peripheral artery disease, history of CVA, wheelchair dependent since 2016, hypertension, type 2 diabetes, hyperlipidemia, chronic HFrEF, CKD 4, history of alcohol abuse and alcoholic pancreatitis, major depressive disorder, GERD, OSA intolerant of CPAP, who presented to the ER due to intermittent moderate to severe chest pain, sharp, centrally located, and radiating to his back.    Assessment and Plan: Acute hypoxic respiratory failure - Good saturation on room air appreciated on today's examination - No oxygen  supplementation needed at discharge - Continue the use of Demadex  to continue controlling volume. - Patient advised to maintain adequate hydration and to be compliant with low-sodium diet.   Acute exacerbation of chronic HFrEF - Elevated proBNP, hypoxia, edema noted on chest x-ray.   -Echo noting EF 30-35%.  - Continue IV diuresis - Continue Daily weight, adequate hydration and  low-sodium diet. -Patient has been discharged on Demadex  40 mg twice a day to continue assisting with volume management.   NSTEMI - Troponins elevated with only mild upward trend, 1086, 1298, 1368.  Likely ongoing demand ischemia in the setting of CKD4.  -Patient denies chest pain - Has completed 48 hours of heparin  drip. - Continue the use of aspirin , Plavix  and statin. - Continue outpatient follow-up with cardiology service -No chest pain at discharge.   Hypertensive emergency - Reportedly presented with BP as high as 264/155.   - Continue adjusted antihypertensive agents - Continue diuresis - Heart healthy/low-sodium diet discussed with patient. - Stable blood pressure appreciated at discharge.   CKD4 - Creatinine appears to be around baseline  - Continue minimizing nephrotoxic agents - Continue to follow renal function trend at time of discharge - Patient will benefit of follow-up with nephrology service as he is at high risk for requiring hemodialysis in the near future.   Hyperkalemia - Continue to closely follow electrolytes - Potassium is stable at discharge.   Physical debilitation muscle weakness/history of CVA/wheelchair dependent - Continue to be cautious with falls -Patient is wheelchair-bound at baseline. - Continue supportive care.     Consultants: None Procedures performed: See below for x-ray reports. Disposition: Home Diet recommendation: Heart healthy/low-sodium diet.  DISCHARGE MEDICATION: Allergies as of 11/29/2024       Reactions   Ibuprofen  Other (See Comments)   Avoid per PCP   Oxcarbazepine Other (See Comments)   Patient goes out of right state of mind.         Medication List     STOP taking these medications    amLODipine  10 MG tablet Commonly known as: NORVASC   TAKE these medications    acetaminophen  500 MG tablet Commonly known as: TYLENOL  Take 2 tablets (1,000 mg total) by mouth every 8 (eight) hours as needed for  mild pain (pain score 1-3), fever or headache.   aspirin  81 MG chewable tablet Chew 1 tablet (81 mg total) by mouth daily.   atorvastatin  80 MG tablet Commonly known as: LIPITOR  Take 1 tablet (80 mg total) by mouth daily.   calcitRIOL  0.25 MCG capsule Commonly known as: ROCALTROL  Take 1 capsule (0.25 mcg total) by mouth every Monday, Wednesday, and Friday.   carvedilol  25 MG tablet Commonly known as: COREG  Take 1 tablet (25 mg total) by mouth 2 (two) times daily with a meal.   clopidogrel  75 MG tablet Commonly known as: PLAVIX  Take 1 tablet (75 mg total) by mouth daily.   escitalopram  20 MG tablet Commonly known as: LEXAPRO  Take 20 mg by mouth daily.   fenofibrate  145 MG tablet Commonly known as: TRICOR  Take 145 mg by mouth daily.   isosorbide -hydrALAZINE  20-37.5 MG tablet Commonly known as: BIDIL  Take 2 tablets by mouth 3 (three) times daily.   lidocaine  5 % Commonly known as: LIDODERM  Place 2 patches onto the skin daily. Remove & Discard patch within 12 hours or as directed by MD What changed:  when to take this reasons to take this   melatonin 5 MG Tabs Take 0.5 tablets (2.5 mg total) by mouth at bedtime.   methocarbamol  500 MG tablet Commonly known as: ROBAXIN  Take 500 mg by mouth every 8 (eight) hours as needed.   nicotine  21 mg/24hr patch Commonly known as: NICODERM CQ  - dosed in mg/24 hours Place 1 patch (21 mg total) onto the skin daily at 6 (six) AM.   pantoprazole  40 MG tablet Commonly known as: PROTONIX  Take 1 tablet (40 mg total) by mouth at bedtime.   torsemide  20 MG tablet Commonly known as: DEMADEX  Take 2 tablets (40 mg total) by mouth 2 (two) times daily.   Vascepa  1 g capsule Generic drug: icosapent  Ethyl Take 2 g by mouth 2 (two) times daily.   Vitamin D  (Ergocalciferol ) 1.25 MG (50000 UNIT) Caps capsule Commonly known as: DRISDOL  Take 1 capsule (50,000 Units total) by mouth every 7 (seven) days.        Follow-up Information      Trudy Ave, NP. Schedule an appointment as soon as possible for a visit in 10 day(s).   Specialty: Nurse Practitioner Contact information: [address] [place] [address] [phone]                Discharge Exam: Fredricka Weights   11/26/24 0434 11/27/24 0500 11/28/24 0400  Weight: 74.4 kg 74.5 kg 73 kg    General exam: Alert, awake, oriented x 3; complaining of back pain; no chest pain, no nausea, no vomiting.  Reports breathing has improved and is not requiring oxygen  supplementation at the moment. Respiratory system: Decreased breath sounds at the bases, fine crackles appreciated ; no using accessory muscles.  Good saturation on room air. Cardiovascular system: Rate controlled, no rubs, no gallops, no JVD. Gastrointestinal system: Abdomen is nondistended, soft and nontender. No organomegaly or masses felt. Normal bowel sounds heard. Central nervous system: No new focal neurological deficits. Extremities: No cyanosis or clubbing. Skin: No petechiae. Psychiatry: Judgement and insight appear normal. Mood & affect appropriate.   Condition at discharge: Stable and improved.  The results of significant diagnostics from this hospitalization (including imaging, microbiology, ancillary and laboratory) are listed  below for reference.   Imaging Studies: ECHOCARDIOGRAM LIMITED Result Date: 11/26/2024    ECHOCARDIOGRAM LIMITED REPORT   Patient Name:   SONYA PUCCI Date of Exam: 11/26/2024 Medical Rec #:  [id number]     Height:       66.0 in Accession #:    [id number]    Weight:       164.0 lb Date of Birth:  1966/04/03     BSA:          1.838 m Patient Age:    58 years      BP:           163/110 mmHg Patient Gender: M             HR:           85 bpm. Exam Location:  Inpatient Procedure: Limited Echo, Cardiac Doppler, Color Doppler and Intracardiac            Opacification Agent (Both Spectral and Color Flow Doppler were            utilized during procedure).  Indications:    122-I22.9 Subsequent ST elevation (STEM) and non-ST elevation                 (NSTEMI) myocardial infarction  History:        Patient has prior history of Echocardiogram examinations, most                 recent 10/31/2024. Stroke, Signs/Symptoms:Shortness of Breath,                 Dyspnea, Chest Pain and Altered Mental Status; Risk                 Factors:Current Smoker, Sleep Apnea, Diabetes, Dyslipidemia and                 Hypertension. ETOH.  Sonographer:    Ellouise Mose RDCS Referring Phys: [id number] CAROLE N HALL  Sonographer Comments: Technically difficult study due to poor echo windows. IMPRESSIONS  1. Limited study.  2. Left ventricular ejection fraction, by estimation, is 30 to 35%. Left ventricular ejection fraction by 2D MOD biplane is 31.4 %. The left ventricle has moderately decreased function. The left ventricle demonstrates regional wall motion abnormalities (see scoring diagram/findings for description). There is moderate asymmetric left ventricular hypertrophy of the septal segment. Left ventricular diastolic function could not be evaluated. The average left ventricular global longitudinal strain is -12.1 %. The global longitudinal strain is abnormal.  3. Right ventricular systolic function is normal. The right ventricular size is normal. Tricuspid regurgitation signal is inadequate for assessing PA pressure.  4. The mitral valve is grossly normal. Mild mitral valve regurgitation.  5. The aortic valve was not well visualized.  6. The inferior vena cava is normal in size with greater than 50% respiratory variability, suggesting right atrial pressure of 3 mmHg. Comparison(s): Prior images reviewed side by side. LVEF 30-35% range at this point, similar wall motion abnormalties were present on the prior study as well. Mild mitral regurgitation. FINDINGS  Left Ventricle: Left ventricular ejection fraction, by estimation, is 30 to 35%. Left ventricular ejection fraction by 2D MOD biplane is  31.4 %. The left ventricle has moderately decreased function. The left ventricle demonstrates regional wall motion abnormalities. Definity  contrast agent was given IV to delineate the left ventricular endocardial borders. The average left ventricular global longitudinal strain is -12.1 %. Strain was performed and the global longitudinal strain is  abnormal. The left ventricular internal cavity size was normal in size. There is moderate asymmetric left ventricular hypertrophy of the septal segment. Left ventricular diastolic function could not be evaluated.  LV Wall Scoring: The basal inferolateral segment, basal anterolateral segment, and basal inferior segment are akinetic. The entire anterior wall, mid and distal lateral wall, entire septum, entire apex, mid and distal inferior wall, and mid anterolateral segment are hypokinetic. Right Ventricle: The right ventricular size is normal. No increase in right ventricular wall thickness. Right ventricular systolic function is normal. Tricuspid regurgitation signal is inadequate for assessing PA pressure. Pericardium: There is no evidence of pericardial effusion. Mitral Valve: The mitral valve is grossly normal. Mild mitral valve regurgitation. Tricuspid Valve: The tricuspid valve is grossly normal. Tricuspid valve regurgitation is trivial. No evidence of tricuspid stenosis. Aortic Valve: The aortic valve was not well visualized. Aorta: The aortic root and ascending aorta are structurally normal, with no evidence of dilitation. Venous: The inferior vena cava is normal in size with greater than 50% respiratory variability, suggesting right atrial pressure of 3 mmHg. Additional Comments: Spectral Doppler performed. Color Doppler performed.  LEFT VENTRICLE PLAX 2D                        Biplane EF (MOD) LVIDd:         4.80 cm         LV Biplane EF:   Left LVIDs:         4.30 cm                          ventricular LV PW:         1.40 cm                          ejection LV  IVS:        1.70 cm                          fraction by                                                 2D MOD                                                 biplane is LV Volumes (MOD)                                31.4 %. LV vol d, MOD    119.0 ml A2C: LV vol d, MOD    95.7 ml       2D Longitudinal A4C:                           Strain LV vol s, MOD    77.0 ml       2D Strain GLS   -12.1 % A2C:  Avg: LV vol s, MOD    66.2 ml A4C: LV SV MOD A2C:   42.0 ml LV SV MOD A4C:   95.7 ml LV SV MOD BP:    34.2 ml IVC IVC diam: 0.70 cm AORTIC VALVE LVOT Vmax:   106.00 cm/s LVOT Vmean:  64.500 cm/s LVOT VTI:    0.148 m  AORTA Ao Asc diam: 2.90 cm MITRAL VALVE MV Area (PHT): 4.49 cm     SHUNTS MV Decel Time: 169 msec     Systemic VTI: 0.15 m MV E velocity: 49.30 cm/s MV A velocity: 103.00 cm/s MV E/A ratio:  0.48 Jayson Sierras MD Electronically signed by Jayson Sierras MD Signature Date/Time: 11/26/2024/1:45:30 PM    Final    DG Chest Port 1 View Result Date: 11/25/2024 EXAM: 1 VIEW(S) XRAY OF THE CHEST 11/25/2024 07:32:09 PM COMPARISON: 11/15/2024 CLINICAL HISTORY: SOB FINDINGS: LUNGS AND PLEURA: Increased central vascular congestion with diffuse parenchymal opacity bilaterally consistent with edema. Small left pleural effusion is noted. No pneumothorax. HEART AND MEDIASTINUM: Cardiomegaly. Increased central vascular congestion. BONES AND SOFT TISSUES: No acute osseous abnormality. IMPRESSION: 1. Worsening congestive heart failure 2. Small left pleural effusion. Electronically signed by: Oneil Devonshire MD 11/25/2024 07:34 PM EST RP Workstation: [id number]   CT CERVICAL SPINE WO CONTRAST Result Date: 11/15/2024 EXAM: CT CERVICAL SPINE WITHOUT CONTRAST 11/15/2024 07:22:07 PM TECHNIQUE: CT of the cervical spine was performed without the administration of intravenous contrast. Multiplanar reformatted images are provided for review. Automated exposure control, iterative reconstruction, and/or  weight based adjustment of the mA/kV was utilized to reduce the radiation dose to as low as reasonably achievable. COMPARISON: CT spine 07/06/2018. CLINICAL HISTORY: Polytrauma, blunt. 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 No acute fracture or traumatic malalignment. DEGENERATIVE CHANGES: Multilevel mild-to-moderate degenerative changes of the spine. No associated severe osseous neural foraminal or central canal stenosis. SOFT TISSUES: No prevertebral soft tissue swelling. IMPRESSION: 1. No acute abnormality of the cervical spine. 2. 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 Electronically signed by: Morgane Naveau MD 11/15/2024 07:48 PM EST RP Workstation: [id number]   CT T-SPINE NO CHARGE Result Date: 11/15/2024 EXAM: CT THORACIC SPINE WITHOUT CONTRAST 11/15/2024 07:22:07 PM TECHNIQUE: CT of the thoracic spine was performed without the administration of intravenous contrast. Multiplanar reformatted images are provided for review. Automated exposure control, iterative reconstruction, and/or weight based adjustment of the mA/kV was utilized to reduce the radiation dose to as low as reasonably achievable. COMPARISON: MRI lumbar spine 06/27/2024. CLINICAL HISTORY: FINDINGS: BONES AND ALIGNMENT: Normal vertebral body heights except for T12. Chronic stable T12 supra endplate mild compression fracture with associated Schmorl's node. No acute fracture or suspicious bone lesion. Normal alignment. DEGENERATIVE CHANGES: Multilevel mild-to-moderate anterior osteophyte formation. SOFT TISSUES: No acute abnormality. IMPRESSION: 1. No acute findings. 2. Chronic stable T12 superior endplate mild compression fracture. 3. Please see separately dictated CT , abdomen, pelvis, lumbar spine 11/15/2024. Electronically signed by: Morgane Naveau MD 11/15/2024 07:36 PM EST RP Workstation: [id number]   CT  L-SPINE NO CHARGE Result Date: 11/15/2024 EXAM: CT OF THE LUMBAR SPINE WITHOUT CONTRAST 11/15/2024 07:22:07 PM TECHNIQUE: CT of the lumbar spine was performed without the administration of intravenous contrast. Multiplanar reformatted images are provided for review. Automated exposure control, iterative reconstruction, and/or weight based adjustment of the mA/kV was utilized to reduce the radiation dose to as low as reasonably achievable. COMPARISON: MRI lumbar spine 06/27/2024. CLINICAL  HISTORY: Please see separately dictated CT thoracic spine 11/15/2024. FINDINGS: BONES AND ALIGNMENT: Normal vertebral body heights. No acute fracture or suspicious bone lesion. Normal alignment. DEGENERATIVE CHANGES: Mild anterior osteophyte formation. SOFT TISSUES: No acute abnormality. IMPRESSION: 1. No acute findings. 2. Please see separately dictated CT chest, abdomen, pelvis, thoracic spine 11/15/2024. Electronically signed by: Morgane Naveau MD 11/15/2024 07:35 PM EST RP Workstation: [id number]   CT CHEST ABDOMEN PELVIS WO CONTRAST Result Date: 11/15/2024 EXAM: CT CHEST, ABDOMEN AND PELVIS WITHOUT CONTRAST 11/15/2024 07:22:07 PM TECHNIQUE: CT of the chest, abdomen and pelvis was performed without the administration of intravenous contrast. Multiplanar reformatted images are provided for review. Automated exposure control, iterative reconstruction, and/or weight based adjustment of the mA/kV was utilized to reduce the radiation dose to as low as reasonably achievable. COMPARISON: CT angio neurobiform 06/27/2024, CT abdomen and pelvis 04/29/2024. Please see separately dictated CT thoracolumbar spine 11/15/2024. CLINICAL HISTORY: Polytrauma, blunt. FINDINGS: CHEST: MEDIASTINUM AND LYMPH NODES: Heart and pericardium are unremarkable. The central airways are clear. No mediastinal, hilar or axillary lymphadenopathy.No gross hilar adenopathy with limited evaluation on this noncontrast study. 4-vessel coronary artery  calcifications. LUNGS AND PLEURA: No focal consolidation or pulmonary edema. No pleural effusion or pneumothorax. ABDOMEN AND PELVIS: LIVER: The liver is unremarkable. GALLBLADDER AND BILE DUCTS: Gallbladder is unremarkable. No biliary ductal dilatation. SPLEEN: No acute abnormality. PANCREAS: No acute abnormality. ADRENAL GLANDS: No acute abnormality. KIDNEYS, URETERS AND BLADDER: No stones in the kidneys or ureters. No hydronephrosis. No perinephric or periureteral stranding. Urinary bladder is unremarkable. Calcifications associated with the kidneys are likely vascular. GI AND BOWEL: Stomach demonstrates no acute abnormality. There is no bowel obstruction. No small or large bowel thickening or dilatation. The appendix is unremarkable. Colonic diverticulosis. Otherwise, the colon is unremarkable. REPRODUCTIVE ORGANS: No acute abnormality. PERITONEUM AND RETROPERITONEUM: No ascites. No free air. VASCULATURE: Aorta is normal in caliber. Severe atherosclerotic plaque of the abdominal aorta. Mild atherosclerotic plaque in the thoracic aorta. ABDOMINAL AND PELVIS LYMPH NODES: No lymphadenopathy. BONES AND SOFT TISSUES: No acute displaced rib fracture. No acute displaced fracture or dislocation of either shoulder or hips. No acute displaced fracture or diastasis of the bones of the pelvis. No acute sacral fracture. No large soft tissue hematoma. Please see separately dictated CT thoracolumbar spine 12.8.25 . No large soft tissue hematoma No focal soft tissue abnormality. IMPRESSION: 1. No acute findings related to trauma . 2. Please see separately dictated CT thoracolumbar spine 12.8.25 . 3. Other, non-acute and/or normal findings as above. Electronically signed by: Morgane Naveau MD 11/15/2024 07:34 PM EST RP Workstation: [id number]   CT HEAD WO CONTRAST Result Date: 11/15/2024 EXAM: CT HEAD WITHOUT CONTRAST 11/15/2024 07:22:07 PM TECHNIQUE: CT of the head was performed without the administration of intravenous  contrast. Automated exposure control, iterative reconstruction, and/or weight based adjustment of the mA/kV was utilized to reduce the radiation dose to as low as reasonably achievable. COMPARISON: None available. CLINICAL HISTORY: Head trauma, moderate-severe FINDINGS: BRAIN AND VENTRICLES: No acute hemorrhage. No evidence of acute infarct. 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 Chronic stable lacunar infarcts in left basal ganglia and pons. Patchy and confluent supratentorial white matter hypodensities, likely representing advanced chronic microvascular ischemic changes. No hydrocephalus. No extra-axial collection. No mass effect or midline shift. Atherosclerotic calcifications are present within the cavernous internal carotid arteries. ORBITS: No acute abnormality. SINUSES: No acute abnormality. SOFT TISSUES AND SKULL: No acute soft tissue abnormality. No skull fracture. IMPRESSION: 1. No acute intracranial  abnormality related to head trauma. Electronically signed by: Morgane Naveau MD 11/15/2024 07:29 PM EST RP Workstation: [id number]   DG Pelvis Portable Result Date: 11/15/2024 CLINICAL DATA:  Clemens out of wheelchair, lower back pain EXAM: PORTABLE PELVIS 1-2 VIEWS COMPARISON:  None Available. FINDINGS: Frontal view of the pelvis includes both hips. No acute displaced fracture, subluxation, or dislocation. Mild symmetrical bilateral hip osteoarthritis. Soft tissues are unremarkable. IMPRESSION: 1. Symmetrical bilateral hip osteoarthritis. 2. No acute fracture. Electronically Signed   By: Ozell Daring M.D.   On: 11/15/2024 18:10   DG Chest Port 1 View Result Date: 11/15/2024 CLINICAL DATA:  Clemens out of wheelchair, lower back pain EXAM: PORTABLE CHEST 1 VIEW COMPARISON:  10/30/2024 FINDINGS: The heart size and mediastinal contours are within normal limits. Both lungs are clear. The visualized skeletal structures are unremarkable. IMPRESSION: No active disease. Electronically Signed    By: Ozell Daring M.D.   On: 11/15/2024 18:09   US  RENAL Result Date: 10/31/2024 EXAM: US  Retroperitoneum Complete, Renal. 10/31/2024 10:51:00 AM TECHNIQUE: Real-time ultrasonography of the retroperitoneum renal was performed. COMPARISON: None available CLINICAL HISTORY: [id number] AKI (acute kidney injury) 409830 AKI (acute kidney injury) FINDINGS: FINDINGS: RIGHT KIDNEY/URETER: The right kidney measures 10.2 x 5.8 x 4.3 cm with a volume of 131.9 cc. Increased parenchymal echogenicity. No hydronephrosis. No calculus. No mass. LEFT KIDNEY/URETER: The left kidney measures 10.6 x 6.0 x 4.1 cm with a volume of 137.6 cc. Increased parenchymal echogenicity. No hydronephrosis. No calculus. No mass. BLADDER: The bladder appears normal with visualization of both ureteral jets. IMPRESSION: 1. Increased bilateral renal parenchymal echogenicity, compatible with medical renal disease 2. No signs of hydronephrosis. Electronically signed by: Waddell Calk MD 10/31/2024 11:20 AM EST RP Workstation: [id number]   ECHOCARDIOGRAM COMPLETE Result Date: 10/31/2024    ECHOCARDIOGRAM REPORT   Patient Name:   AMAHD MORINO Date of Exam: 10/31/2024 Medical Rec #:  [id number]     Height:       66.0 in Accession #:    [id number]    Weight:       220.0 lb Date of Birth:  [birth year]     BSA:          2.083 m Patient Age:    58 years      BP:           154/109 mmHg Patient Gender: M             HR:           104 bpm. Exam Location:  Inpatient Procedure: 2D Echo, Cardiac Doppler and Color Doppler (Both Spectral and Color            Flow Doppler were utilized during procedure). STAT ECHO Indications:    I50.31 Acute diastolic (congestive) heart failure  History:        Patient has prior history of Echocardiogram examinations, most                 recent 06/19/2023. Risk Factors:Hypertension, Dyslipidemia, ETOH,                 Diabetes and Sleep Apnea.  Sonographer:    Damien Senior RDCS Referring Phys: [id number] FONDA JAYSON SHARPS IMPRESSIONS  1.  Left ventricular ejection fraction, by estimation, is 30%. Left ventricular ejection fraction by 2D MOD biplane is 29.1 %. The left ventricle has moderately decreased function. The left ventricle demonstrates regional wall motion abnormalities (see scoring diagram/findings for description). There is moderate asymmetric left ventricular  hypertrophy of the septal segment. Indeterminate diastolic filling due to E-A fusion.  2. Right ventricular systolic function is normal. The right ventricular size is normal. Tricuspid regurgitation signal is inadequate for assessing PA pressure.  3. The mitral valve is degenerative. Mild mitral valve regurgitation. No evidence of mitral stenosis.  4. The aortic valve was not well visualized. Aortic valve regurgitation is not visualized. No aortic stenosis is present.  5. The inferior vena cava is normal in size with greater than 50% respiratory variability, suggesting right atrial pressure of 3 mmHg. FINDINGS  Left Ventricle: Left ventricular ejection fraction, by estimation, is 30%. Left ventricular ejection fraction by 2D MOD biplane is 29.1 %. The left ventricle has moderately decreased function. The left ventricle demonstrates regional wall motion abnormalities. The left ventricular internal cavity size was normal in size. There is moderate asymmetric left ventricular hypertrophy of the septal segment. Indeterminate diastolic filling due to E-A fusion.  LV Wall Scoring: The mid and distal lateral wall, posterior wall, and basal inferior segment are akinetic. The antero-lateral wall and mid inferior segment are hypokinetic. Right Ventricle: The right ventricular size is normal. Right vetricular wall thickness was not well visualized. Right ventricular systolic function is normal. Tricuspid regurgitation signal is inadequate for assessing PA pressure. Left Atrium: Left atrial size was normal in size. Right Atrium: Right atrial size was normal in size. Pericardium: There is no  evidence of pericardial effusion. Mitral Valve: The mitral valve is degenerative in appearance. Mild mitral valve regurgitation. No evidence of mitral valve stenosis. Tricuspid Valve: The tricuspid valve is normal in structure. Tricuspid valve regurgitation is trivial. No evidence of tricuspid stenosis. Aortic Valve: The aortic valve was not well visualized. Aortic valve regurgitation is not visualized. No aortic stenosis is present. Pulmonic Valve: The pulmonic valve was not well visualized. Pulmonic valve regurgitation is not visualized. No evidence of pulmonic stenosis. Aorta: The aortic root is normal in size and structure and the ascending aorta was not well visualized. Venous: The inferior vena cava is normal in size with greater than 50% respiratory variability, suggesting right atrial pressure of 3 mmHg. IAS/Shunts: No atrial level shunt detected by color flow Doppler.  LEFT VENTRICLE PLAX 2D                        Biplane EF (MOD) LVIDd:         4.90 cm         LV Biplane EF:   Left LVIDs:         4.50 cm                          ventricular LV PW:         1.10 cm                          ejection LV IVS:        1.40 cm                          fraction by LVOT diam:     1.97 cm                          2D MOD LV SV:         36  biplane is LV SV Index:   17                               29.1 %. LVOT Area:     3.05 cm  LV Volumes (MOD) LV vol d, MOD    128.0 ml A2C: LV vol d, MOD    122.0 ml A4C: LV vol s, MOD    85.0 ml A2C: LV vol s, MOD    93.9 ml A4C: LV SV MOD A2C:   43.0 ml LV SV MOD A4C:   122.0 ml LV SV MOD BP:    37.3 ml RIGHT VENTRICLE RV S prime:     14.60 cm/s TAPSE (M-mode): 1.7 cm LEFT ATRIUM             Index        RIGHT ATRIUM           Index LA diam:        4.63 cm 2.22 cm/m   RA Area:     14.30 cm LA Vol (A2C):   53.4 ml 25.64 ml/m  RA Volume:   32.40 ml  15.56 ml/m LA Vol (A4C):   72.3 ml 34.72 ml/m LA Biplane Vol: 65.0 ml 31.21 ml/m  AORTIC VALVE LVOT  Vmax:   101.00 cm/s LVOT Vmean:  66.400 cm/s LVOT VTI:    0.119 m  AORTA Ao Root diam: 3.14 cm  SHUNTS Systemic VTI:  0.12 m Systemic Diam: 1.97 cm Soyla Merck MD Electronically signed by Soyla Merck MD Signature Date/Time: 10/31/2024/9:05:11 AM    Final    DG Chest Port 1 View Result Date: 10/30/2024 EXAM: 1 VIEW(S) XRAY OF THE CHEST 10/30/2024 09:53:49 PM COMPARISON: Comparison study 01/29/2024. CLINICAL HISTORY: Peripheral edema and shortness of breath. FINDINGS: LUNGS AND PLEURA: Increased vascular congestion is noted with interstitial edema. No infiltrate is noted. No sizable effusion is seen. HEART AND MEDIASTINUM: Cardiac shadow is enlarged. Aortic calcifications are seen. BONES AND SOFT TISSUES: No bony abnormality is seen. IMPRESSION: 1. Increased vascular congestion with interstitial edema. No sizable effusion or infiltrate. Electronically signed by: Oneil Devonshire MD 10/30/2024 09:56 PM EST RP Workstation: [id number]    Microbiology: Results for orders placed or performed during the hospital encounter of 11/25/24  Resp panel by RT-PCR (RSV, Flu A&B, Covid) Anterior Nasal Swab     Status: None   Collection Time: 11/25/24  7:16 PM   Specimen: Anterior Nasal Swab  Result Value Ref Range Status   SARS Coronavirus 2 by RT PCR NEGATIVE NEGATIVE Final    Comment: (NOTE) SARS-CoV-2 target nucleic acids are NOT DETECTED.  The SARS-CoV-2 RNA is generally detectable in upper respiratory specimens during the acute phase of infection. The lowest concentration of SARS-CoV-2 viral copies this assay can detect is 138 copies/mL. A negative result does not preclude SARS-Cov-2 infection and should not be used as the sole basis for treatment or other patient management decisions. A negative result may occur with  improper specimen collection/handling, submission of specimen other than nasopharyngeal swab, presence of viral mutation(s) within the areas targeted by this assay, and inadequate  number of viral copies(<138 copies/mL). A negative result must be combined with clinical observations, patient history, and epidemiological information. The expected result is Negative.  Fact Sheet for Patients:  bloggercourse.com  Fact Sheet for Healthcare Providers:  seriousbroker.it  This test is no t yet approved or cleared by the [place]  FDA and  has been authorized for detection and/or diagnosis of SARS-CoV-2 by FDA under an Emergency Use Authorization (EUA). This EUA will remain  in effect (meaning this test can be used) for the duration of the COVID-19 declaration under Section 564(b)(1) of the Act, 21 U.S.C.section 360bbb-3(b)(1), unless the authorization is terminated  or revoked sooner.       Influenza A by PCR NEGATIVE NEGATIVE Final   Influenza B by PCR NEGATIVE NEGATIVE Final    Comment: (NOTE) The Xpert Xpress SARS-CoV-2/FLU/RSV plus assay is intended as an aid in the diagnosis of influenza from Nasopharyngeal swab specimens and should not be used as a sole basis for treatment. Nasal washings and aspirates are unacceptable for Xpert Xpress SARS-CoV-2/FLU/RSV testing.  Fact Sheet for Patients: bloggercourse.com  Fact Sheet for Healthcare Providers: seriousbroker.it  This test is not yet approved or cleared by the [place]  FDA and has been authorized for detection and/or diagnosis of SARS-CoV-2 by FDA under an Emergency Use Authorization (EUA). This EUA will remain in effect (meaning this test can be used) for the duration of the COVID-19 declaration under Section 564(b)(1) of the Act, 21 U.S.C. section 360bbb-3(b)(1), unless the authorization is terminated or revoked.     Resp Syncytial Virus by PCR NEGATIVE NEGATIVE Final    Comment: (NOTE) Fact Sheet for Patients: bloggercourse.com  Fact Sheet for Healthcare  Providers: seriousbroker.it  This test is not yet approved or cleared by the [place]  FDA and has been authorized for detection and/or diagnosis of SARS-CoV-2 by FDA under an Emergency Use Authorization (EUA). This EUA will remain in effect (meaning this test can be used) for the duration of the COVID-19 declaration under Section 564(b)(1) of the Act, 21 U.S.C. section 360bbb-3(b)(1), unless the authorization is terminated or revoked.  Performed at [facility], [address] [place], [place] [address]   MRSA Next Gen by PCR, Nasal     Status: None   Collection Time: 11/26/24 12:57 AM   Specimen: Nasal Mucosa; Nasal Swab  Result Value Ref Range Status   MRSA by PCR Next Gen NOT DETECTED NOT DETECTED Final    Comment: (NOTE) The GeneXpert MRSA Assay (FDA approved for NASAL specimens only), is one component of a comprehensive MRSA colonization surveillance program. It is not intended to diagnose MRSA infection nor to guide or monitor treatment for MRSA infections. Test performance is not FDA approved in patients less than 7 years old. Performed at [facility], [address] [place], [place] [address]     Labs: CBC: Recent Labs  Lab 11/25/24 1925 11/26/24 0506 11/27/24 0401 11/28/24 0432  WBC 11.4* 12.4* 8.2 6.6  NEUTROABS 8.8*  --   --   --   HGB 12.7* 10.7* 8.8* 9.7*  HCT 39.8 33.8* 27.0* 29.0*  MCV 100.8* 99.7 97.8 98.0  PLT 290 247 192 217   Basic Metabolic Panel: Recent Labs  Lab 11/25/24 1925 11/26/24 0506 11/26/24 1806 11/28/24 0432 11/29/24 0500  NA 142 140 137 142 140  K 4.8 6.2* 4.1 3.8 3.7  CL 107 107 105 105 101  CO2 16* 24 18* 25 28  GLUCOSE 217* 109* 134* 101* 90  BUN 39* 42* 46* 41* 42*  CREATININE 4.05* 4.37* 4.59* 4.50* 4.71*  CALCIUM  8.9 8.3* 8.4* 9.1 9.1  MG 1.9  --   --  1.8  --   PHOS  --  4.9*  --   --   --    Liver Function Tests: Recent Labs  Lab 11/26/24  0506 11/28/24 0432  AST 37 25  ALT 12 10   ALKPHOS 60 54  BILITOT 0.2 0.3  PROT 5.9* 6.1*  ALBUMIN 3.1* 3.2*   CBG: Recent Labs  Lab 11/28/24 2139 11/28/24 2354 11/29/24 0311 11/29/24 0741 11/29/24 1129  GLUCAP 122* 205* 74 114* 213*    Discharge time spent:  35 minutes.  Signed: Eric Nunnery, MD [name] 11/29/2024 "

## 2024-11-29 NOTE — Progress Notes (Addendum)
 Mobility Specialist Progress Note:    11/29/24 0950  Mobility  Activity Pivoted/transferred to/from [facility]  Level of Assistance Moderate assist, patient does 50-74%  Assistive Device BSC  Distance Ambulated (ft) 3 ft  Range of Motion/Exercises Active;All extremities  Activity Response Tolerated well  Mobility Referral Yes  Mobility visit 1 Mobility  Mobility Specialist Start Time (ACUTE ONLY) 0950  Mobility Specialist Stop Time (ACUTE ONLY) 1010  Mobility Specialist Time Calculation (min) (ACUTE ONLY) 20 min   Pt received supine, requesting assistance to [facility]. Required ModA to stand and pivot using BSC as support. Tolerated well, [name] flaccidity d/t previous CVA. Call bell in reach, all needs met.  Antanisha Mohs Mobility Specialist Please contact via Special Educational Needs Teacher or  Rehab office at [phone]

## 2024-12-02 ENCOUNTER — Observation Stay (HOSPITAL_COMMUNITY)
Admission: EM | Admit: 2024-12-02 | Discharge: 2024-12-03 | Disposition: A | Discharge status: Home / Self Care | Attending: Internal Medicine | Admitting: Internal Medicine

## 2024-12-02 ENCOUNTER — Other Ambulatory Visit: Payer: Self-pay

## 2024-12-02 DIAGNOSIS — E1122 Type 2 diabetes mellitus with diabetic chronic kidney disease: Secondary | ICD-10-CM | POA: Insufficient documentation

## 2024-12-02 DIAGNOSIS — E876 Hypokalemia: Secondary | ICD-10-CM

## 2024-12-02 DIAGNOSIS — I10 Essential (primary) hypertension: Secondary | ICD-10-CM | POA: Diagnosis not present

## 2024-12-02 DIAGNOSIS — I214 Non-ST elevation (NSTEMI) myocardial infarction: Secondary | ICD-10-CM | POA: Insufficient documentation

## 2024-12-02 DIAGNOSIS — N1831 Chronic kidney disease, stage 3a: Secondary | ICD-10-CM

## 2024-12-02 DIAGNOSIS — Z79899 Other long term (current) drug therapy: Secondary | ICD-10-CM | POA: Insufficient documentation

## 2024-12-02 DIAGNOSIS — E119 Type 2 diabetes mellitus without complications: Secondary | ICD-10-CM

## 2024-12-02 DIAGNOSIS — Z794 Long term (current) use of insulin: Secondary | ICD-10-CM | POA: Diagnosis not present

## 2024-12-02 DIAGNOSIS — I129 Hypertensive chronic kidney disease with stage 1 through stage 4 chronic kidney disease, or unspecified chronic kidney disease: Secondary | ICD-10-CM | POA: Insufficient documentation

## 2024-12-02 DIAGNOSIS — D631 Anemia in chronic kidney disease: Secondary | ICD-10-CM | POA: Diagnosis not present

## 2024-12-02 DIAGNOSIS — N184 Chronic kidney disease, stage 4 (severe): Secondary | ICD-10-CM | POA: Diagnosis not present

## 2024-12-02 DIAGNOSIS — Z7982 Long term (current) use of aspirin: Secondary | ICD-10-CM | POA: Insufficient documentation

## 2024-12-02 DIAGNOSIS — R112 Nausea with vomiting, unspecified: Secondary | ICD-10-CM | POA: Diagnosis not present

## 2024-12-02 DIAGNOSIS — R531 Weakness: Secondary | ICD-10-CM | POA: Diagnosis present

## 2024-12-02 LAB — URINALYSIS, ROUTINE W REFLEX MICROSCOPIC
Bilirubin Urine: NEGATIVE
Glucose, UA: NEGATIVE mg/dL
Hgb urine dipstick: NEGATIVE
Ketones, ur: NEGATIVE mg/dL
Nitrite: NEGATIVE
Protein, ur: >=300 mg/dL — AB
Specific Gravity, Urine: 1.016 (ref 1.005–1.030)
WBC, UA: >50 WBC/hpf (ref 0–5)
pH: 5 (ref 5.0–8.0)

## 2024-12-02 LAB — CBC WITH DIFFERENTIAL/PLATELET
Abs Immature Granulocytes: 0.02 K/uL (ref 0.00–0.07)
Basophils Absolute: 0 K/uL (ref 0.0–0.1)
Basophils Relative: 0 %
Eosinophils Absolute: 0.1 K/uL (ref 0.0–0.5)
Eosinophils Relative: 1 %
HCT: 29.8 % — ABNORMAL LOW (ref 39.0–52.0)
Hemoglobin: 9.7 g/dL — ABNORMAL LOW (ref 13.0–17.0)
Immature Granulocytes: 0 %
Lymphocytes Relative: 11 %
Lymphs Abs: 1 K/uL (ref 0.7–4.0)
MCH: 32.2 pg (ref 26.0–34.0)
MCHC: 32.6 g/dL (ref 30.0–36.0)
MCV: 99 fL (ref 80.0–100.0)
Monocytes Absolute: 0.5 K/uL (ref 0.1–1.0)
Monocytes Relative: 6 %
Neutro Abs: 7.4 K/uL (ref 1.7–7.7)
Neutrophils Relative %: 82 %
Platelets: 242 K/uL (ref 150–400)
RBC: 3.01 MIL/uL — ABNORMAL LOW (ref 4.22–5.81)
RDW: 13.7 % (ref 11.5–15.5)
WBC: 9 K/uL (ref 4.0–10.5)
nRBC: 0 % (ref 0.0–0.2)

## 2024-12-02 LAB — GLUCOSE, CAPILLARY
Glucose-Capillary: 130 mg/dL — ABNORMAL HIGH (ref 70–99)
Glucose-Capillary: 159 mg/dL — ABNORMAL HIGH (ref 70–99)

## 2024-12-02 LAB — COMPREHENSIVE METABOLIC PANEL WITH GFR
ALT: 7 U/L (ref 0–44)
AST: 15 U/L (ref 15–41)
Albumin: 2.6 g/dL — ABNORMAL LOW (ref 3.5–5.0)
Alkaline Phosphatase: 34 U/L — ABNORMAL LOW (ref 38–126)
Anion gap: 12 (ref 5–15)
BUN: 41 mg/dL — ABNORMAL HIGH (ref 6–20)
CO2: 17 mmol/L — ABNORMAL LOW (ref 22–32)
Calcium: 6.1 mg/dL — CL (ref 8.9–10.3)
Chloride: 116 mmol/L — ABNORMAL HIGH (ref 98–111)
Creatinine, Ser: 3.3 mg/dL — ABNORMAL HIGH (ref 0.61–1.24)
GFR, Estimated: 21 mL/min — ABNORMAL LOW
Glucose, Bld: 83 mg/dL (ref 70–99)
Potassium: 3.3 mmol/L — ABNORMAL LOW (ref 3.5–5.1)
Sodium: 145 mmol/L (ref 135–145)
Total Bilirubin: <0.2 mg/dL (ref 0.0–1.2)
Total Protein: 4.5 g/dL — ABNORMAL LOW (ref 6.5–8.1)

## 2024-12-02 LAB — LIPASE, BLOOD: Lipase: 22 U/L (ref 11–51)

## 2024-12-02 LAB — MAGNESIUM: Magnesium: 1.4 mg/dL — ABNORMAL LOW (ref 1.7–2.4)

## 2024-12-02 MED ORDER — CALCIUM GLUCONATE-NACL 1-0.675 GM/50ML-% IV SOLN
1.0000 g | Freq: Once | INTRAVENOUS | Status: AC
  Administered 2024-12-02: 1000 mg via INTRAVENOUS
  Filled 2024-12-02: qty 50

## 2024-12-02 MED ORDER — ACETAMINOPHEN 325 MG PO TABS
650.0000 mg | ORAL_TABLET | Freq: Four times a day (QID) | ORAL | Status: DC | PRN
  Administered 2024-12-02 – 2024-12-03 (×2): 650 mg via ORAL
  Filled 2024-12-02 (×2): qty 2

## 2024-12-02 MED ORDER — CALCITRIOL 0.25 MCG PO CAPS
0.2500 ug | ORAL_CAPSULE | ORAL | Status: DC
  Administered 2024-12-03: 0.25 ug via ORAL
  Filled 2024-12-02: qty 1

## 2024-12-02 MED ORDER — TORSEMIDE 20 MG PO TABS
40.0000 mg | ORAL_TABLET | Freq: Two times a day (BID) | ORAL | Status: DC
  Administered 2024-12-03: 40 mg via ORAL
  Filled 2024-12-02: qty 2

## 2024-12-02 MED ORDER — POTASSIUM CHLORIDE 20 MEQ PO PACK
40.0000 meq | PACK | Freq: Once | ORAL | Status: AC
  Administered 2024-12-02: 40 meq via ORAL
  Filled 2024-12-02: qty 2

## 2024-12-02 MED ORDER — ONDANSETRON HCL 4 MG/2ML IJ SOLN: 4.0000 mg | Freq: Four times a day (QID) | INTRAMUSCULAR | Status: DC | PRN

## 2024-12-02 MED ORDER — POTASSIUM CHLORIDE 20 MEQ PO PACK: 40.0000 meq | PACK | Freq: Once | ORAL | Status: DC

## 2024-12-02 MED ORDER — ATORVASTATIN CALCIUM 40 MG PO TABS
80.0000 mg | ORAL_TABLET | Freq: Every day | ORAL | Status: DC
  Administered 2024-12-02 – 2024-12-03 (×2): 80 mg via ORAL
  Filled 2024-12-02 (×2): qty 2

## 2024-12-02 MED ORDER — ISOSORB DINITRATE-HYDRALAZINE 20-37.5 MG PO TABS
2.0000 | ORAL_TABLET | Freq: Three times a day (TID) | ORAL | Status: DC
  Administered 2024-12-02 – 2024-12-03 (×2): 2 via ORAL
  Filled 2024-12-02 (×2): qty 2

## 2024-12-02 MED ORDER — ASPIRIN 81 MG PO CHEW
81.0000 mg | CHEWABLE_TABLET | Freq: Every day | ORAL | Status: DC
  Administered 2024-12-02 – 2024-12-03 (×2): 81 mg via ORAL
  Filled 2024-12-02 (×2): qty 1

## 2024-12-02 MED ORDER — POLYETHYLENE GLYCOL 3350 17 G PO PACK: 17.0000 g | PACK | Freq: Every day | ORAL | Status: DC | PRN

## 2024-12-02 MED ORDER — INSULIN ASPART 100 UNIT/ML IJ SOLN
0.0000 [IU] | Freq: Three times a day (TID) | INTRAMUSCULAR | Status: DC
  Administered 2024-12-03: 1 [IU] via SUBCUTANEOUS
  Filled 2024-12-02: qty 1

## 2024-12-02 MED ORDER — ONDANSETRON HCL 4 MG PO TABS: 4.0000 mg | ORAL_TABLET | Freq: Four times a day (QID) | ORAL | Status: DC | PRN

## 2024-12-02 MED ORDER — ACETAMINOPHEN 650 MG RE SUPP: 650.0000 mg | Freq: Four times a day (QID) | RECTAL | Status: DC | PRN

## 2024-12-02 MED ORDER — INSULIN ASPART 100 UNIT/ML IJ SOLN: 0.0000 [IU] | Freq: Every day | INTRAMUSCULAR | Status: DC

## 2024-12-02 MED ORDER — MAGNESIUM SULFATE 2 GM/50ML IV SOLN
2.0000 g | Freq: Once | INTRAVENOUS | Status: AC
  Administered 2024-12-02: 2 g via INTRAVENOUS
  Filled 2024-12-02: qty 50

## 2024-12-02 MED ORDER — MAGNESIUM OXIDE -MG SUPPLEMENT 400 (240 MG) MG PO TABS
800.0000 mg | ORAL_TABLET | Freq: Once | ORAL | Status: AC
  Administered 2024-12-02: 800 mg via ORAL
  Filled 2024-12-02: qty 2

## 2024-12-02 MED ORDER — HEPARIN SODIUM (PORCINE) 5000 UNIT/ML IJ SOLN
5000.0000 [IU] | Freq: Three times a day (TID) | INTRAMUSCULAR | Status: DC
  Administered 2024-12-02 – 2024-12-03 (×3): 5000 [IU] via SUBCUTANEOUS
  Filled 2024-12-02 (×3): qty 1

## 2024-12-02 MED ORDER — ONDANSETRON HCL 4 MG/2ML IJ SOLN
4.0000 mg | Freq: Once | INTRAMUSCULAR | Status: AC
  Administered 2024-12-02: 4 mg via INTRAVENOUS
  Filled 2024-12-02: qty 2

## 2024-12-02 MED ORDER — CARVEDILOL 12.5 MG PO TABS
25.0000 mg | ORAL_TABLET | Freq: Two times a day (BID) | ORAL | Status: DC
  Administered 2024-12-03: 25 mg via ORAL
  Filled 2024-12-02: qty 2

## 2024-12-02 NOTE — ED Provider Notes (Signed)
 " [place] [facility] AT [facility] Provider Note   CSN: [id number] Arrival date & time: 12/02/24  1359     Patient presents with: Emesis   Taylor WITUCKI is a 58 y.o. male with a past medical history of HTN, HLD, EtOH abuse, marijuana use, OSA, CVA, CKD stage III, DM, HFrEF, NSTEMI presents to ED for evaluation of 1 episode of vomiting 1 hour following Christmas dinner.  Reports that he had beans, mac & cheese, ham.  No other family members who also had this meal had any nausea, vomiting following.  Following vomiting and arrival to ED, patient reports complete resolution of symptoms and feels at baseline.  Has been passing gas.  Last BM was today and normal.  Does have a history of EtOH abuse and last drink EtOH 2 months ago. No complaints prior to today and felt at baseline this morning. Was recently DC on 11/29/24 for NSTEMI, respiratory failure, acute on chronic HF. Currently taking ASA, plavix , statin and report compliance. Denies CP, SHOB, abd pain, fevers.    Emesis      Prior to Admission medications  Medication Sig Start Date End Date Taking? Authorizing Provider  acetaminophen  (TYLENOL ) 500 MG tablet Take 2 tablets (1,000 mg total) by mouth every 8 (eight) hours as needed for mild pain (pain score 1-3), fever or headache. 11/29/24   Ricky Fines, MD  aspirin  81 MG chewable tablet Chew 1 tablet (81 mg total) by mouth daily. 06/27/24   Daralene Lonni BIRCH, PA-C  atorvastatin  (LIPITOR ) 80 MG tablet Take 1 tablet (80 mg total) by mouth daily. Patient not taking: Reported on 11/28/2024 04/26/24   Jadapalle, Sree, MD  calcitRIOL  (ROCALTROL ) 0.25 MCG capsule Take 1 capsule (0.25 mcg total) by mouth every Monday, Wednesday, and Friday. 11/08/24 02/06/25  Pokhrel, Laxman, MD  carvedilol  (COREG ) 25 MG tablet Take 1 tablet (25 mg total) by mouth 2 (two) times daily with a meal. 11/06/24 02/04/25  Pokhrel, Vernal, MD  clopidogrel  (PLAVIX ) 75 MG tablet Take 1 tablet (75  mg total) by mouth daily. Patient not taking: Reported on 11/28/2024 06/27/24   Daralene Lonni BIRCH, PA-C  escitalopram  (LEXAPRO ) 20 MG tablet Take 20 mg by mouth daily. 07/30/22   [provider]  fenofibrate  (TRICOR ) 145 MG tablet Take 145 mg by mouth daily. 04/10/22   [provider]  isosorbide -hydrALAZINE  (BIDIL ) 20-37.5 MG tablet Take 2 tablets by mouth 3 (three) times daily. 11/06/24 02/04/25  Pokhrel, Vernal, MD  lidocaine  (LIDODERM ) 5 % Place 2 patches onto the skin daily. Remove & Discard patch within 12 hours or as directed by MD Patient taking differently: Place 2 patches onto the skin daily as needed (Pain). Remove & Discard patch within 12 hours or as directed by MD 04/26/24   Jadapalle, Sree, MD  melatonin 5 MG TABS Take 0.5 tablets (2.5 mg total) by mouth at bedtime. Patient not taking: Reported on 11/28/2024 04/26/24   Jadapalle, Sree, MD  methocarbamol  (ROBAXIN ) 500 MG tablet Take 500 mg by mouth every 8 (eight) hours as needed. 11/16/24   [provider]  nicotine  (NICODERM CQ  - DOSED IN MG/24 HOURS) 21 mg/24hr patch Place 1 patch (21 mg total) onto the skin daily at 6 (six) AM. Patient not taking: Reported on 11/28/2024 04/26/24   Donnelly Mellow, MD  pantoprazole  (PROTONIX ) 40 MG tablet Take 1 tablet (40 mg total) by mouth at bedtime. Patient not taking: Reported on 11/28/2024 01/19/16   Carilyn Prentice BRAVO, MD  torsemide  (DEMADEX ) 20 MG tablet Take 2 tablets (40 mg total) by mouth 2 (two) times daily. 11/29/24   Ricky Fines, MD  VASCEPA  1 g capsule Take 2 g by mouth 2 (two) times daily. Patient not taking: Reported on 11/28/2024 03/12/22   [provider]  Vitamin D , Ergocalciferol , (DRISDOL ) 1.25 MG (50000 UNIT) CAPS capsule Take 1 capsule (50,000 Units total) by mouth every 7 (seven) days. 11/10/24   Pokhrel, Laxman, MD    Allergies: Ibuprofen  and Oxcarbazepine    Review of Systems  Gastrointestinal:  Positive for vomiting.    Updated  Vital Signs BP (!) 138/90   Pulse 69   Temp 98.3 F (36.8 C) (Oral)   Resp 16   Ht 5' 6 (1.676 m)   Wt 72.6 kg   SpO2 96%   BMI 25.82 kg/m   Physical Exam Vitals and nursing note reviewed.  Constitutional:      General: He is not in acute distress.    Appearance: Normal appearance.  HENT:     Head: Normocephalic and atraumatic.  Eyes:     Conjunctiva/sclera: Conjunctivae normal.  Cardiovascular:     Rate and Rhythm: Normal rate.  Pulmonary:     Effort: Pulmonary effort is normal. No respiratory distress.     Breath sounds: Normal breath sounds.  Abdominal:     General: Bowel sounds are normal. There is no distension.     Palpations: Abdomen is soft.     Tenderness: There is no abdominal tenderness. There is no guarding or rebound.  Skin:    Coloration: Skin is not jaundiced or pale.  Neurological:     Mental Status: He is alert and oriented to person, place, and time. Mental status is at baseline.     (all labs ordered are listed, but only abnormal results are displayed) Labs Reviewed  CBC WITH DIFFERENTIAL/PLATELET - Abnormal; Notable for the following components:      Result Value   RBC 3.01 (*)    Hemoglobin 9.7 (*)    HCT 29.8 (*)    All other components within normal limits  COMPREHENSIVE METABOLIC PANEL WITH GFR - Abnormal; Notable for the following components:   Potassium 3.3 (*)    Chloride 116 (*)    CO2 17 (*)    BUN 41 (*)    Creatinine, Ser 3.30 (*)    Calcium  6.1 (*)    Total Protein 4.5 (*)    Albumin 2.6 (*)    Alkaline Phosphatase 34 (*)    GFR, Estimated 21 (*)    All other components within normal limits  URINALYSIS, ROUTINE W REFLEX MICROSCOPIC - Abnormal; Notable for the following components:   APPearance HAZY (*)    Protein, ur >=300 (*)    Leukocytes,Ua LARGE (*)    Bacteria, UA RARE (*)    All other components within normal limits  MAGNESIUM  - Abnormal; Notable for the following components:   Magnesium  1.4 (*)    All other  components within normal limits  URINE CULTURE  LIPASE, BLOOD    EKG: EKG Interpretation Date/Time:  Thursday December 02 2024 14:42:04 EST Ventricular Rate:  71 PR Interval:  171 QRS Duration:  115 QT Interval:  444 QTC Calculation: 483 R Axis:   7  Text Interpretation: Sinus rhythm Multiple ventricular premature complexes Biatrial enlargement LVH with IVCD and secondary repol abnrm Inferior infarct, old Confirmed by Ula Barter [phone]) on 12/02/2024 3:42:28 PM  Radiology: No results found.   .Critical Care  Performed by: Minnie Tinnie BRAVO, PA Authorized by: Minnie Tinnie BRAVO, PA   Critical care provider statement:    Critical care time (minutes):  30   Critical care was time spent personally by me on the following activities:  Development of treatment plan with patient or surrogate, discussions with consultants, evaluation of patient's response to treatment, examination of patient, ordering and review of laboratory studies, ordering and review of radiographic studies, ordering and performing treatments and interventions, pulse oximetry, re-evaluation of patient's condition and review of old charts (hypocalcemia, hypomagnesemia)   Care discussed with: admitting provider      Medications Ordered in the ED  potassium chloride  (KLOR-CON ) packet 40 mEq (has no administration in time range)  calcium  gluconate 1 g/ 50 mL sodium chloride  IVPB (0 mg Intravenous Stopped 12/02/24 1755)  ondansetron  (ZOFRAN ) injection 4 mg (4 mg Intravenous Given 12/02/24 1603)  magnesium  oxide (MAG-OX) tablet 800 mg (800 mg Oral Given 12/02/24 1745)    Clinical Course as of 12/02/24 1855  Thu Dec 02, 2024  1544 Calcium (!!): 6.1 Corrected calcium  7.2. no prolonged QT but does have occasional PVCs on EKG [LB]    Clinical Course User Index [LB] Minnie Tinnie BRAVO, PA                                 Medical Decision Making Amount and/or Complexity of Data Reviewed Labs: ordered. Decision-making details  documented in ED Course.  Risk OTC drugs. Prescription drug management. Decision regarding hospitalization.   Patient presents to the ED for concern of vomiting, this involves an extensive number of treatment options, and is a complaint that carries with it a high risk of complications and morbidity.  The differential diagnosis includes infection, electrolyte abnormality, ACS, UTI, abdominal infection, pancreatitis, alcohol intoxication, gastroenteritis   Co morbidities that complicate the patient evaluation  Recent admission for NSTEMI, HF, resp failure recently placed on lasix    Additional history obtained:  Additional history obtained from Nursing, Outside Medical Records, and Past Admission   External records from outside source obtained and reviewed including triage RN note, recent admission   Lab Tests:  I Ordered, and personally interpreted labs.  The pertinent results include:   Magnesium  1.4 Corrected calcium  7.2 Creatinine 3.30 BUN 41 Potassium 3.3 Hemoglobin 9.7 Lipase WNL UA with protein, large leuks, WBC, rare bacteria     Cardiac Monitoring:  The patient was maintained on a cardiac monitor.  I personally viewed and interpreted the cardiac monitored which showed an underlying rhythm of: LVH with PVCs.  Similar to previous EKG 3 days ago   Medicines ordered and prescription drug management:  I ordered medication including Zofran , calcium , magnesium  for N/V, hypocalcemia, hypomagnesia Reevaluation of the patient after these medicines showed that the patient improved I have reviewed the patients home medicines and have made adjustments as needed    Consultations obtained  I ordered consultation, discussed ED workup, disposition.  Dr. Pearlean accepts patient for admission     Problem List / ED Course:  NV Vital signs hemodynamic stable with no fever no tachycardia Reports baseline after having 1 episode of vomiting at home.  No complaints of  nausea nor abdominal pain.  No abdominal tenderness on exam. Did endorse some nausea after obtaining HPI.  Provided Zofran . No provide any complaints of chest pain, shortness of breath.  No pedal edema.  Does not appear fluid overloaded.  EKG notable for some PVCs  but otherwise not different from previous EKG 3 days ago.  Doubt ACS at this time Doubt food poisoning as everyone else who had food at Christmas dinner today had no symptoms of nausea, vomiting. Doubt obstruction, perforation with no diarrhea.  Is passing gas.  No abdominal tenderness. UA notable for leukocytes, WBC, rare bacteria. No urinary complaints and has had similar UA for past one month. Will obtain urine culture  Hypocalcemia Corrected calcium  7.2 in setting of hypoalbumin of 2.6 Provide IV supplementation Likely secondary to recent torsemide  prescription of 40 mg twice daily for acute respiratory failure in setting of HFrEF found on last admission  Hypomagnesia 1.4 Provided p.o. supplementation in ED  Hypokalemia 3.3 Provided p.o. supplementation in ED   Reevaluation:  After the interventions noted above, I reevaluated the patient and found that they have :resolved    Dispostion:  After consideration of the diagnostic results and the patients response to treatment, I feel that the patent would benefit from admission for hypocalcemia, hypomagnesia.   Discussed ED workup, disposition with patient expressed understanding with the plan.  All questions answered to his satisfaction.  He is agreeable to admission  Final diagnoses:  Nausea and vomiting, unspecified vomiting type  Hypocalcemia  Hypomagnesemia    ED Discharge Orders     None        Minnie Tinnie BRAVO, PA 12/02/24 1857    Ula Prentice SAUNDERS, MD 12/10/24 0017  "

## 2024-12-02 NOTE — ED Notes (Signed)
 Pt passed PO challenge. Pt tolerated well with graham crackers and ginger ale

## 2024-12-02 NOTE — H&P (Signed)
 " History and Physical    Taylor Obrien FMW:[id number] DOB: 04-18-1966 DOA: 12/02/2024  PCP: Trudy Ave, NP   Patient coming from: Home  I have personally briefly reviewed patient's old medical records in [facility]  Chief Complaint: Vomiting  HPI: Taylor Obrien is a 58 y.o. male with medical history significant for hypertension, alcoholic pancreatitis, systolic and diastolic CHF, CKD 3B/4, stroke, diabetes mellitus, OSA. Patient presented to the ED with complaints of dizziness, and vomiting 1 hour after he had a Christmas dinner.  He reports a large amount of vomitus.  Abdominal pain.  No diarrhea.  No fevers no chills.  He denies alcohol abuse.  He denies pain with urination or frequency, reports at baseline it takes a long time to void.  Recent hospitalization 12/18 to 12/22 for hypoxia, CHF exacerbation, hypertensive emergency, NSTEMI with troponins elevated to 1368.  Likely demand ischemia in the setting of CKD.  Completed 48 hours of heparin .  Was discharged to continue aspirin  Plavix  and statin and follow-up with cardiology.  ED Course: Stable vitals.  Calcium  6.1, corrected for albumin was 7.2.  Potassium 3.3.  Magnesium  1.4. Electrolyte supplementation started.  Hospitalist to admit for hypocalcemia hypomagnesemia and hypokalemia.  Review of Systems: As per HPI all other systems reviewed and negative.  Past Medical History:  Diagnosis Date   Acute alcoholic pancreatitis    Arthritis    Chronic pain    Depression    Diabetes mellitus without complication (HCC)    Essential hypertension, benign    GERD (gastroesophageal reflux disease)    Headache    HTN (hypertension) 11/27/2015   Hyperlipidemia    Peripheral vascular disease    Sleep apnea    could not tolerate CPAP   Stroke (HCC)    2016    Past Surgical History:  Procedure Laterality Date   BIOPSY  08/19/2022   Procedure: BIOPSY;  Surgeon: Cindie Carlin POUR, DO;  Location: [facility];  Service:  Endoscopy;;   CLOSED REDUCTION MANDIBULAR FRACTURE W/ ARCH BARS     + multiple extractions   COLONOSCOPY WITH PROPOFOL  N/A 08/19/2022   Procedure: COLONOSCOPY WITH PROPOFOL ;  Surgeon: Cindie Carlin POUR, DO;  Location: [facility];  Service: Endoscopy;  Laterality: N/A;  9:15am   Condyloma resection     ESOPHAGOGASTRODUODENOSCOPY (EGD) WITH PROPOFOL  N/A 08/19/2022   Procedure: ESOPHAGOGASTRODUODENOSCOPY (EGD) WITH PROPOFOL ;  Surgeon: Cindie Carlin POUR, DO;  Location: [facility];  Service: Endoscopy;  Laterality: N/A;   MULTIPLE EXTRACTIONS WITH ALVEOLOPLASTY Bilateral 01/23/2018   Procedure: DENTAL EXTRACTION OF TEETH NUMBER ONE, TWO, THREE, FOUR, FIVE, SIX, SEVEN, EIGHT, NINE, TEN, ELEVEN, TWELVE, THIRTEEN, FOURTEEN, FIFTEEN, SIXTEEN, SEVENTEEN, TWENTY, TWENTY-ONE, TWENTY-TWO, TWENTY-THREE, TWENTY-FOUR, TWENTY-FIVE, TWENTY-SIX, TWENTY-SEVEN, TWENTY-EIGHT, TWENTY-NINE, THIRTY-TWO WITH ALVEOLOPLASTY;  Surgeon: Sheryle Hamilton, DDS;  Location: [facility] OR;  Service: Oral Surgery;  Lat   POLYPECTOMY  08/19/2022   Procedure: POLYPECTOMY;  Surgeon: Cindie Carlin POUR, DO;  Location: [facility];  Service: Endoscopy;;   RADIOLOGY WITH ANESTHESIA N/A 11/18/2015   Procedure: RADIOLOGY WITH ANESTHESIA;  Surgeon: Thyra Nash, MD;  Location: [facility] OR;  Service: Radiology;  Laterality: N/A;   Removal foreign body right shoulder     Right rotator cuff repair     TEE WITHOUT CARDIOVERSION N/A 11/21/2015   Procedure: TRANSESOPHAGEAL ECHOCARDIOGRAM (TEE);  Surgeon: Ezra GORMAN Shuck, MD;  Location: [facility] END[facility]COPY;  Service: Cardiovascular;  Laterality: N/A;   TOOTH EXTRACTION N/A 01/24/2018   Procedure: SUTURE ORAL WOUND;  Surgeon:  Sheryle Hamilton, DDS;  Location: [facility] OR;  Service: Oral Surgery;  Laterality: N/A;     reports that he has been smoking cigarettes. He has a 15 pack-year smoking history. He has never used smokeless tobacco. He reports that he does not currently use alcohol. He reports current drug use. Drug:  Marijuana.  Allergies[1]  Family History  Problem Relation Age of Onset   Diabetes Mother    Hypertension Mother    Drug abuse Mother    Hyperlipidemia Mother    Diabetes Father    Hypertension Father    Hyperlipidemia Father    Diabetes Brother    Hypertension Brother     Prior to Admission medications  Medication Sig Start Date End Date Taking? Authorizing Provider  acetaminophen  (TYLENOL ) 500 MG tablet Take 2 tablets (1,000 mg total) by mouth every 8 (eight) hours as needed for mild pain (pain score 1-3), fever or headache. 11/29/24   Ricky Fines, MD  aspirin  81 MG chewable tablet Chew 1 tablet (81 mg total) by mouth daily. 06/27/24   Daralene Lonni BIRCH, PA-C  atorvastatin  (LIPITOR ) 80 MG tablet Take 1 tablet (80 mg total) by mouth daily. Patient not taking: Reported on 11/28/2024 04/26/24   Jadapalle, Sree, MD  calcitRIOL  (ROCALTROL ) 0.25 MCG capsule Take 1 capsule (0.25 mcg total) by mouth every Monday, Wednesday, and Friday. 11/08/24 02/06/25  Pokhrel, Laxman, MD  carvedilol  (COREG ) 25 MG tablet Take 1 tablet (25 mg total) by mouth 2 (two) times daily with a meal. 11/06/24 02/04/25  Pokhrel, Vernal, MD  clopidogrel  (PLAVIX ) 75 MG tablet Take 1 tablet (75 mg total) by mouth daily. Patient not taking: Reported on 11/28/2024 06/27/24   Daralene Lonni BIRCH, PA-C  escitalopram  (LEXAPRO ) 20 MG tablet Take 20 mg by mouth daily. 07/30/22   [provider]  fenofibrate  (TRICOR ) 145 MG tablet Take 145 mg by mouth daily. 04/10/22   [provider]  isosorbide -hydrALAZINE  (BIDIL ) 20-37.5 MG tablet Take 2 tablets by mouth 3 (three) times daily. 11/06/24 02/04/25  Pokhrel, Vernal, MD  lidocaine  (LIDODERM ) 5 % Place 2 patches onto the skin daily. Remove & Discard patch within 12 hours or as directed by MD Patient taking differently: Place 2 patches onto the skin daily as needed (Pain). Remove & Discard patch within 12 hours or as directed by MD 04/26/24   Jadapalle, Sree, MD   melatonin 5 MG TABS Take 0.5 tablets (2.5 mg total) by mouth at bedtime. Patient not taking: Reported on 11/28/2024 04/26/24   Jadapalle, Sree, MD  methocarbamol  (ROBAXIN ) 500 MG tablet Take 500 mg by mouth every 8 (eight) hours as needed. 11/16/24   [provider]  nicotine  (NICODERM CQ  - DOSED IN MG/24 HOURS) 21 mg/24hr patch Place 1 patch (21 mg total) onto the skin daily at 6 (six) AM. Patient not taking: Reported on 11/28/2024 04/26/24   Donnelly Mellow, MD  pantoprazole  (PROTONIX ) 40 MG tablet Take 1 tablet (40 mg total) by mouth at bedtime. Patient not taking: Reported on 11/28/2024 01/19/16   Carilyn Prentice BRAVO, MD  torsemide  (DEMADEX ) 20 MG tablet Take 2 tablets (40 mg total) by mouth 2 (two) times daily. 11/29/24   Ricky Fines, MD  VASCEPA  1 g capsule Take 2 g by mouth 2 (two) times daily. Patient not taking: Reported on 11/28/2024 03/12/22   [provider]  Vitamin D , Ergocalciferol , (DRISDOL ) 1.25 MG (50000 UNIT) CAPS capsule Take 1 capsule (50,000 Units total) by mouth every 7 (seven) days. 11/10/24   Pokhrel,  Vernal, MD    Physical Exam: Vitals:   12/02/24 1445 12/02/24 1530 12/02/24 1600 12/02/24 1630  BP: 129/81 138/87 (!) 153/95 (!) 138/90  Pulse: 73 70 71 69  Resp: 18 20 18 16   Temp: 98.3 F (36.8 C)     TempSrc: Oral     SpO2: 96% 98% 98% 96%  Weight: 72.6 kg     Height: 5' 6 (1.676 m)       Constitutional: NAD, calm, comfortable Vitals:   12/02/24 1445 12/02/24 1530 12/02/24 1600 12/02/24 1630  BP: 129/81 138/87 (!) 153/95 (!) 138/90  Pulse: 73 70 71 69  Resp: 18 20 18 16   Temp: 98.3 F (36.8 C)     TempSrc: Oral     SpO2: 96% 98% 98% 96%  Weight: 72.6 kg     Height: 5' 6 (1.676 m)      Eyes: PERRL, lids and conjunctivae normal ENMT: Mucous membranes are moist.   Neck: normal, supple, no masses, no thyromegaly Respiratory: clear to auscultation bilaterally, no wheezing, no crackles. Normal respiratory effort. No accessory muscle use.   Cardiovascular: Regular rate and rhythm, no murmurs / rubs / gallops. No extremity edema. 2+ pedal pulses. No carotid bruits.  Abdomen: no tenderness, no masses palpated. No hepatosplenomegaly. Bowel sounds positive.  Musculoskeletal: no clubbing / cyanosis. No joint deformity upper and lower extremities.  Skin: no rashes, lesions, ulcers. No induration Neurologic: No facial asymmetry, management to spontaneously, speech fluent. Psychiatric: Normal judgment and insight. Alert and oriented x 3. Normal mood.   Labs on Admission: I have personally reviewed following labs and imaging studies  CBC: Recent Labs  Lab 11/26/24 0506 11/27/24 0401 11/28/24 0432 12/02/24 1456  WBC 12.4* 8.2 6.6 9.0  NEUTROABS  --   --   --  7.4  HGB 10.7* 8.8* 9.7* 9.7*  HCT 33.8* 27.0* 29.0* 29.8*  MCV 99.7 97.8 98.0 99.0  PLT 247 192 217 242   Basic Metabolic Panel: Recent Labs  Lab 11/26/24 0506 11/26/24 1806 11/28/24 0432 11/29/24 0500 12/02/24 1456  NA 140 137 142 140 145  K 6.2* 4.1 3.8 3.7 3.3*  CL 107 105 105 101 116*  CO2 24 18* 25 28 17*  GLUCOSE 109* 134* 101* 90 83  BUN 42* 46* 41* 42* 41*  CREATININE 4.37* 4.59* 4.50* 4.71* 3.30*  CALCIUM  8.3* 8.4* 9.1 9.1 6.1*  MG  --   --  1.8  --  1.4*  PHOS 4.9*  --   --   --   --    GFR: Estimated Creatinine Clearance: 22.6 mL/min (A) (by C-G formula based on SCr of 3.3 mg/dL (H)). Liver Function Tests: Recent Labs  Lab 11/26/24 0506 11/28/24 0432 12/02/24 1456  AST 37 25 15  ALT 12 10 7   ALKPHOS 60 54 34*  BILITOT 0.2 0.3 <0.2  PROT 5.9* 6.1* 4.5*  ALBUMIN 3.1* 3.2* 2.6*   Recent Labs  Lab 12/02/24 1456  LIPASE 22   BNP (last 3 results) Recent Labs    10/30/24 2210 11/25/24 1925  PROBNP 6,550.0* 5,832.0*   CBG: Recent Labs  Lab 11/28/24 2139 11/28/24 2354 11/29/24 0311 11/29/24 0741 11/29/24 1129  GLUCAP 122* 205* 74 114* 213*   Urine analysis:    Component Value Date/Time   COLORURINE YELLOW 12/02/2024 1543    APPEARANCEUR HAZY (A) 12/02/2024 1543   LABSPEC 1.016 12/02/2024 1543   PHURINE 5.0 12/02/2024 1543   GLUCOSEU NEGATIVE 12/02/2024 1543   HGBUR NEGATIVE 12/02/2024  1543   BILIRUBINUR NEGATIVE 12/02/2024 1543   KETONESUR NEGATIVE 12/02/2024 1543   PROTEINUR >=300 (A) 12/02/2024 1543   UROBILINOGEN 0.2 09/22/2015 0750   NITRITE NEGATIVE 12/02/2024 1543   LEUKOCYTESUR LARGE (A) 12/02/2024 1543    Radiological Exams on Admission: No results found.  EKG: Sinus rhythm, PVCs present.  QTc 483.  Rate 71.  Assessment/Plan Principal Problem:   Hypocalcemia Active Problems:   Diabetes mellitus (HCC)   Essential hypertension   CKD (chronic kidney disease) stage 4, GFR 15-29 ml/min (HCC)   NSTEMI (non-ST elevated myocardial infarction) (HCC)   Assessment and Plan:  Hypocalcemia, hypomagnesemia hypokalemia-calcium  6.1, corrected for albumin 7.2, potassium 3.3, magnesium  1.4.  1 episode of vomiting today resolved.  Was discharged on Demadex  during recent hospitalization. -Repeat electrolytes and repeat in a.m. - Resume Demadex  in a.m. - UA with large leukocytes and rare bacteria, he denies any urinary symptoms whatsoever.  Afebrile without leukocytosis.  Urine culture was ordered in ED.  Chronic ystolic and diastolic CHF-stable and compensated.  Recent echo 11/26/2024-EF of 30 to 35%. - Resume Demadex  in a.m.  CKD 4-stable.  Recent NSTEMI-secondary to demand ischemia in the setting of CKD 4.   - Was discharged continue aspirin , statin, Plavix , to follow-up with cardiology  Hypertension-stable. - Resume carvedilol  Imdur, torsemide   DVT prophylaxis: Heparin  Code Status: Full code Family Communication: None at bedside Disposition Plan: ~ 2 days Consults called: None Admission status:  Obs tele     Author: Tully FORBES Carwin, MD 12/02/2024 8:04 PM  For on call review [email or web address].     [1]  Allergies Allergen Reactions   Ibuprofen  Other (See Comments)    Avoid per  PCP   Oxcarbazepine Other (See Comments)    Patient goes out of right state of mind.    "

## 2024-12-02 NOTE — ED Notes (Signed)
 Date and time results received: 12/02/2024 1533   Test: Ca+ Critical Value: 6.1  Name of Provider Notified: Tinnie, PA

## 2024-12-02 NOTE — ED Triage Notes (Signed)
 Pt comes in after 1 episode of emesis. Episode happened after eating and pt laid down. Pt does have some dizziness. All s/s has resolved. Denies any pain. Pt wonders if he ate too much. A&Ox4,   Left side deficient due to previous stroke.

## 2024-12-03 ENCOUNTER — Other Ambulatory Visit: Payer: Self-pay

## 2024-12-03 ENCOUNTER — Encounter (HOSPITAL_COMMUNITY): Payer: Self-pay | Admitting: Internal Medicine

## 2024-12-03 LAB — GLUCOSE, CAPILLARY
Glucose-Capillary: 106 mg/dL — ABNORMAL HIGH (ref 70–99)
Glucose-Capillary: 140 mg/dL — ABNORMAL HIGH (ref 70–99)

## 2024-12-03 LAB — BASIC METABOLIC PANEL WITH GFR
Anion gap: 8 (ref 5–15)
BUN: 51 mg/dL — ABNORMAL HIGH (ref 6–20)
CO2: 27 mmol/L (ref 22–32)
Calcium: 8.6 mg/dL — ABNORMAL LOW (ref 8.9–10.3)
Chloride: 106 mmol/L (ref 98–111)
Creatinine, Ser: 4.67 mg/dL — ABNORMAL HIGH (ref 0.61–1.24)
GFR, Estimated: 14 mL/min — ABNORMAL LOW
Glucose, Bld: 93 mg/dL (ref 70–99)
Potassium: 4.3 mmol/L (ref 3.5–5.1)
Sodium: 141 mmol/L (ref 135–145)

## 2024-12-03 LAB — MAGNESIUM: Magnesium: 2.7 mg/dL — ABNORMAL HIGH (ref 1.7–2.4)

## 2024-12-03 MED ORDER — CEPHALEXIN 250 MG PO CAPS: 250.0000 mg | ORAL_CAPSULE | Freq: Two times a day (BID) | ORAL | 0 refills | Status: AC

## 2024-12-03 MED ORDER — POTASSIUM CHLORIDE CRYS ER 10 MEQ PO TBCR: 10.0000 meq | EXTENDED_RELEASE_TABLET | Freq: Every day | ORAL | 2 refills | Status: DC

## 2024-12-03 MED ORDER — PANTOPRAZOLE SODIUM 40 MG PO TBEC
40.0000 mg | DELAYED_RELEASE_TABLET | Freq: Every day | ORAL | 4 refills | Status: AC
  Filled 2025-01-03: qty 30, 30d supply, fill #0

## 2024-12-03 MED ORDER — ATORVASTATIN CALCIUM 80 MG PO TABS
80.0000 mg | ORAL_TABLET | Freq: Every day | ORAL | 11 refills | Status: DC
  Filled 2025-01-03: qty 30, 30d supply, fill #0

## 2024-12-03 MED ORDER — LOPERAMIDE HCL 2 MG PO CAPS
2.0000 mg | ORAL_CAPSULE | ORAL | 3 refills | Status: AC | PRN
  Filled 2025-01-03: qty 30, 7d supply, fill #0

## 2024-12-03 MED ORDER — ASPIRIN 81 MG PO TBEC
81.0000 mg | DELAYED_RELEASE_TABLET | Freq: Every day | ORAL | 2 refills | Status: AC
  Filled 2025-01-03: qty 30, 30d supply, fill #0

## 2024-12-03 MED ORDER — TORSEMIDE 20 MG PO TABS: 20.0000 mg | ORAL_TABLET | Freq: Two times a day (BID) | ORAL | 2 refills | Status: DC

## 2024-12-03 MED ORDER — ACETAMINOPHEN 325 MG PO TABS: 650.0000 mg | ORAL_TABLET | Freq: Four times a day (QID) | ORAL | Status: AC | PRN

## 2024-12-03 MED ORDER — CARVEDILOL 25 MG PO TABS: 25.0000 mg | ORAL_TABLET | Freq: Two times a day (BID) | ORAL | 11 refills | Status: DC

## 2024-12-03 MED ORDER — ISOSORB DINITRATE-HYDRALAZINE 20-37.5 MG PO TABS: 2.0000 | ORAL_TABLET | Freq: Three times a day (TID) | ORAL | 3 refills | Status: DC

## 2024-12-03 MED ORDER — ESCITALOPRAM OXALATE 20 MG PO TABS
20.0000 mg | ORAL_TABLET | Freq: Every day | ORAL | 11 refills | Status: DC
  Filled 2025-01-03: qty 30, 30d supply, fill #0

## 2024-12-03 NOTE — Progress Notes (Signed)
 Patient admitted with hypocalcemia, discharged home 4 days ago. No new needs.      12/03/24 1403  TOC Brief Assessment  Insurance and Status Reviewed  Patient has primary care physician Yes  Home environment has been reviewed Home with Son  Prior level of function: Needs assistance  Prior/Current Home Services Current home services (RN)  Social Drivers of Health Review SDOH reviewed no interventions necessary  Readmission risk has been reviewed Yes  Transition of care needs no transition of care needs at this time

## 2024-12-03 NOTE — Plan of Care (Signed)

## 2024-12-03 NOTE — Discharge Summary (Signed)
 "                                                                                 Taylor Obrien, is a 58 y.o. male  DOB [birth year]  MRN [id number].  Admission date:  12/02/2024  Admitting Physician  Tully FORBES Carwin, MD  Discharge Date:  12/03/2024   Primary MD  Trudy Ave, NP  Recommendations for primary care physician for things to follow:  1) okay to use Imodium /loperamide --as needed for diarrhea or loose stools 2) please note that there has been some changes to your medications--your torsemide /Demadex  diuretic dose was adjusted 3) follow-up to primary care physician in 3 to 5 days for repeat BMP blood test 4)Avoid ibuprofen /Advil /Aleve /Motrin Taylor Obrien /[name]/Meloxicam/Diclofenac/Indomethacin and other Nonsteroidal anti-inflammatory medications as these will make you more likely to bleed and can cause stomach ulcers, can also cause Kidney problems. 5)Very Low-salt diet advised---Less than 2 gm of Sodium per day advised----ok to use Mrs DASH salt substitute instead of Salt 6)Weigh yourself daily, call if you gain more than 3 pounds in 1 day or more than 5 pounds in 1 week as your diuretic medications may need to be adjusted  Admission Diagnosis  Hypocalcemia [E83.51] Hypomagnesemia [E83.42] Nausea and vomiting, unspecified vomiting type [R11.2]   Discharge Diagnosis  Hypocalcemia [E83.51] Hypomagnesemia [E83.42] Nausea and vomiting, unspecified vomiting type [R11.2]    Principal Problem:   Hypocalcemia Active Problems:   Diabetes mellitus (HCC)   Essential hypertension   CKD (chronic kidney disease) stage 4, GFR 15-29 ml/min (HCC)   NSTEMI (non-ST elevated myocardial infarction) (HCC)      Past Medical History:  Diagnosis Date   Acute alcoholic pancreatitis    Arthritis    Chronic pain    Depression    Diabetes mellitus without complication (HCC)    Essential hypertension, benign    GERD (gastroesophageal reflux disease)    Headache    HTN  (hypertension) 11/27/2015   Hyperlipidemia    Peripheral vascular disease    Sleep apnea    could not tolerate CPAP   Stroke (HCC)    2016    Past Surgical History:  Procedure Laterality Date   BIOPSY  08/19/2022   Procedure: BIOPSY;  Surgeon: Cindie Carlin POUR, DO;  Location: [facility];  Service: Endoscopy;;   CLOSED REDUCTION MANDIBULAR FRACTURE W/ ARCH BARS     + multiple extractions   COLONOSCOPY WITH PROPOFOL  N/A 08/19/2022   Procedure: COLONOSCOPY WITH PROPOFOL ;  Surgeon: Cindie Carlin POUR, DO;  Location: [facility];  Service: Endoscopy;  Laterality: N/A;  9:15am   Condyloma resection     ESOPHAGOGASTRODUODENOSCOPY (EGD) WITH PROPOFOL  N/A 08/19/2022   Procedure: ESOPHAGOGASTRODUODENOSCOPY (EGD) WITH PROPOFOL ;  Surgeon: Cindie Carlin POUR, DO;  Location: [facility];  Service: Endoscopy;  Laterality: N/A;   MULTIPLE EXTRACTIONS WITH ALVEOLOPLASTY Bilateral 01/23/2018   Procedure: DENTAL EXTRACTION OF TEETH NUMBER ONE, TWO, THREE, FOUR, FIVE, SIX, SEVEN, EIGHT, NINE, TEN, ELEVEN, TWELVE, THIRTEEN, FOURTEEN, FIFTEEN, SIXTEEN, SEVENTEEN, TWENTY, TWENTY-ONE, TWENTY-TWO, TWENTY-THREE, TWENTY-FOUR, TWENTY-FIVE, TWENTY-SIX, TWENTY-SEVEN, TWENTY-EIGHT, TWENTY-NINE, THIRTY-TWO WITH ALVEOLOPLASTY;  Surgeon: Sheryle Hamilton, DDS;  Location: [facility] OR;  Service: Oral Surgery;  Lat   POLYPECTOMY  08/19/2022   Procedure: POLYPECTOMY;  Surgeon: Cindie Carlin POUR, DO;  Location: [facility];  Service: Endoscopy;;   RADIOLOGY WITH ANESTHESIA N/A 11/18/2015   Procedure: RADIOLOGY WITH ANESTHESIA;  Surgeon: Thyra Nash, MD;  Location: [facility] OR;  Service: Radiology;  Laterality: N/A;   Removal foreign body right shoulder     Right rotator cuff repair     TEE WITHOUT CARDIOVERSION N/A 11/21/2015   Procedure: TRANSESOPHAGEAL ECHOCARDIOGRAM (TEE);  Surgeon: Ezra GORMAN Shuck, MD;  Location: [facility] ENDOSCOPY;  Service: Cardiovascular;  Laterality: N/A;   TOOTH EXTRACTION N/A 01/24/2018   Procedure: SUTURE  ORAL WOUND;  Surgeon: Sheryle Hamilton, DDS;  Location: [facility] OR;  Service: Oral Surgery;  Laterality: N/A;       HPI  from the history and physical done on the day of admission:   Chief Complaint: Vomiting   HPI: Taylor Obrien is a 58 y.o. male with medical history significant for hypertension, alcoholic pancreatitis, systolic and diastolic CHF, CKD 3B/4, stroke, diabetes mellitus, OSA. Patient presented to the ED with complaints of dizziness, and vomiting 1 hour after he had a Christmas dinner.  He reports a large amount of vomitus.  Abdominal pain.  No diarrhea.  No fevers no chills.  He denies alcohol abuse.  He denies pain with urination or frequency, reports at baseline it takes a long time to void.   Recent hospitalization 12/18 to 12/22 for hypoxia, CHF exacerbation, hypertensive emergency, NSTEMI with troponins elevated to 1368.  Likely demand ischemia in the setting of CKD.  Completed 48 hours of heparin .  Was discharged to continue aspirin  Plavix  and statin and follow-up with cardiology.   ED Course: Stable vitals.  Calcium  6.1, corrected for albumin was 7.2.  Potassium 3.3.  Magnesium  1.4. Electrolyte supplementation started.  Hospitalist to admit for hypocalcemia hypomagnesemia and hypokalemia.   Review of Systems: As per HPI all other systems reviewed and negative.   Hospital Course:    Assessment and Plan: Hypocalcemia, hypomagnesemia hypokalemia-calcium  6.1, corrected for albumin 7.2, potassium 3.3, magnesium  1.4.  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  Recent echo  11/26/2024-EF of 30 to 35%, prior echo from 2024 with diastolic dysfunction - -Not volume overloaded -Torsemide  adjusted downward due to concerns about significant electrolyte abnormalities in the setting of high-dose torsemide  use -Given poor renal function patient may actually do better with torsemide  40 mg daily rather than 20 mg twice daily---PCP can readdress this -Low-salt and fluid restriction as advised-please see discharge instructions -Continue BiDil , Coreg , unable to use ACEI/ARB/ARNI due to renal concerns   CKD 4-stable --Given poor renal function patient may actually do better with torsemide  40 mg daily rather than 20 mg twice daily---PCP can readdress this as outpatient   Recent NSTEMI-secondary to demand ischemia in the setting of CKD 4.   - Per family and patient currently not taking Plavix  -Okay to continue aspirin  and Coreg  as well as atorvastatin    Hypertension-stable. - Resume carvedilol , BiDil  and torsemide   Chronic anemia of CKD--Hgb stable, no bleeding concerns  Disposition--patient gets 8 hours of aide services at home every day - Resume/[facility] Aide services upon discharge -  Patient lives with family members at home who also provide additional support  Discharge Condition: Stable,  Follow UP   Follow-up Information     Trudy Ave, NP. Schedule an appointment as soon as possible for a visit in 3 day(s).   Specialty: Nurse Practitioner Why: Repeat BMP Blood Test in 3 to 5 days Contact information: [address] [place] [address] [phone]                 Diet and Activity recommendation:  As advised  Discharge Instructions    Discharge Instructions     Call MD for:  difficulty breathing, headache or visual disturbances   Complete by: As directed    Call MD for:  persistant dizziness or light-headedness   Complete by: As directed    Call MD for:  persistant nausea and vomiting   Complete by: As directed    Call MD for:   temperature >100.4   Complete by: As directed    Diet - low sodium heart healthy   Complete by: As directed    Discharge instructions   Complete by: As directed    1) okay to use Imodium /loperamide --as needed for diarrhea or loose stools 2) please note that there has been some changes to your medications--your torsemide /Demadex  diuretic dose was adjusted 3) follow-up to primary care physician in 3 to 5 days for repeat BMP blood test 4)Avoid ibuprofen /Advil /Aleve /Motrin Taylor Obrien /[name]/Meloxicam/Diclofenac/Indomethacin and other Nonsteroidal anti-inflammatory medications as these will make you more likely to bleed and can cause stomach ulcers, can also cause Kidney problems. 5)Very Low-salt diet advised---Less than 2 gm of Sodium per day advised----ok to use Mrs DASH salt substitute instead of Salt 6)Weigh yourself daily, call if you gain more than 3 pounds in 1 day or more than 5 pounds in 1 week as your diuretic medications may need to be adjusted   Increase activity slowly   Complete by: As directed        Discharge Medications     Allergies as of 12/03/2024       Reactions   Ibuprofen  Other (See Comments)   Avoid per PCP   Oxcarbazepine Other (See Comments)   Patient goes out of right state of mind.         Medication List     STOP taking these medications    aspirin  81 MG chewable tablet Replaced by: aspirin  EC 81 MG tablet   clopidogrel  75 MG tablet Commonly known as: PLAVIX    melatonin 5 MG Tabs   nicotine  21 mg/24hr patch Commonly known as: NICODERM CQ  - dosed in mg/24 hours       TAKE these medications    acetaminophen  325 MG tablet Commonly known as: TYLENOL  Take 2 tablets (650 mg total) by mouth every 6 (six) hours as needed for mild pain (pain score 1-3) or fever (or Fever >/= 101). What changed:  medication strength how much to take when to take this reasons to take this   aspirin  EC 81 MG tablet Take 1 tablet (81 mg total)  by mouth daily with breakfast. Replaces: aspirin  81 MG chewable tablet   atorvastatin  80 MG tablet Commonly known as: LIPITOR  Take 1 tablet (80 mg total) by mouth daily.   calcitRIOL  0.25 MCG capsule Commonly known as: ROCALTROL  Take 1 capsule (0.25 mcg total) by mouth every Monday, Wednesday, and Friday.   carvedilol  25 MG tablet Commonly known as: COREG  Take 1 tablet (25 mg total) by mouth 2 (two) times  daily with a meal.   cephALEXin  250 MG capsule Commonly known as: KEFLEX  Take 1 capsule (250 mg total) by mouth 2 (two) times daily for 3 days.   escitalopram  20 MG tablet Commonly known as: LEXAPRO  Take 1 tablet (20 mg total) by mouth daily.   fenofibrate  145 MG tablet Commonly known as: TRICOR  Take 145 mg by mouth daily.   isosorbide -hydrALAZINE  20-37.5 MG tablet Commonly known as: BIDIL  Take 2 tablets by mouth 3 (three) times daily.   lidocaine  5 % Commonly known as: LIDODERM  Place 2 patches onto the skin daily. Remove & Discard patch within 12 hours or as directed by MD What changed:  when to take this reasons to take this   loperamide  2 MG capsule Commonly known as: IMODIUM  Take 1 capsule (2 mg total) by mouth as needed for diarrhea or loose stools (Loose stools/Diarrhea).   methocarbamol  500 MG tablet Commonly known as: ROBAXIN  Take 500 mg by mouth every 8 (eight) hours as needed.   pantoprazole  40 MG tablet Commonly known as: PROTONIX  Take 1 tablet (40 mg total) by mouth daily. What changed: when to take this   potassium chloride  10 MEQ tablet Commonly known as: KLOR-CON  M Take 1 tablet (10 mEq total) by mouth daily. Take While taking Torsemide /Demadex    torsemide  20 MG tablet Commonly known as: DEMADEX  Take 1 tablet (20 mg total) by mouth 2 (two) times daily. What changed: how much to take   Vascepa  1 g capsule Generic drug: icosapent  Ethyl Take 2 g by mouth 2 (two) times daily.   Vitamin D  (Ergocalciferol ) 1.25 MG (50000 UNIT) Caps  capsule Commonly known as: DRISDOL  Take 1 capsule (50,000 Units total) by mouth every 7 (seven) days.       Major procedures and Radiology Reports - PLEASE review detailed and final reports for all details, in brief -   ECHOCARDIOGRAM LIMITED Result Date: 11/26/2024    ECHOCARDIOGRAM LIMITED REPORT   Patient Name:   Taylor Obrien Date of Exam: 11/26/2024 Medical Rec #:  [id number]     Height:       66.0 in Accession #:    [id number]    Weight:       164.0 lb Date of Birth:  August 28, 1966     BSA:          1.838 m Patient Age:    58 years      BP:           163/110 mmHg Patient Gender: M             HR:           85 bpm. Exam Location:  Inpatient Procedure: Limited Echo, Cardiac Doppler, Color Doppler and Intracardiac            Opacification Agent (Both Spectral and Color Flow Doppler were            utilized during procedure). Indications:    122-I22.9 Subsequent ST elevation (STEM) and non-ST elevation                 (NSTEMI) myocardial infarction  History:        Patient has prior history of Echocardiogram examinations, most                 recent 10/31/2024. Stroke, Signs/Symptoms:Shortness of Breath,                 Dyspnea, Chest Pain and Altered Mental Status; Risk  Factors:Current Smoker, Sleep Apnea, Diabetes, Dyslipidemia and                 Hypertension. ETOH.  Sonographer:    Ellouise Mose RDCS Referring Phys: [id number] CAROLE N HALL  Sonographer Comments: Technically difficult study due to poor echo windows. IMPRESSIONS  1. Limited study.  2. Left ventricular ejection fraction, by estimation, is 30 to 35%. Left ventricular ejection fraction by 2D MOD biplane is 31.4 %. The left ventricle has moderately decreased function. The left ventricle demonstrates regional wall motion abnormalities (see scoring diagram/findings for description). There is moderate asymmetric left ventricular hypertrophy of the septal segment. Left ventricular diastolic function could not be evaluated. The average  left ventricular global longitudinal strain is -12.1 %. The global longitudinal strain is abnormal.  3. Right ventricular systolic function is normal. The right ventricular size is normal. Tricuspid regurgitation signal is inadequate for assessing PA pressure.  4. The mitral valve is grossly normal. Mild mitral valve regurgitation.  5. The aortic valve was not well visualized.  6. The inferior vena cava is normal in size with greater than 50% respiratory variability, suggesting right atrial pressure of 3 mmHg. Comparison(s): Prior images reviewed side by side. LVEF 30-35% range at this point, similar wall motion abnormalties were present on the prior study as well. Mild mitral regurgitation. FINDINGS  Left Ventricle: Left ventricular ejection fraction, by estimation, is 30 to 35%. Left ventricular ejection fraction by 2D MOD biplane is 31.4 %. The left ventricle has moderately decreased function. The left ventricle demonstrates regional wall motion abnormalities. Definity  contrast agent was given IV to delineate the left ventricular endocardial borders. The average left ventricular global longitudinal strain is -12.1 %. Strain was performed and the global longitudinal strain is abnormal. The left ventricular internal cavity size was normal in size. There is moderate asymmetric left ventricular hypertrophy of the septal segment. Left ventricular diastolic function could not be evaluated.  LV Wall Scoring: The basal inferolateral segment, basal anterolateral segment, and basal inferior segment are akinetic. The entire anterior wall, mid and distal lateral wall, entire septum, entire apex, mid and distal inferior wall, and mid anterolateral segment are hypokinetic. Right Ventricle: The right ventricular size is normal. No increase in right ventricular wall thickness. Right ventricular systolic function is normal. Tricuspid regurgitation signal is inadequate for assessing PA pressure. Pericardium: There is no evidence  of pericardial effusion. Mitral Valve: The mitral valve is grossly normal. Mild mitral valve regurgitation. Tricuspid Valve: The tricuspid valve is grossly normal. Tricuspid valve regurgitation is trivial. No evidence of tricuspid stenosis. Aortic Valve: The aortic valve was not well visualized. Aorta: The aortic root and ascending aorta are structurally normal, with no evidence of dilitation. Venous: The inferior vena cava is normal in size with greater than 50% respiratory variability, suggesting right atrial pressure of 3 mmHg. Additional Comments: Spectral Doppler performed. Color Doppler performed.  LEFT VENTRICLE PLAX 2D                        Biplane EF (MOD) LVIDd:         4.80 cm         LV Biplane EF:   Left LVIDs:         4.30 cm                          ventricular LV PW:         1.40 cm  ejection LV IVS:        1.70 cm                          fraction by                                                 2D MOD                                                 biplane is LV Volumes (MOD)                                31.4 %. LV vol d, MOD    119.0 ml A2C: LV vol d, MOD    95.7 ml       2D Longitudinal A4C:                           Strain LV vol s, MOD    77.0 ml       2D Strain GLS   -12.1 % A2C:                           Avg: LV vol s, MOD    66.2 ml A4C: LV SV MOD A2C:   42.0 ml LV SV MOD A4C:   95.7 ml LV SV MOD BP:    34.2 ml IVC IVC diam: 0.70 cm AORTIC VALVE LVOT Vmax:   106.00 cm/s LVOT Vmean:  64.500 cm/s LVOT VTI:    0.148 m  AORTA Ao Asc diam: 2.90 cm MITRAL VALVE MV Area (PHT): 4.49 cm     SHUNTS MV Decel Time: 169 msec     Systemic VTI: 0.15 m MV E velocity: 49.30 cm/s MV A velocity: 103.00 cm/s MV E/A ratio:  0.48 Taylor Sierras MD Electronically signed by Taylor Sierras MD Signature Date/Time: 11/26/2024/1:45:30 PM    Final    DG Chest Port 1 View Result Date: 11/25/2024 EXAM: 1 VIEW(S) XRAY OF THE CHEST 11/25/2024 07:32:09 PM COMPARISON: 11/15/2024 CLINICAL  HISTORY: SOB FINDINGS: LUNGS AND PLEURA: Increased central vascular congestion with diffuse parenchymal opacity bilaterally consistent with edema. Small left pleural effusion is noted. No pneumothorax. HEART AND MEDIASTINUM: Cardiomegaly. Increased central vascular congestion. BONES AND SOFT TISSUES: No acute osseous abnormality. IMPRESSION: 1. Worsening congestive heart failure 2. Small left pleural effusion. Electronically signed by: Oneil Devonshire MD 11/25/2024 07:34 PM EST RP Workstation: [id number]   CT CERVICAL SPINE WO CONTRAST Result Date: 11/15/2024 EXAM: CT CERVICAL SPINE WITHOUT CONTRAST 11/15/2024 07:22:07 PM TECHNIQUE: CT of the cervical spine was performed without the administration of intravenous contrast. Multiplanar reformatted images are provided for review. Automated exposure control, iterative reconstruction, and/or weight based adjustment of the mA/kV was utilized to reduce the radiation dose to as low as reasonably achievable. COMPARISON: CT spine 07/06/2018. CLINICAL HISTORY: Polytrauma, blunt. 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 No acute fracture or traumatic malalignment. DEGENERATIVE CHANGES: Multilevel mild-to-moderate degenerative changes of  the spine. No associated severe osseous neural foraminal or central canal stenosis. SOFT TISSUES: No prevertebral soft tissue swelling. IMPRESSION: 1. No acute abnormality of the cervical spine. 2. 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 Electronically signed by: Morgane Naveau MD 11/15/2024 07:48 PM EST RP Workstation: [id number]   CT T-SPINE NO CHARGE Result Date: 11/15/2024 EXAM: CT THORACIC SPINE WITHOUT CONTRAST 11/15/2024 07:22:07 PM TECHNIQUE: CT of the thoracic spine was performed without the administration of intravenous contrast. Multiplanar reformatted images are provided for review. Automated exposure  control, iterative reconstruction, and/or weight based adjustment of the mA/kV was utilized to reduce the radiation dose to as low as reasonably achievable. COMPARISON: MRI lumbar spine 06/27/2024. CLINICAL HISTORY: FINDINGS: BONES AND ALIGNMENT: Normal vertebral body heights except for T12. Chronic stable T12 supra endplate mild compression fracture with associated Schmorl's node. No acute fracture or suspicious bone lesion. Normal alignment. DEGENERATIVE CHANGES: Multilevel mild-to-moderate anterior osteophyte formation. SOFT TISSUES: No acute abnormality. IMPRESSION: 1. No acute findings. 2. Chronic stable T12 superior endplate mild compression fracture. 3. Please see separately dictated CT , abdomen, pelvis, lumbar spine 11/15/2024. Electronically signed by: Morgane Naveau MD 11/15/2024 07:36 PM EST RP Workstation: [id number]   CT L-SPINE NO CHARGE Result Date: 11/15/2024 EXAM: CT OF THE LUMBAR SPINE WITHOUT CONTRAST 11/15/2024 07:22:07 PM TECHNIQUE: CT of the lumbar spine was performed without the administration of intravenous contrast. Multiplanar reformatted images are provided for review. Automated exposure control, iterative reconstruction, and/or weight based adjustment of the mA/kV was utilized to reduce the radiation dose to as low as reasonably achievable. COMPARISON: MRI lumbar spine 06/27/2024. CLINICAL HISTORY: Please see separately dictated CT thoracic spine 11/15/2024. FINDINGS: BONES AND ALIGNMENT: Normal vertebral body heights. No acute fracture or suspicious bone lesion. Normal alignment. DEGENERATIVE CHANGES: Mild anterior osteophyte formation. SOFT TISSUES: No acute abnormality. IMPRESSION: 1. No acute findings. 2. Please see separately dictated CT chest, abdomen, pelvis, thoracic spine 11/15/2024. Electronically signed by: Morgane Naveau MD 11/15/2024 07:35 PM EST RP Workstation: [id number]   CT CHEST ABDOMEN PELVIS WO CONTRAST Result Date: 11/15/2024 EXAM: CT CHEST, ABDOMEN AND PELVIS  WITHOUT CONTRAST 11/15/2024 07:22:07 PM TECHNIQUE: CT of the chest, abdomen and pelvis was performed without the administration of intravenous contrast. Multiplanar reformatted images are provided for review. Automated exposure control, iterative reconstruction, and/or weight based adjustment of the mA/kV was utilized to reduce the radiation dose to as low as reasonably achievable. COMPARISON: CT angio neurobiform 06/27/2024, CT abdomen and pelvis 04/29/2024. Please see separately dictated CT thoracolumbar spine 11/15/2024. CLINICAL HISTORY: Polytrauma, blunt. FINDINGS: CHEST: MEDIASTINUM AND LYMPH NODES: Heart and pericardium are unremarkable. The central airways are clear. No mediastinal, hilar or axillary lymphadenopathy.No gross hilar adenopathy with limited evaluation on this noncontrast study. 4-vessel coronary artery calcifications. LUNGS AND PLEURA: No focal consolidation or pulmonary edema. No pleural effusion or pneumothorax. ABDOMEN AND PELVIS: LIVER: The liver is unremarkable. GALLBLADDER AND BILE DUCTS: Gallbladder is unremarkable. No biliary ductal dilatation. SPLEEN: No acute abnormality. PANCREAS: No acute abnormality. ADRENAL GLANDS: No acute abnormality. KIDNEYS, URETERS AND BLADDER: No stones in the kidneys or ureters. No hydronephrosis. No perinephric or periureteral stranding. Urinary bladder is unremarkable. Calcifications associated with the kidneys are likely vascular. GI AND BOWEL: Stomach demonstrates no acute abnormality. There is no bowel obstruction. No small or large bowel thickening or dilatation. The appendix is unremarkable. Colonic diverticulosis. Otherwise, the colon is unremarkable. REPRODUCTIVE ORGANS: No acute abnormality. PERITONEUM AND RETROPERITONEUM: No ascites. No free air. VASCULATURE: Aorta is normal  in caliber. Severe atherosclerotic plaque of the abdominal aorta. Mild atherosclerotic plaque in the thoracic aorta. ABDOMINAL AND PELVIS LYMPH NODES: No lymphadenopathy.  BONES AND SOFT TISSUES: No acute displaced rib fracture. No acute displaced fracture or dislocation of either shoulder or hips. No acute displaced fracture or diastasis of the bones of the pelvis. No acute sacral fracture. No large soft tissue hematoma. Please see separately dictated CT thoracolumbar spine 12.8.25 . No large soft tissue hematoma No focal soft tissue abnormality. IMPRESSION: 1. No acute findings related to trauma . 2. Please see separately dictated CT thoracolumbar spine 12.8.25 . 3. Other, non-acute and/or normal findings as above. Electronically signed by: Morgane Naveau MD 11/15/2024 07:34 PM EST RP Workstation: [id number]   CT HEAD WO CONTRAST Result Date: 11/15/2024 EXAM: CT HEAD WITHOUT CONTRAST 11/15/2024 07:22:07 PM TECHNIQUE: CT of the head was performed without the administration of intravenous contrast. Automated exposure control, iterative reconstruction, and/or weight based adjustment of the mA/kV was utilized to reduce the radiation dose to as low as reasonably achievable. COMPARISON: None available. CLINICAL HISTORY: Head trauma, moderate-severe FINDINGS: BRAIN AND VENTRICLES: No acute hemorrhage. No evidence of acute infarct. 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 Chronic stable lacunar infarcts in left basal ganglia and pons. Patchy and confluent supratentorial white matter hypodensities, likely representing advanced chronic microvascular ischemic changes. No hydrocephalus. No extra-axial collection. No mass effect or midline shift. Atherosclerotic calcifications are present within the cavernous internal carotid arteries. ORBITS: No acute abnormality. SINUSES: No acute abnormality. SOFT TISSUES AND SKULL: No acute soft tissue abnormality. No skull fracture. IMPRESSION: 1. No acute intracranial abnormality related to head trauma. Electronically signed by: Morgane Naveau MD 11/15/2024 07:29 PM EST RP Workstation: [id number]   DG Pelvis Portable Result  Date: 11/15/2024 CLINICAL DATA:  Clemens out of wheelchair, lower back pain EXAM: PORTABLE PELVIS 1-2 VIEWS COMPARISON:  None Available. FINDINGS: Frontal view of the pelvis includes both hips. No acute displaced fracture, subluxation, or dislocation. Mild symmetrical bilateral hip osteoarthritis. Soft tissues are unremarkable. IMPRESSION: 1. Symmetrical bilateral hip osteoarthritis. 2. No acute fracture. Electronically Signed   By: Ozell Daring M.D.   On: 11/15/2024 18:10   DG Chest Port 1 View Result Date: 11/15/2024 CLINICAL DATA:  Clemens out of wheelchair, lower back pain EXAM: PORTABLE CHEST 1 VIEW COMPARISON:  10/30/2024 FINDINGS: The heart size and mediastinal contours are within normal limits. Both lungs are clear. The visualized skeletal structures are unremarkable. IMPRESSION: No active disease. Electronically Signed   By: Ozell Daring M.D.   On: 11/15/2024 18:09   Micro Results   Recent Results (from the past 240 hours)  Resp panel by RT-PCR (RSV, Flu A&B, Covid) Anterior Nasal Swab     Status: None   Collection Time: 11/25/24  7:16 PM   Specimen: Anterior Nasal Swab  Result Value Ref Range Status   SARS Coronavirus 2 by RT PCR NEGATIVE NEGATIVE Final    Comment: (NOTE) SARS-CoV-2 target nucleic acids are NOT DETECTED.  The SARS-CoV-2 RNA is generally detectable in upper respiratory specimens during the acute phase of infection. The lowest concentration of SARS-CoV-2 viral copies this assay can detect is 138 copies/mL. A negative result does not preclude SARS-Cov-2 infection and should not be used as the sole basis for treatment or other patient management decisions. A negative result may occur with  improper specimen collection/handling, submission of specimen other than nasopharyngeal swab, presence of viral mutation(s) within the areas targeted by this assay, and inadequate  number of viral copies(<138 copies/mL). A negative result must be combined with clinical observations,  patient history, and epidemiological information. The expected result is Negative.  Fact Sheet for Patients:  bloggercourse.com  Fact Sheet for Healthcare Providers:  seriousbroker.it  This test is no t yet approved or cleared by the [place]  FDA and  has been authorized for detection and/or diagnosis of SARS-CoV-2 by FDA under an Emergency Use Authorization (EUA). This EUA will remain  in effect (meaning this test can be used) for the duration of the COVID-19 declaration under Section 564(b)(1) of the Act, 21 U.S.C.section 360bbb-3(b)(1), unless the authorization is terminated  or revoked sooner.       Influenza A by PCR NEGATIVE NEGATIVE Final   Influenza B by PCR NEGATIVE NEGATIVE Final    Comment: (NOTE) The Xpert Xpress SARS-CoV-2/FLU/RSV plus assay is intended as an aid in the diagnosis of influenza from Nasopharyngeal swab specimens and should not be used as a sole basis for treatment. Nasal washings and aspirates are unacceptable for Xpert Xpress SARS-CoV-2/FLU/RSV testing.  Fact Sheet for Patients: bloggercourse.com  Fact Sheet for Healthcare Providers: seriousbroker.it  This test is not yet approved or cleared by the [place]  FDA and has been authorized for detection and/or diagnosis of SARS-CoV-2 by FDA under an Emergency Use Authorization (EUA). This EUA will remain in effect (meaning this test can be used) for the duration of the COVID-19 declaration under Section 564(b)(1) of the Act, 21 U.S.C. section 360bbb-3(b)(1), unless the authorization is terminated or revoked.     Resp Syncytial Virus by PCR NEGATIVE NEGATIVE Final    Comment: (NOTE) Fact Sheet for Patients: bloggercourse.com  Fact Sheet for Healthcare Providers: seriousbroker.it  This test is not yet approved or cleared by the [place]  [place] FDA and has been authorized for detection and/or diagnosis of SARS-CoV-2 by FDA under an Emergency Use Authorization (EUA). This EUA will remain in effect (meaning this test can be used) for the duration of the COVID-19 declaration under Section 564(b)(1) of the Act, 21 U.S.C. section 360bbb-3(b)(1), unless the authorization is terminated or revoked.  Performed at [facility], [address] [place], [place] [address]   MRSA Next Gen by PCR, Nasal     Status: None   Collection Time: 11/26/24 12:57 AM   Specimen: Nasal Mucosa; Nasal Swab  Result Value Ref Range Status   MRSA by PCR Next Gen NOT DETECTED NOT DETECTED Final    Comment: (NOTE) The GeneXpert MRSA Assay (FDA approved for NASAL specimens only), is one component of a comprehensive MRSA colonization surveillance program. It is not intended to diagnose MRSA infection nor to guide or monitor treatment for MRSA infections. Test performance is not FDA approved in patients less than 42 years old. Performed at [facility], [address], [place], [place] [address]    Today   Subjective    Taylor Obrien today has no new complaints - No further emesis - No further diarrhea - Tolerating oral intake well      - [name] daughter ms Jadene Laura at bedside, questions answered   Patient has been seen and examined prior to discharge   Objective   Blood pressure (!) 150/92, pulse 97, temperature 98.7 F (37.1 C), temperature source Oral, resp. rate 17, height 5' 6 (1.676 m), weight 72.6 kg, SpO2 97%.   Intake/Output Summary (Last 24 hours) at 12/03/2024 1500 Last data filed at 12/03/2024 0715 Gross per 24 hour  Intake --  Output 100 ml  Net -100 ml   Exam Gen:- Awake Alert, no acute distress  HEENT:- [place].AT, No sclera icterus Neck-Supple Neck,No JVD,.  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 , no new focal deficits, no tremors    Data Review   CBC w Diff:  Lab Results  Component Value Date   WBC 9.0 12/02/2024   HGB 9.7 (L) 12/02/2024   HCT 29.8 (L) 12/02/2024   PLT 242 12/02/2024   LYMPHOPCT 11 12/02/2024   MONOPCT 6 12/02/2024   EOSPCT 1 12/02/2024   BASOPCT 0 12/02/2024   CMP:  Lab Results  Component Value Date   NA 141 12/03/2024   NA 140 11/25/2024   K 4.3 12/03/2024   CL 106 12/03/2024   CO2 27 12/03/2024   BUN 51 (H) 12/03/2024   BUN 37 (H) 11/25/2024   CREATININE 4.67 (H) 12/03/2024   PROT 4.5 (L) 12/02/2024   ALBUMIN 2.6 (L) 12/02/2024   BILITOT <0.2 12/02/2024   ALKPHOS 34 (L) 12/02/2024   AST 15 12/02/2024   ALT 7 12/02/2024  . Total Discharge time is about 33 minutes  Rendall Carwin M.D on 12/03/2024 at 3:00 PM  Go to [email or web address] -  for contact info  [name] - Office  [phone]   "

## 2024-12-03 NOTE — Discharge Instructions (Signed)
 1) okay to use Imodium /loperamide --as needed for diarrhea or loose stools 2) please note that there has been some changes to your medications--your torsemide /Demadex  diuretic dose was adjusted 3) follow-up to primary care physician in 3 to 5 days for repeat BMP blood test 4)Avoid ibuprofen /Advil /Aleve /Motrin Josefine Powders/Naproxen /BC powders/Meloxicam/Diclofenac/Indomethacin and other Nonsteroidal anti-inflammatory medications as these will make you more likely to bleed and can cause stomach ulcers, can also cause Kidney problems. 5)Very Low-salt diet advised---Less than 2 gm of Sodium per day advised----ok to use Mrs DASH salt substitute instead of Salt 6)Weigh yourself daily, call if you gain more than 3 pounds in 1 day or more than 5 pounds in 1 week as your diuretic medications may need to be adjusted

## 2024-12-04 LAB — URINE CULTURE: Culture: >=100000 — AB

## 2024-12-08 ENCOUNTER — Ambulatory Visit: Admitting: Family Medicine

## 2024-12-10 ENCOUNTER — Other Ambulatory Visit: Payer: Self-pay

## 2024-12-10 ENCOUNTER — Inpatient Hospital Stay (HOSPITAL_COMMUNITY)
Admission: EM | Admit: 2024-12-10 | Discharge: 2024-12-12 | DRG: 280 | Disposition: A | Discharge status: Home / Self Care | Attending: Family Medicine | Admitting: Family Medicine

## 2024-12-10 ENCOUNTER — Encounter (HOSPITAL_COMMUNITY): Payer: Self-pay

## 2024-12-10 ENCOUNTER — Emergency Department (HOSPITAL_COMMUNITY)

## 2024-12-10 ENCOUNTER — Inpatient Hospital Stay (HOSPITAL_COMMUNITY)

## 2024-12-10 DIAGNOSIS — E1122 Type 2 diabetes mellitus with diabetic chronic kidney disease: Secondary | ICD-10-CM | POA: Diagnosis present

## 2024-12-10 DIAGNOSIS — E785 Hyperlipidemia, unspecified: Secondary | ICD-10-CM | POA: Diagnosis present

## 2024-12-10 DIAGNOSIS — F17210 Nicotine dependence, cigarettes, uncomplicated: Secondary | ICD-10-CM | POA: Diagnosis present

## 2024-12-10 DIAGNOSIS — G4733 Obstructive sleep apnea (adult) (pediatric): Secondary | ICD-10-CM | POA: Diagnosis present

## 2024-12-10 DIAGNOSIS — Z79899 Other long term (current) drug therapy: Secondary | ICD-10-CM

## 2024-12-10 DIAGNOSIS — Z83438 Family history of other disorder of lipoprotein metabolism and other lipidemia: Secondary | ICD-10-CM

## 2024-12-10 DIAGNOSIS — D631 Anemia in chronic kidney disease: Secondary | ICD-10-CM | POA: Diagnosis present

## 2024-12-10 DIAGNOSIS — N184 Chronic kidney disease, stage 4 (severe): Secondary | ICD-10-CM | POA: Diagnosis present

## 2024-12-10 DIAGNOSIS — F419 Anxiety disorder, unspecified: Secondary | ICD-10-CM | POA: Diagnosis present

## 2024-12-10 DIAGNOSIS — Z888 Allergy status to other drugs, medicaments and biological substances status: Secondary | ICD-10-CM

## 2024-12-10 DIAGNOSIS — Z8249 Family history of ischemic heart disease and other diseases of the circulatory system: Secondary | ICD-10-CM | POA: Diagnosis not present

## 2024-12-10 DIAGNOSIS — F32A Depression, unspecified: Secondary | ICD-10-CM | POA: Diagnosis present

## 2024-12-10 DIAGNOSIS — Z833 Family history of diabetes mellitus: Secondary | ICD-10-CM | POA: Diagnosis not present

## 2024-12-10 DIAGNOSIS — I2489 Other forms of acute ischemic heart disease: Secondary | ICD-10-CM | POA: Diagnosis present

## 2024-12-10 DIAGNOSIS — I214 Non-ST elevation (NSTEMI) myocardial infarction: Secondary | ICD-10-CM | POA: Diagnosis present

## 2024-12-10 DIAGNOSIS — I429 Cardiomyopathy, unspecified: Secondary | ICD-10-CM | POA: Diagnosis not present

## 2024-12-10 DIAGNOSIS — I161 Hypertensive emergency: Principal | ICD-10-CM | POA: Diagnosis present

## 2024-12-10 DIAGNOSIS — E1151 Type 2 diabetes mellitus with diabetic peripheral angiopathy without gangrene: Secondary | ICD-10-CM | POA: Diagnosis present

## 2024-12-10 DIAGNOSIS — I5023 Acute on chronic systolic (congestive) heart failure: Secondary | ICD-10-CM | POA: Diagnosis present

## 2024-12-10 DIAGNOSIS — Z993 Dependence on wheelchair: Secondary | ICD-10-CM

## 2024-12-10 DIAGNOSIS — I252 Old myocardial infarction: Secondary | ICD-10-CM | POA: Diagnosis not present

## 2024-12-10 DIAGNOSIS — I130 Hypertensive heart and chronic kidney disease with heart failure and stage 1 through stage 4 chronic kidney disease, or unspecified chronic kidney disease: Secondary | ICD-10-CM | POA: Diagnosis present

## 2024-12-10 DIAGNOSIS — J9601 Acute respiratory failure with hypoxia: Secondary | ICD-10-CM | POA: Diagnosis present

## 2024-12-10 DIAGNOSIS — E1165 Type 2 diabetes mellitus with hyperglycemia: Secondary | ICD-10-CM | POA: Diagnosis present

## 2024-12-10 DIAGNOSIS — Z7982 Long term (current) use of aspirin: Secondary | ICD-10-CM | POA: Diagnosis not present

## 2024-12-10 DIAGNOSIS — K219 Gastro-esophageal reflux disease without esophagitis: Secondary | ICD-10-CM | POA: Diagnosis present

## 2024-12-10 DIAGNOSIS — I5043 Acute on chronic combined systolic (congestive) and diastolic (congestive) heart failure: Secondary | ICD-10-CM | POA: Diagnosis present

## 2024-12-10 HISTORY — DX: Heart failure, unspecified: I50.9

## 2024-12-10 LAB — BLOOD GAS, VENOUS
Acid-base deficit: 0.3 mmol/L (ref 0.0–2.0)
Bicarbonate: 27.1 mmol/L (ref 20.0–28.0)
Drawn by: 27160
O2 Saturation: 33.7 %
Patient temperature: 35.9
pCO2, Ven: 52 mmHg (ref 44–60)
pH, Ven: 7.32 (ref 7.25–7.43)
pO2, Ven: <31 mmHg — CL (ref 32–45)

## 2024-12-10 LAB — CBC WITH DIFFERENTIAL/PLATELET
Abs Immature Granulocytes: 0.04 K/uL (ref 0.00–0.07)
Basophils Absolute: 0 K/uL (ref 0.0–0.1)
Basophils Relative: 0 %
Eosinophils Absolute: 0.1 K/uL (ref 0.0–0.5)
Eosinophils Relative: 1 %
HCT: 39.7 % (ref 39.0–52.0)
Hemoglobin: 12.7 g/dL — ABNORMAL LOW (ref 13.0–17.0)
Immature Granulocytes: 0 %
Lymphocytes Relative: 26 %
Lymphs Abs: 2.7 K/uL (ref 0.7–4.0)
MCH: 32.2 pg (ref 26.0–34.0)
MCHC: 32 g/dL (ref 30.0–36.0)
MCV: 100.8 fL — ABNORMAL HIGH (ref 80.0–100.0)
Monocytes Absolute: 0.6 K/uL (ref 0.1–1.0)
Monocytes Relative: 6 %
Neutro Abs: 6.9 K/uL (ref 1.7–7.7)
Neutrophils Relative %: 67 %
Platelets: 345 K/uL (ref 150–400)
RBC: 3.94 MIL/uL — ABNORMAL LOW (ref 4.22–5.81)
RDW: 13.5 % (ref 11.5–15.5)
WBC: 10.5 K/uL (ref 4.0–10.5)
nRBC: 0 % (ref 0.0–0.2)

## 2024-12-10 LAB — PRO BRAIN NATRIURETIC PEPTIDE: Pro Brain Natriuretic Peptide: 4920 pg/mL — ABNORMAL HIGH

## 2024-12-10 LAB — COMPREHENSIVE METABOLIC PANEL WITH GFR
ALT: 9 U/L (ref 0–44)
AST: 28 U/L (ref 15–41)
Albumin: 3.8 g/dL (ref 3.5–5.0)
Alkaline Phosphatase: 54 U/L (ref 38–126)
Anion gap: 14 (ref 5–15)
BUN: 33 mg/dL — ABNORMAL HIGH (ref 6–20)
CO2: 22 mmol/L (ref 22–32)
Calcium: 9 mg/dL (ref 8.9–10.3)
Chloride: 102 mmol/L (ref 98–111)
Creatinine, Ser: 4.06 mg/dL — ABNORMAL HIGH (ref 0.61–1.24)
GFR, Estimated: 16 mL/min — ABNORMAL LOW
Glucose, Bld: 201 mg/dL — ABNORMAL HIGH (ref 70–99)
Potassium: 4.9 mmol/L (ref 3.5–5.1)
Sodium: 137 mmol/L (ref 135–145)
Total Bilirubin: 0.2 mg/dL (ref 0.0–1.2)
Total Protein: 7.4 g/dL (ref 6.5–8.1)

## 2024-12-10 LAB — GLUCOSE, CAPILLARY
Glucose-Capillary: 156 mg/dL — ABNORMAL HIGH (ref 70–99)
Glucose-Capillary: 121 mg/dL — ABNORMAL HIGH (ref 70–99)
Glucose-Capillary: 106 mg/dL — ABNORMAL HIGH (ref 70–99)
Glucose-Capillary: 102 mg/dL — ABNORMAL HIGH (ref 70–99)

## 2024-12-10 LAB — MRSA NEXT GEN BY PCR, NASAL: MRSA by PCR Next Gen: NOT DETECTED

## 2024-12-10 LAB — TROPONIN T, HIGH SENSITIVITY
Troponin T High Sensitivity: 145 ng/L (ref 0–19)
Troponin T High Sensitivity: 170 ng/L (ref 0–19)
Troponin T High Sensitivity: 245 ng/L (ref 0–19)

## 2024-12-10 MED ORDER — HEPARIN SODIUM (PORCINE) 5000 UNIT/ML IJ SOLN
5000.0000 [IU] | Freq: Three times a day (TID) | INTRAMUSCULAR | Status: DC
  Administered 2024-12-10 – 2024-12-12 (×6): 5000 [IU] via SUBCUTANEOUS
  Filled 2024-12-10 (×7): qty 1

## 2024-12-10 MED ORDER — HYDRALAZINE HCL 20 MG/ML IJ SOLN
10.0000 mg | Freq: Once | INTRAMUSCULAR | Status: AC
  Administered 2024-12-10: 10 mg via INTRAVENOUS
  Filled 2024-12-10: qty 1

## 2024-12-10 MED ORDER — ATORVASTATIN CALCIUM 40 MG PO TABS
80.0000 mg | ORAL_TABLET | Freq: Every day | ORAL | Status: DC
  Administered 2024-12-11 – 2024-12-12 (×2): 80 mg via ORAL
  Filled 2024-12-10 (×3): qty 2

## 2024-12-10 MED ORDER — FUROSEMIDE 10 MG/ML IJ SOLN
80.0000 mg | Freq: Once | INTRAMUSCULAR | Status: AC
  Administered 2024-12-10: 80 mg via INTRAVENOUS
  Filled 2024-12-10: qty 8

## 2024-12-10 MED ORDER — MORPHINE SULFATE (PF) 4 MG/ML IV SOLN
4.0000 mg | Freq: Once | INTRAVENOUS | Status: AC
  Administered 2024-12-10: 4 mg via INTRAVENOUS
  Filled 2024-12-10: qty 1

## 2024-12-10 MED ORDER — CHLORHEXIDINE GLUCONATE CLOTH 2 % EX PADS
6.0000 | MEDICATED_PAD | Freq: Every day | CUTANEOUS | Status: DC
  Administered 2024-12-10 – 2024-12-12 (×3): 6 via TOPICAL

## 2024-12-10 MED ORDER — CARVEDILOL 12.5 MG PO TABS
25.0000 mg | ORAL_TABLET | Freq: Two times a day (BID) | ORAL | Status: DC
  Administered 2024-12-10 – 2024-12-12 (×4): 25 mg via ORAL
  Filled 2024-12-10 (×5): qty 2

## 2024-12-10 MED ORDER — PANTOPRAZOLE SODIUM 40 MG PO TBEC
40.0000 mg | DELAYED_RELEASE_TABLET | Freq: Every day | ORAL | Status: DC
  Administered 2024-12-11 – 2024-12-12 (×2): 40 mg via ORAL
  Filled 2024-12-10 (×3): qty 1

## 2024-12-10 MED ORDER — OXYCODONE HCL 5 MG PO TABS
5.0000 mg | ORAL_TABLET | ORAL | Status: DC | PRN
  Administered 2024-12-10 – 2024-12-12 (×5): 5 mg via ORAL
  Filled 2024-12-10 (×5): qty 1

## 2024-12-10 MED ORDER — ONDANSETRON HCL 4 MG/2ML IJ SOLN
4.0000 mg | Freq: Four times a day (QID) | INTRAMUSCULAR | Status: DC | PRN
  Administered 2024-12-10 – 2024-12-11 (×2): 4 mg via INTRAVENOUS
  Filled 2024-12-10: qty 2

## 2024-12-10 MED ORDER — LOSARTAN POTASSIUM 50 MG PO TABS
50.0000 mg | ORAL_TABLET | Freq: Every day | ORAL | Status: DC
  Administered 2024-12-10 – 2024-12-12 (×3): 50 mg via ORAL
  Filled 2024-12-10 (×3): qty 1

## 2024-12-10 MED ORDER — INSULIN ASPART 100 UNIT/ML IJ SOLN
0.0000 [IU] | INTRAMUSCULAR | Status: DC
  Administered 2024-12-10: 1 [IU] via SUBCUTANEOUS
  Administered 2024-12-10: 2 [IU] via SUBCUTANEOUS
  Administered 2024-12-11: 1 [IU] via SUBCUTANEOUS
  Filled 2024-12-10 (×3): qty 1

## 2024-12-10 MED ORDER — ISOSORB DINITRATE-HYDRALAZINE 20-37.5 MG PO TABS
2.0000 | ORAL_TABLET | Freq: Three times a day (TID) | ORAL | Status: DC
  Administered 2024-12-10 – 2024-12-11 (×5): 2 via ORAL
  Filled 2024-12-10 (×6): qty 2

## 2024-12-10 MED ORDER — ESCITALOPRAM OXALATE 10 MG PO TABS
20.0000 mg | ORAL_TABLET | Freq: Every day | ORAL | Status: DC
  Administered 2024-12-11 – 2024-12-12 (×2): 20 mg via ORAL
  Filled 2024-12-10 (×3): qty 2

## 2024-12-10 MED ORDER — FUROSEMIDE 10 MG/ML IJ SOLN
40.0000 mg | Freq: Every day | INTRAMUSCULAR | Status: DC
  Administered 2024-12-10: 40 mg via INTRAVENOUS
  Filled 2024-12-10: qty 4

## 2024-12-10 MED ORDER — NITROGLYCERIN IN D5W 200-5 MCG/ML-% IV SOLN
5.0000 ug/min | INTRAVENOUS | Status: DC
  Administered 2024-12-10: 10 ug/min via INTRAVENOUS
  Administered 2024-12-10 (×2): 200 ug/min via INTRAVENOUS
  Filled 2024-12-10 (×4): qty 250

## 2024-12-10 MED ORDER — ONDANSETRON HCL 4 MG/2ML IJ SOLN
INTRAMUSCULAR | Status: AC
  Filled 2024-12-10: qty 2

## 2024-12-10 MED ORDER — ASPIRIN 81 MG PO TBEC
81.0000 mg | DELAYED_RELEASE_TABLET | Freq: Every day | ORAL | Status: DC
  Administered 2024-12-11 – 2024-12-12 (×2): 81 mg via ORAL
  Filled 2024-12-10 (×3): qty 1

## 2024-12-10 NOTE — ED Provider Notes (Signed)
 " [place] EMERGENCY DEPARTMENT AT [facility] Provider Note   CSN: [id number] Arrival date & time: 12/10/24  9662     Patient presents with: Shortness of Breath   Taylor Obrien is a 59 y.o. male.   Patient brought to the emergency department by ambulance from home in respiratory distress.  Patient woke up feeling extremely short of breath.  EMS reports that they found him in severe respiratory distress, slumped over.  [name] initial oxygen  saturations were 64% on room air.  Initial blood pressure was 250/150.  [name] oxygen  saturation did not significantly improve on nonrebreather facemask, placed on CPAP, given a sublingual nitroglycerin  and brought to the ED.       Prior to Admission medications  Medication Sig Start Date End Date Taking? Authorizing Provider  acetaminophen  (TYLENOL ) 325 MG tablet Take 2 tablets (650 mg total) by mouth every 6 (six) hours as needed for mild pain (pain score 1-3) or fever (or Fever >/= 101). 12/03/24   Pearlean Manus, MD  aspirin  EC 81 MG tablet Take 1 tablet (81 mg total) by mouth daily with breakfast. 12/03/24 12/03/25  Pearlean Manus, MD  atorvastatin  (LIPITOR ) 80 MG tablet Take 1 tablet (80 mg total) by mouth daily. 12/03/24   Pearlean Manus, MD  calcitRIOL  (ROCALTROL ) 0.25 MCG capsule Take 1 capsule (0.25 mcg total) by mouth every Monday, Wednesday, and Friday. 11/08/24 02/06/25  Pokhrel, Laxman, MD  carvedilol  (COREG ) 25 MG tablet Take 1 tablet (25 mg total) by mouth 2 (two) times daily with a meal. 12/03/24 03/03/25  Pearlean Manus, MD  escitalopram  (LEXAPRO ) 20 MG tablet Take 1 tablet (20 mg total) by mouth daily. 12/03/24   Pearlean Manus, MD  fenofibrate  (TRICOR ) 145 MG tablet Take 145 mg by mouth daily. 04/10/22   [provider]  isosorbide -hydrALAZINE  (BIDIL ) 20-37.5 MG tablet Take 2 tablets by mouth 3 (three) times daily. 12/03/24   Pearlean Manus, MD  lidocaine  (LIDODERM ) 5 % Place 2 patches onto the skin  daily. Remove & Discard patch within 12 hours or as directed by MD Patient taking differently: Place 2 patches onto the skin daily as needed (Pain). Remove & Discard patch within 12 hours or as directed by MD 04/26/24   Donnelly Mellow, MD  loperamide  (IMODIUM ) 2 MG capsule Take 1 capsule (2 mg total) by mouth as needed for diarrhea or loose stools (Loose stools/Diarrhea). 12/03/24   Pearlean Manus, MD  methocarbamol  (ROBAXIN ) 500 MG tablet Take 500 mg by mouth every 8 (eight) hours as needed. 11/16/24   [provider]  pantoprazole  (PROTONIX ) 40 MG tablet Take 1 tablet (40 mg total) by mouth daily. 12/03/24   Pearlean Manus, MD  potassium chloride  (KLOR-CON ) 10 MEQ tablet Take 10 mEq by mouth daily. 12/03/24   [provider]  torsemide  (DEMADEX ) 20 MG tablet Take 1 tablet (20 mg total) by mouth 2 (two) times daily. 12/03/24   Pearlean Manus, MD  VASCEPA  1 g capsule Take 2 g by mouth 2 (two) times daily. Patient not taking: Reported on 11/28/2024 03/12/22   [provider]  Vitamin D , Ergocalciferol , (DRISDOL ) 1.25 MG (50000 UNIT) CAPS capsule Take 1 capsule (50,000 Units total) by mouth every 7 (seven) days. 11/10/24   Pokhrel, Laxman, MD    Allergies: Ibuprofen  and Oxcarbazepine    Review of Systems  Updated Vital Signs BP (!) 172/134   Pulse (!) 107   Resp (!) 29   Ht 5' 6 (1.676 m)   Wt 72.6  kg   SpO2 97%   BMI 25.82 kg/m   Physical Exam Vitals and nursing note reviewed.  Constitutional:      General: He is in acute distress.     Appearance: He is well-developed.  HENT:     Head: Normocephalic and atraumatic.     Mouth/Throat:     Mouth: Mucous membranes are moist.  Eyes:     General: Vision grossly intact. Gaze aligned appropriately.     Extraocular Movements: Extraocular movements intact.     Conjunctiva/sclera: Conjunctivae normal.  Cardiovascular:     Rate and Rhythm: Normal rate and regular rhythm.     Pulses: Normal pulses.     Heart  sounds: Normal heart sounds, S1 normal and S2 normal. No murmur heard.    No friction rub. No gallop.  Pulmonary:     Effort: Tachypnea, accessory muscle usage and respiratory distress present.     Breath sounds: Decreased breath sounds and rales present.  Abdominal:     Palpations: Abdomen is soft.     Tenderness: There is no abdominal tenderness. There is no guarding or rebound.     Hernia: No hernia is present.  Musculoskeletal:        General: No swelling.     Cervical back: Full passive range of motion without pain, normal range of motion and neck supple. No pain with movement, spinous process tenderness or muscular tenderness. Normal range of motion.     Right lower leg: No edema.     Left lower leg: No edema.  Skin:    General: Skin is warm and dry.     Capillary Refill: Capillary refill takes less than 2 seconds.     Findings: No ecchymosis, erythema, lesion or wound.  Neurological:     Mental Status: He is alert and oriented to person, place, and time.     GCS: GCS eye subscore is 4. GCS verbal subscore is 5. GCS motor subscore is 6.     Cranial Nerves: Cranial nerves 2-12 are intact.     Sensory: Sensation is intact.     Motor: Motor function is intact. No weakness or abnormal muscle tone.     Coordination: Coordination is intact.  Psychiatric:        Mood and Affect: Mood normal.        Speech: Speech normal.        Behavior: Behavior normal.     (all labs ordered are listed, but only abnormal results are displayed) Labs Reviewed  CBC WITH DIFFERENTIAL/PLATELET - Abnormal; Notable for the following components:      Result Value   RBC 3.94 (*)    Hemoglobin 12.7 (*)    MCV 100.8 (*)    All other components within normal limits  COMPREHENSIVE METABOLIC PANEL WITH GFR - Abnormal; Notable for the following components:   Glucose, Bld 201 (*)    BUN 33 (*)    Creatinine, Ser 4.06 (*)    GFR, Estimated 16 (*)    All other components within normal limits  PRO BRAIN  NATRIURETIC PEPTIDE - Abnormal; Notable for the following components:   Pro Brain Natriuretic Peptide 4,920.0 (*)    All other components within normal limits  TROPONIN T, HIGH SENSITIVITY - Abnormal; Notable for the following components:   Troponin T High Sensitivity 145 (*)    All other components within normal limits  TROPONIN T, HIGH SENSITIVITY    EKG: None  Radiology: [facility] 1 [address]  Result Date: 12/10/2024 EXAM: 1 VIEW(S) XRAY OF THE CHEST 12/10/2024 04:07:20 AM COMPARISON: 11/25/2024 CLINICAL HISTORY: shortness of breath FINDINGS: LUNGS AND PLEURA: Diffuse interstitial and airspace opacities. No pleural effusion. No pneumothorax. HEART AND MEDIASTINUM: Cardiomegaly. Atherosclerotic calcifications. BONES AND SOFT TISSUES: No acute osseous abnormality. CHEST WALL AND UPPER ABDOMEN: Gaseous distension of stomach. IMPRESSION: 1. Diffuse interstitial and airspace opacities compatible with pulmonary edema, likely due to congestive heart failure. 2. Cardiomegaly. Electronically signed by: Waddell Calk MD 12/10/2024 05:08 AM EST RP Workstation: [id number]     Procedures   Medications Ordered in the ED  nitroGLYCERIN  50 mg in dextrose  5 % 250 mL (0.2 mg/mL) infusion (200 mcg/min Intravenous Rate/Dose Change 12/10/24 0500)  furosemide  (LASIX ) injection 80 mg (80 mg Intravenous Given 12/10/24 0349)  morphine  (PF) 4 MG/ML injection 4 mg (4 mg Intravenous Given 12/10/24 0507)  hydrALAZINE  (APRESOLINE ) injection 10 mg (10 mg Intravenous Given 12/10/24 0509)                                    Medical Decision Making Amount and/or Complexity of Data Reviewed External Data Reviewed: labs, radiology, ECG and notes. Labs: ordered. Decision-making details documented in ED Course. Radiology: ordered and independent interpretation performed. Decision-making details documented in ED Course. ECG/medicine tests: ordered and independent interpretation performed. Decision-making details documented in ED  Course.  Risk Prescription drug management.   Differential Diagnosis considered includes, but not limited to: CHF exacerbation; ACS/MI; Bronchitis/URI; Pneumonia; PE  Patient presents to the emergency department for evaluation of shortness of breath.  Patient had onset of severe difficulty breathing tonight.  Patient with history of heart failure with reduced ejection fraction.  EMS reported that the patient was in severe distress and very hypoxic, did not significantly improved with supplemental oxygen .  He was brought to the ED on CPAP.  At arrival patient in some distress but interactive, alert.  Patient markedly hypertensive.  Treatment for congestive heart failure exacerbation secondary to hypertensive emergency initiated.  Patient has had nitroglycerin  titrated.  Blood pressures are coming down but diastolic still elevated.  He has been given IV Lasix .  Morphine  and hydralazine  added for further blood pressure control.  BNP markedly elevated.  Troponin slightly elevated, consistent with congestive heart failure exacerbation and hypertension, not felt to be consistent with ACS.  CRITICAL CARE Performed by: Lonni JINNY Seats   Total critical care time: 32 minutes  Critical care time was exclusive of separately billable procedures and treating other patients.  Critical care was necessary to treat or prevent imminent or life-threatening deterioration.  Critical care was time spent personally by me on the following activities: development of treatment plan with patient and/or surrogate as well as nursing, discussions with consultants, evaluation of patient's response to treatment, examination of patient, obtaining history from patient or surrogate, ordering and performing treatments and interventions, ordering and review of laboratory studies, ordering and review of radiographic studies, pulse oximetry and re-evaluation of patient's condition.      Final diagnoses:  Hypertensive  emergency  Acute on chronic combined systolic and diastolic congestive heart failure [facility])    ED Discharge Orders     None          Seats Lonni JINNY, MD 12/10/24 [phone]  "

## 2024-12-10 NOTE — Consult Note (Signed)
 "   CARDIOLOGY CONSULT NOTE    Patient ID: Taylor Obrien; [id number]; 06-15-1966   Admit date: 12/10/2024 Date of Consult: 12/10/2024  Primary Care Provider: Trudy Ave, NP Primary Cardiologist:  Primary Electrophysiologist:     Patient Profile:   Taylor Obrien is a 59 y.o. male with a hx of CDM, HTN who is being seen today for the evaluation of SOB at the request of Dr Bryn.  History of Present Illness:   Taylor Obrien is a 60 year old M known to have cardiomyopathy with LVEF 30% (unknown etiology), CKD stage IV, poorly controlled HTN, OSA intolerant to CPAP, history of CVA with residual left-sided hemiparesis presented to the ER with sudden onset of chest pain and SOB since last night.  He is currently admitted to hospitalist team for the management of acute on chronic systolic and diastolic heart failure exacerbation and hypertensive emergency.  proBNP was elevated, 4920.  Hs troponins are elevated, 145>> 170>> 245.  EKG showed sinus tachycardia, LVH with repolarization abnormality, cannot rule out CAD.  Serum creatinine 4.6 on admission, improved to 4 this a.m.  Baseline around this range.  He is currently trying to establish care with PCP and nephrology.  He reported that he was diagnosed with HTN when he was a kid.  He reported compliance with medications.  His daughters help with arranging the medications.  He reported that he was hungry last night and hence he heated canned ravioli and ate.  As soon as he ate ravioli and laid down, he had sudden onset of chest pain and SOB prompting ER visit.  On BiPAP during my interview.  Chest x-ray showed diffuse interstitial and airspace opacities compatible with pulmonary edema, likely due to CHF and cardiomegaly.  Past Medical History:  Diagnosis Date   Acute alcoholic pancreatitis    Arthritis    CHF (congestive heart failure) (HCC)    Chronic pain    Depression    Diabetes mellitus without complication (HCC)    Essential hypertension,  benign    GERD (gastroesophageal reflux disease)    Headache    HTN (hypertension) 11/27/2015   Hyperlipidemia    Peripheral vascular disease    Sleep apnea    could not tolerate CPAP   Stroke (HCC)    2016    Past Surgical History:  Procedure Laterality Date   BIOPSY  08/19/2022   Procedure: BIOPSY;  Surgeon: Cindie Carlin POUR, DO;  Location: [facility];  Service: Endoscopy;;   CLOSED REDUCTION MANDIBULAR FRACTURE W/ ARCH BARS     + multiple extractions   COLONOSCOPY WITH PROPOFOL  N/A 08/19/2022   Procedure: COLONOSCOPY WITH PROPOFOL ;  Surgeon: Cindie Carlin POUR, DO;  Location: [facility];  Service: Endoscopy;  Laterality: N/A;  9:15am   Condyloma resection     ESOPHAGOGASTRODUODENOSCOPY (EGD) WITH PROPOFOL  N/A 08/19/2022   Procedure: ESOPHAGOGASTRODUODENOSCOPY (EGD) WITH PROPOFOL ;  Surgeon: Cindie Carlin POUR, DO;  Location: [facility];  Service: Endoscopy;  Laterality: N/A;   MULTIPLE EXTRACTIONS WITH ALVEOLOPLASTY Bilateral 01/23/2018   Procedure: DENTAL EXTRACTION OF TEETH NUMBER ONE, TWO, THREE, FOUR, FIVE, SIX, SEVEN, EIGHT, NINE, TEN, ELEVEN, TWELVE, THIRTEEN, FOURTEEN, FIFTEEN, SIXTEEN, SEVENTEEN, TWENTY, TWENTY-ONE, TWENTY-TWO, TWENTY-THREE, TWENTY-FOUR, TWENTY-FIVE, TWENTY-SIX, TWENTY-SEVEN, TWENTY-EIGHT, TWENTY-NINE, THIRTY-TWO WITH ALVEOLOPLASTY;  Surgeon: Sheryle Hamilton, DDS;  Location: [facility];  Service: Oral Surgery;  Lat   POLYPECTOMY  08/19/2022   Procedure: POLYPECTOMY;  Surgeon: Cindie Carlin POUR, DO;  Location: [facility];  Service: Endoscopy;;   RADIOLOGY WITH  ANESTHESIA N/A 11/18/2015   Procedure: RADIOLOGY WITH ANESTHESIA;  Surgeon: Thyra Nash, MD;  Location: [facility] OR;  Service: Radiology;  Laterality: N/A;   Removal foreign body right shoulder     Right rotator cuff repair     TEE WITHOUT CARDIOVERSION N/A 11/21/2015   Procedure: TRANSESOPHAGEAL ECHOCARDIOGRAM (TEE);  Surgeon: Ezra GORMAN Shuck, MD;  Location: [facility] ENDOSCOPY;  Service: Cardiovascular;   Laterality: N/A;   TOOTH EXTRACTION N/A 01/24/2018   Procedure: SUTURE ORAL WOUND;  Surgeon: Sheryle Hamilton, DDS;  Location: [facility] OR;  Service: Oral Surgery;  Laterality: N/A;       Inpatient Medications: Scheduled Meds:  aspirin  EC  81 mg Oral Q breakfast   atorvastatin   80 mg Oral Daily   carvedilol   25 mg Oral BID WC   Chlorhexidine  Gluconate Cloth  6 each Topical Q0600   escitalopram   20 mg Oral Daily   furosemide   80 mg Intravenous Once   heparin   5,000 Units Subcutaneous Q8H   insulin  aspart  0-9 Units Subcutaneous Q4H   isosorbide -hydrALAZINE   2 tablet Oral TID   losartan  50 mg Oral Daily   pantoprazole   40 mg Oral Daily   Continuous Infusions:  nitroGLYCERIN  200 mcg/min (12/10/24 1331)   PRN Meds:   Allergies:   Allergies[1]  Social History:   Social History   Socioeconomic History   Marital status: Married    Spouse name: Not on file   Number of children: Not on file   Years of education: Not on file   Highest education level: Not on file  Occupational History   Not on file  Tobacco Use   Smoking status: Every Day    Current packs/day: 0.50    Average packs/day: 0.5 packs/day for 30.0 years (15.0 ttl pk-yrs)    Types: Cigarettes   Smokeless tobacco: Never  Vaping Use   Vaping status: Never Used  Substance and Sexual Activity   Alcohol use: Not Currently    Comment: 2 40 oz daily. 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 Stopped liquor 5 years ago.states no etoh since 03/2022   Drug use: Yes    Types: Marijuana    Comment: occassionally   Sexual activity: Yes    Birth control/protection: None  Other Topics Concern   Not on file  Social History Narrative   Not on file   Social Drivers of Health   Tobacco Use: High Risk (12/10/2024)   Patient History    Smoking Tobacco Use: Every Day    Smokeless Tobacco Use: Never    Passive Exposure: Not on file  Financial Resource Strain: Not on file  Food Insecurity: Food Insecurity Present (12/10/2024)   Epic     Worried About Programme Researcher, Broadcasting/film/video in the Last Year: Sometimes true    Ran Out of Food in the Last Year: Sometimes true  Transportation Needs: No Transportation Needs (12/10/2024)   Epic    Lack of Transportation (Medical): No    Lack of Transportation (Non-Medical): No  Physical Activity: Not on file  Stress: Not on file  Social Connections: Unknown (12/10/2024)   Social Connection and Isolation Panel    Frequency of Communication with Friends and Family: Three times a week    Frequency of Social Gatherings with Friends and Family: Once a week    Attends Religious Services: 1 to 4 times per year    Active Member of [employer] or Organizations: No    Attends Banker Meetings: 1 to 4  times per year    Marital Status: Patient declined  Intimate Partner Violence: Not At Risk (12/10/2024)   Epic    Fear of Current or Ex-Partner: No    Emotionally Abused: No    Physically Abused: No    Sexually Abused: No  Depression (PHQ2-9): Not on file  Alcohol Screen: High Risk (04/21/2024)   Alcohol Screen    Last Alcohol Screening Score (AUDIT): 29  Housing: Low Risk (12/10/2024)   Epic    Unable to Pay for Housing in the Last Year: No    Number of Times Moved in the Last Year: 0    Homeless in the Last Year: No  Utilities: At Risk (12/10/2024)   Epic    Threatened with loss of utilities: Yes  Health Literacy: Not on file    Family History:    Family History  Problem Relation Age of Onset   Diabetes Mother    Hypertension Mother    Drug abuse Mother    Hyperlipidemia Mother    Diabetes Father    Hypertension Father    Hyperlipidemia Father    Diabetes Brother    Hypertension Brother      ROS:  Please see the history of present illness.  ROS  All other ROS reviewed and negative.     Physical Exam/Data:   Vitals:   12/10/24 1200 12/10/24 1215 12/10/24 1230 12/10/24 1245  BP: (!) 176/110 (!) 175/108 (!) 166/106 (!) 175/106  Pulse: 81 77 82 80  Resp: 13 12 13 14   Temp:       TempSrc:      SpO2: 100% 100% 100% 100%  Weight:      Height:        Intake/Output Summary (Last 24 hours) at 12/10/2024 1346 Last data filed at 12/10/2024 1106 Gross per 24 hour  Intake --  Output 400 ml  Net -400 ml   Filed Weights   12/10/24 0341 12/10/24 0817  Weight: 72.6 kg 72.4 kg   Body mass index is 25.76 kg/m.  General:  Well nourished, well developed, in no acute distress HEENT: normal Lymph: no adenopathy Neck: JVD elevated Endocrine:  No thryomegaly Vascular: No carotid bruits; FA pulses 2+ bilaterally without bruits  Cardiac:  normal S1, S2; RRR; no murmur  Lungs:  clear to auscultation bilaterally, no wheezing, rhonchi or rales  Abd: soft, nontender, no hepatomegaly  Ext: no edema Musculoskeletal:  No deformities, BUE and BLE strength normal and equal Skin: warm and dry  Neuro:  CNs 2-12 intact, no focal abnormalities noted Psych:  Normal affect   Laboratory Data:  Chemistry Recent Labs  Lab 12/10/24 0345  NA 137  K 4.9  CL 102  CO2 22  GLUCOSE 201*  BUN 33*  CREATININE 4.06*  CALCIUM  9.0  GFRNONAA 16*  ANIONGAP 14    Recent Labs  Lab 12/10/24 0345  PROT 7.4  ALBUMIN 3.8  AST 28  ALT 9  ALKPHOS 54  BILITOT 0.2   Hematology Recent Labs  Lab 12/10/24 0345  WBC 10.5  RBC 3.94*  HGB 12.7*  HCT 39.7  MCV 100.8*  MCH 32.2  MCHC 32.0  RDW 13.5  PLT 345   Cardiac EnzymesNo results for input(s): TROPONINI in the last 168 hours. No results for input(s): TROPIPOC in the last 168 hours.  BNP Recent Labs  Lab 12/10/24 0345  PROBNP 4,920.0*    DDimer No results for input(s): DDIMER in the last 168 hours.  Radiology/Studies:  BOEING  Chest Port 1 View Result Date: 12/10/2024 EXAM: 1 VIEW(S) XRAY OF THE CHEST 12/10/2024 04:07:20 AM COMPARISON: 11/25/2024 CLINICAL HISTORY: shortness of breath FINDINGS: LUNGS AND PLEURA: Diffuse interstitial and airspace opacities. No pleural effusion. No pneumothorax. HEART AND MEDIASTINUM:  Cardiomegaly. Atherosclerotic calcifications. BONES AND SOFT TISSUES: No acute osseous abnormality. CHEST WALL AND UPPER ABDOMEN: Gaseous distension of stomach. IMPRESSION: 1. Diffuse interstitial and airspace opacities compatible with pulmonary edema, likely due to congestive heart failure. 2. Cardiomegaly. Electronically signed by: Waddell Calk MD 12/10/2024 05:08 AM EST RP Workstation: [id number]    Assessment and Plan:   Hypertensive emergency c/w acute on chronic systolic and diastolic heart failure - Presented to the ER with sudden onset of chest pain and SOB last night after eating canned ravioli.  BP upon arrival to the ER was 235/144 mmHg. - proBNP elevated, 4920. Hs troponins elevated, 145>> 170>> 245. - Chest x-ray showed findings consistent with pulmonary vascular congestion/edema and cardiomegaly. - Serum creatinine 4.6 on admission, currently 4 this a.m.  He has underlying CKD stage IV.  Does not have established follow-up with nephrology yet. - Currently on IV Lasix  40 mg once daily.  On BiPAP during the interview.  Will administer IV Lasix  80 mg 1 dose today and reassess symptoms. - On NTG drip, at 200 mcg/min. Maxed out.  Complaining of headaches.  Will wean off and discontinue. - On p.o. Coreg  25 mg twice daily and BiDil  20-37.5 mg (2 tablets) TID which I restarted this admission, but not given to the patient yet.  Spoke with the nurse to administer ASAP. - Start losartan 50 mg once daily.  His UA shows significant proteinuria. - If his blood pressures continue to be elevated with the above medications, will increase losartan from 50 mg to 100 mg once daily or/and start dihydropyridine calcium  channel blockers.  Cardiomyopathy LVEF 30 to 35% - Chronic.  Likely from poorly controlled HTN versus CAD.  LHC was deferred due to advanced kidney disease in the past.  Questionable medication compliance.  But patient and patient daughters reported that he has been compliant with  antihypertensive medications lately.  He is currently single stage IV.  If/when he transitions to dialysis at some point, he will need at least diagnostic LHC and intervention depending on medication compliance.  CKD stage IV - Does not have established nephrology follow-up.  [name] daughters send they are trying to get him established with nephrology doctor and PCP soon.   CRITICAL CARE Performed by: Diannah SQUIBB Abisai Deer   Total critical care time: 70 minutes  Critical care time was exclusive of separately billable procedures and treating other patients.  Critical care was necessary to treat or prevent imminent or life-threatening deterioration.  Critical care was time spent personally by me on the following activities: development of treatment plan with patient and/or surrogate as well as nursing, discussions with consultants, evaluation of patient's response to treatment, examination of patient, obtaining history from patient or surrogate, ordering and performing treatments and interventions, ordering and review of laboratory studies, ordering and review of radiographic studies, pulse oximetry and re-evaluation of patient's condition.   For questions or updates, please contact [facility] Please consult [email or web address] for contact info under Cardiology/STEMI.   Signed, Armaan Pond Priya Alix Stowers, MD 12/10/2024 1:46 PM     [1]  Allergies Allergen Reactions   Ibuprofen  Other (See Comments)    Avoid per PCP   Oxcarbazepine Other (See Comments)    Patient goes out of right state of mind.    "

## 2024-12-10 NOTE — Progress Notes (Signed)
 Patient transported from ER to ICU on BIPAP.  Patient tolerated well.  Patient in ICU and continued on BIPAP on previous settings.

## 2024-12-10 NOTE — ED Triage Notes (Signed)
 Rcems from home cc of SOB.  Was slumped over in his chair. 64% on room air. 15l nrb 7x% on C Pap 1 ntg 18g r ac

## 2024-12-10 NOTE — H&P (Addendum)
 " History and Physical  Taylor Obrien FMW:[id number] DOB: 1966-10-29 DOA: 12/10/2024  Referring physician: Dr. [place], EDP. PCP: Trudy Ave, NP  Outpatient Specialists: Cardiology Patient coming from: Home.  Chief Complaint: Shortness of breath.  HPI: Taylor Obrien is a 59 y.o. male with medical history significant for chronic HFrEF, prior CVA, peripheral artery disease, type 2 diabetes, CKD 4, OSA, who presents to the ER due to sudden onset shortness of breath that woke him out of his sleep early this morning.  Denies any chest pain.  States he was doing fine yesterday.  Denies noncompliance to home medications.  No subjective fevers or chills.  EMS was activated.  Upon EMS arrival, the patient was severely hypoxic with O2 saturation of 64% on room air.  He was placed on CPAP and brought into the ER for further evaluation.  In the ER, severely hypertensive with blood pressure of 250/150.  Chest x-ray showing cardiomegaly and pulmonary edema.  proBNP 4920.  Troponin 145.  The patient was placed on BiPAP.  The patient received IV Lasix  80 mg x 1, IV hydralazine  10 mg x 1, IV morphine  4 mg x 1.  The patient was subsequently started on nitro drip due to uncontrolled hypertension.  TRH, hospitalist service, was asked to admit.  ED Course: BP 157/105, pulse 89, respiratory rate 21, O2 saturation 99% on BiPAP.  Review of Systems: Review of systems as noted in the HPI. All other systems reviewed and are negative.   Past Medical History:  Diagnosis Date   Acute alcoholic pancreatitis    Arthritis    CHF (congestive heart failure) (HCC)    Chronic pain    Depression    Diabetes mellitus without complication (HCC)    Essential hypertension, benign    GERD (gastroesophageal reflux disease)    Headache    HTN (hypertension) 11/27/2015   Hyperlipidemia    Peripheral vascular disease    Sleep apnea    could not tolerate CPAP   Stroke (HCC)    2016   Past Surgical History:  Procedure  Laterality Date   BIOPSY  08/19/2022   Procedure: BIOPSY;  Surgeon: Cindie Carlin POUR, DO;  Location: [facility];  Service: Endoscopy;;   CLOSED REDUCTION MANDIBULAR FRACTURE W/ ARCH BARS     + multiple extractions   COLONOSCOPY WITH PROPOFOL  N/A 08/19/2022   Procedure: COLONOSCOPY WITH PROPOFOL ;  Surgeon: Cindie Carlin POUR, DO;  Location: [facility];  Service: Endoscopy;  Laterality: N/A;  9:15am   Condyloma resection     ESOPHAGOGASTRODUODENOSCOPY (EGD) WITH PROPOFOL  N/A 08/19/2022   Procedure: ESOPHAGOGASTRODUODENOSCOPY (EGD) WITH PROPOFOL ;  Surgeon: Cindie Carlin POUR, DO;  Location: [facility];  Service: Endoscopy;  Laterality: N/A;   MULTIPLE EXTRACTIONS WITH ALVEOLOPLASTY Bilateral 01/23/2018   Procedure: DENTAL EXTRACTION OF TEETH NUMBER ONE, TWO, THREE, FOUR, FIVE, SIX, SEVEN, EIGHT, NINE, TEN, ELEVEN, TWELVE, THIRTEEN, FOURTEEN, FIFTEEN, SIXTEEN, SEVENTEEN, TWENTY, TWENTY-ONE, TWENTY-TWO, TWENTY-THREE, TWENTY-FOUR, TWENTY-FIVE, TWENTY-SIX, TWENTY-SEVEN, TWENTY-EIGHT, TWENTY-NINE, THIRTY-TWO WITH ALVEOLOPLASTY;  Surgeon: Sheryle Hamilton, DDS;  Location: [facility] OR;  Service: Oral Surgery;  Lat   POLYPECTOMY  08/19/2022   Procedure: POLYPECTOMY;  Surgeon: Cindie Carlin POUR, DO;  Location: [facility];  Service: Endoscopy;;   RADIOLOGY WITH ANESTHESIA N/A 11/18/2015   Procedure: RADIOLOGY WITH ANESTHESIA;  Surgeon: Thyra Nash, MD;  Location: [facility] OR;  Service: Radiology;  Laterality: N/A;   Removal foreign body right shoulder     Right rotator cuff repair     TEE  WITHOUT CARDIOVERSION N/A 11/21/2015   Procedure: TRANSESOPHAGEAL ECHOCARDIOGRAM (TEE);  Surgeon: Ezra GORMAN Shuck, MD;  Location: [facility] ENDOSCOPY;  Service: Cardiovascular;  Laterality: N/A;   TOOTH EXTRACTION N/A 01/24/2018   Procedure: SUTURE ORAL WOUND;  Surgeon: Sheryle Hamilton, DDS;  Location: [facility] OR;  Service: Oral Surgery;  Laterality: N/A;    Social History:  reports that he has been smoking cigarettes. He has a 15  pack-year smoking history. He has never used smokeless tobacco. He reports that he does not currently use alcohol. He reports current drug use. Drug: Marijuana.   Allergies[1]  Family History  Problem Relation Age of Onset   Diabetes Mother    Hypertension Mother    Drug abuse Mother    Hyperlipidemia Mother    Diabetes Father    Hypertension Father    Hyperlipidemia Father    Diabetes Brother    Hypertension Brother       Prior to Admission medications  Medication Sig Start Date End Date Taking? Authorizing Provider  acetaminophen  (TYLENOL ) 325 MG tablet Take 2 tablets (650 mg total) by mouth every 6 (six) hours as needed for mild pain (pain score 1-3) or fever (or Fever >/= 101). 12/03/24   Pearlean Manus, MD  aspirin  EC 81 MG tablet Take 1 tablet (81 mg total) by mouth daily with breakfast. 12/03/24 12/03/25  Pearlean Manus, MD  atorvastatin  (LIPITOR ) 80 MG tablet Take 1 tablet (80 mg total) by mouth daily. 12/03/24   Pearlean Manus, MD  calcitRIOL  (ROCALTROL ) 0.25 MCG capsule Take 1 capsule (0.25 mcg total) by mouth every Monday, Wednesday, and Friday. 11/08/24 02/06/25  Pokhrel, Laxman, MD  carvedilol  (COREG ) 25 MG tablet Take 1 tablet (25 mg total) by mouth 2 (two) times daily with a meal. 12/03/24 03/03/25  Pearlean Manus, MD  escitalopram  (LEXAPRO ) 20 MG tablet Take 1 tablet (20 mg total) by mouth daily. 12/03/24   Pearlean Manus, MD  fenofibrate  (TRICOR ) 145 MG tablet Take 145 mg by mouth daily. 04/10/22   [provider]  isosorbide -hydrALAZINE  (BIDIL ) 20-37.5 MG tablet Take 2 tablets by mouth 3 (three) times daily. 12/03/24   Pearlean Manus, MD  lidocaine  (LIDODERM ) 5 % Place 2 patches onto the skin daily. Remove & Discard patch within 12 hours or as directed by MD Patient taking differently: Place 2 patches onto the skin daily as needed (Pain). Remove & Discard patch within 12 hours or as directed by MD 04/26/24   Donnelly Mellow, MD  loperamide  (IMODIUM ) 2 MG  capsule Take 1 capsule (2 mg total) by mouth as needed for diarrhea or loose stools (Loose stools/Diarrhea). 12/03/24   Pearlean Manus, MD  methocarbamol  (ROBAXIN ) 500 MG tablet Take 500 mg by mouth every 8 (eight) hours as needed. 11/16/24   [provider]  pantoprazole  (PROTONIX ) 40 MG tablet Take 1 tablet (40 mg total) by mouth daily. 12/03/24   Pearlean Manus, MD  potassium chloride  (KLOR-CON ) 10 MEQ tablet Take 10 mEq by mouth daily. 12/03/24   [provider]  torsemide  (DEMADEX ) 20 MG tablet Take 1 tablet (20 mg total) by mouth 2 (two) times daily. 12/03/24   Pearlean Manus, MD  VASCEPA  1 g capsule Take 2 g by mouth 2 (two) times daily. Patient not taking: Reported on 11/28/2024 03/12/22   [provider]  Vitamin D , Ergocalciferol , (DRISDOL ) 1.25 MG (50000 UNIT) CAPS capsule Take 1 capsule (50,000 Units total) by mouth every 7 (seven) days. 11/10/24   Sonjia Held, MD    Physical Exam:  BP (!) 148/103   Pulse 89   Resp (!) 22   Ht 5' 6 (1.676 m)   Wt 72.6 kg   SpO2 98%   BMI 25.82 kg/m   General: 59 y.o. year-old male well developed well nourished in no acute distress.  Alert and oriented x3. Cardiovascular: Regular rate and rhythm with no rubs or gallops.  No thyromegaly noted.  Trace lower extremity edema bilaterally. Respiratory: Diffuse rales bilaterally.  On BiPAP.  Poor inspiratory effort. Abdomen: Soft nontender nondistended with normal bowel sounds x4 quadrants. Muskuloskeletal: No cyanosis or clubbing noted bilaterally Neuro: CN II-XII intact, strength, sensation, reflexes Skin: No ulcerative lesions noted or rashes Psychiatry: Judgement and insight appear normal. Mood is appropriate for condition and setting          Labs on Admission:  Basic Metabolic Panel: Recent Labs  Lab 12/10/24 0345  NA 137  K 4.9  CL 102  CO2 22  GLUCOSE 201*  BUN 33*  CREATININE 4.06*  CALCIUM  9.0   Liver Function Tests: Recent Labs  Lab  12/10/24 0345  AST 28  ALT 9  ALKPHOS 54  BILITOT 0.2  PROT 7.4  ALBUMIN 3.8   No results for input(s): LIPASE, AMYLASE in the last 168 hours. No results for input(s): AMMONIA in the last 168 hours. CBC: Recent Labs  Lab 12/10/24 0345  WBC 10.5  NEUTROABS 6.9  HGB 12.7*  HCT 39.7  MCV 100.8*  PLT 345   Cardiac Enzymes: No results for input(s): CKTOTAL, CKMB, CKMBINDEX, TROPONINI in the last 168 hours.  BNP (last 3 results) No results for input(s): BNP in the last 8760 hours.  ProBNP (last 3 results) Recent Labs    10/30/24 2210 11/25/24 1925 12/10/24 0345  PROBNP 6,550.0* 5,832.0* 4,920.0*    CBG: Recent Labs  Lab 12/03/24 0748 12/03/24 1153  GLUCAP 106* 140*    Radiological Exams on Admission: DG Chest Port 1 View Result Date: 12/10/2024 EXAM: 1 VIEW(S) XRAY OF THE CHEST 12/10/2024 04:07:20 AM COMPARISON: 11/25/2024 CLINICAL HISTORY: shortness of breath FINDINGS: LUNGS AND PLEURA: Diffuse interstitial and airspace opacities. No pleural effusion. No pneumothorax. HEART AND MEDIASTINUM: Cardiomegaly. Atherosclerotic calcifications. BONES AND SOFT TISSUES: No acute osseous abnormality. CHEST WALL AND UPPER ABDOMEN: Gaseous distension of stomach. IMPRESSION: 1. Diffuse interstitial and airspace opacities compatible with pulmonary edema, likely due to congestive heart failure. 2. Cardiomegaly. Electronically signed by: Waddell Calk MD 12/10/2024 05:08 AM EST RP Workstation: [id number]    EKG: I independently viewed the EKG done and my findings are as followed: Sinus tachycardia rate of 104.  QTc 469.  Assessment/Plan Present on Admission:  Acute on chronic systolic (congestive) heart failure (HCC)  Principal Problem:   Acute on chronic systolic (congestive) heart failure (HCC)  Hypertensive emergency with flash pulmonary edema Per the patient, he was doing well yesterday and had sudden onset shortness of breath with severe hypoxia this  morning. Initial blood pressure 250/150 Denies noncompliance to home oral antihypertensives Received IV hydralazine  and IV Lasix  in the ER Currently on nitro drip, with improvement of blood pressures. Resume home oral antihypertensives when no longer on BiPAP.  Acute on chronic HFrEF suspect secondary to the above Last limited transthoracic echocardiogram done on 11/26/2024 revealed LVEF 30 to 35% with left ventricle demonstrating regional wall motion abnormalities.  Moderate asymmetric left ventricular hypertrophy of the septal segment. Presented with pro BNP greater than 4900, cardiomegaly and pulmonary edema seen on chest x-ray, and trace bilateral lower extremity edema. Continue  BiPAP and diuresing Strict I's and O's and daily weight  Acute hypoxic respiratory failure secondary to pulmonary edema Not on oxygen  supplementation at baseline Severely hypoxic on presentation with O2 saturation in the 60s Currently on BiPAP Continue to treat underlying conditions Follow ABG Wean off O2 supplementation as tolerated. Maintain O2 saturation above 92%  Elevated troponin, suspect demand ischemia in the setting of severe hypoxia Initial high-sensitivity troponin 145, 170.  No evidence of acute ischemia on twelve-lead EKG Denies having any anginal symptoms. Trend high-sensitivity troponin until it is peaked.  Peripheral artery disease History of prior CVA Resume home aspirin  and Lipitor  when no longer NPO  CKD 4 Appears to be at his baseline creatinine 4.0 with GFR of 16. Monitor urine output Avoid nephrotoxic agents and hypotension. Repeat BMP  Type 2 diabetes with hyperglycemia Last hemoglobin A1c 5.8 on 10/31/2024 Presented with serum glucose of 201 Strict n.p.o. while on BiPAP. Sensitive insulin  sliding scale every 4 hours while NPO.  Anemia of chronic disease in the setting of advanced CKD Hemoglobin stable at 12.7, MCV 100.8. Continue to monitor  GERD Resume home PPI when  no longer NPO.  Chronic anxiety/depression Resume home Lexapro .   Critical care time: 55 minutes.   DVT prophylaxis: Subcu heparin  3 times daily.  Code Status: Full code.  Family Communication: None at bedside.  Disposition Plan: Admitted to stepdown unit.  Consults called: None.  Admission status: Inpatient status.   Status is: Inpatient The patient requires at least 2 midnights for further evaluation and treatment of present condition.   Terry LOISE Hurst MD [name] Pager [phone]  If 7PM-7AM, please contact night-coverage [email or web address] Password [email or web address]  12/10/2024, 6:04 AM      [1]  Allergies Allergen Reactions   Ibuprofen  Other (See Comments)    Avoid per PCP   Oxcarbazepine Other (See Comments)    Patient goes out of right state of mind.    "

## 2024-12-10 NOTE — ED Notes (Signed)
Respiratory contacted for transport

## 2024-12-10 NOTE — TOC Initial Note (Signed)
 Transition of Care [facility]) - Initial/Assessment Note    Patient Details  Name: Taylor Obrien MRN: [id number] Date of Birth: 1966/09/05  Transition of [facility]) CM/SW Contact:    Lucie Lunger, LCSWA Phone Number: 12/10/2024, 12:44 PM  Clinical Narrative:                 Pt is high risk for readmission. CSW spoke with pts daughter to complete assessment. Pts children live in the home with him. Pt has help with assistance with ADLs. Family provides transportation. Pt has a wheelchair in the home. Pt has a care giver for 40 hours a week. Pt has had HH in the past but not currently. TOC to follow.   Expected Discharge Plan: Home/Self Care Barriers to Discharge: Continued Medical Work up   Patient Goals and CMS Choice Patient states their goals for this hospitalization and ongoing recovery are:: return home CMS Medicare.gov Compare Post Acute Care list provided to:: Patient Represenative (must comment) Choice offered to / list presented to : Adult Children      Expected Discharge Plan and Services In-house Referral: Clinical Social Work Discharge Planning Services: CM Consult   Living arrangements for the past 2 months: Single Family Home                                      Prior Living Arrangements/Services Living arrangements for the past 2 months: Single Family Home Lives with:: Adult Children Patient language and need for interpreter reviewed:: Yes Do you feel safe going back to the place where you live?: Yes      Need for Family Participation in Patient Care: Yes (Comment) Care giver support system in place?: Yes (comment) Current home services: DME (wheelchair; 40hr wk home aide) Criminal Activity/Legal Involvement Pertinent to Current Situation/Hospitalization: No - Comment as needed  Activities of Daily Living   ADL Screening (condition at time of admission) Independently performs ADLs?: Yes (appropriate for developmental age) Is the patient deaf or have  difficulty hearing?: No Does the patient have difficulty seeing, even when wearing glasses/contacts?: No Does the patient have difficulty concentrating, remembering, or making decisions?: No  Permission Sought/Granted                  Emotional Assessment Appearance:: Appears stated age       Alcohol / Substance Use: Not Applicable Psych Involvement: No (comment)  Admission diagnosis:  Acute on chronic combined systolic and diastolic congestive heart failure (HCC) [I50.43] Hypertensive emergency [I16.1] Acute on chronic systolic (congestive) heart failure (HCC) [I50.23] Patient Active Problem List   Diagnosis Date Noted   Acute on chronic systolic (congestive) heart failure (HCC) 12/10/2024   Hypocalcemia 12/02/2024   CKD (chronic kidney disease) stage 4, GFR 15-29 ml/min (HCC) 11/27/2024   NSTEMI (non-ST elevated myocardial infarction) (HCC) 11/27/2024   Acute on chronic heart failure with reduced ejection fraction (HFrEF, <= 40%) (HCC) 11/27/2024   Elevated troponin 11/27/2024   Acute hypoxemic respiratory failure (HCC) 11/25/2024   Acute combined systolic and diastolic CHF, NYHA class 3 (HCC) 11/01/2024   Hypertensive emergency 10/31/2024   Suicidal ideation 04/21/2024   MDD (major depressive disorder), recurrent severe, 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   Intentional diphenhydramine  overdose (HCC) 04/19/2024   Pancreatic duct dilated 01/31/2024   Hypertensive urgency 01/30/2024   Chronic kidney disease, stage 3b (HCC) 01/30/2024   Type 2 diabetes mellitus with hyperglycemia (HCC)  01/30/2024   History of right ACA stroke 01/30/2024   Anxiety 01/30/2024   Fall at home, initial encounter 06/18/2023   AKI (acute kidney injury) 06/18/2023   Acute pancreatitis 07/19/2022   GERD (gastroesophageal reflux disease)    Iron deficiency anemia    Constipation    Abdominal pain 06/14/2022   Nausea and vomiting 06/14/2022   Elevated lipase 06/14/2022   Hypoalbuminemia due to  protein-calorie malnutrition 06/14/2022   Stage 3a chronic kidney disease (CKD) (HCC) 06/04/2022   Anemia    Acute pancreatitis without infection or necrosis    Transaminasemia 04/29/2022   Class 1 obesity 04/29/2022   Thrombocytosis 04/26/2022   Acute alcoholic pancreatitis 04/26/2022   Diabetes mellitus (HCC) 04/24/2022   Post-operative state 01/24/2018   Spastic hemiplegia affecting nondominant side (HCC) 06/03/2016   Dysuria    OSA (obstructive sleep apnea)    Hypokalemia    Elevated blood pressure    Hemiparesis affecting left side as late effect of stroke (HCC)    Epistaxis    HTN (hypertension) 11/27/2015   Acute renal failure superimposed on stage 3a chronic kidney disease (HCC) 11/27/2015   Hemiplegia and hemiparesis following unspecified cerebrovascular disease affecting left non-dominant side (HCC) 11/23/2015   Gait disturbance, post-stroke 11/23/2015   Cerebrovascular accident (CVA) due to occlusion of right anterior cerebral artery (HCC)    Essential hypertension    Depression    Chronic pain syndrome    ETOH abuse    Marijuana abuse    Cerebrovascular accident (CVA) due to thrombosis of right carotid artery (HCC)    Malignant hypertension    Mixed hyperlipidemia    Cerebral infarction (HCC) 11/18/2015   Stroke (cerebrum) (HCC) 11/18/2015   Chest pain 10/09/2012   Chronic back pain 10/09/2012   Tobacco abuse 10/09/2012   Hypertensive heart disease 08/31/2012   Accelerated hypertension 08/31/2012   Precordial pain 08/31/2012   PCP:  Trudy Ave, NP Pharmacy:   [facility] [place], [place] - [address] [place] [address] Phone: [phone] Fax: [phone]     Social Drivers of Health (SDOH) Social History: SDOH Screenings   Food Insecurity: Food Insecurity Present (12/10/2024)  Housing: Low Risk (12/10/2024)  Transportation Needs: No Transportation Needs (12/10/2024)  Utilities: At Risk (12/10/2024)  Alcohol  Screen: High Risk (04/21/2024)  Social Connections: Unknown (12/10/2024)  Tobacco Use: High Risk (12/10/2024)   SDOH Interventions:     Readmission Risk Interventions    12/10/2024   12:43 PM 11/26/2024   11:27 AM 04/26/2022   11:38 AM  Readmission Risk Prevention Plan  Transportation Screening Complete Complete Complete  HRI or Home Care Consult   Complete  Social Work Consult for Recovery Care Planning/Counseling   Complete  Palliative Care Screening   Not Applicable  Medication Review Oceanographer) Complete Complete Complete  PCP or Specialist appointment within 3-5 days of discharge  Complete   HRI or Home Care Consult Complete Complete   SW Recovery Care/Counseling Consult Complete Complete   Palliative Care Screening Not Applicable Not Applicable   Skilled Nursing Facility Not Applicable Not Applicable

## 2024-12-10 NOTE — ED Notes (Signed)
 Trop 145 critical called

## 2024-12-10 NOTE — Progress Notes (Signed)
 [name] PROGRESS NOTE  Taylor Obrien (DOB: 11-04-1966) FMW:[id number] PCP: Trudy Ave, NP  Brief Narrative: Taylor Obrien is a 59 year old male with HFrEF (EF 30-35%), CKD4 (Cr ~4.0), prior CVA, PAD, and T2DM presented with his third hypertensive emergency with flash pulmonary edema in 6 weeks (prior admissions 12/18-12/22 and 12/25-12/26). He awoke from sleep with acute severe dyspnea; EMS found him in respiratory distress with SpO? 64% on RA and BP 250/150, requiring CPAP and SL nitroglycerin .  Recent admissions include 12/18-12/22 for hypertensive emergency, CHF exacerbation, and NSTEMI felt to be demand ischemia (peak troponin 1,368), treated with 48h heparin  and discharged on ASA/Plavix /statin; and 12/26 for severe electrolyte derangements in the setting of emesis and high-dose diuretics, prompting reduction in torsemide  dose (40 ? 20 mg BID).   On arrival, BP 172/134, HR 107, RR 29 with severe respiratory distress requiring BiPAP. Exam notable for diffuse rales and trace edema. CXR showed cardiomegaly with diffuse pulmonary edema. Labs notable for proBNP 4,920, hs-troponin 145?170 (felt to be demand), Cr 4.06 (baseline), and glucose 201.  He was treated with IV furosemide  80 mg, IV morphine , IV hydralazine , and started on a nitroglycerin  infusion with improvement in oxygenation and systolic BP, though diastolic hypertension persisted. Admitted to stepdown for acute on chronic systolic/diastolic HF exacerbation due to recurrent hypertensive emergency, requiring diuresis, BP control, and noninvasive ventilatory support. Recurrent decompensation despite recent admissions raised concern for inadequate diuresis/BP control after discharge.  Subjective: Seen having just arrived to ICU, still on BiPAP. He denies chest pain, but still short of breath, feels about the same as when he got here, though hemodynamic parameters improved. Does not report headache.   I called his pharmacy. He  hasn't picked up any medications that were prescribed on discharge 12/26.  Objective: BP (!) 168/120   Pulse 93   Temp (!) 96.6 F (35.9 C) (Axillary)   Resp 16   Ht 5' 6 (1.676 m)   Wt 72.4 kg   SpO2 100%   BMI 25.76 kg/m   Gen: 58yo M in no acute distress resting quietly on BiPAP Pulm: Nonlabored but tachypneic in low 20's/min on my exam, shallow. Bilateral crackles without wheezes dependently.   CV: RRR, no MRG.  GI: Soft, NT, ND, +BS Neuro: Alert and oriented. No new focal deficits. Ext: [name] asymmetrically edematous, negative Homan's.  Skin: No rashes, lesions or ulcers on visualized skin   VBG: pH 7.32 / pCO2 52 / bicarbonate 27.1  @9am   Assessment & Plan: [name] edema:  - Venous U/S ordered. On heparin  5,000u [place] q8h for VTE ppx.   Recurrent hypertensive emergency with flash pulmonary edema:  - Pharmacy confirms patient has not picked up medications, so suspect nonadherence. Consult TOC. - Continue nitroglycerin  infusion targeting SBP <140 initially, then <130 over 24-48h; avoid SBP <100-120 - Transition to oral regimen once off BiPAP; resume carvedilol  and BiDil  when tolerating PO.  - Consider resistant HTN evaluation if persists despite confirmed adherence.   Acute on chronic HFrEF (EF 30-35%):  - Continue IV diuresis (furosemide  80mg  IV given at ~4am, will get 40mg  IV ~10am). Monitor strict I/O and daily weights - Resume GDMT when stable (carvedilol , BiDil , ASA, statin). Unable to initiate ACE/ARB/ARNI due to advanced CKD. Consider SGLT2i.  Acute hypoxic respiratory failure: - Continue BiPAP for now to help reduce LV preload/afterload, wean as tolerated; goal SpO? >92%.  Troponin elevation: Suspect demand ischemia (Troponin considerably lower than prior admit). No chest pain or ischemic ECG changes. - Trend  troponin to peak - NTG as above - On ASA, high-intensity statin.   CKD Stage 4 (Cr near baseline of ~4.0) - Monitor renal function and urine output during  diuresis - Avoid nephrotoxins and hypotension - Nephrology consult gif Cr rising or unable to diurese. Otherwise outpatient follow up planned.    Taylor KATHEE Come, MD [name] [email or web address] 12/10/2024, 9:54 AM

## 2024-12-10 NOTE — Progress Notes (Signed)
" °   12/10/24 0357  BiPAP/CPAP/SIPAP  $ Non-Invasive Ventilator  Non-Invasive Vent Initial  $ Face Mask Medium Yes  BiPAP/CPAP/SIPAP Pt Type Adult  BiPAP/CPAP/SIPAP SERVO  Mask Type Full face mask  Dentures removed? Not applicable  Mask Size Medium  Set Rate 15 breaths/min  Respiratory Rate 29 breaths/min  IPAP 14 cmH20  EPAP 7 cmH2O  Pressure Support 7 cmH20  PEEP 7 cmH20  FiO2 (%) 100 %  Minute Ventilation 16.1  Leak 74  Peak Inspiratory Pressure (PIP) 15  Tidal Volume (Vt) 501  Patient Home Machine No  Patient Home Mask No  Patient Home Tubing No  Auto Titrate No  Press High Alarm 25 cmH2O  Press Low Alarm 2 cmH2O  Device Plugged into RED Power Outlet Yes  BiPAP/CPAP /SiPAP Vitals  Pulse Rate (!) 110  Resp (!) 29  BP (!) 235/144  SpO2 93 %  Bilateral Breath Sounds Coarse crackles   Patient taken off EMS CPAP and placed on BIPAP per MD. "

## 2024-12-10 NOTE — Progress Notes (Signed)
 BIPAP on standby and not needed at this time.

## 2024-12-11 DIAGNOSIS — I5023 Acute on chronic systolic (congestive) heart failure: Secondary | ICD-10-CM | POA: Diagnosis not present

## 2024-12-11 LAB — BASIC METABOLIC PANEL WITH GFR
Anion gap: 12 (ref 5–15)
BUN: 40 mg/dL — ABNORMAL HIGH (ref 6–20)
CO2: 25 mmol/L (ref 22–32)
Calcium: 8.8 mg/dL — ABNORMAL LOW (ref 8.9–10.3)
Chloride: 102 mmol/L (ref 98–111)
Creatinine, Ser: 4.56 mg/dL — ABNORMAL HIGH (ref 0.61–1.24)
GFR, Estimated: 14 mL/min — ABNORMAL LOW
Glucose, Bld: 95 mg/dL (ref 70–99)
Potassium: 4.1 mmol/L (ref 3.5–5.1)
Sodium: 139 mmol/L (ref 135–145)

## 2024-12-11 LAB — GLUCOSE, CAPILLARY
Glucose-Capillary: 106 mg/dL — ABNORMAL HIGH (ref 70–99)
Glucose-Capillary: 132 mg/dL — ABNORMAL HIGH (ref 70–99)
Glucose-Capillary: 143 mg/dL — ABNORMAL HIGH (ref 70–99)
Glucose-Capillary: 146 mg/dL — ABNORMAL HIGH (ref 70–99)
Glucose-Capillary: 88 mg/dL (ref 70–99)
Glucose-Capillary: 99 mg/dL (ref 70–99)

## 2024-12-11 MED ORDER — FUROSEMIDE 10 MG/ML IJ SOLN
80.0000 mg | Freq: Once | INTRAMUSCULAR | Status: AC
  Administered 2024-12-11: 80 mg via INTRAVENOUS
  Filled 2024-12-11: qty 8

## 2024-12-11 MED ORDER — INSULIN ASPART 100 UNIT/ML IJ SOLN
0.0000 [IU] | Freq: Three times a day (TID) | INTRAMUSCULAR | Status: DC
  Administered 2024-12-12: 1 [IU] via SUBCUTANEOUS
  Filled 2024-12-11: qty 1

## 2024-12-11 NOTE — Plan of Care (Signed)
Problem: Education: Goal: Knowledge of General Education information will improve Description Including pain rating scale, medication(s)/side effects and non-pharmacologic comfort measures Outcome: Progressing   Problem: Coping: Goal: Level of anxiety will decrease Outcome: Progressing   Problem: Safety: Goal: Ability to remain free from injury will improve Outcome: Progressing   Problem: Coping: Goal: Ability to adjust to condition or change in health will improve Outcome: Progressing

## 2024-12-11 NOTE — Plan of Care (Signed)
" °  Problem: Education: Goal: Knowledge of General Education information will improve Description: Including pain rating scale, medication(s)/side effects and non-pharmacologic comfort measures Outcome: Progressing   Problem: Clinical Measurements: Goal: Diagnostic test results will improve Outcome: Progressing   Problem: Clinical Measurements: Goal: Respiratory complications will improve Outcome: Progressing   Problem: Pain Managment: Goal: General experience of comfort will improve and/or be controlled Outcome: Progressing   "

## 2024-12-11 NOTE — Progress Notes (Signed)
 [name] PROGRESS NOTE  Taylor BONENBERGER (DOB: March 16, 1966) FMW:[id number] PCP: Trudy Ave, NP  Brief Narrative: Taylor Obrien is a 59 year old male with HFrEF (EF 30-35%), CKD4 (Cr ~4.0), prior CVA, PAD, and T2DM presented with his third hypertensive emergency with flash pulmonary edema in 6 weeks (prior admissions 12/18-12/22 and 12/25-12/26). He awoke from sleep with acute severe dyspnea; EMS found him in respiratory distress with SpO? 64% on RA and BP 250/150, requiring CPAP and SL nitroglycerin .  Recent admissions include 12/18-12/22 for hypertensive emergency, CHF exacerbation, and NSTEMI felt to be demand ischemia (peak troponin 1,368), treated with 48h heparin  and discharged on ASA/Plavix /statin; and 12/26 for severe electrolyte derangements in the setting of emesis and high-dose diuretics, prompting reduction in torsemide  dose (40 ? 20 mg BID).   On arrival, BP 172/134, HR 107, RR 29 with severe respiratory distress requiring BiPAP. Exam notable for diffuse rales and trace edema. CXR showed cardiomegaly with diffuse pulmonary edema. Labs notable for proBNP 4,920, hs-troponin 145?170 (felt to be demand), Cr 4.06 (baseline), and glucose 201.  He was treated with IV furosemide  80 mg, IV morphine , IV hydralazine , and started on a nitroglycerin  infusion with improvement in oxygenation and systolic BP, though diastolic hypertension persisted. Admitted to stepdown for acute on chronic systolic/diastolic HF exacerbation due to recurrent hypertensive emergency. With reinitiation of home BP medications and IV diuresis, blood pressure control improved, respiratory status has also improved, though he remains newly hypoxemic. Stable for transfer SDU > medical floor 1/3.   Subjective: Says he feels mentally foggy but denies chest pain. Still having dyspnea. Ate all of breakfast. Wheelchair-bound for a long time now. Says he doesn't want to go home right now because no one can take care of him.    Objective: BP (!) 146/75   Pulse 73   Temp 98.3 F (36.8 C) (Oral)   Resp 15   Ht 5' 6 (1.676 m)   Wt 71.3 kg   SpO2 96%   BMI 25.37 kg/m   Gen: 58yo M in no distress resting quietly Pulm: Nonlabored, low 90%'s at rest with supplemental oxygen , bibasilar crackles CV: RRR, no MRG GI: Soft, NT, ND, +BS Neuro: Alert and oriented. No new focal deficits. Ext: Warm, no deformities. [name] with asymmetric edema that is improved Skin: No new rashes, lesions or ulcers on visualized skin   Assessment & Plan: Recurrent hypertensive emergency with flash pulmonary edema:  - Weaned from NTG infusion, now on bidil  40-75mg  TID, losartan  50mg  daily, coreg  25mg  BID. Still planning to avoid SBP < 100-120.  - If BP trends uncontrolled, could start DHP CCB, e.g. norvasc .  - Consider resistant HTN evaluation if persists despite confirmed adherence.   Acute on chronic HFrEF (EF 30-35%):  - Continue IV diuresis again today given his persistent hypoxemia. Monitor strict I/O and daily weights - Resumed GDMT (carvedilol , BiDil , ASA, statin, added ARB per cardiology).   Acute hypoxic respiratory failure: - Continues to have crackles and hypoxemia, suspect still need diuresis. Continue supplemental oxygen  to maintain SpO2 >90% and normal WOB. Stable, though, to transfer to floor  Troponin elevation: Suspect demand ischemia (Troponin considerably lower than prior admit). No chest pain or ischemic ECG changes. Troponin has peaked.  - On ASA, high-intensity statin, beta blocker - Would benefit from [facility] to investigate cardiomyopathy, though this is not urgent and renal insufficiency makes this high risk.   CKD Stage 4 (Cr near baseline of ~4.0) - Monitor renal function and urine output during diuresis.  Need to avoid abrupt lowering of BP.  - Avoid nephrotoxins and hypotension - Nephrology consult if Cr rising or unable to diurese. Otherwise outpatient follow up planned.    [name] edema:  - Venous U/S  negative for DVT. Continue heparin  5,000u New  q8h for VTE ppx.  Well-controlled T2DM:  - Continue SSI AC/HS  Debility, history of CVA:  - PT/OT evaluation.    Taylor Obrien, Taylor Obrien [name] [email or web address] 12/11/2024, 4:55 PM

## 2024-12-12 DIAGNOSIS — I5023 Acute on chronic systolic (congestive) heart failure: Secondary | ICD-10-CM | POA: Diagnosis not present

## 2024-12-12 LAB — BASIC METABOLIC PANEL WITH GFR
Anion gap: 10 (ref 5–15)
BUN: 43 mg/dL — ABNORMAL HIGH (ref 6–20)
CO2: 28 mmol/L (ref 22–32)
Calcium: 8.9 mg/dL (ref 8.9–10.3)
Chloride: 101 mmol/L (ref 98–111)
Creatinine, Ser: 4.77 mg/dL — ABNORMAL HIGH (ref 0.61–1.24)
GFR, Estimated: 14 mL/min — ABNORMAL LOW
Glucose, Bld: 123 mg/dL — ABNORMAL HIGH (ref 70–99)
Potassium: 3.9 mmol/L (ref 3.5–5.1)
Sodium: 139 mmol/L (ref 135–145)

## 2024-12-12 LAB — GLUCOSE, CAPILLARY
Glucose-Capillary: 136 mg/dL — ABNORMAL HIGH (ref 70–99)
Glucose-Capillary: 115 mg/dL — ABNORMAL HIGH (ref 70–99)

## 2024-12-12 MED ORDER — ISOSORB DINITRATE-HYDRALAZINE 20-37.5 MG PO TABS
2.0000 | ORAL_TABLET | Freq: Three times a day (TID) | ORAL | 0 refills | Status: DC
  Filled 2025-01-03: qty 180, 30d supply, fill #0

## 2024-12-12 MED ORDER — TORSEMIDE 20 MG PO TABS: 20.0000 mg | ORAL_TABLET | Freq: Two times a day (BID) | ORAL | 0 refills | Status: DC

## 2024-12-12 MED ORDER — CARVEDILOL 25 MG PO TABS
25.0000 mg | ORAL_TABLET | Freq: Two times a day (BID) | ORAL | 0 refills | Status: DC
  Filled 2025-01-03: qty 60, 30d supply, fill #0

## 2024-12-12 MED ORDER — LOSARTAN POTASSIUM 50 MG PO TABS: 50.0000 mg | ORAL_TABLET | Freq: Every day | ORAL | 0 refills | Status: DC

## 2024-12-12 NOTE — TOC Transition Note (Signed)
 Transition of Care [facility]) - Discharge Note   Patient Details  Name: Taylor Obrien MRN: [id number] Date of Birth: [birth year]  [facility]) CM/SW Contact:  Ronnald MARLA Sil, RN Phone Number: 12/12/2024, 4:55 PM   Clinical Narrative:    PT [place] contacted CM via SecureChat indicating: I went and talked to the patient, he states he is at baseline and does not need HHPT  CM followed up with patient at bedside as staff was preparing him for discharge.  The patient confirmed he has a manual wheelchair at home and that's all I need, confirmed he felt he is at his baseline functionality, denied any additional needs.  Final next level of care: Home/Self Care Barriers to Discharge: No Barriers Identified   Patient Goals and CMS Choice Patient states their goals for this hospitalization and ongoing recovery are:: Return Home with [facility] [employer] [facility] CMS Medicare.gov Compare Post Acute Care list provided to:: Patient Represenative (must comment) Choice offered to / list presented to : Adult Children  Discharge Placement   Discharge Plan and Services Additional resources added to the After Visit Summary for   In-house Referral: Clinical Social Work Discharge Planning Services: CM Consult     DME Arranged: N/A DME Agency: NA HH Arranged: Refused HH  Social Drivers of Health (SDOH) Interventions SDOH Screenings   Food Insecurity: Food Insecurity Present (12/10/2024)  Housing: Low Risk (12/10/2024)  Transportation Needs: No Transportation Needs (12/10/2024)  Utilities: At Risk (12/10/2024)  Alcohol Screen: High Risk (04/21/2024)  Social Connections: Unknown (12/10/2024)  Tobacco Use: High Risk (12/10/2024)    Readmission Risk Interventions    12/10/2024   12:43 PM 11/26/2024   11:27 AM 04/26/2022   11:38 AM  Readmission Risk Prevention Plan  Transportation Screening Complete Complete Complete  HRI or Home Care Consult   Complete  Social Work Consult for Recovery Care Planning/Counseling    Complete  Palliative Care Screening   Not Applicable  Medication Review Oceanographer) Complete Complete Complete  PCP or Specialist appointment within 3-5 days of discharge  Complete   HRI or Home Care Consult Complete Complete   SW Recovery Care/Counseling Consult Complete Complete   Palliative Care Screening Not Applicable Not Applicable   Skilled Nursing Facility Not Applicable Not Applicable

## 2024-12-12 NOTE — Discharge Summary (Signed)
 " Physician Discharge Summary   Patient: Taylor Obrien MRN: [id number] DOB: February 18, 1966  Admit date:     12/10/2024  Discharge date: 12/12/2024   Discharge Physician: Bernardino KATHEE Come   PCP: Trudy Ave, NP   Recommendations at discharge:  {Tip this will not be part of the note when signed- Example include specific recommendations for outpatient follow-up, pending tests to follow-up on. (Optional):26781}  ***  Discharge Diagnoses: Principal Problem:   Acute on chronic systolic (congestive) heart failure [facility])  Hospital Course: Taylor Obrien is a 59 year old male with HFrEF (EF 30-35%), CKD4 (Cr ~4.0), prior CVA, PAD, and T2DM presented with his third hypertensive emergency with flash pulmonary edema in 6 weeks (prior admissions 12/18-12/22 and 12/25-12/26). He awoke from sleep with acute severe dyspnea; EMS found him in respiratory distress with SpO? 64% on RA and BP 250/150, requiring CPAP and SL nitroglycerin .  Recent admissions include 12/18-12/22 for hypertensive emergency, CHF exacerbation, and NSTEMI felt to be demand ischemia (peak troponin 1,368), treated with 48h heparin  and discharged on ASA/Plavix /statin; and 12/26 for severe electrolyte derangements in the setting of emesis and high-dose diuretics, prompting reduction in torsemide  dose (40 ? 20 mg BID).   On arrival, BP 172/134, HR 107, RR 29 with severe respiratory distress requiring BiPAP. Exam notable for diffuse rales and trace edema. CXR showed cardiomegaly with diffuse pulmonary edema. Labs notable for proBNP 4,920, hs-troponin 145?170 (felt to be demand), Cr 4.06 (baseline), and glucose 201.  He was treated with IV furosemide  80 mg, IV morphine , IV hydralazine , and started on a nitroglycerin  infusion with improvement in oxygenation and systolic BP, though diastolic hypertension persisted. Admitted to stepdown for acute on chronic systolic/diastolic HF exacerbation due to recurrent hypertensive emergency. With reinitiation of  home BP medications and IV diuresis, blood pressure control improved, respiratory status has also improved, ultimately liberated from oxygen  and feeling very well with normal blood pressure on 1/4.    Assessment and Plan: Recurrent hypertensive emergency with flash pulmonary edema:  - Continue bidil  40-75mg  TID, losartan  50mg  daily, coreg  25mg  BID. Pt has not picked up prescriptions from last discharge, will send new Rx in based on current medication recommendations.  - Consider resistant HTN evaluation if persists despite confirmed adherence.   Acute on chronic HFrEF (EF 30-35%):  - Continue IV diuresis again today given his persistent hypoxemia. Monitor strict I/O and daily weights - Resumed GDMT (carvedilol , BiDil , ASA, statin, added ARB per cardiology).   Acute hypoxic respiratory failure: - Continues to have crackles and hypoxemia, both now resolved. Suspect with improved blood pressure, does not require standing diuretic***.   Troponin elevation: Suspect demand ischemia (Troponin considerably lower than prior admit). No chest pain or ischemic ECG changes. Troponin has peaked.  - On ASA, high-intensity statin, beta blocker - Would benefit from [facility] to investigate cardiomyopathy, though this is not urgent and renal insufficiency makes this high risk.   CKD Stage 4 (Cr near baseline of ~4.0 [GFR 16], Cr now roughly stable at 4.77 [GFR 15]) - Monitor renal function after discharge, follow up with nephrology   [name] edema:  - Venous U/S negative for DVT.  Improved.   Well-controlled T2DM:  - Continue home Tx   Debility, history of CVA:  - Reports he's back to his baseline and does not want physical therapy evaluation  Consultants: *** Procedures performed: ***  Disposition: {Plan; Disposition:26390} Diet recommendation:  {Diet_Plan:26776} DISCHARGE MEDICATION: Allergies as of 12/12/2024       Reactions  Ibuprofen  Other (See Comments)   Avoid per PCP   Oxcarbazepine Other (See  Comments)   Patient goes out of right state of mind.      Med Rec must be completed prior to using this [facility]***       Discharge Exam: Filed Weights   12/10/24 0817 12/11/24 0500 12/12/24 0605  Weight: 72.4 kg 71.3 kg 70.2 kg   ***  Condition at discharge: {DC Condition:26389}  The results of significant diagnostics from this hospitalization (including imaging, microbiology, ancillary and laboratory) are listed below for reference.   Imaging Studies: US  Venous Img Lower Bilateral (DVT) Result Date: 12/10/2024 CLINICAL DATA:  BILATERAL lower extremity edema. Shortness of breath on admission. EXAM: Bilateral Lower Extremity Venous Doppler Ultrasound TECHNIQUE: Gray-scale sonography with compression, as well as color and duplex ultrasound, were performed to evaluate the deep venous system(s) from the level of the common femoral vein through the popliteal and proximal calf veins. COMPARISON:  01/17/2018 FINDINGS: VENOUS Normal compressibility of the common femoral, femoral, and popliteal veins, as well as the visualized calf veins. Visualized portions of profunda femoral vein and great saphenous vein unremarkable. No filling defects to suggest DVT on grayscale or color Doppler imaging. Doppler waveforms show normal direction of venous flow, normal respiratory plasticity and response to augmentation. OTHER None. Limitations: none IMPRESSION: No lower extremity DVT. Electronically Signed   By: Aliene Lloyd M.D.   On: 12/10/2024 16:10   DG Chest Port 1 View Result Date: 12/10/2024 EXAM: 1 VIEW(S) XRAY OF THE CHEST 12/10/2024 04:07:20 AM COMPARISON: 11/25/2024 CLINICAL HISTORY: shortness of breath FINDINGS: LUNGS AND PLEURA: Diffuse interstitial and airspace opacities. No pleural effusion. No pneumothorax. HEART AND MEDIASTINUM: Cardiomegaly. Atherosclerotic calcifications. BONES AND SOFT TISSUES: No acute osseous abnormality. CHEST WALL AND UPPER ABDOMEN: Gaseous distension of stomach.  IMPRESSION: 1. Diffuse interstitial and airspace opacities compatible with pulmonary edema, likely due to congestive heart failure. 2. Cardiomegaly. Electronically signed by: Waddell Calk MD 12/10/2024 05:08 AM EST RP Workstation: [id number]   ECHOCARDIOGRAM LIMITED Result Date: 11/26/2024    ECHOCARDIOGRAM LIMITED REPORT   Patient Name:   Taylor Obrien Date of Exam: 11/26/2024 Medical Rec #:  [id number]     Height:       66.0 in Accession #:    [id number]    Weight:       164.0 lb Date of Birth:  Feb 13, 1966     BSA:          1.838 m Patient Age:    58 years      BP:           163/110 mmHg Patient Gender: M             HR:           85 bpm. Exam Location:  Inpatient Procedure: Limited Echo, Cardiac Doppler, Color Doppler and Intracardiac            Opacification Agent (Both Spectral and Color Flow Doppler were            utilized during procedure). Indications:    122-I22.9 Subsequent ST elevation (STEM) and non-ST elevation                 (NSTEMI) myocardial infarction  History:        Patient has prior history of Echocardiogram examinations, most                 recent 10/31/2024. Stroke, Signs/Symptoms:Shortness of Breath,  Dyspnea, Chest Pain and Altered Mental Status; Risk                 Factors:Current Smoker, Sleep Apnea, Diabetes, Dyslipidemia and                 Hypertension. ETOH.  Sonographer:    Ellouise Mose RDCS Referring Phys: [id number] CAROLE N HALL  Sonographer Comments: Technically difficult study due to poor echo windows. IMPRESSIONS  1. Limited study.  2. Left ventricular ejection fraction, by estimation, is 30 to 35%. Left ventricular ejection fraction by 2D MOD biplane is 31.4 %. The left ventricle has moderately decreased function. The left ventricle demonstrates regional wall motion abnormalities (see scoring diagram/findings for description). There is moderate asymmetric left ventricular hypertrophy of the septal segment. Left ventricular diastolic function could not be  evaluated. The average left ventricular global longitudinal strain is -12.1 %. The global longitudinal strain is abnormal.  3. Right ventricular systolic function is normal. The right ventricular size is normal. Tricuspid regurgitation signal is inadequate for assessing PA pressure.  4. The mitral valve is grossly normal. Mild mitral valve regurgitation.  5. The aortic valve was not well visualized.  6. The inferior vena cava is normal in size with greater than 50% respiratory variability, suggesting right atrial pressure of 3 mmHg. Comparison(s): Prior images reviewed side by side. LVEF 30-35% range at this point, similar wall motion abnormalties were present on the prior study as well. Mild mitral regurgitation. FINDINGS  Left Ventricle: Left ventricular ejection fraction, by estimation, is 30 to 35%. Left ventricular ejection fraction by 2D MOD biplane is 31.4 %. The left ventricle has moderately decreased function. The left ventricle demonstrates regional wall motion abnormalities. Definity  contrast agent was given IV to delineate the left ventricular endocardial borders. The average left ventricular global longitudinal strain is -12.1 %. Strain was performed and the global longitudinal strain is abnormal. The left ventricular internal cavity size was normal in size. There is moderate asymmetric left ventricular hypertrophy of the septal segment. Left ventricular diastolic function could not be evaluated.  LV Wall Scoring: The basal inferolateral segment, basal anterolateral segment, and basal inferior segment are akinetic. The entire anterior wall, mid and distal lateral wall, entire septum, entire apex, mid and distal inferior wall, and mid anterolateral segment are hypokinetic. Right Ventricle: The right ventricular size is normal. No increase in right ventricular wall thickness. Right ventricular systolic function is normal. Tricuspid regurgitation signal is inadequate for assessing PA pressure. Pericardium:  There is no evidence of pericardial effusion. Mitral Valve: The mitral valve is grossly normal. Mild mitral valve regurgitation. Tricuspid Valve: The tricuspid valve is grossly normal. Tricuspid valve regurgitation is trivial. No evidence of tricuspid stenosis. Aortic Valve: The aortic valve was not well visualized. Aorta: The aortic root and ascending aorta are structurally normal, with no evidence of dilitation. Venous: The inferior vena cava is normal in size with greater than 50% respiratory variability, suggesting right atrial pressure of 3 mmHg. Additional Comments: Spectral Doppler performed. Color Doppler performed.  LEFT VENTRICLE PLAX 2D                        Biplane EF (MOD) LVIDd:         4.80 cm         LV Biplane EF:   Left LVIDs:         4.30 cm  ventricular LV PW:         1.40 cm                          ejection LV IVS:        1.70 cm                          fraction by                                                 2D MOD                                                 biplane is LV Volumes (MOD)                                31.4 %. LV vol d, MOD    119.0 ml A2C: LV vol d, MOD    95.7 ml       2D Longitudinal A4C:                           Strain LV vol s, MOD    77.0 ml       2D Strain GLS   -12.1 % A2C:                           Avg: LV vol s, MOD    66.2 ml A4C: LV SV MOD A2C:   42.0 ml LV SV MOD A4C:   95.7 ml LV SV MOD BP:    34.2 ml IVC IVC diam: 0.70 cm AORTIC VALVE LVOT Vmax:   106.00 cm/s LVOT Vmean:  64.500 cm/s LVOT VTI:    0.148 m  AORTA Ao Asc diam: 2.90 cm MITRAL VALVE MV Area (PHT): 4.49 cm     SHUNTS MV Decel Time: 169 msec     Systemic VTI: 0.15 m MV E velocity: 49.30 cm/s MV A velocity: 103.00 cm/s MV E/A ratio:  0.48 Jayson Sierras MD Electronically signed by Jayson Sierras MD Signature Date/Time: 11/26/2024/1:45:30 PM    Final    DG Chest Port 1 View Result Date: 11/25/2024 EXAM: 1 VIEW(S) XRAY OF THE CHEST 11/25/2024 07:32:09 PM COMPARISON:  11/15/2024 CLINICAL HISTORY: SOB FINDINGS: LUNGS AND PLEURA: Increased central vascular congestion with diffuse parenchymal opacity bilaterally consistent with edema. Small left pleural effusion is noted. No pneumothorax. HEART AND MEDIASTINUM: Cardiomegaly. Increased central vascular congestion. BONES AND SOFT TISSUES: No acute osseous abnormality. IMPRESSION: 1. Worsening congestive heart failure 2. Small left pleural effusion. Electronically signed by: Oneil Devonshire MD 11/25/2024 07:34 PM EST RP Workstation: [id number]   CT CERVICAL SPINE WO CONTRAST Result Date: 11/15/2024 EXAM: CT CERVICAL SPINE WITHOUT CONTRAST 11/15/2024 07:22:07 PM TECHNIQUE: CT of the cervical spine was performed without the administration of intravenous contrast. Multiplanar reformatted images are provided for review. Automated exposure control, iterative reconstruction, and/or weight based adjustment of the mA/kV was utilized to reduce the radiation dose to as low as reasonably achievable. COMPARISON: CT  spine 07/06/2018. CLINICAL HISTORY: Polytrauma, blunt. 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 No acute fracture or traumatic malalignment. DEGENERATIVE CHANGES: Multilevel mild-to-moderate degenerative changes of the spine. No associated severe osseous neural foraminal or central canal stenosis. SOFT TISSUES: No prevertebral soft tissue swelling. IMPRESSION: 1. No acute abnormality of the cervical spine. 2. 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 Electronically signed by: Morgane Naveau MD 11/15/2024 07:48 PM EST RP Workstation: [id number]   CT T-SPINE NO CHARGE Result Date: 11/15/2024 EXAM: CT THORACIC SPINE WITHOUT CONTRAST 11/15/2024 07:22:07 PM TECHNIQUE: CT of the thoracic spine was performed without the administration of intravenous contrast. Multiplanar reformatted images are provided for review.  Automated exposure control, iterative reconstruction, and/or weight based adjustment of the mA/kV was utilized to reduce the radiation dose to as low as reasonably achievable. COMPARISON: MRI lumbar spine 06/27/2024. CLINICAL HISTORY: FINDINGS: BONES AND ALIGNMENT: Normal vertebral body heights except for T12. Chronic stable T12 supra endplate mild compression fracture with associated Schmorl's node. No acute fracture or suspicious bone lesion. Normal alignment. DEGENERATIVE CHANGES: Multilevel mild-to-moderate anterior osteophyte formation. SOFT TISSUES: No acute abnormality. IMPRESSION: 1. No acute findings. 2. Chronic stable T12 superior endplate mild compression fracture. 3. Please see separately dictated CT , abdomen, pelvis, lumbar spine 11/15/2024. Electronically signed by: Morgane Naveau MD 11/15/2024 07:36 PM EST RP Workstation: [id number]   CT L-SPINE NO CHARGE Result Date: 11/15/2024 EXAM: CT OF THE LUMBAR SPINE WITHOUT CONTRAST 11/15/2024 07:22:07 PM TECHNIQUE: CT of the lumbar spine was performed without the administration of intravenous contrast. Multiplanar reformatted images are provided for review. Automated exposure control, iterative reconstruction, and/or weight based adjustment of the mA/kV was utilized to reduce the radiation dose to as low as reasonably achievable. COMPARISON: MRI lumbar spine 06/27/2024. CLINICAL HISTORY: Please see separately dictated CT thoracic spine 11/15/2024. FINDINGS: BONES AND ALIGNMENT: Normal vertebral body heights. No acute fracture or suspicious bone lesion. Normal alignment. DEGENERATIVE CHANGES: Mild anterior osteophyte formation. SOFT TISSUES: No acute abnormality. IMPRESSION: 1. No acute findings. 2. Please see separately dictated CT chest, abdomen, pelvis, thoracic spine 11/15/2024. Electronically signed by: Morgane Naveau MD 11/15/2024 07:35 PM EST RP Workstation: [id number]   CT CHEST ABDOMEN PELVIS WO CONTRAST Result Date: 11/15/2024 EXAM: CT CHEST,  ABDOMEN AND PELVIS WITHOUT CONTRAST 11/15/2024 07:22:07 PM TECHNIQUE: CT of the chest, abdomen and pelvis was performed without the administration of intravenous contrast. Multiplanar reformatted images are provided for review. Automated exposure control, iterative reconstruction, and/or weight based adjustment of the mA/kV was utilized to reduce the radiation dose to as low as reasonably achievable. COMPARISON: CT angio neurobiform 06/27/2024, CT abdomen and pelvis 04/29/2024. Please see separately dictated CT thoracolumbar spine 11/15/2024. CLINICAL HISTORY: Polytrauma, blunt. FINDINGS: CHEST: MEDIASTINUM AND LYMPH NODES: Heart and pericardium are unremarkable. The central airways are clear. No mediastinal, hilar or axillary lymphadenopathy.No gross hilar adenopathy with limited evaluation on this noncontrast study. 4-vessel coronary artery calcifications. LUNGS AND PLEURA: No focal consolidation or pulmonary edema. No pleural effusion or pneumothorax. ABDOMEN AND PELVIS: LIVER: The liver is unremarkable. GALLBLADDER AND BILE DUCTS: Gallbladder is unremarkable. No biliary ductal dilatation. SPLEEN: No acute abnormality. PANCREAS: No acute abnormality. ADRENAL GLANDS: No acute abnormality. KIDNEYS, URETERS AND BLADDER: No stones in the kidneys or ureters. No hydronephrosis. No perinephric or periureteral stranding. Urinary bladder is unremarkable. Calcifications associated with the kidneys are likely vascular. GI AND BOWEL: Stomach demonstrates no acute abnormality. There is no  bowel obstruction. No small or large bowel thickening or dilatation. The appendix is unremarkable. Colonic diverticulosis. Otherwise, the colon is unremarkable. REPRODUCTIVE ORGANS: No acute abnormality. PERITONEUM AND RETROPERITONEUM: No ascites. No free air. VASCULATURE: Aorta is normal in caliber. Severe atherosclerotic plaque of the abdominal aorta. Mild atherosclerotic plaque in the thoracic aorta. ABDOMINAL AND PELVIS LYMPH NODES: No  lymphadenopathy. BONES AND SOFT TISSUES: No acute displaced rib fracture. No acute displaced fracture or dislocation of either shoulder or hips. No acute displaced fracture or diastasis of the bones of the pelvis. No acute sacral fracture. No large soft tissue hematoma. Please see separately dictated CT thoracolumbar spine 12.8.25 . No large soft tissue hematoma No focal soft tissue abnormality. IMPRESSION: 1. No acute findings related to trauma . 2. Please see separately dictated CT thoracolumbar spine 12.8.25 . 3. Other, non-acute and/or normal findings as above. Electronically signed by: Morgane Naveau MD 11/15/2024 07:34 PM EST RP Workstation: [id number]   CT HEAD WO CONTRAST Result Date: 11/15/2024 EXAM: CT HEAD WITHOUT CONTRAST 11/15/2024 07:22:07 PM TECHNIQUE: CT of the head was performed without the administration of intravenous contrast. Automated exposure control, iterative reconstruction, and/or weight based adjustment of the mA/kV was utilized to reduce the radiation dose to as low as reasonably achievable. COMPARISON: None available. CLINICAL HISTORY: Head trauma, moderate-severe FINDINGS: BRAIN AND VENTRICLES: No acute hemorrhage. No evidence of acute infarct. 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 Chronic stable lacunar infarcts in left basal ganglia and pons. Patchy and confluent supratentorial white matter hypodensities, likely representing advanced chronic microvascular ischemic changes. No hydrocephalus. No extra-axial collection. No mass effect or midline shift. Atherosclerotic calcifications are present within the cavernous internal carotid arteries. ORBITS: No acute abnormality. SINUSES: No acute abnormality. SOFT TISSUES AND SKULL: No acute soft tissue abnormality. No skull fracture. IMPRESSION: 1. No acute intracranial abnormality related to head trauma. Electronically signed by: Morgane Naveau MD 11/15/2024 07:29 PM EST RP Workstation: [id number]   DG Pelvis  Portable Result Date: 11/15/2024 CLINICAL DATA:  Clemens out of wheelchair, lower back pain EXAM: PORTABLE PELVIS 1-2 VIEWS COMPARISON:  None Available. FINDINGS: Frontal view of the pelvis includes both hips. No acute displaced fracture, subluxation, or dislocation. Mild symmetrical bilateral hip osteoarthritis. Soft tissues are unremarkable. IMPRESSION: 1. Symmetrical bilateral hip osteoarthritis. 2. No acute fracture. Electronically Signed   By: Ozell Daring M.D.   On: 11/15/2024 18:10   DG Chest Port 1 View Result Date: 11/15/2024 CLINICAL DATA:  Clemens out of wheelchair, lower back pain EXAM: PORTABLE CHEST 1 VIEW COMPARISON:  10/30/2024 FINDINGS: The heart size and mediastinal contours are within normal limits. Both lungs are clear. The visualized skeletal structures are unremarkable. IMPRESSION: No active disease. Electronically Signed   By: Ozell Daring M.D.   On: 11/15/2024 18:09    Microbiology: Results for orders placed or performed during the hospital encounter of 12/10/24  MRSA Next Gen by PCR, Nasal     Status: None   Collection Time: 12/10/24  8:10 AM   Specimen: Nasal Mucosa; Nasal Swab  Result Value Ref Range Status   MRSA by PCR Next Gen NOT DETECTED NOT DETECTED Final    Comment: (NOTE) The GeneXpert MRSA Assay (FDA approved for NASAL specimens only), is one component of a comprehensive MRSA colonization surveillance program. It is not intended to diagnose MRSA infection nor to guide or monitor treatment for MRSA infections. Test performance is not FDA approved in patients less than 43 years old. Performed at [facility]  [facility], [address] [place], [place] [address]     Labs: CBC: Recent Labs  Lab 12/10/24 0345  WBC 10.5  NEUTROABS 6.9  HGB 12.7*  HCT 39.7  MCV 100.8*  PLT 345   Basic Metabolic Panel: Recent Labs  Lab 12/10/24 0345 12/11/24 0445 12/12/24 0546  NA 137 139 139  K 4.9 4.1 3.9  CL 102 102 101  CO2 22 25 28   GLUCOSE 201* 95 123*  BUN 33* 40*  43*  CREATININE 4.06* 4.56* 4.77*  CALCIUM  9.0 8.8* 8.9   Liver Function Tests: Recent Labs  Lab 12/10/24 0345  AST 28  ALT 9  ALKPHOS 54  BILITOT 0.2  PROT 7.4  ALBUMIN 3.8   CBG: Recent Labs  Lab 12/11/24 1123 12/11/24 1612 12/11/24 2020 12/12/24 0726 12/12/24 1105  GLUCAP 143* 132* 146* 136* 115*    Discharge time spent: {LESS THAN/GREATER THAN:26388} 30 minutes.  Signed: Bernardino KATHEE Come, MD [name] 12/12/2024 "

## 2024-12-12 NOTE — Plan of Care (Signed)
" °  Problem: Education: Goal: Knowledge of General Education information will improve Description: Including pain rating scale, medication(s)/side effects and non-pharmacologic comfort measures Outcome: Adequate for Discharge   Problem: Health Behavior/Discharge Planning: Goal: Ability to manage health-related needs will improve Outcome: Adequate for Discharge   Problem: Clinical Measurements: Goal: Ability to maintain clinical measurements within normal limits will improve Outcome: Adequate for Discharge Goal: Will remain free from infection Outcome: Adequate for Discharge Goal: Diagnostic test results will improve Outcome: Adequate for Discharge Goal: Respiratory complications will improve Outcome: Adequate for Discharge Goal: Cardiovascular complication will be avoided Outcome: Adequate for Discharge   Problem: Activity: Goal: Risk for activity intolerance will decrease Outcome: Adequate for Discharge   Problem: Nutrition: Goal: Adequate nutrition will be maintained Outcome: Adequate for Discharge   Problem: Coping: Goal: Level of anxiety will decrease Outcome: Adequate for Discharge   Problem: Elimination: Goal: Will not experience complications related to bowel motility Outcome: Adequate for Discharge Goal: Will not experience complications related to urinary retention Outcome: Adequate for Discharge   Problem: Pain Managment: Goal: General experience of comfort will improve and/or be controlled Outcome: Adequate for Discharge   Problem: Safety: Goal: Ability to remain free from injury will improve Outcome: Adequate for Discharge   Problem: Skin Integrity: Goal: Risk for impaired skin integrity will decrease Outcome: Adequate for Discharge   Problem: Education: Goal: Ability to describe self-care measures that may prevent or decrease complications (Diabetes Survival Skills Education) will improve Outcome: Adequate for Discharge Goal: Individualized Educational  Video(s) Outcome: Adequate for Discharge   Problem: Coping: Goal: Ability to adjust to condition or change in health will improve Outcome: Adequate for Discharge   Problem: Fluid Volume: Goal: Ability to maintain a balanced intake and output will improve Outcome: Adequate for Discharge   Problem: Metabolic: Goal: Ability to maintain appropriate glucose levels will improve Outcome: Adequate for Discharge   Problem: Nutritional: Goal: Maintenance of adequate nutrition will improve Outcome: Adequate for Discharge Goal: Progress toward achieving an optimal weight will improve Outcome: Adequate for Discharge   Problem: Skin Integrity: Goal: Risk for impaired skin integrity will decrease Outcome: Adequate for Discharge   Problem: Tissue Perfusion: Goal: Adequacy of tissue perfusion will improve Outcome: Adequate for Discharge   "

## 2024-12-13 ENCOUNTER — Ambulatory Visit: Attending: Cardiology | Admitting: Cardiology

## 2024-12-13 ENCOUNTER — Telehealth: Payer: Self-pay

## 2024-12-13 NOTE — Transitions of Care (Post Inpatient/ED Visit) (Signed)
" ° °  12/13/2024  Name: Taylor Obrien MRN: [id number] DOB: [birth year]  Today's TOC FU Call Status: Today's TOC FU Call Status:: Unsuccessful Call (1st Attempt) Unsuccessful Call (1st Attempt) Date: 12/13/24  Attempted to reach the patient regarding the most recent Inpatient/ED visit.  Follow Up Plan: Additional outreach attempts will be made to reach the patient to complete the Transitions of Care (Post Inpatient/ED visit) call.   Shermika Balthaser J. Ashtyn Meland RN, MSN [facility], [facility] RN Care Manager Direct Dial: [phone]  Fax: [phone] Website: [email or web address]   "

## 2024-12-24 ENCOUNTER — Other Ambulatory Visit: Payer: Self-pay

## 2024-12-24 ENCOUNTER — Inpatient Hospital Stay (HOSPITAL_COMMUNITY)
Admission: EM | Admit: 2024-12-24 | Discharge: 2024-12-28 | DRG: 291 | Disposition: A | Discharge status: Home / Self Care | Attending: Internal Medicine | Admitting: Internal Medicine

## 2024-12-24 ENCOUNTER — Emergency Department (HOSPITAL_COMMUNITY)

## 2024-12-24 DIAGNOSIS — I639 Cerebral infarction, unspecified: Secondary | ICD-10-CM | POA: Diagnosis present

## 2024-12-24 DIAGNOSIS — Z833 Family history of diabetes mellitus: Secondary | ICD-10-CM

## 2024-12-24 DIAGNOSIS — I252 Old myocardial infarction: Secondary | ICD-10-CM

## 2024-12-24 DIAGNOSIS — N184 Chronic kidney disease, stage 4 (severe): Secondary | ICD-10-CM | POA: Diagnosis present

## 2024-12-24 DIAGNOSIS — R0602 Shortness of breath: Secondary | ICD-10-CM

## 2024-12-24 DIAGNOSIS — F17210 Nicotine dependence, cigarettes, uncomplicated: Secondary | ICD-10-CM | POA: Diagnosis present

## 2024-12-24 DIAGNOSIS — I161 Hypertensive emergency: Secondary | ICD-10-CM | POA: Diagnosis present

## 2024-12-24 DIAGNOSIS — I2489 Other forms of acute ischemic heart disease: Secondary | ICD-10-CM | POA: Diagnosis present

## 2024-12-24 DIAGNOSIS — Z83438 Family history of other disorder of lipoprotein metabolism and other lipidemia: Secondary | ICD-10-CM | POA: Diagnosis not present

## 2024-12-24 DIAGNOSIS — E1165 Type 2 diabetes mellitus with hyperglycemia: Secondary | ICD-10-CM | POA: Diagnosis present

## 2024-12-24 DIAGNOSIS — J810 Acute pulmonary edema: Secondary | ICD-10-CM | POA: Diagnosis not present

## 2024-12-24 DIAGNOSIS — Z5941 Food insecurity: Secondary | ICD-10-CM

## 2024-12-24 DIAGNOSIS — Z7982 Long term (current) use of aspirin: Secondary | ICD-10-CM | POA: Diagnosis not present

## 2024-12-24 DIAGNOSIS — I509 Heart failure, unspecified: Secondary | ICD-10-CM | POA: Diagnosis not present

## 2024-12-24 DIAGNOSIS — Z91148 Patient's other noncompliance with medication regimen for other reason: Secondary | ICD-10-CM

## 2024-12-24 DIAGNOSIS — Z8249 Family history of ischemic heart disease and other diseases of the circulatory system: Secondary | ICD-10-CM

## 2024-12-24 DIAGNOSIS — I214 Non-ST elevation (NSTEMI) myocardial infarction: Secondary | ICD-10-CM | POA: Diagnosis present

## 2024-12-24 DIAGNOSIS — D631 Anemia in chronic kidney disease: Secondary | ICD-10-CM | POA: Diagnosis present

## 2024-12-24 DIAGNOSIS — Z1152 Encounter for screening for COVID-19: Secondary | ICD-10-CM

## 2024-12-24 DIAGNOSIS — E872 Acidosis, unspecified: Secondary | ICD-10-CM | POA: Diagnosis present

## 2024-12-24 DIAGNOSIS — E782 Mixed hyperlipidemia: Secondary | ICD-10-CM | POA: Diagnosis present

## 2024-12-24 DIAGNOSIS — Z79899 Other long term (current) drug therapy: Secondary | ICD-10-CM | POA: Diagnosis not present

## 2024-12-24 DIAGNOSIS — K219 Gastro-esophageal reflux disease without esophagitis: Secondary | ICD-10-CM | POA: Diagnosis present

## 2024-12-24 DIAGNOSIS — I255 Ischemic cardiomyopathy: Secondary | ICD-10-CM | POA: Diagnosis present

## 2024-12-24 DIAGNOSIS — I130 Hypertensive heart and chronic kidney disease with heart failure and stage 1 through stage 4 chronic kidney disease, or unspecified chronic kidney disease: Secondary | ICD-10-CM | POA: Diagnosis present

## 2024-12-24 DIAGNOSIS — E1151 Type 2 diabetes mellitus with diabetic peripheral angiopathy without gangrene: Secondary | ICD-10-CM | POA: Diagnosis present

## 2024-12-24 DIAGNOSIS — Z5989 Other problems related to housing and economic circumstances: Secondary | ICD-10-CM

## 2024-12-24 DIAGNOSIS — Z7902 Long term (current) use of antithrombotics/antiplatelets: Secondary | ICD-10-CM

## 2024-12-24 DIAGNOSIS — E1122 Type 2 diabetes mellitus with diabetic chronic kidney disease: Secondary | ICD-10-CM | POA: Diagnosis present

## 2024-12-24 DIAGNOSIS — F32A Depression, unspecified: Secondary | ICD-10-CM | POA: Diagnosis present

## 2024-12-24 DIAGNOSIS — J9601 Acute respiratory failure with hypoxia: Secondary | ICD-10-CM | POA: Diagnosis present

## 2024-12-24 DIAGNOSIS — N1831 Chronic kidney disease, stage 3a: Secondary | ICD-10-CM | POA: Diagnosis present

## 2024-12-24 DIAGNOSIS — I69398 Other sequelae of cerebral infarction: Secondary | ICD-10-CM

## 2024-12-24 DIAGNOSIS — Z993 Dependence on wheelchair: Secondary | ICD-10-CM | POA: Diagnosis not present

## 2024-12-24 DIAGNOSIS — Z813 Family history of other psychoactive substance abuse and dependence: Secondary | ICD-10-CM

## 2024-12-24 DIAGNOSIS — Z5982 Transportation insecurity: Secondary | ICD-10-CM

## 2024-12-24 DIAGNOSIS — I5021 Acute systolic (congestive) heart failure: Secondary | ICD-10-CM | POA: Diagnosis present

## 2024-12-24 DIAGNOSIS — Z720 Tobacco use: Secondary | ICD-10-CM | POA: Diagnosis present

## 2024-12-24 DIAGNOSIS — I5023 Acute on chronic systolic (congestive) heart failure: Secondary | ICD-10-CM | POA: Diagnosis present

## 2024-12-24 LAB — BLOOD GAS, VENOUS
Acid-base deficit: 12 mmol/L — ABNORMAL HIGH (ref 0.0–2.0)
Acid-base deficit: 4.8 mmol/L — ABNORMAL HIGH (ref 0.0–2.0)
Bicarbonate: 18.7 mmol/L — ABNORMAL LOW (ref 20.0–28.0)
Bicarbonate: 20.5 mmol/L (ref 20.0–28.0)
Drawn by: 46251
Drawn by: 7049
O2 Saturation: 43.9 %
O2 Saturation: 98.3 %
Patient temperature: 36
Patient temperature: 36.9
pCO2, Ven: 60 mmHg (ref 44–60)
pCO2, Ven: 38 mmHg — ABNORMAL LOW (ref 44–60)
pH, Ven: 7.09 — CL (ref 7.25–7.43)
pH, Ven: 7.34 (ref 7.25–7.43)
pO2, Ven: <31 mmHg — CL (ref 32–45)
pO2, Ven: 85 mmHg — ABNORMAL HIGH (ref 32–45)

## 2024-12-24 LAB — COMPREHENSIVE METABOLIC PANEL WITH GFR
ALT: 21 U/L (ref 0–44)
AST: 67 U/L — ABNORMAL HIGH (ref 15–41)
Albumin: 3.9 g/dL (ref 3.5–5.0)
Alkaline Phosphatase: 76 U/L (ref 38–126)
Anion gap: 23 — ABNORMAL HIGH (ref 5–15)
BUN: 29 mg/dL — ABNORMAL HIGH (ref 6–20)
CO2: 15 mmol/L — ABNORMAL LOW (ref 22–32)
Calcium: 9.4 mg/dL (ref 8.9–10.3)
Chloride: 105 mmol/L (ref 98–111)
Creatinine, Ser: 3.77 mg/dL — ABNORMAL HIGH (ref 0.61–1.24)
GFR, Estimated: 18 mL/min — ABNORMAL LOW
Glucose, Bld: 244 mg/dL — ABNORMAL HIGH (ref 70–99)
Potassium: 4 mmol/L (ref 3.5–5.1)
Sodium: 143 mmol/L (ref 135–145)
Total Bilirubin: 0.3 mg/dL (ref 0.0–1.2)
Total Protein: 7.8 g/dL (ref 6.5–8.1)

## 2024-12-24 LAB — LACTIC ACID, PLASMA
Lactic Acid, Venous: 7.6 mmol/L (ref 0.5–1.9)
Lactic Acid, Venous: 1.5 mmol/L (ref 0.5–1.9)

## 2024-12-24 LAB — CBG MONITORING, ED: Glucose-Capillary: 106 mg/dL — ABNORMAL HIGH (ref 70–99)

## 2024-12-24 LAB — TROPONIN T, HIGH SENSITIVITY
Troponin T High Sensitivity: 426 ng/L (ref 0–19)
Troponin T High Sensitivity: 724 ng/L (ref 0–19)

## 2024-12-24 LAB — RESP PANEL BY RT-PCR (RSV, FLU A&B, COVID)  RVPGX2
Influenza A by PCR: NEGATIVE
Influenza B by PCR: NEGATIVE
Resp Syncytial Virus by PCR: NEGATIVE
SARS Coronavirus 2 by RT PCR: NEGATIVE

## 2024-12-24 LAB — APTT: aPTT: 30 s (ref 24–36)

## 2024-12-24 LAB — PROTIME-INR
INR: 1 (ref 0.8–1.2)
Prothrombin Time: 13.4 s (ref 11.4–15.2)

## 2024-12-24 LAB — PRO BRAIN NATRIURETIC PEPTIDE: Pro Brain Natriuretic Peptide: 9603 pg/mL — ABNORMAL HIGH

## 2024-12-24 LAB — TSH: TSH: 0.521 u[IU]/mL (ref 0.350–4.500)

## 2024-12-24 MED ORDER — NITROGLYCERIN IN D5W 200-5 MCG/ML-% IV SOLN
5.0000 ug/min | INTRAVENOUS | Status: DC
  Administered 2024-12-24: 5 ug/min via INTRAVENOUS
  Filled 2024-12-24: qty 250

## 2024-12-24 MED ORDER — ONDANSETRON HCL 4 MG/2ML IJ SOLN: 4.0000 mg | Freq: Once | INTRAMUSCULAR | Status: AC

## 2024-12-24 MED ORDER — ICOSAPENT ETHYL 1 G PO CAPS
2.0000 g | ORAL_CAPSULE | Freq: Two times a day (BID) | ORAL | Status: DC
  Administered 2024-12-25 – 2024-12-28 (×7): 2 g via ORAL
  Filled 2024-12-24 (×9): qty 2

## 2024-12-24 MED ORDER — INSULIN ASPART 100 UNIT/ML IJ SOLN
0.0000 [IU] | Freq: Three times a day (TID) | INTRAMUSCULAR | Status: DC
  Administered 2024-12-26 – 2024-12-27 (×3): 1 [IU] via SUBCUTANEOUS
  Filled 2024-12-24 (×3): qty 1

## 2024-12-24 MED ORDER — FUROSEMIDE 10 MG/ML IJ SOLN
80.0000 mg | Freq: Once | INTRAMUSCULAR | Status: AC
  Administered 2024-12-24: 80 mg via INTRAVENOUS
  Filled 2024-12-24: qty 8

## 2024-12-24 MED ORDER — ISOSORB DINITRATE-HYDRALAZINE 20-37.5 MG PO TABS
2.0000 | ORAL_TABLET | Freq: Three times a day (TID) | ORAL | Status: DC
  Administered 2024-12-25 – 2024-12-26 (×6): 2 via ORAL
  Filled 2024-12-24 (×10): qty 2

## 2024-12-24 MED ORDER — LABETALOL HCL 5 MG/ML IV SOLN: 40.0000 mg | Freq: Once | INTRAVENOUS | Status: AC

## 2024-12-24 MED ORDER — INSULIN ASPART 100 UNIT/ML IJ SOLN: 0.0000 [IU] | Freq: Every day | INTRAMUSCULAR | Status: DC

## 2024-12-24 MED ORDER — PANTOPRAZOLE SODIUM 40 MG PO TBEC
40.0000 mg | DELAYED_RELEASE_TABLET | Freq: Every day | ORAL | Status: DC
  Administered 2024-12-25 – 2024-12-28 (×4): 40 mg via ORAL
  Filled 2024-12-24 (×4): qty 1

## 2024-12-24 MED ORDER — ESCITALOPRAM OXALATE 10 MG PO TABS
20.0000 mg | ORAL_TABLET | Freq: Every day | ORAL | Status: DC
  Administered 2024-12-25 – 2024-12-28 (×4): 20 mg via ORAL
  Filled 2024-12-24 (×4): qty 2

## 2024-12-24 MED ORDER — HEPARIN BOLUS VIA INFUSION
4000.0000 [IU] | Freq: Once | INTRAVENOUS | Status: AC
  Administered 2024-12-24: 4000 [IU] via INTRAVENOUS

## 2024-12-24 MED ORDER — CALCITRIOL 0.25 MCG PO CAPS
0.2500 ug | ORAL_CAPSULE | ORAL | Status: DC
  Administered 2024-12-27: 0.25 ug via ORAL
  Filled 2024-12-24 (×2): qty 1

## 2024-12-24 MED ORDER — HEPARIN (PORCINE) 25000 UT/250ML-% IV SOLN
750.0000 [IU]/h | INTRAVENOUS | Status: DC
  Administered 2024-12-24: 850 [IU]/h via INTRAVENOUS
  Administered 2024-12-25 – 2024-12-27 (×2): 750 [IU]/h via INTRAVENOUS
  Filled 2024-12-24 (×3): qty 250

## 2024-12-24 MED ORDER — METOPROLOL TARTRATE 5 MG/5ML IV SOLN
INTRAVENOUS | Status: AC
  Administered 2024-12-24: 5 mg
  Filled 2024-12-24: qty 5

## 2024-12-24 MED ORDER — LABETALOL HCL 5 MG/ML IV SOLN
INTRAVENOUS | Status: AC
  Administered 2024-12-24: 40 mg via INTRAVENOUS
  Filled 2024-12-24: qty 8

## 2024-12-24 MED ORDER — FENTANYL CITRATE (PF) 100 MCG/2ML IJ SOLN
25.0000 ug | Freq: Once | INTRAMUSCULAR | Status: AC
  Administered 2024-12-24: 25 ug via INTRAVENOUS
  Filled 2024-12-24: qty 2

## 2024-12-24 MED ORDER — LABETALOL HCL 5 MG/ML IV SOLN
40.0000 mg | Freq: Once | INTRAVENOUS | Status: AC
  Administered 2024-12-24: 40 mg via INTRAVENOUS
  Filled 2024-12-24: qty 8

## 2024-12-24 MED ORDER — ACETAMINOPHEN 325 MG PO TABS
650.0000 mg | ORAL_TABLET | Freq: Four times a day (QID) | ORAL | Status: DC | PRN
  Administered 2024-12-25 – 2024-12-26 (×2): 650 mg via ORAL
  Filled 2024-12-24 (×2): qty 2

## 2024-12-24 MED ORDER — ACETAMINOPHEN 650 MG RE SUPP: 650.0000 mg | Freq: Four times a day (QID) | RECTAL | Status: DC | PRN

## 2024-12-24 MED ORDER — FENTANYL CITRATE (PF) 100 MCG/2ML IJ SOLN: 50.0000 ug | Freq: Once | INTRAMUSCULAR | Status: DC

## 2024-12-24 MED ORDER — ONDANSETRON HCL 4 MG/2ML IJ SOLN
INTRAMUSCULAR | Status: AC
  Administered 2024-12-24: 4 mg via INTRAVENOUS
  Filled 2024-12-24: qty 2

## 2024-12-24 MED ORDER — ATORVASTATIN CALCIUM 80 MG PO TABS
80.0000 mg | ORAL_TABLET | Freq: Every day | ORAL | Status: DC
  Administered 2024-12-25 – 2024-12-28 (×4): 80 mg via ORAL
  Filled 2024-12-24 (×3): qty 1
  Filled 2024-12-24: qty 2

## 2024-12-24 MED ORDER — CARVEDILOL 25 MG PO TABS
25.0000 mg | ORAL_TABLET | Freq: Two times a day (BID) | ORAL | Status: DC
  Administered 2024-12-25 – 2024-12-26 (×4): 25 mg via ORAL
  Filled 2024-12-24 (×3): qty 1
  Filled 2024-12-24: qty 2

## 2024-12-24 MED ORDER — ASPIRIN 81 MG PO TBEC
81.0000 mg | DELAYED_RELEASE_TABLET | Freq: Every day | ORAL | Status: DC
  Administered 2024-12-25 – 2024-12-28 (×4): 81 mg via ORAL
  Filled 2024-12-24 (×4): qty 1

## 2024-12-24 MED ORDER — FUROSEMIDE 10 MG/ML IJ SOLN
40.0000 mg | Freq: Two times a day (BID) | INTRAMUSCULAR | Status: DC
  Administered 2024-12-25 – 2024-12-26 (×3): 40 mg via INTRAVENOUS
  Filled 2024-12-24 (×3): qty 4

## 2024-12-24 MED ORDER — NICOTINE 21 MG/24HR TD PT24
21.0000 mg | MEDICATED_PATCH | Freq: Every day | TRANSDERMAL | Status: DC
  Administered 2024-12-24 – 2024-12-28 (×5): 21 mg via TRANSDERMAL
  Filled 2024-12-24 (×5): qty 1

## 2024-12-24 MED ORDER — FENOFIBRATE 54 MG PO TABS
54.0000 mg | ORAL_TABLET | Freq: Every day | ORAL | Status: DC
  Administered 2024-12-25 – 2024-12-28 (×4): 54 mg via ORAL
  Filled 2024-12-24 (×5): qty 1

## 2024-12-24 NOTE — Progress Notes (Signed)
" ° °  PCCM transfer request    Sending physician: Dr Suzette  Sending facility: [facility]  Reason for transfer: Hypertensive crisis on BIPAP  Brief case summary: 59 Y/O M presenting with hypertensive crisis, flash pulmonary edema and NSTEMI.   Recommendations made prior to transfer: N/A  Transfer accepted: Yes    Taylor Obrien 12/24/24 7:32 PM [facility]  For contact information, see Amion. If no response to pager, please call PCCM consult pager. After hours, 7PM- 7AM, please call Elink.  "

## 2024-12-24 NOTE — ED Notes (Signed)
 [facility] notified of BIDIL 

## 2024-12-24 NOTE — H&P (Addendum)
 "                                                                                                          [facility] H&P   Patient Demographics:    Taylor Obrien, is a 59 y.o. male  MRN: [id number]   DOB - 03/28/1966  Admit Date - 12/24/2024  Outpatient Primary MD for the patient is Trudy Ave, NP    No chief complaint on file.     HPI:    Taylor Obrien  is a 59 y.o. male, with medical history of HFrEF (EF 30-35%), CKD4 (Cr ~4.0), prior CVA, PAD, and T2DM with recent hospitalization x 3 within the last month, just discharged 1//2026, multiple admissions for hypertensive emergency, NSTEMI due to demand ischemia, requiring nitro drip, aspirin , Plavix  and nitro drip for blood pressure control with multiple adjustment of his medications.  - Patient presents to ED today secondary to dyspnea, chest pain, saturating 76% on room air with increased work of breathing, patient reports that compliance with medication, blood pressure noted to be significantly elevated 269/172, requiring BiPAP support, tachycardic at 154, initial VBG with significant hypoxemia, acidosis, chest x-ray with volume overload, with significantly elevated troponins, with EKG changes, patient much improved on nitro drip, CPAP and IV Lasix , he is currently tolerating room air blood pressure within normal limits on nitro, he was started on heparin  drip per cardiology, with recommendation to admit to [facility] for further evaluation.    Review of systems:      A full 10 point Review of Systems was done, except as stated above, all other Review of Systems were negative.   With Past History of the following :    Past Medical History:  Diagnosis Date   Acute alcoholic pancreatitis    Arthritis    CHF (congestive heart failure) (HCC)    Chronic pain    Depression    Diabetes mellitus without complication (HCC)    Essential hypertension, benign    GERD (gastroesophageal reflux disease)    Headache    HTN  (hypertension) 11/27/2015   Hyperlipidemia    Peripheral vascular disease    Sleep apnea    could not tolerate CPAP   Stroke (HCC)    2016      Past Surgical History:  Procedure Laterality Date   BIOPSY  08/19/2022   Procedure: BIOPSY;  Surgeon: Cindie Carlin POUR, DO;  Location: [facility] ENDO SUITE;  Service: Endoscopy;;   CLOSED REDUCTION MANDIBULAR FRACTURE W/ ARCH BARS     + multiple extractions   COLONOSCOPY WITH PROPOFOL  N/A 08/19/2022   Procedure: COLONOSCOPY WITH PROPOFOL ;  Surgeon: Cindie Carlin POUR, DO;  Location: [facility];  Service: Endoscopy;  Laterality: N/A;  9:15am   Condyloma resection     ESOPHAGOGASTRODUODENOSCOPY (EGD) WITH PROPOFOL  N/A 08/19/2022   Procedure: ESOPHAGOGASTRODUODENOSCOPY (EGD) WITH PROPOFOL ;  Surgeon: Cindie Carlin POUR, DO;  Location: [facility];  Service: Endoscopy;  Laterality: N/A;   MULTIPLE EXTRACTIONS WITH ALVEOLOPLASTY Bilateral 01/23/2018   Procedure: DENTAL EXTRACTION OF TEETH NUMBER  ONE, TWO, THREE, FOUR, FIVE, SIX, SEVEN, EIGHT, NINE, TEN, ELEVEN, TWELVE, THIRTEEN, FOURTEEN, FIFTEEN, SIXTEEN, SEVENTEEN, TWENTY, TWENTY-ONE, TWENTY-TWO, TWENTY-THREE, TWENTY-FOUR, TWENTY-FIVE, TWENTY-SIX, TWENTY-SEVEN, TWENTY-EIGHT, TWENTY-NINE, THIRTY-TWO WITH ALVEOLOPLASTY;  Surgeon: Sheryle Hamilton, DDS;  Location: [facility] OR;  Service: Oral Surgery;  Lat   POLYPECTOMY  08/19/2022   Procedure: POLYPECTOMY;  Surgeon: Cindie Carlin POUR, DO;  Location: [facility];  Service: Endoscopy;;   RADIOLOGY WITH ANESTHESIA N/A 11/18/2015   Procedure: RADIOLOGY WITH ANESTHESIA;  Surgeon: Thyra Nash, MD;  Location: [facility] OR;  Service: Radiology;  Laterality: N/A;   Removal foreign body right shoulder     Right rotator cuff repair     TEE WITHOUT CARDIOVERSION N/A 11/21/2015   Procedure: TRANSESOPHAGEAL ECHOCARDIOGRAM (TEE);  Surgeon: Ezra GORMAN Shuck, MD;  Location: [facility] ENDOSCOPY;  Service: Cardiovascular;  Laterality: N/A;   TOOTH EXTRACTION N/A 01/24/2018   Procedure: SUTURE  ORAL WOUND;  Surgeon: Sheryle Hamilton, DDS;  Location: [facility] OR;  Service: Oral Surgery;  Laterality: N/A;      Social History:     Social History   Tobacco Use   Smoking status: Every Day    Current packs/day: 0.50    Average packs/day: 0.5 packs/day for 30.0 years (15.0 ttl pk-yrs)    Types: Cigarettes   Smokeless tobacco: Never  Substance Use Topics   Alcohol use: Not Currently    Comment: 2 40 oz daily. 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 Stopped liquor 5 years ago.states no etoh since 03/2022       Family History :     Family History  Problem Relation Age of Onset   Diabetes Mother    Hypertension Mother    Drug abuse Mother    Hyperlipidemia Mother    Diabetes Father    Hypertension Father    Hyperlipidemia Father    Diabetes Brother    Hypertension Brother       Home Medications:   Prior to Admission medications  Medication Sig Start Date End Date Taking? Authorizing Provider  acetaminophen  (TYLENOL ) 325 MG tablet Take 2 tablets (650 mg total) by mouth every 6 (six) hours as needed for mild pain (pain score 1-3) or fever (or Fever >/= 101). 12/03/24   Pearlean Manus, MD  aspirin  EC 81 MG tablet Take 1 tablet (81 mg total) by mouth daily with breakfast. 12/03/24 12/03/25  Pearlean Manus, MD  atorvastatin  (LIPITOR ) 80 MG tablet Take 1 tablet (80 mg total) by mouth daily. 12/03/24   Pearlean Manus, MD  calcitRIOL  (ROCALTROL ) 0.25 MCG capsule Take 1 capsule (0.25 mcg total) by mouth every Monday, Wednesday, and Friday. 11/08/24 02/06/25  Pokhrel, Laxman, MD  carvedilol  (COREG ) 25 MG tablet Take 1 tablet (25 mg total) by mouth 2 (two) times daily with a meal. 12/12/24 03/12/25  Bryn Bernardino NOVAK, MD  escitalopram  (LEXAPRO ) 20 MG tablet Take 1 tablet (20 mg total) by mouth daily. 12/03/24   Pearlean Manus, MD  fenofibrate  (TRICOR ) 145 MG tablet Take 145 mg by mouth daily. 04/10/22   [provider]  isosorbide -hydrALAZINE  (BIDIL ) 20-37.5 MG tablet Take 2 tablets by  mouth 3 (three) times daily. 12/12/24   Bryn Bernardino NOVAK, MD  lidocaine  (LIDODERM ) 5 % Place 2 patches onto the skin daily. Remove & Discard patch within 12 hours or as directed by MD Patient taking differently: Place 2 patches onto the skin daily as needed (Pain). Remove & Discard patch within 12 hours or as directed by MD 04/26/24   Donnelly Mellow, MD  loperamide  (IMODIUM ) 2 MG capsule Take 1 capsule (2 mg total) by mouth as needed for diarrhea or loose stools (Loose stools/Diarrhea). 12/03/24   Pearlean Manus, MD  losartan  (COZAAR ) 50 MG tablet Take 1 tablet (50 mg total) by mouth daily. 12/13/24   Bryn Bernardino NOVAK, MD  methocarbamol  (ROBAXIN ) 500 MG tablet Take 500 mg by mouth every 8 (eight) hours as needed. 11/16/24   [provider]  pantoprazole  (PROTONIX ) 40 MG tablet Take 1 tablet (40 mg total) by mouth daily. 12/03/24   Pearlean Manus, MD  potassium chloride  (KLOR-CON ) 10 MEQ tablet Take 10 mEq by mouth daily. 12/03/24   [provider]  torsemide  (DEMADEX ) 20 MG tablet Take 1 tablet (20 mg total) by mouth 2 (two) times daily. 12/12/24   Bryn Bernardino NOVAK, MD  VASCEPA  1 g capsule Take 2 g by mouth 2 (two) times daily. 03/12/22   [provider]  Vitamin D , Ergocalciferol , (DRISDOL ) 1.25 MG (50000 UNIT) CAPS capsule Take 1 capsule (50,000 Units total) by mouth every 7 (seven) days. 11/10/24   Pokhrel, Laxman, MD     Allergies:    Allergies[1]   Physical Exam:   Vitals  Blood pressure (!) 137/98, pulse 82, temperature 98.8 F (37.1 C), resp. rate 19, height 5' 6 (1.676 m), weight 70.2 kg, SpO2 98%.   1. General Frail elderly male, laying in bed, no apparent distress  2. Normal affect and insight, Not Suicidal or Homicidal, Awake Alert, Oriented X 3.  3. No F.N deficits, ALL C.Nerves Intact, phonic left lower extremity weakness  4. Ears and Eyes appear Normal, Conjunctivae clear, PERRLA. Moist Oral Mucosa.  5. Supple Neck, No Carotid Bruits.  6. Symmetrical  Chest wall movement, Good air movement bilaterally, crackles  7. RRR, No Gallops, Rubs or Murmurs, +1 edema  8. Positive Bowel Sounds, Abdomen Soft, No tenderness, No organomegaly appriciated,No rebound -guarding or rigidity.  9.  No Cyanosis, Normal Skin Turgor, No Skin Rash or Bruise.  10. Good muscle tone,  joints appear normal , no effusions, Normal ROM.    Data Review:    CBC No results for input(s): WBC, HGB, HCT, PLT, MCV, MCH, MCHC, RDW, LYMPHSABS, MONOABS, EOSABS, BASOSABS, BANDABS in the last 168 hours.  Invalid input(s): NEUTRABS, BANDSABD ------------------------------------------------------------------------------------------------------------------  Chemistries  Recent Labs  Lab 12/24/24 1712  NA 143  K 4.0  CL 105  CO2 15*  GLUCOSE 244*  BUN 29*  CREATININE 3.77*  CALCIUM  9.4  AST 67*  ALT 21  ALKPHOS 76  BILITOT 0.3   ------------------------------------------------------------------------------------------------------------------ estimated creatinine clearance is 19.7 mL/min (A) (by C-G formula based on SCr of 3.77 mg/dL (H)). ------------------------------------------------------------------------------------------------------------------ Recent Labs    12/24/24 1922  TSH 0.521    Coagulation profile Recent Labs  Lab 12/24/24 1922  INR 1.0   ------------------------------------------------------------------------------------------------------------------- No results for input(s): DDIMER in the last 72 hours. -------------------------------------------------------------------------------------------------------------------  Cardiac Enzymes No results for input(s): CKMB, TROPONINI, MYOGLOBIN in the last 168 hours.  Invalid input(s): CK ------------------------------------------------------------------------------------------------------------------ No results found for:  BNP   ---------------------------------------------------------------------------------------------------------------  Urinalysis    Component Value Date/Time   COLORURINE YELLOW 12/02/2024 1543   APPEARANCEUR HAZY (A) 12/02/2024 1543   LABSPEC 1.016 12/02/2024 1543   PHURINE 5.0 12/02/2024 1543   GLUCOSEU NEGATIVE 12/02/2024 1543   HGBUR NEGATIVE 12/02/2024 1543   BILIRUBINUR NEGATIVE 12/02/2024 1543   KETONESUR NEGATIVE 12/02/2024 1543   PROTEINUR >=300 (A) 12/02/2024 1543   UROBILINOGEN 0.2 09/22/2015 0750   NITRITE NEGATIVE 12/02/2024 1543  LEUKOCYTESUR LARGE (A) 12/02/2024 1543    ----------------------------------------------------------------------------------------------------------------   Imaging Results:    DG Chest Port 1 View Result Date: 12/24/2024 EXAM: 1 VIEW(S) XRAY OF THE CHEST 12/24/2024 05:20:26 PM COMPARISON: 12/10/2024 CLINICAL HISTORY: sob FINDINGS: LUNGS AND PLEURA: Interstitial prominence throughout the lungs, improved since prior study but likely reflecting residual or recurrent pulmonary edema/chf. No pleural effusion. No pneumothorax. HEART AND MEDIASTINUM: Mild cardiomegaly. BONES AND SOFT TISSUES: No acute osseous abnormality. IMPRESSION: 1. Interstitial prominence throughout the lungs, improved since prior study, likely reflecting residual or recurrent pulmonary edema/CHF. Electronically signed by: Franky Crease MD 12/24/2024 05:44 PM EST RP Workstation: [id number]     EKG: Sinus rhythm at 82, QTc of 483 with LVH    Assessment & Plan:    Principal Problem:   NSTEMI (non-ST elevated myocardial infarction) (HCC) Active Problems:   Mixed hyperlipidemia   Tobacco abuse   Stroke (cerebrum) (HCC)   Stage 3a chronic kidney disease (CKD) (HCC)   Type 2 diabetes mellitus with hyperglycemia (HCC)  NSTEMI Elevated troponins - Patient with greatly appreciated, concern for NSTEMI, possibly demand ischemia secondary to hypertensive emergency, and  respiratory failure. - Continue with nitro drip per cardiology recommendation. - Continue with heparin  drip for now. - Took his aspirin  earlier today, continue with aspirin  daily. - Echo in a.m.SABRA - Will admit to [facility] urology recommendation  Hypertensive emergency -Multiple admissions in the past for the same, patient reports he is compliant with medication. -Improved on nitro drip, will resume on his home medications that he is now chest pain-free hoping to wean off nitro drip.  Acute respiratory failure with hypoxemia Hypertensive emergency Flash pulmonary edema -On BiPAP initially, much improved after diuresis and blood pressure control -He is currently on 3 L nasal cannula -Received 80 mg IV Lasix  while in ED.   Lactic acidosis with no infectious etiology - Secondary to respiratory distress, hypertensive emergency, has normalized with controlling blood pressure and improving respiratory status  Acute on chronic HFrEF (EF 30-35%):  Ischemic cardiomyopathy -Give the 80 mg of IV Lasix , more comfortable now, we will continue with IV Lasix  40 mg twice daily and await further recommendations from cardiology. - Resume Coreg , BiDil , aspirin , statin , will hold losartan  for now . Troponin elevation: Suspect demand ischemia (Troponin considerably lower than prior admit). No chest pain or ischemic ECG changes. Troponin has peaked.  - On ASA, high-intensity statin, beta blocker   CKd stage IV  - 3.7, baseline 4.5, creatinine lower than baseline most likely in the setting of volume overload due to CHF   Diabetes mellitus, type II - Appears to be diet controlled, not on any home medication, will keep an extra sliding scale during hospital stay  tobacco abuse -counseled, continue with nicotine  patch  Debility, history of CVA: - Will consult PT, at baseline wheelchair dependent given left lower extremity weakness.    DVT Prophylaxis Heparin  drip   AM Labs Ordered, also  please review Full Orders  Family Communication: Admission, patients condition and plan of care including tests being ordered have been discussed with the patient who indicate understanding and agree with the plan and Code Status.  Code Status full code  Likely DC to home  Consults called: Cardiology  Admission status: Inpatient  Time spent in minutes : 65 minutes   Brayton Lye M.D on 12/24/2024 at 9:20 PM   [name] - Office  [phone]        [1]  Allergies Allergen Reactions  Ibuprofen  Other (See Comments)    Avoid per PCP   Oxcarbazepine Other (See Comments)    Patient goes out of right state of mind.    "

## 2024-12-24 NOTE — ED Notes (Signed)
 Refuses foley.

## 2024-12-24 NOTE — H&P (Incomplete)
 "  NAME:  Taylor Obrien, MRN:  [id number], DOB:  03/18/1966, LOS: 0 ADMISSION DATE:  12/24/2024, CONSULTATION DATE:  12/24/2024 REFERRING MD:  Taylor Obrien, CHIEF COMPLAINT:  hypertensive crisis, pulmonary edema    History of Present Illness:  Taylor Obrien is a 59 year old male with history of HFrEF (EF 30-35%), CKD stage IV, prior CVA (with residual left sided hemiparesis, wheelchair bound), PAD and T2DM who presents as a transfer from [facility] for hypertensive crisis and flash pulmonary edema requiring BiPap. He had presented to [facility] ED for shortness of breath. On arrival he was found to be hypoxic and dyspneic with SpO2 76% on room air. He was placed on BiPap. CXR demonstrated pulmonary edema. Labs were remarkable for CO2 15, BUN/Cr 29/3.77, AST 67, pro-BNP 9603, high sensitivity troponin 436, lactic acid 7.6 and ABG 7.09/60/<31.   This is a fourth admission for the same since 11/2024. Most recently he was admitted 12/10/2024-12/12/2024 for hypertensive crisis, acute heart failure exacerbation and acute hypoxic respiratory failure in the setting of pulmonary edema. He reportedly had not filled his prescriptions from the previous discharge. He was managed with IV diuresis and resumption of anti-hypertensives/GDMT. Troponin elevation was thought to be due to demand ischemia.   Pertinent  Medical History  PAD, CVA (wheelchair dependent since 2016), T2DM, HLD, HFrEF, CKD stage 4, history of alcohol abuse and alcoholic pancreatitis, MDD, GERD, OSA intolerant of CPAP   Significant Hospital Events: Including procedures, antibiotic start and stop dates in addition to other pertinent events   12/24/2024: admitted to ICU for hypertensive crisis and acute hypoxic respiratory failure due to flash pulmonary edema   Interim History / Subjective:  Per above   Objective    Blood pressure (!) 163/129, pulse 100, resp. rate 19, SpO2 99%.    FiO2 (%):  [50 %-70 %] 50 % PEEP:  [8 cmH20] 8 cmH20 Pressure  Support:  [8 cmH20-10 cmH20] 10 cmH20   Intake/Output Summary (Last 24 hours) at 12/24/2024 1938 Last data filed at 12/24/2024 1921 Gross per 24 hour  Intake 2.6 ml  Output --  Net 2.6 ml   There were no vitals filed for this visit.  Examination: General: *** HENT: *** Lungs: *** Cardiovascular: *** Abdomen: *** Extremities: *** Neuro: *** GU: ***  Resolved problem list   Assessment and Plan   Acute on chronic systolic and diastolic heart failure; Echo 11/2024 EF 30-35%, normal RV, and moderate LVH.  Acute hypoxic respiratory failure due to flash pulmonary edema Hypertensive crisis NSTEMI  Non-anion gap metabolic acidosis due to lactic acidosis - Continue BiPap  - Continue nitroglycerin  drip for SBP goal< - Resume *** - IV diuresis - Cardiology consulted, appreciate recommendations. May need to consider LHC but has been deferred in the past due to advanced CKD  - Continue heparin  drip  - Trend lactic acid  - Consider refractory hypertension work up if persists after resumption of home medications  - Follow-up UDS and TSH - Follow-up Echo   History of CKD stage IV; baseline Cr~4.0, has been referred to nephrology but has not yet seen  - Trend BMP - Strict I/O - IV diuresis per above - Avoid nephrotoxins, renally dose medications, ensure adequate renal perfusion   History of CVA with residual left sided weakness  - PT/OT as appropriate   Labs   CBC: No results for input(s): WBC, NEUTROABS, HGB, HCT, MCV, PLT in the last 168 hours.  Basic Metabolic Panel: Recent Labs  Lab 12/24/24 1712  NA 143  K 4.0  CL 105  CO2 15*  GLUCOSE 244*  BUN 29*  CREATININE 3.77*  CALCIUM  9.4   GFR: Estimated Creatinine Clearance: 19.7 mL/min (A) (by C-G formula based on SCr of 3.77 mg/dL (H)). Recent Labs  Lab 12/24/24 1712  LATICACIDVEN 7.6*    Liver Function Tests: Recent Labs  Lab 12/24/24 1712  AST 67*  ALT 21  ALKPHOS 76  BILITOT 0.3  PROT 7.8   ALBUMIN 3.9   No results for input(s): LIPASE, AMYLASE in the last 168 hours. No results for input(s): AMMONIA in the last 168 hours.  ABG    Component Value Date/Time   HCO3 20.5 12/24/2024 1922   TCO2 26 11/18/2015 1111   ACIDBASEDEF 4.8 (H) 12/24/2024 1922   O2SAT 98.3 12/24/2024 1922     Coagulation Profile: No results for input(s): INR, PROTIME in the last 168 hours.  Cardiac Enzymes: No results for input(s): CKTOTAL, CKMB, CKMBINDEX, TROPONINI in the last 168 hours.  HbA1C: Hgb A1c MFr Bld  Date/Time Value Ref Range Status  10/31/2024 02:57 AM 5.8 (H) 4.8 - 5.6 % Final    Comment:    (NOTE) Diagnosis of Diabetes The following HbA1c ranges recommended by the American Diabetes Association (ADA) may be used as an aid in the diagnosis of diabetes mellitus.  Hemoglobin             Suggested A1C NGSP%              Diagnosis  <5.7                   Non Diabetic  5.7-6.4                Pre-Diabetic  >6.4                   Diabetic  <7.0                   Glycemic control for                       adults with diabetes.    01/30/2024 07:21 AM 6.7 (H) 4.8 - 5.6 % Final    Comment:    (NOTE) Pre diabetes:          5.7%-6.4%  Diabetes:              >6.4%  Glycemic control for   <7.0% adults with diabetes     CBG: No results for input(s): GLUCAP in the last 168 hours.  Review of Systems:   ***  Past Medical History:  He,  has a past medical history of Acute alcoholic pancreatitis, Arthritis, CHF (congestive heart failure) (HCC), Chronic pain, Depression, Diabetes mellitus without complication (HCC), Essential hypertension, benign, GERD (gastroesophageal reflux disease), Headache, HTN (hypertension) (11/27/2015), Hyperlipidemia, Peripheral vascular disease, Sleep apnea, and Stroke (HCC).   Surgical History:   Past Surgical History:  Procedure Laterality Date   BIOPSY  08/19/2022   Procedure: BIOPSY;  Surgeon: Taylor Obrien;   Location: [facility] END[facility]ITE;  Service: Endoscopy;;   CLOSED REDUCTION MANDIBULAR FRACTURE W/ ARCH BARS     + multiple extractions   COLONOSCOPY WITH PROPOFOL  N/A 08/19/2022   Procedure: COLONOSCOPY WITH PROPOFOL ;  Surgeon: Taylor Obrien;  Location: [facility];  Service: Endoscopy;  Laterality: N/A;  9:15am   Condyloma resection     ESOPHAGOGASTRODUODENOSCOPY (EGD) WITH PROPOFOL   N/A 08/19/2022   Procedure: ESOPHAGOGASTRODUODENOSCOPY (EGD) WITH PROPOFOL ;  Surgeon: Taylor Obrien;  Location: [facility];  Service: Endoscopy;  Laterality: N/A;   MULTIPLE EXTRACTIONS WITH ALVEOLOPLASTY Bilateral 01/23/2018   Procedure: DENTAL EXTRACTION OF TEETH NUMBER ONE, TWO, THREE, FOUR, FIVE, SIX, SEVEN, EIGHT, NINE, TEN, ELEVEN, TWELVE, THIRTEEN, FOURTEEN, FIFTEEN, SIXTEEN, SEVENTEEN, TWENTY, TWENTY-ONE, TWENTY-TWO, TWENTY-THREE, TWENTY-FOUR, TWENTY-FIVE, TWENTY-SIX, TWENTY-SEVEN, TWENTY-EIGHT, TWENTY-NINE, THIRTY-TWO WITH ALVEOLOPLASTY;  Surgeon: Sheryle Hamilton, DDS;  Location: [facility] OR;  Service: Oral Surgery;  Lat   POLYPECTOMY  08/19/2022   Procedure: POLYPECTOMY;  Surgeon: Taylor Obrien;  Location: [facility];  Service: Endoscopy;;   RADIOLOGY WITH ANESTHESIA N/A 11/18/2015   Procedure: RADIOLOGY WITH ANESTHESIA;  Surgeon: Thyra Nash, MD;  Location: [facility] OR;  Service: Radiology;  Laterality: N/A;   Removal foreign body right shoulder     Right rotator cuff repair     TEE WITHOUT CARDIOVERSION N/A 11/21/2015   Procedure: TRANSESOPHAGEAL ECHOCARDIOGRAM (TEE);  Surgeon: Ezra GORMAN Shuck, MD;  Location: [facility] ENDOSCOPY;  Service: Cardiovascular;  Laterality: N/A;   TOOTH EXTRACTION N/A 01/24/2018   Procedure: SUTURE ORAL WOUND;  Surgeon: Sheryle Hamilton, DDS;  Location: [facility] OR;  Service: Oral Surgery;  Laterality: N/A;     Social History:   reports that he has been smoking cigarettes. He has a 15 pack-year smoking history. He has never used smokeless tobacco. He reports that he does not  currently use alcohol. He reports current drug use. Drug: Marijuana.   Family History:  His family history includes Diabetes in his brother, father, and mother; Drug abuse in his mother; Hyperlipidemia in his father and mother; Hypertension in his brother, father, and mother.   Allergies Allergies[1]   Home Medications  Prior to Admission medications  Medication Sig Start Date End Date Taking? Authorizing Provider  acetaminophen  (TYLENOL ) 325 MG tablet Take 2 tablets (650 mg total) by mouth every 6 (six) hours as needed for mild pain (pain score 1-3) or fever (or Fever >/= 101). 12/03/24   Pearlean Manus, MD  aspirin  EC 81 MG tablet Take 1 tablet (81 mg total) by mouth daily with breakfast. 12/03/24 12/03/25  Pearlean Manus, MD  atorvastatin  (LIPITOR ) 80 MG tablet Take 1 tablet (80 mg total) by mouth daily. 12/03/24   Pearlean Manus, MD  calcitRIOL  (ROCALTROL ) 0.25 MCG capsule Take 1 capsule (0.25 mcg total) by mouth every Monday, Wednesday, and Friday. 11/08/24 02/06/25  Pokhrel, Laxman, MD  carvedilol  (COREG ) 25 MG tablet Take 1 tablet (25 mg total) by mouth 2 (two) times daily with a meal. 12/12/24 03/12/25  Bryn Bernardino NOVAK, MD  escitalopram  (LEXAPRO ) 20 MG tablet Take 1 tablet (20 mg total) by mouth daily. 12/03/24   Pearlean Manus, MD  fenofibrate  (TRICOR ) 145 MG tablet Take 145 mg by mouth daily. 04/10/22   [provider]  isosorbide -hydrALAZINE  (BIDIL ) 20-37.5 MG tablet Take 2 tablets by mouth 3 (three) times daily. 12/12/24   Bryn Bernardino NOVAK, MD  lidocaine  (LIDODERM ) 5 % Place 2 patches onto the skin daily. Remove & Discard patch within 12 hours or as directed by MD Patient taking differently: Place 2 patches onto the skin daily as needed (Pain). Remove & Discard patch within 12 hours or as directed by MD 04/26/24   Donnelly Mellow, MD  loperamide  (IMODIUM ) 2 MG capsule Take 1 capsule (2 mg total) by mouth as needed for diarrhea or loose stools (Loose stools/Diarrhea). 12/03/24    Pearlean Manus, MD  losartan  (COZAAR ) 50 MG  tablet Take 1 tablet (50 mg total) by mouth daily. 12/13/24   Bryn Bernardino NOVAK, MD  methocarbamol  (ROBAXIN ) 500 MG tablet Take 500 mg by mouth every 8 (eight) hours as needed. 11/16/24   [provider]  pantoprazole  (PROTONIX ) 40 MG tablet Take 1 tablet (40 mg total) by mouth daily. 12/03/24   Pearlean Manus, MD  potassium chloride  (KLOR-CON ) 10 MEQ tablet Take 10 mEq by mouth daily. 12/03/24   [provider]  torsemide  (DEMADEX ) 20 MG tablet Take 1 tablet (20 mg total) by mouth 2 (two) times daily. 12/12/24   Bryn Bernardino NOVAK, MD  VASCEPA  1 g capsule Take 2 g by mouth 2 (two) times daily. 03/12/22   [provider]  Vitamin D , Ergocalciferol , (DRISDOL ) 1.25 MG (50000 UNIT) CAPS capsule Take 1 capsule (50,000 Units total) by mouth every 7 (seven) days. 11/10/24   PokhrelVernal, MD     Critical care time: ***              [1]  Allergies Allergen Reactions   Ibuprofen  Other (See Comments)    Avoid per PCP   Oxcarbazepine Other (See Comments)    Patient goes out of right state of mind.    "

## 2024-12-24 NOTE — ED Provider Notes (Signed)
 " [place] EMERGENCY DEPARTMENT AT [facility] Provider Note   CSN: [id number] Arrival date & time: 12/24/24  1642     Patient presents with: No chief complaint on file.   Taylor Obrien is a 59 y.o. male with a history of HFrEF, hypoxic respiratory failure, CKD, CVA, type 2 diabetes presents with shortness of breath that began this afternoon.  Has had multiple recent admissions due to hypertensive emergency with flash pulmonary edema.  NSTEMI during his admission earlier this month.  No fevers.  Endorses cough.  Complains of chest pain,   HPI     Prior to Admission medications  Medication Sig Start Date End Date Taking? Authorizing Provider  acetaminophen  (TYLENOL ) 325 MG tablet Take 2 tablets (650 mg total) by mouth every 6 (six) hours as needed for mild pain (pain score 1-3) or fever (or Fever >/= 101). 12/03/24   Pearlean Manus, MD  aspirin  EC 81 MG tablet Take 1 tablet (81 mg total) by mouth daily with breakfast. 12/03/24 12/03/25  Pearlean Manus, MD  atorvastatin  (LIPITOR ) 80 MG tablet Take 1 tablet (80 mg total) by mouth daily. 12/03/24   Pearlean Manus, MD  calcitRIOL  (ROCALTROL ) 0.25 MCG capsule Take 1 capsule (0.25 mcg total) by mouth every Monday, Wednesday, and Friday. 11/08/24 02/06/25  Pokhrel, Laxman, MD  carvedilol  (COREG ) 25 MG tablet Take 1 tablet (25 mg total) by mouth 2 (two) times daily with a meal. 12/12/24 03/12/25  Bryn Bernardino NOVAK, MD  escitalopram  (LEXAPRO ) 20 MG tablet Take 1 tablet (20 mg total) by mouth daily. 12/03/24   Pearlean Manus, MD  fenofibrate  (TRICOR ) 145 MG tablet Take 145 mg by mouth daily. 04/10/22   [provider]  isosorbide -hydrALAZINE  (BIDIL ) 20-37.5 MG tablet Take 2 tablets by mouth 3 (three) times daily. 12/12/24   Bryn Bernardino NOVAK, MD  lidocaine  (LIDODERM ) 5 % Place 2 patches onto the skin daily. Remove & Discard patch within 12 hours or as directed by MD Patient taking differently: Place 2 patches onto the skin daily as needed  (Pain). Remove & Discard patch within 12 hours or as directed by MD 04/26/24   Donnelly Mellow, MD  loperamide  (IMODIUM ) 2 MG capsule Take 1 capsule (2 mg total) by mouth as needed for diarrhea or loose stools (Loose stools/Diarrhea). 12/03/24   Pearlean Manus, MD  losartan  (COZAAR ) 50 MG tablet Take 1 tablet (50 mg total) by mouth daily. 12/13/24   Bryn Bernardino NOVAK, MD  methocarbamol  (ROBAXIN ) 500 MG tablet Take 500 mg by mouth every 8 (eight) hours as needed. 11/16/24   [provider]  pantoprazole  (PROTONIX ) 40 MG tablet Take 1 tablet (40 mg total) by mouth daily. 12/03/24   Pearlean Manus, MD  potassium chloride  (KLOR-CON ) 10 MEQ tablet Take 10 mEq by mouth daily. 12/03/24   [provider]  torsemide  (DEMADEX ) 20 MG tablet Take 1 tablet (20 mg total) by mouth 2 (two) times daily. 12/12/24   Bryn Bernardino NOVAK, MD  VASCEPA  1 g capsule Take 2 g by mouth 2 (two) times daily. 03/12/22   [provider]  Vitamin D , Ergocalciferol , (DRISDOL ) 1.25 MG (50000 UNIT) CAPS capsule Take 1 capsule (50,000 Units total) by mouth every 7 (seven) days. 11/10/24   Pokhrel, Laxman, MD    Allergies: Ibuprofen  and Oxcarbazepine    Review of Systems  Respiratory:  Positive for shortness of breath.     Updated Vital Signs BP (!) 144/103   Pulse 78   Temp 98.8 F (  37.1 C)   Resp (!) 22   Ht 5' 6 (1.676 m)   Wt 70.2 kg   SpO2 97%   BMI 24.98 kg/m   Physical Exam Vitals and nursing note reviewed.  Constitutional:      General: He is not in acute distress.    Appearance: He is well-developed.  HENT:     Head: Normocephalic and atraumatic.  Eyes:     Conjunctiva/sclera: Conjunctivae normal.  Cardiovascular:     Rate and Rhythm: Normal rate and regular rhythm.     Heart sounds: No murmur heard. Pulmonary:     Comments: Increased work of breathing. Diffuse rales, unable to speak in full sentences  Abdominal:     Palpations: Abdomen is soft.     Tenderness: There is no abdominal  tenderness.  Musculoskeletal:        General: No swelling.     Cervical back: Neck supple.  Skin:    General: Skin is warm and dry.     Capillary Refill: Capillary refill takes less than 2 seconds.  Neurological:     Mental Status: He is alert.  Psychiatric:        Mood and Affect: Mood normal.     (all labs ordered are listed, but only abnormal results are displayed) Labs Reviewed  PRO BRAIN NATRIURETIC PEPTIDE - Abnormal; Notable for the following components:      Result Value   Pro Brain Natriuretic Peptide 9,603.0 (*)    All other components within normal limits  COMPREHENSIVE METABOLIC PANEL WITH GFR - Abnormal; Notable for the following components:   CO2 15 (*)    Glucose, Bld 244 (*)    BUN 29 (*)    Creatinine, Ser 3.77 (*)    AST 67 (*)    GFR, Estimated 18 (*)    Anion gap 23 (*)    All other components within normal limits  BLOOD GAS, VENOUS - Abnormal; Notable for the following components:   pH, Ven 7.09 (*)    pO2, Ven <31 (*)    Bicarbonate 18.7 (*)    Acid-base deficit 12.0 (*)    All other components within normal limits  LACTIC ACID, PLASMA - Abnormal; Notable for the following components:   Lactic Acid, Venous 7.6 (*)    All other components within normal limits  BLOOD GAS, VENOUS - Abnormal; Notable for the following components:   pCO2, Ven 38 (*)    pO2, Ven 85 (*)    Acid-base deficit 4.8 (*)    All other components within normal limits  TROPONIN T, HIGH SENSITIVITY - Abnormal; Notable for the following components:   Troponin T High Sensitivity 426 (*)    All other components within normal limits  TROPONIN T, HIGH SENSITIVITY - Abnormal; Notable for the following components:   Troponin T High Sensitivity 724 (*)    All other components within normal limits  RESP PANEL BY RT-PCR (RSV, FLU A&B, COVID)  RVPGX2  LACTIC ACID, PLASMA  PROTIME-INR  APTT  TSH  URINE DRUG SCREEN  HEPARIN  LEVEL (UNFRACTIONATED)  CBC  I-STAT CHEM 8, ED     EKG: None  Radiology: DG Chest Port 1 View Result Date: 12/24/2024 EXAM: 1 VIEW(S) XRAY OF THE CHEST 12/24/2024 05:20:26 PM COMPARISON: 12/10/2024 CLINICAL HISTORY: sob FINDINGS: LUNGS AND PLEURA: Interstitial prominence throughout the lungs, improved since prior study but likely reflecting residual or recurrent pulmonary edema/chf. No pleural effusion. No pneumothorax. HEART AND MEDIASTINUM: Mild cardiomegaly. BONES AND SOFT  TISSUES: No acute osseous abnormality. IMPRESSION: 1. Interstitial prominence throughout the lungs, improved since prior study, likely reflecting residual or recurrent pulmonary edema/CHF. Electronically signed by: Franky Crease MD 12/24/2024 05:44 PM EST RP Workstation: [id number]     .Critical Care  Performed by: Donnajean Lynwood DEL, PA-C Authorized by: Donnajean Lynwood DEL, PA-C   Critical care provider statement:    Critical care time (minutes):  30   Critical care was necessary to treat or prevent imminent or life-threatening deterioration of the following conditions:  Cardiac failure, respiratory failure and metabolic crisis   Critical care was time spent personally by me on the following activities:  Development of treatment plan with patient or surrogate, discussions with consultants, evaluation of patient's response to treatment, examination of patient, ordering and review of laboratory studies, ordering and review of radiographic studies, ordering and performing treatments and interventions, pulse oximetry, re-evaluation of patient's condition and review of old charts   Care discussed with: admitting provider   Ultrasound ED Thoracic  Date/Time: 12/24/2024 7:27 PM  Performed by: Donnajean Lynwood DEL, PA-C Authorized by: Donnajean Lynwood DEL, PA-C   Procedure details:    Indications: chest pain, dyspnea and hypoxia     Assessment for:  Pleural effusion, pneumothorax, interstitial syndrome and pneumonia   Left lung pleural:  Visualized   Right lung pleural:   Visualized   Images: archived     Limitations:  Body habitus Findings:    B-lines noted throughout: identified   Right Lung Findings:     no right lung sliding Left Lung Findings:     no left lung sliding Impression:    Impression: interstitial syndrome      Medications Ordered in the ED  nitroGLYCERIN  50 mg in dextrose  5 % 250 mL (0.2 mg/mL) infusion (15 mcg/min Intravenous Infusion Verify 12/24/24 2018)  heparin  ADULT infusion 100 units/mL (25000 units/250mL) (850 Units/hr Intravenous New Bag/Given 12/24/24 1934)  furosemide  (LASIX ) injection 80 mg (80 mg Intravenous Given 12/24/24 1721)  metoprolol  tartrate (LOPRESSOR ) 5 MG/5ML injection (5 mg  Given 12/24/24 1730)  labetalol  (NORMODYNE ) injection 40 mg (40 mg Intravenous Given 12/24/24 1729)  fentaNYL  (SUBLIMAZE ) injection 25 mcg (25 mcg Intravenous Given 12/24/24 1903)  heparin  bolus via infusion 4,000 Units (4,000 Units Intravenous Bolus from Bag 12/24/24 1935)  labetalol  (NORMODYNE ) injection 40 mg (40 mg Intravenous Given 12/24/24 1930)  ondansetron  (ZOFRAN ) injection 4 mg (4 mg Intravenous Given 12/24/24 1942)    Clinical Course as of 12/24/24 2054  Fri Dec 24, 2024  1738 Patient with history of HFrEF, recent NSTEMI, CKD, diabetes evaluated for acute shortness of breath that started this afternoon.  Upon arrival patient was initially hypotensive and hypoxic to 76%, tachycardic to 150.  Placed on nasal cannula.  Diffuse Rales on exam.  Upgraded to BiPAP.  Repeat pressures of 260/170. Diffuse B lines on POCUS. Cxr consistent with pulmonary edema and cardiomegaly.  Has had multiple recent admissions for hypertensive emergency with flash pulmonary edema. [JT]  1804 Blood gas, venous (at [facility] and [facility])(!!) pH of 7.09 [JT]  1804 DG Chest Port 1 View Interstitial prominence consistent with pulmonary edema [JT]  1818 Pro Brain natriuretic peptide(!) BNP of 9600 [JT]  1819 Troponin T, High Sensitivity(!!) Elevated 426 [JT]  1819 Comprehensive  metabolic panel(!) Creatinine 3.7, baseline of 4, anion gap of 23 with bicarb of 15 [JT]  1906 Discussed with cardiology.  Recommended discussing patient with interventionalists. [JT]  1910 Discussed patient with interventional list, Dr. Wonda.  Did not  feel that patient was a candidate for cath [JT]  1931 Discussed patient with critical care, Dr. Donzetta agreed for admission [JT]  2052 Patient improved. No long on Bipap. Normotensive, comfortable on 4L. Discussed patient with Dr. Sherlon, agreed for admission to [facility].  [JT]    Clinical Course User Index [JT] Donnajean Lynwood DEL, PA-C                                 Medical Decision Making Amount and/or Complexity of Data Reviewed Labs: ordered. Radiology: ordered.  Risk Prescription drug management.   This patient presents to the ED with chief complaint(s) of Shortness of breath.  The complaint involves an extensive differential diagnosis and also carries with it a high risk of complications and morbidity.   Pertinent past medical history as listed in HPI  The differential diagnosis includes  ACS, PE, CHF exacerbation, pneumonia, pneumothorax, aortic dissection Additional history obtained: Records reviewed Care Everywhere/External Records  Disposition:   Patient admitted to [facility]  Social Determinants of Health:   none  This note was dictated with voice recognition software.  Despite best efforts at proofreading, errors may have occurred which can change the documentation meaning.       Final diagnoses:  Acute decompensated heart failure [facility])    ED Discharge Orders     None          Donnajean Lynwood DEL, PA-C 12/24/24 2054  "

## 2024-12-24 NOTE — ED Triage Notes (Signed)
 Patient arrives POV from home c/c SOB. Patient is dysneic and audibly coarse lung sounds. States he had a heart attack 2 weeks ago here.

## 2024-12-24 NOTE — Consult Note (Signed)
 PHARMACY - ANTICOAGULATION CONSULT NOTE  Pharmacy Consult for heparin  infusion Indication: chest pain/ACS  Allergies[1]  Patient Measurements: Weight = 70.2kg Calculated BMI = 24.9 Heparin  weight = 70.2kg  Vital Signs: BP: 269/172 (01/16 1714) Pulse Rate: 154 (01/16 1700)  Labs: Recent Labs    12/24/24 1712  CREATININE 3.77*    Estimated Creatinine Clearance: 19.7 mL/min (A) (by C-G formula based on SCr of 3.77 mg/dL (H)).   Medical History: Past Medical History:  Diagnosis Date   Acute alcoholic pancreatitis    Arthritis    CHF (congestive heart failure) (HCC)    Chronic pain    Depression    Diabetes mellitus without complication (HCC)    Essential hypertension, benign    GERD (gastroesophageal reflux disease)    Headache    HTN (hypertension) 11/27/2015   Hyperlipidemia    Peripheral vascular disease    Sleep apnea    could not tolerate CPAP   Stroke (HCC)    2016    Medications:  NO AC prior to admission per med list and rx fill history  Assessment: Patient admitted to [facility] for hypertensive crisis and flash pulmonary edema requiring BiPa . PMH includes HFrEF (EF 30-35%), CKD stage IV, prior CVA (with residual left sided hemiparesis, wheelchair bound), PAD and T2DM. Pharmacy consulted to initiate and manage heparin  infusion for ACS.  Baseline CBC from 12/10/2024 shows Hgb and plt  WNL. Baseline INR and aPTT ordered.   Goal of Therapy:  Heparin  level 0.3-0.7 units/ml Monitor platelets by anticoagulation protocol: Yes   Plan:  Give 4000 units bolus x 1 Start heparin  infusion at 850 units/hr Check heparin  level in 8 hours and daily while on heparin  Continue to monitor H&H and platelets  Dayrin Stallone Rodriguez-Guzman PharmD, BCPS 12/24/2024 7:17 PM     [1]  Allergies Allergen Reactions   Ibuprofen  Other (See Comments)    Avoid per PCP   Oxcarbazepine Other (See Comments)    Patient goes out of right state of mind.

## 2024-12-24 NOTE — Progress Notes (Addendum)
 ON-CALL CARDIOLOGY 12/24/24  [name] name: Taylor Obrien.   MRN: [id number].    DOB: 1966-05-05 Primary care provider: Trudy Ave, NP. Primary cardiologist: No recent oupt care - last consult Dr. Mallipeddi   Interaction regarding this patient's care today: Patient presented to ED with Shortness of breath  ED physician called to discuss care   Initial vital signs documented 98/47, 76% on room air, 32 breaths/min, pulse of 154 bpm. Shortly thereafter upon recheck blood pressure 269/172, 98% on 70% FiO2, 38 breaths/min  Initial blood gas notes a pH of 7.09, pO2 <31, pCO2 60% Serum creatinine 3.77, trending lower compared to prior hospitalization earlier this month Elevated proBNP 9603 Initial troponin 426 Chest x-ray findings concerning for congestion/CHF EKG: 151 bpm, sinus tachycardia, LVH, IVCD, diffuse ST-T changes 98 bpm, sinus rhythm, IVCD likely left bundle branch block, diffuse ST-T changes 103 bpm, sinus tachycardia, LVH, subtle ST elevations in lead III and aVF, with diffuse ST-T changes  Impression: Shortness of breath -likely secondary to hypertensive emergency with flash pulmonary edema Hypertensive emergency. Flash pulmonary edema. NSTEMI Cardiomyopathy. Acute decompensated heart failure with reduced EF with chronic kidney disease stage IV  Recommendations: Patient presents with shortness of breath and found to be hypoxic based on ABG placed on CPAP.  Chest x-ray shows pulmonary congestion.  Blood pressures significantly elevated. Currently on nitro drip and heparin  drip, reasonable EKG illustrates note diffuse ST-T changes likely secondary to underlying LVH, cardiomyopathy, acute respiratory distress and hypoxia. Given his young age, his Cr is better now than compared to his prior admissions, troponins are higher, and given the EKG findings recommended that we review the case with interventional cardiology and also present his clinical baseline functional  status.  Angiography in the past was recommended but held off secondary to chronic kidney disease unless needed in an acute setting.  Recommend discussing care with CCM - given the hypertensive emergency with flash pulmonary edema needing noninvasive ventilation, on parenteral drips, and likely needing arterial line. Continue to trend troponins Check UDS and TSH Limited echocardiogram in the morning If clinically indicated based on symptoms consider MRA aorta  Give Lasix  80mg  IVP  Strict I/O Cardiology will see when he comes to [facility].   No charge  Madonna Large, DO, [facility]  A Division of [facility] [address], [place], [place] [address]  12/24/2024 7:26 PM

## 2024-12-24 NOTE — ED Notes (Signed)
 Updated daughter

## 2024-12-24 NOTE — ED Notes (Signed)
 Patient tolerating ice water . Edp ok'd

## 2024-12-24 NOTE — ED Notes (Signed)
 Patient noted in triage to be dyspneic, satting 76% on RA. Placed on [place] and notified charge. Patient brought to room 1.

## 2024-12-25 ENCOUNTER — Encounter (HOSPITAL_COMMUNITY): Payer: Self-pay | Admitting: Internal Medicine

## 2024-12-25 ENCOUNTER — Other Ambulatory Visit: Payer: Self-pay

## 2024-12-25 DIAGNOSIS — I214 Non-ST elevation (NSTEMI) myocardial infarction: Secondary | ICD-10-CM | POA: Diagnosis not present

## 2024-12-25 LAB — URINE DRUG SCREEN
Amphetamines: NEGATIVE
Barbiturates: NEGATIVE
Benzodiazepines: NEGATIVE
Cocaine: NEGATIVE
Fentanyl: POSITIVE — AB
Methadone Scn, Ur: NEGATIVE
Opiates: NEGATIVE
Tetrahydrocannabinol: NEGATIVE

## 2024-12-25 LAB — BASIC METABOLIC PANEL WITH GFR
Anion gap: 13 (ref 5–15)
BUN: 32 mg/dL — ABNORMAL HIGH (ref 6–20)
CO2: 22 mmol/L (ref 22–32)
Calcium: 8.9 mg/dL (ref 8.9–10.3)
Chloride: 106 mmol/L (ref 98–111)
Creatinine, Ser: 3.59 mg/dL — ABNORMAL HIGH (ref 0.61–1.24)
GFR, Estimated: 19 mL/min — ABNORMAL LOW
Glucose, Bld: 107 mg/dL — ABNORMAL HIGH (ref 70–99)
Potassium: 4.8 mmol/L (ref 3.5–5.1)
Sodium: 141 mmol/L (ref 135–145)

## 2024-12-25 LAB — HEPARIN LEVEL (UNFRACTIONATED)
Heparin Unfractionated: 0.51 [IU]/mL (ref 0.30–0.70)
Heparin Unfractionated: 0.71 [IU]/mL — ABNORMAL HIGH (ref 0.30–0.70)

## 2024-12-25 LAB — CBC
HCT: 34.3 % — ABNORMAL LOW (ref 39.0–52.0)
Hemoglobin: 11.3 g/dL — ABNORMAL LOW (ref 13.0–17.0)
MCH: 32.4 pg (ref 26.0–34.0)
MCHC: 32.9 g/dL (ref 30.0–36.0)
MCV: 98.3 fL (ref 80.0–100.0)
Platelets: 238 K/uL (ref 150–400)
RBC: 3.49 MIL/uL — ABNORMAL LOW (ref 4.22–5.81)
RDW: 14 % (ref 11.5–15.5)
WBC: 14.2 K/uL — ABNORMAL HIGH (ref 4.0–10.5)
nRBC: 0 % (ref 0.0–0.2)

## 2024-12-25 LAB — GLUCOSE, CAPILLARY
Glucose-Capillary: 99 mg/dL (ref 70–99)
Glucose-Capillary: 106 mg/dL — ABNORMAL HIGH (ref 70–99)

## 2024-12-25 LAB — CBG MONITORING, ED
Glucose-Capillary: 101 mg/dL — ABNORMAL HIGH (ref 70–99)
Glucose-Capillary: 117 mg/dL — ABNORMAL HIGH (ref 70–99)

## 2024-12-25 MED ORDER — OXYCODONE HCL 5 MG PO TABS: 5.0000 mg | ORAL_TABLET | Freq: Four times a day (QID) | ORAL | Status: DC | PRN

## 2024-12-25 MED ORDER — OXYCODONE HCL 5 MG PO TABS
5.0000 mg | ORAL_TABLET | Freq: Four times a day (QID) | ORAL | Status: DC | PRN
  Administered 2024-12-25 – 2024-12-28 (×8): 5 mg via ORAL
  Filled 2024-12-25 (×8): qty 1

## 2024-12-25 MED ORDER — OXYCODONE HCL 5 MG PO TABS
5.0000 mg | ORAL_TABLET | Freq: Once | ORAL | Status: AC
  Administered 2024-12-25: 5 mg via ORAL
  Filled 2024-12-25: qty 1

## 2024-12-25 NOTE — Progress Notes (Signed)
 " PROGRESS NOTE    Taylor Obrien  FMW:[id number] DOB: 1966/05/16 DOA: 12/24/2024 PCP: Trudy Ave, NP   Brief Narrative:    Taylor Obrien  is a 60 y.o. male, with medical history of HFrEF (EF 30-35%), CKD4 (Cr ~4.0), prior CVA, PAD, and T2DM with recent hospitalization x 3 within the last month, just discharged 1//2026, multiple admissions for hypertensive emergency, NSTEMI due to demand ischemia, requiring nitro drip, aspirin , Plavix  and nitro drip for blood pressure control with multiple adjustment of his medications.  - Patient presents to ED today secondary to dyspnea, chest pain, saturating 76% on room air with increased work of breathing.  He was admitted with NSTEMI in the setting of hypertensive emergency and reports difficulty with compliance on his home medications.  He was noted to have acute hypoxemic respiratory failure with flash pulmonary edema requiring BiPAP initiation as well as nitroglycerin  drip and heparin  drip.  He has also been started on IV Lasix  for diuresis.  Overall, he is much improved with stable blood pressure readings and now has been weaned off of nitroglycerin  drip and has also been taken off of the BiPAP.  He is currently on heparin  drip and awaiting transfer to [facility] for further evaluation by cardiology as requested.  Assessment & Plan:   Principal Problem:   NSTEMI (non-ST elevated myocardial infarction) (HCC) Active Problems:   Mixed hyperlipidemia   Tobacco abuse   Stroke (cerebrum) (HCC)   Stage 3a chronic kidney disease (CKD) (HCC)   Type 2 diabetes mellitus with hyperglycemia (HCC)  Assessment and Plan:   NSTEMI Elevated troponins - Patient with greatly appreciated, concern for NSTEMI, possibly demand ischemia secondary to hypertensive emergency, and respiratory failure. - Continue heparin  drip and transferred to [facility] for further evaluation for cardiology -Limited 2D echocardiogram -Continue IV Lasix  for diuresis    Hypertensive emergency-resolved -Multiple admissions in the past for the same, patient reports he is compliant with medication. - Continue home medications and weaned off of nitroglycerin  drip -Hydralazine  as needed   Acute respiratory failure with hypoxemia secondary to Flash pulmonary edema in the setting of hypertensive emergency -On BiPAP initially, much improved after diuresis and blood pressure control -He is currently on 3 L nasal cannula - Continue IV Lasix  40 mg twice daily for diuresis -Monitor strict I's and O's and daily weights  Acute on chronic HFrEF (EF 30-35%):  Ischemic cardiomyopathy -Give the 80 mg of IV Lasix , more comfortable now, we will continue with IV Lasix  40 mg twice daily and await further recommendations from cardiology. - Resume Coreg , BiDil , aspirin , statin , will hold losartan  for now . Troponin elevation: Suspect demand ischemia (Troponin considerably lower than prior admit). No chest pain or ischemic ECG changes. Troponin has peaked.  - On ASA, high-intensity statin, beta blocker  CKd stage IV  - 3.59, baseline 4.5, creatinine lower than baseline most likely in the setting of volume overload due to CHF   Diabetes mellitus, type II - Appears to be diet controlled, not on any home medication, will keep an extra sliding scale during hospital stay   tobacco abuse -counseled, continue with nicotine  patch   Debility, history of CVA: - Will consult PT, at baseline wheelchair dependent given left lower extremity weakness.    DVT prophylaxis: Heparin  drip Code Status: Full Family Communication: None at bedside Disposition Plan: Transfer to [facility] Status is: Inpatient Remains inpatient appropriate because: Need for IV medications.   Consultants:  Cardiology PCCM  Procedures:  None  Antimicrobials:  None   Subjective: Patient seen and evaluated today with no new acute complaints or concerns.  He has been weaned off of BiPAP  and continues to remain on nitroglycerin  as of this morning, but is weaning off.  Remains on heparin  drip and denies any chest pain or shortness of breath or other complaints.  He is asking for coffee.  Objective: Vitals:   12/25/24 1100 12/25/24 1129 12/25/24 1130 12/25/24 1200  BP: (!) 132/94  119/80 129/79  Pulse: 94  92 96  Resp: 19  20 20   Temp:  98.3 F (36.8 C)    TempSrc:  Oral    SpO2: 95%  93% 95%  Weight:      Height:        Intake/Output Summary (Last 24 hours) at 12/25/2024 1235 Last data filed at 12/25/2024 0838 Gross per 24 hour  Intake 716.06 ml  Output --  Net 716.06 ml   Filed Weights   12/24/24 2036  Weight: 70.2 kg    Examination:  General exam: Appears calm and comfortable  Respiratory system: Clear to auscultation. Respiratory effort normal.  3 L nasal cannula Cardiovascular system: S1 & S2 heard, RRR.  Gastrointestinal system: Abdomen is soft Central nervous system: Alert and awake Extremities: No edema Skin: No significant lesions noted Psychiatry: Flat affect.    Data Reviewed: I have personally reviewed following labs and imaging studies  CBC: Recent Labs  Lab 12/25/24 0511  WBC 14.2*  HGB 11.3*  HCT 34.3*  MCV 98.3  PLT 238   Basic Metabolic Panel: Recent Labs  Lab 12/24/24 1712 12/25/24 0511  NA 143 141  K 4.0 4.8  CL 105 106  CO2 15* 22  GLUCOSE 244* 107*  BUN 29* 32*  CREATININE 3.77* 3.59*  CALCIUM  9.4 8.9   GFR: Estimated Creatinine Clearance: 20.7 mL/min (A) (by C-G formula based on SCr of 3.59 mg/dL (H)). Liver Function Tests: Recent Labs  Lab 12/24/24 1712  AST 67*  ALT 21  ALKPHOS 76  BILITOT 0.3  PROT 7.8  ALBUMIN 3.9   No results for input(s): LIPASE, AMYLASE in the last 168 hours. No results for input(s): AMMONIA in the last 168 hours. Coagulation Profile: Recent Labs  Lab 12/24/24 1922  INR 1.0   Cardiac Enzymes: No results for input(s): CKTOTAL, CKMB, CKMBINDEX, TROPONINI in  the last 168 hours. BNP (last 3 results) Recent Labs    11/25/24 1925 12/10/24 0345 12/24/24 1712  PROBNP 5,832.0* 4,920.0* 9,603.0*   HbA1C: No results for input(s): HGBA1C in the last 72 hours. CBG: Recent Labs  Lab 12/24/24 2311 12/25/24 0751 12/25/24 1134  GLUCAP 106* 101* 117*   Lipid Profile: No results for input(s): CHOL, HDL, LDLCALC, TRIG, CHOLHDL, LDLDIRECT in the last 72 hours. Thyroid Function Tests: Recent Labs    12/24/24 1922  TSH 0.521   Anemia Panel: No results for input(s): VITAMINB12, FOLATE, FERRITIN, TIBC, IRON, RETICCTPCT in the last 72 hours. Sepsis Labs: Recent Labs  Lab 12/24/24 1712 12/24/24 1924  LATICACIDVEN 7.6* 1.5    Recent Results (from the past 240 hours)  Resp panel by RT-PCR (RSV, Flu A&B, Covid) Anterior Nasal Swab     Status: None   Collection Time: 12/24/24  8:55 PM   Specimen: Anterior Nasal Swab  Result Value Ref Range Status   SARS Coronavirus 2 by RT PCR NEGATIVE NEGATIVE Final    Comment: (NOTE) SARS-CoV-2 target nucleic acids are NOT DETECTED.  The SARS-CoV-2  RNA is generally detectable in upper respiratory specimens during the acute phase of infection. The lowest concentration of SARS-CoV-2 viral copies this assay can detect is 138 copies/mL. A negative result does not preclude SARS-Cov-2 infection and should not be used as the sole basis for treatment or other patient management decisions. A negative result may occur with  improper specimen collection/handling, submission of specimen other than nasopharyngeal swab, presence of viral mutation(s) within the areas targeted by this assay, and inadequate number of viral copies(<138 copies/mL). A negative result must be combined with clinical observations, patient history, and epidemiological information. The expected result is Negative.  Fact Sheet for Patients:  bloggercourse.com  Fact Sheet for Healthcare  Providers:  seriousbroker.it  This test is no t yet approved or cleared by the [place]  FDA and  has been authorized for detection and/or diagnosis of SARS-CoV-2 by FDA under an Emergency Use Authorization (EUA). This EUA will remain  in effect (meaning this test can be used) for the duration of the COVID-19 declaration under Section 564(b)(1) of the Act, 21 U.S.C.section 360bbb-3(b)(1), unless the authorization is terminated  or revoked sooner.       Influenza A by PCR NEGATIVE NEGATIVE Final   Influenza B by PCR NEGATIVE NEGATIVE Final    Comment: (NOTE) The Xpert Xpress SARS-CoV-2/FLU/RSV plus assay is intended as an aid in the diagnosis of influenza from Nasopharyngeal swab specimens and should not be used as a sole basis for treatment. Nasal washings and aspirates are unacceptable for Xpert Xpress SARS-CoV-2/FLU/RSV testing.  Fact Sheet for Patients: bloggercourse.com  Fact Sheet for Healthcare Providers: seriousbroker.it  This test is not yet approved or cleared by the [place]  FDA and has been authorized for detection and/or diagnosis of SARS-CoV-2 by FDA under an Emergency Use Authorization (EUA). This EUA will remain in effect (meaning this test can be used) for the duration of the COVID-19 declaration under Section 564(b)(1) of the Act, 21 U.S.C. section 360bbb-3(b)(1), unless the authorization is terminated or revoked.     Resp Syncytial Virus by PCR NEGATIVE NEGATIVE Final    Comment: (NOTE) Fact Sheet for Patients: bloggercourse.com  Fact Sheet for Healthcare Providers: seriousbroker.it  This test is not yet approved or cleared by the [place]  FDA and has been authorized for detection and/or diagnosis of SARS-CoV-2 by FDA under an Emergency Use Authorization (EUA). This EUA will remain in effect (meaning this test can be  used) for the duration of the COVID-19 declaration under Section 564(b)(1) of the Act, 21 U.S.C. section 360bbb-3(b)(1), unless the authorization is terminated or revoked.  Performed at [facility], [address], [place], [place] [address]          Radiology Studies: DG Chest [facility] 1 View Result Date: 12/24/2024 EXAM: 1 VIEW(S) XRAY OF THE CHEST 12/24/2024 05:20:26 PM COMPARISON: 12/10/2024 CLINICAL HISTORY: sob FINDINGS: LUNGS AND PLEURA: Interstitial prominence throughout the lungs, improved since prior study but likely reflecting residual or recurrent pulmonary edema/chf. No pleural effusion. No pneumothorax. HEART AND MEDIASTINUM: Mild cardiomegaly. BONES AND SOFT TISSUES: No acute osseous abnormality. IMPRESSION: 1. Interstitial prominence throughout the lungs, improved since prior study, likely reflecting residual or recurrent pulmonary edema/CHF. Electronically signed by: Franky Crease MD 12/24/2024 05:44 PM EST RP Workstation: [id number]        Scheduled Meds:  aspirin  EC  81 mg Oral Q breakfast   atorvastatin   80 mg Oral Daily   [START ON 12/27/2024] calcitRIOL   0.25 mcg Oral Q M,W,F   carvedilol   25 mg Oral BID WC   escitalopram   20 mg Oral Daily   fenofibrate   54 mg Oral Daily   furosemide   40 mg Intravenous BID   icosapent  Ethyl  2 g Oral BID   insulin  aspart  0-5 Units Subcutaneous QHS   insulin  aspart  0-9 Units Subcutaneous TID WC   isosorbide -hydrALAZINE   2 tablet Oral TID   nicotine   21 mg Transdermal Daily   pantoprazole   40 mg Oral Daily   Continuous Infusions:  heparin  750 Units/hr (12/25/24 1230)   nitroGLYCERIN  Stopped (12/25/24 0838)     LOS: 1 day    Critical care time spent: 50 minutes    Clayton Bosserman JONETTA Fairly, DO [name]  If 7PM-7AM, please contact night-coverage [email or web address] 12/25/2024, 12:35 PM   "

## 2024-12-25 NOTE — Progress Notes (Signed)
 Brief PCCM Progress Note   Notified by primary team at [facility] that patient has stabilized and no longer needs critical care services and or ICU level of care. Decision collectively made to keep patient on hospitalitis service with orders for Progressive care at [facility].  Ayra Hodgdon D. Harris, NP-C [facility] Personal contact information can be found on Amion  If no contact or response made please call [phone] 12/25/2024, 1:43 PM

## 2024-12-25 NOTE — Progress Notes (Addendum)
 PHARMACY - ANTICOAGULATION CONSULT NOTE  Pharmacy Consult for heparin  Indication: chest pain/ACS  Labs: Recent Labs    12/24/24 1712 12/24/24 1922 12/25/24 0511  HGB  --   --  11.3*  HCT  --   --  34.3*  PLT  --   --  238  APTT  --  30  --   LABPROT  --  13.4  --   INR  --  1.0  --   HEPARINUNFRC  --   --  0.51  CREATININE 3.77*  --  3.59*   Assessment/Plan:  59yo male therapeutic on heparin  with initial dosing for CP.   Follow up heparin  level has trended up just above goal at 0.71 on 850 units/hr. No bleeding or IV issues noted.   Plan:  Decrease heparin  to 750 units/hr Recheck heparin  level in am Plan to transfer to [facility] when bed available for cardiology workup  Dempsey Blush PharmD., BCPS Clinical Pharmacist 12/25/2024 7:59 AM

## 2024-12-25 NOTE — ED Notes (Signed)
 Patient c/o physical discomfort in his back due to lying in the bed. Would like something stronger than tylenol  Says he  is a 10/10 in his back

## 2024-12-25 NOTE — Progress Notes (Signed)
 PHARMACY - ANTICOAGULATION CONSULT NOTE  Pharmacy Consult for heparin  Indication: chest pain/ACS  Labs: Recent Labs    12/24/24 1712 12/24/24 1922 12/25/24 0511  HGB  --   --  11.3*  HCT  --   --  34.3*  PLT  --   --  238  APTT  --  30  --   LABPROT  --  13.4  --   INR  --  1.0  --   HEPARINUNFRC  --   --  0.51  CREATININE 3.77*  --   --    Assessment/Plan:  59yo male therapeutic on heparin  with initial dosing for CP. Will continue infusion at current rate of 850 units/hr and confirm stable with additional level.  Marvetta Dauphin, PharmD, BCPS 12/25/2024 5:41 AM

## 2024-12-26 ENCOUNTER — Inpatient Hospital Stay (HOSPITAL_COMMUNITY)

## 2024-12-26 DIAGNOSIS — I5023 Acute on chronic systolic (congestive) heart failure: Secondary | ICD-10-CM

## 2024-12-26 DIAGNOSIS — N184 Chronic kidney disease, stage 4 (severe): Secondary | ICD-10-CM | POA: Diagnosis not present

## 2024-12-26 DIAGNOSIS — J810 Acute pulmonary edema: Secondary | ICD-10-CM

## 2024-12-26 DIAGNOSIS — I509 Heart failure, unspecified: Secondary | ICD-10-CM | POA: Diagnosis not present

## 2024-12-26 DIAGNOSIS — I2489 Other forms of acute ischemic heart disease: Secondary | ICD-10-CM

## 2024-12-26 DIAGNOSIS — I5021 Acute systolic (congestive) heart failure: Secondary | ICD-10-CM

## 2024-12-26 DIAGNOSIS — I214 Non-ST elevation (NSTEMI) myocardial infarction: Secondary | ICD-10-CM | POA: Diagnosis not present

## 2024-12-26 DIAGNOSIS — I161 Hypertensive emergency: Secondary | ICD-10-CM

## 2024-12-26 DIAGNOSIS — J9601 Acute respiratory failure with hypoxia: Secondary | ICD-10-CM

## 2024-12-26 LAB — HEPARIN LEVEL (UNFRACTIONATED): Heparin Unfractionated: 0.4 [IU]/mL (ref 0.30–0.70)

## 2024-12-26 LAB — GLUCOSE, CAPILLARY
Glucose-Capillary: 100 mg/dL — ABNORMAL HIGH (ref 70–99)
Glucose-Capillary: 135 mg/dL — ABNORMAL HIGH (ref 70–99)
Glucose-Capillary: 85 mg/dL (ref 70–99)
Glucose-Capillary: 137 mg/dL — ABNORMAL HIGH (ref 70–99)

## 2024-12-26 LAB — BASIC METABOLIC PANEL WITH GFR
Anion gap: 11 (ref 5–15)
BUN: 35 mg/dL — ABNORMAL HIGH (ref 6–20)
CO2: 24 mmol/L (ref 22–32)
Calcium: 8.5 mg/dL — ABNORMAL LOW (ref 8.9–10.3)
Chloride: 105 mmol/L (ref 98–111)
Creatinine, Ser: 3.93 mg/dL — ABNORMAL HIGH (ref 0.61–1.24)
GFR, Estimated: 17 mL/min — ABNORMAL LOW
Glucose, Bld: 126 mg/dL — ABNORMAL HIGH (ref 70–99)
Potassium: 3.7 mmol/L (ref 3.5–5.1)
Sodium: 140 mmol/L (ref 135–145)

## 2024-12-26 LAB — CBC
HCT: 27.9 % — ABNORMAL LOW (ref 39.0–52.0)
Hemoglobin: 9.4 g/dL — ABNORMAL LOW (ref 13.0–17.0)
MCH: 32.4 pg (ref 26.0–34.0)
MCHC: 33.7 g/dL (ref 30.0–36.0)
MCV: 96.2 fL (ref 80.0–100.0)
Platelets: 195 K/uL (ref 150–400)
RBC: 2.9 MIL/uL — ABNORMAL LOW (ref 4.22–5.81)
RDW: 14.1 % (ref 11.5–15.5)
WBC: 7.4 K/uL (ref 4.0–10.5)
nRBC: 0 % (ref 0.0–0.2)

## 2024-12-26 MED ORDER — SODIUM CHLORIDE 0.9 % IV BOLUS
500.0000 mL | Freq: Once | INTRAVENOUS | Status: AC
  Administered 2024-12-26: 500 mL via INTRAVENOUS

## 2024-12-26 MED ORDER — CARVEDILOL 12.5 MG PO TABS
12.5000 mg | ORAL_TABLET | Freq: Two times a day (BID) | ORAL | Status: DC
  Administered 2024-12-27 – 2024-12-28 (×3): 12.5 mg via ORAL
  Filled 2024-12-26 (×3): qty 1

## 2024-12-26 MED ORDER — ISOSORB DINITRATE-HYDRALAZINE 20-37.5 MG PO TABS
2.0000 | ORAL_TABLET | Freq: Three times a day (TID) | ORAL | Status: DC
  Administered 2024-12-27 – 2024-12-28 (×4): 2 via ORAL
  Filled 2024-12-26 (×4): qty 2

## 2024-12-26 MED ORDER — TORSEMIDE 20 MG PO TABS
40.0000 mg | ORAL_TABLET | Freq: Every day | ORAL | Status: DC
  Administered 2024-12-26 – 2024-12-28 (×3): 40 mg via ORAL
  Filled 2024-12-26 (×3): qty 2

## 2024-12-26 MED ORDER — ONDANSETRON HCL 4 MG/2ML IJ SOLN
4.0000 mg | Freq: Four times a day (QID) | INTRAMUSCULAR | Status: DC | PRN
  Administered 2024-12-26: 4 mg via INTRAVENOUS
  Filled 2024-12-26: qty 2

## 2024-12-26 MED ORDER — HYDRALAZINE HCL 20 MG/ML IJ SOLN: 10.0000 mg | Freq: Four times a day (QID) | INTRAMUSCULAR | Status: DC | PRN

## 2024-12-26 MED ORDER — MECLIZINE HCL 25 MG PO TABS
12.5000 mg | ORAL_TABLET | Freq: Three times a day (TID) | ORAL | Status: DC | PRN
  Administered 2024-12-26: 12.5 mg via ORAL
  Filled 2024-12-26: qty 1

## 2024-12-26 MED ORDER — CLOPIDOGREL BISULFATE 75 MG PO TABS
75.0000 mg | ORAL_TABLET | Freq: Every day | ORAL | Status: DC
  Administered 2024-12-26 – 2024-12-28 (×3): 75 mg via ORAL
  Filled 2024-12-26 (×3): qty 1

## 2024-12-26 NOTE — Progress Notes (Signed)
" °   12/26/24 2200  BiPAP/CPAP/SIPAP  Reason BIPAP/CPAP not in use Non-compliant (Pt. refused.)    "

## 2024-12-26 NOTE — Consult Note (Signed)
 "  Cardiology Consultation   Patient ID: Taylor Obrien MRN: [id number]; DOB: 18-Oct-1966  Admit date: 12/24/2024 Date of Consult: 12/26/2024  PCP:  Trudy Ave, NP   [facility] Providers Cardiologist:  [name], MD     Patient Profile: Taylor Obrien is a 59 y.o. 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  History of Present Illness: Taylor Obrien has had chest pain and shortness of breath over the past 2 weeks which has progressively worsened in severity. He presented to [facility] and was found to be in acute hypoxic respiratory failure 2/2 flash pulmonary edema and hypertensive crisis. He was placed on BiPAP as well as a nitroglycerin  gtt for blood pressure control. He was also given Lasix  80 mg IV and was able to be weaned off BiPAP and the nitroglycerin  gtt. He was started on a heparin  gtt as his troponin was elevated at 426-> 724.   The patient now tells me that his chest pain has resolved. He states that he has been out of his medications for several days as his daughter normally arranges his daily medications, but she has been staying at her mother's house and was unable to do so. The patient lives with his 59 year old son, but the patient does not think his son would be able to help with his medications. The patient states that since his stroke, he has had memory issues and does not remember which medications to take. His daughter typically helps him with his medications.  This is the patient's 3rd admission for hypertensive emergency. He had a newly recognized HF with reduced EF but has not undergone an ischemic evaluation due to his kidney disease. The patient tells me that he has not heard of dialysis before and is not aware of his advanced kidney disease.   On my evaluation, he  is now satting 97% on room air. BP has improved to 120/74, and HR is 97. Cr is 3.6 and acidosis has resolved.   Past Medical History:  Diagnosis Date   Acute alcoholic pancreatitis    Arthritis    CHF (congestive heart failure) (HCC)    Chronic pain    Depression    Diabetes mellitus without complication (HCC)    Essential hypertension, benign    GERD (gastroesophageal reflux disease)    Headache    HTN (hypertension) 11/27/2015   Hyperlipidemia    Peripheral vascular disease    Sleep apnea    could not tolerate CPAP   Stroke (HCC)    2016    Past Surgical History:  Procedure Laterality Date   BIOPSY  08/19/2022   Procedure: BIOPSY;  Surgeon: Cindie Carlin POUR, DO;  Location: [facility];  Service: Endoscopy;;   CLOSED REDUCTION MANDIBULAR FRACTURE W/ ARCH BARS     + multiple extractions   COLONOSCOPY WITH PROPOFOL  N/A 08/19/2022   Procedure: COLONOSCOPY WITH PROPOFOL ;  Surgeon: Cindie Carlin POUR, DO;  Location: [facility];  Service: Endoscopy;  Laterality: N/A;  9:15am   Condyloma resection     ESOPHAGOGASTRODUODENOSCOPY (EGD) WITH PROPOFOL  N/A 08/19/2022   Procedure: ESOPHAGOGASTRODUODENOSCOPY (EGD) WITH PROPOFOL ;  Surgeon: Cindie Carlin POUR, DO;  Location: [facility];  Service: Endoscopy;  Laterality: N/A;   MULTIPLE EXTRACTIONS WITH ALVEOLOPLASTY Bilateral 01/23/2018  Procedure: DENTAL EXTRACTION OF TEETH NUMBER ONE, TWO, THREE, FOUR, FIVE, SIX, SEVEN, EIGHT, NINE, TEN, ELEVEN, TWELVE, THIRTEEN, FOURTEEN, FIFTEEN, SIXTEEN, SEVENTEEN, TWENTY, TWENTY-ONE, TWENTY-TWO, TWENTY-THREE, TWENTY-FOUR, TWENTY-FIVE, TWENTY-SIX, TWENTY-SEVEN, TWENTY-EIGHT, TWENTY-NINE, THIRTY-TWO WITH ALVEOLOPLASTY;  Surgeon: Sheryle Hamilton, DDS;  Location: [facility] OR;  Service: Oral Surgery;  Lat   POLYPECTOMY  08/19/2022   Procedure: POLYPECTOMY;  Surgeon: Cindie Carlin POUR, DO;  Location: [facility];  Service: Endoscopy;;   RADIOLOGY WITH ANESTHESIA N/A 11/18/2015   Procedure: RADIOLOGY WITH  ANESTHESIA;  Surgeon: Thyra Nash, MD;  Location: [facility] OR;  Service: Radiology;  Laterality: N/A;   Removal foreign body right shoulder     Right rotator cuff repair     TEE WITHOUT CARDIOVERSION N/A 11/21/2015   Procedure: TRANSESOPHAGEAL ECHOCARDIOGRAM (TEE);  Surgeon: Ezra GORMAN Shuck, MD;  Location: [facility] ENDOSCOPY;  Service: Cardiovascular;  Laterality: N/A;   TOOTH EXTRACTION N/A 01/24/2018   Procedure: SUTURE ORAL WOUND;  Surgeon: Sheryle Hamilton, DDS;  Location: [facility] OR;  Service: Oral Surgery;  Laterality: N/A;     Home Medications:  Prior to Admission medications  Medication Sig Start Date End Date Taking? Authorizing Provider  acetaminophen  (TYLENOL ) 325 MG tablet Take 2 tablets (650 mg total) by mouth every 6 (six) hours as needed for mild pain (pain score 1-3) or fever (or Fever >/= 101). 12/03/24  Yes Pearlean Manus, MD  aspirin  EC 81 MG tablet Take 1 tablet (81 mg total) by mouth daily with breakfast. 12/03/24 12/03/25 Yes Emokpae, Courage, MD  atorvastatin  (LIPITOR ) 80 MG tablet Take 1 tablet (80 mg total) by mouth daily. 12/03/24  Yes Pearlean Manus, MD  calcitRIOL  (ROCALTROL ) 0.25 MCG capsule Take 1 capsule (0.25 mcg total) by mouth every Monday, Wednesday, and Friday. 11/08/24 02/06/25 Yes Pokhrel, Laxman, MD  carvedilol  (COREG ) 25 MG tablet Take 1 tablet (25 mg total) by mouth 2 (two) times daily with a meal. 12/12/24 03/12/25 Yes Bryn Bernardino NOVAK, MD  escitalopram  (LEXAPRO ) 20 MG tablet Take 1 tablet (20 mg total) by mouth daily. 12/03/24  Yes Emokpae, Courage, MD  fenofibrate  (TRICOR ) 145 MG tablet Take 145 mg by mouth daily. 04/10/22  Yes [provider]  isosorbide -hydrALAZINE  (BIDIL ) 20-37.5 MG tablet Take 2 tablets by mouth 3 (three) times daily. 12/12/24  Yes Bryn Bernardino NOVAK, MD  lidocaine  (LIDODERM ) 5 % Place 2 patches onto the skin daily. Remove & Discard patch within 12 hours or as directed by MD Patient taking differently: Place 2 patches onto the skin daily as needed  (Pain). Remove & Discard patch within 12 hours or as directed by MD 04/26/24  Yes Donnelly Mellow, MD  loperamide  (IMODIUM ) 2 MG capsule Take 1 capsule (2 mg total) by mouth as needed for diarrhea or loose stools (Loose stools/Diarrhea). 12/03/24  Yes Emokpae, Courage, MD  losartan  (COZAAR ) 50 MG tablet Take 1 tablet (50 mg total) by mouth daily. 12/13/24  Yes Bryn Bernardino NOVAK, MD  melatonin 5 MG TABS Take 5 mg by mouth at bedtime.   Yes [provider]  methocarbamol  (ROBAXIN ) 500 MG tablet Take 500 mg by mouth every 8 (eight) hours as needed. 11/16/24  Yes [provider]  pantoprazole  (PROTONIX ) 40 MG tablet Take 1 tablet (40 mg total) by mouth daily. 12/03/24  Yes Emokpae, Courage, MD  potassium chloride  (KLOR-CON ) 10 MEQ tablet Take 10 mEq by mouth daily. 12/03/24  Yes [provider]  torsemide  (DEMADEX ) 20 MG tablet Take 1 tablet (20 mg total) by mouth 2 (two) times daily.  12/12/24  Yes Bryn Bernardino NOVAK, MD  VASCEPA  1 g capsule Take 2 g by mouth 2 (two) times daily. 03/12/22   [provider]    Scheduled Meds:  aspirin  EC  81 mg Oral Q breakfast   atorvastatin   80 mg Oral Daily   [START ON 12/27/2024] calcitRIOL   0.25 mcg Oral Q M,W,F   carvedilol   25 mg Oral BID WC   escitalopram   20 mg Oral Daily   fenofibrate   54 mg Oral Daily   furosemide   40 mg Intravenous BID   icosapent  Ethyl  2 g Oral BID   insulin  aspart  0-5 Units Subcutaneous QHS   insulin  aspart  0-9 Units Subcutaneous TID WC   isosorbide -hydrALAZINE   2 tablet Oral TID   nicotine   21 mg Transdermal Daily   pantoprazole   40 mg Oral Daily   Continuous Infusions:  heparin  750 Units/hr (12/25/24 1828)   nitroGLYCERIN  Stopped (12/25/24 0838)   PRN Meds: acetaminophen  **OR** acetaminophen , oxyCODONE   Allergies:   Allergies[1]  Social History:   Social History   Socioeconomic History   Marital status: Married    Spouse name: Not on file   Number of children: Not on file   Years of education:  Not on file   Highest education level: Not on file  Occupational History   Not on file  Tobacco Use   Smoking status: Every Day    Current packs/day: 0.50    Average packs/day: 0.5 packs/day for 30.0 years (15.0 ttl pk-yrs)    Types: Cigarettes   Smokeless tobacco: Never  Vaping Use   Vaping status: Never Used  Substance and Sexual Activity   Alcohol use: Not Currently    Comment: 2 40 oz daily. 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 Stopped liquor 5 years ago.states no etoh since 03/2022   Drug use: Yes    Types: Marijuana    Comment: occassionally   Sexual activity: Yes    Birth control/protection: None  Other Topics Concern   Not on file  Social History Narrative   Not on file   Social Drivers of Health   Tobacco Use: High Risk (12/10/2024)   Patient History    Smoking Tobacco Use: Every Day    Smokeless Tobacco Use: Never    Passive Exposure: Not on file  Financial Resource Strain: Not on file  Food Insecurity: Food Insecurity Present (12/25/2024)   Epic    Worried About Programme Researcher, Broadcasting/film/video in the Last Year: Often true    Barista in the Last Year: Often true  Transportation Needs: Unmet Transportation Needs (12/25/2024)   Epic    Lack of Transportation (Medical): Yes    Lack of Transportation (Non-Medical): Yes  Physical Activity: Not on file  Stress: Not on file  Social Connections: Unknown (12/10/2024)   Social Connection and Isolation Panel    Frequency of Communication with Friends and Family: Three times a week    Frequency of Social Gatherings with Friends and Family: Once a week    Attends Religious Services: 1 to 4 times per year    Active Member of [employer] or Organizations: No    Attends Banker Meetings: 1 to 4 times per year    Marital Status: Patient declined  Intimate Partner Violence: Not At Risk (12/25/2024)   Epic    Fear of Current or Ex-Partner: No    Emotionally Abused: No    Physically Abused: No    Sexually Abused:  No   Depression (PHQ2-9): Not on file  Alcohol Screen: High Risk (04/21/2024)   Alcohol Screen    Last Alcohol Screening Score (AUDIT): 29  Housing: High Risk (12/25/2024)   Epic    Unable to Pay for Housing in the Last Year: Yes    Number of Times Moved in the Last Year: 0    Homeless in the Last Year: No  Utilities: Not At Risk (12/25/2024)   Epic    Threatened with loss of utilities: No  Recent Concern: Utilities - At Risk (12/10/2024)   Epic    Threatened with loss of utilities: Yes  Health Literacy: Not on file    Family History:   Family History  Problem Relation Age of Onset   Diabetes Mother    Hypertension Mother    Drug abuse Mother    Hyperlipidemia Mother    Diabetes Father    Hypertension Father    Hyperlipidemia Father    Diabetes Brother    Hypertension Brother      ROS:  Please see the history of present illness.  All other ROS reviewed and negative.     Physical Exam/Data: Vitals:   12/25/24 1600 12/25/24 1757 12/25/24 1934 12/25/24 2241  BP: (!) 145/93 (!) 153/93 (!) 131/92 120/74  Pulse: 94 89 87 97  Resp: 17 18 19 19   Temp:  99.2 F (37.3 C) 98.7 F (37.1 C) 98.8 F (37.1 C)  TempSrc:  Oral Oral Oral  SpO2: 96% 99% 97% 100%  Weight:  71.2 kg    Height:        Intake/Output Summary (Last 24 hours) at 12/26/2024 0218 Last data filed at 12/25/2024 2242 Gross per 24 hour  Intake 106.89 ml  Output 400 ml  Net -293.11 ml      12/25/2024    5:57 PM 12/24/2024    8:36 PM 12/12/2024    6:05 AM  Last 3 Weights  Weight (lbs) 156 lb 15.5 oz 154 lb 12.2 oz 154 lb 12.2 oz  Weight (kg) 71.2 kg 70.2 kg 70.2 kg     Body mass index is 25.34 kg/m.  General:  Well nourished, well developed, in no acute distress HEENT: normal Neck: no JVD Vascular: Distal pulses 2+ bilaterally Cardiac:  normal S1, S2; RRR; no murmur Lungs:  clear to auscultation bilaterally, no wheezing, rhonchi or rales  Abd: soft, nontender Ext: no edema Musculoskeletal:  No  deformities, BUE and BLE strength normal and equal Skin: warm and dry  Neuro:  no focal abnormalities noted Psych:  Normal affect   EKG:  The EKG was personally reviewed and demonstrates:  SR, LVH, sutble STE in III, STD avL, nonspecific STT changes with anterior STE consistent with repolarization abnormalities; no STEMI Telemetry:  Telemetry was personally reviewed and demonstrates:  SR with occasional PVCs  Relevant CV Studies: TTE 11/26/2024 1. Limited study.   2. Left ventricular ejection fraction, by estimation, is 30 to 35%. Left  ventricular ejection fraction by 2D MOD biplane is 31.4 %. The left  ventricle has moderately decreased function. The left ventricle  demonstrates regional wall motion abnormalities  (see scoring diagram/findings for description). There is moderate  asymmetric left ventricular hypertrophy of the septal segment. Left  ventricular diastolic function could not be evaluated. The average left  ventricular global longitudinal strain is -12.1  %. The global longitudinal strain is abnormal.   3. Right ventricular systolic function is normal. The right ventricular  size is normal. Tricuspid regurgitation signal  is inadequate for assessing  PA pressure.   4. The mitral valve is grossly normal. Mild mitral valve regurgitation.   5. The aortic valve was not well visualized.   6. The inferior vena cava is normal in size with greater than 50%  respiratory variability, suggesting right atrial pressure of 3 mmHg.   Comparison(s): Prior images reviewed side by side. LVEF 30-35% range at  this point, similar wall motion abnormalties were present on the prior  study as well. Mild mitral regurgitation.   TTE 10/31/2024 IMPRESSIONS   1. Left ventricular ejection fraction, by estimation, is 30%. Left  ventricular ejection fraction by 2D MOD biplane is 29.1 %. The left  ventricle has moderately decreased function. The left ventricle  demonstrates regional wall motion  abnormalities (see  scoring diagram/findings for description). There is moderate asymmetric  left ventricular hypertrophy of the septal segment. Indeterminate  diastolic filling due to E-A fusion.   2. Right ventricular systolic function is normal. The right ventricular  size is normal. Tricuspid regurgitation signal is inadequate for assessing  PA pressure.   3. The mitral valve is degenerative. Mild mitral valve regurgitation. No  evidence of mitral stenosis.   4. The aortic valve was not well visualized. Aortic valve regurgitation  is not visualized. No aortic stenosis is present.   5. The inferior vena cava is normal in size with greater than 50%  respiratory variability, suggesting right atrial pressure of 3 mmHg.   Laboratory Data: High Sensitivity Troponin:  426-> 724 Chemistry Recent Labs  Lab 12/24/24 1712 12/25/24 0511  NA 143 141  K 4.0 4.8  CL 105 106  CO2 15* 22  GLUCOSE 244* 107*  BUN 29* 32*  CREATININE 3.77* 3.59*  CALCIUM  9.4 8.9  GFRNONAA 18* 19*  ANIONGAP 23* 13    Recent Labs  Lab 12/24/24 1712  PROT 7.8  ALBUMIN 3.9  AST 67*  ALT 21  ALKPHOS 76  BILITOT 0.3   Hematology Recent Labs  Lab 12/25/24 0511  WBC 14.2*  RBC 3.49*  HGB 11.3*  HCT 34.3*  MCV 98.3  MCH 32.4  MCHC 32.9  RDW 14.0  PLT 238   Thyroid  Recent Labs  Lab 12/24/24 1922  TSH 0.521    BNP Recent Labs  Lab 12/24/24 1712  PROBNP 9,603.0*     Radiology/Studies:  DG Chest Port 1 View Result Date: 12/24/2024 EXAM: 1 VIEW(S) XRAY OF THE CHEST 12/24/2024 05:20:26 PM COMPARISON: 12/10/2024 CLINICAL HISTORY: sob FINDINGS: LUNGS AND PLEURA: Interstitial prominence throughout the lungs, improved since prior study but likely reflecting residual or recurrent pulmonary edema/chf. No pleural effusion. No pneumothorax. HEART AND MEDIASTINUM: Mild cardiomegaly. BONES AND SOFT TISSUES: No acute osseous abnormality. IMPRESSION: 1. Interstitial prominence throughout the lungs,  improved since prior study, likely reflecting residual or recurrent pulmonary edema/CHF. Electronically signed by: Franky Crease MD 12/24/2024 05:44 PM EST RP Workstation: [id number]   Assessment and Plan: Acute hypoxic respiratory failure  Hypertensive emergency  Flash pulmonary edema  Acute on chronic heart failure exacerbation  NSTEMI HFrEF  Chronic kidney disease  Diabetes mellitus Prior CVA with residual deficits Hypertension Hyperlipidemia Tobacco use disorder The patient presented with flash pulmonary edema in the setting of a hypertensive crisis and HF exacerbation. He initially required positive pressure ventilation and a nitroglycerin  gtt, both of which have been weaned off. He has diuresed nicely with IV Lasix  80mg  then 40 mg bid. The patient also had chest pain with an elevated troponin. This elevated troponin  is likely due to demand ischemia, but it is difficult to rule out a type 1 event. He has newly recognized heart with reduced LVEF but has not had an ischemic evaluation. He has advanced kidney disease, and coronary angiogram was deferred due to risk of accelerating his need for dialysis. Unfortunately, the patient has poor insight into his disease and is not fully aware of his kidney disease. He lives with his son, and his daughter helps him with his medications. I do think an ischemic evaluation would be beneficial, however, we would need to discuss with his family to ensure he has the proper help at home with his medications to avoid unnecessary hospitalizations from missed doses. His daughter lives with her mother, and his son works 5 days a week. If dialysis is needed, then he would likely need additional assistance going to and from his HD center. Unfortunately, no family is here at bedside tonight. I think it would be beneficial to have his family here prior to the decision for coronary angiogram (so that if PCI is indicated, he can have someone help him take his DAPT, along with  his other medications). - Limited TTE in AM to assess function  - Continue heparin  gtt - Continue aspirin   - Continue Lasix  40 mg IV bid, can likely transition to PO tomorrow afternoon (torsemide  20 mg bid) as he approaches euvolemia - Resume home blood pressure medications - Trend troponin to peak - Will need conversation with family regarding risks of coronary angiogram in the setting of CKD  - Smoking cessation   Risk Assessment/Risk Scores: TIMI Risk Score for Unstable Angina or Non-ST Elevation MI:   The patient's TIMI risk score is 5, which indicates a 26% risk of all cause mortality, new or recurrent myocardial infarction or need for urgent revascularization in the next 14 days.  [place]  Heart Association (NYHA) Functional Class NYHA Class III   For questions or updates, please contact [facility] Please consult [email or web address] for contact info under   Signed, Jerrell DELENA Orchard, MD  12/26/2024 2:18 AM      [1]  Allergies Allergen Reactions   Ibuprofen  Other (See Comments)    Avoid per PCP   Oxcarbazepine Other (See Comments)    Patient goes out of right state of mind.    "

## 2024-12-26 NOTE — Progress Notes (Signed)
 " Cardiology Progress Note  Patient ID: Taylor Obrien MRN: [id number] DOB: 1966-08-11 Date of Encounter: 12/26/2024 Primary Cardiologist: [name], MD  Subjective   Chief Complaint: None.   HPI: Admitted with hypertensive crisis and pulmonary edema.  Troponins were elevated likely secondary process.  Overall he seems to be much improved.  Denies any chest pain or trouble breathing on my examination.  ROS:  All other ROS reviewed and negative. Pertinent positives noted in the HPI.     Telemetry  Overnight telemetry shows sinus rhythm 90s, which I personally reviewed.   ECG  The most recent ECG shows normal sinus rhythm heart rate 82, LVH, which I personally reviewed.   Physical Exam   Vitals:   12/25/24 1934 12/25/24 2241 12/26/24 0342 12/26/24 0719  BP: (!) 131/92 120/74 (!) 152/91 (!) 149/94  Pulse: 87 97 85 84  Resp: 19 19 18 20   Temp: 98.7 F (37.1 C) 98.8 F (37.1 C) 98.7 F (37.1 C) 98.5 F (36.9 C)  TempSrc: Oral Oral Oral Oral  SpO2: 97% 100% 98% 96%  Weight:   71.2 kg   Height:        Intake/Output Summary (Last 24 hours) at 12/26/2024 1006 Last data filed at 12/26/2024 0850 Gross per 24 hour  Intake 657.37 ml  Output 1150 ml  Net -492.63 ml       12/26/2024    3:42 AM 12/25/2024    5:57 PM 12/24/2024    8:36 PM  Last 3 Weights  Weight (lbs) 156 lb 15.5 oz 156 lb 15.5 oz 154 lb 12.2 oz  Weight (kg) 71.2 kg 71.2 kg 70.2 kg    Body mass index is 25.34 kg/m.  General: Well nourished, well developed, in no acute distress Head: Atraumatic, normal size  Eyes: PEERLA, EOMI  Neck: Supple, no JVD Endocrine: No thryomegaly Cardiac: Normal S1, S2; RRR; no murmurs, rubs, or gallops Lungs: Clear to auscultation bilaterally, no wheezing, rhonchi or rales  Abd: Soft, nontender, no hepatomegaly  Ext: No edema, pulses 2+ Musculoskeletal: No deformities, BUE and BLE strength normal and equal Skin: Warm and dry, no rashes   Neuro: Alert and oriented to person,  place, time, and situation, CNII-XII grossly intact, no focal deficits  Psych: Normal mood and affect   Cardiac Studies  TTE 11/26/2024  1. Limited study.   2. Left ventricular ejection fraction, by estimation, is 30 to 35%. Left  ventricular ejection fraction by 2D MOD biplane is 31.4 %. The left  ventricle has moderately decreased function. The left ventricle  demonstrates regional wall motion abnormalities  (see scoring diagram/findings for description). There is moderate  asymmetric left ventricular hypertrophy of the septal segment. Left  ventricular diastolic function could not be evaluated. The average left  ventricular global longitudinal strain is -12.1  %. The global longitudinal strain is abnormal.   3. Right ventricular systolic function is normal. The right ventricular  size is normal. Tricuspid regurgitation signal is inadequate for assessing  PA pressure.   4. The mitral valve is grossly normal. Mild mitral valve regurgitation.   5. The aortic valve was not well visualized.   6. The inferior vena cava is normal in size with greater than 50%  respiratory variability, suggesting right atrial pressure of 3 mmHg.    Patient Profile  Taylor Obrien is a 59 y.o. male with systolic heart failure (EF 30-35%), CVA, diabetes, hypertension, CKD 4 admitted on 12/24/2024 with hypertensive crisis and pulmonary edema.  Major issue  is medication noncompliance.  Cardiology consulted for elevated troponins.  Assessment & Plan   # Acute systolic heart failure, EF 30-35% # HTN - Admitted with hypertensive crisis.  Has a known history of systolic dysfunction.  Noncompliant with medications which is problematic. - He tells me he misses medications often.  He is unsure what he is taking. - Overall given advanced kidney disease we will treat this medically.  He is limited options.  Continue Coreg  25 mg twice daily and BiDil  20-37.5 mg 2 tab TID - No need for further IV diuresis.  Transition to  torsemide  40 mg daily.  He is euvolemic on exam to me.  # Elevated troponin, demand ischemia - Suspect this is all demand.  He complained of chest pain when his blood pressure was severely elevated.  BP is much improved.  He is without complaints currently.  Given his medication noncompliance I have recommended medical management.  Complete 48 hours of heparin .  Add Plavix  75 mg daily.  Continue aspirin  81 mg daily. - He is on Lipitor  80 mg daily, Vascepa  and fenofibrate .  Recheck lipids tomorrow. - Overall I believe he is a poor candidate for invasive coronary angiography.  If we pursued a heart catheterization he will be very high risk to not take his medications.  I believe stent thrombosis would be more catastrophic than treating this medically.  For now plan is medical.  # CKD IV - Seems somewhat stable.  # Anemia - Related to kidney disease.  # Acute hypoxic respiratory failure - Secondary to pulmonary edema.  Much improved.     For questions or updates, please contact [facility] Please consult [email or web address] for contact info under        Signed, Darryle T. Barbaraann, MD, [facility]  [facility]  12/26/2024 10:06 AM   "

## 2024-12-26 NOTE — Evaluation (Signed)
 Physical Therapy Evaluation Patient Details Name: Taylor Obrien MRN: [id number] DOB: 1966/11/27 Today's Date: 12/26/2024  History of Present Illness  The pt is a 59 yo male presenting 1/16 with SOB. Work up revealed HTN emergency with flash pulmonary edema, NSTEMI, and acute CHF exacerbation. PMH includes: alcoholic pancreatitis, arthritis, HLD, HFrEF, hypoxic resp failure, CKD IV, CVA, DM II, NSTEMI.   Clinical Impression  Pt in bed upon arrival of PT, agreeable to evaluation at this time. Prior to admission the pt was completing transfers to and from his [facility] without assistance, reports single fall in last 6 months when attempting to put his WC into his car. The pt reports independence with ADLs, but needs assistance with IADLs and medication management. The pt lives with his son who can assist with transfers as needed, but is unable to assist with medications as he is illiterate. The pt required minA to complete squat-pivot transfer to recliner, demos good ability to manage [name] placement with UE, but poor power in RLE and ROM at hips to tolerate full stand. Pt reports he is hopeful to progress standing balance to allow him to prep meals and use microwave at home more safely. Will continue to follow acutely, recommend continued skilled PT after d/c to progress transfer ability and balance to reduce risk of falls.     If plan is discharge home, recommend the following: A little help with walking and/or transfers;A little help with bathing/dressing/bathroom;Assistance with cooking/housework;Direct supervision/assist for medications management;Assist for transportation   Can travel by private vehicle        Equipment Recommendations Wheelchair (measurements PT);Wheelchair cushion (measurements PT)  Recommendations for Other Services       Functional Status Assessment Patient has had a recent decline in their functional status and demonstrates the ability to make significant improvements in  function in a reasonable and predictable amount of time.     Precautions / Restrictions Precautions Precautions: Fall Recall of Precautions/Restrictions: Intact Restrictions Weight Bearing Restrictions Per Provider Order: No         Mobility  Bed Mobility Overal bed mobility: Needs Assistance Bed Mobility: Supine to Sit     Supine to sit: Supervision     General bed mobility comments: good stability without assist, did use bed rails to complete    Transfers Overall transfer level: Needs assistance Equipment used: None Transfers: Bed to chair/wheelchair/BSC       Squat pivot transfers: Min assist     General transfer comment: minA to steady during squat pivot, increased attempts to complete power up and hip lift for transfer. able to complete with UE support on bed/armrests, able to complete pivot without moving armrest of chair    Ambulation/Gait               General Gait Details: pt reports non-ambulatory at baseline, but wants to work on gait and standing in future sessions     Balance Overall balance assessment: Needs assistance, History of Falls Sitting-balance support: No upper extremity supported, Feet supported Sitting balance-Leahy Scale: Fair     Standing balance support: Bilateral upper extremity supported, During functional activity Standing balance-Leahy Scale: Poor Standing balance comment: unable to achieve full stand, dependent on UE support and minA                             Pertinent Vitals/Pain Pain Assessment Pain Assessment: No/denies pain    Home Living Family/patient expects to be discharged  to:: Private residence Living Arrangements: Children (son) Available Help at Discharge: Available PRN/intermittently (son works 5PM to close at [employer], assists pt during the day) Type of Home: Apartment Home Access: Ramped entrance       Home Layout: One level Home Equipment: Wheelchair - manual Additional  Comments: likes to rap, concern about children having the time and ability to care for him like he needs    Prior Function Prior Level of Function : Needs assist             Mobility Comments: Pt reports he's non-ambulatory, transfers to/from w/c without assistance, goes to the nearest store in his w/c. single fall in last 6 months trying to get WC into car after driving to a store ADLs Comments: sink bathes, independent with ADLs, cooks food in microwave, needs assist for medications but son cannot assist     Extremity/Trunk Assessment   Upper Extremity Assessment Upper Extremity Assessment: Overall WFL for tasks assessed    Lower Extremity Assessment Lower Extremity Assessment: [name] deficits/detail [name] Deficits / Details: chronic [name] weakness after stroke in 2016    Cervical / Trunk Assessment Cervical / Trunk Assessment: Kyphotic  Communication   Communication Communication: Impaired Factors Affecting Communication: Reduced clarity of speech    Cognition Arousal: Alert Behavior During Therapy: WFL for tasks assessed/performed   PT - Cognitive impairments: No family/caregiver present to determine baseline, Problem solving, Safety/Judgement                       PT - Cognition Comments: pt benefits from cues for safety, but is able to answer questions about mobility at baseline Following commands: Intact       Cueing Cueing Techniques: Verbal cues     General Comments General comments (skin integrity, edema, etc.): VSS on RA    Exercises     Assessment/Plan    PT Assessment Patient needs continued PT services  PT Problem List Decreased strength;Decreased activity tolerance;Decreased balance;Decreased mobility;Decreased range of motion       PT Treatment Interventions DME instruction;Gait training;Stair training;Functional mobility training;Therapeutic activities;Therapeutic exercise;Balance training;Patient/family education    PT Goals (Current  goals can be found in the Care Plan section)  Acute Rehab PT Goals Patient Stated Goal: to return to walking PT Goal Formulation: With patient Time For Goal Achievement: 01/09/25 Potential to Achieve Goals: Fair    Frequency Min 2X/week        AM-PAC PT 6 Clicks Mobility  Outcome Measure Help needed turning from your back to your side while in a flat bed without using bedrails?: None Help needed moving from lying on your back to sitting on the side of a flat bed without using bedrails?: None Help needed moving to and from a bed to a chair (including a wheelchair)?: A Little Help needed standing up from a chair using your arms (e.g., wheelchair or bedside chair)?: A Little Help needed to walk in hospital room?: Total Help needed climbing 3-5 steps with a railing? : Total 6 Click Score: 16    End of Session Equipment Utilized During Treatment: Gait belt Activity Tolerance: Patient tolerated treatment well Patient left: in chair;with call bell/phone within reach Nurse Communication: Mobility status PT Visit Diagnosis: Unsteadiness on feet (R26.81);Muscle weakness (generalized) (M62.81);Difficulty in walking, not elsewhere classified (R26.2)    Time: 8488-8462 PT Time Calculation (min) (ACUTE ONLY): 26 min   Charges:   PT Evaluation $PT Eval Low Complexity: 1 Low PT Treatments $Therapeutic  Activity: 8-22 mins PT General Charges $$ ACUTE PT VISIT: 1 Visit         Izetta Call, PT, DPT   Acute Rehabilitation Department Office [phone] Secure Chat Communication Preferred  Izetta JULIANNA Call 12/26/2024, 4:30 PM

## 2024-12-26 NOTE — Plan of Care (Signed)

## 2024-12-26 NOTE — Progress Notes (Signed)
 " PROGRESS NOTE    Taylor Obrien  FMW:[id number] DOB: 14-Jan-1966 DOA: 12/24/2024 PCP: Trudy Ave, NP   Brief Narrative:    Taylor Obrien  is a 59 y.o. male, with medical history of HFrEF (EF 30-35%), CKD4 (Cr ~4.0), prior CVA, PAD, and T2DM with recent hospitalization x 3 within the last month, just discharged 1//2026, multiple admissions for hypertensive emergency, NSTEMI due to demand ischemia, requiring nitro drip, aspirin , Plavix  and nitro drip for blood pressure control with multiple adjustment of his medications.  Presented again with dyspnea chest pain and low oxygen  saturations.  Patient was hospitalized for further management.  Initially required BiPAP along with nitroglycerin  and heparin  infusion.  Was slowly weaned off of BiPAP.  Transferred to [facility] for further workup.  Assessment & Plan:   NSTEMI Elevated troponins Patient presented with pulmonary edema, concern for NSTEMI, possibly demand ischemia secondary to hypertensive emergency, and respiratory failure. Continue with heparin  infusion. Patient seen by cardiology.  Discussions ongoing regarding further workup.  Consideration being given to coronary angiogram though he does have CKD and carries a high risk of acute kidney injury.   Continue aspirin  statin and beta-blocker.   Hypertensive emergency-resolved Mentioned that he is compliant with his medications though he has had several admissions with similar presentation. Nitroglycerin  infusion has been weaned off. Patient noted to be on carvedilol  and BiDil . Monitor blood pressures closely.   Acute respiratory failure with hypoxemia This is secondary to pulmonary edema from acute CHF and elevated blood pressure. Initially required BiPAP.  Has been weaned off of it.  Currently saturating normal on room air.  Acute on chronic HFrEF (EF 30-35%):  Ischemic cardiomyopathy Noted to be on furosemide  40 mg twice a day. Cardiology is following. On BiDil .  On  carvedilol .  CKd stage IV  Baseline creatinine around 4.5.  Creatinine is close to baseline.  Continue to monitor urine output.  Avoid nephrotoxic agents.  Patient mentions that he is not followed by nephrology.   Diabetes mellitus, type II Appears to be diet controlled, not on any home medication, will keep an extra sliding scale during hospital stay  Normocytic anemia Drop in hemoglobin is likely due to fluid shift.  No evidence of overt bleeding.  Continue to monitor closely.   Tobacco abuse Counseled, continue with nicotine  patch   Debility, history of CVA: Will consult PT, at baseline wheelchair dependent given left lower extremity weakness.    DVT prophylaxis: Heparin  drip Code Status: Full code Family Communication: None at bedside Disposition Plan: Home when improved   Consultants:  Cardiology PCCM  Procedures:  None  Antimicrobials:  None   Subjective: Patient lying on the bed.  Denies any chest pain shortness of breath.  No nausea or vomiting this morning.  Wondering when he can go home.    Objective: Vitals:   12/25/24 1934 12/25/24 2241 12/26/24 0342 12/26/24 0719  BP: (!) 131/92 120/74 (!) 152/91 (!) 149/94  Pulse: 87 97 85 84  Resp: 19 19 18 20   Temp: 98.7 F (37.1 C) 98.8 F (37.1 C) 98.7 F (37.1 C) 98.5 F (36.9 C)  TempSrc: Oral Oral Oral Oral  SpO2: 97% 100% 98% 96%  Weight:   71.2 kg   Height:        Intake/Output Summary (Last 24 hours) at 12/26/2024 0943 Last data filed at 12/26/2024 0850 Gross per 24 hour  Intake 657.37 ml  Output 1150 ml  Net -492.63 ml   [employer]  12/24/24 2036 12/25/24 1757 12/26/24 0342  Weight: 70.2 kg 71.2 kg 71.2 kg    Examination:  General appearance: Awake alert.  In no distress Resp: Clear to auscultation bilaterally.  Normal effort Cardio: S1-S2 is normal regular.  No S3-S4.  No rubs murmurs or bruit GI: Abdomen is soft.  Nontender nondistended.  Bowel sounds are present normal.  No masses  organomegaly Extremities: Mild edema bilateral lower extremities. No focal neurological deficits.    Data Reviewed: I have personally reviewed following labs and imaging studies  CBC: Recent Labs  Lab 12/25/24 0511 12/26/24 0243  WBC 14.2* 7.4  HGB 11.3* 9.4*  HCT 34.3* 27.9*  MCV 98.3 96.2  PLT 238 195   Basic Metabolic Panel: Recent Labs  Lab 12/24/24 1712 12/25/24 0511 12/26/24 0243  NA 143 141 140  K 4.0 4.8 3.7  CL 105 106 105  CO2 15* 22 24  GLUCOSE 244* 107* 126*  BUN 29* 32* 35*  CREATININE 3.77* 3.59* 3.93*  CALCIUM  9.4 8.9 8.5*   GFR: Estimated Creatinine Clearance: 18.9 mL/min (A) (by C-G formula based on SCr of 3.93 mg/dL (H)). Liver Function Tests: Recent Labs  Lab 12/24/24 1712  AST 67*  ALT 21  ALKPHOS 76  BILITOT 0.3  PROT 7.8  ALBUMIN 3.9   Coagulation Profile: Recent Labs  Lab 12/24/24 1922  INR 1.0    BNP (last 3 results) Recent Labs    11/25/24 1925 12/10/24 0345 12/24/24 1712  PROBNP 5,832.0* 4,920.0* 9,603.0*   CBG: Recent Labs  Lab 12/25/24 0751 12/25/24 1134 12/25/24 1955 12/25/24 2100 12/26/24 0609  GLUCAP 101* 117* 99 106* 100*    Thyroid Function Tests: Recent Labs    12/24/24 1922  TSH 0.521   Sepsis Labs: Recent Labs  Lab 12/24/24 1712 12/24/24 1924  LATICACIDVEN 7.6* 1.5    Recent Results (from the past 240 hours)  Resp panel by RT-PCR (RSV, Flu A&B, Covid) Anterior Nasal Swab     Status: None   Collection Time: 12/24/24  8:55 PM   Specimen: Anterior Nasal Swab  Result Value Ref Range Status   SARS Coronavirus 2 by RT PCR NEGATIVE NEGATIVE Final    Comment: (NOTE) SARS-CoV-2 target nucleic acids are NOT DETECTED.  The SARS-CoV-2 RNA is generally detectable in upper respiratory specimens during the acute phase of infection. The lowest concentration of SARS-CoV-2 viral copies this assay can detect is 138 copies/mL. A negative result does not preclude SARS-Cov-2 infection and should not be  used as the sole basis for treatment or other patient management decisions. A negative result may occur with  improper specimen collection/handling, submission of specimen other than nasopharyngeal swab, presence of viral mutation(s) within the areas targeted by this assay, and inadequate number of viral copies(<138 copies/mL). A negative result must be combined with clinical observations, patient history, and epidemiological information. The expected result is Negative.  Fact Sheet for Patients:  bloggercourse.com  Fact Sheet for Healthcare Providers:  seriousbroker.it  This test is no t yet approved or cleared by the [place]  FDA and  has been authorized for detection and/or diagnosis of SARS-CoV-2 by FDA under an Emergency Use Authorization (EUA). This EUA will remain  in effect (meaning this test can be used) for the duration of the COVID-19 declaration under Section 564(b)(1) of the Act, 21 U.S.C.section 360bbb-3(b)(1), unless the authorization is terminated  or revoked sooner.       Influenza A by PCR NEGATIVE NEGATIVE Final   Influenza B by  PCR NEGATIVE NEGATIVE Final    Comment: (NOTE) The Xpert Xpress SARS-CoV-2/FLU/RSV plus assay is intended as an aid in the diagnosis of influenza from Nasopharyngeal swab specimens and should not be used as a sole basis for treatment. Nasal washings and aspirates are unacceptable for Xpert Xpress SARS-CoV-2/FLU/RSV testing.  Fact Sheet for Patients: bloggercourse.com  Fact Sheet for Healthcare Providers: seriousbroker.it  This test is not yet approved or cleared by the [place]  FDA and has been authorized for detection and/or diagnosis of SARS-CoV-2 by FDA under an Emergency Use Authorization (EUA). This EUA will remain in effect (meaning this test can be used) for the duration of the COVID-19 declaration under Section  564(b)(1) of the Act, 21 U.S.C. section 360bbb-3(b)(1), unless the authorization is terminated or revoked.     Resp Syncytial Virus by PCR NEGATIVE NEGATIVE Final    Comment: (NOTE) Fact Sheet for Patients: bloggercourse.com  Fact Sheet for Healthcare Providers: seriousbroker.it  This test is not yet approved or cleared by the [place]  FDA and has been authorized for detection and/or diagnosis of SARS-CoV-2 by FDA under an Emergency Use Authorization (EUA). This EUA will remain in effect (meaning this test can be used) for the duration of the COVID-19 declaration under Section 564(b)(1) of the Act, 21 U.S.C. section 360bbb-3(b)(1), unless the authorization is terminated or revoked.  Performed at [facility], [address] [place], [place] [address]      Radiology Studies: DG Chest [facility] 1 View Result Date: 12/24/2024 EXAM: 1 VIEW(S) XRAY OF THE CHEST 12/24/2024 05:20:26 PM COMPARISON: 12/10/2024 CLINICAL HISTORY: sob FINDINGS: LUNGS AND PLEURA: Interstitial prominence throughout the lungs, improved since prior study but likely reflecting residual or recurrent pulmonary edema/chf. No pleural effusion. No pneumothorax. HEART AND MEDIASTINUM: Mild cardiomegaly. BONES AND SOFT TISSUES: No acute osseous abnormality. IMPRESSION: 1. Interstitial prominence throughout the lungs, improved since prior study, likely reflecting residual or recurrent pulmonary edema/CHF. Electronically signed by: Franky Crease MD 12/24/2024 05:44 PM EST RP Workstation: [id number]    Scheduled Meds:  aspirin  EC  81 mg Oral Q breakfast   atorvastatin   80 mg Oral Daily   [START ON 12/27/2024] calcitRIOL   0.25 mcg Oral Q M,W,F   carvedilol   25 mg Oral BID WC   escitalopram   20 mg Oral Daily   fenofibrate   54 mg Oral Daily   furosemide   40 mg Intravenous BID   icosapent  Ethyl  2 g Oral BID   insulin  aspart  0-5 Units Subcutaneous QHS   insulin  aspart  0-9  Units Subcutaneous TID WC   isosorbide -hydrALAZINE   2 tablet Oral TID   nicotine   21 mg Transdermal Daily   pantoprazole   40 mg Oral Daily   Continuous Infusions:  heparin  750 Units/hr (12/25/24 1828)   nitroGLYCERIN  Stopped (12/25/24 0838)     LOS: 2 days    Joette Pebbles,  [name]  If 7PM-7AM, please contact night-coverage [email or web address] 12/26/2024, 9:43 AM   "

## 2024-12-26 NOTE — Progress Notes (Signed)
 PHARMACY - ANTICOAGULATION CONSULT NOTE  Pharmacy Consult for heparin  Indication: chest pain/ACS  Allergies[1]  Patient Measurements: Height: 5' 6 (167.6 cm) Weight: 71.2 kg (156 lb 15.5 oz) IBW/kg (Calculated) : 63.8 HEPARIN  DW (KG): 70.2  Vital Signs: Temp: 98.7 F (37.1 C) (01/18 0342) Temp Source: Oral (01/18 0342) BP: 152/91 (01/18 0342) Pulse Rate: 85 (01/18 0342)  Labs: Recent Labs    12/24/24 1712 12/24/24 1922 12/25/24 0511 12/25/24 1128 12/26/24 0243  HGB  --   --  11.3*  --  9.4*  HCT  --   --  34.3*  --  27.9*  PLT  --   --  238  --  195  APTT  --  30  --   --   --   LABPROT  --  13.4  --   --   --   INR  --  1.0  --   --   --   HEPARINUNFRC  --   --  0.51 0.71* 0.40  CREATININE 3.77*  --  3.59*  --  3.93*    Estimated Creatinine Clearance: 18.9 mL/min (A) (by C-G formula based on SCr of 3.93 mg/dL (H)).   Medical History: Past Medical History:  Diagnosis Date   Acute alcoholic pancreatitis    Arthritis    CHF (congestive heart failure) (HCC)    Chronic pain    Depression    Diabetes mellitus without complication (HCC)    Essential hypertension, benign    GERD (gastroesophageal reflux disease)    Headache    HTN (hypertension) 11/27/2015   Hyperlipidemia    Peripheral vascular disease    Sleep apnea    could not tolerate CPAP   Stroke (HCC)    2016    Medications:  Medications Prior to Admission  Medication Sig Dispense Refill Last Dose/Taking   acetaminophen  (TYLENOL ) 325 MG tablet Take 2 tablets (650 mg total) by mouth every 6 (six) hours as needed for mild pain (pain score 1-3) or fever (or Fever >/= 101).   11/20/2024   aspirin  EC 81 MG tablet Take 1 tablet (81 mg total) by mouth daily with breakfast. 30 tablet 2 12/23/2024   atorvastatin  (LIPITOR ) 80 MG tablet Take 1 tablet (80 mg total) by mouth daily. 30 tablet 11 12/23/2024   calcitRIOL  (ROCALTROL ) 0.25 MCG capsule Take 1 capsule (0.25 mcg total) by mouth every Monday, Wednesday,  and Friday. 36 capsule 0 12/22/2024   carvedilol  (COREG ) 25 MG tablet Take 1 tablet (25 mg total) by mouth 2 (two) times daily with a meal. 60 tablet 0 12/23/2024   escitalopram  (LEXAPRO ) 20 MG tablet Take 1 tablet (20 mg total) by mouth daily. 30 tablet 11 12/23/2024   fenofibrate  (TRICOR ) 145 MG tablet Take 145 mg by mouth daily.   12/23/2024   isosorbide -hydrALAZINE  (BIDIL ) 20-37.5 MG tablet Take 2 tablets by mouth 3 (three) times daily. 180 tablet 0 12/23/2024   lidocaine  (LIDODERM ) 5 % Place 2 patches onto the skin daily. Remove & Discard patch within 12 hours or as directed by MD (Patient taking differently: Place 2 patches onto the skin daily as needed (Pain). Remove & Discard patch within 12 hours or as directed by MD) 30 patch 0 Past Week   loperamide  (IMODIUM ) 2 MG capsule Take 1 capsule (2 mg total) by mouth as needed for diarrhea or loose stools (Loose stools/Diarrhea). 30 capsule 3 Past Week   losartan  (COZAAR ) 50 MG tablet Take 1 tablet (50 mg total) by mouth  daily. 30 tablet 0 12/23/2024   melatonin 5 MG TABS Take 5 mg by mouth at bedtime.   12/23/2024   methocarbamol  (ROBAXIN ) 500 MG tablet Take 500 mg by mouth every 8 (eight) hours as needed.   Past Week   pantoprazole  (PROTONIX ) 40 MG tablet Take 1 tablet (40 mg total) by mouth daily. 30 tablet 4 12/23/2024   potassium chloride  (KLOR-CON ) 10 MEQ tablet Take 10 mEq by mouth daily.   12/23/2024   torsemide  (DEMADEX ) 20 MG tablet Take 1 tablet (20 mg total) by mouth 2 (two) times daily. 60 tablet 0 12/23/2024   VASCEPA  1 g capsule Take 2 g by mouth 2 (two) times daily.   12/23/2024   Scheduled:   aspirin  EC  81 mg Oral Q breakfast   atorvastatin   80 mg Oral Daily   [START ON 12/27/2024] calcitRIOL   0.25 mcg Oral Q M,W,F   carvedilol   25 mg Oral BID WC   escitalopram   20 mg Oral Daily   fenofibrate   54 mg Oral Daily   furosemide   40 mg Intravenous BID   icosapent  Ethyl  2 g Oral BID   insulin  aspart  0-5 Units Subcutaneous QHS   insulin   aspart  0-9 Units Subcutaneous TID WC   isosorbide -hydrALAZINE   2 tablet Oral TID   nicotine   21 mg Transdermal Daily   pantoprazole   40 mg Oral Daily   Infusions:   heparin  750 Units/hr (12/25/24 1828)   nitroGLYCERIN  Stopped (12/25/24 9161)    Assessment: 59 yo male admitted to [facility] on 1/16 for hypertensive crisis and flash pulmonary edema requiring BiPAP. Pharmacy was consulted to dose heparin  for chest pain/ACS. He was transferred to [facility] on 1/17 on 750 units/hr heparin  infusion. Patient has found to have NSTEMI so heparin  drip is being continued per cardiology.  Heparin  level 0.40 is therapeutic with heparin  running at 750 units/hr. Hgb (9.4) and PLTs (195) are slightly downtrending but stable. Patient renal function is stable. Per RN, no report of pauses, issues with the line, or signs of bleeding.    Goal of Therapy:  Heparin  level 0.3-0.7 units/ml Monitor platelets by anticoagulation protocol: Yes   Plan:  Continue heparin  at 750 units/hr Monitor daily heparin  levels and CBC Monitor for any signs/symptoms of bleeding   Thank you for allowing pharmacy to be involved with this patient's care.  Mendel Barter, PharmD PGY1 Clinical Pharmacist [facility]  12/26/2024 7:28 AM    [1]  Allergies Allergen Reactions   Ibuprofen  Other (See Comments)    Avoid per PCP   Oxcarbazepine Other (See Comments)    Patient goes out of right state of mind.

## 2024-12-27 ENCOUNTER — Inpatient Hospital Stay (HOSPITAL_COMMUNITY)

## 2024-12-27 ENCOUNTER — Encounter (HOSPITAL_COMMUNITY): Payer: Self-pay | Admitting: Internal Medicine

## 2024-12-27 DIAGNOSIS — I214 Non-ST elevation (NSTEMI) myocardial infarction: Secondary | ICD-10-CM

## 2024-12-27 DIAGNOSIS — N184 Chronic kidney disease, stage 4 (severe): Secondary | ICD-10-CM | POA: Diagnosis not present

## 2024-12-27 DIAGNOSIS — I509 Heart failure, unspecified: Secondary | ICD-10-CM

## 2024-12-27 LAB — LIPID PANEL
Cholesterol: 156 mg/dL (ref 0–200)
HDL: 44 mg/dL
LDL Cholesterol: 87 mg/dL (ref 0–99)
Total CHOL/HDL Ratio: 3.6 ratio
Triglycerides: 126 mg/dL
VLDL: 25 mg/dL (ref 0–40)

## 2024-12-27 LAB — CBC
HCT: 27.6 % — ABNORMAL LOW (ref 39.0–52.0)
Hemoglobin: 9.1 g/dL — ABNORMAL LOW (ref 13.0–17.0)
MCH: 32.4 pg (ref 26.0–34.0)
MCHC: 33 g/dL (ref 30.0–36.0)
MCV: 98.2 fL (ref 80.0–100.0)
Platelets: 205 K/uL (ref 150–400)
RBC: 2.81 MIL/uL — ABNORMAL LOW (ref 4.22–5.81)
RDW: 14.1 % (ref 11.5–15.5)
WBC: 6.1 K/uL (ref 4.0–10.5)
nRBC: 0 % (ref 0.0–0.2)

## 2024-12-27 LAB — GLUCOSE, CAPILLARY
Glucose-Capillary: 96 mg/dL (ref 70–99)
Glucose-Capillary: 147 mg/dL — ABNORMAL HIGH (ref 70–99)
Glucose-Capillary: 130 mg/dL — ABNORMAL HIGH (ref 70–99)
Glucose-Capillary: 151 mg/dL — ABNORMAL HIGH (ref 70–99)

## 2024-12-27 LAB — ECHOCARDIOGRAM COMPLETE
AR max vel: 2.62 cm2
AV Area VTI: 2.64 cm2
AV Area mean vel: 2.53 cm2
AV Mean grad: 3 mmHg
AV Peak grad: 5.6 mmHg
Ao pk vel: 1.18 m/s
Area-P 1/2: 3.72 cm2
Calc EF: 36.9 %
Height: 66 in
S' Lateral: 4.3 cm
Single Plane A2C EF: 33.1 %
Single Plane A4C EF: 39.7 %
Weight: 2490.32 [oz_av]

## 2024-12-27 LAB — BASIC METABOLIC PANEL WITH GFR
Anion gap: 12 (ref 5–15)
BUN: 34 mg/dL — ABNORMAL HIGH (ref 6–20)
CO2: 22 mmol/L (ref 22–32)
Calcium: 8.9 mg/dL (ref 8.9–10.3)
Chloride: 101 mmol/L (ref 98–111)
Creatinine, Ser: 4.18 mg/dL — ABNORMAL HIGH (ref 0.61–1.24)
GFR, Estimated: 16 mL/min — ABNORMAL LOW
Glucose, Bld: 101 mg/dL — ABNORMAL HIGH (ref 70–99)
Potassium: 4.1 mmol/L (ref 3.5–5.1)
Sodium: 136 mmol/L (ref 135–145)

## 2024-12-27 LAB — HEPARIN LEVEL (UNFRACTIONATED): Heparin Unfractionated: 0.64 [IU]/mL (ref 0.30–0.70)

## 2024-12-27 MED ORDER — PERFLUTREN LIPID MICROSPHERE
1.0000 mL | INTRAVENOUS | Status: AC | PRN
  Administered 2024-12-27: 3 mL via INTRAVENOUS

## 2024-12-27 MED ORDER — HEPARIN SODIUM (PORCINE) 5000 UNIT/ML IJ SOLN
5000.0000 [IU] | Freq: Three times a day (TID) | INTRAMUSCULAR | Status: DC
  Administered 2024-12-27 – 2024-12-28 (×4): 5000 [IU] via SUBCUTANEOUS
  Filled 2024-12-27 (×4): qty 1

## 2024-12-27 NOTE — Discharge Instructions (Signed)

## 2024-12-27 NOTE — Progress Notes (Addendum)
 "  Progress Note  Patient Name: Taylor Obrien Date of Encounter: 12/27/2024 [facility] Cardiologist: [name], MD   Interval Summary   No further complaints of chest pain.  No shortness of breath.  Feeling well overall.  Vital Signs Vitals:   12/26/24 2200 12/26/24 2300 12/27/24 0300 12/27/24 0727  BP:  126/71 121/67 (!) 143/80  Pulse: (!) 55 86 81 74  Resp: 17 14 15 13   Temp:  99 F (37.2 C) 99 F (37.2 C)   TempSrc:  Oral Oral   SpO2: (!) 75% 91% 100% 100%  Weight:   70.6 kg   Height:   5' 6 (1.676 m)     Intake/Output Summary (Last 24 hours) at 12/27/2024 1009 Last data filed at 12/27/2024 0948 Gross per 24 hour  Intake 600 ml  Output 1175 ml  Net -575 ml      12/27/2024    3:00 AM 12/26/2024    3:42 AM 12/25/2024    5:57 PM  Last 3 Weights  Weight (lbs) 155 lb 10.3 oz 156 lb 15.5 oz 156 lb 15.5 oz  Weight (kg) 70.6 kg 71.2 kg 71.2 kg      Telemetry/ECG  Sinus rhythm- Personally Reviewed  Physical Exam  GEN: No acute distress.   Neck: No JVD Cardiac: RRR, no murmurs, rubs, or gallops.  Respiratory: Clear to auscultation bilaterally. GI: Soft, nontender, non-distended  MS: No edema  Patient Profile Patient with past medical history significant for chronic HFrEF, CVA 2016, hypertension, PAD followed by vascular surgery not felt to be revascularization candidate, CKD stage IV, type 2 diabetes, OSA intolerant of CPAP, prior pancreatitis attributed to alcohol use, wheelchair dependence.  Patient here for complaints of chest pain with elevated troponins.  Found to be in hypertensive crisis on admission in the setting of medication noncompliance. Reported daughter does his meds and she was gone.   Assessment & Plan   Elevated troponins Troponins as high as 1500 on admission.  Suspected to be demand ischemia in the setting of hypertensive emergency.  Reported chest pain only when blood pressure was elevated.  Blood pressure much improved and he is  without any symptoms and plan has been for continued medical management in light of this as well as underlying CKD stage IV and concerns of noncompliance. IV heparin  stopped today.  Stent thrombosis would be more catastrophic than treating this medically. For now plan is medical.  Continue with aspirin /plavix , atorvastatin  80 mg and Vascepa  and fenofibrate . LDL this admission 87.  Follow-up outpatient. Echo pending.  Chronic HFrEF -11/2024 EF 30 to 35%, moderate concentric LVH.  Normal RV.  Mild MR. Etiology not entirely clear as he has not had formal ischemic evaluation but limited given underlying CKD and issues with noncompliance.  Fortunately seems to be well compensated on exam, euvolemic. GDMT: Continue carvedilol  12.5 mg twice daily, BiDil  20-37.5 mg 2 tablets 3 times daily.  Continue torsemide  40 mg daily.  CKD stage IV with anemia  Somewhat stable.  Hypertensive crisis Came in with pulmonary edema and significantly elevated blood pressure.  Now resolved and breathing well without any shortness of breath.  Continue management in the context of his GDMT and HFrEF.  PAD Followed by vascular surgery.  Not a revascularization candidate as he is no longer ambulating and wheelchair-bound.  Continue DAPT and restratification as above.  Needs to quit smoking.  Arranging follow up.   For questions or updates, please contact  [facility] Please consult [email or web address]  for contact info under       Signed, Thom LITTIE Sluder, PA-C    "

## 2024-12-27 NOTE — Progress Notes (Signed)
 2D Echocardiogram performed.

## 2024-12-27 NOTE — Plan of Care (Signed)

## 2024-12-27 NOTE — Progress Notes (Signed)
 CSW reviewed patients SDOH needs and provided/placed community resources to patients AVS.   Luise Pan, MSW, LCSWA Transitions of Care [phone]

## 2024-12-27 NOTE — Progress Notes (Signed)
 " PROGRESS NOTE    Taylor Obrien  FMW:[id number] DOB: 08-Aug-1966 DOA: 12/24/2024 PCP: Trudy Ave, NP   Brief Narrative:    Taylor Obrien  is a 59 y.o. male, with medical history of HFrEF (EF 30-35%), CKD4 (Cr ~4.0), prior CVA, PAD, and T2DM with recent hospitalization x 3 within the last month, just discharged 1//2026, multiple admissions for hypertensive emergency, NSTEMI due to demand ischemia, requiring nitro drip, aspirin , Plavix  and nitro drip for blood pressure control with multiple adjustment of his medications.  Presented again with dyspnea chest pain and low oxygen  saturations.  Patient was hospitalized for further management.  Initially required BiPAP along with nitroglycerin  and heparin  infusion.  Was slowly weaned off of BiPAP.  Transferred to [facility] for further workup.  Assessment & Plan:   NSTEMI Elevated troponins Patient presented with pulmonary edema, concern for NSTEMI, possibly demand ischemia secondary to hypertensive emergency, and respiratory failure. Patient was started on heparin  infusion.  Patient seen by cardiology.  Due to his history of noncompliance and chronic kidney disease plan is for medical management going forward. Patient is noted to be on carvedilol , BiDil .  Plavix  was added to aspirin .  He is on statin Vascepa  and fenofibrate .  His LDL is 87.  Triglyceride 126. Did experience drop in blood pressure yesterday.  See below. Heparin  has been discontinued.   Hypertensive emergency-resolved Mentioned that he is compliant with his medications though he has had several admissions with similar presentation.  There does appear to be a component of noncompliance. Patient was started back on his medications yesterday.  Experienced a decrease in his blood pressure yesterday afternoon.  He got symptomatic.  Some of his evening medicines were held.  Dose of carvedilol  has been decreased.  He is also noted to be on BiDil  and torsemide .  Await cardiology  input this morning. Nitroglycerin  infusion has been weaned off.  Acute respiratory failure with hypoxemia This is secondary to pulmonary edema from acute CHF and elevated blood pressure. Initially required BiPAP.  Has been weaned off of it.  Currently saturating normal on room air.  Acute on chronic HFrEF (EF 30-35%):  Ischemic cardiomyopathy Started on IV diuretics.  Changed over to torsemide  now.  On carvedilol  and BiDil .    CKd stage IV  Baseline creatinine around 4.5.  Creatinine is close to baseline.  Continue to monitor urine output.  Avoid nephrotoxic agents.  Patient mentions that he is not followed by nephrology.  He lives in the [place] area.  Can follow-up with a local nephrologist.   Diabetes mellitus, type II Diet controlled.  Monitor CBGs.  Normocytic anemia Drop in hemoglobin is likely due to fluid shift.  No evidence of overt bleeding.  Hemoglobin is stable now.   Tobacco abuse Counseled, continue with nicotine  patch   Debility, history of CVA: Wheelchair-bound at baseline.  PT OT.      DVT prophylaxis: Heparin  infusion discontinued.  Subcutaneous heparin  can.  Started. Code Status: Full code Family Communication: None at bedside Disposition Plan: Home when improved   Consultants:  Cardiology PCCM  Procedures:  None  Antimicrobials:  None   Subjective: Patient feels well.  Sitting on the side of the bed.  Denies any dizziness lightheadedness.  No chest pain or shortness of breath.  No nausea or vomiting.  Objective: Vitals:   12/26/24 2200 12/26/24 2300 12/27/24 0300 12/27/24 0727  BP:  126/71 121/67 (!) 143/80  Pulse: (!) 55 86 81 74  Resp: 17 14 15  13  Temp:  99 F (37.2 C) 99 F (37.2 C)   TempSrc:  Oral Oral   SpO2: (!) 75% 91% 100% 100%  Weight:   70.6 kg   Height:   5' 6 (1.676 m)     Intake/Output Summary (Last 24 hours) at 12/27/2024 0852 Last data filed at 12/27/2024 0552 Gross per 24 hour  Intake --  Output 850 ml  Net  -850 ml   Filed Weights   12/25/24 1757 12/26/24 0342 12/27/24 0300  Weight: 71.2 kg 71.2 kg 70.6 kg    Examination:  General appearance: Awake alert.  In no distress Resp: Clear to auscultation bilaterally.  Normal effort Cardio: S1-S2 is normal regular.  No S3-S4.  No rubs murmurs or bruit GI: Abdomen is soft.  Nontender nondistended.  Bowel sounds are present normal.  No masses organomegaly     Data Reviewed: I have personally reviewed following labs and imaging studies  CBC: Recent Labs  Lab 12/25/24 0511 12/26/24 0243 12/27/24 0227  WBC 14.2* 7.4 6.1  HGB 11.3* 9.4* 9.1*  HCT 34.3* 27.9* 27.6*  MCV 98.3 96.2 98.2  PLT 238 195 205   Basic Metabolic Panel: Recent Labs  Lab 12/24/24 1712 12/25/24 0511 12/26/24 0243 12/27/24 0227  NA 143 141 140 136  K 4.0 4.8 3.7 4.1  CL 105 106 105 101  CO2 15* 22 24 22   GLUCOSE 244* 107* 126* 101*  BUN 29* 32* 35* 34*  CREATININE 3.77* 3.59* 3.93* 4.18*  CALCIUM  9.4 8.9 8.5* 8.9   GFR: Estimated Creatinine Clearance: 17.8 mL/min (A) (by C-G formula based on SCr of 4.18 mg/dL (H)). Liver Function Tests: Recent Labs  Lab 12/24/24 1712  AST 67*  ALT 21  ALKPHOS 76  BILITOT 0.3  PROT 7.8  ALBUMIN 3.9   Coagulation Profile: Recent Labs  Lab 12/24/24 1922  INR 1.0    BNP (last 3 results) Recent Labs    11/25/24 1925 12/10/24 0345 12/24/24 1712  PROBNP 5,832.0* 4,920.0* 9,603.0*   CBG: Recent Labs  Lab 12/26/24 0609 12/26/24 1152 12/26/24 1607 12/26/24 2123 12/27/24 0555  GLUCAP 100* 135* 85 137* 96    Thyroid Function Tests: Recent Labs    12/24/24 1922  TSH 0.521   Sepsis Labs: Recent Labs  Lab 12/24/24 1712 12/24/24 1924  LATICACIDVEN 7.6* 1.5    Recent Results (from the past 240 hours)  Resp panel by RT-PCR (RSV, Flu A&B, Covid) Anterior Nasal Swab     Status: None   Collection Time: 12/24/24  8:55 PM   Specimen: Anterior Nasal Swab  Result Value Ref Range Status   SARS  Coronavirus 2 by RT PCR NEGATIVE NEGATIVE Final    Comment: (NOTE) SARS-CoV-2 target nucleic acids are NOT DETECTED.  The SARS-CoV-2 RNA is generally detectable in upper respiratory specimens during the acute phase of infection. The lowest concentration of SARS-CoV-2 viral copies this assay can detect is 138 copies/mL. A negative result does not preclude SARS-Cov-2 infection and should not be used as the sole basis for treatment or other patient management decisions. A negative result may occur with  improper specimen collection/handling, submission of specimen other than nasopharyngeal swab, presence of viral mutation(s) within the areas targeted by this assay, and inadequate number of viral copies(<138 copies/mL). A negative result must be combined with clinical observations, patient history, and epidemiological information. The expected result is Negative.  Fact Sheet for Patients:  bloggercourse.com  Fact Sheet for Healthcare Providers:  seriousbroker.it  This  test is no t yet approved or cleared by the [place]  FDA and  has been authorized for detection and/or diagnosis of SARS-CoV-2 by FDA under an Emergency Use Authorization (EUA). This EUA will remain  in effect (meaning this test can be used) for the duration of the COVID-19 declaration under Section 564(b)(1) of the Act, 21 U.S.C.section 360bbb-3(b)(1), unless the authorization is terminated  or revoked sooner.       Influenza A by PCR NEGATIVE NEGATIVE Final   Influenza B by PCR NEGATIVE NEGATIVE Final    Comment: (NOTE) The Xpert Xpress SARS-CoV-2/FLU/RSV plus assay is intended as an aid in the diagnosis of influenza from Nasopharyngeal swab specimens and should not be used as a sole basis for treatment. Nasal washings and aspirates are unacceptable for Xpert Xpress SARS-CoV-2/FLU/RSV testing.  Fact Sheet for  Patients: bloggercourse.com  Fact Sheet for Healthcare Providers: seriousbroker.it  This test is not yet approved or cleared by the [place]  FDA and has been authorized for detection and/or diagnosis of SARS-CoV-2 by FDA under an Emergency Use Authorization (EUA). This EUA will remain in effect (meaning this test can be used) for the duration of the COVID-19 declaration under Section 564(b)(1) of the Act, 21 U.S.C. section 360bbb-3(b)(1), unless the authorization is terminated or revoked.     Resp Syncytial Virus by PCR NEGATIVE NEGATIVE Final    Comment: (NOTE) Fact Sheet for Patients: bloggercourse.com  Fact Sheet for Healthcare Providers: seriousbroker.it  This test is not yet approved or cleared by the [place]  FDA and has been authorized for detection and/or diagnosis of SARS-CoV-2 by FDA under an Emergency Use Authorization (EUA). This EUA will remain in effect (meaning this test can be used) for the duration of the COVID-19 declaration under Section 564(b)(1) of the Act, 21 U.S.C. section 360bbb-3(b)(1), unless the authorization is terminated or revoked.  Performed at [facility], [address], [place], [place] [address]      Radiology Studies: No results found.   Scheduled Meds:  aspirin  EC  81 mg Oral Q breakfast   atorvastatin   80 mg Oral Daily   calcitRIOL   0.25 mcg Oral Q M,W,F   carvedilol   12.5 mg Oral BID WC   clopidogrel   75 mg Oral Daily   escitalopram   20 mg Oral Daily   fenofibrate   54 mg Oral Daily   icosapent  Ethyl  2 g Oral BID   insulin  aspart  0-5 Units Subcutaneous QHS   insulin  aspart  0-9 Units Subcutaneous TID WC   isosorbide -hydrALAZINE   2 tablet Oral TID   nicotine   21 mg Transdermal Daily   pantoprazole   40 mg Oral Daily   torsemide   40 mg Oral Daily   Continuous Infusions:     LOS: 3 days    Joette Pebbles,  [facility]  If 7PM-7AM, please contact night-coverage [email or web address] 12/27/2024, 8:52 AM   "

## 2024-12-27 NOTE — TOC Initial Note (Signed)
 Transition of Care [facility]) - Initial/Assessment Note    Patient Details  Name: Taylor Obrien MRN: [id number] Date of Birth: 05-25-1966  Transition of Care [facility]) CM/SW Contact:    Waddell Barnie Rama, RN Phone Number: 12/27/2024, 3:40 PM  Clinical Narrative:                 From home with daughter and son, has PCP , Oneil Severin NP [phone] and insurance on file, states has no HH services in place at this time , has an mining engineer w/chair and a old manuel w/chair which is broke at home.  States family member  (daughter) will transport them home at [employer] and family is support system, states gets medications from [place].  Pta self ambulatory.   Per PT eval rec HHPT, HHOT and a w/chair. Will need HHRN for med management.  NCM offered choice, he has no preference .  NCM sent thru portal.  [facility] accepted  referral.  Soc will begin 24 to 48 hrs post dc. NCM made referral to Ryan with [employer] for light weight w/chair.   Expected Discharge Plan: Home w Home Health Services Barriers to Discharge: Continued Medical Work up   Patient Goals and CMS Choice Patient states their goals for this hospitalization and ongoing recovery are:: return home with daughter and son CMS Medicare.gov Compare Post Acute Care list provided to:: Patient Choice offered to / list presented to : Patient      Expected Discharge Plan and Services In-house Referral: NA Discharge Planning Services: CM Consult Post Acute Care Choice: Home Health Living arrangements for the past 2 months: Single Family Home                 DME Arranged: Wheelchair manual DME Agency: [facility] Date DME Agency Contacted: 12/27/24 Time DME Agency Contacted: 1531 Representative spoke with at DME Agency: Ryan HH Arranged: RN, PT, OT [facility] Agency: [facility] Date [facility] Agency Contacted: 12/27/24 Time HH Agency Contacted: 1536 Representative spoke with at [facility] Agency: Adriana  Prior Living  Arrangements/Services Living arrangements for the past 2 months: Single Family Home Lives with:: Adult Children Patient language and need for interpreter reviewed:: Yes Do you feel safe going back to the place where you live?: Yes      Need for Family Participation in Patient Care: Yes (Comment) Care giver support system in place?: Yes (comment) Current home services: DME (electric w.chair and manuel w/chair that is broke) Criminal Activity/Legal Involvement Pertinent to Current Situation/Hospitalization: No - Comment as needed  Activities of Daily Living   ADL Screening (condition at time of admission) Independently performs ADLs?: No Does the patient have a NEW difficulty with bathing/dressing/toileting/self-feeding that is expected to last >3 days?: Yes (Initiates electronic notice to provider for possible OT consult) Does the patient have a NEW difficulty with getting in/out of bed, walking, or climbing stairs that is expected to last >3 days?: Yes (Initiates electronic notice to provider for possible PT consult) Does the patient have a NEW difficulty with communication that is expected to last >3 days?: No Is the patient deaf or have difficulty hearing?: No Does the patient have difficulty seeing, even when wearing glasses/contacts?: No Does the patient have difficulty concentrating, remembering, or making decisions?: No  Permission Sought/Granted Permission sought to share information with : Case Manager Permission granted to share information with : Yes, Verbal Permission Granted     Permission granted to share info w AGENCY: [facility]  Emotional Assessment Appearance:: Appears stated age Attitude/Demeanor/Rapport: Engaged Affect (typically observed): Appropriate Orientation: : Oriented to Self, Oriented to Place, Oriented to  Time, Oriented to Situation Alcohol / Substance Use: Not Applicable Psych Involvement: No (comment)  Admission diagnosis:  NSTEMI (non-ST elevated  myocardial infarction) (HCC) [I21.4] Acute decompensated heart failure (HCC) [I50.9] Patient Active Problem List   Diagnosis Date Noted   Demand ischemia (HCC) 12/26/2024   Acute on chronic systolic (congestive) heart failure (HCC) 12/10/2024   Hypocalcemia 12/02/2024   CKD (chronic kidney disease), stage IV (HCC) 11/27/2024   NSTEMI (non-ST elevated myocardial infarction) (HCC) 11/27/2024   Acute on chronic heart failure with reduced ejection fraction (HFrEF, <= 40%) (HCC) 11/27/2024   Elevated troponin 11/27/2024   Acute hypoxemic respiratory failure (HCC) 11/25/2024   Acute systolic heart failure (HCC) 11/01/2024   Hypertensive emergency 10/31/2024   Suicidal ideation 04/21/2024   MDD (major depressive disorder), recurrent severe, 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   Intentional diphenhydramine  overdose (HCC) 04/19/2024   Pancreatic duct dilated 01/31/2024   Hypertensive urgency 01/30/2024   Chronic kidney disease, stage 3b (HCC) 01/30/2024   Type 2 diabetes mellitus with hyperglycemia (HCC) 01/30/2024   History of right ACA stroke 01/30/2024   Anxiety 01/30/2024   Fall at home, initial encounter 06/18/2023   AKI (acute kidney injury) 06/18/2023   Acute pancreatitis 07/19/2022   GERD (gastroesophageal reflux disease)    Iron deficiency anemia    Constipation    Abdominal pain 06/14/2022   Nausea and vomiting 06/14/2022   Elevated lipase 06/14/2022   Hypoalbuminemia due to protein-calorie malnutrition 06/14/2022   Stage 3a chronic kidney disease (CKD) (HCC) 06/04/2022   Anemia    Acute pancreatitis without infection or necrosis    Transaminasemia 04/29/2022   Class 1 obesity 04/29/2022   Thrombocytosis 04/26/2022   Acute alcoholic pancreatitis 04/26/2022   Diabetes mellitus (HCC) 04/24/2022   Post-operative state 01/24/2018   Spastic hemiplegia affecting nondominant side (HCC) 06/03/2016   Dysuria    OSA (obstructive sleep apnea)    Hypokalemia    Elevated blood  pressure    Hemiparesis affecting left side as late effect of stroke (HCC)    Epistaxis    HTN (hypertension) 11/27/2015   Acute renal failure superimposed on stage 3a chronic kidney disease (HCC) 11/27/2015   Hemiplegia and hemiparesis following unspecified cerebrovascular disease affecting left non-dominant side (HCC) 11/23/2015   Gait disturbance, post-stroke 11/23/2015   Cerebrovascular accident (CVA) due to occlusion of right anterior cerebral artery (HCC)    Essential hypertension    Depression    Chronic pain syndrome    ETOH abuse    Marijuana abuse    Cerebrovascular accident (CVA) due to thrombosis of right carotid artery (HCC)    Malignant hypertension    Mixed hyperlipidemia    Cerebral infarction (HCC) 11/18/2015   Stroke (cerebrum) (HCC) 11/18/2015   Chest pain 10/09/2012   Chronic back pain 10/09/2012   Tobacco abuse 10/09/2012   Hypertensive heart disease 08/31/2012   Accelerated hypertension 08/31/2012   Precordial pain 08/31/2012   PCP:  Trudy Ave, NP Pharmacy:   [facility] - [place], [place] - [address] [place] [address] Phone: [phone] Fax: [phone]     Social Drivers of Health (SDOH) Social History: SDOH Screenings   Food Insecurity: Food Insecurity Present (12/25/2024)  Housing: High Risk (12/25/2024)  Transportation Needs: Unmet Transportation Needs (12/25/2024)  Utilities: Not At Risk (12/25/2024)  Recent Concern: Utilities - At  Risk (12/10/2024)  Alcohol Screen: High Risk (04/21/2024)  Social Connections: Unknown (12/10/2024)  Tobacco Use: High Risk (12/27/2024)   SDOH Interventions: Food Insecurity Interventions: Inpatient TOC, Community Resources Provided Housing Interventions: Inpatient TOC, Community Resources Provided Transportation Interventions: Inpatient TOC, Community Resources Provided   Readmission Risk Interventions    12/27/2024    3:24 PM 12/10/2024   12:43 PM 11/26/2024   11:27  AM  Readmission Risk Prevention Plan  Transportation Screening Complete Complete Complete  Medication Review Oceanographer) Complete Complete Complete  PCP or Specialist appointment within 3-5 days of discharge Complete  Complete  HRI or Home Care Consult Complete Complete Complete  SW Recovery Care/Counseling Consult  Complete Complete  Palliative Care Screening Not Applicable Not Applicable Not Applicable  Skilled Nursing Facility Not Applicable Not Applicable Not Applicable

## 2024-12-27 NOTE — Progress Notes (Signed)
 PHARMACY - ANTICOAGULATION CONSULT NOTE  Pharmacy Consult for heparin  Indication: chest pain/ACS  Allergies[1]  Patient Measurements: Height: 5' 6 (167.6 cm) Weight: 70.6 kg (155 lb 10.3 oz) IBW/kg (Calculated) : 63.8 HEPARIN  DW (KG): 70.6  Vital Signs: Temp: 99 F (37.2 C) (01/19 0300) Temp Source: Oral (01/19 0300) BP: 143/80 (01/19 0727) Pulse Rate: 74 (01/19 0727)  Labs: Recent Labs    12/24/24 1712 12/24/24 1922 12/25/24 0511 12/25/24 1128 12/26/24 0243 12/27/24 0227  HGB   < >  --  11.3*  --  9.4* 9.1*  HCT  --   --  34.3*  --  27.9* 27.6*  PLT  --   --  238  --  195 205  APTT  --  30  --   --   --   --   LABPROT  --  13.4  --   --   --   --   INR  --  1.0  --   --   --   --   HEPARINUNFRC   < >  --  0.51 0.71* 0.40 0.64  CREATININE  --   --  3.59*  --  3.93* 4.18*   < > = values in this interval not displayed.    Estimated Creatinine Clearance: 17.8 mL/min (A) (by C-G formula based on SCr of 4.18 mg/dL (H)).  Assessment: 59 yo male admitted on 1/16 for hypertensive crisis and flash pulmonary edema requiring BiPAP. Pharmacy was consulted to dose heparin  for chest pain/ACS. He was transferred to [facility] on 1/17 on 750 units/hr heparin  infusion. Patient found to have NSTEMI so heparin  drip continued per cardiology.  Heparin  level therapeutic x2 on heparin  rate 750 units/hr. CBC stable. Patient also on DAPT with aspirin  and clopidogrel . No report of pauses, issues with the line, or signs of bleeding.   Confirmed with cardiology - no plans for further interventions or need for further anticoagulation. Heparin  infusion started 1/16 PM, now completed 48 hours per ACS protocol. OK to discontinue.  Goal of Therapy:  Heparin  level 0.3-0.7 units/ml Monitor platelets by anticoagulation protocol: Yes   Plan:  Stop heparin  infusion (completed > 48 hours) Check heparin  level daily while on heparin  Continue to monitor H&H and platelets  Thank you for allowing pharmacy to  be a part of this patients care.  Shelba Collier, PharmD, BCPS Clinical Pharmacist    [1]  Allergies Allergen Reactions   Ibuprofen  Other (See Comments)    Avoid per PCP   Oxcarbazepine Other (See Comments)    Patient goes out of right state of mind.

## 2024-12-27 NOTE — Evaluation (Signed)
 Occupational Therapy Evaluation Patient Details Name: Taylor Obrien MRN: [id number] DOB: Apr 23, 1966 Today's Date: 12/27/2024   History of Present Illness   The pt is a 59 yo male presenting 1/16 with SOB. Work up revealed HTN emergency with flash pulmonary edema, NSTEMI, and acute CHF exacerbation. PMH includes: alcoholic pancreatitis, arthritis, HLD, HFrEF, hypoxic resp failure, CKD IV, CVA, DM II, NSTEMI.     Clinical Impressions Pt reported at [facility] they live son who can assist some as he works 5pm-close. Pt at baseline completes squat pivot transfer to WC, completed sponge baths and completed light meal prep. At this time attempted to initially to go to L side but then with attempts then reported they could not complete and every transfer in the home they can complete to the R side? He then completed squat pivot to R side with mod assist. He reported normally he has a bar to pull self to sit to stand for peri care when family is not home as concern for ability to complete transfers at home. Pt at this time declining any SNF rehab but agreeable to [facility] and is going to be getting an aide? At this time recommendation for Acute Therapy to follow to maximize independence.     If plan is discharge home, recommend the following:   A lot of help with walking and/or transfers;A lot of help with bathing/dressing/bathroom;Assistance with cooking/housework;Direct supervision/assist for medications management;Direct supervision/assist for financial management;Assist for transportation;Help with stairs or ramp for entrance     Functional Status Assessment   Patient has had a recent decline in their functional status and demonstrates the ability to make significant improvements in function in a reasonable and predictable amount of time.     Equipment Recommendations   Wheelchair (measurements OT);Wheelchair cushion (measurements OT) (needs a new WC)     Recommendations for Other Services          Precautions/Restrictions   Precautions Precautions: Fall Recall of Precautions/Restrictions: Intact Restrictions Weight Bearing Restrictions Per Provider Order: No     Mobility Bed Mobility               General bed mobility comments: pt presented at the EOB    Transfers Overall transfer level: Needs assistance Equipment used: None       Squat pivot transfers: Mod assist       General transfer comment: attempted intially to go to the L side to see if they could transfer but then reported they could not as they normally transfer to R side and all transfers in the home they can do to the R side? Pt then completed squat pivot to chair      Balance Overall balance assessment: Needs assistance Sitting-balance support: Feet supported Sitting balance-Leahy Scale: Good     Standing balance support: Bilateral upper extremity supported Standing balance-Leahy Scale: Poor Standing balance comment: unable to achieve full stand                           ADL either performed or assessed with clinical judgement   ADL Overall ADL's : Needs assistance/impaired Eating/Feeding: Set up;Sitting   Grooming: Set up;Sitting   Upper Body Bathing: Set up;Sitting   Lower Body Bathing: Minimal assistance;Sitting/lateral leans   Upper Body Dressing : Set up;Sitting   Lower Body Dressing: Minimal assistance;Sitting/lateral leans   Toilet Transfer: Moderate assistance;Squat-pivot Toilet Transfer Details (indicate cue type and reason): to R side  Vision         Perception         Praxis         Pertinent Vitals/Pain Pain Assessment Pain Assessment: 0-10 Pain Score: 1  Breathing: normal Negative Vocalization: none Facial Expression: smiling or inexpressive Body Language: relaxed Consolability: no need to console PAINAD Score: 0 Pain Location: back Pain Descriptors / Indicators: Discomfort Pain Intervention(s): Limited activity  within patient's tolerance, Monitored during session, Repositioned     Extremity/Trunk Assessment Upper Extremity Assessment Upper Extremity Assessment: Overall WFL for tasks assessed   Lower Extremity Assessment Lower Extremity Assessment: Defer to PT evaluation [name] Deficits / Details: chronic [name] weakness after stroke in 2016   Cervical / Trunk Assessment Cervical / Trunk Assessment: Kyphotic   Communication Communication Communication: Impaired Factors Affecting Communication: Reduced clarity of speech   Cognition Arousal: Alert Behavior During Therapy: WFL for tasks assessed/performed Cognition:  (noted to need increase in time to problem solve, looking at therapist when MD entered into the room for answers?)             OT - Cognition Comments: further assess, medication magement ?                 Following commands: Intact       Cueing  General Comments   Cueing Techniques: Verbal cues      Exercises     Shoulder Instructions      Home Living Family/patient expects to be discharged to:: Private residence Living Arrangements: Children (son) Available Help at Discharge: Available PRN/intermittently (son works 5- close as optician, dispensing) Type of Home: Apartment Home Access: Ramped entrance     Home Layout: One level     Bathroom Shower/Tub: Sponge bathes at baseline   [employer]: Standard Bathroom Accessibility: Yes How Accessible: Accessible via wheelchair Home Equipment: Wheelchair - manual   Additional Comments: likes to rap, concern about children having the time and ability to care for him like he needs      Prior Functioning/Environment Prior Level of Function : Needs assist       Physical Assist : Mobility (physical)     Mobility Comments: Pt reports he's non-ambulatory, transfers to/from w/c without assistance, goes to the nearest store in his w/c. single fall in last 6 months trying to get WC into car after driving to a  store ADLs Comments: sink bathes, independent with ADLs, cooks food in microwave, needs assist for medications but son cannot assist    OT Problem List: Decreased strength;Decreased activity tolerance;Impaired balance (sitting and/or standing);Decreased safety awareness;Decreased knowledge of use of DME or AE;Pain   OT Treatment/Interventions: Self-care/ADL training;Therapeutic exercise;DME and/or AE instruction;Therapeutic activities;Patient/family education;Balance training      OT Goals(Current goals can be found in the care plan section)   Acute Rehab OT Goals Patient Stated Goal: to get a new WC OT Goal Formulation: With patient Time For Goal Achievement: 01/10/25 Potential to Achieve Goals: Good   OT Frequency:  Min 2X/week    Co-evaluation              AM-PAC OT 6 Clicks Daily Activity     Outcome Measure Help from another person eating meals?: None Help from another person taking care of personal grooming?: None Help from another person toileting, which includes using toliet, bedpan, or urinal?: A Lot Help from another person bathing (including washing, rinsing, drying)?: A Lot Help from another person to put on and taking off regular upper body clothing?:  A Little Help from another person to put on and taking off regular lower body clothing?: A Lot 6 Click Score: 17   End of Session Equipment Utilized During Treatment: Gait belt Nurse Communication: Mobility status  Activity Tolerance: Patient tolerated treatment well Patient left: in chair;with call bell/phone within reach;with chair alarm set  OT Visit Diagnosis: Other abnormalities of gait and mobility (R26.89);Unsteadiness on feet (R26.81);Muscle weakness (generalized) (M62.81);History of falling (Z91.81);Pain Pain - Right/Left:  (back)                Time: 9173-9142 OT Time Calculation (min): 31 min Charges:  OT General Charges $OT Visit: 1 Visit OT Evaluation $OT Eval Low Complexity: 1 Low OT  Treatments $Self Care/Home Management : 8-22 mins  Warrick POUR OTR/L  Acute Rehab Services  [phone] office number   Warrick Berber 12/27/2024, 9:07 AM

## 2024-12-28 ENCOUNTER — Other Ambulatory Visit (HOSPITAL_COMMUNITY): Payer: Self-pay

## 2024-12-28 ENCOUNTER — Telehealth: Payer: Self-pay

## 2024-12-28 DIAGNOSIS — I2489 Other forms of acute ischemic heart disease: Secondary | ICD-10-CM | POA: Diagnosis not present

## 2024-12-28 LAB — BASIC METABOLIC PANEL WITH GFR
Anion gap: 12 (ref 5–15)
BUN: 36 mg/dL — ABNORMAL HIGH (ref 6–20)
CO2: 23 mmol/L (ref 22–32)
Calcium: 9 mg/dL (ref 8.9–10.3)
Chloride: 103 mmol/L (ref 98–111)
Creatinine, Ser: 4.34 mg/dL — ABNORMAL HIGH (ref 0.61–1.24)
GFR, Estimated: 15 mL/min — ABNORMAL LOW
Glucose, Bld: 98 mg/dL (ref 70–99)
Potassium: 3.7 mmol/L (ref 3.5–5.1)
Sodium: 138 mmol/L (ref 135–145)

## 2024-12-28 LAB — GLUCOSE, CAPILLARY: Glucose-Capillary: 100 mg/dL — ABNORMAL HIGH (ref 70–99)

## 2024-12-28 MED ORDER — CLOPIDOGREL BISULFATE 75 MG PO TABS
75.0000 mg | ORAL_TABLET | Freq: Every day | ORAL | 0 refills | Status: DC
  Filled 2024-12-28: qty 90, 90d supply, fill #0

## 2024-12-28 MED ORDER — POTASSIUM CHLORIDE CRYS ER 20 MEQ PO TBCR: 40.0000 meq | EXTENDED_RELEASE_TABLET | Freq: Once | ORAL | Status: DC

## 2024-12-28 MED ORDER — OXYCODONE HCL 5 MG PO TABS
5.0000 mg | ORAL_TABLET | Freq: Four times a day (QID) | ORAL | 0 refills | Status: AC | PRN
  Filled 2024-12-28: qty 15, 4d supply, fill #0

## 2024-12-28 MED ORDER — TORSEMIDE 20 MG PO TABS
40.0000 mg | ORAL_TABLET | Freq: Every day | ORAL | 0 refills | Status: DC
  Filled 2024-12-28: qty 120, 60d supply, fill #0

## 2024-12-28 NOTE — Plan of Care (Signed)
" °  Problem: Education: Goal: Knowledge of General Education information will improve Description: Including pain rating scale, medication(s)/side effects and non-pharmacologic comfort measures Outcome: Progressing   Problem: Clinical Measurements: Goal: Ability to maintain clinical measurements within normal limits will improve Outcome: Progressing Goal: Will remain free from infection Outcome: Progressing   Problem: Elimination: Goal: Will not experience complications related to urinary retention Outcome: Progressing   Problem: Activity: Goal: Risk for activity intolerance will decrease Outcome: Not Progressing   "

## 2024-12-28 NOTE — Progress Notes (Signed)
" ° °  Heart Failure Stewardship Pharmacist Progress Note   PCP: Trudy Ave, NP PCP-Cardiologist: [name], MD    HPI:  59 year old male with PMH significant for HFrEF, CKD4, prior CVA, PAD, T2DM, current smoker (1/2 packs/day x 30 years), and 3 recent hospitalizations within the last month (last discharged 1/5) for hypertensive emergency and NSTEMI, who presented to the ED on 12/24/24 with a chief complaint of dyspnea and chest pain. CXR showed interstitial prominence throughout the lungs improved since previous study, reflecting residual or recurrent pulmonary edema or CHF. ECHO revealed an EF of 45-50%, which is improved from 30-35% previously.  Patient is doing well today and is ready for discharge. He denies SOB or chest pain. He does not have any lower extremity edema observed on physical exam. His potassium was 3.7 today. I reached out to the provider and ordered potassium 40 mEq x1. Patient stated that he had reached out to try to find a nurse to come to his house and help him with his medications and daily tasks, such as making his bed. We discussed medication changes, such as stopping the losartan  and taking 2 of the torsemide  tablets once daily. We also discussed the importance of adherence and that there may be potential medication changes as he continues to follow with cardiology for his heart failure. Patient expressed interest in having his medications mailed to him through the [employer] in the future.  Current HF Medications: Diuretic: torsemide  40 mg daily Beta Blocker: carvedilol  12.5 mg BID Other: Bidil  40-75 mg TID  Prior to admission HF Medications: Diuretic: torsemide  20 mg BID Beta blocker: carvedilol  25 mg BID ACE/ARB/ARNI: losartan  50 mg daily Other: Bidil  40-75 mg TID   Pertinent Lab Values: Serum creatinine 4.34 (up from 3.77), BUN 36, Potassium 3.7, Sodium 138, proBNP 9,603, Magnesium  - not obtained, A1c 5.8%  Vital Signs: Weight: 150 lbs  (admission weight: 154 lbs) Blood pressure: 120s-175/70s-90s  Heart rate: 80s-90s  I/O: net +0.1 L yesterday; net -0.4L since admission  Medication Assistance / Insurance Benefits Check: Does the patient have prescription insurance?  Yes Type of insurance plan: Medicare + Medicaid  Outpatient Pharmacy:  Prior to admission outpatient pharmacy: [facility] Is the patient willing to use [facility] TOC pharmacy at discharge? Yes Is the patient willing to transition their outpatient pharmacy to utilize a [facility] outpatient pharmacy?   Yes    Assessment: 1. Acute on chronic systolic CHF (LVEF 45-50%), due to ischemic etiology. NYHA class II symptoms. - No lower extremity edema present on physical exam - CKD4 (CrCl <20) with increased Scr > SGLT2i contraindicated - K 3.7  - HR and BP elevated Plan: 1) Medication changes recommended at this time: - Give potassium 40 mEq x1 - Increase carvedilol  to 25 mg BID  2)  Education  - Patient has been educated on current HF medications and potential additions to HF medication regimen - Patient verbalizes understanding that over the next few months, these medication doses may change and more medications may be added to optimize HF regimen - Patient has been educated on basic disease state pathophysiology and goals of therapy   B. Maegan Konica Stankowski, PharmD PGY-1 Pharmacy Resident [facility] 12/28/2024 9:06 AM    "

## 2024-12-28 NOTE — Progress Notes (Signed)
 Heart Failure Nurse Navigator Progress Note  PCP: Trudy Ave, NP PCP-Cardiologist: No recent out patient care.  Last consult Dr. Mallipeddi. Admission Diagnosis: Mixed hyperlipidemia  Tobacco abuse Stage 3a chronic kidney disease (CKD) (HCC) Type 2 diabetes mellitus with hyperglycemia (HCC) Acute systolic heart  failure (HCC) CKD (chronic kidney disease), stage IV (HCC) Demand ischemia [facility])  Admitted from: Home via POV  Presentation:   Taylor Obrien is a 59 y.o.male.  He presented with shortness of breath. Has had multiple recent admissions due to hypertensive emergency with flash pulmonary edema.  NSTEMI earlier this month.  Has a cough and complained of chest pain.  History of HFrEF, hypoxic respiratory failure, CKD, CVA, Type 2 diabetes.  Pro-BNP-9,603.  HS-Troponin 426. Chest x-ray concerning for recurrent pulmonary edema/CHF. Patient is a smoker.   ECHO/ LVEF: 45-50%  Clinical Course:  Past Medical History:  Diagnosis Date   Acute alcoholic pancreatitis    Arthritis    CHF (congestive heart failure) (HCC)    Chronic pain    Depression    Diabetes mellitus without complication (HCC)    Essential hypertension, benign    GERD (gastroesophageal reflux disease)    Headache    HTN (hypertension) 11/27/2015   Hyperlipidemia    Peripheral vascular disease    Sleep apnea    could not tolerate CPAP   Stroke [facility])    2016     Social History   Socioeconomic History   Marital status: Married    Spouse name: Not on file   Number of children: Not on file   Years of education: Not on file   Highest education level: Not on file  Occupational History   Not on file  Tobacco Use   Smoking status: Every Day    Current packs/day: 0.50    Average packs/day: 0.5 packs/day for 30.0 years (15.0 ttl pk-yrs)    Types: Cigarettes   Smokeless tobacco: Never  Vaping Use   Vaping status: Never Used  Substance and Sexual Activity   Alcohol use: Not Currently    Comment: 2 40  oz daily. 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 Stopped liquor 5 years ago.states no etoh since 03/2022   Drug use: Yes    Types: Marijuana    Comment: occassionally   Sexual activity: Yes    Birth control/protection: None  Other Topics Concern   Not on file  Social History Narrative   Not on file   Social Drivers of Health   Tobacco Use: High Risk (12/27/2024)   Patient History    Smoking Tobacco Use: Every Day    Smokeless Tobacco Use: Never    Passive Exposure: Not on file  Financial Resource Strain: Not on file  Food Insecurity: Food Insecurity Present (12/25/2024)   Epic    Worried About Programme Researcher, Broadcasting/film/video in the Last Year: Often true    Barista in the Last Year: Often true  Transportation Needs: Unmet Transportation Needs (12/25/2024)   Epic    Lack of Transportation (Medical): Yes    Lack of Transportation (Non-Medical): Yes  Physical Activity: Not on file  Stress: Not on file  Social Connections: Unknown (12/10/2024)   Social Connection and Isolation Panel    Frequency of Communication with Friends and Family: Three times a week    Frequency of Social Gatherings with Friends and Family: Once a week    Attends Religious Services: 1 to 4 times per year    Active Member  of Clubs or Organizations: No    Attends Banker Meetings: 1 to 4 times per year    Marital Status: Patient declined  Depression (EYV7-0): Not on file  Alcohol Screen: High Risk (04/21/2024)   Alcohol Screen    Last Alcohol Screening Score (AUDIT): 29  Housing: High Risk (12/25/2024)   Epic    Unable to Pay for Housing in the Last Year: Yes    Number of Times Moved in the Last Year: 0    Homeless in the Last Year: No  Utilities: Not At Risk (12/25/2024)   Epic    Threatened with loss of utilities: No  Recent Concern: Utilities - At Risk (12/10/2024)   Epic    Threatened with loss of utilities: Yes  Health Literacy: Not on file   Education Assessment and Provision:  Detailed  education and instructions provided on heart failure disease management including the following:  Signs and symptoms of Heart Failure When to call the physician Importance of daily weights Low sodium diet Fluid restriction Medication management Anticipated future follow-up appointments  Patient education given on each of the above topics.  Patient acknowledges understanding via teach back method and acceptance of all instructions.  Education Materials:  Living Better With Heart Failure Booklet, HF zone tool, & Daily Weight Tracker Tool.  Patient has scale at home: No.  Patient is unable to perform daily weights. Patient has pill box at home: No.      High Risk Criteria for Readmission and/or Poor Patient Outcomes: Heart failure hospital admissions (last 6 months): 4  No Show rate: 20% Difficult social situation: Recommended wheelchair for discharge. Demonstrates medication adherence: No.  Patient shared that a nurse will be hired to help him with this when he gets home. Primary Language: English Literacy level: Reading, Writing & Comprehension  Barriers of Care:   Unable to perform daily weights due to immobility.Sent home with a wheelchair for discharge. Tobacco Cessation Diet & Fluid Restrictions  Considerations/Referrals:  Referral made to Heart Failure Pharmacist Stewardship: Yes Referral made to Heart Failure CSW/NCM TOC: No Referral made to Heart & Vascular TOC clinic: Yes . [facility] 01/03/25 @ 9:45 AM   Items for Follow-up on DC/TOC: Medication Compliance Diet & Fluid Restrictions Smoking Cessation Continued Heart Failure Education  Charmaine Pines, RN, BSN [facility] Heart Failure Navigator Secure Chat Only

## 2024-12-28 NOTE — Discharge Summary (Signed)
 " [name]  Physician Discharge Summary   Patient ID: Taylor Obrien MRN: [id number] DOB/AGE: 03/07/1966 59 y.o.  Admit date: 12/24/2024 Discharge date: 12/28/2024    PCP: Trudy Ave, NP  DISCHARGE DIAGNOSES:    Mixed hyperlipidemia   Tobacco abuse   Stage 3a chronic kidney disease (CKD) (HCC)   Type 2 diabetes mellitus with hyperglycemia (HCC)   Acute systolic heart failure (HCC)   CKD (chronic kidney disease), stage IV (HCC)   Demand ischemia (HCC)   RECOMMENDATIONS FOR OUTPATIENT FOLLOW UP: Outpatient follow-up with cardiology. Patient has an appointment with her primary care provider tomorrow He has been told that he will benefit from being seen by nephrology in the outpatient setting as well.  Home Health: PT OT RN Equipment/Devices: None  CODE STATUS: Full code code  DISCHARGE CONDITION: fair  Diet recommendation: As before  INITIAL HISTORY: 59 y.o. male, with medical history of HFrEF (EF 30-35%), CKD4 (Cr ~4.0), prior CVA, PAD, and T2DM with recent hospitalization x 3 within the last month, just discharged 1//2026, multiple admissions for hypertensive emergency, NSTEMI due to demand ischemia, requiring nitro drip, aspirin , Plavix  and nitro drip for blood pressure control with multiple adjustment of his medications.  Presented again with dyspnea chest pain and low oxygen  saturations.  Patient was hospitalized for further management.  Initially required BiPAP along with nitroglycerin  and heparin  infusion.  Was slowly weaned off of BiPAP.  Transferred to [facility] for further workup.   HOSPITAL COURSE:   Demand ischemia Patient presented with pulmonary edema, concern for NSTEMI, possibly demand ischemia secondary to hypertensive emergency, and respiratory failure. Patient was started on heparin  infusion.  Patient seen by cardiology.  Due to his history of noncompliance and chronic kidney disease plan is for medical management going forward. They  think that this was demand ischemia rather than a true NSTEMI. Patient is noted to be on carvedilol , BiDil .  Plavix  was added to aspirin .  He is on statin Vascepa  and fenofibrate .  His LDL is 87.  Triglyceride 126. Discharged in stable condition.   Hypertensive emergency-resolved Mentioned that he is compliant with his medications though he has had several admissions with similar presentation.  There does appear to be a component of noncompliance. Patient was started back on his medications.  Did experience transient hypotension which is resolved.  May resume all of his home medications.   Acute respiratory failure with hypoxemia This is secondary to pulmonary edema from acute CHF and elevated blood pressure. Initially required BiPAP.  Has been weaned off of it.  Currently saturating normal on room air.  Acute on chronic HFrEF (EF 30-35%):  Ischemic cardiomyopathy Started on IV diuretics.  Changed over to torsemide  now.  On carvedilol  and BiDil .    CKd stage IV  Baseline creatinine around 4.5.  Creatinine is close to baseline.  Continue to monitor urine output.  Avoid nephrotoxic agents.  Patient mentions that he is not followed by nephrology.  He lives in the [place] area.  Can follow-up with a local nephrologist.  Information for local nephrologist provided to him.   Diabetes mellitus, type II Diet controlled.     Normocytic anemia Drop in hemoglobin is likely due to fluid shift.  No evidence of overt bleeding.  Hemoglobin is stable now.   Tobacco abuse Counseled, continue with nicotine  patch   Debility, history of CVA: Wheelchair-bound at baseline.  PT OT.      Patient is stable.  Okay for discharge home today.  Discussed with his daughter as well.   PERTINENT LABS:  The results of significant diagnostics from this hospitalization (including imaging, microbiology, ancillary and laboratory) are listed below for reference.    Microbiology: Recent Results (from the past 240  hours)  Resp panel by RT-PCR (RSV, Flu A&B, Covid) Anterior Nasal Swab     Status: None   Collection Time: 12/24/24  8:55 PM   Specimen: Anterior Nasal Swab  Result Value Ref Range Status   SARS Coronavirus 2 by RT PCR NEGATIVE NEGATIVE Final    Comment: (NOTE) SARS-CoV-2 target nucleic acids are NOT DETECTED.  The SARS-CoV-2 RNA is generally detectable in upper respiratory specimens during the acute phase of infection. The lowest concentration of SARS-CoV-2 viral copies this assay can detect is 138 copies/mL. A negative result does not preclude SARS-Cov-2 infection and should not be used as the sole basis for treatment or other patient management decisions. A negative result may occur with  improper specimen collection/handling, submission of specimen other than nasopharyngeal swab, presence of viral mutation(s) within the areas targeted by this assay, and inadequate number of viral copies(<138 copies/mL). A negative result must be combined with clinical observations, patient history, and epidemiological information. The expected result is Negative.  Fact Sheet for Patients:  bloggercourse.com  Fact Sheet for Healthcare Providers:  seriousbroker.it  This test is no t yet approved or cleared by the [place]  FDA and  has been authorized for detection and/or diagnosis of SARS-CoV-2 by FDA under an Emergency Use Authorization (EUA). This EUA will remain  in effect (meaning this test can be used) for the duration of the COVID-19 declaration under Section 564(b)(1) of the Act, 21 U.S.C.section 360bbb-3(b)(1), unless the authorization is terminated  or revoked sooner.       Influenza A by PCR NEGATIVE NEGATIVE Final   Influenza B by PCR NEGATIVE NEGATIVE Final    Comment: (NOTE) The Xpert Xpress SARS-CoV-2/FLU/RSV plus assay is intended as an aid in the diagnosis of influenza from Nasopharyngeal swab specimens and should not  be used as a sole basis for treatment. Nasal washings and aspirates are unacceptable for Xpert Xpress SARS-CoV-2/FLU/RSV testing.  Fact Sheet for Patients: bloggercourse.com  Fact Sheet for Healthcare Providers: seriousbroker.it  This test is not yet approved or cleared by the [place]  FDA and has been authorized for detection and/or diagnosis of SARS-CoV-2 by FDA under an Emergency Use Authorization (EUA). This EUA will remain in effect (meaning this test can be used) for the duration of the COVID-19 declaration under Section 564(b)(1) of the Act, 21 U.S.C. section 360bbb-3(b)(1), unless the authorization is terminated or revoked.     Resp Syncytial Virus by PCR NEGATIVE NEGATIVE Final    Comment: (NOTE) Fact Sheet for Patients: bloggercourse.com  Fact Sheet for Healthcare Providers: seriousbroker.it  This test is not yet approved or cleared by the [place]  FDA and has been authorized for detection and/or diagnosis of SARS-CoV-2 by FDA under an Emergency Use Authorization (EUA). This EUA will remain in effect (meaning this test can be used) for the duration of the COVID-19 declaration under Section 564(b)(1) of the Act, 21 U.S.C. section 360bbb-3(b)(1), unless the authorization is terminated or revoked.  Performed at [facility], [address] [place], [place] [address]      Labs:   Basic Metabolic Panel: Recent Labs  Lab 12/24/24 1712 12/25/24 0511 12/26/24 0243 12/27/24 0227 12/28/24 0312  NA 143 141 140 136 138  K 4.0 4.8 3.7 4.1 3.7  CL  105 106 105 101 103  CO2 15* 22 24 22 23   GLUCOSE 244* 107* 126* 101* 98  BUN 29* 32* 35* 34* 36*  CREATININE 3.77* 3.59* 3.93* 4.18* 4.34*  CALCIUM  9.4 8.9 8.5* 8.9 9.0   Liver Function Tests: Recent Labs  Lab 12/24/24 1712  AST 67*  ALT 21  ALKPHOS 76  BILITOT 0.3  PROT 7.8  ALBUMIN 3.9   No results  for input(s): LIPASE, AMYLASE in the last 168 hours. No results for input(s): AMMONIA in the last 168 hours. CBC: Recent Labs  Lab 12/25/24 0511 12/26/24 0243 12/27/24 0227  WBC 14.2* 7.4 6.1  HGB 11.3* 9.4* 9.1*  HCT 34.3* 27.9* 27.6*  MCV 98.3 96.2 98.2  PLT 238 195 205     ProBNP (last 3 results) Recent Labs    11/25/24 1925 12/10/24 0345 12/24/24 1712  PROBNP 5,832.0* 4,920.0* 9,603.0*    CBG: Recent Labs  Lab 12/27/24 0555 12/27/24 1111 12/27/24 1613 12/27/24 2113 12/28/24 0616  GLUCAP 96 147* 130* 151* 100*     IMAGING STUDIES ECHOCARDIOGRAM COMPLETE Result Date: 12/27/2024    ECHOCARDIOGRAM REPORT   Patient Name:   Taylor Obrien Date of Exam: 12/27/2024 Medical Rec #:  [id number]     Height:       66.0 in Accession #:    [id number]    Weight:       155.6 lb Date of Birth:  February 18, 1966     BSA:          1.798 m Patient Age:    58 years      BP:           139/73 mmHg Patient Gender: M             HR:           82 bpm. Exam Location:  Inpatient Procedure: 2D Echo, Cardiac Doppler, Color Doppler and Intracardiac            Opacification Agent (Both Spectral and Color Flow Doppler were            utilized during procedure). Indications:    NSTEMI  History:        Patient has prior history of Echocardiogram examinations, most                 recent 11/26/2024. Acute MI, Stroke; Risk Factors:Hypertension,                 Sleep Apnea and Diabetes.  Sonographer:    Carmelita Hartshorn RDCS, FE, PE Referring Phys: [id number] DARRYLE NED O'NEAL IMPRESSIONS  1. Mid/basal inferior wall hypokinesis . Left ventricular ejection fraction, by estimation, is 45 to 50%. The left ventricle has mildly decreased function. The left ventricle demonstrates regional wall motion abnormalities (see scoring diagram/findings for description). The left ventricular internal cavity size was mildly dilated. There is mild left ventricular hypertrophy. Left ventricular diastolic parameters are consistent with  Grade I diastolic dysfunction (impaired relaxation).  2. Right ventricular systolic function is normal. The right ventricular size is normal.  3. The mitral valve is abnormal. Trivial mitral valve regurgitation. No evidence of mitral stenosis.  4. The aortic valve is tricuspid. There is mild calcification of the aortic valve. Aortic valve regurgitation is not visualized. Aortic valve sclerosis is present, with no evidence of aortic valve stenosis.  5. The inferior vena cava is normal in size with greater than 50% respiratory variability, suggesting right atrial pressure of 3 mmHg. FINDINGS  Left Ventricle: Mid/basal inferior wall hypokinesis. Left ventricular ejection fraction, by estimation, is 45 to 50%. The left ventricle has mildly decreased function. The left ventricle demonstrates regional wall motion abnormalities. Definity  contrast  agent was given IV to delineate the left ventricular endocardial borders. Strain was performed and the global longitudinal strain is indeterminate. The left ventricular internal cavity size was mildly dilated. There is mild left ventricular hypertrophy.  Left ventricular diastolic parameters are consistent with Grade I diastolic dysfunction (impaired relaxation). Right Ventricle: The right ventricular size is normal. No increase in right ventricular wall thickness. Right ventricular systolic function is normal. Left Atrium: Left atrial size was normal in size. Right Atrium: Right atrial size was normal in size. Pericardium: There is no evidence of pericardial effusion. Mitral Valve: The mitral valve is abnormal. There is mild thickening of the mitral valve leaflet(s). Trivial mitral valve regurgitation. No evidence of mitral valve stenosis. Tricuspid Valve: The tricuspid valve is normal in structure. Tricuspid valve regurgitation is trivial. No evidence of tricuspid stenosis. Aortic Valve: The aortic valve is tricuspid. There is mild calcification of the aortic valve. Aortic  valve regurgitation is not visualized. Aortic valve sclerosis is present, with no evidence of aortic valve stenosis. Aortic valve mean gradient measures 3.0 mmHg. Aortic valve peak gradient measures 5.6 mmHg. Aortic valve area, by VTI measures 2.64 cm. Pulmonic Valve: The pulmonic valve was normal in structure. Pulmonic valve regurgitation is trivial. No evidence of pulmonic stenosis. Aorta: The aortic root is normal in size and structure. Venous: The inferior vena cava is normal in size with greater than 50% respiratory variability, suggesting right atrial pressure of 3 mmHg. IAS/Shunts: No atrial level shunt detected by color flow Doppler. Additional Comments: 3D was performed not requiring image post processing on an independent workstation and was indeterminate.  LEFT VENTRICLE PLAX 2D LVIDd:         4.90 cm      Diastology LVIDs:         4.30 cm      LV e' medial:    4.35 cm/s LV PW:         1.05 cm      LV E/e' medial:  14.9 LV IVS:        1.25 cm      LV e' lateral:   5.27 cm/s LVOT diam:     2.00 cm      LV E/e' lateral: 12.3 LV SV:         51 LV SV Index:   28 LVOT Area:     3.14 cm  LV Volumes (MOD) LV vol d, MOD A2C: 175.0 ml LV vol d, MOD A4C: 174.0 ml LV vol s, MOD A2C: 117.0 ml LV vol s, MOD A4C: 105.0 ml LV SV MOD A2C:     58.0 ml LV SV MOD A4C:     174.0 ml LV SV MOD BP:      64.7 ml RIGHT VENTRICLE RV Basal diam:  3.20 cm RV Mid diam:    3.00 cm RV S prime:     10.80 cm/s TAPSE (M-mode): 1.6 cm LEFT ATRIUM             Index        RIGHT ATRIUM           Index LA Vol (A2C):   75.2 ml 41.83 ml/m  RA Area:     19.60 cm LA Vol (A4C):   75.2 ml 41.83 ml/m  RA Volume:  54.00 ml  30.04 ml/m LA Biplane Vol: 75.2 ml 41.83 ml/m  AORTIC VALVE AV Area (Vmax):    2.62 cm AV Area (Vmean):   2.53 cm AV Area (VTI):     2.64 cm AV Vmax:           118.00 cm/s AV Vmean:          76.200 cm/s AV VTI:            0.194 m AV Peak Grad:      5.6 mmHg AV Mean Grad:      3.0 mmHg LVOT Vmax:         98.40 cm/s LVOT  Vmean:        61.400 cm/s LVOT VTI:          0.163 m LVOT/AV VTI ratio: 0.84  AORTA Ao Root diam: 3.30 cm Ao Asc diam:  3.10 cm MITRAL VALVE                TRICUSPID VALVE MV Area (PHT): 3.72 cm     TR Peak grad:   2.3 mmHg MV Decel Time: 204 msec     TR Vmax:        75.60 cm/s MV E velocity: 64.90 cm/s MV A velocity: 101.00 cm/s  SHUNTS MV E/A ratio:  0.64         Systemic VTI:  0.16 m                             Systemic Diam: 2.00 cm Maude Emmer MD Electronically signed by Maude Emmer MD Signature Date/Time: 12/27/2024/12:42:36 PM    Final    DG Chest Port 1 View Result Date: 12/24/2024 EXAM: 1 VIEW(S) XRAY OF THE CHEST 12/24/2024 05:20:26 PM COMPARISON: 12/10/2024 CLINICAL HISTORY: sob FINDINGS: LUNGS AND PLEURA: Interstitial prominence throughout the lungs, improved since prior study but likely reflecting residual or recurrent pulmonary edema/chf. No pleural effusion. No pneumothorax. HEART AND MEDIASTINUM: Mild cardiomegaly. BONES AND SOFT TISSUES: No acute osseous abnormality. IMPRESSION: 1. Interstitial prominence throughout the lungs, improved since prior study, likely reflecting residual or recurrent pulmonary edema/CHF. Electronically signed by: Franky Crease MD 12/24/2024 05:44 PM EST RP Workstation: [id number]   US  Venous Img Lower Bilateral (DVT) Result Date: 12/10/2024 CLINICAL DATA:  BILATERAL lower extremity edema. Shortness of breath on admission. EXAM: Bilateral Lower Extremity Venous Doppler Ultrasound TECHNIQUE: Gray-scale sonography with compression, as well as color and duplex ultrasound, were performed to evaluate the deep venous system(s) from the level of the common femoral vein through the popliteal and proximal calf veins. COMPARISON:  01/17/2018 FINDINGS: VENOUS Normal compressibility of the common femoral, femoral, and popliteal veins, as well as the visualized calf veins. Visualized portions of profunda femoral vein and great saphenous vein unremarkable. No filling defects to  suggest DVT on grayscale or color Doppler imaging. Doppler waveforms show normal direction of venous flow, normal respiratory plasticity and response to augmentation. OTHER None. Limitations: none IMPRESSION: No lower extremity DVT. Electronically Signed   By: Aliene Lloyd M.D.   On: 12/10/2024 16:10   DG Chest Port 1 View Result Date: 12/10/2024 EXAM: 1 VIEW(S) XRAY OF THE CHEST 12/10/2024 04:07:20 AM COMPARISON: 11/25/2024 CLINICAL HISTORY: shortness of breath FINDINGS: LUNGS AND PLEURA: Diffuse interstitial and airspace opacities. No pleural effusion. No pneumothorax. HEART AND MEDIASTINUM: Cardiomegaly. Atherosclerotic calcifications. BONES AND SOFT TISSUES: No acute osseous abnormality. CHEST WALL AND UPPER ABDOMEN: Gaseous distension of stomach. IMPRESSION: 1.  Diffuse interstitial and airspace opacities compatible with pulmonary edema, likely due to congestive heart failure. 2. Cardiomegaly. Electronically signed by: Waddell Calk MD 12/10/2024 05:08 AM EST RP Workstation: [id number]    DISCHARGE EXAMINATION: Vitals:   12/28/24 0427 12/28/24 0500 12/28/24 0600 12/28/24 0725  BP:    (!) 147/94  Pulse: 84 71 83 80  Resp: 15 13 15 15   Temp:    98 F (36.7 C)  TempSrc:    Oral  SpO2: 97% 98% 96% 98%  Weight:      Height:       General appearance: Awake alert.  In no distress Resp: Clear to auscultation bilaterally.  Normal effort Cardio: S1-S2 is normal regular.  No S3-S4.  No rubs murmurs or bruit GI: Abdomen is soft.  Nontender nondistended.  Bowel sounds are present normal.  No masses organomegaly  DISPOSITION: Home  Discharge Instructions     (HEART FAILURE PATIENTS) Call MD:  Anytime you have any of the following symptoms: 1) 3 pound weight gain in 24 hours or 5 pounds in 1 week 2) shortness of breath, with or without a dry hacking cough 3) swelling in the hands, feet or stomach 4) if you have to sleep on extra pillows at night in order to breathe.   Complete by: As directed     Avoid straining   Complete by: As directed    Call MD for:  difficulty breathing, headache or visual disturbances   Complete by: As directed    Call MD for:  extreme fatigue   Complete by: As directed    Call MD for:  persistant dizziness or light-headedness   Complete by: As directed    Call MD for:  persistant nausea and vomiting   Complete by: As directed    Call MD for:  severe uncontrolled pain   Complete by: As directed    Call MD for:  temperature >100.4   Complete by: As directed    Diet - low sodium heart healthy   Complete by: As directed    Heart Failure patients record your daily weight using the same scale at the same time of day   Complete by: As directed    Increase activity slowly   Complete by: As directed    STOP any activity that causes chest pain, shortness of breath, dizziness, sweating, or exessive weakness   Complete by: As directed         Allergies as of 12/28/2024       Reactions   Ibuprofen  Other (See Comments)   Avoid per PCP   Oxcarbazepine Other (See Comments)   Patient goes out of right state of mind.         Medication List     STOP taking these medications    losartan  50 MG tablet Commonly known as: COZAAR        TAKE these medications    acetaminophen  325 MG tablet Commonly known as: TYLENOL  Take 2 tablets (650 mg total) by mouth every 6 (six) hours as needed for mild pain (pain score 1-3) or fever (or Fever >/= 101).   aspirin  EC 81 MG tablet Take 1 tablet (81 mg total) by mouth daily with breakfast.   atorvastatin  80 MG tablet Commonly known as: LIPITOR  Take 1 tablet (80 mg total) by mouth daily.   calcitRIOL  0.25 MCG capsule Commonly known as: ROCALTROL  Take 1 capsule (0.25 mcg total) by mouth every Monday, Wednesday, and Friday.   carvedilol  25 MG tablet  Commonly known as: COREG  Take 1 tablet (25 mg total) by mouth 2 (two) times daily with a meal.   clopidogrel  75 MG tablet Commonly known as: PLAVIX  Take 1  tablet (75 mg total) by mouth daily.   escitalopram  20 MG tablet Commonly known as: LEXAPRO  Take 1 tablet (20 mg total) by mouth daily.   fenofibrate  145 MG tablet Commonly known as: TRICOR  Take 145 mg by mouth daily.   isosorbide -hydrALAZINE  20-37.5 MG tablet Commonly known as: BIDIL  Take 2 tablets by mouth 3 (three) times daily.   lidocaine  5 % Commonly known as: LIDODERM  Place 2 patches onto the skin daily. Remove & Discard patch within 12 hours or as directed by MD What changed:  when to take this reasons to take this   loperamide  2 MG capsule Commonly known as: IMODIUM  Take 1 capsule (2 mg total) by mouth as needed for diarrhea or loose stools (Loose stools/Diarrhea).   melatonin 5 MG Tabs Take 5 mg by mouth at bedtime.   methocarbamol  500 MG tablet Commonly known as: ROBAXIN  Take 500 mg by mouth every 8 (eight) hours as needed.   oxyCODONE  5 MG immediate release tablet Commonly known as: Oxy IR/ROXICODONE  Take 1 tablet (5 mg total) by mouth every 6 (six) hours as needed for severe pain (pain score 7-10) or breakthrough pain.   pantoprazole  40 MG tablet Commonly known as: PROTONIX  Take 1 tablet (40 mg total) by mouth daily.   potassium chloride  10 MEQ tablet Commonly known as: KLOR-CON  Take 10 mEq by mouth daily.   torsemide  20 MG tablet Commonly known as: DEMADEX  Take 2 tablets (40 mg total) by mouth daily. What changed:  how much to take when to take this   Vascepa  1 g capsule Generic drug: icosapent  Ethyl Take 2 g by mouth 2 (two) times daily.               Durable Medical Equipment  (From admission, onward)           Start     Ordered   12/27/24 1530  For home use only DME lightweight manual wheelchair with seat cushion  Once       Comments: Patient suffers from weakness which impairs their ability to perform daily activities like bathing, dressing, grooming, and toileting in the home.  A cane or walker will not resolve  issue with  performing activities of daily living. A wheelchair will allow patient to safely perform daily activities. Patient is not able to propel themselves in the home using a standard weight wheelchair due to general weakness. Patient can self propel in the lightweight wheelchair. Length of need Lifetime. Accessories: elevating leg rests (ELRs), wheel locks, extensions and anti-tippers.   12/27/24 1529              Contact information for follow-up providers     Bhutani, Gaynell RAMAN, MD. Schedule an appointment as soon as possible for a visit.   Specialty: Nephrology Why: Please call to schedule appointment for your chronic kidney disease Contact information: [address] [place] [address] [phone]         [name] ORN, FNP Follow up on 12/29/2024.   Specialty: Family Medicine Why: 2 pm for new patient apt and hospital follow up Contact information: [address] [place] [address] [phone]         [facility] (DME) Follow up.   Specialty: DME Services Why: light weight wchair Contact information: [address] [place]  [address] [phone]         [facility]. Go on 01/03/2025.   Specialty: Cardiology Why: Hospital Follow-Up 01/03/25 @ 9:45 AM Please bring all medications to follow-up appointment [facility], Entrance C off of [address] [place] Parking at the door or use [employer] [address]  to park under the building. Contact information: [address] [place]  [address] [phone]             Contact information for after-discharge care     Home Medical Care     [facility]) .   Service: Home Health Services Why: Agency will call you to set up apt times Contact information: [address] [place]  [phone]                     TOTAL DISCHARGE TIME: 35  minutes  Murvin Gift Verdene  [name] Pager on [email or web address]  12/28/2024, 10:42 AM    "

## 2024-12-28 NOTE — TOC Transition Note (Signed)
 Transition of Care [facility]) - Discharge Note   Patient Details  Name: Taylor Obrien MRN: [id number] Date of Birth: 12/01/1966  Transition of Care [facility] CM/SW Contact:  Waddell Barnie Rama, RN Phone Number: 12/28/2024, 9:17 AM   Clinical Narrative:    For dc today, he has transportation home.  Follow up apt on AVS. NCM notified [facility].   Final next level of care: Home w Home Health Services Barriers to Discharge: Continued Medical Work up   Patient Goals and CMS Choice Patient states their goals for this hospitalization and ongoing recovery are:: return home with daughter and son CMS Medicare.gov Compare Post Acute Care list provided to:: Patient Choice offered to / list presented to : Patient      Discharge Placement                       Discharge Plan and Services Additional resources added to the After Visit Summary for   In-house Referral: NA Discharge Planning Services: CM Consult Post Acute Care Choice: Home Health          DME Arranged: Wheelchair manual DME Agency: [facility] Date DME Agency Contacted: 12/27/24 Time DME Agency Contacted: 1531 Representative spoke with at DME Agency: Ryan [facility] Arranged: RN, PT, OT [facility] Agency: [facility] Agency Contacted: 12/27/24 Time HH Agency Contacted: 1536 Representative spoke with at [facility] Agency: Adriana  Social Drivers of Health (SDOH) Interventions SDOH Screenings   Food Insecurity: Food Insecurity Present (12/25/2024)  Housing: High Risk (12/25/2024)  Transportation Needs: Unmet Transportation Needs (12/25/2024)  Utilities: Not At Risk (12/25/2024)  Recent Concern: Utilities - At Risk (12/10/2024)  Alcohol Screen: High Risk (04/21/2024)  Social Connections: Unknown (12/10/2024)  Tobacco Use: High Risk (12/27/2024)     Readmission Risk Interventions    12/27/2024    3:24 PM 12/10/2024   12:43 PM 11/26/2024   11:27 AM  Readmission Risk Prevention Plan  Transportation Screening Complete  Complete Complete  Medication Review Oceanographer) Complete Complete Complete  PCP or Specialist appointment within 3-5 days of discharge Complete  Complete  HRI or Home Care Consult Complete Complete Complete  SW Recovery Care/Counseling Consult  Complete Complete  Palliative Care Screening Not Applicable Not Applicable Not Applicable  Skilled Nursing Facility Not Applicable Not Applicable Not Applicable

## 2024-12-28 NOTE — Telephone Encounter (Signed)
 Called and spoke with Dana from [facility]. Made her aware that the patient is not established with us  yet so we can't say whether new provider is going to be the one listed for skilled nursing and PT. Will have to wait to see if patient comes in for his new patient appt and go from there. Lonell voiced understanding. Aware that I will reach out to her either way, tomorrow, to let her know if patient was seen or not.    Copied from CRM [id number]. Topic: Appointments - Scheduling Inquiry for Clinic >> Dec 28, 2024  2:11 PM Selinda RAMAN wrote: Reason for CRM: Lonell with [facility] called in stating they received initial orders from the hospital but need to know if the patients new provider will be the one listed for follow up orders? She says this will be for skilled nursing as well as PT and OT. She says she would appreciate a call to her [phone] number to let her know and then she will fax over any future orders. She wanted the provider to know so he can sign off on it during the patients appointment tomorrow afternoon 12/29/24. Please assist patient further.

## 2024-12-28 NOTE — Care Management Important Message (Signed)
 Important Message  Patient Details  Name: Taylor Obrien MRN: [id number] Date of Birth: 11-08-1966   Important Message Given:  Yes - Medicare IM     [name] Sharps 12/28/2024, 8:05 AM

## 2024-12-29 ENCOUNTER — Other Ambulatory Visit (HOSPITAL_COMMUNITY): Payer: Self-pay

## 2024-12-29 ENCOUNTER — Encounter: Payer: Self-pay | Admitting: Family Medicine

## 2024-12-29 ENCOUNTER — Ambulatory Visit: Admitting: Family Medicine

## 2024-12-29 VITALS — BP 191/102 | HR 85 | Temp 97.4°F

## 2024-12-29 DIAGNOSIS — K219 Gastro-esophageal reflux disease without esophagitis: Secondary | ICD-10-CM

## 2024-12-29 DIAGNOSIS — I10 Essential (primary) hypertension: Secondary | ICD-10-CM | POA: Diagnosis not present

## 2024-12-29 DIAGNOSIS — E1122 Type 2 diabetes mellitus with diabetic chronic kidney disease: Secondary | ICD-10-CM

## 2024-12-29 DIAGNOSIS — G8929 Other chronic pain: Secondary | ICD-10-CM | POA: Diagnosis not present

## 2024-12-29 DIAGNOSIS — I69354 Hemiplegia and hemiparesis following cerebral infarction affecting left non-dominant side: Secondary | ICD-10-CM | POA: Diagnosis not present

## 2024-12-29 DIAGNOSIS — I5022 Chronic systolic (congestive) heart failure: Secondary | ICD-10-CM

## 2024-12-29 DIAGNOSIS — F32A Depression, unspecified: Secondary | ICD-10-CM | POA: Diagnosis not present

## 2024-12-29 DIAGNOSIS — N184 Chronic kidney disease, stage 4 (severe): Secondary | ICD-10-CM

## 2024-12-29 NOTE — Progress Notes (Signed)
 "  New Patient Office Visit  Patient ID: Taylor Obrien, Male   DOB: September 13, 1966 59 y.o. MRN: [id number]  Chief Complaint  Patient presents with   Establish Care   Hospitalization Follow-up    Patient has been in and out of the hospital for heart failure, respiratory failure, and kidney failure. Patient most recently discharged from [facility] on 12/28/2024.   Subjective:     Taylor Obrien presents to establish care  HPI  Discussed the use of AI scribe software for clinical note transcription with the patient, who gave verbal consent to proceed.  History of Present Illness   Taylor Obrien is a 59 year old male here today to establish care.   Ra Pfiester daughter present [phone]    Patient discharged from the hospital yesterday 12/28/2024.  Patient has been admitted and discharged from the hospital several times over the past month due to hypertensive emergencies and acute on chronic congestive heart failure disease.   - History of nonadherence with medications with numerous ED visits over the past several years.   Most recent admission was on 12/24/2024 for acute decompensated heart failure.  The note states that patient has been in and out of the hospital several times over the past month for hypertensive emergency, non-STEMI due to demand ischemia that required a nitro drip, aspirin , Plavix .  This most recent admission was for dyspnea chest pain and low oxygen  saturations. Patient was hospitalized for further management.  Initially required BiPAP with nitro and heparin  infusions.  Patient was eventually weaned off BiPAP.  The note states that they believe that the patient experienced demand ischemia rather than a true NSTEMI.  He was discharged in stable conditions per note.  Acute on chronic heart failure with EF of 30 to 35%.  Stage IV kidney disease.  Diabetes type 2 diet controlled.  Normocytic anemia with drop in hemoglobin likely due to fluid shift, notes state that  hemoglobin was stable.  Stroke - Persistent left-sided weakness following stroke in 2016, affecting the leg and resulting in inability to ambulate - Wheelchair bound   Hypertension and medication adherence - History of nonadherence with medications with numerous ED visits over the past several years.  - Did not take blood pressure medication today. BP is elevated at the office today.  - Uncertain if a blood pressure monitor is available at home - Denies chest pain or shortness of breath  Chronic heart failure - History of chronic heart failure - Has appointment with cardiology on 01/03/2025  Type 2 diabetes mellitus - History of type 2 diabetes, currently controlled without medication.  - Not currently on diabetes medication - Last hemoglobin A1c one month ago was 5.8   Chronic kidney disease - History of chronic kidney disease - BMP from yesterday revealed a creatinine of 4.34 and GFR of 15 - Patient is not followed by nephrology  Pain and analgesic use - Takes Tylenol  almost daily for pain control, reports being aware of potential hepatic risks when I discussed them with him.  - Patient was previously on oxycodone  but has not been prescribed it for several months. - Patient states that his pain is all over his body and is usually 7 out of 10  Current medications include:  - aspirin  81 mg daily - Lipitor  80 mg  daily - Coreg  25 mg twice daily,  - clopidogrel  75 mg daily - Lexapro  20 mg daily - Calcitriol  mcg mon, wed, Friday - fenofibrate  145 mg  day - isosorbide  hydralazine  20-37.5 take 2 tablets by mouth 3 times daily  - Robaxin  500 mg by mouth every 8 hours as needed   - Protonix  40 mg by mouth daily - Potassium chloride  10 mEq by mouth daily - Torsemide  40 mg by mouth daily - Vascepa  2 g by mouth 2 times daily   Outpatient Encounter Medications as of 12/29/2024  Medication Sig   acetaminophen  (TYLENOL ) 325 MG tablet Take 2 tablets (650 mg total) by mouth every 6  (six) hours as needed for mild pain (pain score 1-3) or fever (or Fever >/= 101).   aspirin  EC 81 MG tablet Take 1 tablet (81 mg total) by mouth daily with breakfast.   atorvastatin  (LIPITOR ) 80 MG tablet Take 1 tablet (80 mg total) by mouth daily.   calcitRIOL  (ROCALTROL ) 0.25 MCG capsule Take 1 capsule (0.25 mcg total) by mouth every Monday, Wednesday, and Friday.   carvedilol  (COREG ) 25 MG tablet Take 1 tablet (25 mg total) by mouth 2 (two) times daily with a meal.   clopidogrel  (PLAVIX ) 75 MG tablet Take 1 tablet (75 mg total) by mouth daily.   escitalopram  (LEXAPRO ) 20 MG tablet Take 1 tablet (20 mg total) by mouth daily.   fenofibrate  (TRICOR ) 145 MG tablet Take 145 mg by mouth daily.   isosorbide -hydrALAZINE  (BIDIL ) 20-37.5 MG tablet Take 2 tablets by mouth 3 (three) times daily.   lidocaine  (LIDODERM ) 5 % Place 2 patches onto the skin daily. Remove & Discard patch within 12 hours or as directed by MD (Patient taking differently: Place 2 patches onto the skin daily as needed (Pain). Remove & Discard patch within 12 hours or as directed by MD)   loperamide  (IMODIUM ) 2 MG capsule Take 1 capsule (2 mg total) by mouth as needed for diarrhea or loose stools (Loose stools/Diarrhea).   melatonin 5 MG TABS Take 5 mg by mouth at bedtime.   methocarbamol  (ROBAXIN ) 500 MG tablet Take 500 mg by mouth every 8 (eight) hours as needed.   oxyCODONE  (OXY IR/ROXICODONE ) 5 MG immediate release tablet Take 1 tablet (5 mg total) by mouth every 6 (six) hours as needed for severe pain (pain score 7-10) or breakthrough pain.   pantoprazole  (PROTONIX ) 40 MG tablet Take 1 tablet (40 mg total) by mouth daily.   potassium chloride  (KLOR-CON ) 10 MEQ tablet Take 10 mEq by mouth daily.   torsemide  (DEMADEX ) 20 MG tablet Take 2 tablets (40 mg total) by mouth daily.   VASCEPA  1 g capsule Take 2 g by mouth 2 (two) times daily.   No facility-administered encounter medications on file as of 12/29/2024.    Past Medical  History:  Diagnosis Date   Acute alcoholic pancreatitis    Arthritis    CHF (congestive heart failure) (HCC)    Chronic pain    Depression    Diabetes mellitus without complication (HCC)    Essential hypertension, benign    GERD (gastroesophageal reflux disease)    Headache    HTN (hypertension) 11/27/2015   Hyperlipidemia    Peripheral vascular disease    Sleep apnea    could not tolerate CPAP   Stroke (HCC)    2016    Past Surgical History:  Procedure Laterality Date   BIOPSY  08/19/2022   Procedure: BIOPSY;  Surgeon: Cindie Carlin POUR, DO;  [facility]: [facility];  Service: Endoscopy;;   CLOSED REDUCTION MANDIBULAR FRACTURE W/ ARCH BARS     + multiple extractions   COLONOSCOPY WITH PROPOFOL   N/A 08/19/2022   Procedure: COLONOSCOPY WITH PROPOFOL ;  Surgeon: Cindie Carlin POUR, DO;  Location: [facility];  Service: Endoscopy;  Laterality: N/A;  9:15am   Condyloma resection     ESOPHAGOGASTRODUODENOSCOPY (EGD) WITH PROPOFOL  N/A 08/19/2022   Procedure: ESOPHAGOGASTRODUODENOSCOPY (EGD) WITH PROPOFOL ;  Surgeon: Cindie Carlin POUR, DO;  [facility];  Service: Endoscopy;  Laterality: N/A;   MULTIPLE EXTRACTIONS WITH ALVEOLOPLASTY Bilateral 01/23/2018   Procedure: DENTAL EXTRACTION OF TEETH NUMBER ONE, TWO, THREE, FOUR, FIVE, SIX, SEVEN, EIGHT, NINE, TEN, ELEVEN, TWELVE, THIRTEEN, FOURTEEN, FIFTEEN, SIXTEEN, SEVENTEEN, TWENTY, TWENTY-ONE, TWENTY-TWO, TWENTY-THREE, TWENTY-FOUR, TWENTY-FIVE, TWENTY-SIX, TWENTY-SEVEN, TWENTY-EIGHT, TWENTY-NINE, THIRTY-TWO WITH ALVEOLOPLASTY;  Surgeon: Sheryle Hamilton, DDS;  Location: [facility] OR;  Service: Oral Surgery;  Lat   POLYPECTOMY  08/19/2022   Procedure: POLYPECTOMY;  Surgeon: Cindie Carlin POUR, DO;  [facility]: [facility];  Service: Endoscopy;;   RADIOLOGY WITH ANESTHESIA N/A 11/18/2015   Procedure: RADIOLOGY WITH ANESTHESIA;  Surgeon: Thyra Nash, MD;  Location: [facility] OR;  Service: Radiology;  Laterality: N/A;   Removal foreign body right  shoulder     Right rotator cuff repair     TEE WITHOUT CARDIOVERSION N/A 11/21/2015   Procedure: TRANSESOPHAGEAL ECHOCARDIOGRAM (TEE);  Surgeon: Ezra GORMAN Shuck, MD;  Location: [facility] ENDOSCOPY;  Service: Cardiovascular;  Laterality: N/A;   TOOTH EXTRACTION N/A 01/24/2018   Procedure: SUTURE ORAL WOUND;  Surgeon: Sheryle Hamilton, DDS;  Location: [facility] OR;  Service: Oral Surgery;  Laterality: N/A;    Family History  Problem Relation Age of Onset   Diabetes Mother    Hypertension Mother    Drug abuse Mother    Hyperlipidemia Mother    Diabetes Father    Hypertension Father    Hyperlipidemia Father    Diabetes Brother    Hypertension Brother     Social History   Socioeconomic History   Marital status: Married    Spouse name: Not on file   Number of children: Not on file   Years of education: Not on file   Highest education level: Not on file  Occupational History   Not on file  Tobacco Use   Smoking status: Every Day    Current packs/day: 0.50    Average packs/day: 0.5 packs/day for 30.0 years (15.0 ttl pk-yrs)    Types: Cigarettes   Smokeless tobacco: Never  Vaping Use   Vaping status: Never Used  Substance and Sexual Activity   Alcohol use: Not Currently    Comment: 2 40 oz daily. 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 Stopped liquor 5 years ago.states no etoh since 03/2022   Drug use: Yes    Types: Marijuana    Comment: occassionally   Sexual activity: Yes    Birth control/protection: None  Other Topics Concern   Not on file  Social History Narrative   Not on file   Social Drivers of Health   Tobacco Use: High Risk (12/29/2024)   Patient History    Smoking Tobacco Use: Every Day    Smokeless Tobacco Use: Never    Passive Exposure: Not on file  Financial Resource Strain: Not on file  Food Insecurity: Food Insecurity Present (12/25/2024)   Epic    Worried About Programme Researcher, Broadcasting/film/video in the Last Year: Often true    [employer] of Food in the Last Year: Often true   Transportation Needs: Unmet Transportation Needs (12/25/2024)   Epic    Lack of Transportation (Medical): Yes    Lack of Transportation (Non-Medical):  Yes  Physical Activity: Not on file  Stress: Not on file  Social Connections: Unknown (12/10/2024)   Social Connection and Isolation Panel    Frequency of Communication with Friends and Family: Three times a week    Frequency of Social Gatherings with Friends and Family: Once a week    Attends Religious Services: 1 to 4 times per year    Active Member of Clubs or Organizations: No    Attends Banker Meetings: 1 to 4 times per year    Marital Status: Patient declined  Intimate Partner Violence: Not At Risk (12/25/2024)   Epic    Fear of Current or Ex-Partner: No    Emotionally Abused: No    Physically Abused: No    Sexually Abused: No  Depression (PHQ2-9): Medium Risk (12/29/2024)   Depression (PHQ2-9)    PHQ-2 Score: 9  Alcohol Screen: High Risk (04/21/2024)   Alcohol Screen    Last Alcohol Screening Score (AUDIT): 29  Housing: High Risk (12/25/2024)   Epic    Unable to Pay for Housing in the Last Year: Yes    Number of Times Moved in the Last Year: 0    Homeless in the Last Year: No  Utilities: Not At Risk (12/25/2024)   Epic    Threatened with loss of utilities: No  Recent Concern: Utilities - At Risk (12/10/2024)   Epic    Threatened with loss of utilities: Yes  Health Literacy: Not on file    ROS    Objective:    BP (!) 191/102   Pulse 85   Temp (!) 97.4 F (36.3 C)   SpO2 99%   Physical Exam Vitals reviewed.  Constitutional:      Appearance: Normal appearance.  HENT:     Head: Normocephalic and atraumatic.  Eyes:     Extraocular Movements: Extraocular movements intact.     Conjunctiva/sclera: Conjunctivae normal.     Pupils: Pupils are equal, round, and reactive to light.  Cardiovascular:     Rate and Rhythm: Normal rate and regular rhythm.     Pulses: Normal pulses.     Heart sounds: Normal  heart sounds. No murmur heard. Pulmonary:     Effort: Pulmonary effort is normal. No respiratory distress.     Breath sounds: Normal breath sounds.  Musculoskeletal:        General: No swelling.     Cervical back: Normal range of motion.     Comments: Wheelchair bound.  Unable to move [name].   Skin:    General: Skin is warm and dry.  Neurological:     General: No focal deficit present.     Mental Status: He is alert and oriented to person, place, and time.     Motor: Weakness present.  Psychiatric:        Mood and Affect: Mood normal.        Behavior: Behavior normal.           Assessment & Plan:   Problem List Items Addressed This Visit       Digestive   GERD (gastroesophageal reflux disease)     Endocrine   Diabetes mellitus (HCC)     Nervous and Auditory   Hemiparesis affecting left side as late effect of stroke (HCC)     Other   Depression   Other Visit Diagnoses       Chronic heart failure with reduced ejection fraction (HFrEF, <= 40%) (HCC)    -  Primary  Chronic kidney disease, stage 4 (severe) (HCC)       Relevant Orders   Ambulatory referral to Nephrology     Elevated blood pressure reading with diagnosis of hypertension         Other chronic pain           Assessment and Plan    Chronic left-sided hemiparesis since 2016 stroke - Home health ordered placed by hospital - Patient lives at home with children - Daughter present today reports that she plans on bringing the patient to appointments.  - Home health is getting the patient set up for nursing eval, PT, and OT.   Chronic heart failure - Attend cardiology appointment on January 26th, 2026 - Discussed importance of attending this appointment - Continue torsemide , carvedilol , and BiDil .   Hypertension - Poorly controlled, likely due to medication non-adherence. Elevated blood pressure today.   - Patient reports that he did not take his blood pressure medicine today - Patient denied  any chest pain or shortness of breath today - Discussed the risks associated with medication nonadherence and elevated blood pressures including but not limited to stroke, MI, death. - Instructed to take blood pressure medications as prescribed when the patient gets home today.  - Daughter reports that she will purchase a blood pressure cuff today and check the blood pressure at home - Discussed seeking immediate medical attention via ED for chest pain or shortness of breath. - Discussed letting me know if the blood pressure is is greater than or equal to 180/100 when taking blood pressure medication as prescribed. - Offered appointment tomorrow to recheck blood pressure in office but the patient declined.  The daughter reports that she will check the blood pressure at home.  - Attend cardiology appointment on January 03, 2025  Chronic kidney disease stage 4 - Urgent referral to nephrologist Dr. Archie for further evaluation and management. - Patient reports that he is making good urine - Continue to monitor urine output  Type 2 diabetes mellitus - Last A1c 5.8 about 1 month ago without being on medication.   Home health - Ordered by hospital.   Chronic pain - Continue Robaxin  as needed - Discussed importance of limiting Tylenol  as it can be liver toxic - Offered referral to pain management if the patient would like to restart oxycodone .  Patient declined referral.    GERD - Continue Protonix     Patient declined any lab work today.   Return in about 4 weeks (around 01/26/2025).   Oneil LELON Severin, FNP [facility]     "

## 2024-12-29 NOTE — Telephone Encounter (Signed)
 Spoke with Dana from [facility] and gave her verbal approval for skilled nursing eval, PT, and OT, as requested.

## 2024-12-30 ENCOUNTER — Telehealth: Payer: Self-pay

## 2024-12-30 ENCOUNTER — Telehealth: Payer: Self-pay | Admitting: Family Medicine

## 2024-12-30 NOTE — Telephone Encounter (Signed)
 FYI for PCP to be made aware of- no action needed.    Copied from CRM #[id number]. Topic: General - Other >> Dec 30, 2024  5:14 PM Sophia H wrote: Reason for CRM:  Just FYI for provider ** Wanted to inform provider that patient started his [facility] care today. Patient has a limited amount of visits due to insurance coverage - 12 visits maximum. On speaking with the patient he also wants to work on mobility.   Nursing will only need 1 more visit, rest of visits will be used for PT & OT.

## 2024-12-30 NOTE — Transitions of Care (Post Inpatient/ED Visit) (Signed)
" ° °  12/30/2024  Name: Taylor Obrien MRN: [id number] DOB: 10-Aug-1966  Today's TOC FU Call Status: Unsuccessful Call (1st Attempt) Date: 12/30/24  Attempted to reach the patient regarding the most recent Inpatient/ED visit.  Unable to reach patient by phone or leave a message.  Follow Up Plan: Additional outreach attempts will be made to reach the patient to complete the Transitions of Care (Post Inpatient/ED visit) call.   Richerd Fish, RN, BSN, CCM [facility], [facility] RN Care Manager Direct Dial: [phone]        "

## 2024-12-30 NOTE — Telephone Encounter (Signed)
 This RN contacted [facility]. They stated they need new orders for all of the patients prescribed medications.   Also requesting a med list be faxed to them at: [phone]  Copied from CRM #[id number]. Topic: General - Other >> Dec 30, 2024  3:41 PM Tonda B wrote: Reason for CRM: [facility] is calling saying that they need a med list and new rx sent in please call [phone]

## 2024-12-30 NOTE — Telephone Encounter (Signed)
 [facility] in [place] is requesting that we send in new Rx's for patient along with a current medication list. I can send the med list but either need your approval to send new Rx's or have you send.

## 2024-12-31 ENCOUNTER — Telehealth: Payer: Self-pay | Admitting: Family Medicine

## 2024-12-31 ENCOUNTER — Telehealth (HOSPITAL_COMMUNITY): Payer: Self-pay | Admitting: Cardiology

## 2024-12-31 ENCOUNTER — Telehealth: Payer: Self-pay

## 2024-12-31 ENCOUNTER — Other Ambulatory Visit: Payer: Self-pay | Admitting: Family Medicine

## 2024-12-31 DIAGNOSIS — M549 Dorsalgia, unspecified: Secondary | ICD-10-CM

## 2024-12-31 DIAGNOSIS — I63031 Cerebral infarction due to thrombosis of right carotid artery: Secondary | ICD-10-CM

## 2024-12-31 DIAGNOSIS — I110 Hypertensive heart disease with heart failure: Secondary | ICD-10-CM

## 2024-12-31 MED ORDER — POTASSIUM CHLORIDE ER 10 MEQ PO TBCR: 10.0000 meq | EXTENDED_RELEASE_TABLET | Freq: Every day | ORAL | 0 refills | Status: AC

## 2024-12-31 MED ORDER — VASCEPA 1 G PO CAPS: 2.0000 g | ORAL_CAPSULE | Freq: Two times a day (BID) | ORAL | 1 refills | Status: AC

## 2024-12-31 MED ORDER — TORSEMIDE 20 MG PO TABS
40.0000 mg | ORAL_TABLET | Freq: Two times a day (BID) | ORAL | 0 refills | Status: AC
  Filled 2025-01-03: qty 60, 30d supply, fill #0

## 2024-12-31 MED ORDER — METHOCARBAMOL 500 MG PO TABS: 500.0000 mg | ORAL_TABLET | Freq: Three times a day (TID) | ORAL | 2 refills | Status: AC | PRN

## 2024-12-31 MED ORDER — CLOPIDOGREL BISULFATE 75 MG PO TABS: 75.0000 mg | ORAL_TABLET | Freq: Every day | ORAL | 0 refills | Status: AC

## 2024-12-31 NOTE — Telephone Encounter (Signed)
 I faxed patients current med list to [facility] as requested and noted on fax cover sheet that Rx's for all the meds that PCP prescribed has also been sent to them as requested.

## 2024-12-31 NOTE — Telephone Encounter (Signed)
 Copied from CRM #[id number]. Topic: Clinical - Prescription Issue >> Dec 31, 2024  3:04 PM Marda MATSU wrote: Reason for CRM: Robin with [facility] calling stated she did receive the list but there are missing medications. Tried to connect to office but was told they are short staffed and to fax a CRM.Giles)   Grayce is asking for a call back today. Medications are necessary meds.   Please advise

## 2024-12-31 NOTE — Telephone Encounter (Unsigned)
 Copied from CRM [id number]. Topic: Clinical - Home Health Verbal Orders >> Dec 31, 2024  3:53 PM Everette C wrote: Caller/Agency: [facility] Number: [phone] Service Requested: Occupational Therapy Frequency: 1w3 Any new concerns about the patient? No

## 2024-12-31 NOTE — Telephone Encounter (Unsigned)
 Copied from CRM [id number]. Topic: Clinical - Home Health Verbal Orders >> Dec 31, 2024 11:56 AM Avram MATSU wrote: Caller/Agency: [facility] Number: [phone] Service Requested: Physical Therapy Frequency: 1*1 evaluation  Any new concerns about the patient? No

## 2024-12-31 NOTE — Telephone Encounter (Signed)
 Verbal given.

## 2024-12-31 NOTE — Telephone Encounter (Signed)
 Spoke with Grayce from [facility] and she stated that she spoke with patients daughter who said that she was moving and that someone else in the family would be taking care of patient. Says she also said that patients PCP, Oneil, would be patients only prescribing doctor. Daughter has requested pill packs for patient but Grayce needs PCP to send over new Rx's on the medications initially prescribed by the hospital in order to put all of his medications in pill packs and not have any confusion as to the prescriber. Needs PCP to review these medications and determine if he will send new Rx on them or take patient off of the medicine.  Atorvastatin  80mg  Aspirin  81mg  Lexapro  20mg  Bidil  20-37.5mg  Imodium  2mg  Oxycodone  5mg  Pantoprazole  40mg  Tylenol  325 mg Calcitriol  0.84mcg Carvedilol  25mg  Melatonin 5mg  Lidocaine  patches

## 2024-12-31 NOTE — Telephone Encounter (Signed)
 Spoke with Taylor Obrien and have him verbal approval for OT with a frequency of 1x per week for 3 weeks.

## 2024-12-31 NOTE — Telephone Encounter (Signed)
 I spoke with PCP regarding previous message and PCP confirmed that he sent in all of the Rx's that he will be in charge of prescribing.  Also called patients daughter and explained to her that Oneil can't be patients only prescribing doctor because patient has a lot of health issues that he already sees specialists for like for his heart and kidneys and needs to keep seeing those providers who will also be in charge of prescribing medicine to patient. Daughter was unaware. Said she would call [facility] and clear this up with them.

## 2024-12-31 NOTE — Transitions of Care (Post Inpatient/ED Visit) (Signed)
" ° °  12/31/2024  Name: Taylor Obrien MRN: [id number] DOB: 1965-12-17  Today's TOC FU Call Status: Today's TOC FU Call Status:: Successful TOC FU Call Completed TOC FU Call Complete Date: 12/31/24  Attempted to reach the patient regarding the most recent Inpatient/ED visit.  Spoke with daughter and patient. Patient request a call back I haven't gotten up yet  Follow Up Plan: Additional outreach attempts will be made to reach the patient to complete the Transitions of Care (Post Inpatient/ED visit) call.   Richerd Fish, RN, BSN, CCM [facility], [facility] RN Care Manager Direct Dial: [phone]        "

## 2025-01-03 ENCOUNTER — Other Ambulatory Visit (HOSPITAL_BASED_OUTPATIENT_CLINIC_OR_DEPARTMENT_OTHER): Payer: Self-pay

## 2025-01-03 ENCOUNTER — Other Ambulatory Visit (HOSPITAL_COMMUNITY): Payer: Self-pay

## 2025-01-03 ENCOUNTER — Other Ambulatory Visit: Payer: Self-pay | Admitting: *Deleted

## 2025-01-03 ENCOUNTER — Other Ambulatory Visit: Payer: Self-pay

## 2025-01-03 ENCOUNTER — Ambulatory Visit (HOSPITAL_COMMUNITY)

## 2025-01-03 MED ORDER — POTASSIUM CHLORIDE CRYS ER 10 MEQ PO TBCR
10.0000 meq | EXTENDED_RELEASE_TABLET | Freq: Every day | ORAL | 2 refills | Status: DC
  Filled 2025-01-03: qty 30, 30d supply, fill #0

## 2025-01-03 MED ORDER — CEPHALEXIN 250 MG PO CAPS: 250.0000 mg | ORAL_CAPSULE | Freq: Three times a day (TID) | ORAL | 0 refills | Status: AC

## 2025-01-03 MED ORDER — LOSARTAN POTASSIUM 50 MG PO TABS
50.0000 mg | ORAL_TABLET | Freq: Every day | ORAL | 0 refills | Status: AC
  Filled 2025-01-03: qty 30, 30d supply, fill #0

## 2025-01-03 MED ORDER — ESCITALOPRAM OXALATE 20 MG PO TABS
20.0000 mg | ORAL_TABLET | Freq: Every day | ORAL | 11 refills | Status: DC
  Filled 2025-01-03: qty 30, 30d supply, fill #0

## 2025-01-03 MED ORDER — ACCU-CHEK GUIDE W/DEVICE KIT
PACK | 0 refills | Status: AC
  Filled 2025-01-03: qty 1, 30d supply, fill #0

## 2025-01-03 MED ORDER — LANCETS MISC
1 refills | Status: AC
  Filled 2025-01-03: qty 100, 90d supply, fill #0

## 2025-01-03 MED ORDER — TORSEMIDE 20 MG PO TABS: 20.0000 mg | ORAL_TABLET | Freq: Two times a day (BID) | ORAL | 2 refills | Status: DC

## 2025-01-03 MED ORDER — GLUCOSE BLOOD VI STRP
ORAL_STRIP | 1 refills | Status: AC
  Filled 2025-01-03: qty 50, 50d supply, fill #0

## 2025-01-04 ENCOUNTER — Telehealth: Payer: Self-pay | Admitting: Cardiology

## 2025-01-04 ENCOUNTER — Other Ambulatory Visit (HOSPITAL_COMMUNITY): Payer: Self-pay

## 2025-01-04 MED ORDER — LIDOCAINE 5 % EX PTCH: 2.0000 | MEDICATED_PATCH | CUTANEOUS | 0 refills | Status: AC

## 2025-01-04 MED ORDER — ATORVASTATIN CALCIUM 80 MG PO TABS: 80.0000 mg | ORAL_TABLET | Freq: Every day | ORAL | 0 refills | Status: AC

## 2025-01-04 MED ORDER — ISOSORB DINITRATE-HYDRALAZINE 20-37.5 MG PO TABS: 2.0000 | ORAL_TABLET | Freq: Three times a day (TID) | ORAL | 2 refills | Status: AC

## 2025-01-04 MED ORDER — ESCITALOPRAM OXALATE 20 MG PO TABS: 20.0000 mg | ORAL_TABLET | Freq: Every day | ORAL | 0 refills | Status: AC

## 2025-01-04 NOTE — Telephone Encounter (Signed)
 Refill was sent 12/12/24.

## 2025-01-04 NOTE — Telephone Encounter (Signed)
" °*  STAT* If patient is at the pharmacy, call can be transferred to refill team.   1. Which medications need to be refilled? (please list name of each medication and dose if known)   carvedilol  (COREG ) 25 MG tablet  calcitRIOL  (ROCALTROL ) 0.25 MCG capsule  aspirin  EC 81 MG tablet  2. Which pharmacy/location (including street and city if local pharmacy) is medication to be sent to? [address] - [place], [place] - [address] Phone: [phone]  Fax: [phone]      3. Do they need a 30 day or 90 day supply? 90 Per pharmacy pt is out of medication "

## 2025-01-05 ENCOUNTER — Other Ambulatory Visit: Payer: Self-pay

## 2025-01-05 ENCOUNTER — Emergency Department (HOSPITAL_COMMUNITY)
Admission: EM | Admit: 2025-01-05 | Discharge: 2025-01-05 | Disposition: A | Discharge status: Home / Self Care | Attending: Emergency Medicine | Admitting: Emergency Medicine

## 2025-01-05 ENCOUNTER — Telehealth (HOSPITAL_COMMUNITY): Payer: Self-pay

## 2025-01-05 ENCOUNTER — Ambulatory Visit: Payer: Self-pay

## 2025-01-05 ENCOUNTER — Telehealth: Payer: Self-pay | Admitting: Family Medicine

## 2025-01-05 ENCOUNTER — Ambulatory Visit: Payer: Self-pay | Admitting: Nurse Practitioner

## 2025-01-05 ENCOUNTER — Encounter (HOSPITAL_COMMUNITY): Payer: Self-pay

## 2025-01-05 ENCOUNTER — Other Ambulatory Visit (HOSPITAL_COMMUNITY): Payer: Self-pay

## 2025-01-05 DIAGNOSIS — K5641 Fecal impaction: Secondary | ICD-10-CM | POA: Diagnosis not present

## 2025-01-05 DIAGNOSIS — K6289 Other specified diseases of anus and rectum: Secondary | ICD-10-CM | POA: Diagnosis present

## 2025-01-05 DIAGNOSIS — Z7982 Long term (current) use of aspirin: Secondary | ICD-10-CM | POA: Insufficient documentation

## 2025-01-05 NOTE — Telephone Encounter (Signed)
 I called and spoke with patients daughter and she confirmed that this appt is regarding patients high BP; not rectal pain. Says patient has his BP medicine to regulate his BP but is noncompliant with taking it. Daughter says patient is scheduled to see [facility] tomorrow and is hoping patient doesn't refuse to go. Requested to cancel appt in [place] for today. Advised daughter when patient should see ED for hypertension. She voiced understanding and says she is going to try and talk patient in to taking his BP medication.

## 2025-01-05 NOTE — ED Triage Notes (Signed)
 Pt reports my butts hanging out my butt x 4days.  Pt states his PT person told him to come to the ER.

## 2025-01-05 NOTE — Telephone Encounter (Signed)
 Called to confirm/remind patient of their appointment at [facility] on 01/06/25.   Appointment:   [] Confirmed  [] Left mess   [x] No answer/No voice mail  [] VM Full/unable to leave message  [] Phone not in service

## 2025-01-05 NOTE — Telephone Encounter (Signed)
 Copied from CRM #[id number]. Topic: General - Other >> Jan 05, 2025  1:31 PM Avram MATSU wrote: Reason for CRM: [place] from [facility] wanted to advise the provider, patient had evaluated BP, Medford already spoke with NT. Patient PT evaluation will be moved next week and with call.

## 2025-01-05 NOTE — Telephone Encounter (Addendum)
 FYI Only or Action Required?: FYI only for provider: appointment scheduled on 1/28.  Patient was last seen in primary care on 12/29/2024 by Taylor Obrien ORN, FNP.  Called Nurse Triage reporting Hypertension and Rectal Pain.  Symptoms began today.  Interventions attempted: Prescription medications: refusing BP med and Rest, hydration, or home remedies.  Symptoms are: gradually worsening.  Triage Disposition: See HCP Within 4 Hours (Or PCP Triage)  Patient/caregiver understands and will follow disposition?: Yes      This RN also advised family/Taylor Obrien try to convince Taylor Obrien to take BP med asap.  Reason for Disposition  SEVERE rectal pain (e.g., excruciating, unable to have a bowel movement)  [1] Systolic BP >= 200 OR Diastolic >= 120 AND [2] having NO cardiac or neurologic symptoms  Answer Assessment - Initial Assessment Questions This RN recommended Taylor Obrien be examined in next 4 hours, scheduled with family for appt today with other office in region. Advised call back or seek immediate care in ED if new or worsening symptoms.     Speaking with [place] [facility] Taylor Obrien, Taylor Obrien and family in background  1. SYMPTOM:  What's the main symptom you're concerned about? (e.g., pain, itching, swelling, rash)     10/10 rectal pain - mostly subjective reports of pain, not as much physical signs of pain per Taylor Obrien Taylor Obrien calling out in pain in background as family trying to maneuver Taylor Obrien to examine rectum No blood, little bit feces, but no prolapse High BP readings 190s/110s-120s Other than general discomfort, seems normal  5. CONSTIPATION: Do you have constipation? If Yes, ask: How often do you have a bowel movement (BM)?  (Normal range: 3 times a day to every 3 days)  When was your last BM?       Typically has BM every 3 days or so been about 5 days, no stool softeners in his med list  6. CAUSE: What do you think is causing the anus symptoms?     Constipation  7. OTHER SYMPTOMS: Do you have any  other symptoms?  (e.g., abdomen pain, fever, rectal bleeding, vomiting)     High BP readings - Taylor Obrien missed his BP med and is refusing to take it, 196/111 waited then 196/122 Other than   Taylor Obrien denies and Taylor Obrien sees no evidence of: Confusion/slurred speech Weakness/numbness Chest pain SOB Dizziness Heart racing Blurred vision Headache Rectal prolapse Rectal injury Worse swelling to legs than usual  Protocols used: Blood Pressure - High-A-AH, Rectal Symptoms-A-AH

## 2025-01-05 NOTE — Discharge Instructions (Signed)
 Drink plenty of fluids and take a laxative.  You may also need to take a stool softener.  Follow-up with your primary care doctor.  Return to the emergency department if any worsening or concerning symptoms.

## 2025-01-05 NOTE — ED Provider Notes (Signed)
 " [place] EMERGENCY DEPARTMENT AT [facility] Provider Note   CSN: [id number] Arrival date & time: 01/05/25  1649     Patient presents with: rectal prolapse   Taylor Obrien is a 59 y.o. male.  Complaining of severe rectal pain going on for few days.  He said there is a large mass there.  He talked to his physical therapist who recommended he come to the emergency department.  He has not had a bowel movement in 4 to 5 days.  This is a new problem for him.  He has a prior history of kidney disease and also has had a prior stroke.  {Add pertinent medical, surgical, social history, OB history to YEP:67052} The history is provided by the patient.       Prior to Admission medications  Medication Sig Start Date End Date Taking? Authorizing Provider  acetaminophen  (TYLENOL ) 325 MG tablet Take 2 tablets (650 mg total) by mouth every 6 (six) hours as needed for mild pain (pain score 1-3) or fever (or Fever >/= 101). 12/03/24   Pearlean Manus, MD  aspirin  EC 81 MG tablet Take 1 tablet (81 mg total) by mouth daily with breakfast. 12/03/24 12/03/25  Pearlean Manus, MD  atorvastatin  (LIPITOR ) 80 MG tablet Take 1 tablet (80 mg total) by mouth daily. 01/04/25   [name] ORN, FNP  Blood Glucose Monitoring Suppl (ACCU-CHEK GUIDE) w/Device KIT Use as directed 05/18/24     calcitRIOL  (ROCALTROL ) 0.25 MCG capsule Take 1 capsule (0.25 mcg total) by mouth every Monday, Wednesday, and Friday. 11/08/24 02/06/25  Pokhrel, Laxman, MD  cephALEXin  (KEFLEX ) 250 MG capsule Take 1 capsule (250 mg total) by mouth 3 (three) times daily. 12/03/24   Pearlean Manus, MD  clopidogrel  (PLAVIX ) 75 MG tablet Take 1 tablet (75 mg total) by mouth daily. 12/31/24   [name] ORN, FNP  escitalopram  (LEXAPRO ) 20 MG tablet Take 1 tablet (20 mg total) by mouth daily. 01/04/25   [name] ORN, FNP  fenofibrate  (TRICOR ) 145 MG tablet Take 145 mg by mouth daily. 04/10/22   [provider]  glucose blood test strip  Use daily as directed 05/28/24     isosorbide -hydrALAZINE  (BIDIL ) 20-37.5 MG tablet Take 2 tablets by mouth 3 (three) times daily. 01/04/25   [name] ORN, FNP  Lancets MISC Use as directed 05/18/24     lidocaine  (LIDODERM ) 5 % Place 2 patches onto the skin daily. Remove & Discard patch within 12 hours or as directed by MD 01/04/25   [name] ORN, FNP  loperamide  (IMODIUM ) 2 MG capsule Take 1 capsule (2 mg total) by mouth as needed for diarrhea or loose stools (Loose stools/Diarrhea). 12/03/24   Pearlean Manus, MD  losartan  (COZAAR ) 50 MG tablet Take 1 tablet (50 mg total) by mouth daily. 12/12/24   Pearlean Manus, MD  melatonin 5 MG TABS Take 5 mg by mouth at bedtime.    [provider]  methocarbamol  (ROBAXIN ) 500 MG tablet Take 1 tablet (500 mg total) by mouth every 8 (eight) hours as needed. 12/31/24   [name] ORN, FNP  oxyCODONE  (OXY IR/ROXICODONE ) 5 MG immediate release tablet Take 1 tablet (5 mg total) by mouth every 6 (six) hours as needed for severe pain (pain score 7-10) or breakthrough pain. 12/28/24   Krishnan, Gokul, MD  pantoprazole  (PROTONIX ) 40 MG tablet Take 1 tablet (40 mg total) by mouth daily. 12/03/24   Pearlean Manus, MD  potassium chloride  (KLOR-CON  M) 10 MEQ tablet  Take 1 tablet (10 mEq total) by mouth daily. Take with torsemide  12/03/24   Pearlean Manus, MD  potassium chloride  (KLOR-CON ) 10 MEQ tablet Take 1 tablet (10 mEq total) by mouth daily. 12/31/24   [name] ORN, FNP  torsemide  (DEMADEX ) 20 MG tablet Take 1 tablet (20 mg total) by mouth 2 (two) times daily. 12/12/24   [name] ORN, FNP  torsemide  (DEMADEX ) 20 MG tablet Take 1 tablet (20 mg total) by mouth 2 (two) times daily. 12/03/24   Pearlean Manus, MD  VASCEPA  1 g capsule Take 2 capsules (2 g total) by mouth 2 (two) times daily. 12/31/24   [name] ORN, FNP  carvedilol  (COREG ) 25 MG tablet Take 1 tablet (25 mg total) by mouth 2 (two) times daily with a meal. 12/12/24 01/05/25  Bryn Bernardino NOVAK,  MD    Allergies: Ibuprofen  and Oxcarbazepine    Review of Systems  Updated Vital Signs BP (!) 217/121 (BP Location: Right Arm)   Pulse 83   Temp 97.8 F (36.6 C)   Resp 16   Wt 68.1 kg   SpO2 98%   BMI 24.23 kg/m   Physical Exam Vitals and nursing note reviewed.  Constitutional:      Appearance: Normal appearance. He is well-developed.  HENT:     Head: Normocephalic and atraumatic.  Eyes:     Conjunctiva/sclera: Conjunctivae normal.  Cardiovascular:     Rate and Rhythm: Normal rate and regular rhythm.     Heart sounds: No murmur heard. Pulmonary:     Effort: Pulmonary effort is normal. No respiratory distress.     Breath sounds: Normal breath sounds.  Abdominal:     Palpations: Abdomen is soft.     Tenderness: There is no abdominal tenderness.  Genitourinary:    Comments: Patient has no evidence of rectal prolapse.  There is a lot of hard stool inside the rectal vault.  This was manually disimpacted.  There was no gross bleeding. Musculoskeletal:     Cervical back: Neck supple.  Skin:    General: Skin is warm and dry.  Neurological:     Mental Status: He is alert.     GCS: GCS eye subscore is 4. GCS verbal subscore is 5. GCS motor subscore is 6.     (all labs ordered are listed, but only abnormal results are displayed) Labs Reviewed - No data to display  EKG: None  Radiology: No results found.  {Document cardiac monitor, telemetry assessment procedure when appropriate:32947} Procedures   Medications Ordered in the ED - No data to display    {Click here for ABCD2, HEART and other calculators REFRESH Note before signing:1}                              Medical Decision Making  This patient complains of ***; this involves an extensive number of treatment Options and is a complaint that carries with it a high risk of complications and morbidity. The differential includes ***  I ordered, reviewed and interpreted labs, which included *** I ordered  medication *** and reviewed PMP when indicated. I ordered imaging studies which included *** and I independently    visualized and interpreted imaging which showed *** Additional history obtained from *** Previous records obtained and reviewed *** I consulted *** and discussed lab and imaging findings and discussed disposition.  Cardiac monitoring reviewed, *** Social determinants considered, *** Critical Interventions: ***  After the interventions  stated above, I reevaluated the patient and found *** Admission and further testing considered, ***   {Document critical care time when appropriate  Document review of labs and clinical decision tools ie CHADS2VASC2, etc  Document your independent review of radiology images and any outside records  Document your discussion with family members, caretakers and with consultants  Document social determinants of health affecting pt's care  Document your decision making why or why not admission, treatments were needed:32947:::1}   Final diagnoses:  None    ED Discharge Orders     None        "

## 2025-01-06 ENCOUNTER — Other Ambulatory Visit (HOSPITAL_COMMUNITY): Payer: Self-pay

## 2025-01-06 ENCOUNTER — Ambulatory Visit (HOSPITAL_COMMUNITY)
Admission: RE | Admit: 2025-01-06 | Discharge: 2025-01-06 | Disposition: A | Discharge status: Home / Self Care | Source: Ambulatory Visit | Attending: Cardiology | Admitting: Cardiology

## 2025-01-06 ENCOUNTER — Encounter (HOSPITAL_COMMUNITY): Payer: Self-pay

## 2025-01-06 VITALS — BP 173/108 | HR 113 | Ht 66.0 in | Wt 154.0 lb

## 2025-01-06 DIAGNOSIS — G4733 Obstructive sleep apnea (adult) (pediatric): Secondary | ICD-10-CM | POA: Insufficient documentation

## 2025-01-06 DIAGNOSIS — I69354 Hemiplegia and hemiparesis following cerebral infarction affecting left non-dominant side: Secondary | ICD-10-CM | POA: Insufficient documentation

## 2025-01-06 DIAGNOSIS — F17210 Nicotine dependence, cigarettes, uncomplicated: Secondary | ICD-10-CM | POA: Insufficient documentation

## 2025-01-06 DIAGNOSIS — I5022 Chronic systolic (congestive) heart failure: Secondary | ICD-10-CM | POA: Diagnosis not present

## 2025-01-06 DIAGNOSIS — I130 Hypertensive heart and chronic kidney disease with heart failure and stage 1 through stage 4 chronic kidney disease, or unspecified chronic kidney disease: Secondary | ICD-10-CM | POA: Diagnosis not present

## 2025-01-06 DIAGNOSIS — T50916A Underdosing of multiple unspecified drugs, medicaments and biological substances, initial encounter: Secondary | ICD-10-CM | POA: Diagnosis not present

## 2025-01-06 DIAGNOSIS — E1122 Type 2 diabetes mellitus with diabetic chronic kidney disease: Secondary | ICD-10-CM | POA: Insufficient documentation

## 2025-01-06 DIAGNOSIS — I10 Essential (primary) hypertension: Secondary | ICD-10-CM

## 2025-01-06 DIAGNOSIS — Z91128 Patient's intentional underdosing of medication regimen for other reason: Secondary | ICD-10-CM | POA: Diagnosis not present

## 2025-01-06 DIAGNOSIS — Z5941 Food insecurity: Secondary | ICD-10-CM | POA: Diagnosis not present

## 2025-01-06 DIAGNOSIS — Z5982 Transportation insecurity: Secondary | ICD-10-CM | POA: Insufficient documentation

## 2025-01-06 DIAGNOSIS — Z993 Dependence on wheelchair: Secondary | ICD-10-CM | POA: Insufficient documentation

## 2025-01-06 DIAGNOSIS — N184 Chronic kidney disease, stage 4 (severe): Secondary | ICD-10-CM | POA: Insufficient documentation

## 2025-01-06 DIAGNOSIS — I5020 Unspecified systolic (congestive) heart failure: Secondary | ICD-10-CM | POA: Diagnosis present

## 2025-01-06 NOTE — Patient Instructions (Signed)
 Thank you for your visit today.   There has been no changes to any medications.  Please follow up with your General Cardiologist as scheduled.  At the [facility], you and your health needs are our priority. As part of our continuing mission to provide you with exceptional heart care, we have created designated Provider Care Teams. These Care Teams include your primary Cardiologist (physician) and Advanced Practice Providers (APPs- Physician Assistants and Nurse Practitioners) who all work together to provide you with the care you need, when you need it.   You may see any of the following providers on your designated Care Team at your next follow up: Dr Toribio Fuel Dr Ezra Shuck Dr. Morene Brownie Greig Mosses, NP Caffie Shed, [place] [facility], [place] Beckey Coe, NP Jordan Lee, NP Ellouise Class, NP Tinnie Redman, PharmD Jaun Bash, PharmD   Please be sure to bring in all your medications bottles to every appointment.    Thank you for choosing [facility]

## 2025-01-06 NOTE — Progress Notes (Signed)
 "    HEART & VASCULAR TRANSITION OF CARE CONSULT NOTE     Referring Physician: [name] ORN, FNP   Chief Complaint: Systolic Heart Failure   HPI: Referred to clinic by Dr. Krishan for heart failure consultation.   Taylor Obrien is a 59 y.o. 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   Diagnosed w/ systolic heart failure in 12/25. Echo then showed EF 30-35%. LHC not perused d/t CKD. This was also felt to be most likely d/t poorly controlled HTN. HF was managed medically.    Recently readmitted 1/26 for a/c CHF w/ pulmonary edema in the setting of hypertensive crisis. Poor compliance w/ meds. Echo was repeated EF improved 45-50%, RV normal. Diuresed w/ IV Lasix . HF GDMT limited by renal fx. Placed on Coreg  and Bidil  but he got hypotensive and coreg  was discontinued and Bidil  dose reduced. Internal medicine did place on Losartan . Also placed on torsemide  20 mg bid.   He presents to [facility] clinic today for post hospital f/u. Here w/ 2 of his daughters. BP markedly elevated 173/103. Not taking his meds. His daughter reports she fills is bill box weekly but he refuses to take meds. Still smoking cigarettes. Smoked for 30 + years. Smokes 1ppd. He denies any current HA or chest pain. No resting dyspnea but has mild dyspnea w/ WC transfers, dressing and bathing, this is chronic. No LEE.   He has consultation w/ nephrology next wk.    Cardiac Testing  Echo 1/26  1. Mid/basal inferior wall hypokinesis . Left ventricular ejection  fraction, by estimation, is 45 to 50%. The left ventricle has mildly  decreased function. The left ventricle demonstrates regional wall motion  abnormalities (see scoring diagram/findings  for description). The left ventricular internal cavity size was mildly  dilated. There is mild left ventricular hypertrophy.  Left ventricular  diastolic parameters are consistent with Grade I diastolic dysfunction  (impaired relaxation).   2. Right ventricular systolic function is normal. The right ventricular  size is normal.   3. The mitral valve is abnormal. Trivial mitral valve regurgitation. No  evidence of mitral stenosis.   4. The aortic valve is tricuspid. There is mild calcification of the  aortic valve. Aortic valve regurgitation is not visualized. Aortic valve  sclerosis is present, with no evidence of aortic valve stenosis.   5. The inferior vena cava is normal in size with greater than 50%  respiratory variability, suggesting right atrial pressure of 3 mmHg.   Past Medical History:  Diagnosis Date   Acute alcoholic pancreatitis    Arthritis    CHF (congestive heart failure) (HCC)    Chronic pain    Depression    Diabetes mellitus without complication (HCC)    Essential hypertension, benign    GERD (gastroesophageal reflux disease)    Headache    HTN (hypertension) 11/27/2015   Hyperlipidemia    Peripheral vascular disease    Sleep apnea    could not tolerate CPAP   Stroke (HCC)    2016    Current Outpatient Medications  Medication Sig Dispense Refill   acetaminophen  (TYLENOL ) 325 MG tablet Take 2 tablets (650 mg total) by mouth every 6 (six) hours as needed for mild pain (pain score 1-3) or fever (or Fever >/= 101).     aspirin  EC 81 MG tablet Take 1 tablet (81  mg total) by mouth daily with breakfast. 30 tablet 2   atorvastatin  (LIPITOR ) 80 MG tablet Take 1 tablet (80 mg total) by mouth daily. 90 tablet 0   Blood Glucose Monitoring Suppl (ACCU-CHEK GUIDE) w/Device KIT Use as directed 1 kit 0   calcitRIOL  (ROCALTROL ) 0.25 MCG capsule Take 1 capsule (0.25 mcg total) by mouth every Monday, Wednesday, and Friday. 36 capsule 0   cephALEXin  (KEFLEX ) 250 MG capsule Take 1 capsule (250 mg total) by mouth 2 (two) times daily for 3 days. 6 capsule 0   clopidogrel  (PLAVIX ) 75 MG tablet Take 1  tablet (75 mg total) by mouth daily. 90 tablet 0   escitalopram  (LEXAPRO ) 20 MG tablet Take 1 tablet (20 mg total) by mouth daily. 90 tablet 0   fenofibrate  (TRICOR ) 145 MG tablet Take 145 mg by mouth daily.     glucose blood test strip Use daily as directed 100 each 1   isosorbide -hydrALAZINE  (BIDIL ) 20-37.5 MG tablet Take 2 tablets by mouth 3 (three) times daily. 180 tablet 2   Lancets MISC Use as directed 100 each 1   lidocaine  (LIDODERM ) 5 % Place 2 patches onto the skin daily. Remove & Discard patch within 12 hours or as directed by MD 30 patch 0   loperamide  (IMODIUM ) 2 MG capsule Take 1 capsule (2 mg total) by mouth as needed for diarrhea or loose stools (Loose stools/Diarrhea). 30 capsule 3   losartan  (COZAAR ) 50 MG tablet Take 1 tablet (50 mg total) by mouth daily. 30 tablet 0   melatonin 5 MG TABS Take 5 mg by mouth at bedtime.     methocarbamol  (ROBAXIN ) 500 MG tablet Take 1 tablet (500 mg total) by mouth every 8 (eight) hours as needed. 90 tablet 2   oxyCODONE  (OXY IR/ROXICODONE ) 5 MG immediate release tablet Take 1 tablet (5 mg total) by mouth every 6 (six) hours as needed for severe pain (pain score 7-10) or breakthrough pain. 15 tablet 0   pantoprazole  (PROTONIX ) 40 MG tablet Take 1 tablet (40 mg total) by mouth daily. 30 tablet 4   potassium chloride  (KLOR-CON ) 10 MEQ tablet Take 1 tablet (10 mEq total) by mouth daily. 90 tablet 0   torsemide  (DEMADEX ) 20 MG tablet Take 1 tablet (20 mg total) by mouth 2 (two) times daily. 60 tablet 0   VASCEPA  1 g capsule Take 2 capsules (2 g total) by mouth 2 (two) times daily. 120 capsule 1   No current facility-administered medications for this encounter.    Allergies[1]    Social History   Socioeconomic History   Marital status: Married    Spouse name: Not on file   Number of children: Not on file   Years of education: Not on file   Highest education level: Not on file  Occupational History   Not on file  Tobacco Use   Smoking  status: Every Day    Current packs/day: 0.50    Average packs/day: 0.5 packs/day for 30.0 years (15.0 ttl pk-yrs)    Types: Cigarettes   Smokeless tobacco: Never  Vaping Use   Vaping status: Never Used  Substance and Sexual Activity   Alcohol use: Not Currently    Comment: 2 40 oz daily. 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 Stopped liquor 5 years ago.states no etoh since 03/2022   Drug use: Yes    Types: Marijuana    Comment: occassionally   Sexual activity: Yes    Birth control/protection: None  Other Topics Concern  Not on file  Social History Narrative   Not on file   Social Drivers of Health   Tobacco Use: High Risk (01/06/2025)   Patient History    Smoking Tobacco Use: Every Day    Smokeless Tobacco Use: Never    Passive Exposure: Not on file  Financial Resource Strain: Not on file  Food Insecurity: Food Insecurity Present (12/25/2024)   Epic    Worried About Programme Researcher, Broadcasting/film/video in the Last Year: Often true    Barista in the Last Year: Often true  Transportation Needs: Unmet Transportation Needs (12/25/2024)   Epic    Lack of Transportation (Medical): Yes    Lack of Transportation (Non-Medical): Yes  Physical Activity: Not on file  Stress: Not on file  Social Connections: Unknown (12/10/2024)   Social Connection and Isolation Panel    Frequency of Communication with Friends and Family: Three times a week    Frequency of Social Gatherings with Friends and Family: Once a week    Attends Religious Services: 1 to 4 times per year    Active Member of Clubs or Organizations: No    Attends Banker Meetings: 1 to 4 times per year    Marital Status: Patient declined  Intimate Partner Violence: Not At Risk (12/25/2024)   Epic    Fear of Current or Ex-Partner: No    Emotionally Abused: No    Physically Abused: No    Sexually Abused: No  Depression (PHQ2-9): Medium Risk (12/29/2024)   Depression (PHQ2-9)    PHQ-2 Score: 9  Alcohol Screen: High Risk  (04/21/2024)   Alcohol Screen    Last Alcohol Screening Score (AUDIT): 29  Housing: High Risk (12/25/2024)   Epic    Unable to Pay for Housing in the Last Year: Yes    Number of Times Moved in the Last Year: 0    Homeless in the Last Year: No  Utilities: Not At Risk (12/25/2024)   Epic    Threatened with loss of utilities: No  Recent Concern: Utilities - At Risk (12/10/2024)   Epic    Threatened with loss of utilities: Yes  Health Literacy: Not on file      Family History  Problem Relation Age of Onset   Diabetes Mother    Hypertension Mother    Drug abuse Mother    Hyperlipidemia Mother    Diabetes Father    Hypertension Father    Hyperlipidemia Father    Diabetes Brother    Hypertension Brother     Vitals:   01/06/25 1415  BP: (!) 173/108  Pulse: (!) 113  SpO2: 99%  Weight: 69.9 kg (154 lb)  Height: 5' 6 (1.676 m)    PHYSICAL EXAM: General:  chronically ill appearing, in WC  HEENT: normal Neck: JVD not elevated.  Cor: Regular rhythm, tachy rate. No rubs, gallops or murmurs. Lungs: clear Abdomen: soft, nontender, nondistended.  Extremities: no cyanosis, clubbing, rash, edema Neuro: alert & oriented x 3. Affect pleasant. + left sided weaknesses (residual from CVA)  ECG: sinus tach 117 bpm, LVH, personally reviewed    ASSESSMENT & PLAN:  1. 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 Mainly WC dependent following CVA. Has chronic stable NYHA Class II symptoms w/ transfers - Grossly euvolemic on exam but hypertensive in setting of med nonappearance. I  discussed the nature of HF and his high risk of recurrent flash pulmonary edema/need for hospitalization if BP stays poorly controlled, risk of worsening HF.  - I strongly encouraged him to get back on meds. Daughter will continue to fill his pill box - I will not adjust med doses today. Will  need to see how his BP responds on prescribed regimen prior to making any adjustments - continue Bidil  ay current prescribed dose - off Coreg  given he had hypotension when he takes it  - he has f/u w/ nephrology next wk. They will check BMP  - he would not be a candidate for any advanced therapies given poor compliance and co morbidities    2. HTN - poorly controlled, elevated in clinic today  - suspect in large part d/t noncompliance w/ meds + untreated OSA - discussed importance of strict med compliance  - restart meds per above    3. 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 Tobacco Abuse - 30 year pack history  - encouraged to quit   5. Poor Med Compliance, Presenting Hazards to Health  - discussed how HTN is the silent killer  - has already had  a CVA w/ residual deficits. At risk for progression to ESRD, worsening HF and high risk for MI/SCD  - encouraged he work on improving compliance    Referred to HFSW (PCP, Medications, Transportation, ETOH Abuse, Drug Abuse, Insurance, Surveyor, Quantity ): No  Refer to Pharmacy:  No Refer to Home Health: No Refer to Advanced Heart Failure Clinic: No  Refer to General Cardiology: Yes (already established)   Follow up  w/ gen cards as planned in 2 wks   Lorae Roig Marcine, PA-C 01/06/2025      [1]  Allergies Allergen Reactions   Ibuprofen  Other (See Comments)    Avoid per PCP   Oxcarbazepine Other (See Comments)    Patient goes out of right state of mind.    "

## 2025-01-07 NOTE — Telephone Encounter (Signed)
 Further notes, BP readings and that pt went to the ED are in another encounter on 01/05/25

## 2025-01-11 ENCOUNTER — Telehealth: Payer: Self-pay | Admitting: Family Medicine

## 2025-01-12 NOTE — Telephone Encounter (Signed)
 Verbal given.

## 2025-01-19 ENCOUNTER — Ambulatory Visit: Admitting: Cardiology

## 2025-01-28 ENCOUNTER — Ambulatory Visit: Admitting: Family Medicine
